# Patient Record
Sex: Male | Born: 1955 | Race: Black or African American | Hispanic: No | Smoking: Former smoker
Health system: Southern US, Community
[De-identification: ages and names within clinical notes are randomized; demographics above are authoritative.]

## PROBLEM LIST (undated history)

## (undated) DIAGNOSIS — Z992 Dependence on renal dialysis: Secondary | ICD-10-CM

## (undated) DIAGNOSIS — E669 Obesity, unspecified: Secondary | ICD-10-CM

## (undated) DIAGNOSIS — E119 Type 2 diabetes mellitus without complications: Secondary | ICD-10-CM

## (undated) DIAGNOSIS — I10 Essential (primary) hypertension: Secondary | ICD-10-CM

## (undated) DIAGNOSIS — F32A Depression, unspecified: Secondary | ICD-10-CM

## (undated) DIAGNOSIS — H3581 Retinal edema: Secondary | ICD-10-CM

## (undated) DIAGNOSIS — M1990 Unspecified osteoarthritis, unspecified site: Secondary | ICD-10-CM

## (undated) DIAGNOSIS — I2510 Atherosclerotic heart disease of native coronary artery without angina pectoris: Secondary | ICD-10-CM

## (undated) DIAGNOSIS — J45909 Unspecified asthma, uncomplicated: Secondary | ICD-10-CM

## (undated) DIAGNOSIS — R51 Headache: Secondary | ICD-10-CM

## (undated) DIAGNOSIS — N186 End stage renal disease: Secondary | ICD-10-CM

## (undated) DIAGNOSIS — F419 Anxiety disorder, unspecified: Secondary | ICD-10-CM

## (undated) DIAGNOSIS — E785 Hyperlipidemia, unspecified: Secondary | ICD-10-CM

## (undated) DIAGNOSIS — I639 Cerebral infarction, unspecified: Secondary | ICD-10-CM

## (undated) DIAGNOSIS — G4730 Sleep apnea, unspecified: Secondary | ICD-10-CM

## (undated) DIAGNOSIS — I219 Acute myocardial infarction, unspecified: Secondary | ICD-10-CM

## (undated) DIAGNOSIS — F329 Major depressive disorder, single episode, unspecified: Secondary | ICD-10-CM

## (undated) DIAGNOSIS — E11319 Type 2 diabetes mellitus with unspecified diabetic retinopathy without macular edema: Secondary | ICD-10-CM

## (undated) DIAGNOSIS — I5022 Chronic systolic (congestive) heart failure: Secondary | ICD-10-CM

## (undated) DIAGNOSIS — N184 Chronic kidney disease, stage 4 (severe): Secondary | ICD-10-CM

## (undated) HISTORY — PX: CORONARY ANGIOPLASTY WITH STENT PLACEMENT: SHX49

## (undated) HISTORY — DX: Acute myocardial infarction, unspecified: I21.9

## (undated) HISTORY — DX: Essential (primary) hypertension: I10

## (undated) HISTORY — DX: Atherosclerotic heart disease of native coronary artery without angina pectoris: I25.10

## (undated) HISTORY — DX: Hyperlipidemia, unspecified: E78.5

## (undated) HISTORY — PX: UMBILICAL HERNIA REPAIR: SHX196

## (undated) HISTORY — PX: REFRACTIVE SURGERY: SHX103

## (undated) HISTORY — DX: Cerebral infarction, unspecified: I63.9

## (undated) HISTORY — PX: HERNIA REPAIR: SHX51

---

## 1998-04-12 ENCOUNTER — Encounter: Admission: RE | Admit: 1998-04-12 | Discharge: 1998-07-11 | Payer: Self-pay | Admitting: Nephrology

## 1998-06-22 ENCOUNTER — Encounter: Payer: Self-pay | Admitting: Family Medicine

## 1998-06-22 ENCOUNTER — Inpatient Hospital Stay (HOSPITAL_COMMUNITY): Admission: EM | Admit: 1998-06-22 | Discharge: 1998-06-24 | Payer: Self-pay | Admitting: Emergency Medicine

## 1998-06-29 ENCOUNTER — Encounter: Admission: RE | Admit: 1998-06-29 | Discharge: 1998-06-29 | Payer: Self-pay | Admitting: Family Medicine

## 1998-07-14 ENCOUNTER — Encounter: Admission: RE | Admit: 1998-07-14 | Discharge: 1998-10-12 | Payer: Self-pay | Admitting: Nephrology

## 2002-05-21 ENCOUNTER — Ambulatory Visit (HOSPITAL_BASED_OUTPATIENT_CLINIC_OR_DEPARTMENT_OTHER): Admission: RE | Admit: 2002-05-21 | Discharge: 2002-05-21 | Payer: Self-pay | Admitting: Emergency Medicine

## 2004-01-23 ENCOUNTER — Inpatient Hospital Stay (HOSPITAL_COMMUNITY): Admission: EM | Admit: 2004-01-23 | Discharge: 2004-01-29 | Payer: Self-pay | Admitting: Emergency Medicine

## 2004-01-24 HISTORY — PX: CORONARY ANGIOPLASTY WITH STENT PLACEMENT: SHX49

## 2004-01-28 HISTORY — PX: CORONARY ANGIOPLASTY WITH STENT PLACEMENT: SHX49

## 2004-11-17 ENCOUNTER — Emergency Department (HOSPITAL_COMMUNITY): Admission: EM | Admit: 2004-11-17 | Discharge: 2004-11-17 | Payer: Self-pay | Admitting: Family Medicine

## 2004-12-14 ENCOUNTER — Emergency Department (HOSPITAL_COMMUNITY): Admission: EM | Admit: 2004-12-14 | Discharge: 2004-12-14 | Payer: Self-pay | Admitting: Family Medicine

## 2006-06-10 ENCOUNTER — Emergency Department (HOSPITAL_COMMUNITY): Admission: EM | Admit: 2006-06-10 | Discharge: 2006-06-10 | Payer: Self-pay | Admitting: Emergency Medicine

## 2006-06-20 ENCOUNTER — Emergency Department (HOSPITAL_COMMUNITY): Admission: EM | Admit: 2006-06-20 | Discharge: 2006-06-20 | Payer: Self-pay | Admitting: Family Medicine

## 2006-12-12 ENCOUNTER — Ambulatory Visit (HOSPITAL_COMMUNITY): Admission: RE | Admit: 2006-12-12 | Discharge: 2006-12-12 | Payer: Self-pay | Admitting: Nephrology

## 2007-05-19 ENCOUNTER — Ambulatory Visit: Payer: Self-pay | Admitting: Family Medicine

## 2007-05-19 ENCOUNTER — Inpatient Hospital Stay (HOSPITAL_COMMUNITY): Admission: EM | Admit: 2007-05-19 | Discharge: 2007-05-21 | Payer: Self-pay | Admitting: Emergency Medicine

## 2007-05-20 ENCOUNTER — Encounter: Payer: Self-pay | Admitting: Family Medicine

## 2007-10-06 ENCOUNTER — Ambulatory Visit: Payer: Self-pay | Admitting: Family Medicine

## 2007-10-06 ENCOUNTER — Observation Stay (HOSPITAL_COMMUNITY): Admission: EM | Admit: 2007-10-06 | Discharge: 2007-10-08 | Payer: Self-pay | Admitting: Family Medicine

## 2007-12-19 ENCOUNTER — Ambulatory Visit (HOSPITAL_COMMUNITY): Admission: RE | Admit: 2007-12-19 | Discharge: 2007-12-19 | Payer: Self-pay | Admitting: Gastroenterology

## 2008-01-12 ENCOUNTER — Ambulatory Visit (HOSPITAL_COMMUNITY): Admission: RE | Admit: 2008-01-12 | Discharge: 2008-01-12 | Payer: Self-pay | Admitting: General Surgery

## 2008-09-13 ENCOUNTER — Observation Stay (HOSPITAL_COMMUNITY): Admission: EM | Admit: 2008-09-13 | Discharge: 2008-09-14 | Payer: Self-pay | Admitting: Emergency Medicine

## 2008-09-13 ENCOUNTER — Ambulatory Visit: Payer: Self-pay | Admitting: Family Medicine

## 2008-09-21 ENCOUNTER — Encounter: Payer: Self-pay | Admitting: *Deleted

## 2008-09-21 ENCOUNTER — Ambulatory Visit: Payer: Self-pay | Admitting: Family Medicine

## 2008-09-21 DIAGNOSIS — I10 Essential (primary) hypertension: Secondary | ICD-10-CM | POA: Insufficient documentation

## 2008-09-21 DIAGNOSIS — F528 Other sexual dysfunction not due to a substance or known physiological condition: Secondary | ICD-10-CM

## 2008-09-21 DIAGNOSIS — I2510 Atherosclerotic heart disease of native coronary artery without angina pectoris: Secondary | ICD-10-CM

## 2008-09-21 DIAGNOSIS — E785 Hyperlipidemia, unspecified: Secondary | ICD-10-CM

## 2008-09-21 DIAGNOSIS — G4733 Obstructive sleep apnea (adult) (pediatric): Secondary | ICD-10-CM

## 2008-09-21 DIAGNOSIS — E118 Type 2 diabetes mellitus with unspecified complications: Secondary | ICD-10-CM | POA: Insufficient documentation

## 2008-09-21 DIAGNOSIS — J13 Pneumonia due to Streptococcus pneumoniae: Secondary | ICD-10-CM | POA: Insufficient documentation

## 2008-09-21 DIAGNOSIS — I5042 Chronic combined systolic (congestive) and diastolic (congestive) heart failure: Secondary | ICD-10-CM | POA: Insufficient documentation

## 2008-09-21 DIAGNOSIS — I5022 Chronic systolic (congestive) heart failure: Secondary | ICD-10-CM

## 2008-09-21 HISTORY — DX: Chronic systolic (congestive) heart failure: I50.22

## 2009-01-12 IMAGING — CR DG CHEST 2V
3 series · 3 of 3 positions shown · non-contrast
Comparison: 06/20/06.

CLINICAL DATA: Stroke, weakness. 
 CHEST ? 2 VIEW:

[view not recorded (1 of 3)]
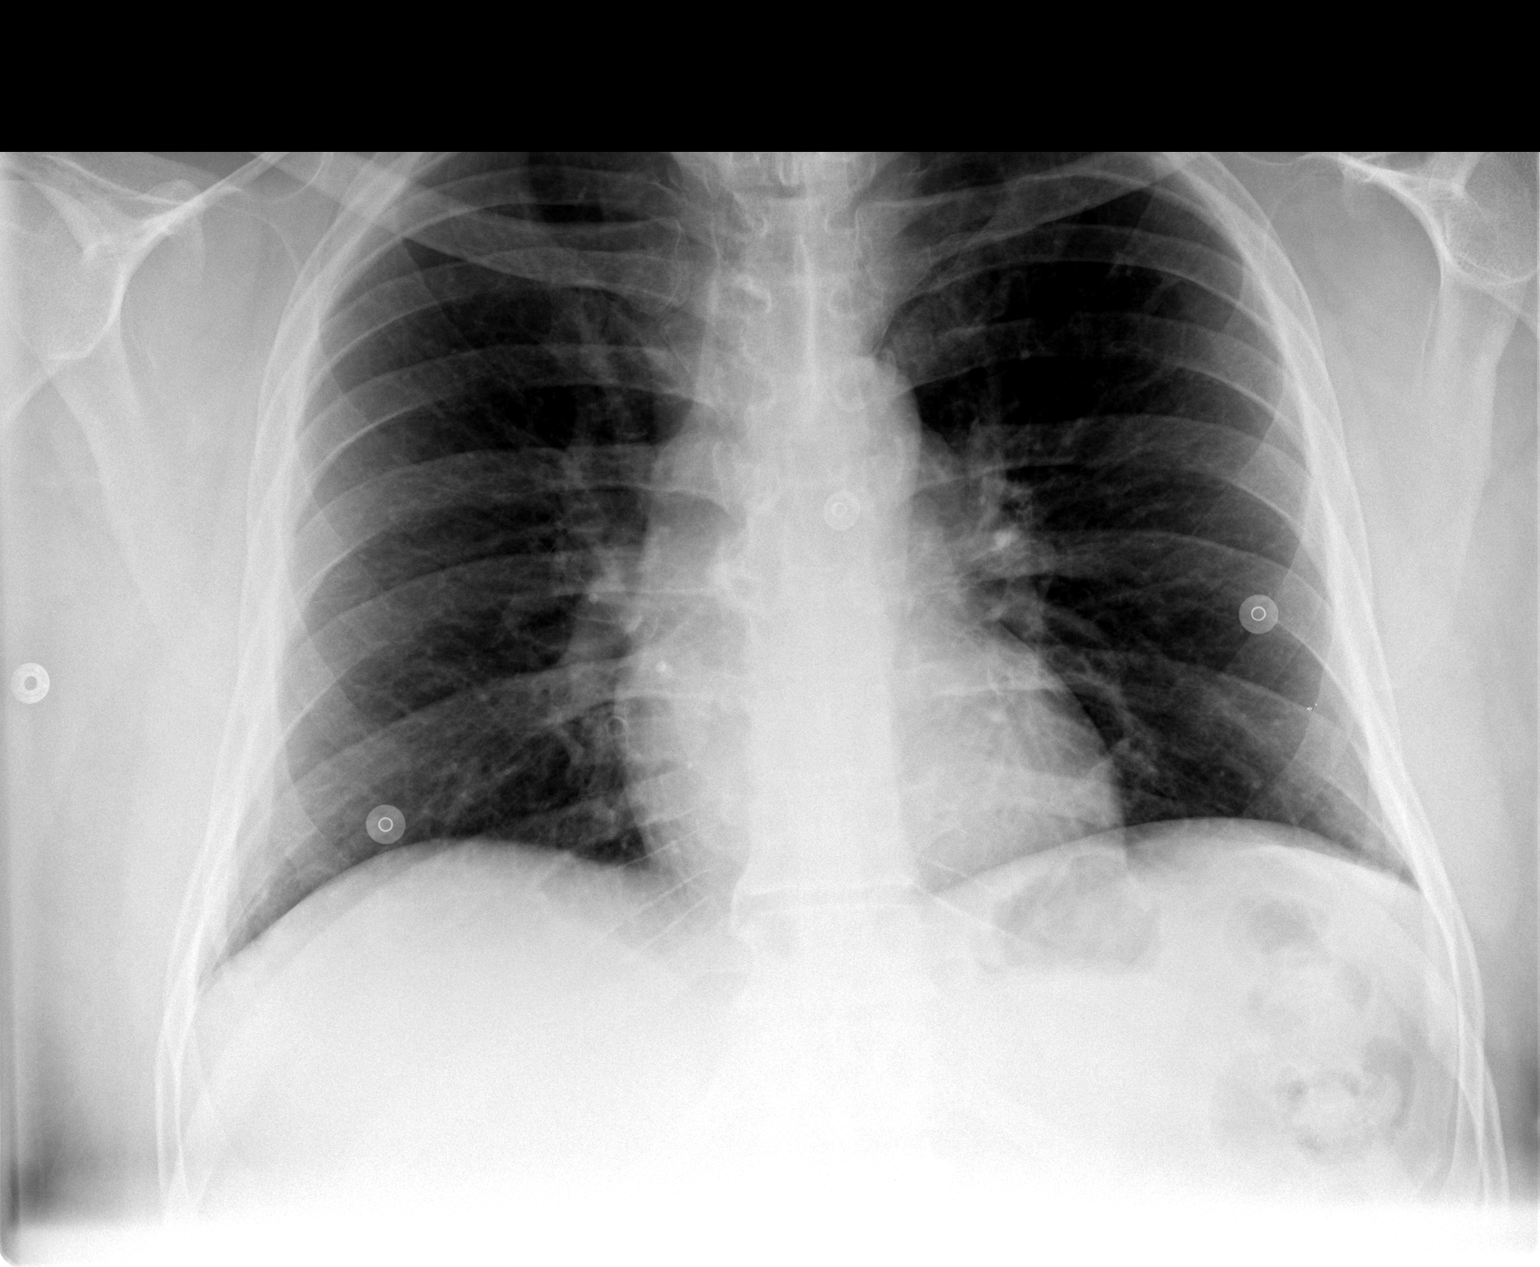

[view not recorded (2 of 3)]
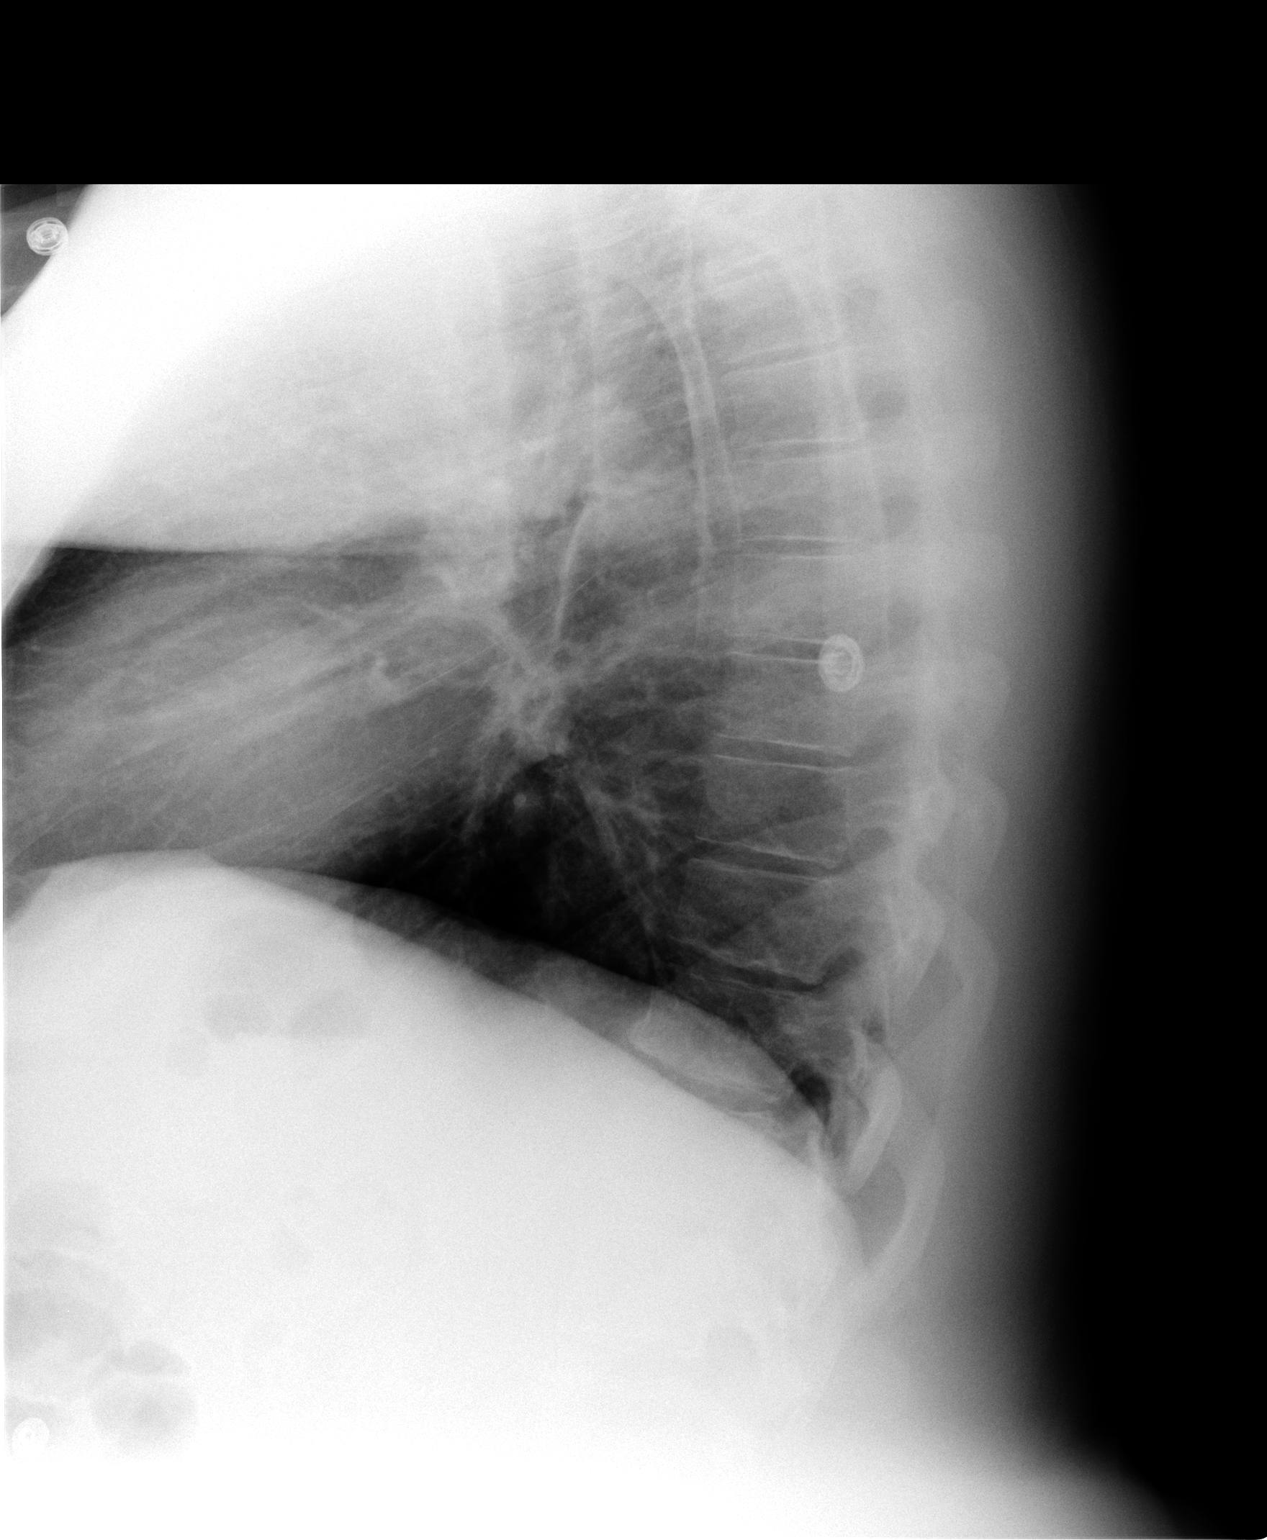

[view not recorded (3 of 3)]
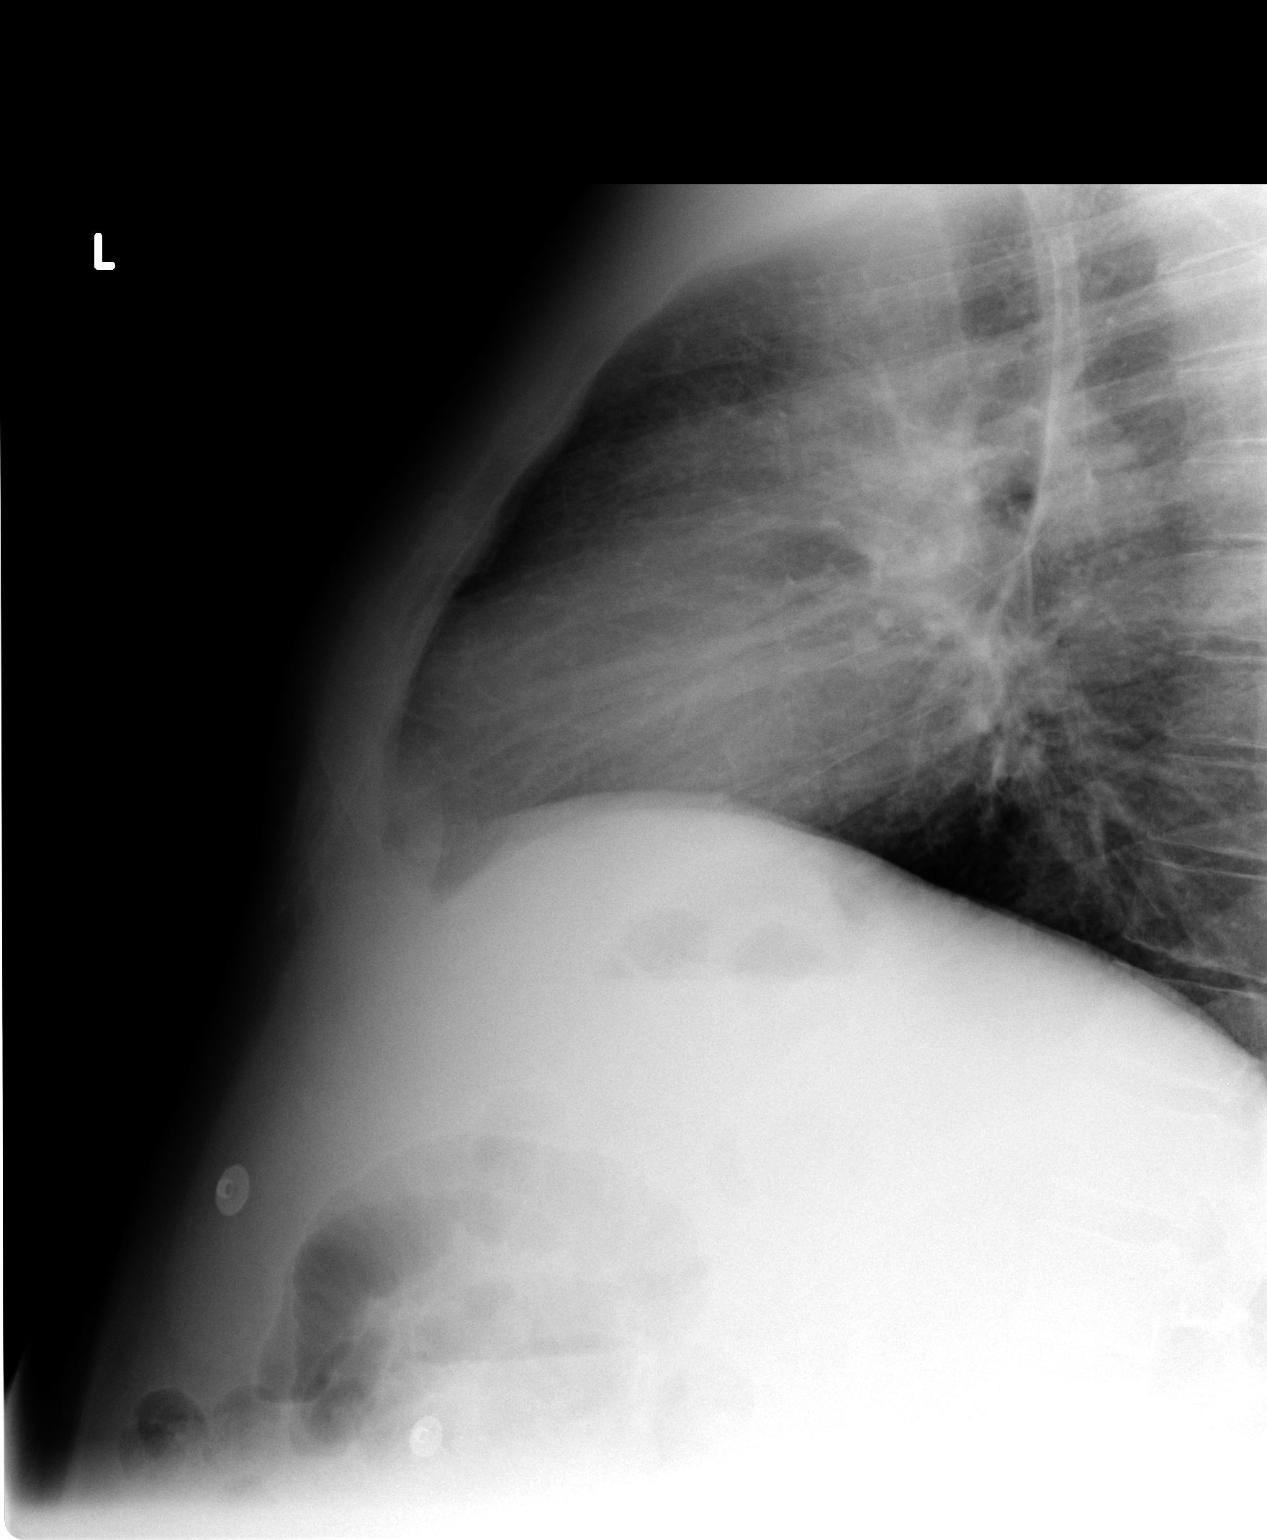

[3 of 3 positions shown; findings below may reference images not displayed]

FINDINGS: The heart size and mediastinal contours are within normal limits.  Both lungs are clear.  The visualized skeletal structures are unremarkable.
IMPRESSION: No active cardiopulmonary disease.

## 2009-04-11 ENCOUNTER — Inpatient Hospital Stay (HOSPITAL_COMMUNITY): Admission: EM | Admit: 2009-04-11 | Discharge: 2009-04-15 | Payer: Self-pay | Admitting: Emergency Medicine

## 2009-04-11 ENCOUNTER — Emergency Department (HOSPITAL_COMMUNITY): Admission: EM | Admit: 2009-04-11 | Discharge: 2009-04-11 | Payer: Self-pay | Admitting: Family Medicine

## 2009-04-11 ENCOUNTER — Encounter: Payer: Self-pay | Admitting: Family Medicine

## 2009-04-11 ENCOUNTER — Ambulatory Visit: Payer: Self-pay | Admitting: Family Medicine

## 2009-04-11 DIAGNOSIS — I6350 Cerebral infarction due to unspecified occlusion or stenosis of unspecified cerebral artery: Secondary | ICD-10-CM | POA: Insufficient documentation

## 2009-04-11 DIAGNOSIS — R519 Headache, unspecified: Secondary | ICD-10-CM | POA: Insufficient documentation

## 2009-04-11 DIAGNOSIS — H538 Other visual disturbances: Secondary | ICD-10-CM

## 2009-04-11 DIAGNOSIS — R51 Headache: Secondary | ICD-10-CM

## 2009-04-12 ENCOUNTER — Encounter: Payer: Self-pay | Admitting: Family Medicine

## 2009-04-14 ENCOUNTER — Encounter: Payer: Self-pay | Admitting: Cardiovascular Disease

## 2009-04-25 ENCOUNTER — Ambulatory Visit: Payer: Self-pay | Admitting: Family Medicine

## 2009-05-10 ENCOUNTER — Ambulatory Visit: Payer: Self-pay | Admitting: Family Medicine

## 2009-05-10 ENCOUNTER — Encounter: Payer: Self-pay | Admitting: Family Medicine

## 2009-05-12 LAB — CONVERTED CEMR LAB
AST: 14 units/L (ref 0–37)
Alkaline Phosphatase: 68 units/L (ref 39–117)
BUN: 26 mg/dL — ABNORMAL HIGH (ref 6–23)
Calcium: 9.9 mg/dL (ref 8.4–10.5)
Chloride: 100 meq/L (ref 96–112)
Creatinine, Ser: 1.97 mg/dL — ABNORMAL HIGH (ref 0.40–1.50)
HDL: 43 mg/dL (ref >39)
Total CHOL/HDL Ratio: 6.9

## 2009-06-07 ENCOUNTER — Encounter: Payer: Self-pay | Admitting: Family Medicine

## 2009-06-07 ENCOUNTER — Ambulatory Visit: Payer: Self-pay | Admitting: Family Medicine

## 2009-06-07 DIAGNOSIS — R471 Dysarthria and anarthria: Secondary | ICD-10-CM

## 2009-06-09 ENCOUNTER — Telehealth: Payer: Self-pay | Admitting: Family Medicine

## 2009-06-09 LAB — CONVERTED CEMR LAB
Calcium: 9.9 mg/dL (ref 8.4–10.5)
Glucose, Bld: 233 mg/dL — ABNORMAL HIGH (ref 70–99)
Sodium: 142 meq/L (ref 135–145)

## 2009-06-16 ENCOUNTER — Ambulatory Visit: Payer: Self-pay | Admitting: Family Medicine

## 2009-07-07 ENCOUNTER — Ambulatory Visit: Payer: Self-pay | Admitting: Family Medicine

## 2009-07-11 ENCOUNTER — Ambulatory Visit: Payer: Self-pay | Admitting: Family Medicine

## 2009-07-11 ENCOUNTER — Encounter: Payer: Self-pay | Admitting: Family Medicine

## 2009-07-11 LAB — CONVERTED CEMR LAB
ALT: 15 units/L (ref 0–53)
AST: 13 units/L (ref 0–37)
Alkaline Phosphatase: 64 units/L (ref 39–117)
CO2: 24 meq/L (ref 19–32)
Sodium: 140 meq/L (ref 135–145)
Total Bilirubin: 0.4 mg/dL (ref 0.3–1.2)
Total Protein: 6.9 g/dL (ref 6.0–8.3)

## 2009-07-14 ENCOUNTER — Encounter: Admission: RE | Admit: 2009-07-14 | Discharge: 2009-09-20 | Payer: Self-pay | Admitting: Family Medicine

## 2009-07-15 ENCOUNTER — Telehealth: Payer: Self-pay | Admitting: *Deleted

## 2009-08-02 ENCOUNTER — Ambulatory Visit: Payer: Self-pay | Admitting: Family Medicine

## 2009-08-02 ENCOUNTER — Encounter: Payer: Self-pay | Admitting: Family Medicine

## 2009-08-02 DIAGNOSIS — M25519 Pain in unspecified shoulder: Secondary | ICD-10-CM

## 2009-08-04 LAB — CONVERTED CEMR LAB
BUN: 22 mg/dL (ref 6–23)
Calcium: 10 mg/dL (ref 8.4–10.5)
Creatinine, Ser: 1.85 mg/dL — ABNORMAL HIGH (ref 0.40–1.50)
Glucose, Bld: 258 mg/dL — ABNORMAL HIGH (ref 70–99)
Sodium: 138 meq/L (ref 135–145)

## 2009-08-30 ENCOUNTER — Ambulatory Visit: Payer: Self-pay | Admitting: Family Medicine

## 2009-08-30 ENCOUNTER — Encounter: Payer: Self-pay | Admitting: Family Medicine

## 2009-08-30 DIAGNOSIS — Z8679 Personal history of other diseases of the circulatory system: Secondary | ICD-10-CM | POA: Insufficient documentation

## 2009-08-30 DIAGNOSIS — N184 Chronic kidney disease, stage 4 (severe): Secondary | ICD-10-CM

## 2009-08-30 DIAGNOSIS — N186 End stage renal disease: Secondary | ICD-10-CM | POA: Insufficient documentation

## 2009-08-30 DIAGNOSIS — Z992 Dependence on renal dialysis: Secondary | ICD-10-CM

## 2009-08-30 DIAGNOSIS — I639 Cerebral infarction, unspecified: Secondary | ICD-10-CM | POA: Insufficient documentation

## 2009-08-30 HISTORY — DX: Chronic kidney disease, stage 4 (severe): N18.4

## 2009-08-30 LAB — CONVERTED CEMR LAB: Hgb A1c MFr Bld: 11.4 %

## 2009-09-01 LAB — CONVERTED CEMR LAB
Calcium: 9.3 mg/dL (ref 8.4–10.5)
Creatinine, Ser: 1.97 mg/dL — ABNORMAL HIGH (ref 0.40–1.50)

## 2009-09-06 ENCOUNTER — Ambulatory Visit: Payer: Self-pay | Admitting: Family Medicine

## 2009-09-06 ENCOUNTER — Telehealth: Payer: Self-pay | Admitting: Family Medicine

## 2009-09-29 ENCOUNTER — Encounter: Payer: Self-pay | Admitting: Family Medicine

## 2009-10-11 ENCOUNTER — Ambulatory Visit: Payer: Self-pay | Admitting: Family Medicine

## 2009-10-12 ENCOUNTER — Telehealth (INDEPENDENT_AMBULATORY_CARE_PROVIDER_SITE_OTHER): Payer: Self-pay | Admitting: *Deleted

## 2009-10-28 ENCOUNTER — Ambulatory Visit: Payer: Self-pay | Admitting: Family Medicine

## 2009-10-28 ENCOUNTER — Telehealth: Payer: Self-pay | Admitting: Family Medicine

## 2009-10-28 ENCOUNTER — Encounter: Payer: Self-pay | Admitting: Family Medicine

## 2009-10-29 LAB — CONVERTED CEMR LAB
Chloride: 108 meq/L (ref 96–112)
Potassium: 3.9 meq/L (ref 3.5–5.3)
Sodium: 142 meq/L (ref 135–145)

## 2009-10-31 ENCOUNTER — Encounter: Payer: Self-pay | Admitting: Family Medicine

## 2009-10-31 ENCOUNTER — Ambulatory Visit: Payer: Self-pay | Admitting: Family Medicine

## 2009-11-01 LAB — CONVERTED CEMR LAB
AST: 16 units/L (ref 0–37)
Albumin: 3.6 g/dL (ref 3.5–5.2)
BUN: 33 mg/dL — ABNORMAL HIGH (ref 6–23)
Calcium: 9.2 mg/dL (ref 8.4–10.5)
Chloride: 102 meq/L (ref 96–112)
Glucose, Bld: 174 mg/dL — ABNORMAL HIGH (ref 70–99)
HDL: 46 mg/dL (ref >39)
Potassium: 3.6 meq/L (ref 3.5–5.3)
Sodium: 140 meq/L (ref 135–145)
Total Protein: 7.1 g/dL (ref 6.0–8.3)
Triglycerides: 289 mg/dL — ABNORMAL HIGH (ref <150)

## 2009-11-03 ENCOUNTER — Ambulatory Visit: Payer: Self-pay | Admitting: Family Medicine

## 2009-11-03 ENCOUNTER — Encounter: Payer: Self-pay | Admitting: Family Medicine

## 2009-11-04 LAB — CONVERTED CEMR LAB
CO2: 28 meq/L (ref 19–32)
Calcium: 9.2 mg/dL (ref 8.4–10.5)
Chloride: 106 meq/L (ref 96–112)
Glucose, Bld: 115 mg/dL — ABNORMAL HIGH (ref 70–99)
Sodium: 142 meq/L (ref 135–145)

## 2009-11-07 ENCOUNTER — Ambulatory Visit: Payer: Self-pay | Admitting: Family Medicine

## 2009-11-07 ENCOUNTER — Encounter: Payer: Self-pay | Admitting: Family Medicine

## 2009-11-08 LAB — CONVERTED CEMR LAB
BUN: 20 mg/dL (ref 6–23)
CO2: 28 meq/L (ref 19–32)
Calcium: 9.4 mg/dL (ref 8.4–10.5)
Chloride: 104 meq/L (ref 96–112)
Creatinine, Ser: 1.68 mg/dL — ABNORMAL HIGH (ref 0.40–1.50)

## 2009-11-11 ENCOUNTER — Encounter: Payer: Self-pay | Admitting: Family Medicine

## 2009-11-11 ENCOUNTER — Telehealth: Payer: Self-pay | Admitting: Family Medicine

## 2009-11-11 ENCOUNTER — Ambulatory Visit: Payer: Self-pay | Admitting: Family Medicine

## 2009-11-11 LAB — CONVERTED CEMR LAB
BUN: 30 mg/dL — ABNORMAL HIGH (ref 6–23)
Calcium: 10.2 mg/dL (ref 8.4–10.5)
Chloride: 100 meq/L (ref 96–112)
Creatinine, Ser: 1.63 mg/dL — ABNORMAL HIGH (ref 0.40–1.50)

## 2010-01-09 ENCOUNTER — Encounter: Payer: Self-pay | Admitting: Family Medicine

## 2010-01-09 ENCOUNTER — Ambulatory Visit: Payer: Self-pay | Admitting: Family Medicine

## 2010-01-09 DIAGNOSIS — L600 Ingrowing nail: Secondary | ICD-10-CM | POA: Insufficient documentation

## 2010-01-09 DIAGNOSIS — E1149 Type 2 diabetes mellitus with other diabetic neurological complication: Secondary | ICD-10-CM

## 2010-01-09 LAB — CONVERTED CEMR LAB
ALT: 19 units/L (ref 0–53)
AST: 15 units/L (ref 0–37)
Albumin: 4 g/dL (ref 3.5–5.2)
Alkaline Phosphatase: 75 units/L (ref 39–117)
Glucose, Bld: 112 mg/dL — ABNORMAL HIGH (ref 70–99)
Hgb A1c MFr Bld: 9 %
Potassium: 4.1 meq/L (ref 3.5–5.3)
Sodium: 141 meq/L (ref 135–145)
Total Bilirubin: 0.3 mg/dL (ref 0.3–1.2)
Total Protein: 7.7 g/dL (ref 6.0–8.3)

## 2010-01-16 ENCOUNTER — Emergency Department (HOSPITAL_COMMUNITY): Admission: EM | Admit: 2010-01-16 | Discharge: 2010-01-16 | Payer: Self-pay | Admitting: Emergency Medicine

## 2010-01-19 ENCOUNTER — Encounter: Payer: Self-pay | Admitting: Family Medicine

## 2010-01-23 ENCOUNTER — Ambulatory Visit: Payer: Self-pay | Admitting: Family Medicine

## 2010-02-03 ENCOUNTER — Encounter: Payer: Self-pay | Admitting: Family Medicine

## 2010-02-07 ENCOUNTER — Inpatient Hospital Stay (HOSPITAL_COMMUNITY): Admission: EM | Admit: 2010-02-07 | Discharge: 2010-02-16 | Payer: Self-pay | Admitting: Emergency Medicine

## 2010-02-07 ENCOUNTER — Ambulatory Visit: Payer: Self-pay | Admitting: Family Medicine

## 2010-02-07 ENCOUNTER — Emergency Department (HOSPITAL_COMMUNITY): Admission: EM | Admit: 2010-02-07 | Discharge: 2010-02-07 | Payer: Self-pay | Admitting: Family Medicine

## 2010-02-16 ENCOUNTER — Telehealth: Payer: Self-pay | Admitting: *Deleted

## 2010-02-21 ENCOUNTER — Encounter: Payer: Self-pay | Admitting: Family Medicine

## 2010-02-22 ENCOUNTER — Encounter: Payer: Self-pay | Admitting: Family Medicine

## 2010-02-22 ENCOUNTER — Ambulatory Visit: Payer: Self-pay | Admitting: Family Medicine

## 2010-02-23 ENCOUNTER — Encounter: Payer: Self-pay | Admitting: Family Medicine

## 2010-03-13 ENCOUNTER — Encounter: Payer: Self-pay | Admitting: Family Medicine

## 2010-03-17 ENCOUNTER — Telehealth: Payer: Self-pay | Admitting: Family Medicine

## 2010-03-31 ENCOUNTER — Encounter: Payer: Self-pay | Admitting: Family Medicine

## 2010-04-10 ENCOUNTER — Encounter: Payer: Self-pay | Admitting: Family Medicine

## 2010-04-10 ENCOUNTER — Ambulatory Visit (HOSPITAL_BASED_OUTPATIENT_CLINIC_OR_DEPARTMENT_OTHER): Admission: RE | Admit: 2010-04-10 | Discharge: 2010-04-10 | Payer: Self-pay | Admitting: Family Medicine

## 2010-04-15 ENCOUNTER — Ambulatory Visit: Payer: Self-pay | Admitting: Internal Medicine

## 2010-05-31 ENCOUNTER — Telehealth: Payer: Self-pay | Admitting: Family Medicine

## 2010-06-26 ENCOUNTER — Encounter: Payer: Self-pay | Admitting: Family Medicine

## 2010-06-26 ENCOUNTER — Ambulatory Visit: Payer: Self-pay | Admitting: Family Medicine

## 2010-06-26 DIAGNOSIS — R0602 Shortness of breath: Secondary | ICD-10-CM

## 2010-06-26 DIAGNOSIS — R42 Dizziness and giddiness: Secondary | ICD-10-CM

## 2010-06-26 LAB — CONVERTED CEMR LAB
BUN: 52 mg/dL — ABNORMAL HIGH (ref 6–23)
CO2: 25 meq/L (ref 19–32)
Calcium: 9.7 mg/dL (ref 8.4–10.5)
Creatinine, Ser: 3.37 mg/dL — ABNORMAL HIGH (ref 0.40–1.50)
Glucose, Bld: 458 mg/dL — ABNORMAL HIGH (ref 70–99)
Pro B Natriuretic peptide (BNP): 3.4 pg/mL (ref 0.0–100.0)
Sodium: 130 meq/L — ABNORMAL LOW (ref 135–145)

## 2010-06-30 ENCOUNTER — Encounter: Payer: Self-pay | Admitting: Family Medicine

## 2010-06-30 LAB — CONVERTED CEMR LAB
BUN: 37 mg/dL — ABNORMAL HIGH (ref 6–23)
Calcium: 10.2 mg/dL (ref 8.4–10.5)
Chloride: 100 meq/L (ref 96–112)
Creatinine, Ser: 2.16 mg/dL — ABNORMAL HIGH (ref 0.40–1.50)

## 2010-07-06 ENCOUNTER — Encounter: Payer: Self-pay | Admitting: Family Medicine

## 2010-07-10 ENCOUNTER — Ambulatory Visit: Payer: Self-pay | Admitting: Family Medicine

## 2010-07-14 ENCOUNTER — Encounter: Payer: Self-pay | Admitting: Family Medicine

## 2010-07-26 ENCOUNTER — Telehealth: Payer: Self-pay | Admitting: Family Medicine

## 2010-08-02 ENCOUNTER — Telehealth: Payer: Self-pay | Admitting: Family Medicine

## 2010-08-09 ENCOUNTER — Encounter: Payer: Self-pay | Admitting: Family Medicine

## 2010-08-09 ENCOUNTER — Ambulatory Visit: Payer: Self-pay | Admitting: Family Medicine

## 2010-08-09 LAB — CONVERTED CEMR LAB
BUN: 16 mg/dL (ref 6–23)
Chloride: 107 meq/L (ref 96–112)
Potassium: 3.8 meq/L (ref 3.5–5.3)
Pro B Natriuretic peptide (BNP): 29.6 pg/mL (ref 0.0–100.0)

## 2010-08-10 ENCOUNTER — Encounter: Payer: Self-pay | Admitting: Internal Medicine

## 2010-08-10 ENCOUNTER — Ambulatory Visit: Payer: Self-pay | Admitting: Family Medicine

## 2010-08-11 ENCOUNTER — Ambulatory Visit: Payer: Self-pay | Admitting: Family Medicine

## 2010-08-14 ENCOUNTER — Ambulatory Visit: Payer: Self-pay | Admitting: Family Medicine

## 2010-08-14 ENCOUNTER — Encounter: Payer: Self-pay | Admitting: Family Medicine

## 2010-08-14 LAB — CONVERTED CEMR LAB
CO2: 27 meq/L (ref 19–32)
Calcium: 9.2 mg/dL (ref 8.4–10.5)
Potassium: 3.8 meq/L (ref 3.5–5.3)
Sodium: 138 meq/L (ref 135–145)

## 2010-08-30 ENCOUNTER — Encounter: Payer: Self-pay | Admitting: Family Medicine

## 2010-08-30 ENCOUNTER — Ambulatory Visit: Payer: Self-pay | Admitting: Family Medicine

## 2010-09-01 LAB — CONVERTED CEMR LAB
CO2: 24 meq/L (ref 19–32)
Chloride: 99 meq/L (ref 96–112)
Glucose, Bld: 331 mg/dL — ABNORMAL HIGH (ref 70–99)
Sodium: 136 meq/L (ref 135–145)

## 2010-10-05 ENCOUNTER — Encounter: Payer: Self-pay | Admitting: Family Medicine

## 2010-10-24 NOTE — Assessment & Plan Note (Signed)
Summary: f/u visit/b mc   Vital Signs:  Patient profile:   55 year old male Height:      70 inches Weight:      310 pounds BMI:     44.64 Temp:     98.1 degrees F oral Pulse rate:   102 / minute BP sitting:   135 / 79  (left arm) Cuff size:   large  Vitals Entered By: Schuyler Amor CMA (Jan 23, 2010 4:06 PM) CC: F/U HTN, CHF  Is Patient Diabetic? Yes Pain Assessment Patient in pain? no        Primary Care Provider:  Mariana Arn  MD  CC:  F/U HTN and CHF .  History of Present Illness: 1) CHF: Systolic. Was on torsemide, Coreg, Lisinopril, spironolactone at last visit, also started on hydralazine and isosorbide dinitrate. His cardiologist Dr. Georgina Peer at [facility] cut torsemide dose in half and stopped spironolactone due to elevated Cr at [facility]. Here at [facility] Cr was 2.01 in April (up from 1.63 in February 2011).  Reports dyspnea and orthopnea are at baseline, but has gained 10 lbs in past two weeks, reports increasing abdominal girth and fluid. Continues to not monitor salt intake or fluid intake. (was given recommendations for 1500 mg sodium, 1.5 liters fluid). Reports continued LE edema as well.    2) HTN: 135 / 26 today. Creatinine increased as above. Denies chest pain. Reports dyspnea, LE edema as above. Has cut back on fried foods, increased vegetables (now up to three per day). Does not watch salt.   Habits & Providers  Alcohol-Tobacco-Diet     Tobacco Status: quit  Current Medications (verified): 1)  Plavix 75 Mg Tabs (Clopidogrel Bisulfate) .... One Tablet By Mouth Dialy 2)  Torsemide 100 Mg Tabs (Torsemide) .... One Tabs Each Morning By Mouth 3)  Crestor 20 Mg Tabs (Rosuvastatin Calcium) .... One Tablet By Mouth Daily 4)  Glipizide 5 Mg Tabs (Glipizide) .... One Tab By Mouth Two Times A Day 5)  Lisinopril 40 Mg Tabs (Lisinopril) .... Take One Tablet By Mouth Daily 6)  Coreg 25 Mg Tabs (Carvedilol) .... One Tab By Mouth Two Times A Day 7)  Lantus 100 Unit/ml Soln (Insulin  Glargine) .... 70 Units Twice Daily.  Then Increase By 1 Unit in The Am and Pm If Morning Cbgs Is Still Higher Than 100.  Dispense 1 Month Supply Please 8)  Bd Insulin Syringe Ultrafine 30g X 1/2" 1 Ml Misc (Insulin Syringe-Needle U-100) .... Dispense One Month Supply For Once Daily Dosing.  Dispense 1 Box. 9)  Isosorbide Dinitrate 20 Mg Tabs (Isosorbide Dinitrate) .... One Tab By Mouth Three Times A Day 10)  Hydralazine Hcl 25 Mg Tabs (Hydralazine Hcl) .... One Tab By Mouth Three Times A Day 11)  Gabapentin 300 Mg Caps (Gabapentin) .... One Tab By Mouth Three Times A Day  Allergies: No Known Drug Allergies  Social History: Smoking Status:  quit  Physical Exam  General:  Obese, no acute distress. Not in extremis. VS reviewed. Hypertensive. Weight is up 10 lbs since last visit.  Lungs:  work of breathing mildly labored with movement. Unlabored at rest, clear to auscultation bilaterally; no wheezes, rales, or ronchi; good air movement throughout. specifically no rales BLL Heart:  Normal rate and regular rhythm. S1 and S2 normal without gallop, murmur, click, rub or other extra sounds. Abdomen:  quite distended, tight; no tenderness with palpation Pulses:  2+ pedals Extremities:  1+ pitting edema to mid shins  Neurologic:  alert & oriented X3.  cranial nerves II-XII intact.  sensation intact bilateral upper and lower extremities    Impression & Recommendations:  Problem # 1:  CONGESTIVE HEART FAILURE, SYSTOLIC DYSFUNCTION ([id number]) Assessment Deteriorated  Continue hydralazine and isosorbide dinitrate as below. Blood pressures approaching. Continue ACE, BBlocker. Will continue with half dose Torsemide. Reiterated need for tight fluid control, monitoring of salt intake; patient has been making little to no effort in this regard. Will contact patient's cardiologist regarding managing his medications. Plan is for patient to have labs at cardiology this week.    The following medications  were removed from the medication list:    Spironolactone 25 Mg Tabs (Spironolactone) ..... One tab by mouth qday His updated medication list for this problem includes:    Plavix 75 Mg Tabs (Clopidogrel bisulfate) ..... One tablet by mouth dialy    Torsemide 100 Mg Tabs (Torsemide) ..... One tabs each morning by mouth    Lisinopril 40 Mg Tabs (Lisinopril) .Marland Kitchen... Take one tablet by mouth daily    Coreg 25 Mg Tabs (Carvedilol) ..... One tab by mouth two times a day  Orders: [place]- Est  Level 4 [id number])  Problem # 2:  ESSENTIAL HYPERTENSION (ICD-401.9) Assessment: Unchanged  Readdressed need for DASH diet, exercise. Pressures much improved with starting hydralazine, isosorbide dinitrate. Will continue these. Will hold spironolactone, 1/2 dose torsemid as above.   The following medications were removed from the medication list:    Spironolactone 25 Mg Tabs (Spironolactone) ..... One tab by mouth qday His updated medication list for this problem includes:    Torsemide 100 Mg Tabs (Torsemide) ..... One tabs each morning by mouth    Lisinopril 40 Mg Tabs (Lisinopril) .Marland Kitchen... Take one tablet by mouth daily    Coreg 25 Mg Tabs (Carvedilol) ..... One tab by mouth two times a day    Hydralazine Hcl 25 Mg Tabs (Hydralazine hcl) ..... One tab by mouth three times a day  Orders: [place]- Est  Level 4 [id number])  Complete Medication List: 1)  Plavix 75 Mg Tabs (Clopidogrel bisulfate) .... One tablet by mouth dialy 2)  Torsemide 100 Mg Tabs (Torsemide) .... One tabs each morning by mouth 3)  Crestor 20 Mg Tabs (Rosuvastatin calcium) .... One tablet by mouth daily 4)  Glipizide 5 Mg Tabs (Glipizide) .... One tab by mouth two times a day 5)  Lisinopril 40 Mg Tabs (Lisinopril) .... Take one tablet by mouth daily 6)  Coreg 25 Mg Tabs (Carvedilol) .... One tab by mouth two times a day 7)  Lantus 100 Unit/ml Soln (Insulin glargine) .... 70 units twice daily.  then increase by 1 unit in the am and pm if morning cbgs is  still higher than 100.  dispense 1 month supply please 8)  Bd Insulin Syringe Ultrafine 30g X 1/2" 1 Ml Misc (Insulin syringe-needle u-100) .... Dispense one month supply for once daily dosing.  dispense 1 box. 9)  Isosorbide Dinitrate 20 Mg Tabs (Isosorbide dinitrate) .... One tab by mouth three times a day 10)  Hydralazine Hcl 25 Mg Tabs (Hydralazine hcl) .... One tab by mouth three times a day 11)  Gabapentin 300 Mg Caps (Gabapentin) .... One tab by mouth three times a day  Patient Instructions: 1)  Try to get exercise to help lose weight. 2)  limit your fluids to 1.5 liters per day 3)  stop soft drinks (these have sodium). Keep your sodium below 1500mg  daily (this is salt)  4)

## 2010-10-24 NOTE — Consult Note (Signed)
Summary: Jonathon Bailey   Imported By: Audie Clear 07/20/2010 12:16:36  _____________________________________________________________________  External Attachment:    Type:   Image     Comment:   External Document

## 2010-10-24 NOTE — Letter (Signed)
Summary: *Consult Note  [facility]  [address]   [place], [place] [address]   Phone: [phone]  Fax: [phone]    Re:    Jonathon Bailey DOB:    11/26/1955   Dear: Gae Bon, D.M.D., P.A.    Thank you for requesting that we see the above patient for pre-operative risk assessment.   Per the AAFP 2007 guidelines, given patient's prior h/o CAD s/p stent x 3 (2003) intermediate clinical predictors of pror MI (2003), compensated CHF, diabetes mellitus type 2, chronic renal insufficiency, and given patient's ability for moderate functional capacity and low-risk procedure, patient is at low-intermediate risk and is optimized at this time for events with the proposed procedure with IV sedation.   Recommendations: 1) Have patient discontinue Plavix 5 days prior to procedure.      After today's visit, the patients current medications include: 1)  PLAVIX 75 MG TABS (CLOPIDOGREL BISULFATE) one tablet by mouth dialy 2)  LASIX 80 MG TABS (FUROSEMIDE) 2 tab in the AM and 1 tab in PM 3)  CRESTOR 20 MG TABS (ROSUVASTATIN CALCIUM) one tablet by mouth daily 4)  GLIPIZIDE 5 MG TABS (GLIPIZIDE) one tab by mouth two times a day 5)  LISINOPRIL 40 MG TABS (LISINOPRIL) Take one tablet by mouth daily 6)  COREG 25 MG TABS (CARVEDILOL) One tab by mouth two times a day 7)  AMLODIPINE BESYLATE 10 MG TABS (AMLODIPINE BESYLATE) Take one tablet by mouth daily 8)  LANTUS 100 UNIT/ML SOLN (INSULIN GLARGINE) 15 units today then increase to 20 units in the AM  then increase by 2 units each morning if blood sugar is > 120.  Dispens 1 month supply 9)  BD INSULIN SYRINGE ULTRAFINE 30G X 1/2" 1 ML MISC (INSULIN SYRINGE-NEEDLE U-100) Dispense one month supply for once daily dosing.  Dispense 1 box.   Thank you for this consultation.  If you have any further questions regarding the care of this patient, please do not hesitate to contact me.  Thank you for this opportunity to look  after your patient.  Sincerely,   Mariana Arn  MD

## 2010-10-24 NOTE — Assessment & Plan Note (Signed)
Summary: f/up,tcb   Vital Signs:  Patient profile:   55 year old male Height:      70 inches Weight:      297 pounds BMI:     42.77 BSA:     2.47 Temp:     97.6 degrees F Pulse rate:   103 / minute BP sitting:   109 / 62  Vitals Entered By: Asher Muir (October  3, 624THL 10:00 AM) CC: not feeling good, dizzy,eyes blurred, edema, SOB, headache Is Patient Diabetic? Yes Pain Assessment Patient in pain? no        Primary Care Provider:  Mariana Arn  MD  CC:  not feeling good, dizzy, eyes blurred, edema, SOB, and headache.  History of Present Illness: 1) Dizziness: Reports vague dizziness (though not vertigo) x 2 weeks worse when standing up, somewhat better when lying flat, though not completely relieved. Reports generalized weakness. Reports blurry vision associated with these episodes of dizziness. Reports baseline dyspnea and LE edema, though he feels that his abdominal swelling is worse. Reports continued constipation only relieved by magnesium citrate. History significant for diastolic CHF, CRF with baseline Cr around 2.0, DM2 (reports hyperglycemia with sugars in 300's), CVA,   ROS: Denies chest pain, fever, chills, nausea, vomiting or diarrhea , slurring speech, focal weakness, orthopnea, cough,      see med rec for changes to medications.   Habits & Providers  Alcohol-Tobacco-Diet     Tobacco Status: quit     Year Quit: 25 yr  Current Medications (verified): 1)  Plavix 75 Mg Tabs (Clopidogrel Bisulfate) .... One Tablet By Mouth Dialy 2)  Torsemide 100 Mg Tabs (Torsemide) .... One Tabs Each Morning By Mouth 3)  Crestor 20 Mg Tabs (Rosuvastatin Calcium) .... One Tablet By Mouth Daily 4)  Coreg 25 Mg Tabs (Carvedilol) .... One Tab By Mouth Two Times A Day 5)  Lantus 100 Unit/ml Soln (Insulin Glargine) .... 50 Units Twice Daily.  Then Increase By 1 Unit in The Am and Pm If Morning Cbgs Is Still Higher Than 100.  Dispense 1 Month Supply Please 6)  Bd Insulin  Syringe Ultrafine 30g X 1/2" 1 Ml Misc (Insulin Syringe-Needle U-100) .... Dispense One Month Supply For Once Daily Dosing.  Dispense 1 Box. 7)  Isosorbide Dinitrate 20 Mg Tabs (Isosorbide Dinitrate) .... One Tab By Mouth Three Times A Day 8)  Hydralazine Hcl 25 Mg Tabs (Hydralazine Hcl) .... One Tab By Mouth Three Times A Day 9)  Gabapentin 300 Mg Caps (Gabapentin) .... One Tab By Mouth Three Times A Day 10)  Amlodipine Besylate 10 Mg Tabs (Amlodipine Besylate) .... One Tab By Mouth Qday 11)  Lisinopril 40 Mg Tabs (Lisinopril) .... One Tab By Mouth Qday 12)  Cpap 15 Cwp Ahi 0 Per Hour. .... Medium Resmed Mirage Quattro Full Face Mask W/ Heated Humidifier. Wear At Night Every Night  Allergies (verified): No Known Drug Allergies  Physical Exam  General:  obese, NAD, appears tired  Mouth:  Oral mucosa moist, fair dentition. Neck:  no JVD  Lungs:  CTAB w/o crackles,  Heart:  Normal rate and regular rhythm. S1 and S2 normal without gallop, murmur, click, rub or other extra sounds. Abdomen:  obese, somewhat distended, no tenderness.  No fluid wave palpated.  No masses.  Good bowel sounds.   Pulses:  2+ radials  Neurologic:  alert & oriented X3 and cranial nerves II-XII intact.  strength normal in all extremities and sensation intact to light touch.  Impression & Recommendations:  Problem # 1:  DIZZINESS (ICD-780.4) Assessment New  Uncertain etiology. Possibly related to hyperglycemia. EKG unchanged from previous (no acute ischemia noted). Will check BMET today. Close follow up this week given multiple risk factors.   Orders: [place]- Est Level  3 [id number])  Complete Medication List: 1)  Plavix 75 Mg Tabs (Clopidogrel bisulfate) .... One tablet by mouth dialy 2)  Torsemide 100 Mg Tabs (Torsemide) .... One tabs each morning by mouth 3)  Crestor 20 Mg Tabs (Rosuvastatin calcium) .... One tablet by mouth daily 4)  Coreg 25 Mg Tabs (Carvedilol) .... One tab by mouth two times a day 5)   Lantus 100 Unit/ml Soln (Insulin glargine) .... 50 units twice daily.  then increase by 1 unit in the am and pm if morning cbgs is still higher than 100.  dispense 1 month supply please 6)  Bd Insulin Syringe Ultrafine 30g X 1/2" 1 Ml Misc (Insulin syringe-needle u-100) .... Dispense one month supply for once daily dosing.  dispense 1 box. 7)  Isosorbide Dinitrate 20 Mg Tabs (Isosorbide dinitrate) .... One tab by mouth three times a day 8)  Hydralazine Hcl 25 Mg Tabs (Hydralazine hcl) .... One tab by mouth three times a day 9)  Gabapentin 300 Mg Caps (Gabapentin) .... One tab by mouth three times a day 10)  Amlodipine Besylate 10 Mg Tabs (Amlodipine besylate) .... One tab by mouth qday 11)  Lisinopril 40 Mg Tabs (Lisinopril) .... One tab by mouth qday 12)  Cpap 15 Cwp Ahi 0 Per Hour.  .... Medium resmed mirage quattro full face mask w/ heated humidifier. wear at night every night  Other Orders: A1C-[facility] [id number]) Glucose Cap-[facility] [id number]) 12 Lead EKG (12 Lead EKG) B Nat Peptide-[facility] [phone]) Basic Met-[facility] [phone])  Patient Instructions: 1)  Increase your Lantus to 60 units two times a day  2)  Follow up with me in one week.  3)  Get your CPAP machine from a medical supply store when your Medicaid comes through.  Prescriptions: CPAP 15 CWP AHI 0 PER HOUR. Medium ResMed Mirage Quattro full face mask w/ heated humidifier. Wear at night every night  #1 x 0   Entered and Authorized by:   Mariana Arn  MD   Signed by:   Mariana Arn  MD on 06/26/2010   Method used:   Print then Give to Patient   [id number] LISINOPRIL 40 MG TABS (LISINOPRIL) one tab by mouth qday  #30 x 1   Entered and Authorized by:   Mariana Arn  MD   Signed by:   Mariana Arn  MD on 06/26/2010   Method used:   Print then Give to Patient   RxIDUC:[id number] AMLODIPINE BESYLATE 10 MG TABS (AMLODIPINE BESYLATE) one tab by mouth qday  #30 x 3   Entered and Authorized by:   Mariana Arn  MD   Signed by:    Mariana Arn  MD on 06/26/2010   Method used:   Print then Give to Patient   RxIDIP:[id number] CRESTOR 20 MG TABS (ROSUVASTATIN CALCIUM) one tablet by mouth daily  #34 x 6   Entered and Authorized by:   Mariana Arn  MD   Signed by:   Mariana Arn  MD on 06/26/2010   Method used:   Print then Give to Patient   RxID:   [id number]   Laboratory Results   Blood Tests   Date/Time Received: June 26, 2010 11:43 AM  Date/Time Reported: June 26, 2010 12:31 PM   HGBA1C: 10.5%   (Normal Range: Non-Diabetic - 3-6%   Control Diabetic - 6-8%)  Comments: ...............test performed by......Marland KitchenBonnie A. Martinique, MLS (ASCP)cm

## 2010-10-24 NOTE — Progress Notes (Signed)
Summary: phn msg  Phone Note Call from Patient Call back at [phone] or [phone]   Caller: Roque Cash Nurse Case Manager Summary of Call: Pt was to go home with a C-Pap machine can we please schedule sleep study for patient and let them know when this will be.  And please call Ms Lovena Le when the study has been set up. Initial call taken by: Raymond Gurney,  Feb 16, 2010 12:25 PM

## 2010-10-24 NOTE — Assessment & Plan Note (Signed)
Summary: wt check/kh  Nurse Visit patient states breathing is about the same as yesterday. feels he has improved from 08/09/2010. his concern is that today is last day of HCTZ and he feels that it has helped. paged Dr. Sherilyn Cooter and he advises to refill  HCTZ 25 mg for one tab daily for 3 more days . . has F/U appointment on 08/14/2010. Marcell Barlow RN  August 11, 2010 9:30 AM   Vital Signs:  Patient profile:   55 year old male Weight:      314.9 pounds  Vitals Entered By: Marcell Barlow RN (August 11, 2010 9:28 AM)  Allergies: No Known Drug Allergies  Orders Added: 1)  No Charge Patient Arrived (NCPA0) [NCPA0]

## 2010-10-24 NOTE — Progress Notes (Signed)
Summary: Rx Prob  Phone Note Call from Patient Call back at Home Phone [phone]   Caller: Patient Summary of Call: Pt states that his Medicaid will not pay for his insulin due to dosage increase and that we need to let Medicaid know that it was increased so they will pay.  Pt uses [facility].   Initial call taken by: Raymond Gurney,  October 12, 2009 1:49 PM  Follow-up for Phone Call        spoke with pharmacist and he will call patient and try to straighten the problem. he does have the current new RX sent in yesterday. Follow-up by: Marcell Barlow RN,  October 12, 2009 3:16 PM

## 2010-10-24 NOTE — Assessment & Plan Note (Signed)
Summary: F/U  KH   Vital Signs:  Patient profile:   55 year old male Height:      70 inches Weight:      307.2 pounds BMI:     44.24 Temp:     98.1 degrees F oral Pulse rate:   97 / minute BP sitting:   175 / 80  (left arm) Cuff size:   large  Vitals Entered By: Levert Feinstein LPN (October 17, 624THL 9:04 AM) CC: f/u ARI, DM2  Is Patient Diabetic? Yes Did you bring your meter with you today? No Pain Assessment Patient in pain? no        Primary Care Provider:  Mariana Arn  MD  CC:  f/u ARI and DM2 .  History of Present Illness: 1) Acute on chronic renal insufficiency: Seen on 06/26/10 with complaints sof dizziness / lightheadedness and generalized weakness. BMET at that that time revealed an acute on chronic renal insufficiency (Cr over 3.0). Blood pressure at that time was 100's / 60's. Patient was advised to stop taking his torsemide and his lisinopril when labs became available. Was taking his torsemide 100 mg THREE times a day (states that his nephrologist told him to do so - though I can find no record of this in notes forwarded) and that he was taking his Coreg THREE  times a day (reports that his cardiologist told him to do so - again I can find no record of this). He self-discontinued all medications; I am in the process of slowly re-titrating all his medications - at last visit restarted his Coreg, Torsemide and Plavix.    2) DM2: Ran out of his insulin needles on Friday. Not taking insulin since then. Not checking blood sugars. Not following dietary reccomendations or exercising. Denies polyuria, polydipsia.   ROS: Denies chest pain, fever, chills, nausea, vomiting or diarrhea , slurring speech, focal weakness, orthopnea, cough,   Reports chronic constipation (relieved by magnesium citrate), dyspnea at baseline  Med rec - Coreg, Plavix, Torsemide as below. (OTHER MEDS ON HOLD)   Habits & Providers  Alcohol-Tobacco-Diet     Tobacco Status: quit     Year Quit: 25  yr  Current Medications (verified): 1)  Plavix 75 Mg Tabs (Clopidogrel Bisulfate) .... One Tablet By Mouth Dialy 2)  Torsemide 100 Mg Tabs (Torsemide) .... One Tabs Each Morning By Mouth 3)  Crestor 20 Mg Tabs (Rosuvastatin Calcium) .... One Tablet By Mouth Daily 4)  Coreg 25 Mg Tabs (Carvedilol) .... One Tab By Mouth Two Times A Day 5)  Lantus 100 Unit/ml Soln (Insulin Glargine) .... 50 Units Twice Daily.  Then Increase By 1 Unit in The Am and Pm If Morning Cbgs Is Still Higher Than 100.  Dispense 1 Month Supply Please 6)  Bd Insulin Syringe Ultrafine 30g X 1/2" 1 Ml Misc (Insulin Syringe-Needle U-100) .... Dispense One Month Supply For Once Daily Dosing.  Dispense 1 Box. 7)  Isosorbide Dinitrate 20 Mg Tabs (Isosorbide Dinitrate) .... One Tab By Mouth Three Times A Day 8)  Hydralazine Hcl 25 Mg Tabs (Hydralazine Hcl) .... One Tab By Mouth Three Times A Day 9)  Gabapentin 300 Mg Caps (Gabapentin) .... One Tab By Mouth Three Times A Day 10)  Amlodipine Besylate 10 Mg Tabs (Amlodipine Besylate) .... One Tab By Mouth Qday 11)  Lisinopril 40 Mg Tabs (Lisinopril) .... One Tab By Mouth Qday 12)  Cpap 15 Cwp Ahi 0 Per Hour. .... Medium Resmed Mirage Quattro Full Face Mask  W/ Heated Humidifier. Wear At Night Every Night  Allergies (verified): No Known Drug Allergies  Physical Exam  General:  obese, NAD, appears well today, vitals reviewed  Eyes:  No corneal or conjunctival inflammation noted. EOMI. Perrla. Funduscopic exam benign, without hemorrhages, exudates or papilledema. Vision grossly normal. Mouth:  Oral mucosa moist, fair dentition. Neck:  no JVD  Lungs:  CTAB w/o crackles,  Heart:  Normal rate and regular rhythm. S1 and S2 normal without gallop, murmur, click, rub or other extra sounds. Abdomen:  obese, somewhat distended, no tenderness.  No fluid wave palpated.  No masses.  Good bowel sounds.   Neurologic:  alert & oriented X3 and cranial nerves II-XII intact.  strength normal in all  extremities and sensation intact to light touch.     Impression & Recommendations:  Problem # 1:  RENAL DISEASE, CHRONIC (ICD-593.9) Assessment Unchanged  Acute on chronic renal insufficiency Likely secondary to relative hypotension / hypoperfusion with taking incorrect doses of his Coreg and torsemide. Now back to baseline. Will follow. Follow up with Nephrology as scheduled. Will send notes from recent visits to them.   Orders: [facility]- Est  Level 4 [id number])  Problem # 2:  DIABETES MELLITUS, TYPE II, UNCONTROLLED (ICD-250.02) Assessment: Deteriorated  Poor adherence to medication regimen, diet, exercise (this has always been the case with [name]). Refilled script for needles. Advised to check blood sugars daily. Reviewed dietary recommendations (again). Follow up in one month.  The following medications were removed from the medication list:    Lisinopril 40 Mg Tabs (Lisinopril) ..... One tab by mouth qday His updated medication list for this problem includes:    Lantus 100 Unit/ml Soln (Insulin glargine) .Marland KitchenMarland KitchenMarland KitchenMarland Kitchen 50 units twice daily.  then increase by 1 unit in the am and pm if morning cbgs is still higher than 100.  dispense 1 month supply please  Orders: [facility]- Est  Level 4 [id number])  Labs Reviewed: Creat: 2.16 (06/30/2010)    Reviewed HgBA1c results: 10.5 (06/26/2010)  9.0 (01/09/2010)  Problem # 3:  ESSENTIAL HYPERTENSION (ICD-401.9)  Readdressed need for DASH diet, exercise. Will restart hydralazine, norvasc. Stopped lisinopril, HOLDING isosorbide dinitrate.   His updated medication list for this problem includes:    Torsemide 100 Mg Tabs (Torsemide) ..... One tabs each morning by mouth    Coreg 25 Mg Tabs (Carvedilol) ..... One tab by mouth two times a day    Hydralazine Hcl 25 Mg Tabs (Hydralazine hcl) ..... One tab by mouth three times a day    Amlodipine Besylate 10 Mg Tabs (Amlodipine besylate) ..... One tab by mouth qday  Orders: [facility]- Est  Level 4 (99214)  BP today:  175/80 Prior BP: 173/98 (06/30/2010)  Labs Reviewed: K+: 4.9 (06/30/2010) Creat: : 2.16 (06/30/2010)   Chol: 193 (10/31/2009)   HDL: 46 (10/31/2009)   LDL: 89 (10/31/2009)   TG: 289 (10/31/2009)  Complete Medication List: 1)  Plavix 75 Mg Tabs (Clopidogrel bisulfate) .... One tablet by mouth dialy 2)  Torsemide 100 Mg Tabs (Torsemide) .... One tabs each morning by mouth 3)  Crestor 20 Mg Tabs (Rosuvastatin calcium) .... One tablet by mouth daily 4)  Coreg 25 Mg Tabs (Carvedilol) .... One tab by mouth two times a day 5)  Lantus 100 Unit/ml Soln (Insulin glargine) .... 50 units twice daily.  then increase by 1 unit in the am and pm if morning cbgs is still higher than 100.  dispense 1 month supply please 6)  Bd Insulin Syringe  Ultrafine 30g X 1/2" 1 Ml Misc (Insulin syringe-needle u-100) .... Dispense one month supply for once daily dosing.  dispense 1 box. 7)  Isosorbide Dinitrate 20 Mg Tabs (Isosorbide dinitrate) .... One tab by mouth three times a day 8)  Hydralazine Hcl 25 Mg Tabs (Hydralazine hcl) .... One tab by mouth three times a day 9)  Gabapentin 300 Mg Caps (Gabapentin) .... One tab by mouth three times a day 10)  Amlodipine Besylate 10 Mg Tabs (Amlodipine besylate) .... One tab by mouth qday 11)  Cpap 15 Cwp Ahi 0 Per Hour.  .... Medium resmed mirage quattro full face mask w/ heated humidifier. wear at night every night  Patient Instructions: 1)  Restart hydralazine  2)  Restart amlodipine 3)  Take your insulin 4)  See me in one month  Prescriptions: BD INSULIN SYRINGE ULTRAFINE 30G X 1/2" 1 ML MISC (INSULIN SYRINGE-NEEDLE U-100) Dispense one month supply for once daily dosing.  Dispense 1 box.  #1 x 11   Entered and Authorized by:   Mariana Arn  MD   Signed by:   Mariana Arn  MD on 07/10/2010   Method used:   Print then Give to Patient   RxID:   [id number]    Orders Added: 1)  [facility]- Est  Level 4 [id number]

## 2010-10-24 NOTE — Assessment & Plan Note (Signed)
Summary: edema & labored breathing/[place]/Carew   Vital Signs:  Patient profile:   55 year old male Height:      70 inches Weight:      294 pounds BMI:     42.34 BSA:     2.46 O2 Sat:      97 % on Room air Temp:     99.2 degrees F Pulse rate:   104 / minute BP sitting:   172 / 101  Vitals Entered By: Christen Bame CMA (November 11, 2009 1:47 PM)  O2 Flow:  Room air CC: edema and labored breathing 2-3 days Is Patient Diabetic? No Pain Assessment Patient in pain? no        Primary Care Provider:  Mariana Arn  MD  CC:  edema and labored breathing 2-3 days.  History of Present Illness: 1. SOB reports that he feels "bloated" in his abdomen and chest. Has been seen recently (last on Monday) for increasing SOB and edema. Spirinolactone added at last visit. Pt reports that he hasn't seem to urinate more frequently except for today. States that he stepped on the scale this am and it read 320 lbs, he got concerned and called for a w/i appt. takes torsemide 200 mg daily for his edema and CHF. Has a history of systolic CHF with EF of [id number] on TEE in july 2010. States that he drinks a lot of fluid every day, including quite a bit of juice and soda.  Reports that he doesn't follow a specific diet except that he watches his salt. Sleeps on 3-4 pillows at night.   Habits & Providers  Alcohol-Tobacco-Diet     Tobacco Status: never  Current Medications (verified): 1)  Plavix 75 Mg Tabs (Clopidogrel Bisulfate) .... One Tablet By Mouth Dialy 2)  Torsemide 100 Mg Tabs (Torsemide) .... Two Tabs Each Morning By Mouth 3)  Crestor 20 Mg Tabs (Rosuvastatin Calcium) .... One Tablet By Mouth Daily 4)  Glipizide 5 Mg Tabs (Glipizide) .... One Tab By Mouth Two Times A Day 5)  Lisinopril 40 Mg Tabs (Lisinopril) .... Take One Tablet By Mouth Daily 6)  Coreg 25 Mg Tabs (Carvedilol) .... One Tab By Mouth Two Times A Day 7)  Amlodipine Besylate 10 Mg Tabs (Amlodipine Besylate) .... Take One Tablet By  Mouth Daily 8)  Lantus 100 Unit/ml Soln (Insulin Glargine) .... 45 Units Twice Daily.  Then Increase By 1 Unit in The Am and Pm If Morning Cbgs Is Still Higher Than 100.  Dispense 1 Month Supply. 9)  Bd Insulin Syringe Ultrafine 30g X 1/2" 1 Ml Misc (Insulin Syringe-Needle U-100) .... Dispense One Month Supply For Once Daily Dosing.  Dispense 1 Box. 10)  Spironolactone 25 Mg Tabs (Spironolactone) .... One Tab By Mouth Qday  Allergies (verified): No Known Drug Allergies  Social History: Smoking Status:  never  Review of Systems       no chest pain. No HA or vision changes. No nausea/vomiting/diarrhea.  Physical Exam  General:  Obese, no acute distress. Not in extremis. VS reviewed. Hypertensive.  Neck:  large circumference Lungs:  work of breathing mildly labored with movement. Unlabored at rest, clear to auscultation bilaterally; no wheezes, rales, or ronchi; good air movement throughout. specifically no rales BLL Heart:  Normal rate and regular rhythm. S1 and S2 normal without gallop, murmur, click, rub or other extra sounds. Abdomen:  distended, tight; no tenderness with palpation Extremities:  trace to 1+ pitting edema to mid shins    Impression &  Recommendations:  Problem # 1:  CONGESTIVE HEART FAILURE, SYSTOLIC DYSFUNCTION (A999333) Assessment Deteriorated Continue current regimen. Pt demonstrates poor understanding of diet. I feel much of his problem is related to excessive fluid and unknown sodium intake. Will increase spironolactone, check BMET today. And have pt follow-up on Monday with Dr. Sherilyn Cooter. Advised to proceed to ED for worsening SOB, onset of chest pain, other concerning symptoms. He expressees agreement.   His updated medication list for this problem includes:    Plavix 75 Mg Tabs (Clopidogrel bisulfate) ..... One tablet by mouth dialy    Torsemide 100 Mg Tabs (Torsemide) .Marland Kitchen..Marland Kitchen Two tabs each morning by mouth    Lisinopril 40 Mg Tabs (Lisinopril) .Marland Kitchen... Take one  tablet by mouth daily    Coreg 25 Mg Tabs (Carvedilol) ..... One tab by mouth two times a day    Spironolactone 25 Mg Tabs (Spironolactone) ..... One tab by mouth qday  Orders: Pulse Oximetry- [place] ([id number]) Basic Met-[facility] [id number]) [facility]- Est Level  3 [id number])  Problem # 2:  ESSENTIAL HYPERTENSION (ICD-401.9) Assessment: Comment Only increase sprinolactone. follow-up on Monday.   His updated medication list for this problem includes:    Torsemide 100 Mg Tabs (Torsemide) .Marland Kitchen..Marland Kitchen Two tabs each morning by mouth    Lisinopril 40 Mg Tabs (Lisinopril) .Marland Kitchen... Take one tablet by mouth daily    Coreg 25 Mg Tabs (Carvedilol) ..... One tab by mouth two times a day    Amlodipine Besylate 10 Mg Tabs (Amlodipine besylate) .Marland Kitchen... Take one tablet by mouth daily    Spironolactone 25 Mg Tabs (Spironolactone) ..... One tab by mouth qday  Complete Medication List: 1)  Plavix 75 Mg Tabs (Clopidogrel bisulfate) .... One tablet by mouth dialy 2)  Torsemide 100 Mg Tabs (Torsemide) .... Two tabs each morning by mouth 3)  Crestor 20 Mg Tabs (Rosuvastatin calcium) .... One tablet by mouth daily 4)  Glipizide 5 Mg Tabs (Glipizide) .... One tab by mouth two times a day 5)  Lisinopril 40 Mg Tabs (Lisinopril) .... Take one tablet by mouth daily 6)  Coreg 25 Mg Tabs (Carvedilol) .... One tab by mouth two times a day 7)  Amlodipine Besylate 10 Mg Tabs (Amlodipine besylate) .... Take one tablet by mouth daily 8)  Lantus 100 Unit/ml Soln (Insulin glargine) .... 45 units twice daily.  then increase by 1 unit in the am and pm if morning cbgs is still higher than 100.  dispense 1 month supply. 9)  Bd Insulin Syringe Ultrafine 30g X 1/2" 1 Ml Misc (Insulin syringe-needle u-100) .... Dispense one month supply for once daily dosing.  dispense 1 box. 10)  Spironolactone 25 Mg Tabs (Spironolactone) .... One tab by mouth qday  Patient Instructions: 1)  limit your fluids to 1.5 liters per day 2)  stop soft drinks (these have  sodium).  3)  increase your spirinolactone to two times a day  4)  follow-up with Dr. Sherilyn Cooter on Monday. Prescriptions: CRESTOR 20 MG TABS (ROSUVASTATIN CALCIUM) one tablet by mouth daily  #34 x 2   Entered and Authorized by:   Eugenie Norrie  MD   Signed by:   Eugenie Norrie  MD on 11/11/2009   Method used:   Electronically to        [facility] (retail)       [address]       [place], [place]  [address]       Ph:  [phone]       Fax: [phone]   [id number]    Prevention & Chronic Care Immunizations   Influenza vaccine: Not documented    Tetanus booster: Not documented    Pneumococcal vaccine: Not documented  Colorectal Screening   Hemoccult: Not documented    Colonoscopy: Not documented  Other Screening   PSA: Not documented   Smoking status: never  (11/11/2009)  Diabetes Mellitus   HgbA1C: 11.4  (08/30/2009)   Hemoglobin A1C due: 11/28/2009    Eye exam: Not documented    Foot exam: Not documented   High risk foot: Not documented   Foot care education: Not documented    Urine microalbumin/creatinine ratio: Not documented    Diabetes flowsheet reviewed?: Yes   Progress toward A1C goal: Unchanged  Lipids   Total Cholesterol: 193  (10/31/2009)   LDL: 89  (10/31/2009)   LDL Direct: Not documented   HDL: 46  (10/31/2009)   Triglycerides: 289  (10/31/2009)   Lipid panel due: 10/31/2009    SGOT (AST): 16  (10/31/2009)   SGPT (ALT): 14  (10/31/2009)   Alkaline phosphatase: 56  (10/31/2009)   Total bilirubin: 0.4  (10/31/2009)   Liver panel due: 10/11/2009    Lipid flowsheet reviewed?: Yes   Progress toward LDL goal: Unchanged  Hypertension   Last Blood Pressure: 172 / 101  (11/11/2009)   Serum creatinine: 1.68  (11/07/2009)   BMP action: Ordered   Serum potassium 4.1  (A999333)   Basic metabolic panel due: 0000000    Hypertension flowsheet reviewed?: Yes   Progress toward BP goal:  Deteriorated  Self-Management Support :   Personal Goals (by the next clinic visit) :     Personal A1C goal: 9  (08/30/2009)     Personal blood pressure goal: 140/90  (06/07/2009)     Personal LDL goal: 100  (06/16/2009)    Diabetes self-management support: CBG self-monitoring log, Written self-care plan  (09/06/2009)    Diabetes self-management support not done because: Good outcomes  (10/11/2009)    Hypertension self-management support: Education handout, Written self-care plan  (11/11/2009)   Hypertension self-care plan printed.   Hypertension education handout printed    Hypertension self-management support not done because: Good outcomes  (10/28/2009)    Lipid self-management support: Written self-care plan  (10/28/2009)

## 2010-10-24 NOTE — Consult Note (Signed)
Summary: [place] By: Raymond Gurney 02/23/2010 14:56:20  _____________________________________________________________________  External Attachment:    Type:   Image     Comment:   External Document  Appended Document: [facility]    Clinical Lists Changes  Medications: Removed medication of LISINOPRIL 40 MG TABS (LISINOPRIL) Take one tablet by mouth daily Added new medication of AMLODIPINE BESYLATE 10 MG TABS (AMLODIPINE BESYLATE) one tab by mouth qday

## 2010-10-24 NOTE — Progress Notes (Signed)
Summary: Test Res  Phone Note Call from Patient Call back at Home Phone [phone]   Caller: Patient Summary of Call: Pt checking on results from sleep study that he had. Initial call taken by: Raymond Gurney,  May 31, 2010 3:53 PM  Follow-up for Phone Call        will forward to MD Follow-up by: Marcell Barlow RN,  May 31, 2010 4:15 PM  Additional Follow-up for Phone Call Additional follow up Details #1::        study report placed in MD box for review. Additional Follow-up by: Marcell Barlow RN,  May 31, 2010 4:43 PM    Additional Follow-up for Phone Call Additional follow up Details #2::    Attempted to call patient. No answer.  Follow-up by: Mariana Arn  MD,  June 02, 2010 10:09 AM  Additional Follow-up for Phone Call Additional follow up Details #3:: Details for Additional Follow-up Action Taken: Attempted to call patient. No answer. Left message to return call.  Additional Follow-up by: Mariana Arn  MD,  June 05, 2010 11:04 AM  pt returned call - will be there for about an hour Audie Clear  June 06, 2010 4:53 PM  Returned call. No answer. Mariana Arn  MD  June 07, 2010 8:42 AM  [id number]

## 2010-10-24 NOTE — Assessment & Plan Note (Signed)
Summary: FU/KH   Vital Signs:  Patient profile:   55 year old male Height:      70 inches Weight:      289 pounds BMI:     41.62 BSA:     2.44 Temp:     97.7 degrees F Pulse rate:   96 / minute BP sitting:   147 / 88  Vitals Entered By: Christen Bame CMA (October 31, 2009 9:50 AM) CC: f/u   Primary Care Provider:  Mariana Arn  MD  CC:  f/u.  History of Present Illness: 1) Fluid retention: Patient seen on 2/4 w/ reports worsening extremity edema, dyspnea, orthopnea, PND since starting Lantus in January. Reports urinating less since starting insulin therapy. Had been taking his Lasix 160 mg in the AM and 80 mg at night, though he has not taken today. Edema is in his hands, LE up to his knees and he feels as though his abdomen is swollen. Had gained about 18 lbs since last visit with me in December. Denies chest pain, pleuritic pain, wheezing, cough, fever, chills.     Now his edema has improved somewhat since being started on Torsemide 200 mg. Weight is down 5 lbs. Breathing has improved, though still w/ orthopnea, and PND.    Current Medications (verified): 1)  Plavix 75 Mg Tabs (Clopidogrel Bisulfate) .... One Tablet By Mouth Dialy 2)  Torsemide 100 Mg Tabs (Torsemide) .... Two Tabs Each Morning By Mouth 3)  Crestor 20 Mg Tabs (Rosuvastatin Calcium) .... One Tablet By Mouth Daily 4)  Glipizide 5 Mg Tabs (Glipizide) .... One Tab By Mouth Two Times A Day 5)  Lisinopril 40 Mg Tabs (Lisinopril) .... Take One Tablet By Mouth Daily 6)  Coreg 25 Mg Tabs (Carvedilol) .... One Tab By Mouth Two Times A Day 7)  Amlodipine Besylate 10 Mg Tabs (Amlodipine Besylate) .... Take One Tablet By Mouth Daily 8)  Lantus 100 Unit/ml Soln (Insulin Glargine) .... 45 Units Twice Daily.  Then Increase By 1 Unit in The Am and Pm If Morning Cbgs Is Still Higher Than 100.  Dispense 1 Month Supply. 9)  Bd Insulin Syringe Ultrafine 30g X 1/2" 1 Ml Misc (Insulin Syringe-Needle U-100) .... Dispense One Month  Supply For Once Daily Dosing.  Dispense 1 Box. 10)  Spironolactone 25 Mg Tabs (Spironolactone) .... One Tab By Mouth Qday  Allergies (verified): No Known Drug Allergies  Physical Exam  General:  obese, dysarthria appears stable as compared to my prior evaluation. NAD   Neck:  no bruits, +JVD Lungs:  CTAB w/o crackles.  Heart:  RRR no murmurs  Abdomen:  ascites, obese, +BS Pulses:  2+ pedals Extremities:  1+ pitting edema to mid shins    Impression & Recommendations:  Problem # 1:  CONGESTIVE HEART FAILURE, SYSTOLIC DYSFUNCTION (A999333) Assessment Unchanged  Will add spironolactone. Class III. Follow labs at 3 days, one week and 1 month. Cr improving as compared to prior values. Will follow in one month.  His updated medication list for this problem includes:    Plavix 75 Mg Tabs (Clopidogrel bisulfate) ..... One tablet by mouth dialy    Torsemide 100 Mg Tabs (Torsemide) .Marland Kitchen..Marland Kitchen Two tabs each morning by mouth    Lisinopril 40 Mg Tabs (Lisinopril) .Marland Kitchen... Take one tablet by mouth daily    Coreg 25 Mg Tabs (Carvedilol) ..... One tab by mouth two times a day    Spironolactone 25 Mg Tabs (Spironolactone) ..... One tab by mouth qday  Orders: [place]- Est Level  3 [id number])  Complete Medication List: 1)  Plavix 75 Mg Tabs (Clopidogrel bisulfate) .... One tablet by mouth dialy 2)  Torsemide 100 Mg Tabs (Torsemide) .... Two tabs each morning by mouth 3)  Crestor 20 Mg Tabs (Rosuvastatin calcium) .... One tablet by mouth daily 4)  Glipizide 5 Mg Tabs (Glipizide) .... One tab by mouth two times a day 5)  Lisinopril 40 Mg Tabs (Lisinopril) .... Take one tablet by mouth daily 6)  Coreg 25 Mg Tabs (Carvedilol) .... One tab by mouth two times a day 7)  Amlodipine Besylate 10 Mg Tabs (Amlodipine besylate) .... Take one tablet by mouth daily 8)  Lantus 100 Unit/ml Soln (Insulin glargine) .... 45 units twice daily.  then increase by 1 unit in the am and pm if morning cbgs is still higher than 100.   dispense 1 month supply. 9)  Bd Insulin Syringe Ultrafine 30g X 1/2" 1 Ml Misc (Insulin syringe-needle u-100) .... Dispense one month supply for once daily dosing.  dispense 1 box. 10)  Spironolactone 25 Mg Tabs (Spironolactone) .... One tab by mouth qday  Other Orders: Comp Met[facility] [id number]) Lipid-[facility] [id number])  Patient Instructions: 1)  Start your new blood pressure medication as directed.  2)  Come back on Thursday, then Monday for lab work in the morning.  3)  Keep taking all your medicines as before.  4)  Follow up in one month with Dr. Sherilyn Cooter  Prescriptions: PLAVIX 75 MG TABS (CLOPIDOGREL BISULFATE) one tablet by mouth dialy  #34 x 3   Entered and Authorized by:   Mariana Arn  MD   Signed by:   Mariana Arn  MD on 10/31/2009   Method used:   Electronically to        [facility] (retail)       [address]       [place], [place]  [address]       Ph: [phone]       Fax: [phone]   [id number] SPIRONOLACTONE 25 MG TABS (SPIRONOLACTONE) one tab by mouth qday  #30 x 1   Entered and Authorized by:   Mariana Arn  MD   Signed by:   Mariana Arn  MD on 10/31/2009   Method used:   Electronically to        [facility] (retail)       [address]       [place], [place]  [address]       Ph: [phone]       Fax: [phone]   RxID:   [id number]   Appended Document: FU/KH    Clinical Lists Changes  Orders: Added new Test order of [facility] [id number]) - Signed Added new Test order of [facility] [id number]) - Signed

## 2010-10-24 NOTE — Assessment & Plan Note (Signed)
Summary: F/U Diabetes - Rx Clinic   Vital Signs:  Patient profile:   55 year old male Height:      70 inches Weight:      292 pounds BMI:     42.05 Pulse rate:   92 / minute BP sitting:   143 / 86  (right arm)  Primary Care Provider:  Mariana Arn  MD   History of Present Illness: Mr. Robello presents to clinic today for follow-up of diabetes.  At previous visit, lantus was increased to 20 units per day with instructions to increase by 2 units per day if morning glucose readings were above 120.  Patient reports compliance with these instructions and is seeing postive results with regard to his blood glucose readings.  Patient reports less frequent urination vs. what he was experiencing a few months ago when glucose readings were in the 400s.    Patient's diet is relatively uncontrolled and he reports eating fried foods as well as snacking throughout the day and night as well.    Current Medications (verified): 1)  Plavix 75 Mg Tabs (Clopidogrel Bisulfate) .... One Tablet By Mouth Dialy 2)  Lasix 80 Mg Tabs (Furosemide) .... 2 Tab in The Am and 1 Tab in Pm 3)  Crestor 20 Mg Tabs (Rosuvastatin Calcium) .... One Tablet By Mouth Daily 4)  Glipizide 5 Mg Tabs (Glipizide) .... One Tab By Mouth Two Times A Day 5)  Lisinopril 40 Mg Tabs (Lisinopril) .... Take One Tablet By Mouth Daily 6)  Coreg 25 Mg Tabs (Carvedilol) .... One Tab By Mouth Two Times A Day 7)  Amlodipine Besylate 10 Mg Tabs (Amlodipine Besylate) .... Take One Tablet By Mouth Daily 8)  Lantus 100 Unit/ml Soln (Insulin Glargine) .... 45 Units Twice Daily.  Then Increase By 1 Unit in The Am and Pm If Morning Cbgs Is Still Higher Than 100.  Dispense 1 Month Supply. 9)  Bd Insulin Syringe Ultrafine 30g X 1/2" 1 Ml Misc (Insulin Syringe-Needle U-100) .... Dispense One Month Supply For Once Daily Dosing.  Dispense 1 Box.  Allergies (verified): No Known Drug Allergies   Impression & Recommendations:  Problem # 1:  DIABETES  MELLITUS, TYPE II, UNCONTROLLED (ICD-250.02) Assessment Improved  Iimproved diabetes.  Patient is now up to Lantus 85 units per day.  Current morning blood glucose readings range from 150s-170s on average, there have been a couple outliers of 95 and 200s.  Patient explains the reading of 95 being due to eating only one meal.  Patient reports less frequent hyperglycemic symptoms since his readings have been lower.  Diet is poorly controlled.  I have encouraged patient to choose healthier snacks and opt for baked foods vs fried foods.  Plan:  Will increase Lantus and divide into two doses.  Lantus 45 units in AM and PM, increase by 1 unit in AM and PM for morning blood glucose readings >100.  Have provided patient with written instructions and verbal explanation of new lantus directions.  Patient has demonstrated an understanding of new regimen.  Have also provided education regarding diet and BP, as his blood pressure is above goal of <130/80.  TTFFC: 40 minutes.  Seen with Margaretha Sheffield, PharmD  His updated medication list for this problem includes:    Glipizide 5 Mg Tabs (Glipizide) ..... One tab by mouth two times a day    Lisinopril 40 Mg Tabs (Lisinopril) .Marland Kitchen... Take one tablet by mouth daily    Lantus 100 Unit/ml Soln (Insulin glargine) .Marland KitchenMarland KitchenMarland KitchenMarland Kitchen  45 units twice daily.  then increase by 1 unit in the am and pm if morning cbgs is still higher than 100.  dispense 1 month supply.  Orders: Reassessment Each 15 min unit- [place] [id number])  Problem # 2:  HYPERLIPIDEMIA (B2193296.4) Assessment: Comment Only Reevaluate lipid panel at next blood draw.   Triglycerides should be improved with improved glygemic control.  His updated medication list for this problem includes:    Crestor 20 Mg Tabs (Rosuvastatin calcium) ..... One tablet by mouth daily  Complete Medication List: 1)  Plavix 75 Mg Tabs (Clopidogrel bisulfate) .... One tablet by mouth dialy 2)  Lasix 80 Mg Tabs (Furosemide) .... 2 tab in the am and 1  tab in pm 3)  Crestor 20 Mg Tabs (Rosuvastatin calcium) .... One tablet by mouth daily 4)  Glipizide 5 Mg Tabs (Glipizide) .... One tab by mouth two times a day 5)  Lisinopril 40 Mg Tabs (Lisinopril) .... Take one tablet by mouth daily 6)  Coreg 25 Mg Tabs (Carvedilol) .... One tab by mouth two times a day 7)  Amlodipine Besylate 10 Mg Tabs (Amlodipine besylate) .... Take one tablet by mouth daily 8)  Lantus 100 Unit/ml Soln (Insulin glargine) .... 45 units twice daily.  then increase by 1 unit in the am and pm if morning cbgs is still higher than 100.  dispense 1 month supply. 9)  Bd Insulin Syringe Ultrafine 30g X 1/2" 1 Ml Misc (Insulin syringe-needle u-100) .... Dispense one month supply for once daily dosing.  dispense 1 box.  Patient Instructions: 1)  Change your Lantus to 45 units in the AM and the PM.  Increase by 1 unit in both the AM and PM if your morning blood sugar is higher than 100.    2)  Follow up with Dr. Sherilyn Cooter in 4-6 weeks.  Prescriptions: LANTUS 100 UNIT/ML SOLN (INSULIN GLARGINE) 45 units twice daily.  Then increase by 1 unit in the AM and PM if morning CBGs is still higher than 100.  Dispense 1 month supply.  #1 x 5   Entered by:   Rinaldo Ratel D   Authorized by:   Mariana Arn  MD   Signed by:   Janeann Forehand Pharm D on 10/11/2009   Method used:   Electronically to        [facility] (retail)       [address]       [place], [place]  [address]       Ph: [phone]       Fax: [phone]   RxID:   [id number]   Prevention & Chronic Care Immunizations   Influenza vaccine: Not documented    Tetanus booster: Not documented    Pneumococcal vaccine: Not documented  Colorectal Screening   Hemoccult: Not documented    Colonoscopy: Not documented  Other Screening   PSA: Not documented   Smoking status: never  (08/30/2009)  Diabetes Mellitus   HgbA1C: 11.4  (08/30/2009)   Hemoglobin A1C due: 11/28/2009    Eye exam:  Not documented    Foot exam: Not documented   High risk foot: Not documented   Foot care education: Not documented    Urine microalbumin/creatinine ratio: Not documented    Diabetes flowsheet reviewed?: Yes   Progress toward A1C goal: Improved  Lipids   Total Cholesterol: 295  (05/10/2009)   LDL: See Comment mg/dL  (05/10/2009)   LDL  Direct: Not documented   HDL: 43  (05/10/2009)   Triglycerides: 640  (05/10/2009)   Lipid panel due: 11/28/2009    SGOT (AST): 13  (07/11/2009)   SGPT (ALT): 15  (07/11/2009)   Alkaline phosphatase: 64  (07/11/2009)   Total bilirubin: 0.4  (07/11/2009)   Liver panel due: 10/11/2009    Lipid flowsheet reviewed?: Yes  Hypertension   Last Blood Pressure: 143 / 86  (10/11/2009)   Serum creatinine: 1.97  (08/30/2009)   BMP action: Ordered   Serum potassium 4.1  ([id number])   Basic metabolic panel due: 0000000    Hypertension flowsheet reviewed?: Yes   Progress toward BP goal: Improved  Self-Management Support :   Personal Goals (by the next clinic visit) :     Personal A1C goal: 9  (08/30/2009)     Personal blood pressure goal: 140/90  (06/07/2009)     Personal LDL goal: 100  (06/16/2009)    Diabetes self-management support: CBG self-monitoring log, Written self-care plan  (09/06/2009)    Diabetes self-management support not done because: Good outcomes  (10/11/2009)    Hypertension self-management support: BP self-monitoring log, Written self-care plan, Education handout, Referred for self-management class  (08/30/2009)    Lipid self-management support: Written self-care plan, Referred for self-management class  (08/30/2009)

## 2010-10-24 NOTE — Assessment & Plan Note (Signed)
Summary: f/u   Vital Signs:  Patient profile:   55 year old male Height:      70 inches Weight:      305.3 pounds BMI:     43.96 Temp:     98.2 degrees F oral Pulse rate:   91 / minute BP sitting:   173 / 98  (left arm) Cuff size:   large  Vitals Entered By: Levert Feinstein LPN (October  7, 624THL 9:00 AM) CC: f/u dizziness Is Patient Diabetic? Yes Did you bring your meter with you today? No   Primary Care Yanni Quiroa:  Mariana Arn  MD  CC:  f/u dizziness.  History of Present Illness: 1) Dizziness: Seen on 06/26/10 with complaints sof dizziness / lightheadedness and generalized weakness. BMET at that that time revealed an acute on chronic renal insufficiency (Cr over 3.0). Blood pressure at that time was 100's / 60's. Patient was advised to stop taking his torsemide and his lisinopril when labs became available. He reports that he has stopped all his medications except his insulin and that his dizziness and weakness have resolved. He also reports that the had been taking his torsemide 100 mg THREE times a day (states that his nephrologist told him to do so - though I can find no record of this in notes forwarded) and that he was taking his Coreg THREE  times a day (reports that his cardiologist told him to do so - again I can find no record of this). EKG when seen on 10/3 was unchanged from prior.   ROS: Denies chest pain, fever, chills, nausea, vomiting or diarrhea , slurring speech, focal weakness, orthopnea, cough,   Reports chronic constipation (relieved by magnesium citrate), dyspnea at baseline    Med rec: Lantus as below. Stopped taking ALL his medications on 06/27/10  Habits & Providers  Alcohol-Tobacco-Diet     Tobacco Status: quit     Year Quit: 25 yr  Allergies (verified): No Known Drug Allergies  Physical Exam  General:  obese, NAD, appears well today, vitals reviewed  Neck:  no JVD  Lungs:  CTAB w/o crackles,  Heart:  Normal rate and regular rhythm. S1 and S2 normal  without gallop, murmur, click, rub or other extra sounds. Abdomen:  obese, somewhat distended, no tenderness.  No fluid wave palpated.  No masses.  Good bowel sounds.   Pulses:  2+ radials  Extremities:  trace edema  Neurologic:  alert & oriented X3 and cranial nerves II-XII intact.  strength normal in all extremities and sensation intact to light touch.     Impression & Recommendations:  Problem # 1:  DIZZINESS (ICD-780.4) Assessment Improved  Likely secondary to relative hypotension with taking incorrect doses of his Coreg and torsemide. Patient highly resistant to idea of restarting all of his medications. Will start his Torsemide (at 1/2 dose - 100 mg PO qday) Coreg and Plavix (and continue Lantus) today. Will follow up in one week. Will check BMET today.   Orders: [place]- Est Level  3 [id number])  Complete Medication List: 1)  Plavix 75 Mg Tabs (Clopidogrel bisulfate) .... One tablet by mouth dialy 2)  Torsemide 100 Mg Tabs (Torsemide) .... One tabs each morning by mouth 3)  Crestor 20 Mg Tabs (Rosuvastatin calcium) .... One tablet by mouth daily 4)  Coreg 25 Mg Tabs (Carvedilol) .... One tab by mouth two times a day 5)  Lantus 100 Unit/ml Soln (Insulin glargine) .... 50 units twice daily.  then increase by 1  unit in the am and pm if morning cbgs is still higher than 100.  dispense 1 month supply please 6)  Bd Insulin Syringe Ultrafine 30g X 1/2" 1 Ml Misc (Insulin syringe-needle u-100) .... Dispense one month supply for once daily dosing.  dispense 1 box. 7)  Isosorbide Dinitrate 20 Mg Tabs (Isosorbide dinitrate) .... One tab by mouth three times a day 8)  Hydralazine Hcl 25 Mg Tabs (Hydralazine hcl) .... One tab by mouth three times a day 9)  Gabapentin 300 Mg Caps (Gabapentin) .... One tab by mouth three times a day 10)  Amlodipine Besylate 10 Mg Tabs (Amlodipine besylate) .... One tab by mouth qday 11)  Lisinopril 40 Mg Tabs (Lisinopril) .... One tab by mouth qday 12)  Cpap 15 Cwp Ahi  0 Per Hour.  .... Medium resmed mirage quattro full face mask w/ heated humidifier. wear at night every night  Other Orders: Basic [facility] [id number])  Patient Instructions: 1)  Come see me in one week. 2)  Start taking your torsemide (FLUID PILL) - once a day until you see me 3)  Start taking your Coreg twice a day until you see me

## 2010-10-24 NOTE — Assessment & Plan Note (Signed)
Summary: f/u,df   Vital Signs:  Patient profile:   55 year old male Height:      70 inches Weight:      298.9 pounds BMI:     43.04 O2 Sat:      96 % on Room air Temp:     98.0 degrees F oral Pulse rate:   103 / minute BP sitting:   162 / 83  (left arm) Cuff size:   regular  Vitals Entered By: Levert Feinstein LPN (April 18, 624THL QA348G PM)  O2 Flow:  Room air CC: f/u dm, sob Is Patient Diabetic? Yes Did you bring your meter with you today? Yes Pain Assessment Patient in pain? yes     Location: lower back   Primary Care Provider:  Mariana Arn  MD  CC:  f/u dm and sob.  History of Present Illness: 1) CHF: Systolic. On Torsemide, Coreg, Lisinopril, spironolactone as below. Reports continued dyspnea and orthopnea (though slightly improved as compared to prior). Continues to not monitor salt intake or fluid intake. (was given recommendations for 1500 mg sodium, 1.5 liters fluid). Reports continued LE edema as well.   2) DM2: Fasting CBGs = 90 - 200. Reports that he is up to Lantus 70 units BID, but runs out quickly because of this and does not pick up his refills for several days, hence his fluctuating control.  A1C = 9.0 today which is improved. Denies polyuria or polydipsia, or vision change. Continues to drink sodas, eat poorly in general.   3) HTN: Denies medication side effects. Denies chest pain. Reports dyspnea, LE edema as above. Has cut back on fried foods, increased vegetables (now up to three per day). Does not watch salt.   4) Pain in hands and feet: Reports burning, needle-like pain in hands and feet which is worse at night. Relieved by taking a friend's oxycodones. Pain keeps him from sleeping. History of uncontrolled diabetes.  Denies focal neurological changes, weakness.   Current Medications (verified): 1)  Plavix 75 Mg Tabs (Clopidogrel Bisulfate) .... One Tablet By Mouth Dialy 2)  Torsemide 100 Mg Tabs (Torsemide) .... Two Tabs Each Morning By Mouth 3)  Crestor 20  Mg Tabs (Rosuvastatin Calcium) .... One Tablet By Mouth Daily 4)  Glipizide 5 Mg Tabs (Glipizide) .... One Tab By Mouth Two Times A Day 5)  Lisinopril 40 Mg Tabs (Lisinopril) .... Take One Tablet By Mouth Daily 6)  Coreg 25 Mg Tabs (Carvedilol) .... One Tab By Mouth Two Times A Day 7)  Lantus 100 Unit/ml Soln (Insulin Glargine) .... 70 Units Twice Daily.  Then Increase By 1 Unit in The Am and Pm If Morning Cbgs Is Still Higher Than 100.  Dispense 1 Month Supply Please 8)  Bd Insulin Syringe Ultrafine 30g X 1/2" 1 Ml Misc (Insulin Syringe-Needle U-100) .... Dispense One Month Supply For Once Daily Dosing.  Dispense 1 Box. 9)  Spironolactone 25 Mg Tabs (Spironolactone) .... One Tab By Mouth Qday 10)  Isosorbide Dinitrate 20 Mg Tabs (Isosorbide Dinitrate) .... One Tab By Mouth Three Times A Day 11)  Hydralazine Hcl 25 Mg Tabs (Hydralazine Hcl) .... One Tab By Mouth Three Times A Day 12)  Gabapentin 300 Mg Caps (Gabapentin) .... One Tab By Mouth Three Times A Day  Allergies (verified): No Known Drug Allergies  Physical Exam  General:  Obese, no acute distress. Not in extremis. VS reviewed. Hypertensive. Weight is up 5 lbs since last visit.  Mouth:  moist membranes  Lungs:  work of breathing mildly labored with movement. Unlabored at rest, clear to auscultation bilaterally; no wheezes, rales, or ronchi; good air movement throughout. specifically no rales BLL Heart:  Normal rate and regular rhythm. S1 and S2 normal without gallop, murmur, click, rub or other extra sounds. Abdomen:  quite distended, tight; no tenderness with palpation Pulses:  2+ pedals Extremities:  trace to 1+ pitting edema to mid shins  Neurologic:  alert & oriented X3.  cranial nerves II-XII intact.  sensation intact bilateral upper and lower extremities   Diabetes Management Exam:    Foot Exam (with socks and/or shoes not present):       Sensory-Pinprick/Light touch:          Left medial foot (L-4): normal          Left  dorsal foot (L-5): normal          Left lateral foot (S-1): normal          Right medial foot (L-4): normal          Right dorsal foot (L-5): normal          Right lateral foot (S-1): normal       Sensory-Monofilament:          Left foot: normal          Right foot: normal       Inspection:          Left foot: normal          Right foot: normal       Nails:          Left foot: normal          Right foot: ingrown R great toe w/ serous discharge w/o erythema or edema or exudate    Impression & Recommendations:  Problem # 1:  CONGESTIVE HEART FAILURE, SYSTOLIC DYSFUNCTION ([id number]) Assessment Unchanged  Will d/c amlodipine and start hydralazine and isosorbide dinitrate as below. Titrate up as blood pressures allow. Continue ACE, BBlocker, spironolactone. Continue Torsemide. Reiterated need for tight fluid control, monitoring of salt intake. Will check BMET today. Follow up in two weeks.    His updated medication list for this problem includes:    Plavix 75 Mg Tabs (Clopidogrel bisulfate) ..... One tablet by mouth dialy    Torsemide 100 Mg Tabs (Torsemide) .Marland Kitchen..Marland Kitchen Two tabs each morning by mouth    Lisinopril 40 Mg Tabs (Lisinopril) .Marland Kitchen... Take one tablet by mouth daily    Coreg 25 Mg Tabs (Carvedilol) ..... One tab by mouth two times a day    Spironolactone 25 Mg Tabs (Spironolactone) ..... One tab by mouth qday  Orders: [place]- Est  Level 4 [id number]  Problem # 2:  ESSENTIAL HYPERTENSION (ICD-401.9) Assessment: Unchanged  Will d/c amlodipine and start hydralazine and isosorbide dinitrate as below. DASH diet, fluid restriction. Reiterated need for exercise as well. Poor adherence continues to be an issue. BMET today.    The following medications were removed from the medication list:    Amlodipine Besylate 10 Mg Tabs (Amlodipine besylate) .Marland Kitchen... Take one tablet by mouth daily His updated medication list for this problem includes:    Torsemide 100 Mg Tabs (Torsemide) .Marland Kitchen..Marland Kitchen Two tabs  each morning by mouth    Lisinopril 40 Mg Tabs (Lisinopril) .Marland Kitchen... Take one tablet by mouth daily    Coreg 25 Mg Tabs (Carvedilol) ..... One tab by mouth two times a day    Spironolactone 25 Mg Tabs (Spironolactone) ..... One tab  by mouth qday    Hydralazine Hcl 25 Mg Tabs (Hydralazine hcl) ..... One tab by mouth three times a day  Orders: [place]- Est  Level 4 [id number])  Problem # 3:  DIABETES MELLITUS, TYPE II, UNCONTROLLED (ICD-250.02) Improving. Still not at goal. Refilled Lantus. Advised on importance of better control. Advised exercise. His updated medication list for this problem includes:    Glipizide 5 Mg Tabs (Glipizide) ..... One tab by mouth two times a day    Lisinopril 40 Mg Tabs (Lisinopril) .Marland Kitchen... Take one tablet by mouth daily    Lantus 100 Unit/ml Soln (Insulin glargine) .Marland KitchenMarland KitchenMarland KitchenMarland Kitchen 70 units twice daily.  then increase by 1 unit in the am and pm if morning cbgs is still higher than 100.  dispense 1 month supply please  Orders: A1C-[facility] [id number]) Comp Met-[facility] [id number]) [place]- Est  Level 4 [id number])  Problem # 4:  DIABETIC PERIPHERAL NEUROPATHY (ICD-250.60) Assessment: New  Likely, given uncontrolled diabetes. Will start gabapentin three times a day for this. Advised to not take other people's prescription medication.  His updated medication list for this problem includes:    Glipizide 5 Mg Tabs (Glipizide) ..... One tab by mouth two times a day    Lisinopril 40 Mg Tabs (Lisinopril) .Marland Kitchen... Take one tablet by mouth daily    Lantus 100 Unit/ml Soln (Insulin glargine) .Marland KitchenMarland KitchenMarland KitchenMarland Kitchen 70 units twice daily.  then increase by 1 unit in the am and pm if morning cbgs is still higher than 100.  dispense 1 month supply please  Orders: [place]- Est  Level 4 ([id number])  Problem # 5:  INGROWN NAIL (ICD-703.0) Assessment: New  Not infected at this time. Consider removal at next appointment in two weeks. Advised to apply bacitracin ointment two times a day.   Orders: [place]- Est  Level 4 [id number])  Complete  Medication List: 1)  Plavix 75 Mg Tabs (Clopidogrel bisulfate) .... One tablet by mouth dialy 2)  Torsemide 100 Mg Tabs (Torsemide) .... Two tabs each morning by mouth 3)  Crestor 20 Mg Tabs (Rosuvastatin calcium) .... One tablet by mouth daily 4)  Glipizide 5 Mg Tabs (Glipizide) .... One tab by mouth two times a day 5)  Lisinopril 40 Mg Tabs (Lisinopril) .... Take one tablet by mouth daily 6)  Coreg 25 Mg Tabs (Carvedilol) .... One tab by mouth two times a day 7)  Lantus 100 Unit/ml Soln (Insulin glargine) .... 70 units twice daily.  then increase by 1 unit in the am and pm if morning cbgs is still higher than 100.  dispense 1 month supply please 8)  Bd Insulin Syringe Ultrafine 30g X 1/2" 1 Ml Misc (Insulin syringe-needle u-100) .... Dispense one month supply for once daily dosing.  dispense 1 box. 9)  Spironolactone 25 Mg Tabs (Spironolactone) .... One tab by mouth qday 10)  Isosorbide Dinitrate 20 Mg Tabs (Isosorbide dinitrate) .... One tab by mouth three times a day 11)  Hydralazine Hcl 25 Mg Tabs (Hydralazine hcl) .... One tab by mouth three times a day 12)  Gabapentin 300 Mg Caps (Gabapentin) .... One tab by mouth three times a day  Patient Instructions: 1)  Try to get exercise to help lose weight. 2)  limit your fluids to 1.5 liters per day 3)  stop soft drinks (these have sodium). Keep your sodium below 1500mg  daily (this is salt)  4)  START taking hydralazine as directed, this will help with your blood pressure and your heart failure 5)  START  taking isosorbide  dinitrate as directed. this will help with your heart failure 6)  STOP taking amlodipine for now.  7)  apply antiobiotic ointment to ingrown toe (Bacitracin)  8)  follow-up with Dr. Sherilyn Cooter in 2 weeks  Prescriptions: LANTUS 100 UNIT/ML SOLN (INSULIN GLARGINE) 70 units twice daily.  Then increase by 1 unit in the AM and PM if morning CBGs is still higher than 100.  Dispense 1 month supply please  #1 x 12   Entered and  Authorized by:   Mariana Arn  MD   Signed by:   Mariana Arn  MD on 01/09/2010   Method used:   Electronically to        [facility] (retail)       [address]       [place], [place]  [address]       Ph: [phone]       Fax: [phone]   RxID:   [id number] SPIRONOLACTONE 25 MG TABS (SPIRONOLACTONE) one tab by mouth qday  #30 x 6   Entered and Authorized by:   Mariana Arn  MD   Signed by:   Mariana Arn  MD on 01/09/2010   Method used:   Electronically to        [facility] (retail)       [address]       [place], [place]  [address]       Ph: [phone]       Fax: [phone]   RxID:   [id number] CRESTOR 20 MG TABS (ROSUVASTATIN CALCIUM) one tablet by mouth daily  #34 x 6   Entered and Authorized by:   Mariana Arn  MD   Signed by:   Mariana Arn  MD on 01/09/2010   Method used:   Electronically to        [facility] (retail)       [address]       [place], [place]  [address]       Ph: [phone]       Fax: [phone]   RxID:   [id number] ISOSORBIDE DINITRATE 20 MG TABS (ISOSORBIDE DINITRATE) one tab by mouth three times a day  #90 x 3   Entered and Authorized by:   Mariana Arn  MD   Signed by:   Mariana Arn  MD on 01/09/2010   Method used:   Electronically to        [facility] (retail)       [address]       [place], [place]  [address]       Ph: [phone]       Fax: [phone]   RxID:   [id number] HYDRALAZINE HCL 25 MG TABS (HYDRALAZINE HCL) one tab by mouth three times a day  #90 x 3   Entered and Authorized by:   Mariana Arn  MD   Signed by:   Mariana Arn  MD on 01/09/2010   Method used:   Electronically to        [facility] (retail)       [address]       [place], [place]  [address]  Ph: [phone]       Fax: [phone]   [id number] GABAPENTIN 300  MG CAPS (GABAPENTIN) one tab by mouth three times a day  #90 x 3   Entered and Authorized by:   Mariana Arn  MD   Signed by:   Mariana Arn  MD on 01/09/2010   Method used:   Electronically to        [facility] (retail)       [address]       [place], [place]  [address]       Ph: [phone]       Fax: [phone]   RxID:   [id number] HYDRALAZINE HCL 25 MG TABS (HYDRALAZINE HCL) one tab by mouth three times a day  #90 x 3   Entered and Authorized by:   Mariana Arn  MD   Signed by:   Mariana Arn  MD on 01/09/2010   Method used:   Print then Give to Patient   [id number] ISOSORBIDE DINITRATE 20 MG TABS (ISOSORBIDE DINITRATE) one tab by mouth three times a day  #90 x 3   Entered and Authorized by:   Mariana Arn  MD   Signed by:   Mariana Arn  MD on 01/09/2010   Method used:   Print then Give to Patient   RxID:   [id number]   Laboratory Results   Blood Tests   Date/Time Received: January 09, 2010 2:47 PM  Date/Time Reported: January 09, 2010 3:02 PM   HGBA1C: 9.0%   (Normal Range: Non-Diabetic - 3-6%   Control Diabetic - 6-8%)  Comments: ...........test performed by...........Marland KitchenLevert Feinstein, LPN entered by Hedy Camara, CMA         Prevention & Chronic Care Immunizations   Influenza vaccine: Not documented    Tetanus booster: Not documented    Pneumococcal vaccine: Not documented  Colorectal Screening   Hemoccult: Not documented    Colonoscopy: Not documented  Other Screening   PSA: Not documented   Smoking status: never  (11/11/2009)  Diabetes Mellitus   HgbA1C: 9.0  (01/09/2010)   HgbA1C action/deferral: Ordered  (01/09/2010)   Hemoglobin A1C due: 11/28/2009    Eye exam: Not documented    Foot exam: yes  (01/09/2010)   Foot exam action/deferral: Do today   High risk foot: Not documented   Foot care education: Not documented    Urine microalbumin/creatinine ratio: Not documented    Diabetes  flowsheet reviewed?: Yes   Progress toward A1C goal: Improved  Lipids   Total Cholesterol: 193  (10/31/2009)   LDL: 89  (10/31/2009)   LDL Direct: Not documented   HDL: 46  (10/31/2009)   Triglycerides: 289  (10/31/2009)   Lipid panel due: 10/31/2009    SGOT (AST): 16  (10/31/2009)   SGPT (ALT): 14  (10/31/2009) CMP ordered    Alkaline phosphatase: 56  (10/31/2009)   Total bilirubin: 0.4  (10/31/2009)   Liver panel due: 10/11/2009    Lipid flowsheet reviewed?: Yes   Progress toward LDL goal: Unchanged  Hypertension   Last Blood Pressure: 162 / 83  (01/09/2010)   Serum creatinine: 1.63  (11/11/2009)   BMP action: Ordered   Serum potassium 4.3  (11/11/2009) CMP ordered    Basic metabolic panel due: 0000000    Hypertension flowsheet reviewed?: Yes   Progress toward BP goal: Unchanged  Self-Management Support :   Personal Goals (by  the next clinic visit) :     Personal A1C goal: 9  (08/30/2009)     Personal blood pressure goal: 140/90  (06/07/2009)     Personal LDL goal: 100  (06/16/2009)    Patient will work on the following items until the next clinic visit to reach self-care goals:     Medications and monitoring: take my medicines every day, check my blood sugar, check my blood pressure, bring all of my medications to every visit, weigh myself weekly, examine my feet every day  (01/09/2010)     Eating: drink diet soda or water instead of juice or soda, eat more vegetables, use fresh or frozen vegetables, eat foods that are low in salt, eat baked foods instead of fried foods, eat fruit for snacks and desserts, limit or avoid alcohol  (01/09/2010)     Activity: take a 30 minute walk every day  (01/09/2010)    Diabetes self-management support: CBG self-monitoring log, Written self-care plan  (09/06/2009)    Diabetes self-management support not done because: Good outcomes  (10/11/2009)    Hypertension self-management support: Written self-care plan, Education handout   (01/09/2010)   Hypertension self-care plan printed.   Hypertension education handout printed    Hypertension self-management support not done because: Good outcomes  (10/28/2009)    Lipid self-management support: Written self-care plan  (01/09/2010)   Lipid self-care plan printed.

## 2010-10-24 NOTE — Consult Note (Signed)
Summary: [facility]   Imported By: Raymond Gurney 02/23/2010 14:55:43  _____________________________________________________________________  External Attachment:    Type:   Image     Comment:   External Document

## 2010-10-24 NOTE — Assessment & Plan Note (Signed)
Summary: f/u   Vital Signs:  Patient profile:   54 year old male Height:      70 inches Weight:      321.8 pounds BMI:     46.34 Temp:     98.3 degrees F oral Pulse rate:   93 / minute BP sitting:   154 / 86  (left arm) Cuff size:   large  Vitals Entered By: Levert Feinstein LPN (November 16, 624THL 11:10 AM) CC: dyspnea, LE edema  Is Patient Diabetic? Yes Did you bring your meter with you today? No Pain Assessment Patient in pain? yes        Primary Care Provider:  Mariana Arn  MD  CC:  dyspnea and LE edema .  History of Present Illness: 1) CHF exacerbation: Reports shortness of breath, weight gain, LE edema, abdominal swelling, worsening orthopnea, PND, over the past month. Reports that he ran out of his torsemide about a month ago and was unable to afford it due to the fact that his Medicaid had not yet been approved. Was seen by his nephrologist and started on Lasix 80 mg two tabs by mouth two times a day - this has not helped at all.   ROS: Denies chest pain, nausea, emesis, abdominal pain.   Med Rec:  1)  Plavix 75 Mg Tabs (Clopidogrel Bisulfate) .... One Tablet By Mouth Dialy 2)  Lasix 80 mg by mouth two tabs two times a day  3)  Crestor 20 Mg Tabs (Rosuvastatin Calcium) .... One Tablet By Mouth Daily 4)  Coreg 25 Mg Tabs (Carvedilol) .... One Tab By Mouth Two Times A Day 5)  Lantus 100 Unit/ml Soln (Insulin Glargine) .... 50 Units Twice Daily.  Then Increase By 1 Unit in The Am and Pm If Morning Cbgs Is Still Higher Than 100.  Dispense 1 Month Supply Please 6)  Bd Insulin Syringe Ultrafine 30g X 1/2" 1 Ml Misc (Insulin Syringe-Needle U-100) .... Dispense One Month Supply For Once Daily Dosing.  Dispense 1 Box. 7)  Isosorbide Dinitrate 20 Mg Tabs (Isosorbide Dinitrate) .... One Tab By Mouth Three Times A Day 8)  Hydralazine Hcl 25 Mg Tabs (Hydralazine Hcl) .... One Tab By Mouth Three Times A Day 9)  Gabapentin 300 Mg Caps (Gabapentin) .... One Tab By Mouth Three Times A  Day 10)  Amlodipine Besylate 10 Mg Tabs (Amlodipine Besylate) .... One Tab By Mouth Qday    Habits & Providers  Alcohol-Tobacco-Diet     Tobacco Status: quit     Year Quit: 25 yr  Allergies: No Known Drug Allergies  Physical Exam  General:  obese, NAD, vitals reviewed.   Mouth:  Oral mucosa moist, fair dentition. Lungs:  CTAB w/ bibasilar crackles  Heart:  Normal rate and regular rhythm. S1 and S2 normal without gallop, murmur, click, rub or other extra sounds. Abdomen:  obese, somewhat distended, no tenderness.  No fluid wave palpated.  No masses.  Good bowel sounds.   Pulses:  2+ radials  Extremities:  2+ pitting edema    Impression & Recommendations:  Problem # 1:  CHRONIC SYSTOLIC HEART FAILURE (123456) Assessment Deteriorated  Exacerbation secondary to medication non-adherence due to cost (has failed Lasix in the past). Refilled Torsemide. Added on HCTZ in short term. Will follow Cr, BNP. Follow up next week. Continue other medications as per medlist.   His updated medication list for this problem includes:    Plavix 75 Mg Tabs (Clopidogrel bisulfate) ..... One tablet by mouth  dialy    Torsemide 100 Mg Tabs (Torsemide) ..... One tabs each morning by mouth twice a day    Coreg 25 Mg Tabs (Carvedilol) ..... One tab by mouth two times a day    Hydrochlorothiazide 25 Mg Tabs (Hydrochlorothiazide) ..... One tab by mouth qday x 3 days  Orders: [facility]- Est Level  3 [id number])  Complete Medication List: 1)  Plavix 75 Mg Tabs (Clopidogrel bisulfate) .... One tablet by mouth dialy 2)  Torsemide 100 Mg Tabs (Torsemide) .... One tabs each morning by mouth twice a day 3)  Crestor 20 Mg Tabs (Rosuvastatin calcium) .... One tablet by mouth daily 4)  Coreg 25 Mg Tabs (Carvedilol) .... One tab by mouth two times a day 5)  Lantus 100 Unit/ml Soln (Insulin glargine) .... 50 units twice daily.  then increase by 1 unit in the am and pm if morning cbgs is still higher than 100.  dispense 1  month supply please 6)  Bd Insulin Syringe Ultrafine 30g X 1/2" 1 Ml Misc (Insulin syringe-needle u-100) .... Dispense one month supply for once daily dosing.  dispense 1 box. 7)  Isosorbide Dinitrate 20 Mg Tabs (Isosorbide dinitrate) .... One tab by mouth three times a day 8)  Hydralazine Hcl 25 Mg Tabs (Hydralazine hcl) .... One tab by mouth three times a day 9)  Gabapentin 300 Mg Caps (Gabapentin) .... One tab by mouth three times a day 10)  Amlodipine Besylate 10 Mg Tabs (Amlodipine besylate) .... One tab by mouth qday 11)  Cpap 15 Cwp Ahi 0 Per Hour.  .... Medium resmed mirage quattro full face mask w/ heated humidifier. wear at night every night 12)  Hydrochlorothiazide 25 Mg Tabs (Hydrochlorothiazide) .... One tab by mouth qday x 3 days  Other Orders: Basic Met[facility] [phone]) B Nat Peptide-[facility] [phone])  Patient Instructions: 1)  Come back in tomorrow and Friday for weight checks. 2)  Pick up your torsemide and start taking immediately 3)  We will check kidney labs today. 4)  I will add on hydrochlorothiazide today for you to take for next three days.  5)  If you are feeling more short of breath give [place] a call  6)  APPOINTMENTS 7)  WEIGHT CHECK ON 11/17 8)  WEIGHT CHECK ON 11/18 9)  FOLLOW UP WITH DR. Sherilyn Cooter NEXT WEEK  Prescriptions: TORSEMIDE 100 MG TABS (TORSEMIDE) one tabs each morning by mouth twice a day  #60 x 1   Entered and Authorized by:   Mariana Arn  MD   Signed by:   Mariana Arn  MD on 08/09/2010   Method used:   Electronically to        [facility] (retail)       [place], [place]  [address]       Ph: [phone]       Fax: [phone]   RxID:   [id number] TORSEMIDE 100 MG TABS (TORSEMIDE) one tabs each morning by mouth  #30 x 3   Entered and Authorized by:   Mariana Arn  MD   Signed by:   Mariana Arn  MD on 08/09/2010   Method used:   Electronically to        [facility] (retail)        [place], [place]  [id number]       Ph: [phone]       Fax: [phone]   [id number] HYDROCHLOROTHIAZIDE 25 MG TABS (HYDROCHLOROTHIAZIDE) one tab by mouth qday x 3 days  #3 x 0   Entered and Authorized by:   Mariana Arn  MD   Signed by:   Mariana Arn  MD on 08/09/2010   Method used:   Electronically to        [facility] (retail)       [address]       [place], [place]  [address]       Ph: [phone]       Fax: [phone]   RxID:   [id number]    Orders Added: 1)  Basic Met[facility] [phone] 2)  B Nat Peptide-[facility] [[id number]] 3)  [place]- Est Level  3 [id number]

## 2010-10-24 NOTE — Assessment & Plan Note (Signed)
Summary: Hospital follow up    Vital Signs:  Patient profile:   55 year old male Weight:      304 pounds Temp:     98.3 degrees F Pulse rate:   102 / minute BP sitting:   157 / 65  Vitals Entered By: Jonathon Feinstein LPN (June  1, 624THL [id number] PM)  Primary Care Provider:  Mariana Arn  MD  CC:  Hospital follow up. Marland Kitchen  History of Present Illness: Hospital follow up 5/17 - 5/26 for  1) Concern for TIA: Presented with left sided numbness and vision changes - felt to be secondary to hypoglycemia rather than TIA (given negative workup). [name] was continued on Plavix. Denies further neurological symptoms since discharge.   2) CHF: Admitted with volume overload (3+ edema, bibasilar crackles) only received one dose of Lasix 40 mg I Vx 1 dose, began to have excellent urine output and diuresed on his own. Seen by Dr. Claiborne Bailey during course of hospitalization - Myoview done at his office showed concern for new ischemic disease - catheterization could not be performed secondary to ARF as below. Outpatient follow up with cardiology was arranged and patient was seen on 02/21/10 (records not yet available). Denies chest pain, increased dyspnea. Reports improving LE edema since last visit.   3) ARF: Baseline 1.4 to 1.8. Creatinine on admit was 2.5.  Seen by Jonathon Bailey (Dr. Jimmy Bailey) several years ago, but did not follow up due to lack of funds. Renal service was consulted during hospitalization. Renal function improved during course of hospitalization to 1.9 on discharge w/o intervention except IV fluids. Likely pre-renal failure secondary to hypoperfusion with CHF exacerbation. Has not yet contacted Dr. Jimmy Bailey regarding follow up.   4) DM2: Hypoglycemia felt to be cause of neurological symptoms (left sided weakness, vision change). Changed from 70 units two times a day to 30 units two times a day Lantus. Glipizide discontinued.   5) Sleep apnea: Started on CPAP during hospitalization.  Referral placed for sleep study to allow his Medicaid to cover cost of machine.   see med rec for changes to medications.   Current Medications (verified): 1)  Plavix 75 Mg Tabs (Clopidogrel Bisulfate) .... One Tablet By Mouth Dialy 2)  Torsemide 100 Mg Tabs (Torsemide) .... One Tabs Each Morning By Mouth 3)  Crestor 20 Mg Tabs (Rosuvastatin Calcium) .... One Tablet By Mouth Daily 4)  Lisinopril 40 Mg Tabs (Lisinopril) .... Take One Tablet By Mouth Daily 5)  Coreg 25 Mg Tabs (Carvedilol) .... One Tab By Mouth Two Times A Day 6)  Lantus 100 Unit/ml Soln (Insulin Glargine) .... 30 Units Twice Daily.  Then Increase By 1 Unit in The Am and Pm If Morning Cbgs Is Still Higher Than 100.  Dispense 1 Month Supply Please 7)  Bd Insulin Syringe Ultrafine 30g X 1/2" 1 Ml Misc (Insulin Syringe-Needle U-100) .... Dispense One Month Supply For Once Daily Dosing.  Dispense 1 Box. 8)  Isosorbide Dinitrate 20 Mg Tabs (Isosorbide Dinitrate) .... One Tab By Mouth Three Times A Day 9)  Hydralazine Hcl 25 Mg Tabs (Hydralazine Hcl) .... One Tab By Mouth Three Times A Day 10)  Gabapentin 300 Mg Caps (Gabapentin) .... One Tab By Mouth Three Times A Day  Allergies (verified): No Known Drug Allergies  Review of Systems       as per HPI o/w negative   Physical Exam  General:  obese, NAD  Mouth:  Oral mucosa moist, fair dentition. Neck:  no JVD  Lungs:  CTAB w/o crackles,  Heart:  Normal rate and regular rhythm. S1 and S2 normal without gallop, murmur, click, rub or other extra sounds. Abdomen:  obese, somewhat distended, no tenderness.  No fluid wave palpated.  No masses.  Good bowel sounds.   Pulses:  DP pulses palpated bilaterally  Extremities:  trace edema  Neurologic:  alert & oriented X3 and cranial nerves II-XII intact.     Impression & Recommendations:  Problem # 1:  DIABETES MELLITUS, TYPE II, UNCONTROLLED (ICD-250.02) Assessment Unchanged  Follow up in three months. Reviewed diet and exercise  for 25 minutes. Medications as below.   The following medications were removed from the medication list:    Glipizide 5 Mg Tabs (Glipizide) ..... One tab by mouth two times a day His updated medication list for this problem includes:    Lisinopril 40 Mg Tabs (Lisinopril) .Marland Kitchen... Take one tablet by mouth daily    Lantus 100 Unit/ml Soln (Insulin glargine) .Marland KitchenMarland KitchenMarland KitchenMarland Kitchen 30 units twice daily.  then increase by 1 unit in the am and pm if morning cbgs is still higher than 100.  dispense 1 month supply please  Labs Reviewed: Creat: 2.01 (01/09/2010)    Reviewed HgBA1c results: 9.0 (01/09/2010)  11.4 (08/30/2009)  Orders: [place]- Est  Level 4 (99214)Future Orders: [facility] [id number] ... 03/01/2011 Direct [facility] [id number]) ... 03/01/2011  Problem # 2:  CEREBROVASCULAR ACCIDENT, HX OF (ICD-V12.50) Assessment: Unchanged Continue Plavix. Follow up three months.   Problem # 3:  OBSTRUCTIVE SLEEP APNEA (ICD-327.23) Assessment: Unchanged Order placed for sleep study. CPAP. Likely element of obesity hypoventilation syndrome as well.   Problem # 4:  CONGESTIVE HEART FAILURE, SYSTOLIC DYSFUNCTION (A999333) Assessment: Unchanged  Will review cardiology notes when available. Medications as below. DASH diet.    His updated medication list for this problem includes:    Plavix 75 Mg Tabs (Clopidogrel bisulfate) ..... One tablet by mouth dialy    Torsemide 100 Mg Tabs (Torsemide) ..... One tabs each morning by mouth    Lisinopril 40 Mg Tabs (Lisinopril) .Marland Kitchen... Take one tablet by mouth daily    Coreg 25 Mg Tabs (Carvedilol) ..... One tab by mouth two times a day  Orders: [place]- Est  Level 4 [id number])  Problem # 5:  RENAL DISEASE, CHRONIC (ICD-593.9) Assessment: Unchanged  Follow up with Dr. Jimmy Bailey of [facility]. Will obtain records when available. Continue current medications. Will follow in three months. Check labs in two weeks.   Orders: [place]- Est  Level 4 [id number])  Complete Medication  List: 1)  Plavix 75 Mg Tabs (Clopidogrel bisulfate) .... One tablet by mouth dialy 2)  Torsemide 100 Mg Tabs (Torsemide) .... One tabs each morning by mouth 3)  Crestor 20 Mg Tabs (Rosuvastatin calcium) .... One tablet by mouth daily 4)  Lisinopril 40 Mg Tabs (Lisinopril) .... Take one tablet by mouth daily 5)  Coreg 25 Mg Tabs (Carvedilol) .... One tab by mouth two times a day 6)  Lantus 100 Unit/ml Soln (Insulin glargine) .... 30 units twice daily.  then increase by 1 unit in the am and pm if morning cbgs is still higher than 100.  dispense 1 month supply please 7)  Bd Insulin Syringe Ultrafine 30g X 1/2" 1 Ml Misc (Insulin syringe-needle u-100) .... Dispense one month supply for once daily dosing.  dispense 1 box. 8)  Isosorbide Dinitrate 20 Mg Tabs (Isosorbide dinitrate) .... One tab by mouth three times a day 9)  Hydralazine Hcl 25  Mg Tabs (Hydralazine hcl) .... One tab by mouth three times a day 10)  Gabapentin 300 Mg Caps (Gabapentin) .... One tab by mouth three times a day

## 2010-10-24 NOTE — Assessment & Plan Note (Signed)
Summary: F/U/RH   Vital Signs:  Patient profile:   55 year old male Height:      70 inches Weight:      310.06 pounds BMI:     44.65 Temp:     98.1 degrees F oral Pulse rate:   98 / minute BP sitting:   132 / 84  (left arm)  Vitals Entered By: Hedy Camara (August 30, 2010 9:51 AM) CC: F/U Is Patient Diabetic? Yes Pain Assessment Patient in pain? no        Primary Care Provider:  Mariana Arn  MD  CC:  F/U.  History of Present Illness: 1) CHF: Weight down 12 lbs since seen for exacerbation at last visit on 11/16. Diuresis has decreased since last visit (only 2 lbs down since 11/21). Reports that LE edema has improved somewhat and that dyspnea has improved, though still has baseline orthopnea. Started on torsemide 100 mg two times a day and HCTZ 25 mg daily on 11/16. Also on beta-blocker, hydralazine, ACE-I - had been on isosorbide dinitrate as well but self-discontinued as he thought it might be causing constipation.   2) Sleep apnea: Started on CPAP since last visit. Reports improved sleep with fewer episodes of nighttime waking. History of HTN as well.   3) HTN: Near goal today. CCB, BBlocker, ACE-I, HCTZ, torsemide, hydralazine, isosorbide dinitrate (restarted at last visit). Reports that the isosorbide dinitrate makes him feel groggy if he takes it during the day. Continues to eat salty foods, not follow DASH diet (though we have discussed this many times). Denies chest pain, focal neurological signs.   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 Had started on HCTZ and furosemide 11/16 given CHF exacerbation. Cr increased from 2.06 to 2.87 with starting these. Diuresis not as brisk as before, though still urinating frequently.      Habits & Providers  Alcohol-Tobacco-Diet     Tobacco Status: quit > 6 months  Current Medications (verified): 1)  Plavix 75 Mg Tabs (Clopidogrel Bisulfate) .... One Tablet By Mouth Dialy 2)  Torsemide 100 Mg Tabs (Torsemide)  .... One Tabs Each Morning By Mouth Twice A Day 3)  Crestor 20 Mg Tabs (Rosuvastatin Calcium) .... One Tablet By Mouth Daily 4)  Coreg 25 Mg Tabs (Carvedilol) .... One Tab By Mouth Two Times A Day 5)  Lantus 100 Unit/ml Soln (Insulin Glargine) .... 50 Units Twice Daily.  Then Increase By 1 Unit in The Am and Pm If Morning Cbgs Is Still Higher Than 100.  Dispense 1 Month Supply Please 6)  Bd Insulin Syringe Ultrafine 30g X 1/2" 1 Ml Misc (Insulin Syringe-Needle U-100) .... Dispense One Month Supply For Once Daily Dosing.  Dispense 1 Box. 7)  Isosorbide Dinitrate 20 Mg Tabs (Isosorbide Dinitrate) .... One Tab By Mouth Three Times A Day 8)  Hydralazine Hcl 25 Mg Tabs (Hydralazine Hcl) .... One Tab By Mouth Three Times A Day 9)  Gabapentin 300 Mg Caps (Gabapentin) .... One Tab By Mouth Three Times A Day 10)  Amlodipine Besylate 10 Mg Tabs (Amlodipine Besylate) .... One Tab By Mouth Qday 11)  Cpap 15 Cwp Ahi 0 Per Hour. .... Medium Resmed Mirage Quattro Full Face Mask W/ Heated Humidifier. Wear At Night Every Night 12)  Lisinopril 40 Mg Tabs (Lisinopril) .... One Tab By Mouth Qday 13)  Glipizide 5 Mg Tabs (Glipizide) .... One Tab By Mouth Two Times A Day 14)  Hydrochlorothiazide 25 Mg Tabs (Hydrochlorothiazide) .... One  Tab By Mouth Qday  Allergies (verified): No Known Drug Allergies  Past History:  Past Medical History: Last updated: 09/21/2008  1. Diabetes.   2. Hypertension.   3. Hyperlipidemia.   4. Coronary artery disease, status post MI x2 and 3 stents placed in       approximately 2003.   5. Diastolic dysfunction, mild (echo in 2008)   6. History of hospitalization in 1/09, 12/09 for community-acquired       pneumonia.   7. Questionable history of transient ischemic attack.   8. Obstructive sleep apnea with CPAP mission at home; however, the       patient is not using.   9. Proteinuria.   10. Status post hernia repair x2.   11.Status post normal colonoscopy in March 2009.    Family History: Last updated: 09/21/2008 - Father who has stroke, rheumatic fever, MI, CABG x4, prostate cancer, and DVTs.   - Mother with asthma  - Aunt with DVT's.      Social History: Smoking Status:  quit > 6 months  Physical Exam  General:  obese, NAD, vitals reviewed.   Mouth:  Oral mucosa moist, fair dentition. Neck:  no JVD  Lungs:  CTAB throughout all lung fields  Heart:  Distant heart sounds but rormal rate and regular rhythm. S1 and S2 normal without gallop, murmur, click, rub or other extra sounds. Abdomen:  obese, somewhat distended, no tenderness.  No fluid wave palpated.  No masses.  Good bowel sounds.   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 Follow up with [facility]. BP appears to be better controlled today. Will follow in one month.   Problem # 2:  OBSTRUCTIVE SLEEP APNEA (ICD-327.23) Assessment: Improved  Stable. Continue CPAP. Likely element of obesity hypoventilation syndrome as well.   Orders: [place]- Est  Level 4 [id number])  Problem # 3:  ESSENTIAL HYPERTENSION (ICD-401.9) Assessment: Improved  His updated medication list for this problem includes:    Torsemide 100 Mg Tabs (Torsemide) ..... One tabs each morning by mouth twice a day    Coreg 25 Mg Tabs (Carvedilol) ..... One tab by mouth two times a day    Hydralazine Hcl 25 Mg Tabs (Hydralazine hcl) ..... One tab by mouth three times a day    Amlodipine Besylate 10 Mg Tabs (Amlodipine besylate) ..... One tab by mouth qday    Lisinopril 40 Mg Tabs (Lisinopril) ..... One tab by mouth qday    Hydrochlorothiazide 25 Mg Tabs (Hydrochlorothiazide) ..... One tab by mouth qday  Near goal today (much better than before). Difficult to control due to medication and dietary and activity recommendation  non-adherence. Readdressed need for DASH diet, exercise. Medications as below. Will follow renal function with attempts to diurese. Follow up  one month.  His updated medication list for this problem includes:    Torsemide 100 Mg Tabs (Torsemide) ..... One tabs each morning by mouth twice a day    Coreg 25 Mg Tabs (Carvedilol) ..... One tab by mouth two times a day    Hydralazine Hcl 25 Mg Tabs (Hydralazine hcl) ..... One tab by mouth three times a day    Amlodipine Besylate 10 Mg Tabs (Amlodipine besylate) ..... One tab by mouth qday    Lisinopril 40 Mg Tabs (Lisinopril) ..... One tab by mouth  qday    Hydrochlorothiazide 25 Mg Tabs (Hydrochlorothiazide) ..... One tab by mouth qday  BP today: 132/84 Prior BP: 156/78 (08/14/2010)  Labs Reviewed: K+: 3.8 (08/14/2010) Creat: : 2.86 (08/14/2010)   Chol: 193 (10/31/2009)   HDL: 46 (10/31/2009)   LDL: 89 (10/31/2009)   TG: 289 (10/31/2009)  Orders: [place]- Est  Level 4 (99214)  Problem # 4:  CHRONIC SYSTOLIC HEART FAILURE (123456) Assessment: Unchanged  Continue with torsemide / HCTZ. Will check renal function today given history of CRI. Follow up in one month. Continue BBlocker, ACE-I, hydralazine, isosorbide dinitrate.   His updated medication list for this problem includes:    Plavix 75 Mg Tabs (Clopidogrel bisulfate) ..... One tablet by mouth dialy    Torsemide 100 Mg Tabs (Torsemide) ..... One tabs each morning by mouth twice a day    Coreg 25 Mg Tabs (Carvedilol) ..... One tab by mouth two times a day    Lisinopril 40 Mg Tabs (Lisinopril) ..... One tab by mouth qday    Hydrochlorothiazide 25 Mg Tabs (Hydrochlorothiazide) ..... One tab by mouth qday  Orders: [place]- Est  Level 4 [id number])  Complete Medication List: 1)  Plavix 75 Mg Tabs (Clopidogrel bisulfate) .... One tablet by mouth dialy 2)  Torsemide 100 Mg Tabs (Torsemide) .... One tabs each morning by mouth twice a day 3)  Crestor 20 Mg Tabs (Rosuvastatin calcium) .... One  tablet by mouth daily 4)  Coreg 25 Mg Tabs (Carvedilol) .... One tab by mouth two times a day 5)  Lantus 100 Unit/ml Soln (Insulin glargine) .... 50 units twice daily.  then increase by 1 unit in the am and pm if morning cbgs is still higher than 100.  dispense 1 month supply please 6)  Bd Insulin Syringe Ultrafine 30g X 1/2" 1 Ml Misc (Insulin syringe-needle u-100) .... Dispense one month supply for once daily dosing.  dispense 1 box. 7)  Isosorbide Dinitrate 20 Mg Tabs (Isosorbide dinitrate) .... One tab by mouth three times a day 8)  Hydralazine Hcl 25 Mg Tabs (Hydralazine hcl) .... One tab by mouth three times a day 9)  Gabapentin 300 Mg Caps (Gabapentin) .... One tab by mouth three times a day 10)  Amlodipine Besylate 10 Mg Tabs (Amlodipine besylate) .... One tab by mouth qday 11)  Cpap 15 Cwp Ahi 0 Per Hour.  .... Medium resmed mirage quattro full face mask w/ heated humidifier. wear at night every night 12)  Lisinopril 40 Mg Tabs (Lisinopril) .... One tab by mouth qday 13)  Glipizide 5 Mg Tabs (Glipizide) .... One tab by mouth two times a day 14)  Hydrochlorothiazide 25 Mg Tabs (Hydrochlorothiazide) .... One tab by mouth qday  Other Orders: [facility] [id number])  Patient Instructions: 1)  Look at all of your meals - add up the sodium - if it is more than 2000 mg then [place]  2)  Follow up with me in one month. 3)  Take over the counter cough medication (make sure that the cough medication does not have Sudafed or pseudoephedrine, so that it is safe to use with high blood pressure). It will have a red heart on the box to show that it is safe to use. 4)      Orders Added: 1)  [facility] [id number] 2)  [facility]- Est  Level 4 [id number]

## 2010-10-24 NOTE — Miscellaneous (Signed)
Summary: CHF QI  Clinical Lists Changes  Problems: Changed problem from CONGESTIVE HEART FAILURE, SYSTOLIC DYSFUNCTION (A999333) to CHRONIC SYSTOLIC HEART FAILURE (123456)

## 2010-10-24 NOTE — Assessment & Plan Note (Signed)
Summary: retaining fluid,df   Vital Signs:  Patient profile:   55 year old male Weight:      293.8 pounds Temp:     97.9 degrees F Pulse rate:   102 / minute BP sitting:   137 / 81  (left arm)  Vitals Entered By: Geanie Cooley RN (October 28, 2009 10:22 AM) CC: retaining fluids Is Patient Diabetic? Yes Pain Assessment Patient in pain? no        Primary Care Provider:  Mariana Arn  MD  CC:  retaining fluids.  History of Present Illness: 1) Fluid retention: Patient reports worsening extremity edema, dyspnea, orthopnea, PND since starting Lantus in January. Reports urinating less since starting insulin therapy. Taking his Lasix 160 mg in the AM and 80 mg at night, though he has not taken today. Edema is in his hands, LE up to his knees and he feels as though his abdomen is swollen. Has gained about 18 lbs since last visit with me in December. Denies chest pain, pleuritic pain, wheezing, cough, fever, chills.    2) DM2: Started on Lantus in January 2011. Titrating dose up with goal of fasting CBG < 100. CUrrently fasting CBGs 80's - 100 on Lantus 110 units qday over past few days.. Reports that he is urinating less, has had decreased thirst.   3) HTN: Denies medication side effects. Denies chest pain. Reports dyspnea, LE edema as above. Using sea salt instead of regular salt. Has cut back on fried foods, increased vegetables.   Habits & Providers  Alcohol-Tobacco-Diet     Tobacco Status: quit     Year Quit: 25 yr  Current Medications (verified): 1)  Plavix 75 Mg Tabs (Clopidogrel Bisulfate) .... One Tablet By Mouth Dialy 2)  Torsemide 100 Mg Tabs (Torsemide) .... Two Tabs Each Morning By Mouth 3)  Crestor 20 Mg Tabs (Rosuvastatin Calcium) .... One Tablet By Mouth Daily 4)  Glipizide 5 Mg Tabs (Glipizide) .... One Tab By Mouth Two Times A Day 5)  Lisinopril 40 Mg Tabs (Lisinopril) .... Take One Tablet By Mouth Daily 6)  Coreg 25 Mg Tabs (Carvedilol) .... One Tab By Mouth Two  Times A Day 7)  Amlodipine Besylate 10 Mg Tabs (Amlodipine Besylate) .... Take One Tablet By Mouth Daily 8)  Lantus 100 Unit/ml Soln (Insulin Glargine) .... 45 Units Twice Daily.  Then Increase By 1 Unit in The Am and Pm If Morning Cbgs Is Still Higher Than 100.  Dispense 1 Month Supply. 9)  Bd Insulin Syringe Ultrafine 30g X 1/2" 1 Ml Misc (Insulin Syringe-Needle U-100) .... Dispense One Month Supply For Once Daily Dosing.  Dispense 1 Box.  Allergies (verified): No Known Drug Allergies  Social History: Smoking Status:  quit  Physical Exam  General:  obese, dysarthria appears stable as compared to my prior evaluation. NAD   Neck:  no bruits, +JVD Lungs:  crackles at bases bilaterally Heart:  RRR no murmurs  Abdomen:  ascites, obese, +BS Pulses:  2+ pedals Extremities:  2+ pitting edema to knees   Impression & Recommendations:  Problem # 1:  CONGESTIVE HEART FAILURE, SYSTOLIC DYSFUNCTION (A999333) Assessment Deteriorated  Decompensation in CHF likely secondary to improved glucose control with subsequent loss of diuretic effect of urinary glucose. Will switch to torsemide max dose given this, also given gut edema, follow up early next week w/ goal of preventing hospitalization.  His updated medication list for this problem includes:    Plavix 75 Mg Tabs (Clopidogrel bisulfate) .Marland KitchenMarland KitchenMarland KitchenMarland Kitchen  One tablet by mouth dialy    Torsemide 100 Mg Tabs (Torsemide) .Marland Kitchen..Marland Kitchen Two tabs each morning by mouth    Lisinopril 40 Mg Tabs (Lisinopril) .Marland Kitchen... Take one tablet by mouth daily    Coreg 25 Mg Tabs (Carvedilol) ..... One tab by mouth two times a day  Orders: [place]- Est  Level 4 [id number])  Problem # 2:  DIABETES MELLITUS, TYPE II, UNCONTROLLED (ICD-250.02) Assessment: Unchanged  Sugars at goal on 110 units of Lantus. Reviewed goals. Will check A1C three months after previous.  His updated medication list for this problem includes:    Glipizide 5 Mg Tabs (Glipizide) ..... One tab by mouth two times a  day    Lisinopril 40 Mg Tabs (Lisinopril) .Marland Kitchen... Take one tablet by mouth daily    Lantus 100 Unit/ml Soln (Insulin glargine) .Marland KitchenMarland KitchenMarland KitchenMarland Kitchen 45 units twice daily.  then increase by 1 unit in the am and pm if morning cbgs is still higher than 100.  dispense 1 month supply.  Labs Reviewed: Creat: 1.97 (08/30/2009)    Reviewed HgBA1c results: 11.4 (08/30/2009)  10.4 (05/10/2009)  Orders: [place]- Est  Level 4 [id number])  Problem # 3:  ESSENTIAL HYPERTENSION (ICD-401.9) Assessment: Unchanged  Will switch to torsemide as above. Likely at goal (did not take his lasix this AM) recheck BMET for Cr. Follow up next week.  His updated medication list for this problem includes:    Torsemide 100 Mg Tabs (Torsemide) .Marland Kitchen..Marland Kitchen Two tabs each morning by mouth    Lisinopril 40 Mg Tabs (Lisinopril) .Marland Kitchen... Take one tablet by mouth daily    Coreg 25 Mg Tabs (Carvedilol) ..... One tab by mouth two times a day    Amlodipine Besylate 10 Mg Tabs (Amlodipine besylate) .Marland Kitchen... Take one tablet by mouth daily  Orders: [facility]- Est  Level 4 [id number])  Complete Medication List: 1)  Plavix 75 Mg Tabs (Clopidogrel bisulfate) .... One tablet by mouth dialy 2)  Torsemide 100 Mg Tabs (Torsemide) .... Two tabs each morning by mouth 3)  Crestor 20 Mg Tabs (Rosuvastatin calcium) .... One tablet by mouth daily 4)  Glipizide 5 Mg Tabs (Glipizide) .... One tab by mouth two times a day 5)  Lisinopril 40 Mg Tabs (Lisinopril) .... Take one tablet by mouth daily 6)  Coreg 25 Mg Tabs (Carvedilol) .... One tab by mouth two times a day 7)  Amlodipine Besylate 10 Mg Tabs (Amlodipine besylate) .... Take one tablet by mouth daily 8)  Lantus 100 Unit/ml Soln (Insulin glargine) .... 45 units twice daily.  then increase by 1 unit in the am and pm if morning cbgs is still higher than 100.  dispense 1 month supply. 9)  Bd Insulin Syringe Ultrafine 30g X 1/2" 1 Ml Misc (Insulin syringe-needle u-100) .... Dispense one month supply for once daily dosing.  dispense 1  box.  Other Orders: [facility] [phone])  Patient Instructions: 1)  Stop taking Lasix (furosemide) 2)  Start taking torsemide 200 mg each morning 3)  Come back to see me early next week.  4)  Keep taking your Lantus as directed.  Prescriptions: TORSEMIDE 100 MG TABS (TORSEMIDE) two tabs each morning by mouth  #60 x 3   Entered and Authorized by:   Mariana Arn  MD   Signed by:   Mariana Arn  MD on 10/28/2009   Method used:   Electronically to        [facility] [address] (retail)       [place]  [place],   [address]       Ph: [phone]       Fax: [phone]   RxID:   [id number]    Prevention & Chronic Care Immunizations   Influenza vaccine: Not documented    Tetanus booster: Not documented    Pneumococcal vaccine: Not documented  Colorectal Screening   Hemoccult: Not documented    Colonoscopy: Not documented  Other Screening   PSA: Not documented   Smoking status: quit  (10/28/2009)  Diabetes Mellitus   HgbA1C: 11.4  (08/30/2009)   Hemoglobin A1C due: 11/28/2009    Eye exam: Not documented    Foot exam: Not documented   High risk foot: Not documented   Foot care education: Not documented    Urine microalbumin/creatinine ratio: Not documented    Diabetes flowsheet reviewed?: Yes   Progress toward A1C goal: Improved  Lipids   Total Cholesterol: 295  (05/10/2009)   LDL: See Comment mg/dL  (05/10/2009)   LDL Direct: Not documented   HDL: 43  (05/10/2009)   Triglycerides: 640  (05/10/2009)   Lipid panel due: 10/31/2009    SGOT (AST): 13  (07/11/2009)   SGPT (ALT): 15  (07/11/2009)   Alkaline phosphatase: 64  (07/11/2009)   Total bilirubin: 0.4  (07/11/2009)   Liver panel due: 10/11/2009    Lipid flowsheet reviewed?: Yes   Progress toward LDL goal: Unchanged  Hypertension   Last Blood Pressure: 137 / 81  (10/28/2009)   Serum creatinine: 1.97  (08/30/2009)   BMP action: Ordered   Serum  potassium 4.1  ([id number])   Basic metabolic panel due: 0000000    Hypertension flowsheet reviewed?: Yes   Progress toward BP goal: Improved  Self-Management Support :   Personal Goals (by the next clinic visit) :     Personal A1C goal: 9  (08/30/2009)     Personal blood pressure goal: 140/90  (06/07/2009)     Personal LDL goal: 100  (06/16/2009)    Patient will work on the following items until the next clinic visit to reach self-care goals:     Medications and monitoring: take my medicines every day, check my blood sugar, check my blood pressure, bring all of my medications to every visit, weigh myself weekly, examine my feet every day  (10/28/2009)     Eating: drink diet soda or water instead of juice or soda, eat more vegetables, use fresh or frozen vegetables, eat foods that are low in salt, eat baked foods instead of fried foods, eat fruit for snacks and desserts, limit or avoid alcohol  (10/28/2009)     Activity: take a 30 minute walk every day  (10/28/2009)    Diabetes self-management support: CBG self-monitoring log, Written self-care plan  (09/06/2009)    Diabetes self-management support not done because: Good outcomes  (10/11/2009)    Hypertension self-management support: BP self-monitoring log, Written self-care plan, Education handout, Referred for self-management class  (08/30/2009)    Hypertension self-management support not done because: Good outcomes  (10/28/2009)    Lipid self-management support: Written self-care plan  (10/28/2009)   Lipid self-care plan printed.

## 2010-10-24 NOTE — Assessment & Plan Note (Signed)
Summary: wt check/kh  Nurse Visit weight check today 317.0. patient states breathing is maybe a little better. no worse. taking meds as directed. will return tomorrow for  repeat weight check. Marcell Barlow RN  August 10, 2010 9:09 AM   Vital Signs:  Patient profile:   55 year old male Weight:      317 pounds  Vitals Entered By: Marcell Barlow RN (August 10, 2010 12:10 PM)  Allergies: No Known Drug Allergies  Orders Added: 1)  No Charge Patient Arrived (NCPA0) [NCPA0]

## 2010-10-24 NOTE — Progress Notes (Signed)
Summary: refill  Phone Note Refill Request Call back at Home Phone [phone] Message from:  Patient  Refills Requested: Medication #1:  COREG 25 MG TABS One tab by mouth two times a day [place]  Initial call taken by: Audie Clear,  August 02, 2010 1:46 PM  Follow-up for Phone Call        Please let know that script is at [facility] pickup. Thanks! Khary   Follow-up by: Mariana Arn  MD,  August 03, 2010 12:22 PM

## 2010-10-24 NOTE — Progress Notes (Signed)
----   Converted from flag ---- ---- 03/15/2010 2:24 PM, Mariana Arn  MD wrote: Cancelled sleep study appointment for 03/10/10 - note in chart ------------------------------

## 2010-10-24 NOTE — Progress Notes (Signed)
  Phone Note Call from Patient   Caller: Patient Call For: [phone] Summary of Call: Please re write rx for CPAP machine.  Pt now have medicaid  Initial call taken by: Eusebio Friendly,  July 26, 2010 3:11 PM  Follow-up for Phone Call        Patient should have script for this already - will call to find out why does not still have script.  Follow-up by: Mariana Arn  MD,  July 28, 2010 2:43 PM    Prescriptions: CPAP 15 CWP AHI 0 PER HOUR. Medium ResMed Mirage Quattro full face mask w/ heated humidifier. Wear at night every night  #1 x 0   Entered and Authorized by:   Mariana Arn  MD   Signed by:   Mariana Arn  MD on 08/01/2010   Method used:   Print then Give to Patient   [id number]   Appended Document:  pt has misplaced rx for cpap b/c he was having a hard time getting medicaid straight, now he has it and needs rx.

## 2010-10-24 NOTE — Progress Notes (Signed)
Summary: triage  Phone Note Call from Patient Call back at Home Phone [phone]   Caller: Patient Summary of Call: Does not think the last fluid pills are working.  Not going to bathroom very much.  And since your visit he has picked up about 20 pounds.  Feels very bad. Initial call taken by: Raymond Gurney,  November 11, 2009 11:06 AM  Follow-up for Phone Call         states he is much more swollen.states he feels "so-so" "Like a blimp" taking  all meds. labored breathing noted. states he is having more trouble breathing. to be here at 1:30 for work in appt Follow-up by: Elige Radon RN,  November 11, 2009 11:16 AM

## 2010-10-24 NOTE — Consult Note (Signed)
Summary: [facility]   Imported By: Samara Snide 04/15/2010 12:30:47  _____________________________________________________________________  External Attachment:    Type:   Image     Comment:   External Document

## 2010-10-24 NOTE — Progress Notes (Signed)
Summary: meds prob  Phone Note Call from Patient Call back at Home Phone [phone]   Caller: Patient Summary of Call: Dr supposed to call in rx for his fluid pills and they are not at the pharm yet - [place] Initial call taken by: Audie Clear,  October 28, 2009 3:44 PM  Follow-up for Phone Call        to pcp Follow-up by: Elige Radon RN,  October 28, 2009 3:50 PM  Additional Follow-up for Phone Call Additional follow up Details #1::        Re-sent to pharmacy.  Additional Follow-up by: Mariana Arn  MD,  October 28, 2009 6:00 pM

## 2010-10-24 NOTE — Assessment & Plan Note (Signed)
Summary: f/u  kh   Vital Signs:  Patient profile:   55 year old male Height:      70 inches Weight:      312 pounds BMI:     44.93 Temp:     98.3 degrees F oral Pulse rate:   93 / minute BP sitting:   156 / 78  (left arm) Cuff size:   large  Vitals Entered By: Levert Feinstein LPN (November 21, 624THL 1:59 PM) CC: f/u chf Is Patient Diabetic? Yes Did you bring your meter with you today? Yes Pain Assessment Patient in pain? no        Primary Care Provider:  Mariana Arn  MD  CC:  f/u chf.  History of Present Illness: 1) CHF: Weight down 10 lbs since seen for exacerbation at last visit on 11/16. Reports that LE edema has improved somewhat and that dyspnea has improved, though still has baseline orthopnea. Started on torsemide 100 mg two times a day and HCTZ 25 mg daily at last visit. Also on beta-blocker, hydralazine, ACE-I - had been on isosorbide dinitrate as well but self-discontinued as he thought it might be causing constipation.   2) Sleep apnea: Started on CPAP since last visit. Reports improved sleep with fewer episodes of nighttime waking. History of HTN as well.   3) HTN: Elevated BP today. CCB, BBlocker, ACE-I, HCTZ, torsemide, hydralazine. Does not monitor salt intake or follow DASH diet (though we have discussed this many times). LE edema as above. Denies chest pain, focal neurological signs.       Habits & Providers  Alcohol-Tobacco-Diet     Tobacco Status: quit     Year Quit: 25 yr  Current Medications (verified): 1)  Plavix 75 Mg Tabs (Clopidogrel Bisulfate) .... One Tablet By Mouth Dialy 2)  Torsemide 100 Mg Tabs (Torsemide) .... One Tabs Each Morning By Mouth Twice A Day 3)  Crestor 20 Mg Tabs (Rosuvastatin Calcium) .... One Tablet By Mouth Daily 4)  Coreg 25 Mg Tabs (Carvedilol) .... One Tab By Mouth Two Times A Day 5)  Lantus 100 Unit/ml Soln (Insulin Glargine) .... 50 Units Twice Daily.  Then Increase By 1 Unit in The Am and Pm If Morning Cbgs Is Still  Higher Than 100.  Dispense 1 Month Supply Please 6)  Bd Insulin Syringe Ultrafine 30g X 1/2" 1 Ml Misc (Insulin Syringe-Needle U-100) .... Dispense One Month Supply For Once Daily Dosing.  Dispense 1 Box. 7)  Isosorbide Dinitrate 20 Mg Tabs (Isosorbide Dinitrate) .... One Tab By Mouth Three Times A Day 8)  Hydralazine Hcl 25 Mg Tabs (Hydralazine Hcl) .... One Tab By Mouth Three Times A Day 9)  Gabapentin 300 Mg Caps (Gabapentin) .... One Tab By Mouth Three Times A Day 10)  Amlodipine Besylate 10 Mg Tabs (Amlodipine Besylate) .... One Tab By Mouth Qday 11)  Cpap 15 Cwp Ahi 0 Per Hour. .... Medium Resmed Mirage Quattro Full Face Mask W/ Heated Humidifier. Wear At Night Every Night 12)  Lisinopril 40 Mg Tabs (Lisinopril) .... One Tab By Mouth Qday 13)  Glipizide 5 Mg Tabs (Glipizide) .... One Tab By Mouth Two Times A Day 14)  Hydrochlorothiazide 25 Mg Tabs (Hydrochlorothiazide) .... One Tab By Mouth Qday  Allergies (verified): No Known Drug Allergies  Physical Exam  General:  obese, NAD, vitals reviewed.   Mouth:  Oral mucosa moist, fair dentition. Neck:  + JVD  Lungs:  CTAB throughout all lung fields  Heart:  Distant heart  sounds but rormal rate and regular rhythm. S1 and S2 normal without gallop, murmur, click, rub or other extra sounds. Abdomen:  obese, somewhat distended, no tenderness.  No fluid wave palpated.  No masses.  Good bowel sounds.   Pulses:  2+ radials  Extremities:  2+ pitting edema  Neurologic:  alert & oriented X3.     Impression & Recommendations:  Problem # 1:  CHRONIC SYSTOLIC HEART FAILURE (123456) Assessment Deteriorated  Exacerbation improving slowly with 10 lbs weight off since last visit. Continue with torsemide / HCTZ. Will check renal function today given history of CRI. Follow up in two weeks. Continue BBlocker, ACE-I, hydralazine. Restart isosorbide dinitrate.   His updated medication list for this problem includes:    Plavix 75 Mg Tabs (Clopidogrel  bisulfate) ..... One tablet by mouth dialy    Torsemide 100 Mg Tabs (Torsemide) ..... One tabs each morning by mouth twice a day    Coreg 25 Mg Tabs (Carvedilol) ..... One tab by mouth two times a day    Lisinopril 40 Mg Tabs (Lisinopril) ..... One tab by mouth qday    Hydrochlorothiazide 25 Mg Tabs (Hydrochlorothiazide) ..... One tab by mouth qday  Orders: [place]- Est  Level 4 [id number])  Problem # 2:  OBSTRUCTIVE SLEEP APNEA (ICD-327.23)  Stable. Continue CPAP. Likely element of obesity hypoventilation syndrome as well.   Orders: [place]- Est  Level 4 [id number])  Problem # 3:  ESSENTIAL HYPERTENSION (ICD-401.9)  Not at goal. Difficult to control due to medication and dietary and activity recommendation non-adherence. Readdressed need for DASH diet, exercise. Will restart isosorbide dinitrate today. Remainder of medications as below. Will follow renal function with attempts to diurese. Follow up in two weeks.   His updated medication list for this problem includes:    Torsemide 100 Mg Tabs (Torsemide) ..... One tabs each morning by mouth twice a day    Coreg 25 Mg Tabs (Carvedilol) ..... One tab by mouth two times a day    Hydralazine Hcl 25 Mg Tabs (Hydralazine hcl) ..... One tab by mouth three times a day    Amlodipine Besylate 10 Mg Tabs (Amlodipine besylate) ..... One tab by mouth qday    Lisinopril 40 Mg Tabs (Lisinopril) ..... One tab by mouth qday    Hydrochlorothiazide 25 Mg Tabs (Hydrochlorothiazide) ..... One tab by mouth qday  BP today: 156/78 Prior BP: 154/86 (08/09/2010)  Labs Reviewed: K+: 3.8 (08/09/2010) Creat: : 2.07 (08/09/2010)   Chol: 193 (10/31/2009)   HDL: 46 (10/31/2009)   LDL: 89 (10/31/2009)   TG: 289 (10/31/2009)  Orders: [place]- Est  Level 4 ([address])  Complete Medication List: 1)  Plavix 75 Mg Tabs (Clopidogrel bisulfate) .... One tablet by mouth dialy 2)  Torsemide 100 Mg Tabs (Torsemide) .... One tabs each morning by mouth twice a day 3)  Crestor 20 Mg Tabs  (Rosuvastatin calcium) .... One tablet by mouth daily 4)  Coreg 25 Mg Tabs (Carvedilol) .... One tab by mouth two times a day 5)  Lantus 100 Unit/ml Soln (Insulin glargine) .... 50 units twice daily.  then increase by 1 unit in the am and pm if morning cbgs is still higher than 100.  dispense 1 month supply please 6)  Bd Insulin Syringe Ultrafine 30g X 1/2" 1 Ml Misc (Insulin syringe-needle u-100) .... Dispense one month supply for once daily dosing.  dispense 1 box. 7)  Isosorbide Dinitrate 20 Mg Tabs (Isosorbide dinitrate) .... One tab by mouth three times a day 8)  Hydralazine Hcl 25 Mg Tabs (Hydralazine hcl) .... One tab by mouth three times a day 9)  Gabapentin 300 Mg Caps (Gabapentin) .... One tab by mouth three times a day 10)  Amlodipine Besylate 10 Mg Tabs (Amlodipine besylate) .... One tab by mouth qday 11)  Cpap 15 Cwp Ahi 0 Per Hour.  .... Medium resmed mirage quattro full face mask w/ heated humidifier. wear at night every night 12)  Lisinopril 40 Mg Tabs (Lisinopril) .... One tab by mouth qday 13)  Glipizide 5 Mg Tabs (Glipizide) .... One tab by mouth two times a day 14)  Hydrochlorothiazide 25 Mg Tabs (Hydrochlorothiazide) .... One tab by mouth qday  Other Orders: [facility] [id number])  Patient Instructions: 1)  It was great to see you today!  2)  Restart your isosorbide dinitrate as directed. 3)  Continue the hydrochlorothiazide 25 mg daily (HCTZ) 4)  Follow up with me in two weeks.  5)  STOP ALL SODAS - YOUR ONLY DRINK SHOULD BE WATER  6)  DO NOT COOK WITH SALT.  Prescriptions: ISOSORBIDE DINITRATE 20 MG TABS (ISOSORBIDE DINITRATE) one tab by mouth three times a day  #90 x 3   Entered and Authorized by:   Mariana Arn  MD   Signed by:   Mariana Arn  MD on 08/14/2010   Method used:   Electronically to        [facility] (retail)       [address]       [place], [place]  [address]       Ph: [phone]       Fax: [phone]    RxID:   [id number] HYDROCHLOROTHIAZIDE 25 MG TABS (HYDROCHLOROTHIAZIDE) one tab by mouth qday  #30 x 3   Entered and Authorized by:   Mariana Arn  MD   Signed by:   Mariana Arn  MD on 08/14/2010   Method used:   Electronically to        [facility] (retail)       [address]       [place]   [address]       Ph: [phone]       Fax: [phone]   [id number] GLIPIZIDE 5 MG TABS (GLIPIZIDE) one tab by mouth two times a day  #60 x 3   Entered and Authorized by:   Mariana Arn  MD   Signed by:   Mariana Arn  MD on 08/14/2010   Method used:   Electronically to        [facility] (retail)       [place], [place]  [address]       Ph: [phone]       Fax: [phone]   RxIDID:[id number] LISINOPRIL 40 MG TABS (LISINOPRIL) one tab by mouth qday  #30 x 3   Entered and Authorized by:   Mariana Arn  MD   Signed by:   Mariana Arn  MD on 08/14/2010   Method used:   Electronically to        [facility] (retail)       [place], [place]  [id number]       Ph: [phone]       Fax: [phone]   RxID:   [id number]    Orders Added: 1)  [facility] [id number] 2)  [facility]- Est  Level 4 [id number]

## 2010-10-24 NOTE — Consult Note (Signed)
Summary: [facility]   Imported By: Raymond Gurney 03/22/2010 15:06:06  _____________________________________________________________________  External Attachment:    Type:   Image     Comment:   External Document

## 2010-10-24 NOTE — Progress Notes (Signed)
  Phone Note Call from Patient   Caller: Patient Summary of Call: Pt requesting torsemide.  States he has been to [facility] and no prescription available.  I have  called Dr. Sherilyn Cooter and he is planning on sending the prescription now. Initial call taken by: Dion Body  MD,  October 28, 2009 5:51 PM

## 2010-10-26 NOTE — Consult Note (Signed)
Summary: [facility]   Imported By: Samara Snide 10/19/2010 08:53:08  _____________________________________________________________________  External Attachment:    Type:   Image     Comment:   External Document

## 2010-10-27 NOTE — Miscellaneous (Signed)
Summary: CPAP set-up/[facility]  CPAP set-up/[facility]   Imported By: Bubba Hales 08/23/2010 10:56:22  _____________________________________________________________________  External Attachment:    Type:   Image     Comment:   External Document

## 2010-11-08 ENCOUNTER — Encounter: Payer: Self-pay | Admitting: Family Medicine

## 2010-11-08 ENCOUNTER — Ambulatory Visit (HOSPITAL_COMMUNITY)
Admission: RE | Admit: 2010-11-08 | Discharge: 2010-11-08 | Disposition: A | Discharge status: Home / Self Care | Payer: Medicaid Other | Source: Ambulatory Visit | Attending: Cardiology | Admitting: Cardiology

## 2010-11-08 ENCOUNTER — Ambulatory Visit
Admission: RE | Admit: 2010-11-08 | Discharge: 2010-11-08 | Disposition: A | Discharge status: Home / Self Care | Payer: Medicaid Other | Source: Ambulatory Visit | Attending: Family Medicine | Admitting: Family Medicine

## 2010-11-08 ENCOUNTER — Ambulatory Visit (INDEPENDENT_AMBULATORY_CARE_PROVIDER_SITE_OTHER): Payer: Medicaid Other | Admitting: Family Medicine

## 2010-11-08 DIAGNOSIS — R079 Chest pain, unspecified: Secondary | ICD-10-CM | POA: Insufficient documentation

## 2010-11-08 DIAGNOSIS — R05 Cough: Secondary | ICD-10-CM

## 2010-11-08 DIAGNOSIS — R053 Chronic cough: Secondary | ICD-10-CM | POA: Insufficient documentation

## 2010-11-08 DIAGNOSIS — Z8679 Personal history of other diseases of the circulatory system: Secondary | ICD-10-CM

## 2010-11-08 DIAGNOSIS — N289 Disorder of kidney and ureter, unspecified: Secondary | ICD-10-CM

## 2010-11-08 LAB — BASIC METABOLIC PANEL
BUN: 78 mg/dL — ABNORMAL HIGH (ref 6–23)
Chloride: 104 mEq/L (ref 96–112)
Creat: 5.23 mg/dL — ABNORMAL HIGH (ref 0.40–1.50)
Glucose, Bld: 243 mg/dL — ABNORMAL HIGH (ref 70–99)
Potassium: 4.7 mEq/L (ref 3.5–5.3)

## 2010-11-08 LAB — CBC WITH DIFFERENTIAL/PLATELET
Basophils Absolute: 0.1 10*3/uL (ref 0.0–0.1)
Basophils Relative: 1 % (ref 0–1)
Eosinophils Absolute: 0.4 10*3/uL (ref 0.0–0.7)
Eosinophils Relative: 7 % — ABNORMAL HIGH (ref 0–5)
MCH: 27.7 pg (ref 26.0–34.0)
MCHC: 31.7 g/dL (ref 30.0–36.0)
MCV: 87.4 fL (ref 78.0–100.0)
Neutrophils Relative %: 46 % (ref 43–77)
Platelets: 279 10*3/uL (ref 150–400)
RDW: 13.6 % (ref 11.5–15.5)

## 2010-11-08 MED ORDER — NITROGLYCERIN 0.4 MG SL SUBL: 0.4000 mg | SUBLINGUAL_TABLET | SUBLINGUAL | Status: AC | PRN

## 2010-11-08 NOTE — Assessment & Plan Note (Signed)
Will recheck BMP today along with BNP. Patient has upcoming appointment with Dr. Jimmy Footman.

## 2010-11-08 NOTE — Assessment & Plan Note (Signed)
Hx of stent x 2 with suspected occlusion previously, but catheterization delayed 2/2 renal function. EKG unchanged in office today. Rx Nitro. Advised patient to f/u with cardiology for further intervention vs medical management.

## 2010-11-08 NOTE — Progress Notes (Signed)
  Subjective:    Patient ID: Jonathon Bailey, male    DOB: [birth year], 55 y.o.   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 The cough is dry and sometimes associated with anterior chest pain. He denies fever/chills, HA, dizziness, N/V/D/C, increased LE edema, rash, sick contacts. The chest pain happens at rest, but he does cite one episode of CP with exertion that was associated with nausea and diaphoresis. He has a cardiologist, Jonathon Bailey - reviewed records. Cardiac catheterization has been deferred 2/2 elevated creatinine. "Dr. Jimmy Footman said that I can have the surgery and he will take care of my kidneys." He did see Dr. Jimmy Footman last month and was prescribed Azithromycin for he cough with no relief per patient.   Review of Systems SEE HPI.     Objective:   Physical Exam  Constitutional: He is oriented to person, place, and time. Vital signs are normal. He appears well-developed and well-nourished. He is cooperative.  HENT:  Head: Normocephalic and atraumatic.  Right Ear: Tympanic membrane normal.  Left Ear: Tympanic membrane normal.  Nose: Rhinorrhea present.  Mouth/Throat: Oropharynx is clear and moist and mucous membranes are normal.  Neck: No JVD present.  Cardiovascular: S1 normal, S2 normal and normal pulses.   Murmur heard.  Systolic murmur is present with a grade of 2/6       Trace LE edema bilaterally.  Pulmonary/Chest: Effort normal. He has decreased breath sounds. He has no wheezes.       Some TTP along left costochondral junction.  Abdominal: Soft. Bowel sounds are normal.  Neurological: He is alert and oriented to person, place, and time.  Psychiatric: He has a normal mood and affect. His speech is delayed.          Assessment & Plan:

## 2010-11-08 NOTE — Assessment & Plan Note (Signed)
Hx of CVA, now with emotional incontinence. Will speak to patient's PCP re: future treatment (therapy vs medication vs both) as patient is requesting help with this.

## 2010-11-08 NOTE — Patient Instructions (Signed)
It was nice to meet you today!  We are getting a chest xray to look for infection and fluid in your lungs.  Please make an appointment with your cardiologist to look into having surgery.  Please continue to take your medications.  Go to the Emergency Department if you have chest pain, shortness of breath, or any other concerning symptoms.

## 2010-11-08 NOTE — Assessment & Plan Note (Signed)
Patient c/o emotional incontinence. Will leave future treatment to PCP (medications vs therapy vs both). Patient does request help with this issue.

## 2010-11-08 NOTE — Assessment & Plan Note (Signed)
No Red Flags today. Cough DDx includes bronchospasm from previous bronchitis, CHF (though weight down today 310 to 303), allergic. Will obtain CXR and BNP.

## 2010-11-10 ENCOUNTER — Telehealth: Payer: Self-pay | Admitting: *Deleted

## 2010-11-10 NOTE — Telephone Encounter (Signed)
attempted to call patient, no answer or voicemail. Faxed lab results to renal MD. He needs an appointment with Dr Juleen China or renal doctor within the next couple of weeks. Patient also needs to cut torsemide to 1/2 tab twice a day until he see's wallace or renal doctor.

## 2010-11-10 NOTE — Telephone Encounter (Signed)
Message copied by Schuyler Amor on Fri Nov 10, 2010  4:44 PM ------      Message from: Briscoe Deutscher      Created: Thu Nov 09, 2010 10:09 AM       Patient with acute on chronic renal failure. Monitored by Renal. On a few nephrotoxic meds. Please fax results to RENAL ASAP. Ask patient to decrease Torsemide to 1/2 tab po BID for next few days, and to monitor weight.

## 2010-11-13 NOTE — Telephone Encounter (Signed)
Called and spoke with patient, informed him to start 1/2 tab of torsemide bid and to monitor weight. Also he needs to schedule appointment with Juleen China or renal MD within the next couple of weeks.

## 2010-11-20 ENCOUNTER — Ambulatory Visit: Payer: Medicaid Other | Admitting: Family Medicine

## 2010-12-11 LAB — RENAL FUNCTION PANEL
Albumin: 3.1 g/dL — ABNORMAL LOW (ref 3.5–5.2)
BUN: 31 mg/dL — ABNORMAL HIGH (ref 6–23)
BUN: 39 mg/dL — ABNORMAL HIGH (ref 6–23)
BUN: 40 mg/dL — ABNORMAL HIGH (ref 6–23)
CO2: 26 mEq/L (ref 19–32)
Calcium: 8.9 mg/dL (ref 8.4–10.5)
Calcium: 9.1 mg/dL (ref 8.4–10.5)
Calcium: 9.3 mg/dL (ref 8.4–10.5)
Creatinine, Ser: 2.14 mg/dL — ABNORMAL HIGH (ref 0.4–1.5)
Creatinine, Ser: 2.41 mg/dL — ABNORMAL HIGH (ref 0.4–1.5)
GFR calc Af Amer: 34 mL/min — ABNORMAL LOW (ref >60)
GFR calc Af Amer: 39 mL/min — ABNORMAL LOW (ref >60)
GFR calc non Af Amer: 28 mL/min — ABNORMAL LOW (ref >60)
GFR calc non Af Amer: 33 mL/min — ABNORMAL LOW (ref >60)
Glucose, Bld: 130 mg/dL — ABNORMAL HIGH (ref 70–99)
Phosphorus: 4.6 mg/dL (ref 2.3–4.6)
Phosphorus: 5.4 mg/dL — ABNORMAL HIGH (ref 2.3–4.6)
Potassium: 4.7 mEq/L (ref 3.5–5.1)

## 2010-12-11 LAB — CBC
HCT: 27.1 % — ABNORMAL LOW (ref 39.0–52.0)
HCT: 27.7 % — ABNORMAL LOW (ref 39.0–52.0)
HCT: 28.4 % — ABNORMAL LOW (ref 39.0–52.0)
Hemoglobin: 9.2 g/dL — ABNORMAL LOW (ref 13.0–17.0)
Hemoglobin: 9.4 g/dL — ABNORMAL LOW (ref 13.0–17.0)
Hemoglobin: 9.6 g/dL — ABNORMAL LOW (ref 13.0–17.0)
Hemoglobin: 9.7 g/dL — ABNORMAL LOW (ref 13.0–17.0)
MCHC: 33.3 g/dL (ref 30.0–36.0)
MCHC: 33.4 g/dL (ref 30.0–36.0)
MCHC: 34 g/dL (ref 30.0–36.0)
MCHC: 34 g/dL (ref 30.0–36.0)
MCV: 87.9 fL (ref 78.0–100.0)
MCV: 88.8 fL (ref 78.0–100.0)
MCV: 89.6 fL (ref 78.0–100.0)
Platelets: 198 10*3/uL (ref 150–400)
Platelets: 201 10*3/uL (ref 150–400)
Platelets: 202 10*3/uL (ref 150–400)
Platelets: 207 10*3/uL (ref 150–400)
Platelets: 251 10*3/uL (ref 150–400)
RBC: 3.02 MIL/uL — ABNORMAL LOW (ref 4.22–5.81)
RBC: 3.18 MIL/uL — ABNORMAL LOW (ref 4.22–5.81)
RBC: 3.57 MIL/uL — ABNORMAL LOW (ref 4.22–5.81)
RDW: 15.1 % (ref 11.5–15.5)
RDW: 15.4 % (ref 11.5–15.5)
RDW: 15.7 % — ABNORMAL HIGH (ref 11.5–15.5)
RDW: 15.9 % — ABNORMAL HIGH (ref 11.5–15.5)
RDW: 15.9 % — ABNORMAL HIGH (ref 11.5–15.5)
WBC: 6 10*3/uL (ref 4.0–10.5)
WBC: 6.7 10*3/uL (ref 4.0–10.5)
WBC: 9.1 10*3/uL (ref 4.0–10.5)

## 2010-12-11 LAB — GLUCOSE, CAPILLARY
Glucose-Capillary: 101 mg/dL — ABNORMAL HIGH (ref 70–99)
Glucose-Capillary: 103 mg/dL — ABNORMAL HIGH (ref 70–99)
Glucose-Capillary: 121 mg/dL — ABNORMAL HIGH (ref 70–99)
Glucose-Capillary: 137 mg/dL — ABNORMAL HIGH (ref 70–99)
Glucose-Capillary: 138 mg/dL — ABNORMAL HIGH (ref 70–99)
Glucose-Capillary: 143 mg/dL — ABNORMAL HIGH (ref 70–99)
Glucose-Capillary: 147 mg/dL — ABNORMAL HIGH (ref 70–99)
Glucose-Capillary: 149 mg/dL — ABNORMAL HIGH (ref 70–99)
Glucose-Capillary: 150 mg/dL — ABNORMAL HIGH (ref 70–99)
Glucose-Capillary: 155 mg/dL — ABNORMAL HIGH (ref 70–99)
Glucose-Capillary: 156 mg/dL — ABNORMAL HIGH (ref 70–99)
Glucose-Capillary: 157 mg/dL — ABNORMAL HIGH (ref 70–99)
Glucose-Capillary: 158 mg/dL — ABNORMAL HIGH (ref 70–99)
Glucose-Capillary: 173 mg/dL — ABNORMAL HIGH (ref 70–99)
Glucose-Capillary: 188 mg/dL — ABNORMAL HIGH (ref 70–99)
Glucose-Capillary: 226 mg/dL — ABNORMAL HIGH (ref 70–99)
Glucose-Capillary: 89 mg/dL (ref 70–99)
Glucose-Capillary: 94 mg/dL (ref 70–99)

## 2010-12-11 LAB — BASIC METABOLIC PANEL
BUN: 26 mg/dL — ABNORMAL HIGH (ref 6–23)
BUN: 28 mg/dL — ABNORMAL HIGH (ref 6–23)
CO2: 24 mEq/L (ref 19–32)
CO2: 25 mEq/L (ref 19–32)
CO2: 28 mEq/L (ref 19–32)
Calcium: 8.8 mg/dL (ref 8.4–10.5)
Calcium: 9.4 mg/dL (ref 8.4–10.5)
Calcium: 9.6 mg/dL (ref 8.4–10.5)
Calcium: 9.7 mg/dL (ref 8.4–10.5)
Chloride: 110 mEq/L (ref 96–112)
Creatinine, Ser: 1.98 mg/dL — ABNORMAL HIGH (ref 0.4–1.5)
Creatinine, Ser: 2.01 mg/dL — ABNORMAL HIGH (ref 0.4–1.5)
GFR calc Af Amer: 40 mL/min — ABNORMAL LOW (ref >60)
GFR calc Af Amer: 42 mL/min — ABNORMAL LOW (ref >60)
GFR calc non Af Amer: 25 mL/min — ABNORMAL LOW (ref >60)
GFR calc non Af Amer: 25 mL/min — ABNORMAL LOW (ref >60)
GFR calc non Af Amer: 33 mL/min — ABNORMAL LOW (ref >60)
GFR calc non Af Amer: 36 mL/min — ABNORMAL LOW (ref >60)
Glucose, Bld: 105 mg/dL — ABNORMAL HIGH (ref 70–99)
Glucose, Bld: 121 mg/dL — ABNORMAL HIGH (ref 70–99)
Glucose, Bld: 123 mg/dL — ABNORMAL HIGH (ref 70–99)
Glucose, Bld: 57 mg/dL — ABNORMAL LOW (ref 70–99)
Potassium: 4.2 mEq/L (ref 3.5–5.1)
Potassium: 5.2 mEq/L — ABNORMAL HIGH (ref 3.5–5.1)
Sodium: 135 mEq/L (ref 135–145)
Sodium: 137 mEq/L (ref 135–145)
Sodium: 138 mEq/L (ref 135–145)
Sodium: 139 mEq/L (ref 135–145)

## 2010-12-11 LAB — PHOSPHORUS: Phosphorus: 4.4 mg/dL (ref 2.3–4.6)

## 2010-12-11 LAB — CK TOTAL AND CKMB (NOT AT ARMC): Relative Index: 1.5 (ref 0.0–2.5)

## 2010-12-11 LAB — VITAMIN B12: Vitamin B-12: 269 pg/mL (ref 211–911)

## 2010-12-11 LAB — URINALYSIS, MICROSCOPIC ONLY
Bilirubin Urine: NEGATIVE
Glucose, UA: NEGATIVE mg/dL
Hgb urine dipstick: NEGATIVE
Ketones, ur: NEGATIVE mg/dL
Ketones, ur: NEGATIVE mg/dL
Leukocytes, UA: NEGATIVE
Nitrite: NEGATIVE
Specific Gravity, Urine: 1.012 (ref 1.005–1.030)
Specific Gravity, Urine: 1.018 (ref 1.005–1.030)
Urobilinogen, UA: 1 mg/dL (ref 0.0–1.0)
pH: 5.5 (ref 5.0–8.0)
pH: 6 (ref 5.0–8.0)

## 2010-12-11 LAB — PROTEIN / CREATININE RATIO, URINE
Creatinine, Urine: 113.4 mg/dL
Protein Creatinine Ratio: 1.3 — ABNORMAL HIGH (ref 0.00–0.15)
Total Protein, Urine: 147 mg/dL

## 2010-12-11 LAB — PROTEIN ELECTROPH W RFLX QUANT IMMUNOGLOBULINS
Albumin ELP: 52.4 % — ABNORMAL LOW (ref 55.8–66.1)
Alpha-1-Globulin: 4.2 % (ref 2.9–4.9)
Alpha-2-Globulin: 10.4 % (ref 7.1–11.8)
Beta 2: 8.1 % — ABNORMAL HIGH (ref 3.2–6.5)
Beta Globulin: 5.9 % (ref 4.7–7.2)

## 2010-12-11 LAB — URINE CULTURE: Colony Count: 35000

## 2010-12-11 LAB — FOLATE: Folate: 8 ng/mL

## 2010-12-11 LAB — COMPREHENSIVE METABOLIC PANEL
ALT: 19 U/L (ref 0–53)
Albumin: 3.2 g/dL — ABNORMAL LOW (ref 3.5–5.2)
Alkaline Phosphatase: 52 U/L (ref 39–117)
Potassium: 4.1 mEq/L (ref 3.5–5.1)
Sodium: 137 mEq/L (ref 135–145)
Total Protein: 6.7 g/dL (ref 6.0–8.3)

## 2010-12-11 LAB — UIFE/LIGHT CHAINS/TP QN, 24-HR UR
Albumin, U: DETECTED
Alpha 2, Urine: DETECTED — AB
Beta, Urine: DETECTED — AB
Total Protein, Urine: 114.7 mg/dL

## 2010-12-11 LAB — DIFFERENTIAL
Basophils Absolute: 0.1 10*3/uL (ref 0.0–0.1)
Basophils Relative: 1 % (ref 0–1)
Eosinophils Absolute: 0.4 10*3/uL (ref 0.0–0.7)
Monocytes Relative: 10 % (ref 3–12)
Neutrophils Relative %: 49 % (ref 43–77)

## 2010-12-11 LAB — HEMOCCULT GUIAC POC 1CARD (OFFICE)
Fecal Occult Bld: NEGATIVE
Fecal Occult Bld: NEGATIVE

## 2010-12-11 LAB — APTT: aPTT: 29 seconds (ref 24–37)

## 2010-12-11 LAB — POCT I-STAT, CHEM 8
Glucose, Bld: 71 mg/dL (ref 70–99)
HCT: 33 % — ABNORMAL LOW (ref 39.0–52.0)
Hemoglobin: 11.2 g/dL — ABNORMAL LOW (ref 13.0–17.0)
Potassium: 4.5 mEq/L (ref 3.5–5.1)

## 2010-12-11 LAB — IRON AND TIBC
TIBC: 264 ug/dL (ref 215–435)
UIBC: 220 ug/dL

## 2010-12-11 LAB — POCT CARDIAC MARKERS: CKMB, poc: 6.8 ng/mL (ref 1.0–8.0)

## 2010-12-11 LAB — PROTIME-INR: Prothrombin Time: 12.7 seconds (ref 11.6–15.2)

## 2010-12-11 LAB — FERRITIN: Ferritin: 471 ng/mL — ABNORMAL HIGH (ref 22–322)

## 2010-12-11 LAB — TROPONIN I: Troponin I: 0.05 ng/mL (ref 0.00–0.06)

## 2010-12-11 LAB — CARDIAC PANEL(CRET KIN+CKTOT+MB+TROPI): Relative Index: 1.3 (ref 0.0–2.5)

## 2010-12-12 LAB — URINALYSIS, ROUTINE W REFLEX MICROSCOPIC
Glucose, UA: NEGATIVE mg/dL
Ketones, ur: NEGATIVE mg/dL
Leukocytes, UA: NEGATIVE
pH: 6.5 (ref 5.0–8.0)

## 2010-12-12 LAB — URINE CULTURE: Colony Count: 7000

## 2010-12-12 LAB — DIFFERENTIAL
Basophils Absolute: 0 10*3/uL (ref 0.0–0.1)
Basophils Relative: 1 % (ref 0–1)
Eosinophils Absolute: 0.4 10*3/uL (ref 0.0–0.7)
Eosinophils Relative: 5 % (ref 0–5)

## 2010-12-12 LAB — CBC
HCT: 34.4 % — ABNORMAL LOW (ref 39.0–52.0)
MCHC: 34.5 g/dL (ref 30.0–36.0)
MCV: 84.9 fL (ref 78.0–100.0)
RBC: 4.05 MIL/uL — ABNORMAL LOW (ref 4.22–5.81)
WBC: 8.3 10*3/uL (ref 4.0–10.5)

## 2010-12-12 LAB — URINE MICROSCOPIC-ADD ON

## 2010-12-12 LAB — COMPREHENSIVE METABOLIC PANEL
ALT: 20 U/L (ref 0–53)
AST: 19 U/L (ref 0–37)
Alkaline Phosphatase: 49 U/L (ref 39–117)
CO2: 25 mEq/L (ref 19–32)
Chloride: 110 mEq/L (ref 96–112)
GFR calc Af Amer: 59 mL/min — ABNORMAL LOW (ref >60)
GFR calc non Af Amer: 49 mL/min — ABNORMAL LOW (ref >60)
Sodium: 138 mEq/L (ref 135–145)
Total Bilirubin: 0.6 mg/dL (ref 0.3–1.2)

## 2010-12-31 LAB — BASIC METABOLIC PANEL
BUN: 14 mg/dL (ref 6–23)
CO2: 30 mEq/L (ref 19–32)
CO2: 30 mEq/L (ref 19–32)
CO2: 31 mEq/L (ref 19–32)
Chloride: 99 mEq/L (ref 96–112)
Chloride: 99 mEq/L (ref 96–112)
Creatinine, Ser: 1.5 mg/dL (ref 0.4–1.5)
GFR calc Af Amer: >60 mL/min (ref >60)
GFR calc non Af Amer: 52 mL/min — ABNORMAL LOW (ref >60)
Glucose, Bld: 231 mg/dL — ABNORMAL HIGH (ref 70–99)
Potassium: 3.3 mEq/L — ABNORMAL LOW (ref 3.5–5.1)
Potassium: 3.9 mEq/L (ref 3.5–5.1)
Sodium: 136 mEq/L (ref 135–145)
Sodium: 137 mEq/L (ref 135–145)

## 2010-12-31 LAB — GLUCOSE, CAPILLARY
Glucose-Capillary: 207 mg/dL — ABNORMAL HIGH (ref 70–99)
Glucose-Capillary: 218 mg/dL — ABNORMAL HIGH (ref 70–99)
Glucose-Capillary: 240 mg/dL — ABNORMAL HIGH (ref 70–99)
Glucose-Capillary: 242 mg/dL — ABNORMAL HIGH (ref 70–99)
Glucose-Capillary: 276 mg/dL — ABNORMAL HIGH (ref 70–99)
Glucose-Capillary: 286 mg/dL — ABNORMAL HIGH (ref 70–99)

## 2010-12-31 LAB — CBC
HCT: 42.7 % (ref 39.0–52.0)
Hemoglobin: 14.3 g/dL (ref 13.0–17.0)
MCHC: 33.6 g/dL (ref 30.0–36.0)
RBC: 4.8 MIL/uL (ref 4.22–5.81)
RDW: 12.9 % (ref 11.5–15.5)

## 2010-12-31 LAB — RAPID URINE DRUG SCREEN, HOSP PERFORMED
Amphetamines: NOT DETECTED
Cocaine: NOT DETECTED
Opiates: POSITIVE — AB
Tetrahydrocannabinol: NOT DETECTED

## 2010-12-31 LAB — POCT I-STAT, CHEM 8
Calcium, Ion: 1.3 mmol/L (ref 1.12–1.32)
Chloride: 104 mEq/L (ref 96–112)
HCT: 45 % (ref 39.0–52.0)

## 2010-12-31 LAB — LIPID PANEL
HDL: 46 mg/dL (ref >39)
Total CHOL/HDL Ratio: 8.4 RATIO

## 2010-12-31 LAB — TSH: TSH: 1.308 u[IU]/mL (ref 0.350–4.500)

## 2010-12-31 LAB — SEDIMENTATION RATE: Sed Rate: 47 mm/hr — ABNORMAL HIGH (ref 0–16)

## 2010-12-31 LAB — HEMOGLOBIN A1C: Mean Plasma Glucose: 235 mg/dL

## 2011-01-12 ENCOUNTER — Telehealth: Payer: Self-pay | Admitting: Family Medicine

## 2011-01-12 MED ORDER — INSULIN GLARGINE 100 UNIT/ML ~~LOC~~ SOLN: 70.0000 [IU] | Freq: Two times a day (BID) | SUBCUTANEOUS | Status: DC

## 2011-01-12 MED ORDER — INSULIN GLARGINE 100 UNIT/ML ~~LOC~~ SOLN: SUBCUTANEOUS | Status: DC

## 2011-01-12 MED ORDER — TORSEMIDE 100 MG PO TABS: 100.0000 mg | ORAL_TABLET | Freq: Two times a day (BID) | ORAL | Status: DC

## 2011-01-12 MED ORDER — LISINOPRIL 40 MG PO TABS: 40.0000 mg | ORAL_TABLET | Freq: Every day | ORAL | Status: DC

## 2011-01-12 NOTE — Telephone Encounter (Signed)
Called patient to let him know he can pick up refills today here in clinic.

## 2011-01-12 NOTE — Telephone Encounter (Signed)
pts asking for new rxs for torsemide, insulin & lisinopril so he can take it to [facility] on Tuesday.

## 2011-01-12 NOTE — Telephone Encounter (Signed)
Addended byMariana Arn on: 01/12/2011 02:37 PM   Modules accepted: Orders

## 2011-01-16 ENCOUNTER — Telehealth: Payer: Self-pay | Admitting: Family Medicine

## 2011-01-16 NOTE — Telephone Encounter (Signed)
Needs to verify lantus insulin, rx says to fill for 1 month, the  Qty written was 10 mls, needs 50 mls. Please call to verify change.

## 2011-01-18 NOTE — Telephone Encounter (Signed)
Taken care of.  Automated dose not taken out of Rx.  Told her to use the other amount which equals up with his sig. Ragina Fenter, Salome Spotted

## 2011-02-06 NOTE — Op Note (Signed)
NAME:  Jonathon Bailey, Jonathon Bailey NO.:  [id number]   MEDICAL RECORD NO.:  [id number]          PATIENT TYPE:  AMB   LOCATION:  ENDO                         FACILITY:  [facility]   PHYSICIAN:  Nelwyn Salisbury, M.D.  DATE OF BIRTH:  1956-03-27   DATE OF PROCEDURE:  12/19/2007  DATE OF DISCHARGE:  12/19/2007                               OPERATIVE REPORT   PROCEDURE PERFORMED:  Screening colonoscopy.   ENDOSCOPIST:  Nelwyn Salisbury, MD   INSTRUMENT USED:  Pentax video colonoscope.   INDICATIONS FOR PROCEDURE:  57 African American male  undergoing a screening colonoscopy--the patient was found to have guaiac-  positive stools on a routine exam--to rule out colonic polyps, masses,  etc.   PREPROCEDURE PREPARATION:  Informed consent was procured from the  patient.  The patient fasted for 4 hours prior to the procedure and  prepped with MoviPrep the night of and the morning of the procedure.  Risks and benefits of the procedure including a 10% miss rate of cancer  and polyp were discussed with the patient as well.   PREPROCEDURE PHYSICAL:  VITAL SIGNS:  The patient had stable vital  signs.  NECK:  Supple.  CHEST:  Clear to auscultation.  CARDIAC:  S1 and S2 regular.  ABDOMEN:  Soft with normal bowel sounds.   DESCRIPTION OF PROCEDURE:  The patient was placed in the left lateral  decubitus position and sedated with 50 mcg of Fentanyl and 2 mg of  Versed given intravenously in slow incremental doses.  Once the patient  was adequately sedated and maintained on low-flow oxygen and continuous  cardiac monitoring, the Pentax video colonoscope was advanced from the  rectum to the cecum.  The appendiceal orifice and cecal valve were  clearly visualized and photographed.  No masses, polyps, erosions,  ulcerations or diverticula were noted.  Retroflexion in the rectum  revealed no abnormalities.  The patient tolerated the procedure well  without immediate complications.   IMPRESSION:  Normal colonoscopy up the cecum.  No masses, polyps,  erosions, ulcerations or diverticula noted.   RECOMMENDATIONS:  1. Continue a high-fiber diet with liberal fluid intake.  2. Repeat colonoscopy in the next 10 years.  If the patient has any      abnormal symptoms in interim, he should contact the office      immediately for further evaluation.  3. Outpatient followup within the next 2 weeks for repeat guaiac      testing.      Nelwyn Salisbury, M.D.  Electronically Signed     JNM/MEDQ  D:  12/25/2007  T:  12/25/2007  Job:  [id number]   cc:   Robyn Haber, M.D.  Fax: [phone]

## 2011-02-06 NOTE — Discharge Summary (Signed)
NAME:  Jonathon Bailey, Jonathon Bailey NO.:  [id number]   MEDICAL RECORD NO.:  [id number]          PATIENT TYPE:  OBV   LOCATION:  [id number]                         FACILITY:  [facility]   PHYSICIAN:  Blane Ohara McDiarmid, M.D.DATE OF BIRTH:  18-Feb-1956   DATE OF ADMISSION:  09/13/2008  DATE OF DISCHARGE:  09/14/2008                               DISCHARGE SUMMARY   PRIMARY CARE Evamaria Detore:  The patient was initially seen by Dr. Everlene Farrier, at  [facility] as his primary care physician; however, the patient  will follow up with Dr. Sherilyn Cooter at [facility].   CARDIOLOGIST:  Shelva Majestic, MD, at [facility].   RENAL DOCTOR:  Dr. Jimmy Footman at [facility].   DISCHARGE DIAGNOSES:  1. Community-acquired pneumonia.  2. Diabetes mellitus type 2.  3. Hypertension.  4. Hyperlipidemia.  5. History of obstructive sleep apnea.  6. Bilateral hilar adenopathy on chest films.  7. Diastolic dysfunction.  8. Coronary artery disease.   DISCHARGE MEDICATIONS:  1. Avelox 400 mg p.o. daily x10 days.  2. Aspirin 81 mg p.o. daily.  3. Lasix 80 mg p.o. b.i.d.  4. Metoprolol 25 mg p.o. b.i.d.  5. Zocor 80 mg p.o. nightly.  6. Metformin 500 mg p.o. b.i.d.  7. Glipizide 5 mg p.o. daily.  8. Lisinopril 10 mg p.o. daily.   CONSULTS:  None.   PROCEDURES:  1. Chest x-ray, 2-view on September 13, 2008, shows new bilateral      patchy pulmonary infiltrates with normal heart sounds and without      significant pleural fluid, favored pneumonia over atypical      pulmonary edema.  2. EKG on September 13, 2008, on admission showed normal sinus rhythm,      no acute ischemic changes, QTc of 497.  Repeat EKG on July 15, 2008, showed again normal sinus rhythm, no ischemic changes, and      QTc of 495.   LABORATORY DATA:  On discharge, hemoglobin 12.3, hematocrit 37.5, white  blood cells 8.3, platelets 258.  Electrolytes within normal limits  except for  glucose of 255, creatinine 1.32.  Cardiac troponin I negative  x3 sets, CK trended from 217 to 201 to 220, CK-MB trended from 6.3 to  5.7 to 5.8.  BNP on admission was 279.  Hemoglobin A1c 10.8.   BRIEF HOSPITAL COURSE:  This is a 55 year old male with past medical  history significant for diabetes, hypertension, hyperlipidemia,  diastolic dysfunction, who presented with cough and worsening dyspnea,  that was approximately 1 week.  1. Community-acquired pneumonia.  The patient with bilateral pulmonary      infiltrates on x-ray as shown above.  The patient with past      treatment for community-acquired pneumonia approximately 1 year ago      in January 2009.  The patient was started on Rocephin and Cipro in      the emergency room.  The patient was continued on azithromycin and      ceftriaxone over the course of his hospitalization.  The patient  will be discharged on Avelox for a total of 10-day course.  The      patient was able to tolerate on room air, well maintaining sats of      90% and the patient's complaints of cough and dyspnea had decreased      on day of discharge.  The patient was afebrile for the course of      his hospitalization.  The patient was noted to have bilateral hilar      adenopathy on chest x-ray.  2. Type 2 diabetes. The patient was admitted off of his medications      for more than 2 months.  The patient was started on sliding scale      insulin during the course of his hospitalization and required      approximately 10 units prior to discharge.  The patient's A1c was      10.8.  The patient will be discharged on metformin 500 b.i.d. as      well as glipizide as above.  The patient will follow up with his      primary care physician in the outpatient setting regarding possible      of restarting insulin.  Please note that the patient was unable to      afford his Lantus hence another insulin should be chosen if      deciding to restart insulin.  The  patient was also started on      lisinopril on discharge.  3. Hypertension.  The patient was restarted on his home dose of Lasix      during the course of his hospitalization as well as metoprolol      initially 12.5 mg b.i.d., then up to 25 mg b.i.d. with pressures to      [id number] systolic over 99991111 diastolic.  The patient's pressures had      improved to 136/96 on discharge.  The patient will be discharged on      Lasix, metoprolol, and lisinopril as above.  4. Hyperlipidemia.  Again, the patient was off of his medications for      at least 2 months prior.  The patient's LFTs were within normal      limits.  The patient was started on simvastatin 40, this was      increased to simvastatin 80 mg p.o. daily for discharge.  Please      obtain fasting lipid panel in the outpatient setting.  5. History of obstructive sleep apnea.  The patient has a CPAP machine      at home, however, was not started on CPAP during the course of his      hospitalization as he refused.  The patient would benefit from a      new mask and re-titration; however, he cannot afford this at      present given that he is unemployed.  6. Chest discomfort.  The patient presented with chest discomfort,      which was believed to be musculoskeletal secondary to the patient's      cough.  Cardiac enzymes as above with negative troponins x3.  EKG      without ischemic changes.  The patient was started on aspirin, beta-      blocker, and statin during the course of hospitalization and will      be discharged on these as described above.   DISCHARGE INSTRUCTIONS:  The patient was given instructions for a low-  sodium, heart-healthy diet, given instructions  to increase activity  slowly, as well as to return to care if worsening dyspnea or any other  concerns.  The patient was also given instructions to follow up at the  [facility] and then follow up with Bonna Gains  regarding medication assistance.    FOLLOWUP APPOINTMENTS:  The patient will follow up with Dr. Sherilyn Cooter at  [facility] on September 21, 2008, at 3:30 p.m.   DISCHARGE CONDITION:  The patient was discharged to home in stable and  improved condition.      Mariana Arn, MD  Electronically Signed      Blane Ohara McDiarmid, M.D.  Electronically Signed    KC/MEDQ  D:  09/14/2008  T:  09/15/2008  Job:  [id number]

## 2011-02-06 NOTE — Consult Note (Signed)
NAME:  MIGUEL, LIANG NO.:  [id number]   MEDICAL RECORD NO.:  [id number]          PATIENT TYPE:  INP   LOCATION:  [id number]                         FACILITY:  [place]   PHYSICIAN:  Legrand Como L. Reynolds, M.D.DATE OF BIRTH:  1956-08-13   DATE OF CONSULTATION:  05/20/2007  DATE OF DISCHARGE:                                 CONSULTATION   REQUESTING PHYSICIAN:  Blane Ohara McDiarmid, M.D.   REASON FOR EVALUATION:  Left leg weakness.   HISTORY OF PRESENT ILLNESS:  This is the initial inpatient consultation  evaluation of this 55 year old man with a past medical history which  includes diabetes in poor control as well as several other medical  problems.  History is reviewed with the patient.  He tells me that on  Sunday night he went to bed without any problems, and he recalls getting  up at 1:30 and ambulating to the bathroom without difficulty.  He then  got up again around 3:30 or 4 a.m. and notes that his left leg fell  funny.  He had a little difficulty walking on it as if it was weak, but  he was able to get back and forth to the bathroom.  The sensations were  still present when he awakened at about 6 a.m.  He was able to hobble  around enough to get ready to go to work and he went to work but then  later in the day went to an urgent care physician, who advised him to go  to the emergency department for admission and further evaluation.  He  thinks that his symptoms have been stable in the hospital, perhaps a  little bit better.  He does still feel a funny sensation in his left  foot when he stands and walks on it.  He says it is as if something is  covering his foot, and there is a sensation of electricity when he tries  to stand on it.  He states that when these symptoms initially started he  was not able to wiggle his toes, but he has been able to do that since  he awakened yesterday.  He says nothing like this has ever happened  before.  He does have some baseline  numbness in his right foot but that  this is not any worse than usual.  He denies any low back pain.  He  denies any associated symptoms in the face or arm.  He was admitted to  the family practice teaching service, who serve as his primary  physicians, and he has had a stroke workup including MRI of the brain,  which was unremarkable.  Neurologic consultation is requested for  further considerations.   PAST HISTORY:  1. He has history of diabetes, with which he admits being      noncompliant.  2. History of coronary artery disease with stent placement 3 years      ago.  3. Hypertension.  4. Hyperlipidemia.  5. Morbid obesity.   Family/social/review of systems as outlined in the admission H&P of  May 19, 2007, which is reviewed.  It is noted that  he admits to  largely being noncompliant with his medications.   MEDICATIONS:  Sliding scale insulin, Lovenox, lisinopril, aspirin,  Coreg.   EXAMINATION:  VITAL SIGNS:  Temperature 98.8, blood pressure 152/95,  pulse 96, respirations 20, O2 saturation 95% on room air.  CBG is 170 to  238.  GENERAL EXAMINATION:  This is an obese, healthy-appearing man supine in  the hospital bed, in no evident distress.  HEAD:  Cranium is normocephalic and atraumatic.  Oropharynx benign.  NECK:  Supple without carotid or supraclavicular bruits.  HEART:  Regular rhythm without murmurs.  NEUROLOGIC:  Mental status:  He is awake and alert.  He is oriented to  time, place and person.  Recent and remote memory are intact.  Attention  span, concentration and fund of knowledge are all appropriate.  His  speech is fluent and not dysarthric.  He is able to name objects and  repeat phrases without difficulty.  Cranial nerves:  Pupils are equal  and reactive.  Extraocular movements full without nystagmus.  Visual  fields full to confrontation.  Hearing is intact to conversational  speech.  Facial sensation intact to pinprick.  Face, tongue and palate  move  normally and symmetrically.  Motor:  Normal bulk and tone.  He has  a very slight weakness of the toe extensors, dorsiflexors and evertors  of the left foot.  There is a bit of give-a way when testing the  inverters of the left ankle.  Otherwise normal strength throughout.  Sensory:  He reports allodynia to light touch over the lateral aspect of  the left foot, a little bit of hyperpathia to pinprick in that same  region.  He also reports diminished pinprick sensation in both toes,  worse on the left.  Otherwise intact to light touch, pinprick and double  simultaneous stimulation in all extremities.  Coordination:  Rapid  movements are performed accurately.  Heel-to-shin and finger-to-nose are  performed accurately.  Gait:  He arises slowly from a chair.  He can  ambulate with some assistance but clearly is favoring his right leg.  He  is able to stand on his toes and heels least briefly.  Reflexes are  diminished throughout, absent at both ankles.  Toes are downgoing  bilaterally.   LABORATORY REVIEW:  CBC from this morning:  White count 7.7, hemoglobin  12.7, platelets 243,000.  BMET remarkable for an elevated glucose of  218.  Hemoglobin A1c is elevated at 8.4.  Results of his lipid profile,  including very high total cholesterol and triglycerides and low HDL, are  noted.  MRI of the brain performed yesterday  with and without contrast  is personally reviewed.  The study is basically remarkable,  demonstrating only minimal white matter disease.  There is only mild  atherosclerotic disease of the large intracranial vessels.   IMPRESSION:  1. 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  2. Multiple vascular risk factors as noted above.   RECOMMENDATIONS:  Would treat his paresthesias symptomatically with  Neurontin.  I think when his pain is treated he will be able to walk   around much better is his weakness as really pretty minimal.  If he is  not any better in 2-3 weeks, consider EMG/NCV for further evaluation.  He also needs to continue taking his aspirin and modifying his risk  factors as  noted above.   Thank you for this consultation.      Michael L. Doy Mince, M.D.  Electronically Signed     MLR/MEDQ  D:  05/20/2007  T:  05/21/2007  Job:  [id number]
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
## 2011-02-06 NOTE — Discharge Summary (Signed)
NAME:  Jonathon Bailey, Jonathon Bailey NO.:  [id number]   MEDICAL RECORD NO.:  [id number]          PATIENT TYPE:  OBV   LOCATION:  [id number]                         FACILITY:  [facility]   PHYSICIAN:  Blane Ohara McDiarmid, M.D.DATE OF BIRTH:  12-23-1955   DATE OF ADMISSION:  10/06/2007  DATE OF DISCHARGE:  10/08/2007                               DISCHARGE SUMMARY   PRIMARY CARE PHYSICIAN:  Dr. [name] A. Daub at [facility].   REASON FOR ADMISSION:  Cough and shortness of breath.   HISTORY OF PRESENT ILLNESS:  The patient is a 55 year old male with a  history of hypertension, diabetes, hyperlipidemia and known coronary  artery disease who presented with a 1-1/2-week history of feeling tired  and having a cough.  The patient stated it was productive.  He has  subjective fevers.  He has not been feeling well for this same time  period.  He had decreased energy.  He denied any nausea, vomiting,  diarrhea or chills.   DISCHARGE DIAGNOSES:  1. Right upper lobe community-acquired pneumonia.  2. Hypertension.  3. Type 2 diabetes mellitus.   HOSPITAL COURSE:  #1 - RIGHT UPPER LOBE PNEUMONIA:  He was started on  Rocephin and azithromycin.  The patient did not have any fevers and did  have a normal white count; however, a chest x-ray did show a right upper  lobe consolidation and therefore he was started on antibiotics.  Also  the patient was slightly tachycardic and short of breath upon admission.  He was not hypoxic, however.  The D-dimer was drawn to evaluate for a  pulmonary embolism.  It was 0.6, which is elevated.  Therefore he did  receive a CT angiogram.  The CT angiogram was negative for pulmonary  embolism; however, it did show bilateral hilar and mediastinal nodal  engorgement suggesting opportunistic infection like Mycobacterium or  fundus, versus a neoplasm like lymphoma or sarcoid.  Recommended  followup in three to six months.  There was no dominant pulmonary mass  and  regarding his consolidation, it did show air space and opacities in  the right upper lobe, lingula and right lower lobe.  This was discussed  with the patient.  He has only a family history of prostate cancer in  his father.  He denies any possibility of exposure to tuberculosis or  HIV.  By the day of admission, the patient was feeling much improved.  He still had a cough but his energy level and overall malaise was much  improved.  All we had given him while in the hospital were antibiotics.  It was discussed with the team, and the general feeling was that this  had an acute onset of only 1-1/2 weeks ago.  The patient has been  feeling clinically better on antibiotics.  No further inpatient workup  is needed at this time.  For the adenopathy he will be continued on  Avelox for five more days at discharge.  He is to follow up in one to  two weeks with his primary care physician, who can arrange for a repeat  CT scan in three  to six months, to ensure a resolution of these nodes,  or to initiate further workup if indicated.   #2 - HYPERTENSION:  The patient's blood pressure did remain in the 140's  to 180's over 90's to 100's during his stay here.  He is on Lasix 80 mg  twice daily, Lisinopril 40 mg twice daily, Micardis 80 mg daily and  Coreg 6.25 mg twice daily.  He is maxed out on everything but Coreg.  At  the time of discharge Coreg was increased to 12.5 mg twice daily.  The  patient should follow up his hypertension with his primary care  physician.   #3 - TYPE 2 DIABETES MELLITUS:  He also demonstrated poor control while  in the hospital.  He was kept on his home insulin regimen, sliding scale  insulin.  He was not here long enough for further tweaking of his  insulin regimen.  This should be followed up as an outpatient by his  primary care physician.   DISPOSITION:  The patient will be discharged to home.   CONDITION ON DISCHARGE:  Stable.   FOLLOWUP:  The patient was  instructed to call Dr. Everlene Farrier and schedule an  appointment in one to two weeks, for further followup.  His issues for  followup primarily by his PCP are the results of the CT angiogram and  ensuring that he does have a repeat CT scan in six months, to ensure  resolution of this, or further as indicated.   DISCHARGE MEDICATIONS:  1. Aspirin 81 mg daily.  2. Lasix 80 mg twice daily.  3. Lisinopril 40 mg twice daily.  4. Micardis 80 mg daily.  5. Lantus 50 units subcu q.h.s.  6. Coreg 12.5 mg twice daily.  This is a new dose.  The patient was      given a prescription.  7. Avelox 400 mg for five days.      Orland Mustard, MD  Electronically Signed      Blane Ohara McDiarmid, M.D.  Electronically Signed    LM/MEDQ  D:  10/08/2007  T:  10/08/2007  Job:  [id number]
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
## 2011-02-06 NOTE — H&P (Signed)
NAME:  Jonathon Bailey, Jonathon Bailey NO.:  [id number]   MEDICAL RECORD NO.:  [id number]          PATIENT TYPE:  INP   LOCATION:  [id number]                         FACILITY:  [place]   PHYSICIAN:  Blane Ohara McDiarmid, M.D.DATE OF BIRTH:  1956-01-08   DATE OF ADMISSION:  05/19/2007  DATE OF DISCHARGE:                              HISTORY & PHYSICAL   CHIEF COMPLAINT:  Left lower extremity weakness and numbness at 4:00  a.m.   HISTORY OF PRESENT ILLNESS:  Patient is a 55 year old gentleman who  presented to [facility] for acute numbness and weakness of left  lower extremity beginning at 4:00 a.m. while patient was up using the  rest room.  Patient stated by 6:00 a.m. he was almost unable to walk,  but by the time he was seen at urgent care this was improving.  Patient  describes a numbness like when you sleep on it funny.  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  At  the time of exam, patient with feeling on entire left lower extremity,  but feet sensation below the knee is different than on the right.   PAST MEDICAL HISTORY:  Significant for:  1. Hypertension.  2. Diabetes.  3. CAD status post stenting in 2005.  4. Hyperlipidemia.  5. Obesity.   PAST SURGICAL HISTORY:  Significant for:  1. Cath in 2005 status post stents.  2. LASIK in 2008.   MEDICATIONS:  Include:  1. Lasix 80 mg b.i.d.  2. Carvedilol 6.25 mg b.i.d.  3. Micardis 80 mg daily.  4. Lisinopril 40 mg b.i.d.  5. Insulin with unknown type or dose.   ALLERGIES:  No known drug allergies.   FAMILY HISTORY:  Mother had hypertension and obesity.  Father died at  age 81 secondary to CHF.   Patient has two brothers and one sister who  are healthy.   SOCIAL HISTORY:  Patient is a previous smoker, has three children, and  drinks one to two drinks daily.   PHYSICAL EXAMINATION:  VITAL SIGNS:  Temp 98.2, blood pressure 155/90,  pulse 88, respirations 18, sating 98% on room air.  GENERAL:  Patient awake, alert and oriented x3 in no acute distress.  HEENT:  Pupils are equal, round and reactive to light.  Extraocular  muscles intact.  Patient has moist mucous membranes without pharyngeal  erythema or exudate.  CARDIOVASCULAR:  Patient has regular S1-S2.  LUNGS:  Clear to auscultation bilaterally.  ABDOMEN:  Obese, soft, nontender, nondistended.  Positive bowel sounds.  EXTREMITIES:  No clubbing, cyanosis or edema with +1 DP and PT pulses  bilaterally.  NEURO EXAM:  Cranial nerves II-XII grossly intact.  Upper extremity  strength 5/5 and symmetric.  Right lower extremity strength 5/5  throughout.  Left lower extremity strength 3/5 on dorsi and plantar  flexion, 4/5 on quad strength.  Sensation is intact bilaterally with  left lower extremity paresthesias below the knee.  Gait was untested.   LABORATORY DATA:  WBC 8.5, hemoglobin 13.7, hematocrit 40.4, platelets  275, 53% neutrophils, 35% lymphs, 7% monos, 5% eosinophils, 1%  basophils.  Sodium 136, potassium 3.6, chloride 103, bicarb 23.  BUN 16,  creatinine 1.16, glucose 363.  T bili 0.9, alk phos 66, AST 17, ALT 17,  total protein 7.1, albumin 3.3, calcium 9.2.  CK 304, MB 5.2, index 1.7,  troponin 0.2.  PT 12.1, INR 0.9.   CT of head shows no acute intracranial process with no significant  change compared with prior CT and chronic sinusitis.   ASSESSMENT/PLAN:  This is a 55 year old gentleman with history of  noncompliance who developed acute left-sided weakness and numbness of  left lower extremity.   1. Weakness.  Head CT negative for acute bleed.  Will check MRI/MRA, 2-      D echo and EKG.  To consider a neuro  consult if no improvement or      obvious cerebrovascular accident on subsequent workup.  Likely, a      transient ischemic attack, as patient has had some improvement      since the onset of these symptoms.  Will start aspirin 325 mg, as      there is no obvious bleed on CT.  Patient needs aggressive risk      reduction.  2. Coronary artery disease.  Patient is noncompliant with meds      according to multiple providers.  He is status post cath with      stents in 2005.  Will check EKGs, cardiac enzymes x3 and check a      fasting lipid panel.  Patient will likely need aggressive risk      reduction with his  cholesterol, as he has not been on a statin.  3. Diabetes.  Patient has no idea what his home regimen is supposed to      be; states he takes 50 units of insulin, type unknown, when he      feels like it.  Patient started on resistant sliding-scale insulin      to determine insulin need; will need a new home regimen that he      will be able to follow.  Patient's hemoglobin A1c is pending.  4. 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  Creatinine 1.16      and this diagnosis was never biopsy proven.  Sees Dr. Jimmy Footman      with [facility].  Will follow patient's creatinine      during this admission.  5. Hyperlipidemia.  Not currently on a statin.  Fasting lipid panel      pending.  6. Prophylaxis.  Will start patient on Lovenox 40 mg for deep vein      thrombosis prophylaxis.  7. FEN/GI:  Patient is on an ADA heart-healthy diet.   DISPOSITION:  Pending both TIA and CVA evaluations.      Annye Asa, M.D.  Electronically Signed      Blane Ohara McDiarmid, M.D.  Electronically Signed    KT/MEDQ  D:  05/20/2007  T:  05/20/2007  Job:  [id number]
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
## 2011-02-06 NOTE — H&P (Signed)
NAME:  Jonathon Bailey, Jonathon Bailey NO.:  [id number]   MEDICAL RECORD NO.:  [id number]          PATIENT TYPE:  OBV   LOCATION:  [id number]                         FACILITY:  [facility]   PHYSICIAN:  [name], M.D.DATE OF BIRTH:  21-Jul-1956   DATE OF ADMISSION:  09/13/2008  DATE OF DISCHARGE:                              HISTORY & PHYSICAL   PRIMARY CARE PHYSICIAN:  [name] A. Everlene Farrier, MD, at [facility].   CARDIOLOGIST:  Shelva Majestic, MD, at [facility].   KIDNEY DOCTOR:  [name], MD, at [facility].   CHIEF COMPLAINT:  Shortness of breath.   HISTORY OF PRESENT ILLNESS:  This is a 55 year old obese African  American male with a history of diabetes; hypertension; hyperlipidemia;  and coronary artery disease, status post MI x2 and 3 stents who presents  to the emergency department with increased shortness of breath for  approximately 1 month.  The patient reports, he started having an  associated cough approximately 1 month ago as well, which is mildly  productive.  He denies any frank fever or chills.  He states, his  symptoms, however, feels similar when he had pneumonia approximately 1  year ago in January 2009.  The patient does report associated chest  burning, stinging, and discomfort with his coughing, however, denied  frank chest tightness or heaviness.  He does, however, report 4-pillow  orthopnea for at least the last week, which has worsened to the point  that had been sleeping sitting up for the past 1-2 nights.  He has had  associated nasal congestion and runny nose.  He has tried taking over-  the-counter Mucinex for approximately 1 week, which did help initially,  but his dyspnea on exertion became worse during the last week.  The  patient was scared by his friends for recent loss of his son, secondary  to asthma exacerbation, which has what prompted him to come in.  He had  no personal history of asthma or  COPD, however.   REVIEW OF SYSTEMS:  The patient denies fever or chills.  Positive cough,  in addition, shortness of breath per HPI.  Positive chest discomfort per  HPI.  Denies nausea, vomiting, or diarrhea.  Does report mild abdominal  bloating.  Also, reports a recurrent ventral hernia.  Denies dysuria,  hematuria, bloody, or melanotic stools.  Does report mild lower  extremity edema for approximately 1 week.   PAST MEDICAL HISTORY:  1. Diabetes.  2. Hypertension.  3. Hyperlipidemia.  4. Coronary artery disease, status post MI x2 and 3 stents placed in      approximately 2003.  5. History of hospitalization in January 2009, for community-acquired      pneumonia.  6. Questionable history of transient ischemic attack.  7. Obstructive sleep apnea with CPAP [place] at home; however, the      patient is not using.  8. Proteinuria.  9. Status post hernia repair x2.  10.Status post normal colonoscopy in March 2009.   ALLERGIES:  No known drug allergies.   HOME MEDICATIONS:  1. Aspirin 81 mg p.o. daily.  2. Lasix 80 mg p.o. b.i.d.  3. Lisinopril 40 mg p.o. b.i.d.  4. Micardis 80 mg p.o. daily.  5. Fenofibrate 54 mg 2 tablets p.o. nightly.  6. Simvastatin 40 mg p.o. nightly.  7. Lantus 70 units subcu daily.  8. NovoLog as needed.   Of note:  The patient reports running out of all his medications except  for a baby aspirin at least 2 months ago.   FAMILY HISTORY:  The patient's father has history of CVA, rheumatic  fever, MI, CABG x4, prostate cancer, and DVTs.  The patient's mother has  a history of asthma.  The patient's aunt has a history of DVTs.   SOCIAL HISTORY:  The patient is currently unemployed.  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  He currently lives with his wife  and son.  He has 2 sons in college.  He quit smoking cigarettes  approximately 20-25 years ago, reports occasional alcohol use.  Denies  recreational drug use including cocaine.    PHYSICAL EXAMINATION:  VITAL SIGNS:  Temperature is 97.0, heart rate 109-  114, respiratory rate 20-25, blood pressure 161-187/114-128, oxygen  saturation 99% on 2 L.  GENERAL:  The patient is obese, in no acute distress, alert and oriented  x3.  HEENT:  Head is normocephalic and atraumatic.  Pupils are equal, round,  reactive to light, and accommodation.  Extraocular movements are intact.  The patient's mucous membranes and oropharynx is without erythema or  exudate.  NECK:  Supple with no lymphadenopathy.  CARDIOVASCULAR:  Heart is regular rate and rhythm.  No murmurs, rubs, or  gallops and 1+ dorsalis pedis and posterior tibialis pulses.  LUNGS:  Bilateral occasional wheezes in lower lungs with slight  decreased air movement.  ABDOMEN:  Normoactive bowel sounds with firm, nontender with midline  incision and questionable recurrent ventral hernia, which is reducible.  EXTREMITIES:  Trace lower extremity edema bilaterally.  SKIN:  Dry with no rash or lesions.  Foot inspection is normal.  NEUROLOGIC:  Cranial nerves II-XII are grossly intact.  Mild peripheral  neuropathy, symmetric strength and sensation.   LABORATORY DATA:  BNP is elevated at 279, it was normal at 68 on  admission in January 2009.  Point-of-cares in the ED were elevated CK-MB  of 8.6 and myoglobin of 210 with normal troponin of less than 0.05, i-  STAT 8 reveal sodium of 140, potassium of 4.2, chloride of 106, bicarb  of 27, BUN of 10, creatinine 1.3 and glucose 273.  CBC with differential  is pending.  Chest x-ray shows new bilateral patchy pulmonary  infiltrates favoring pneumonia of pulmonary edema.   EKG shows normal sinus rhythm.  No acute ischemic changes and QTC of  497.   ASSESSMENT AND PLAN:  This is a 55 year old obese African American male  with a history of diabetes, coronary artery disease, hypertension, and  hyperlipidemia who has been without medications for at least 2 months  and presents with  new-onset bilateral pneumonia and shortness of breath.   1. Shortness of breath:  It is likely secondary to pneumonia, however,      may also be mild congestive heart failure component.  The patient      has been out of meds including his Lasix for at least 2 months.      BNP is mildly elevated at 279, thus we will resume home dose of      Lasix 80 mg p.o. b.i.d. in addition  to treating pneumonia as below.   1. Pneumonia:  The patient has bilateral infiltrates on chest x-ray.      This is community-acquired.  The patient was treated for community-      acquired pneumonia successfully 1 year ago as well in January 2009.      He has already received Rocephin and Azithromycin in the emergency      department.  We will continue as the patient has no risk factors      for health care associated pneumonia or hospital-acquired      pneumonia.  We will wean oxygen as tolerated.   1. Chest discomfort:  Likely musculoskeletal, secondary to cough,      however, given risk factors for coronary artery disease, we will      rule out with cardiac enzymes x3 and repeat EKG in the morning.  We      will place on telemetry, aspirin was already been given in the      emergency department.  We will continue it daily.  We will also      start the patient on low-dose beta-blocker with metoprolol 12.5 mg      p.o. b.i.d.Marland Kitchen   1. Diabetes:  The patient is unable to afford Lantus.  He has been      without insulin for greater than or equal to 2 months.  Given his      CBG is only of 200s in the emergency department, we will start with      sliding scale insulin dose as to 24-hour requirement and check A1c      before deciding on a long-term regimen.  We would consider NPH in      stead of Lantus, given the patient's current financial situation.      The patient's creatinine is mildly elevated at 1.3, thus we will      hold off on resuming his ACE inhibitor.  The patient will likely      need something other  than an angiotensin receptor blocker at the      time of discharge as he is unable to afford this any longer.   1. Hypertension:  We will restart the patient's home dose of Lasix for      now.  We will add low-dose beta blocker secondary to chest      discomfort.  We will restart ACE inhibitor when creatinine      normalizes.  The patient was previously on lisinopril plus      Micardis; however, he will not be able to afford an ARB at      discharge.   1. Hyperlipidemia:  We will resume the patient's home dose of      simvastatin and check LFTs.  We will await to check fasting lipid      panel after the patient has been back on his statin as he does not      have any insurance at present and this will be more useful for      titrating medications.   1. History of obstructive sleep apnea:  The patient is not using his      CPAP machine at home currently and refuses.  He would benefit from      new mask and retitration as this mask is old and bulky per his      report.  However, he cannot afford this at present as he is      unemployed.  1. Fluids, electrolytes, and nutritions/gastrointestinal:  We will      place the patient on carbohydrate-modified diet and saline lock IV      fluids.   1. Prophylaxis:  We will give Lovenox for deep vein thrombosis      prophylaxis.   1. Disposition:  Pending, rule out for myocardial infarction and      improving pneumonia, on p.o. meds.  The patient will need financial      assistance and a primary MD at the time of discharge.      Shella Maxim, M.D.  Electronically Signed      [name], M.D.  Electronically Signed    EE/MEDQ  D:  09/13/2008  T:  09/14/2008  Job:  [id number]

## 2011-02-06 NOTE — Discharge Summary (Signed)
NAME:  Jonathon Bailey, Jonathon Bailey NO.:  [id number]   MEDICAL RECORD NO.:  [id number]          PATIENT TYPE:  INP   LOCATION:  3038                         FACILITY:  [facility]   PHYSICIAN:  Jamal Collin. Hensel, M.D.DATE OF BIRTH:  [birth year]   DATE OF ADMISSION:  04/11/2009  DATE OF DISCHARGE:  04/15/2009                               DISCHARGE SUMMARY   REASON FOR ADMISSION:  Headache and blurry vision.   CONSULTANT:  Neuro and Cardiology.   PRIMARY CARE PHYSICIAN:  Dr. Sherilyn Cooter at [facility].   PRIMARY DISCHARGE DIAGNOSES:  1. Cerebrovascular accident.  2. Hypertension.  3. Hyperlipidemia.  4. Congestive heart failure.  5. Diabetes.  6. Coronary artery disease status post myocardial infarction x2 and 3      stents placed in approximately 2003.  7. Obstructive sleep apnea.   MEDICATIONS:  Stopped and not restarted metoprolol.  Meds continued on  discharge:  1. Coreg 25 mg by mouth 2 times a day (this is a new medication      started this admission by Cardiology).  2. Aspirin 325 mg by mouth daily (started by Neuro's recommendation).  3. Lasix 80 mg by mouth 2 times a day.  4. Zocor 80 mg by mouth daily.  5. Metformin 500 mg by mouth 2 times a day.  6. Glipizide 5 mg by mouth daily.  7. Lisinopril 20 mg by mouth daily (prior to admission was taking 10      mg, during admission increased to 20 mg daily).   PROBLEMS:  1. Cerebrovascular accident, imaging showed right occipital, right PCA      infarct.  Neuro team was consulted and followed the patient during      admission.  The patient was started on aspirin therapy.  Symptoms      of CVA were headache and blurry vision, these persisted during      hospitalization stay and will be followed by Neuro as an      outpatient.  We were concerned there was an embolic infarct      secondary to his low EF that was found on echocardiogram.  TEE was      obtained and did not show a source of clot or embolism.   Therefore,      the patient continued on aspirin therapy.  The patient is to see      Dr. Leonie Man as an outpatient for a TCD emboli monitoring/bowel study.      He is to call and set up his appointment.  Also, the patient needs      to see Cardiology, at [facility].  He is to call and      set up [place] monitor for evaluation for paroxysmal AFib.  2. Hypertension.  The patient is to take lisinopril 20 mg daily and      Coreg 25 mg by mouth 2 times a day.  Metoprolol was stopped during      this admission by Cardiology.  During admission, blood pressure was      allowed to increase to 99991111 systolic without any p.r.n. medication,  this was the guideline set up by the stroke team in the setting of      acute stroke.  At the time of discharge, we will begin to reassess      and as an outpatient, we will need to be reevaluated to see if new      agents are needed to get his blood pressure now within normal      limits now that we are outside the acute phase of the CVA.  3. Hyperlipidemia.  Direct LDL was 239, total cholesterol was 388,      triglycerides 525, and HDL 46.  The patient was not compliant with      Zocor therapy.  The patient informed of the importance of taking      Zocor.  The patient is to take Zocor 80 mg on discharge.  She was      given this during hospitalization.  At next followup appointment      with PCP needs to have cholesterol reevaluated to see if he is in      need of a stronger agent to lower his lipids.  4. Congestive heart failure.  The patient has an EF of 25-35% with      diffuse hyperkinesis.  He is taking Lasix 80 mg b.i.d.  BNP was      elevated during hospitalization, this was expected due to the      patient's low EF.  The patient was changed by Cardiology to Coreg      25 mg during this admission.  Metoprolol was stopped.  The patient      also on lisinopril 20 mg by mouth daily.  The patient was not aware      of his low EF.  The patient  was informed of the echocardiogram and      the result and the importance of taking his medication as well as      dietary changes.  5. Diabetes.  Blood glucose levels elevated during hospitalization.      A1c was 9.8.  Blood sugars ran in the high 200 during most of the      hospitalization.  Consider starting insulin, but the patient was      resistant to this idea.  The patient had not been taking metformin      and glipizide per home regimen.  Consistently, he was not compliant      with diet or exercise.   PLAN:  The patient is to take metformin 500 mg by mouth b.i.d. as well  as glipizide 5 mg by mouth daily.  He is to follow up with his PCP to  reevaluate his blood glucose levels and to see if he is a candidate for  increasing his p.o. medications or needing to be switched to insulin  therapy.   FOLLOWUP APPOINTMENTS:  The patient is to see Dr. Sherilyn Cooter on Monday,  April 25, 2009, at 1:30 p.m.  He is also to call Dr. Leonie Man to schedule a  TCD emboli monitoring/bubble study.  He is also to call [facility]  Cardiology to set up [place] monitor to evaluate for paroxysmal AFib.  The patient was given these numbers for him to obtain these  appointments.   FOLLOWUP ISSUES:  Those outlined in the above problem list.  Also, PCP  may consider referring patient again to the diabetic education program.  He went to classes in the past, but appears to be more motivated to  learn  the skills that are needed for self management at this point.      Dorthey Sawyer, MD  Electronically Signed      Jamal Collin. Andria Frames, M.D.  Electronically Signed    DC/MEDQ  D:  04/15/2009  T:  04/16/2009  Job:  [id number]

## 2011-02-09 NOTE — Cardiovascular Report (Signed)
NAME:  Jonathon Bailey, Jonathon Bailey                       ACCOUNT NO.:  [id number]   MEDICAL RECORD NO.:  [id number]                   PATIENT TYPE:  INP   LOCATION:  [id number]                                 FACILITY:  [facility]   PHYSICIAN:  Shelva Majestic, M.D.                  DATE OF BIRTH:  [birth year]   DATE OF PROCEDURE:  01/28/2004  DATE OF DISCHARGE:                              CARDIAC CATHETERIZATION   INDICATIONS:  [name] is a 55 year old African-American male  with history of hypertension, poorly controlled type 2 diabetes mellitus,  and obesity.  He presented to [facility] on Jan 23, 2004 after  developing new onset chest pain radiating to his neck.  His ECG at that time  was nondiagnostic, but showed sinus tachycardia.  Initial troponins were  negative.  He subsequently ruled in for non-ST-segment elevation myocardial  infarction.  On Jan 24, 2004, cardiac catheterization was performed and at  that time he underwent percutaneous transluminal coronary angioplasty  stenting of a totally occluded right coronary artery.  He has been hydrated.  He has been started on additional medications for blood pressure control.  In addition, his diabetes has been out of control and ultimately that has  been managed by Dr. Electa Sniff.  He is now brought back to the catheterization  laboratory today for staged intervention to a very large intermediate vessel  with significant stenoses at two sites in a superior branch of this very  large intermediate system.   DESCRIPTION OF PROCEDURE:  After premedication with Valium 5 mg  intravenously, the patient was prepped and draped in the usual fashion.  His  right femoral artery was punctured anteriorly and a 6 French sheath was  inserted.  Initially, right diagnostic catheter was used, 6 [place].  This  verified widely patent stent in the proximal right coronary artery with  continued TIMI-3 flow.  Attention was therefore directed at the left  coronary system and particularly in the very large dominant ramus  intermediate vessel.  Angiography again showed 80% proximal stenosis in the  first branch of this vessel followed by another diffuse area of 80% stenosis  in the mid portion of this branch.  Double bolus integrilin and 6000 units  of weight-adjusted heparinization were administered.  The patient has been  maintained on Plavix.  A Luge wire was advanced down the branch of this  intermediate vessel.  A 2.5 x 23-mm stent was then successfully deployed at  the mid segment.  This was dilated to 17 atmospheres.  A 3.0 x 13-mm stent  was then deployed in the proximal portion of this branch just at the ostium  arising from the main intermediate vessel.  Post stent dilatation was done  at both sites utilizing a 3.25 x 12-mm Quantum balloon with dilatation at  the mid site up to 2.9 atmospheres and dilatation at the proximal site up to  3.25 mm.  Scout angiography confirmed an excellent angiographic result with  both sites being reduced to 0%.  There was no evidence for dissection.  There was TIMI-3 flow.   HEMODYNAMIC DATA:  Central aortic pressure was elevated 165/107, mean 134.  For this reason, IC nitroglycerin as well as intracoronary nitroglycerin  were administered were administered during the procedure.   ANGIOGRAPHIC DATA:  Relook at the right coronary artery showed a widely  patent stent at the site of prior acute occlusion from the procedure done on  Jan 24, 2004.  This had a 2.5 x 18-mm Cypher stent in place which was post  dilated to 2.75 mm at the time of the intervention.  There was excellent  TIMI-3 flow.   The intermediate vessel was unchanged from previously and again was a very  large dominant vessel that extended all the way to the apex and was larger  than the LAD and the AV groove circumflex.  In its proximal portion, it gave  rise to a marginal like vessel which was bifurcating in its mid segment.  There was  an 80% ostial stenosis of this marginal branch followed by another  diffuse 80% stenosis in its mid segment after branch point.  Following post  dilatation of this 2.5 x 18-mm Cypher stent to 2.9 mm, the 80% stenosis was  reduced to 0%.  The more proximal 3.0 x 13-mm Cypher stent was post dilated  to 3.25 mm.  The 80% stenosis was reduced to 0%.  There was TIMI-3 flow.   IMPRESSION:  1. Successful primary stenting of a large bifurcating marginal branch of the     ramus intermediate vessel with the mid site being dilated utilizing a 3.0     x 13-mm Cypher drug-eluting stent post dilated to 3.25 mm with both sites     being reduced from 80% to 0%.  2. No restenosis at the site of prior intervention Jan 24, 2004 of a totally     occluded proximal right coronary artery with residual narrowing 0% and     with maintenance of TIMI-3 flow.                                               Shelva Majestic, M.D.    TK/MEDQ  D:  01/28/2004  T:  01/29/2004  Job:  [id number]   cc:   Parke Poisson. Electa Sniff, M.D.  [id number] [address] [place] [address]  Fax: [phone]   Lina Sayre. Everlene Farrier, M.D.  [address]  [place]  [place] [address]  Fax: [phone]

## 2011-02-09 NOTE — Consult Note (Signed)
Jonathon Bailey, KOEPPE                       ACCOUNT NO.:  [id number]   MEDICAL RECORD NO.:  [id number]                   PATIENT TYPE:  INP   LOCATION:  2908                                 FACILITY:  [facility]   PHYSICIAN:  Parke Poisson. Electa Sniff, M.D.             DATE OF BIRTH:  April 18, 1956   DATE OF CONSULTATION:  DATE OF DISCHARGE:                                   CONSULTATION   REFERRING PHYSICIAN:  Dr. Shelva Majestic.   HISTORY:  This is a 55 year old man who presented to the hospital with a  myocardial infarction.   He has a history of diabetes mellitus which has been present since the year  2000.  His diabetes management apparently has been less than adequate.  He  has been taking 50 units of 70/30 three times a day and probably has not had  a significant dietary approach to his diabetes.  He checks his glucose  occasionally but has not checked it in the last several weeks due to a  faulty meter.  He is in the hospital now and his glucoses have been in the  200s.  He has been on a sliding scale.   He has a history of gastroesophageal reflux disease and he takes Protonix  for this periodically.   He has a history of dyslipidemia.  His triglycerides in the hospital are  extremely abnormal.  His total triglycerides were 2088, total cholesterol  was 238, HDL was 24.  The patient states that he was not taking any lipid-  lowering agents prior to his admission to the hospital.   He has a history of hypertension and has been taking Altace at 5 mg prior to  his admission.   PERSONAL HISTORY:  He used to smoke but stopped approximately 10 years ago  (in 1995).  He denies excessive alcoholic intake.   ALLERGIES:  No history of allergies.   FAMILY HISTORY:  The mother is 75 years old and has hypertension and is  obese.  The father died at the age of 53 from heart failure.  He has 2  brothers and 1 sister and neither of them have diabetes.  He is divorced and  has 3 children.  One child  is 79 years old.   REVIEW OF SYSTEMS:  His weight has been increasing.  CARDIOVASCULAR/RESPIRATORY:  See above.  GI:  See above.  GU:  No  complaints.   PHYSICAL EXAMINATION:  GENERAL:  This is a well-developed but very obese man  in no distress.  SKIN:  His skin is warm.  No xanthomata are present.  LUNGS:  His lungs are clear.  CARDIOVASCULAR:  Rhythm is regular.  ABDOMEN:  His abdomen is soft.  No masses are present.  EXTREMITIES:  Extremities reveal dorsalis pedis pulses to be equal  bilaterally.  Touch sensation in the feet is normal.   IMPRESSION:  1. 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  2. Diabetes with poor control (type 2, requiring insulin).  3. History of dyslipidemia.  4. History of hypertension.                                               Parke Poisson. Electa Sniff, M.D.    CGG/MEDQ  D:  01/25/2004  T:  01/26/2004  Job:  [id number]

## 2011-02-09 NOTE — H&P (Signed)
NAMESENCERE, VANNAME                       ACCOUNT NO.:  [id number]   MEDICAL RECORD NO.:  [id number]                   PATIENT TYPE:  EMS   LOCATION:  MAJO                                 FACILITY:  [facility]   PHYSICIAN:  Claiborne Billings, Dr.                          DATE OF BIRTH:  1956-02-25   DATE OF ADMISSION:  01/23/2004  DATE OF DISCHARGE:                                HISTORY & PHYSICAL   CHIEF COMPLAINT:  Chest pain.   HISTORY OF PRESENT ILLNESS:  Mr. Mcnabb is a 55 year old male with no prior  history of coronary disease.  He has a history of diabetes and hypertension.  He is followed by Dr. Marcellus Scott.  This morning, he awakened at 4 a.m.  with substernal chest pain and tightness which radiated down inside of both  arms to his elbow and also up T his jaw.  This was associated with some  shortness of breath and diaphoresis.  He came to the emergency room at [facility] for further evaluation.  Currently he is pain free.  He denies  any past history of exertional chest pain.  He had had a remote treadmill  test.   PAST MEDICAL HISTORY:  Remarkable for hypertension and insulin-dependent  diabetes.  He has had previous hernia surgery.   CURRENT MEDICATIONS:  1. Altace 5 mg a day.  2. Insulin 70/30, 50 units t.i.d.  3. Oral hypoglycemic.   ALLERGIES:  No known drug allergies.   SOCIAL HISTORY:  He is divorced.  He works for the [employer] in Maintenance.  He has two children, a 1 year old daughter and a  54-year-old son who lives with them.  He is a nonsmoker.  He quit 11 years  ago.  He has a 20-pack a year history.  He denies alcohol use.   FAMILY HISTORY:  Remarkable for father died of complications of congestive  heart failure.  He has two brothers and one sister without coronary disease.   REVIEW OF SYSTEMS:  Essentially unremarkable except as noted above.  He  denies any GI bleeding or peptic ulcer disease.  He denies any kidney  problems.   He had no thyroid disease.   PHYSICAL EXAMINATION:  VITAL SIGNS:  Blood pressure 165/79, heart rate 106,  temperature 97.6, O2 saturations 96%.  GENERAL:  He is a well-developed, obese, African American male, in no acute  distress.  HEENT:  Normocephalic, atraumatic.  Extraocular movements are intact.  Sclerae is nonicteric.  NECK:  Without jugular venous distension, without bruits.  CHEST:  Clear to auscultation on percussion.  CARDIAC:  Reveals a regular rate and rhythm, with a soft, systolic ejection  murmur at the left sternal border.  ABDOMEN:  Obese, nontender. Bowel sounds are present.  EXTREMITIES:  Without edema.  Distal pulses are intact.  NEUROLOGIC:  Exam is grossly negative.  He is awake, alert and oriented, and  cooperative.  He moves all extremities.  SKIN:  Warm is dry.   EKG:  Shows sinus tachycardia, right axis deviation.  Initial labs, white  count 8.7, hemoglobin 15.7, hematocrit 44.5.  Platelets 282,000.  D-Dimer is  0.58.  Sodium 132, potassium 3.0, albumin 10, creatinine 0.9.  Initial  troponin is negative, but his initial MB is 10.   IMPRESSION:  1. Unstable angina.  2. Rule out pulmonary embolism.  3. History of hypertension.  4. Obesity.  5. Noninsulin dependent diabetes.  6. Ex-smoker.   PLAN:  1. The patient is admitted to telemetry.  2. Dr. Claiborne Billings would like to get a spiral CT to rule out pulmonary embolism as     his D-Dimer is slightly elevated, and he did have shortness of breath and     tachycardia.  3. We will start him on IV heparin and nitroglycerin and add beta-blocker     and aspirin.  4. Will need definitive catheterization.  It will be set up for tomorrow.      Erlene Quan, P.A.                      Claiborne Billings, Dr.    Meryl Dare  D:  01/23/2004  T:  01/23/2004  Job:  [id number]

## 2011-02-09 NOTE — Discharge Summary (Signed)
NAME:  Jonathon Bailey, Jonathon Bailey NO.:  [id number]   MEDICAL RECORD NO.:  [id number]          PATIENT TYPE:  INP   LOCATION:  [id number]                         FACILITY:  [facility]   PHYSICIAN:  Blane Ohara McDiarmid, M.D.DATE OF BIRTH:  December 06, 1955   DATE OF ADMISSION:  05/19/2007  DATE OF DISCHARGE:  05/21/2007                               DISCHARGE SUMMARY   PRIMARY CARE Daryle Amis:  Lina Sayre. Everlene Farrier, M.D. at [facility].   PROCEDURES:  None.   REASON FOR ADMISSION:  The patient is a 55 year old male with multiple  medical problems including hypertension, diabetes, known coronary artery  disease status post stent, hyperlipidemia and obesity who presented with  left-sided weakness and paresthesias that he discovered upon awakening  to go to the bathroom in the middle of the night.   DISCHARGE DIAGNOSIS:  Primary left sciatic neuropathy.   SECONDARY DIAGNOSES:  1. Hypertension.  2. Diabetes.  3. Coronary artery disease status post stent.  4. Hyperlipidemia.  5. Obesity.   LABORATORY DATA ON ADMISSION:  The patient had a CBC which showed white  blood cells of 8.5.  Hemoglobin 13.7, hematocrit 40.4, platelets 275,  electrolytes showed sodium of 136, potassium 3.6, chloride 103, bicarb  23, BUN 16, creatinine 1.16, glucose 363.  Total bili was 0.9, alk phos  66, AST 17, ALT 17, total protein 7.1, albumin 3.3, calcium 9.2.  Cardiac enzymes in the emergency department showed a CK of 304,  myoglobin of 5.2, troponin of 0.02.  Subsequent cardiac enzymes also  were negative with troponins of 0.04 and 0.03.  PT was found to be 12.1,  INR was found to be 0.9.  A head CT was obtained which showed no acute  findings and no changes compared to prior head CT.  It did show some  chronic sinusitis.  Fasting lipid panel was obtained which showed a  total cholesterol of 290.  Triglycerides of 525, HDL of 32, an LDL that  was unable to calculate.  A direct LDL had been obtained which was  found  to be 146.  Hemoglobin A1c was 8.4, TSH was within normal limits at  0.982.  A chest x-ray showed no cardiopulmonary disease.  MRI and MRA  was obtained which showed no evidence of acute ischemia, minimal  nonspecific subcortical white matter changes possibly representing  ischemic gliosis due to hypertension or diabetes with a less likely  possibility being a demyelinating process or a vasculitis, moderate  thickening of the mucosa left greater than right, more prominently in  the maxillary sinuses and the frontal sinuses, possible inflammatory  thickening of the mucosa in the left mastoid air cells.  MRA showed #1)  Probable arteriosclerotic changes involving the MCA left greater than  right with about 50-60% stenosis in the left MCA proximally.  Similar  changes involving the ACA, left greater than right and possibly  involving the basilar artery were also seen.  #2)  No evidence of  intracranial aneurysms were seen.  Additionally, an echocardiogram was  obtained but the results are not seen in the computer at this time.   DISCHARGE MEDICATIONS:  1. Lisinopril 40 mg p.o. b.i.d.  2. Carvedilol 6.25 mg p.o. b.i.d.  3. Simvastatin 40 mg p.o. q.h.s.  4. Lantus 35 units injected subcutaneously in the morning.  5. Fenofibrate 54 mg p.o. daily.  6. Fish oil over the counter.  Take a total of 1 gram per day.  7. Neurontin 300 mg p.o. t.i.d.   HOSPITAL COURSE:  1. Weakness.  As mentioned previously a head CT was negative for an      acute bleed or an acute stroke.  MRI or MRA showed no focal      findings that would explain the patient's symptoms.  A neurology      consult was obtained and they made a diagnosis of paresthesias      secondary to left sciatic neuropathy.  The recommended starting the      patient on Neurontin 300 mg t.i.d. and instructed him to come back      for followup for an EMG and nerve conduction study if his symptoms      did not improve or if they worsened.   Although it seems at this      time that the patient probably did not have a stroke, we did feel      that he was at high risk for a future event especially given the      MRI and MRA findings of stenosis and the patient's high lipids.  He      was discharged on 40 mg of Zocor daily.  Our thought was that this      would probably need to be titrated up but 40 mg is the recommended      starting dose.  We also discharged him on fenofibrate 54 mg.      Again, this may need to be titrated and then we discharged him also      on fish oil over the counter which he was supposed to be taking      previously.  We also continued the patient on his lisinopril and      Coreg and placed him on aspirin.  2. Coronary artery disease and hypertension.  The patient has a      history of noncompliance with his medicines.  We restarted him on      his home medicines and tried to educate the patient about why these      were important.  It does seem that some of his medicines he was not      secondary to cost so we changed his Lipitor to Zocor in hopes that      the patient would fill these prescriptions.  The patient probably      does eventually need to be started on hydrochlorothiazide but we      will leave that up to the primary care physician.  We told the      patient to stop taking his Lasix and his Micardis and to discuss      this with Dr. Everlene Farrier.  3. Diabetes.  The patient reported that he was taking 50 units of      NovoLog t.i.d.  After speaking with the pharmacist we decided that      Lantus would be perhaps a better option for him to try that would      be easier and the cost would not be any more than his NovoLog.  We      started the patient on 35 units in  the morning.  Again, this will      probably need to be titrated up per his primary care physician,      depending on his CBGs.  4. Hyperlipidemia.  This was covered above.  Started patient on Zocor      but that will probably need to be  titrated up.  Fish oil and      fenofibrate were also included in his regimen upon discharge.   PENDING TEST RESULTS AT THE TIME OF DISCHARGE:  An echocardiogram was  obtained but the results are not available in the computer at this time.  We will try to call for results.   DISPOSITION:  The patient was discharged home in stable condition.   DISCHARGE FOLLOWUP:  The patient was instructed to followup with Dr.  Everlene Farrier at [facility] on September 2 at 11:45.   FOLLOWUP ISSUES:  1. The results of the echocardiogram are pending.  We will try to find      those results and send a copy to Dr. Everlene Farrier.  2. Blood pressure medicines were changed but the patient still may      need higher dosages and better control.  This can be followed up      with Dr. Everlene Farrier.  3. Diabetes medications were also changed and some of these may need      to be titrated up as well.  4. When patient was seen by Dr. Doy Mince in neurology and diagnosed      with left sciatic neuropathy, if the patient is not better he was      instructed to call Dr. Doy Mince for a followup appointment and      possible EMG and nerve      conduction study.  Again, the patient was told to stop taking his      70/30 insulin, his Lasix and his Micardis and Dr. Everlene Farrier can followup      the need to reinstitute these medicines.  The patient was set up      for outpatient physical therapy in order to help rehabilitate his      weakness.      Carin Hock, MD  Electronically Signed      Blane Ohara McDiarmid, M.D.  Electronically Signed    SO/MEDQ  D:  06/10/2007  T:  06/11/2007  Job:  [id number]   cc:   [name] A. Everlene Farrier, M.D.

## 2011-02-09 NOTE — Discharge Summary (Signed)
Jonathon Bailey, Jonathon Bailey                       ACCOUNT NO.:  [id number]   MEDICAL RECORD NO.:  [id number]                   PATIENT TYPE:  INP   LOCATION:  6522                                 FACILITY:  [facility]   PHYSICIAN:  Shelva Majestic, M.D.                  DATE OF BIRTH:  1955/09/25   DATE OF ADMISSION:  01/23/2004  DATE OF DISCHARGE:  01/29/2004                                 DISCHARGE SUMMARY   DISCHARGE DIAGNOSES:  1. Diaphragmatic myocardial infarction, this admission, treated with right     coronary artery CYPHER stenting and staged ramus intermedius stenting.  2. Insulin-dependent diabetes.  3. Dyslipidemia.  4. Treated hypertension.  5. Obesity.   HOSPITAL COURSE:  The patient is a 55 year old male with no prior history of  coronary disease, who presented to the emergency room with chest pain.  He  was awakened at 4 a.m. with chest pain that radiated to his jaw and down  both arms.  He was seen by Dr. Shelva Majestic and myself.  He was started on  IV heparin and nitroglycerin, beta blocker and aspirin.  A spiral CT was  obtained to rule out pulmonary embolism.  This was negative.  He did rule in  for an MI with enzymes peaking at 554 and 40 MBs.  He was taken to the cath  lab, Jan 24, 2004.  He had a totalled RCA that was dilated and stented with a  CYPHER stent.  He has residual disease in a ramus intermedius that is fairly  large.  His LV function was normal with an EF of 55%.  His medications were  adjusted for hyperlipidemia as well as hypertension.  Dr. Parke Poisson. [place]  assisted with control of his diabetes.  We feel he is stable for discharge  on Jan 29, 2004.  He had his ramus intermedius branches dilated and CYPHER-  stented on Jan 28, 2004 with good result.  We will see him back in the office  in about 2 weeks.   DISCHARGE MEDICATIONS:  1. Plavix 75 mg a day.  2. Aspirin 81 mg a day.  3. Lipitor 40 mg a day.  4. Tricor 145 mg a day.  5. Altace 10 mg a day.  6. Imdur 60 mg a day.  7. Actos 30 mg a day.  8. Toprol-XL 150 mg a day.  9. Lantus insulin 40 units nightly.  10.      Regular insulin 12 units at mealtime.  11.      Nitroglycerin sublingual p.r.n.   LABORATORY:  Sodium 138, potassium 3.7.  White count 8.2, hemoglobin 12.4,  hematocrit 37.2, platelets 264,000.  Lipid profile shows a cholesterol of  338, triglycerides 2088, HDL 24, LDL not calculated.  TSH 1.20.   Chest x-ray shows no active disease.   EKG shows sinus rhythm with Q wave in V2.   DISPOSITION:  The patient is  discharged in stable condition and will follow  up with Dr. [name] A. Daub and Dr. Claiborne Billings and Dr. Electa Sniff as needed.      Erlene Quan, P.A.                      Shelva Majestic, M.D.    Meryl Dare  D:  01/29/2004  T:  01/31/2004  Job:  [id number]   cc:   Shelva Majestic, M.D.  [phone] [address], [address] [place], [place] [address]  Fax: [place] Everlene Farrier, M.D.  [place]  [place] [address]  Fax: [place]. Electa Sniff, M.D.  [id number] [address] [place]  [place] [address]  Fax: [phone]

## 2011-03-29 ENCOUNTER — Encounter: Payer: Self-pay | Admitting: Family Medicine

## 2011-03-29 ENCOUNTER — Ambulatory Visit (INDEPENDENT_AMBULATORY_CARE_PROVIDER_SITE_OTHER): Payer: Medicaid Other | Admitting: Family Medicine

## 2011-03-29 DIAGNOSIS — J3489 Other specified disorders of nose and nasal sinuses: Secondary | ICD-10-CM

## 2011-03-29 DIAGNOSIS — J40 Bronchitis, not specified as acute or chronic: Secondary | ICD-10-CM

## 2011-03-29 DIAGNOSIS — J209 Acute bronchitis, unspecified: Secondary | ICD-10-CM

## 2011-03-29 MED ORDER — PREDNISONE (PAK) 10 MG PO TABS: 10.0000 mg | ORAL_TABLET | Freq: Every day | ORAL | Status: AC

## 2011-03-29 MED ORDER — ACETAMINOPHEN-CODEINE 300-30 MG PO TABS: 1.0000 | ORAL_TABLET | Freq: Four times a day (QID) | ORAL | Status: DC | PRN

## 2011-03-29 MED ORDER — ALBUTEROL SULFATE HFA 108 (90 BASE) MCG/ACT IN AERS: 2.0000 | INHALATION_SPRAY | RESPIRATORY_TRACT | Status: DC | PRN

## 2011-03-29 MED ORDER — AZITHROMYCIN 500 MG PO TABS: 500.0000 mg | ORAL_TABLET | Freq: Every day | ORAL | Status: AC

## 2011-03-29 MED ORDER — ALBUTEROL SULFATE (2.5 MG/3ML) 0.083% IN NEBU
2.5000 mg | INHALATION_SOLUTION | Freq: Once | RESPIRATORY_TRACT | Status: AC
  Administered 2011-03-29: 2.5 mg via RESPIRATORY_TRACT

## 2011-03-29 MED ORDER — IPRATROPIUM BROMIDE 0.02 % IN SOLN
0.5000 mg | Freq: Once | RESPIRATORY_TRACT | Status: AC
  Administered 2011-03-29: 0.5 mg via RESPIRATORY_TRACT

## 2011-03-29 MED ORDER — BENZONATATE 200 MG PO CAPS: 200.0000 mg | ORAL_CAPSULE | Freq: Three times a day (TID) | ORAL | Status: AC | PRN

## 2011-03-29 NOTE — Patient Instructions (Addendum)
It was nice to meet you. It appears that you have bronchitis.  Please pick up antibiotic and cough medicine at your pharmacy. You may use Albuterol inhaler as needed for shortness of breath/wheezing. The prednisone should also help improve your breathing. Please return to clinic to follow up with PCP, Dr. Tamala Julian. If your symptoms become worse, you have difficulty breathing, please call MD or return to clinic. Thank you.

## 2011-03-30 ENCOUNTER — Encounter: Payer: Self-pay | Admitting: Family Medicine

## 2011-03-30 DIAGNOSIS — J209 Acute bronchitis, unspecified: Secondary | ICD-10-CM | POA: Insufficient documentation

## 2011-03-30 NOTE — Progress Notes (Signed)
  Subjective:    Patient ID: Jonathon Bailey, male    DOB: 1955/10/11, 55 y.o.   MRN: [id number]  HPI  Cough: chronic issue, has been going on for a few years now.  Patient has a hx of CHF, OSA, bronchitis.  Cough is productive and associated with SOB and audible wheezing.  Has been taking Mucinex, but no relief.  Patient says this is his typical bronchitis presentation.  Wants an antibiotic and medication to relieve coughing.  Denies chest pain, fever, chills, diaphoresis.  Denies abdominal pain.  Weight today is 311 which appears to be at baseline.  Endorses SOB and wheezing.  Congestion: started 1 week ago.  Denies rhinorrhea, sinus pressure, headache.  Patient says congestion is coming from lungs and is causing dyspnea/SOB.    Review of Systems  Per HPI    Objective:   Physical Exam  Constitutional: He appears well-developed and well-nourished.       Morbidly obese  HENT:  Head: Normocephalic and atraumatic.  Mouth/Throat: No oropharyngeal exudate.  Neck: Normal range of motion. Neck supple. No JVD present.  Cardiovascular: Normal rate, regular rhythm and intact distal pulses.  Exam reveals no gallop and no friction rub.   No murmur heard.      Distant heart sounds   Pulmonary/Chest: No accessory muscle usage. Tachypnea noted. No respiratory distress. He has no decreased breath sounds. He has wheezes.       Poor air movement, coarse breath sounds, audible wheezing  Abdominal: Soft. Bowel sounds are normal. He exhibits no distension. There is no tenderness.  Musculoskeletal: Normal range of motion.       2 + pitting edema bil  Lymphadenopathy:    He has no cervical adenopathy.  Skin: Skin is warm and dry.          Assessment & Plan:

## 2011-03-30 NOTE — Assessment & Plan Note (Signed)
Cough/congestion and SOB likely secondary to bronchospasm vs. Bronchitis.  Will give Azithromycin 500 mg PO daily x 5 days and Tessalon perles for cough. Will give Albuterol inhaler as needed for shortness of breath/wheezing. Prednisone (optional) if patient's symptoms do not improve in 1-2 days.  May need to increase insulin while on Prednisone. Patient to return to clinic to follow up with PCP, Dr. Tamala Julian. Red flags reviewed.  Patient agreed with plan.

## 2011-04-05 ENCOUNTER — Encounter: Payer: Self-pay | Admitting: Family Medicine

## 2011-04-05 ENCOUNTER — Ambulatory Visit (INDEPENDENT_AMBULATORY_CARE_PROVIDER_SITE_OTHER): Payer: Medicaid Other | Admitting: Family Medicine

## 2011-04-05 DIAGNOSIS — I5022 Chronic systolic (congestive) heart failure: Secondary | ICD-10-CM

## 2011-04-05 DIAGNOSIS — R05 Cough: Secondary | ICD-10-CM

## 2011-04-05 DIAGNOSIS — N289 Disorder of kidney and ureter, unspecified: Secondary | ICD-10-CM

## 2011-04-05 DIAGNOSIS — F528 Other sexual dysfunction not due to a substance or known physiological condition: Secondary | ICD-10-CM

## 2011-04-05 DIAGNOSIS — I10 Essential (primary) hypertension: Secondary | ICD-10-CM

## 2011-04-05 DIAGNOSIS — E119 Type 2 diabetes mellitus without complications: Secondary | ICD-10-CM

## 2011-04-05 MED ORDER — LOSARTAN POTASSIUM 100 MG PO TABS: 100.0000 mg | ORAL_TABLET | Freq: Every day | ORAL | Status: DC

## 2011-04-05 MED ORDER — CHLORPHENIRAMINE-CODEINE 0.267-1 MG/ML PO SUSP: 3.0000 mL | Freq: Three times a day (TID) | ORAL | Status: DC | PRN

## 2011-04-05 NOTE — Patient Instructions (Signed)
Nice to meet you I am giving you a cough syrup to try the next 5 days but then you should not need it anymore. I will get labs for your kidneys I want to see you again in 3 months for sure Keep your other appointments with your heart doctor and asked them about the viagra.

## 2011-04-05 NOTE — Assessment & Plan Note (Signed)
Will check BMEt last creat very high, will see deterding on 22nd.

## 2011-04-05 NOTE — Assessment & Plan Note (Signed)
Will change his lisinopril and will give cough syrup.

## 2011-04-06 LAB — BASIC METABOLIC PANEL
BUN: 84 mg/dL — ABNORMAL HIGH (ref 6–23)
Chloride: 95 mEq/L — ABNORMAL LOW (ref 96–112)
Glucose, Bld: 292 mg/dL — ABNORMAL HIGH (ref 70–99)
Potassium: 4 mEq/L (ref 3.5–5.3)

## 2011-04-06 NOTE — Assessment & Plan Note (Signed)
Not at goal, encouraged pt to take medications as prescribe, pt even states if he takes his lantus as prescribed his blood sugars are around 120.  Told him need to get control otherwise will have end organ failure like his kidneys.

## 2011-04-06 NOTE — Assessment & Plan Note (Signed)
Told pt he needs to discuss with cardiologist first if they ok it then would consider giving viagra but would like cardiologist recs.

## 2011-04-06 NOTE — Progress Notes (Signed)
  Subjective:    Patient ID: Jonathon Bailey, male    DOB: [birth year], 55 y.o.   MRN: [id number]  HPI Pt is here for follow up for his bronchitis.  Pt has been coughing still for sometime and does not know if it was related to the illness, he states he still has a cough nonproductive, no fever, dry cough.  Pt is on lisinopril.  No nausea vomiting or dizziness with cough.   Pt has not been back for other chronic problems so we will discuss.    Diabetes:  High at home: does not check.  Low at home:does not check Taking medications:Sometimes but not all the times and really does not want to do sliding scale.  Side effects:no ROS: denies fever, chills, dizziness, loss of conscieness, polyuria poly dipsia numbness or tingling in extremities or chest pain.  Hypertension Blood pressure at home:does not check Blood pressure today: 105/68 Taking Meds:yes including his lisinopril Side effects:maybe cough? ROS: Denies headache visual changes nausea, vomiting, chest pain or abdominal pain or shortness of breath.  CKD-  Pt states he is still making urine as always, is going to see his nephrologist Deterding later this month.  Has not had blood values taken for many months.   Lab Results  Component Value Date   CREATININE 3.69* 04/05/2011    CHF s/pstenting for inferior wall MI.  See cardiologist later this month.  On cholesterol med, ace and beta blocker. No chest pain or SOB with activity or at rest. Pt though is complaining of some erectile dysfunction but has not spoke to his cardiologist about this. Wants viagra.     Review of Systems See above.     Objective:   Physical Exam Physical Exam  Constitutional: He is oriented to person, place, and time. Vital signs are normal. He appears well-developed and well-nourished. He is cooperative.  HENT:  Head: Normocephalic and atraumatic.  Right Ear: Tympanic membrane normal.  Left Ear: Tympanic membrane normal.  Nose: Rhinorrhea present.    Mouth/Throat: Oropharynx is clear and moist and mucous membranes are normal.  Neck: No JVD present.  Cardiovascular: S1 normal, S2 normal and normal pulses.   Murmur heard.  Systolic murmur is present with a grade of 2/6      Actual +1 edema to calf bilaterally Pulmonary/Chest: Effort normal. He has decreased breath sounds. He has no wheezes. symmetric  Abdominal: Soft. Bowel sounds are normal.  Neurological: He is alert and oriented to person, place, and time.          Assessment & Plan:

## 2011-04-06 NOTE — Assessment & Plan Note (Signed)
Pt is seeing his cardiologist later this month, may need another stress test at this time, pt is not relizing the importance of control of his chronic diseases.

## 2011-04-06 NOTE — Assessment & Plan Note (Signed)
At goal, will change lisinopril though to ARB to see if help with cough. Will see again in 1-3 months to see how he is doing.

## 2011-04-17 ENCOUNTER — Ambulatory Visit: Payer: Medicaid Other | Admitting: Family Medicine

## 2011-05-11 ENCOUNTER — Encounter (HOSPITAL_BASED_OUTPATIENT_CLINIC_OR_DEPARTMENT_OTHER): Payer: Medicaid Other

## 2011-06-13 LAB — BASIC METABOLIC PANEL
Calcium: 9.5
Chloride: 102
Creatinine, Ser: 1.13
GFR calc Af Amer: >60
Sodium: 137

## 2011-06-13 LAB — I-STAT 8, (EC8 V) (CONVERTED LAB)
Acid-Base Excess: 1
Bicarbonate: 28.4 — ABNORMAL HIGH
Potassium: 4.2
TCO2: 30
pCO2, Ven: 53.8 — ABNORMAL HIGH
pH, Ven: 7.331 — ABNORMAL HIGH

## 2011-06-13 LAB — CBC
HCT: 35.6 — ABNORMAL LOW
HCT: 37.6 — ABNORMAL LOW
Hemoglobin: 11.8 — ABNORMAL LOW
Hemoglobin: 12.4 — ABNORMAL LOW
Hemoglobin: 12.6 — ABNORMAL LOW
MCHC: 33.1
MCV: 85
Platelets: 274
Platelets: 279
RBC: 4.42
RDW: 12.8
WBC: 7.8
WBC: 8.3

## 2011-06-13 LAB — COMPREHENSIVE METABOLIC PANEL
Albumin: 2.8 — ABNORMAL LOW
Alkaline Phosphatase: 56
BUN: 8
Creatinine, Ser: 1.02
Glucose, Bld: 183 — ABNORMAL HIGH
Potassium: 3.8
Total Bilirubin: 0.3
Total Protein: 7

## 2011-06-13 LAB — CARDIAC PANEL(CRET KIN+CKTOT+MB+TROPI)
CK, MB: 3.3
Relative Index: 1.7
Total CK: 198

## 2011-06-13 LAB — URINALYSIS, ROUTINE W REFLEX MICROSCOPIC
Glucose, UA: >1000 — AB
Ketones, ur: NEGATIVE
Protein, ur: >300 — AB

## 2011-06-13 LAB — DIFFERENTIAL
Eosinophils Relative: 5
Lymphocytes Relative: 33
Lymphs Abs: 2.6
Monocytes Absolute: 0.7

## 2011-06-13 LAB — D-DIMER, QUANTITATIVE: D-Dimer, Quant: 0.6 — ABNORMAL HIGH

## 2011-06-13 LAB — HEMOGLOBIN A1C
Hgb A1c MFr Bld: 10.1 — ABNORMAL HIGH
Mean Plasma Glucose: 282

## 2011-06-13 LAB — B-NATRIURETIC PEPTIDE (CONVERTED LAB): Pro B Natriuretic peptide (BNP): 68

## 2011-06-13 LAB — POCT CARDIAC MARKERS
CKMB, poc: 5.7
Myoglobin, poc: 178
Troponin i, poc: <0.05

## 2011-06-13 LAB — URINE MICROSCOPIC-ADD ON

## 2011-06-15 ENCOUNTER — Encounter (HOSPITAL_BASED_OUTPATIENT_CLINIC_OR_DEPARTMENT_OTHER): Payer: Medicaid Other

## 2011-06-19 LAB — DIFFERENTIAL
Basophils Absolute: 0
Eosinophils Relative: 5
Lymphocytes Relative: 35
Neutro Abs: 3.5
Neutrophils Relative %: 49

## 2011-06-19 LAB — COMPREHENSIVE METABOLIC PANEL
BUN: 8
CO2: 27
Calcium: 8.8
Chloride: 102
Creatinine, Ser: 1.02
GFR calc non Af Amer: >60
Glucose, Bld: 247 — ABNORMAL HIGH
Total Bilirubin: 0.4

## 2011-06-19 LAB — CBC
HCT: 39.6
MCHC: 33.4
MCV: 85.2
RBC: 4.65
WBC: 7.2

## 2011-06-29 LAB — POCT I-STAT, CHEM 8
BUN: 10 mg/dL (ref 6–23)
Calcium, Ion: 1.2 mmol/L (ref 1.12–1.32)
Chloride: 106 mEq/L (ref 96–112)
Creatinine, Ser: 1.3 mg/dL (ref 0.4–1.5)
Glucose, Bld: 273 mg/dL — ABNORMAL HIGH (ref 70–99)
HCT: 42 % (ref 39.0–52.0)
Hemoglobin: 14.3 g/dL (ref 13.0–17.0)
Potassium: 4.2 mEq/L (ref 3.5–5.1)
Sodium: 140 mEq/L (ref 135–145)
TCO2: 27 mmol/L (ref 0–100)

## 2011-06-29 LAB — CARDIAC PANEL(CRET KIN+CKTOT+MB+TROPI)
CK, MB: 5.7 ng/mL — ABNORMAL HIGH (ref 0.3–4.0)
CK, MB: 5.8 ng/mL — ABNORMAL HIGH (ref 0.3–4.0)
Relative Index: 2.6 — ABNORMAL HIGH (ref 0.0–2.5)
Total CK: 220 U/L (ref 7–232)
Troponin I: 0.03 ng/mL (ref 0.00–0.06)

## 2011-06-29 LAB — BASIC METABOLIC PANEL
CO2: 28 mEq/L (ref 19–32)
Calcium: 8.5 mg/dL (ref 8.4–10.5)
Creatinine, Ser: 1.32 mg/dL (ref 0.4–1.5)
Glucose, Bld: 255 mg/dL — ABNORMAL HIGH (ref 70–99)

## 2011-06-29 LAB — GLUCOSE, CAPILLARY
Glucose-Capillary: 214 mg/dL — ABNORMAL HIGH (ref 70–99)
Glucose-Capillary: 252 mg/dL — ABNORMAL HIGH (ref 70–99)
Glucose-Capillary: 258 mg/dL — ABNORMAL HIGH (ref 70–99)
Glucose-Capillary: 345 mg/dL — ABNORMAL HIGH (ref 70–99)

## 2011-06-29 LAB — DIFFERENTIAL
Basophils Absolute: 0.1 10*3/uL (ref 0.0–0.1)
Basophils Relative: 2 % — ABNORMAL HIGH (ref 0–1)
Eosinophils Absolute: 0.3 10*3/uL (ref 0.0–0.7)
Eosinophils Relative: 4 % (ref 0–5)
Lymphocytes Relative: 35 % (ref 12–46)
Lymphs Abs: 3.1 10*3/uL (ref 0.7–4.0)
Monocytes Absolute: 0.6 10*3/uL (ref 0.1–1.0)
Monocytes Relative: 7 % (ref 3–12)
Neutro Abs: 4.6 10*3/uL (ref 1.7–7.7)
Neutrophils Relative %: 53 % (ref 43–77)

## 2011-06-29 LAB — COMPREHENSIVE METABOLIC PANEL
ALT: 14 U/L (ref 0–53)
AST: 19 U/L (ref 0–37)
Albumin: 2.4 g/dL — ABNORMAL LOW (ref 3.5–5.2)
Alkaline Phosphatase: 74 U/L (ref 39–117)
BUN: 11 mg/dL (ref 6–23)
CO2: 26 mEq/L (ref 19–32)
Calcium: 8 mg/dL — ABNORMAL LOW (ref 8.4–10.5)
Chloride: 102 mEq/L (ref 96–112)
Creatinine, Ser: 1.24 mg/dL (ref 0.4–1.5)
GFR calc Af Amer: >60 mL/min (ref >60)
GFR calc non Af Amer: >60 mL/min (ref >60)
Glucose, Bld: 336 mg/dL — ABNORMAL HIGH (ref 70–99)
Potassium: 4 mEq/L (ref 3.5–5.1)
Sodium: 132 mEq/L — ABNORMAL LOW (ref 135–145)
Total Bilirubin: 0.7 mg/dL (ref 0.3–1.2)
Total Protein: 6.4 g/dL (ref 6.0–8.3)

## 2011-06-29 LAB — CBC
HCT: 40 % (ref 39.0–52.0)
Hemoglobin: 12.3 g/dL — ABNORMAL LOW (ref 13.0–17.0)
MCHC: 32.8 g/dL (ref 30.0–36.0)
MCV: 86.3 fL (ref 78.0–100.0)
MCV: 87 fL (ref 78.0–100.0)
Platelets: 268 10*3/uL (ref 150–400)
RBC: 4.35 MIL/uL (ref 4.22–5.81)
RDW: 14 % (ref 11.5–15.5)

## 2011-06-29 LAB — POCT CARDIAC MARKERS
Myoglobin, poc: 210 ng/mL (ref 12–200)
Troponin i, poc: <0.05 ng/mL (ref 0.00–0.09)

## 2011-06-29 LAB — HEMOGLOBIN A1C: Hgb A1c MFr Bld: 10.8 % — ABNORMAL HIGH (ref 4.6–6.1)

## 2011-06-29 LAB — TROPONIN I: Troponin I: 0.05 ng/mL (ref 0.00–0.06)

## 2011-06-29 LAB — CK TOTAL AND CKMB (NOT AT ARMC): Total CK: 217 U/L (ref 7–232)

## 2011-06-29 LAB — B-NATRIURETIC PEPTIDE (CONVERTED LAB): Pro B Natriuretic peptide (BNP): 279 pg/mL — ABNORMAL HIGH (ref 0.0–100.0)

## 2011-07-06 LAB — HEMOGLOBIN A1C
Hgb A1c MFr Bld: 8.4 — ABNORMAL HIGH
Mean Plasma Glucose: 222
Mean Plasma Glucose: 222

## 2011-07-06 LAB — PROTIME-INR: Prothrombin Time: 12.1

## 2011-07-06 LAB — CBC
Hemoglobin: 12.6 — ABNORMAL LOW
Hemoglobin: 13.7
MCHC: 33.8
MCHC: 34
MCV: 85.7
Platelets: 243
Platelets: 252
Platelets: 275
RBC: 4.35
RDW: 12.6
RDW: 12.8

## 2011-07-06 LAB — COMPREHENSIVE METABOLIC PANEL
Alkaline Phosphatase: 66
BUN: 16
Creatinine, Ser: 1.16
Glucose, Bld: 363 — ABNORMAL HIGH
Potassium: 3.6
Total Bilirubin: 0.9
Total Protein: 7.1

## 2011-07-06 LAB — BASIC METABOLIC PANEL
BUN: 11
CO2: 28
Calcium: 9.3
Chloride: 104
Creatinine, Ser: 0.89
Creatinine, Ser: 0.94
GFR calc non Af Amer: >60
Glucose, Bld: 205 — ABNORMAL HIGH
Sodium: 138

## 2011-07-06 LAB — CARDIAC PANEL(CRET KIN+CKTOT+MB+TROPI)
CK, MB: 3.3
Total CK: 181
Total CK: 239 — ABNORMAL HIGH
Troponin I: 0.03

## 2011-07-06 LAB — DIFFERENTIAL
Basophils Absolute: 0
Basophils Relative: 1
Monocytes Relative: 7
Neutro Abs: 4.5
Neutrophils Relative %: 53

## 2011-07-06 LAB — LIPID PANEL
Cholesterol: 290 — ABNORMAL HIGH
Total CHOL/HDL Ratio: 9.1

## 2011-07-06 LAB — LDL CHOLESTEROL, DIRECT: Direct LDL: 146 — ABNORMAL HIGH

## 2011-07-06 LAB — CK TOTAL AND CKMB (NOT AT ARMC): CK, MB: 5.2 — ABNORMAL HIGH

## 2011-11-07 ENCOUNTER — Ambulatory Visit (HOSPITAL_COMMUNITY)
Admission: RE | Admit: 2011-11-07 | Discharge: 2011-11-07 | Disposition: A | Discharge status: Home / Self Care | Payer: Medicare Other | Source: Ambulatory Visit | Attending: Family Medicine | Admitting: Family Medicine

## 2011-11-07 ENCOUNTER — Encounter: Payer: Self-pay | Admitting: Family Medicine

## 2011-11-07 ENCOUNTER — Ambulatory Visit (INDEPENDENT_AMBULATORY_CARE_PROVIDER_SITE_OTHER): Payer: Medicare Other | Admitting: Family Medicine

## 2011-11-07 DIAGNOSIS — R05 Cough: Secondary | ICD-10-CM | POA: Insufficient documentation

## 2011-11-07 DIAGNOSIS — R0602 Shortness of breath: Secondary | ICD-10-CM

## 2011-11-07 DIAGNOSIS — IMO0001 Reserved for inherently not codable concepts without codable children: Secondary | ICD-10-CM

## 2011-11-07 DIAGNOSIS — J13 Pneumonia due to Streptococcus pneumoniae: Secondary | ICD-10-CM

## 2011-11-07 DIAGNOSIS — E785 Hyperlipidemia, unspecified: Secondary | ICD-10-CM

## 2011-11-07 DIAGNOSIS — R059 Cough, unspecified: Secondary | ICD-10-CM | POA: Insufficient documentation

## 2011-11-07 DIAGNOSIS — I5022 Chronic systolic (congestive) heart failure: Secondary | ICD-10-CM

## 2011-11-07 DIAGNOSIS — I10 Essential (primary) hypertension: Secondary | ICD-10-CM

## 2011-11-07 LAB — CBC WITH DIFFERENTIAL/PLATELET
Basophils Relative: 0 % (ref 0–1)
Eosinophils Absolute: 0.5 10*3/uL (ref 0.0–0.7)
HCT: 29.1 % — ABNORMAL LOW (ref 39.0–52.0)
Hemoglobin: 8.9 g/dL — ABNORMAL LOW (ref 13.0–17.0)
Lymphs Abs: 3.5 10*3/uL (ref 0.7–4.0)
MCH: 27.4 pg (ref 26.0–34.0)
MCHC: 30.6 g/dL (ref 30.0–36.0)
MCV: 89.5 fL (ref 78.0–100.0)
Monocytes Absolute: 1.1 10*3/uL — ABNORMAL HIGH (ref 0.1–1.0)
Monocytes Relative: 9 % (ref 3–12)
RBC: 3.25 MIL/uL — ABNORMAL LOW (ref 4.22–5.81)

## 2011-11-07 LAB — COMPREHENSIVE METABOLIC PANEL
ALT: 14 U/L (ref 0–53)
AST: 16 U/L (ref 0–37)
Alkaline Phosphatase: 70 U/L (ref 39–117)
CO2: 23 mEq/L (ref 19–32)
Sodium: 140 mEq/L (ref 135–145)
Total Bilirubin: 0.4 mg/dL (ref 0.3–1.2)
Total Protein: 6.4 g/dL (ref 6.0–8.3)

## 2011-11-07 LAB — POCT GLYCOSYLATED HEMOGLOBIN (HGB A1C): Hemoglobin A1C: 9.7

## 2011-11-07 MED ORDER — FUROSEMIDE 10 MG/ML IJ SOLN
40.0000 mg | Freq: Once | INTRAMUSCULAR | Status: AC
  Administered 2011-11-07: 40 mg via INTRAMUSCULAR

## 2011-11-07 MED ORDER — CEFTRIAXONE SODIUM 1 G IJ SOLR
1.0000 g | Freq: Once | INTRAMUSCULAR | Status: AC
  Administered 2011-11-07: 1 g via INTRAMUSCULAR

## 2011-11-07 MED ORDER — FUROSEMIDE 20 MG PO TABS
60.0000 mg | ORAL_TABLET | Freq: Once | ORAL | Status: AC
  Administered 2011-11-07: 60 mg via ORAL

## 2011-11-07 MED ORDER — IPRATROPIUM BROMIDE 0.02 % IN SOLN
0.5000 mg | Freq: Once | RESPIRATORY_TRACT | Status: AC
  Administered 2011-11-07: 0.5 mg via RESPIRATORY_TRACT

## 2011-11-07 MED ORDER — ALBUTEROL SULFATE (5 MG/ML) 0.5% IN NEBU
2.5000 mg | INHALATION_SOLUTION | Freq: Once | RESPIRATORY_TRACT | Status: AC
  Administered 2011-11-07: 2.5 mg via RESPIRATORY_TRACT

## 2011-11-07 NOTE — Assessment & Plan Note (Signed)
Patient is hypertensive today but not any of his medications. Patient is on a lot of medications and hopefully once he diuresis this will improve. We'll have patient come back in one day and see if he improves.

## 2011-11-07 NOTE — Patient Instructions (Addendum)
We will get labs on you. I think he may have a pneumonia versus having too much fluid on your heart. I want to make sure you're getting better so please come back at followup appointment in 2 days. I can refill all your medications very important for you to take it torsemide in for the next 2 days I would take 150 mg twice a day. If for some reason you feel much worse get more shortness of breath or have chest pain please come back or go to emergency department. I will see you on Friday.

## 2011-11-07 NOTE — Progress Notes (Signed)
  Subjective:    Patient ID: Jonathon Bailey, male    DOB: [birth year], 56 y.o.   MRN: [id number]  HPI  56 year old male who has not been seen in clinic for quite some time coming in with acute onset of cough congestion fever x1 week. Patient states he has been very short of breath having trouble with the been regular activities of daily living due to being tired. Patient states he has had recent travel history where he was up in [place] for some time but has been back for the last 2 weeks. Patient states he has had significant amount of swelling in his lower extremities as well but this is likely due to him not taking any of his medications. When asked why he stopped his medications he stated because he felt like it. Patient states she's had a fever been very cold and chills. Patient does have a cough nonproductive Patient denies any type of chest pain any abdominal pain any diarrhea constipation nausea or vomiting.  Review of Systems As stated above in history of present illness    Objective:   Physical Exam Vitals reviewed Constitutional: He is oriented to person, place, and time. Vital signs are normal. He appears well-developed and well-nourished. He is cooperative.  HENT:  Right Ear: Tympanic membrane normal.  Left Ear: Tympanic membrane normal.  Nose: Rhinorrhea present.  Mouth/Throat: Oropharynx is clear and moist and mucous membranes are normal.  Neck: No JVD present.  Cardiovascular: S1 normal, S2 normal and normal pulses.  Murmur heard. Systolic murmur is present with a grade of 2/6      Actual +2 edema to calf bilaterally Pulmonary/Chest: Patient has expiratory wheezes throughout no focal findings patient does have crackles bibasilar. Abdominal: Soft. Bowel sounds are normal.  Neurological: He is alert and oriented to person, place, and time.     Assessment & Plan:

## 2011-11-07 NOTE — Assessment & Plan Note (Signed)
We'll give patient's A1c is likely elevated patient does not take his Lantus on a regular basis. We'll discuss further with patient.

## 2011-11-07 NOTE — Assessment & Plan Note (Signed)
Patient shortness of breath has a very significant differential. Patient does have a history congestive heart failure as well as pneumonia in the past. I do feel that patient is fluid overloaded given Lasix 100 mg total today. In addition this patient was given a breathing treatment which did improve the patient's wheezing. Patient pulse ox went from 89% up to 93% patient declined admission today. Patient edition this will be treated for any acquired pneumonia with ceftriaxone and azithromycin. Patient is to restart all his medications. Patient will return in 2 days for followup to make sure that he continues to diuresis and continues to improve. Patient knows to go to emergency department if he gets any chest pain worsening shortness of breath or worsening fevers.

## 2011-11-09 ENCOUNTER — Encounter: Payer: Self-pay | Admitting: Family Medicine

## 2011-11-09 ENCOUNTER — Ambulatory Visit (INDEPENDENT_AMBULATORY_CARE_PROVIDER_SITE_OTHER): Payer: Medicare Other | Admitting: Family Medicine

## 2011-11-09 DIAGNOSIS — I5022 Chronic systolic (congestive) heart failure: Secondary | ICD-10-CM

## 2011-11-09 DIAGNOSIS — R0602 Shortness of breath: Secondary | ICD-10-CM

## 2011-11-09 DIAGNOSIS — E785 Hyperlipidemia, unspecified: Secondary | ICD-10-CM

## 2011-11-09 DIAGNOSIS — I10 Essential (primary) hypertension: Secondary | ICD-10-CM

## 2011-11-09 DIAGNOSIS — N289 Disorder of kidney and ureter, unspecified: Secondary | ICD-10-CM

## 2011-11-09 LAB — BASIC METABOLIC PANEL WITH GFR
BUN: 35 mg/dL — ABNORMAL HIGH (ref 6–23)
CO2: 25 meq/L (ref 19–32)
Calcium: 8.3 mg/dL — ABNORMAL LOW (ref 8.4–10.5)
Chloride: 102 meq/L (ref 96–112)
Creat: 3.53 mg/dL — ABNORMAL HIGH (ref 0.50–1.35)
Glucose, Bld: 387 mg/dL — ABNORMAL HIGH (ref 70–99)
Potassium: 4.6 meq/L (ref 3.5–5.3)
Sodium: 138 meq/L (ref 135–145)

## 2011-11-09 MED ORDER — IPRATROPIUM BROMIDE 0.02 % IN SOLN
0.5000 mg | Freq: Once | RESPIRATORY_TRACT | Status: AC
  Administered 2011-11-09: 0.5 mg via RESPIRATORY_TRACT

## 2011-11-09 MED ORDER — INSULIN GLARGINE 100 UNIT/ML ~~LOC~~ SOLN: SUBCUTANEOUS | Status: DC

## 2011-11-09 MED ORDER — BENZONATATE 100 MG PO CAPS: 100.0000 mg | ORAL_CAPSULE | Freq: Two times a day (BID) | ORAL | Status: DC | PRN

## 2011-11-09 MED ORDER — ROSUVASTATIN CALCIUM 40 MG PO TABS: 40.0000 mg | ORAL_TABLET | Freq: Every day | ORAL | Status: DC

## 2011-11-09 MED ORDER — ALBUTEROL SULFATE (5 MG/ML) 0.5% IN NEBU
2.5000 mg | INHALATION_SOLUTION | Freq: Once | RESPIRATORY_TRACT | Status: AC
  Administered 2011-11-09: 2.5 mg via RESPIRATORY_TRACT

## 2011-11-09 NOTE — Assessment & Plan Note (Signed)
Lab Results  Component Value Date   HGBA1C 9.7 11/07/2011   stable but still elevated. We will increase his Lantus to 80 units twice daily from 70 units.

## 2011-11-09 NOTE — Assessment & Plan Note (Signed)
Had an exacerbation right now but is improving. We'll increase his torsemide to 200 mg 2 times a day for 2 more days then go back to his baseline. We'll have patient followup next week to make sure weight his not getting out of control and monitor from there. Patient is on a beta blocker he is on a ARB and statin. Do not feel patient's would be able to tolerate eplerenone.

## 2011-11-09 NOTE — Progress Notes (Signed)
  Subjective:    Patient ID: Jonathon Bailey, male    DOB: 1956/07/10, 56 y.o.   MRN: [id number]  HPI   56 year old male who follows up for complaint of shortness of breath. Patient was seen previously and was found to have a CHF exacerbation. BNP was significantly elevated at 218. Patient since that time has increased his torsemide to 1-1/2 pills twice a day and he does say he had increased urine output. Wt Readings from Last 3 Encounters:  11/09/11 311 lb (141.069 kg)  11/07/11 315 lb (142.883 kg)  04/05/11 295 lb (133.811 kg)   patient has lost 4 pounds since 2 days ago. He should states he's been feeling much better and was able to sleep laying down for the first time in 9 days. Patient denies any fevers or chills states he does still have a cough but it does seem to be improving. Patient states she's having good urine output but he thinks he could be better.   Diabetes:  High at home:350 Low at home:125 Taking medications: Yes Side effects: No ROS: denies fever, chills, dizziness, loss of conscieness, polyuria poly dipsia numbness or tingling in extremities or chest pain.  On labs patient's lipid panel was still elevated. Patient has been taking Crestor 20 mg nightly has made significant strides bringing his LDL down from the high 200s to the low 100s but still more than his goal of less than 100. Patient denies any side effects to the medication.  Chronic kidney disease-patient has had this history for some time follows up with Dr. Dahlia Client next week. Patient is still having pretty good urine output did increase his last torsemide. Patient is in congestive heart failure and did have increased creatinine from his baseline. Lab Results  Component Value Date   CREATININE 3.37* 11/07/2011   patient's baseline seems to be 2.5 or so. We'll recheck today    Review of Systems  As stated above in history of present illness    Objective:   Physical Exam  Vitals  reviewed Constitutional: He is oriented to person, place, and time. Vital signs are normal. He appears well-developed and well-nourished. He is cooperative.  HENT:  Right Ear: Tympanic membrane normal.  Left Ear: Tympanic membrane normal.  Nose: Rhinorrhea present.  Mouth/Throat: Oropharynx is clear and moist and mucous membranes are normal.  Neck: No JVD present.  Cardiovascular: S1 normal, S2 normal and normal pulses.  Murmur heard. Systolic murmur is present with a grade of 2/6 still stable      Actual +2 edema to calf bilaterally but less than previously Pulmonary/Chest: Patient has minimal crackles I basilar much improved from previous visit.  Abdominal: Soft. Bowel sounds are normal.  Neurological: He is alert and oriented to person, place, and time.     Assessment & Plan:

## 2011-11-09 NOTE — Assessment & Plan Note (Signed)
Will check creatinine again.

## 2011-11-09 NOTE — Patient Instructions (Signed)
I when she to increase your torsemide to 200 mg twice daily for the weekend and go back to 100 mg daily thereafter. Increase her Lantus to 80 units twice daily Increase her Crestor to 40 mg at night. I will get some labs on you today too. I think we're doing much better I will have a followup again next week just to make sure you back to baseline.

## 2011-11-09 NOTE — Assessment & Plan Note (Signed)
Increase Crestor to 40 mg at night. Hopefully we can get his LDL under the goal of 100.

## 2011-11-12 ENCOUNTER — Emergency Department (HOSPITAL_COMMUNITY): Payer: Medicare Other

## 2011-11-12 ENCOUNTER — Inpatient Hospital Stay (HOSPITAL_COMMUNITY)
Admission: EM | Admit: 2011-11-12 | Discharge: 2011-11-14 | DRG: 292 | Disposition: A | Discharge status: Home / Self Care | Payer: Medicare Other | Attending: Family Medicine | Admitting: Family Medicine

## 2011-11-12 ENCOUNTER — Other Ambulatory Visit: Payer: Self-pay

## 2011-11-12 ENCOUNTER — Encounter (HOSPITAL_COMMUNITY): Payer: Self-pay | Admitting: Emergency Medicine

## 2011-11-12 DIAGNOSIS — E1149 Type 2 diabetes mellitus with other diabetic neurological complication: Secondary | ICD-10-CM | POA: Diagnosis present

## 2011-11-12 DIAGNOSIS — G4733 Obstructive sleep apnea (adult) (pediatric): Secondary | ICD-10-CM | POA: Diagnosis present

## 2011-11-12 DIAGNOSIS — I252 Old myocardial infarction: Secondary | ICD-10-CM

## 2011-11-12 DIAGNOSIS — E1142 Type 2 diabetes mellitus with diabetic polyneuropathy: Secondary | ICD-10-CM | POA: Diagnosis present

## 2011-11-12 DIAGNOSIS — I428 Other cardiomyopathies: Secondary | ICD-10-CM | POA: Diagnosis present

## 2011-11-12 DIAGNOSIS — Z794 Long term (current) use of insulin: Secondary | ICD-10-CM

## 2011-11-12 DIAGNOSIS — I509 Heart failure, unspecified: Secondary | ICD-10-CM | POA: Diagnosis present

## 2011-11-12 DIAGNOSIS — Z9861 Coronary angioplasty status: Secondary | ICD-10-CM

## 2011-11-12 DIAGNOSIS — Z79899 Other long term (current) drug therapy: Secondary | ICD-10-CM

## 2011-11-12 DIAGNOSIS — E785 Hyperlipidemia, unspecified: Secondary | ICD-10-CM | POA: Diagnosis present

## 2011-11-12 DIAGNOSIS — N184 Chronic kidney disease, stage 4 (severe): Secondary | ICD-10-CM | POA: Diagnosis present

## 2011-11-12 DIAGNOSIS — I2510 Atherosclerotic heart disease of native coronary artery without angina pectoris: Secondary | ICD-10-CM | POA: Diagnosis present

## 2011-11-12 DIAGNOSIS — N529 Male erectile dysfunction, unspecified: Secondary | ICD-10-CM | POA: Diagnosis present

## 2011-11-12 DIAGNOSIS — Z8673 Personal history of transient ischemic attack (TIA), and cerebral infarction without residual deficits: Secondary | ICD-10-CM

## 2011-11-12 DIAGNOSIS — I129 Hypertensive chronic kidney disease with stage 1 through stage 4 chronic kidney disease, or unspecified chronic kidney disease: Secondary | ICD-10-CM | POA: Diagnosis present

## 2011-11-12 DIAGNOSIS — I5033 Acute on chronic diastolic (congestive) heart failure: Principal | ICD-10-CM | POA: Diagnosis present

## 2011-11-12 DIAGNOSIS — Z7902 Long term (current) use of antithrombotics/antiplatelets: Secondary | ICD-10-CM

## 2011-11-12 HISTORY — DX: Unspecified osteoarthritis, unspecified site: M19.90

## 2011-11-12 HISTORY — DX: Depression, unspecified: F32.A

## 2011-11-12 HISTORY — DX: Major depressive disorder, single episode, unspecified: F32.9

## 2011-11-12 HISTORY — DX: Anxiety disorder, unspecified: F41.9

## 2011-11-12 HISTORY — DX: Sleep apnea, unspecified: G47.30

## 2011-11-12 HISTORY — DX: Headache: R51

## 2011-11-12 LAB — PRO B NATRIURETIC PEPTIDE: Pro B Natriuretic peptide (BNP): 932.4 pg/mL — ABNORMAL HIGH (ref 0–125)

## 2011-11-12 MED ORDER — ROSUVASTATIN CALCIUM 40 MG PO TABS
40.0000 mg | ORAL_TABLET | Freq: Every day | ORAL | Status: DC
  Administered 2011-11-13: 40 mg via ORAL
  Filled 2011-11-12 (×2): qty 1

## 2011-11-12 MED ORDER — SODIUM CHLORIDE 0.9 % IV SOLN: 250.0000 mL | INTRAVENOUS | Status: DC | PRN

## 2011-11-12 MED ORDER — CARVEDILOL 25 MG PO TABS
25.0000 mg | ORAL_TABLET | Freq: Two times a day (BID) | ORAL | Status: DC
  Administered 2011-11-13 – 2011-11-14 (×3): 25 mg via ORAL
  Filled 2011-11-12 (×5): qty 1

## 2011-11-12 MED ORDER — HEPARIN SODIUM (PORCINE) 5000 UNIT/ML IJ SOLN
5000.0000 [IU] | Freq: Three times a day (TID) | INTRAMUSCULAR | Status: DC
  Administered 2011-11-13 – 2011-11-14 (×4): 5000 [IU] via SUBCUTANEOUS
  Filled 2011-11-12 (×7): qty 1

## 2011-11-12 MED ORDER — ACETAMINOPHEN 325 MG PO TABS: 650.0000 mg | ORAL_TABLET | ORAL | Status: DC | PRN

## 2011-11-12 MED ORDER — SODIUM CHLORIDE 0.9 % IJ SOLN
3.0000 mL | Freq: Two times a day (BID) | INTRAMUSCULAR | Status: DC
  Administered 2011-11-13 – 2011-11-14 (×3): 3 mL via INTRAVENOUS

## 2011-11-12 MED ORDER — CLOPIDOGREL BISULFATE 75 MG PO TABS
75.0000 mg | ORAL_TABLET | Freq: Every day | ORAL | Status: DC
  Administered 2011-11-13 – 2011-11-14 (×2): 75 mg via ORAL
  Filled 2011-11-12 (×3): qty 1

## 2011-11-12 MED ORDER — FUROSEMIDE 10 MG/ML IJ SOLN
40.0000 mg | Freq: Once | INTRAMUSCULAR | Status: AC
  Administered 2011-11-12: 40 mg via INTRAVENOUS
  Filled 2011-11-12: qty 4

## 2011-11-12 MED ORDER — ONDANSETRON HCL 4 MG/2ML IJ SOLN: 4.0000 mg | Freq: Four times a day (QID) | INTRAMUSCULAR | Status: DC | PRN

## 2011-11-12 MED ORDER — CALCITRIOL 0.25 MCG PO CAPS
0.2500 ug | ORAL_CAPSULE | Freq: Every day | ORAL | Status: DC
  Administered 2011-11-13 – 2011-11-14 (×2): 0.25 ug via ORAL
  Filled 2011-11-12 (×2): qty 1

## 2011-11-12 MED ORDER — FUROSEMIDE 10 MG/ML IJ SOLN
80.0000 mg | Freq: Four times a day (QID) | INTRAMUSCULAR | Status: DC
  Administered 2011-11-12 – 2011-11-13 (×3): 80 mg via INTRAVENOUS
  Filled 2011-11-12 (×5): qty 8

## 2011-11-12 MED ORDER — FUROSEMIDE 10 MG/ML IJ SOLN
40.0000 mg | Freq: Once | INTRAMUSCULAR | Status: DC
  Filled 2011-11-12 (×2): qty 4

## 2011-11-12 MED ORDER — LOSARTAN POTASSIUM 50 MG PO TABS
100.0000 mg | ORAL_TABLET | Freq: Every day | ORAL | Status: DC
  Administered 2011-11-13 – 2011-11-14 (×2): 100 mg via ORAL
  Filled 2011-11-12 (×2): qty 2

## 2011-11-12 MED ORDER — SODIUM CHLORIDE 0.9 % IV SOLN
20.0000 mL | INTRAVENOUS | Status: DC
  Administered 2011-11-12: 20 mL via INTRAVENOUS

## 2011-11-12 MED ORDER — ISOSORBIDE MONONITRATE ER 60 MG PO TB24
60.0000 mg | ORAL_TABLET | Freq: Every day | ORAL | Status: DC
  Administered 2011-11-13 – 2011-11-14 (×2): 60 mg via ORAL
  Filled 2011-11-12 (×2): qty 1

## 2011-11-12 MED ORDER — AMLODIPINE BESYLATE 10 MG PO TABS
10.0000 mg | ORAL_TABLET | Freq: Every day | ORAL | Status: DC
  Administered 2011-11-13 – 2011-11-14 (×2): 10 mg via ORAL
  Filled 2011-11-12 (×2): qty 1

## 2011-11-12 MED ORDER — INSULIN GLARGINE 100 UNIT/ML ~~LOC~~ SOLN
80.0000 [IU] | Freq: Two times a day (BID) | SUBCUTANEOUS | Status: DC
  Administered 2011-11-13 – 2011-11-14 (×3): 80 [IU] via SUBCUTANEOUS
  Filled 2011-11-12: qty 3

## 2011-11-12 MED ORDER — SODIUM CHLORIDE 0.9 % IJ SOLN
3.0000 mL | INTRAMUSCULAR | Status: DC | PRN
  Administered 2011-11-13: 3 mL via INTRAVENOUS

## 2011-11-12 NOTE — ED Notes (Signed)
Didn't get temp because patient is drinking and eating.

## 2011-11-12 NOTE — H&P (Signed)
[facility] Admission History and Physical  Patient name: Jonathon Bailey Medical record number: [id number] Date of birth: August 07, 1956 Age: 56 y.o. Gender: male  Primary Care Provider: Hulan Saas, DO, DO  Chief Complaint: LE Edema  History of Present Illness: Jonathon Bailey is a 56 y.o. year old male presenting with worsening LE edema for the past 3-4 days and SOB requirirng sleeping in recliner. Pt seen in clinic on 2/15 by Dr. Tamala Julian who noted pts elevated BNP and increased home torsemide to 150mg  BID. Pt said it wasn't working so he went back to his home dose or 100mg  BID this morning. Denies any syncope, lightheadedness, diaphoresis, vision change, HA. Pt also complaining of some L anterior chest/shoulder pain which has been present for a few days that was relieved w/ tylenol and is no longer present. No precipitating/agrevating factors associated w/ the chest pain pain.   Review Of Systems: Per HPI with the following additions:  Negative: recent fever, n/v/d, rash, dysuria, hematuria, hematochezia, hematemesis,  Positive Constipation  Otherwise 12 point review of systems was performed and was unremarkable.  Patient Active Problem List  Diagnoses  . DIABETES MELLITUS, TYPE II, UNCONTROLLED  . DIABETIC PERIPHERAL NEUROPATHY  . HYPERLIPIDEMIA  . ERECTILE DYSFUNCTION  . OBSTRUCTIVE SLEEP APNEA  . BLURRED VISION  . ESSENTIAL HYPERTENSION  . CORONARY ARTERY DISEASE  . CHRONIC SYSTOLIC HEART FAILURE  . STROKE  . PNEUMOCOCCAL PNEUMONIA  . CKD (chronic kidney disease) stage 4, GFR 15-29 ml/min  . INGROWN NAIL  . SHOULDER PAIN, RIGHT  . DIZZINESS  . HEADACHE  . DYSARTHRIA  . DYSPNEA  . CEREBROVASCULAR ACCIDENT, HX OF  . Persistent cough  . Chest pain  . Bronchospasm with bronchitis, acute  . Shortness of breath dyspnea   Past Medical History: Past Medical History  Diagnosis Date  . CHF (congestive heart failure)   . Diabetes mellitus   .  Hyperlipidemia   . Hypertension   . Chronic kidney disease   . Stroke   . Myocardial infarction     status post MI x2 and 3 stents placed in 2003  . OSA on CPAP     Past Surgical History: Past Surgical History  Procedure Date  . Hernia repair   . Coronary stent placement     Social History: History   Social History  . Marital Status: Divorced    Spouse Name: N/A    Number of Children: N/A  . Years of Education: N/A   Social History Main Topics  . Smoking status: Former Smoker -- 1.0 packs/day for 15 years    Quit date: 09/24/1978  . Smokeless tobacco: None  . Alcohol Use: No  . Drug Use: No  . Sexually Active: None   Other Topics Concern  . None   Social History Narrative  . None    Family History: Family History  Problem Relation Age of Onset  . Asthma Mother   . Stroke Father   . Heart attack Father   . Prostate cancer Father   . Deep vein thrombosis Father     Allergies: No Known Allergies  Current Facility-Administered Medications  Medication Dose Route Frequency Provider Last Rate Last Dose  . 0.9 %  sodium chloride infusion  20 mL Intravenous Continuous Richarda Blade, MD 20 mL/hr at 11/12/11 2057 20 mL at 11/12/11 2057  . furosemide (LASIX) injection 40 mg  40 mg Intravenous Once Richarda Blade, MD   40 mg at 11/12/11  2101  . furosemide (LASIX) injection 40 mg  40 mg Intravenous Once Richarda Blade, MD      . furosemide (LASIX) injection 80 mg  80 mg Intravenous Q6H Lynne Leader, MD       Current Outpatient Prescriptions  Medication Sig Dispense Refill  . albuterol (PROVENTIL HFA;VENTOLIN HFA) 108 (90 BASE) MCG/ACT inhaler Inhale 2 puffs into the lungs every 4 (four) hours as needed. For shortness of breath and wheezing      . amLODipine (NORVASC) 10 MG tablet Take 10 mg by mouth daily.        . benzonatate (TESSALON) 100 MG capsule Take 100 mg by mouth 2 (two) times daily as needed. For cough      . calcitRIOL (ROCALTROL) 0.25 MCG capsule Take  0.25 mcg by mouth daily.      . carvedilol (COREG) 25 MG tablet Take 25 mg by mouth 2 (two) times daily with meals.        . cetirizine (ZYRTEC) 10 MG tablet Take 10 mg by mouth daily as needed. For nasal congestion      . clopidogrel (PLAVIX) 75 MG tablet Take 75 mg by mouth daily.        . insulin glargine (LANTUS) 100 UNIT/ML injection Inject 80 Units into the skin 2 (two) times daily.      . isosorbide mononitrate (IMDUR) 60 MG 24 hr tablet Take 60 mg by mouth daily.      Marland Kitchen lisinopril (PRINIVIL,ZESTRIL) 20 MG tablet Take 40 mg by mouth daily.      Marland Kitchen losartan (COZAAR) 100 MG tablet Take 100 mg by mouth daily. Take one tablet in the morning and one-half in the evening      . rosuvastatin (CRESTOR) 40 MG tablet Take 40 mg by mouth at bedtime.      . torsemide (DEMADEX) 100 MG tablet Take 100 mg by mouth 2 (two) times daily.         Physical Exam: BP 150/73  Pulse 85  Temp(Src) 98.3 F (36.8 C) (Oral)  Resp 19  Ht 6\' 1"  (1.854 m)  Wt 312 lb (141.522 kg)  BMI 41.16 kg/m2  SpO2 95% General: alert, cooperative and appears stated age HEENT: PERRLA, neck supple with midline trachea, thyroid without masses and trachea midline Heart: S1, S2 normal, no murmur, rub or gallop, regular rate and rhythm Lungs: Mild intermittent end expiratory wheeze, decreased breath sounds over the RLL base in the posterior field  Abdomen: Obese, NABS, abdomen is soft without significant tenderness, masses, organomegaly or guarding Extremities: 3+ LE pitting edema bilaterally. Non-painful to palpation.  Musculoskeletal: no joint tenderness, deformity or swelling, no muscular tenderness noted, full range of motion without pain Skin:no ecchymoses, no petechiae, no nodules, no purpura, LE excoriations on R and L LE Neurology: normal without focal findings and mental status, speech normal, alert and oriented x3  Labs and Imaging: Results for orders placed during the hospital encounter of 11/12/11 (from the past 24  hour(s))  PRO B NATRIURETIC PEPTIDE     Status: Abnormal   Collection Time   11/12/11  8:28 PM      Component Value Range   Pro B Natriuretic peptide (BNP) 932.4 (*) 0 - 125 (pg/mL)   CXR: 1. Interval borderline cardiomegaly and increased pulmonary vascular congestion.  2. Stable interstitial lung disease.  EKG:  Prolonged QT Non-specific T-2 wave abnormality  Assessment and Plan: TARREL SWARM is a 56 y.o. year old male presenting with multiple  day h/o worsening SOB and LE edema  1. Edema/SOB: Likely CHF exacerbation. Old Echo in 2010showed EF of 30-40% and dilated cardiomyopathy. Concern for worsening condition since previous Echo. Pt reports low salt intake diet. Poor Cr noted likely adding to fluid overload. Pt unsure of effectiveness of diuresis on significant torsemide regimen. - Echo w/ contrast tomorrow - Troponins - Lasix 80 Q6 - Strict I/O and daily weights - fluid restrictions  - low sodium diet - BMET/CBC  2. A on CKD: Cr on 2/15 of 3.53. Pt baseline Cr of ~3 for the past year. Likely bump from increased diuresis w/ torsemide.  - BMET - CLosely monitor I/O   DM: Last Hgb A1C 9.7 - Continue home Lantus 80 BID  2. FEN/GI: Tolerating diet at home  - Hrt healthy diet - SLIV   3. Prophylaxis:  - Plavix for h/o CAD - Hep TID  4. Disposition: Pending further workup    Signed: MERRELL, DAVID, M.D. Family Medicine Resident PGY-1 11/12/2011 10:50 PM  PGY3 H&P addendum. I was present for and participated in the H &P and the writing of this note. I agree with the above.   Burnard Enis

## 2011-11-12 NOTE — ED Provider Notes (Signed)
History     CSN: [id number]  Arrival date & time 11/12/11  37   First MD Initiated Contact with Patient 11/12/11 1924      Chief Complaint  Patient presents with  . Leg Swelling    (Consider location/radiation/quality/duration/timing/severity/associated sxs/prior treatment) Patient is a 56 y.o. male presenting with shortness of breath. The history is provided by the patient.  Shortness of Breath  Associated symptoms include shortness of breath. Pertinent negatives include no chest pain, no chest pressure, no fever and no cough. His past medical history is significant for asthma. Urine output has been normal. The last void occurred less than 6 hours ago. Recently, medical care has been given by the PCP (He was treated by his doctor 6 days ago for bronchitis. He returned to the office 4 days ago and was told to take extra torsemide. He not improve.).   his baseline weight is 298 lbs. 5 days ago he weighed 315 pounds. He denies appetite problems, nausea, vomiting, back pain, weakness, or dizziness.        Past Medical History  Diagnosis Date  . CHF (congestive heart failure)   . Diabetes mellitus   . Hyperlipidemia   . Hypertension   . Chronic kidney disease   . Stroke   . Myocardial infarction     status post MI x2 and 3 stents placed in 2003  . OSA on CPAP     Past Surgical History  Procedure Date  . Hernia repair   . Coronary stent placement     Family History  Problem Relation Age of Onset  . Asthma Mother   . Stroke Father   . Heart attack Father   . Prostate cancer Father   . Deep vein thrombosis Father     History  Substance Use Topics  . Smoking status: Former Smoker -- 1.0 packs/day for 15 years    Quit date: 09/24/1978  . Smokeless tobacco: Not on file  . Alcohol Use: No      Review of Systems  Constitutional: Negative for fever.  Respiratory: Positive for shortness of breath. Negative for cough.   Cardiovascular: Negative for chest pain.    All other systems reviewed and are negative.    Allergies  Review of patient's allergies indicates no known allergies.  Home Medications   Current Outpatient Rx  Name Route Sig Dispense Refill  . ALBUTEROL SULFATE HFA 108 (90 BASE) MCG/ACT IN AERS Inhalation Inhale 2 puffs into the lungs every 4 (four) hours as needed. For shortness of breath and wheezing    . AMLODIPINE BESYLATE 10 MG PO TABS Oral Take 10 mg by mouth daily.      Marland Kitchen BENZONATATE 100 MG PO CAPS Oral Take 100 mg by mouth 2 (two) times daily as needed. For cough    . CALCITRIOL 0.25 MCG PO CAPS Oral Take 0.25 mcg by mouth daily.    Marland Kitchen CARVEDILOL 25 MG PO TABS Oral Take 25 mg by mouth 2 (two) times daily with meals.      Marland Kitchen CETIRIZINE HCL 10 MG PO TABS Oral Take 10 mg by mouth daily as needed. For nasal congestion    . CLOPIDOGREL BISULFATE 75 MG PO TABS Oral Take 75 mg by mouth daily.      . INSULIN GLARGINE 100 UNIT/ML [place] SOLN Subcutaneous Inject 80 Units into the skin 2 (two) times daily.    . ISOSORBIDE MONONITRATE ER 60 MG PO TB24 Oral Take 60 mg by mouth daily.    Marland Kitchen  LISINOPRIL 20 MG PO TABS Oral Take 40 mg by mouth daily.    Marland Kitchen LOSARTAN POTASSIUM 100 MG PO TABS Oral Take 100 mg by mouth daily. Take one tablet in the morning and one-half in the evening    . ROSUVASTATIN CALCIUM 40 MG PO TABS Oral Take 40 mg by mouth at bedtime.    . TORSEMIDE 100 MG PO TABS Oral Take 100 mg by mouth 2 (two) times daily.      BP 148/69  Pulse 83  Temp(Src) 98.3 F (36.8 C) (Oral)  Resp 20  Ht 6\' 1"  (1.854 m)  Wt 312 lb (141.522 kg)  BMI 41.16 kg/m2  SpO2 93%  Physical Exam  Nursing note and vitals reviewed. Constitutional: He is oriented to person, place, and time. He appears well-developed and well-nourished.  HENT:  Head: Normocephalic and atraumatic.  Right Ear: External ear normal.  Left Ear: External ear normal.  Eyes: Conjunctivae and EOM are normal. Pupils are equal, round, and reactive to light.  Neck: Normal range of  motion and phonation normal. Neck supple.  Cardiovascular: Normal rate, regular rhythm, normal heart sounds and intact distal pulses.        No JVD. Normal S1, S2. No murmur  Pulmonary/Chest: Effort normal and breath sounds normal. He exhibits no bony tenderness.  Abdominal: Soft. Normal appearance and bowel sounds are normal. He exhibits no mass. There is no tenderness. There is no guarding.  Musculoskeletal: Normal range of motion. He exhibits edema (3+ right lower leg 2+ left lower).  Neurological: He is alert and oriented to person, place, and time. He has normal strength. No cranial nerve deficit or sensory deficit. He exhibits normal muscle tone. Coordination normal.  Skin: Skin is warm, dry and intact.       Excoriated rash, right medial ankle, no swelling or discharge  Psychiatric: He has a normal mood and affect. His behavior is normal. Judgment and thought content normal.    ED Course  Procedures (including critical care time)  Initial assessment is likely fluid overload versus congestive heart failure. Weight is 13 pounds above baseline. IV Lasix ordered when seen initially.  22:05: Patient did not void with the initial dose of Lasix. A second dose ordered and will arrange admission    Date: 11/12/2011  Rate: 87  Rhythm: normal sinus rhythm  QRS Axis: normal  Intervals: QT prolonged  ST/T Wave abnormalities: nonspecific T wave changes  Conduction Disutrbances:none  Narrative Interpretation: QT longer and nonspecific TWA new today  Old EKG Reviewed: changes noted     Labs Reviewed  PRO B NATRIURETIC PEPTIDE - Abnormal; Notable for the following:    Pro B Natriuretic peptide (BNP) 932.4 (*)    All other components within normal limits   Dg Chest 2 View  11/12/2011  *RADIOLOGY REPORT*  Clinical Data: Right chest pain.  Shortness of breath.  Bilateral foot swelling.  CHEST - 2 VIEW  Comparison: 11/07/2011.  Findings: The cardiac silhouette is borderline enlarged with a  mild interval increase in size.  Mild increase in prominence of the pulmonary vasculature.  Stable prominent interstitial markings and diffuse peribronchial thickening.  No pleural fluid.  Unremarkable bones.  IMPRESSION:  1.  Interval borderline cardiomegaly and increased pulmonary vascular congestion. 2.  Stable interstitial lung disease.  Original Report Authenticated By: Gerald Stabs, M.D.     1. CHF (congestive heart failure)       MDM  Congestive heart failure with weight gain. Patient's BNP is  significantly greater than his baseline. He'll need to be admitted for monitoring and likely medication changes.        Richarda Blade, MD 11/12/11 2239

## 2011-11-12 NOTE — ED Notes (Signed)
Pt to ED c/o swelling in bil lower legs.  St's he saw MD on 2/15 and was told to take extra Lasix.  Pt was also dx with bronchitis, st's unable to lay down to sleep has to sit in chair.

## 2011-11-13 ENCOUNTER — Encounter (HOSPITAL_COMMUNITY): Payer: Self-pay | Admitting: General Practice

## 2011-11-13 DIAGNOSIS — E118 Type 2 diabetes mellitus with unspecified complications: Secondary | ICD-10-CM

## 2011-11-13 DIAGNOSIS — N189 Chronic kidney disease, unspecified: Secondary | ICD-10-CM

## 2011-11-13 DIAGNOSIS — I5023 Acute on chronic systolic (congestive) heart failure: Secondary | ICD-10-CM

## 2011-11-13 LAB — BASIC METABOLIC PANEL
BUN: 38 mg/dL — ABNORMAL HIGH (ref 6–23)
BUN: 38 mg/dL — ABNORMAL HIGH (ref 6–23)
CO2: 23 mEq/L (ref 19–32)
Calcium: 9 mg/dL (ref 8.4–10.5)
Creatinine, Ser: 3.37 mg/dL — ABNORMAL HIGH (ref 0.50–1.35)
GFR calc Af Amer: 22 mL/min — ABNORMAL LOW (ref >90)
GFR calc non Af Amer: 19 mL/min — ABNORMAL LOW (ref >90)
GFR calc non Af Amer: 21 mL/min — ABNORMAL LOW (ref >90)
Glucose, Bld: 182 mg/dL — ABNORMAL HIGH (ref 70–99)
Sodium: 139 mEq/L (ref 135–145)

## 2011-11-13 LAB — CBC
HCT: 29.6 % — ABNORMAL LOW (ref 39.0–52.0)
MCHC: 32.4 g/dL (ref 30.0–36.0)
MCV: 86.8 fL (ref 78.0–100.0)
Platelets: 371 10*3/uL (ref 150–400)
RDW: 14 % (ref 11.5–15.5)
WBC: 11.7 10*3/uL — ABNORMAL HIGH (ref 4.0–10.5)

## 2011-11-13 LAB — CARDIAC PANEL(CRET KIN+CKTOT+MB+TROPI)
CK, MB: 8.9 ng/mL (ref 0.3–4.0)
CK, MB: 8.1 ng/mL (ref 0.3–4.0)
Relative Index: 1.4 (ref 0.0–2.5)
Relative Index: 1.3 (ref 0.0–2.5)
Total CK: 653 U/L — ABNORMAL HIGH (ref 7–232)
Total CK: 611 U/L — ABNORMAL HIGH (ref 7–232)

## 2011-11-13 LAB — MRSA PCR SCREENING: MRSA by PCR: POSITIVE — AB

## 2011-11-13 MED ORDER — FUROSEMIDE 10 MG/ML IJ SOLN
80.0000 mg | Freq: Two times a day (BID) | INTRAMUSCULAR | Status: DC
  Administered 2011-11-14: 80 mg via INTRAVENOUS
  Filled 2011-11-13 (×4): qty 8

## 2011-11-13 MED ORDER — POTASSIUM CHLORIDE CRYS ER 20 MEQ PO TBCR
40.0000 meq | EXTENDED_RELEASE_TABLET | Freq: Once | ORAL | Status: AC
  Administered 2011-11-13: 40 meq via ORAL
  Filled 2011-11-13: qty 2

## 2011-11-13 MED ORDER — POLYETHYLENE GLYCOL 3350 17 G PO PACK
17.0000 g | PACK | Freq: Every day | ORAL | Status: DC
  Administered 2011-11-13 – 2011-11-14 (×2): 17 g via ORAL
  Filled 2011-11-13 (×2): qty 1

## 2011-11-13 NOTE — Progress Notes (Signed)
[facility] Progress Note  Patient name: Jonathon Bailey Medical record number: [id number] Date of birth: 13-Aug-1956 Age: 56 y.o. Gender: male    LOS: 1 day   Primary Care Provider: SMITH,ZACHARY, DO, DO  Overnight Events:  NAEO. Edema and SOB no better per pt. Feels well overall. Eating and sleeping w/o problems  Denis HA, n/v/d, constipations, CP, syncope, lightheadedness, dysuria  Objective: Vital signs in last 24 hours: Temp:  [97.6 F (36.4 C)-98.9 F (37.2 C)] 98.9 F (37.2 C) (02/19 0648) Pulse Rate:  [82-92] 87  (02/19 0648) Resp:  [19-22] 20  (02/19 0648) BP: (145-173)/(65-87) 162/87 mmHg (02/19 0648) SpO2:  [93 %-98 %] 98 % (02/19 0648) Weight:  [311 lb 6.4 oz (141.25 kg)-312 lb (141.522 kg)] 311 lb 6.4 oz (141.25 kg) (02/18 2358)  Wt Readings from Last 3 Encounters:  11/12/11 311 lb 6.4 oz (141.25 kg)  11/09/11 311 lb (141.069 kg)  11/07/11 315 lb (142.883 kg)    Intake/Output Summary (Last 24 hours) at 11/13/11 0726 Last data filed at 11/13/11 [id number]  Gross per 24 hour  Intake   1219 ml  Output   2500 ml  Net  -1281 ml      Current Facility-Administered Medications  Medication Dose Route Frequency Provider Last Rate Last Dose  . 0.9 %  sodium chloride infusion  20 mL Intravenous Continuous Richarda Blade, MD 20 mL/hr at 11/12/11 2057 20 mL at 11/12/11 2057  . 0.9 %  sodium chloride infusion  250 mL Intravenous PRN Lynne Leader, MD      . acetaminophen (TYLENOL) tablet 650 mg  650 mg Oral Q4H PRN Lynne Leader, MD      . amLODipine (NORVASC) tablet 10 mg  10 mg Oral Daily Lynne Leader, MD      . calcitRIOL (ROCALTROL) capsule 0.25 mcg  0.25 mcg Oral Daily Lynne Leader, MD      . carvedilol (COREG) tablet 25 mg  25 mg Oral BID WC Lynne Leader, MD   25 mg at 11/13/11 0641  . clopidogrel (PLAVIX) tablet 75 mg  75 mg Oral Daily Lynne Leader, MD      . furosemide (LASIX) injection 40 mg  40 mg Intravenous Once Richarda Blade, MD   40 mg at  11/12/11 2101  . furosemide (LASIX) injection 80 mg  80 mg Intravenous Q6H Lynne Leader, MD   80 mg at 11/13/11 0458  . heparin injection 5,000 Units  5,000 Units Subcutaneous Q8H Lynne Leader, MD   5,000 Units at 11/13/11 0641  . insulin glargine (LANTUS) injection 80 Units  80 Units Subcutaneous BID Lynne Leader, MD      . isosorbide mononitrate (IMDUR) 24 hr tablet 60 mg  60 mg Oral Daily Lynne Leader, MD      . losartan (COZAAR) tablet 100 mg  100 mg Oral Daily Lynne Leader, MD      . ondansetron [facility]) injection 4 mg  4 mg Intravenous Q6H PRN Lynne Leader, MD      . polyethylene glycol (MIRALAX / GLYCOLAX) packet 17 g  17 g Oral Daily Linna Darner, MD      . rosuvastatin (CRESTOR) tablet 40 mg  40 mg Oral QHS Lynne Leader, MD      . sodium chloride 0.9 % injection 3 mL  3 mL Intravenous Q12H Lynne Leader, MD      . sodium chloride 0.9 % injection 3 mL  3 mL Intravenous PRN Lynne Leader, MD  3 mL at 11/13/11 0458  . DISCONTD: furosemide (LASIX) injection 40 mg  40 mg Intravenous Once Richarda Blade, MD         PE: Gen: Obese, NAD HEENT:MMM CV:RRR, no m/r/g [id number] airway congestion which improves w/ coughing. Normal effort (head of Pt bed elevated to 30 degrees) [id number], NABS, nontender to palpation Ext/Musc: 2+ pitting edema Neuro:CN grossly intact  Labs/Studies:  Results for orders placed during the hospital encounter of 11/12/11 (from the past 24 hour(s))  PRO B NATRIURETIC PEPTIDE     Status: Abnormal   Collection Time   11/12/11  8:28 PM      Component Value Range   Pro B Natriuretic peptide (BNP) 932.4 (*) 0 - 125 (pg/mL)  CARDIAC PANEL(CRET KIN+CKTOT+MB+TROPI)     Status: Abnormal   Collection Time   11/13/11 12:29 AM      Component Value Range   Total CK 653 (*) 7 - 232 (U/L)   CK, MB 8.9 (*) 0.3 - 4.0 (ng/mL)   Troponin I <0.30  <0.30 (ng/mL)   Relative Index 1.4  0.0 - 2.5   CBC     Status: Abnormal   Collection Time   11/13/11 12:30 AM      Component Value Range   WBC  11.7 (*) 4.0 - 10.5 (K/uL)   RBC 3.41 (*) 4.22 - 5.81 (MIL/uL)   Hemoglobin 9.6 (*) 13.0 - 17.0 (g/dL)   HCT 29.6 (*) 39.0 - 52.0 (%)   MCV 86.8  78.0 - 100.0 (fL)   MCH 28.2  26.0 - 34.0 (pg)   MCHC 32.4  30.0 - 36.0 (g/dL)   RDW 14.0  11.5 - 15.5 (%)   Platelets 371  150 - 400 (K/uL)  BASIC METABOLIC PANEL     Status: Abnormal   Collection Time   11/13/11 12:30 AM      Component Value Range   Sodium 143  135 - 145 (mEq/L)   Potassium 3.8  3.5 - 5.1 (mEq/L)   Chloride 106  96 - 112 (mEq/L)   CO2 29  19 - 32 (mEq/L)   Glucose, Bld 139 (*) 70 - 99 (mg/dL)   BUN 38 (*) 6 - 23 (mg/dL)   Creatinine, Ser 3.37 (*) 0.50 - 1.35 (mg/dL)   Calcium 9.4  8.4 - 10.5 (mg/dL)   GFR calc non Af Amer 19 (*) >90 (mL/min)   GFR calc Af Amer 22 (*) >90 (mL/min)  MRSA PCR SCREENING     Status: Abnormal   Collection Time   11/13/11  3:20 AM      Component Value Range   MRSA by PCR POSITIVE (*) NEGATIVE       Assessment/Plan: Jonathon Bailey is a 56 y.o. year old male presenting with multiple day h/o worsening SOB and LE edema   1. Edema/SOB: Likely CHF exacerbation. Old Echo in 2010showed EF of 30-40% and dilated cardiomyopathy. Poor Cr noted likely adding to fluid overload, though good response overnight to diuresis (UOP of 2500). Troponin x1 neg - Echo w/ contrast today - Troponins X1 - Lasix 80 Q6  (Will discuss decreasing dose w/ care team) - Strict I/O and daily weights   - low sodium diet   2. A on CKD: Cr on 2/15 of 3.53 and 3.37 after midnight. Pt baseline Cr of ~3 for the past year.  - CLosely monitor I/O  - Repeat BMET in AM  DM: Last Hgb A1C 9.7  - Continue home Lantus 80  BID   2. FEN/GI: Tolerating diet at home  - Hrt healthy diet  - Normal K but concern pt could become hypokalemic due to heavy diuresis. Will provide Kdur 75mEq x1 - BMET in am - SLIV   3. Prophylaxis:  - Plavix for h/o CAD  - Hep TID   4. Disposition: Pending further workup   Signed: Linna Darner, MD Family Medicine Resident PGY-1 11/13/2011 7:25 AM

## 2011-11-13 NOTE — Progress Notes (Signed)
Pt had a run of SVT with heart rate in the 160's-170's.  Pt asymptomatic at the time reports recently transferring from the bed to a chair after moving the chair closer to the bedside.  MD has been notified and would like to be called if pt has another run in the future.  Will continue to monitor.  Gae Gallop RN

## 2011-11-13 NOTE — Progress Notes (Signed)
  Subjective:    Patient ID: Jonathon Bailey, male    DOB: 05-Nov-1955, 56 y.o.   MRN: [id number]  HPI Comments: Primary Care Note  Appreciate the fantastic care the inpt service is providing my patient.   Patient has hx of being noncompliant with his medications and fell behind on treatment.  Patient seems to be doing better and diuresing well.  Would appreciate inpt team to change medications accordingly to help with his fluid balance.  Give pt a dry weight at discharge to help pt monitor.   Also, if you could refill medications to pharmacy at time of discharge.  Thanks you.      Review of Systems     Objective:   Physical Exam        Assessment & Plan:

## 2011-11-13 NOTE — Progress Notes (Signed)
  Echocardiogram 2D Echocardiogram has been performed.  Diamond Nickel Deneen 11/13/2011, 11:02 AM

## 2011-11-13 NOTE — Progress Notes (Signed)
Was reported to me by ED nurse that patient had a history of MRSA. Patient was placed on contact precautions at 0000 when he arrived on the unit until MRSA could be ruled out. PCR swab of nostrils done. Called by lab this am at 0527 and notified that patients PCR screen was positive for MRSA.

## 2011-11-13 NOTE — H&P (Signed)
Family Medicine Teaching Service Attending Note  I interviewed and examined patient Jonathon Bailey and reviewed their tests and x-rays.  I discussed with Dr. Georgina Snell and reviewed their note for today.  I agree with their assessment and plan.     Additionally  Feeling some better today urinating small amounts. Only mild shortness of breath when lies flat Diffuse edema of extremities and sacral area Lungs - distant breath sounds with few basilar rales  Fluid overload - agree with iv diuresis and check echo.   If unable to diurese or  creatinine worsens consider renal consult ? Continue losartan

## 2011-11-13 NOTE — Progress Notes (Signed)
Family Medicine Teaching Service Attending Note  I interviewed and examined patient Jonathon Bailey and reviewed their tests and x-rays.  I discussed with Dr. Saralyn Pilar and reviewed their note for today.  I agree with their assessment and plan.     Additionally  See my note on HP

## 2011-11-13 NOTE — Progress Notes (Signed)
Utilization review complete 

## 2011-11-13 NOTE — Progress Notes (Signed)
CRITICAL VALUE ALERT  Critical value received:  CK MB 8.9  Date of notification:  11/13/2011  Time of notification:  0140  Critical value read back:yes  Nurse who received alert:  Fredia Beets, RN  MD notified (1st page):  Dr.Murrell  Time of first page:  0144  MD notified (2nd page):  Time of second page:  Responding MD:  Dr.Murrell  Time MD responded:  [phone]

## 2011-11-14 LAB — BASIC METABOLIC PANEL
BUN: 38 mg/dL — ABNORMAL HIGH (ref 6–23)
CO2: 27 mEq/L (ref 19–32)
Chloride: 106 mEq/L (ref 96–112)
Glucose, Bld: 95 mg/dL (ref 70–99)
Potassium: 4.4 mEq/L (ref 3.5–5.1)
Sodium: 140 mEq/L (ref 135–145)

## 2011-11-14 LAB — GLUCOSE, CAPILLARY
Glucose-Capillary: 72 mg/dL (ref 70–99)
Glucose-Capillary: 92 mg/dL (ref 70–99)
Glucose-Capillary: 112 mg/dL — ABNORMAL HIGH (ref 70–99)

## 2011-11-14 MED ORDER — LOSARTAN POTASSIUM 100 MG PO TABS: 100.0000 mg | ORAL_TABLET | Freq: Every day | ORAL | Status: DC

## 2011-11-14 MED ORDER — TORSEMIDE 100 MG PO TABS: 150.0000 mg | ORAL_TABLET | Freq: Two times a day (BID) | ORAL | Status: DC

## 2011-11-14 NOTE — Discharge Instructions (Signed)
You were admitted to the hospital due to difficulty breathing and lower extremity swelling related to your heart. It is very important that you continue to work towards keeping your blood pressure under control and that you wear your CPAP at night. Please stop taking the Lisinopril for the next week to 10 days. Please start taking all of your other medications as prescribed by Dr. Tamala Julian, which includes the Torsemide 150mg  twice a day. Please follow up with Dr. Tamala Julian in clinic on Friday at 9:30am. Please also make sure to check your weight every morning after waking up and voiding. Let Dr. Tamala Julian know what your results are so we can better help tailor your medications to your needs. Have a great weekend.

## 2011-11-14 NOTE — Discharge Summary (Signed)
Family Medicine Resident Discharge Summary  Patient ID: Jonathon Bailey [id number] 56 y.o. 08/12/1956  Admit date: 11/12/2011  Discharge date and time: 09/12/12  Admitting Physician: Lind Covert, MD   Discharge Physician: Lind Covert, MD  Admission Diagnoses: CHF (congestive heart failure) [428.0] rt foot swelling  Discharge Diagnoses: diastolic CHF exacerbation   Admission Condition: fair  Discharged Condition: good  Indication for Admission: Fluid overload and concern for CHF exacerbation w/ elevated CK/CKMB on admission  Hospital Course: Jonathon Bailey is a 56 y.o. year old male presenting with worsening LE edema and SOB requirirng sleeping in recliner for 3-4 days prior to admission   Edema/SOB: Pt Pro BNP noted to be 932.4 on admission w/ a Cr >3 after recent increased diuresis treatment by PCP over the weekend. Pt troponins were negative but CK and CKMB were elevated. CXR Concerning for pulmonary congestion. Pt admitted and started on IV Lasix 80mg  Q6 w/ good diuresis response. Net fluid output of 3.71L during admission. Night prior to DC pt reports being able sleep fully lying down, and w/o SOB on DC. LE edema much improved at time of DC  CKD: Pt noted to have a baseline Cr around 3.3. Cr on admission of 3.37. Cr monitored during admission w/ improvement to 3.25 at time of DC. Pt responded very well to diuresis during admission. Inpt team called to discuss w/ Pts Nephrologist who had nothing to add for this admission.  DM: Last Hgb A1c of 9.7 prior to admission. Managed on home Lantus 80 BID.   HTN: closely monitored during admission. Pts lisinopril held due to pt also being on Losartaan 100mg  and concern for kidney damage due to elevated Cr. Nephrologist recommended restarting lisinopril in 7-10 days after DC.    Consults: none  Significant Diagnostic Studies:  Echo: - Left ventricle: The cavity size was normal. There was moderate concentric  hypertrophy. Systolic function was at the lower limits of normal. The estimated ejection fraction was in the range of 50% to 55%. Hypokinesis of the anterolateral and inferolateral myocardium. Possible hypokinesis of the inferior myocardium. Doppler parameters are consistent with restrictive physiology, indicative of decreased left ventricular diastolic compliance and/or increased left atrial pressure. Doppler parameters are consistent with elevated mean left atrial filling pressure. - Aortic valve: Trivial regurgitation. - Left atrium: The atrium was mildly dilated.   CXR: 1. Interval borderline cardiomegaly and increased pulmonary vascular congestion.  2. Stable interstitial lung disease.  TSH: 0000000  Basic Metabolic Panel:    Component Value Date/Time   NA 140 11/14/2011 0550   K 4.4 11/14/2011 0550   CL 106 11/14/2011 0550   CO2 27 11/14/2011 0550   BUN 38* 11/14/2011 0550   CREATININE 3.25* 11/14/2011 0550   CREATININE 3.53* 11/09/2011 1621   GLUCOSE 95 11/14/2011 0550   CALCIUM 9.9 11/14/2011 0550   Lab Results  Component Value Date   CKTOTAL 611* 11/13/2011   CKMB 8.1* 11/13/2011   TROPONINI <0.30 11/13/2011   Discharge Exam: Gen: Obese, NAD  CV:RRR, no m/r/g  Res: CTAB, normal effort [id number], NABS, nontender to palpation  Ext/Musc: 1+ pitting edema  Neuro:CN grossly intact   Disposition: home  Patient Instructions:  Current Discharge Medication List    CONTINUE these medications which have NOT CHANGED   Details  albuterol (PROVENTIL HFA;VENTOLIN HFA) 108 (90 BASE) MCG/ACT inhaler Inhale 2 puffs into the lungs every 4 (four) hours as needed. For shortness of breath and wheezing    amLODipine (NORVASC)  10 MG tablet Take 10 mg by mouth daily.      benzonatate (TESSALON) 100 MG capsule Take 100 mg by mouth 2 (two) times daily as needed. For cough    calcitRIOL (ROCALTROL) 0.25 MCG capsule Take 0.25 mcg by mouth daily.    carvedilol (COREG) 25 MG tablet Take 25 mg by  mouth 2 (two) times daily with meals.      cetirizine (ZYRTEC) 10 MG tablet Take 10 mg by mouth daily as needed. For nasal congestion    clopidogrel (PLAVIX) 75 MG tablet Take 75 mg by mouth daily.      insulin glargine (LANTUS) 100 UNIT/ML injection Inject 80 Units into the skin 2 (two) times daily.    isosorbide mononitrate (IMDUR) 60 MG 24 hr tablet Take 60 mg by mouth daily.    lisinopril (PRINIVIL,ZESTRIL) 20 MG tablet Take 40 mg by mouth daily.    losartan (COZAAR) 100 MG tablet Take 100 mg by mouth daily. Take one tablet in the morning and one-half in the evening    rosuvastatin (CRESTOR) 40 MG tablet Take 40 mg by mouth at bedtime.    torsemide (DEMADEX) 100 MG tablet Take 100 mg by mouth 2 (two) times daily.       Activity: activity as tolerated Diet: Low salt diet Wound Care: none needed  Follow-up with Dr. Tamala Julian on Friday at 9:30  Follow-up Items: 1. Restart Lisinopril in 7-10 days from hospitalization 2. F/U weight and continued diuresis.   Signed: Linna Darner, MD Family Medicine Resident PGY-1 11/14/2011 8:55 AM

## 2011-11-14 NOTE — Progress Notes (Signed)
D/c instructions given to pt including f/u care and appointments, home medications, s/s of when to notify MD and diet/activity restrictions.  Pt acknowledged receipt with all questions answered and pt verbalized understanding.  IV and telemetry removed from pt.

## 2011-11-14 NOTE — Discharge Summary (Signed)
Family Medicine Teaching Service Attending Note  I interviewed and examined patient Jonathon Bailey and reviewed their tests and x-rays.  I discussed with Dr. Saralyn Pilar and reviewed their note for today.  I agree with their assessment and plan.     Additionally  He feels well this AM We discussed importance of diet and diabetes control Send home on diuretics and blood pressure medications with close follow up with PCP. He may need intermittent parental diuretics if unable to stick with regimen or unable to absorb

## 2011-11-16 ENCOUNTER — Encounter: Payer: Self-pay | Admitting: Family Medicine

## 2011-11-16 ENCOUNTER — Ambulatory Visit: Payer: Medicare Other | Admitting: Family Medicine

## 2012-01-11 ENCOUNTER — Other Ambulatory Visit: Payer: Self-pay | Admitting: Family Medicine

## 2012-01-11 MED ORDER — TORSEMIDE 100 MG PO TABS: 150.0000 mg | ORAL_TABLET | Freq: Two times a day (BID) | ORAL | Status: DC

## 2012-01-11 NOTE — Progress Notes (Signed)
Addended by: Waldemar Dickens on: 01/11/2012 05:48 PM   Modules accepted: Orders

## 2012-02-06 HISTORY — PX: NM MYOCAR PERF WALL MOTION: HXRAD629

## 2012-02-19 ENCOUNTER — Other Ambulatory Visit: Payer: Self-pay | Admitting: Family Medicine

## 2012-02-19 MED ORDER — INSULIN GLARGINE 100 UNIT/ML ~~LOC~~ SOLN: 80.0000 [IU] | Freq: Two times a day (BID) | SUBCUTANEOUS | Status: DC

## 2012-02-19 NOTE — Telephone Encounter (Signed)
Patient needs a refill to go to [facility] on [address].  He has a new prescription insurance through [employer] and they say he needs a new Rx to replace his Lantus because it is not on their formulary.  He is out of this medication.

## 2012-02-19 NOTE — Telephone Encounter (Signed)
Will called in lantus, looked and medicare formulary and it is on there.  Called patient back and told him.

## 2012-02-20 ENCOUNTER — Other Ambulatory Visit: Payer: Self-pay | Admitting: Family Medicine

## 2012-02-20 ENCOUNTER — Telehealth: Payer: Self-pay | Admitting: Family Medicine

## 2012-02-20 MED ORDER — INSULIN DETEMIR 100 UNIT/ML ~~LOC~~ SOLN: 75.0000 [IU] | Freq: Two times a day (BID) | SUBCUTANEOUS | Status: DC

## 2012-02-20 MED ORDER — INSULIN GLARGINE 100 UNIT/ML ~~LOC~~ SOLN: 80.0000 [IU] | Freq: Two times a day (BID) | SUBCUTANEOUS | Status: DC

## 2012-02-20 MED ORDER — INSULIN NPH (HUMAN) (ISOPHANE) 100 UNIT/ML ~~LOC~~ SUSP: 80.0000 [IU] | Freq: Two times a day (BID) | SUBCUTANEOUS | Status: DC

## 2012-02-20 NOTE — Telephone Encounter (Signed)
  States that the pharmacy told him that Lantus doesn't have generic and it has to be novalog, novalin or s....... [facility] [place]  He is now out.

## 2012-02-20 NOTE — Telephone Encounter (Signed)
Call pharmacy was told that bile is on their formulary for the patient's new insurance. We'll try psoas start pen. Called patient back he states he has the [place] plan attempting to look up formulary to see what other potential medications there are. Told him I will attempt as well as start pen and see if that helps. Patient states that her to charge him $160 for the Lantus.  Found formulary patient does have known and 70/30 on the formulary. If needed would send in 75 units twice a day to start and have him come in and see me soon after starting this medication.

## 2012-02-20 NOTE — Telephone Encounter (Signed)
Called patient back to change to NPH twice a day.

## 2012-02-21 MED ORDER — "INSULIN SYRINGE-NEEDLE U-100 25G X 1"" 1 ML MISC": 1.0000 | Freq: Two times a day (BID) | Status: DC

## 2012-02-21 MED ORDER — INSULIN DETEMIR 100 UNIT/ML ~~LOC~~ SOLN: 75.0000 [IU] | Freq: Two times a day (BID) | SUBCUTANEOUS | Status: DC

## 2012-02-21 NOTE — Telephone Encounter (Signed)
Pt doesn't have the needles for the levemir insulin and needs #10  Also needs a PA for him to get the Lantus for [employer] to cover it.  Needs this asap.  He is in [place] and [place] there is [phone]

## 2012-02-21 NOTE — Telephone Encounter (Signed)
Addended by: Lyndal Pulley on: 02/21/2012 01:24 PM   Modules accepted: Orders

## 2012-02-21 NOTE — Telephone Encounter (Signed)
Addressed problems again [employer] myself as well it should be done.

## 2012-03-03 ENCOUNTER — Encounter: Payer: Self-pay | Admitting: Vascular Surgery

## 2012-03-04 ENCOUNTER — Ambulatory Visit: Payer: Medicare Other | Admitting: Vascular Surgery

## 2012-03-11 ENCOUNTER — Other Ambulatory Visit: Payer: Self-pay | Admitting: *Deleted

## 2012-03-12 MED ORDER — TORSEMIDE 100 MG PO TABS: 150.0000 mg | ORAL_TABLET | Freq: Two times a day (BID) | ORAL | Status: DC

## 2012-04-07 ENCOUNTER — Encounter: Payer: Self-pay | Admitting: Vascular Surgery

## 2012-04-08 ENCOUNTER — Ambulatory Visit: Payer: Medicare Other | Admitting: Vascular Surgery

## 2012-04-21 ENCOUNTER — Other Ambulatory Visit: Payer: Self-pay | Admitting: *Deleted

## 2012-04-23 NOTE — Telephone Encounter (Signed)
This is overdose. Max dose is 200mg  daily. Pt on 300 mg. Pt should discuss this with his PCP.

## 2012-04-25 MED ORDER — TORSEMIDE 100 MG PO TABS: 150.0000 mg | ORAL_TABLET | Freq: Two times a day (BID) | ORAL | Status: DC

## 2012-04-25 NOTE — Telephone Encounter (Signed)
Will refill this at current dose but patient should be seen in the clinic soon. Last office visit was in February. Please have patient make an appointment. Thank you.

## 2012-04-29 ENCOUNTER — Other Ambulatory Visit: Payer: Self-pay

## 2012-04-29 DIAGNOSIS — N184 Chronic kidney disease, stage 4 (severe): Secondary | ICD-10-CM

## 2012-04-29 DIAGNOSIS — Z01810 Encounter for preprocedural cardiovascular examination: Secondary | ICD-10-CM

## 2012-05-05 ENCOUNTER — Encounter: Payer: Self-pay | Admitting: Vascular Surgery

## 2012-05-06 ENCOUNTER — Ambulatory Visit (INDEPENDENT_AMBULATORY_CARE_PROVIDER_SITE_OTHER): Payer: Medicare Other | Admitting: Vascular Surgery

## 2012-05-06 ENCOUNTER — Encounter: Payer: Self-pay | Admitting: Vascular Surgery

## 2012-05-06 ENCOUNTER — Encounter (INDEPENDENT_AMBULATORY_CARE_PROVIDER_SITE_OTHER): Payer: Medicare Other | Admitting: *Deleted

## 2012-05-06 VITALS — BP 127/73 | HR 92 | Resp 16 | Ht 71.0 in | Wt 301.0 lb

## 2012-05-06 DIAGNOSIS — N184 Chronic kidney disease, stage 4 (severe): Secondary | ICD-10-CM

## 2012-05-06 DIAGNOSIS — N186 End stage renal disease: Secondary | ICD-10-CM | POA: Insufficient documentation

## 2012-05-06 DIAGNOSIS — N19 Unspecified kidney failure: Secondary | ICD-10-CM

## 2012-05-06 DIAGNOSIS — Z01810 Encounter for preprocedural cardiovascular examination: Secondary | ICD-10-CM

## 2012-05-06 NOTE — Progress Notes (Signed)
Patient seen today for discussion of AV access. He makes it quite clear that he does not wish to be on hemodialysis and is not convinced that he was to proceed with access placement. He does not feel that having dialysis 3 days a week is something he is currently willing to accept. I explained our role in providing hemodialysis access and that nothing we would recommend would hasten or slow his need for dialysis. He reports that he voids without any pain and feels that his renal function is stable. I do not have documentation of his current level of renal function.  Past Medical History  Diagnosis Date  . Diabetes mellitus   . Hyperlipidemia   . Hypertension   . Myocardial infarction     status post MI x2 and 3 stents placed in 2003  . CHF (congestive heart failure)   . Stroke ~ 2007; ~1987    "weak on right side; messed w/right side of brain; cry all the time"  . Bronchitis 11/13/11    "last time was last week"  . Shortness of breath on exertion   . Orthopnea 11/13/11    "been sleeping in chair last 12 days"  . Sleep apnea     "sleep w/CPAP sometimes"  . Headache 11/13/11    "headaches sometimes; haven't had migraine in long time"  . Chronic kidney disease   . Arthritis   . Anxiety   . Depression   . Type II or unspecified type diabetes mellitus with renal manifestations, uncontrolled 09/21/2008    History  Substance Use Topics  . Smoking status: Former Smoker -- 1.0 packs/day for 15 years    Types: Cigarettes    Quit date: 09/24/1986  . Smokeless tobacco: Never Used  . Alcohol Use: 1.2 oz/week    2 Shots of liquor per week    Family History  Problem Relation Age of Onset  . Asthma Mother   . Hyperlipidemia Mother   . Hypertension Mother   . Stroke Father   . Heart attack Father   . Prostate cancer Father   . Deep vein thrombosis Father   . Cancer Father   . Diabetes Father   . Hyperlipidemia Father   . Hypertension Father   . Other Father     varicose veins  .  Heart disease Father     before age 25  . Other Sister     varicose veins    No Known Allergies  Current outpatient prescriptions:benzonatate (TESSALON) 100 MG capsule, Take 100 mg by mouth 2 (two) times daily as needed. For cough, Disp: , Rfl: ;  calcitRIOL (ROCALTROL) 0.25 MCG capsule, Take 0.25 mcg by mouth daily., Disp: , Rfl: ;  carvedilol (COREG) 25 MG tablet, Take 25 mg by mouth 2 (two) times daily with meals.  , Disp: , Rfl: ;  clopidogrel (PLAVIX) 75 MG tablet, Take 75 mg by mouth daily.  , Disp: , Rfl:  insulin detemir (LEVEMIR) 100 UNIT/ML injection, Inject 75 Units into the skin 2 (two) times daily. Please give pen if covered. 3 month supply., Disp: 10 mL, Rfl: 12;  Insulin Syringe-Needle U-100 25G X 1" 1 ML MISC, 1 applicator by Does not apply route 2 (two) times daily. Please give needles 4 Levemir., Disp: 200 each, Rfl: 0;  isosorbide mononitrate (IMDUR) 60 MG 24 hr tablet, Take 60 mg by mouth daily., Disp: , Rfl:  losartan (COZAAR) 100 MG tablet, Take 1 tablet (100 mg total) by mouth daily. Take one  tablet in the morning and one-half in the evening, Disp: 45 tablet, Rfl: 3;  rosuvastatin (CRESTOR) 40 MG tablet, Take 40 mg by mouth at bedtime., Disp: , Rfl: ;  torsemide (DEMADEX) 100 MG tablet, Take 1.5 tablets (150 mg total) by mouth 2 (two) times daily., Disp: 90 tablet, Rfl: 0 albuterol (PROVENTIL HFA;VENTOLIN HFA) 108 (90 BASE) MCG/ACT inhaler, Inhale 2 puffs into the lungs every 4 (four) hours as needed. For shortness of breath and wheezing, Disp: , Rfl: ;  amLODipine (NORVASC) 10 MG tablet, Take 10 mg by mouth daily.  , Disp: , Rfl: ;  cetirizine (ZYRTEC) 10 MG tablet, Take 10 mg by mouth daily as needed. For nasal congestion, Disp: , Rfl:   BP 127/73  Pulse 92  Resp 16  Ht 5\' 11"  (1.803 m)  Wt 301 lb (136.533 kg)  BMI 41.98 kg/m2  SpO2 97%  Body mass index is 41.98 kg/(m^2).       Review of systems also for shortness of breath with lying flat and with exertion, he  does have swelling of his legs Pulmonary positive for productive cough and we wheezing Neurologic for weakness in his arms and legs and occasional difficulty with speech was slurring and dizziness Otherwise negative  Physical exam: Alert black male in no acute distress, morbidly obese Chest clear bilaterally without wheezing Heart regular rate and rhythm Status 2+ radial pulses bilaterally Abdomen obese with no tenderness Extremities without any cyanosis or edema Neurologically he is grossly intact Skin without ulcers or rashes  Upper extremity vein mapping her office was ordered and interpreted by myself. This does show 3 mm in greater size diameter of cephalic vein in the left arm. Slightly smaller in the right with the 2 mm larger veins. I imaged his veins with the SonoSite and this does appear to have adequate size for cephalic vein fistula on the left:  Impression and plan: Chronic renal insufficiency for discussion of AV access. I discussed hemodialysis catheters, AV fistulas, and AV grafts with the patient. I explained the indications for each of these. I do feel that he has an acceptable size cephalic vein on the left attempted fistula. He is right-handed. I recommend that we proceed with Cimino radiocephalic AV fistula on the left. He understands that the longer this works a better likelihood of long-term use of it. He is unwilling to commit to surgery at this time and will notify should he wish to proceed with left arm fistula creation he understands this is an outpatient procedure

## 2012-05-23 ENCOUNTER — Ambulatory Visit (INDEPENDENT_AMBULATORY_CARE_PROVIDER_SITE_OTHER): Payer: Medicare Other | Admitting: Family Medicine

## 2012-05-23 ENCOUNTER — Encounter: Payer: Self-pay | Admitting: Family Medicine

## 2012-05-23 VITALS — BP 167/97 | HR 89 | Temp 98.6°F | Ht 71.0 in | Wt 311.0 lb

## 2012-05-23 DIAGNOSIS — N184 Chronic kidney disease, stage 4 (severe): Secondary | ICD-10-CM

## 2012-05-23 DIAGNOSIS — E785 Hyperlipidemia, unspecified: Secondary | ICD-10-CM

## 2012-05-23 DIAGNOSIS — I10 Essential (primary) hypertension: Secondary | ICD-10-CM

## 2012-05-23 DIAGNOSIS — N058 Unspecified nephritic syndrome with other morphologic changes: Secondary | ICD-10-CM

## 2012-05-23 DIAGNOSIS — E1129 Type 2 diabetes mellitus with other diabetic kidney complication: Secondary | ICD-10-CM

## 2012-05-23 DIAGNOSIS — R0602 Shortness of breath: Secondary | ICD-10-CM

## 2012-05-23 DIAGNOSIS — I5022 Chronic systolic (congestive) heart failure: Secondary | ICD-10-CM

## 2012-05-23 MED ORDER — ALBUTEROL SULFATE HFA 108 (90 BASE) MCG/ACT IN AERS: 2.0000 | INHALATION_SPRAY | RESPIRATORY_TRACT | Status: DC | PRN

## 2012-05-23 MED ORDER — ISOSORBIDE MONONITRATE ER 60 MG PO TB24: 60.0000 mg | ORAL_TABLET | Freq: Every day | ORAL | Status: DC

## 2012-05-23 NOTE — Assessment & Plan Note (Signed)
Check direct LDL today. Continue statin

## 2012-05-23 NOTE — Assessment & Plan Note (Signed)
FLuid overload could be from CKD. Will check creatinine today. Encourage patient to call Dr. Deterding's office to get an earlier appt. Patient is adamantly against HD, but I did encourage him to reconsider it. Will call with [facility]Met results

## 2012-05-23 NOTE — Assessment & Plan Note (Signed)
Elevated today. Will refill Imdur, and encourage him to take ALL medications as prescribed. Follow up in 2 weeks.

## 2012-05-23 NOTE — Progress Notes (Signed)
Subjective:     Patient ID: Jonathon Bailey, male   DOB: [birth year], 56 y.o.   MRN: [id number]  HPI Patient is 56 yo M follow up appt   Diabetes: Patient does not check his CBG at home. Unsure where his meter is, but thinks he knows High at home: Unknown Low at home: Unknown, but does not feel any lows Taking medications: Yes, but only taking Levemir 160units daily, not BID Side effects: None ROS: denies fever, chills, dizziness, loss of conscieness, polyuria poly dipsia numbness or tingling in extremities or chest pain.   Hypertension: Blood pressure at home: Does not check Blood pressure today: 16797, but states he has not taken all of his medications today Taking Meds: Typically takes daily. On review on his pill bottles, he has Lisinopril 10mg  and not Norvasc, which is different than our med rec Side effects: None ROS: Denies headache visual changes nausea, vomiting, chest pain or abdominal pain or shortness of breath.  Bronchitis: Patient states he has frequent bronchitis. Does not have any fevers, but reports wheezing and cough. Some SOB with exertion. Coughs up some phlegm but typically white. Ran out of inhalers at home and has not used recently. Does not smoke (former smoker)  Fluid: Patient reports a 3 week history of increased fluid. Reports 11lb weight gain recently. Takes Torsemide 150mg  BID. Has recently been seen by renal, and does not seem interested in HD at this time. Reports swelling of lower extremities, and perhaps some swelling of abdomen. No changes in diet. Last labs in our computer are from Feb 2013, and his Cr was >3 at that time. Patient states he is urinating and has not noticed any changes. He has been on Lasix before and noticed a better diuresis (this was in the hospital, most likely IV).  History reviewed: Former smoker Health Maintenance: Needs appt with Lamont Dowdy, life coach  Review of Systems See HPI above    Objective:   Physical Exam    Constitutional: He is oriented to person, place, and time. He appears well-developed and well-nourished. No distress.  HENT:  Head: Normocephalic and atraumatic.  Cardiovascular: Normal rate and regular rhythm.   No murmur heard. Pulmonary/Chest: Effort normal.       No crackles at bases. Good air movement throughout, but notable for diffuse coarseness. No focal findings or wheezes.  Abdominal: Soft. There is no tenderness.       Obese  Musculoskeletal:       2+ pitting edema of lower extremities to mid-calf. Some chronic skin changes also noted  Lymphadenopathy:    He has no cervical adenopathy.  Neurological: He is alert and oriented to person, place, and time. No cranial nerve deficit.  Skin: He is not diaphoretic.   Physical Exam  Constitutional: He is oriented to person, place, and time. He appears well-developed and well-nourished. No distress.  HENT:  Head: Normocephalic and atraumatic.  Cardiovascular: Normal rate and regular rhythm.   No murmur heard. Pulmonary/Chest: Effort normal.       No crackles at bases. Good air movement throughout, but notable for diffuse coarseness. No focal findings or wheezes.  Abdominal: Soft. There is no tenderness.       Obese  Musculoskeletal:       2+ pitting edema of lower extremities to mid-calf. Some chronic skin changes also noted  Lymphadenopathy:    He has no cervical adenopathy.  Neurological: He is alert and oriented to person, place, and time. No cranial nerve  deficit.  Skin: He is not diaphoretic.      Assessment:   56 yo M with HTN, DM, CHF, CKD and bronchitis follow-up appointment    Plan:     See problem list

## 2012-05-23 NOTE — Assessment & Plan Note (Signed)
No signs of crackles or red flags for infection. Will refill Albuterol to take q4hrs prn for wheezing or SOB. If febrile or has sputum production, will reexamine and consider antibiotics.

## 2012-05-23 NOTE — Patient Instructions (Addendum)
It was good to meet you today.  Please stop by the lab to get some lab work to check your kidneys and your diabetes.  Increase your Torsemide to 2 tabs twice per day for now. PLEASE call Dr. Jimmy Footman and be seen sooner! I think this fluid is from your kidneys.  For your lungs, I have refilled your Albuterol. Please use every 4 hours while wheezing.  Leoma Folds M. Adelma Bowdoin, M.D.

## 2012-05-23 NOTE — Assessment & Plan Note (Signed)
A1c today. On Levemir but only taking once daily. Will divide doses to BID. Patient will check CBG daily

## 2012-05-23 NOTE — Assessment & Plan Note (Signed)
Appears fluid overloaded, but unsure if this is from CHF exacerbation or kidney disease. BP slightly elevated as well. Will increase Torsemide to 200mg  BID. Patient to follow up in 2 weeks for weight check.

## 2012-05-24 ENCOUNTER — Encounter: Payer: Self-pay | Admitting: Family Medicine

## 2012-05-24 LAB — BASIC METABOLIC PANEL
CO2: 28 mEq/L (ref 19–32)
Calcium: 9.3 mg/dL (ref 8.4–10.5)
Creat: 3.85 mg/dL — ABNORMAL HIGH (ref 0.50–1.35)
Glucose, Bld: 115 mg/dL — ABNORMAL HIGH (ref 70–99)
Sodium: 142 mEq/L (ref 135–145)

## 2012-06-26 ENCOUNTER — Other Ambulatory Visit: Payer: Self-pay | Admitting: Family Medicine

## 2012-06-26 MED ORDER — TORSEMIDE 100 MG PO TABS: 150.0000 mg | ORAL_TABLET | Freq: Two times a day (BID) | ORAL | Status: DC

## 2012-06-26 NOTE — Telephone Encounter (Signed)
Patient informed expressed understanding. 

## 2012-06-26 NOTE — Telephone Encounter (Signed)
Rx refilled. Thank you! Alnita Aybar M. Rona Tomson, M.D.

## 2012-06-26 NOTE — Telephone Encounter (Signed)
Patient is calling for a refill on his Torsemide to go to [place] on [address].  He is leaving on a train tomorrow and needs this tonight.

## 2012-08-04 ENCOUNTER — Encounter: Payer: Self-pay | Admitting: Home Health Services

## 2012-08-05 ENCOUNTER — Encounter: Payer: Self-pay | Admitting: Home Health Services

## 2012-08-11 ENCOUNTER — Emergency Department (HOSPITAL_COMMUNITY): Payer: Medicare Other

## 2012-08-11 ENCOUNTER — Inpatient Hospital Stay (HOSPITAL_COMMUNITY)
Admission: EM | Admit: 2012-08-11 | Discharge: 2012-08-13 | DRG: 292 | Disposition: A | Discharge status: Home / Self Care | Payer: Medicare Other | Attending: Family Medicine | Admitting: Family Medicine

## 2012-08-11 ENCOUNTER — Encounter (HOSPITAL_COMMUNITY): Payer: Self-pay | Admitting: Adult Health

## 2012-08-11 DIAGNOSIS — I10 Essential (primary) hypertension: Secondary | ICD-10-CM | POA: Diagnosis present

## 2012-08-11 DIAGNOSIS — Z87891 Personal history of nicotine dependence: Secondary | ICD-10-CM

## 2012-08-11 DIAGNOSIS — E669 Obesity, unspecified: Secondary | ICD-10-CM | POA: Diagnosis present

## 2012-08-11 DIAGNOSIS — Z91199 Patient's noncompliance with other medical treatment and regimen due to unspecified reason: Secondary | ICD-10-CM

## 2012-08-11 DIAGNOSIS — E1165 Type 2 diabetes mellitus with hyperglycemia: Secondary | ICD-10-CM | POA: Diagnosis present

## 2012-08-11 DIAGNOSIS — Z9861 Coronary angioplasty status: Secondary | ICD-10-CM

## 2012-08-11 DIAGNOSIS — E1142 Type 2 diabetes mellitus with diabetic polyneuropathy: Secondary | ICD-10-CM | POA: Diagnosis present

## 2012-08-11 DIAGNOSIS — Z79899 Other long term (current) drug therapy: Secondary | ICD-10-CM

## 2012-08-11 DIAGNOSIS — I2510 Atherosclerotic heart disease of native coronary artery without angina pectoris: Secondary | ICD-10-CM | POA: Diagnosis present

## 2012-08-11 DIAGNOSIS — D631 Anemia in chronic kidney disease: Secondary | ICD-10-CM | POA: Diagnosis present

## 2012-08-11 DIAGNOSIS — K5900 Constipation, unspecified: Secondary | ICD-10-CM | POA: Diagnosis present

## 2012-08-11 DIAGNOSIS — N058 Unspecified nephritic syndrome with other morphologic changes: Secondary | ICD-10-CM | POA: Diagnosis present

## 2012-08-11 DIAGNOSIS — I252 Old myocardial infarction: Secondary | ICD-10-CM

## 2012-08-11 DIAGNOSIS — Z8701 Personal history of pneumonia (recurrent): Secondary | ICD-10-CM

## 2012-08-11 DIAGNOSIS — E1149 Type 2 diabetes mellitus with other diabetic neurological complication: Secondary | ICD-10-CM | POA: Diagnosis present

## 2012-08-11 DIAGNOSIS — E785 Hyperlipidemia, unspecified: Secondary | ICD-10-CM | POA: Diagnosis present

## 2012-08-11 DIAGNOSIS — Z992 Dependence on renal dialysis: Secondary | ICD-10-CM | POA: Diagnosis present

## 2012-08-11 DIAGNOSIS — I129 Hypertensive chronic kidney disease with stage 1 through stage 4 chronic kidney disease, or unspecified chronic kidney disease: Secondary | ICD-10-CM | POA: Diagnosis present

## 2012-08-11 DIAGNOSIS — F3289 Other specified depressive episodes: Secondary | ICD-10-CM | POA: Diagnosis present

## 2012-08-11 DIAGNOSIS — N184 Chronic kidney disease, stage 4 (severe): Secondary | ICD-10-CM | POA: Diagnosis present

## 2012-08-11 DIAGNOSIS — N039 Chronic nephritic syndrome with unspecified morphologic changes: Secondary | ICD-10-CM | POA: Diagnosis present

## 2012-08-11 DIAGNOSIS — M129 Arthropathy, unspecified: Secondary | ICD-10-CM | POA: Diagnosis present

## 2012-08-11 DIAGNOSIS — F329 Major depressive disorder, single episode, unspecified: Secondary | ICD-10-CM | POA: Diagnosis present

## 2012-08-11 DIAGNOSIS — R471 Dysarthria and anarthria: Secondary | ICD-10-CM | POA: Diagnosis present

## 2012-08-11 DIAGNOSIS — Z794 Long term (current) use of insulin: Secondary | ICD-10-CM

## 2012-08-11 DIAGNOSIS — I5023 Acute on chronic systolic (congestive) heart failure: Principal | ICD-10-CM | POA: Diagnosis present

## 2012-08-11 DIAGNOSIS — Z9119 Patient's noncompliance with other medical treatment and regimen: Secondary | ICD-10-CM

## 2012-08-11 DIAGNOSIS — F411 Generalized anxiety disorder: Secondary | ICD-10-CM | POA: Diagnosis present

## 2012-08-11 DIAGNOSIS — I509 Heart failure, unspecified: Secondary | ICD-10-CM

## 2012-08-11 DIAGNOSIS — R51 Headache: Secondary | ICD-10-CM | POA: Diagnosis present

## 2012-08-11 DIAGNOSIS — Z6841 Body Mass Index (BMI) 40.0 and over, adult: Secondary | ICD-10-CM

## 2012-08-11 DIAGNOSIS — G4733 Obstructive sleep apnea (adult) (pediatric): Secondary | ICD-10-CM | POA: Diagnosis present

## 2012-08-11 DIAGNOSIS — Z8673 Personal history of transient ischemic attack (TIA), and cerebral infarction without residual deficits: Secondary | ICD-10-CM

## 2012-08-11 DIAGNOSIS — E118 Type 2 diabetes mellitus with unspecified complications: Secondary | ICD-10-CM | POA: Diagnosis present

## 2012-08-11 LAB — CBC
Hemoglobin: 8.4 g/dL — ABNORMAL LOW (ref 13.0–17.0)
MCH: 26.8 pg (ref 26.0–34.0)
MCHC: 31.7 g/dL (ref 30.0–36.0)

## 2012-08-11 LAB — BASIC METABOLIC PANEL
Calcium: 9.1 mg/dL (ref 8.4–10.5)
GFR calc non Af Amer: 15 mL/min — ABNORMAL LOW (ref >90)
Glucose, Bld: 349 mg/dL — ABNORMAL HIGH (ref 70–99)
Sodium: 135 mEq/L (ref 135–145)

## 2012-08-11 LAB — POCT I-STAT TROPONIN I: Troponin i, poc: 0.03 ng/mL (ref 0.00–0.08)

## 2012-08-11 LAB — TROPONIN I: Troponin I: <0.3 ng/mL (ref <0.30)

## 2012-08-11 MED ORDER — SODIUM CHLORIDE 0.9 % IJ SOLN
3.0000 mL | Freq: Two times a day (BID) | INTRAMUSCULAR | Status: DC
  Administered 2012-08-11 – 2012-08-13 (×3): 3 mL via INTRAVENOUS

## 2012-08-11 MED ORDER — DOCUSATE SODIUM 100 MG PO CAPS
100.0000 mg | ORAL_CAPSULE | Freq: Two times a day (BID) | ORAL | Status: DC
  Administered 2012-08-11 – 2012-08-13 (×4): 100 mg via ORAL
  Filled 2012-08-11 (×5): qty 1

## 2012-08-11 MED ORDER — CALCITRIOL 0.25 MCG PO CAPS
0.2500 ug | ORAL_CAPSULE | Freq: Every day | ORAL | Status: DC
  Administered 2012-08-12 – 2012-08-13 (×2): 0.25 ug via ORAL
  Filled 2012-08-11 (×2): qty 1

## 2012-08-11 MED ORDER — ENOXAPARIN SODIUM 30 MG/0.3ML ~~LOC~~ SOLN
30.0000 mg | SUBCUTANEOUS | Status: DC
  Administered 2012-08-11 – 2012-08-12 (×2): 30 mg via SUBCUTANEOUS
  Filled 2012-08-11 (×3): qty 0.3

## 2012-08-11 MED ORDER — ISOSORBIDE MONONITRATE ER 60 MG PO TB24
60.0000 mg | ORAL_TABLET | Freq: Every day | ORAL | Status: DC
  Administered 2012-08-12 – 2012-08-13 (×2): 60 mg via ORAL
  Filled 2012-08-11 (×2): qty 1

## 2012-08-11 MED ORDER — CARVEDILOL 25 MG PO TABS
25.0000 mg | ORAL_TABLET | Freq: Two times a day (BID) | ORAL | Status: DC
  Administered 2012-08-12 – 2012-08-13 (×4): 25 mg via ORAL
  Filled 2012-08-11 (×5): qty 1

## 2012-08-11 MED ORDER — SODIUM CHLORIDE 0.9 % IV SOLN: 250.0000 mL | INTRAVENOUS | Status: DC | PRN

## 2012-08-11 MED ORDER — CLOPIDOGREL BISULFATE 75 MG PO TABS
75.0000 mg | ORAL_TABLET | Freq: Every day | ORAL | Status: DC
  Administered 2012-08-12 – 2012-08-13 (×2): 75 mg via ORAL
  Filled 2012-08-11 (×2): qty 1

## 2012-08-11 MED ORDER — INSULIN ASPART 100 UNIT/ML ~~LOC~~ SOLN
0.0000 [IU] | Freq: Every day | SUBCUTANEOUS | Status: DC
  Administered 2012-08-12: 4 [IU] via SUBCUTANEOUS

## 2012-08-11 MED ORDER — LORATADINE 10 MG PO TABS
10.0000 mg | ORAL_TABLET | Freq: Every day | ORAL | Status: DC
  Administered 2012-08-12 – 2012-08-13 (×2): 10 mg via ORAL
  Filled 2012-08-11 (×2): qty 1

## 2012-08-11 MED ORDER — ASPIRIN EC 81 MG PO TBEC
81.0000 mg | DELAYED_RELEASE_TABLET | Freq: Every day | ORAL | Status: DC
  Administered 2012-08-11 – 2012-08-13 (×3): 81 mg via ORAL
  Filled 2012-08-11 (×3): qty 1

## 2012-08-11 MED ORDER — NITROGLYCERIN IN D5W 200-5 MCG/ML-% IV SOLN
5.0000 ug/min | Freq: Once | INTRAVENOUS | Status: AC
  Administered 2012-08-11: 5 ug/min via INTRAVENOUS
  Filled 2012-08-11: qty 250

## 2012-08-11 MED ORDER — INSULIN ASPART 100 UNIT/ML ~~LOC~~ SOLN
0.0000 [IU] | Freq: Three times a day (TID) | SUBCUTANEOUS | Status: DC
  Administered 2012-08-12: 3 [IU] via SUBCUTANEOUS
  Administered 2012-08-12: 7 [IU] via SUBCUTANEOUS
  Administered 2012-08-12: 1 [IU] via SUBCUTANEOUS
  Administered 2012-08-13: 5 [IU] via SUBCUTANEOUS
  Administered 2012-08-13: 3 [IU] via SUBCUTANEOUS
  Administered 2012-08-13: 7 [IU] via SUBCUTANEOUS

## 2012-08-11 MED ORDER — ALBUTEROL SULFATE HFA 108 (90 BASE) MCG/ACT IN AERS: 2.0000 | INHALATION_SPRAY | RESPIRATORY_TRACT | Status: DC | PRN

## 2012-08-11 MED ORDER — INSULIN GLARGINE 100 UNIT/ML ~~LOC~~ SOLN
10.0000 [IU] | Freq: Every day | SUBCUTANEOUS | Status: DC
  Administered 2012-08-11 – 2012-08-12 (×2): 10 [IU] via SUBCUTANEOUS

## 2012-08-11 MED ORDER — ACETAMINOPHEN 650 MG RE SUPP: 650.0000 mg | Freq: Four times a day (QID) | RECTAL | Status: DC | PRN

## 2012-08-11 MED ORDER — POLYETHYLENE GLYCOL 3350 17 G PO PACK
17.0000 g | PACK | Freq: Two times a day (BID) | ORAL | Status: DC | PRN
  Filled 2012-08-11: qty 1

## 2012-08-11 MED ORDER — ACETAMINOPHEN 325 MG PO TABS: 650.0000 mg | ORAL_TABLET | Freq: Four times a day (QID) | ORAL | Status: DC | PRN

## 2012-08-11 MED ORDER — FUROSEMIDE 10 MG/ML IJ SOLN: 80.0000 mg | Freq: Two times a day (BID) | INTRAMUSCULAR | Status: DC

## 2012-08-11 NOTE — ED Notes (Signed)
[facility] to [phone]

## 2012-08-11 NOTE — ED Provider Notes (Signed)
History     CSN: [id number]  Arrival date & time 08/11/12  65   First MD Initiated Contact with Patient 08/11/12 1834      Chief Complaint  Patient presents with  . Shortness of Breath    (Consider location/radiation/quality/duration/timing/severity/associated sxs/prior treatment) Patient is a 56 y.o. male presenting with shortness of breath. The history is provided by the patient.  Shortness of Breath  Associated symptoms include shortness of breath.   patient here with shortness of breath for 2 weeks that has gradually been getting worse. History of CHF and current symptoms are similar. Sharp chest pain is also worse with exertion and lying flat. Notes orthopnea and dyspnea exertion. Increased lower extremity edema. Has been using his Demadex without relief.  Past Medical History  Diagnosis Date  . Diabetes mellitus   . Hyperlipidemia   . Hypertension   . Myocardial infarction     status post MI x2 and 3 stents placed in 2003  . CHF (congestive heart failure)   . Stroke ~ 2007; ~1987    "weak on right side; messed w/right side of brain; cry all the time"  . Bronchitis 11/13/11    "last time was last week"  . Shortness of breath on exertion   . Orthopnea 11/13/11    "been sleeping in chair last 12 days"  . Sleep apnea     "sleep w/CPAP sometimes"  . Headache 11/13/11    "headaches sometimes; haven't had migraine in long time"  . Chronic kidney disease   . Arthritis   . Anxiety   . Depression   . Type II or unspecified type diabetes mellitus with renal manifestations, uncontrolled(250.42) 09/21/2008    Past Surgical History  Procedure Date  . Coronary stent placement   . Hernia repair     umbilical  . Coronary angioplasty with stent placement ~ 2002    "3"    Family History  Problem Relation Age of Onset  . Asthma Mother   . Hyperlipidemia Mother   . Hypertension Mother   . Stroke Father   . Heart attack Father   . Prostate cancer Father   . Deep vein  thrombosis Father   . Cancer Father   . Diabetes Father   . Hyperlipidemia Father   . Hypertension Father   . Other Father     varicose veins  . Heart disease Father     before age 87  . Other Sister     varicose veins    History  Substance Use Topics  . Smoking status: Former Smoker -- 1.0 packs/day for 15 years    Types: Cigarettes    Quit date: 09/24/1986  . Smokeless tobacco: Never Used  . Alcohol Use: 1.2 oz/week    2 Shots of liquor per week      Review of Systems  Respiratory: Positive for shortness of breath.   All other systems reviewed and are negative.    Allergies  Review of patient's allergies indicates no known allergies.  Home Medications   Current Outpatient Rx  Name  Route  Sig  Dispense  Refill  . ALBUTEROL SULFATE HFA 108 (90 BASE) MCG/ACT IN AERS   Inhalation   Inhale 2 puffs into the lungs every 4 (four) hours as needed. For shortness of breath and wheezing   1 Inhaler   2   . BENZONATATE 100 MG PO CAPS   Oral   Take 100 mg by mouth 2 (two) times daily as needed.  For cough         . CALCITRIOL 0.25 MCG PO CAPS   Oral   Take 0.25 mcg by mouth daily.         Marland Kitchen CARVEDILOL 25 MG PO TABS   Oral   Take 25 mg by mouth 2 (two) times daily with meals.           Marland [employer] CETIRIZINE HCL 10 MG PO TABS   Oral   Take 10 mg by mouth daily as needed. For nasal congestion         . CLOPIDOGREL BISULFATE 75 MG PO TABS   Oral   Take 75 mg by mouth daily.          . INSULIN DETEMIR 100 UNIT/ML [place] SOLN   Subcutaneous   Inject 75 Units into the skin 2 (two) times daily. Please give pen if covered. 3 month supply.   10 mL   12   . INSULIN SYRINGE-NEEDLE U-100 25G X 1" 1 ML MISC   Does not apply   1 applicator by Does not apply route 2 (two) times daily. Please give needles 4 Levemir.   200 each   0   . ISOSORBIDE MONONITRATE ER 60 MG PO TB24   Oral   Take 1 tablet (60 mg total) by mouth daily.   30 tablet   5   . LISINOPRIL 20 MG PO  TABS   Oral   Take 20 mg by mouth daily.         [employer] LOSARTAN POTASSIUM 100 MG PO TABS   Oral   Take 100 mg by mouth 2 (two) times daily.         Marland Kitchen ROSUVASTATIN CALCIUM 40 MG PO TABS   Oral   Take 40 mg by mouth at bedtime.         . TORSEMIDE 100 MG PO TABS   Oral   Take 1.5 tablets (150 mg total) by mouth 2 (two) times daily.   90 tablet   0     BP 122/68  Pulse 90  Temp 98.8 F (37.1 C) (Oral)  Resp 18  Ht 6' (1.829 m)  Wt 320 lb (145.151 kg)  BMI 43.40 kg/m2  SpO2 98%  Physical Exam  Nursing note and vitals reviewed. Constitutional: He is oriented to person, place, and time. He appears well-developed and well-nourished.  Non-toxic appearance. No distress.  HENT:  Head: Normocephalic and atraumatic.  Eyes: Conjunctivae normal, EOM and lids are normal. Pupils are equal, round, and reactive to light.  Neck: Normal range of motion. Neck supple. No tracheal deviation present. No mass present.  Cardiovascular: Normal rate, regular rhythm and normal heart sounds.  Exam reveals no gallop.   No murmur heard. Pulmonary/Chest: No stridor. Tachypnea noted. No respiratory distress. He has decreased breath sounds. He has no wheezes. He has rhonchi. He has no rales.  Abdominal: Soft. Normal appearance and bowel sounds are normal. He exhibits no distension. There is no tenderness. There is no rebound and no CVA tenderness.  Musculoskeletal: Normal range of motion. He exhibits no edema and no tenderness.       Bilateral lower extremity edema  Neurological: He is alert and oriented to person, place, and time. He has normal strength. No cranial nerve deficit or sensory deficit. GCS eye subscore is 4. GCS verbal subscore is 5. GCS motor subscore is 6.  Skin: Skin is warm and dry. No abrasion and no rash noted.  Psychiatric: He  has a normal mood and affect. His speech is normal and behavior is normal.    ED Course  Procedures (including critical care time)  Labs Reviewed  CBC -  Abnormal; Notable for the following:    RBC 3.14 (*)     Hemoglobin 8.4 (*)     HCT 26.5 (*)     All other components within normal limits  BASIC METABOLIC PANEL - Abnormal; Notable for the following:    Glucose, Bld 349 (*)     BUN 32 (*)     Creatinine, Ser 4.19 (*)     GFR calc non Af Amer 15 (*)     GFR calc Af Amer 17 (*)     All other components within normal limits  PRO B NATRIURETIC PEPTIDE - Abnormal; Notable for the following:    Pro B Natriuretic peptide (BNP) 4441.0 (*)     All other components within normal limits  POCT I-STAT TROPONIN I   Dg Chest 2 View  08/11/2012  *RADIOLOGY REPORT*  Clinical Data: Shortness of breath, cough  CHEST - 2 VIEW  Comparison: 11/12/2011  Findings: Mild cardiac enlargement noted with vascular and interstitial prominence.  Fissural thickening on the lateral view. Suspect early interstitial edema pattern.  Small effusions noted. No pneumothorax.  Trachea is midline.  IMPRESSION: Cardiomegaly with slight worsening interstitial pattern and small effusions, suspect early CHF.   Original Report Authenticated By: Jerilynn Mages. Annamaria Boots, M.D.      No diagnosis found.    MDM  Patient with CHF. He is to be admitted. We'll start nitroglycerin drip   Date: 08/11/2012  Rate: 122  Rhythm: sinus tachycardia  QRS Axis: normal  Intervals: normal  ST/T Wave abnormalities: nonspecific ST changes  Conduction Disutrbances:none  Narrative Interpretation:   Old EKG Reviewed: none available          Leota Jacobsen, MD 08/11/12 1851

## 2012-08-11 NOTE — ED Notes (Signed)
Presents with 2 weeks of SOB intermitent chest pain described as sharp that is worse with exertion, lying flat. Bilateral pedal edema. History of CHF. Has been sleeping sitting up in a  Chair for the last week and half. Able to speak in short phrases. Sats 96% RA

## 2012-08-11 NOTE — H&P (Signed)
[facility] Admission History and Physical Service Pager: [phone]  Patient name: Jonathon Bailey Medical record number: [id number] Date of birth: Mar 16, 1956 Age: 56 y.o. Gender: male  Primary Care Provider: Melrose Nakayama, MD Cardiologist - Dr Claiborne Billings at [facility].  Last time saw him was 3 months ago. Nephrologist - Dr Pete Glatter - has appointment to see him 25th.  Chief Complaint: CHF exacerbation  Assessment and Plan: Jonathon Bailey is a 56 y.o. year old male with history of uncontrolled DM Type II, systolic CHF, HLD, OSA, obesity, HTN, and stage 4 CKD presenting with a CHF exacerbation.  1. CHF exacerbation - Patient with reported weight gain, edema, difficulty breathing and orthopnea, elevated BNP, and CXR findings supportive of CHF exacerbation, likely due to medication noncompliance over the past few weeks.  EKG with sinus tachycardia and no ST elevations. - Admit to [facility] Service, attending Dr. Erin Hearing, to telemetry bed - Diurese with lasix 80mg  IV q12 hours (home regimen was Torsemide 150 mg BID) - BMET in AM to follow creatinine - Cycle troponins (first set negative) - Consider echocardiogram tomorrow - Strict I:O and daily weights - Let patient's outpatient cardiologist know he is here (Dr. Claiborne Billings at [facility]) - Continuous pulse oximetry with O2 by [place] prn oxygen desaturations <92%  - PT/OT consults ordered  2. CKD, stage IV - Cr worsened from baseline, likely due to diuretic and dose increase in recent past.  Patient refuses dialysis.  Creatinine today 4.1 (baseline appears ~3.0) - Hold losartan and lisinopril, which patient is on at home - BMET in AM  3. HTN - Stable, BP - Continue home coreg 25mg  po BID, imdur 24hr tablet 60mg  daily.   - Hold losartan and lisinopril in setting of acute on CKD  4. H/o CAD - Continue plavix 75mg  daily and aspirin 81mg  daily  5. Dyspnea - Likely related to obesity, OSA and CHF -  Continue PRN albuterol, which patient states he has not needed lately at home  6. DM Type II - Last A1c 10.7 in August.  On levemir 75 unit BID at home - hold for now - sensitive SSI and lantus 10 units daily - Hemoglobin A1c now as patient has poor outpatient follow-up - Toes appear in poor health - re-examine tomorrow and consider topical medication/cleaning  7. Anemia of chronic disease - Hemoglobin 8.4 today, baseline appears to be around 9-10.  Likely lower due to worsening CKD - CBC in AM  FEN/GI -  F: Saline lock IV in patient with heart failure E: All wnl other than elevated BUN and Cr; BMET in AM N: Renal and carb modified diet GI: Constipation with last BM yesterday that was small and hard - Miralax and colace prn  Prophylaxis - Lovenox subcutaneously daily  Code Status - Discussed with patient - Full code  Disposition - Admit to telemetry; monitor for improvement in fluid status and pending cardiac workup   History of Present Illness:Jonathon Bailey is a 56 y.o. year old male with history of uncontrolled DM Type II, systolic CHF, HLD, OSA, obesity, HTN, and stage 4 CKD presenting with a CHF exacerbation.  Per Jonathon Bailey, he started having increased leg swelling 2-3 months ago and 2.5 weeks ago he noticed worsening of breath, especially if going up stairs or bending over in shower.  He cannot lay down and must be in seated position to sleep.  Prior to 2.5 weeks ago, he did not have these symptoms.  Denies chest pain currently, but when he lays down and gets short of breath he does have pain just below his neck.  He reports productive cough, leg pain due to swelling, increased weight from baseline 295 lbs, and chills but no fevers.  He usually has significant edema all over with CHF exacerbations.    Jonathon Bailey states that at home, he was taking torsemide as directed, which he did not think was working, until 4 days ago when he ran out of medications.  He did not call Dr. Sheral Apley  when he ran out of medications or was having these symptoms, because he knew he would be sent to the hospital.  He thinks lasix has worked much better for him in the past than torsemide.    Of note, patient sees his cardiologist, Dr. Claiborne Billings, every 3-4 months.  Patient admits to not adhering to a low sodium diet.     Patient Active Problem List  Diagnosis  . Diabetes mellitus out of control  . DIABETIC PERIPHERAL NEUROPATHY  . HYPERLIPIDEMIA  . ERECTILE DYSFUNCTION  . OBSTRUCTIVE SLEEP APNEA  . BLURRED VISION  . ESSENTIAL HYPERTENSION  . CORONARY ARTERY DISEASE  . CHRONIC SYSTOLIC HEART FAILURE  . STROKE  . PNEUMOCOCCAL PNEUMONIA  . CKD (chronic kidney disease) stage 4, GFR 15-29 ml/min  . INGROWN NAIL  . SHOULDER PAIN, RIGHT  . DIZZINESS  . HEADACHE  . DYSARTHRIA  . DYSPNEA  . CEREBROVASCULAR ACCIDENT, HX OF  . Persistent cough  . Chest pain  . Bronchospasm with bronchitis, acute  . Shortness of breath dyspnea  . End stage renal disease   Past Medical History: Past Medical History  Diagnosis Date  . Diabetes mellitus   . Hyperlipidemia   . Hypertension   . Myocardial infarction     status post MI x2 and 3 stents placed in 2003  . CHF (congestive heart failure)   . Stroke ~ 2007; ~1987    "weak on right side; messed w/right side of brain; cry all the time"  . Bronchitis 11/13/11    "last time was last week"  . Shortness of breath on exertion   . Orthopnea 11/13/11    "been sleeping in chair last 12 days"  . Sleep apnea     "sleep w/CPAP sometimes"  . Headache 11/13/11    "headaches sometimes; haven't had migraine in long time"  . Chronic kidney disease   . Arthritis   . Anxiety   . Depression   . Type II or unspecified type diabetes mellitus with renal manifestations, uncontrolled(250.42) 09/21/2008   Past Surgical History: Past Surgical History  Procedure Date  . Coronary stent placement   . Hernia repair     umbilical  . Coronary angioplasty with stent  placement ~ 2002    "3"   Social History: History  Substance Use Topics  . Smoking status: Former Smoker -- 1.0 packs/day for 15 years    Types: Cigarettes    Quit date: 09/24/1986  . Smokeless tobacco: Never Used  . Alcohol Use: 1.2 oz/week    2 Shots of liquor per week  Lives with significant other and 56 yo son in [place]. Disabled.   Stopped smoking 25-30 years ago. 1-2 drinks/week Does not cook with salt or add salt to foods, but eats salty foods.     For any additional social history documentation, please refer to relevant sections of EMR.  Family History: Family History  Problem Relation Age of Onset  .  Asthma Mother   . Hyperlipidemia Mother   . Hypertension Mother   . Stroke Father   . Heart attack Father   . Prostate cancer Father   . Deep vein thrombosis Father   . Cancer Father   . Diabetes Father   . Hyperlipidemia Father   . Hypertension Father   . Other Father     varicose veins  . Heart disease Father     before age 63  . Other Sister     varicose veins   Allergies: No Known Allergies No current facility-administered medications on file prior to encounter.   Current Outpatient Prescriptions on File Prior to Encounter  Medication Sig Dispense Refill  . albuterol (PROVENTIL HFA;VENTOLIN HFA) 108 (90 BASE) MCG/ACT inhaler Inhale 2 puffs into the lungs every 4 (four) hours as needed. For shortness of breath and wheezing  1 Inhaler  2  . benzonatate (TESSALON) 100 MG capsule Take 100 mg by mouth 2 (two) times daily as needed. For cough      . calcitRIOL (ROCALTROL) 0.25 MCG capsule Take 0.25 mcg by mouth daily.      . carvedilol (COREG) 25 MG tablet Take 25 mg by mouth 2 (two) times daily with meals.        . cetirizine (ZYRTEC) 10 MG tablet Take 10 mg by mouth daily as needed. For nasal congestion      . clopidogrel (PLAVIX) 75 MG tablet Take 75 mg by mouth daily.       . insulin detemir (LEVEMIR) 100 UNIT/ML injection Inject 75 Units into the skin 2  (two) times daily. Please give pen if covered. 3 month supply.  10 mL  12  . Insulin Syringe-Needle U-100 25G X 1" 1 ML MISC 1 applicator by Does not apply route 2 (two) times daily. Please give needles 4 Levemir.  200 each  0  . isosorbide mononitrate (IMDUR) 60 MG 24 hr tablet Take 1 tablet (60 mg total) by mouth daily.  30 tablet  5  . lisinopril (PRINIVIL,ZESTRIL) 20 MG tablet Take 20 mg by mouth daily.      . rosuvastatin (CRESTOR) 40 MG tablet Take 40 mg by mouth at bedtime.      . torsemide (DEMADEX) 100 MG tablet Take 1.5 tablets (150 mg total) by mouth 2 (two) times daily.  90 tablet  0   Review Of Systems: Per HPI with the following additions: Constipation that patient feels is related to abdominal hernia.  Last BM yesterday was hard and small.  Today only passing gas.  Otherwise 12 point review of systems was performed and was unremarkable.  Physical Exam: BP 122/68  Pulse 90  Temp 98.8 F (37.1 C) (Oral)  Resp 18  Ht 6' (1.829 m)  Wt 320 lb (145.151 kg)  BMI 43.40 kg/m2  SpO2 98% Exam: General: Obese male lying in bed in NAD HEENT: AT/[place], sclera clear, EOMI, MMM, o/p clear Cardiovascular: Distant heart sounds but RRR Respiratory: coarse expiratory breath sounds throughout with decreased breath movement bilateral bases and poor effort, mild dyspnea at rest Abdomen: obese, mildly tender diffusely due to tightness, moderately distended, no organomegaly appreciated, but limited by habitus; Hernia repair midline under umbilicus; no back or sacral pitting edema; hypoactive bowel sounds Extremities: Atraumatic; bilateral LE 2+ pitting edema to knees; toenails starting to come off with some areas that appear scabbed/with dried blood; diminished bilateral distal pulses due to edema Skin: No rashes or cyanosis  Neuro: Alert, oriented, no focal deficits,  speech normal  Labs and Imaging:  CBC    Component Value Date/Time   WBC 10.2 08/11/2012 1541   RBC 3.14* 08/11/2012 1541   HGB  8.4* 08/11/2012 1541   HCT 26.5* 08/11/2012 1541   PLT 203 08/11/2012 1541   MCV 84.4 08/11/2012 1541   MCH 26.8 08/11/2012 1541   MCHC 31.7 08/11/2012 1541   RDW 13.9 08/11/2012 1541   LYMPHSABS 3.5 11/07/2011 1214   MONOABS 1.1* 11/07/2011 1214   EOSABS 0.5 11/07/2011 1214   BASOSABS 0.0 11/07/2011 1214   CMP     Component Value Date/Time   NA 135 08/11/2012 1541   K 4.8 08/11/2012 1541   CL 104 08/11/2012 1541   CO2 20 08/11/2012 1541   GLUCOSE 349* 08/11/2012 1541   BUN 32* 08/11/2012 1541   CREATININE 4.19* 08/11/2012 1541        CALCIUM 9.1 08/11/2012 1541                                 GFRNONAA 15* 08/11/2012 1541   GFRAA 17* 08/11/2012 1541     Urinalysis    Component Value Date/Time   COLORURINE YELLOW 02/09/2010 1430   APPEARANCEUR CLEAR 02/09/2010 1430   LABSPEC 1.012 02/09/2010 1430   PHURINE 6.0 02/09/2010 1430   GLUCOSEU NEGATIVE 02/09/2010 1430   HGBUR NEGATIVE 02/09/2010 1430   BILIRUBINUR NEGATIVE 02/09/2010 1430   KETONESUR NEGATIVE 02/09/2010 1430   PROTEINUR >300* 02/09/2010 1430   UROBILINOGEN 1.0 02/09/2010 1430   NITRITE NEGATIVE 02/09/2010 1430   LEUKOCYTESUR NEGATIVE 02/09/2010 1430   Chest xray: IMPRESSION:  Cardiomegaly with slight worsening interstitial pattern and small  effusions, suspect early CHF.  ProBNP 4441  EKG - Sinus tachycardia  Troponin i POC 0.03   Conni Slipper, MD 08/11/2012, 7:24 PM  PGY-3 Addendum:  Patient seen and examined independently.  Available data reviewed. Agree with findings, assessment, and plan as outlined by Dr. Dianah Field.  My additional findings are documented and highlighted above.  [place], DO 08/11/2012 9:27 PM

## 2012-08-12 ENCOUNTER — Inpatient Hospital Stay (HOSPITAL_COMMUNITY): Payer: Medicare Other

## 2012-08-12 DIAGNOSIS — I5023 Acute on chronic systolic (congestive) heart failure: Secondary | ICD-10-CM

## 2012-08-12 LAB — CBC
HCT: 25.6 % — ABNORMAL LOW (ref 39.0–52.0)
Platelets: 240 10*3/uL (ref 150–400)
RDW: 13.9 % (ref 11.5–15.5)
WBC: 8.8 10*3/uL (ref 4.0–10.5)

## 2012-08-12 LAB — BASIC METABOLIC PANEL
Chloride: 110 mEq/L (ref 96–112)
Creatinine, Ser: 4.4 mg/dL — ABNORMAL HIGH (ref 0.50–1.35)
GFR calc Af Amer: 16 mL/min — ABNORMAL LOW (ref >90)

## 2012-08-12 LAB — GLUCOSE, CAPILLARY
Glucose-Capillary: 94 mg/dL (ref 70–99)
Glucose-Capillary: 140 mg/dL — ABNORMAL HIGH (ref 70–99)
Glucose-Capillary: 233 mg/dL — ABNORMAL HIGH (ref 70–99)

## 2012-08-12 LAB — MRSA PCR SCREENING: MRSA by PCR: POSITIVE — AB

## 2012-08-12 LAB — HEMOGLOBIN A1C: Hgb A1c MFr Bld: 9.9 % — ABNORMAL HIGH (ref <5.7)

## 2012-08-12 LAB — TROPONIN I: Troponin I: <0.3 ng/mL (ref <0.30)

## 2012-08-12 MED ORDER — BACITRACIN ZINC 500 UNIT/GM EX OINT
TOPICAL_OINTMENT | Freq: Two times a day (BID) | CUTANEOUS | Status: DC
  Administered 2012-08-12 – 2012-08-13 (×2): via TOPICAL
  Filled 2012-08-12 (×2): qty 15

## 2012-08-12 MED ORDER — HYDRALAZINE HCL 20 MG/ML IJ SOLN
5.0000 mg | INTRAMUSCULAR | Status: DC | PRN
  Filled 2012-08-12: qty 0.25

## 2012-08-12 MED ORDER — CHLORHEXIDINE GLUCONATE CLOTH 2 % EX PADS
6.0000 | MEDICATED_PAD | Freq: Every day | CUTANEOUS | Status: DC
  Administered 2012-08-12 – 2012-08-13 (×2): 6 via TOPICAL

## 2012-08-12 MED ORDER — POLYETHYLENE GLYCOL 3350 17 G PO PACK
17.0000 g | PACK | Freq: Two times a day (BID) | ORAL | Status: DC
  Administered 2012-08-12: 17 g via ORAL
  Filled 2012-08-12 (×4): qty 1

## 2012-08-12 MED ORDER — FUROSEMIDE 10 MG/ML IJ SOLN
80.0000 mg | Freq: Two times a day (BID) | INTRAMUSCULAR | Status: DC
  Administered 2012-08-12 – 2012-08-13 (×4): 80 mg via INTRAVENOUS
  Filled 2012-08-12 (×5): qty 8

## 2012-08-12 MED ORDER — MUPIROCIN 2 % EX OINT
1.0000 "application " | TOPICAL_OINTMENT | Freq: Two times a day (BID) | CUTANEOUS | Status: DC
  Administered 2012-08-12 – 2012-08-13 (×3): 1 via NASAL
  Filled 2012-08-12: qty 22

## 2012-08-12 NOTE — Progress Notes (Signed)
FMTS Daily Intern Progress Note  Subjective: Pt this morning complains of stomach pain due to constipation causing stomach to push up on lungs and make it feel like he can't breathe.  He also states he did not get lasix last night or today.  MAR shows lasix 80mg  IV this morning 630AM administered.  I have reviewed the patient's medications.  Objective Temp:  [98 F (36.7 C)-98.8 F (37.1 C)] 98.4 F (36.9 C) (11/19 0654) Pulse Rate:  [83-110] 83  (11/19 0654) Resp:  [16-28] 16  (11/19 0654) BP: (120-167)/(68-118) 142/81 mmHg (11/19 0654) SpO2:  [92 %-99 %] 92 % (11/19 0654) Weight:  [307 lb 8 oz (139.481 kg)-320 lb (145.151 kg)] 307 lb 8 oz (139.481 kg) (11/18 2031)   Intake/Output Summary (Last 24 hours) at 08/12/12 0906 Last data filed at 08/12/12 0647  Gross per 24 hour  Intake    480 ml  Output    425 ml  Net     55 ml    General:  General: Obese male lying in bed in NAD asleep, but wakes appropriately  HEENT: AT/[place], sclera clear, EOMI  Cardiovascular: Distant heart sounds but RRR  Respiratory: Coarse expiratory breath sounds throughout with decreased breath movement bilateral bases and poor effort, mild dyspnea at rest  Abdomen: obese, mildly tender diffusely due to tightness, moderately distended, no organomegaly appreciated, but limited by habitus; Hernia repair midline under umbilicus Extremities: Atraumatic; bilateral LE 2+ pitting edema to knees; toenails starting to come off with some areas that appear scabbed/with dried blood Skin: No rashes or cyanosis  Neuro: Alert, oriented, no focal deficits   Labs and Imaging  Lab 08/12/12 0505 08/11/12 1541  WBC 8.8 10.2  HGB 8.2* 8.4*  HCT 25.6* 26.5*  PLT 240 203     Lab 08/12/12 0505 08/11/12 1541  NA 140 135  K 4.3 4.8  CL 110 104  CO2 22 20  BUN 32* 32*  CREATININE 4.40* 4.19*  LABGLOM -- --  GLUCOSE 168* --  CALCIUM 9.0 9.1    Assessment and Plan - BAYAN PILAT is a 57 y.o. year old male with  history of uncontrolled DM Type II, systolic CHF, HLD, OSA, obesity, HTN, and stage 4 CKD presenting with a CHF exacerbation.   1. CHF exacerbation - Patient with reported weight gain, edema, difficulty breathing and orthopnea, elevated BNP, and CXR findings supportive of CHF exacerbation, likely due to medication noncompliance over the past few weeks. EKG with sinus tachycardia and no ST elevations. Troponin poc neg and troponin i neg x 2. In discussion of why noncompliant, patient alludes to life not feeling fulfilling with medical problems. - F/u final cardiac enzyme  - Continue lasix 80mg  IV q12 hours (home regimen was Torsemide 150 mg BID) to endpoint of improved pulmonary function but not wanting to overdry in setting of CKD; rcd first dose 0630AM today; watch at this dose today - If no improvement, consider increasing to max of 160mg  IV, or adding mitolazone or milrinone. Limit would be rising Cr - Call patient's cardiologist Dr Claiborne Billings [facility]) to find out when last visit, let know pt here  - Strict I:O and daily weights: +70mL fluid balance this morning - Continuous pulse oximetry with O2 by [place] prn oxygen desaturations <92% -  On RA this morning - F/u PT/OT recs - TED hose to help with venous return - Depression screen and contacted PCP to possibly visit patient to encourage him, encourage med compliance and follow-up  at [place] heart failure support on discharge  2. CKD, stage IV - Cr worsened from baseline, likely due to diuretic and dose increase in recent past. Patient refuses dialysis. Creatinine today 4.4, up from 4.1 on admission (baseline appears ~3.0)  - Hold losartan and lisinopril, which patient is on at home; on discharge, pick one and limit dose by renal fcn  - CMET in AM to evaluate for hepatorenal syndrome as possible contributor to increased edema (Cr, albumin, LFTs)  3. HTN - Stable, BP elevated to 140s/80s this AM - Continue home coreg 25mg  po BID, imdur  24hr tablet 60mg  daily.  - Continue to hold losartan and lisinopril in setting of acute on CKD - On discharge, pick ACEi or ARB, and limit per renal fcn  4. H/o CAD  - Continue plavix 75mg  daily and aspirin 81mg  daily   5. Dyspnea - Likely related to obesity, OSA and CHF  - Continue PRN albuterol, which patient states he has not needed lately at home   6. DM Type II - Last A1c 10.7 in August. On levemir 75 unit BID at home - hold for now  - sensitive SSI and lantus 10 units daily  - F/u Hemoglobin A1c (checked as patient has poor outpatient follow-up)  - Toes appear in poor health -  consider topical medication/cleaning   7. Anemia of chronic disease - Hemoglobin 8.2 today from 8.4 yesterday, baseline appears to be around 9-10. Likely lower due to worsening CKD   FEN/GI -  F: Saline lock IV in patient with heart failure  E: All wnl other than worsening BUN and Cr; f/u BMET N: Renal and carb modified diet  GI: Constipation with last BM 2 days ago that was small and hard  - Miralax and colace prn  - Abdominal US to evaluate for ascites - Patient does not want enema  Prophylaxis - Lovenox subcutaneously daily   Code Status - Discussed with patient - Full code   Disposition - Pending improvement in fluid status and completing cardiac workup   Conni Slipper [phone] 08/12/2012, 9:06 AM

## 2012-08-12 NOTE — H&P (Signed)
Family Medicine Teaching Service Attending Note  I interviewed and examined patient Jonathon Bailey and reviewed their tests and x-rays.  I discussed with Dr. Francesco Sor and reviewed their note for today.  I agree with their assessment and plan.     Additionally  Alert no acute distress but still feels swollen Difficult situation with CHF and advance renal failure Aim to diurese enough in hospital to oxygenate well and then slowly as outpatient I believe depression/apathy is a large part of his noncompliance.  Will discuss with PCP and consider antidepressant

## 2012-08-12 NOTE — Progress Notes (Signed)
Utilization review completed.  

## 2012-08-12 NOTE — Progress Notes (Signed)
See H& P.

## 2012-08-12 NOTE — Progress Notes (Addendum)
Occupational Therapy Evaluation Patient Details Name: GEHRIG SCHIESS MRN: [id number] DOB: [birth year] Today's Date: 08/12/2012 Time: [id number] OT Time Calculation (min): 37 min  OT Assessment / Plan / Recommendation Clinical Impression  56 yo with CHF exacerbation. PTA, pt independent with all ADL and mobility. Pt at baseline level with ADL and mobility for ADL. Completed education regarding E conservation techniques and AE. Pt given information for "low vision" resources. No further OT indicated.    OT Assessment  Patient does not need any further OT services    Follow Up Recommendations  No OT follow up    Barriers to Discharge  none    Equipment Recommendations  None recommended by OT    Recommendations for Other Services  Nutritional consult -  Pt interested in "weight loss"  Frequency    eval only   Precautions / Restrictions Precautions Precautions: None   Pertinent Vitals/Pain No c/o pain. O2 sats RA 94. Dyspnea 2/4.    ADL  Eating/Feeding: Independent Grooming: Independent Upper Body Bathing: Modified independent Lower Body Bathing: Modified independent Upper Body Dressing: Independent Lower Body Dressing: Modified independent Where Assessed - Lower Body Dressing: Unsupported sit to stand Toilet Transfer: Modified independent Toilet Transfer Method: Stand pivot;Sit to Loss adjuster, chartered: Comfort height toilet Toileting - Clothing Manipulation and Hygiene: Independent Where Orange and Hygiene: Standing Tub/Shower Transfer: Modified independent Equipment Used: Gait belt Transfers/Ambulation Related to ADLs: independent ADL Comments: Educated pt on E conservation techniques and available AE for ADL. Pt verbalized understanding.    OT Diagnosis:    OT Problem List:   OT Treatment Interventions:     OT Goals Acute Rehab OT Goals OT Goal Formulation:  (eval only)  Visit Information  Last OT Received On:  08/12/12 Assistance Needed: +1    Subjective Data      Prior Functioning     Home Living Lives With: Significant other Available Help at Discharge: Family;Available 24 hours/day Type of Home: House Home Access: Ramped entrance Home Layout: Multi-level;Other (Comment) (has chair lift for significant other) Alternate Level Stairs-Number of Steps: 14 Bathroom Shower/Tub: Chiropodist: Standard Bathroom Accessibility: Yes How Accessible: Accessible via walker Home Adaptive Equipment: Walker - rolling;Shower chair with back;Wheelchair - Corporate investment banker Prior Function Level of Independence: Independent Able to Take Stairs?: Yes Driving: No Vocation: On disability Communication Communication: No difficulties Dominant Hand: Right         Vision/Perception Vision - Assessment Eye Alignment: Within Functional Limits   Cognition  Overall Cognitive Status: Appears within functional limits for tasks assessed/performed Arousal/Alertness: Awake/alert Orientation Level: Appears intact for tasks assessed Behavior During Session: [facility] for tasks performed    Extremity/Trunk Assessment Right Upper Extremity Assessment RUE ROM/Strength/Tone: Within functional levels RUE Sensation: WFL - Proprioception;Deficits (numbness/tingling) RUE Coordination: WFL - gross/fine motor Left Upper Extremity Assessment LUE ROM/Strength/Tone: WFL for tasks assessed LUE Sensation: WFL - Proprioception;Deficits LUE Sensation Deficits: numbness/tingling LUE Coordination: WFL - gross/fine motor Right Lower Extremity Assessment RLE ROM/Strength/Tone: WFL for tasks assessed RLE Sensation: Deficits (numbness/tingling) RLE Coordination: WFL - gross/fine motor Left Lower Extremity Assessment LLE ROM/Strength/Tone: WFL for tasks assessed LLE Sensation: WFL - Proprioception;Deficits LLE Sensation Deficits: numbness/tingling LLE Coordination: WFL - gross/fine motor Trunk  Assessment Trunk Assessment: Normal     Mobility Bed Mobility Bed Mobility: Supine to Sit Supine to Sit: 7: Independent Transfers Transfers: Sit to Stand;Stand to Sit Sit to Stand: 7: Independent Stand to Sit: 7: Independent     Shoulder  Instructions     Exercise     Balance Balance Balance Assessed: Yes Dynamic Standing Balance Dynamic Standing - Balance Support: No upper extremity supported Dynamic Standing - Level of Assistance: 6: Modified independent (Device/Increase time) Dynamic Standing - Balance Activities: Reaching for objects;Reaching across midline Dynamic Standing - Comments: Able to retrieve items from floor   End of Session OT - End of Session Equipment Utilized During Treatment: Gait belt Activity Tolerance: Patient tolerated treatment well Patient left: in bed;with call bell/phone within reach Nurse Communication: Mobility status  GO     Clayten Allcock,HILLARY 08/12/2012, 11:38 AM Maurie Boettcher, OTR/L  [phone] 08/12/2012

## 2012-08-12 NOTE — Evaluation (Signed)
Physical Therapy Evaluation Patient Details Name: Jonathon Bailey MRN: [id number] DOB: 1956/07/08 Today's Date: 08/12/2012 Time: [id number] PT Time Calculation (min): 18 min  PT Assessment / Plan / Recommendation Clinical Impression  Pt admitted with exacerbation of CHF and SOB presently at baseline functional level able to perform transfers and gait without assistance. Pt limited by pulmonary status with ambulation and educated for energy conservation with activity, gait and stairs with pt verbalizing understanding. Sats 95-97% on RA throughout all activity with HR up to 101 with gait. Pt agrees to no further needs. Signing off.     PT Assessment  Patent does not need any further PT services    Follow Up Recommendations  No PT follow up    Does the patient have the potential to tolerate intense rehabilitation      Barriers to Discharge        Equipment Recommendations  None recommended by PT    Recommendations for Other Services     Frequency      Precautions / Restrictions Precautions Precautions: None   Pertinent Vitals/Pain No pain      Mobility  Bed Mobility Bed Mobility: Supine to Sit Supine to Sit: HOB elevated;6: Modified independent (Device/Increase time) (HOB 35degrees) Transfers Sit to Stand: 6: Modified independent (Device/Increase time);From bed Stand to Sit: 6: Modified independent (Device/Increase time);To chair/3-in-1 Ambulation/Gait Ambulation/Gait Assistance: 7: Independent Ambulation Distance (Feet): 300 Feet Assistive device: None Gait Pattern: Within Functional Limits Gait velocity: decreased Stairs: Yes Stairs Assistance: 6: Modified independent (Device/Increase time) Stair Management Technique: One rail Left Number of Stairs: 11     Shoulder Instructions     Exercises     PT Diagnosis:    PT Problem List:   PT Treatment Interventions:     PT Goals    Visit Information  Last PT Received On: 08/12/12 Assistance Needed: +1      Subjective Data  Subjective: [name] are gonna make me cuss Patient Stated Goal: go home   Prior Functioning  Home Living Lives With: Significant other Available Help at Discharge: Family;Available 24 hours/day Type of Home: House Home Access: Ramped entrance Home Layout: Multi-level;Other (Comment) (has chair lift for significant other) Alternate Level Stairs-Number of Steps: 14 Bathroom Shower/Tub: Chiropodist: Standard Bathroom Accessibility: Yes How Accessible: Accessible via walker Home Adaptive Equipment: Walker - rolling;Shower chair with back;Wheelchair - Corporate investment banker Prior Function Level of Independence: Independent Able to Take Stairs?: Yes Driving: No Vocation: On disability Communication Communication: No difficulties Dominant Hand: Right    Cognition  Overall Cognitive Status: Appears within functional limits for tasks assessed/performed Arousal/Alertness: Awake/alert Orientation Level: Appears intact for tasks assessed Behavior During Session: [facility] for tasks performed    Extremity/Trunk Assessment Right Upper Extremity Assessment RUE ROM/Strength/Tone: Within functional levels RUE Sensation: WFL - Proprioception;Deficits (numbness/tingling) RUE Coordination: WFL - gross/fine motor Left Upper Extremity Assessment LUE ROM/Strength/Tone: WFL for tasks assessed LUE Sensation: WFL - Proprioception;Deficits LUE Sensation Deficits: numbness/tingling LUE Coordination: WFL - gross/fine motor Right Lower Extremity Assessment RLE ROM/Strength/Tone: WFL for tasks assessed RLE Sensation: Deficits (numbness/tingling) RLE Coordination: WFL - gross/fine motor Left Lower Extremity Assessment LLE ROM/Strength/Tone: WFL for tasks assessed LLE Sensation: WFL - Proprioception;Deficits LLE Sensation Deficits: numbness/tingling LLE Coordination: WFL - gross/fine motor Trunk Assessment Trunk Assessment: Normal   Balance Balance Balance Assessed:  Yes Dynamic Standing Balance Dynamic Standing - Balance Support: No upper extremity supported Dynamic Standing - Level of Assistance: 6: Modified independent (Device/Increase time) Dynamic Standing - Balance Activities:  Reaching for objects;Reaching across midline Dynamic Standing - Comments: Able to retrieve items from floor  End of Session PT - End of Session Activity Tolerance: Patient tolerated treatment well Patient left: in chair;with call bell/phone within reach  GP    Earma Reading, SPT Lanetta Inch Beth 08/12/2012, 2:12 PM  Elwyn Reach, [place]

## 2012-08-13 DIAGNOSIS — I509 Heart failure, unspecified: Secondary | ICD-10-CM | POA: Insufficient documentation

## 2012-08-13 LAB — GLUCOSE, CAPILLARY: Glucose-Capillary: 315 mg/dL — ABNORMAL HIGH (ref 70–99)

## 2012-08-13 LAB — COMPREHENSIVE METABOLIC PANEL
ALT: 10 U/L (ref 0–53)
AST: 9 U/L (ref 0–37)
Alkaline Phosphatase: 80 U/L (ref 39–117)
CO2: 24 mEq/L (ref 19–32)
Calcium: 9.3 mg/dL (ref 8.4–10.5)
Chloride: 105 mEq/L (ref 96–112)
GFR calc Af Amer: 14 mL/min — ABNORMAL LOW (ref >90)
GFR calc non Af Amer: 12 mL/min — ABNORMAL LOW (ref >90)
Glucose, Bld: 308 mg/dL — ABNORMAL HIGH (ref 70–99)
Sodium: 137 mEq/L (ref 135–145)
Total Bilirubin: 0.2 mg/dL — ABNORMAL LOW (ref 0.3–1.2)

## 2012-08-13 MED ORDER — FUROSEMIDE 80 MG PO TABS: 160.0000 mg | ORAL_TABLET | Freq: Every morning | ORAL | Status: DC

## 2012-08-13 MED ORDER — ASPIRIN 81 MG PO TBEC: 81.0000 mg | DELAYED_RELEASE_TABLET | Freq: Every day | ORAL | Status: DC

## 2012-08-13 MED ORDER — DSS 100 MG PO CAPS: 100.0000 mg | ORAL_CAPSULE | Freq: Two times a day (BID) | ORAL | Status: DC | PRN

## 2012-08-13 MED ORDER — POLYETHYLENE GLYCOL 3350 17 G PO PACK: 17.0000 g | PACK | Freq: Two times a day (BID) | ORAL | Status: DC

## 2012-08-13 NOTE — Progress Notes (Signed)
FMTS Daily Intern Progress Note  Subjective: Pt had bowel movement and feels slightly better this AM, though breathing and leg edema about the same.  Is concerned because feels he only got lasix once yesterday and has not gotten yet today.  I have reviewed the patient's medications.  Objective Temp:  [97.7 F (36.5 C)-98.7 F (37.1 C)] 97.7 F (36.5 C) (11/20 0655) Pulse Rate:  [82-101] 90  (11/20 0655) Resp:  [16-18] 18  (11/20 0655) BP: (150-156)/(81-94) 156/94 mmHg (11/20 0655) SpO2:  [92 %-97 %] 94 % (11/20 0655) Weight:  [309 lb 14.4 oz (140.57 kg)] 309 lb 14.4 oz (140.57 kg) (11/20 0655)   Intake/Output Summary (Last 24 hours) at 08/13/12 [id number] Last data filed at 08/13/12 0900  Gross per 24 hour  Intake    946 ml  Output   1650 ml  Net   -704 ml   General: Obese male, NAD, able to ambulate well, pleasant  HEENT: AT/[place], sclera clear, EOMI  Cardiovascular: Distant heart sounds but RRR  Respiratory: Coarse expiratory breath sounds throughout with decreased breath movement and mild crackles bilateral bases, and poor effort, mild dyspnea at rest, soft expiratory wheeze throughout Abdomen: obese, moderately distended, no organomegaly appreciated, but limited by habitus; Hernia repair midline under umbilicus, no tenderness, NABS Extremities: Atraumatic and no cyanosis; bilateral LE 2+ pitting edema to knees; wearing TED hose Skin: No rashes or cyanosis  Neuro: Alert, oriented, no focal deficits, normal gait  Labs and Imaging  Lab 08/12/12 0505 08/11/12 1541  WBC 8.8 10.2  HGB 8.2* 8.4*  HCT 25.6* 26.5*  PLT 240 203    Lab 08/12/12 0505 08/11/12 1541  NA 140 135  K 4.3 4.8  CL 110 104  CO2 22 20  BUN 32* 32*  CREATININE 4.40* 4.19*  LABGLOM -- --  GLUCOSE 168* --  CALCIUM 9.0 9.1   Abdominal ultrasound: IMPRESSION: Mildly enlarged liver.  Question medical renal disease.  Contracted gallbladder despite patient being n.p.o. for greater  than 6 hours.  This could  be related to surreptitious p.o. intake or potentially  chronic cholecystitis.   A1c 9.9  Assessment and Plan - Jonathon Bailey is a 56 y.o. year old male with history of uncontrolled DM Type II, systolic CHF, HLD, OSA, obesity, HTN, and stage 4 CKD presenting with a CHF exacerbation.   1. CHF exacerbation - Patient with reported weight gain, edema, difficulty breathing and orthopnea, elevated BNP, and CXR findings supportive of CHF exacerbation, likely due to medication noncompliance over the past few weeks. EKG with sinus tachycardia and no ST elevations. Troponins cycled and negative. In discussion of why noncompliant, patient alludes to life not feeling fulfilling with medical problems.   - Continue lasix 80mg  IV q12 hours (home regimen was Torsemide 150 mg BID) to endpoint of improved pulmonary function but not wanting to overdry in setting of CKD; rcd first dose 0630AM yesterday per [facility]; watch at this dose and f/u on administration - If no improvement, consider increasing to max of 160mg  IV, or adding mitolazone or milrinone. Limit would be rising Cr - Call patient's cardiologist Dr Claiborne Billings [facility]) to find out when last visit, let know pt here  - Depression screen and contacted PCP to possibly visit patient to encourage him, encourage med compliance and follow-up at [facility] - will see this AM - Consider Home Health heart failure support on discharge - Strict I:O and daily weights: neg 764 fluid balance this morning - moderate diuresis -  Continuous pulse oximetry with O2 by [place] prn oxygen desaturations <92% -  On RA this morning - Wearing TED hose - PT/OT saw and recommend no follow up  2. CKD, stage IV - Cr worsened from baseline, likely due to diuretic and dose increase in recent past. Patient refuses dialysis. Creatinine yest 4.4, up from 4.1 on admission (baseline appears ~3.0)  - Hold losartan and lisinopril, which patient is on at home; on discharge, pick one and limit dose by renal fcn  -  f/u today's CMET to evaluate for hepatorenal syndrome as possible contributor to increased edema (Cr, albumin, LFTs)  3. HTN - Stable, BP elevated to 150s/90s this AM - Continue home coreg 25mg  po BID, imdur 24hr tablet 60mg  daily.  - Continue to hold losartan and lisinopril in setting of acute on CKD - On discharge, pick ACEi or ARB, and limit per renal fcn  4. H/o CAD  - Continue plavix 75mg  daily and aspirin 81mg  daily   5. Dyspnea - Likely related to obesity, OSA and CHF  - Continue PRN albuterol, which patient states he has not needed lately at home   6. DM Type II - A1c 9.9 11/19; Last A1c 10.7 in August. On levemir 75 unit BID at home - hold for now  - sensitive SSI and lantus 10 units daily  - Bacitracin to toes which appeared to have superficial infection on admission  7. Anemia of chronic disease - Hemoglobin 8.2 11/19 from 8.4 11/18, baseline appears to be around 9-10. Likely lower due to worsening CKD   FEN/GI -  F: Saline locked IV in patient with heart failure  E: All wnl other than BUN and Cr; f/u today's labs N: Renal and carb modified diet  GI: Was complaining of constipation, improving with BM this morning - Miralax and colace prn  - Abdominal US showed no ascites though liver enlarged   Prophylaxis - Lovenox subcutaneously daily   Code Status - Discussed with patient - Full code - Document   Disposition - Pending improvement in fluid status and completing cardiac workup   Conni Slipper [phone] 08/13/2012, 9:22 AM

## 2012-08-13 NOTE — Progress Notes (Signed)
Inpatient Diabetes Program Recommendations  AACE/ADA: New Consensus Statement on Inpatient Glycemic Control (2013)  Target Ranges:  Prepandial:   less than 140 mg/dL      Peak postprandial:   less than 180 mg/dL (1-2 hours)      Critically ill patients:  140 - 180 mg/dL   Reason for Visit: Results for Jonathon Bailey, Jonathon Bailey (MRN [id number]) as of 08/13/2012 09:40  Ref. Range 08/12/2012 05:59 08/12/2012 11:19 08/12/2012 16:22 08/12/2012 21:35 08/13/2012 06:06  Glucose-Capillary Latest Range: 70-99 mg/dL 140 (H) 233 (H) 319 (H) 303 (H) 223 (H)   Note that A1C is improved from August, 2013.   Results for [name], [name] (MRN [id number]) as of 08/13/2012 09:40  Ref. Range 05/23/2012 14:21 08/12/2012 05:05  Hemoglobin A1C Latest Range: <5.7 % 10.7 9.9 (H)    According to Medication reconciliation, patient was on Levemir 75 units bid.  Recommend discontinuation of Lantus and resumption of 1/2 of home dose of Levemir.  Consider Levemir 37 units bid.  Will follow.

## 2012-08-13 NOTE — Progress Notes (Signed)
Family Medicine Teaching Service Attending Note  I interviewed and examined patient Spessard and reviewed their tests and x-rays.  I discussed with Dr. Curt Jews and reviewed their note for today.  I agree with their assessment and plan.     Additionally  Feeling slightly better No O2 requirment Continue moderate diuresis Aim to discharge in AM to continue home slow diuresis and better control of chronic medical problems His cooperation with our medical care and cessation of alcohol are key

## 2012-08-13 NOTE — Progress Notes (Signed)
PCP Note:  I saw and examined Mr. Bilski earlier today.  He states he was in the hospital because of his CHF. He does not have much else to offer as to explain what exactly caused his exacerbation. I have encouraged him to see me more routinely in the clinic so we can stay on top of his chronic medical problems including HTN, CHF and DM. He states he wants to "live his life" which I understand but I told him he could better live his life if he was healthy.  I truly appreciate the excellent care of the inpatient service for Mr. Buracker. I am available if I can assist in any way and I will be happy to see Mr. Konopa in the clinic once he is discharged from the hospital.  Tyler Pita. Caili Escalera, M.D. 08/13/2012 2:14 PM

## 2012-08-13 NOTE — Discharge Summary (Signed)
Discharge Summary 08/14/2012 4:33 PM  Jonathon Bailey DOB: [birth year] MRN: [id number]  Date of Admission: 08/11/2012 Date of Discharge: 08/13/2012  PCP: Melrose Nakayama, MD Consultants: None  Reason for Admission: CHF exacerbation  Discharge Diagnosis Primary 1. Acute systolic CHF exacerbation Secondary 1. CKD stage IV 2. HTN 3. H/o CAD 4. Dyspnea 5. DM Type II 6. Anemia of Chronic disease  Hospital Course:  Jonathon Bailey is a 56 y.o. year old male with history of uncontrolled DM Type II, systolic CHF, HLD, OSA, obesity, HTN, and stage 4 CKD presenting with a CHF exacerbation.   1. CHF exacerbation - Patient with reported weight gain, edema, difficulty breathing and orthopnea, elevated BNP, and CXR findings supportive of CHF exacerbation, likely due to medication noncompliance over the past few weeks. EKG with sinus tachycardia and no ST elevations. Troponins cycled and negative. In discussion of why noncompliant, patient alludes to life not feeling fulfilling with medical problems.  Gave patient O2 by [place] which he was initially needing.  Dosed lasix 80mg  IV q12 hours (home regimen was Torsemide 150 mg BID) to endpoint of improved pulmonary function but not wanting to overdry in setting of CKD.  Limit to further diuresis was patient's rising creatinine.  Patient had moderate diuresis and only reported receiving the IV lasix once daily.  Discharged on lasix 160mg  qAM after discussing preferred medication and dose with patient.  Recommend follow-up with cardiologist Dr Claiborne Billings [facility]) and set patient up with close PCP follow up.  By discharge, patient was satting well on room air.  PT/OT saw and recommended no follow up.  Suspect some depression component to noncompliance as patient alludes to life not being as fulfilling with multiple illnesses.  May benefit from psychology follow-up or [place] Failure team, if available.  2. CKD, stage IV - Baseline creatinine around 3,  likely medicorenal with poorly controlled DM Type II.  Creatinine 4.1 on admission, likely due to diuretic with recent dose increase.  Patient is refusing dialysis.  Held losartan and lisinopril and discontinued on discharge.  CMET showed normal LFTs but with low albumin, and abdominal ultrasound showed no ascites, making hepatorenal syndrome unlikely.  Creatinine on discharge was 4.89.  Renal failure and heart failure require delicate balance, and had to allow some renal injury in order to diurese enough for patient to have sound respiratory status. Follow closely with PCP.  Patient informed to avoid NSAIDs, alcohol, ACEi/ARB, and high salt diet.  3. HTN - Stable, BP elevated to 150s/90s morning of discharge.  Continued home coreg 25mg  PO BID, imdur 24 hr tablet 60mg  daily, and held losartan and lisinopril in setting of acute on CKD.  Likely will need to decide on better control without ACEi/ARB with PCP.  4. H/o CAD - Continued plavix 75mg  daily and aspirin 81mg  daily   5. Dyspnea - Likely related to obesity, OSA and CHF. Continued PRN albuterol, which patient states he has not needed lately at home.   6. DM Type II - Last A1c 10.7 in August, so rechecked and 9.9.  Patient on levemir 75 unit BID at home, which we held and gave sensitive SSI and lantus 10units daily.  Toes appeared to have superficial infection so dosed bacitracin BID during hospitalization to help prevent diabetic foot infection.  Follow as outpatient.  Discharged on home regimen.  7. Anemia of chronic disease - Hemoglobin 8.2 11/19 from 8.4 11/18, baseline appears to be around 9-10. Likely lower due to worsening CKD  8. Constipation - Dosed miralax and colace, which helped with constipation, so discharged on this regimen prn.     Discharge day physical exam: Filed Vitals:   08/13/12 1726  BP: 169/94  Pulse: 88  Temp:   Resp:   General: Obese male, NAD, able to ambulate well, pleasant  HEENT: AT/[place], sclera clear, EOMI    Cardiovascular: Distant heart sounds but RRR  Respiratory: Coarse expiratory breath sounds throughout with decreased breath movement and mild crackles bilateral bases, and poor effort, mild dyspnea at rest, soft expiratory wheeze throughout  Abdomen: obese, moderately distended, no organomegaly appreciated, but limited by habitus; Hernia repair midline under umbilicus, no tenderness, NABS  Extremities: Atraumatic and no cyanosis; bilateral LE 2+ pitting edema to knees; wearing TED hose  Skin: No rashes or cyanosis  Neuro: Alert, oriented, no focal deficits, normal gait   Procedures: None  Discharge Medications   Medication List     As of 08/14/2012  4:33 PM    STOP taking these medications         lisinopril 20 MG tablet   Commonly known as: PRINIVIL,ZESTRIL      losartan 100 MG tablet   Commonly known as: COZAAR      torsemide 100 MG tablet   Commonly known as: DEMADEX      TAKE these medications         albuterol 108 (90 BASE) MCG/ACT inhaler   Commonly known as: PROVENTIL HFA;VENTOLIN HFA   Inhale 2 puffs into the lungs every 4 (four) hours as needed. For shortness of breath and wheezing      aspirin 81 MG EC tablet   Take 1 tablet (81 mg total) by mouth daily.      benzonatate 100 MG capsule   Commonly known as: TESSALON   Take 100 mg by mouth 2 (two) times daily as needed. For cough      calcitRIOL 0.25 MCG capsule   Commonly known as: ROCALTROL   Take 0.25 mcg by mouth daily.      carvedilol 25 MG tablet   Commonly known as: COREG   Take 25 mg by mouth 2 (two) times daily with meals.      cetirizine 10 MG tablet   Commonly known as: ZYRTEC   Take 10 mg by mouth daily as needed. For nasal congestion      clopidogrel 75 MG tablet   Commonly known as: PLAVIX   Take 75 mg by mouth daily.      DSS 100 MG Caps   Take 100 mg by mouth 2 (two) times daily as needed for constipation.      furosemide 80 MG tablet   Commonly known as: LASIX   Take 2 tablets  (160 mg total) by mouth every morning.      insulin detemir 100 UNIT/ML injection   Commonly known as: LEVEMIR   Inject 75 Units into the skin 2 (two) times daily. Please give pen if covered. 3 month supply.      Insulin Syringe-Needle U-100 25G X 1" 1 ML Misc   1 applicator by Does not apply route 2 (two) times daily. Please give needles 4 Levemir.      isosorbide mononitrate 60 MG 24 hr tablet   Commonly known as: IMDUR   Take 1 tablet (60 mg total) by mouth daily.      polyethylene glycol packet   Commonly known as: MIRALAX / GLYCOLAX   Take 17 g by mouth 2 (two) times  daily.      rosuvastatin 40 MG tablet   Commonly known as: CRESTOR   Take 40 mg by mouth at bedtime.        ProBNP 4441 Pertinent Hospital Labs Lab  08/12/12 0505  08/11/12 1541   WBC  8.8  10.2   HGB  8.2*  8.4*   HCT  25.6*  26.5*   PLT  240  203     Lab  08/12/12 0505  08/11/12 1541   NA  140  135   K  4.3  4.8   CL  110  104   CO2  22  20   BUN  32*  32*   CREATININE  4.40*  4.19*   LABGLOM  --  --   GLUCOSE  168*  --   CALCIUM  9.0  9.1    2view CXR on admission: IMPRESSION:  Cardiomegaly with slight worsening interstitial pattern and small  effusions, suspect early CHF.  Abdominal ultrasound: IMPRESSION: Mildly enlarged liver.  Question medical renal disease.  Contracted gallbladder despite patient being n.p.o. for greater  than 6 hours.  This could be related to surreptitious p.o. intake or potentially  chronic cholecystitis.    Discharge instructions: See after visit summary  Condition at discharge: stable  Disposition: To home  Pending Tests: None  Follow up: Follow-up Information    Follow up with Melrose Nakayama, MD. On 08/15/2012. (9:15 AM for hospital follow-up. Please arrive 10 minutes early.)    Contact information:   [address] [place] [address] [phone]          Follow up Issues:  - Monitor for adequate diuresis, balancing with AoCKD - Check  Cr with diuresis - Check BP and consider adding antihypertensive in pt (had to stop ACEi/ARB with AoCKD) - Consider further depression workup - Consider home health heart failure team if this is available to patient to help with compliance - Evaluate foot health in diabetic  Conni Slipper 08/14/2012 4:33 PM PGY-1 Family Medicine Teaching Service Service pager [phone]

## 2012-08-13 NOTE — Progress Notes (Signed)
DC IV, DC Tele, DC Home. Discharge instructions and home medications discussed with patient and patient's family members. Patient and family denied any questions or concerns at this time. Patient leaving unit ambulatory and appears in no acute distress.

## 2012-08-15 ENCOUNTER — Ambulatory Visit (INDEPENDENT_AMBULATORY_CARE_PROVIDER_SITE_OTHER): Payer: Medicare Other | Admitting: Family Medicine

## 2012-08-15 ENCOUNTER — Encounter: Payer: Self-pay | Admitting: Family Medicine

## 2012-08-15 VITALS — BP 101/55 | HR 94 | Temp 98.1°F | Ht 72.0 in | Wt 308.0 lb

## 2012-08-15 DIAGNOSIS — E1165 Type 2 diabetes mellitus with hyperglycemia: Secondary | ICD-10-CM

## 2012-08-15 DIAGNOSIS — I509 Heart failure, unspecified: Secondary | ICD-10-CM

## 2012-08-15 MED ORDER — CARVEDILOL 25 MG PO TABS: 25.0000 mg | ORAL_TABLET | Freq: Two times a day (BID) | ORAL | Status: DC

## 2012-08-15 MED ORDER — MEDICAL COMPRESSION STOCKINGS MISC: 2.0000 | Freq: Every day | Status: DC

## 2012-08-15 MED ORDER — ISOSORBIDE MONONITRATE ER 60 MG PO TB24: 60.0000 mg | ORAL_TABLET | Freq: Every day | ORAL | Status: DC

## 2012-08-15 NOTE — Assessment & Plan Note (Signed)
Hospital f/u. Patient states he is taking all medication as prescribed and weighing himself. Edema improved. Will continue current regimen for now. Encouraged to keep track of his BP and weights at home and RTC on Dec 2 for follow up. At that time,. We will do labs to check his renal function with the diuresis. Will also closely montior BP as he was borderline low today.

## 2012-08-15 NOTE — Progress Notes (Signed)
Patient ID: Jonathon Bailey, male   DOB: 1956-08-17, 56 y.o.   MRN: [id number] [facility] Felix Pratt M. Ethlyn Alto, MD Phone: [phone]   Subjective: HPI: Patient is a 56 y.o. male presenting to clinic today for hospital follow up.  Patient states he is taking all medications as prescribed. Inpatient team was concerned about diuresis with ARF, but he reports good urine output. Weighing himself everyday, was 306lb this morning. Overall patient states he feels about the same as when he went into the hospital but maybe slightly better. He still has to sleep at an angle. Continues to cough. States he knows he still has the excess fluid, but does feel a little better at some times in the day. BP was 101/55 today in clinic. Also states his blood sugar at home has been high, but taking his Levemir as prescribed. He states his TED hose are wonderful and requesting another pair. No HA, congestion, CP, abd pain. Some SOB, cough and mild leg swelling but improved with ted hose.  Health Maintenance: Declines flu shot  ROS: Please see HPI above.  Objective: Office vital signs reviewed.  Physical Examination:  General: Awake, alert. Does not appear as perky as usual HEENT: Atraumatic, normocephalic Neck: No masses palpated. No LAD Pulm: CTAB, no wheezes. Good air movement. No crackles or rales Cardio: RRR, no murmurs appreciated Abdomen: Obese. +BS, soft, nontender, nondistended Extremities: Ted hose in place to knees, 1+ edema Neuro: Grossly intact, flat affect.  Assessment: 56 yo M hospital follow up  Plan: See Problem List and After Visit Summary

## 2012-08-15 NOTE — Patient Instructions (Signed)
It was good to see you today!  I am glad you are feeling somewhat better today. I have sent in your prescriptions to the pharmacy. Please make an appointment to see me on Dec 2.  Let me know if you need anything before then.  Take care! Maylen Waltermire M. Christyann Manolis, M.D.

## 2012-08-15 NOTE — Assessment & Plan Note (Signed)
Patient's A1C still >9. States taking all medications as prescribed. Continue Levemir. Once he feels better from his CHF exacerbation, will discuss further options for better DM control. For now, continue regimen.

## 2012-08-15 NOTE — Discharge Summary (Signed)
I have reviewed this discharge summary and agree.    

## 2012-08-18 ENCOUNTER — Inpatient Hospital Stay: Payer: Medicare Other | Admitting: Family Medicine

## 2012-08-25 ENCOUNTER — Ambulatory Visit (INDEPENDENT_AMBULATORY_CARE_PROVIDER_SITE_OTHER): Payer: Medicare Other | Admitting: Family Medicine

## 2012-08-25 ENCOUNTER — Encounter: Payer: Self-pay | Admitting: Family Medicine

## 2012-08-25 VITALS — BP 182/103 | HR 99 | Temp 98.7°F | Ht 72.0 in | Wt 304.0 lb

## 2012-08-25 DIAGNOSIS — I10 Essential (primary) hypertension: Secondary | ICD-10-CM

## 2012-08-25 DIAGNOSIS — I509 Heart failure, unspecified: Secondary | ICD-10-CM

## 2012-08-25 LAB — BASIC METABOLIC PANEL
BUN: 38 mg/dL — ABNORMAL HIGH (ref 6–23)
Calcium: 9.2 mg/dL (ref 8.4–10.5)
Creat: 5 mg/dL — ABNORMAL HIGH (ref 0.50–1.35)

## 2012-08-25 MED ORDER — CLOPIDOGREL BISULFATE 75 MG PO TABS: 75.0000 mg | ORAL_TABLET | Freq: Every day | ORAL | Status: DC

## 2012-08-25 NOTE — Assessment & Plan Note (Signed)
BP elevated to 182/103 today in triage. Eating excessive salt at this time. ACE/ARB were d/c at discharge due to worsening renal function. Will recheck Bmet today and consider restarting ARB. Patient agrees with this plan.

## 2012-08-25 NOTE — Assessment & Plan Note (Signed)
Patient slowly improving. Will check BMet today to monitor renal function. If creatinine is improving, will consider increasing diuresis for a short period. Will also check iron and ferritin levels today. Continue current regimen and RTC in 2 weeks. Encouraged patient to eat low sodium diet and wear his compression stockings.

## 2012-08-25 NOTE — Progress Notes (Signed)
Patient ID: Jonathon Bailey, male   DOB: 06/26/1956, 56 y.o.   MRN: [id number] [facility] Amber M. Hairford, MD Phone: [phone]   Subjective: HPI: Patient is a 56 y.o. male presenting to clinic today for follow up appointment.  Admitted to the hospital 2 weeks ago for acute exacerbation of CHF. He is continuing to take his diuretic (Lasix 160mg  daily) and states he is not urinating as much. States he feels like it is "not working."  (Previously on [employer] and reported it was not working either.) Despite this, he states he is feeling much better than he when he went to the hospital. Sleeping flat in bed with 3 pillows. Denies any shortness of breath or cough. No chest pain. States he still has swelling in his legs, continues to wear his compression hose most days. His BP is elevated today. States it is because he ate sausage this morning but continued to take all prescribed medications.  Of note, patient was out of town for Thanksgiving. During this time he was eating anything he wanted and was not weighing himself. Today in our clinic his weight was 304 (was 308 at last visit.)    History Reviewed: Non-smoker. Health Maintenance: Declines flu shot  ROS: Please see HPI above.  Objective: Office vital signs reviewed.  Physical Examination:  General: Awake, alert. NAD. Appears well. HEENT: Atraumatic, normocephalic Pulm: CTAB, no crackles. Mild expiratory wheeze. Cardio: Slightly tachycardic, no murmurs appreciated Abdomen: Obese, nontender, nondistended. No appreciable edema Extremities: 2+ edema to mid-tibia. Full ROM Neuro: Grossly intact  Assessment: 56 yo M presenting for follow up appointment   Plan: See Problem List and After Visit Summary

## 2012-08-25 NOTE — Patient Instructions (Signed)
It was good to see you today.  I am glad you are feeling better. Please watch what you eat.   I will call you with your lab results and we will decide what to do about your blood pressure medicine and your fluid pill.  Please come back to see me in 2 weeks.   Take care! Antanasia Kaczynski M. Jurline Folger, M.D.

## 2012-08-26 LAB — IRON: Iron: 65 ug/dL (ref 42–165)

## 2012-08-26 LAB — FERRITIN: Ferritin: 295 ng/mL (ref 22–322)

## 2012-08-27 ENCOUNTER — Telehealth: Payer: Self-pay | Admitting: Family Medicine

## 2012-08-27 NOTE — Telephone Encounter (Signed)
Reviewed labs with patient to inform him that his renal function is worsening. Given this I would like for him to only take ONE Lasix per day rather than two. Also, since we are decreasing his diuretics

## 2012-08-27 NOTE — Telephone Encounter (Signed)
Disregard previous note. After discussing with patient, we have decided to keep Lasix at TWO pills per day. We will recheck his labs at next visit. Patient agrees with this plan.  Also, he states he was unable to get new set of compression hose because it is $75/pair. I have asked him to keep using his old ted hose for now. He agrees.  Will return to see me in 1-2 weeks. At that time, we will discuss if he should see cardiology or nephrology.  Amber M. Hairford, M.D.

## 2012-09-15 ENCOUNTER — Other Ambulatory Visit: Payer: Self-pay | Admitting: Family Medicine

## 2012-09-15 ENCOUNTER — Ambulatory Visit: Payer: Medicare Other | Admitting: Family Medicine

## 2012-10-03 ENCOUNTER — Ambulatory Visit: Payer: Medicare Other | Admitting: Family Medicine

## 2012-10-08 ENCOUNTER — Other Ambulatory Visit (HOSPITAL_COMMUNITY): Payer: Self-pay | Admitting: Cardiovascular Disease

## 2012-10-08 DIAGNOSIS — I2510 Atherosclerotic heart disease of native coronary artery without angina pectoris: Secondary | ICD-10-CM

## 2012-10-09 ENCOUNTER — Ambulatory Visit: Payer: Medicare Other | Admitting: Family Medicine

## 2012-10-11 ENCOUNTER — Inpatient Hospital Stay (HOSPITAL_COMMUNITY)
Admission: EM | Admit: 2012-10-11 | Discharge: 2012-10-18 | DRG: 264 | Disposition: A | Discharge status: Home / Self Care | Payer: Medicare Other | Attending: Family Medicine | Admitting: Family Medicine

## 2012-10-11 ENCOUNTER — Emergency Department (HOSPITAL_COMMUNITY): Payer: Medicare Other

## 2012-10-11 ENCOUNTER — Encounter (HOSPITAL_COMMUNITY): Payer: Self-pay | Admitting: Emergency Medicine

## 2012-10-11 DIAGNOSIS — F3289 Other specified depressive episodes: Secondary | ICD-10-CM | POA: Diagnosis present

## 2012-10-11 DIAGNOSIS — Z9861 Coronary angioplasty status: Secondary | ICD-10-CM

## 2012-10-11 DIAGNOSIS — Z794 Long term (current) use of insulin: Secondary | ICD-10-CM

## 2012-10-11 DIAGNOSIS — M129 Arthropathy, unspecified: Secondary | ICD-10-CM | POA: Diagnosis present

## 2012-10-11 DIAGNOSIS — Z7902 Long term (current) use of antithrombotics/antiplatelets: Secondary | ICD-10-CM

## 2012-10-11 DIAGNOSIS — K5900 Constipation, unspecified: Secondary | ICD-10-CM | POA: Diagnosis not present

## 2012-10-11 DIAGNOSIS — Z9119 Patient's noncompliance with other medical treatment and regimen: Secondary | ICD-10-CM

## 2012-10-11 DIAGNOSIS — F329 Major depressive disorder, single episode, unspecified: Secondary | ICD-10-CM | POA: Diagnosis present

## 2012-10-11 DIAGNOSIS — Z992 Dependence on renal dialysis: Secondary | ICD-10-CM | POA: Diagnosis present

## 2012-10-11 DIAGNOSIS — F411 Generalized anxiety disorder: Secondary | ICD-10-CM | POA: Diagnosis present

## 2012-10-11 DIAGNOSIS — I5043 Acute on chronic combined systolic (congestive) and diastolic (congestive) heart failure: Principal | ICD-10-CM | POA: Diagnosis present

## 2012-10-11 DIAGNOSIS — E118 Type 2 diabetes mellitus with unspecified complications: Secondary | ICD-10-CM | POA: Diagnosis present

## 2012-10-11 DIAGNOSIS — I5042 Chronic combined systolic (congestive) and diastolic (congestive) heart failure: Secondary | ICD-10-CM | POA: Diagnosis present

## 2012-10-11 DIAGNOSIS — I2510 Atherosclerotic heart disease of native coronary artery without angina pectoris: Secondary | ICD-10-CM | POA: Diagnosis present

## 2012-10-11 DIAGNOSIS — I10 Essential (primary) hypertension: Secondary | ICD-10-CM | POA: Diagnosis present

## 2012-10-11 DIAGNOSIS — Z79899 Other long term (current) drug therapy: Secondary | ICD-10-CM

## 2012-10-11 DIAGNOSIS — I509 Heart failure, unspecified: Secondary | ICD-10-CM | POA: Diagnosis present

## 2012-10-11 DIAGNOSIS — N185 Chronic kidney disease, stage 5: Secondary | ICD-10-CM | POA: Diagnosis present

## 2012-10-11 DIAGNOSIS — J441 Chronic obstructive pulmonary disease with (acute) exacerbation: Secondary | ICD-10-CM | POA: Diagnosis present

## 2012-10-11 DIAGNOSIS — I252 Old myocardial infarction: Secondary | ICD-10-CM

## 2012-10-11 DIAGNOSIS — Z91199 Patient's noncompliance with other medical treatment and regimen due to unspecified reason: Secondary | ICD-10-CM

## 2012-10-11 DIAGNOSIS — G4733 Obstructive sleep apnea (adult) (pediatric): Secondary | ICD-10-CM | POA: Diagnosis present

## 2012-10-11 DIAGNOSIS — E785 Hyperlipidemia, unspecified: Secondary | ICD-10-CM | POA: Diagnosis present

## 2012-10-11 DIAGNOSIS — I120 Hypertensive chronic kidney disease with stage 5 chronic kidney disease or end stage renal disease: Secondary | ICD-10-CM | POA: Diagnosis present

## 2012-10-11 DIAGNOSIS — E1129 Type 2 diabetes mellitus with other diabetic kidney complication: Secondary | ICD-10-CM | POA: Diagnosis present

## 2012-10-11 DIAGNOSIS — I82409 Acute embolism and thrombosis of unspecified deep veins of unspecified lower extremity: Secondary | ICD-10-CM | POA: Diagnosis present

## 2012-10-11 DIAGNOSIS — Z8673 Personal history of transient ischemic attack (TIA), and cerebral infarction without residual deficits: Secondary | ICD-10-CM

## 2012-10-11 HISTORY — DX: Chronic systolic (congestive) heart failure: I50.22

## 2012-10-11 HISTORY — DX: Chronic kidney disease, stage 4 (severe): N18.4

## 2012-10-11 LAB — CBC WITH DIFFERENTIAL/PLATELET
Basophils Absolute: 0 10*3/uL (ref 0.0–0.1)
Basophils Relative: 0 % (ref 0–1)
Lymphocytes Relative: 13 % (ref 12–46)
MCHC: 31.7 g/dL (ref 30.0–36.0)
Neutro Abs: 6.5 10*3/uL (ref 1.7–7.7)
Platelets: 220 10*3/uL (ref 150–400)
RDW: 14.1 % (ref 11.5–15.5)
WBC: 8.6 10*3/uL (ref 4.0–10.5)

## 2012-10-11 LAB — BASIC METABOLIC PANEL
CO2: 20 mEq/L (ref 19–32)
Calcium: 8.7 mg/dL (ref 8.4–10.5)
Chloride: 102 mEq/L (ref 96–112)
Creatinine, Ser: 5.71 mg/dL — ABNORMAL HIGH (ref 0.50–1.35)
GFR calc Af Amer: 12 mL/min — ABNORMAL LOW (ref >90)
Sodium: 136 mEq/L (ref 135–145)

## 2012-10-11 LAB — PRO B NATRIURETIC PEPTIDE: Pro B Natriuretic peptide (BNP): 7437 pg/mL — ABNORMAL HIGH (ref 0–125)

## 2012-10-11 LAB — TROPONIN I: Troponin I: <0.3 ng/mL (ref <0.30)

## 2012-10-11 MED ORDER — FUROSEMIDE 10 MG/ML IJ SOLN
120.0000 mg | Freq: Once | INTRAVENOUS | Status: AC
  Administered 2012-10-11: 120 mg via INTRAVENOUS
  Filled 2012-10-11: qty 12

## 2012-10-11 MED ORDER — MEDICAL COMPRESSION STOCKINGS MISC: 2.0000 | Freq: Every day | Status: DC

## 2012-10-11 MED ORDER — ATORVASTATIN CALCIUM 80 MG PO TABS
80.0000 mg | ORAL_TABLET | Freq: Every day | ORAL | Status: DC
  Administered 2012-10-12 – 2012-10-17 (×6): 80 mg via ORAL
  Filled 2012-10-11 (×8): qty 1

## 2012-10-11 MED ORDER — INSULIN DETEMIR 100 UNIT/ML ~~LOC~~ SOLN
75.0000 [IU] | Freq: Two times a day (BID) | SUBCUTANEOUS | Status: DC
  Administered 2012-10-11 – 2012-10-12 (×3): 75 [IU] via SUBCUTANEOUS
  Filled 2012-10-11: qty 10

## 2012-10-11 MED ORDER — ALBUTEROL SULFATE HFA 108 (90 BASE) MCG/ACT IN AERS
2.0000 | INHALATION_SPRAY | RESPIRATORY_TRACT | Status: DC | PRN
  Filled 2012-10-11: qty 6.7

## 2012-10-11 MED ORDER — SODIUM CHLORIDE 0.9 % IV SOLN
250.0000 mL | INTRAVENOUS | Status: DC | PRN
  Administered 2012-10-17: 10:00:00 via INTRAVENOUS

## 2012-10-11 MED ORDER — FUROSEMIDE 10 MG/ML IJ SOLN
160.0000 mg | Freq: Four times a day (QID) | INTRAVENOUS | Status: DC
  Administered 2012-10-11 – 2012-10-13 (×7): 160 mg via INTRAVENOUS
  Filled 2012-10-11 (×9): qty 16

## 2012-10-11 MED ORDER — ALBUTEROL SULFATE (5 MG/ML) 0.5% IN NEBU
2.5000 mg | INHALATION_SOLUTION | RESPIRATORY_TRACT | Status: DC | PRN
Start: 2012-10-11 — End: 2012-10-16
  Administered 2012-10-11 – 2012-10-13 (×2): 2.5 mg via RESPIRATORY_TRACT
  Filled 2012-10-11 (×2): qty 0.5

## 2012-10-11 MED ORDER — ONDANSETRON HCL 4 MG/2ML IJ SOLN: 4.0000 mg | Freq: Four times a day (QID) | INTRAMUSCULAR | Status: DC | PRN

## 2012-10-11 MED ORDER — SODIUM CHLORIDE 0.9 % IJ SOLN
3.0000 mL | Freq: Two times a day (BID) | INTRAMUSCULAR | Status: DC
Start: 2012-10-11 — End: 2012-10-18
  Administered 2012-10-11 – 2012-10-18 (×13): 3 mL via INTRAVENOUS

## 2012-10-11 MED ORDER — HEPARIN SODIUM (PORCINE) 5000 UNIT/ML IJ SOLN
5000.0000 [IU] | Freq: Three times a day (TID) | INTRAMUSCULAR | Status: DC
  Administered 2012-10-11 – 2012-10-12 (×2): 5000 [IU] via SUBCUTANEOUS
  Filled 2012-10-11 (×5): qty 1

## 2012-10-11 MED ORDER — FUROSEMIDE 10 MG/ML IJ SOLN: 80.0000 mg | Freq: Two times a day (BID) | INTRAMUSCULAR | Status: DC

## 2012-10-11 MED ORDER — CALCITRIOL 0.25 MCG PO CAPS
0.2500 ug | ORAL_CAPSULE | Freq: Every day | ORAL | Status: DC
  Administered 2012-10-12 – 2012-10-18 (×7): 0.25 ug via ORAL
  Filled 2012-10-11 (×7): qty 1

## 2012-10-11 MED ORDER — POTASSIUM CHLORIDE CRYS ER 20 MEQ PO TBCR
20.0000 meq | EXTENDED_RELEASE_TABLET | Freq: Two times a day (BID) | ORAL | Status: DC
  Administered 2012-10-11 – 2012-10-15 (×9): 20 meq via ORAL
  Filled 2012-10-11 (×13): qty 1

## 2012-10-11 MED ORDER — FUROSEMIDE 10 MG/ML IJ SOLN: 80.0000 mg | Freq: Once | INTRAMUSCULAR | Status: DC

## 2012-10-11 MED ORDER — ACETAMINOPHEN 325 MG PO TABS
650.0000 mg | ORAL_TABLET | ORAL | Status: DC | PRN
  Administered 2012-10-12 – 2012-10-18 (×9): 650 mg via ORAL
  Filled 2012-10-11 (×9): qty 2

## 2012-10-11 MED ORDER — DOCUSATE SODIUM 100 MG PO CAPS
100.0000 mg | ORAL_CAPSULE | Freq: Two times a day (BID) | ORAL | Status: DC | PRN
  Filled 2012-10-11: qty 1

## 2012-10-11 MED ORDER — CARVEDILOL 25 MG PO TABS
37.5000 mg | ORAL_TABLET | Freq: Two times a day (BID) | ORAL | Status: DC
  Administered 2012-10-12: 37.5 mg via ORAL
  Filled 2012-10-11 (×3): qty 1

## 2012-10-11 MED ORDER — SODIUM CHLORIDE 0.9 % IJ SOLN: 3.0000 mL | INTRAMUSCULAR | Status: DC | PRN

## 2012-10-11 MED ORDER — CLOPIDOGREL BISULFATE 75 MG PO TABS
75.0000 mg | ORAL_TABLET | Freq: Every day | ORAL | Status: DC
  Administered 2012-10-11 – 2012-10-15 (×5): 75 mg via ORAL
  Filled 2012-10-11 (×7): qty 1

## 2012-10-11 MED ORDER — FUROSEMIDE 10 MG/ML IJ SOLN
160.0000 mg | Freq: Two times a day (BID) | INTRAVENOUS | Status: DC
  Filled 2012-10-11 (×2): qty 16

## 2012-10-11 MED ORDER — INSULIN ASPART 100 UNIT/ML ~~LOC~~ SOLN
0.0000 [IU] | Freq: Three times a day (TID) | SUBCUTANEOUS | Status: DC
  Administered 2012-10-12: 2 [IU] via SUBCUTANEOUS
  Administered 2012-10-12 (×2): 3 [IU] via SUBCUTANEOUS
  Administered 2012-10-13 – 2012-10-16 (×5): 2 [IU] via SUBCUTANEOUS
  Administered 2012-10-16: 3 [IU] via SUBCUTANEOUS
  Administered 2012-10-16: 19:00:00 via SUBCUTANEOUS
  Administered 2012-10-17: 5 [IU] via SUBCUTANEOUS
  Administered 2012-10-17: 3 [IU] via SUBCUTANEOUS

## 2012-10-11 MED ORDER — ISOSORBIDE MONONITRATE ER 60 MG PO TB24
60.0000 mg | ORAL_TABLET | Freq: Every day | ORAL | Status: DC
  Administered 2012-10-11: 60 mg via ORAL
  Filled 2012-10-11 (×2): qty 1

## 2012-10-11 NOTE — ED Notes (Signed)
Pt c/o labored breathing x 3 weeks getting worse with increased swelling in legs; pt with hx of CHF; pt labored at present

## 2012-10-11 NOTE — ED Notes (Signed)
Patient C/O increasing shortness of breath beginning jist after new years.  C/o swelling in his feet that is progressively worsening.  States that he has been unable to get to [facility] to be seen.  Inspiratory and expiratory wheezes noted in both lung fields. Placed on oxygen at 2 LPM per cannula, SaO2 98%. 2+ edema of feet noted.

## 2012-10-11 NOTE — ED Provider Notes (Signed)
History     CSN: [id number]  Arrival date & time 10/11/12  1221   First MD Initiated Contact with Patient 10/11/12 1229      Chief Complaint  Patient presents with  . Shortness of Breath     The history is provided by the patient and medical records.   patient has a history of congestive heart failure.  Patient with worsening shortness of breath over the past several days with associated dyspnea on exertion and orthopnea.  Increasing lower extremity edema.  He has a history of heart failure diabetes hypertension and hyperlipidemia.  He has had some increased cough and discomfort on the left side of his chest when she coughs only.  No fevers or chills.  His symptoms are mild to moderate in severity.  Past Medical History  Diagnosis Date  . Diabetes mellitus   . Hyperlipidemia   . Hypertension   . Myocardial infarction     status post MI x2 and 3 stents placed in 2003  . CHF (congestive heart failure)   . Stroke ~ 2007; ~1987    "weak on right side; messed w/right side of brain; cry all the time"  . Bronchitis 11/13/11    "last time was last week"  . Shortness of breath on exertion   . Orthopnea 11/13/11    "been sleeping in chair last 12 days"  . Sleep apnea     "sleep w/CPAP sometimes"  . Headache 11/13/11    "headaches sometimes; haven't had migraine in long time"  . Chronic kidney disease   . Arthritis   . Anxiety   . Depression   . Type II or unspecified type diabetes mellitus with renal manifestations, uncontrolled(250.42) 09/21/2008    Past Surgical History  Procedure Date  . Coronary stent placement   . Hernia repair     umbilical  . Coronary angioplasty with stent placement ~ 2002    "3"    Family History  Problem Relation Age of Onset  . Asthma Mother   . Hyperlipidemia Mother   . Hypertension Mother   . Stroke Father   . Heart attack Father   . Prostate cancer Father   . Deep vein thrombosis Father   . Cancer Father   . Diabetes Father   .  Hyperlipidemia Father   . Hypertension Father   . Other Father     varicose veins  . Heart disease Father     before age 61  . Other Sister     varicose veins    History  Substance Use Topics  . Smoking status: Former Smoker -- 1.0 packs/day for 15 years    Types: Cigarettes    Quit date: 09/24/1986  . Smokeless tobacco: Never Used  . Alcohol Use: 1.2 oz/week    2 Shots of liquor per week      Review of Systems  All other systems reviewed and are negative.    Allergies  Review of patient's allergies indicates no known allergies.  Home Medications   Current Outpatient Rx  Name  Route  Sig  Dispense  Refill  . ALBUTEROL SULFATE HFA 108 (90 BASE) MCG/ACT IN AERS   Inhalation   Inhale 2 puffs into the lungs every 4 (four) hours as needed. For shortness of breath and wheezing   1 Inhaler   2   . CALCITRIOL 0.25 MCG PO CAPS   Oral   Take 0.25-0.5 mcg by mouth daily. Alternates takes 1 tablet and 2 capsules  the next day         . CARVEDILOL 25 MG PO TABS   Oral   Take 37.5 mg by mouth 2 (two) times daily with a meal.         . CLOPIDOGREL BISULFATE 75 MG PO TABS   Oral   Take 1 tablet (75 mg total) by mouth daily.   30 tablet   5   . DSS 100 MG PO CAPS   Oral   Take 100 mg by mouth 2 (two) times daily as needed for constipation.   10 capsule   0   . FUROSEMIDE 80 MG PO TABS   Oral   Take 160 mg by mouth 2 (two) times daily.         . INSULIN DETEMIR 100 UNIT/ML [place] SOLN   Subcutaneous   Inject 75 Units into the skin 2 (two) times daily. Please give pen if covered. 3 month supply.   10 mL   12   . ISOSORBIDE MONONITRATE ER 60 MG PO TB24   Oral   Take 60 mg by mouth daily.         Marland Kitchen MILK OF MAGNESIA PO   Oral   Take 15 mLs by mouth daily as needed. For constipation         . ROSUVASTATIN CALCIUM 40 MG PO TABS   Oral   Take 40 mg by mouth at bedtime.         Marland Kitchen MEDICAL COMPRESSION STOCKINGS MISC   Does not apply   2 each by Does not  apply route daily.   2 each   1   . INSULIN SYRINGE-NEEDLE U-100 25G X 1" 1 ML MISC   Does not apply   1 applicator by Does not apply route 2 (two) times daily. Please give needles 4 Levemir.   200 each   0     BP 178/84  Pulse 105  Temp 100 F (37.8 C) (Oral)  Resp 24  Ht 6' (1.829 m)  Wt 308 lb (139.708 kg)  BMI 41.77 kg/m2  SpO2 92%  Physical Exam  Nursing note and vitals reviewed. Constitutional: He is oriented to person, place, and time. He appears well-developed and well-nourished.  HENT:  Head: Normocephalic and atraumatic.  Eyes: EOM are normal.  Neck: Normal range of motion.  Cardiovascular: Normal rate, regular rhythm, normal heart sounds and intact distal pulses.   Pulmonary/Chest: Effort normal. He has no wheezes. He has rales.       tachypnea  Abdominal: Soft. He exhibits no distension. There is no tenderness.  Musculoskeletal: Normal range of motion.       2+ leg edema bilaterally  Neurological: He is alert and oriented to person, place, and time.  Skin: Skin is warm and dry.  Psychiatric: He has a normal mood and affect. Judgment normal.    ED Course  Procedures (including critical care time)  Labs Reviewed  CBC WITH DIFFERENTIAL - Abnormal; Notable for the following:    RBC 3.48 (*)     Hemoglobin 9.5 (*)     HCT 30.0 (*)     All other components within normal limits  BASIC METABOLIC PANEL - Abnormal; Notable for the following:    Glucose, Bld 251 (*)     BUN 36 (*)     Creatinine, Ser 5.71 (*)     GFR calc non Af Amer 10 (*)     GFR calc Af Amer 12 (*)  All other components within normal limits  PRO B NATRIURETIC PEPTIDE - Abnormal; Notable for the following:    Pro B Natriuretic peptide (BNP) 7437.0 (*)     All other components within normal limits  TROPONIN I   Dg Chest Portable 1 View  10/11/2012  *RADIOLOGY REPORT*  Clinical Data: Shortness of breath  PORTABLE CHEST - 1 VIEW  Comparison: 08/12/2012 and 11/08/2010  Findings: Very  low lung volumes with mildly prominent cardiopericardial silhouette.  There is central pulmonary vascular congestion.  Diffuse interstitial prominence is seen throughout both lungs.  Interstitial prominence appears worse compared to the chest radiograph of 08/01/2012.  No focal airspace opacity.  No visible pleural effusion in the frontal projection.  Negative for pneumothorax.  Lung apices partially obscured by soft tissues of the patient's neck / face.  IMPRESSION: Cardiomegaly with pulmonary vascular congestion and interstitial opacities, more prominent compared to prior chest radiographs. Findings are most consistent with mild congestive heart failure pattern.  Viral infection is felt to be less likely.   Original Report Authenticated By: Curlene Dolphin, M.D.    I personally reviewed the imaging tests through PACS system I reviewed available ER/hospitalization records through the EMR   1. CHF (congestive heart failure)     Date: 10/11/2012  Rate: 105  Rhythm: sinus tachycardia  QRS Axis: normal  Intervals: normal  ST/T Wave abnormalities: Nonspecific T wave changes  Conduction Disutrbances: none  Narrative Interpretation:   Old EKG Reviewed: No significant changes noted      MDM  This appears to be a CHF exacerbation.  IV Lasix now.  The pt will be admitted for inpatient diuresis.        Hoy Morn, MD 10/11/12 1440

## 2012-10-11 NOTE — ED Notes (Signed)
Jonathon Bailey.MD notified re: lasix 160mg  not given in ED, d/t last dose administered 15:04, informed the order would be reordered for another time

## 2012-10-11 NOTE — H&P (Signed)
TREYSHAUN HASBUN is an 57 y.o. male.   Chief Complaint: difficulty breathing, weight gain and leg swelling.   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   # Dyspnea, orthopnea. Acute CHF exacerbation. May be due to worsening renal disease.  Dry weight ~ 295 lbs. CXR on admission: vascular congestion Pro-BNP on admission: 7437 (4441 on previous admission). -Home:  Lasix 80 mg PO qd written on bottle. His medication reconciliation indicates 160 mg PO bid>>>He received 160 mg IV in the ED and has urinated about 200 mL. We will give Lasix 80 IV bid. -I/O, daily weights  -Renal consultation. Outpatient nephrologist is Dr. Esau Grew; patient has consulted with [facility] for potential AVF.  -He does not seem to have COPD exacerbation at this time. We will hold off on antibiotics and steroids. -Monitor on telemetry.    RENAL # Chronic renal insufficiency, stage 4. He has had worsening renal function over the past few months. His baseline now seems to be 4-5.  -See above  CV # History of CHF # History of hypertension. Fair control.  # History of CAD s/p stents, history of MI x 2 # History of HLD -Consider consulting Heart Failure team due to history of frequent admission. However, current situation may be attributable to worsening renal disease.  -Continue home Coreg 37.5 bid, Plavix 75, Crestor 40, Imdur 60  NEURO # History of stroke with residual right-sided weakness. Stable.   PULM # History of COPD # History of 20+ years tobacco use; quit.  -See above regarding treatment of dyspnea  ENDO # History of Type 2 DM, on insulin, with nephropathy. HgbA1c 07/2012 9.9.  -Continue home insulin -SSI   HPI: He has been  having worsening SOB that started around New Years. He denies respiratory illness, medication non-compliance, dietary changes. He also denies chest pain. He developed a cough yesterday and his left side now hurts when he coughs.   He endorses his normal PO intake of food and liquids.   He had a cardiologist Dr. Claiborne Billings at [facility].  He also had a nephrologist Dr. Detterding whom he saw in [place] and whom has referred the patient for an AVF due to worsening renal disease. Patient has seen [facility] but is undecided about whether he wants AVF.    Past Medical History  Diagnosis Date  . Diabetes mellitus   . Hyperlipidemia   . Hypertension   . Myocardial infarction     status post MI x2 and 3 stents placed in 2003  . CHF (congestive heart failure)   . Stroke ~ 2007; ~1987    "weak on right side; messed w/right side of brain; cry all the time"  . Bronchitis 11/13/11    "last time was last week"  . Shortness of breath on exertion   . Orthopnea 11/13/11    "been sleeping in chair last 12 days"  . Sleep apnea     "sleep w/CPAP sometimes"  . Headache 11/13/11    "headaches sometimes; haven't had migraine in long time"  . Chronic kidney disease   . Arthritis   . Anxiety   . Depression   . Type II or unspecified type diabetes mellitus with renal manifestations, uncontrolled(250.42) 09/21/2008    Past Surgical  History  Procedure Date  . Coronary stent placement   . Hernia repair     umbilical  . Coronary angioplasty with stent placement ~ 2002    "3"    Family History  Problem Relation Age of Onset  . Asthma Mother   . Hyperlipidemia Mother   . Hypertension Mother   . Stroke Father   . Heart attack Father   . Prostate cancer Father   . Deep vein thrombosis Father   . Cancer Father   . Diabetes Father   . Hyperlipidemia Father   . Hypertension Father   . Other Father     varicose veins  . Heart disease Father     before age 53  . Other Sister     varicose veins   Social  History:  reports that he quit smoking about 26 years ago. His smoking use included Cigarettes. He has a 15 pack-year smoking history. He has never used smokeless tobacco. He reports that he drinks about 1.2 ounces of alcohol per week. He reports that he does not use illicit drugs. He lives with his 53 YO son and significant other at home.   Allergies: No Known Allergies  Results for orders placed during the hospital encounter of 10/11/12 (from the past 48 hour(s))  CBC WITH DIFFERENTIAL     Status: Abnormal   Collection Time   10/11/12 12:56 PM      Component Value Range Comment   WBC 8.6  4.0 - 10.5 K/uL    RBC 3.48 (*) 4.22 - 5.81 MIL/uL    Hemoglobin 9.5 (*) 13.0 - 17.0 g/dL    HCT 30.0 (*) 39.0 - 52.0 %    MCV 86.2  78.0 - 100.0 fL    MCH 27.3  26.0 - 34.0 pg    MCHC 31.7  30.0 - 36.0 g/dL    RDW 14.1  11.5 - 15.5 %    Platelets 220  150 - 400 K/uL    Neutrophils Relative 75  43 - 77 %    Neutro Abs 6.5  1.7 - 7.7 K/uL    Lymphocytes Relative 13  12 - 46 %    Lymphs Abs 1.2  0.7 - 4.0 K/uL    Monocytes Relative 11  3 - 12 %    Monocytes Absolute 0.9  0.1 - 1.0 K/uL    Eosinophils Relative 1  0 - 5 %    Eosinophils Absolute 0.1  0.0 - 0.7 K/uL    Basophils Relative 0  0 - 1 %    Basophils Absolute 0.0  0.0 - 0.1 K/uL   BASIC METABOLIC PANEL     Status: Abnormal   Collection Time   10/11/12 12:56 PM      Component Value Range Comment   Sodium 136  135 - 145 mEq/L    Potassium 4.0  3.5 - 5.1 mEq/L    Chloride 102  96 - 112 mEq/L    CO2 20  19 - 32 mEq/L    Glucose, Bld 251 (*) 70 - 99 mg/dL    BUN 36 (*) 6 - 23 mg/dL    Creatinine, Ser 5.71 (*) 0.50 - 1.35 mg/dL    Calcium 8.7  8.4 - 10.5 mg/dL    GFR calc non Af Amer 10 (*) >90 mL/min    GFR calc Af Amer 12 (*) >90 mL/min   TROPONIN I     Status: Normal   Collection Time   10/11/12 12:57  PM      Component Value Range Comment   Troponin I <0.30  <0.30 ng/mL   PRO B NATRIURETIC PEPTIDE     Status: Abnormal   Collection  Time   10/11/12 12:57 PM      Component Value Range Comment   Pro B Natriuretic peptide (BNP) 7437.0 (*) 0 - 125 pg/mL    Dg Chest Portable 1 View  10/11/2012  *RADIOLOGY REPORT*  Clinical Data: Shortness of breath  PORTABLE CHEST - 1 VIEW  Comparison: 08/12/2012 and 11/08/2010  Findings: Very low lung volumes with mildly prominent cardiopericardial silhouette.  There is central pulmonary vascular congestion.  Diffuse interstitial prominence is seen throughout both lungs.  Interstitial prominence appears worse compared to the chest radiograph of 08/01/2012.  No focal airspace opacity.  No visible pleural effusion in the frontal projection.  Negative for pneumothorax.  Lung apices partially obscured by soft tissues of the patient's neck / face.  IMPRESSION: Cardiomegaly with pulmonary vascular congestion and interstitial opacities, more prominent compared to prior chest radiographs. Findings are most consistent with mild congestive heart failure pattern.  Viral infection is felt to be less likely.   Original Report Authenticated By: Curlene Dolphin, M.D.     ROS Per HPI with inclusion of following: Denies abdominal pain, constipation, dysuria, headache, vision or hearing changes, fevers, chills, nausea/vomiting/diarrhea  Blood pressure 178/84, pulse 105, temperature 100 F (37.8 C), temperature source Oral, resp. rate 24, height 6' (1.829 m), weight 308 lb (139.708 kg), SpO2 92.00%. Physical Exam  Gen: working hard to breathe, morbidly obese PSYCH: engaged and appropriate to questions; fully alert and oriented CV: RRR, normal S1/S2, no m/r/g auscultated but heart sounds difficult to hear due to breath sounds PULM: tachypneic, ronchorous/crackles throughout, no obvious wheezing, fair air movement,  ABD: soft, NT, obese, NABS EXT: 1-2+ pitting pretibial edema SKIN: warm, dry   OH PARK, ANGELA 10/11/2012, 3:50 PM

## 2012-10-11 NOTE — Consult Note (Signed)
Jonathon Bailey 10/11/2012 Jonathon Bailey Requesting Physician:  Dr. McDiarmid  Reason for Consult:  CKD patient with CHF exacerbation HPI: The patient is a 57 y.o. year-old with hx of CKD stage IV-V, CVA with R sided weakness, CAD with hx MI and 3 stents in place, and DM and HTN, who presented to ED today with c/o dyspnea and orthopnea. CXR showed vascular congestion. Patient has no other complaints.  ROS  no abd pain, nausea or diarrhea  no vomiting  no fatigue prior to acute illness  no dysuria or voiding difficulty  no HA or visual change   Past Medical History:  Past Medical History  Diagnosis Date  . Diabetes mellitus   . Hyperlipidemia   . Hypertension   . Myocardial infarction     status post MI x2 and 3 stents placed in 2003  . Stroke ~ 2007; ~1987    "weak on right side; messed w/right side of brain; cry all the time"  . Sleep apnea     "sleep w/CPAP sometimes"  . Arthritis   . Anxiety   . Depression   . Chronic systolic heart failure [id number]    ECHO Feb 2013 showed LVEF low normal at 50-55%, +hypokinetic anterolateral wall and inferolateral wall.    . CKD (chronic kidney disease) stage 4, GFR 15-29 ml/min 08/30/2009    Progressive renal failure since 2008, creatinine 1.2 in 2008 up to 3.5 in 2012 and 3.2-5.0 in 2013. All UA's 2011-13 showed >300 protein on dipstick. Work-up in May 2011 showed negative Urine IFE and SPEP, ultrasound showed 12-13 cm kidneys with increased echogenicity and UPC ratio was 1.5 gm proteinuria.  Hgb A1C's from 2011 to 2013 were all between 9-11.  Patient saw Dr. Donnetta Bailey (vasc surgery) for HD access in Aug 2013 > vein mapping was done and Dr. Donnetta Bailey felt the left arm (pt is R handed) was suitable for L arm Cimino radiocephalic fistula. Patient said he wasn't ready to consider doing dialysis and declined the surgery.       Past Surgical History:  Past Surgical History  Procedure Date  . Coronary stent placement   . Hernia repair    umbilical  . Coronary angioplasty with stent placement ~ 2002    "3"    Family History:  Family History  Problem Relation Age of Onset  . Asthma Mother   . Hyperlipidemia Mother   . Hypertension Mother   . Stroke Father   . Heart attack Father   . Prostate cancer Father   . Deep vein thrombosis Father   . Cancer Father   . Diabetes Father   . Hyperlipidemia Father   . Hypertension Father   . Other Father     varicose veins  . Heart disease Father     before age 46  . Other Sister     varicose veins   Social History:  reports that he quit smoking about 26 years ago. His smoking use included Cigarettes. He has a 15 pack-year smoking history. He has never used smokeless tobacco. He reports that he drinks about 1.2 ounces of alcohol per week. He reports that he does not use illicit drugs.  Allergies: No Known Allergies  Home medications: Prior to Admission medications   Medication Sig Start Date End Date Taking? Authorizing Provider  albuterol (PROVENTIL HFA;VENTOLIN HFA) 108 (90 BASE) MCG/ACT inhaler Inhale 2 puffs into the lungs every 4 (four) hours as needed. For shortness of breath and wheezing 05/23/12  Yes  Amber Fidel Levy, MD  calcitRIOL (ROCALTROL) 0.25 MCG capsule Take 0.25-0.5 mcg by mouth daily. Alternates takes 1 tablet and 2 capsules the next day   Yes Historical Provider, MD  carvedilol (COREG) 25 MG tablet Take 37.5 mg by mouth 2 (two) times daily with a meal.   Yes Historical Provider, MD  clopidogrel (PLAVIX) 75 MG tablet Take 1 tablet (75 mg total) by mouth daily. 08/25/12  Yes Amber Fidel Levy, MD  docusate sodium 100 MG CAPS Take 100 mg by mouth 2 (two) times daily as needed for constipation. 08/13/12  Yes Hilton Sinclair, MD  furosemide (LASIX) 80 MG tablet Take 160 mg by mouth 2 (two) times daily.   Yes Historical Provider, MD  insulin detemir (LEVEMIR) 100 UNIT/ML injection Inject 75 Units into the skin 2 (two) times daily. Please give pen if covered. 3  month supply. 02/21/12 02/20/13 Yes Lyndal Pulley, DO  isosorbide mononitrate (IMDUR) 60 MG 24 hr tablet Take 60 mg by mouth daily. 08/15/12  Yes Amber Fidel Levy, MD  Magnesium Hydroxide (MILK OF MAGNESIA PO) Take 15 mLs by mouth daily as needed. For constipation   Yes Historical Provider, MD  rosuvastatin (CRESTOR) 40 MG tablet Take 40 mg by mouth at bedtime.   Yes Historical Provider, MD  Elastic Bandages & Supports (MEDICAL COMPRESSION STOCKINGS) [place] 2 each by Does not apply route daily. 08/15/12   Amber Fidel Levy, MD  Insulin Syringe-Needle U-100 25G X 1" 1 ML MISC 1 applicator by Does not apply route 2 (two) times daily. Please give needles 4 Levemir. 02/21/12   Lyndal Pulley, DO    Labs: Basic Metabolic Panel:  Lab [id number] 1256  NA 136  K 4.0  CL 102  CO2 20  GLUCOSE 251*  BUN 36*  CREATININE 5.71*  ALB --  CALCIUM 8.7  PHOS --   Liver Function Tests: No results found for this basename: AST:3,ALT:3,ALKPHOS:3,BILITOT:3,PROT:3,ALBUMIN:3 in the last 168 hours No results found for this basename: LIPASE:3,AMYLASE:3 in the last 168 hours No results found for this basename: AMMONIA:3 in the last 168 hours CBC:  Lab 10/11/12 1256  WBC 8.6  NEUTROABS 6.5  HGB 9.5*  HCT 30.0*  MCV 86.2  PLT 220   PT/INR: @labrcntip (inr:5) Cardiac Enzymes:  Lab 10/11/12 1257  CKTOTAL --  CKMB --  CKMBINDEX --  TROPONINI <0.30   CBG: No results found for this basename: GLUCAP:5 in the last 168 hours   Physical Exam:  Blood pressure 165/98, pulse 108, temperature 98.9 F (37.2 C), temperature source Oral, resp. rate 20, height 6' (1.829 m), weight 134.3 kg (296 lb 1.2 oz), SpO2 94.00%.  Gen: obese pleasant AAM in no distress, breathing is loud and coarse Skin: no rash, cyanosis HEENT:  EOMI, sclera anicteric, throat clear Neck: ++ JVD, no LAN Chest: bilat basilar crackles coarse, and coarse bilat prolonged exp wheezing CV: regular, no rub or gallop, no carotid bruits, pedal  pulses intact Abdomen: soft, obese, liver down 5 cm, ? ascites Ext: 2-3+ LE edema bilat, +sacral edema 2+, no joint effusion or deformity, no gangrene or ulceration Neuro: alert, Ox3, no focal deficit, no asterixis   Impression/Plan 1. CKD stage V, est GFR 34ml/min. F/B Dr. Jimmy Footman at [facility].  Has seen [facility] for access but would not have one placed yet (in Aug 2013). Not grossly uremic, having problems with fluid overload primarily. He had two admissions in 2013 for CHF exacerbation/vol overload. Will maximize diuretics with lasix 160 q 6hr  IV.  No indication for acute HD. Patient said he will consider having the AVF placed this admission which I recommended after resp status has improved.  2. HTN/volume- on Coreg only, marked edema. See above 3. DM 2 4. CAD w hx MI/stents 5. Hx CVA  Will follow.   [name]  MD [employer] [phone] pgr    [phone] cell 10/11/2012, 8:08 PM

## 2012-10-12 ENCOUNTER — Inpatient Hospital Stay (HOSPITAL_COMMUNITY): Payer: Medicare Other

## 2012-10-12 ENCOUNTER — Encounter (HOSPITAL_COMMUNITY): Payer: Self-pay | Admitting: *Deleted

## 2012-10-12 DIAGNOSIS — R609 Edema, unspecified: Secondary | ICD-10-CM

## 2012-10-12 DIAGNOSIS — R0602 Shortness of breath: Secondary | ICD-10-CM

## 2012-10-12 DIAGNOSIS — I824Z9 Acute embolism and thrombosis of unspecified deep veins of unspecified distal lower extremity: Secondary | ICD-10-CM

## 2012-10-12 DIAGNOSIS — I5033 Acute on chronic diastolic (congestive) heart failure: Secondary | ICD-10-CM

## 2012-10-12 DIAGNOSIS — N184 Chronic kidney disease, stage 4 (severe): Secondary | ICD-10-CM

## 2012-10-12 LAB — RENAL FUNCTION PANEL
BUN: 39 mg/dL — ABNORMAL HIGH (ref 6–23)
CO2: 21 mEq/L (ref 19–32)
Calcium: 8.7 mg/dL (ref 8.4–10.5)
GFR calc Af Amer: 11 mL/min — ABNORMAL LOW (ref >90)
Glucose, Bld: 165 mg/dL — ABNORMAL HIGH (ref 70–99)
Phosphorus: 3.9 mg/dL (ref 2.3–4.6)
Potassium: 4 mEq/L (ref 3.5–5.1)

## 2012-10-12 LAB — URINALYSIS, ROUTINE W REFLEX MICROSCOPIC
Bilirubin Urine: NEGATIVE
Specific Gravity, Urine: 1.018 (ref 1.005–1.030)
Urobilinogen, UA: 1 mg/dL (ref 0.0–1.0)
pH: 6 (ref 5.0–8.0)

## 2012-10-12 LAB — GLUCOSE, CAPILLARY

## 2012-10-12 LAB — URINE MICROSCOPIC-ADD ON

## 2012-10-12 MED ORDER — TECHNETIUM TO 99M ALBUMIN AGGREGATED
6.0000 | Freq: Once | INTRAVENOUS | Status: AC | PRN
  Administered 2012-10-12: 6 via INTRAVENOUS

## 2012-10-12 MED ORDER — VANCOMYCIN HCL 10 G IV SOLR
2500.0000 mg | Freq: Once | INTRAVENOUS | Status: AC
  Administered 2012-10-12: 2500 mg via INTRAVENOUS
  Filled 2012-10-12: qty 2500

## 2012-10-12 MED ORDER — PIPERACILLIN-TAZOBACTAM IN DEX 2-0.25 GM/50ML IV SOLN
2.2500 g | Freq: Four times a day (QID) | INTRAVENOUS | Status: DC
  Administered 2012-10-12 – 2012-10-13 (×4): 2.25 g via INTRAVENOUS
  Filled 2012-10-12 (×6): qty 50

## 2012-10-12 MED ORDER — TECHNETIUM TC 99M DIETHYLENETRIAME-PENTAACETIC ACID
40.0000 | Freq: Once | INTRAVENOUS | Status: AC | PRN
  Administered 2012-10-12: 40 via RESPIRATORY_TRACT

## 2012-10-12 MED ORDER — VANCOMYCIN HCL 10 G IV SOLR: 1750.0000 mg | INTRAVENOUS | Status: DC

## 2012-10-12 MED ORDER — CARVEDILOL 6.25 MG PO TABS
6.2500 mg | ORAL_TABLET | Freq: Two times a day (BID) | ORAL | Status: DC
  Administered 2012-10-12 – 2012-10-15 (×6): 6.25 mg via ORAL
  Filled 2012-10-12 (×8): qty 1

## 2012-10-12 MED ORDER — ENOXAPARIN SODIUM 150 MG/ML ~~LOC~~ SOLN
130.0000 mg | SUBCUTANEOUS | Status: DC
  Administered 2012-10-12 – 2012-10-15 (×4): 130 mg via SUBCUTANEOUS
  Filled 2012-10-12 (×5): qty 1

## 2012-10-12 NOTE — Progress Notes (Signed)
Subjective: Says breathing about the same. 900 cc UOP recorded, weight down 4 kg though  Objective Vital signs in last 24 hours: Filed Vitals:   10/12/12 0126 10/12/12 0222 10/12/12 0235 10/12/12 0500  BP:  130/80  128/78  Pulse:  106  102  Temp: 99.2 F (37.3 C) 100.7 F (38.2 C)  98.1 F (36.7 C)  TempSrc:  Oral  Oral  Resp:  24  20  Height:      Weight:    134.446 kg (296 lb 6.4 oz)  SpO2:  91% 96% 93%   Weight change:   Intake/Output Summary (Last 24 hours) at 10/12/12 0803 Last data filed at 10/12/12 0300  Gross per 24 hour  Intake    840 ml  Output    900 ml  Net    -60 ml   Labs: Basic Metabolic Panel:  Lab [id number] 0444 10/11/12 1256  NA 136 136  K 4.0 4.0  CL 103 102  CO2 21 20  GLUCOSE 165* 251*  BUN 39* 36*  CREATININE 6.16* 5.71*  ALB -- --  CALCIUM 8.7 8.7  PHOS 3.9 --   Liver Function Tests:  Lab 10/12/12 0444  AST --  ALT --  ALKPHOS --  BILITOT --  PROT --  ALBUMIN 2.2*   No results found for this basename: LIPASE:3,AMYLASE:3 in the last 168 hours No results found for this basename: AMMONIA:3 in the last 168 hours CBC:  Lab 10/11/12 1256  WBC 8.6  NEUTROABS 6.5  HGB 9.5*  HCT 30.0*  MCV 86.2  PLT 220   PT/INR: @labrcntip (inr:5) Cardiac Enzymes:  Lab 10/11/12 1257  CKTOTAL --  CKMB --  CKMBINDEX --  TROPONINI <0.30   CBG:  Lab 10/11/12 2231  GLUCAP 218*    Iron Studies: No results found for this basename: IRON:30,TIBC:30,TRANSFERRIN:30,FERRITIN:30 in the last 168 hours  Physical Exam:  Blood pressure 128/78, pulse 102, temperature 98.1 F (36.7 C), temperature source Oral, resp. rate 20, height 6' (1.829 m), weight 134.446 kg (296 lb 6.4 oz), SpO2 93.00%.  Physical Exam: Blood pressure 165/98, pulse 108, temperature 98.9 F (37.2 C), temperature source Oral, resp. rate 20, height 6' (1.829 m), weight 134.3 kg (296 lb 1.2 oz), SpO2 94.00%.  Gen: obese pleasant AAM in no distress, breathing is loud and coarse  Skin:  no rash, cyanosis  HEENT: EOMI, sclera anicteric, throat clear  Neck: ++ JVD, no LAN  Chest: bilat basilar crackles coarse, and coarse bilat prolonged exp wheezing  CV: regular, no rub or gallop, no carotid bruits, pedal pulses intact  Abdomen: soft, obese, liver down 5 cm, ? ascites  Ext: 2 + LE edema bilat, +sacral edema 2+, no joint effusion or deformity, no gangrene or ulceration  Neuro: alert, Ox3, no focal deficit, no asterixis   Impression/Plan  1. CKD stage V - not grossly uremic, having problems with fluid overload primarily.  Is agreeable to AVF placement now (has already been eval by Dr Donnetta Hutching in Aug 2013), perhaps this can be done after diuresis completed and before d/c. Will plan to call [facility] on Monday. On max diuretics, hopefully will respond.  2. Pulm edema / CHF / volume excess- on Coreg only, +vol excess w mild pulm edema, diuresing 3. DM 2 4. CAD w hx MI/stents 5. Hx CVA    [name]  MD [facility] [phone] pgr    [phone] cell 10/12/2012, 8:03 AM

## 2012-10-12 NOTE — Progress Notes (Signed)
0820 BPS ..< 90 referred to Dr. Billee Cashing d iarmid  With orders . Dr [address] [employer] . Pt monitored

## 2012-10-12 NOTE — Progress Notes (Signed)
Interim Note  Paged by nursing, pt with DVT in RLE by Dopplers. Reviewed VQ scan, no evidence for PE. Now on therapeutic Lovenox per pharmacy consult. VSS, sats >98% on 2L [place]. Continue Lovenox for now, will make plans to transition to appropriate long-term anticoagulation with further work-up/management, as appropriate. Dr. Barbra Sarks (PGY-3) aware, also.  Emmaline Kluver, MD PGY-1, [place] [facility] pager: [phone]

## 2012-10-12 NOTE — H&P (Signed)
I have seen and examined this patient. I have discussed with Dr Verdie Drown.  I agree with their findings and plans as documented in their admission note.  Acute Issues 1. Dyspnea on exertion - (+) cough, left-sided chest pain with cough.  - (+) orthopnea, lower extremity edema bilaterally - peripheral edema and pulmonary edema - Pt with ischemic Heart Failure with preserved Ejection Fraction by Echocardiogram (02/13). Cardiologist at [facility]. - Elevated ProBNP from prior levels.  - Temps of 102 and 100.7 overnight - Low BP this morning around 0830.  Pt had received lasix and Coreg prior to event.  - Possible left lower lung field infiltrate by my interpreation of CHEST XRAY (though not reported as such by radiologist). Radiology interpreted as vascular congestion with interstitial opacities.  - Patient started on Lasix 160 mg IV every six hours. - Diff diagnosis include CHF, pneumonia, Pulm embolism  2.  CKD- Stage Iv to V. - Volume overload clinically - handling acids and electrolyes currently.   Plan - empiric coverage possible sepsis with Vanc/Zosyn with possible pulmonary source. - Check urinalysis - Follow up Urine and blood cultures - Rule out VTE with V/Q scan and Lower extremity venous doppler - Empiric lovenox for VTE until VTE event ruled out. - Continue high-dose furosemide - Check Echocardiogram to look for diminishment of ejection fraction.   -

## 2012-10-12 NOTE — Progress Notes (Signed)
I have seen and examined this patient. I have discussed with Dr Verdie Drown.  I agree with their findings and plans as documented in their progress note.

## 2012-10-12 NOTE — Progress Notes (Signed)
Subjective: He reports his shortness of breath is unchanged He denies chest pain or other complaints  Objective: Vital signs in last 24 hours: Temp:  [98.1 F (36.7 C)-102.6 F (39.2 C)] 98.6 F (37 C) (01/19 0805) Pulse Rate:  [79-108] 88  (01/19 0846) Resp:  [20-24] 22  (01/19 0846) BP: (79-178)/(48-98) 112/67 mmHg (01/19 0846) SpO2:  [91 %-99 %] 94 % (01/19 0820) FiO2 (%):  [2 %] 2 % (01/18 1254) Weight:  [296 lb 1.2 oz (134.3 kg)-308 lb (139.708 kg)] 296 lb 6.4 oz (134.446 kg) (01/19 0500)  Intake/Output from previous day: 01/18 0701 - 01/19 0700 In: 840 [P.O.:840] Out: 900 [Urine:900] Intake/Output this shift: Total I/O In: 600 [P.O.:600] Out: 200 [Urine:200]  Physical exam: GEN: mildly increased work of breathing sitting in chair with legs elevated PSYCH: appears depressed CV: RRR PULM: no obvious wheezing; fair aeration; coarse breath sounds with crackles throughout EXT: soft, NT, obese EXT: 2+ pitting pretibial edema  Results for orders placed during the hospital encounter of 10/11/12 (from the past 24 hour(s))  CBC WITH DIFFERENTIAL     Status: Abnormal   Collection Time   10/11/12 12:56 PM      Component Value Range   WBC 8.6  4.0 - 10.5 K/uL   RBC 3.48 (*) 4.22 - 5.81 MIL/uL   Hemoglobin 9.5 (*) 13.0 - 17.0 g/dL   HCT 30.0 (*) 39.0 - 52.0 %   MCV 86.2  78.0 - 100.0 fL   MCH 27.3  26.0 - 34.0 pg   MCHC 31.7  30.0 - 36.0 g/dL   RDW 14.1  11.5 - 15.5 %   Platelets 220  150 - 400 K/uL   Neutrophils Relative 75  43 - 77 %   Neutro Abs 6.5  1.7 - 7.7 K/uL   Lymphocytes Relative 13  12 - 46 %   Lymphs Abs 1.2  0.7 - 4.0 K/uL   Monocytes Relative 11  3 - 12 %   Monocytes Absolute 0.9  0.1 - 1.0 K/uL   Eosinophils Relative 1  0 - 5 %   Eosinophils Absolute 0.1  0.0 - 0.7 K/uL   Basophils Relative 0  0 - 1 %   Basophils Absolute 0.0  0.0 - 0.1 K/uL  BASIC METABOLIC PANEL     Status: Abnormal   Collection Time   10/11/12 12:56 PM      Component Value Range     Sodium 136  135 - 145 mEq/L   Potassium 4.0  3.5 - 5.1 mEq/L   Chloride 102  96 - 112 mEq/L   CO2 20  19 - 32 mEq/L   Glucose, Bld 251 (*) 70 - 99 mg/dL   BUN 36 (*) 6 - 23 mg/dL   Creatinine, Ser 5.71 (*) 0.50 - 1.35 mg/dL   Calcium 8.7  8.4 - 10.5 mg/dL   GFR calc non Af Amer 10 (*) >90 mL/min   GFR calc Af Amer 12 (*) >90 mL/min  TROPONIN I     Status: Normal   Collection Time   10/11/12 12:57 PM      Component Value Range   Troponin I <0.30  <0.30 ng/mL  PRO B NATRIURETIC PEPTIDE     Status: Abnormal   Collection Time   10/11/12 12:57 PM      Component Value Range   Pro B Natriuretic peptide (BNP) 7437.0 (*) 0 - 125 pg/mL  MRSA PCR SCREENING     Status: Normal  Collection Time   10/11/12  7:07 PM      Component Value Range   MRSA by PCR NEGATIVE  NEGATIVE  GLUCOSE, CAPILLARY     Status: Abnormal   Collection Time   10/11/12 10:31 PM      Component Value Range   Glucose-Capillary 218 (*) 70 - 99 mg/dL  RENAL FUNCTION PANEL     Status: Abnormal   Collection Time   10/12/12  4:44 AM      Component Value Range   Sodium 136  135 - 145 mEq/L   Potassium 4.0  3.5 - 5.1 mEq/L   Chloride 103  96 - 112 mEq/L   CO2 21  19 - 32 mEq/L   Glucose, Bld 165 (*) 70 - 99 mg/dL   BUN 39 (*) 6 - 23 mg/dL   Creatinine, Ser 6.16 (*) 0.50 - 1.35 mg/dL   Calcium 8.7  8.4 - 10.5 mg/dL   Phosphorus 3.9  2.3 - 4.6 mg/dL   Albumin 2.2 (*) 3.5 - 5.2 g/dL   GFR calc non Af Amer 9 (*) >90 mL/min   GFR calc Af Amer 11 (*) >90 mL/min    Studies/Results: Dg Chest 2 View  10/12/2012  *RADIOLOGY REPORT*  Clinical Data: Follow up of CHF.  Shortness of breath.  CHEST - 2 VIEW  Comparison: 1 day prior  Findings: Midline trachea.  Normal heart size and mediastinal contours. No pleural effusion or pneumothorax.  Improved interstitial prominence. Clear lungs.  IMPRESSION: Improved to resolved pulmonary venous congestion.   Original Report Authenticated By: [name], M.D.    Dg Chest Portable 1  View  10/11/2012  *RADIOLOGY REPORT*  Clinical Data: Shortness of breath  PORTABLE CHEST - 1 VIEW  Comparison: 08/12/2012 and 11/08/2010  Findings: Very low lung volumes with mildly prominent cardiopericardial silhouette.  There is central pulmonary vascular congestion.  Diffuse interstitial prominence is seen throughout both lungs.  Interstitial prominence appears worse compared to the chest radiograph of 08/01/2012.  No focal airspace opacity.  No visible pleural effusion in the frontal projection.  Negative for pneumothorax.  Lung apices partially obscured by soft tissues of the patient's neck / face.  IMPRESSION: Cardiomegaly with pulmonary vascular congestion and interstitial opacities, more prominent compared to prior chest radiographs. Findings are most consistent with mild congestive heart failure pattern.  Viral infection is felt to be less likely.   Original Report Authenticated By: Curlene Dolphin, M.D.     Scheduled Meds:   . atorvastatin  80 mg Oral q1800  . calcitRIOL  0.25 mcg Oral Daily  . carvedilol  6.25 mg Oral BID WC  . clopidogrel  75 mg Oral Daily  . furosemide  160 mg Intravenous Q6H  . heparin  5,000 Units Subcutaneous Q8H  . insulin aspart  0-15 Units Subcutaneous TID WC  . insulin detemir  75 Units Subcutaneous BID  . potassium chloride  20 mEq Oral BID  . 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   # Dyspnea, orthopnea. Acute CHF exacerbation likely due to worsening chronic renal insufficiency. Dry weight ~ 295 lbs.  CXR on  admission: vascular congestion  Pro-BNP on admission: 7437 (4441 on previous admission).  Home: Lasix 80 mg PO qd written on bottle. His medication reconciliation indicates 160 mg PO bid. # Chronic renal insufficiency, stage 4-5. He has had worsening renal function over the past few months. His baseline now seems to be 4-5.  Outpatient nephrologist is Dr. Jimmy Footman. Evaluated by Dr. Donnetta Hutching ([facility]) for AVF 04/2012.   -We appreciate renal consultation.    -Lasix 160 mg IV q 6 hrs. He has not diuresed well on this dose and he does not appear subjectively better, although his weight is improved and CXR shows improved pulmonary vascular congestion.   -Patient amenable to AVF. Renal will consult [facility] 01/20.  # Fever. 102.6 @ 2200 that resolved spontaneously 30 minutes later. No more fevers since then.  -Cause unclear. Blood and urine cultures pending.  -Check urinalysis.   CV  # History of CHF  # History of hypertension. Fair control.  # History of CAD s/p stents, history of MI x 2  # History of HLD  -Consider consulting Heart Failure team due to history of frequent admission. However, current situation may be attributable to worsening renal disease.  -Continue home Coreg 37.5 bid, Plavix 75, Crestor 40, Imdur 60   NEURO  # History of stroke with residual right-sided weakness. Stable.   PULM # History of COPD  # History of 20+ years tobacco use; quit.  -See above regarding treatment of dyspnea   ENDO  # History of Type 2 DM, on insulin, with nephropathy. HgbA1c 07/2012 9.9.  -Continue home insulin  -SSI  DISPO: pending renal recommendations and clinical improvement.     LOS: 1 day   [place] PARK, Shanaya Schneck

## 2012-10-12 NOTE — Progress Notes (Signed)
1520 called and spoken with Dr . Venetia Maxon re: dvt R gastrocnemius  As  Reported by vascular tech and documented in progress note He'll contact Dr Alla German

## 2012-10-12 NOTE — Consult Note (Addendum)
ANTICOAGULATION/ANTIBIOTIC CONSULT NOTE - Initial Consult  Pharmacy Consult for Lovenox and Vancomycin Indication: R/O PE, suspected pneumonia  No Known Allergies  Patient Measurements: Height: 6' (182.9 cm) Weight: 296 lb 6.4 oz (134.446 kg) IBW/kg (Calculated) : 77.6   Vital Signs: Temp: 98.6 F (37 C) (01/19 0805) Temp src: Oral (01/19 0805) BP: 112/67 mmHg (01/19 0846) Pulse Rate: 88  (01/19 0846)  Labs:  Basename 10/12/12 0444 10/11/12 1257 10/11/12 1256  HGB -- -- 9.5*  HCT -- -- 30.0*  PLT -- -- 220  APTT -- -- --  LABPROT -- -- --  INR -- -- --  HEPARINUNFRC -- -- --  CREATININE 6.16* -- 5.71*  CKTOTAL -- -- --  CKMB -- -- --  TROPONINI -- <0.30 --    Estimated Creatinine Clearance: 19.2 ml/min (by C-G formula based on Cr of 6.16).   Medical History: Past Medical History  Diagnosis Date  . Diabetes mellitus   . Hyperlipidemia   . Hypertension   . Myocardial infarction     status post MI x2 and 3 stents placed in 2003  . Stroke ~ 2007; ~1987    "weak on right side; messed w/right side of brain; cry all the time"  . Sleep apnea     "sleep w/CPAP sometimes"  . Arthritis   . Anxiety   . Depression   . Chronic systolic heart failure [id number]    ECHO Feb 2013 showed LVEF low normal at 50-55%, +hypokinetic anterolateral wall and inferolateral wall.    . CKD (chronic kidney disease) stage 4, GFR 15-29 ml/min 08/30/2009    Progressive renal failure since 2008, creatinine 1.2 in 2008 up to 3.5 in 2012 and 3.2-5.0 in 2013. All UA's 2011-13 showed >300 protein on dipstick. Work-up in May 2011 showed negative Urine IFE and SPEP, ultrasound showed 12-13 cm kidneys with increased echogenicity and UPC ratio was 1.5 gm proteinuria.  Hgb A1C's from 2011 to 2013 were all between 9-11.  Patient saw Dr. Donnetta Hutching (vasc surgery) for HD access in Aug 2013 > vein mapping was done and Dr. Donnetta Hutching felt the left arm (pt is R handed) was suitable for L arm Cimino radiocephalic fistula.  Patient said he wasn't ready to consider doing dialysis and declined the surgery.       Medications:  No anticoagulants pta Was on sq heparin for px - last dose 1/19 @ 0624  Assessment: 56yom presents to the ED with dyspnea, most likely related to CHF exacerbation. He will begin full dose lovenox for possible PE - VQ scan ordered. Baseline Hgb low (9.5) and platelets ok (220). He will also begin vancomycin and zosyn for suspected pneumonia, although CXR is most consistent with mild CHF.  Has history of CKD. SCr elevated at 6, CrCl 19. Trending up with aggressive diuresis but no acute need for HD at this time. Plan for [facility] consult 1/20 for AVF placement.  Goal of Therapy:  Anti-Xa level 0.6-1.2 units/ml 4hrs after LMWH dose given Monitor platelets by anticoagulation protocol: Yes Vancomycin trough 15-20   Plan:  1) Change sq heparin to lovenox 130mg  sq q24 (1mg /kg q24 for CrCl < 34ml/min) 2) CBC q72h while on lovenox 3) Follow up VQ scan 4) Follow up [facility] consult and need to hold anticoagulation 5) Vancomycin 2500mg  x 1 then 1750mg  q48 6) Agree with Zosyn 2.25g IV q6 7) Follow renal function, cultures, troughs as indicated  Deboraha Sprang 10/12/2012,9:07 AM

## 2012-10-12 NOTE — Progress Notes (Signed)
Right:  DVT noted in the gastrocnemius.  No evidence of superficial thrombosis.  No Baker's cyst.  Left:  No evidence of DVT, superficial thrombosis, or Baker's cyst.

## 2012-10-13 ENCOUNTER — Inpatient Hospital Stay (HOSPITAL_COMMUNITY): Payer: Medicare Other

## 2012-10-13 DIAGNOSIS — N186 End stage renal disease: Secondary | ICD-10-CM

## 2012-10-13 LAB — GLUCOSE, CAPILLARY
Glucose-Capillary: 79 mg/dL (ref 70–99)
Glucose-Capillary: 111 mg/dL — ABNORMAL HIGH (ref 70–99)
Glucose-Capillary: 139 mg/dL — ABNORMAL HIGH (ref 70–99)
Glucose-Capillary: 68 mg/dL — ABNORMAL LOW (ref 70–99)

## 2012-10-13 LAB — RENAL FUNCTION PANEL
CO2: 24 mEq/L (ref 19–32)
Calcium: 8.5 mg/dL (ref 8.4–10.5)
Chloride: 104 mEq/L (ref 96–112)
GFR calc Af Amer: 9 mL/min — ABNORMAL LOW (ref >90)
GFR calc non Af Amer: 8 mL/min — ABNORMAL LOW (ref >90)
Sodium: 140 mEq/L (ref 135–145)

## 2012-10-13 MED ORDER — HYDRALAZINE HCL 10 MG PO TABS
10.0000 mg | ORAL_TABLET | Freq: Three times a day (TID) | ORAL | Status: DC
  Administered 2012-10-13 – 2012-10-14 (×3): 10 mg via ORAL
  Filled 2012-10-13 (×6): qty 1

## 2012-10-13 MED ORDER — INSULIN DETEMIR 100 UNIT/ML ~~LOC~~ SOLN
65.0000 [IU] | Freq: Two times a day (BID) | SUBCUTANEOUS | Status: DC
  Administered 2012-10-13 – 2012-10-16 (×8): 65 [IU] via SUBCUTANEOUS

## 2012-10-13 MED ORDER — FUROSEMIDE 10 MG/ML IJ SOLN
160.0000 mg | Freq: Two times a day (BID) | INTRAVENOUS | Status: DC
  Administered 2012-10-13 – 2012-10-14 (×3): 160 mg via INTRAVENOUS
  Filled 2012-10-13 (×5): qty 16

## 2012-10-13 NOTE — Progress Notes (Signed)
Family Medicine Teaching Service Daily Progress Note Service Page: [phone]  Subjective:  No acute overnight events. Reports L side/flank pain this am.  States it is associated with coughing. No reports of chest pain or SOB.  Patient is not requiring supplemental oxygen.  Objective: Temp:  [98.1 F (36.7 C)-98.9 F (37.2 C)] 98.9 F (37.2 C) (01/20 0618) Pulse Rate:  [76-86] 82  (01/20 0904) Resp:  [18-20] 20  (01/20 0618) BP: (117-133)/(66-80) 121/80 mmHg (01/20 0904) SpO2:  [92 %-99 %] 95 % (01/20 0618) Weight:  [295 lb (133.811 kg)] 295 lb (133.811 kg) (01/20 0618)  Intake/Output Summary (Last 24 hours) at 10/13/12 1135 Last data filed at 10/13/12 1100  Gross per 24 hour  Intake   1648 ml  Output   2775 ml  Net  -1127 ml   Physical Exam: General: sitting up in chair this am, talking on the phone. Cardiovascular: RRR. No murmurs, rubs, or gallops. Respiratory: coarse crackles bilaterally, particularly bibasilar.  Poor air movement. Abdomen: obese, soft, nontender. Extremities: 2+ pitting edema.  CBC BMET   Lab 10/11/12 1256  WBC 8.6  HGB 9.5*  HCT 30.0*  PLT 220    Lab 10/13/12 0610 10/12/12 0444 10/11/12 1256  NA 140 136 136  K 3.8 4.0 4.0  CL 104 103 102  CO2 24 21 20   BUN 49* 39* 36*  CREATININE 7.13* 6.16* 5.71*  GLUCOSE 55* 165* 251*  CALCIUM 8.5 8.7 8.7     Imaging/Diagnostic Tests:  LE Doppler: DVT noted in the gastrocnemius  Dg Chest 2 View 10/12/2012 IMPRESSION: Improved to resolved pulmonary venous congestion.    Nm Pulmonary Perf And Vent 10/12/2012  IMPRESSION: No evidence of pulmonary embolism.   Dg Chest Portable 1 View 10/11/2012 IMPRESSION: Cardiomegaly with pulmonary vascular congestion and interstitial opacities, more prominent compared to prior chest radiographs. Findings are most consistent with mild congestive heart failure pattern.  Viral infection is felt to be less likely.  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   # Dyspnea - Likely secondary to Acute diastolic CHF exacerbation - Chest xray and elevated proBNP UP:[id number]) consistent with CHF exacerbation.  VQ scan obtained to assess for PE.  Scan was negative. - Patient has diuresing well now (1127 over the past 24 hours) - Discussed case with Dr. Paulla Fore (Family medicine resident who is on Nephrology service).  Will decrease IV Lasix to 160 mg Q12. - Renal consulting [facility] today.  # Lower extremity DVT - LE dopplers showed RLE DVT. - Patient on therapeutic Lovenox 130 mg daily (pharmacy monitoring)  # Fever- 102.6 @ 2200 on 1/18.  May be secondary to LE DVT. - Blood cultures and urine cultures obtained.   - Patient did have episode of fever overnight (0200 - 100.7) - Blood cultures returned negative to date.  Vanc and Zosyn discontinued.   # CKD, stage 5. - Worsening renal function/creatinine.  Creatinine 5 prior to admission.  Creatinine 7.13 this am. - Nephrology following and we greatly appreciate their help. - Patient in need of AVF placement. - Will consult [facility] today.  # CAD - Will continue home Coreg, Plavix, Imdur, and statin  # DM-2 - Currently on Levemir 75 units BID and moderate SSI. - Hypoglycemic this am.  Levemir decreased to 65 units BID.  FEN/GI: Heart healthy  diet PPx: Lovenox (see above) Dispo: Pending clinical improvement Code Status: Full code  Thersa Salt, DO 10/13/2012, 11:35 AM

## 2012-10-13 NOTE — Progress Notes (Signed)
PCP Note:  I stopped to see Mr. Goedert this afternoon. The nephrology team was rounding on patient and discussing the status of his renal function. My visit therefore was very brief. I know Mr. Iadarola has made it clear to me in the past that he does not want HD, but hopefully he will reconsider.  I appreciate the [facility] excellent care of Mr. Schram since his admission. It seems that he failed to follow up with me as an outpatient a few weeks ago and I wonder if that has contributed to his acute decompensation. I will be very happy to see him after he is discharged from the hospital.  If you have any questions or concerns, please feel free to contact me at [phone].  Levii Hairfield M. Felise Georgia, M.D. 10/13/2012 12:19 PM

## 2012-10-13 NOTE — Progress Notes (Cosign Needed)
[facility] Progress Note    Subjective:   Feeling slightly better than at admission.  Still coughing, mild dyspnea.  No chest pain.   Objective:   BP 121/80  Pulse 82  Temp 98.9 F (37.2 C) (Oral)  Resp 20  Ht 6' (1.829 m)  Wt 295 lb (133.811 kg)  BMI 40.01 kg/m2  SpO2 95%  Intake/Output Summary (Last 24 hours) at 10/13/12 1100 Last data filed at 10/13/12 0908  Gross per 24 hour  Intake   1648 ml  Output   2500 ml  Net   -852 ml   Weight change: -13 lb (-5.897 kg)  Physical Exam:  Gen: obese pleasant AAM in no distress, breathing slightly labored Skin: no rash, cyanosis  HEENT: EOMI, sclera anicteric, throat clear  Chest: bilat basilar crackles coarse, and coarse bilat prolonged exp wheezing  CV: regular, no rub or gallop, no carotid bruit Abdomen: soft, obese, liver down 5 cm, ? ascites  Ext: 2 + LE edema bilat, +sacral edema 2+, no joint effusion or deformity, no gangrene or ulceration,  pedal pulses intact  Neuro: alert, Ox3, no focal deficit, no asterixis  Imaging: Dg Chest 2 View  10/12/2012  *RADIOLOGY REPORT*  Clinical Data: Follow up of CHF.  Shortness of breath.  CHEST - 2 VIEW  Comparison: 1 day prior  Findings: Midline trachea.  Normal heart size and mediastinal contours. No pleural effusion or pneumothorax.  Improved interstitial prominence. Clear lungs.  IMPRESSION: Improved to resolved pulmonary venous congestion.   Original Report Authenticated By: [name], M.D.    Nm Pulmonary Perf And Vent  10/12/2012  *RADIOLOGY REPORT*  Clinical Data:  Shortness of breath and pleuritic chest pain.  End- stage renal disease.  NUCLEAR MEDICINE VENTILATION - PERFUSION LUNG SCAN  Technique:  Ventilation images were obtained in multiple projections using inhaled aerosol technetium 99 M DTPA.  Perfusion images were obtained in multiple projections after intravenous injection of Tc-33m MAA.  Radiopharmaceuticals:  93mCi Tc-68m DTPA aerosol and 6.0 mCi Tc-65m  MAA.  Comparison: Plain film of 10/12/2012  Findings:  Ventilation:  No focal defect.  Heterogeneous uptake throughout.  Perfusion:   No wedge shaped peripheral perfusion defects to suggest acute pulmonary embolism  IMPRESSION: No evidence of pulmonary embolism.   Original Report Authenticated By: [name], M.D.    Dg Chest Portable 1 View  10/11/2012  *RADIOLOGY REPORT*  Clinical Data: Shortness of breath  PORTABLE CHEST - 1 VIEW  Comparison: 08/12/2012 and 11/08/2010  Findings: Very low lung volumes with mildly prominent cardiopericardial silhouette.  There is central pulmonary vascular congestion.  Diffuse interstitial prominence is seen throughout both lungs.  Interstitial prominence appears worse compared to the chest radiograph of 08/01/2012.  No focal airspace opacity.  No visible pleural effusion in the frontal projection.  Negative for pneumothorax.  Lung apices partially obscured by soft tissues of the patient's neck / face.  IMPRESSION: Cardiomegaly with pulmonary vascular congestion and interstitial opacities, more prominent compared to prior chest radiographs. Findings are most consistent with mild congestive heart failure pattern.  Viral infection is felt to be less likely.   Original Report Authenticated By: Curlene Dolphin, M.D.     Labs: BMET  Lab 10/13/12 0610 10/12/12 0444 10/11/12 1256  NA 140 136 136  K 3.8 4.0 4.0  CL 104 103 102  CO2 24 21 20   GLUCOSE 55* 165* 251*  BUN 49* 39* 36*  CREATININE 7.13* 6.16* 5.71*  ALB -- -- --  CALCIUM 8.5 8.7 8.7  PHOS 5.3* 3.9 --   CBC  Lab 10/11/12 1256  WBC 8.6  NEUTROABS 6.5  HGB 9.5*  HCT 30.0*  MCV 86.2  PLT 220  Iron 65 Ferritin 295  Medications:      . atorvastatin  80 mg Oral q1800  . calcitRIOL  0.25 mcg Oral Daily  . carvedilol  6.25 mg Oral BID WC  . clopidogrel  75 mg Oral Daily  . enoxaparin (LOVENOX) injection  130 mg Subcutaneous Q24H  . furosemide  160 mg Intravenous Q6H  . hydrALAZINE  10 mg Oral Q8H    . insulin aspart  0-15 Units Subcutaneous TID WC  . insulin detemir  65 Units Subcutaneous BID  . potassium chloride  20 mEq Oral BID  . sodium chloride  3 mL Intravenous Q12H     Assessment/ Plan:    1. CKD stage V - Cr and BUN worsened overnight.  Having improving UOP.  not grossly uremic, having problems with fluid overload primarily but symptomatically improved.  Is agreeable to AVF placement now (has already been eval by Dr Donnetta Hutching in Aug 2013) We will consult [facility]VS today. On max diuretics with significantly improving UOP,  Will decrease Lasix to q12 hours given improving respiratory status.  Watch for continued diuresis and expected improvement in Cr.  Will also consider HD if any signs of uremia.    2. Anemia - Hb 9.5, improved with diuresis. Iron & Ferritin WNL; no %sat calculated 3. Shortness of breath - now resolved. 4. LE DVT - now on Lovenox, no evidence of PE on VQ Scan.   5. Pulm edema / CHF / volume excess- on Coreg only, +vol excess w mild pulm edema, diuresing 6. DM 2 7. CAD w hx MI/stents 8. Hx CVA  Gerda Diss, DO [facility] Resident - PGY-2 10/13/2012 11:07 AM

## 2012-10-13 NOTE — Progress Notes (Signed)
VASCULAR LAB  Dr [name] note from 05-06-12:  Upper extremity vein mapping her office was ordered and interpreted by myself. This does show 3 mm in greater size diameter of cephalic vein in the left arm. Slightly smaller in the right with the 2 mm larger veins. I imaged his veins with the SonoSite and this does appear to have adequate size for cephalic vein fistula on the left:  Impression and plan: Chronic renal insufficiency for discussion of AV access. I discussed hemodialysis catheters, AV fistulas, and AV grafts with the patient. I explained the indications for each of these. I do feel that he has an acceptable size cephalic vein on the left attempted fistula. He is right-handed. I recommend that we proceed with Cimino radiocephalic AV fistula on the left. He understands that the longer this works a better likelihood of long-term use of it. He is unwilling to commit to surgery at this time and will notify should he wish to proceed with left arm fistula creation he  understands this is an outpatient procedure   Please notify vascular lab [phone]) if you need vein mapping redone.  Nolie Bignell , RVT 10/13/2012 2:55 PM

## 2012-10-13 NOTE — Progress Notes (Signed)
I examined this patient and discussed the care plan with Dr Lacinda Axon and the [facility] team and agree with assessment and plan as documented in the progress note above. I suggested Hydralazine added to nitrate in hopes of increasing renal perfusion.

## 2012-10-13 NOTE — Consult Note (Signed)
[facility] Consult  Reason for Consult:  ESRD Referring Physician:  Nephrology [id number]  History of Present Illness: This is a 57 y.o. male with PMH of CKD, CAD with hx of stent placement, Hx MI, OSA, diabetes, CHF, anxiety and depression.  He also has hx of stroke with right sided weakness, COPD.  He presents this admission with worsening dyspnea and orthopnea acute CHF exacerbation possibly due to his worsening renal disease.  He has been by Dr. Donnetta Hutching in the past for permanent access placement.  At that time, Dr. Donnetta Hutching recommended proceeding with left radio cephalic AVF.  We are consulted today for permanent access placement.  Past Medical History  Diagnosis Date  . Diabetes mellitus   . Hyperlipidemia   . Hypertension   . Myocardial infarction     status post MI x2 and 3 stents placed in 2003  . Stroke ~ 2007; ~1987    "weak on right side; messed w/right side of brain; cry all the time"  . Sleep apnea     "sleep w/CPAP sometimes"  . Arthritis   . Anxiety   . Depression   . Chronic systolic heart failure [id number]    ECHO Feb 2013 showed LVEF low normal at 50-55%, +hypokinetic anterolateral wall and inferolateral wall.    . CKD (chronic kidney disease) stage 4, GFR 15-29 ml/min 08/30/2009    Progressive renal failure since 2008, creatinine 1.2 in 2008 up to 3.5 in 2012 and 3.2-5.0 in 2013. All UA's 2011-13 showed >300 protein on dipstick. Work-up in May 2011 showed negative Urine IFE and SPEP, ultrasound showed 12-13 cm kidneys with increased echogenicity and UPC ratio was 1.5 gm proteinuria.  Hgb A1C's from 2011 to 2013 were all between 9-11.  Patient saw Dr. Donnetta Hutching (vasc surgery) for HD access in Aug 2013 > vein mapping was done and Dr. Donnetta Hutching felt the left arm (pt is R handed) was suitable for L arm Cimino radiocephalic fistula. Patient said he wasn't ready to consider doing dialysis and declined the surgery.      Past Surgical History  Procedure Date  . Coronary  stent placement   . Hernia repair     umbilical  . Coronary angioplasty with stent placement ~ 2002    "3"    No Known Allergies  Prior to Admission medications   Medication Sig Start Date End Date Taking? Authorizing Provider  albuterol (PROVENTIL HFA;VENTOLIN HFA) 108 (90 BASE) MCG/ACT inhaler Inhale 2 puffs into the lungs every 4 (four) hours as needed. For shortness of breath and wheezing 05/23/12  Yes Amber Fidel Levy, MD  calcitRIOL (ROCALTROL) 0.25 MCG capsule Take 0.25-0.5 mcg by mouth daily. Alternates takes 1 tablet and 2 capsules the next day   Yes Historical Provider, MD  carvedilol (COREG) 25 MG tablet Take 37.5 mg by mouth 2 (two) times daily with a meal.   Yes Historical Provider, MD  clopidogrel (PLAVIX) 75 MG tablet Take 1 tablet (75 mg total) by mouth daily. 08/25/12  Yes Amber Fidel Levy, MD  docusate sodium 100 MG CAPS Take 100 mg by mouth 2 (two) times daily as needed for constipation. 08/13/12  Yes Hilton Sinclair, MD  furosemide (LASIX) 80 MG tablet Take 160 mg by mouth 2 (two) times daily.   Yes Historical Provider, MD  insulin detemir (LEVEMIR) 100 UNIT/ML injection Inject 75 Units into the skin 2 (two) times daily. Please give pen if covered. 3 month supply. 02/21/12 02/20/13 Yes Lyndal Pulley, DO  isosorbide mononitrate (IMDUR) 60 MG 24 hr tablet Take 60 mg by mouth daily. 08/15/12  Yes Amber Fidel Levy, MD  Magnesium Hydroxide (MILK OF MAGNESIA PO) Take 15 mLs by mouth daily as needed. For constipation   Yes Historical Provider, MD  rosuvastatin (CRESTOR) 40 MG tablet Take 40 mg by mouth at bedtime.   Yes Historical Provider, MD  Elastic Bandages & Supports (MEDICAL COMPRESSION STOCKINGS) [place] 2 each by Does not apply route daily. 08/15/12   Amber Fidel Levy, MD  Insulin Syringe-Needle U-100 25G X 1" 1 ML MISC 1 applicator by Does not apply route 2 (two) times daily. Please give needles 4 Levemir. 02/21/12   Lyndal Pulley, DO    History   Social History  .  Marital Status: Divorced    Spouse Name: N/A    Number of Children: N/A  . Years of Education: N/A   Occupational History  . Not on file.   Social History Main Topics  . Smoking status: Former Smoker -- 1.0 packs/day for 15 years    Types: Cigarettes    Quit date: 09/24/1986  . Smokeless tobacco: Never Used  . Alcohol Use: 1.2 oz/week    2 Shots of liquor per week  . Drug Use: No  . Sexually Active: Not Currently   Other Topics Concern  . Not on file   Social History Narrative  . No narrative on file    Family History  Problem Relation Age of Onset  . Asthma Mother   . Hyperlipidemia Mother   . Hypertension Mother   . Stroke Father   . Heart attack Father   . Prostate cancer Father   . Deep vein thrombosis Father   . Cancer Father   . Diabetes Father   . Hyperlipidemia Father   . Hypertension Father   . Other Father     varicose veins  . Heart disease Father     before age 73  . Other Sister     varicose veins    ROS: [x]  Positive   [ ]  Negative   [ ]  All sytems reviewed and are negative  General: [ ]  Weight loss, [ ]  Weight gain, [ ]   Loss of appetite, [ ]  Fever Neurologic: [ ]  Dizziness, [ ]  Blackouts, [ ]  Headaches, [ ]  Seizure, [x]  Stroke, [ ]  "Mini stroke", [ ]  Slurred speech, [ ]  Temporary blindness Ear/Nose/Throat: [ ]  Change in eyesight, [ ]  Change in hearing, [ ]  Nose bleeds, [ ]  Sore throat Vascular: [ ]  Pain in legs with walking, [ ]  Pain in feet while lying flat, [ ]  Non-healing ulcer,  [ ]  Blood clot in vein, [ ]  Phlebitis Pulmonary: [ ]  Home oxygen, [ ]  Productive cough, [ ]  Bronchitis, [ ]  Coughing up blood, [x]  OSA [ ]  Asthma, [ ]  Wheezing Musculoskeletal: [ ]  Arthritis, [ ]  Joint pain, [ ]  Muscle pain Cardiac: [ ]  Chest pain, [ ]  Chest tightness/pressure, [ x] Shortness of breath when lying flat, [x ] Shortness of breath with exertion, [ ]  Palpitations, [ ]  Heart murmur, [ ]  Arrythmia, [x]  hx MI and sent placement; [x]  CAD; [x]  LE edema; [x]  Hx  CHF [ ]  Atrial fibrillation Hematologic: [ ]  Bleeding problems, [ ]  Clotting disorder, [ ]  Anemia Psychiatric:  [x ] Depression, [x ] Anxiety Gastrointestinal:  [ ]  Black stool,[ ]   Blood in stool, [ ]  Peptic ulcer disease, [ ]  Reflux, [ ]  Hiatal hernia, [ ]  Trouble swallowing, [ ]   Diarrhea, [ ]  Constipation Urinary:  [x ] Kidney disease, [ ]  Burning with urination, [ ]  nocturia, [ ]  Difficulty urinating Endocrine: [x ] hx diabetes, [ ]  hx thyroid disease Skin: [ ]  Ulcers, [ ]  Rashes   Physical Examination  Filed Vitals:   10/13/12 1415  BP: 146/82  Pulse: 80  Temp: 98.6 F (37 C)  Resp: 20   Body mass index is 40.01 kg/(m^2).  General:  WDWN in NAD Gait: Normal HENT: WNL Eyes: Pupils equal Pulmonary: normal non-labored breathing , without Rales, rhonchi,  wheezing Cardiac: RRR, without  Murmurs, rubs or gallops; No carotid bruits Abdomen: soft, NT, no masses Skin: no rashes, ulcers noted Vascular Exam/Pulse+2+ radial pulses bilat .mod cephalic vein size Extremities: without ischemic changes, no Gangrene , no cellulitis; no open wounds;  Musculoskeletal: no muscle wasting or atrophy  Neurologic: A&O X 3; Appropriate Affect ; SENSATION: normal; MOTOR FUNCTION:  moving all extremities equally. Speech is fluent/normal   CBC    Component Value Date/Time   WBC 8.6 10/11/2012 1256   RBC 3.48* 10/11/2012 1256   HGB 9.5* 10/11/2012 1256   HCT 30.0* 10/11/2012 1256   PLT 220 10/11/2012 1256   MCV 86.2 10/11/2012 1256   MCH 27.3 10/11/2012 1256   MCHC 31.7 10/11/2012 1256   RDW 14.1 10/11/2012 1256   LYMPHSABS 1.2 10/11/2012 1256   MONOABS 0.9 10/11/2012 1256   EOSABS 0.1 10/11/2012 1256   BASOSABS 0.0 10/11/2012 1256    BMET    Component Value Date/Time   NA 140 10/13/2012 0610   K 3.8 10/13/2012 0610   CL 104 10/13/2012 0610   CO2 24 10/13/2012 0610   GLUCOSE 55* 10/13/2012 0610   BUN 49* 10/13/2012 0610   CREATININE 7.13* 10/13/2012 0610   CREATININE 5.00* 08/25/2012 1414    CALCIUM 8.5 10/13/2012 0610   GFRNONAA 8* 10/13/2012 0610   GFRAA 9* 10/13/2012 0610     Non-Invasive Vascular Imaging:  05/06/12 (interpreted by Dr. Donnetta Hutching)  Upper extremity vein mapping her office was ordered and interpreted by myself. This does show 3 mm in greater size diameter of cephalic vein in the left arm. Slightly smaller in the right with the 2 mm larger veins. I imaged his veins with the SonoSite and this does appear to have adequate size for cephalic vein fistula on the left:  Impression and plan: Chronic renal insufficiency for discussion of AV access. I discussed hemodialysis catheters, AV fistulas, and AV grafts with the patient. I explained the indications for each of these. I do feel that he has an acceptable size cephalic vein on the left attempted fistula. He is right-handed. I recommend that we proceed with Cimino radiocephalic AV fistula on the left. He understands that the longer this works a better likelihood of long-term use of it. He is unwilling to commit to surgery at this time and will notify should he wish to proceed with left arm fistula creation he  understands this is an outpatient procedure    ASSESSMENT/PLAN: This is a 57 y.o. male with ESRD in need of permanent HD access  -plan for left radio cephalic AVF per Dr. Donnetta Hutching -pt found to have right DVT noted in the gastrocnemius and on Lovenox for this.  This will need to be discontinued the evening before surgery.   Leontine Locket, PA-C [facility] [phone]  I have examined the patient, reviewed and agree with above. Recommend exploration of left cephalic vein at the wrist for probable  left arm AV fistula. We proceed with left arm AV graft if cephalic vein in adequate. Can get this on the operative schedule later in the week. Edmon Magid, MD 10/13/2012 3:44 PM

## 2012-10-13 NOTE — Progress Notes (Signed)
  Echocardiogram 2D Echocardiogram has been performed.  Jonathon Bailey Jonathon Bailey 10/13/2012, 6:11 PM

## 2012-10-14 LAB — URINE CULTURE

## 2012-10-14 LAB — RENAL FUNCTION PANEL
Albumin: 2 g/dL — ABNORMAL LOW (ref 3.5–5.2)
Calcium: 8.4 mg/dL (ref 8.4–10.5)
Chloride: 105 mEq/L (ref 96–112)
Creatinine, Ser: 6.95 mg/dL — ABNORMAL HIGH (ref 0.50–1.35)
GFR calc non Af Amer: 8 mL/min — ABNORMAL LOW (ref >90)
Phosphorus: 5.6 mg/dL — ABNORMAL HIGH (ref 2.3–4.6)

## 2012-10-14 LAB — GLUCOSE, CAPILLARY
Glucose-Capillary: 151 mg/dL — ABNORMAL HIGH (ref 70–99)
Glucose-Capillary: 127 mg/dL — ABNORMAL HIGH (ref 70–99)

## 2012-10-14 LAB — FERRITIN: Ferritin: 359 ng/mL — ABNORMAL HIGH (ref 22–322)

## 2012-10-14 LAB — IRON AND TIBC: TIBC: 195 ug/dL — ABNORMAL LOW (ref 215–435)

## 2012-10-14 LAB — CBC
MCH: 27.9 pg (ref 26.0–34.0)
MCHC: 32.4 g/dL (ref 30.0–36.0)
Platelets: 229 10*3/uL (ref 150–400)
RBC: 3.15 MIL/uL — ABNORMAL LOW (ref 4.22–5.81)
RDW: 14.1 % (ref 11.5–15.5)

## 2012-10-14 MED ORDER — ALBUTEROL SULFATE (5 MG/ML) 0.5% IN NEBU
5.0000 mg | INHALATION_SOLUTION | Freq: Four times a day (QID) | RESPIRATORY_TRACT | Status: DC
  Administered 2012-10-14 – 2012-10-16 (×9): 5 mg via RESPIRATORY_TRACT
  Filled 2012-10-14 (×6): qty 0.5
  Filled 2012-10-14 (×2): qty 1
  Filled 2012-10-14 (×6): qty 0.5

## 2012-10-14 MED ORDER — IPRATROPIUM BROMIDE 0.02 % IN SOLN
0.5000 mg | Freq: Four times a day (QID) | RESPIRATORY_TRACT | Status: DC
  Administered 2012-10-14 – 2012-10-16 (×9): 0.5 mg via RESPIRATORY_TRACT
  Filled 2012-10-14 (×9): qty 2.5

## 2012-10-14 MED ORDER — SORBITOL 70 % SOLN
30.0000 mL | Freq: Three times a day (TID) | Status: AC
  Administered 2012-10-14: 30 mL via ORAL
  Filled 2012-10-14 (×6): qty 30

## 2012-10-14 MED ORDER — HYDRALAZINE HCL 25 MG PO TABS
25.0000 mg | ORAL_TABLET | Freq: Three times a day (TID) | ORAL | Status: DC
  Administered 2012-10-14 – 2012-10-15 (×3): 25 mg via ORAL
  Filled 2012-10-14 (×6): qty 1

## 2012-10-14 MED ORDER — SORBITOL 70 % SOLN
30.0000 mL | Freq: Every day | Status: DC | PRN
  Administered 2012-10-15 – 2012-10-18 (×2): 30 mL via ORAL
  Filled 2012-10-14 (×2): qty 30

## 2012-10-14 NOTE — Progress Notes (Signed)
Inpatient Diabetes Program Recommendations  AACE/ADA: New Consensus Statement on Inpatient Glycemic Control (2013)  Target Ranges:  Prepandial:   less than 140 mg/dL      Peak postprandial:   less than 180 mg/dL (1-2 hours)      Critically ill patients:  140 - 180 mg/dL   Results for RAHIM, CACCESE (MRN [id number]) as of 10/14/2012 10:19  Ref. Range 10/13/2012 06:14 10/13/2012 07:00 10/13/2012 08:58 10/13/2012 11:06 10/13/2012 16:28 10/13/2012 17:18 10/13/2012 21:05 10/14/2012 06:24  Glucose-Capillary Latest Range: 70-99 mg/dL 69 (L) 67 (L) 111 (H) 139 (H) 68 (L) 72 129 (H) 95    Inpatient Diabetes Program Recommendations Correction (SSI): Please consider decreasing Novolog correction to sensitive correction scale.  Note: Patient has a history of diabetes and takes Levemir 75 units BID at home for diabetes management.  Currently ordered Levemir 65 units BID and Novolog moderate correction AC for inpatient glycemic control.  Patient experienced a hypoglycemic event yesterday morning around 6:14 am and yesterday before supper at 16:28.  Noted that Levemir was decreased from 75 BID to 65 units BID on 10/13/12 at 7:19 am.  Patient received Levemir 65 units on 10/13/12 at 9:05am and again at 21:30.  Patient has received Levemir 65 units this morning at 9:27am.  Please consider decreasing Novolog correction to sensitive correction scale.  If Novolog correction scale is decreased and patient still experiences hypoglycemia, may need to decrease Levemir even further.  Will continue to follow.  Thanks, Barnie Alderman, RN, BSN, [place] [facility] Coordinator Inpatient Diabetes Program [phone]

## 2012-10-14 NOTE — Progress Notes (Signed)
Family Medicine Teaching Service Daily Progress Note Service Page: [phone]  Subjective:  Diuresed well overnight. Was seen by Vascular surgery yesterday.  Plan to have AVF placement later this week.  Seen and examined this am.  Patient reports that he is feeling slightly better.  No reported SOB.  Objective: Temp:  [98.2 F (36.8 C)-99.7 F (37.6 C)] 98.2 F (36.8 C) (01/21 0551) Pulse Rate:  [80-89] 88  (01/21 0551) Resp:  [20] 20  (01/21 0551) BP: (121-146)/(61-84) 138/76 mmHg (01/21 0551) SpO2:  [95 %-99 %] 98 % (01/21 0551) Weight:  [294 lb 8.6 oz (133.6 kg)] 294 lb 8.6 oz (133.6 kg) (01/21 0551)  Intake/Output Summary (Last 24 hours) at 10/14/12 0653 Last data filed at 10/14/12 0549  Gross per 24 hour  Intake   1209 ml  Output   3725 ml  Net  -2516 ml   Physical Exam: General: sitting up in chair this am, talking on the phone. Cardiovascular: RRR. No murmurs, rubs, or gallops. Respiratory: Normal work of breathing.  Coarse bibasilar crackles, prolonged expiratory phase, and wheezing appreciated.  Abdomen: obese, soft, nontender. Extremities: 2+ pitting edema.  CBC BMET   Lab 10/14/12 0510 10/11/12 1256  WBC 6.5 8.6  HGB 8.8* 9.5*  HCT 27.2* 30.0*  PLT 229 220    Lab 10/14/12 0500 10/13/12 0610 10/12/12 0444  NA 141 140 136  K 4.0 3.8 4.0  CL 105 104 103  CO2 22 24 21   BUN 50* 49* 39*  CREATININE 6.95* 7.13* 6.16*  GLUCOSE 103* 55* 165*  CALCIUM 8.4 8.5 8.7     Imaging/Diagnostic Tests:  LE Doppler: DVT noted in the gastrocnemius  Dg Chest 2 View 10/12/2012 IMPRESSION: Improved to resolved pulmonary venous congestion.    Nm Pulmonary Perf And Vent 10/12/2012  IMPRESSION: No evidence of pulmonary embolism.   Dg Chest Portable 1 View 10/11/2012 IMPRESSION: Cardiomegaly with pulmonary vascular congestion and interstitial opacities, more prominent compared to prior chest radiographs. Findings are most consistent with mild congestive heart failure  pattern.  Viral infection is felt to be less likely.  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   # Dyspnea - Likely secondary to Acute diastolic CHF exacerbation - Chest xray and elevated proBNP UP:[id number]) consistent with CHF exacerbation.  VQ scan obtained to assess for PE.  Scan was negative. - Patient has diuresed well (-2500 mL over the past 24 hours); now net negative 2782 for admission. - Will continue diureses with IV Lasix 160 mg Q12 hours as patient is still fluid overloaded. - Will schedule Duonebs Q6 today given wheezing and prolonged expiratory phase.  # Lower extremity DVT - LE dopplers showed RLE DVT. - Patient on therapeutic Lovenox 130 mg daily (pharmacy monitoring)  # Fever- 102.6 @ 2200 on 1/18.  May be secondary to LE DVT. - Blood cultures and urine cultures obtained.  Blood cultures returned negative yesterday and Vanc and Zosyn were discontinued.  - Will continue to monitor closely.  Patient continues to be afebrile.  # CKD, stage 5. - Worsening renal function/creatinine.  Creatinine 5 prior to admission.  - BMP revealed improving creatinine this am (7.13 --> 6.95) - Nephrology following and we greatly appreciate their help. - Patient will have AVF placement later this week.  # CAD - Will continue home Coreg, Plavix,  Imdur, and statin - Hydralazine added yesterday (1/20) to aid in afterload reduction. - Increasing Hydralazine to 25 mg TID today.  # DM-2 - Will continue Levemir 65 units BID and Moderate SSI - CBG's well controlled currently   FEN/GI: Heart healthy diet PPx: Lovenox (see above) Dispo: Pending clinical improvement Code Status: Full code  Thersa Salt, DO 10/14/2012, 6:53 AM

## 2012-10-14 NOTE — Progress Notes (Signed)
[name]    Subjective:   Improved overnight.  Reports having home CPAP but not using here.  Reports some resp congestion.  Conversational Dyspnea remains but reports ~50% improvement in resp symptoms.  Agreable to AV fistula placement   Objective:   BP 138/76  Pulse 88  Temp 98.2 F (36.8 C) (Oral)  Resp 20  Ht 6' (1.829 m)  Wt 294 lb 8.6 oz (133.6 kg)  BMI 39.95 kg/m2  SpO2 98%  Intake/Output Summary (Last 24 hours) at 10/14/12 0641 Last data filed at 10/14/12 0549  Gross per 24 hour  Intake   1209 ml  Output   3725 ml  Net  -2516 ml   Weight change: -7.5 oz (-0.211 kg)  Physical Exam:  Gen: obese pleasant AAM in no distress, breathing slightly labored Skin: no rash, cyanosis  HEENT: EOMI, sclera anicteric, throat clear  Chest: bilat basilar crackles coarse, and coarse bilat prolonged exp wheezing  CV: regular, no rub or gallop, no carotid bruit Abdomen: soft, obese, liver down 5 cm, ? ascites  Ext: 2+ LE edema bilat, no joint effusion or deformity, no gangrene or ulceration,  pedal pulses intact Neuro: alert, Ox3, no focal deficit, no asterixis  Imaging: Dg Chest 2 View  10/13/2012  *RADIOLOGY REPORT*  Clinical Data: Dyspnea.  CHEST - 2 VIEW  Comparison: 10/12/2012 and multiple radiographs dated back to 01/23/2004 and a CT scan of the chest dated 01/23/2004  Findings: There has been further improvement in the interstitial accentuation.  The patient does have prominent peribronchial thickening bilaterally consistent with bronchitis.  No effusions. No consolidation.  There is abnormal soft tissue density lateral to the arch of the descending thoracic aorta which is new since the radiographs of 11/07/2011.  This may represent a large pulmonary artery and appears to extend more superior than I would expect.  I recommend CT scan of the chest with contrast for further evaluation.  IMPRESSION:  1.  Further improvement in the interstitial  markings.  Residual bronchitic changes. 2.  Fullness of the superior aspect the left hilum and aortopulmonary window.  I recommend CT scan of the chest for further evaluation.   Original Report Authenticated By: [name], M.D.    Nm Pulmonary Perf And Vent  10/12/2012  *RADIOLOGY REPORT*  Clinical Data:  Shortness of breath and pleuritic chest pain.  End- stage renal disease.  NUCLEAR MEDICINE VENTILATION - PERFUSION LUNG SCAN  Technique:  Ventilation images were obtained in multiple projections using inhaled aerosol technetium 99 M DTPA.  Perfusion images were obtained in multiple projections after intravenous injection of Tc-57m MAA.  Radiopharmaceuticals:  19mCi Tc-2m DTPA aerosol and 6.0 mCi Tc-26m MAA.  Comparison: Plain film of 10/12/2012  Findings:  Ventilation:  No focal defect.  Heterogeneous uptake throughout.  Perfusion:   No wedge shaped peripheral perfusion defects to suggest acute pulmonary embolism  IMPRESSION: No evidence of pulmonary embolism.   Original Report Authenticated By: [name], M.D.     Labs: RENAL LABS  Lab 10/14/12 0500 10/13/12 0610 10/12/12 0444 10/11/12 1256  NA 141 140 136 136  K 4.0 3.8 4.0 4.0  CL 105 104 103 102  CO2 22 24 21 20   BUN 50* 49* 39* 36*  CREATININE 6.95* 7.13* 6.16* 5.71*  CALCIUM 8.4 8.5 8.7 8.7  GLUCOSE 103* 55* 165* 251*  MG -- -- -- --  PHOS 5.6* 5.3* 3.9 --  PROT -- -- -- --  ALBUMIN 2.0* 1.9* 2.2* --  Lab 10/14/12 0510 10/11/12 1256  WBC 6.5 8.6  HGB 8.8* 9.5*  HCT 27.2* 30.0*  PLT 229 220  RDW 14.1 14.1  MCV 86.3 86.2  MCHC 32.4 31.7   No results found for this basename: ALT:5,AST:5,ALKPHOS:5,BILITOT:5,BILIDIR:5 in the last 168 hours No results found for this basename: AMMONIA:3,URICACID:3 in the last 168 hours No results found for this basename: PTH:3 in the last 168 hours  Urine Studies  Basename 10/12/12 1153  COLORURINE YELLOW  APPEARANCEUR CLOUDY*  LABSPEC 1.018  PHURINE 6.0  GLUCOSEU 250*  HGBUR  LARGE*  BILIRUBINUR NEGATIVE  KETONESUR NEGATIVE  PROTEINUR >300*  UROBILINOGEN 1.0  NITRITE NEGATIVE  LEUKOCYTESUR NEGATIVE   Iron 65 Ferritin 295  Medications:       . atorvastatin  80 mg Oral q1800  . calcitRIOL  0.25 mcg Oral Daily  . carvedilol  6.25 mg Oral BID WC  . clopidogrel  75 mg Oral Daily  . enoxaparin (LOVENOX) injection  130 mg Subcutaneous Q24H  . furosemide  160 mg Intravenous Q12H  . hydrALAZINE  10 mg Oral Q8H  . insulin aspart  0-15 Units Subcutaneous TID WC  . insulin detemir  65 Units Subcutaneous BID  . potassium chloride  20 mEq Oral BID  . sodium chloride  3 mL Intravenous Q12H     Assessment/ Plan:    # CKD stage V - (secondary to HTN and DM nephropathy) Cr trending down, continues to have improving UOP with increasing diuresis.  not grossly uremic, fluid overload improving.  Watch for continued diuresis and expected improvement in Cr.  Would consider HD if any signs of uremia but not acutely antcipated    # Vascular Access: Appreciate Dr. [name] reassement of this patient.  Plan to proceed with placement of permanent AV fistula in left forearm later this week  # Anemia - Hb 9.5, improved with diuresis. Iron & Ferritin WNL; no %sat calculated  # Shortness of breath - now resolved.  # LE DVT - now on Lovenox, no evidence of PE on VQ Scan.  Will need to be discontinued the night prior to AV fistula placement  # Pulm edema / CHF / volume excess- on Coreg only, +vol excess w mild pulm edema, diuresing; ECHO pending  # DM 2 # CAD w hx MI/stents # Hx CVA  Jonathon Diss, DO [facility] Bailey Family [facility] Resident - PGY-2 10/14/2012 6:41 AM  I have seen and examined this patient and agree with plan per Dr Paulla Fore.  No Uremic Sxs.  Diuresing well.  Cont IV lasix.  To get access later this week.  He is requesting a laxative. Jonathon Bailey,Jonathon Bailey 10/14/2012 10:50 AM

## 2012-10-14 NOTE — Care Management Note (Signed)
    Page 1 of 1   10/14/2012     11:13:34 AM   CARE MANAGEMENT NOTE 10/14/2012  Patient:  Jonathon Bailey, Jonathon Bailey   Account Number:  [id number]  Date Initiated:  10/14/2012  Documentation initiated by:  Llana Aliment  Subjective/Objective Assessment:   57yo male admitted with HF.  Pt. lives with significant other and son in [place].     Action/Plan:   In to complete HF Screen.  Pt. states he does not want any HH services at this time.  He states his significant other assists him   Anticipated DC Date:  10/16/2012   Anticipated DC Plan:  [place]  CM consult      Choice offered to / List presented to:             Status of service:  In process, will continue to follow Medicare Important Message given?   (If response is "NO", the following Medicare IM given date fields will be blank) Date Medicare IM given:   Date Additional Medicare IM given:    Discharge Disposition:    Per UR Regulation:  Reviewed for med. necessity/level of care/duration of stay  If discussed at [place] of Stay Meetings, dates discussed:    Comments:  10/14/12 1100 Pt. did not want any HH services at this time. NCM to follow for any further discharge needs. Llana Aliment, RN, BSN NCM [phone]

## 2012-10-14 NOTE — Progress Notes (Signed)
I examined this patient and discussed the care plan with Dr Lacinda Axon and the [facility] team and agree with assessment and plan as documented in the progress note above. I added a TSH to his next labs due to his complaint of several months of feeling cold with dry skin.

## 2012-10-14 NOTE — Progress Notes (Signed)
Patient is refusing CPAP at this time.

## 2012-10-15 LAB — RENAL FUNCTION PANEL
Albumin: 2.2 g/dL — ABNORMAL LOW (ref 3.5–5.2)
Chloride: 107 mEq/L (ref 96–112)
Creatinine, Ser: 6.85 mg/dL — ABNORMAL HIGH (ref 0.50–1.35)
GFR calc non Af Amer: 8 mL/min — ABNORMAL LOW (ref >90)
Potassium: 4.3 mEq/L (ref 3.5–5.1)

## 2012-10-15 LAB — GLUCOSE, CAPILLARY
Glucose-Capillary: 139 mg/dL — ABNORMAL HIGH (ref 70–99)
Glucose-Capillary: 137 mg/dL — ABNORMAL HIGH (ref 70–99)

## 2012-10-15 MED ORDER — DIPHENHYDRAMINE HCL 50 MG/ML IJ SOLN
25.0000 mg | Freq: Once | INTRAMUSCULAR | Status: AC
  Administered 2012-10-15: 25 mg via INTRAVENOUS

## 2012-10-15 MED ORDER — FUROSEMIDE 10 MG/ML IJ SOLN
160.0000 mg | Freq: Three times a day (TID) | INTRAVENOUS | Status: DC
  Administered 2012-10-15: 160 mg via INTRAVENOUS
  Filled 2012-10-15 (×3): qty 16

## 2012-10-15 MED ORDER — FUROSEMIDE 10 MG/ML IJ SOLN
160.0000 mg | Freq: Three times a day (TID) | INTRAVENOUS | Status: DC
  Administered 2012-10-15 – 2012-10-16 (×4): 160 mg via INTRAVENOUS
  Filled 2012-10-15 (×8): qty 16

## 2012-10-15 MED ORDER — HYDRALAZINE HCL 50 MG PO TABS
50.0000 mg | ORAL_TABLET | Freq: Three times a day (TID) | ORAL | Status: DC
  Administered 2012-10-15 – 2012-10-18 (×10): 50 mg via ORAL
  Filled 2012-10-15 (×12): qty 1

## 2012-10-15 MED ORDER — SODIUM CHLORIDE 0.9 % IV SOLN
1020.0000 mg | Freq: Once | INTRAVENOUS | Status: AC
  Administered 2012-10-15: 1020 mg via INTRAVENOUS
  Filled 2012-10-15 (×2): qty 34

## 2012-10-15 MED ORDER — TORSEMIDE 20 MG PO TABS
80.0000 mg | ORAL_TABLET | Freq: Once | ORAL | Status: DC
  Filled 2012-10-15: qty 4

## 2012-10-15 MED ORDER — TORSEMIDE 20 MG PO TABS: 80.0000 mg | ORAL_TABLET | Freq: Two times a day (BID) | ORAL | Status: DC

## 2012-10-15 MED ORDER — DIPHENHYDRAMINE HCL 50 MG/ML IJ SOLN
INTRAMUSCULAR | Status: AC
  Administered 2012-10-15: 25 mg via INTRAVENOUS
  Filled 2012-10-15: qty 1

## 2012-10-15 MED ORDER — CARVEDILOL 12.5 MG PO TABS
12.5000 mg | ORAL_TABLET | Freq: Two times a day (BID) | ORAL | Status: DC
  Administered 2012-10-15 – 2012-10-18 (×7): 12.5 mg via ORAL
  Filled 2012-10-15 (×8): qty 1

## 2012-10-15 NOTE — Progress Notes (Signed)
Jonathon Bailey Progress Note    Subjective:   Improved overnight.  Reports having home CPAP but declined using here.  Uncomfortable with the mask.  Reports some resp congestion.  Conversational Dyspnea remains but overall unchanged.   Agreable to AV fistula placement later this week   Objective:   BP 156/83  Pulse 87  Temp 98.3 F (36.8 C) (Oral)  Resp 21  Ht 6' (1.829 m)  Wt 294 lb 8.6 oz (133.6 kg)  BMI 39.95 kg/m2  SpO2 95%  Intake/Output Summary (Last 24 hours) at 10/15/12 0551 Last data filed at 10/15/12 0100  Gross per 24 hour  Intake    580 ml  Output   1550 ml  Net   -970 ml   Weight change:   Physical Exam:  Gen: obese pleasant AAM in no distress, breathing slightly labored Skin: no rash, cyanosis  HEENT: EOMI, sclera anicteric, throat clear  Chest: bilat basilar crackles coarse, and coarse bilat prolonged exp wheezing  CV: regular, no rub or gallop, no carotid bruit Abdomen: soft, obese, liver down 5 cm, ? ascites  Ext: 2+ LE edema bilat, no joint effusion or deformity, no gangrene or ulceration,  pedal pulses intact Neuro: alert, Ox3, no focal deficit, no asterixis  Imaging: Dg Chest 2 View  10/13/2012  *RADIOLOGY REPORT*  Clinical Data: Dyspnea.  CHEST - 2 VIEW  Comparison: 10/12/2012 and multiple radiographs dated back to 01/23/2004 and a CT scan of the chest dated 01/23/2004  Findings: There has been further improvement in the interstitial accentuation.  The patient does have prominent peribronchial thickening bilaterally consistent with bronchitis.  No effusions. No consolidation.  There is abnormal soft tissue density lateral to the arch of the descending thoracic aorta which is new since the radiographs of 11/07/2011.  This may represent a large pulmonary artery and appears to extend more superior than I would expect.  I recommend CT scan of the chest with contrast for further evaluation.  IMPRESSION:  1.  Further improvement in the interstitial  markings.  Residual bronchitic changes. 2.  Fullness of the superior aspect the left hilum and aortopulmonary window.  I recommend CT scan of the chest for further evaluation.   Original Report Authenticated By: [name], M.D.    10/13/2012 - 2D ECHO: EF [id number], Grade 2 Diastolic dysfunction, PA pressure not evaluated; Exaggerated mitral flow consistent with COPD Exacerbation   Labs: RENAL LABS  Lab 10/14/12 0500 10/13/12 0610 10/12/12 0444 10/11/12 1256  NA 141 140 136 136  K 4.0 3.8 4.0 4.0  CL 105 104 103 102  CO2 22 24 21 20   BUN 50* 49* 39* 36*  CREATININE 6.95* 7.13* 6.16* 5.71*  CALCIUM 8.4 8.5 8.7 8.7  GLUCOSE 103* 55* 165* 251*  MG -- -- -- --  PHOS 5.6* 5.3* 3.9 --  PROT -- -- -- --  ALBUMIN 2.0* 1.9* 2.2* --    Lab 10/14/12 0510 10/11/12 1256  WBC 6.5 8.6  HGB 8.8* 9.5*  HCT 27.2* 30.0*  PLT 229 220  RDW 14.1 14.1  MCV 86.3 86.2  MCHC 32.4 31.7   No results found for this basename: ALT:5,AST:5,ALKPHOS:5,BILITOT:5,BILIDIR:5 in the last 168 hours No results found for this basename: AMMONIA:3,URICACID:3 in the last 168 hours No results found for this basename: PTH:3 in the last 168 hours  Urine Studies  Basename 10/12/12 1153  COLORURINE YELLOW  APPEARANCEUR CLOUDY*  LABSPEC 1.018  PHURINE 6.0  GLUCOSEU 250*  HGBUR LARGE*  BILIRUBINUR NEGATIVE  KETONESUR NEGATIVE  PROTEINUR >300*  UROBILINOGEN 1.0  NITRITE NEGATIVE  LEUKOCYTESUR NEGATIVE   Results for HENSON, SWINEFORD (MRN [id number]) as of 10/15/2012 08:49  Ref. Range 10/14/2012 12:23  Iron Latest Range: 42-135 ug/dL 34 (L)  UIBC Latest Range: 125-400 ug/dL 161  TIBC Latest Range: 215-435 ug/dL 195 (L)  Saturation Ratios Latest Range: 20-55 % 17 (L)  Ferritin Latest Range: 22-322 ng/mL 359 (H)    Medications:       . ipratropium  0.5 mg Nebulization Q6H   And  . albuterol  5 mg Nebulization Q6H  . atorvastatin  80 mg Oral q1800  . calcitRIOL  0.25 mcg Oral Daily  . carvedilol  6.25 mg  Oral BID WC  . clopidogrel  75 mg Oral Daily  . enoxaparin (LOVENOX) injection  130 mg Subcutaneous Q24H  . furosemide  160 mg Intravenous Q12H  . hydrALAZINE  25 mg Oral Q8H  . insulin aspart  0-15 Units Subcutaneous TID WC  . insulin detemir  65 Units Subcutaneous BID  . potassium chloride  20 mEq Oral BID  . sodium chloride  3 mL Intravenous Q12H  . sorbitol  30 mL Oral TID     Assessment/ Plan:    # CKD stage V - (secondary to HTN and DM nephropathy) Cr trending down, continues to have improving UOP with increasing diuresis.  not grossly uremic, fluid overload improving.  Watch for continued diuresis and expected improvement in Cr.  Would consider HD if any signs of uremia but not acutely anticipated; will increase lasix doses to tid given stable Cr function and elevated BP  # Volume Overload:  Still overtly overloaded, continues with good UOP and kidney function stable, continue aggressive diuresis  # HTN: Continues to go up, will increase diuresis, otherwise per primary    # Vascular Access: Appreciate Dr. [name] reassement of this patient.  Plan to proceed with placement of permanent AV fistula in left forearm later this week  # Anemia - Hb 9.5, improved with diuresis. Iron and %sat low; IV feraheme today per FAIR-HF and Iron-HF trials  # Shortness of breath - improving but wheezing, consider COPD exacerbation per wheezing and findings on ECHO  # LE DVT - now on Lovenox, no evidence of PE on VQ Scan.  Will need to be discontinued the night prior to AV fistula placement  # Pulm edema / CHF / volume excess- on Coreg only, +vol excess w mild pulm edema, diuresing; ECHO pending  # Medical Non-compliance:  We have discussed the imminent reality of HD in near future.  He is currently is (and apparently has been long term) in denial about his long term prognosis of his kidney disease as well as cardiovascular risk factors including DM, HTN, CAD and untreated/undertreated OSA.   Discussed aggressive treatment with him.  Will have nutrition discuss CHO Modified Renal Diet   # CHF:  Followed by [facility] as OP.   #OSA -  Refused CPAP overnight; likely non compliant at home; consider repeat Sleep Study vs refer to Pulm for eval for nasal CPAP due to mask discomfort # DM 2 # CAD w hx MI/stents # Hx CVA # Constipation: given Sorbitol; d/c Milk of Magnesia as OP given potential mag toxicity  Gerda Diss, DO [place] Resident - PGY-2 10/15/2012 5:51 AM I have seen and examined this patient and agree with plan per Dr Paulla Fore.  To get IV IRON.  Cont with diuresis.  For  access tomorrow.  No uremic sxs. Nafeesah Lapaglia T,MD 10/15/2012 10:50 AM

## 2012-10-15 NOTE — Plan of Care (Addendum)
Problem: Food- and Nutrition-Related Knowledge Deficit (NB-1.1) Goal: Nutrition education Formal process to instruct or train a patient/client in a skill or to impart knowledge to help patients/clients voluntarily manage or modify food choices and eating behavior to maintain or improve health. Outcome: Completed/Met Date Met:  10/15/12 Nutrition Education Note  RD consulted for diet education: Renal and CHO mod.  Talked with pt about renal diet. Not on HD, no interest in starting. Instructed on limiting sodium, phosphorus, and potassium. Pt interested in how to leach potatoes, otherwise fairly non-communicative. Provided pt with handout about renal diet and foods to avoid. Based on pt's past hx, expect very poor compliance.  Current Diet: Renal. Meal completion: >/=75% most meals.  Body mass index is 39.90 kg/(m^2).  Obesity class 2.   No further intervention warranted. Please consult RD if further nutrition intervention is needed.   Jarome Matin Dietetic Intern # [id number]     Orson Slick RD, LDN Pager [phone] After Hours pager [phone]

## 2012-10-15 NOTE — Progress Notes (Signed)
Family Medicine Teaching Service Daily Progress Note Service Page: [phone]  Subjective:  Continued to diurese well overnight. Feeling well this am.  Does report some SOB this am. Approached subject of dialysis this am.  Patient still not amenable to dialysis currently.  States that he feels like God is going to fix his kidneys.  Objective: Temp:  [97.9 F (36.6 C)-98.3 F (36.8 C)] 98.2 F (36.8 C) (01/22 0652) Pulse Rate:  [83-89] 83  (01/22 0652) Resp:  [20-21] 20  (01/22 0652) BP: (125-161)/(83-96) 161/89 mmHg (01/22 0652) SpO2:  [95 %-98 %] 95 % (01/22 0702) Weight:  [294 lb 3.2 oz (133.448 kg)] 294 lb 3.2 oz (133.448 kg) (01/22 [id number])   Intake/Output Summary (Last 24 hours) at 10/15/12 0833 Last data filed at 10/15/12 [id number]  Gross per 24 hour  Intake    580 ml  Output   2250 ml  Net  -1670 ml   Physical Exam: General: sitting up in chair this am, talking on the phone. Cardiovascular: RRR. No murmurs, rubs, or gallops. Respiratory: Normal work of breathing.  Coarse bibasilar crackles, prolonged expiratory phase, and diffuse wheezing appreciated.  Abdomen: obese, soft, nontender. Extremities: 2+ pitting edema.  CBC BMET   Lab 10/14/12 0510 10/11/12 1256  WBC 6.5 8.6  HGB 8.8* 9.5*  HCT 27.2* 30.0*  PLT 229 220    Lab 10/15/12 0604 10/14/12 0500 10/13/12 0610  NA 141 141 140  K 4.3 4.0 3.8  CL 107 105 104  CO2 23 22 24   BUN 51* 50* 49*  CREATININE 6.85* 6.95* 7.13*  GLUCOSE 136* 103* 55*  CALCIUM 8.6 8.4 8.5     Imaging/Diagnostic Tests:  2D Echo: Study Conclusions  - Left ventricle: The cavity size was normal. There was moderate concentric hypertrophy. Systolic function was mildly to moderately reduced. The estimated ejection fraction was in the range of 40% to 45%. Probable moderate hypokinesis of the lateral myocardium. Features are consistent with a pseudonormal left ventricular filling pattern, with concomitant abnormal relaxation and increased  filling pressure (grade 2 diastolic dysfunction). Doppler parameters are consistent with elevated mean left atrial filling pressure. - Mitral valve: Calcified annulus. Mildly thickened leaflets - Left atrium: The atrium was moderately dilated. - Right ventricle: The cavity size was mildly dilated. Systolic function was mildly reduced. - Right atrium: The atrium was mildly dilated. - Pericardium, extracardiac: A trivial pericardial effusion was identified. Impressions:  - Exaggerated mitral flow variation is noted. Consider constrictive physiology versus increased work of breathing (e.g. COPD exacerbation).  LE Doppler: DVT noted in the gastrocnemius  Dg Chest 2 View 10/12/2012 IMPRESSION: Improved to resolved pulmonary venous congestion.    Nm Pulmonary Perf And Vent 10/12/2012  IMPRESSION: No evidence of pulmonary embolism.   Dg Chest Portable 1 View 10/11/2012 IMPRESSION: Cardiomegaly with pulmonary vascular congestion and interstitial opacities, more prominent compared to prior chest radiographs. Findings are most consistent with mild congestive heart failure pattern.  Viral infection is felt to be less likely.  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   # Dyspnea - Likely secondary to Acute combined systolic & diastolic CHF exacerbation and COPD exacerbation .  Chest xray and elevated proBNP (7437) consistent with CHF exacerbation.  Echo obtained and revealed decreased EF of 40-45% and Grade 2 diastolic dysfunction.  VQ scan obtained to  assess for PE.  Scan was negative.  - Patient has diuresed well (-1670 over the past 24 hours); now net negative 4452 for admission. - IV Lasix increased to 160 mg TID per renal recommendations. - Will continue Duonebs Q6 given wheezing and  prolonged expiratory phase.  # Lower extremity DVT - LE dopplers showed RLE DVT. - Patient on therapeutic Lovenox 130 mg daily (pharmacy monitoring)  # Fever- 102.6 @ 2200 on 1/18. Likely secondary to LE DVT. - Blood cultures and urine cultures obtained and were negative. - Patient continues to be afebrile   # CKD, stage 5. - Worsening renal function/creatinine.  Creatinine 5 prior to admission.  - BMP revealed improving creatinine this am (7.13 --> 6.95 --> 6.85) - Nephrology following and we greatly appreciate their help. - Patient will have AVF placement later this week.  # CAD - Will continue home Coreg, Plavix, Imdur/Hydralazine (Hydralazine increased to 50 mg TID), and statin  # DM-2 - Will continue Levemir 65 units BID and Moderate SSI - CBG's well controlled currently   FEN/GI: Heart healthy diet PPx: Lovenox (see above) Dispo: Pending clinical improvement Code Status: Full code  Thersa Salt, DO 10/15/2012, 8:33 AM

## 2012-10-15 NOTE — Progress Notes (Signed)
ANTICOAGULATION CONSULT NOTE - Follow Up Consult  Pharmacy Consult for Lovenox Indication: DVT  No Known Allergies  Patient Measurements: Height: 6' (182.9 cm) Weight: 294 lb 3.2 oz (133.448 kg) (a scale) IBW/kg (Calculated) : 77.6   Vital Signs: Temp: 98.2 F (36.8 C) (01/22 0652) Temp src: Oral (01/22 0652) BP: 161/89 mmHg (01/22 0652) Pulse Rate: 83  (01/22 0652)  Labs:  Basename 10/15/12 0604 10/14/12 0510 10/14/12 0500 10/13/12 0610  HGB -- 8.8* -- --  HCT -- 27.2* -- --  PLT -- 229 -- --  APTT -- -- -- --  LABPROT -- -- -- --  INR -- -- -- --  HEPARINUNFRC -- -- -- --  CREATININE 6.85* -- 6.95* 7.13*  CKTOTAL -- -- -- --  CKMB -- -- -- --  TROPONINI -- -- -- --    Estimated Creatinine Clearance: 17.2 ml/min (by C-G formula based on Cr of 6.85).  Assessment: Jonathon Bailey is a 57 year old male on lovenox for lower extremity DVT. Scr now trending down slightly s/p diuresis (lasix dc'd). Noted plans for AVF later this week. Hgb at 8.8 (9.5 on 1/8). Faraheme ordered. Platelets wnl. No bleeding noted.   Goal of Therapy:  Anti-Xa level 0.6-1.2 units/ml 4hrs after LMWH dose given Monitor platelets by anticoagulation protocol: Yes   Plan:  1). Continue lovenox 130mg  sq daily. Lovenox needs to be held at least 24 hours prior to AVF placement.  2). F/u plans for oral anticoagulation 3) f/u q3d cbc  Thank you,  Francesca Jewett, PharmD, BCPS 10/15/2012 9:16 AM

## 2012-10-15 NOTE — Plan of Care (Signed)
Problem: Phase I Progression Outcomes Goal: EF % per last Echo/documented,Core Reminder form on chart EF 40-45% per echo on 10/13/2012

## 2012-10-15 NOTE — Progress Notes (Signed)
I examined this patient and discussed the care plan with Dr Lacinda Axon and the [facility] team and agree with assessment and plan as documented in the progress note above.

## 2012-10-16 LAB — GLUCOSE, CAPILLARY
Glucose-Capillary: 131 mg/dL — ABNORMAL HIGH (ref 70–99)
Glucose-Capillary: 161 mg/dL — ABNORMAL HIGH (ref 70–99)
Glucose-Capillary: 182 mg/dL — ABNORMAL HIGH (ref 70–99)

## 2012-10-16 LAB — RENAL FUNCTION PANEL
BUN: 51 mg/dL — ABNORMAL HIGH (ref 6–23)
Calcium: 8.8 mg/dL (ref 8.4–10.5)
Creatinine, Ser: 6.74 mg/dL — ABNORMAL HIGH (ref 0.50–1.35)
GFR calc Af Amer: 10 mL/min — ABNORMAL LOW (ref >90)
Glucose, Bld: 134 mg/dL — ABNORMAL HIGH (ref 70–99)
Phosphorus: 4.6 mg/dL (ref 2.3–4.6)
Sodium: 139 mEq/L (ref 135–145)

## 2012-10-16 MED ORDER — DEXTROSE 5 % IV SOLN
1.5000 g | INTRAVENOUS | Status: AC
  Filled 2012-10-16: qty 1.5

## 2012-10-16 MED ORDER — ALBUTEROL SULFATE (5 MG/ML) 0.5% IN NEBU
5.0000 mg | INHALATION_SOLUTION | RESPIRATORY_TRACT | Status: DC | PRN
  Filled 2012-10-16: qty 0.5

## 2012-10-16 MED ORDER — LEVOFLOXACIN 500 MG PO TABS
500.0000 mg | ORAL_TABLET | ORAL | Status: DC
  Administered 2012-10-18: 500 mg via ORAL
  Filled 2012-10-16: qty 1

## 2012-10-16 MED ORDER — POTASSIUM CHLORIDE CRYS ER 20 MEQ PO TBCR
20.0000 meq | EXTENDED_RELEASE_TABLET | Freq: Every day | ORAL | Status: DC
  Administered 2012-10-17 – 2012-10-18 (×2): 20 meq via ORAL
  Filled 2012-10-16 (×2): qty 1

## 2012-10-16 MED ORDER — LEVOFLOXACIN 750 MG PO TABS
750.0000 mg | ORAL_TABLET | Freq: Once | ORAL | Status: AC
  Administered 2012-10-16: 750 mg via ORAL
  Filled 2012-10-16: qty 1

## 2012-10-16 MED ORDER — IPRATROPIUM BROMIDE 0.02 % IN SOLN: 0.5000 mg | Freq: Four times a day (QID) | RESPIRATORY_TRACT | Status: DC

## 2012-10-16 MED ORDER — IPRATROPIUM BROMIDE 0.02 % IN SOLN
0.5000 mg | RESPIRATORY_TRACT | Status: DC
  Administered 2012-10-16 – 2012-10-18 (×10): 0.5 mg via RESPIRATORY_TRACT
  Filled 2012-10-16 (×10): qty 2.5

## 2012-10-16 MED ORDER — ALBUTEROL SULFATE (5 MG/ML) 0.5% IN NEBU: 5.0000 mg | INHALATION_SOLUTION | RESPIRATORY_TRACT | Status: DC

## 2012-10-16 MED ORDER — ALBUTEROL SULFATE (5 MG/ML) 0.5% IN NEBU
5.0000 mg | INHALATION_SOLUTION | RESPIRATORY_TRACT | Status: DC
  Administered 2012-10-16 (×4): 5 mg via RESPIRATORY_TRACT
  Administered 2012-10-17: 2.5 mg via RESPIRATORY_TRACT
  Administered 2012-10-17: 5 mg via RESPIRATORY_TRACT
  Administered 2012-10-18: 2.5 mg via RESPIRATORY_TRACT
  Administered 2012-10-18 (×3): 5 mg via RESPIRATORY_TRACT
  Filled 2012-10-16 (×3): qty 0.5
  Filled 2012-10-16: qty 1
  Filled 2012-10-16 (×4): qty 0.5
  Filled 2012-10-16: qty 1
  Filled 2012-10-16: qty 0.5
  Filled 2012-10-16: qty 1
  Filled 2012-10-16 (×3): qty 0.5

## 2012-10-16 MED ORDER — PREDNISONE 50 MG PO TABS
50.0000 mg | ORAL_TABLET | Freq: Every day | ORAL | Status: DC
  Administered 2012-10-16 – 2012-10-18 (×3): 50 mg via ORAL
  Filled 2012-10-16 (×4): qty 1

## 2012-10-16 MED ORDER — FLUTICASONE PROPIONATE 50 MCG/ACT NA SUSP
2.0000 | Freq: Every day | NASAL | Status: DC
  Administered 2012-10-16 – 2012-10-18 (×3): 2 via NASAL
  Filled 2012-10-16: qty 16

## 2012-10-16 NOTE — Progress Notes (Signed)
I examined this patient and discussed the care plan with Dr Lacinda Axon and the [facility] team and agree with assessment and plan as documented in the progress note above.

## 2012-10-16 NOTE — Progress Notes (Signed)
Notified MD of patient complains of nasal congestion. MD will put in orders to help resolve congestion. Will continue to monitor as needed to end of shift.

## 2012-10-16 NOTE — Progress Notes (Signed)
[name] Medical record number: [id number] Date of birth: 24-Dec-1955 Age: 57 y.o. Gender: male  Primary Care Provider: Melrose Nakayama, MD Primary OP Nephrologist: [facility] Service: FMTS Service Requesting Consult: Same  Subjective:   Feeling some better, still conversational dyspnea; Access planned for tomorrow; plavix held yesterday and Lovenox held tonight; seen by nutrition yesterday   Objective:   Vitals: Temp:  [97.1 F (36.2 C)-98.3 F (36.8 C)] 97.1 F (36.2 C) (01/23 0616) Pulse Rate:  [85-93] 93  (01/23 0616) Resp:  [20] 20  (01/23 0616) BP: (151-157)/(85-105) 151/87 mmHg (01/23 0616) SpO2:  [96 %-99 %] 98 % (01/23 0743) FiO2 (%):  [21 %] 21 % (01/23 0743) Weight:  [294 lb 11.2 oz (133.675 kg)] 294 lb 11.2 oz (133.675 kg) (01/23 0616)  Weight: Wt Readings from Last 3 Encounters:  10/16/12 294 lb 11.2 oz (133.675 kg)  08/25/12 304 lb (137.893 kg)  08/15/12 308 lb (139.708 kg)   Weight change: 8 oz (0.227 kg)  I&Os: Yesterday: 01/22 0701 - 01/23 0700 In: 1112 [P.O.:980; IV Piggyback:132] Out: 2200 [Urine:2200] This shift:    Physical Exam:  Gen: obese pleasant AAM in no distress, breathing slightly labored, mild conversational dyspnea Skin: no rash, cyanosis  HEENT: EOMI, sclera anicteric, throat clear  Chest: bilat basilar crackles coarse, and coarse bilat prolonged exp wheezing  CV: regular, no rub or gallop, no carotid bruit Abdomen: soft, obese, liver down 5 cm, ? ascites  Ext: 2/4 LE edema bilat, no joint effusion or deformity, no gangrene or ulceration,  pedal pulses intact Neuro: alert, Ox3, no focal deficit, no asterixis  Imaging: No results found. 10/13/2012 - 2D ECHO: EF [id number], Grade 2 Diastolic dysfunction, PA pressure not evaluated; Exaggerated mitral flow consistent with COPD Exacerbation   Labs: RENAL LABS  Lab 10/15/12 0604 10/14/12 0500 10/13/12 0610  10/12/12 0444 10/11/12 1256  NA 141 141 140 136 136  K 4.3 4.0 3.8 4.0 4.0  CL 107 105 104 103 102  CO2 23 22 24 21 20   BUN 51* 50* 49* 39* 36*  CREATININE 6.85* 6.95* 7.13* 6.16* 5.71*  CALCIUM 8.6 8.4 8.5 8.7 8.7  GLUCOSE 136* 103* 55* 165* 251*  MG -- -- -- -- --  PHOS 5.0* 5.6* 5.3* -- --  PROT -- -- -- -- --  ALBUMIN 2.2* 2.0* 1.9* -- --    Lab 10/14/12 0510 10/11/12 1256  WBC 6.5 8.6  HGB 8.8* 9.5*  HCT 27.2* 30.0*  PLT 229 220  RDW 14.1 14.1  MCV 86.3 86.2  MCHC 32.4 31.7   No results found for this basename: ALT:5,AST:5,ALKPHOS:5,BILITOT:5,BILIDIR:5 in the last 168 hours No results found for this basename: AMMONIA:3,URICACID:3 in the last 168 hours No results found for this basename: PTH:3 in the last 168 hours  Urine Studies  Basename 10/12/12 1153  COLORURINE YELLOW  APPEARANCEUR CLOUDY*  LABSPEC 1.018  PHURINE 6.0  GLUCOSEU 250*  [place] >300*  UROBILINOGEN 1.0  NITRITE NEGATIVE  LEUKOCYTESUR NEGATIVE   Results for Jonathon Bailey, Jonathon Bailey (MRN [id number]) as of 10/15/2012 08:49  Ref. Range 10/14/2012 12:23  Iron Latest Range: 42-135 ug/dL 34 (L)  UIBC Latest Range: 125-400 ug/dL 161  TIBC Latest Range: 215-435 ug/dL 195 (L)  Saturation Ratios Latest Range: 20-55 % 17 (L)  Ferritin Latest Range: 22-322 ng/mL 359 (H)    Medications:       .  ipratropium  0.5 mg Nebulization Q6H   And  . albuterol  5 mg Nebulization Q6H  . atorvastatin  80 mg Oral q1800  . calcitRIOL  0.25 mcg Oral Daily  . carvedilol  12.5 mg Oral BID WC  . clopidogrel  75 mg Oral Daily  . enoxaparin (LOVENOX) injection  130 mg Subcutaneous Q24H  . furosemide (LASIX) = OR > 100 MG IVPB  160 mg Intravenous TID  . hydrALAZINE  50 mg Oral Q8H  . insulin aspart  0-15 Units Subcutaneous TID WC  . insulin detemir  65 Units Subcutaneous BID  . potassium chloride  20 mEq Oral BID  . sodium chloride  3 mL Intravenous Q12H  .  sorbitol  30 mL Oral TID     Assessment/ Plan:    # CKD stage V - (secondary to HTN and DM nephropathy) Cr trending down, continues to have improving UOP with increasing diuresis.  not grossly uremic, fluid overload improving.  Watch for continued diuresis and expected improvement in Cr.  Would consider HD if any signs of uremia but not acutely anticipated; will increase lasix doses to tid given stable Cr function and elevated BP;  # Volume Overload:  Still overtly overloaded, continues with good UOP and kidney function stable, continue aggressive diuresis  # HTN: Continues to go up, will increase diuresis, otherwise per primary    # Vascular Access: Appreciate Dr. [name] reassement of this patient.  Plan to proceed with placement of permanent AV fistula in left forearm later this week  # Anemia - Hb 9.5, improved with diuresis. Iron and %sat low; IV feraheme today per FAIR-HF and Iron-HF trials  # Shortness of breath - improving but wheezing, consider COPD exacerbation per wheezing and findings on ECHO  # LE DVT - now on Lovenox, no evidence of PE on VQ Scan.  Will need to be discontinued the night prior to AV fistula placement  # Pulm edema / CHF / volume excess- on Coreg only, +vol excess w mild pulm edema, diuresing; ECHO pending  # Medical Non-compliance:  We have discussed the imminent reality of HD in near future.  He is currently is (and apparently has been long term) in denial about his long term prognosis of his kidney disease as well as cardiovascular risk factors including DM, HTN, CAD and untreated/undertreated OSA.  Discussed aggressive treatment with him.  Will have nutrition discuss CHO Modified Renal Diet   # CHF:  Followed by CARDS as OP.   #OSA -  Refused CPAP overnight; likely non compliant at home; consider repeat Sleep Study vs refer to Pulm for eval for nasal CPAP due to mask discomfort # DM 2 # CAD w hx MI/stents # Hx CVA # Constipation: given Sorbitol; d/c Milk  of Magnesia as OP given potential mag toxicity  Gerda Diss, DO [place] Resident - PGY-2 10/16/2012 6:13 AM I have seen and examined this patient and agree with plan per Dr Paulla Fore.  No uremic Sxs.  Renal fx stable.  For HD access in AM. Cont with diuresis.  Had pruritis to feraheme so will add it to his allergy list. Zierra Laroque T,MD 10/16/2012 10:00 AM

## 2012-10-16 NOTE — Progress Notes (Signed)
Patient ID: Jonathon Bailey, male   DOB: 03-15-1956, 57 y.o.   MRN: [id number] For OR in am. Plan LUE AVF if feasable---if not will insert AVG>

## 2012-10-16 NOTE — Progress Notes (Signed)
Peripheral IV not intact, had drainage when flushing IV at time of administration of IVPB lasix. Attempted insertion of IV into right arm but unsuccessful. Called IV team. IV team inserted IV right after change of shift but at the time of insertion of new peripheral IV administration of lasix IVPB had become too close to next dose per pharmacy and therefore has become missed dose due to drainage from peripheral IV. Notified on-call MD, Dr. Verdie Drown of missed dose of Lasix and was advised to just continue with next dose with no new orders given. Currently patient is up in chair and has a new peripheral IV that is patent. Notified oncoming nurse of this matter. Nurse signing off.

## 2012-10-16 NOTE — Progress Notes (Signed)
Family Medicine Teaching Service Daily Progress Note Service Page: [phone]  Subjective:  Reports SOB is the same.  Also reports wheezing and chest congestion. Conversational and in good spirits this am.  Objective: Temp:  [97.1 F (36.2 C)-98.3 F (36.8 C)] 97.1 F (36.2 C) (01/23 0616) Pulse Rate:  [85-93] 93  (01/23 0616) Resp:  [20] 20  (01/23 0616) BP: (151-157)/(85-105) 151/87 mmHg (01/23 0616) SpO2:  [96 %-99 %] 98 % (01/23 0743) FiO2 (%):  [21 %] 21 % (01/23 0743) Weight:  [294 lb 11.2 oz (133.675 kg)] 294 lb 11.2 oz (133.675 kg) (01/23 0616)   Intake/Output Summary (Last 24 hours) at 10/16/12 0903 Last data filed at 10/16/12 0616  Gross per 24 hour  Intake    872 ml  Output   1800 ml  Net   -928 ml   Physical Exam: General: sitting up in chair this am, talking on the phone. Cardiovascular: RRR. No murmurs, rubs, or gallops. Respiratory: Normal work of breathing. Diffuse wheezing throughout. Abdomen: obese, soft, nontender. Extremities: 2+ pretibial pitting edema bilaterally.  CBC BMET   Lab 10/14/12 0510 10/11/12 1256  WBC 6.5 8.6  HGB 8.8* 9.5*  HCT 27.2* 30.0*  PLT 229 220    Lab 10/16/12 0553 10/15/12 0604 10/14/12 0500  NA 139 141 141  K 4.0 4.3 4.0  CL 105 107 105  CO2 20 23 22   BUN 51* 51* 50*  CREATININE 6.74* 6.85* 6.95*  GLUCOSE 134* 136* 103*  CALCIUM 8.8 8.6 8.4     Imaging/Diagnostic Tests:  2D Echo: Study Conclusions  - Left ventricle: The cavity size was normal. There was moderate concentric hypertrophy. Systolic function was mildly to moderately reduced. The estimated ejection fraction was in the range of 40% to 45%. Probable moderate hypokinesis of the lateral myocardium. Features are consistent with a pseudonormal left ventricular filling pattern, with concomitant abnormal relaxation and increased filling pressure (grade 2 diastolic dysfunction). Doppler parameters are consistent with elevated mean left atrial filling  pressure. - Mitral valve: Calcified annulus. Mildly thickened leaflets - Left atrium: The atrium was moderately dilated. - Right ventricle: The cavity size was mildly dilated. Systolic function was mildly reduced. - Right atrium: The atrium was mildly dilated. - Pericardium, extracardiac: A trivial pericardial effusion was identified. Impressions:  - Exaggerated mitral flow variation is noted. Consider constrictive physiology versus increased work of breathing (e.g. COPD exacerbation).  LE Doppler: DVT noted in the gastrocnemius  Dg Chest 2 View 10/12/2012 IMPRESSION: Improved to resolved pulmonary venous congestion.    Nm Pulmonary Perf And Vent 10/12/2012  IMPRESSION: No evidence of pulmonary embolism.   Dg Chest Portable 1 View 10/11/2012 IMPRESSION: Cardiomegaly with pulmonary vascular congestion and interstitial opacities, more prominent compared to prior chest radiographs. Findings are most consistent with mild congestive heart failure pattern.  Viral infection is felt to be less likely.  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   # Dyspnea - Likely secondary to Acute combined systolic & diastolic CHF exacerbation and COPD exacerbation .  Chest xray and elevated proBNP (7437) consistent with CHF exacerbation.  Echo obtained and revealed decreased EF of 40-45% and Grade 2 diastolic dysfunction.  VQ scan obtained to assess for PE.  Scan was negative.   CHF exacerbation  - Continuing Diuresis with IV Lasix 160 mg TID.  Renal following and we greatly appreciate their input.  COPD Exacerbation - Duonebs increased to Q4 hours, Q2PRN Albuterol - Starting Levaquin today for COPD Exacerbation  # Lower extremity DVT - LE dopplers showed RLE DVT. - Patient on therapeutic Lovenox  130 mg daily (pharmacy monitoring) - Will need to hold Lovenox this afternoon per Vascular recommendations as patient is going for surgery tomorrow.  # Fever- 102.6 @ 2200 on 1/18. Likely secondary to LE DVT. - Blood cultures and urine cultures obtained and were negative. - Patient continues to be afebrile   # CKD, stage 5 - Worsening renal function/creatinine.  Creatinine 5 prior to admission.  - Nephrology following and we greatly appreciate their help. - Creatinine continues to improve with diuresis - Patient to go for AV fistula tomorrow (10/17/12)  # CAD - Will continue home Coreg, Imdur/Hydralazine (Hydralazine increased to 50 mg TID), and statin - Holding Plavix today given surgery tomorrow  # DM-2 - Will continue Levemir 65 units BID and Moderate SSI  FEN/GI: Renal diet PPx: Lovenox (see above) Dispo: Pending clinical improvement Code Status: Full code  Thersa Salt, DO 10/16/2012, 9:03 AM

## 2012-10-17 ENCOUNTER — Telehealth: Payer: Self-pay | Admitting: Vascular Surgery

## 2012-10-17 ENCOUNTER — Encounter (HOSPITAL_COMMUNITY)
Admission: EM | Disposition: A | Discharge status: Home / Self Care | Payer: Self-pay | Source: Home / Self Care | Attending: Family Medicine

## 2012-10-17 ENCOUNTER — Encounter (HOSPITAL_COMMUNITY): Payer: Self-pay | Admitting: Anesthesiology

## 2012-10-17 ENCOUNTER — Inpatient Hospital Stay (HOSPITAL_COMMUNITY): Payer: Medicare Other | Admitting: Anesthesiology

## 2012-10-17 ENCOUNTER — Other Ambulatory Visit: Payer: Self-pay | Admitting: *Deleted

## 2012-10-17 DIAGNOSIS — N186 End stage renal disease: Secondary | ICD-10-CM

## 2012-10-17 DIAGNOSIS — Z4931 Encounter for adequacy testing for hemodialysis: Secondary | ICD-10-CM

## 2012-10-17 DIAGNOSIS — I82409 Acute embolism and thrombosis of unspecified deep veins of unspecified lower extremity: Secondary | ICD-10-CM

## 2012-10-17 HISTORY — PX: AV FISTULA PLACEMENT: SHX1204

## 2012-10-17 LAB — GLUCOSE, CAPILLARY
Glucose-Capillary: 207 mg/dL — ABNORMAL HIGH (ref 70–99)
Glucose-Capillary: 183 mg/dL — ABNORMAL HIGH (ref 70–99)
Glucose-Capillary: 308 mg/dL — ABNORMAL HIGH (ref 70–99)

## 2012-10-17 LAB — CBC
MCH: 27 pg (ref 26.0–34.0)
MCV: 85.2 fL (ref 78.0–100.0)
Platelets: 315 10*3/uL (ref 150–400)
RBC: 3.45 MIL/uL — ABNORMAL LOW (ref 4.22–5.81)

## 2012-10-17 LAB — RENAL FUNCTION PANEL
CO2: 20 mEq/L (ref 19–32)
Calcium: 9.2 mg/dL (ref 8.4–10.5)
Creatinine, Ser: 6.76 mg/dL — ABNORMAL HIGH (ref 0.50–1.35)
GFR calc non Af Amer: 8 mL/min — ABNORMAL LOW (ref >90)

## 2012-10-17 SURGERY — ARTERIOVENOUS (AV) FISTULA CREATION
Anesthesia: Monitor Anesthesia Care | Site: Arm Lower | Laterality: Left | Wound class: Clean

## 2012-10-17 MED ORDER — SODIUM CHLORIDE 0.9 % IR SOLN
Status: DC | PRN
  Administered 2012-10-17: 11:00:00

## 2012-10-17 MED ORDER — ENOXAPARIN SODIUM 150 MG/ML ~~LOC~~ SOLN
130.0000 mg | SUBCUTANEOUS | Status: DC
  Administered 2012-10-17: 130 mg via SUBCUTANEOUS
  Filled 2012-10-17 (×2): qty 1

## 2012-10-17 MED ORDER — TORSEMIDE 20 MG PO TABS
80.0000 mg | ORAL_TABLET | Freq: Three times a day (TID) | ORAL | Status: DC
  Administered 2012-10-17 (×2): 80 mg via ORAL
  Filled 2012-10-17 (×5): qty 4

## 2012-10-17 MED ORDER — CEFUROXIME SODIUM 1.5 G IJ SOLR
INTRAMUSCULAR | Status: AC
  Administered 2012-10-17: 1.5 g via INTRAMUSCULAR
  Filled 2012-10-17: qty 1.5

## 2012-10-17 MED ORDER — INSULIN ASPART 100 UNIT/ML ~~LOC~~ SOLN
0.0000 [IU] | Freq: Three times a day (TID) | SUBCUTANEOUS | Status: DC
  Administered 2012-10-17: 11 [IU] via SUBCUTANEOUS
  Administered 2012-10-18: 5 [IU] via SUBCUTANEOUS
  Administered 2012-10-18: 3 [IU] via SUBCUTANEOUS

## 2012-10-17 MED ORDER — INSULIN ASPART 100 UNIT/ML ~~LOC~~ SOLN: 0.0000 [IU] | Freq: Three times a day (TID) | SUBCUTANEOUS | Status: DC

## 2012-10-17 MED ORDER — LIDOCAINE-EPINEPHRINE (PF) 1 %-1:200000 IJ SOLN
INTRAMUSCULAR | Status: DC | PRN
  Administered 2012-10-17: 30 mL

## 2012-10-17 MED ORDER — FENTANYL CITRATE 0.05 MG/ML IJ SOLN
INTRAMUSCULAR | Status: DC | PRN
  Administered 2012-10-17: 25 ug via INTRAVENOUS
  Administered 2012-10-17 (×5): 50 ug via INTRAVENOUS
  Administered 2012-10-17: 25 ug via INTRAVENOUS
  Administered 2012-10-17 (×2): 50 ug via INTRAVENOUS
  Administered 2012-10-17: 25 ug via INTRAVENOUS
  Administered 2012-10-17: 50 ug via INTRAVENOUS
  Administered 2012-10-17: 25 ug via INTRAVENOUS

## 2012-10-17 MED ORDER — 0.9 % SODIUM CHLORIDE (POUR BTL) OPTIME
TOPICAL | Status: DC | PRN
  Administered 2012-10-17: 1000 mL

## 2012-10-17 MED ORDER — DEXTROSE 5 % IV SOLN
INTRAVENOUS | Status: AC
  Filled 2012-10-17: qty 50

## 2012-10-17 MED ORDER — LIDOCAINE HCL (PF) 1 % IJ SOLN
INTRAMUSCULAR | Status: AC
  Filled 2012-10-17: qty 30

## 2012-10-17 MED ORDER — ISOSORBIDE MONONITRATE ER 60 MG PO TB24
60.0000 mg | ORAL_TABLET | Freq: Every day | ORAL | Status: DC
  Administered 2012-10-17 – 2012-10-18 (×2): 60 mg via ORAL
  Filled 2012-10-17 (×2): qty 1

## 2012-10-17 MED ORDER — MIDAZOLAM HCL 5 MG/5ML IJ SOLN
INTRAMUSCULAR | Status: DC | PRN
  Administered 2012-10-17 (×2): 1 mg via INTRAVENOUS

## 2012-10-17 MED ORDER — OXYCODONE HCL 5 MG PO TABS
5.0000 mg | ORAL_TABLET | Freq: Four times a day (QID) | ORAL | Status: DC | PRN
  Administered 2012-10-17 – 2012-10-18 (×3): 5 mg via ORAL
  Filled 2012-10-17 (×3): qty 1

## 2012-10-17 MED ORDER — INSULIN DETEMIR 100 UNIT/ML ~~LOC~~ SOLN
70.0000 [IU] | Freq: Two times a day (BID) | SUBCUTANEOUS | Status: DC
  Administered 2012-10-17 (×2): 70 [IU] via SUBCUTANEOUS

## 2012-10-17 MED ORDER — HYDROMORPHONE HCL PF 1 MG/ML IJ SOLN: 0.2500 mg | INTRAMUSCULAR | Status: DC | PRN

## 2012-10-17 MED ORDER — LIDOCAINE-EPINEPHRINE (PF) 1 %-1:200000 IJ SOLN
INTRAMUSCULAR | Status: AC
  Filled 2012-10-17: qty 10

## 2012-10-17 SURGICAL SUPPLY — 39 items
ADH SKN CLS APL DERMABOND .7 (GAUZE/BANDAGES/DRESSINGS) ×1
CANISTER SUCTION 2500CC (MISCELLANEOUS) ×2 IMPLANT
CLIP TI MEDIUM 6 (CLIP) ×2 IMPLANT
CLIP TI WIDE RED SMALL 6 (CLIP) ×2 IMPLANT
CLOTH BEACON ORANGE TIMEOUT ST (SAFETY) ×2 IMPLANT
COVER PROBE W GEL 5X96 (DRAPES) IMPLANT
COVER SURGICAL LIGHT HANDLE (MISCELLANEOUS) ×2 IMPLANT
DERMABOND ADVANCED (GAUZE/BANDAGES/DRESSINGS) ×1
DERMABOND ADVANCED .7 DNX12 (GAUZE/BANDAGES/DRESSINGS) ×1 IMPLANT
DRAIN PENROSE 3/4X12 (DRAIN) ×1 IMPLANT
ELECT REM PT RETURN 9FT ADLT (ELECTROSURGICAL) ×2
ELECTRODE REM PT RTRN 9FT ADLT (ELECTROSURGICAL) ×1 IMPLANT
GEL ULTRASOUND 20GR AQUASONIC (MISCELLANEOUS) IMPLANT
GLOVE BIO SURGEON STRL SZ 6.5 (GLOVE) ×2 IMPLANT
GLOVE BIO SURGEON STRL SZ7 (GLOVE) ×1 IMPLANT
GLOVE BIO SURGEON STRL SZ8 (GLOVE) ×1 IMPLANT
GLOVE BIOGEL PI IND STRL 6.5 (GLOVE) IMPLANT
GLOVE BIOGEL PI IND STRL 7.0 (GLOVE) IMPLANT
GLOVE BIOGEL PI INDICATOR 6.5 (GLOVE) ×1
GLOVE BIOGEL PI INDICATOR 7.0 (GLOVE) ×1
GLOVE SS BIOGEL STRL SZ 6.5 (GLOVE) IMPLANT
GLOVE SS BIOGEL STRL SZ 7 (GLOVE) ×1 IMPLANT
GLOVE SUPERSENSE BIOGEL SZ 6.5 (GLOVE) ×2
GLOVE SUPERSENSE BIOGEL SZ 7 (GLOVE) ×1
GOWN STRL NON-REIN LRG LVL3 (GOWN DISPOSABLE) ×4 IMPLANT
GOWN STRL REIN XL XLG (GOWN DISPOSABLE) ×2 IMPLANT
KIT BASIN OR (CUSTOM PROCEDURE TRAY) ×2 IMPLANT
KIT ROOM TURNOVER OR (KITS) ×2 IMPLANT
NS IRRIG 1000ML POUR BTL (IV SOLUTION) ×2 IMPLANT
PACK CV ACCESS (CUSTOM PROCEDURE TRAY) ×2 IMPLANT
PAD ARMBOARD 7.5X6 YLW CONV (MISCELLANEOUS) ×4 IMPLANT
SPONGE GAUZE 4X4 12PLY (GAUZE/BANDAGES/DRESSINGS) ×2 IMPLANT
SUT PROLENE 6 0 BV (SUTURE) ×2 IMPLANT
SUT VIC AB 3-0 SH 27 (SUTURE) ×2
SUT VIC AB 3-0 SH 27X BRD (SUTURE) ×1 IMPLANT
TOWEL OR 17X24 6PK STRL BLUE (TOWEL DISPOSABLE) ×2 IMPLANT
TOWEL OR 17X26 10 PK STRL BLUE (TOWEL DISPOSABLE) ×2 IMPLANT
UNDERPAD 30X30 INCONTINENT (UNDERPADS AND DIAPERS) ×2 IMPLANT
WATER STERILE IRR 1000ML POUR (IV SOLUTION) ×2 IMPLANT

## 2012-10-17 NOTE — Progress Notes (Signed)
Resp Care Note;Pt refusing CPAP. 

## 2012-10-17 NOTE — Progress Notes (Signed)
*  PRELIMINARY RESULTS* Vascular Ultrasound Lower extremity venous duplex has been completed.  Preliminary findings: Right= no evidence of DVT.  Landry Mellow, RDMS, RVT  10/17/2012, 4:27 PM

## 2012-10-17 NOTE — Progress Notes (Signed)
[facility] Progress Note  Patient name: Jonathon Bailey Medical record number: [id number] Date of birth: 01/20/1956 Age: 57 y.o. Gender: male  Primary Care Provider: Melrose Nakayama, MD Primary OP Nephrologist: [facility] Service: FMTS Service Requesting Consult: Same  Subjective:   Feeling mildly improved; surgery today Wants to go home Noncompliant with CPAP overnight   Objective:   Vitals: Temp:  [97 F (36.1 C)-98.2 F (36.8 C)] 98 F (36.7 C) (01/24 0228) Pulse Rate:  [90-98] 92  (01/24 0228) Resp:  [21-22] 22  (01/24 0228) BP: (152-167)/(74-97) 158/74 mmHg (01/24 0228) SpO2:  [95 %-98 %] 95 % (01/24 0228) FiO2 (%):  [21 %] 21 % (01/23 1635) Weight:  [294 lb 8.6 oz (133.6 kg)] 294 lb 8.6 oz (133.6 kg) (01/24 0228)  Weight: Wt Readings from Last 3 Encounters:  10/17/12 294 lb 8.6 oz (133.6 kg)  10/17/12 294 lb 8.6 oz (133.6 kg)  08/25/12 304 lb (137.893 kg)   Weight change: -2.7 oz (-0.075 kg)  I&Os: Yesterday: 01/23 0701 - 01/24 0700 In: 580 [P.O.:580] Out: 2100 [Urine:2100] This shift:    Physical Exam:  Gen: obese AAM in no distress, breathing slightly labored, laying flat, mild conversational dyspnea but improved from 1/23 Skin: no rash, cyanosis  HEENT: EOMI, sclera anicteric, throat clear  Chest:  Few bilat basilar crackles coarse, and coarse bilat prolonged exp wheezing  CV: regular, no rub or gallop, Abdomen: soft, obese, Ext: 1+ LE edema bilat, no joint effusion or deformity, no gangrene or ulceration,  pedal pulses intact  + bruit Lt AVF Neuro: alert, Ox3, no focal deficit, no asterixis  Imaging: No results found. 10/13/2012 - 2D ECHO: EF A999333, Grade 2 Diastolic dysfunction, PA pressure not evaluated; Exaggerated mitral flow consistent with COPD Exacerbation   Labs: RENAL LABS  Lab 10/17/12 0500 10/16/12 0553 10/15/12 0604 10/14/12 0500 10/13/12 0610 10/12/12 0444 10/11/12 1256  NA 136 139 141 141 140 136  136  K 4.8 4.0 4.3 4.0 3.8 4.0 4.0  CL 105 105 107 105 104 103 102  CO2 20 20 23 22 24 21 20   BUN 57* 51* 51* 50* 49* 39* 36*  CREATININE 6.76* 6.74* 6.85* 6.95* 7.13* 6.16* 5.71*  CALCIUM 9.2 8.8 8.6 8.4 8.5 8.7 8.7  GLUCOSE 218* 134* 136* 103* 55* 165* 251*  MG -- -- -- -- -- -- --  PHOS 3.7 4.6 5.0* -- -- -- --  PROT -- -- -- -- -- -- --  ALBUMIN 2.1* 2.2* 2.2* -- -- -- --    Lab 10/17/12 0500 10/14/12 0510 10/11/12 1256  WBC 8.4 6.5 8.6  HGB 9.3* 8.8* 9.5*  HCT 29.4* 27.2* 30.0*  PLT 315 229 220  RDW 14.1 14.1 14.1  MCV 85.2 86.3 86.2  MCHC 31.6 32.4 31.7   No results found for this basename: ALT:5,AST:5,ALKPHOS:5,BILITOT:5,BILIDIR:5 in the last 168 hours No results found for this basename: AMMONIA:3,URICACID:3 in the last 168 hours No results found for this basename: PTH:3 in the last 168 hours  Urine Studies  Basename 10/12/12 1153  COLORURINE YELLOW  APPEARANCEUR CLOUDY*  LABSPEC 1.018  PHURINE 6.0  GLUCOSEU 250*  HGBUR LARGE*  BILIRUBINUR NEGATIVE  KETONESUR NEGATIVE  PROTEINUR >300*  UROBILINOGEN 1.0  NITRITE NEGATIVE  LEUKOCYTESUR NEGATIVE   Results for SUMANTH, WOODRING (MRN [id number]) as of 10/15/2012 08:49  Ref. Range 10/14/2012 12:23  Iron Latest Range: 42-135 ug/dL 34 (L)  UIBC Latest Range: 125-400 ug/dL 161  TIBC Latest Range: 215-435  ug/dL 195 (L)  Saturation Ratios Latest Range: 20-55 % 17 (L)  Ferritin Latest Range: 22-322 ng/mL 359 (H)    Medications:       . ipratropium  0.5 mg Nebulization Q4H   And  . albuterol  5 mg Nebulization Q4H  . atorvastatin  80 mg Oral q1800  . calcitRIOL  0.25 mcg Oral Daily  . carvedilol  12.5 mg Oral BID WC  . cefUROXime (ZINACEF)  IV  1.5 g Intravenous On Call to OR  . fluticasone  2 spray Each Nare Daily  . furosemide (LASIX) = OR > 100 MG IVPB  160 mg Intravenous TID  . hydrALAZINE  50 mg Oral Q8H  . insulin aspart  0-15 Units Subcutaneous TID WC  . insulin detemir  65 Units Subcutaneous BID  .  isosorbide mononitrate  60 mg Oral Daily  . levofloxacin  500 mg Oral Q48H  . potassium chloride  20 mEq Oral Daily  . predniSONE  50 mg Oral Q breakfast  . sodium chloride  3 mL Intravenous Q12H     Assessment/ Plan:    # CKD stage V - (secondary to HTN and DM nephropathy) Cr stable.  Continues to have good UOP not grossly uremic, fluid overload improving.  Watch for continued diuresis and expected improvement in Cr.  Would consider HD if any signs of uremia but not acutely anticipated;   Transition from IV lasix to po Torsemide following surgery  - likely d/c 1/25 to assure no acute worsening sx following surgery  - minimize fluids given during surgery  # Volume Overload:  Still overtly overloaded, continues with good UOP and kidney function stable, continue aggressive diuresis  # COPD Exacerbation: started on Levaquin and Steroids 1/23.  Remains without leukocytosis and afebrile.  Monitor for clinical improvement and titrate nebs as appropriate.    # HTN: Continues to be elevated. per primary    # Vascular Access: Appreciate Dr. [name] reassement of this patient.  Plan to proceed with placement of permanent AV fistula in left forearm later this week  # Anemia - Hb 9.5, improved with diuresis. Iron and %sat low; s/p IV feraheme with reported itching - added to allergy list.   # LE DVT - clot located in gastroc vein; now on Lovenox but held for surgery.  # Medical Non-compliance:  We have discussed the imminent reality of HD in near future.  He is currently is (and apparently has been long term) in denial about his long term prognosis of his kidney disease as well as cardiovascular risk factors including DM, HTN, CAD and untreated/undertreated OSA.  Discussed aggressive treatment with him.  Will have nutrition discuss CHO Modified Renal Diet  # CHF:  Followed by [facility]DS as OP.   #OSA -  Refused CPAP overnight; likely non compliant at home; consider repeat Sleep Study vs refer to Pulm  for eval for nasal CPAP due to mask discomfort # DM 2 # CAD w hx MI/stents # Hx CVA # Constipation: given Sorbitol;   >d/c Milk of Magnesia as OP given potential mag toxicity  Gerda Diss, DO [facility] Resident - PGY-2 10/17/2012 7:52 AM I have seen and examined this patient and agree with plan per Dr Paulla Fore.  SP Lt AVF placement.  No uremic sxs.  He can go from my standpoint and will arrange FU appt with Dr Deterding as outpt. Thinh Cuccaro T,MD 10/17/2012 1:38 PM

## 2012-10-17 NOTE — Progress Notes (Signed)
I examined this patient and discussed the care plan with Dr Lacinda Axon and the [facility] team and agree with assessment and plan as documented in the progress note above. I saw him after the AV graft had been done and he was alert and in no respiratory distress. His wheezing was minimal at the time of my examination.

## 2012-10-17 NOTE — Preoperative (Signed)
Beta Blockers   Reason not to administer Beta Blockers:Not Applicable 

## 2012-10-17 NOTE — Progress Notes (Signed)
Family Medicine Teaching Service Daily Progress Note Service Page: [phone]  Subjective:  Feeling well this am. Seems anxious about surgery this am.  Objective: Temp:  [97 F (36.1 C)-98.2 F (36.8 C)] 98 F (36.7 C) (01/24 0228) Pulse Rate:  [90-98] 92  (01/24 0228) Resp:  [21-22] 22  (01/24 0228) BP: (152-167)/(74-97) 158/74 mmHg (01/24 0228) SpO2:  [95 %-98 %] 95 % (01/24 0228) FiO2 (%):  [21 %] 21 % (01/23 1635) Weight:  [294 lb 8.6 oz (133.6 kg)] 294 lb 8.6 oz (133.6 kg) (01/24 0228)   Intake/Output Summary (Last 24 hours) at 10/17/12 0658 Last data filed at 10/17/12 0300  Gross per 24 hour  Intake    580 ml  Output   1600 ml  Net  -1020 ml   Physical Exam: General: sitting up in chair this am, talking on the phone. Cardiovascular: RRR. No murmurs, rubs, or gallops. Respiratory: Normal work of breathing. Diffuse wheezing throughout. Abdomen: obese, soft, nontender. Extremities: 2+ pretibial pitting edema bilaterally.  CBC BMET   Lab 10/17/12 0500 10/14/12 0510 10/11/12 1256  WBC 8.4 6.5 8.6  HGB 9.3* 8.8* 9.5*  HCT 29.4* 27.2* 30.0*  PLT 315 229 220    Lab 10/17/12 0500 10/16/12 0553 10/15/12 0604  NA 136 139 141  K 4.8 4.0 4.3  CL 105 105 107  CO2 20 20 23   BUN 57* 51* 51*  CREATININE 6.76* 6.74* 6.85*  GLUCOSE 218* 134* 136*  CALCIUM 9.2 8.8 8.6     Imaging/Diagnostic Tests:  2D Echo: Study Conclusions  - Left ventricle: The cavity size was normal. There was moderate concentric hypertrophy. Systolic function was mildly to moderately reduced. The estimated ejection fraction was in the range of 40% to 45%. Probable moderate hypokinesis of the lateral myocardium. Features are consistent with a pseudonormal left ventricular filling pattern, with concomitant abnormal relaxation and increased filling pressure (grade 2 diastolic dysfunction). Doppler parameters are consistent with elevated mean left atrial filling pressure. - Mitral valve:  Calcified annulus. Mildly thickened leaflets - Left atrium: The atrium was moderately dilated. - Right ventricle: The cavity size was mildly dilated. Systolic function was mildly reduced. - Right atrium: The atrium was mildly dilated. - Pericardium, extracardiac: A trivial pericardial effusion was identified. Impressions:  - Exaggerated mitral flow variation is noted. Consider constrictive physiology versus increased work of breathing (e.g. COPD exacerbation).  LE Doppler: DVT noted in the gastrocnemius  Dg Chest 2 View 10/12/2012 IMPRESSION: Improved to resolved pulmonary venous congestion.    Nm Pulmonary Perf And Vent 10/12/2012  IMPRESSION: No evidence of pulmonary embolism.   Dg Chest Portable 1 View 10/11/2012 IMPRESSION: Cardiomegaly with pulmonary vascular congestion and interstitial opacities, more prominent compared to prior chest radiographs. Findings are most consistent with mild congestive heart failure pattern.  Viral infection is felt to be less likely.  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   # Dyspnea - Likely secondary to Acute combined systolic & diastolic CHF exacerbation and COPD exacerbation .  Chest xray and elevated proBNP (7437) consistent with CHF exacerbation.  Echo obtained and revealed decreased EF of 40-45% and Grade 2 diastolic dysfunction.  VQ scan obtained to assess for PE.  Scan was negative.   CHF exacerbation  - Continuing Diuresis. Will switch to PO torsemide today. Renal following and we  greatly appreciate   their input.  COPD Exacerbation  - Continue Duonebs Q4 hours, Q2PRN Albuterol  - Levaquin (Day 2)  - Prednisone 50 mg (Day 2)  # Lower extremity DVT - LE dopplers showed RLE DVT.  Patient on therapeutic Lovenox 130 mg daily (per  pharmacy) - Lovenox currently being held for surgery today.  # Fever- 102.6 @ 2200 on 1/18. Likely secondary to LE DVT. - Blood cultures and urine cultures obtained and were negative. - Patient continues to be afebrile   # CKD, stage 5 - Worsening renal function/creatinine.  Creatinine 5 prior to admission.  - Nephrology following and we greatly appreciate their help.  - Creatinine virtually unchanged from yesterday (6.74 --> 6.76) - Patient to go for AV fistula today   # CAD - Will continue Coreg 12.5 BID, Hydralazine 50 mg TID/Imdur 60 mg and Atorvastatin 80 mg. - Holding Plavix for surgery.  # DM-2 - CBG's rising secondary to Prednisone. CBG this am 207. - Levemir increased to 70 units BID and Moderate SSI  FEN/GI: Renal diet PPx: Lovenox (currently holding for surgery) Dispo: Pending clinical improvement Code Status: Full code  Thersa Salt, DO 10/17/2012, 6:58 AM

## 2012-10-17 NOTE — H&P (View-Only) (Signed)
Patient ID: Jonathon Bailey, male   DOB: 1956/04/19, 57 y.o.   MRN: [id number] For OR in am. Plan LUE AVF if feasable---if not will insert AVG>

## 2012-10-17 NOTE — Anesthesia Postprocedure Evaluation (Signed)
  Anesthesia Post-op Note  Patient: Jonathon Bailey  Procedure(s) Performed: Procedure(s) (LRB) with comments: ARTERIOVENOUS (AV) FISTULA CREATION (Left)  Patient Location: PACU  Anesthesia Type:MAC  Level of Consciousness: awake and alert   Airway and Oxygen Therapy: Patient Spontanous Breathing  Post-op Pain: none  Post-op Assessment: Post-op Vital signs reviewed, Patient's Cardiovascular Status Stable, Respiratory Function Stable, Patent Airway and No signs of Nausea or vomiting  Post-op Vital Signs: Reviewed and stable  Complications: No apparent anesthesia complications

## 2012-10-17 NOTE — Progress Notes (Signed)
Patient is going for procedure this morning to the operating room. Blood pressure elevated 176/100, notified MD in  OR. Patient has scheduled B/P med and that was given with sips of water. Will continue to monitor as needed.

## 2012-10-17 NOTE — Telephone Encounter (Addendum)
Message copied by Gena Fray on Fri Oct 17, 2012  1:51 PM ------      Message from: Mylo, [place] K      Created: Fri Oct 17, 2012  1:09 PM      Regarding: schedule                   ----- Message -----         From: Gabriel Earing, PA         Sent: 10/17/2012  11:40 AM           To: Mena Goes, CMA            S/p left radio cephalic AVF [id number] by JDL.  F/u with him in 6 weeks.            Thanks,      Orlie Pollen letter to pt home address, pt was still admitted at time of notification, dpm

## 2012-10-17 NOTE — Anesthesia Preprocedure Evaluation (Addendum)
Anesthesia Evaluation  Patient identified by MRN, date of birth, ID band Patient awake    Reviewed: Allergy & Precautions, H&P , NPO status , Patient's Chart, lab work & pertinent test results, reviewed documented beta blocker date and time   Airway Mallampati: III TM Distance: >3 FB Neck ROM: Full    Dental No notable dental hx. (+) Teeth Intact and Dental Advisory Given   Pulmonary sleep apnea and Continuous Positive Airway Pressure Ventilation ,  breath sounds clear to auscultation  Pulmonary exam normal       Cardiovascular hypertension, On Medications and On Home Beta Blockers + CAD and + Past MI Rhythm:Regular Rate:Normal     Neuro/Psych PSYCHIATRIC DISORDERS Anxiety CVA, No Residual Symptoms    GI/Hepatic negative GI ROS, Neg liver ROS,   Endo/Other  diabetes, Type 1, Insulin Dependent  Renal/GU ESRF and DialysisRenal disease  negative genitourinary   Musculoskeletal   Abdominal   Peds  Hematology negative hematology ROS (+)   Anesthesia Other Findings   Reproductive/Obstetrics negative OB ROS                          Anesthesia Physical Anesthesia Plan  ASA: III  Anesthesia Plan: MAC   Post-op Pain Management:    Induction: Intravenous  Airway Management Planned: Mask  Additional Equipment:   Intra-op Plan:   Post-operative Plan: Extubation in OR  Informed Consent: I have reviewed the patients History and Physical, chart, labs and discussed the procedure including the risks, benefits and alternatives for the proposed anesthesia with the patient or authorized representative who has indicated his/her understanding and acceptance.   Dental advisory given  Plan Discussed with: CRNA and Anesthesiologist  Anesthesia Plan Comments:        Anesthesia Quick Evaluation

## 2012-10-17 NOTE — Progress Notes (Signed)
ANTICOAGULATION CONSULT NOTE - Follow Up Consult  Pharmacy Consult for Lovenox Indication: DVT  Allergies  Allergen Reactions  . Feraheme (Ferumoxytol) Itching    Severe itching.    Patient Measurements: Height: 6' (182.9 cm) Weight: 294 lb 8.6 oz (133.6 kg) (scale c) IBW/kg (Calculated) : 77.6  Heparin Dosing Weight: 133 kg  Vital Signs: Temp: 97.6 F (36.4 C) (01/24 1325) Temp src: Oral (01/24 1325) BP: 158/74 mmHg (01/24 1325) Pulse Rate: 94  (01/24 1325)  Labs:  Basename 10/17/12 0500 10/16/12 0553 10/15/12 0604  HGB 9.3* -- --  HCT 29.4* -- --  PLT 315 -- --  APTT -- -- --  LABPROT 13.5 -- --  INR 1.04 -- --  HEPARINUNFRC -- -- --  CREATININE 6.76* 6.74* 6.85*  CKTOTAL -- -- --  CKMB -- -- --  TROPONINI -- -- --    Estimated Creatinine Clearance: 17.5 ml/min (by C-G formula based on Cr of 6.76).   Assessment: Admit Complaint: increasing SOB  Anticoagulati: Full dose lovenox for rule out PE. VQ scan negative but dopplers positive for RLE DVT. CBC stable. CrCl<30.  Infectious Disease: Levaquin ordered for ?? WBC 8.4. Afebrile. 1/23 Levaquin >> 1/19 Vanc>> 1/19 1/19 Zosyn>> 1/20 1/19 blood >> NGTD 1/19 urine>> mult bacteria, recollect 1/18 MRSA PCR negative  Cardiovascular: CAD s/p stents, HTN, HLD, CHF (40-45%), BP 158/74, HR 94 Admit weight 308, Today's weight 293. Meds: Lipitor, Coreg, Hydralazine, Imdur, K+, Demadex  Endocrinology: DM, A1c 9.9, SSI/detemir, CBGs 131-207  Gastrointestinal / Nutrition: obese, Renal diet  Neurology: hx stroke - lipitor  Nephrology: CKD, sCr 6.76 (dec), CrCl 17, K 4.8 Kdur (reduced to once daily), Patient still with UOP and scr slight improvement; nephrology said to consider HD if uremic but not anticipated. AV fistula placed 1/24  Pulmonary: hx COPD, RA currently on Prednison  Hematology / Oncology: no bleeding noted. plts wnl. Faraheme 1/22  Goal of Therapy:  Anti-Xa level 0.6-1.2 units/ml 4hrs after LMWH  dose given Monitor platelets by anticoagulation protocol: Yes   Plan:  Continue lovenox 130mg  daily (last dose documented 1/22 1417) Oral anticoagulation plans??  Jonathon Bailey 10/17/2012,1:47 PM

## 2012-10-17 NOTE — Interval H&P Note (Signed)
History and Physical Interval Note:  10/17/2012 10:05 AM  Jonathon Bailey  has presented today for surgery, with the diagnosis of ESRD  The various methods of treatment have been discussed with the patient and family. After consideration of risks, benefits and other options for treatment, the patient has consented to  Procedure(s) (LRB) with comments: ARTERIOVENOUS (AV) FISTULA CREATION (Left) - VS GRAFT INSERTION as a surgical intervention .  The patient's history has been reviewed, patient examined, no change in status, stable for surgery.  I have reviewed the patient's chart and labs.  Questions were answered to the patient's satisfaction.     [name]

## 2012-10-17 NOTE — Transfer of Care (Signed)
Immediate Anesthesia Transfer of Care Note  Patient: Jonathon Bailey  Procedure(s) Performed: Procedure(s) (LRB) with comments: ARTERIOVENOUS (AV) FISTULA CREATION (Left)  Patient Location: PACU  Anesthesia Type:MAC  Level of Consciousness: awake, alert  and oriented  Airway & Oxygen Therapy: Patient Spontanous Breathing  Post-op Assessment: Report given to PACU RN and Post -op Vital signs reviewed and stable  Post vital signs: Reviewed and stable  Complications: No apparent anesthesia complications

## 2012-10-17 NOTE — Op Note (Signed)
OPERATIVE REPORT  Date of Surgery: 10/11/2012 - 10/17/2012  Surgeon: [name], MD  Assistant: Dionicio Stall  Pre-op Diagnosis: End Stage Renal Disease  Post-op Diagnosis: End Stage Renal Disease  Procedure: Procedure(s): ARTERIOVENOUS (AV) FISTULA CREATION-left radial-cephalic Anesthesia: MAC  EBL: Minimal  Complications: None  Procedure Details: Patient was taken to the operating room placed in the supine position at which time the left upper extremity was prepped with Betadine scrub and solution draped in routine sterile manner. Radial artery and cephalic veins were marked at the wrist level. After infiltration with 1% Xylocaine epinephrine a short longitudinal incision was made midway between the radial artery and cephalic vein proximal to the wrist. Incision was carried down through subcutaneous tissue cephalic vein was identified and dissected free branches ligated with 4-0 silk ties and divided. It was transected after ligating it distally gently dilated with heparinized saline it was about a 2-1/2 mm vein throughout to be palpated and dilated up to the antecubital area. Radial artery was exposed beneath the fascia. It was a 3 mm vessel with an excellent pulse and normal in appearance. Artery was occluded proximally and distally with Vesseloops the 15 blade extended with Potts scissors. It would accept a 3 mm dilator had excellent inflow. Vein was carefully measured spatulated and anastomosed end to side with 6-0 Prolene. Vessel loops were then released and there was an excellent pulse and thrill in the vein up to the antecubital area. Adequate hemostasis was achieved wounds closed in layers with Vicryl in a subcuticular fashion with Dermabond patient taken to the recovery room in stable condition   [name], MD 10/17/2012 11:51 AM

## 2012-10-18 LAB — RENAL FUNCTION PANEL
BUN: 70 mg/dL — ABNORMAL HIGH (ref 6–23)
CO2: 22 mEq/L (ref 19–32)
Calcium: 9 mg/dL (ref 8.4–10.5)
Creatinine, Ser: 7.27 mg/dL — ABNORMAL HIGH (ref 0.50–1.35)
GFR calc non Af Amer: 8 mL/min — ABNORMAL LOW (ref >90)

## 2012-10-18 LAB — CULTURE, BLOOD (ROUTINE X 2)
Culture: NO GROWTH
Culture: NO GROWTH

## 2012-10-18 LAB — GLUCOSE, CAPILLARY: Glucose-Capillary: 249 mg/dL — ABNORMAL HIGH (ref 70–99)

## 2012-10-18 MED ORDER — INSULIN DETEMIR 100 UNIT/ML ~~LOC~~ SOLN
75.0000 [IU] | Freq: Two times a day (BID) | SUBCUTANEOUS | Status: DC
  Administered 2012-10-18: 75 [IU] via SUBCUTANEOUS

## 2012-10-18 MED ORDER — LEVOFLOXACIN 500 MG PO TABS: 500.0000 mg | ORAL_TABLET | ORAL | Status: AC

## 2012-10-18 MED ORDER — TIOTROPIUM BROMIDE MONOHYDRATE 18 MCG IN CAPS: 18.0000 ug | ORAL_CAPSULE | Freq: Every day | RESPIRATORY_TRACT | Status: DC

## 2012-10-18 MED ORDER — HEPARIN SODIUM (PORCINE) 5000 UNIT/ML IJ SOLN
5000.0000 [IU] | Freq: Three times a day (TID) | INTRAMUSCULAR | Status: DC
  Administered 2012-10-18: 5000 [IU] via SUBCUTANEOUS
  Filled 2012-10-18 (×3): qty 1

## 2012-10-18 MED ORDER — HYDRALAZINE HCL 50 MG PO TABS: 50.0000 mg | ORAL_TABLET | Freq: Three times a day (TID) | ORAL | Status: DC

## 2012-10-18 MED ORDER — FUROSEMIDE 80 MG PO TABS
160.0000 mg | ORAL_TABLET | Freq: Three times a day (TID) | ORAL | Status: DC
  Administered 2012-10-18 (×2): 160 mg via ORAL
  Filled 2012-10-18 (×3): qty 2

## 2012-10-18 MED ORDER — POTASSIUM CHLORIDE CRYS ER 20 MEQ PO TBCR: 20.0000 meq | EXTENDED_RELEASE_TABLET | Freq: Every day | ORAL | Status: DC

## 2012-10-18 MED ORDER — FUROSEMIDE 80 MG PO TABS: 160.0000 mg | ORAL_TABLET | Freq: Three times a day (TID) | ORAL | Status: DC

## 2012-10-18 MED ORDER — CLOPIDOGREL BISULFATE 75 MG PO TABS
75.0000 mg | ORAL_TABLET | Freq: Every day | ORAL | Status: DC
Start: 2012-10-19 — End: 2012-10-18
  Filled 2012-10-18: qty 1

## 2012-10-18 MED ORDER — FLUTICASONE PROPIONATE 50 MCG/ACT NA SUSP: 2.0000 | Freq: Every day | NASAL | Status: DC

## 2012-10-18 NOTE — Progress Notes (Signed)
Family Medicine Teaching Service Daily Progress Note Service Page: [phone]  Subjective:  Reports that he does not want to take prednisone.  States it is making him "swell."  He also feels that this is worsening his kidney function.  Objective: Temp:  [97.5 F (36.4 C)-97.8 F (36.6 C)] 97.5 F (36.4 C) (01/25 0557) Pulse Rate:  [91-97] 97  (01/25 0557) Resp:  [14-20] 20  (01/25 0557) BP: (131-159)/(74-85) 151/85 mmHg (01/25 0557) SpO2:  [92 %-98 %] 97 % (01/25 0812) Weight:  [297 lb 6.4 oz (134.9 kg)] 297 lb 6.4 oz (134.9 kg) (01/25 0557)   Intake/Output Summary (Last 24 hours) at 10/18/12 1025 Last data filed at 10/18/12 0818  Gross per 24 hour  Intake   1550 ml  Output    850 ml  Net    700 ml   Physical Exam: General: sitting up in chair this am, talking on the phone. Cardiovascular: RRR. No murmurs, rubs, or gallops. Respiratory: Normal work of breathing.  Scattered rhonchi.  No wheezing appreciated. Abdomen: obese, soft, nontender. Extremities: 1-2+ pretibial pitting edema bilaterally. Neuro: No focal deficits. AO x 3.  CBC BMET   Lab 10/17/12 0500 10/14/12 0510 10/11/12 1256  WBC 8.4 6.5 8.6  HGB 9.3* 8.8* 9.5*  HCT 29.4* 27.2* 30.0*  PLT 315 229 220    Lab 10/18/12 0500 10/17/12 0500 10/16/12 0553  NA 136 136 139  K 4.8 4.8 4.0  CL 102 105 105  CO2 22 20 20   BUN 70* 57* 51*  CREATININE 7.27* 6.76* 6.74*  GLUCOSE 226* 218* 134*  CALCIUM 9.0 9.2 8.8     Imaging/Diagnostic Tests:  2D Echo: Study Conclusions  - Left ventricle: The cavity size was normal. There was moderate concentric hypertrophy. Systolic function was mildly to moderately reduced. The estimated ejection fraction was in the range of 40% to 45%. Probable moderate hypokinesis of the lateral myocardium. Features are consistent with a pseudonormal left ventricular filling pattern, with concomitant abnormal relaxation and increased filling pressure (grade 2 diastolic dysfunction).  Doppler parameters are consistent with elevated mean left atrial filling pressure. - Mitral valve: Calcified annulus. Mildly thickened leaflets - Left atrium: The atrium was moderately dilated. - Right ventricle: The cavity size was mildly dilated. Systolic function was mildly reduced. - Right atrium: The atrium was mildly dilated. - Pericardium, extracardiac: A trivial pericardial effusion was identified. Impressions:  - Exaggerated mitral flow variation is noted. Consider constrictive physiology versus increased work of breathing (e.g. COPD exacerbation).  LE Venous Duplex:  DVT noted in the gastrocnemius.  Repeat on 1/24 revealed no evidence of DVT.  Dg Chest 2 View 10/12/2012 IMPRESSION: Improved to resolved pulmonary venous congestion.    Nm Pulmonary Perf And Vent 10/12/2012  IMPRESSION: No evidence of pulmonary embolism.   Dg Chest Portable 1 View 10/11/2012 IMPRESSION: Cardiomegaly with pulmonary vascular congestion and interstitial opacities, more prominent compared to prior chest radiographs. Findings are most consistent with mild congestive heart failure pattern.  Viral infection is felt to be less likely.  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   # Dyspnea - Secondary to Acute combined systolic & diastolic CHF exacerbation and COPD exacerbation .  Chest xray and elevated proBNP (7437) consistent with CHF exacerbation.  Echo obtained and revealed decreased EF of 40-45% and Grade 2 diastolic  dysfunction.  VQ scan obtained to assess for PE.  Scan was negative.   CHF exacerbation  - Patient switched to PO Torsemide 80 mg TID yesterday.  Net positive over the past 24 hours.   - Renal following and we greatly appreciate their help.  Switching back to Lasix 160 mg TID  today given      continued signs of volume overload and lack of diuresis. Okay to D/C home today per their recommendations.  COPD Exacerbation  - Continue Duonebs Q4 hours, Q2PRN Albuterol  - Levaquin (Day 3) - Given every 2 days secondary to renal insufficiency  - Discontinuing Prednisone today given patient request and further refusal  # Lower extremity DVT - LE dopplers showed RLE DVT in the gastrocnemius.  Patient was treated with Lovenox.  Repeat doppler yesterday showed no evidence of DVT.  Given distal location and resolution will not continue treatment at this time.  Patient will need repeat LE doppler as an outpatient in 3-4 weeks.  # Fever- 102.6 @ 2200 on 1/18. Likely secondary to LE DVT. - Blood cultures and urine cultures obtained and were negative. - Patient continues to be afebrile   # CKD, stage 5 - Worsening renal function/creatinine.  Creatinine 5 prior to admission.  - Nephrology following and we greatly appreciate their help.  - BUN/Creatinine rose today - 70/7.27 - AVF successfully placed. - Will continue to monitor.  Diuretics as above.  # CAD - Will continue Plavix, Coreg 12.5 BID, Hydralazine 50 mg TID/Imdur 60 mg and Atorvastatin 80 mg.  # DM-2 - Levemir increased to 75 units BID (home dose) and Moderate SSI  FEN/GI: Renal diet PPx: Heparin SQ Dispo: D/C home today. Code Status: Full code  Thersa Salt, DO 10/18/2012, 10:25 AM

## 2012-10-18 NOTE — Progress Notes (Addendum)
[facility] Postoperative hemodialysis access   Date of Surgery:  10/17/12 Surgeon: Kellie Simmering   Subjective:  Some soreness of left wrist.    PHYSICAL EXAMINATION:  Filed Vitals:   10/18/12 0557  BP: 151/85  Pulse: 97  Temp: 97.5 F (36.4 C)  Resp: 20    Incision is c/d/i Hand grip is equal bialterally and sensation in digits is intact; There is  Thrill; there is bruit. The graft/fistula is palpable  Left radial pulse is palpabale.  ASSESSMENT/PLAN:  Jonathon Bailey is a 57 y.o. year old male who is s/p left radiocephalic AVF.  -graft is patent -pt does not have evidence of steal sx -f/u with Dr. Kellie Simmering in 6 weeks to check maturation of AVF -will sign off-call as needed.   Leontine Locket, PA-C [facility] [phone]  Agree with above  Annamarie Major

## 2012-10-18 NOTE — Progress Notes (Signed)
1530 discharge instructions given to pt .Verbalized understanding . 1630 discharged home Wheeled to lobby by SN

## 2012-10-18 NOTE — Progress Notes (Addendum)
[facility] Progress Note  Patient name: Jonathon Bailey Medical record number: [id number] Date of birth: 1956/04/06 Age: 57 y.o. Gender: male  Primary Care Provider: Thersa Salt, DO Primary OP Nephrologist: [facility] Service: FMTS Service Requesting Consult: Same  Subjective:   S/p surgery, Feels prednisone is causing his worsening renal failure No n/v.     Objective:   Vitals: Temp:  [97.5 F (36.4 C)-98 F (36.7 C)] 97.5 F (36.4 C) (01/25 0557) Pulse Rate:  [91-97] 97  (01/25 0557) Resp:  [14-22] 20  (01/25 0557) BP: (131-176)/(74-100) 151/85 mmHg (01/25 0557) SpO2:  [92 %-98 %] 97 % (01/25 0812) Weight:  [297 lb 6.4 oz (134.9 kg)] 297 lb 6.4 oz (134.9 kg) (01/25 0557)  Weight: Wt Readings from Last 3 Encounters:  10/18/12 297 lb 6.4 oz (134.9 kg)  10/18/12 297 lb 6.4 oz (134.9 kg)  08/25/12 304 lb (137.893 kg)   Weight change: 2 lb 13.9 oz (1.3 kg)  I&Os: Yesterday: 01/24 0701 - 01/25 0700 In: 1310 [P.O.:960; I.V.:350] Out: 850 [Urine:850] This shift: Total I/O In: 240 [P.O.:240] Out: -   Physical Exam:  Gen: obese AAM in no distress, breathing slightly labored, sitting in bed side chair.  mild conversational dyspnea but continues to improve Skin: no rash, cyanosis  HEENT: EOMI, sclera anicteric, throat clear  Chest:  Few bilat basilar crackles coarse, and coarse bilat wheezing (improved) and rhonchi CV: regular, no rub or gallop, Abdomen: soft, obese, Ext: 1+ LE edema bilat, no joint effusion or deformity, no gangrene or ulceration,  pedal pulses intact  + bruit Lt AVF Neuro: alert, Ox3, no focal deficit, no asterixis  Imaging: No results found. 10/13/2012 - 2D ECHO: EF [id number], Grade 2 Diastolic dysfunction, PA pressure not evaluated; Exaggerated mitral flow consistent with COPD Exacerbation   Labs: RENAL LABS  Lab 10/18/12 0500 10/17/12 0500 10/16/12 0553 10/15/12 0604 10/14/12 0500 10/13/12 0610 10/12/12 0444  10/11/12 1256  NA 136 136 139 141 141 140 136 136  K 4.8 4.8 4.0 4.3 4.0 3.8 4.0 4.0  CL 102 105 105 107 105 104 103 102  CO2 22 20 20 23 22 24 21 20   BUN 70* 57* 51* 51* 50* 49* 39* 36*  CREATININE 7.27* 6.76* 6.74* 6.85* 6.95* 7.13* 6.16* 5.71*  CALCIUM 9.0 9.2 8.8 8.6 8.4 8.5 8.7 8.7  GLUCOSE 226* 218* 134* 136* 103* 55* 165* 251*  MG -- -- -- -- -- -- -- --  PHOS 4.8* 3.7 4.6 -- -- -- -- --  PROT -- -- -- -- -- -- -- --  ALBUMIN 2.3* 2.1* 2.2* -- -- -- -- --    Lab 10/17/12 0500 10/14/12 0510 10/11/12 1256  WBC 8.4 6.5 8.6  HGB 9.3* 8.8* 9.5*  HCT 29.4* 27.2* 30.0*  PLT 315 229 220  RDW 14.1 14.1 14.1  MCV 85.2 86.3 86.2  MCHC 31.6 32.4 31.7   No results found for this basename: ALT:5,AST:5,ALKPHOS:5,BILITOT:5,BILIDIR:5 in the last 168 hours No results found for this basename: AMMONIA:3,URICACID:3 in the last 168 hours No results found for this basename: PTH:3 in the last 168 hours  Urine Studies  Basename 10/12/12 1153  COLORURINE YELLOW  APPEARANCEUR CLOUDY*  LABSPEC 1.018  PHURINE 6.0  GLUCOSEU 250*  HGBUR LARGE*  BILIRUBINUR NEGATIVE  KETONESUR NEGATIVE  PROTEINUR >300*  UROBILINOGEN 1.0  NITRITE NEGATIVE  LEUKOCYTESUR NEGATIVE   Results for JASDEEP, MCKOWN (MRN [id number]) as of 10/15/2012 08:49  Ref. Range 10/14/2012 12:23  Iron Latest Range: 42-135 ug/dL 34 (L)  UIBC Latest Range: 125-400 ug/dL 161  TIBC Latest Range: 215-435 ug/dL 195 (L)  Saturation Ratios Latest Range: 20-55 % 17 (L)  Ferritin Latest Range: 22-322 ng/mL 359 (H)    Medications:       . ipratropium  0.5 mg Nebulization Q4H   And  . albuterol  5 mg Nebulization Q4H  . atorvastatin  80 mg Oral q1800  . calcitRIOL  0.25 mcg Oral Daily  . carvedilol  12.5 mg Oral BID WC  . clopidogrel  75 mg Oral Q breakfast  . fluticasone  2 spray Each Nare Daily  . heparin subcutaneous  5,000 Units Subcutaneous Q8H  . hydrALAZINE  50 mg Oral Q8H  . insulin aspart  0-15 Units Subcutaneous  TID WC  . insulin detemir  75 Units Subcutaneous BID  . isosorbide mononitrate  60 mg Oral Daily  . levofloxacin  500 mg Oral Q48H  . potassium chloride  20 mEq Oral Daily  . predniSONE  50 mg Oral Q breakfast  . sodium chloride  3 mL Intravenous Q12H  . 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  Patient not interested in starting diaysis any time soon. He is stable with creat in 7-8 range and not grossly uremic, eating well and MS is baseline.  No further Tx or work-up needed here, OK for d/c from renal standpoint. Will sign off, plan for him to f/u with Dr. Jimmy Footman at [facility].    # Volume Overload:  Mostly resolved.  Mild edema of the extremities persist, cont lasix on OP setting.   # COPD Exacerbation: started on Levaquin and Steroids 1/23.  Wants to stop Steroids, pt attributing steroids to worsening renal function.  Remains without leukocytosis and afebrile.  Monitor for clinical improvement and titrate nebs as appropriate.  Discussed with Primary; also CBGs elevated from steroids  # HTN: Continues to be elevated. per primary    # Vascular Access: permanent AV fistula in left forearm. Has AVF in L arm now  # Anemia - Hb 9.5, improved with diuresis. Iron and %sat low; s/p IV feraheme with reported itching - added to allergy list.   # LE DVT - clot located in gastroc vein; repeated yesterday; no evidence currently. No anticoag indicated.  Will need OP follow up  # Medical Non-compliance:  We have discussed the imminent reality of HD in near future.  He is currently is (and apparently has been long term) in denial about his long term prognosis of his kidney disease as well as cardiovascular risk factors including DM, HTN, CAD and untreated/undertreated OSA.  Discussed aggressive treatment with him.  Not agreeable to renal diet  # CHF:   Followed by [facility] as OP.   #OSA -  Refused CPAP overnight; likely non compliant at home; consider repeat Sleep Study vs refer to Pulm for eval for nasal CPAP due to mask discomfort # DM 2 - worse with steroids # CAD w hx MI/stents # Hx CVA # Constipation: given Sorbitol;   >d/c Milk of Magnesia as OP given potential mag toxicity  Gerda Diss, DO [facility] Resident - PGY-2 10/18/2012 8:50 AM  Patient seen and examined and above reviewed.  I agree with plan as above, with  additions as indicated. [name]  MD [employer] [phone] pgr    [phone] cell 10/18/2012, 3:45 PM

## 2012-10-18 NOTE — Progress Notes (Signed)
I examined this patient and discussed the care plan with Dr Lacinda Axon and the [facility] team and agree with assessment and plan as documented in the progress note above.

## 2012-10-18 NOTE — Discharge Summary (Signed)
[facility] Discharge Summary  Patient name: Jonathon Bailey Medical record number: [id number] Date of birth: 25-Jul-1956 Age: 57 y.o. 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  1) Dyspnea - Secondary to Acute combined systolic & diastolic CHF exacerbation and COPD exacerbation  - Clinical picture, elevated proBNP, and chest xray findings were consistent with CHF exacerbation.  Echo was also obtained during admission and revealed reduced systolic function (EF A999333) and grade 2 diastolic dysfunction. - Patient was diuresed with IV Lasix (initially 160 mg QID, then 160 mg TID).  Patient was also continued on Coreg and Imdur.  Hydralazine added during admission (titrated up to 50 mg TID) - COPD exacerbation treated with Prednisone, Levaquin, and Duonebs.  - Patient discharged home on Lasix 160 mg TID, home Coreg, Imdur/Hydralazine, Levaquin (one additional dose to complete 6 day course), Albuterol and Spiriva.    2) CKD, stage 5 - Creatinine 5.71 on admission - Patient followed by Nephrology during admission - Patient was diuresed effectively with IV lasix (see above) - Creatinine worsened during admission and was 7.27 prior to discharge. - Patient did not need urgent/emergent dialysis but will need dialysis in the near future.   AV fistula was placed during hospitalization.  3) Coronary artery disease and HTN - Patient was continued on lower dose of Coreg (12.5 BID) and Imdur 60 mg daily.  Hydralazine was added during admission and was titrated up to 50 mg TID.  Patient was also continued on Plavix.  Plavix was discontinued prior to AV fistula placement and then subsequently restarted.  4) Diabetes mellitus, type 2 - Patient was continued on Levemir and SSI.  5) DVT - Given LE edema, LE dopplers were obtained. - Dopplers revealed acute deep vein thrombosis involving the right gastrocnemius vein. - Patient was treated with therapeutic Lovenox for 4 days. - This was later discontinued and repeat LE doppler revealed no evidence of DVT - Given distal location of DVT and resolution, further treatment was not indicated.   Significant Labs and Imaging:   CBC BMET   Lab 10/17/12 0500 10/14/12 0510  WBC 8.4 6.5  HGB 9.3* 8.8*  HCT 29.4* 27.2*  PLT 315 229    Lab 10/18/12 0500 10/17/12 0500 10/16/12 0553  NA 136 136 139  K 4.8 4.8 4.0  CL 102 105 105  CO2 22 20 20   BUN 70* 57* 51*  CREATININE 7.27* 6.76* 6.74*  GLUCOSE 226* 218* 134*  CALCIUM 9.0 9.2 8.8     Dg Chest 2 View 10/13/2012  IMPRESSION:  1.  Further improvement in the interstitial markings.  Residual bronchitic changes. 2.  Fullness of the superior aspect the left hilum and aortopulmonary window.   Dg Chest 2 View 10/12/2012  IMPRESSION: Improved to resolved pulmonary venous congestion.  Nm Pulmonary Perf And Vent 10/12/2012 IMPRESSION: No evidence of pulmonary embolism.  2D Echo: Study Conclusions - Left ventricle: The cavity size was  normal. There was moderate concentric hypertrophy. Systolic function was mildly to moderately reduced. The estimated ejection fraction was in the range of 40% to 45%. Probable moderate hypokinesis of the lateral myocardium. Features are consistent with a pseudonormal left ventricular filling pattern, with  concomitant abnormal relaxation and increased filling pressure (grade 2 diastolic dysfunction). Doppler parameters are consistent with elevated mean left atrial filling pressure. - Mitral valve: Calcified annulus. Mildly thickened leaflets - Left atrium: The atrium was moderately dilated. - Right ventricle: The cavity size was mildly dilated. Systolic function was mildly reduced. - Right atrium: The atrium was mildly dilated. - Pericardium, extracardiac: A trivial pericardial effusion was identified. Impressions: - Exaggerated mitral flow variation is noted. Consider constrictive physiology versus increased work of breathing (e.g. COPD exacerbation).  Lower extremity venous duplex - bilateral Summary: - Findings consistent with acute deep vein thrombosis involving the right gastrocnemius vein. An enlarged inguinal lymph node is noted bilaterally. - No evidence of deep vein thrombosis involving the left lower extremity. - No evidence of Baker's cyst on the right or left. Repeat study on 1/24 (Right leg) revealed no evidence of DVT.   Procedures: AV fistula creation - left radial-cephalic   Consultations: Vascular Surgery, Dr. Heath Gold. Kellie Simmering, Nephrology Dr. Jonnie Finner  Discharge Medications:    Medication List     As of 10/18/2012  2:24 PM    STOP taking these medications         MILK OF MAGNESIA PO      TAKE these medications         albuterol 108 (90 BASE) MCG/ACT inhaler   Commonly known as: PROVENTIL HFA;VENTOLIN HFA   Inhale 2 puffs into the lungs every 4 (four) hours as needed. For shortness of breath and wheezing      calcitRIOL 0.25 MCG capsule   Commonly known as: ROCALTROL   Take 0.25-0.5 mcg by mouth daily. Alternates takes 1 tablet and 2 capsules the next day      carvedilol 25 MG tablet   Commonly known as: COREG   Take 37.5 mg by mouth 2 (two) times daily with a meal.      clopidogrel 75 MG tablet   Commonly known as: PLAVIX   Take 1 tablet (75 mg total) by  mouth daily.      DSS 100 MG Caps   Take 100 mg by mouth 2 (two) times daily as needed for constipation.      fluticasone 50 MCG/ACT nasal spray   Commonly known as: FLONASE   Place 2 sprays into the nose daily.      furosemide 80 MG tablet   Commonly known as: LASIX   Take 2 tablets (160 mg total) by mouth 3 (three) times daily.      hydrALAZINE 50 MG tablet   Commonly known as: APRESOLINE   Take 1 tablet (50 mg total) by mouth 3 (three) times daily.      insulin detemir 100 UNIT/ML injection   Commonly known as: LEVEMIR   Inject 75 Units into the skin 2 (two) times daily. Please give pen if covered. 3 month supply.      Insulin Syringe-Needle U-100 25G X 1" 1 ML Misc   1 applicator by Does not apply route 2 (two) times daily. Please give needles 4 Levemir.      isosorbide mononitrate 60 MG 24 hr tablet   Commonly known as: IMDUR   Take 60 mg by mouth daily.      levofloxacin 500 MG  tablet   Commonly known as: LEVAQUIN   Take 1 tablet (500 mg total) by mouth every other day.   Start taking on: 10/20/2012      Medical Compression Stockings Misc   2 each by Does not apply route daily.      potassium chloride SA 20 MEQ tablet   Commonly known as: K-DUR,KLOR-CON   Take 1 tablet (20 mEq total) by mouth daily.      rosuvastatin 40 MG tablet   Commonly known as: CRESTOR   Take 40 mg by mouth at bedtime.      tiotropium 18 MCG inhalation capsule   Commonly known as: SPIRIVA   Place 1 capsule (18 mcg total) into inhaler and inhale daily.       Issues for Follow Up:  1) Recommend follow up BMP 2) Repeat LE doppler recommended in 3-4 weeks  Outstanding Results: None  Discharge Instructions: Patient was counseled important signs and symptoms that should prompt return to medical care, changes in medications, dietary instructions, activity restrictions, and follow up appointments.        Follow-up Information    Follow up with [name], MD. In 6 weeks. (Office will  call you to arrange your appt (sent))    Contact information:   [address] [place] [address] [phone]       Follow up with Thersa Salt, DO. On 10/23/2012. (3:00)    Contact information:   [place] [address] [phone]       Schedule an appointment as soon as possible for a visit with DETERDING,JAMES L, MD.   Contact information:   [place] [address] [phone]          Discharge Condition: Stable. Discharged home.   Thersa Salt, DO 10/18/2012, 2:24 PM

## 2012-10-20 ENCOUNTER — Encounter (HOSPITAL_COMMUNITY): Payer: Self-pay | Admitting: Vascular Surgery

## 2012-10-20 NOTE — Discharge Summary (Signed)
I examined this patient and discussed the care plan with Dr Lacinda Axon and the [facility] team and agree with assessment and plan as documented in the discharge note above.

## 2012-10-23 ENCOUNTER — Encounter: Payer: Self-pay | Admitting: Family Medicine

## 2012-10-23 ENCOUNTER — Ambulatory Visit (INDEPENDENT_AMBULATORY_CARE_PROVIDER_SITE_OTHER): Payer: Medicare Other | Admitting: Family Medicine

## 2012-10-23 VITALS — BP 137/66 | HR 92 | Temp 97.6°F | Ht 72.0 in | Wt 296.3 lb

## 2012-10-23 DIAGNOSIS — N185 Chronic kidney disease, stage 5: Secondary | ICD-10-CM

## 2012-10-23 DIAGNOSIS — R600 Localized edema: Secondary | ICD-10-CM

## 2012-10-23 DIAGNOSIS — J449 Chronic obstructive pulmonary disease, unspecified: Secondary | ICD-10-CM

## 2012-10-23 DIAGNOSIS — I10 Essential (primary) hypertension: Secondary | ICD-10-CM

## 2012-10-23 DIAGNOSIS — R609 Edema, unspecified: Secondary | ICD-10-CM

## 2012-10-23 DIAGNOSIS — IMO0002 Reserved for concepts with insufficient information to code with codable children: Secondary | ICD-10-CM

## 2012-10-23 DIAGNOSIS — I5042 Chronic combined systolic (congestive) and diastolic (congestive) heart failure: Secondary | ICD-10-CM

## 2012-10-23 DIAGNOSIS — E1165 Type 2 diabetes mellitus with hyperglycemia: Secondary | ICD-10-CM

## 2012-10-23 DIAGNOSIS — I2510 Atherosclerotic heart disease of native coronary artery without angina pectoris: Secondary | ICD-10-CM

## 2012-10-23 MED ORDER — ACCU-CHEK AVIVA PLUS W/DEVICE KIT: 1.0000 | PACK | Freq: Every day | Status: DC

## 2012-10-23 NOTE — Assessment & Plan Note (Addendum)
BMP obtained today to reassess creatinine. Patient will need close follow up with Renal Dr. Jimmy Footman.

## 2012-10-23 NOTE — Assessment & Plan Note (Signed)
Recent AV fistula on 1/24.  Arm significantly edematous today.  Unsure if this is normal or excessive in the setting of recent AV fistula placement.  Spoke with [facility] at [facility]. Patient's appointment rescheduled for tomorrow at 10:40 am so that NP can evaluate.

## 2012-10-23 NOTE — Assessment & Plan Note (Signed)
Stable.  Will continue Albuterol PRN and Spiriva.

## 2012-10-23 NOTE — Patient Instructions (Addendum)
It was good seeing you again.  Please be sure to continue to take all of your medications a prescribed.  I have schedule you an appointment with [facility] tomorrow 1/31 @ 1040 am (office is located at [facility])  Please return for follow up in 6 months.

## 2012-10-23 NOTE — Assessment & Plan Note (Signed)
Well controlled and at goal of <140/90. Will continue Hydralazine and Coreg.

## 2012-10-23 NOTE — Assessment & Plan Note (Signed)
Currently stable at this time.  Lungs CTAB and improving LE edema.  Will continue Coreg, Hydralazine/Imdur and Lasix 160 mg TID

## 2012-10-23 NOTE — Progress Notes (Signed)
Subjective:     Patient ID: Jonathon Bailey, male   DOB: 19-Mar-1956, 57 y.o.   MRN: [id number]  HPI 57 year old male with DM-2, CKD stage 5, COPD, and CAD who presents for hospital follow up.  Patient admitted from 1/18 - 1/25 for acute dyspnea secondary to CHF and COPD exacerbation.  Patient's renal function also found to be worsening and AV fistula was placed in preparation for future dialysis.  1) Combined systolic and diastolic congestive heart failure - Patient on Coreg, Imdur/hydralazine, Lasix 160 mg TID.  - Patient compliant with Lasix regimen (160 mg TID) and is take above medications as prescribed - Reports little SOB - Patient is able to ADL's and feels like his SOB and edema are much improved - ROS: denies worsening SOB, worsening lower extremity edema. No reports of orthopnea or PND  2) DM-2  Lab Results  Component Value Date   HGBA1C 9.9* 08/12/2012   - Patient reports compliance with Levemir 75 units BID - Patient unable to report blood sugar readings as his meter has been malfunctioning - ROS: Denies polydipsia, hypoglycemia  3) HTN - Patient now on Hydralazine 50 mg TID and Coreg 37.5 mg BID - Does not regularly check BP - ROS: denies chest pain, headache, dizziness/lightheadedness  4) COPD - Wheezing much improved with addition of Spiriva - Reports occasional wheezing and albuterol use  5) CKD stage 5 - Patient will likely need dialysis in the near future.  Now has AV fistula - L wrist - Followed by Dr. Jimmy Footman  6) CAD - Denies chest pain - Continues to be compliant with daily Plavix  Review of Systems See HPI    Objective:   Physical Exam  Filed Vitals:   10/23/12 1513  BP: 137/66  Pulse: 92  Temp: 97.6 F (36.4 C)   General: well appearing, NAD. Neck: supple, no thyromegaly. Heart: RRR, no murmurs, rubs, or gallops. Lungs: CTAB. No rales, rhonchi, or wheeze appreciated.  Abdomen: obese, soft, nontender, nondistended. No palpable  organomegaly. Extremities: no cyanosis, or clubbing.  Significant edema of left upper extremity which extends to elbow. Minimal erythema surrounding recent AV fistula site.  1-2+ pitting lower extremity edema.  Skin: no rashe. Psych: normal mood and affect. Neuro: no focal deficits.    Assessment:         Plan:

## 2012-10-23 NOTE — Assessment & Plan Note (Signed)
Will continue current regimen of Levemir 75 Units BID.  Patient given new meter today and instructed to write down CBG's and bring to next office visit.  Will obtain A1C at next visit and consider addition of bolus Novolog if still uncontrolled.

## 2012-10-23 NOTE — Assessment & Plan Note (Signed)
Continue plavix daily.

## 2012-10-24 ENCOUNTER — Ambulatory Visit (INDEPENDENT_AMBULATORY_CARE_PROVIDER_SITE_OTHER): Payer: Medicare Other | Admitting: Neurosurgery

## 2012-10-24 ENCOUNTER — Encounter: Payer: Self-pay | Admitting: Neurosurgery

## 2012-10-24 VITALS — BP 137/74 | HR 84 | Resp 16 | Ht 71.0 in | Wt 296.0 lb

## 2012-10-24 DIAGNOSIS — T82898A Other specified complication of vascular prosthetic devices, implants and grafts, initial encounter: Secondary | ICD-10-CM

## 2012-10-24 DIAGNOSIS — N186 End stage renal disease: Secondary | ICD-10-CM

## 2012-10-24 LAB — BASIC METABOLIC PANEL
BUN: 89 mg/dL — ABNORMAL HIGH (ref 6–23)
Calcium: 9 mg/dL (ref 8.4–10.5)
Creat: 7.17 mg/dL — ABNORMAL HIGH (ref 0.50–1.35)
Glucose, Bld: 152 mg/dL — ABNORMAL HIGH (ref 70–99)
Potassium: 5.1 mEq/L (ref 3.5–5.3)

## 2012-10-24 NOTE — Progress Notes (Signed)
Subjective:     Patient ID: Jonathon Bailey, male   DOB: [birth year], 57 y.o.   MRN: [id number]  HPI: 57 year old male patient that underwent a left radiocephalic fistula creation with Dr. Kellie Simmering on 10/17/2012. The patient called the office due to some swelling in his left upper extremity and asked to be seen today. The patient reports little in the way of pain and has no other complaints.   Review of Systems: 12 point review of systems is notable for the difficulties described above otherwise unremarkable     Objective:   Physical Exam: Afebrile, vital signs are stable, the surgical wound on the left forearm is healing, the Dermabond is still in place. There is mild edema from the biceps to the wrist extending through his hand. Dr. Bridgett Larsson examined the patient as well.     Assessment:     This is a patient one-week status post creation of left radiocephalic fistula with some edema, Dr. Bridgett Larsson explained to the patient he needs to elevate his arm or as the patient states he has not done so at all. Dr. Bridgett Larsson also explain to the patient this was normal edema and he does not think that there is a proximal occlusion. The patient is to followup with Dr. Kellie Simmering as scheduled.    Plan:     The patient states he has an appointment scheduled with Dr. Kellie Simmering and will followup with that. Dr. Bridgett Larsson to patient if his edema worsen to contact our office  and the patient stated understanding. Patient's questions were encouraged and answered.  Beatris Ship ANP  Clinic M.D.: Bridgett Larsson

## 2012-10-31 ENCOUNTER — Encounter: Payer: Self-pay | Admitting: Family Medicine

## 2012-11-03 ENCOUNTER — Telehealth: Payer: Self-pay | Admitting: Family Medicine

## 2012-11-03 NOTE — Telephone Encounter (Signed)
Patient may stop plavix prior to eye procedure as indicated by ophthalmologist (typically 1 week prior).  Patient should resume follow procedure.

## 2012-11-03 NOTE — Telephone Encounter (Signed)
Pt is having an eye procedure next week and the doctor wanted him to stop taking plavix and they wanted his PCP to OK this.  Also wanted to know if there is any other blood thinner that he is one.

## 2012-11-04 ENCOUNTER — Ambulatory Visit (HOSPITAL_COMMUNITY): Payer: Medicare Other

## 2012-11-11 ENCOUNTER — Other Ambulatory Visit (HOSPITAL_COMMUNITY): Payer: Self-pay | Admitting: *Deleted

## 2012-11-12 ENCOUNTER — Encounter (HOSPITAL_COMMUNITY): Payer: Medicare Other

## 2012-11-21 ENCOUNTER — Encounter (HOSPITAL_COMMUNITY)
Admission: RE | Admit: 2012-11-21 | Discharge: 2012-11-21 | Disposition: A | Discharge status: Home / Self Care | Payer: Medicare Other | Source: Ambulatory Visit | Attending: Nephrology | Admitting: Nephrology

## 2012-11-21 VITALS — BP 133/80 | HR 98 | Temp 97.8°F | Resp 18

## 2012-11-21 DIAGNOSIS — N186 End stage renal disease: Secondary | ICD-10-CM | POA: Insufficient documentation

## 2012-11-21 LAB — POCT HEMOGLOBIN-HEMACUE: Hemoglobin: 9.8 g/dL — ABNORMAL LOW (ref 13.0–17.0)

## 2012-11-21 MED ORDER — EPOETIN ALFA 20000 UNIT/ML IJ SOLN
INTRAMUSCULAR | Status: AC
  Administered 2012-11-21: 20000 [IU] via SUBCUTANEOUS
  Filled 2012-11-21: qty 1

## 2012-11-21 MED ORDER — EPOETIN ALFA 20000 UNIT/ML IJ SOLN: 20000.0000 [IU] | INTRAMUSCULAR | Status: DC

## 2012-12-01 ENCOUNTER — Other Ambulatory Visit: Payer: Self-pay | Admitting: Family Medicine

## 2012-12-02 ENCOUNTER — Telehealth: Payer: Self-pay | Admitting: *Deleted

## 2012-12-02 MED ORDER — GLUCOSE BLOOD VI STRP: ORAL_STRIP | Status: DC

## 2012-12-05 ENCOUNTER — Encounter (HOSPITAL_COMMUNITY)
Admission: RE | Admit: 2012-12-05 | Discharge: 2012-12-05 | Disposition: A | Discharge status: Home / Self Care | Payer: Medicare Other | Source: Ambulatory Visit | Attending: Nephrology | Admitting: Nephrology

## 2012-12-05 VITALS — BP 144/83 | HR 87 | Temp 98.1°F | Resp 18

## 2012-12-05 DIAGNOSIS — N186 End stage renal disease: Secondary | ICD-10-CM | POA: Insufficient documentation

## 2012-12-05 LAB — POCT HEMOGLOBIN-HEMACUE: Hemoglobin: 10 g/dL — ABNORMAL LOW (ref 13.0–17.0)

## 2012-12-05 MED ORDER — EPOETIN ALFA 20000 UNIT/ML IJ SOLN: 20000.0000 [IU] | INTRAMUSCULAR | Status: DC

## 2012-12-05 MED ORDER — EPOETIN ALFA 20000 UNIT/ML IJ SOLN
INTRAMUSCULAR | Status: AC
  Administered 2012-12-05: 20000 [IU] via SUBCUTANEOUS
  Filled 2012-12-05: qty 1

## 2012-12-08 ENCOUNTER — Encounter: Payer: Self-pay | Admitting: Vascular Surgery

## 2012-12-09 ENCOUNTER — Ambulatory Visit: Payer: Medicare Other | Admitting: Vascular Surgery

## 2012-12-19 ENCOUNTER — Encounter (HOSPITAL_COMMUNITY): Payer: Medicare Other

## 2012-12-23 ENCOUNTER — Encounter (HOSPITAL_COMMUNITY)
Admission: RE | Admit: 2012-12-23 | Discharge: 2012-12-23 | Disposition: A | Discharge status: Home / Self Care | Payer: Medicare Other | Source: Ambulatory Visit | Attending: Nephrology | Admitting: Nephrology

## 2012-12-23 VITALS — BP 161/82 | HR 93 | Resp 18

## 2012-12-23 DIAGNOSIS — N186 End stage renal disease: Secondary | ICD-10-CM

## 2012-12-23 LAB — IRON AND TIBC
Iron: 63 ug/dL (ref 42–135)
UIBC: 161 ug/dL (ref 125–400)

## 2012-12-23 MED ORDER — EPOETIN ALFA 20000 UNIT/ML IJ SOLN
INTRAMUSCULAR | Status: AC
  Administered 2012-12-23: 20000 [IU] via SUBCUTANEOUS
  Filled 2012-12-23: qty 1

## 2012-12-23 MED ORDER — EPOETIN ALFA 20000 UNIT/ML IJ SOLN: 20000.0000 [IU] | INTRAMUSCULAR | Status: DC

## 2012-12-31 ENCOUNTER — Ambulatory Visit: Payer: Medicare Other | Admitting: Dietician

## 2013-01-06 ENCOUNTER — Encounter (HOSPITAL_COMMUNITY): Payer: Medicare Other

## 2013-01-12 ENCOUNTER — Encounter: Payer: Self-pay | Admitting: Vascular Surgery

## 2013-01-13 ENCOUNTER — Encounter (INDEPENDENT_AMBULATORY_CARE_PROVIDER_SITE_OTHER): Payer: Medicare Other | Admitting: *Deleted

## 2013-01-13 ENCOUNTER — Encounter: Payer: Self-pay | Admitting: Vascular Surgery

## 2013-01-13 ENCOUNTER — Ambulatory Visit (INDEPENDENT_AMBULATORY_CARE_PROVIDER_SITE_OTHER): Payer: Medicare Other | Admitting: Vascular Surgery

## 2013-01-13 VITALS — BP 122/73 | HR 83 | Resp 18 | Ht 72.0 in | Wt 307.0 lb

## 2013-01-13 DIAGNOSIS — T82898A Other specified complication of vascular prosthetic devices, implants and grafts, initial encounter: Secondary | ICD-10-CM

## 2013-01-13 DIAGNOSIS — Z4931 Encounter for adequacy testing for hemodialysis: Secondary | ICD-10-CM

## 2013-01-13 DIAGNOSIS — N186 End stage renal disease: Secondary | ICD-10-CM

## 2013-01-13 NOTE — Progress Notes (Signed)
Subjective:     Patient ID: [name], male   DOB: Jan 11, 1956, 57 y.o.   MRN: [id number]  HPI this 57 year old male returns 3 weeks post creation left radial artery to cephalic vein AV fistula for hemodialysis. Patient has chronic renal insufficiency but has not yet started on hemodialysis. He denies any pain or numbness in the left and.   Review of Systems     Objective:   Physical Exam BP 122/73  Pulse 83  Resp 18  Ht 6' (1.829 m)  Wt 307 lb (139.254 kg)  BMI 41.63 kg/m2  General well-developed well-nourished male in no apparent distress alert and oriented x3 Left upper extremity with excellent pulse and palpable thrill and cephalic vein at the antecubital area well-perfused left hand  Today I ordered a duplex scan of the left radial cephalic AV fistula. There is excellent flow in the fistula. There is one area of mild narrowing in the mid forearm but it is not appear to be affecting flow.      Assessment:     Nicely functioning left radiocephalic AV fistula and patient not yet on hemodialysis    Plan:     Okay to use left radiocephalic AV fistula at any time. If laser not good please refer back and we will perform fistulogram

## 2013-01-21 ENCOUNTER — Ambulatory Visit: Payer: Medicare Other | Admitting: Dietician

## 2013-02-09 ENCOUNTER — Other Ambulatory Visit: Payer: Self-pay | Admitting: *Deleted

## 2013-02-09 MED ORDER — ROSUVASTATIN CALCIUM 40 MG PO TABS: 40.0000 mg | ORAL_TABLET | Freq: Every day | ORAL | Status: DC

## 2013-02-09 NOTE — Telephone Encounter (Signed)
Requested Prescriptions   Pending Prescriptions Disp Refills  . rosuvastatin (CRESTOR) 40 MG tablet      Sig: Take 1 tablet (40 mg total) by mouth at bedtime.

## 2013-02-17 ENCOUNTER — Emergency Department (HOSPITAL_COMMUNITY): Payer: Medicare Other

## 2013-02-17 ENCOUNTER — Inpatient Hospital Stay (HOSPITAL_COMMUNITY)
Admission: EM | Admit: 2013-02-17 | Discharge: 2013-02-27 | DRG: 291 | Disposition: A | Discharge status: Home Health | Payer: Medicare Other | Attending: Family Medicine | Admitting: Family Medicine

## 2013-02-17 ENCOUNTER — Encounter (HOSPITAL_COMMUNITY): Payer: Self-pay | Admitting: Emergency Medicine

## 2013-02-17 DIAGNOSIS — N185 Chronic kidney disease, stage 5: Secondary | ICD-10-CM

## 2013-02-17 DIAGNOSIS — F3289 Other specified depressive episodes: Secondary | ICD-10-CM | POA: Diagnosis present

## 2013-02-17 DIAGNOSIS — I120 Hypertensive chronic kidney disease with stage 5 chronic kidney disease or end stage renal disease: Secondary | ICD-10-CM | POA: Diagnosis present

## 2013-02-17 DIAGNOSIS — J441 Chronic obstructive pulmonary disease with (acute) exacerbation: Secondary | ICD-10-CM | POA: Diagnosis present

## 2013-02-17 DIAGNOSIS — Z6839 Body mass index (BMI) 39.0-39.9, adult: Secondary | ICD-10-CM

## 2013-02-17 DIAGNOSIS — Z823 Family history of stroke: Secondary | ICD-10-CM

## 2013-02-17 DIAGNOSIS — G4733 Obstructive sleep apnea (adult) (pediatric): Secondary | ICD-10-CM | POA: Diagnosis present

## 2013-02-17 DIAGNOSIS — F329 Major depressive disorder, single episode, unspecified: Secondary | ICD-10-CM | POA: Diagnosis present

## 2013-02-17 DIAGNOSIS — Z833 Family history of diabetes mellitus: Secondary | ICD-10-CM

## 2013-02-17 DIAGNOSIS — Z9861 Coronary angioplasty status: Secondary | ICD-10-CM

## 2013-02-17 DIAGNOSIS — D631 Anemia in chronic kidney disease: Secondary | ICD-10-CM | POA: Diagnosis present

## 2013-02-17 DIAGNOSIS — Z8042 Family history of malignant neoplasm of prostate: Secondary | ICD-10-CM

## 2013-02-17 DIAGNOSIS — Z9119 Patient's noncompliance with other medical treatment and regimen: Secondary | ICD-10-CM

## 2013-02-17 DIAGNOSIS — N179 Acute kidney failure, unspecified: Secondary | ICD-10-CM | POA: Diagnosis not present

## 2013-02-17 DIAGNOSIS — I5043 Acute on chronic combined systolic (congestive) and diastolic (congestive) heart failure: Principal | ICD-10-CM | POA: Diagnosis present

## 2013-02-17 DIAGNOSIS — J449 Chronic obstructive pulmonary disease, unspecified: Secondary | ICD-10-CM

## 2013-02-17 DIAGNOSIS — E118 Type 2 diabetes mellitus with unspecified complications: Secondary | ICD-10-CM | POA: Diagnosis present

## 2013-02-17 DIAGNOSIS — Z8249 Family history of ischemic heart disease and other diseases of the circulatory system: Secondary | ICD-10-CM

## 2013-02-17 DIAGNOSIS — I252 Old myocardial infarction: Secondary | ICD-10-CM

## 2013-02-17 DIAGNOSIS — Z91199 Patient's noncompliance with other medical treatment and regimen due to unspecified reason: Secondary | ICD-10-CM

## 2013-02-17 DIAGNOSIS — I10 Essential (primary) hypertension: Secondary | ICD-10-CM | POA: Diagnosis present

## 2013-02-17 DIAGNOSIS — M129 Arthropathy, unspecified: Secondary | ICD-10-CM | POA: Diagnosis present

## 2013-02-17 DIAGNOSIS — N039 Chronic nephritic syndrome with unspecified morphologic changes: Secondary | ICD-10-CM | POA: Diagnosis present

## 2013-02-17 DIAGNOSIS — I2510 Atherosclerotic heart disease of native coronary artery without angina pectoris: Secondary | ICD-10-CM | POA: Diagnosis present

## 2013-02-17 DIAGNOSIS — Z825 Family history of asthma and other chronic lower respiratory diseases: Secondary | ICD-10-CM

## 2013-02-17 DIAGNOSIS — N2581 Secondary hyperparathyroidism of renal origin: Secondary | ICD-10-CM | POA: Diagnosis present

## 2013-02-17 DIAGNOSIS — Z87891 Personal history of nicotine dependence: Secondary | ICD-10-CM

## 2013-02-17 DIAGNOSIS — Z8673 Personal history of transient ischemic attack (TIA), and cerebral infarction without residual deficits: Secondary | ICD-10-CM

## 2013-02-17 DIAGNOSIS — F411 Generalized anxiety disorder: Secondary | ICD-10-CM | POA: Diagnosis present

## 2013-02-17 DIAGNOSIS — N186 End stage renal disease: Secondary | ICD-10-CM | POA: Diagnosis not present

## 2013-02-17 DIAGNOSIS — E1149 Type 2 diabetes mellitus with other diabetic neurological complication: Secondary | ICD-10-CM | POA: Diagnosis present

## 2013-02-17 DIAGNOSIS — I509 Heart failure, unspecified: Secondary | ICD-10-CM

## 2013-02-17 DIAGNOSIS — I5042 Chronic combined systolic (congestive) and diastolic (congestive) heart failure: Secondary | ICD-10-CM

## 2013-02-17 DIAGNOSIS — E875 Hyperkalemia: Secondary | ICD-10-CM | POA: Diagnosis present

## 2013-02-17 DIAGNOSIS — E1142 Type 2 diabetes mellitus with diabetic polyneuropathy: Secondary | ICD-10-CM | POA: Diagnosis present

## 2013-02-17 DIAGNOSIS — E785 Hyperlipidemia, unspecified: Secondary | ICD-10-CM | POA: Diagnosis present

## 2013-02-17 LAB — BASIC METABOLIC PANEL WITH GFR
CO2: 16 meq/L — ABNORMAL LOW (ref 19–32)
Chloride: 107 meq/L (ref 96–112)
Creatinine, Ser: 5.75 mg/dL — ABNORMAL HIGH (ref 0.50–1.35)
GFR calc Af Amer: 12 mL/min — ABNORMAL LOW (ref >90)
Potassium: 5.1 meq/L (ref 3.5–5.1)

## 2013-02-17 LAB — CREATININE, SERUM: GFR calc non Af Amer: 9 mL/min — ABNORMAL LOW (ref >90)

## 2013-02-17 LAB — CBC
HCT: 29.5 % — ABNORMAL LOW (ref 39.0–52.0)
HCT: 31.1 % — ABNORMAL LOW (ref 39.0–52.0)
Hemoglobin: 9.3 g/dL — ABNORMAL LOW (ref 13.0–17.0)
MCH: 27 pg (ref 26.0–34.0)
MCH: 26.9 pg (ref 26.0–34.0)
MCHC: 31.5 g/dL (ref 30.0–36.0)
MCHC: 31.5 g/dL (ref 30.0–36.0)
MCV: 85.5 fL (ref 78.0–100.0)
MCV: 85.4 fL (ref 78.0–100.0)
Platelets: 197 K/uL (ref 150–400)
RBC: 3.45 MIL/uL — ABNORMAL LOW (ref 4.22–5.81)
RDW: 14.6 % (ref 11.5–15.5)
RDW: 14.4 % (ref 11.5–15.5)
WBC: 7.4 K/uL (ref 4.0–10.5)

## 2013-02-17 LAB — GLUCOSE, CAPILLARY
Glucose-Capillary: 166 mg/dL — ABNORMAL HIGH (ref 70–99)
Glucose-Capillary: 248 mg/dL — ABNORMAL HIGH (ref 70–99)

## 2013-02-17 LAB — BASIC METABOLIC PANEL
BUN: 69 mg/dL — ABNORMAL HIGH (ref 6–23)
Calcium: 9.1 mg/dL (ref 8.4–10.5)
GFR calc non Af Amer: 10 mL/min — ABNORMAL LOW (ref >90)
Glucose, Bld: 254 mg/dL — ABNORMAL HIGH (ref 70–99)
Sodium: 136 mEq/L (ref 135–145)

## 2013-02-17 LAB — PRO B NATRIURETIC PEPTIDE: Pro B Natriuretic peptide (BNP): 3029 pg/mL — ABNORMAL HIGH (ref 0–125)

## 2013-02-17 MED ORDER — ATORVASTATIN CALCIUM 80 MG PO TABS
80.0000 mg | ORAL_TABLET | Freq: Every day | ORAL | Status: DC
  Administered 2013-02-18 – 2013-02-27 (×10): 80 mg via ORAL
  Filled 2013-02-17 (×10): qty 1

## 2013-02-17 MED ORDER — POLYETHYLENE GLYCOL 3350 17 G PO PACK
17.0000 g | PACK | Freq: Every day | ORAL | Status: DC | PRN
  Filled 2013-02-17: qty 1

## 2013-02-17 MED ORDER — ACETAMINOPHEN 325 MG PO TABS
650.0000 mg | ORAL_TABLET | ORAL | Status: DC | PRN
  Administered 2013-02-19 – 2013-02-25 (×2): 650 mg via ORAL
  Filled 2013-02-17 (×2): qty 2

## 2013-02-17 MED ORDER — INSULIN ASPART 100 UNIT/ML ~~LOC~~ SOLN
0.0000 [IU] | Freq: Three times a day (TID) | SUBCUTANEOUS | Status: DC
  Administered 2013-02-18: 11 [IU] via SUBCUTANEOUS
  Administered 2013-02-18: 15 [IU] via SUBCUTANEOUS
  Administered 2013-02-18: 8 [IU] via SUBCUTANEOUS
  Administered 2013-02-19: 15 [IU] via SUBCUTANEOUS
  Administered 2013-02-19: 8 [IU] via SUBCUTANEOUS
  Administered 2013-02-19: 5 [IU] via SUBCUTANEOUS
  Administered 2013-02-20: 8 [IU] via SUBCUTANEOUS
  Administered 2013-02-20: 15 [IU] via SUBCUTANEOUS
  Administered 2013-02-21: 3 [IU] via SUBCUTANEOUS
  Administered 2013-02-21: 5 [IU] via SUBCUTANEOUS
  Administered 2013-02-21: 11 [IU] via SUBCUTANEOUS
  Administered 2013-02-22 (×2): 8 [IU] via SUBCUTANEOUS
  Administered 2013-02-22: 5 [IU] via SUBCUTANEOUS

## 2013-02-17 MED ORDER — INSULIN DETEMIR 100 UNIT/ML ~~LOC~~ SOLN
30.0000 [IU] | Freq: Two times a day (BID) | SUBCUTANEOUS | Status: DC
  Administered 2013-02-17 – 2013-02-18 (×2): 30 [IU] via SUBCUTANEOUS
  Filled 2013-02-17 (×3): qty 0.3

## 2013-02-17 MED ORDER — ISOSORBIDE MONONITRATE ER 60 MG PO TB24
60.0000 mg | ORAL_TABLET | Freq: Every day | ORAL | Status: DC
  Administered 2013-02-17 – 2013-02-27 (×11): 60 mg via ORAL
  Filled 2013-02-17 (×11): qty 1

## 2013-02-17 MED ORDER — CALCITRIOL 0.25 MCG PO CAPS
0.2500 ug | ORAL_CAPSULE | ORAL | Status: DC
  Administered 2013-02-17 – 2013-02-19 (×2): 0.25 ug via ORAL
  Filled 2013-02-17 (×2): qty 1

## 2013-02-17 MED ORDER — FUROSEMIDE 10 MG/ML IJ SOLN
160.0000 mg | Freq: Four times a day (QID) | INTRAVENOUS | Status: DC
  Administered 2013-02-17 – 2013-02-19 (×8): 160 mg via INTRAVENOUS
  Filled 2013-02-17 (×12): qty 16

## 2013-02-17 MED ORDER — CLOPIDOGREL BISULFATE 75 MG PO TABS
75.0000 mg | ORAL_TABLET | Freq: Every day | ORAL | Status: DC
  Administered 2013-02-17 – 2013-02-27 (×11): 75 mg via ORAL
  Filled 2013-02-17 (×11): qty 1

## 2013-02-17 MED ORDER — CARVEDILOL 12.5 MG PO TABS
12.5000 mg | ORAL_TABLET | Freq: Two times a day (BID) | ORAL | Status: DC
  Administered 2013-02-18 – 2013-02-27 (×19): 12.5 mg via ORAL
  Filled 2013-02-17 (×22): qty 1

## 2013-02-17 MED ORDER — DARBEPOETIN ALFA-POLYSORBATE 100 MCG/0.5ML IJ SOLN
100.0000 ug | INTRAMUSCULAR | Status: DC
  Administered 2013-02-18: 100 ug via SUBCUTANEOUS
  Filled 2013-02-17 (×3): qty 0.5

## 2013-02-17 MED ORDER — NITROGLYCERIN 0.4 MG SL SUBL
0.4000 mg | SUBLINGUAL_TABLET | SUBLINGUAL | Status: AC
  Administered 2013-02-17 (×2): 0.4 mg via SUBLINGUAL
  Filled 2013-02-17: qty 25

## 2013-02-17 MED ORDER — CALCITRIOL 0.25 MCG PO CAPS: 0.2500 ug | ORAL_CAPSULE | Freq: Every day | ORAL | Status: DC

## 2013-02-17 MED ORDER — CALCITRIOL 0.5 MCG PO CAPS
0.5000 ug | ORAL_CAPSULE | ORAL | Status: DC
  Administered 2013-02-18: 0.5 ug via ORAL
  Filled 2013-02-17 (×2): qty 1

## 2013-02-17 MED ORDER — METOLAZONE 5 MG PO TABS
5.0000 mg | ORAL_TABLET | Freq: Every day | ORAL | Status: DC
  Administered 2013-02-17 – 2013-02-23 (×7): 5 mg via ORAL
  Filled 2013-02-17 (×8): qty 1

## 2013-02-17 MED ORDER — DEXTROSE 5 % IV SOLN
160.0000 mg | Freq: Once | INTRAVENOUS | Status: AC
  Administered 2013-02-17: 160 mg via INTRAVENOUS
  Filled 2013-02-17: qty 16

## 2013-02-17 MED ORDER — SODIUM CHLORIDE 0.9 % IJ SOLN: 3.0000 mL | INTRAMUSCULAR | Status: DC | PRN

## 2013-02-17 MED ORDER — ALBUTEROL SULFATE HFA 108 (90 BASE) MCG/ACT IN AERS
2.0000 | INHALATION_SPRAY | Freq: Two times a day (BID) | RESPIRATORY_TRACT | Status: DC
  Administered 2013-02-18 – 2013-02-26 (×16): 2 via RESPIRATORY_TRACT

## 2013-02-17 MED ORDER — ALBUTEROL SULFATE HFA 108 (90 BASE) MCG/ACT IN AERS
2.0000 | INHALATION_SPRAY | RESPIRATORY_TRACT | Status: DC
  Administered 2013-02-17: 2 via RESPIRATORY_TRACT
  Filled 2013-02-17: qty 6.7

## 2013-02-17 MED ORDER — TIOTROPIUM BROMIDE MONOHYDRATE 18 MCG IN CAPS
18.0000 ug | ORAL_CAPSULE | Freq: Every day | RESPIRATORY_TRACT | Status: DC
  Administered 2013-02-17 – 2013-02-26 (×9): 18 ug via RESPIRATORY_TRACT
  Filled 2013-02-17 (×2): qty 5

## 2013-02-17 MED ORDER — FUROSEMIDE 10 MG/ML IJ SOLN
100.0000 mg | Freq: Three times a day (TID) | INTRAVENOUS | Status: DC
  Filled 2013-02-17: qty 10

## 2013-02-17 MED ORDER — SODIUM CHLORIDE 0.9 % IV SOLN: 250.0000 mL | INTRAVENOUS | Status: DC | PRN

## 2013-02-17 MED ORDER — PREDNISONE 20 MG PO TABS
40.0000 mg | ORAL_TABLET | Freq: Every day | ORAL | Status: AC
  Administered 2013-02-17 – 2013-02-21 (×5): 40 mg via ORAL
  Filled 2013-02-17 (×6): qty 2

## 2013-02-17 MED ORDER — SODIUM CHLORIDE 0.9 % IJ SOLN
3.0000 mL | Freq: Two times a day (BID) | INTRAMUSCULAR | Status: DC
  Administered 2013-02-17 – 2013-02-27 (×19): 3 mL via INTRAVENOUS

## 2013-02-17 MED ORDER — ONDANSETRON HCL 4 MG/2ML IJ SOLN: 4.0000 mg | Freq: Four times a day (QID) | INTRAMUSCULAR | Status: DC | PRN

## 2013-02-17 MED ORDER — HYDRALAZINE HCL 50 MG PO TABS
50.0000 mg | ORAL_TABLET | Freq: Three times a day (TID) | ORAL | Status: DC
  Administered 2013-02-17 – 2013-02-27 (×29): 50 mg via ORAL
  Filled 2013-02-17 (×32): qty 1

## 2013-02-17 MED ORDER — HEPARIN SODIUM (PORCINE) 5000 UNIT/ML IJ SOLN
5000.0000 [IU] | Freq: Three times a day (TID) | INTRAMUSCULAR | Status: DC
  Administered 2013-02-17 – 2013-02-27 (×28): 5000 [IU] via SUBCUTANEOUS
  Filled 2013-02-17 (×32): qty 1

## 2013-02-17 NOTE — ED Notes (Signed)
Admitting MD at bedside.

## 2013-02-17 NOTE — H&P (Signed)
[facility] Admission History and Physical Service Pager: [phone]  Patient name: Jonathon Bailey Medical record number: [id number] Date of birth: 1955/10/07 Age: 57 y.o. Gender: male  Primary Care Provider: Thersa Salt, DO  Chief Complaint: fluid overload  Assessment and Plan: Jonathon Bailey is a 57 y.o. year old male with known CHF presenting with an acute CHF exacerbation, likely secondary to non-compliance with home lasix. No evidence of fluid overload on CXR, but does clinically appear fluid overloaded and has elevated pro BNP. Respiratory symptoms may also be due to COPD exacerbation given prominent wheezing on exam.   # CHF exacerbation: BNP elevated. Last echo was January 2014 and noted EF 40-45%, moderate hypokinesis of lateral myocardium, grade 2 diastolic dysfunction. Dry weight per pt is 295lb. - admit to telemetry - cycle cardiac enzymes, and EKG am - diurese with IV lasix 100mg  TID - strict I&O - daily weights - consider repeating echo (last echo Jan 2014) - consider ultrasound of abdomen to evaluate for ascites (last u/s in Nov 2013 and did not show ascites)  # CKD: has fistula placed but not on dialysis. Baseline creatinine unclear, was 6-7 in January but that was in setting of acute CHF exacerbation. Prior to that admission was 3-4. - renal consulted on admission  # Hypertension: - continue home hydralazine, imdur, and carvedilol as above. - decrease carvedilol dose to 12.5mg  BID in setting of acute CHF exacerbation with hx of CAD  # COPD:  - continue home spiriva - schedule albuterol 2 puff q4h for wheezing - prednisone 40mg  daily for possible exacerbation - monitor for improvement, consider antibiotics  # Diabetes: takes levemir 75 BID at home.  - continue levemir at decreased dose with moderate SSI for now  # CAD s/p MI with stents: - continue plavix, imdur  # Sleep apnea: - CPAP while hospitalized  # HLD: continue  statin  # FEN/GI: - carb modified diet  # Prophylaxis: - heparin SQ  # Dispo:  - pending clinical improvement and adequate diuresis  # Code Status: discussed with patient, he desires to be full code.  History of Present Illness: Jonathon Bailey is a 57 y.o. year old male presenting with a CHF exacerbation.  Patient is supposed to be taking lasix 160mg  PO BID at home, but says he has not been taking it as prescribed because he thinks he urinates better when he doesn't take it. About one month ago, he began taking it hit or miss. Prior to that he took it regularly. The last time he took it was Sunday. He has had marked dyspnea on exertion. He sleeps better sitting up. Denies chest pain. His dry weight is 295 lb. The last time he was at that weight was when he was discharged from the hospital (around the first of the year). He does feel like his abdomen is full of fluid. He has to use albuterol every day. Has noted swelling in his legs.  He does get lightheaded when he stands up. Last bowel movement was today, patient had to strain to have the bowel movement. No dysuria. Has coughed up phlegm for about 1.5 months, comes and goes. No fevers. Uses a CPAP at home.  Has a hx of stage 5 CKD. Has a fistula placed in his left wrist but is not on dialysis. The fistula was placed in January of 2014. Sees Dr. Jimmy Footman every 1-2 months. His cardiologist is Dr. Claiborne Billings.  Also has a hx of  CVA x2. Per patient, his only lasting deficits are that he becomes emotional easily, and will cry during routine conversations.   Review Of Systems: Per HPI. Otherwise 12 point review of systems was performed and was unremarkable.  Patient Active Problem List   Diagnosis Date Noted  . Other complications due to renal dialysis device, implant, and graft 10/24/2012  . End stage renal disease 10/24/2012  . COPD (chronic obstructive pulmonary disease) 10/23/2012  . Arm edema 10/23/2012  . DIABETIC PERIPHERAL  NEUROPATHY 01/09/2010  . CKD (chronic kidney disease) stage 5, GFR less than 15 ml/min 08/30/2009  . CEREBROVASCULAR ACCIDENT, HX OF 08/30/2009  . Diabetes mellitus type 2 with complications [id number]  . HYPERLIPIDEMIA 09/21/2008  . OBSTRUCTIVE SLEEP APNEA 09/21/2008  . ESSENTIAL HYPERTENSION 09/21/2008  . CORONARY ARTERY DISEASE 09/21/2008  . Chronic combined systolic and diastolic heart failure Q000111Q    Past Medical History: Past Medical History  Diagnosis Date  . Diabetes mellitus   . Hyperlipidemia   . Hypertension   . Myocardial infarction     status post MI x2 and 3 stents placed in 2003  . Stroke ~ 2007; ~1987    "weak on right side; messed w/right side of brain; cry all the time"  . Sleep apnea     "sleep w/CPAP sometimes"  . Arthritis   . Anxiety   . Depression   . Chronic systolic heart failure Q000111Q    ECHO Feb 2013 showed LVEF low normal at 50-55%, +hypokinetic anterolateral wall and inferolateral wall.    . CKD (chronic kidney disease) stage 4, GFR 15-29 ml/min 08/30/2009    Progressive renal failure since 2008, creatinine 1.2 in 2008 up to 3.5 in 2012 and 3.2-5.0 in 2013. All UA's 2011-13 showed >300 protein on dipstick. Work-up in May 2011 showed negative Urine IFE and SPEP, ultrasound showed 12-13 cm kidneys with increased echogenicity and UPC ratio was 1.5 gm proteinuria.  Hgb A1C's from 2011 to 2013 were all between 9-11.  Patient saw Dr. Donnetta Hutching (vasc surgery) for HD access in Aug 2013 > vein mapping was done and Dr. Donnetta Hutching felt the left arm (pt is R handed) was suitable for L arm Cimino radiocephalic fistula. Patient said he wasn't ready to consider doing dialysis and declined the surgery.      Past Surgical History: Past Surgical History  Procedure Laterality Date  . Coronary stent placement    . Hernia repair      umbilical  . Coronary angioplasty with stent placement  ~ 2002    "3"  . Av fistula placement  10/17/2012    Procedure: ARTERIOVENOUS  (AV) FISTULA CREATION;  Surgeon: Mal Misty, MD;  Location: [facility] OR;  Service: Vascular;  Laterality: Left;    Home Medications:  No current facility-administered medications on file prior to encounter.   Current Outpatient Prescriptions on File Prior to Encounter  Medication Sig Dispense Refill  . albuterol (PROVENTIL HFA;VENTOLIN HFA) 108 (90 BASE) MCG/ACT inhaler Inhale 2 puffs into the lungs every 4 (four) hours as needed. For shortness of breath and wheezing  1 Inhaler  2  . Blood Glucose Monitoring Suppl (ACCU-CHEK AVIVA PLUS) W/DEVICE KIT USE TO CHECK BLOOD SUGAR 6 TIMES DAILY  1 kit  0  . calcitRIOL (ROCALTROL) 0.25 MCG capsule Take 0.25-0.5 mcg by mouth daily. Alternates takes 1 tablet and 2 capsules the next day      . carvedilol (COREG) 25 MG tablet Take 37.5 mg by mouth 2 (two) times daily  with a meal.      . clopidogrel (PLAVIX) 75 MG tablet Take 1 tablet (75 mg total) by mouth daily.  30 tablet  5  . fluticasone (FLONASE) 50 MCG/ACT nasal spray Place 2 sprays into the nose daily.  16 g  0  . furosemide (LASIX) 80 MG tablet Take 2 tablets (160 mg total) by mouth 3 (three) times daily.  180 tablet  3  . glucose blood (ACCU-CHEK AVIVA PLUS) test strip Use to check blood sugars six times a day.  DX: 250.02  100 each  12  . hydrALAZINE (APRESOLINE) 50 MG tablet Take 1 tablet (50 mg total) by mouth 3 (three) times daily.  90 tablet  3  . insulin detemir (LEVEMIR) 100 UNIT/ML injection Inject 75 Units into the skin 2 (two) times daily. Please give pen if covered. 3 month supply.  10 mL  12  . Insulin Syringe-Needle U-100 25G X 1" 1 ML MISC 1 applicator by Does not apply route 2 (two) times daily. Please give needles 4 Levemir.  200 each  0  . isosorbide mononitrate (IMDUR) 60 MG 24 hr tablet Take 60 mg by mouth daily.      . potassium chloride SA (K-DUR,KLOR-CON) 20 MEQ tablet Take 1 tablet (20 mEq total) by mouth daily.  30 tablet  3  . rosuvastatin (CRESTOR) 40 MG tablet Take 1  tablet (40 mg total) by mouth at bedtime.  30 tablet  6  . tiotropium (SPIRIVA HANDIHALER) 18 MCG inhalation capsule Place 1 capsule (18 mcg total) into inhaler and inhale daily.  30 capsule  3     Social History: History  Substance Use Topics  . Smoking status: Former Smoker -- 1.00 packs/day for 15 years    Types: Cigarettes    Quit date: 09/24/1986  . Smokeless tobacco: Never Used  . Alcohol Use: 1.2 oz/week    2 Shots of liquor per week   For any additional social history documentation, please refer to relevant sections of EMR.  Family History: Family History  Problem Relation Age of Onset  . Asthma Mother   . Hyperlipidemia Mother   . Hypertension Mother   . Stroke Father   . Heart attack Father   . Prostate cancer Father   . Deep vein thrombosis Father   . Cancer Father   . Diabetes Father   . Hyperlipidemia Father   . Hypertension Father   . Other Father     varicose veins  . Heart disease Father     before age 1  . Other Sister     varicose veins   Allergies: Allergies  Allergen Reactions  . Feraheme (Ferumoxytol) Itching    Severe itching.    Physical Exam: BP 172/90  Pulse 86  Temp(Src) 97.9 F (36.6 C) (Oral)  Resp 17  Ht 6' (1.829 m)  Wt 320 lb 12.8 oz (145.514 kg)  BMI 43.5 kg/m2  SpO2 98% Exam: General: NAD. Mildly increased work of breathing. Very pleasant and talkative. HEENT: moist mucous membranes. Normocephalic, atraumatic.  Cardiovascular: RRR Respiratory: diffuse wheezes throughout all lung fields. Some diminished air movement. Coarse breath sounds throughout. Abdomen: nontender to palpation but moderately distended. Positive fluid wave. Extremities: 3+ pitting edema in bilateral lower extremities. Calves nonerythematous, nontender to palpation. Skin: no rashes noted Neuro: grossly nonfocal. Speech intact. Back: no CVA tenderness. Psych: intermittently tearful at times, but generally pleasant with great sense of humor  Labs and  Imaging:  CBC  Recent Labs  Lab 02/17/13 1226  WBC 7.4  HGB 9.3*  HCT 29.5*  PLT 197     BMET  Recent Labs Lab 02/17/13 1226  NA 136  K 5.1  CL 107  CO2 16*  BUN 69*  CREATININE 5.75*  GLUCOSE 254*  CALCIUM 9.1      BNP 3029  CXR 5/27: Findings: Stable cardiomegaly with chronic vascular and interstitial changes diffusely. No definite superimposed pneumonia, CHF, effusion or pneumothorax. Trachea midline. No significant interval change. IMPRESSION: Cardiomegaly with chronic vascular and interstitial prominence.  Chrisandra Netters, MD Family Medicine PGY-1  Teaching Service Addendum. I have seen and evaluated this pt and agree with Dr. Ardelia Mems assessment and plan documented on this note.   D. [place]oto Philippa Sicks, MD Family Medicine  PGY-2

## 2013-02-17 NOTE — ED Provider Notes (Signed)
Medical screening examination/treatment/procedure(s) were performed by non-physician practitioner and as supervising physician I was immediately available for consultation/collaboration.   Saddie Benders. Jahmari Esbenshade, MD 02/17/13 1523

## 2013-02-17 NOTE — ED Notes (Signed)
C/o worsening BLE edema, SOB with minimal exertion x 1 month. Admits to not taking his lasix, "I pee less when I take them". Denies CP, fever. +DOE, +orthopnea, sleeps with 4 pillows.

## 2013-02-17 NOTE — ED Notes (Addendum)
CHF, recurrent SOB. Patient states" i've got fluid building up". Patient has not taken daily medications. NO CP Patient states that he cannot lay down to sleep "feels like I'm drowning"

## 2013-02-17 NOTE — Consult Note (Signed)
[facility] Renal Consultation Note  Requesting MD: Family Medicine Teaching service Indication for Consultation: Advanced CKD and volume overload  HPI:  Jonathon Bailey is a 57 y.o. male with past medical history significant for obesity, hypertension, coronary artery disease as well as cerebrovascular disease and type 2 diabetes mellitus. He also has advanced chronic kidney disease followed by Dr. Jimmy Footman. He last saw Dr. Jimmy Footman in the office on April 14 of this year. At that time his weight was 308 pounds and Dr. Jimmy Footman noted that patient was volume overloaded. He treated him with a short course of metolazone along with the Lasix he was supposed to be taking at 160 mg 3 times a day. He reports that he did take that course only for about 5-7 days. And then he began to gradually put the weight back on. He tells me he's only taking Lasix 160 mg twice a day. He weighed in the emergency room at 320 pounds which is 12 pounds over where he was a month ago. He presented with shortness of breath. He generally has pulmonary edema and also some wheezing noted on exam. He is being admitted by the family medicine teaching service and the plan is to give him IV diuretics as well as put him on appropriate pulmonary medications for a known history of COPD. Patient states he has not had any appetite disturbance nausea or vomiting. She does admit to fatigue but he relates that mainly to his shortness of breath and orthopnea. Patient does have a left lower arm AV fistula in place. Dr. Jimmy Footman hand doubt about this access. Patient recently saw Dr. Chrystal Zeimet Simmering who felt that it would be adequate to support dialysis therapy.  Currently patient is in no acute distress and not requiring any oxygen. He has been given one dose of 100 mg of Lasix IV. He seems to be producing adequate urine with that.  Creat  Date/Time Value Range Status  10/23/2012  3:54 PM 7.17* 0.50 - 1.35 mg/dL Final     Result repeated  and verified.  08/25/2012  2:14 PM 5.00* 0.50 - 1.35 mg/dL Final     Result repeated and verified.  05/23/2012  2:19 PM 3.85* 0.50 - 1.35 mg/dL Final  11/09/2011  4:21 PM 3.53* 0.50 - 1.35 mg/dL Final  11/07/2011 12:14 PM 3.37* 0.50 - 1.35 mg/dL Final  04/05/2011  3:19 PM 3.69* 0.50 - 1.35 mg/dL Final     Result repeated and verified.  11/08/2010  9:44 AM 5.23* 0.40 - 1.50 mg/dL Final     Result repeated and verified.     Creatinine, Ser  Date/Time Value Range Status  02/17/2013 12:26 PM 5.75* 0.50 - 1.35 mg/dL Final  10/18/2012  5:00 AM 7.27* 0.50 - 1.35 mg/dL Final  10/17/2012  5:00 AM 6.76* 0.50 - 1.35 mg/dL Final  10/16/2012  5:53 AM 6.74* 0.50 - 1.35 mg/dL Final  10/15/2012  6:04 AM 6.85* 0.50 - 1.35 mg/dL Final  10/14/2012  5:00 AM 6.95* 0.50 - 1.35 mg/dL Final  10/13/2012  6:10 AM 7.13* 0.50 - 1.35 mg/dL Final  10/12/2012  4:44 AM 6.16* 0.50 - 1.35 mg/dL Final  10/11/2012 12:56 PM 5.71* 0.50 - 1.35 mg/dL Final  08/13/2012  8:52 AM 4.89* 0.50 - 1.35 mg/dL Final  08/12/2012  5:05 AM 4.40* 0.50 - 1.35 mg/dL Final  08/11/2012  3:41 PM 4.19* 0.50 - 1.35 mg/dL Final  11/14/2011  5:50 AM 3.25* 0.50 - 1.35 mg/dL Final  11/13/2011  8:48 AM 3.17*  0.50 - 1.35 mg/dL Final  11/13/2011 12:30 AM 3.37* 0.50 - 1.35 mg/dL Final  08/30/2010  7:37 PM 2.63* 0.40-1.50 mg/dL Final     See lab report for associated comment(s)  08/14/2010  8:32 PM 2.86* 0.40-1.50 mg/dL Final     See lab report for associated comment(s)  08/09/2010  6:51 PM 2.07* 0.40-1.50 mg/dL Final     See lab report for associated comment(s)  06/30/2010  6:32 PM 2.16* 0.40-1.50 mg/dL Final     See lab report for associated comment(s)  06/26/2010  7:55 PM 3.37* 0.40-1.50 mg/dL Final     See lab report for associated comment(s)  02/16/2010  4:40 AM 1.98* 0.4 - 1.5 mg/dL Final  02/15/2010  4:20 AM 2.12* 0.4 - 1.5 mg/dL Final  02/14/2010  3:40 AM 2.01* 0.4 - 1.5 mg/dL Final  02/13/2010  3:45 AM 2.14* 0.4 - 1.5 mg/dL Final  02/12/2010  3:55 AM 2.41*  0.4 - 1.5 mg/dL Final  02/11/2010  4:00 AM 2.85* 0.4 - 1.5 mg/dL Final  02/10/2010  4:04 AM 2.70* 0.4 - 1.5 mg/dL Final  02/09/2010  4:15 AM 2.74* 0.4 - 1.5 mg/dL Final  02/08/2010  4:20 AM 2.28* 0.4 - 1.5 mg/dL Final  02/07/2010  6:28 PM 2.3* 0.4 - 1.5 mg/dL Final  01/16/2010 10:37 AM 1.51* 0.4 - 1.5 mg/dL Final  01/09/2010  9:13 PM 2.01* 0.40-1.50 mg/dL Final     See lab report for associated comment(s)  11/11/2009  9:57 PM 1.63* 0.40-1.50 mg/dL Final  11/07/2009  6:29 PM 1.68* 0.40-1.50 mg/dL Final  11/03/2009  6:19 PM 1.59* 0.40-1.50 mg/dL Final  10/31/2009  6:21 PM 2.00* 0.40-1.50 mg/dL Final  10/28/2009  6:42 PM 1.59* 0.40-1.50 mg/dL Final  08/30/2009  6:31 PM 1.97* 0.40-1.50 mg/dL Final  08/02/2009  7:54 PM 1.85* 0.40-1.50 mg/dL Final  07/11/2009  8:12 PM 1.74* 0.40-1.50 mg/dL Final  06/07/2009 10:50 PM 1.40  0.40-1.50 mg/dL Final  05/10/2009  8:28 PM 1.97* 0.40-1.50 mg/dL Final  04/15/2009 10:15 AM 1.50  0.4 - 1.5 mg/dL Final  04/14/2009  5:12 AM 1.44  0.4 - 1.5 mg/dL Final  04/13/2009  6:20 AM 1.26  0.4 - 1.5 mg/dL Final  04/11/2009  5:48 PM 1.5  0.4 - 1.5 mg/dL Final  09/14/2008  5:00 AM 1.32  0.4 - 1.5 mg/dL Final  09/13/2008  9:02 PM 1.24  0.4 - 1.5 mg/dL Final  09/13/2008  5:23 PM 1.3  0.4 - 1.5 mg/dL Final  01/07/2008  3:21 PM 1.02   Final  10/08/2007  8:50 AM 1.13   Final  10/07/2007  6:40 AM 1.02   Final     PMHx:   Past Medical History  Diagnosis Date  . Diabetes mellitus   . Hyperlipidemia   . Hypertension   . Myocardial infarction     status post MI x2 and 3 stents placed in 2003  . Stroke ~ 2007; ~1987    "weak on right side; messed w/right side of brain; cry all the time"  . Sleep apnea     "sleep w/CPAP sometimes"  . Arthritis   . Anxiety   . Depression   . Chronic systolic heart failure [id number]    ECHO Feb 2013 showed LVEF low normal at 50-55%, +hypokinetic anterolateral wall and inferolateral wall.    . CKD (chronic kidney disease) stage 4, GFR 15-29 ml/min  08/30/2009    Progressive renal failure since 2008, creatinine 1.2 in 2008 up to 3.5 in 2012 and 3.2-5.0 in 2013.  All UA's 2011-13 showed >300 protein on dipstick. Work-up in May 2011 showed negative Urine IFE and SPEP, ultrasound showed 12-13 cm kidneys with increased echogenicity and UPC ratio was 1.5 gm proteinuria.  Hgb A1C's from 2011 to 2013 were all between 9-11.  Patient saw Dr. Donnetta Hutching (vasc surgery) for HD access in Aug 2013 > vein mapping was done and Dr. Donnetta Hutching felt the left arm (pt is R handed) was suitable for L arm Cimino radiocephalic fistula. Patient said he wasn't ready to consider doing dialysis and declined the surgery.       Past Surgical History  Procedure Laterality Date  . Coronary stent placement    . Hernia repair      umbilical  . Coronary angioplasty with stent placement  ~ 2002    "3"  . Av fistula placement  10/17/2012    Procedure: ARTERIOVENOUS (AV) FISTULA CREATION;  Surgeon: Mal Misty, MD;  Location: [facility] OR;  Service: Vascular;  Laterality: Left;    Family Hx:  Family History  Problem Relation Age of Onset  . Asthma Mother   . Hyperlipidemia Mother   . Hypertension Mother   . Stroke Father   . Heart attack Father   . Prostate cancer Father   . Deep vein thrombosis Father   . Cancer Father   . Diabetes Father   . Hyperlipidemia Father   . Hypertension Father   . Other Father     varicose veins  . Heart disease Father     before age 64  . Other Sister     varicose veins    Social History:  reports that he quit smoking about 26 years ago. His smoking use included Cigarettes. He has a 15 pack-year smoking history. He has never used smokeless tobacco. He reports that he drinks about 1.2 ounces of alcohol per week. He reports that he does not use illicit drugs.  Allergies:  Allergies  Allergen Reactions  . Feraheme (Ferumoxytol) Itching    Severe itching.    Medications: Prior to Admission medications   Medication Sig Start Date End Date  Taking? Authorizing Provider  albuterol (PROVENTIL HFA;VENTOLIN HFA) 108 (90 BASE) MCG/ACT inhaler Inhale 2 puffs into the lungs every 4 (four) hours as needed. For shortness of breath and wheezing 05/23/12  Yes Amber Fidel Levy, MD  bisacodyl (DULCOLAX) 5 MG EC tablet Take 5 mg by mouth daily as needed for constipation.   Yes Historical Provider, MD  Blood Glucose Monitoring Suppl (ACCU-CHEK AVIVA PLUS) W/DEVICE KIT USE TO CHECK BLOOD SUGAR 6 TIMES DAILY 12/01/12  Yes [name], DO  calcitRIOL (ROCALTROL) 0.25 MCG capsule Take 0.25-0.5 mcg by mouth daily. Alternates takes 1 tablet and 2 capsules the next day   Yes Historical Provider, MD  carvedilol (COREG) 25 MG tablet Take 37.5 mg by mouth 2 (two) times daily with a meal.   Yes Historical Provider, MD  clopidogrel (PLAVIX) 75 MG tablet Take 1 tablet (75 mg total) by mouth daily. 08/25/12  Yes Amber Fidel Levy, MD  fluticasone (FLONASE) 50 MCG/ACT nasal spray Place 2 sprays into the nose daily. 10/18/12  Yes [name], DO  furosemide (LASIX) 80 MG tablet Take 2 tablets (160 mg total) by mouth 3 (three) times daily. 10/18/12  Yes Jayce G Cook, DO  glucose blood (ACCU-CHEK AVIVA PLUS) test strip Use to check blood sugars six times a day.  DX: 250.02 12/02/12  Yes [name], DO  hydrALAZINE (APRESOLINE) 50 MG  tablet Take 1 tablet (50 mg total) by mouth 3 (three) times daily. 10/18/12  Yes Jayce G Cook, DO  insulin detemir (LEVEMIR) 100 UNIT/ML injection Inject 75 Units into the skin 2 (two) times daily. Please give pen if covered. 3 month supply. 02/21/12 02/20/13 Yes Lyndal Pulley, DO  Insulin Syringe-Needle U-100 25G X 1" 1 ML MISC 1 applicator by Does not apply route 2 (two) times daily. Please give needles 4 Levemir. 02/21/12  Yes Lyndal Pulley, DO  isosorbide mononitrate (IMDUR) 60 MG 24 hr tablet Take 60 mg by mouth daily. 08/15/12  Yes Amber Fidel Levy, MD  potassium chloride SA (K-DUR,KLOR-CON) 20 MEQ tablet Take 1 tablet (20 mEq total) by  mouth daily. 10/18/12  Yes [name], DO  rosuvastatin (CRESTOR) 40 MG tablet Take 1 tablet (40 mg total) by mouth at bedtime. 02/09/13  Yes [name], DO  tiotropium (SPIRIVA HANDIHALER) 18 MCG inhalation capsule Place 1 capsule (18 mcg total) into inhaler and inhale daily. 10/18/12  Yes [name], DO    I have reviewed the patient's current medications.  Labs:  Results for orders placed during the hospital encounter of 02/17/13 (from the past 48 hour(s))  CBC     Status: Abnormal   Collection Time    02/17/13 12:26 PM      Result Value Range   WBC 7.4  4.0 - 10.5 K/uL   RBC 3.45 (*) 4.22 - 5.81 MIL/uL   Hemoglobin 9.3 (*) 13.0 - 17.0 g/dL   HCT 29.5 (*) 39.0 - 52.0 %   MCV 85.5  78.0 - 100.0 fL   MCH 27.0  26.0 - 34.0 pg   MCHC 31.5  30.0 - 36.0 g/dL   RDW 14.6  11.5 - 15.5 %   Platelets 197  150 - 400 K/uL  BASIC METABOLIC PANEL     Status: Abnormal   Collection Time    02/17/13 12:26 PM      Result Value Range   Sodium 136  135 - 145 mEq/L   Potassium 5.1  3.5 - 5.1 mEq/L   Chloride 107  96 - 112 mEq/L   CO2 16 (*) 19 - 32 mEq/L   Glucose, Bld 254 (*) 70 - 99 mg/dL   BUN 69 (*) 6 - 23 mg/dL   Creatinine, Ser 5.75 (*) 0.50 - 1.35 mg/dL   Calcium 9.1  8.4 - 10.5 mg/dL   GFR calc non Af Amer 10 (*) >90 mL/min   GFR calc Af Amer 12 (*) >90 mL/min   Comment:            The eGFR has been calculated     using the CKD EPI equation.     This calculation has not been     validated in all clinical     situations.     eGFR's persistently     <90 mL/min signify     possible Chronic Kidney Disease.  PRO B NATRIURETIC PEPTIDE     Status: Abnormal   Collection Time    02/17/13 12:26 PM      Result Value Range   Pro B Natriuretic peptide (BNP) 3029.0 (*) 0 - 125 pg/mL  TROPONIN I     Status: None   Collection Time    02/17/13  1:34 PM      Result Value Range   Troponin I <0.30  <0.30 ng/mL   Comment:  Due to the release kinetics of cTnI,     a negative  result within the first hours     of the onset of symptoms does not rule out     myocardial infarction with certainty.     If myocardial infarction is still suspected,     repeat the test at appropriate intervals.  POCT I-STAT TROPONIN I     Status: None   Collection Time    02/17/13  1:38 PM      Result Value Range   Troponin i, poc 0.02  0.00 - 0.08 ng/mL   Comment 3            Comment: Due to the release kinetics of cTnI,     a negative result within the first hours     of the onset of symptoms does not rule out     myocardial infarction with certainty.     If myocardial infarction is still suspected,     repeat the test at appropriate intervals.     ROS: His review of systems is mostly notable for shortness of breath, dyspnea on exertion, orthopnea and P. M.D. He also has pitting edema to lower extremities that goes up into the abdominal wall. He denies nausea, vomiting. He does admit to significant fatigue. The remainder of the review systems is negative.   Physical Exam: Filed Vitals:   02/17/13 1700  BP: 170/90  Pulse: 88  Temp:   Resp:      General:  Obese, black male who is pleasant. He is not short of breath with conversation. He is currently "cutting up" with his nurse HEENT: Pupils are equal round reactive to light, extraocular motions are intact, extremities are moist. Neck: Jugular venous distention is difficult to appreciate secondary to body habitus. Heart: Regular rate and rhythm without murmur, gallop, or rub. Lungs: Poor effort. Decreased breath sounds at the bases. Mostly expiratory wheezing. Abdomen: Obese, abdominal wall edema. Soft, nontender, nondistended. Extremities: Chronic appearing diffuse lower extremity pitting edema Skin: Warm and dry Neuro: Patient is alert and oriented x3. He does not appear to be in acute distress. The remainder of his neurologic exam is nonfocal. He has no asterixis. Patient has a left lower arm AV fistula which is palpable  and has a good thrill. It's possible it could be usable for dialysis therapy.  Assessment/Plan: 57 year old black male with many medical issues and also advanced chronic kidney disease with an AV fistula in place. He presents with symptomatic volume overload. 1.Renal- advanced CKD at baseline. His creatinine today is 5.8. This is actually better as it was 6.6 when he saw Dr. Jimmy Footman last month.  He doesn't appear to be having overwhelming uremic symptoms at this time. Therefore, there don't appear to be indications for initiating dialysis therapy. His AV fistula is marginal I think it would need to be tried before it was declared a failure. 2. Hypertension/volume  - patient is volume overloaded. He is 12 pounds over where he was last month and his visit with Dr. Jimmy Footman. According to the notes, patient is not very compliant with his diuretic medications. He tells me that himself and he's only been taking Lasix 160 mg twice a day instead of 3 times a day. I agree with placing him on IV diuretics and I in fact will increase the dose some. I will also add on daily metolazone. His blood pressure is due mainly to volume overload. Coreg to be continued I don't think anything  else will need to be at this time. I suspect his blood pressure will improve his volume status improves. 3. Anemia  - likely mostly related to CK-MB. There are iron stores from April which reveal an iron saturation of 28. It appears he has an iron allergy so will not replete. He was supposed to be receiving Procrit as an outpatient. It will be continued in the form of Aranesp here.  4. Hyperkalemia- as an outpatient he is on 20 mEq a day of potassium. I anticipate his potassium will trend down with diuresis. However, I would not automatically placed him on potassium just yet. 5. Bones- will check PTH and phosphorus here and treat accordingly. He is supposed to be receiving calcitriol 0.25 alternating with 0.5 daily (this has been  ordered).  Thank you for this consult, we will follow with you.   Caniyah Murley A 02/17/2013, 5:55 PM

## 2013-02-17 NOTE — ED Notes (Signed)
Patient transported to X-ray 

## 2013-02-17 NOTE — ED Provider Notes (Signed)
History     CSN: [id number]  Arrival date & time 02/17/13  1200   First MD Initiated Contact with Patient 02/17/13 1307      Chief Complaint  Patient presents with  . Shortness of Breath    (Consider location/radiation/quality/duration/timing/severity/associated sxs/prior treatment) HPI Comments: Pt with hx CHF p/w gradually increasing leg and abdomen swelling, increased SOB, DOE, and orthopnea.  States "I have fluid up to my throat."  Has decreased his furosemide usage because he states "I pee better without it."  Denies fevers, chills, chest pain, abdominal pain.   Cardiologist Dr Claiborne Billings, [facility].  PCP [place].   The history is provided by the patient.    Past Medical History  Diagnosis Date  . Diabetes mellitus   . Hyperlipidemia   . Hypertension   . Myocardial infarction     status post MI x2 and 3 stents placed in 2003  . Stroke ~ 2007; ~1987    "weak on right side; messed w/right side of brain; cry all the time"  . Sleep apnea     "sleep w/CPAP sometimes"  . Arthritis   . Anxiety   . Depression   . Chronic systolic heart failure [id number]    ECHO Feb 2013 showed LVEF low normal at 50-55%, +hypokinetic anterolateral wall and inferolateral wall.    . CKD (chronic kidney disease) stage 4, GFR 15-29 ml/min 08/30/2009    Progressive renal failure since 2008, creatinine 1.2 in 2008 up to 3.5 in 2012 and 3.2-5.0 in 2013. All UA's 2011-13 showed >300 protein on dipstick. Work-up in May 2011 showed negative Urine IFE and SPEP, ultrasound showed 12-13 cm kidneys with increased echogenicity and UPC ratio was 1.5 gm proteinuria.  Hgb A1C's from 2011 to 2013 were all between 9-11.  Patient saw Dr. Donnetta Hutching (vasc surgery) for HD access in Aug 2013 > vein mapping was done and Dr. Donnetta Hutching felt the left arm (pt is R handed) was suitable for L arm Cimino radiocephalic fistula. Patient said he wasn't ready to consider doing dialysis and declined the surgery.       Past Surgical History   Procedure Laterality Date  . Coronary stent placement    . Hernia repair      umbilical  . Coronary angioplasty with stent placement  ~ 2002    "3"  . Av fistula placement  10/17/2012    Procedure: ARTERIOVENOUS (AV) FISTULA CREATION;  Surgeon: Mal Misty, MD;  Location: [facility] OR;  Service: Vascular;  Laterality: Left;    Family History  Problem Relation Age of Onset  . Asthma Mother   . Hyperlipidemia Mother   . Hypertension Mother   . Stroke Father   . Heart attack Father   . Prostate cancer Father   . Deep vein thrombosis Father   . Cancer Father   . Diabetes Father   . Hyperlipidemia Father   . Hypertension Father   . Other Father     varicose veins  . Heart disease Father     before age 80  . Other Sister     varicose veins    History  Substance Use Topics  . Smoking status: Former Smoker -- 1.00 packs/day for 15 years    Types: Cigarettes    Quit date: 09/24/1986  . Smokeless tobacco: Never Used  . Alcohol Use: 1.2 oz/week    2 Shots of liquor per week      Review of Systems  Constitutional: Negative for fever.  HENT: Negative for facial swelling.   Respiratory: Positive for cough and shortness of breath.   Cardiovascular: Negative for chest pain.  Gastrointestinal: Negative for nausea, vomiting, abdominal pain and diarrhea.  Genitourinary: Negative for dysuria, urgency and frequency.  Skin: Negative for color change.  Neurological: Negative for weakness and light-headedness.  Hematological: Does not bruise/bleed easily.    Allergies  Feraheme  Home Medications   Current Outpatient Rx  Name  Route  Sig  Dispense  Refill  . albuterol (PROVENTIL HFA;VENTOLIN HFA) 108 (90 BASE) MCG/ACT inhaler   Inhalation   Inhale 2 puffs into the lungs every 4 (four) hours as needed. For shortness of breath and wheezing   1 Inhaler   2   . Blood Glucose Monitoring Suppl (ACCU-CHEK AVIVA PLUS) W/DEVICE KIT      USE TO CHECK BLOOD SUGAR 6 TIMES DAILY   1  kit   0   . calcitRIOL (ROCALTROL) 0.25 MCG capsule   Oral   Take 0.25-0.5 mcg by mouth daily. Alternates takes 1 tablet and 2 capsules the next day         . carvedilol (COREG) 25 MG tablet   Oral   Take 37.5 mg by mouth 2 (two) times daily with a meal.         . clopidogrel (PLAVIX) 75 MG tablet   Oral   Take 1 tablet (75 mg total) by mouth daily.   30 tablet   5   . docusate sodium 100 MG CAPS   Oral   Take 100 mg by mouth 2 (two) times daily as needed for constipation.   10 capsule   0   . fluticasone (FLONASE) 50 MCG/ACT nasal spray   Nasal   Place 2 sprays into the nose daily.   16 g   0   . furosemide (LASIX) 80 MG tablet   Oral   Take 2 tablets (160 mg total) by mouth 3 (three) times daily.   180 tablet   3   . glucose blood (ACCU-CHEK AVIVA PLUS) test strip      Use to check blood sugars six times a day.  DX: 250.02   100 each   12   . hydrALAZINE (APRESOLINE) 50 MG tablet   Oral   Take 1 tablet (50 mg total) by mouth 3 (three) times daily.   90 tablet   3   . insulin detemir (LEVEMIR) 100 UNIT/ML injection   Subcutaneous   Inject 75 Units into the skin 2 (two) times daily. Please give pen if covered. 3 month supply.   10 mL   12   . Insulin Syringe-Needle U-100 25G X 1" 1 ML MISC   Does not apply   1 applicator by Does not apply route 2 (two) times daily. Please give needles 4 Levemir.   200 each   0   . isosorbide mononitrate (IMDUR) 60 MG 24 hr tablet   Oral   Take 60 mg by mouth daily.         . potassium chloride SA (K-DUR,KLOR-CON) 20 MEQ tablet   Oral   Take 1 tablet (20 mEq total) by mouth daily.   30 tablet   3   . rosuvastatin (CRESTOR) 40 MG tablet   Oral   Take 1 tablet (40 mg total) by mouth at bedtime.   30 tablet   6   . tiotropium (SPIRIVA HANDIHALER) 18 MCG inhalation capsule   Inhalation   Place 1 capsule (18  mcg total) into inhaler and inhale daily.   30 capsule   3     BP 183/92  Pulse 92   Temp(Src) 97.9 F (36.6 C) (Oral)  Resp 18  Ht 6' (1.829 m)  Wt 320 lb 12.8 oz (145.514 kg)  BMI 43.5 kg/m2  SpO2 97%  Physical Exam  Nursing note and vitals reviewed. Constitutional: He appears well-developed and well-nourished. No distress.  HENT:  Head: Normocephalic and atraumatic.  Neck: Neck supple.  Cardiovascular: Normal rate and regular rhythm.   Pulmonary/Chest: Effort normal. No respiratory distress. He has wheezes. He has no rales.  Abdominal: Soft. He exhibits no distension and no mass. There is no tenderness. There is no rebound and no guarding.  obese  Musculoskeletal: He exhibits edema.  Bilateral lower extremity pitting edema to the knees.  Neurological: He is alert. He exhibits normal muscle tone.  Skin: He is not diaphoretic.    ED Course  Procedures (including critical care time)  Labs Reviewed  CBC - Abnormal; Notable for the following:    RBC 3.45 (*)    Hemoglobin 9.3 (*)    HCT 29.5 (*)    All other components within normal limits  BASIC METABOLIC PANEL - Abnormal; Notable for the following:    CO2 16 (*)    Glucose, Bld 254 (*)    BUN 69 (*)    Creatinine, Ser 5.75 (*)    GFR calc non Af Amer 10 (*)    GFR calc Af Amer 12 (*)    All other components within normal limits  PRO B NATRIURETIC PEPTIDE - Abnormal; Notable for the following:    Pro B Natriuretic peptide (BNP) 3029.0 (*)    All other components within normal limits  TROPONIN I  POCT I-STAT TROPONIN I   Dg Chest 2 View  02/17/2013   *RADIOLOGY REPORT*  Clinical Data: Shortness of breath  CHEST - 2 VIEW  Comparison: 10/13/2012  Findings: Stable cardiomegaly with chronic vascular and interstitial changes diffusely.  No definite superimposed pneumonia, CHF, effusion or pneumothorax.  Trachea midline.  No significant interval change.  IMPRESSION: Cardiomegaly with chronic vascular and interstitial prominence.   Original Report Authenticated By: Jerilynn Mages. Annamaria Boots, M.D.     Date: 02/17/2013  Rate:  89  Rhythm: normal sinus rhythm and sinus arrhythmia  QRS Axis: left  Intervals: normal  ST/T Wave abnormalities: nonspecific T wave changes  Conduction Disutrbances:none  Narrative Interpretation:   Old EKG Reviewed: unchanged  2:19 PM Anion gap is 13.  1. CHF exacerbation     MDM  Pt with hx CHF who is not taking his lasix, increased SOB, DOE, and orthopnea as well as bilateral lower extremity edema over months.  No chest pain.  No acute change in symptoms though pt states he is now not able to function at home.  Pt started on lasix in ED and given nitroglycerin.  Admitted to [facility] for observation for continued diuresis that will need to be mindful of his poor kidney function.          Clayton Bibles, PA-C 02/17/13 1520

## 2013-02-18 DIAGNOSIS — J449 Chronic obstructive pulmonary disease, unspecified: Secondary | ICD-10-CM

## 2013-02-18 DIAGNOSIS — I5042 Chronic combined systolic (congestive) and diastolic (congestive) heart failure: Secondary | ICD-10-CM

## 2013-02-18 LAB — CBC
Hemoglobin: 9.8 g/dL — ABNORMAL LOW (ref 13.0–17.0)
MCH: 26.9 pg (ref 26.0–34.0)
MCHC: 31.9 g/dL (ref 30.0–36.0)
MCV: 84.3 fL (ref 78.0–100.0)
RBC: 3.64 MIL/uL — ABNORMAL LOW (ref 4.22–5.81)

## 2013-02-18 LAB — RENAL FUNCTION PANEL
BUN: 75 mg/dL — ABNORMAL HIGH (ref 6–23)
CO2: 18 mEq/L — ABNORMAL LOW (ref 19–32)
Calcium: 9.1 mg/dL (ref 8.4–10.5)
Chloride: 106 mEq/L (ref 96–112)
Creatinine, Ser: 6.04 mg/dL — ABNORMAL HIGH (ref 0.50–1.35)
GFR calc non Af Amer: 9 mL/min — ABNORMAL LOW (ref >90)

## 2013-02-18 LAB — HEMOGLOBIN A1C: Mean Plasma Glucose: 209 mg/dL — ABNORMAL HIGH (ref <117)

## 2013-02-18 LAB — TROPONIN I: Troponin I: <0.3 ng/mL (ref <0.30)

## 2013-02-18 LAB — GLUCOSE, CAPILLARY
Glucose-Capillary: 424 mg/dL — ABNORMAL HIGH (ref 70–99)
Glucose-Capillary: 419 mg/dL — ABNORMAL HIGH (ref 70–99)
Glucose-Capillary: 379 mg/dL — ABNORMAL HIGH (ref 70–99)

## 2013-02-18 MED ORDER — INSULIN ASPART 100 UNIT/ML ~~LOC~~ SOLN
15.0000 [IU] | Freq: Once | SUBCUTANEOUS | Status: AC
  Administered 2013-02-18: 15 [IU] via SUBCUTANEOUS

## 2013-02-18 MED ORDER — INSULIN DETEMIR 100 UNIT/ML ~~LOC~~ SOLN
40.0000 [IU] | Freq: Two times a day (BID) | SUBCUTANEOUS | Status: DC
  Administered 2013-02-18 – 2013-02-19 (×3): 40 [IU] via SUBCUTANEOUS
  Filled 2013-02-18 (×3): qty 0.4

## 2013-02-18 NOTE — Progress Notes (Signed)
PCP note:  Patient seen at bedside.  Patient's SOB is improving and he has diuresed well overnight. Encouraged compliance with home Lasix regimen, to help keep his weight down and keep him out of hospital.  I agree with the excellent care that he is receiving by the inpatient team.   If you have any questions or I can be of any further service you can page me [phone])

## 2013-02-18 NOTE — Progress Notes (Signed)
[facility]  Subjective:  [name] says his breathing is "a little better" with IV furosemide   Objective: Vital signs in last 24 hours: Blood pressure 162/82, pulse 92, temperature 98 F (36.7 C), temperature source Oral, resp. rate 20, height 6' (1.829 m), weight 142.203 kg (313 lb 8 oz), SpO2 97.00%.    PHYSICAL EXAM General--awake, alert, lying flat in bed. Chest--no rales, + exp wheeze Heart--no rub Abd--protuberant, nontender Extr--1+ edema, small AVF patent L FA  I/O last 24 hr 360/3350 Weight 145.5 yesterday--142.2 today.    Lab Results:   Recent Labs Lab 02/17/13 1226 02/17/13 2008 02/18/13 0710  NA 136  --  137  K 5.1  --  5.3*  CL 107  --  106  CO2 16*  --  18*  BUN 69*  --  75*  CREATININE 5.75* 6.03* 6.04*  GLUCOSE 254*  --  333*  CALCIUM 9.1  --  9.1  PHOS  --   --  3.4     Recent Labs  02/17/13 2008 02/18/13 0710  WBC 9.1 8.7  HGB 9.8* 9.8*  HCT 31.1* 30.7*  PLT 208 219     I have reviewed the patient's current medications. Scheduled: . albuterol  2 puff Inhalation BID  . atorvastatin  80 mg Oral q1800  . calcitRIOL  0.25 mcg Oral Q48H  . calcitRIOL  0.5 mcg Oral Q48H  . carvedilol  12.5 mg Oral BID WC  . clopidogrel  75 mg Oral Daily  . darbepoetin (ARANESP) injection - NON-DIALYSIS  100 mcg Subcutaneous Q Wed-1800  . furosemide  160 mg Intravenous Q6H  . heparin  5,000 Units Subcutaneous Q8H  . hydrALAZINE  50 mg Oral TID  . insulin aspart  0-15 Units Subcutaneous TID WC  . insulin detemir  30 Units Subcutaneous BID  . isosorbide mononitrate  60 mg Oral Daily  . metolazone  5 mg Oral Daily  . predniSONE  40 mg Oral Q breakfast  . sodium chloride  3 mL Intravenous Q12H  . tiotropium  18 mcg Inhalation Daily   Continuous:   Assessment/Plan: 1.Renal- advanced CKD at baseline. His creatinine today is 6.04 This is actually better as it was 6.6 when he saw Dr. Jimmy Footman last month.  Continue with IVdiuretics.    2. Hypertension/volume - patient is still volume overloaded.   3. 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 There are iron stores from April which reveal an iron saturation of 28. It appears he has an iron allergy so will not replete. He was supposed to be receiving Procrit as an outpatient. It will be continued in the form of Aranesp here.  4. Hyperkalemia- as an outpatient he is on 20 mEq a day of potassium. I anticipate his potassium will trend down with diuresis. However, I would not automatically placed him on potassium just yet. K 5.3 today 5. Bones-  PTH pending,  Phosphorus 3.4  LOS: 1 day   Siraj Dermody F 02/18/2013,1:52 PM   .labalb

## 2013-02-18 NOTE — Progress Notes (Signed)
Inpatient Diabetes Program Recommendations  AACE/ADA: New Consensus Statement on Inpatient Glycemic Control (2013)  Target Ranges:  Prepandial:   less than 140 mg/dL      Peak postprandial:   less than 180 mg/dL (1-2 hours)      Critically ill patients:  140 - 180 mg/dL   Reason for Visit: Hyperglycemia Results for Jonathon Bailey, Jonathon Bailey (MRN [id number]) as of 02/18/2013 10:43  Ref. Range 02/17/2013 20:55 02/18/2013 06:39  Glucose-Capillary Latest Range: 70-99 mg/dL 248 (H) 322 (H)  Results for Jonathon Bailey, Jonathon Bailey (MRN [id number]) as of 02/18/2013 10:43  Ref. Range 02/17/2013 20:08  Hemoglobin A1C Latest Range: <5.7 % 8.9 (H)    Inpatient Diabetes Program Recommendations Insulin - Basal: Titrate up Levemir until FBS <180 mg/dL HgbA1C: 8.9% - may benefit from OP Diabetes Education

## 2013-02-18 NOTE — Progress Notes (Signed)
UR COMPLETED  

## 2013-02-18 NOTE — Care Management Note (Unsigned)
    Page 1 of 1   02/18/2013     12:12:44 PM   CARE MANAGEMENT NOTE 02/18/2013  Patient:  Jonathon Bailey, Jonathon Bailey   Account Number:  [id number]  Date Initiated:  02/18/2013  Documentation initiated by:  Olga Coaster  Subjective/Objective Assessment:   ADMITTED WITH CHF, COPD     Action/Plan:   Primary Care Kihanna Kamiya: Thersa Salt, DO   Anticipated DC Date:  02/22/2013   Anticipated DC Plan:  [place]  CM consult      Choice offered to / List presented to:  C-1 Patient        [place] arranged  HH-1 RN  [place].   Status of service:  In process, will continue to follow Medicare Important Message given?  NA - LOS <3 / Initial given by admissions (If response is "NO", the following Medicare IM given date fields will be blank) Date Medicare IM given:   Date Additional Medicare IM given:    Discharge Disposition:    Per UR Regulation:  Reviewed for med. necessity/level of care/duration of stay  If discussed at [place] of Stay Meetings, dates discussed:    Comments:  02/18/13 @ 1145.Marland KitchenMarland KitchenFuller Mandril, RN, BSN, [employer]  [phone] Spoke with pt regarding discharge planning.  Ofered pt [employer].  Pt chose [facility] to render services for disease management of CHF.  Janae Sauce of [facility] notified.  No DME needs identified at this time.  02/18/2013- B CHANDLER RN,BSN,MHA

## 2013-02-18 NOTE — Progress Notes (Signed)
Seen and examined.  Discussed with Dr. Ardelia Mems.  Agree with her management.  See my co-sign of the H&PE for my note of today.

## 2013-02-18 NOTE — Progress Notes (Addendum)
Pt placed on CPAP for night (Auto titrate on RA via nasal mask).  Tolerating well.  RT will monitor as needed.

## 2013-02-18 NOTE — Plan of Care (Signed)
Problem: Phase I Progression Outcomes Goal: EF % per last Echo/documented,Core Reminder form on chart Outcome: Completed/Met Date Met:  02/18/13 10-13-12  EF 40-45%

## 2013-02-18 NOTE — H&P (Signed)
Seen and examined.  Discussed with Dr. Thomes Dinning.  Agree with her management.  Briefly, 57 yo male with extensive cardiac history presents with weight gain and worsening shortness of breath.  He is 25-30 lbs above his dry weight on admit.  And he is feeling much better now with initial diuresis.  While his history is very complicated, it seems that this is a mostly a CHF exacerbation.  Because of his history of COPD and wheezing and exposure to respiratory illness, we are also treating as a COPD exacerbation.  He seems to be responding to therapy nicely.

## 2013-02-18 NOTE — Progress Notes (Signed)
02/18/13 Patient CBG 424, MD paged with orders to increase Levemir dose and to give 15 units of novolog per sliding scale. Rechceck CBG in 1 hour to see if more novolog needed. Catha Gosselin RN

## 2013-02-18 NOTE — Progress Notes (Signed)
02/18/13 Rechecked CBG, 419. MD paged and waiting for orders. Catha Gosselin RN

## 2013-02-18 NOTE — Progress Notes (Signed)
Family Medicine Teaching Service Daily Progress Note Service Page: [phone]  Patient Assessment: Jonathon Bailey is a 57 y.o. year old male with known CHF presenting with an acute CHF exacerbation, likely secondary to non-compliance with home lasix. No evidence of fluid overload on CXR, but does clinically appear fluid overloaded and has elevated pro BNP. Respiratory symptoms may also be due to COPD exacerbation given prominent wheezing on exam.   Subjective: Still with some shortness of breath overnight. Wore CPAP intermittently overnight. Has been urinating frequently.  Objective: Temp:  [97.9 F (36.6 C)-98.3 F (36.8 C)] 98 F (36.7 C) (05/28 0500) Pulse Rate:  [86-99] 99 (05/28 0500) Resp:  [13-25] 18 (05/28 0500) BP: (170-194)/(80-105) 194/96 mmHg (05/28 0500) SpO2:  [95 %-99 %] 99 % (05/28 0500) Weight:  [313 lb 8 oz (142.203 kg)-320 lb 12.8 oz (145.514 kg)] 313 lb 8 oz (142.203 kg) (05/28 0500) Exam: General: NAD, sleeping in bed, awakens to voice/touch Cardiovascular: RRR Respiratory: scattered wheezes throughout, overall improved from yesterday. Has a lot of transmitted upper airway sounds. Mildly increased work of breathing. Abdomen: remains distended but nontender to palpation Extremities: still 3+ lower extremity edema in bilateral lower extremities. Calves nontender to palpation.  I have reviewed the patient's medications, labs, imaging, and diagnostic testing.  Notable results are summarized below.  Medications:  Scheduled Meds: . albuterol  2 puff Inhalation BID  . atorvastatin  80 mg Oral q1800  . calcitRIOL  0.25 mcg Oral Q48H  . calcitRIOL  0.5 mcg Oral Q48H  . carvedilol  12.5 mg Oral BID WC  . clopidogrel  75 mg Oral Daily  . darbepoetin (ARANESP) injection - NON-DIALYSIS  100 mcg Subcutaneous Q Wed-1800  . furosemide  160 mg Intravenous Q6H  . heparin  5,000 Units Subcutaneous Q8H  . hydrALAZINE  50 mg Oral TID  . insulin aspart  0-15 Units  Subcutaneous TID WC  . insulin detemir  30 Units Subcutaneous BID  . isosorbide mononitrate  60 mg Oral Daily  . metolazone  5 mg Oral Daily  . predniSONE  40 mg Oral Q breakfast  . sodium chloride  3 mL Intravenous Q12H  . tiotropium  18 mcg Inhalation Daily   Continuous Infusions:  PRN Meds:.sodium chloride, acetaminophen, ondansetron (ZOFRAN) IV, polyethylene glycol, sodium chloride  Labs:  CBC  Recent Labs Lab 02/17/13 1226 02/17/13 2008 02/18/13 0710  WBC 7.4 9.1 8.7  HGB 9.3* 9.8* 9.8*  HCT 29.5* 31.1* 30.7*  PLT 197 208 219    BMET  Recent Labs Lab 02/17/13 1226 02/17/13 2008 02/18/13 0710  NA 136  --  137  K 5.1  --  5.3*  CL 107  --  106  CO2 16*  --  18*  BUN 69*  --  75*  CREATININE 5.75* 6.03* 6.04*  GLUCOSE 254*  --  333*  CALCIUM 9.1  --  9.1   A1c 8.9  Urine output 3.35L (net negative 3L since admission) Weight down 4lb today (313 lb, dry weight 295)  Assessment/Plan:  Jonathon Bailey is a 57 y.o. year old male with known CHF presenting with an acute CHF exacerbation, likely secondary to non-compliance with home lasix. No evidence of fluid overload on CXR, but does clinically appear fluid overloaded and has elevated pro BNP. Respiratory symptoms may also be due to COPD exacerbation given prominent wheezing on exam.   # CHF exacerbation: BNP elevated. Last echo was January 2014 and noted EF 40-45%, moderate hypokinesis of lateral  myocardium, grade 2 diastolic dysfunction. Dry weight per pt is 295lb.  - good diuresis thus far (3.35L out) - trop negative x 3 - continue to monitor on tele - EKG this morning non ischemic - diuresing with lasix 160 QID per renal  - strict I&O  - daily weights  - cannot do ACE inhibitor due to CKD stage 5  # Hyperkalemia: - 5.3 today. No peaked t waves on EKG - will be sure to stop K supplementation upon discharge  # CKD: has fistula placed but not on dialysis. Baseline creatinine unclear, was 6-7 in January  but that was in setting of acute CHF exacerbation. Prior to that admission was 3-4.  - renal consulted on admission and are following - Calcium supplementation  # Hypertension:  - continue home hydralazine, imdur, and carvedilol as above.  - decrease carvedilol dose to 12.5mg  BID in setting of acute CHF exacerbation with hx of CAD  - persistently hypertensive, although will likely improve with diruesis so will continue to monitor for now.  # COPD:  - continue home spiriva  - schedule albuterol 2 puff q4h for wheezing  - prednisone 40mg  daily for possible exacerbation  - monitor for improvement, consider antibiotics  - may need PFTs as outpatient  # Diabetes: takes levemir 75 BID at home.  - Hgb A1c 8.9 upon admission - continue levemir at decreased dose with moderate SSI for now, will titrate levemir tomorrow once has been on for 24 hours - will need close outpatient f/u  # CAD s/p MI with stents:  - continue plavix, imdur   # Sleep apnea:  - CPAP while hospitalized   # HLD: continue statin   # FEN/GI:  - renal diet - SLIV  # Prophylaxis:  - heparin SQ   # Dispo:  - pending clinical improvement and adequate diuresis   # Code Status: discussed with patient on admission, he desires to be full code.   Chrisandra Netters, MD Family Medicine PGY-1 Service Pager [phone]

## 2013-02-19 DIAGNOSIS — N185 Chronic kidney disease, stage 5: Secondary | ICD-10-CM

## 2013-02-19 LAB — GLUCOSE, CAPILLARY
Glucose-Capillary: 242 mg/dL — ABNORMAL HIGH (ref 70–99)
Glucose-Capillary: 369 mg/dL — ABNORMAL HIGH (ref 70–99)
Glucose-Capillary: 480 mg/dL — ABNORMAL HIGH (ref 70–99)

## 2013-02-19 LAB — BASIC METABOLIC PANEL
Chloride: 101 mEq/L (ref 96–112)
GFR calc Af Amer: 10 mL/min — ABNORMAL LOW (ref >90)
GFR calc non Af Amer: 9 mL/min — ABNORMAL LOW (ref >90)
Potassium: 4.7 mEq/L (ref 3.5–5.1)
Sodium: 135 mEq/L (ref 135–145)

## 2013-02-19 LAB — PARATHYROID HORMONE, INTACT (NO CA): PTH: 369.2 pg/mL — ABNORMAL HIGH (ref 14.0–72.0)

## 2013-02-19 MED ORDER — INSULIN DETEMIR 100 UNIT/ML ~~LOC~~ SOLN
55.0000 [IU] | Freq: Two times a day (BID) | SUBCUTANEOUS | Status: DC
  Filled 2013-02-19: qty 0.55

## 2013-02-19 MED ORDER — FUROSEMIDE 80 MG PO TABS
160.0000 mg | ORAL_TABLET | Freq: Three times a day (TID) | ORAL | Status: DC
  Administered 2013-02-19 – 2013-02-20 (×3): 160 mg via ORAL
  Filled 2013-02-19 (×5): qty 2

## 2013-02-19 MED ORDER — MUPIROCIN 2 % EX OINT
TOPICAL_OINTMENT | Freq: Two times a day (BID) | CUTANEOUS | Status: DC
  Administered 2013-02-19 – 2013-02-26 (×15): via NASAL
  Administered 2013-02-26: 1 via NASAL
  Administered 2013-02-27: 12:00:00 via NASAL
  Filled 2013-02-19: qty 22

## 2013-02-19 MED ORDER — CHLORHEXIDINE GLUCONATE CLOTH 2 % EX PADS
6.0000 | MEDICATED_PAD | Freq: Every day | CUTANEOUS | Status: DC
  Administered 2013-02-19 – 2013-02-24 (×6): 6 via TOPICAL

## 2013-02-19 NOTE — Clinical Documentation Improvement (Signed)
CHF DOCUMENTATION CLARIFICATION QUERY  CLINICAL DOCUMENTATION QUERIES ARE NOT PART OF THE PERMANENT MEDICAL RECORD  Please update your documentation within the medical record to reflect your response to this query.                                                                                      02/19/13  Dr. Ardelia Mems,   In a better effort to capture your patient's severity of illness, reflect appropriate length of stay and utilization of resources, a review of the patient medical record has revealed the following information:   "CHF exacerbation: BNP elevated. Last echo was January 2014 and noted EF 40-45%, moderate hypokinesis of lateral myocardium, grade 2 diastolic dysfunction. Dry weight per pt is 295lb.  - good diuresis thus far (3.35L out)" Progress Notes signed by Leeanne Rio, MD at 02/18/2013 12:10 PM    - Known history of Hypertension.    - Cardiomegaly by CXR 02/17/13.    - Moderate Left Ventricular Hypertrophy by Echo 10/14/12.    - Known history of Systolic and Diastolic Heart Failure per Hospital Problem List Reviewed 01/13/2013    Based on your clinical judgment, please document the ACUITY and TYPE of Heart Failure monitored and treated this admission:    ACUITY:  - Acute  - Acute on Chronic  - Chronic  AND  TYPE:  - Systolic  - Diastolic  - Combined    - Please also include if the patient has Hypertensive Heart Disease or Another Condition related to his history of HTN, Cardiomegaly and Diastolic Dysfunction.     Thank you!   In responding to this query please exercise your independent judgment.     The fact that a query is asked, does not imply that any particular answer is desired or expected.   Reviewed: additional documentation in the medical record  Thank You,  Erling Conte  RN BSN CCDS Certified Clinical Documentation Specialist: Cell   [phone]  Health Information Management [place]:  1. If needed, update documentation for the patient's encounter via the notes activity.  2. Access this query again and click edit on the In [place] [place].  3. After updating, or not, click F2 to complete all highlighted (required) fields concerning your review. Select "additional documentation in the medical record" OR "no additional documentation provided".  4. Click Sign note button.  5. The deficiency will fall out of your In Basket *Please let us know if you are not able to complete this workflow by phone or e-mail (listed below).

## 2013-02-19 NOTE — Progress Notes (Signed)
Seen and examined.  Agree with Dr. Ardelia Mems.  Diuresing nicely.  Continue current Rx.

## 2013-02-19 NOTE — Progress Notes (Signed)
Family Medicine Teaching Service Daily Progress Note Service Page: [phone]  Patient Assessment: Jonathon Bailey is a 57 y.o. year old male with known CHF presenting with an acute CHF exacerbation, likely secondary to non-compliance with home lasix. No evidence of fluid overload on CXR, but does clinically appear fluid overloaded and has elevated pro BNP. Respiratory symptoms may also be due to COPD exacerbation given prominent wheezing on exam.   Subjective: Reports breathing feels somewhat better today. Thinks he is less fluid overloaded.  Objective: Temp:  [98.2 F (36.8 C)-98.7 F (37.1 C)] 98.3 F (36.8 C) (05/29 0538) Pulse Rate:  [89-95] 91 (05/29 0538) Resp:  [16-20] 18 (05/29 0538) BP: (145-192)/(75-93) 159/91 mmHg (05/29 0538) SpO2:  [93 %-98 %] 98 % (05/29 0538) Weight:  [307 lb 11.2 oz (139.572 kg)] 307 lb 11.2 oz (139.572 kg) (05/29 0538) Exam: General: NAD, sitting up in bed Cardiovascular: RRR Respiratory: diminished inspiratory sounds, especially in RLL. scattered expiratory wheezes throughout. Normal respiratory effort. Abdomen: remains distended but nontender to palpation Extremities: 2+ lower extremity edema in bilateral lower extremities. Calves nontender to palpation. Swelling improved.   Filed Weights   02/17/13 1843 02/18/13 0500 02/19/13 0538  Weight: 317 lb 14.4 oz (144.198 kg) 313 lb 8 oz (142.203 kg) 307 lb 11.2 oz (139.572 kg)    I have reviewed the patient's medications, labs, imaging, and diagnostic testing.  Notable results are summarized below.  Medications:  Scheduled Meds: . albuterol  2 puff Inhalation BID  . atorvastatin  80 mg Oral q1800  . calcitRIOL  0.25 mcg Oral Q48H  . calcitRIOL  0.5 mcg Oral Q48H  . carvedilol  12.5 mg Oral BID WC  . Chlorhexidine Gluconate Cloth  6 each Topical Q0600  . clopidogrel  75 mg Oral Daily  . darbepoetin (ARANESP) injection - NON-DIALYSIS  100 mcg Subcutaneous Q Wed-1800  . furosemide  160 mg  Intravenous Q6H  . heparin  5,000 Units Subcutaneous Q8H  . hydrALAZINE  50 mg Oral TID  . insulin aspart  0-15 Units Subcutaneous TID WC  . insulin detemir  40 Units Subcutaneous BID  . isosorbide mononitrate  60 mg Oral Daily  . metolazone  5 mg Oral Daily  . mupirocin ointment   Nasal BID  . predniSONE  40 mg Oral Q breakfast  . sodium chloride  3 mL Intravenous Q12H  . tiotropium  18 mcg Inhalation Daily   Continuous Infusions:  PRN Meds:.sodium chloride, acetaminophen, ondansetron (ZOFRAN) IV, polyethylene glycol, sodium chloride  Labs:  CBC  Recent Labs Lab 02/17/13 1226 02/17/13 2008 02/18/13 0710  WBC 7.4 9.1 8.7  HGB 9.3* 9.8* 9.8*  HCT 29.5* 31.1* 30.7*  PLT 197 208 219    BMET  Recent Labs Lab 02/17/13 1226 02/17/13 2008 02/18/13 0710 02/19/13 0400  NA 136  --  137 135  K 5.1  --  5.3* 4.7  CL 107  --  106 101  CO2 16*  --  18* 17*  BUN 69*  --  75* 83*  CREATININE 5.75* 6.03* 6.04* 6.26*  GLUCOSE 254*  --  333* 304*  CALCIUM 9.1  --  9.1 9.5   A1c 8.9  CBG (last 3)   Recent Labs  02/18/13 1640 02/18/13 1904 02/18/13 2057  GLUCAP 424* 419* 379*  Sliding scale: 46 units in last 24 hours  Urine output 4L (net negative 5.6L since admission) Weight down 6lb today (down 10lb since admission, dry weight 295)  Assessment/Plan:  Jonathon Bailey  Jonathon Bailey is a 57 y.o. year old male with known CHF presenting with an acute CHF exacerbation, likely secondary to non-compliance with home lasix. No evidence of fluid overload on CXR, but does clinically appear fluid overloaded and has elevated pro BNP. Respiratory symptoms may also be due to COPD exacerbation given prominent wheezing on exam.   # CHF exacerbation: BNP elevated. Last echo was January 2014 and noted EF 40-45%, moderate hypokinesis of lateral myocardium, grade 2 diastolic dysfunction. Dry weight per pt is 295lb.  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 Baseline creatinine unclear, was 6-7 in January but that was in setting of acute CHF exacerbation. Prior to that admission was 3-4.  - creatinine up some from admission (5.75 to 6.26 today) - renal consulted on admission and are following - Calcium supplementation  # Hypertension:  - continue home hydralazine, imdur, and carvedilol as above.  - decrease carvedilol dose to 12.5mg  BID in setting of acute CHF exacerbation with hx of CAD  - persistently hypertensive although improved. Anticipate will continue to improve with diuresis. Continue to monitor for now.  # COPD:  - continue home spiriva  - continue albuterol - prednisone 40mg  daily for possible exacerbation  - monitor for improvement, consider antibiotics  - may need PFTs as outpatient  # Diabetes: takes levemir 75 BID at home.  - Hgb A1c 8.9 upon admission - continue levemir with SSI - levemir increased to 40 units BID yesterday, anticipate need for further increase but will monitor SSI requirements - will need close outpatient f/u  # CAD s/p MI with stents:  - continue plavix, imdur   # Sleep apnea:  - CPAP while hospitalized   # HLD: continue statin   # FEN/GI:  - renal diet - SLIV  # Prophylaxis:  - heparin SQ   # Dispo:  - pending clinical improvement and adequate diuresis   # Code Status: discussed with patient on admission, he desires to be full code.   Chrisandra Netters, MD Family Medicine PGY-1 Service Pager [phone]

## 2013-02-19 NOTE — Progress Notes (Signed)
[facility]  Subjective:  Awake, alert, wants to know if he should drink more liquids to "flush out my kidneys." I told him "no."   Objective: Vital signs in last 24 hours: Blood pressure 141/78, pulse 91, temperature 98.3 F (36.8 C), temperature source Oral, resp. rate 18, height 6' (1.829 m), weight 139.572 kg (307 lb 11.2 oz), SpO2 96.00%.    PHYSICAL EXAM General--awake, alert, lying flat in bed.  Chest--no rales, + exp wheeze  Heart--no rub  Abd--protuberant, nontender  Extr--1+ edema, small AVF patent L FA  Weight  Date 27 May   145.5 kg 28 May   142.2 29 May   139.6   I/O last 24 hr 1385/4025   Lab Results:   Recent Labs Lab 02/17/13 1226 02/17/13 2008 02/18/13 0710 02/19/13 0400  NA 136  --  137 135  K 5.1  --  5.3* 4.7  CL 107  --  106 101  CO2 16*  --  18* 17*  BUN 69*  --  75* 83*  CREATININE 5.75* 6.03* 6.04* 6.26*  GLUCOSE 254*  --  333* 304*  CALCIUM 9.1  --  9.1 9.5  PHOS  --   --  3.4  --      Recent Labs  02/17/13 2008 02/18/13 0710  WBC 9.1 8.7  HGB 9.8* 9.8*  HCT 31.1* 30.7*  PLT 208 219     I have reviewed the patient's current medications. Scheduled: . albuterol  2 puff Inhalation BID  . atorvastatin  80 mg Oral q1800  . calcitRIOL  0.25 mcg Oral Q48H  . calcitRIOL  0.5 mcg Oral Q48H  . carvedilol  12.5 mg Oral BID WC  . Chlorhexidine Gluconate Cloth  6 each Topical Q0600  . clopidogrel  75 mg Oral Daily  . darbepoetin (ARANESP) injection - NON-DIALYSIS  100 mcg Subcutaneous Q Wed-1800  . furosemide  160 mg Intravenous Q6H  . heparin  5,000 Units Subcutaneous Q8H  . hydrALAZINE  50 mg Oral TID  . insulin aspart  0-15 Units Subcutaneous TID WC  . insulin detemir  40 Units Subcutaneous BID  . isosorbide mononitrate  60 mg Oral Daily  . metolazone  5 mg Oral Daily  . mupirocin ointment   Nasal BID  . predniSONE  40 mg Oral Q breakfast  . sodium chloride  3 mL Intravenous Q12H  . tiotropium  18 mcg  Inhalation Daily    Assessment/Plan: 1.Renal- advanced CKD at baseline. His creatinine today is 6.26 This is better as it was 6.6 when he saw Dr. Jimmy Footman last month. Continue with IV diuretics. I don't think AVF in L FA will be usable for HD 2. Hypertension/volume - patient is still volume overloaded.  3. Anemia - likely mostly related to CKD. There are iron stores from April which reveal an iron saturation of 28. 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  4. Hyperkalemia- K OK today on no supplemental K 5. Bones- PTH pending, Phosphorus 3.4     LOS: 2 days   Kristine Chahal F 02/19/2013,12:03 PM   .labalb

## 2013-02-20 ENCOUNTER — Inpatient Hospital Stay (HOSPITAL_COMMUNITY): Payer: Medicare Other

## 2013-02-20 LAB — CBC
HCT: 34.6 % — ABNORMAL LOW (ref 39.0–52.0)
Hemoglobin: 11.1 g/dL — ABNORMAL LOW (ref 13.0–17.0)
MCV: 84.4 fL (ref 78.0–100.0)
WBC: 13.1 10*3/uL — ABNORMAL HIGH (ref 4.0–10.5)

## 2013-02-20 LAB — GLUCOSE, CAPILLARY
Glucose-Capillary: 444 mg/dL — ABNORMAL HIGH (ref 70–99)
Glucose-Capillary: 334 mg/dL — ABNORMAL HIGH (ref 70–99)
Glucose-Capillary: 266 mg/dL — ABNORMAL HIGH (ref 70–99)
Glucose-Capillary: 444 mg/dL — ABNORMAL HIGH (ref 70–99)

## 2013-02-20 LAB — RENAL FUNCTION PANEL
BUN: 92 mg/dL — ABNORMAL HIGH (ref 6–23)
Chloride: 98 mEq/L (ref 96–112)
Creatinine, Ser: 6.6 mg/dL — ABNORMAL HIGH (ref 0.50–1.35)
Glucose, Bld: 326 mg/dL — ABNORMAL HIGH (ref 70–99)
Phosphorus: 5.6 mg/dL — ABNORMAL HIGH (ref 2.3–4.6)
Potassium: 4.4 mEq/L (ref 3.5–5.1)

## 2013-02-20 MED ORDER — MUPIROCIN 2 % EX OINT: 1.0000 "application " | TOPICAL_OINTMENT | Freq: Two times a day (BID) | CUTANEOUS | Status: DC

## 2013-02-20 MED ORDER — INSULIN ASPART 100 UNIT/ML ~~LOC~~ SOLN
15.0000 [IU] | Freq: Once | SUBCUTANEOUS | Status: AC
  Administered 2013-02-20: 15 [IU] via SUBCUTANEOUS

## 2013-02-20 MED ORDER — TORSEMIDE 100 MG PO TABS
100.0000 mg | ORAL_TABLET | Freq: Two times a day (BID) | ORAL | Status: DC
  Administered 2013-02-20 – 2013-02-23 (×7): 100 mg via ORAL
  Filled 2013-02-20 (×9): qty 1

## 2013-02-20 MED ORDER — INSULIN ASPART 100 UNIT/ML ~~LOC~~ SOLN
4.0000 [IU] | Freq: Three times a day (TID) | SUBCUTANEOUS | Status: DC
  Administered 2013-02-21 – 2013-02-22 (×4): 4 [IU] via SUBCUTANEOUS

## 2013-02-20 MED ORDER — CHLORHEXIDINE GLUCONATE CLOTH 2 % EX PADS: 6.0000 | MEDICATED_PAD | Freq: Every day | CUTANEOUS | Status: DC

## 2013-02-20 MED ORDER — METOLAZONE 5 MG PO TABS: 5.0000 mg | ORAL_TABLET | Freq: Every day | ORAL | Status: DC

## 2013-02-20 MED ORDER — INSULIN DETEMIR 100 UNIT/ML ~~LOC~~ SOLN
60.0000 [IU] | Freq: Two times a day (BID) | SUBCUTANEOUS | Status: DC
  Administered 2013-02-20 – 2013-02-22 (×4): 60 [IU] via SUBCUTANEOUS
  Filled 2013-02-20 (×6): qty 0.6

## 2013-02-20 MED ORDER — CALCITRIOL 0.5 MCG PO CAPS
0.5000 ug | ORAL_CAPSULE | Freq: Every day | ORAL | Status: DC
  Administered 2013-02-21 – 2013-02-27 (×7): 0.5 ug via ORAL
  Filled 2013-02-20 (×7): qty 1

## 2013-02-20 MED ORDER — INSULIN DETEMIR 100 UNIT/ML ~~LOC~~ SOLN
55.0000 [IU] | Freq: Two times a day (BID) | SUBCUTANEOUS | Status: DC
  Administered 2013-02-20: 55 [IU] via SUBCUTANEOUS
  Filled 2013-02-20 (×3): qty 0.55

## 2013-02-20 NOTE — Progress Notes (Signed)
Inpatient Diabetes Program Recommendations  AACE/ADA: New Consensus Statement on Inpatient Glycemic Control (2013)  Target Ranges:  Prepandial:   less than 140 mg/dL      Peak postprandial:   less than 180 mg/dL (1-2 hours)      Critically ill patients:  140 - 180 mg/dL  Hyperglycemia on Prednisone   Inpatient Diabetes Program Recommendations Insulin - Basal: Titrate up Levemir until FBS <180 mg/dL Insulin - Meal Coverage: Please add some meal coverage at least while pt is still on Prednisone through tomrrow.  Add 4-6 unis meal coverage.  May need this as well without steroid therapy. HgbA1C: 8.9% - may benefit from OP Diabetes Education Pt may need meal coverage in addition to correction insulin even without steroid therapy, as A1C is 8.9% Thank you, Rosita Kea, RN, CNS, Diabetes Coordinator [phone])

## 2013-02-20 NOTE — Progress Notes (Signed)
Patient's CBG rechecked.  CBG 444.  MD notified.  New orders given to administer 15 units of the morning Novolog sliding scale now.  The RN will carry out the orders and will continue to monitor the patient.

## 2013-02-20 NOTE — Progress Notes (Signed)
Seen and examined.  Agree with Dr. Ardelia Mems.  Patient is fixated on the fact that the furosemide pills do not work.  I have taken the liberty to switch him to torsemide.

## 2013-02-20 NOTE — Progress Notes (Signed)
FMTS interval progress note  Received call from nursing that patients cbg was 444. Advised to give 15 u novolog and call with repeat in one hour. Repeat was 518. Went to discuss with the patient and he stated he ate half a hamburger in addition to his meal tray. Advised patient that he can't do this as it would cause his blood sugar to be elevated which is not good for him. Patient stated that the hospital food was not good and the renal diet was not cutting it.   Will give additional 15 u novolog now. Titrated up levemir to 60 u BID as patient is on 75 u BID at home. Additionally added 4 u novolog for meal time coverage in addition to SSI. Will continue to follow CBG.  Tommi Rumps, MD PGY1 FMTS

## 2013-02-20 NOTE — Progress Notes (Signed)
[facility]  Subjective:  Coughing this afternoon.  Still with edema.  Now on po furosemide 160 TID and metolazone 5/d   Objective: Vital signs in last 24 hours: Blood pressure 169/99, pulse 95, temperature 97.2 F (36.2 C), temperature source Oral, resp. rate 20, height 6' (1.829 m), weight 137.3 kg (302 lb 11.1 oz), SpO2 94.00%.    PHYSICAL EXAM General--awake, alert, lying flat in bed.  Chest--no rales, + exp wheeze  Heart--no rub  Abd--protuberant, nontender  Extr--1+ edema, small AVF patent L FA   Weight  Date  27 May  145.5 kg  28 May  142.2  29 May  139.6 30 May              137.3     Lab Results:   Recent Labs Lab 02/18/13 0710 02/19/13 0400 02/20/13 0635  NA 137 135 134*  K 5.3* 4.7 4.4  CL 106 101 98  CO2 18* 17* 21  BUN 75* 83* 92*  CREATININE 6.04* 6.26* 6.60*  GLUCOSE 333* 304* 326*  CALCIUM 9.1 9.5 9.8  PHOS 3.4  --  5.6*     Recent Labs  02/18/13 0710 02/20/13 0635  WBC 8.7 13.1*  HGB 9.8* 11.1*  HCT 30.7* 34.6*  PLT 219 246     I have reviewed the patient's current medications. Scheduled: . albuterol  2 puff Inhalation BID  . atorvastatin  80 mg Oral q1800  . calcitRIOL  0.25 mcg Oral Q48H  . calcitRIOL  0.5 mcg Oral Q48H  . carvedilol  12.5 mg Oral BID WC  . Chlorhexidine Gluconate Cloth  6 each Topical Q0600  . clopidogrel  75 mg Oral Daily  . darbepoetin (ARANESP) injection - NON-DIALYSIS  100 mcg Subcutaneous Q Wed-1800  . furosemide  160 mg Oral TID  . heparin  5,000 Units Subcutaneous Q8H  . hydrALAZINE  50 mg Oral TID  . insulin aspart  0-15 Units Subcutaneous TID WC  . insulin detemir  55 Units Subcutaneous BID  . isosorbide mononitrate  60 mg Oral Daily  . metolazone  5 mg Oral Daily  . mupirocin ointment   Nasal BID  . predniSONE  40 mg Oral Q breakfast  . sodium chloride  3 mL Intravenous Q12H  . tiotropium  18 mcg Inhalation Daily    Assessment/Plan: 1.Renal- advanced CKD at baseline. His  creatinine today is 6.6  it was 6.6 when he saw Dr. Jimmy Footman last month). Continue with po diuretics. I don't think AVF in L FA will be usable for HD.  If he can't lose weight with po diuretics, then he may need to start dialysis  2. Hypertension/volume - patient is still volume overloaded.  3. Anemia - likely mostly related to CKD. There are iron stores from April which reveal an iron saturation of 28. 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  4. Hyperkalemia- K 4.4 today on no supplemental K  5. Bones- PTH 369, Phosphorus 3.4.  Will increase calcitriol to 0.5/day     LOS: 3 days   Jonathon Bailey F 02/20/2013,2:27 PM   .labalb

## 2013-02-20 NOTE — Progress Notes (Signed)
02/20/13 2319  BiPAP/CPAP/SIPAP  BiPAP/CPAP/SIPAP Pt Type (RT when to room twice, stated he will notifiy RT when ready )

## 2013-02-20 NOTE — Progress Notes (Signed)
Family Medicine Teaching Service Daily Progress Note Service Page: [phone]  Patient Assessment: Jonathon Bailey is a 57 y.o. year old male with known CHF presenting with an acute CHF exacerbation, likely secondary to non-compliance with home lasix. No evidence of fluid overload on CXR, but does clinically appear fluid overloaded and has elevated pro BNP. Respiratory symptoms may also be due to COPD exacerbation given prominent wheezing on exam.   Subjective: Reports breathing is improving. Still has swelling in his legs. Thinks he needs another day of diuresis before going home.  Objective: Temp:  [97.2 F (36.2 C)-98.2 F (36.8 C)] 97.2 F (36.2 C) (05/30 0552) Pulse Rate:  [86-95] 95 (05/30 0552) Resp:  [18-20] 20 (05/30 0552) BP: (133-176)/(78-99) 169/99 mmHg (05/30 0552) SpO2:  [94 %-96 %] 94 % (05/30 0552) Weight:  [302 lb 11.1 oz (137.3 kg)] 302 lb 11.1 oz (137.3 kg) (05/30 0552) Exam: General: NAD, sitting up in bed about to receive medications Cardiovascular: RRR Respiratory: diminished inspiratory sounds throughout, still especially in RLL. scattered expiratory wheezes. Normal respiratory effort. Abdomen: remains distended but nontender to palpation Extremities: 2+ lower extremity edema in bilateral lower extremities, seems. Calves nontender to palpation. Swelling continues to improve.  Filed Weights   02/18/13 0500 02/19/13 0538 02/20/13 0552  Weight: 313 lb 8 oz (142.203 kg) 307 lb 11.2 oz (139.572 kg) 302 lb 11.1 oz (137.3 kg)    I have reviewed the patient's medications, labs, imaging, and diagnostic testing.  Notable results are summarized below.  Medications:  Scheduled Meds: . albuterol  2 puff Inhalation BID  . atorvastatin  80 mg Oral q1800  . calcitRIOL  0.25 mcg Oral Q48H  . calcitRIOL  0.5 mcg Oral Q48H  . carvedilol  12.5 mg Oral BID WC  . Chlorhexidine Gluconate Cloth  6 each Topical Q0600  . clopidogrel  75 mg Oral Daily  . darbepoetin (ARANESP)  injection - NON-DIALYSIS  100 mcg Subcutaneous Q Wed-1800  . furosemide  160 mg Oral TID  . heparin  5,000 Units Subcutaneous Q8H  . hydrALAZINE  50 mg Oral TID  . insulin aspart  0-15 Units Subcutaneous TID WC  . insulin detemir  55 Units Subcutaneous BID  . isosorbide mononitrate  60 mg Oral Daily  . metolazone  5 mg Oral Daily  . mupirocin ointment   Nasal BID  . predniSONE  40 mg Oral Q breakfast  . sodium chloride  3 mL Intravenous Q12H  . tiotropium  18 mcg Inhalation Daily   Continuous Infusions:  PRN Meds:.sodium chloride, acetaminophen, ondansetron (ZOFRAN) IV, polyethylene glycol, sodium chloride  Labs:  CBC  Recent Labs Lab 02/17/13 2008 02/18/13 0710 02/20/13 0635  WBC 9.1 8.7 13.1*  HGB 9.8* 9.8* 11.1*  HCT 31.1* 30.7* 34.6*  PLT 208 219 246    BMET  Recent Labs Lab 02/18/13 0710 02/19/13 0400 02/20/13 0635  NA 137 135 134*  K 5.3* 4.7 4.4  CL 106 101 98  CO2 18* 17* 21  BUN 75* 83* 92*  CREATININE 6.04* 6.26* 6.60*  GLUCOSE 333* 304* 326*  CALCIUM 9.1 9.5 9.8   A1c 8.9  CBG (last 3)   Recent Labs  02/19/13 2232 02/20/13 0228 02/20/13 0545  GLUCAP 495* 444* 334*  Sliding scale: 35 units in last 24 hours  Urine output 3.5L (net negative 7.6L since admission) Weight down 5lb today (down 15lb since admission, dry weight 295)   Assessment/Plan:  Jonathon Bailey is a 56 y.o. year old  male with known CHF presenting with an acute CHF exacerbation, likely secondary to non-compliance with home lasix. No evidence of fluid overload on CXR, but does clinically appear fluid overloaded and has elevated pro BNP. Respiratory symptoms may also be due to COPD exacerbation given prominent wheezing on exam.   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 Last echo was January 2014 and noted EF 40-45%, moderate hypokinesis of lateral myocardium, grade 2 diastolic dysfunction. Dry weight per pt is 295lb.  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 Baseline creatinine unclear, was 6-7 in January but that was in setting of acute CHF exacerbation. Prior to that admission was 3-4.  - creatinine up some from admission (5.75 to 6.6 today) - renal consulted on admission and are following - Calcium supplementation  # Hypertension:  - continue home hydralazine, imdur, and carvedilol as above.  - decrease carvedilol dose to 12.5mg  BID in setting of acute CHF exacerbation with hx of CAD  - persistently hypertensive although still slightly improved. Anticipate will continue to improve with diuresis. Continue to monitor for now.  # COPD:  - continue home spiriva  - continue albuterol - prednisone 40mg  daily for possible exacerbation  - monitor for improvement, consider antibiotics  - may need PFTs as outpatient - consider CXR given diminished sounds in RLL, discuss at rounds  # Diabetes: takes levemir 75 BID at home.  - Hgb A1c 8.9 upon admission - continue levemir with SSI - levemir increased to 55 units BID overnight due to persistent hyperglycemia, anticipate need for further increase but will monitor SSI requirements - will need close outpatient f/u  # CAD s/p MI with stents:  - continue plavix, imdur   # Sleep apnea:  - CPAP while hospitalized   # HLD: continue statin   # FEN/GI:  - renal diet - SLIV  # Prophylaxis:  - heparin SQ   # Dispo:  - pending clinical improvement and adequate diuresis  - possible d/c home tomorrow if renal approves  # Code Status: discussed with patient on admission, he desires to be full code.   Jonathon Netters,  MD Family Medicine PGY-1 Service Pager [phone]

## 2013-02-20 NOTE — Progress Notes (Signed)
Patient evaluated for community based chronic disease management services with [facility] as a benefit of patient's [employer]. Spoke with patient at bedside to explain [facility] services.  Patient has declined services at this time maintaining that he has a significant other that manages his care.  He would however like to request that he sees a single MD in the [facility].  Currently he sees the rotating resident MD and feels that this may be contributing to his decline. Left contact information and [facility] literature at bedside. Made inpatient Case Manager aware that [facility] has consulted and patients request. Of note, [facility] Care Management services does not replace or interfere with any services that are arranged by inpatient case management or social work.  For additional questions or referrals please contact Corliss Blacker BSN RN [facility] Liaison at [phone].

## 2013-02-20 NOTE — Progress Notes (Signed)
Pt's BS at 1645 was 444mg /dl.  Dr. Caryl Bis notified and order to give 15 units of novolog sq x1, and to recheck BS in one hour.  Novolog 15 units given as ordered.  Pt's BS at 1929 was 518mg /dl. And asymptomatic.  Dr. Caryl Bis informed.  Instructed he will be up the unit to see pt.  On coming nurse made aware.  Karie Kirks, Therapist, sports.

## 2013-02-20 NOTE — Progress Notes (Addendum)
The patient's CBG is 495.  The patient stated that he ate fruit and pound cake in the evening.  MD text paged x1.  New orders given.

## 2013-02-21 LAB — CBC
HCT: 33.2 % — ABNORMAL LOW (ref 39.0–52.0)
MCH: 27.3 pg (ref 26.0–34.0)
MCHC: 32.8 g/dL (ref 30.0–36.0)
MCV: 83.2 fL (ref 78.0–100.0)
RDW: 14.3 % (ref 11.5–15.5)
WBC: 12.2 10*3/uL — ABNORMAL HIGH (ref 4.0–10.5)

## 2013-02-21 LAB — RENAL FUNCTION PANEL
BUN: 103 mg/dL — ABNORMAL HIGH (ref 6–23)
Calcium: 9.5 mg/dL (ref 8.4–10.5)
Creatinine, Ser: 6.86 mg/dL — ABNORMAL HIGH (ref 0.50–1.35)
Glucose, Bld: 278 mg/dL — ABNORMAL HIGH (ref 70–99)
Phosphorus: 5.6 mg/dL — ABNORMAL HIGH (ref 2.3–4.6)

## 2013-02-21 LAB — GLUCOSE, CAPILLARY: Glucose-Capillary: 197 mg/dL — ABNORMAL HIGH (ref 70–99)

## 2013-02-21 LAB — HEPATITIS B SURFACE ANTIGEN: Hepatitis B Surface Ag: NEGATIVE

## 2013-02-21 MED ORDER — INSULIN ASPART 100 UNIT/ML ~~LOC~~ SOLN
11.0000 [IU] | Freq: Once | SUBCUTANEOUS | Status: AC
  Administered 2013-02-21: 11 [IU] via SUBCUTANEOUS

## 2013-02-21 NOTE — Progress Notes (Signed)
Family Medicine Teaching Service Daily Progress Note Service Page: [phone]  Patient Assessment: Jonathon Bailey is a 57 y.o. year old male with known CHF presenting with an acute CHF exacerbation, likely secondary to non-compliance with home lasix. No evidence of fluid overload on CXR, but does clinically appear fluid overloaded and has elevated pro BNP. Respiratory symptoms may also be due to COPD exacerbation given prominent wheezing on exam.   Subjective: Overall condition much improved this am per pt. Tolerating PO on RA. Swelling in legs improved. Pt states that this is all due to IV Medications    Objective: Temp:  [97.8 F (36.6 C)-98.7 F (37.1 C)] 97.8 F (36.6 C) (05/31 0620) Pulse Rate:  [75-97] 97 (05/31 0620) Resp:  [18-20] 18 (05/31 0620) BP: (138-160)/(75-85) 138/85 mmHg (05/31 0620) SpO2:  [94 %-97 %] 97 % (05/31 0815) Weight:  [300 lb 4.3 oz (136.2 kg)] 300 lb 4.3 oz (136.2 kg) (05/31 [id number])  Exam: General: NAD, sitting up in chair about to receive medications Cardiovascular: RRR, no m/r/g Respiratory: CTAB, nml effort on RA Abdomen: remains distended but nontender to palpation, NABS Extremities: 1+ lower extremity edema in bilateral lower extremities   Filed Weights   02/19/13 0538 02/20/13 0552 02/21/13 0620  Weight: 307 lb 11.2 oz (139.572 kg) 302 lb 11.1 oz (137.3 kg) 300 lb 4.3 oz (136.2 kg)    I have reviewed the patient's medications, labs, imaging, and diagnostic testing.  Notable results are summarized below.  Medications:  Scheduled Meds: . albuterol  2 puff Inhalation BID  . atorvastatin  80 mg Oral q1800  . calcitRIOL  0.5 mcg Oral Daily  . carvedilol  12.5 mg Oral BID WC  . Chlorhexidine Gluconate Cloth  6 each Topical Q0600  . clopidogrel  75 mg Oral Daily  . darbepoetin (ARANESP) injection - NON-DIALYSIS  100 mcg Subcutaneous Q Wed-1800  . heparin  5,000 Units Subcutaneous Q8H  . hydrALAZINE  50 mg Oral TID  . insulin aspart  0-15  Units Subcutaneous TID WC  . insulin aspart  4 Units Subcutaneous TID WC  . insulin detemir  60 Units Subcutaneous BID  . isosorbide mononitrate  60 mg Oral Daily  . metolazone  5 mg Oral Daily  . mupirocin ointment   Nasal BID  . sodium chloride  3 mL Intravenous Q12H  . tiotropium  18 mcg Inhalation Daily  . torsemide  100 mg Oral BID   Continuous Infusions:  PRN Meds:.sodium chloride, acetaminophen, ondansetron (ZOFRAN) IV, polyethylene glycol, sodium chloride  Labs:  CBC  Recent Labs Lab 02/18/13 0710 02/20/13 0635 02/21/13 0459  WBC 8.7 13.1* 12.2*  HGB 9.8* 11.1* 10.9*  HCT 30.7* 34.6* 33.2*  PLT 219 246 262    BMET  Recent Labs Lab 02/19/13 0400 02/20/13 0635 02/21/13 0440  NA 135 134* 135  K 4.7 4.4 4.2  CL 101 98 98  CO2 17* 21 23  BUN 83* 92* 103*  CREATININE 6.26* 6.60* 6.86*  GLUCOSE 304* 326* 278*  CALCIUM 9.5 9.8 9.5   A1c 8.9  CBG (last 3)   Recent Labs  02/20/13 1920 02/20/13 2324 02/21/13 0713  GLUCAP 518* 443* 197*  Sliding scale: 35 units in last 24 hours  Urine output 8.4L since admission Weight down 20lb since admission (dry wt 295)   Assessment/Plan:  Jonathon Bailey is a 57 y.o. year old male with known CHF presenting with an acute CHF exacerbation, likely secondary to non-compliance with home lasix. No  evidence of fluid overload on CXR, but does clinically appear fluid overloaded and has elevated pro BNP. Respiratory symptoms may also be due to COPD exacerbation given prominent wheezing on exam.   # Exacerbation of acute on chronic combined systolic and diastolic CHF: BNP elevated on admission. Last echo was January 2014 and noted EF 40-45%, moderate hypokinesis of lateral myocardium, grade 2 diastolic dysfunction. Dry weight per pt is 295lb. Continues diuresis on home PO regimen of lasix. Trop negative x 3.  - continue to monitor on tele - contTorsemide 100mg  BID. Some psychologic benefit from pt standpoint from having on  torsemide as pt feels certain that lasix doesn't work.  - strict I&O  - daily weights   # Hyperkalemia: - K remains normal today - will be sure to stop K supplementation upon discharge  # CKD: has fistula placed but not on dialysis. Baseline creatinine unclear, was 6-7 in January but that was in setting of acute CHF exacerbation. Prior to that admission was 3-4. 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 Cr elevated since admission likely secondary to diuresis. Appreciate Renal recommendations - Diuresis as above  - Calcium and phos per renal  # Hypertension: Fairly well controlled, but requires multilple agents. Since admission decreased carvedilol dose to 12.5mg  BID in setting of acute CHF exacerbation with hx of CAD  - continue home hydralazine, imdur, and carvedilol as above.   # COPD: On RA w/ NWB. Recommend PFT outpt.  - continue home spiriva  - continue albuterol - prednisone 40mg  daily for possible exacerbation   # Diabetes: takes levemir 75 BID at home, but compliance is a question. Hgb A1c 8.9 upon admission - continue levemir with SSI - levemir increased to 60 units BID overnight due to persistent hyperglycemia, anticipate need for further increase but will monitor SSI requirements - will need close outpatient f/u  # CAD s/p MI with stents:  - continue plavix, imdur   # Sleep apnea:  - CPAP while hospitalized   # HLD: continue statin   # FEN/GI:  - renal diet - SLIV  # Prophylaxis:  - heparin SQ   # Dispo:  - pending clinical improvement and adequate diuresis  - possible d/c home tomorrow if renal approves  # Code Status: discussed with patient on admission, he desires to be full code.   Linna Darner, MD Family Medicine PGY-2 02/21/2013, 10:33 AM

## 2013-02-21 NOTE — Progress Notes (Signed)
Seen and examined.  Agree with the documentation and management of Dr. Marily Memos

## 2013-02-21 NOTE — Progress Notes (Signed)
Patient's CBG is 443.  MD notified.  New orders given.  RN advised to give the patient 11 units of Novolog insulin.  The RN will carry out the orders and will continue to monitor the patient.

## 2013-02-21 NOTE — Progress Notes (Signed)
[facility]  Subjective:  Coughing less, he's not sure if torsemide has made a difference I/O yesterday 1203/2275     Objective: Vital signs in last 24 hours: Blood pressure 138/85, pulse 97, temperature 97.8 F (36.6 C), temperature source Oral, resp. rate 18, height 6' (1.829 m), weight 136.2 kg (300 lb 4.3 oz), SpO2 97.00%.    PHYSICAL EXAM General--awake, alert, lying flat in bed.  Chest--no rales, + exp wheeze  Heart--no rub  Abd--protuberant, nontender  Extr--1+ edema, small AVF patent L FA  Weight  Date  27 May  145.5 kg  28 May  142.2  29 May  139.6  30 May  137.3 31 May  136.2  Lab Results:   Recent Labs Lab 02/18/13 0710 02/19/13 0400 02/20/13 0635 02/21/13 0440  NA 137 135 134* 135  K 5.3* 4.7 4.4 4.2  CL 106 101 98 98  CO2 18* 17* 21 23  BUN 75* 83* 92* 103*  CREATININE 6.04* 6.26* 6.60* 6.86*  GLUCOSE 333* 304* 326* 278*  CALCIUM 9.1 9.5 9.8 9.5  PHOS 3.4  --  5.6* 5.6*     Recent Labs  02/20/13 0635 02/21/13 0459  WBC 13.1* 12.2*  HGB 11.1* 10.9*  HCT 34.6* 33.2*  PLT 246 262     I have reviewed the patient's current medications. Scheduled: . albuterol  2 puff Inhalation BID  . atorvastatin  80 mg Oral q1800  . calcitRIOL  0.5 mcg Oral Daily  . carvedilol  12.5 mg Oral BID WC  . Chlorhexidine Gluconate Cloth  6 each Topical Q0600  . clopidogrel  75 mg Oral Daily  . darbepoetin (ARANESP) injection - NON-DIALYSIS  100 mcg Subcutaneous Q Wed-1800  . heparin  5,000 Units Subcutaneous Q8H  . hydrALAZINE  50 mg Oral TID  . insulin aspart  0-15 Units Subcutaneous TID WC  . insulin aspart  4 Units Subcutaneous TID WC  . insulin detemir  60 Units Subcutaneous BID  . isosorbide mononitrate  60 mg Oral Daily  . metolazone  5 mg Oral Daily  . mupirocin ointment   Nasal BID  . sodium chloride  3 mL Intravenous Q12H  . tiotropium  18 mcg Inhalation Daily  . torsemide  100 mg Oral BID     Assessment/Plan: 1.Renal-  advanced CKD at baseline. His creatinine today is 6.8 it was 6.6 when he saw Dr. Jimmy Footman last month). Continue with po diuretics (metolazone and torsemide). I don't think AVF in L FA will be usable for HD.Will try current Rx another 48 hr.  If Cr continues to climb and he's still vol expanded, then we should begin dialysis.  I don't think L FA AVF will work--but we'll try it first.   2. Hypertension/volume - patient is still volume overloaded. BP 138/85 today 3. Anemia - likely mostly related to CKD. There are iron stores from April which reveal an iron saturation of 28. 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  4. Hyperkalemia- K 4.2 today on no supplemental K  5. Bones- PTH 369, Phosphorus 3.4. Will increase calcitriol to 0.5/day     LOS: 4 days   Karlee Staff F 02/21/2013,10:10 AM   .labalb

## 2013-02-21 NOTE — Progress Notes (Signed)
Pt. States he can place the cpap on himself when he is ready for bed. Pt. States he has been placing the cpap on himself the past couple of nights. Rt informed the pt. To notify if he needs any assistance.

## 2013-02-22 LAB — RENAL FUNCTION PANEL
BUN: 116 mg/dL — ABNORMAL HIGH (ref 6–23)
Chloride: 94 mEq/L — ABNORMAL LOW (ref 96–112)
Creatinine, Ser: 7.06 mg/dL — ABNORMAL HIGH (ref 0.50–1.35)
Glucose, Bld: 334 mg/dL — ABNORMAL HIGH (ref 70–99)
Potassium: 4.3 mEq/L (ref 3.5–5.1)

## 2013-02-22 LAB — GLUCOSE, CAPILLARY
Glucose-Capillary: 379 mg/dL — ABNORMAL HIGH (ref 70–99)
Glucose-Capillary: 293 mg/dL — ABNORMAL HIGH (ref 70–99)
Glucose-Capillary: 255 mg/dL — ABNORMAL HIGH (ref 70–99)
Glucose-Capillary: 233 mg/dL — ABNORMAL HIGH (ref 70–99)

## 2013-02-22 MED ORDER — INSULIN ASPART 100 UNIT/ML ~~LOC~~ SOLN
0.0000 [IU] | Freq: Every day | SUBCUTANEOUS | Status: DC
  Administered 2013-02-23: 5 [IU] via SUBCUTANEOUS
  Administered 2013-02-24: 3 [IU] via SUBCUTANEOUS
  Administered 2013-02-25: 2 [IU] via SUBCUTANEOUS

## 2013-02-22 MED ORDER — INSULIN ASPART 100 UNIT/ML ~~LOC~~ SOLN
0.0000 [IU] | Freq: Three times a day (TID) | SUBCUTANEOUS | Status: DC
  Administered 2013-02-23: 3 [IU] via SUBCUTANEOUS
  Administered 2013-02-23: 4 [IU] via SUBCUTANEOUS
  Administered 2013-02-23: 08:00:00 via SUBCUTANEOUS
  Administered 2013-02-24: 7 [IU] via SUBCUTANEOUS
  Administered 2013-02-24 (×2): 4 [IU] via SUBCUTANEOUS
  Administered 2013-02-25: 3 [IU] via SUBCUTANEOUS
  Administered 2013-02-25: 7 [IU] via SUBCUTANEOUS
  Administered 2013-02-25: 4 [IU] via SUBCUTANEOUS
  Administered 2013-02-26: 7 [IU] via SUBCUTANEOUS
  Administered 2013-02-26: 4 [IU] via SUBCUTANEOUS
  Administered 2013-02-26 – 2013-02-27 (×2): 7 [IU] via SUBCUTANEOUS
  Administered 2013-02-27: 3 [IU] via SUBCUTANEOUS

## 2013-02-22 MED ORDER — INSULIN ASPART 100 UNIT/ML ~~LOC~~ SOLN
5.0000 [IU] | Freq: Three times a day (TID) | SUBCUTANEOUS | Status: DC
  Administered 2013-02-22 – 2013-02-27 (×15): 5 [IU] via SUBCUTANEOUS

## 2013-02-22 MED ORDER — INSULIN DETEMIR 100 UNIT/ML ~~LOC~~ SOLN
70.0000 [IU] | Freq: Two times a day (BID) | SUBCUTANEOUS | Status: DC
  Administered 2013-02-22 – 2013-02-24 (×5): 70 [IU] via SUBCUTANEOUS
  Filled 2013-02-22 (×7): qty 0.7

## 2013-02-22 NOTE — Progress Notes (Signed)
Family Medicine Teaching Service Daily Progress Note Service Pager: [phone]  Patient name: Jonathon Bailey Medical record number: [id number] Date of birth: [birth year] Age: 57 y.o. Gender: male  Primary Care Provider: Thersa Salt, DO Attending Physician: Zigmund Gottron, MD Consultants: Nephrology - Primary = Clover Mealy; DOW = FOX  CODE STATUS: FULL - addressed @ admission  Patient Description:  Jonathon Bailey is a 57 y.o. year old male with known CHF presenting with an acute CHF exacerbation, likely secondary to non-compliance with home lasix. No evidence of fluid overload on CXR, but does clinically appear fluid overloaded and has elevated pro BNP. Respiratory symptoms may also be due to COPD exacerbation given prominent wheezing on exam.     Subjective:  Reports feeling better.  Tolerating PO and RA.  Reports swelling improved.  Still some DOE but potentially feels good enough to go home.  Concerned that if he starts dialysis it is the beginning of the end.  Says that he will have nothing to look forward to.    OBJECTIVE  Vitals: Temp:  [97.8 F (36.6 C)-98.5 F (36.9 C)] 98.4 F (36.9 C) (06/01 0500) Pulse Rate:  [70-93] 70 (06/01 0500) Resp:  [18-20] 18 (06/01 0500) BP: (119-151)/(65-85) 139/80 mmHg (06/01 0500) SpO2:  [92 %-95 %] 95 % (06/01 0500) Weight:  [299 lb 14.4 oz (136.034 kg)] 299 lb 14.4 oz (136.034 kg) (06/01 0500)  Weight: Wt Readings from Last 3 Encounters:  02/22/13 299 lb 14.4 oz (136.034 kg)  01/13/13 307 lb (139.254 kg)  10/24/12 296 lb (134.265 kg)   Filed Weights   02/20/13 0552 02/21/13 0620 02/22/13 0500  Weight: 302 lb 11.1 oz (137.3 kg) 300 lb 4.3 oz (136.2 kg) 299 lb 14.4 oz (136.034 kg)    I&Os: Yesterday: 05/31 0701 - 06/01 0700 In: 1040 [P.O.:1040] Out: 2625 [Urine:2625] This shift:     PE: GENERAL:  ADULT AA  male. In no discomfort; no respiratory distress. PSYCH: Alert and appropriately interactive; Insight:Fair   Oriented x3.   H&N: AT/[place], trachea midline EENT:  MMM, no scleral icterus, EOMi HEART: RRR, S1/S2 heard, no murmur LUNGS: diffuse crackles on exam, no resp distress EXTREMITIES: Moves all 4 extremities spontaneously, warm well perfused, 2+ LE EDEMA; trace sacral edema UE VAS ACCESS:  Left distal arm: palpable thrill appreciated NEURO: hint of asterisix on exam but no overt flapping   LABS: CBC BMET   Recent Labs Lab 02/18/13 0710 02/20/13 0635 02/21/13 0459  WBC 8.7 13.1* 12.2*  HGB 9.8* 11.1* 10.9*  HCT 30.7* 34.6* 33.2*  PLT 219 246 262    Recent Labs Lab 02/20/13 0635 02/21/13 0440 02/22/13 0450  NA 134* 135 132*  K 4.4 4.2 4.3  CL 98 98 94*  CO2 21 23 20   BUN 92* 103* 116*  CREATININE 6.60* 6.86* 7.06*  GLUCOSE 326* 278* 334*  CALCIUM 9.8 9.5 9.3      Recent Labs Lab 02/20/13 0228 02/20/13 0545 02/20/13 1130 02/20/13 1645 02/20/13 1920 02/20/13 2324 02/21/13 0713 02/21/13 1115 02/21/13 1622 02/22/13 0610  GLUCAP 444* 334* 266* 444* 518* 443* 197* 213* 322* 293*    URINE STUDIES: None  MICRO: MRSA positive  IMAGING: Dg Chest 2 View  02/20/2013   *RADIOLOGY REPORT*  Clinical Data: Diminished breath sounds at the right lung base on physical examination.  CHEST - 2 VIEW  Comparison: Two-view chest x-ray 02/17/2013, 10/13/2012, 08/11/2012.  Findings: Cardiomediastinal silhouette unremarkable, unchanged. Mildly prominent bronchovascular markings diffusely and moderate central peribronchial  thickening, improved since examination 3 days ago.  No new pulmonary parenchymal abnormalities.  No pleural effusions.  Mild degenerative changes involving the thoracic spine.  IMPRESSION: Improvement in the acute bronchitis/asthma since the examination 3 days ago.  Baseline moderate chronic bronchitis/asthma.  No new/acute cardiopulmonary disease.   Original Report Authenticated By: Evangeline Dakin, M.D.    Medications:    . albuterol  2 puff Inhalation BID  .  atorvastatin  80 mg Oral q1800  . calcitRIOL  0.5 mcg Oral Daily  . carvedilol  12.5 mg Oral BID WC  . Chlorhexidine Gluconate Cloth  6 each Topical Q0600  . clopidogrel  75 mg Oral Daily  . darbepoetin (ARANESP) injection - NON-DIALYSIS  100 mcg Subcutaneous Q Wed-1800  . heparin  5,000 Units Subcutaneous Q8H  . hydrALAZINE  50 mg Oral TID  . insulin aspart  0-15 Units Subcutaneous TID WC  . insulin aspart  4 Units Subcutaneous TID WC  . insulin detemir  60 Units Subcutaneous BID  . isosorbide mononitrate  60 mg Oral Daily  . metolazone  5 mg Oral Daily  . mupirocin ointment   Nasal BID  . sodium chloride  3 mL Intravenous Q12H  . tiotropium  18 mcg Inhalation Daily  . torsemide  100 mg Oral BID   sodium chloride, acetaminophen, ondansetron (ZOFRAN) IV, polyethylene glycol, sodium chloride  Assessment & Plan  LOS: 58 57 y.o. year old male with known CHF presenting with an acute CHF exacerbation, likely secondary to non-compliance with home lasix. No evidence of fluid overload on CXR, but does clinically appear fluid overloaded and has elevated pro BNP. Respiratory symptoms may also be due to COPD exacerbation given prominent wheezing on exam.    # Exacerbation of acute on chronic combined systolic and diastolic CHF: BNP elevated on admission. Last echo was January 2014 and noted EF 40-45%, moderate hypokinesis of lateral myocardium, grade 2 diastolic dysfunction. Dry weight per pt is 295lb.  Trop negative x 3.  - continue to monitor on tele - contTorsemide 100mg  BID. Some psychologic benefit from pt standpoint from having on torsemide as pt feels certain that lasix doesn't work. Continues to have good UOP but crackles present on exam [ ]  check ambulatory pulse ox - strict I&O  - daily weights    # Hyperkalemia: - K remains normal today - will be sure to stop K supplementation upon discharge  # CKD - : has fistula placed but not on dialysis. Baseline creatinine poor.  Has been  followed by Dr. Clover Mealy in past and has been recommended to start Dialysis in past but pt vehemently against it.  Discussed with him today the potential benefits of dialysis especially in regards to BP control and volume control.  Pt is more aggreable today than in past and reports would consider if Nephrology agrees it is time.  Pt is concerned because he is not currently a candidate for renal transplant and had questions regarding what would need to be done become an appropriate candidate.    # Hypertension: Fairly well controlled, but requires multilple agents. Since admission decreased carvedilol dose to 12.5mg  BID in setting of acute CHF exacerbation with hx of CAD  - continue home hydralazine, imdur, and carvedilol as above.  - control would benefit from dialysis  # COPD: On RA w/ NWB. Recommend PFT outpt.  - continue home spiriva  - continue albuterol - prednisone 40mg  daily for possible exacerbation   # Diabetes: takes levemir 75 BID  at home, but compliance is a question. Hgb A1c 8.9 upon admission.   - continue levemir with SSI - Will increase to 70 units BID - meal time insulin with Novolog 5units - will need close outpatient f/u  # CAD s/p MI with stents:  - continue plavix, imdur   # Sleep apnea:  - CPAP while hospitalized   # HLD: continue statin   --- FEN  *SL IV -RENAL DIET - some reported outside foods --- PPx: Heparin   Disposition  Pt with continued worsened BUN/Cr.  Hint of Asterisix on exam.  Discussed with pt the benefits of Dialysis and strongly encouraged him to consider if Nephrology feels it is appropriate.  Pt continues to make good urine and is progressing but given chronic underlying difficult to control HTN and volume status I believe that dialysis would be not only appropriate but significantly beneficial to this pt's quality of life in spite weekly dialysis requirements.  If pt is not going to be starting dialysis could consider discharge tomorrow if  stabilization of labs overnight.    Gerda Diss, DO [facility] Resident - PGY-2 02/22/2013 9:04 AM

## 2013-02-22 NOTE — Progress Notes (Signed)
Seen and examined.  He is near his dry weight.  He has no overt signs of uremia.  Volume is OK control.  Renal function worse.  I favor discharge on current meds.  When he comes back (not if he comes back), he will need dialysis.  Alternatively, we could start dialysis now.  Await renal input.

## 2013-02-22 NOTE — Progress Notes (Signed)
Pt. States he can place cpap on himself. Pt. Wears cpap at home and has been placing cpap on himself. RT informed pt. To notify if he needs any assistance.

## 2013-02-22 NOTE — Progress Notes (Signed)
[facility]  Subjective:  Feels tired, coughing less On max dose IV torsemide and 5 metolazone   Objective: Vital signs in last 24 hours: Blood pressure 131/72, pulse 90, temperature 98.5 F (36.9 C), temperature source Oral, resp. rate 18, height 6' (1.829 m), weight 136.034 kg (299 lb 14.4 oz), SpO2 94.00%.    PHYSICAL EXAM General--awake, alert, lying flat in bed.  Chest--no rales, + exp wheeze  Heart--no rub  Abd--protuberant, nontender  Extr--1+ edema, small AVF patent L FA  Weight  Date  27 May   145.5 kg  28 May  142.2  29 May  139.6  30 May  137.3  31 May  136.2   1 Jun  136.0   Lab Results:   Recent Labs Lab 02/20/13 [phone] 02/21/13 0440 02/22/13 0450  NA 134* 135 132*  K 4.4 4.2 4.3  CL 98 98 94*  CO2 21 23 20   BUN 92* 103* 116*  CREATININE 6.60* 6.86* 7.06*  GLUCOSE 326* 278* 334*  CALCIUM 9.8 9.5 9.3  PHOS 5.6* 5.6* 5.5*     Recent Labs  02/20/13 0635 02/21/13 0459  WBC 13.1* 12.2*  HGB 11.1* 10.9*  HCT 34.6* 33.2*  PLT 246 262     I have reviewed the patient's current medications. Scheduled: . albuterol  2 puff Inhalation BID  . atorvastatin  80 mg Oral q1800  . calcitRIOL  0.5 mcg Oral Daily  . carvedilol  12.5 mg Oral BID WC  . Chlorhexidine Gluconate Cloth  6 each Topical Q0600  . clopidogrel  75 mg Oral Daily  . darbepoetin (ARANESP) injection - NON-DIALYSIS  100 mcg Subcutaneous Q Wed-1800  . heparin  5,000 Units Subcutaneous Q8H  . hydrALAZINE  50 mg Oral TID  . insulin aspart  0-15 Units Subcutaneous TID WC  . insulin aspart  5 Units Subcutaneous TID WC  . insulin detemir  70 Units Subcutaneous BID  . isosorbide mononitrate  60 mg Oral Daily  . metolazone  5 mg Oral Daily  . mupirocin ointment   Nasal BID  . sodium chloride  3 mL Intravenous Q12H  . tiotropium  18 mcg Inhalation Daily  . torsemide  100 mg Oral BID    Assessment/Plan: 1.Renal- advanced CKD at baseline. His creatinine today is 7.06  (it  was 6.6 when he saw Dr. Jimmy Footman last month).  On max IV diuretics.  I don't think L FA AVF will work--but we'll try it first. If it doesn't work tomorrow,then [facility] can be placed  2. Hypertension/volume - patient is still volume overloaded. BP 131/72 today  3. Anemia - likely mostly related to CKD. There are iron stores from April which reveal an iron saturation of 28. 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 Hgb 10.9 today  4. Hyperkalemia- K 4.3 today on no supplemental K  5. Bones- PTH 369, Phosphorus 5.5. Will increase calcitriol to 0.5/day     LOS: 5 days   Jonathon Bailey F 02/22/2013,2:15 PM   .labalb

## 2013-02-23 ENCOUNTER — Inpatient Hospital Stay: Payer: Medicare Other | Admitting: Family Medicine

## 2013-02-23 LAB — GLUCOSE, CAPILLARY
Glucose-Capillary: 262 mg/dL — ABNORMAL HIGH (ref 70–99)
Glucose-Capillary: 159 mg/dL — ABNORMAL HIGH (ref 70–99)
Glucose-Capillary: 138 mg/dL — ABNORMAL HIGH (ref 70–99)
Glucose-Capillary: 175 mg/dL — ABNORMAL HIGH (ref 70–99)
Glucose-Capillary: 256 mg/dL — ABNORMAL HIGH (ref 70–99)

## 2013-02-23 LAB — RENAL FUNCTION PANEL
Albumin: 3.1 g/dL — ABNORMAL LOW (ref 3.5–5.2)
Calcium: 9.4 mg/dL (ref 8.4–10.5)
GFR calc Af Amer: 8 mL/min — ABNORMAL LOW (ref >90)
GFR calc non Af Amer: 7 mL/min — ABNORMAL LOW (ref >90)
Phosphorus: 7 mg/dL — ABNORMAL HIGH (ref 2.3–4.6)
Sodium: 134 mEq/L — ABNORMAL LOW (ref 135–145)

## 2013-02-23 MED ORDER — SEVELAMER CARBONATE 800 MG PO TABS
1600.0000 mg | ORAL_TABLET | Freq: Three times a day (TID) | ORAL | Status: DC
  Administered 2013-02-23 – 2013-02-27 (×11): 1600 mg via ORAL
  Filled 2013-02-23 (×17): qty 2

## 2013-02-23 NOTE — Progress Notes (Signed)
Family Medicine Teaching Service Attending Note  I interviewed and examined patient Jonathon Bailey and reviewed their tests and x-rays.  I discussed with Dr. Ardelia Mems and reviewed their note for today.  I agree with their assessment and plan.     Additionally  Anxious about dialysis Seems fairly well fluid controlled Will proceed as per renal

## 2013-02-23 NOTE — Progress Notes (Signed)
Family Medicine Teaching Service Daily Progress Note Service Page: [phone]  Patient Assessment: Jonathon Bailey is a 57 y.o. year old male with known CHF presenting with an acute CHF exacerbation, likely secondary to non-compliance with home lasix. No evidence of fluid overload on CXR, but does clinically appear fluid overloaded and has elevated pro BNP. Respiratory symptoms may also be due to COPD exacerbation given prominent wheezing on exam.   Subjective: Has not yet gotten dialysis today. Has no questions or complaints this morning.  Objective: Temp:  [98.2 F (36.8 C)-98.5 F (36.9 C)] 98.2 F (36.8 C) (06/02 0640) Pulse Rate:  [76-90] 76 (06/02 0640) Resp:  [18] 18 (06/02 0640) BP: (112-137)/(67-79) 112/79 mmHg (06/02 0640) SpO2:  [92 %-95 %] 94 % (06/02 0826) Weight:  [296 lb 6.4 oz (134.446 kg)] 296 lb 6.4 oz (134.446 kg) (06/02 0640)  Exam: General: NAD, sitting up in chair Cardiovascular: RRR, no m/r/g Respiratory: diminished breath sounds at b/l bases, nml effort on RA Abdomen: remains distended but nontender to palpation, slightly improved however Extremities: 1+ lower extremity edema in bilateral lower extremities   Filed Weights   02/21/13 0620 02/22/13 0500 02/23/13 0640  Weight: 300 lb 4.3 oz (136.2 kg) 299 lb 14.4 oz (136.034 kg) 296 lb 6.4 oz (134.446 kg)    I have reviewed the patient's medications, labs, imaging, and diagnostic testing.  Notable results are summarized below.  Medications:  Scheduled Meds: . albuterol  2 puff Inhalation BID  . atorvastatin  80 mg Oral q1800  . calcitRIOL  0.5 mcg Oral Daily  . carvedilol  12.5 mg Oral BID WC  . Chlorhexidine Gluconate Cloth  6 each Topical Q0600  . clopidogrel  75 mg Oral Daily  . darbepoetin (ARANESP) injection - NON-DIALYSIS  100 mcg Subcutaneous Q Wed-1800  . heparin  5,000 Units Subcutaneous Q8H  . hydrALAZINE  50 mg Oral TID  . insulin aspart  0-20 Units Subcutaneous TID WC  . insulin aspart   0-5 Units Subcutaneous QHS  . insulin aspart  5 Units Subcutaneous TID WC  . insulin detemir  70 Units Subcutaneous BID  . isosorbide mononitrate  60 mg Oral Daily  . metolazone  5 mg Oral Daily  . mupirocin ointment   Nasal BID  . sodium chloride  3 mL Intravenous Q12H  . tiotropium  18 mcg Inhalation Daily  . torsemide  100 mg Oral BID   Continuous Infusions:  PRN Meds:.sodium chloride, acetaminophen, ondansetron (ZOFRAN) IV, polyethylene glycol, sodium chloride  Labs:  CBC  Recent Labs Lab 02/18/13 0710 02/20/13 0635 02/21/13 0459  WBC 8.7 13.1* 12.2*  HGB 9.8* 11.1* 10.9*  HCT 30.7* 34.6* 33.2*  PLT 219 246 262    BMET  Recent Labs Lab 02/21/13 0440 02/22/13 0450 02/23/13 0420  NA 135 132* 134*  K 4.2 4.3 3.8  CL 98 94* 94*  CO2 23 20 24   BUN 103* 116* 133*  CREATININE 6.86* 7.06* 7.59*  GLUCOSE 278* 334* 207*  CALCIUM 9.5 9.3 9.4   A1c 8.9  CBG (last 3)   Recent Labs  02/22/13 1637 02/22/13 2148 02/23/13 0636  GLUCAP 233* 262* 159*  Sliding scale: 27 units in last 24 hours  Urine output net 11.5L down since admission Weight 296 (dry weight 295)   Assessment/Plan:  Jonathon Bailey is a 57 y.o. year old male with known CHF presenting with an acute CHF exacerbation, likely secondary to non-compliance with home lasix. No evidence of fluid overload  on CXR, but does clinically appear fluid overloaded and has elevated pro BNP. Respiratory symptoms may also be due to COPD exacerbation given prominent wheezing on exam.   # Exacerbation of acute on chronic combined systolic and diastolic CHF: BNP elevated on admission. Last echo was January 2014 and noted EF 40-45%, moderate hypokinesis of lateral myocardium, grade 2 diastolic dysfunction. Dry weight per pt is 295lb. Trop negative x 3.  - continue to monitor on tele  - cont Torsemide 100mg  BID and metolazone. Some psychologic benefit from pt standpoint from having on torsemide as pt feels certain that  lasix doesn't work. Continues to have good UOP, no crackles present on exam  [ ]  check ambulatory pulse ox  - strict I&O  - daily weights   # Hyperkalemia: - K remains normal today - will be sure to stop K supplementation upon discharge  # CKD: has fistula placed but not on dialysis. Baseline creatinine unclear, was 6-7 in January but that was in setting of acute CHF exacerbation. Prior to that admission was 3-4. 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 Cr elevated since admission likely secondary to diuresis. Appreciate Renal recommendations - renal planning to do dialysis today - Diuresis as above  - Calcium and phos per renal  # Hypertension: Fairly well controlled, but requires multilple agents. Since admission decreased carvedilol dose to 12.5mg  BID in setting of acute CHF exacerbation with hx of CAD  - continue home hydralazine, imdur, and carvedilol as above.  - likely to continue to improve with dialysis  # COPD: On RA w/ NWB. Recommend PFT outpt.  - continue home spiriva  - continue albuterol - prednisone 40mg  daily for possible exacerbation - has completed course  # Diabetes: takes levemir 75 BID at home, but compliance is a question. Hgb A1c 8.9 upon admission - continue levemir with SSI. currently on 70 units BID, anticipate need to resume home regimen (75 BID) - mealtime insulin with novolog 5 units - will need close outpatient f/u  # CAD s/p MI with stents:  - continue plavix, imdur   # Sleep apnea:  - CPAP while hospitalized   # HLD: continue statin   # FEN/GI:  - renal diet - apparently eating some outside foods - SLIV  # Prophylaxis:  - heparin SQ   # Dispo:  - pending clinical improvement and establishing diuretic regimen  # Code Status: discussed with patient on admission, he desires to be full code.  Chrisandra Netters, MD Family Medicine PGY-1 Service Pager [phone]

## 2013-02-23 NOTE — Progress Notes (Signed)
Patient ID: Jonathon Bailey, male   DOB: August 22, 1956, 57 y.o.   MRN: [id number] S: seen on HD. No new complaints O:BP 116/71  Pulse 99  Temp(Src) 98.1 F (36.7 C) (Oral)  Resp 17  Ht 6' (1.829 m)  Wt 134.446 kg (296 lb 6.4 oz)  BMI 40.19 kg/m2  SpO2 95%  Intake/Output Summary (Last 24 hours) at 02/23/13 1401 Last data filed at 02/23/13 1313  Gross per 24 hour  Intake    680 ml  Output   2450 ml  Net  -1770 ml   Intake/Output: I/O last 3 completed shifts: In: [id number] [P.O.:1040; I.V.:3] Out: 3525 [Urine:3525]  Intake/Output this shift:  Total I/O In: 240 [P.O.:240] Out: 725 [Urine:725] Weight change: -1.588 kg (-3 lb 8 oz) Gen:WD WN obese AAM in NAD CVS:no rub Resp:cta [id number] Ext:+pretib edema, LFA AVF +T/B   Recent Labs Lab 02/17/13 1226 02/17/13 2008 02/18/13 0710 02/19/13 0400 02/20/13 0635 02/21/13 0440 02/22/13 0450 02/23/13 0420  NA 136  --  137 135 134* 135 132* 134*  K 5.1  --  5.3* 4.7 4.4 4.2 4.3 3.8  CL 107  --  106 101 98 98 94* 94*  CO2 16*  --  18* 17* 21 23 20 24   GLUCOSE 254*  --  333* 304* 326* 278* 334* 207*  BUN 69*  --  75* 83* 92* 103* 116* 133*  CREATININE 5.75* 6.03* 6.04* 6.26* 6.60* 6.86* 7.06* 7.59*  ALBUMIN  --   --  2.9*  --  3.3* 3.2* 3.0* 3.1*  CALCIUM 9.1  --  9.1 9.5 9.8 9.5 9.3 9.4  PHOS  --   --  3.4  --  5.6* 5.6* 5.5* 7.0*   Liver Function Tests:  Recent Labs Lab 02/21/13 0440 02/22/13 0450 02/23/13 0420  ALBUMIN 3.2* 3.0* 3.1*   No results found for this basename: LIPASE, AMYLASE,  in the last 168 hours No results found for this basename: AMMONIA,  in the last 168 hours CBC:  Recent Labs Lab 02/17/13 1226 02/17/13 2008 02/18/13 0710 02/20/13 0635 02/21/13 0459  WBC 7.4 9.1 8.7 13.1* 12.2*  HGB 9.3* 9.8* 9.8* 11.1* 10.9*  HCT 29.5* 31.1* 30.7* 34.6* 33.2*  MCV 85.5 85.4 84.3 84.4 83.2  PLT 197 208 219 246 262   Cardiac Enzymes:  Recent Labs Lab 02/17/13 1334 02/17/13 2000 02/18/13 0150  02/18/13 0710  TROPONINI <0.30 <0.30 <0.30 <0.30   CBG:  Recent Labs Lab 02/22/13 1114 02/22/13 1637 02/22/13 2148 02/23/13 0636 02/23/13 1145  GLUCAP 255* 233* 262* 159* 138*    Iron Studies: No results found for this basename: IRON, TIBC, TRANSFERRIN, FERRITIN,  in the last 72 hours Studies/Results: No results found. Marland Kitchen albuterol  2 puff Inhalation BID  . atorvastatin  80 mg Oral q1800  . calcitRIOL  0.5 mcg Oral Daily  . carvedilol  12.5 mg Oral BID WC  . Chlorhexidine Gluconate Cloth  6 each Topical Q0600  . clopidogrel  75 mg Oral Daily  . darbepoetin (ARANESP) injection - NON-DIALYSIS  100 mcg Subcutaneous Q Wed-1800  . heparin  5,000 Units Subcutaneous Q8H  . hydrALAZINE  50 mg Oral TID  . insulin aspart  0-20 Units Subcutaneous TID WC  . insulin aspart  0-5 Units Subcutaneous QHS  . insulin aspart  5 Units Subcutaneous TID WC  . insulin detemir  70 Units Subcutaneous BID  . isosorbide mononitrate  60 mg Oral Daily  . metolazone  5 mg Oral Daily  .  mupirocin ointment   Nasal BID  . sodium chloride  3 mL Intravenous Q12H  . tiotropium  18 mcg Inhalation Daily  . torsemide  100 mg Oral BID    BMET    Component Value Date/Time   NA 134* 02/23/2013 0420   K 3.8 02/23/2013 0420   CL 94* 02/23/2013 0420   CO2 24 02/23/2013 0420   GLUCOSE 207* 02/23/2013 0420   BUN 133* 02/23/2013 0420   CREATININE 7.59* 02/23/2013 0420   CREATININE 7.17* 10/23/2012 1554   CALCIUM 9.4 02/23/2013 0420   GFRNONAA 7* 02/23/2013 0420   GFRAA 8* 02/23/2013 0420   CBC    Component Value Date/Time   WBC 12.2* 02/21/2013 0459   RBC 3.99* 02/21/2013 0459   HGB 10.9* 02/21/2013 0459   HCT 33.2* 02/21/2013 0459   PLT 262 02/21/2013 0459   MCV 83.2 02/21/2013 0459   MCH 27.3 02/21/2013 0459   MCHC 32.8 02/21/2013 0459   RDW 14.3 02/21/2013 0459   LYMPHSABS 1.2 10/11/2012 1256   MONOABS 0.9 10/11/2012 1256   EOSABS 0.1 10/11/2012 1256   BASOSABS 0.0 10/11/2012 1256     Assessment/Plan:  1. AKI/CKD- now  ESRD. For first HD today. Will plan qod due to new AVF. Have sent out information for outpt HD 2. Acute on chronic diastolic CHF- for HD with UF today 3. 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 COPD- per primary svc 5. OSA 6. SHPTH- on vit D. Will need to start binders.  renvela 800mg  2 qac and follow 7. Vascular access- LFA AVF +T/B.  [place] A

## 2013-02-23 NOTE — Clinical Documentation Improvement (Signed)
GENERIC DOCUMENTATION CLARIFICATION QUERY   CLINICAL DOCUMENTATION QUERIES ARE NOT PART OF THE PERMANENT MEDICAL RECORD   Please update your documentation within the medical record to reflect your response to this query.                                                                                         02/23/13   Dr. Ardelia Mems,   In a better effort to capture your patient's severity of illness, reflect appropriate length of stay and utilization of resources, a review of the patient medical record has revealed the following indicators:    - Known history of Hypertension.   - Cardiomegaly by CXR 02/17/13.   - Moderate Left Ventricular Hypertrophy by Echo 10/14/12.   - Known Systolic and Diastolic Heart Failure per progress notes.    Based on your clinical judgment, please document in the progress notes and discharge summary if the patient has:     - Hypertensive Heart Disease related to his history of HTN, Cardiomegaly, and Diastolic Dysfunction    - Other Condition    - Unable to Clinically Determine   In responding to this query please exercise your independent judgment.     The fact that a query is asked, does not imply that any particular answer is desired or expected.   Reviewed:  no additional documentation provided  Thank You,  Erling Conte  RN BSN CCDS Certified Clinical Documentation Specialist: Cell   [phone]  Health Information Management [place]  TO RESPOND TO THE THIS QUERY, FOLLOW THE INSTRUCTIONS BELOW:  1. If needed, update documentation for the patient's encounter via the notes activity.  2. Access this query again and click edit on the In [employer].  3. After updating, or not, click F2 to complete all highlighted (required) fields concerning your review. Select "additional documentation in the medical record" OR "no additional documentation provided".  4. Click Sign note button.  5. The deficiency will fall out of your  In Basket *Please let us know if you are not able to complete this workflow by phone or e-mail (listed below).

## 2013-02-23 NOTE — Progress Notes (Signed)
Pt. States he can place the cpap on himself when he is ready for bed. RT informed pt. To notify if he needs any assistance.

## 2013-02-23 NOTE — Procedures (Signed)
Patient was seen on dialysis and the procedure was supervised. BFR 150 Via LFA AVF BP is 110/62.  Patient appears to be tolerating treatment well

## 2013-02-24 LAB — RENAL FUNCTION PANEL
Albumin: 3 g/dL — ABNORMAL LOW (ref 3.5–5.2)
BUN: 103 mg/dL — ABNORMAL HIGH (ref 6–23)
CO2: 24 mEq/L (ref 19–32)
Chloride: 94 mEq/L — ABNORMAL LOW (ref 96–112)
Potassium: 4 mEq/L (ref 3.5–5.1)

## 2013-02-24 LAB — GLUCOSE, CAPILLARY
Glucose-Capillary: 155 mg/dL — ABNORMAL HIGH (ref 70–99)
Glucose-Capillary: 240 mg/dL — ABNORMAL HIGH (ref 70–99)
Glucose-Capillary: 281 mg/dL — ABNORMAL HIGH (ref 70–99)

## 2013-02-24 NOTE — Progress Notes (Signed)
Patient appeared a little melancholy this evening and wanted to nurse to explain reason of why he really needs dialysis. Explained and educated patient about lab values of his kidney and what it meant and the importance of the need for dialysis and patient began to have a better understanding. Patient had no further questions or complaints. Will continue to monitor to end of shift. Nurse signing off.

## 2013-02-24 NOTE — Progress Notes (Signed)
Patient not ready to go on CPAP at this time.  Patient stated that he would put himself on CPAP when he is ready

## 2013-02-24 NOTE — Progress Notes (Signed)
Family Medicine Teaching Service Attending Note  I interviewed and examined patient Jonathon Bailey and reviewed their tests and x-rays.  I discussed with Dr. Venetia Maxon and reviewed their note for today.  I agree with their assessment and plan.     Additionally  Feeling slightly better today Tolerated HD well Proceed as per renal Cut down diuretics

## 2013-02-24 NOTE — Progress Notes (Signed)
Family Medicine Teaching Service Daily Progress Note Service Page: [phone]  Patient Assessment: Jonathon Bailey is a 57 y.o. year old male with known CHF presenting with an acute CHF exacerbation, likely secondary to non-compliance with home lasix. No evidence of fluid overload on CXR, but does clinically appear fluid overloaded and has elevated pro BNP. Respiratory symptoms may also be due to COPD exacerbation given prominent wheezing on exam.   Subjective: Seen at bedside, no acute events overnight. No questions or complaints this morning, feels "okay" and believes swelling in legs is slightly better. Planning for dialysis tomorrow. Very interested in going home.  Objective: Temp:  [96.9 F (36.1 C)-98.1 F (36.7 C)] 96.9 F (36.1 C) (06/03 0649) Pulse Rate:  [94-110] 94 (06/03 1202) Resp:  [11-26] 20 (06/03 0649) BP: (87-159)/(59-89) 126/78 mmHg (06/03 1202) SpO2:  [93 %-97 %] 96 % (06/03 0858) Weight:  [293 lb 11.2 oz (133.221 kg)-295 lb 6.7 oz (134 kg)] 293 lb 11.2 oz (133.221 kg) (06/03 [id number])  Exam: General: NAD, sitting up in chair on RA, alert/oriented Cardiovascular: RRR, no murmur appreciated Respiratory: diminished breath sounds at b/l bases (?2/2 habitus), normal effort Abdomen: remains distended, nontender to palpation, BS+ Extremities: remaisn with 1+ lower extremity edema in bilateral lower extremities  Filed Weights   02/23/13 1625 02/24/13 0500 02/24/13 0649  Weight: 294 lb 5 oz (133.5 kg) 293 lb 11.2 oz (133.221 kg) 293 lb 11.2 oz (133.221 kg)    I have reviewed the patient's medications, labs, imaging, and diagnostic testing.  Notable results are summarized below.  Medications:  Scheduled Meds: . albuterol  2 puff Inhalation BID  . atorvastatin  80 mg Oral q1800  . calcitRIOL  0.5 mcg Oral Daily  . carvedilol  12.5 mg Oral BID WC  . Chlorhexidine Gluconate Cloth  6 each Topical Q0600  . clopidogrel  75 mg Oral Daily  . darbepoetin (ARANESP) injection -  NON-DIALYSIS  100 mcg Subcutaneous Q Wed-1800  . heparin  5,000 Units Subcutaneous Q8H  . hydrALAZINE  50 mg Oral TID  . insulin aspart  0-20 Units Subcutaneous TID WC  . insulin aspart  0-5 Units Subcutaneous QHS  . insulin aspart  5 Units Subcutaneous TID WC  . insulin detemir  70 Units Subcutaneous BID  . isosorbide mononitrate  60 mg Oral Daily  . mupirocin ointment   Nasal BID  . sevelamer carbonate  1,600 mg Oral TID WC  . sodium chloride  3 mL Intravenous Q12H  . tiotropium  18 mcg Inhalation Daily   Continuous Infusions:  PRN Meds:.sodium chloride, acetaminophen, ondansetron (ZOFRAN) IV, polyethylene glycol, sodium chloride  Labs:  CBC  Recent Labs Lab 02/18/13 0710 02/20/13 0635 02/21/13 0459  WBC 8.7 13.1* 12.2*  HGB 9.8* 11.1* 10.9*  HCT 30.7* 34.6* 33.2*  PLT 219 246 262    BMET  Recent Labs Lab 02/22/13 0450 02/23/13 0420 02/24/13 0500  NA 132* 134* 133*  K 4.3 3.8 4.0  CL 94* 94* 94*  CO2 20 24 24   BUN 116* 133* 103*  CREATININE 7.06* 7.59* 6.44*  GLUCOSE 334* 207* 180*  CALCIUM 9.3 9.4 9.1   A1c 8.9  CBG (last 3)   Recent Labs  02/23/13 2127 02/24/13 0653 02/24/13 1145  GLUCAP 256* 155* 161*  Sliding scale: 27 units in last 24 hours  Urine output net 11.5L down since admission Weight 296 (dry weight 295)   Assessment/Plan:  Jonathon Bailey is a 57 y.o. year old male with  known CHF initially presented with an acute CHF exacerbation, likely secondary to non-compliance with home lasix. No evidence of fluid overload on CXR on admission, but did clinically appear fluid overloaded and has elevated pro BNP. Respiratory symptoms may also be due to COPD exacerbation given prominent wheezing on exam at presentation.   # Exacerbation of acute on chronic combined systolic and diastolic CHF: BNP elevated on admission. Last echo was January 2014 and noted EF 40-45%, moderate hypokinesis of lateral myocardium, grade 2 diastolic dysfunction. Dry  weight per pt is 295lb. Trop negative x 3.  - continue to monitor on tele  - discontinued Torsemide 100mg  BID and metolazone; will need to confirm with nephrology any further medication recommendations.  -Some psychologic benefit from pt standpoint from having on torsemide (pt feels certain that lasix doesn't work)  -good UOP with torsemide and metolazone over last 2 days, no crackles present on exam  - will need to check ambulatory pulse ox prior to discharge - strict I&O, daily weights   # Hyperkalemia: - K remains normal - will be sure to stop K supplementation upon discharge  # CKD: has fistula placed but not on dialysis. Baseline creatinine unclear, was 6-7 in January but that was in setting of acute CHF exacerbation. Prior to that admission was 3-4. 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 Cr elevated since admission likely secondary to diuresis. Appreciate Renal recommendations - continue dialysis as above per nephrology; pt being set up for outpt dialysis - Calcium and phos per renal  # Hypertension: Fairly well controlled, but requires multilple agents. Since admission decreased carvedilol dose to 12.5mg  BID in setting of acute CHF exacerbation with hx of CAD  - continue home hydralazine, imdur, and carvedilol as above.  - likely to continue to improve with dialysis; will adjust medications as appropriate  # COPD: On RA w/ NWB. Recommend PFT outpt.  - continue home spiriva  - continue albuterol - prednisone 40mg  daily for possible exacerbation - has completed course  # Diabetes: takes levemir 75 BID at home, but compliance is a question. Hgb A1c 8.9 upon admission - continue levemir with SSI. currently on 70 units BID, anticipate need to resume home regimen (75 BID) - mealtime insulin with novolog 5 units - will need close outpatient f/u  # CAD s/p MI with stents:  - continue plavix, imdur   # Sleep apnea:  - CPAP while hospitalized   # HLD: continue statin   #  FEN/GI: renal diet, (apparently eating some outside foods); SLIV # Prophylaxis: heparin SQ  # Dispo: management as above; discharge planning pending outpt dialysis, medication changes, etc # Code Status: discussed with patient on admission, he desires to be full code.  Street, Harrell Gave, MD [facility] Medicine PGY-1 Service Pager [phone]

## 2013-02-24 NOTE — Progress Notes (Signed)
Patient ID: ARA REAGER, male   DOB: 1956-01-19, 57 y.o.   MRN: [id number] S:no new complaints O:BP 126/78  Pulse 94  Temp(Src) 96.9 F (36.1 C) (Oral)  Resp 20  Ht 6' (1.829 m)  Wt 133.221 kg (293 lb 11.2 oz)  BMI 39.82 kg/m2  SpO2 96%  Intake/Output Summary (Last 24 hours) at 02/24/13 1345 Last data filed at 02/24/13 1321  Gross per 24 hour  Intake    943 ml  Output   1900 ml  Net   -957 ml   Intake/Output: I/O last 3 completed shifts: In: 483 [P.O.:480; I.V.:3] Out: 3125 [Urine:2525; Other:600]  Intake/Output this shift:  Total I/O In: 700 [P.O.:700] Out: 600 [Urine:600] Weight change: -0.446 kg (-15.7 oz) Gen:WD WN obese AAM in NAD CVS:no rub Resp:decreased BS at bases [id number], NT [id number] pedal edema, LFA AVF +T/B   Recent Labs Lab 02/18/13 0710 02/19/13 0400 02/20/13 0635 02/21/13 0440 02/22/13 0450 02/23/13 0420 02/24/13 0500  NA 137 135 134* 135 132* 134* 133*  K 5.3* 4.7 4.4 4.2 4.3 3.8 4.0  CL 106 101 98 98 94* 94* 94*  CO2 18* 17* 21 23 20 24 24   GLUCOSE 333* 304* 326* 278* 334* 207* 180*  BUN 75* 83* 92* 103* 116* 133* 103*  CREATININE 6.04* 6.26* 6.60* 6.86* 7.06* 7.59* 6.44*  ALBUMIN 2.9*  --  3.3* 3.2* 3.0* 3.1* 3.0*  CALCIUM 9.1 9.5 9.8 9.5 9.3 9.4 9.1  PHOS 3.4  --  5.6* 5.6* 5.5* 7.0* 6.5*   Liver Function Tests:  Recent Labs Lab 02/22/13 0450 02/23/13 0420 02/24/13 0500  ALBUMIN 3.0* 3.1* 3.0*   No results found for this basename: LIPASE, AMYLASE,  in the last 168 hours No results found for this basename: AMMONIA,  in the last 168 hours CBC:  Recent Labs Lab 02/17/13 2008 02/18/13 0710 02/20/13 0635 02/21/13 0459  WBC 9.1 8.7 13.1* 12.2*  HGB 9.8* 9.8* 11.1* 10.9*  HCT 31.1* 30.7* 34.6* 33.2*  MCV 85.4 84.3 84.4 83.2  PLT 208 219 246 262   Cardiac Enzymes:  Recent Labs Lab 02/17/13 2000 02/18/13 0150 02/18/13 0710  TROPONINI <0.30 <0.30 <0.30   CBG:  Recent Labs Lab 02/23/13 1145 02/23/13 1716  02/23/13 2127 02/24/13 0653 02/24/13 1145  GLUCAP 138* 175* 256* 155* 161*    Iron Studies: No results found for this basename: IRON, TIBC, TRANSFERRIN, FERRITIN,  in the last 72 hours Studies/Results: No results found. Marland Kitchen albuterol  2 puff Inhalation BID  . atorvastatin  80 mg Oral q1800  . calcitRIOL  0.5 mcg Oral Daily  . carvedilol  12.5 mg Oral BID WC  . Chlorhexidine Gluconate Cloth  6 each Topical Q0600  . clopidogrel  75 mg Oral Daily  . darbepoetin (ARANESP) injection - NON-DIALYSIS  100 mcg Subcutaneous Q Wed-1800  . heparin  5,000 Units Subcutaneous Q8H  . hydrALAZINE  50 mg Oral TID  . insulin aspart  0-20 Units Subcutaneous TID WC  . insulin aspart  0-5 Units Subcutaneous QHS  . insulin aspart  5 Units Subcutaneous TID WC  . insulin detemir  70 Units Subcutaneous BID  . isosorbide mononitrate  60 mg Oral Daily  . mupirocin ointment   Nasal BID  . sevelamer carbonate  1,600 mg Oral TID WC  . sodium chloride  3 mL Intravenous Q12H  . tiotropium  18 mcg Inhalation Daily    BMET    Component Value Date/Time   NA 133* 02/24/2013 0500  K 4.0 02/24/2013 0500   CL 94* 02/24/2013 0500   CO2 24 02/24/2013 0500   GLUCOSE 180* 02/24/2013 0500   BUN 103* 02/24/2013 0500   CREATININE 6.44* 02/24/2013 0500   CREATININE 7.17* 10/23/2012 1554   CALCIUM 9.1 02/24/2013 0500   GFRNONAA 9* 02/24/2013 0500   GFRAA 10* 02/24/2013 0500   CBC    Component Value Date/Time   WBC 12.2* 02/21/2013 0459   RBC 3.99* 02/21/2013 0459   HGB 10.9* 02/21/2013 0459   HCT 33.2* 02/21/2013 0459   PLT 262 02/21/2013 0459   MCV 83.2 02/21/2013 0459   MCH 27.3 02/21/2013 0459   MCHC 32.8 02/21/2013 0459   RDW 14.3 02/21/2013 0459   LYMPHSABS 1.2 10/11/2012 1256   MONOABS 0.9 10/11/2012 1256   EOSABS 0.1 10/11/2012 1256   BASOSABS 0.0 10/11/2012 1256     Assessment/Plan:  1. AKI/CKD- now ESRD. Had first HD 02/23/13. Will plan qod due to new AVF. Have sent out information for outpt HD 2. Acute on chronic diastolic  CHF- for HD with UF again tomorrow 3. 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 COPD- per primary svc 5. OSA 6. SHPTH- on vit D. Will need to start binders. renvela 800mg  2 qac and follow 7. Vascular access- LFA AVF +T/B. 8. Dispo- awaiting outpt HD spot.  [place] A

## 2013-02-24 NOTE — Progress Notes (Signed)
Utilization Review Completed Nochum Fenter J. Jazmynn Pho, RN, BSN, NCM [phone]  

## 2013-02-25 ENCOUNTER — Encounter (HOSPITAL_COMMUNITY): Payer: Self-pay

## 2013-02-25 LAB — RENAL FUNCTION PANEL
CO2: 26 mEq/L (ref 19–32)
Chloride: 97 mEq/L (ref 96–112)
GFR calc Af Amer: 9 mL/min — ABNORMAL LOW (ref >90)
GFR calc non Af Amer: 7 mL/min — ABNORMAL LOW (ref >90)
Glucose, Bld: 211 mg/dL — ABNORMAL HIGH (ref 70–99)
Potassium: 4.2 mEq/L (ref 3.5–5.1)
Sodium: 136 mEq/L (ref 135–145)

## 2013-02-25 LAB — GLUCOSE, CAPILLARY
Glucose-Capillary: 185 mg/dL — ABNORMAL HIGH (ref 70–99)
Glucose-Capillary: 223 mg/dL — ABNORMAL HIGH (ref 70–99)
Glucose-Capillary: 250 mg/dL — ABNORMAL HIGH (ref 70–99)

## 2013-02-25 LAB — CBC
HCT: 33.6 % — ABNORMAL LOW (ref 39.0–52.0)
MCH: 27.1 pg (ref 26.0–34.0)
MCV: 83.6 fL (ref 78.0–100.0)
Platelets: 234 10*3/uL (ref 150–400)
RDW: 14.4 % (ref 11.5–15.5)

## 2013-02-25 MED ORDER — DARBEPOETIN ALFA-POLYSORBATE 100 MCG/0.5ML IJ SOLN
INTRAMUSCULAR | Status: AC
  Administered 2013-02-25: 100 ug via SUBCUTANEOUS
  Filled 2013-02-25: qty 0.5

## 2013-02-25 MED ORDER — INSULIN DETEMIR 100 UNIT/ML ~~LOC~~ SOLN
75.0000 [IU] | Freq: Two times a day (BID) | SUBCUTANEOUS | Status: DC
  Administered 2013-02-25 – 2013-02-27 (×4): 75 [IU] via SUBCUTANEOUS
  Filled 2013-02-25 (×6): qty 0.75

## 2013-02-25 NOTE — Progress Notes (Signed)
Pt states that he puts himself on CPAP at night. Pt has a nasal CPAP mask. RT made pt aware that if he had any problems or questions to call that we would be here all night.

## 2013-02-25 NOTE — Progress Notes (Signed)
Family Medicine Teaching Service Daily Progress Note Service Pager: [phone]  Subjective: Seen in hemodialysis unit. Patient thinks his breathing is much better than when he came into the hospital. Has no complaints today.  Objective: Temp:  [98.3 F (36.8 C)-98.6 F (37 C)] 98.4 F (36.9 C) (06/04 0548) Pulse Rate:  [89-106] 98 (06/04 0548) Resp:  [18] 18 (06/04 0548) BP: (117-140)/(67-89) 140/89 mmHg (06/04 0548) SpO2:  [91 %-96 %] 91 % (06/04 0548) Weight:  [298 lb 12.8 oz (135.535 kg)] 298 lb 12.8 oz (135.535 kg) (06/04 0548)  Exam: General: NAD, lying in bed in dialysis Cardiovascular: RRR, no murmur appreciated Respiratory: normal respiratory effort, CTAB via anterior auscultation but exam limited by pt getting dialysis Abdomen: remains mildly distended but nontender to palpation Extremities: trace edema in bilateral lower extremities, greatly improved from admission   Filed Weights   02/24/13 0500 02/24/13 0649 02/25/13 0548  Weight: 293 lb 11.2 oz (133.221 kg) 293 lb 11.2 oz (133.221 kg) 298 lb 12.8 oz (135.535 kg)    I have reviewed the patient's medications, labs, imaging, and diagnostic testing.  Notable results are summarized below.  Medications:  Scheduled Meds: . albuterol  2 puff Inhalation BID  . atorvastatin  80 mg Oral q1800  . calcitRIOL  0.5 mcg Oral Daily  . carvedilol  12.5 mg Oral BID WC  . Chlorhexidine Gluconate Cloth  6 each Topical Q0600  . clopidogrel  75 mg Oral Daily  . darbepoetin (ARANESP) injection - NON-DIALYSIS  100 mcg Subcutaneous Q Wed-1800  . heparin  5,000 Units Subcutaneous Q8H  . hydrALAZINE  50 mg Oral TID  . insulin aspart  0-20 Units Subcutaneous TID WC  . insulin aspart  0-5 Units Subcutaneous QHS  . insulin aspart  5 Units Subcutaneous TID WC  . insulin detemir  70 Units Subcutaneous BID  . isosorbide mononitrate  60 mg Oral Daily  . mupirocin ointment   Nasal BID  . sevelamer carbonate  1,600 mg Oral TID WC  . sodium  chloride  3 mL Intravenous Q12H  . tiotropium  18 mcg Inhalation Daily   Continuous Infusions:  PRN Meds:.sodium chloride, acetaminophen, ondansetron (ZOFRAN) IV, polyethylene glycol, sodium chloride  Labs:  CBC  Recent Labs Lab 02/18/13 0710 02/20/13 0635 02/21/13 0459  WBC 8.7 13.1* 12.2*  HGB 9.8* 11.1* 10.9*  HCT 30.7* 34.6* 33.2*  PLT 219 246 262    BMET  Recent Labs Lab 02/23/13 0420 02/24/13 0500 02/25/13 0455  NA 134* 133* 136  K 3.8 4.0 4.2  CL 94* 94* 97  CO2 24 24 26   BUN 133* 103* 124*  CREATININE 7.59* 6.44* 7.35*  GLUCOSE 207* 180* 211*  CALCIUM 9.4 9.1 9.0   A1c 8.9  CBG (last 3)   Recent Labs  02/24/13 1630 02/24/13 2056 02/25/13 0617  GLUCAP 240* 281* 185*  Sliding scale: 36 units in last 24 hours  Net negative 13.6 L since admission Weight 298 - up 5 lb overnight (dry weight 295)   Assessment/Plan:  Jonathon Bailey is a 57 y.o. year old male with known CHF initially presented with an acute CHF exacerbation, likely secondary to non-compliance with home lasix. No evidence of fluid overload on CXR on admission, but did clinically appear fluid overloaded and has elevated pro BNP. Respiratory symptoms may also be due to COPD exacerbation given prominent wheezing on exam at presentation.   # Exacerbation of acute on chronic combined systolic and diastolic CHF: BNP elevated on admission.  Last echo was January 2014 and noted EF 40-45%, moderate hypokinesis of lateral myocardium, grade 2 diastolic dysfunction. Dry weight per pt is 295lb. Trop negative x 3.  - overall seems improved, stable, and ready for discharge from a pulmonary standpoint - will need to check ambulatory pulse ox prior to discharge - continue to monitor on tele  - diuretics have been discontinued since pt has started on HD - strict I&O, daily weights   # Hyperkalemia: - K remains normal - will be sure to stop K supplementation upon discharge  # CKD: has fistula placed  but not on dialysis. Baseline creatinine unclear, was 6-7 in January but that was in setting of acute CHF exacerbation. Prior to that admission was 3-4. 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 Cr elevated since admission likely secondary to diuresis. Appreciate Renal recommendations - continue dialysis as above per nephrology; pt being set up for outpt dialysis - Calcium and phos per renal - renal is consulting vascular surgery regarding fistula as it is not entirely mature  # Hypertension: Fairly well controlled, but requires multilple agents. Since admission decreased carvedilol dose to 12.5mg  BID in setting of acute CHF exacerbation with hx of CAD  - continue home hydralazine, imdur, and carvedilol as above.  - likely to continue to improve with dialysis; will adjust medications as appropriate - did have some tachycardia (low 100's) overnight, will continue to monitor on tele  # COPD: On RA w/ NWB. Recommend PFT outpt.  - continue home spiriva  - continue albuterol - prednisone 40mg  daily for possible exacerbation - has completed course  # Diabetes: takes levemir 75 BID at home, but compliance is a question. Hgb A1c 8.9 upon admission - continue levemir with SSI. currently on levemir 70 units BID. Given large SSI requirements will increase to 75 units BID today - mealtime insulin with novolog 5 units - will need close outpatient f/u  # CAD s/p MI with stents:  - continue plavix, imdur   # Sleep apnea:  - CPAP while hospitalized   # HLD: continue statin   # FEN/GI: renal diet, (apparently eating some outside foods); SLIV # Prophylaxis: heparin SQ  # Dispo: management as above; discharge planning pending outpt dialysis, medication changes, etc # Code Status: discussed with patient on admission, he desires to be full code.  Chrisandra Netters, MD Family Medicine PGY-1 Service Pager [phone]

## 2013-02-25 NOTE — Progress Notes (Signed)
Patient ID: Jonathon Bailey, male   DOB: [birth year], 57 y.o.   MRN: [id number] S:No new complaints O:BP 147/89  Pulse 112  Temp(Src) 96.7 F (35.9 C) (Oral)  Resp 18  Ht 6' (1.829 m)  Wt 133.1 kg (293 lb 6.9 oz)  BMI 39.79 kg/m2  SpO2 95%  Intake/Output Summary (Last 24 hours) at 02/25/13 0819 Last data filed at 02/25/13 0549  Gross per 24 hour  Intake    680 ml  Output   1275 ml  Net   -595 ml   Intake/Output: I/O last 3 completed shifts: In: [id number] [P.O.:1160; I.V.:3] Out: 2200 [Urine:2200]  Intake/Output this shift:    Weight change: 1.535 kg (3 lb 6.1 oz) Gen:WD WN AAM in NAD CVS:no rub Resp:CTA [id number] Ext:tr edema, LAVF +T/B   Recent Labs Lab 02/19/13 0400 02/20/13 0635 02/21/13 0440 02/22/13 0450 02/23/13 0420 02/24/13 0500 02/25/13 0455  NA 135 134* 135 132* 134* 133* 136  K 4.7 4.4 4.2 4.3 3.8 4.0 4.2  CL 101 98 98 94* 94* 94* 97  CO2 17* 21 23 20 24 24 26   GLUCOSE 304* 326* 278* 334* 207* 180* 211*  BUN 83* 92* 103* 116* 133* 103* 124*  CREATININE 6.26* 6.60* 6.86* 7.06* 7.59* 6.44* 7.35*  ALBUMIN  --  3.3* 3.2* 3.0* 3.1* 3.0* 2.9*  CALCIUM 9.5 9.8 9.5 9.3 9.4 9.1 9.0  PHOS  --  5.6* 5.6* 5.5* 7.0* 6.5* 6.7*   Liver Function Tests:  Recent Labs Lab 02/23/13 0420 02/24/13 0500 02/25/13 0455  ALBUMIN 3.1* 3.0* 2.9*   No results found for this basename: LIPASE, AMYLASE,  in the last 168 hours No results found for this basename: AMMONIA,  in the last 168 hours CBC:  Recent Labs Lab 02/20/13 0635 02/21/13 0459  WBC 13.1* 12.2*  HGB 11.1* 10.9*  HCT 34.6* 33.2*  MCV 84.4 83.2  PLT 246 262   Cardiac Enzymes: No results found for this basename: CKTOTAL, CKMB, CKMBINDEX, TROPONINI,  in the last 168 hours CBG:  Recent Labs Lab 02/24/13 0653 02/24/13 1145 02/24/13 1630 02/24/13 2056 02/25/13 0617  GLUCAP 155* 161* 240* 281* 185*    Iron Studies: No results found for this basename: IRON, TIBC, TRANSFERRIN, FERRITIN,  in the  last 72 hours Studies/Results: No results found. Marland Kitchen albuterol  2 puff Inhalation BID  . atorvastatin  80 mg Oral q1800  . calcitRIOL  0.5 mcg Oral Daily  . carvedilol  12.5 mg Oral BID WC  . Chlorhexidine Gluconate Cloth  6 each Topical Q0600  . clopidogrel  75 mg Oral Daily  . darbepoetin (ARANESP) injection - NON-DIALYSIS  100 mcg Subcutaneous Q Wed-1800  . heparin  5,000 Units Subcutaneous Q8H  . hydrALAZINE  50 mg Oral TID  . insulin aspart  0-20 Units Subcutaneous TID WC  . insulin aspart  0-5 Units Subcutaneous QHS  . insulin aspart  5 Units Subcutaneous TID WC  . insulin detemir  70 Units Subcutaneous BID  . isosorbide mononitrate  60 mg Oral Daily  . mupirocin ointment   Nasal BID  . sevelamer carbonate  1,600 mg Oral TID WC  . sodium chloride  3 mL Intravenous Q12H  . tiotropium  18 mcg Inhalation Daily    BMET    Component Value Date/Time   NA 136 02/25/2013 0455   K 4.2 02/25/2013 0455   CL 97 02/25/2013 0455   CO2 26 02/25/2013 0455   GLUCOSE 211* 02/25/2013 0455   BUN 124*  02/25/2013 0455   CREATININE 7.35* 02/25/2013 0455   CREATININE 7.17* 10/23/2012 1554   CALCIUM 9.0 02/25/2013 0455   GFRNONAA 7* 02/25/2013 0455   GFRAA 9* 02/25/2013 0455   CBC    Component Value Date/Time   WBC 12.2* 02/21/2013 0459   RBC 3.99* 02/21/2013 0459   HGB 10.9* 02/21/2013 0459   HCT 33.2* 02/21/2013 0459   PLT 262 02/21/2013 0459   MCV 83.2 02/21/2013 0459   MCH 27.3 02/21/2013 0459   MCHC 32.8 02/21/2013 0459   RDW 14.3 02/21/2013 0459   LYMPHSABS 1.2 10/11/2012 1256   MONOABS 0.9 10/11/2012 1256   EOSABS 0.1 10/11/2012 1256   BASOSABS 0.0 10/11/2012 1256     Assessment/Plan:  1. AKI/CKD- now ESRD. Had first HD 02/23/13. Will plan qod due to new AVF. Have sent out information for outpt HD 2. Acute on chronic diastolic CHF- for HD with UF again today. 3. 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 COPD- per primary svc 5. DM- per primary svc 6. OSA 7. SHPTH- on vit D. Will need to start binders. renvela 800mg  2 qac and  follow 8. Vascular access- LFA AVF +T/B.  Poorly maturing. Will ask [facility] to evaluate while he is an inpt. 9. Dispo- awaiting outpt HD spot before discharge. [place]

## 2013-02-25 NOTE — Progress Notes (Signed)
Family Medicine Teaching Service Attending Note  I interviewed and examined patient Jonathon Bailey and reviewed their tests and x-rays.  I discussed with Dr. Ardelia Mems and reviewed their note for today.  I agree with their assessment and plan.

## 2013-02-26 DIAGNOSIS — N186 End stage renal disease: Secondary | ICD-10-CM

## 2013-02-26 LAB — RENAL FUNCTION PANEL
Albumin: 3 g/dL — ABNORMAL LOW (ref 3.5–5.2)
BUN: 91 mg/dL — ABNORMAL HIGH (ref 6–23)
CO2: 21 mEq/L (ref 19–32)
Chloride: 96 mEq/L (ref 96–112)
Creatinine, Ser: 6.38 mg/dL — ABNORMAL HIGH (ref 0.50–1.35)
GFR calc non Af Amer: 9 mL/min — ABNORMAL LOW (ref >90)
Potassium: 3.8 mEq/L (ref 3.5–5.1)

## 2013-02-26 LAB — GLUCOSE, CAPILLARY: Glucose-Capillary: 217 mg/dL — ABNORMAL HIGH (ref 70–99)

## 2013-02-26 MED ORDER — ALBUTEROL SULFATE HFA 108 (90 BASE) MCG/ACT IN AERS: 2.0000 | INHALATION_SPRAY | RESPIRATORY_TRACT | Status: DC | PRN

## 2013-02-26 NOTE — Progress Notes (Signed)
Family Medicine Teaching Service Daily Progress Note Service Pager: [phone]  Subjective: No complaints this morning. Says his breathing is fine. He is interested in going home today. Has not yet met with vascular surgeons regarding his fistula.  Objective: Temp:  [98.2 F (36.8 C)-98.5 F (36.9 C)] 98.2 F (36.8 C) (06/05 0432) Pulse Rate:  [95-105] 105 (06/05 0432) Resp:  [11-20] 20 (06/05 0432) BP: (78-148)/(50-85) 135/85 mmHg (06/05 0432) SpO2:  [94 %-95 %] 95 % (06/05 0432) Weight:  [294 lb (133.358 kg)] 294 lb (133.358 kg) (06/05 0432)  Exam: General: NAD, sitting up in chair Cardiovascular: RRR, no murmur appreciated Respiratory: normal respiratory effort, diminished breath sounds throughout Abdomen: less distended than previously, nontender to palpation Extremities: trace edema in bilateral lower extremities, greatly improved from admission. Calves nontender to palpation Neuro: grossly nonfocal, speech intact   Filed Weights   02/25/13 0548 02/25/13 0756 02/26/13 0432  Weight: 298 lb 12.8 oz (135.535 kg) 293 lb 6.9 oz (133.1 kg) 294 lb (133.358 kg)    I have reviewed the patient's medications, labs, imaging, and diagnostic testing.  Notable results are summarized below.  Medications:  Scheduled Meds: . albuterol  2 puff Inhalation BID  . atorvastatin  80 mg Oral q1800  . calcitRIOL  0.5 mcg Oral Daily  . carvedilol  12.5 mg Oral BID WC  . Chlorhexidine Gluconate Cloth  6 each Topical Q0600  . clopidogrel  75 mg Oral Daily  . darbepoetin (ARANESP) injection - NON-DIALYSIS  100 mcg Subcutaneous Q Wed-1800  . heparin  5,000 Units Subcutaneous Q8H  . hydrALAZINE  50 mg Oral TID  . insulin aspart  0-20 Units Subcutaneous TID WC  . insulin aspart  0-5 Units Subcutaneous QHS  . insulin aspart  5 Units Subcutaneous TID WC  . insulin detemir  75 Units Subcutaneous BID  . isosorbide mononitrate  60 mg Oral Daily  . mupirocin ointment   Nasal BID  . sevelamer carbonate   1,600 mg Oral TID WC  . sodium chloride  3 mL Intravenous Q12H  . tiotropium  18 mcg Inhalation Daily   Continuous Infusions:  PRN Meds:.sodium chloride, acetaminophen, ondansetron (ZOFRAN) IV, polyethylene glycol, sodium chloride  Labs:  CBC  Recent Labs Lab 02/20/13 0635 02/21/13 0459 02/25/13 0932  WBC 13.1* 12.2* 10.9*  HGB 11.1* 10.9* 10.9*  HCT 34.6* 33.2* 33.6*  PLT 246 262 234    BMET  Recent Labs Lab 02/24/13 0500 02/25/13 0455 02/26/13 0500  NA 133* 136 134*  K 4.0 4.2 3.8  CL 94* 97 96  CO2 24 26 21   BUN 103* 124* 91*  CREATININE 6.44* 7.35* 6.38*  GLUCOSE 180* 211* 208*  CALCIUM 9.1 9.0 9.2   A1c 8.9  CBG (last 3)   Recent Labs  02/25/13 1622 02/25/13 2009 02/26/13 0627  GLUCAP 223* 250* 217*  Sliding scale: 29 units in last 24 hours  Net negative 14.7 L since admission Weight 294 (dry weight 295)   Assessment/Plan:  Jonathon Bailey is a 57 y.o. year old male with known CHF initially presented with an acute CHF exacerbation, likely secondary to non-compliance with home lasix. No evidence of fluid overload on CXR on admission, but did clinically appear fluid overloaded and has elevated pro BNP. Respiratory symptoms may also be due to COPD exacerbation given prominent wheezing on exam at presentation.   # Exacerbation of acute on chronic combined systolic and diastolic CHF: BNP elevated on admission. Last echo was January 2014 and  noted EF 40-45%, moderate hypokinesis of lateral myocardium, grade 2 diastolic dysfunction. Dry weight per pt is 295lb. Trop negative x 3.  - overall seems improved, stable, and ready for discharge from a pulmonary standpoint - will need to check ambulatory pulse ox prior to discharge - continue to monitor on tele  - diuretics have been discontinued since pt has started on HD - strict I&O, daily weights   # Hyperkalemia: - K remains normal - will be sure to stop K supplementation upon discharge  # CKD: has  fistula placed but not on dialysis. Baseline creatinine unclear, was 6-7 in January but that was in setting of acute CHF exacerbation. Prior to that admission was 3-4. 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 Cr elevated since admission likely secondary to diuresis. Appreciate Renal recommendations - continue dialysis as above per nephrology; pt being set up for outpt dialysis - Calcium and phos per renal - renal is consulting vascular surgery regarding fistula as it is not entirely mature  # Hypertension: Fairly well controlled, but requires multilple agents. Since admission decreased carvedilol dose to 12.5mg  BID in setting of acute CHF exacerbation with hx of CAD  - continue home hydralazine, imdur, and carvedilol as above.  - likely to continue to improve with dialysis; will adjust medications as appropriate  # Tachycardia: has been tachycardic in low 100 range - monitor on telemetry  # COPD: On RA w/ NWB. Recommend PFT outpt.  - continue home spiriva  - continue albuterol - prednisone 40mg  daily for possible exacerbation - has completed course  # Diabetes: takes levemir 75 BID at home, but compliance is a question. Hgb A1c 8.9 upon admission - continue levemir with SSI. currently on home regimen of levemir 75 units BID. Anticipate insulin needs will rise now that on HD. - mealtime insulin with novolog 5 units - will need close outpatient f/u  # CAD s/p MI with stents:  - continue plavix, imdur   # Sleep apnea:  - CPAP while hospitalized   # HLD: continue statin   # FEN/GI: renal diet, (apparently eating some outside foods); SLIV # Prophylaxis: heparin SQ  # Dispo: management as above; discharge planning pending getting setup with outpt dialysis, medication changes, vascular surgery evaluation # Code Status: discussed with patient on admission, he desires to be full code.  Chrisandra Netters, MD Family Medicine PGY-1 Service Pager [phone]

## 2013-02-26 NOTE — Consult Note (Addendum)
[facility]      Consult Note  Patient name: Jonathon Bailey MRN: [id number] DOB: 09/02/1956 Sex: male  Consulting Physician:  Family medicine  Reason for Consult:  Chief Complaint  Patient presents with  . Shortness of Breath    HISTORY OF PRESENT ILLNESS: This is a 57 year old gentleman who is status post left radiocephalic fistula by Dr. Kellie Simmering in January of 2014. Dr. Kellie Simmering most recently saw the patient in April of this year and felt that the fistula was able to be cannulated. At that time the patient was not yet on dialysis. The patient was admitted to the hospital with an acute CHF exacerbation. This was felt to be secondary to medical noncompliance as he had not been taking his Lasix. The patient has gone on to need a renal replacement therapy. He has undergone successful dialysis via the left radiocephalic fistula on 2 separate occasions, however there was reported difficulty with his access. I have been asked to evaluate this.  The patient's renal failure secondary to diabetes and hypertension. The patient has a history of coronary artery disease. He has had an MI x2. He has undergone PCI in 2003. His most recent echo showed an ejection fraction of 50-55%. He is also had a stroke with residual right-sided weakness. The patient is morbidly obese. He suffers from sleep apnea and uses a CPAP at night.  Past Medical History  Diagnosis Date  . Diabetes mellitus   . Hyperlipidemia   . Hypertension   . Myocardial infarction     status post MI x2 and 3 stents placed in 2003  . Stroke ~ 2007; ~1987    "weak on right side; messed w/right side of brain; cry all the time"  . Sleep apnea     "sleep w/CPAP sometimes"  . Arthritis   . Anxiety   . Depression   . Chronic systolic heart failure [id number]    ECHO Feb 2013 showed LVEF low normal at 50-55%, +hypokinetic anterolateral wall and inferolateral wall.    . CKD (chronic kidney disease) stage 4, GFR  15-29 ml/min 08/30/2009    Progressive renal failure since 2008, creatinine 1.2 in 2008 up to 3.5 in 2012 and 3.2-5.0 in 2013. All UA's 2011-13 showed >300 protein on dipstick. Work-up in May 2011 showed negative Urine IFE and SPEP, ultrasound showed 12-13 cm kidneys with increased echogenicity and UPC ratio was 1.5 gm proteinuria.  Hgb A1C's from 2011 to 2013 were all between 9-11.  Patient saw Dr. Donnetta Hutching (vasc surgery) for HD access in Aug 2013 > vein mapping was done and Dr. Donnetta Hutching felt the left arm (pt is R handed) was suitable for L arm Cimino radiocephalic fistula. Patient said he wasn't ready to consider doing dialysis and declined the surgery.       Past Surgical History  Procedure Laterality Date  . Coronary stent placement    . Hernia repair      umbilical  . Coronary angioplasty with stent placement  ~ 2002    "3"  . Av fistula placement  10/17/2012    Procedure: ARTERIOVENOUS (AV) FISTULA CREATION;  Surgeon: Mal Misty, MD;  Location: [facility];  Service: Vascular;  Laterality: Left;    History   Social History  . Marital Status: Divorced    Spouse Name: N/A    Number of Children: N/A  . Years of Education: N/A   Occupational History  . Not on file.   Social History Main  Topics  . Smoking status: Former Smoker -- 1.00 packs/day for 15 years    Types: Cigarettes    Quit date: 09/24/1986  . Smokeless tobacco: Never Used  . Alcohol Use: 1.2 oz/week    2 Shots of liquor per week  . Drug Use: No  . Sexually Active: Not Currently   Other Topics Concern  . Not on file   Social History Narrative  . No narrative on file    Family History  Problem Relation Age of Onset  . Asthma Mother   . Hyperlipidemia Mother   . Hypertension Mother   . Stroke Father   . Heart attack Father   . Prostate cancer Father   . Deep vein thrombosis Father   . Cancer Father   . Diabetes Father   . Hyperlipidemia Father   . Hypertension Father   . Other Father     varicose veins  .  Heart disease Father     before age 51  . Other Sister     varicose veins    Allergies as of 02/17/2013 - Review Complete 02/17/2013  Allergen Reaction Noted  . Feraheme (ferumoxytol) Itching 10/16/2012    No current facility-administered medications on file prior to encounter.   Current Outpatient Prescriptions on File Prior to Encounter  Medication Sig Dispense Refill  . albuterol (PROVENTIL HFA;VENTOLIN HFA) 108 (90 BASE) MCG/ACT inhaler Inhale 2 puffs into the lungs every 4 (four) hours as needed. For shortness of breath and wheezing  1 Inhaler  2  . Blood Glucose Monitoring Suppl (ACCU-CHEK AVIVA PLUS) W/DEVICE KIT USE TO CHECK BLOOD SUGAR 6 TIMES DAILY  1 kit  0  . calcitRIOL (ROCALTROL) 0.25 MCG capsule Take 0.25-0.5 mcg by mouth daily. Alternates takes 1 tablet and 2 capsules the next day      . carvedilol (COREG) 25 MG tablet Take 37.5 mg by mouth 2 (two) times daily with a meal.      . clopidogrel (PLAVIX) 75 MG tablet Take 1 tablet (75 mg total) by mouth daily.  30 tablet  5  . fluticasone (FLONASE) 50 MCG/ACT nasal spray Place 2 sprays into the nose daily.  16 g  0  . furosemide (LASIX) 80 MG tablet Take 2 tablets (160 mg total) by mouth 3 (three) times daily.  180 tablet  3  . glucose blood (ACCU-CHEK AVIVA PLUS) test strip Use to check blood sugars six times a day.  DX: 250.02  100 each  12  . hydrALAZINE (APRESOLINE) 50 MG tablet Take 1 tablet (50 mg total) by mouth 3 (three) times daily.  90 tablet  3  . insulin detemir (LEVEMIR) 100 UNIT/ML injection Inject 75 Units into the skin 2 (two) times daily. Please give pen if covered. 3 month supply.  10 mL  12  . Insulin Syringe-Needle U-100 25G X 1" 1 ML MISC 1 applicator by Does not apply route 2 (two) times daily. Please give needles 4 Levemir.  200 each  0  . isosorbide mononitrate (IMDUR) 60 MG 24 hr tablet Take 60 mg by mouth daily.      . potassium chloride SA (K-DUR,KLOR-CON) 20 MEQ tablet Take 1 tablet (20 mEq total) by  mouth daily.  30 tablet  3  . rosuvastatin (CRESTOR) 40 MG tablet Take 1 tablet (40 mg total) by mouth at bedtime.  30 tablet  6  . tiotropium (SPIRIVA HANDIHALER) 18 MCG inhalation capsule Place 1 capsule (18 mcg total) into inhaler and inhale daily.  30 capsule  3     REVIEW OF SYSTEMS: Positive for shortness of breath, lower extremity edema. Negative for fevers chills, nausea vomiting. All other systems are negative  PHYSICAL EXAMINATION: General: The patient appears their stated age.  Vital signs are BP 168/93  Pulse 115  Temp(Src) 98.2 F (36.8 C) (Oral)  Resp 20  Ht 6' (1.829 m)  Wt 294 lb (133.358 kg)  BMI 39.86 kg/m2  SpO2 93% Pulmonary: Respirations are non-labored HEENT:  No gross abnormalities Abdomen: Soft and non-tender   Musculoskeletal: There are no major deformities.   Neurologic: No focal weakness or paresthesias are detected, Skin: There are no ulcer or rashes noted. Psychiatric: The patient has normal affect. Cardiovascular: There is a regular rate and rhythm without significant murmur appreciated. Palpable thrill within left radiocephalic fistula, however the fistula does appear to be small in caliber  Diagnostic Studies: None    Assessment:  End-stage renal disease with poorly functioning left radiocephalic fistula Plan: The patient will be scheduled for a fistulogram tomorrow morning. He'll be made n.p.o. after midnight. I discussed the details of this procedure with the patient and he is willing to proceed. I spoke with the dialysis unit. They are going to dialyze him first thing in the morning at 5 AM so that he would be ready for a procedure as early as 9 AM     V. Leia Alf, M.D. [facility] Office: [phone] Pager:  [phone]

## 2013-02-26 NOTE — Progress Notes (Signed)
Patient ID: Jonathon Bailey, male   DOB: 1955/10/17, 57 y.o.   MRN: [id number] S:no new complaints O:BP 135/85  Pulse 105  Temp(Src) 98.2 F (36.8 C) (Oral)  Resp 20  Ht 6' (1.829 m)  Wt 133.358 kg (294 lb)  BMI 39.86 kg/m2  SpO2 93%  Intake/Output Summary (Last 24 hours) at 02/26/13 1052 Last data filed at 02/26/13 0852  Gross per 24 hour  Intake    720 ml  Output   1500 ml  Net   -780 ml   Intake/Output: I/O last 3 completed shifts: In: 840 [P.O.:840] Out: 2475 [id number]; Other:1000]  Intake/Output this shift:  Total I/O In: 240 [P.O.:240] Out: -  Weight change: -2.435 kg (-5 lb 5.9 oz) Gen:WD obese AAM in NAD CVS:no rub Resp:CTA Abd:+BS, soft, NT/ND Ext:tr edema, LFA AVF+T/B   Recent Labs Lab 02/20/13 0635 02/21/13 0440 02/22/13 0450 02/23/13 0420 02/24/13 0500 02/25/13 0455 02/26/13 0500  NA 134* 135 132* 134* 133* 136 134*  K 4.4 4.2 4.3 3.8 4.0 4.2 3.8  CL 98 98 94* 94* 94* 97 96  CO2 21 23 20 24 24 26 21   GLUCOSE 326* 278* 334* 207* 180* 211* 208*  BUN 92* 103* 116* 133* 103* 124* 91*  CREATININE 6.60* 6.86* 7.06* 7.59* 6.44* 7.35* 6.38*  ALBUMIN 3.3* 3.2* 3.0* 3.1* 3.0* 2.9* 3.0*  CALCIUM 9.8 9.5 9.3 9.4 9.1 9.0 9.2  PHOS 5.6* 5.6* 5.5* 7.0* 6.5* 6.7* 5.1*   Liver Function Tests:  Recent Labs Lab 02/24/13 0500 02/25/13 0455 02/26/13 0500  ALBUMIN 3.0* 2.9* 3.0*   No results found for this basename: LIPASE, AMYLASE,  in the last 168 hours No results found for this basename: AMMONIA,  in the last 168 hours CBC:  Recent Labs Lab 02/20/13 0635 02/21/13 0459 02/25/13 0932  WBC 13.1* 12.2* 10.9*  HGB 11.1* 10.9* 10.9*  HCT 34.6* 33.2* 33.6*  MCV 84.4 83.2 83.6  PLT 246 262 234   Cardiac Enzymes: No results found for this basename: CKTOTAL, CKMB, CKMBINDEX, TROPONINI,  in the last 168 hours CBG:  Recent Labs Lab 02/25/13 0617 02/25/13 1219 02/25/13 1622 02/25/13 2009 02/26/13 0627  GLUCAP 185* 137* 223* 250* 217*    Iron  Studies: No results found for this basename: IRON, TIBC, TRANSFERRIN, FERRITIN,  in the last 72 hours Studies/Results: No results found. Marland Kitchen atorvastatin  80 mg Oral q1800  . calcitRIOL  0.5 mcg Oral Daily  . carvedilol  12.5 mg Oral BID WC  . Chlorhexidine Gluconate Cloth  6 each Topical Q0600  . clopidogrel  75 mg Oral Daily  . darbepoetin (ARANESP) injection - NON-DIALYSIS  100 mcg Subcutaneous Q Wed-1800  . heparin  5,000 Units Subcutaneous Q8H  . hydrALAZINE  50 mg Oral TID  . insulin aspart  0-20 Units Subcutaneous TID WC  . insulin aspart  0-5 Units Subcutaneous QHS  . insulin aspart  5 Units Subcutaneous TID WC  . insulin detemir  75 Units Subcutaneous BID  . isosorbide mononitrate  60 mg Oral Daily  . mupirocin ointment   Nasal BID  . sevelamer carbonate  1,600 mg Oral TID WC  . sodium chloride  3 mL Intravenous Q12H  . tiotropium  18 mcg Inhalation Daily    BMET    Component Value Date/Time   NA 134* 02/26/2013 0500   K 3.8 02/26/2013 0500   CL 96 02/26/2013 0500   CO2 21 02/26/2013 0500   GLUCOSE 208* 02/26/2013 0500   BUN  91* 02/26/2013 0500   CREATININE 6.38* 02/26/2013 0500   CREATININE 7.17* 10/23/2012 1554   CALCIUM 9.2 02/26/2013 0500   GFRNONAA 9* 02/26/2013 0500   GFRAA 10* 02/26/2013 0500   CBC    Component Value Date/Time   WBC 10.9* 02/25/2013 0932   RBC 4.02* 02/25/2013 0932   HGB 10.9* 02/25/2013 0932   HCT 33.6* 02/25/2013 0932   PLT 234 02/25/2013 0932   MCV 83.6 02/25/2013 0932   MCH 27.1 02/25/2013 0932   MCHC 32.4 02/25/2013 0932   RDW 14.4 02/25/2013 0932   LYMPHSABS 1.2 10/11/2012 1256   MONOABS 0.9 10/11/2012 1256   EOSABS 0.1 10/11/2012 1256   BASOSABS 0.0 10/11/2012 1256     Assessment/Plan:  1. New ESRD- Had first HD 02/23/13. Will plan qod due to new AVF. Have sent out information for outpt HD and is set up for [facility] next week.  HD tomorrow. 2. Acute on chronic diastolic CHF- for HD with UF again tomorrow. 3. 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 COPD- per primary svc 5. DM- per primary  svc 6. OSA 7. SHPTH- on vit D. Will need to start binders. renvela 800mg  2 qac and follow 8. Vascular access- LFA AVF +T/B. Poorly maturing. Will ask VVS to evaluate while he is an inpt. 9. Dispo- hopeful d/c tomorrow after HD if stable. [place]

## 2013-02-26 NOTE — Progress Notes (Signed)
Family Medicine Teaching Service Attending Note  I interviewed and examined patient Jonathon Bailey and reviewed their tests and x-rays.  I discussed with Dr. Ardelia Mems and reviewed their note for today.  I agree with their assessment and plan.     Additionally  Feels well Proceed as per nephrology Medically stable

## 2013-02-27 ENCOUNTER — Encounter (HOSPITAL_COMMUNITY): Payer: Self-pay | Admitting: Pharmacy Technician

## 2013-02-27 ENCOUNTER — Encounter (HOSPITAL_COMMUNITY)
Admission: EM | Disposition: A | Discharge status: Home Health | Payer: Self-pay | Source: Home / Self Care | Attending: Family Medicine

## 2013-02-27 LAB — GLUCOSE, CAPILLARY
Glucose-Capillary: 194 mg/dL — ABNORMAL HIGH (ref 70–99)
Glucose-Capillary: 236 mg/dL — ABNORMAL HIGH (ref 70–99)

## 2013-02-27 LAB — RENAL FUNCTION PANEL
Albumin: 2.9 g/dL — ABNORMAL LOW (ref 3.5–5.2)
BUN: 97 mg/dL — ABNORMAL HIGH (ref 6–23)
Chloride: 98 mEq/L (ref 96–112)
GFR calc Af Amer: 10 mL/min — ABNORMAL LOW (ref >90)
Phosphorus: 5 mg/dL — ABNORMAL HIGH (ref 2.3–4.6)
Potassium: 3.8 mEq/L (ref 3.5–5.1)
Sodium: 137 mEq/L (ref 135–145)

## 2013-02-27 SURGERY — ASSESSMENT, SHUNT FUNCTION, WITH CONTRAST RADIOGRAPHIC STUDY
Anesthesia: LOCAL

## 2013-02-27 MED ORDER — ALTEPLASE 2 MG IJ SOLR: 2.0000 mg | Freq: Once | INTRAMUSCULAR | Status: DC | PRN

## 2013-02-27 MED ORDER — PENTAFLUOROPROP-TETRAFLUOROETH EX AERO: 1.0000 "application " | INHALATION_SPRAY | CUTANEOUS | Status: DC | PRN

## 2013-02-27 MED ORDER — HEPARIN SODIUM (PORCINE) 1000 UNIT/ML DIALYSIS
1000.0000 [IU] | INTRAMUSCULAR | Status: DC | PRN
  Filled 2013-02-27: qty 1

## 2013-02-27 MED ORDER — LIDOCAINE HCL (PF) 1 % IJ SOLN: 5.0000 mL | INTRAMUSCULAR | Status: DC | PRN

## 2013-02-27 MED ORDER — SODIUM CHLORIDE 0.9 % IV SOLN: 100.0000 mL | INTRAVENOUS | Status: DC | PRN

## 2013-02-27 MED ORDER — ALTEPLASE 2 MG IJ SOLR
2.0000 mg | Freq: Once | INTRAMUSCULAR | Status: DC | PRN
  Filled 2013-02-27: qty 2

## 2013-02-27 MED ORDER — HEPARIN SODIUM (PORCINE) 1000 UNIT/ML DIALYSIS
20.0000 [IU]/kg | INTRAMUSCULAR | Status: DC | PRN
  Filled 2013-02-27: qty 3

## 2013-02-27 MED ORDER — NEPRO/CARBSTEADY PO LIQD
237.0000 mL | ORAL | Status: DC | PRN
  Filled 2013-02-27: qty 237

## 2013-02-27 MED ORDER — NEPRO/CARBSTEADY PO LIQD: 237.0000 mL | ORAL | Status: DC | PRN

## 2013-02-27 MED ORDER — ROSUVASTATIN CALCIUM 40 MG PO TABS: 40.0000 mg | ORAL_TABLET | Freq: Every day | ORAL | Status: DC

## 2013-02-27 MED ORDER — LIDOCAINE-PRILOCAINE 2.5-2.5 % EX CREA
1.0000 | TOPICAL_CREAM | CUTANEOUS | Status: DC | PRN
Start: 2013-02-27 — End: 2013-02-27

## 2013-02-27 MED ORDER — LIDOCAINE-PRILOCAINE 2.5-2.5 % EX CREA: 1.0000 "application " | TOPICAL_CREAM | CUTANEOUS | Status: DC | PRN

## 2013-02-27 MED ORDER — SEVELAMER CARBONATE 800 MG PO TABS: 1600.0000 mg | ORAL_TABLET | Freq: Three times a day (TID) | ORAL | Status: DC

## 2013-02-27 MED ORDER — FLUTICASONE PROPIONATE 50 MCG/ACT NA SUSP: 2.0000 | Freq: Every day | NASAL | Status: DC

## 2013-02-27 MED ORDER — PENTAFLUOROPROP-TETRAFLUOROETH EX AERO
INHALATION_SPRAY | CUTANEOUS | Status: AC
  Filled 2013-02-27: qty 103.5

## 2013-02-27 MED ORDER — HEPARIN SODIUM (PORCINE) 1000 UNIT/ML DIALYSIS
20.0000 [IU]/kg | INTRAMUSCULAR | Status: DC | PRN
  Administered 2013-02-27: 2700 [IU] via INTRAVENOUS_CENTRAL

## 2013-02-27 MED ORDER — HEPARIN SODIUM (PORCINE) 1000 UNIT/ML DIALYSIS: 1000.0000 [IU] | INTRAMUSCULAR | Status: DC | PRN

## 2013-02-27 NOTE — Progress Notes (Signed)
Family Medicine Teaching Service Daily Progress Note Service Pager: [phone]  Subjective: No complaints this morning. Breathing is doing well. Seen in dialysis unit. Is getting a fistulagram after dialysis today, after which he hopes to be able to go home. States he is hungry (has been NPO awaiting fistulagram)  Objective: Temp:  [98 F (36.7 C)-98.7 F (37.1 C)] 98 F (36.7 C) (06/06 0700) Pulse Rate:  [94-115] 101 (06/06 0830) Resp:  [13-20] 17 (06/06 0830) BP: (113-168)/(66-93) 135/74 mmHg (06/06 0830) SpO2:  [92 %-97 %] 93 % (06/06 0830) Weight:  [294 lb (133.358 kg)-294 lb 12.1 oz (133.7 kg)] 294 lb 12.1 oz (133.7 kg) (06/06 0700)  Exam: General: NAD, lying in bed in dialysis unit Cardiovascular: tachycardic but regular rhythm, no murmur appreciated Respiratory: normal respiratory effort, CTAB via anterior auscultation Abdomen: less distended than previously, nontender to palpation Extremities:Calves nontender to palpation Neuro: grossly nonfocal, speech intact   Filed Weights   02/26/13 0432 02/27/13 0500 02/27/13 0700  Weight: 294 lb (133.358 kg) 294 lb (133.358 kg) 294 lb 12.1 oz (133.7 kg)    I have reviewed the patient's medications, labs, imaging, and diagnostic testing.  Notable results are summarized below.  Medications:  Scheduled Meds: . atorvastatin  80 mg Oral q1800  . calcitRIOL  0.5 mcg Oral Daily  . carvedilol  12.5 mg Oral BID WC  . Chlorhexidine Gluconate Cloth  6 each Topical Q0600  . clopidogrel  75 mg Oral Daily  . darbepoetin (ARANESP) injection - NON-DIALYSIS  100 mcg Subcutaneous Q Wed-1800  . heparin  5,000 Units Subcutaneous Q8H  . hydrALAZINE  50 mg Oral TID  . insulin aspart  0-20 Units Subcutaneous TID WC  . insulin aspart  0-5 Units Subcutaneous QHS  . insulin aspart  5 Units Subcutaneous TID WC  . insulin detemir  75 Units Subcutaneous BID  . isosorbide mononitrate  60 mg Oral Daily  . mupirocin ointment   Nasal BID  . sevelamer  carbonate  1,600 mg Oral TID WC  . sodium chloride  3 mL Intravenous Q12H  . tiotropium  18 mcg Inhalation Daily   Continuous Infusions:  PRN Meds:.sodium chloride, sodium chloride, sodium chloride, acetaminophen, albuterol, alteplase, feeding supplement (NEPRO CARB STEADY), heparin, heparin, lidocaine (PF), lidocaine-prilocaine, ondansetron (ZOFRAN) IV, pentafluoroprop-tetrafluoroeth, polyethylene glycol, sodium chloride  Labs:  CBC  Recent Labs Lab 02/21/13 0459 02/25/13 0932  WBC 12.2* 10.9*  HGB 10.9* 10.9*  HCT 33.2* 33.6*  PLT 262 234    BMET  Recent Labs Lab 02/25/13 0455 02/26/13 0500 02/27/13 0455  NA 136 134* 137  K 4.2 3.8 3.8  CL 97 96 98  CO2 26 21 25   BUN 124* 91* 97*  CREATININE 7.35* 6.38* 6.54*  GLUCOSE 211* 208* 165*  CALCIUM 9.0 9.2 9.5   A1c 8.9  CBG (last 3)   Recent Labs  02/26/13 1649 02/26/13 2130 02/27/13 0557  GLUCAP 200* 194* 168*  Sliding scale: 26 units in last 24 hours  Net negative 15.2 L since admission Weight 294 (dry weight 295)   Assessment/Plan:  DRISTEN BUDZYNSKI is a 57 y.o. year old male with known CHF initially presented with an acute CHF exacerbation, likely secondary to non-compliance with home lasix. No evidence of fluid overload on CXR on admission, but did clinically appear fluid overloaded and has elevated pro BNP. Respiratory symptoms may also have been due to COPD exacerbation given prominent wheezing on exam at presentation.   # Exacerbation of acute on chronic  combined systolic and diastolic CHF: BNP elevated on admission. Last echo was January 2014 and noted EF 40-45%, moderate hypokinesis of lateral myocardium, grade 2 diastolic dysfunction. Dry weight per pt is 295lb. Trop negative x 3.  - overall seems improved, stable, and ready for discharge from a pulmonary standpoint - will need to check ambulatory pulse ox prior to discharge - continue to monitor on tele  - diuretics have been discontinued since pt  has started on HD - strict I&O, daily weights   # Hyperkalemia: - K remains normal - will be sure to stop K supplementation upon discharge  # CKD: has fistula placed but not on dialysis. Baseline creatinine unclear, was 6-7 in January but that was in setting of acute CHF exacerbation. Prior to that admission was 3-4. 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 Cr elevated since admission likely secondary to diuresis. Appreciate Renal recommendations - continue dialysis as above per nephrology; pt now set up for outpatient dialysis - Calcium and phos per renal - vascular surgery consulted and doing a fistulagram today to evaluate fistula  # Hypertension: Fairly well controlled, but requires multilple agents. Since admission decreased carvedilol dose to 12.5mg  BID in setting of acute CHF exacerbation with hx of CAD  - continue home hydralazine, imdur, and carvedilol as above.  - likely to continue to improve with dialysis; will adjust medications as appropriate  # Tachycardia: has been tachycardic in low 100 range - monitor on telemetry  # COPD: On RA w/ NWB. Recommend PFT outpt.  - continue home spiriva  - continue albuterol - prednisone 40mg  daily for possible exacerbation - has completed course  # Diabetes: takes levemir 75 BID at home, but compliance is a question. Hgb A1c 8.9 upon admission - continue levemir with SSI. currently on home regimen of levemir 75 units BID. Anticipate insulin needs will rise now that on HD. - mealtime insulin with novolog 5 units - will need close outpatient f/u  # CAD s/p MI with stents:  - continue plavix, imdur   # Sleep apnea:  - CPAP while hospitalized   # HLD: continue statin   # FEN/GI: renal diet, (apparently eating some outside foods); SLIV # Prophylaxis: heparin SQ  # Dispo: management as above; discharge pending vascular surgery evaluation, possible d/c home later today # Code Status: discussed with patient on admission, he desires to  be full code.  Chrisandra Netters, MD Family Medicine PGY-1 Service Pager [phone]

## 2013-02-27 NOTE — Progress Notes (Signed)
Pt had dialysis today and tolerated well, vital signs remained stabled throughout the day, discharge instructions given to patient, all questions answered, MD called to refill crestor and flonase. IV and telemetry discontinued.

## 2013-02-27 NOTE — Progress Notes (Signed)
Unfortunately the patient was still receiving hemodialysis during the small window where he could potentially have had his fistulogram today. Therefore it was not performed. He was able to dialyze with a 17-gauge needle a low flow rate. I will schedule him for a fistulogram next week. From my perspective, he can be discharged.  Annamarie Major

## 2013-02-27 NOTE — Progress Notes (Signed)
Patient ID: Jonathon Bailey, male   DOB: 1956-01-15, 57 y.o.   MRN: [id number]  [facility] Progress Note    Subjective:   No complaints   Objective:   BP 135/74  Pulse 101  Temp(Src) 98 F (36.7 C) (Oral)  Resp 17  Ht 6' (1.829 m)  Wt 133.7 kg (294 lb 12.1 oz)  BMI 39.97 kg/m2  SpO2 93%  Intake/Output: I/O last 3 completed shifts: In: 53 [P.O.:960; I.V.:3] Out: 1725 [Urine:1725]   Intake/Output this shift:    Weight change: 0.258 kg (9.1 oz)  Physical Exam: Gen:WD WN AAM in NAD Lungs- cta Ext- LAVF +T/B  Labs: BMET  Recent Labs Lab 02/21/13 0440 02/22/13 0450 02/23/13 0420 02/24/13 0500 02/25/13 0455 02/26/13 0500 02/27/13 0455  NA 135 132* 134* 133* 136 134* 137  K 4.2 4.3 3.8 4.0 4.2 3.8 3.8  CL 98 94* 94* 94* 97 96 98  CO2 23 20 24 24 26 21 25   GLUCOSE 278* 334* 207* 180* 211* 208* 165*  BUN 103* 116* 133* 103* 124* 91* 97*  CREATININE 6.86* 7.06* 7.59* 6.44* 7.35* 6.38* 6.54*  ALBUMIN 3.2* 3.0* 3.1* 3.0* 2.9* 3.0* 2.9*  CALCIUM 9.5 9.3 9.4 9.1 9.0 9.2 9.5  PHOS 5.6* 5.5* 7.0* 6.5* 6.7* 5.1* 5.0*   CBC  Recent Labs Lab 02/21/13 0459 02/25/13 0932  WBC 12.2* 10.9*  HGB 10.9* 10.9*  HCT 33.2* 33.6*  MCV 83.2 83.6  PLT 262 234    @IMGRELPRIORS @ Medications:    . atorvastatin  80 mg Oral q1800  . calcitRIOL  0.5 mcg Oral Daily  . carvedilol  12.5 mg Oral BID WC  . Chlorhexidine Gluconate Cloth  6 each Topical Q0600  . clopidogrel  75 mg Oral Daily  . darbepoetin (ARANESP) injection - NON-DIALYSIS  100 mcg Subcutaneous Q Wed-1800  . heparin  5,000 Units Subcutaneous Q8H  . hydrALAZINE  50 mg Oral TID  . insulin aspart  0-20 Units Subcutaneous TID WC  . insulin aspart  0-5 Units Subcutaneous QHS  . insulin aspart  5 Units Subcutaneous TID WC  . insulin detemir  75 Units Subcutaneous BID  . isosorbide mononitrate  60 mg Oral Daily  . mupirocin ointment   Nasal BID  . sevelamer carbonate  1,600 mg Oral TID WC  .  sodium chloride  3 mL Intravenous Q12H  . tiotropium  18 mcg Inhalation Daily     Assessment/ Plan:   Assessment/Plan:  1. New ESRD- Had first HD 02/23/13. Will plan qod due to new AVF. Have sent out information for outpt HD and is set up for [facility] next week. HD today 2. Acute on chronic diastolic CHF- for HD with UF. 3. 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 COPD- per primary svc 5. DM- per primary svc 6. OSA 7. SHPTH- on vit D. Will need to start binders. renvela 800mg  2 qac and follow 8. Vascular access- LFA AVF +T/B. Poorly maturing. VVS to perform fistulogram today. 9. Dispo- hopeful d/c today after HD if stable. [place] A 02/27/2013, 9:07 AM

## 2013-02-27 NOTE — Procedures (Signed)
Patient was seen on dialysis and the procedure was supervised. BFR 200 Via LFA AVF BP is 162/92.  Patient appears to be tolerating treatment well

## 2013-02-27 NOTE — Progress Notes (Signed)
Family Medicine Teaching Service Attending Note  I interviewed and examined patient Jonathon Bailey and reviewed their tests and x-rays.  I discussed with Dr. Ardelia Mems and reviewed their note for today.  I agree with their assessment and plan.     Additionally  Medically stable for discharge  Follow up with Renal and [facility]

## 2013-03-02 ENCOUNTER — Telehealth: Payer: Self-pay | Admitting: *Deleted

## 2013-03-02 ENCOUNTER — Other Ambulatory Visit: Payer: Self-pay | Admitting: Sports Medicine

## 2013-03-02 MED ORDER — ATORVASTATIN CALCIUM 80 MG PO TABS: 80.0000 mg | ORAL_TABLET | Freq: Every day | ORAL | Status: DC

## 2013-03-02 NOTE — Telephone Encounter (Signed)
Received prior authorization form from [facility] for Crestor  Form placed in doctor's box for completion. Please fax when complete or return to me.  Tildon Husky, RN-BSN

## 2013-03-02 NOTE — Telephone Encounter (Signed)
Changed to Lipitor 80

## 2013-03-03 NOTE — Discharge Summary (Signed)
[facility] Discharge Summary  Patient name: Jonathon Bailey Medical record number: [id number] Date of birth: 10-Feb-1956 Age: 57 y.o. Gender: male Date of Admission: 02/17/2013  Date of Discharge: 02/27/13 Admitting Physician: Zigmund Gottron, MD  Primary Care Provider: Thersa Salt, DO  Indication for Hospitalization: CHF exacerbation  Discharge Diagnoses:  1. Acute on chronic combined CHF 2. Hyperkalemia 3. Chronic kidney disease, stage 5 4. Initiation of dialysis 5. Hypertension 6. Tachycardia 7. COPD 8. Diabetes 9. CAD s/p MI, stenting 10. Sleep apnea 11. Hyperlipidemia  Brief Hospital Course:  Jonathon Bailey is a 57 y.o. year old male with known CHF who initially presented with an acute CHF exacerbation, likely secondary to non-compliance with home lasix. No evidence of fluid overload on CXR on admission, but did clinically appear fluid overloaded and has elevated pro BNP. Respiratory symptoms may also have been due to COPD exacerbation given prominent wheezing on exam at presentation.   # Exacerbation of acute on chronic combined systolic and diastolic CHF: BNP elevated on admission.  - Last echo was January 2014 and noted EF 40-45%, moderate hypokinesis of lateral myocardium, grade 2 diastolic dysfunction. - troponins were negative x 3 - patient was initially diuresed with medications, but due to rising creatinine iintiated hemodialysis during this hospitalization so diuretics were stopped - his pulmonary status improved and he was deemed ready for discharge on 6/6 - was down over 15L at time of discharge, and back to his dry weight.   # Hyperkalemia: initially had elevated potassium which resolved on its own. Home K supplementation was discontinued.    # CKD: Had fistula placed earlier this year. Creatinine elevated on admission and rose with diuresis. Patient started HD during this admission and was able to use his fistula. There were  concerns about how well his fistula was working so a fistulagram will be performed as an outpatient. Prior to discharge patient was set up with outpatient dialysis.  # Hypertension: Fairly well controlled, but requires multilple agents chronically. We decreased patient's carvedilol dose while hospitalized due to his acute CHF exacerbation. Continued home hydralazine and imdur.   # Tachycardia: Noted to be tachycardic in low 100 range. Was monitored on telemetry. Remained hemodynamically stable.   # COPD: Treated with 5 day course of prednisone 40mg  daily as patient was noted to be wheezing on admission exam. Did not rx antibiotics. Continued home spiriva and albuterol. Recommend PFT's as an outpatient.   # Diabetes: Hgb 8.9 upon admission. Started a decreased dose of home levemir but this was gradually titrated up due to hyperglycemia. Discharged on home dose (levemir 75units BID). Sugars will need to be closely followed as an outpatient as insulin requirements may change now that patient is on dialysis.   # CAD s/p MI with stents: continued plavix, imdur  # Sleep apnea: Was given CPAP while hospitalized  # HLD: continued statin   # Code Status: was full code per patient's wishes  Consultations: nephrology, vascular surgery  Procedures: initiation of hemodialysis  Significant Labs and Imaging:   CBC  Lab  02/21/13 0459  02/25/13 0932   WBC  12.2*  10.9*   HGB  10.9*  10.9*   HCT  33.2*  33.6*   PLT  262  234    BMET   Lab  02/25/13 0455  02/26/13 0500  02/27/13 0455   NA  136  134*  137   K  4.2  3.8  3.8  CL  97  96  98   CO2  26  21  25    BUN  124*  91*  97*   CREATININE  7.35*  6.38*  6.54*   GLUCOSE  211*  208*  165*   CALCIUM  9.0  9.2  9.5    A1c 8.9   Discharge Medications: Sevelamer carbonate 800mg  tablet : 2 tabs TID with meals Albuterol 2 puff q4h prn wheezing, SOB Bisacodyl 5mg  EC tablet: 1 tab PO daily prn constipation Calcitriol 0.25mg  tablet: take  1 tablet one day, 2 tablets the next day alternating Carvedilol 25mg  tablet: take 1.5 tablet BID with meals Plavix 75mg  tablet: 1 tab PO daily Flonase 2 sprays into nose daily Hydralazine 50mg  PO TID Levemir 75 units BID Isosorbide mononitrate 60mg  24hr tablet: take 1 tab PO daily Crestor 40mg  PO QHS Spiriva 11mcg inhaled daily  Issues for Follow Up:  - will need continued dialysis as an outpatient - f/u on blood pressure as medication requirements will likely change as patient's body adjusts to starting dialysis - f/u on CBG's, insulin requirements - may need to change dosing as outpatient - recommend outpatient PFT's - f/u tachycardia - will have fistulagram as an outpatient  Outstanding Results: none      Follow-up Information   Follow up with Thersa Salt, DO On 03/06/2013. (at [id number]. If this conflicts with your dialysis schedule, please call the [facility] to reschedule this appointment.)    Contact information:   [place] [address] [phone]       Discharge Condition: Stable  Discharge Disposition: Home  Discharge Instructions: Please refer to Patient Instructions section of EMR for full details.  Patient was counseled important signs and symptoms that should prompt return to medical care, changes in medications, dietary instructions, activity restrictions, and follow up appointments.    Chrisandra Netters, MD Family Medicine PGY-1

## 2013-03-05 ENCOUNTER — Other Ambulatory Visit: Payer: Self-pay

## 2013-03-05 ENCOUNTER — Other Ambulatory Visit (HOSPITAL_COMMUNITY): Payer: Self-pay | Admitting: Nephrology

## 2013-03-05 ENCOUNTER — Other Ambulatory Visit: Payer: Self-pay | Admitting: Radiology

## 2013-03-05 ENCOUNTER — Encounter (HOSPITAL_COMMUNITY): Payer: Self-pay | Admitting: Pharmacy Technician

## 2013-03-05 DIAGNOSIS — N186 End stage renal disease: Secondary | ICD-10-CM

## 2013-03-06 ENCOUNTER — Encounter (HOSPITAL_COMMUNITY): Payer: Self-pay

## 2013-03-06 ENCOUNTER — Ambulatory Visit (HOSPITAL_COMMUNITY)
Admission: RE | Admit: 2013-03-06 | Discharge: 2013-03-06 | Disposition: A | Discharge status: Home / Self Care | Payer: Medicare Other | Source: Ambulatory Visit | Attending: Nephrology | Admitting: Nephrology

## 2013-03-06 ENCOUNTER — Ambulatory Visit (INDEPENDENT_AMBULATORY_CARE_PROVIDER_SITE_OTHER): Payer: Medicare Other | Admitting: Family Medicine

## 2013-03-06 ENCOUNTER — Other Ambulatory Visit (HOSPITAL_COMMUNITY): Payer: Self-pay | Admitting: Nephrology

## 2013-03-06 VITALS — BP 125/82 | HR 88 | Temp 98.1°F | Resp 18 | Ht 72.0 in | Wt 295.0 lb

## 2013-03-06 DIAGNOSIS — I5042 Chronic combined systolic (congestive) and diastolic (congestive) heart failure: Secondary | ICD-10-CM

## 2013-03-06 DIAGNOSIS — G4730 Sleep apnea, unspecified: Secondary | ICD-10-CM | POA: Insufficient documentation

## 2013-03-06 DIAGNOSIS — N186 End stage renal disease: Secondary | ICD-10-CM | POA: Insufficient documentation

## 2013-03-06 DIAGNOSIS — E785 Hyperlipidemia, unspecified: Secondary | ICD-10-CM | POA: Insufficient documentation

## 2013-03-06 DIAGNOSIS — I509 Heart failure, unspecified: Secondary | ICD-10-CM | POA: Insufficient documentation

## 2013-03-06 DIAGNOSIS — Y832 Surgical operation with anastomosis, bypass or graft as the cause of abnormal reaction of the patient, or of later complication, without mention of misadventure at the time of the procedure: Secondary | ICD-10-CM | POA: Insufficient documentation

## 2013-03-06 DIAGNOSIS — I120 Hypertensive chronic kidney disease with stage 5 chronic kidney disease or end stage renal disease: Secondary | ICD-10-CM | POA: Insufficient documentation

## 2013-03-06 DIAGNOSIS — E669 Obesity, unspecified: Secondary | ICD-10-CM | POA: Insufficient documentation

## 2013-03-06 DIAGNOSIS — I252 Old myocardial infarction: Secondary | ICD-10-CM | POA: Insufficient documentation

## 2013-03-06 DIAGNOSIS — T82898A Other specified complication of vascular prosthetic devices, implants and grafts, initial encounter: Secondary | ICD-10-CM | POA: Insufficient documentation

## 2013-03-06 DIAGNOSIS — I2510 Atherosclerotic heart disease of native coronary artery without angina pectoris: Secondary | ICD-10-CM | POA: Insufficient documentation

## 2013-03-06 DIAGNOSIS — Z9861 Coronary angioplasty status: Secondary | ICD-10-CM | POA: Insufficient documentation

## 2013-03-06 DIAGNOSIS — Z8673 Personal history of transient ischemic attack (TIA), and cerebral infarction without residual deficits: Secondary | ICD-10-CM | POA: Insufficient documentation

## 2013-03-06 DIAGNOSIS — E119 Type 2 diabetes mellitus without complications: Secondary | ICD-10-CM | POA: Insufficient documentation

## 2013-03-06 DIAGNOSIS — Z87891 Personal history of nicotine dependence: Secondary | ICD-10-CM | POA: Insufficient documentation

## 2013-03-06 DIAGNOSIS — M129 Arthropathy, unspecified: Secondary | ICD-10-CM | POA: Insufficient documentation

## 2013-03-06 LAB — CBC
MCH: 27 pg (ref 26.0–34.0)
MCHC: 31.2 g/dL (ref 30.0–36.0)
MCV: 86.4 fL (ref 78.0–100.0)
Platelets: 195 10*3/uL (ref 150–400)
RBC: 3.67 MIL/uL — ABNORMAL LOW (ref 4.22–5.81)

## 2013-03-06 MED ORDER — IOHEXOL 300 MG/ML  SOLN
100.0000 mL | Freq: Once | INTRAMUSCULAR | Status: AC | PRN
  Administered 2013-03-06: 40 mL via INTRAVENOUS

## 2013-03-06 MED ORDER — MIDAZOLAM HCL 2 MG/2ML IJ SOLN
INTRAMUSCULAR | Status: AC | PRN
  Administered 2013-03-06: 2 mg via INTRAVENOUS

## 2013-03-06 MED ORDER — DEXTROSE 5 % IV SOLN
3.0000 g | Freq: Once | INTRAVENOUS | Status: AC
  Administered 2013-03-06: 3 g via INTRAVENOUS
  Filled 2013-03-06: qty 3000

## 2013-03-06 MED ORDER — HEPARIN SODIUM (PORCINE) 1000 UNIT/ML IJ SOLN
INTRAMUSCULAR | Status: AC
  Filled 2013-03-06: qty 1

## 2013-03-06 MED ORDER — FENTANYL CITRATE 0.05 MG/ML IJ SOLN
INTRAMUSCULAR | Status: AC | PRN
  Administered 2013-03-06: 50 ug via INTRAVENOUS

## 2013-03-06 MED ORDER — FENTANYL CITRATE 0.05 MG/ML IJ SOLN
INTRAMUSCULAR | Status: AC
  Filled 2013-03-06: qty 4

## 2013-03-06 MED ORDER — MIDAZOLAM HCL 2 MG/2ML IJ SOLN
INTRAMUSCULAR | Status: AC
  Filled 2013-03-06: qty 4

## 2013-03-06 NOTE — ED Notes (Signed)
Site of HD cath placement covered with gauze and transparent dressing.  Dressing is clean dry and intact.  Discharge instructions reviewed with pt.

## 2013-03-06 NOTE — H&P (Signed)
Jonathon Bailey is an 57 y.o. male.   Chief Complaint: ESRD Left arm fistula continual difficult cannulization; unable to use  Scheduled for tunnelled hemodialysis catheter. Pt scheduled for fistula evaluation with Dr Trula Slade next week HPI: ESRD; DM; sleep apnea; HTN; HLD; CAD/MI; CVA; CHF  Past Medical History  Diagnosis Date  . Diabetes mellitus   . Hyperlipidemia   . Hypertension   . Myocardial infarction     status post MI x2 and 3 stents placed in 2003  . Stroke ~ 2007; ~1987    "weak on right side; messed w/right side of brain; cry all the time"  . Sleep apnea     "sleep w/CPAP sometimes"  . Arthritis   . Anxiety   . Depression   . Chronic systolic heart failure [id number]    ECHO Feb 2013 showed LVEF low normal at 50-55%, +hypokinetic anterolateral wall and inferolateral wall.    . CKD (chronic kidney disease) stage 4, GFR 15-29 ml/min 08/30/2009    Progressive renal failure since 2008, creatinine 1.2 in 2008 up to 3.5 in 2012 and 3.2-5.0 in 2013. All UA's 2011-13 showed >300 protein on dipstick. Work-up in May 2011 showed negative Urine IFE and SPEP, ultrasound showed 12-13 cm kidneys with increased echogenicity and UPC ratio was 1.5 gm proteinuria.  Hgb A1C's from 2011 to 2013 were all between 9-11.  Patient saw Dr. Donnetta Hutching (vasc surgery) for HD access in Aug 2013 > vein mapping was done and Dr. Donnetta Hutching felt the left arm (pt is R handed) was suitable for L arm Cimino radiocephalic fistula. Patient said he wasn't ready to consider doing dialysis and declined the surgery.       Past Surgical History  Procedure Laterality Date  . Coronary stent placement    . Hernia repair      umbilical  . Coronary angioplasty with stent placement  ~ 2002    "3"  . Av fistula placement  10/17/2012    Procedure: ARTERIOVENOUS (AV) FISTULA CREATION;  Surgeon: Mal Misty, MD;  Location: [facility] OR;  Service: Vascular;  Laterality: Left;    Family History  Problem Relation Age of Onset  .  Asthma Mother   . Hyperlipidemia Mother   . Hypertension Mother   . Stroke Father   . Heart attack Father   . Prostate cancer Father   . Deep vein thrombosis Father   . Cancer Father   . Diabetes Father   . Hyperlipidemia Father   . Hypertension Father   . Other Father     varicose veins  . Heart disease Father     before age 41  . Other Sister     varicose veins   Social History:  reports that he quit smoking about 26 years ago. His smoking use included Cigarettes. He has a 15 pack-year smoking history. He has never used smokeless tobacco. He reports that he drinks about 1.2 ounces of alcohol per week. He reports that he does not use illicit drugs.  Allergies:  Allergies  Allergen Reactions  . Feraheme (Ferumoxytol) Itching    Severe itching.     (Not in a hospital admission)  Results for orders placed during the hospital encounter of 03/06/13 (from the past 48 hour(s))  GLUCOSE, CAPILLARY     Status: Abnormal   Collection Time    03/06/13 11:00 AM      Result Value Range   Glucose-Capillary 190 (*) 70 - 99 mg/dL   No results found.  Review of Systems  Constitutional: Negative for fever.  Respiratory: Positive for cough and sputum production.   Cardiovascular: Negative for chest pain.  Gastrointestinal: Negative for nausea, vomiting and abdominal pain.  Neurological: Negative for headaches.    There were no vitals taken for this visit. Physical Exam  Constitutional: He is oriented to person, place, and time.  obese  Cardiovascular: Normal rate, regular rhythm and normal heart sounds.   No murmur heard. Respiratory: Effort normal and breath sounds normal.  GI: Soft. Bowel sounds are normal. He exhibits distension.  Musculoskeletal: Normal range of motion.  Neurological: He is alert and oriented to person, place, and time.  Psychiatric: He has a normal mood and affect. His behavior is normal. Judgment and thought content normal.     Assessment/Plan Left arm  dialysis fistula unable to use due to difficult cannulization Scheduled for HD catheter placement now Pt aware of procedure benefits and risks and agreeable to proceed Consent signed and in chart  Jamoni Hewes A 03/06/2013, 11:04 AM

## 2013-03-06 NOTE — Procedures (Signed)
Placement of right jugular dialysis catheter.  Tip in right atrium and ready to use.  No immediate complication.  See Radiology report.

## 2013-03-08 NOTE — Progress Notes (Signed)
Subjective:     Patient ID: Jonathon Bailey, male   DOB: 1956-09-12, 57 y.o.   MRN: [id number]  HPI No Show  Review of Systems     Objective:   Physical Exam     Assessment:         Plan:

## 2013-03-11 ENCOUNTER — Ambulatory Visit (HOSPITAL_COMMUNITY)
Admission: RE | Admit: 2013-03-11 | Discharge: 2013-03-11 | Disposition: A | Discharge status: Home / Self Care | Payer: Medicare Other | Source: Ambulatory Visit | Attending: Surgery | Admitting: Surgery

## 2013-03-11 ENCOUNTER — Encounter (HOSPITAL_COMMUNITY)
Admission: RE | Disposition: A | Discharge status: Home / Self Care | Payer: Self-pay | Source: Ambulatory Visit | Attending: Surgery

## 2013-03-11 DIAGNOSIS — I252 Old myocardial infarction: Secondary | ICD-10-CM | POA: Insufficient documentation

## 2013-03-11 DIAGNOSIS — Z9119 Patient's noncompliance with other medical treatment and regimen: Secondary | ICD-10-CM | POA: Insufficient documentation

## 2013-03-11 DIAGNOSIS — T82898A Other specified complication of vascular prosthetic devices, implants and grafts, initial encounter: Secondary | ICD-10-CM

## 2013-03-11 DIAGNOSIS — Z794 Long term (current) use of insulin: Secondary | ICD-10-CM | POA: Insufficient documentation

## 2013-03-11 DIAGNOSIS — Z9861 Coronary angioplasty status: Secondary | ICD-10-CM | POA: Insufficient documentation

## 2013-03-11 DIAGNOSIS — I509 Heart failure, unspecified: Secondary | ICD-10-CM | POA: Insufficient documentation

## 2013-03-11 DIAGNOSIS — E1129 Type 2 diabetes mellitus with other diabetic kidney complication: Secondary | ICD-10-CM | POA: Insufficient documentation

## 2013-03-11 DIAGNOSIS — I69959 Hemiplegia and hemiparesis following unspecified cerebrovascular disease affecting unspecified side: Secondary | ICD-10-CM | POA: Insufficient documentation

## 2013-03-11 DIAGNOSIS — G4730 Sleep apnea, unspecified: Secondary | ICD-10-CM | POA: Insufficient documentation

## 2013-03-11 DIAGNOSIS — Z91199 Patient's noncompliance with other medical treatment and regimen due to unspecified reason: Secondary | ICD-10-CM | POA: Insufficient documentation

## 2013-03-11 DIAGNOSIS — Z7902 Long term (current) use of antithrombotics/antiplatelets: Secondary | ICD-10-CM | POA: Insufficient documentation

## 2013-03-11 DIAGNOSIS — N186 End stage renal disease: Secondary | ICD-10-CM | POA: Insufficient documentation

## 2013-03-11 DIAGNOSIS — I120 Hypertensive chronic kidney disease with stage 5 chronic kidney disease or end stage renal disease: Secondary | ICD-10-CM | POA: Insufficient documentation

## 2013-03-11 DIAGNOSIS — Z79899 Other long term (current) drug therapy: Secondary | ICD-10-CM | POA: Insufficient documentation

## 2013-03-11 DIAGNOSIS — Z87891 Personal history of nicotine dependence: Secondary | ICD-10-CM | POA: Insufficient documentation

## 2013-03-11 DIAGNOSIS — I2510 Atherosclerotic heart disease of native coronary artery without angina pectoris: Secondary | ICD-10-CM | POA: Insufficient documentation

## 2013-03-11 HISTORY — PX: SHUNTOGRAM: SHX5491

## 2013-03-11 LAB — POCT I-STAT, CHEM 8
BUN: 44 mg/dL — ABNORMAL HIGH (ref 6–23)
Calcium, Ion: 1.1 mmol/L — ABNORMAL LOW (ref 1.12–1.23)
Chloride: 104 mEq/L (ref 96–112)
Creatinine, Ser: 6.4 mg/dL — ABNORMAL HIGH (ref 0.50–1.35)
Glucose, Bld: 139 mg/dL — ABNORMAL HIGH (ref 70–99)
TCO2: 28 mmol/L (ref 0–100)

## 2013-03-11 SURGERY — ASSESSMENT, SHUNT FUNCTION, WITH CONTRAST RADIOGRAPHIC STUDY
Anesthesia: LOCAL

## 2013-03-11 MED ORDER — SODIUM CHLORIDE 0.9 % IJ SOLN: 3.0000 mL | Freq: Two times a day (BID) | INTRAMUSCULAR | Status: DC

## 2013-03-11 MED ORDER — DIPHENHYDRAMINE HCL 50 MG/ML IJ SOLN
INTRAMUSCULAR | Status: AC
  Filled 2013-03-11: qty 1

## 2013-03-11 MED ORDER — METHYLPREDNISOLONE SODIUM SUCC 125 MG IJ SOLR
INTRAMUSCULAR | Status: AC
  Filled 2013-03-11: qty 2

## 2013-03-11 MED ORDER — LIDOCAINE HCL (PF) 1 % IJ SOLN
INTRAMUSCULAR | Status: AC
  Filled 2013-03-11: qty 30

## 2013-03-11 MED ORDER — HEPARIN (PORCINE) IN NACL 2-0.9 UNIT/ML-% IJ SOLN
INTRAMUSCULAR | Status: AC
  Filled 2013-03-11: qty 500

## 2013-03-11 MED ORDER — FAMOTIDINE IN NACL 20-0.9 MG/50ML-% IV SOLN
INTRAVENOUS | Status: AC
  Filled 2013-03-11: qty 50

## 2013-03-11 MED ORDER — SODIUM CHLORIDE 0.9 % IJ SOLN: 3.0000 mL | INTRAMUSCULAR | Status: DC | PRN

## 2013-03-11 MED ORDER — SODIUM CHLORIDE 0.9 % IV SOLN: 250.0000 mL | INTRAVENOUS | Status: DC | PRN

## 2013-03-11 MED ORDER — ONDANSETRON HCL 4 MG/2ML IJ SOLN: 4.0000 mg | Freq: Four times a day (QID) | INTRAMUSCULAR | Status: DC | PRN

## 2013-03-11 MED ORDER — ACETAMINOPHEN 325 MG PO TABS: 650.0000 mg | ORAL_TABLET | ORAL | Status: DC | PRN

## 2013-03-11 NOTE — Op Note (Signed)
OPERATIVE NOTE   PROCEDURE: 1. left radiocephalic arteriovenous fistula cannulation under ultrasound guidance 2. Left arm fistulogram  PRE-OPERATIVE DIAGNOSIS: Malfunctioning left radiocephalic arteriovenous fistula  POST-OPERATIVE DIAGNOSIS: same as above   SURGEON: Adele Barthel, MD  ANESTHESIA: local  ESTIMATED BLOOD LOSS: 5 cc  FINDING(S): 1. Widely patent fistula throughout including anastomosis 2. Fistula only 4.5 mm at maximum  SPECIMEN(S):  None  CONTRAST: 35 cc  INDICATIONS: Jonathon Bailey is a 57 y.o. male who  presents with malfunctioning left radiocephalic arteriovenous fistula.  The patient is scheduled for left arm fistulogram.  The patient is aware the risks include but are not limited to: bleeding, infection, thrombosis of the cannulated access, and possible anaphylactic reaction to the contrast.  The patient is aware of the risks of the procedure and elects to proceed forward.  DESCRIPTION: After full informed written consent was obtained, the patient was brought back to the angiography suite and placed supine upon the angiography table.  The patient was connected to monitoring equipment.  The left arm was prepped and draped in the standard fashion for a left arm fistulogram.  Under ultrasound guidance, the left radiocephalic arteriovenous fistula was cannulated with a micropuncture needle.  The microwire was advanced into the fistula and the needle was exchanged for the a microsheath, which was lodged 2 cm into the access.  The wire was removed and the sheath was connected to the IV extension tubing.  Hand injections were completed to image the access from the antecubitum up to the level of axilla.  The central venous structures were also imaged by hand injections.  Based on the images, this patient will need: ligation of left radiocephalic arteriovenous fistula and placement of brachiocephalic arteriovenous fistula  .  A 4-0 Monocryl purse-string suture was sewn  around the sheath.  The sheath was removed while tying down the suture.  A sterile bandage was applied to the puncture site.  COMPLICATIONS: none  CONDITION: stable  Adele Barthel, MD [facility] Office: [phone] Pager: [phone]  03/11/2013 1:27 PM

## 2013-03-11 NOTE — Interval H&P Note (Signed)
[facility]  History and Physical Update  The patient was interviewed and re-examined.  The patient's previous History and Physical has been reviewed and is unchanged from Dr. Stephens Shire consult on: 02/26/13.  There is no change in the plan of care: L RC AVF cannulation, L arm fistulogram, possible intervention.  Adele Barthel, MD [facility] Office: [phone] Pager: [phone]  03/11/2013, 12:50 PM

## 2013-03-11 NOTE — H&P (View-Only) (Signed)
[facility]      Consult Note  Patient name: Jonathon Bailey MRN: [id number] DOB: Sep 06, 1956 Sex: male  Consulting Physician:  Family medicine  Reason for Consult:  Chief Complaint  Patient presents with  . Shortness of Breath    HISTORY OF PRESENT ILLNESS: This is a 57 year old gentleman who is status post left radiocephalic fistula by Dr. Kellie Simmering in January of 2014. Dr. Kellie Simmering most recently saw the patient in April of this year and felt that the fistula was able to be cannulated. At that time the patient was not yet on dialysis. The patient was admitted to the hospital with an acute CHF exacerbation. This was felt to be secondary to medical noncompliance as he had not been taking his Lasix. The patient has gone on to need a renal replacement therapy. He has undergone successful dialysis via the left radiocephalic fistula on 2 separate occasions, however there was reported difficulty with his access. I have been asked to evaluate this.  The patient's renal failure secondary to diabetes and hypertension. The patient has a history of coronary artery disease. He has had an MI x2. He has undergone PCI in 2003. His most recent echo showed an ejection fraction of 50-55%. He is also had a stroke with residual right-sided weakness. The patient is morbidly obese. He suffers from sleep apnea and uses a CPAP at night.  Past Medical History  Diagnosis Date  . Diabetes mellitus   . Hyperlipidemia   . Hypertension   . Myocardial infarction     status post MI x2 and 3 stents placed in 2003  . Stroke ~ 2007; ~1987    "weak on right side; messed w/right side of brain; cry all the time"  . Sleep apnea     "sleep w/CPAP sometimes"  . Arthritis   . Anxiety   . Depression   . Chronic systolic heart failure [id number]    ECHO Feb 2013 showed LVEF low normal at 50-55%, +hypokinetic anterolateral wall and inferolateral wall.    . CKD (chronic kidney disease) stage 4, GFR  15-29 ml/min 08/30/2009    Progressive renal failure since 2008, creatinine 1.2 in 2008 up to 3.5 in 2012 and 3.2-5.0 in 2013. All UA's 2011-13 showed >300 protein on dipstick. Work-up in May 2011 showed negative Urine IFE and SPEP, ultrasound showed 12-13 cm kidneys with increased echogenicity and UPC ratio was 1.5 gm proteinuria.  Hgb A1C's from 2011 to 2013 were all between 9-11.  Patient saw Dr. Donnetta Hutching (vasc surgery) for HD access in Aug 2013 > vein mapping was done and Dr. Donnetta Hutching felt the left arm (pt is R handed) was suitable for L arm Cimino radiocephalic fistula. Patient said he wasn't ready to consider doing dialysis and declined the surgery.       Past Surgical History  Procedure Laterality Date  . Coronary stent placement    . Hernia repair      umbilical  . Coronary angioplasty with stent placement  ~ 2002    "3"  . Av fistula placement  10/17/2012    Procedure: ARTERIOVENOUS (AV) FISTULA CREATION;  Surgeon: Mal Misty, MD;  Location: [facility];  Service: Vascular;  Laterality: Left;    History   Social History  . Marital Status: Divorced    Spouse Name: N/A    Number of Children: N/A  . Years of Education: N/A   Occupational History  . Not on file.   Social History Main  Topics  . Smoking status: Former Smoker -- 1.00 packs/day for 15 years    Types: Cigarettes    Quit date: 09/24/1986  . Smokeless tobacco: Never Used  . Alcohol Use: 1.2 oz/week    2 Shots of liquor per week  . Drug Use: No  . Sexually Active: Not Currently   Other Topics Concern  . Not on file   Social History Narrative  . No narrative on file    Family History  Problem Relation Age of Onset  . Asthma Mother   . Hyperlipidemia Mother   . Hypertension Mother   . Stroke Father   . Heart attack Father   . Prostate cancer Father   . Deep vein thrombosis Father   . Cancer Father   . Diabetes Father   . Hyperlipidemia Father   . Hypertension Father   . Other Father     varicose veins  .  Heart disease Father     before age 56  . Other Sister     varicose veins    Allergies as of 02/17/2013 - Review Complete 02/17/2013  Allergen Reaction Noted  . Feraheme (ferumoxytol) Itching 10/16/2012    No current facility-administered medications on file prior to encounter.   Current Outpatient Prescriptions on File Prior to Encounter  Medication Sig Dispense Refill  . albuterol (PROVENTIL HFA;VENTOLIN HFA) 108 (90 BASE) MCG/ACT inhaler Inhale 2 puffs into the lungs every 4 (four) hours as needed. For shortness of breath and wheezing  1 Inhaler  2  . Blood Glucose Monitoring Suppl (ACCU-CHEK AVIVA PLUS) W/DEVICE KIT USE TO CHECK BLOOD SUGAR 6 TIMES DAILY  1 kit  0  . calcitRIOL (ROCALTROL) 0.25 MCG capsule Take 0.25-0.5 mcg by mouth daily. Alternates takes 1 tablet and 2 capsules the next day      . carvedilol (COREG) 25 MG tablet Take 37.5 mg by mouth 2 (two) times daily with a meal.      . clopidogrel (PLAVIX) 75 MG tablet Take 1 tablet (75 mg total) by mouth daily.  30 tablet  5  . fluticasone (FLONASE) 50 MCG/ACT nasal spray Place 2 sprays into the nose daily.  16 g  0  . furosemide (LASIX) 80 MG tablet Take 2 tablets (160 mg total) by mouth 3 (three) times daily.  180 tablet  3  . glucose blood (ACCU-CHEK AVIVA PLUS) test strip Use to check blood sugars six times a day.  DX: 250.02  100 each  12  . hydrALAZINE (APRESOLINE) 50 MG tablet Take 1 tablet (50 mg total) by mouth 3 (three) times daily.  90 tablet  3  . insulin detemir (LEVEMIR) 100 UNIT/ML injection Inject 75 Units into the skin 2 (two) times daily. Please give pen if covered. 3 month supply.  10 mL  12  . Insulin Syringe-Needle U-100 25G X 1" 1 ML MISC 1 applicator by Does not apply route 2 (two) times daily. Please give needles 4 Levemir.  200 each  0  . isosorbide mononitrate (IMDUR) 60 MG 24 hr tablet Take 60 mg by mouth daily.      . potassium chloride SA (K-DUR,KLOR-CON) 20 MEQ tablet Take 1 tablet (20 mEq total) by  mouth daily.  30 tablet  3  . rosuvastatin (CRESTOR) 40 MG tablet Take 1 tablet (40 mg total) by mouth at bedtime.  30 tablet  6  . tiotropium (SPIRIVA HANDIHALER) 18 MCG inhalation capsule Place 1 capsule (18 mcg total) into inhaler and inhale daily.  30 capsule  3     REVIEW OF SYSTEMS: Positive for shortness of breath, lower extremity edema. Negative for fevers chills, nausea vomiting. All other systems are negative  PHYSICAL EXAMINATION: General: The patient appears their stated age.  Vital signs are BP 168/93  Pulse 115  Temp(Src) 98.2 F (36.8 C) (Oral)  Resp 20  Ht 6' (1.829 m)  Wt 294 lb (133.358 kg)  BMI 39.86 kg/m2  SpO2 93% Pulmonary: Respirations are non-labored HEENT:  No gross abnormalities Abdomen: Soft and non-tender   Musculoskeletal: There are no major deformities.   Neurologic: No focal weakness or paresthesias are detected, Skin: There are no ulcer or rashes noted. Psychiatric: The patient has normal affect. Cardiovascular: There is a regular rate and rhythm without significant murmur appreciated. Palpable thrill within left radiocephalic fistula, however the fistula does appear to be small in caliber  Diagnostic Studies: None    Assessment:  End-stage renal disease with poorly functioning left radiocephalic fistula Plan: The patient will be scheduled for a fistulogram tomorrow morning. He'll be made n.p.o. after midnight. I discussed the details of this procedure with the patient and he is willing to proceed. I spoke with the dialysis unit. They are going to dialyze him first thing in the morning at 5 AM so that he would be ready for a procedure as early as 9 AM     V. Leia Alf, M.D. [facility] Office: [phone] Pager:  [phone]

## 2013-03-20 ENCOUNTER — Telehealth: Payer: Self-pay | Admitting: *Deleted

## 2013-03-20 NOTE — Telephone Encounter (Signed)
I called Jonathon Bailey today and he is not interested in preceeding with surgery at this time. I told him we would call to set up an appt in 4-6 weeks with Dr Bridgett Larsson for him.

## 2013-03-20 NOTE — Telephone Encounter (Signed)
Message copied by Alfonso Patten on Fri Mar 20, 2013 10:35 AM ------      Message from: Conrad [place]      Created: Wed Mar 11, 2013  1:30 PM       RIAL SABIR      [id number]      12/31/1955            Procedure:       1.  Cannulation of left radiocephalic arteriovenous fistula        under ultrasound guidance      2.  Left arm fistulogram            Patient will need ligation of left radiocephalic arteriovenous fistula        and placement of left brachiocephalic arteriovenous fistula.            He is considering proceeding but has not answered in an affirmative fashion.  Ok to offer it to him next week, if he isn't interested he can follow up in 4-6 weeks. ------

## 2013-03-23 ENCOUNTER — Telehealth: Payer: Self-pay | Admitting: Vascular Surgery

## 2013-03-23 NOTE — Telephone Encounter (Signed)
Called patient today to schedule fu in 4-6 wks as per staff message.  Patient does not want to return here for follow-up.

## 2013-04-08 ENCOUNTER — Telehealth: Payer: Self-pay | Admitting: Family Medicine

## 2013-04-08 NOTE — Telephone Encounter (Signed)
error 

## 2013-04-23 ENCOUNTER — Other Ambulatory Visit (HOSPITAL_COMMUNITY): Payer: Self-pay | Admitting: Nephrology

## 2013-04-23 DIAGNOSIS — N186 End stage renal disease: Secondary | ICD-10-CM

## 2013-04-24 ENCOUNTER — Ambulatory Visit (HOSPITAL_COMMUNITY)
Admission: RE | Admit: 2013-04-24 | Discharge: 2013-04-24 | Disposition: A | Discharge status: Home / Self Care | Payer: Medicare Other | Source: Ambulatory Visit | Attending: Nephrology | Admitting: Nephrology

## 2013-04-24 ENCOUNTER — Other Ambulatory Visit (HOSPITAL_COMMUNITY): Payer: Self-pay | Admitting: Nephrology

## 2013-04-24 DIAGNOSIS — N186 End stage renal disease: Secondary | ICD-10-CM

## 2013-04-24 DIAGNOSIS — Y929 Unspecified place or not applicable: Secondary | ICD-10-CM | POA: Insufficient documentation

## 2013-04-24 DIAGNOSIS — T82898A Other specified complication of vascular prosthetic devices, implants and grafts, initial encounter: Secondary | ICD-10-CM | POA: Insufficient documentation

## 2013-04-24 DIAGNOSIS — Y841 Kidney dialysis as the cause of abnormal reaction of the patient, or of later complication, without mention of misadventure at the time of the procedure: Secondary | ICD-10-CM | POA: Insufficient documentation

## 2013-04-24 MED ORDER — DEXTROSE 5 % IV SOLN
3.0000 g | Freq: Once | INTRAVENOUS | Status: DC
  Filled 2013-04-24: qty 3000

## 2013-04-24 MED ORDER — HEPARIN SODIUM (PORCINE) 1000 UNIT/ML IJ SOLN
INTRAMUSCULAR | Status: AC
  Filled 2013-04-24: qty 1

## 2013-04-24 MED ORDER — CHLORHEXIDINE GLUCONATE 4 % EX LIQD
CUTANEOUS | Status: AC
  Filled 2013-04-24: qty 30

## 2013-04-24 NOTE — Progress Notes (Deleted)
Dressing on rt upper chest is clean, dry and intact

## 2013-04-24 NOTE — Procedures (Signed)
Successful fluoroscopic guided exchange of existing right jugular approach dialysis catheter.  Catheter is ready for immediate use.  No immediate post procedural complications.

## 2013-04-24 NOTE — Progress Notes (Deleted)
Pt's dressing on rt upper chest is clean, dry and intact.  Pt discharged.

## 2013-05-01 ENCOUNTER — Ambulatory Visit: Payer: Medicare Other | Admitting: Vascular Surgery

## 2013-05-18 DIAGNOSIS — E1149 Type 2 diabetes mellitus with other diabetic neurological complication: Secondary | ICD-10-CM

## 2013-05-18 DIAGNOSIS — Z7901 Long term (current) use of anticoagulants: Secondary | ICD-10-CM

## 2013-05-18 DIAGNOSIS — I82409 Acute embolism and thrombosis of unspecified deep veins of unspecified lower extremity: Secondary | ICD-10-CM

## 2013-05-18 DIAGNOSIS — Z0000 Encounter for general adult medical examination without abnormal findings: Secondary | ICD-10-CM

## 2013-05-18 DIAGNOSIS — I872 Venous insufficiency (chronic) (peripheral): Secondary | ICD-10-CM

## 2013-05-18 DIAGNOSIS — F329 Major depressive disorder, single episode, unspecified: Secondary | ICD-10-CM

## 2013-05-18 DIAGNOSIS — Z299 Encounter for prophylactic measures, unspecified: Secondary | ICD-10-CM

## 2013-05-18 DIAGNOSIS — D696 Thrombocytopenia, unspecified: Secondary | ICD-10-CM

## 2013-05-22 ENCOUNTER — Other Ambulatory Visit: Payer: Self-pay | Admitting: Family Medicine

## 2013-07-30 ENCOUNTER — Other Ambulatory Visit (HOSPITAL_COMMUNITY): Payer: Self-pay | Admitting: Family Medicine

## 2013-08-07 ENCOUNTER — Other Ambulatory Visit: Payer: Self-pay | Admitting: Family Medicine

## 2013-08-12 ENCOUNTER — Encounter: Payer: Self-pay | Admitting: Family Medicine

## 2013-08-12 ENCOUNTER — Ambulatory Visit (INDEPENDENT_AMBULATORY_CARE_PROVIDER_SITE_OTHER): Payer: Medicare Other | Admitting: Family Medicine

## 2013-08-12 VITALS — BP 137/78 | HR 71 | Temp 99.0°F | Ht 72.0 in | Wt 295.0 lb

## 2013-08-12 DIAGNOSIS — I10 Essential (primary) hypertension: Secondary | ICD-10-CM

## 2013-08-12 DIAGNOSIS — E118 Type 2 diabetes mellitus with unspecified complications: Secondary | ICD-10-CM

## 2013-08-12 DIAGNOSIS — I5042 Chronic combined systolic (congestive) and diastolic (congestive) heart failure: Secondary | ICD-10-CM

## 2013-08-12 DIAGNOSIS — N186 End stage renal disease: Secondary | ICD-10-CM

## 2013-08-12 MED ORDER — SILDENAFIL CITRATE 50 MG PO TABS
50.0000 mg | ORAL_TABLET | Freq: Every day | ORAL | Status: DC | PRN
Start: 2013-08-12 — End: 2013-08-12

## 2013-08-12 MED ORDER — ROSUVASTATIN CALCIUM 40 MG PO TABS: 40.0000 mg | ORAL_TABLET | Freq: Every day | ORAL | Status: DC

## 2013-08-12 MED ORDER — INSULIN PEN NEEDLE 31G X 8 MM MISC: Status: DC

## 2013-08-12 NOTE — Patient Instructions (Signed)
It was nice to see you today.  Continue taking your medication.  Add on the novolog.  Take 20 Units with the largest meal of the day.  Follow up in 1-3 months to discuss diabetes.

## 2013-08-13 NOTE — Assessment & Plan Note (Signed)
Euvolumeic on exam today.  Patient doing well at this time.

## 2013-08-13 NOTE — Assessment & Plan Note (Signed)
Well controlled.  Will continue Coreg.

## 2013-08-13 NOTE — Assessment & Plan Note (Signed)
Diabetes continues to be uncontrolled. Will continue Levemir.  Will add Novolog 20 units with the largest meal. Patient to follow up in 1-3 months for reassessment.

## 2013-08-13 NOTE — Progress Notes (Signed)
Subjective:     Patient ID: Jonathon Bailey, male   DOB: 1956-07-14, 57 y.o.   MRN: [id number]  HPI 57 year old male with an extensive PMH presents for follow up.  1) ESRD  - Patient is now on HD [facility] Ship, Thursday, Saturday) - He is doing well and tolerating HD well. - He is followed by nephrology Dr. Littie Deeds   2) HTN Disease Monitoring: Home BP Monitoring  - No. Chest pain- No    Dyspnea- He does have some intermittent SOB Medications: Patient is now only on Coreg. Hydralazine has been discontinued.  Compliance-  Yes.   3) Diabetes mellitus, type 2 Disease Monitoring  Blood Sugar Ranges: Patient did not bring meter or log.  Unclear of his blood sugar readings.  Reports that they are often elevated after eating.  Polyuria: no   Visual problems: no   Medication Compliance: yes. Reports that he is taking Levemir 80 units BID. Medication Side Effects  Hypoglycemia: no   4) CHF - Patient is now off of lasix since starting HD. - No reported increasing SOB or LE edema. - He states that he is doing very well.   Review of Systems Per HPI    Objective:   Physical Exam Filed Vitals:   08/12/13 1613  BP: 137/78  Pulse: 71  Temp: 99 F (37.2 C)   Exam: General: well appearing obese male in NAD. Cardiovascular: RRR. No murmurs, rubs, or gallops. Respiratory: CTAB. No rales, rhonchi, or wheeze. Abdomen: obese, soft, nontender, nondistended. Extremities: 1+ LE edema bilaterally.     Assessment:    See Problem List    Plan:

## 2013-08-13 NOTE — Assessment & Plan Note (Signed)
Tolerating and doing well. Following with Dr. Marval Regal.

## 2013-08-28 ENCOUNTER — Other Ambulatory Visit: Payer: Self-pay | Admitting: Vascular Surgery

## 2013-08-28 DIAGNOSIS — N186 End stage renal disease: Secondary | ICD-10-CM

## 2013-08-28 DIAGNOSIS — Z01810 Encounter for preprocedural cardiovascular examination: Secondary | ICD-10-CM

## 2013-09-18 ENCOUNTER — Ambulatory Visit: Payer: Medicare Other | Admitting: Vascular Surgery

## 2013-09-18 ENCOUNTER — Other Ambulatory Visit (HOSPITAL_COMMUNITY): Payer: Medicare Other

## 2013-09-18 ENCOUNTER — Encounter (HOSPITAL_COMMUNITY): Payer: Medicare Other

## 2013-09-22 ENCOUNTER — Telehealth: Payer: Self-pay | Admitting: Family Medicine

## 2013-09-22 NOTE — Telephone Encounter (Signed)
Called patient back and told him he will need an appointment to be seen. I offered him a SDA appointment today and told him he may have to wait up to a couple of hours or he can go to urgent care. Patient decided to be seen at the urgent care center.Jonathon Bailey, [name]

## 2013-09-22 NOTE — Telephone Encounter (Signed)
Pt called because the have flu symptoms and would like Dr. Lacinda Axon to call in some antibiotics. jw

## 2013-09-30 ENCOUNTER — Other Ambulatory Visit (HOSPITAL_COMMUNITY): Payer: Self-pay | Admitting: Cardiovascular Disease

## 2013-09-30 NOTE — Telephone Encounter (Signed)
Rx was sent to pharmacy electronically. 

## 2013-10-02 ENCOUNTER — Telehealth: Payer: Self-pay | Admitting: Family Medicine

## 2013-10-02 NOTE — Telephone Encounter (Signed)
Pt was advised to schedule an appointment to be seen with [place] or if no availability to go to urgent care.he voiced understanding. Jonathon Bailey, Lewie Loron

## 2013-10-02 NOTE — Telephone Encounter (Signed)
[phone] Please call on cell phone Pt has flu symptons. Wants an antibotic Ran out blood pressure pills 2 days ago Please advise

## 2013-10-07 ENCOUNTER — Ambulatory Visit: Payer: Medicare Other | Admitting: Family Medicine

## 2013-12-02 ENCOUNTER — Ambulatory Visit (INDEPENDENT_AMBULATORY_CARE_PROVIDER_SITE_OTHER): Payer: Medicare Other | Admitting: Family Medicine

## 2013-12-02 ENCOUNTER — Encounter: Payer: Self-pay | Admitting: Family Medicine

## 2013-12-02 VITALS — BP 151/87 | HR 82 | Temp 98.7°F | Ht 72.0 in | Wt 297.0 lb

## 2013-12-02 DIAGNOSIS — Z23 Encounter for immunization: Secondary | ICD-10-CM

## 2013-12-02 DIAGNOSIS — I5042 Chronic combined systolic (congestive) and diastolic (congestive) heart failure: Secondary | ICD-10-CM

## 2013-12-02 DIAGNOSIS — N186 End stage renal disease: Secondary | ICD-10-CM

## 2013-12-02 DIAGNOSIS — I10 Essential (primary) hypertension: Secondary | ICD-10-CM

## 2013-12-02 DIAGNOSIS — E118 Type 2 diabetes mellitus with unspecified complications: Secondary | ICD-10-CM

## 2013-12-02 DIAGNOSIS — Z1211 Encounter for screening for malignant neoplasm of colon: Secondary | ICD-10-CM

## 2013-12-02 DIAGNOSIS — Z992 Dependence on renal dialysis: Secondary | ICD-10-CM

## 2013-12-02 DIAGNOSIS — I509 Heart failure, unspecified: Secondary | ICD-10-CM

## 2013-12-02 DIAGNOSIS — Z0000 Encounter for general adult medical examination without abnormal findings: Secondary | ICD-10-CM

## 2013-12-02 LAB — IRON: Iron: 105 ug/dL (ref 42–165)

## 2013-12-02 LAB — LIPID PANEL
Cholesterol: 175 mg/dL (ref 0–200)
HDL: 30 mg/dL — ABNORMAL LOW (ref >39)
TRIGLYCERIDES: 509 mg/dL — AB (ref <150)
Total CHOL/HDL Ratio: 5.8 Ratio

## 2013-12-02 LAB — FERRITIN: Ferritin: 816 ng/mL — ABNORMAL HIGH (ref 22–322)

## 2013-12-02 LAB — POCT GLYCOSYLATED HEMOGLOBIN (HGB A1C): HEMOGLOBIN A1C: 9.9

## 2013-12-02 NOTE — Progress Notes (Signed)
   Subjective:    Patient ID: Jonathon Bailey, male    DOB: 11-04-1955, 58 y.o.   MRN: [id number]  HPI 58 year old male presents for follow up today. Patient is in need of several things today (preventative care) as he is undergoing evaluation for renal transplant in the near future.   1) HTN Disease Monitoring: Home BP Monitoring - No  Medications:Coreg 25 BID Compliance-  Yes  ROS: Lightheadedness-  No, Edema- Minimal. Chest pain- No    Dyspnea- Yes; he reports he always has some SOB  2) Diabetes mellitus, type 2 Disease Monitoring  Blood Sugar Ranges: 160 - 225  Visual problems: yes; followed by Ophthalmology  Medication Compliance: Endorses compliance with Lantus (75 units BID) Medication Side Effects  Hypoglycemia: no   Preventitive Health Care  Foot Exam: To be performed today.  3) ESRD - Dialysis T, Th, Sat; Tolerating dialysis well. - Follows closely with Renal  4) CHF, chronic combined - Patient reports he is doing well - No worsening edema or SOB - No PND, Orthopnea - Compliant with Coreg.  No diuretics as he is on HD  5) Preventative Health care - Patient in need of Influenza, Tetanus and Pneumovax - Patient also in need of colonoscopy  Social history reviewed: History   Social History  . Marital Status: Divorced    Spouse Name: N/A    Number of Children: N/A  . Years of Education: N/A   Social History Main Topics  . Smoking status: Former Smoker -- 1.00 packs/day for 15 years    Types: Cigarettes    Quit date: 09/24/1986  . Smokeless tobacco: Never Used  . Alcohol Use: 1.2 oz/week    2 Shots of liquor per week  . Drug Use: No  . Sexual Activity: Not Currently   Other Topics Concern  . None   Social History Narrative  . None     Review of Systems Per HPI    Objective:   Physical Exam Filed Vitals:   12/02/13 0857  BP: 151/87  Pulse: 82  Temp: 98.7 F (37.1 C)   Exam: General: chronically ill appearing obese gentleman in  NAD Cardiovascular: RRR. No murmurs, rubs, or gallops. Respiratory: Bibasilar rales.  Abdomen: obese, soft, nontender, nondistended. Extremities: Trace LE edema.   Diabetic Foot Check -  Appearance - no lesions, ulcers or calluses Skin - no unusual pallor or redness Monofilament testing -  Right - Great toe, medial, central, lateral ball and posterior foot absent Left - Great toe, medial, central, lateral ball and posterior foot absent     Assessment & Plan:  See Problem List

## 2013-12-02 NOTE — Assessment & Plan Note (Signed)
Doing well.  No evidence of exacerbation at this time.

## 2013-12-02 NOTE — Assessment & Plan Note (Signed)
Uncontrolled. A1C 9.9 today. Difficult to control secondary to non-compliance.  Patient is in need of bolus insulin but refuses. Recommended that patient see Dr. Valentina Lucks for further titration/managment.

## 2013-12-02 NOTE — Assessment & Plan Note (Signed)
Influenza and Pneumovax given today.  Referral to GI for colonoscopy placed.

## 2013-12-02 NOTE — Assessment & Plan Note (Signed)
Nearly at goal today.  He has been well controlled on Coreg. Will continue.

## 2013-12-02 NOTE — Assessment & Plan Note (Signed)
Doing well on HD ?

## 2013-12-02 NOTE — Patient Instructions (Signed)
It was nice to see you today.  We gave you the immunizations today (Pneumonia, Flu, and Tdap).  I have ordered a referral regarding your colonoscopy.  I would like you to see Dr.Koval (our pharmacist) to aid in management of your diabetes.  Please set up an appointment to see him.  Continue all of your other medications.

## 2013-12-07 ENCOUNTER — Other Ambulatory Visit: Payer: Self-pay | Admitting: *Deleted

## 2013-12-07 MED ORDER — INSULIN DETEMIR 100 UNIT/ML ~~LOC~~ SOLN: 75.0000 [IU] | Freq: Two times a day (BID) | SUBCUTANEOUS | Status: DC

## 2013-12-08 ENCOUNTER — Other Ambulatory Visit: Payer: Self-pay | Admitting: Family Medicine

## 2013-12-08 MED ORDER — INSULIN DETEMIR 100 UNIT/ML FLEXPEN: PEN_INJECTOR | SUBCUTANEOUS | Status: DC

## 2013-12-10 ENCOUNTER — Other Ambulatory Visit: Payer: Self-pay | Admitting: *Deleted

## 2013-12-23 ENCOUNTER — Encounter: Payer: Self-pay | Admitting: Internal Medicine

## 2014-02-01 ENCOUNTER — Ambulatory Visit (INDEPENDENT_AMBULATORY_CARE_PROVIDER_SITE_OTHER): Payer: Medicare Other | Admitting: Surgery

## 2014-02-01 ENCOUNTER — Encounter (INDEPENDENT_AMBULATORY_CARE_PROVIDER_SITE_OTHER): Payer: Self-pay | Admitting: Surgery

## 2014-02-01 VITALS — BP 152/80 | HR 84 | Temp 97.0°F | Resp 16 | Ht 72.0 in | Wt 294.6 lb

## 2014-02-01 DIAGNOSIS — K432 Incisional hernia without obstruction or gangrene: Secondary | ICD-10-CM

## 2014-02-01 DIAGNOSIS — N186 End stage renal disease: Secondary | ICD-10-CM

## 2014-02-01 DIAGNOSIS — Z992 Dependence on renal dialysis: Principal | ICD-10-CM

## 2014-02-01 DIAGNOSIS — K430 Incisional hernia with obstruction, without gangrene: Secondary | ICD-10-CM | POA: Insufficient documentation

## 2014-02-01 NOTE — Patient Instructions (Signed)
Please consider the recommendations that we have given you today:  Consider surgery to place a peritoneal dialysis catheter.  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  You will need clearance from your cardiologist to determine if it is safe to come off Plavix & have surgery  See the Handout(s) we have given you.  Please call our office at [phone] if you wish to schedule surgery or if you have further questions / concerns.   Peritoneal Dialysis (CAPD) Catheter  Healthy kidneys remove excess water and waste products from the body in the form of urine. When the kidneys cannot do this, serious problems develop and a person can fall into kidney failure.   In most cases, the kidneys can heal and recover to a better place.  Sometimes the kidneys remain somewhat damaged and a kidney specialist (nephrologist) needs to help manage the disease with medications and adjustments in diet.    Sometimes the kidney failure has progressed too far.  The body's waste and fluid build up in the blood. Hands and legs swell. You start to feel more weak or nauseated. Your blood pressure may rise, to seeing you at serious risk for heart attack and stroke.  If not treated, you will eventually die. Dialysis is a treatment that replaces the work that your kidneys would do if they were healthy to help keep you alive.  Dialysis can be done using a machine outside of the body (hemodialysis) connected to your blood; or, it can be done with a tube placed to enter your abdomen (peritoneal dialysis). The peritoneum is the inner lining of your abdominal cavity, covering the inner organs & abdominal wall.  This inner lining of peritoneum works like a filter.   Peritoneal dialysis involves placed several quarts of sterile fluid into the inner abdomen/peritoneal cavity.  The waste products and fluids can exchange across the peritoneal membrane.  The fluid is later removed.  This most poor every  day.  Sometimes the catheter can be attached to a machine that runs at night while you sleep (continuous cycler-assisted dialysis = CCPD).  Sometimes the fluid can be infused in her abdomen and then later removed hours later (continuous ambulatory peritoneal dialysis = CAPD).  If your nephrologist feels like you are a good candidate, you will be sent to be evaluated by a surgeon to consider placement of a peritoneal dialysis catheter.  Your nephrologist should have shown you educational videos .  You should discuss with dialysis nurses about what life with a peritoneal dialysis catheter is like.  Most patients who have not had many abdominal operations are reasonable candidates for placement of a peritoneal dialysis catheter   It is important to understand the risks and benefits of the procedure prior to undergoing surgery.    If your surgeon feels you are a reasonable candidate, you will have a peritoneal dialysis catheter placed.    This requires an operation under general anesthesia.  The surgeon places a soft plastic tube ([place] curl catheter) into your abdomen.  The deeper curled tip rests down in the pelvis.  The middle part is tunneled inside your abdominal wall where cuffs provide a water-tight seal.  This leaves an outer foot of plastic tubing resting outside your body, usually in your lower abdomen below the beltline near your waist.  This is a quick procedure that can often be done as an outpatient with the possibility of staying overnight.  It takes several weeks  for the catheter to heal into a watertight seal.  Initially, your nephrologist we will have the dialysis nurses do gentle flushes through the catheter.  Once the catheter is well sealed in, you will begin larger exchanges of fluid to do full peritoneal dialysis.  Your nephrologist and dialysis nurses will help guide you through this.  While placement of the catheter is usually not technically difficult, there are risks to the  procedure.  The biggest risk is infection.  This is why we place the catheter in the operating room with the use of intravenous antibiotics.  Using sterile technique to hook up the catheter to your dialysis fluids is essential.  Most infections of the catheter are mild and can be treated with antibiotics placed in the vein were placed with the peritoneal fluid.  However, sometimes the infection worsens or persists and the catheter needs to be removed.  Occasionally, the catheters can be blocked due to inner organs plugging up the tubing or kinks.  Sometimes the catheter leaks and needs to be replaced.  Sometimes the inner abdomen has too many adhesions from prior surgeries or infections and there is not enough space for fluid exchanges to occur.  Sometimes, peritoneal dialysis is not enough to compensate for the kidney failure.   In rare instances, the inner lining of the abdomen becomes severely thickened or inflamed and peritoneal dialysis can no longer work.  Sometimes it is not possible to safely replace a new catheter and the patient must go on hemodialysis permanently.  It is very common to need hemodialysis intermittently even if you have a peritoneal dialysis catheter in cases of emergencies or if peritoneal dialysis fails.  Your nephrologist and surgeon can help you decide what the best form of dialysis is for you  Peritoneal Dialysis (CAPD) Catheter  Healthy kidneys remove excess water and waste products from the body in the form of urine. When the kidneys cannot do this, serious problems develop and a person can fall into kidney failure.   In most cases, the kidneys can heal and recover to a better place.  Sometimes the kidneys remain somewhat damaged and a kidney specialist (nephrologist) needs to help manage the disease with medications and adjustments in diet.    Sometimes the kidney failure has progressed too far.  The body's waste and fluid build up in the blood. Hands and legs swell. You  start to feel more weak or nauseated. Your blood pressure may rise, to seeing you at serious risk for heart attack and stroke.  If not treated, you will eventually die. Dialysis is a treatment that replaces the work that your kidneys would do if they were healthy to help keep you alive.  Dialysis can be done using a machine outside of the body (hemodialysis) connected to your blood; or, it can be done with a tube placed to enter your abdomen (peritoneal dialysis). The peritoneum is the inner lining of your abdominal cavity, covering the inner organs & abdominal wall.  This inner lining of peritoneum works like a filter.   Peritoneal dialysis involves placed several quarts of sterile fluid into the inner abdomen/peritoneal cavity.  The waste products and fluids can exchange across the peritoneal membrane.  The fluid is later removed.  This most poor every day.  Sometimes the catheter can be attached to a machine that runs at night while you sleep (continuous cycler-assisted dialysis = CCPD).  Sometimes the fluid can be infused in her abdomen and then later removed hours  later (continuous ambulatory peritoneal dialysis = CAPD).  If your nephrologist feels like you are a good candidate, you will be sent to be evaluated by a surgeon to consider placement of a peritoneal dialysis catheter.  Your nephrologist should have shown you educational videos .  You should discuss with dialysis nurses about what life with a peritoneal dialysis catheter is like.  Most patients who have not had many abdominal operations are reasonable candidates for placement of a peritoneal dialysis catheter   It is important to understand the risks and benefits of the procedure prior to undergoing surgery.    If your surgeon feels you are a reasonable candidate, you will have a peritoneal dialysis catheter placed.    This requires an operation under general anesthesia.  The surgeon places a soft plastic tube ([place] curl catheter) into your  abdomen.  The deeper curled tip rests down in the pelvis.  The middle part is tunneled inside your abdominal wall where cuffs provide a water-tight seal.  This leaves an outer foot of plastic tubing resting outside your body, usually in your lower abdomen below the beltline near your waist.  This is a quick procedure that can often be done as an outpatient with the possibility of staying overnight.  It takes several weeks for the catheter to heal into a watertight seal.  Initially, your nephrologist we will have the dialysis nurses do gentle flushes through the catheter.  Once the catheter is well sealed in, you will begin larger exchanges of fluid to do full peritoneal dialysis.  Your nephrologist and dialysis nurses will help guide you through this.  While placement of the catheter is usually not technically difficult, there are risks to the procedure.  The biggest risk is infection.  This is why we place the catheter in the operating room with the use of intravenous antibiotics.  Using sterile technique to hook up the catheter to your dialysis fluids is essential.  Most infections of the catheter are mild and can be treated with antibiotics placed in the vein were placed with the peritoneal fluid.  However, sometimes the infection worsens or persists and the catheter needs to be removed.  Occasionally, the catheters can be blocked due to inner organs plugging up the tubing or kinks.  Sometimes the catheter leaks and needs to be replaced.  Sometimes the inner abdomen has too many adhesions from prior surgeries or infections and there is not enough space for fluid exchanges to occur.  Sometimes, peritoneal dialysis is not enough to compensate for the kidney failure.   In rare instances, the inner lining of the abdomen becomes severely thickened or inflamed and peritoneal dialysis can no longer work.  Sometimes it is not possible to safely replace a new catheter and the patient must go on hemodialysis  permanently.  It is very common to need hemodialysis intermittently even if you have a peritoneal dialysis catheter in cases of emergencies or if peritoneal dialysis fails.  Your nephrologist and surgeon can help you decide what the best form of dialysis is for you   Hernia A hernia occurs when an internal organ pushes out through a weak spot in the abdominal wall. Hernias most commonly occur in the groin and around the navel. Hernias often can be pushed back into place (reduced). Most hernias tend to get worse over time. Some abdominal hernias can get stuck in the opening (irreducible or incarcerated hernia) and cannot be reduced. An irreducible abdominal hernia which is tightly squeezed into  the opening is at risk for impaired blood supply (strangulated hernia). A strangulated hernia is a medical emergency. Because of the risk for an irreducible or strangulated hernia, surgery may be recommended to repair a hernia. CAUSES   Heavy lifting.  Prolonged coughing.  Straining to have a bowel movement.  A cut (incision) made during an abdominal surgery. HOME CARE INSTRUCTIONS   Bed rest is not required. You may continue your normal activities.  Avoid lifting more than 10 pounds (4.5 kg) or straining.  Cough gently. If you are a smoker it is best to stop. Even the best hernia repair can break down with the continual strain of coughing. Even if you do not have your hernia repaired, a cough will continue to aggravate the problem.  Do not wear anything tight over your hernia. Do not try to keep it in with an outside bandage or truss. These can damage abdominal contents if they are trapped within the hernia sac.  Eat a normal diet.  Avoid constipation. Straining over long periods of time will increase hernia size and encourage breakdown of repairs. If you cannot do this with diet alone, stool softeners may be used. SEEK IMMEDIATE MEDICAL CARE IF:   You have a fever.  You develop increasing  abdominal pain.  You feel nauseous or vomit.  Your hernia is stuck outside the abdomen, looks discolored, feels hard, or is tender.  You have any changes in your bowel habits or in the hernia that are unusual for you.  You have increased pain or swelling around the hernia.  You cannot push the hernia back in place by applying gentle pressure while lying down. MAKE SURE YOU:   Understand these instructions.  Will watch your condition.  Will get help right away if you are not doing well or get worse. Document Released: 09/10/2005 Document Revised: 12/03/2011 Document Reviewed: 04/29/2008 [facility] Patient Information 2014 [place].  HERNIA REPAIR: POST OP INSTRUCTIONS  1. DIET: Follow a light bland diet the first 24 hours after arrival home, such as soup, liquids, crackers, etc.  Be sure to include lots of fluids daily.  Avoid fast food or heavy meals as your are more likely to get nauseated.  Eat a low fat the next few days after surgery. 2. Take your usually prescribed home medications unless otherwise directed. 3. PAIN CONTROL: a. Pain is best controlled by a usual combination of three different methods TOGETHER: i. Ice/Heat ii. Over the counter pain medication iii. Prescription pain medication b. Most patients will experience some swelling and bruising around the hernia(s) such as the bellybutton, groins, or old incisions.  Ice packs or heating pads (30-60 minutes up to 6 times a day) will help. Use ice for the first few days to help decrease swelling and bruising, then switch to heat to help relax tight/sore spots and speed recovery.  Some people prefer to use ice alone, heat alone, alternating between ice & heat.  Experiment to what works for you.  Swelling and bruising can take several weeks to resolve.   c. It is helpful to take an over-the-counter pain medication regularly for the first few weeks.  Choose one of the following that works best for you: i. Naproxen (Aleve,  etc)  Two 220mg  tabs twice a day ii. Ibuprofen (Advil, etc) Three 200mg  tabs four times a day (every meal & bedtime) iii. Acetaminophen (Tylenol, etc) 325-650mg  four times a day (every meal & bedtime) d. A  prescription for pain medication should be  given to you upon discharge.  Take your pain medication as prescribed.  i. If you are having problems/concerns with the prescription medicine (does not control pain, nausea, vomiting, rash, itching, etc), please call us [phone] to see if we need to switch you to a different pain medicine that will work better for you and/or control your side effect better. ii. If you need a refill on your pain medication, please contact your pharmacy.  They will contact our office to request authorization. Prescriptions will not be filled after 5 pm or on week-ends. 4. Avoid getting constipated.  Between the surgery and the pain medications, it is common to experience some constipation.  Increasing fluid intake and taking a fiber supplement (such as Metamucil, Citrucel, FiberCon, MiraLax, etc) 1-2 times a day regularly will usually help prevent this problem from occurring.  A mild laxative (prune juice, Milk of Magnesia, MiraLax, etc) should be taken according to package directions if there are no bowel movements after 48 hours.   5. Wash / shower every day.  You may shower over the dressings as they are waterproof.   6. Remove your waterproof bandages 5 days after surgery.  You may leave the incision open to air.  You may replace a dressing/Band-Aid to cover the incision for comfort if you wish.  Continue to shower over incision(s) after the dressing is off.    7. ACTIVITIES as tolerated:   a. You may resume regular (light) daily activities beginning the next day-such as daily self-care, walking, climbing stairs-gradually increasing activities as tolerated.  If you can walk 30 minutes without difficulty, it is safe to try more intense activity such as jogging,  treadmill, bicycling, low-impact aerobics, swimming, etc. b. Save the most intensive and strenuous activity for last such as sit-ups, heavy lifting, contact sports, etc  Refrain from any heavy lifting or straining until you are off narcotics for pain control.   c. DO NOT PUSH THROUGH PAIN.  Let pain be your guide: If it hurts to do something, don't do it.  Pain is your body warning you to avoid that activity for another week until the pain goes down. d. You may drive when you are no longer taking prescription pain medication, you can comfortably wear a seatbelt, and you can safely maneuver your car and apply brakes. e. Dennis Bast may have sexual intercourse when it is comfortable.  8. FOLLOW UP in our office a. Please call [facility] at [phone] to set up an appointment to see your surgeon in the office for a follow-up appointment approximately 2-3 weeks after your surgery. b. Make sure that you call for this appointment the day you arrive home to insure a convenient appointment time. 9.  IF YOU HAVE DISABILITY OR FAMILY LEAVE FORMS, BRING THEM TO THE OFFICE FOR PROCESSING.  DO NOT GIVE THEM TO YOUR DOCTOR.  WHEN TO CALL us [phone]: 1. Poor pain control 2. Reactions / problems with new medications (rash/itching, nausea, etc)  3. Fever over 101.5 F (38.5 C) 4. Inability to urinate 5. Nausea and/or vomiting 6. Worsening swelling or bruising 7. Continued bleeding from incision. 8. Increased pain, redness, or drainage from the incision   The clinic staff is available to answer your questions during regular business hours (8:30am-5pm).  Please don't hesitate to call and ask to speak to one of our nurses for clinical concerns.   If you have a medical emergency, go to the nearest emergency room or call 911.  A surgeon  from [facility] is always on call at the hospitals in [facility], [place], [place], [place], [place]  [address] ?  [address]  [address], [place], [place]   [address] MAIN: [phone] ? TOLL FREE: [phone] ? FAX: [phone] [email or web address]

## 2014-02-01 NOTE — Progress Notes (Signed)
Subjective:     Patient ID: Jonathon Bailey, male   DOB: 10-23-1955, 58 y.o.   MRN: [id number]  HPI  Note: This dictation was prepared with Dragon/digital dictation along with [employer]. Any transcriptional errors that result from this process are unintentional.       Jonathon Bailey  02/11/1956 [id number]  Patient Care Team: [name], DO as PCP - General (Pediatrics) [name], MD as Consulting Physician (Nephrology) [name], MD as Consulting Physician (Nephrology)  This patient is a 58 y.o.male who presents today for surgical evaluation at the request of Dr. Marval Regal.   Reason for visit: Dialysis-dependent.  Consider for peritoneal dialysis catheter placement  Pleasant morbidly obese male on Plavix for coronary artery disease.  History of chronic heart failure.  Recently est. With [facility] for his cardiac care.  He gets hemodialysis Tuesday Thursday Saturday.  Gets it through a dialysis catheter in his right chest.  Had a left AV fistula that apparently has not adequately matured.  He prefers to have Hickman catheters.  He was wondering about the possibility of using peritoneal dialysis since he has a friend who is starting that.  Surgical consultation recommended.  He had an umbilical hernia repaired in 1990s by Dr. Bubba Camp.  He developed a recurrence.  He was considering surgery in 2009 with Dr Hulen Skains.  Cardiac clearance was recommended.  Surgery did not happen.  Patient walks with some assistance.  Claims to walk 2 blocks without much difficulty.  He no longer smokes.  No history of infections.  Daily bowel movements without any history of bowel obstructions.  Patient Active Problem List   Diagnosis Date Noted  . Morbid obesity 02/01/2014  . Recurrent ventral incisional hernia 02/01/2014  . Preventative health care 12/02/2013  . Other complications due to renal dialysis device, implant, and graft 10/24/2012  . COPD (chronic obstructive  pulmonary disease) 10/23/2012  . DIABETIC PERIPHERAL NEUROPATHY 01/09/2010  . ESRD (end stage renal disease) on dialysis 08/30/2009  . CEREBROVASCULAR ACCIDENT, HX OF 08/30/2009  . Diabetes mellitus type 2 with complications Q000111Q  . HYPERLIPIDEMIA 09/21/2008  . OBSTRUCTIVE SLEEP APNEA 09/21/2008  . ESSENTIAL HYPERTENSION 09/21/2008  . CORONARY ARTERY DISEASE 09/21/2008  . Chronic combined systolic and diastolic heart failure Q000111Q    Past Medical History  Diagnosis Date  . Diabetes mellitus   . Hyperlipidemia   . Hypertension   . Myocardial infarction     status post MI x2 and 3 stents placed in 2003  . Stroke ~ 2007; ~1987    "weak on right side; messed w/right side of brain; cry all the time"  . Sleep apnea     "sleep w/CPAP sometimes"  . Arthritis   . Anxiety   . Depression   . Chronic systolic heart failure Q000111Q    ECHO Feb 2013 showed LVEF low normal at 50-55%, +hypokinetic anterolateral wall and inferolateral wall.    . CKD (chronic kidney disease) stage 4, GFR 15-29 ml/min 08/30/2009    Progressive renal failure since 2008, creatinine 1.2 in 2008 up to 3.5 in 2012 and 3.2-5.0 in 2013. All UA's 2011-13 showed >300 protein on dipstick. Work-up in May 2011 showed negative Urine IFE and SPEP, ultrasound showed 12-13 cm kidneys with increased echogenicity and UPC ratio was 1.5 gm proteinuria.  Hgb A1C's from 2011 to 2013 were all between 9-11.  Patient saw Dr. Donnetta Hutching (vasc surgery) for HD access in Aug 2013 > vein mapping was done and  Dr. Donnetta Hutching felt the left arm (pt is R handed) was suitable for L arm Cimino radiocephalic fistula. Patient said he wasn't ready to consider doing dialysis and declined the surgery.       Past Surgical History  Procedure Laterality Date  . Coronary stent placement    . Umbilical hernia repair      umbilical  . Coronary angioplasty with stent placement  ~ 2002    "3"  . Av fistula placement  10/17/2012    Procedure: ARTERIOVENOUS  (AV) FISTULA CREATION;  Surgeon: Mal Misty, MD;  Location: [place];  Service: Vascular;  Laterality: Left;    History   Social History  . Marital Status: Divorced    Spouse Name: N/A    Number of Children: N/A  . Years of Education: N/A   Occupational History  . Not on file.   Social History Main Topics  . Smoking status: Former Smoker -- 1.00 packs/day for 15 years    Types: Cigarettes    Quit date: 09/24/1986  . Smokeless tobacco: Never Used  . Alcohol Use: 1.2 oz/week    2 Shots of liquor per week  . Drug Use: No  . Sexual Activity: Not Currently   Other Topics Concern  . Not on file   Social History Narrative  . No narrative on file    Family History  Problem Relation Age of Onset  . Asthma Mother   . Hyperlipidemia Mother   . Hypertension Mother   . Stroke Father   . Heart attack Father   . Prostate cancer Father   . Deep vein thrombosis Father   . Cancer Father   . Diabetes Father   . Hyperlipidemia Father   . Hypertension Father   . Other Father     varicose veins  . Heart disease Father     before age 57  . Other Sister     varicose veins    Current Outpatient Prescriptions  Medication Sig Dispense Refill  . albuterol (PROVENTIL HFA;VENTOLIN HFA) 108 (90 BASE) MCG/ACT inhaler Inhale 2 puffs into the lungs every 6 (six) hours as needed for wheezing. For wheezing      . calcitRIOL (ROCALTROL) 0.25 MCG capsule Take 0.25-0.5 mcg by mouth daily. Alternates takes 1 tablet and 2 capsules the next day      . carvedilol (COREG) 25 MG tablet TAKE ONE & ONE-HALF TABLETS BY MOUTH TWICE DAILY  90 tablet  0  . clopidogrel (PLAVIX) 75 MG tablet Take 75 mg by mouth daily.      . ferrous sulfate 325 (65 FE) MG tablet Take 325 mg by mouth daily.      . Insulin Detemir (LEVEMIR) 100 UNIT/ML Pen 75 units twice daily.  15 mL  11  . Insulin Pen Needle (B-D ULTRAFINE III SHORT PEN) 31G X 8 MM MISC Use with insulin administration.  100 each  0  . rosuvastatin (CRESTOR)  40 MG tablet Take 1 tablet (40 mg total) by mouth daily.  90 tablet  3  . tiotropium (SPIRIVA) 18 MCG inhalation capsule Place 18 mcg into inhaler and inhale daily.      . isosorbide mononitrate (IMDUR) 60 MG 24 hr tablet Take 60 mg by mouth daily.      . sevelamer carbonate (RENVELA) 800 MG tablet Take 1,600 mg by mouth 3 (three) times daily with meals.       No current facility-administered medications for this visit.     Allergies  Allergen Reactions  . Feraheme [Ferumoxytol] Itching    Severe itching.    BP 152/80  Pulse 84  Temp(Src) 97 F (36.1 C) (Temporal)  Resp 16  Ht 6' (1.829 m)  Wt 294 lb 9.6 oz (133.63 kg)  BMI 39.95 kg/m2  No results found.   Review of Systems  Constitutional: Negative for fever, chills and diaphoresis.  HENT: Negative for ear discharge, facial swelling, mouth sores, nosebleeds, sore throat and trouble swallowing.   Eyes: Negative for photophobia, discharge and visual disturbance.  Respiratory: Negative for choking, chest tightness, shortness of breath and stridor.   Cardiovascular: Negative for chest pain and palpitations.  Gastrointestinal: Negative for nausea, vomiting, abdominal pain, diarrhea, constipation, blood in stool, abdominal distention, anal bleeding and rectal pain.  Endocrine: Negative for cold intolerance and heat intolerance.  Genitourinary: Positive for decreased urine volume. Negative for dysuria, urgency, frequency, difficulty urinating and testicular pain.  Musculoskeletal: Negative for arthralgias, back pain, gait problem and myalgias.  Skin: Negative for color change, pallor, rash and wound.  Allergic/Immunologic: Negative for environmental allergies and food allergies.  Neurological: Negative for dizziness, speech difficulty, weakness, numbness and headaches.  Hematological: Negative for adenopathy. Does not bruise/bleed easily.  Psychiatric/Behavioral: Negative for hallucinations, confusion and agitation.         Objective:   Physical Exam  Constitutional: He is oriented to person, place, and time. He appears well-developed and well-nourished. No distress.  HENT:  Head: Normocephalic.  Mouth/Throat: Oropharynx is clear and moist. No oropharyngeal exudate.  Eyes: Conjunctivae and EOM are normal. Pupils are equal, round, and reactive to light. No scleral icterus.  Neck: Normal range of motion. Neck supple. No tracheal deviation present.  Cardiovascular: Normal rate, regular rhythm and intact distal pulses.   Pulmonary/Chest: Effort normal and breath sounds normal. No respiratory distress.  Abdominal: Soft. He exhibits no distension. There is no tenderness. A hernia is present. Hernia confirmed positive in the ventral area. Hernia confirmed negative in the right inguinal area and confirmed negative in the left inguinal area.    Musculoskeletal: Normal range of motion. He exhibits no tenderness.  Lymphadenopathy:    He has no cervical adenopathy.       Right: No inguinal adenopathy present.       Left: No inguinal adenopathy present.  Neurological: He is alert and oriented to person, place, and time. No cranial nerve deficit. He exhibits normal muscle tone. Coordination normal.  Skin: Skin is warm and dry. No rash noted. He is not diaphoretic. No erythema. No pallor.  Psychiatric: He has a normal mood and affect. His behavior is normal. Judgment and thought content normal.       Assessment:     Dialysis-dependent renal failure patient wishing to consider peritoneal dialysis.  Somewhat reasonable candidate.  Persistent ventral hernia recurrent in the setting of diastases recti     Plan:     He requires cardiac clearance.  He is anticoagulated on Plavix.  He been recommended to get cardiac clearance and 2009 when he was considered a repair at that time.  I do not think that happened.   Is reasonable to attempt peritoneal dialysis catheter placement if he gets appropriate training and is cleared  by his cardiologist.  If he gets surgery, he would need this hernia repaired at the same time.  It would need a larger sheet of mesh Given the recurrence in his morbid obesity.Most likely require at least overnight stay given a hernia repair at the same time.I  discussed with the patient and his significant other:  The anatomy & physiology of peritoneum was discussed.  Natural history risks without surgery of worsening renal failure was discussed.   I feel the risks of no intervention will lead to serious problems that outweigh the operative risks; therefore, I recommended placement of a peritoneal dialysis catheter.  I explained laparoscopic techniques with possible need for an open approach.    Risks such as bleeding, infection, abscess, injury to other organs, catheter occlusion or malpositioning, reoperation to remove/reposition the catheter, heart attack, death, and other risks were discussed.   I noted a good likelihood this will help address the problem.  Possibility that this will not be enough to compensate for the renal failure & need for further treatment such as hemodialysis was explained.  Goals of post-operative recovery were discussed as well.  We will work to minimize complications.   The patient is/will be getting training on catheter use by dialysis nursing before and after surgery.  I stressed the importance of meticulous care & sterile technique to prevent catheter problems.  Questions were answered.  The patient expresses understanding & wishes to proceed with surgery.  The anatomy & physiology of the abdominal wall was discussed.  The pathophysiology of hernias was discussed.  Natural history risks without surgery including progeressive enlargement, pain, incarceration & strangulation was discussed.   Contributors to complications such as smoking, obesity, diabetes, prior surgery, etc were discussed.   I feel the risks of no intervention will lead to serious problems that outweigh the  operative risks; therefore, I recommended surgery to reduce and repair the hernia.  I explained laparoscopic techniques with possible need for an open approach.  I noted the probable use of mesh to patch and/or buttress the hernia repair  Risks such as bleeding, infection, abscess, need for further treatment, heart attack, death, and other risks were discussed.  I noted a good likelihood this will help address the problem.   Goals of post-operative recovery were discussed as well.  Possibility that this will not correct all symptoms was explained.  I stressed the importance of low-impact activity, aggressive pain control, avoiding constipation, & not pushing through pain to minimize risk of post-operative chronic pain or injury. Possibility of reherniation especially with smoking, obesity, diabetes, immunosuppression, and other health conditions was discussed.  We will work to minimize complications.     An educational handout further explaining the pathology & treatment options was given as well.  Questions were answered.  The patient expresses understanding & wishes to proceed with surgery.

## 2014-02-02 ENCOUNTER — Encounter (INDEPENDENT_AMBULATORY_CARE_PROVIDER_SITE_OTHER): Payer: Self-pay

## 2014-02-02 ENCOUNTER — Telehealth (INDEPENDENT_AMBULATORY_CARE_PROVIDER_SITE_OTHER): Payer: Self-pay

## 2014-02-02 NOTE — Telephone Encounter (Signed)
Called to get fx# in order to fax the cardiac clearance request to Mr Junie Spencer. That saw the pt last month. I requested a call back if the pt was going to need additional testing but I was told they would fax me the information. I faxed the clearance to fx#[phone].

## 2014-02-03 DIAGNOSIS — I2510 Atherosclerotic heart disease of native coronary artery without angina pectoris: Secondary | ICD-10-CM | POA: Insufficient documentation

## 2014-02-03 DIAGNOSIS — F1491 Cocaine use, unspecified, in remission: Secondary | ICD-10-CM | POA: Insufficient documentation

## 2014-02-03 DIAGNOSIS — Z8679 Personal history of other diseases of the circulatory system: Secondary | ICD-10-CM | POA: Insufficient documentation

## 2014-02-03 DIAGNOSIS — Z955 Presence of coronary angioplasty implant and graft: Secondary | ICD-10-CM | POA: Insufficient documentation

## 2014-02-10 ENCOUNTER — Encounter: Payer: Self-pay | Admitting: Internal Medicine

## 2014-02-12 ENCOUNTER — Ambulatory Visit (INDEPENDENT_AMBULATORY_CARE_PROVIDER_SITE_OTHER): Payer: Medicare Other | Admitting: Internal Medicine

## 2014-02-12 ENCOUNTER — Encounter: Payer: Self-pay | Admitting: Internal Medicine

## 2014-02-12 VITALS — BP 132/84 | HR 78 | Ht 70.0 in | Wt 288.0 lb

## 2014-02-12 DIAGNOSIS — N186 End stage renal disease: Secondary | ICD-10-CM

## 2014-02-12 DIAGNOSIS — Z7902 Long term (current) use of antithrombotics/antiplatelets: Secondary | ICD-10-CM

## 2014-02-12 DIAGNOSIS — Z1211 Encounter for screening for malignant neoplasm of colon: Secondary | ICD-10-CM

## 2014-02-12 DIAGNOSIS — K5909 Other constipation: Secondary | ICD-10-CM

## 2014-02-12 DIAGNOSIS — K5900 Constipation, unspecified: Secondary | ICD-10-CM

## 2014-02-12 MED ORDER — POLYETHYLENE GLYCOL 3350 17 G PO PACK: 17.0000 g | PACK | Freq: Every day | ORAL | Status: DC

## 2014-02-12 NOTE — Progress Notes (Addendum)
Patient ID: Jonathon Bailey, male   DOB: 11-18-1955, 58 y.o.   MRN: [id number] HPI: Jonathon Bailey is a 58 year old male with a past no history of end-stage renal disease currently on hemodialysis 3 days per week being worked up for possible initiation of peritoneal dialysis, CAD on Plavix, CHF, hypertension, hyperlipidemia, diabetes, and sleep apnea who is seen in consultation at the request of Dr. Lacinda Axon to consider repeat screening colonoscopy. He is here today with his wife. He denies specific complaint other than chronic constipation. He tried MiraLax but never more than one or 2 days. He is using Dulcolax one day per week. This does induce bowel movement but can cause abdominal cramping. He reports hard infrequent stools. No blood in his stool or melena. No other abdominal pain. No nausea or vomiting. Good appetite. No heartburn. No trouble swallowing. No hepatobiliary complaints. No family history of colon cancer. He was previously using magnesium citrate but stopped due to kidney dysfunction. He does use daily Plavix.  He reports having a colonoscopy at age 35 performed by Dr. Collene Mares. He recalls this as being normal. He denies a history of polyps. He denies a family history of polyps as well.  He reports that he is being evaluated for kidney transplantation at [facility]. He is under the impression that he may require repeat colonoscopy before he can be listed for transplant  Past Medical History  Diagnosis Date  . Diabetes mellitus   . Hyperlipidemia   . Hypertension   . Myocardial infarction     status post MI x2 and 3 stents placed in 2003  . Stroke ~ 2007; ~1987    "weak on right side; messed w/right side of brain; cry all the time"  . Sleep apnea     "sleep w/CPAP sometimes"  . Arthritis   . Anxiety   . Depression   . Chronic systolic heart failure Q000111Q    ECHO Feb 2013 showed LVEF low normal at 50-55%, +hypokinetic anterolateral wall and inferolateral  wall.    . CKD (chronic kidney disease) stage 4, GFR 15-29 ml/min 08/30/2009    Progressive renal failure since 2008, creatinine 1.2 in 2008 up to 3.5 in 2012 and 3.2-5.0 in 2013. All UA's 2011-13 showed >300 protein on dipstick. Work-up in May 2011 showed negative Urine IFE and SPEP, ultrasound showed 12-13 cm kidneys with increased echogenicity and UPC ratio was 1.5 gm proteinuria.  Hgb A1C's from 2011 to 2013 were all between 9-11.  Patient saw Dr. Donnetta Hutching (vasc surgery) for HD access in Aug 2013 > vein mapping was done and Dr. Donnetta Hutching felt the left arm (pt is R handed) was suitable for L arm Cimino radiocephalic fistula. Patient said he wasn't ready to consider doing dialysis and declined the surgery.       Past Surgical History  Procedure Laterality Date  . Coronary stent placement    . Umbilical hernia repair      umbilical  . Coronary angioplasty with stent placement  ~ 2002    "3"  . Av fistula placement  10/17/2012    Procedure: ARTERIOVENOUS (AV) FISTULA CREATION;  Surgeon: Mal Misty, MD;  Location: [facility] OR;  Service: Vascular;  Laterality: Left;    Current Outpatient Prescriptions  Medication Sig Dispense Refill  . albuterol (PROVENTIL HFA;VENTOLIN HFA) 108 (90 BASE) MCG/ACT inhaler Inhale 2 puffs into the lungs every 6 (six) hours as needed for wheezing. For wheezing      . calcitRIOL (ROCALTROL)  0.25 MCG capsule Take 0.25-0.5 mcg by mouth daily. Alternates takes 1 tablet and 2 capsules the next day      . carvedilol (COREG) 25 MG tablet TAKE ONE & ONE-HALF TABLETS BY MOUTH TWICE DAILY  90 tablet  0  . clopidogrel (PLAVIX) 75 MG tablet Take 75 mg by mouth daily.      . Insulin Detemir (LEVEMIR) 100 UNIT/ML Pen 75 units twice daily.  15 mL  11  . Insulin Pen Needle (B-D ULTRAFINE III SHORT PEN) 31G X 8 MM MISC Use with insulin administration.  100 each  0  . isosorbide mononitrate (IMDUR) 60 MG 24 hr tablet Take 60 mg by mouth daily.      . rosuvastatin (CRESTOR) 40 MG tablet Take 1  tablet (40 mg total) by mouth daily.  90 tablet  3  . sevelamer carbonate (RENVELA) 800 MG tablet Take 1,600 mg by mouth 3 (three) times daily with meals.      . tiotropium (SPIRIVA) 18 MCG inhalation capsule Place 18 mcg into inhaler and inhale daily.      . polyethylene glycol (MIRALAX / GLYCOLAX) packet Take 17 g by mouth daily.  30 packet  3   No current facility-administered medications for this visit.    Allergies  Allergen Reactions  . Feraheme [Ferumoxytol] Itching    Severe itching.    Family History  Problem Relation Age of Onset  . Asthma Mother   . Hyperlipidemia Mother   . Hypertension Mother   . Stroke Father   . Heart attack Father   . Prostate cancer Father   . Deep vein thrombosis Father   . Cancer Father   . Diabetes Father   . Hyperlipidemia Father   . Hypertension Father   . Other Father     varicose veins  . Heart disease Father     before age 55  . Other Sister     varicose veins    History  Substance Use Topics  . Smoking status: Former Smoker -- 1.00 packs/day for 15 years    Types: Cigarettes    Quit date: 09/24/1986  . Smokeless tobacco: Never Used  . Alcohol Use: 1.2 oz/week    2 Shots of liquor per week    ROS: As per history of present illness, otherwise negative  BP 132/84  Pulse 78  Ht 5\' 10"  (1.778 m)  Wt 288 lb (130.636 kg)  BMI 41.32 kg/m2 Constitutional: Well-developed and well-nourished. No distress. HEENT: Normocephalic and atraumatic. Oropharynx is clear and moist. No oropharyngeal exudate. Conjunctivae are normal.  No scleral icterus. Neck: Neck supple. Trachea midline. Cardiovascular: Normal rate, regular rhythm and intact distal pulses. No M/R/G, tunneled catheter in the right upper chest Pulmonary/chest: Effort normal and breath sounds normal. No wheezing, rales or rhonchi. Abdominal: Soft, obese, nontender, nondistended. Bowel sounds active throughout.  Extremities: no clubbing, cyanosis, or edema Neurological:  Alert and oriented to person place and time. Skin: Skin is warm and dry. No rashes noted. Psychiatric: Normal mood and affect. Behavior is normal.   ASSESSMENT/PLAN: 58 year old male with a past no history of end-stage renal disease currently on hemodialysis 3 days per week being worked up for possible initiation of peritoneal dialysis, CAD on Plavix, CHF, hypertension, hyperlipidemia, diabetes, and sleep apnea who is seen in consultation at the request of Dr. Lacinda Axon to consider repeat screening colonoscopy.   1.  CRC screening -- he is average risk from a colorectal cancer screening standpoint. He had a colonoscopy  6 years ago which he reports was normal. If this is the case he would not be due for colorectal cancer screening again until age 27. That being said, if colonoscopy needs to be repeated to satisfy his kidney transplant requirement, I will be happy to repeat the test at this time. We have requested his previous colonoscopy record for Dr. Collene Mares to ensure his recollection that it was normal is correct.  If not, or if he had a small adenoma, he may be due for colonoscopy now. Plavix will need to be held for any repeat procedure and we would need to seek permission from his cardiologist before holding the medication. I asked that he call me with his kidney transplant doctors name and phone number, and he says he will do so. Our plan will be to contact that office to see if they are requesting repeat colonoscopy at this time.  2.  Chronic constipation -- he did not give MiraLax an adequate trial and I recommended he take 17 g daily for at least a week to see if this will benefit him. If his stools are too loose he can take it every other day but he now understands he will need this on a scheduled basis to provide benefit. He will likely be a little discontinue Dulcolax.  Addendum: Colonoscopy received from Dr. Collene Mares dated 12/19/2007 --Normal screening colonoscopy to the cecum, Repeat screening  colonoscopy recommended March 2019

## 2014-02-12 NOTE — Patient Instructions (Signed)
Please call us back with your kidney transplant doctors name and number.  You have been given a prescription for Miralax take 17 g daily.

## 2014-02-17 ENCOUNTER — Telehealth (INDEPENDENT_AMBULATORY_CARE_PROVIDER_SITE_OTHER): Payer: Self-pay

## 2014-02-17 NOTE — Telephone Encounter (Signed)
Dr Meredeth Ide, PA was calling to let Dr Johney Maine know that patient can come off of his plavix for his procedure. Dr Vinnie Level states that if Dr Johney Maine needed to call him back/page him, he was more than welcome to at [phone]. Informed Dr Vinnie Level that I would send the message to Dr Johney Maine and his nurse. He verbalized understanding.

## 2014-02-18 NOTE — Telephone Encounter (Signed)
LM with Yvone Neu to give Lanny Cramp a message that I need written clearance to be faxed to [place] here at [place]. I know he gave a verbal for the pt to be cleared for surgery and to hold the Plavix 5 days before surgery though anesthesia will require this in writing to go in pt's chart. I tried paging Lanny Cramp, P.A. But did not receive a call back. I gave my name,phone# and fax# for the note to get sent to [place] on the pt.

## 2014-02-19 ENCOUNTER — Other Ambulatory Visit (INDEPENDENT_AMBULATORY_CARE_PROVIDER_SITE_OTHER): Payer: Self-pay | Admitting: Surgery

## 2014-02-19 NOTE — Telephone Encounter (Signed)
Orders placed.  Awaiting final letter with clearance from Dr. Vinnie Level.

## 2014-02-24 NOTE — Telephone Encounter (Signed)
Pam returned my call. I explained to her what I was needing from Olegario Messier in writing for clearance even though we did receive a verbal. I advised that I would refax our cardiac clearance request form to [phone]#[phone].

## 2014-02-24 NOTE — Telephone Encounter (Signed)
[facility] requesting written cardiac clearance from Lanny Cramp, [place] in order for [place] to schedule the pt's surgery for PD cath and ventral hernia repair i have to have the clearance to go in pt's chart. I know Meredeth Ide called last week and gave a verbal clearance on the phone with ok for pt to hold Plavix 5 days before surgery. I asked Dr Johney Maine if this was ok but he would like it in writing faxed to [place]. I gave the fx# with attn: to me.

## 2014-02-24 NOTE — Telephone Encounter (Signed)
LMOM for pt to call me b/c I want to explain to him what is taking so long on the clearance. I received a verbal clearance last week by Lanny Cramp, PA at the [facility] hospital but I need it in writing. I can't get anyone to send me a written note stating that pt is cleared and ok to Hold Plavix 5 days before sx. I have called several times asking for this to be faxed and today I get an office note of Lanny Cramp, [place] sent to me dated 02/17/14 pt's last ov but nothing in the note states pt is cleared for sx or ok holding the Plavix. I am going to call them again requesting written clearance. I even faxed the clearance request again today and said Lanny Cramp could just mark on our sheet that pt is cleared with instructions on the Plavix but no I didn't get this sheet back.

## 2014-02-26 NOTE — Telephone Encounter (Signed)
Called pt to notify him that we did receive written cardiology clearance finally from the [place]. I advised pt that once he gets his surgery date that he will need to HOLD the Plavix 5 days before surgery. I am turning his surgical orders into scheduling today and they will contact him on the phone next week. The pt understands.

## 2014-02-26 NOTE — Telephone Encounter (Signed)
LMOM at [facility] office explaining that I still do not have written clearance in order to get pt scheduled for surgery. I explained in detail again what I am needing from their office to be faxed attn: Kaydance Bowie. I explained that we received a verbal for cardiac clearance along w/instructions of holding the Plavix 5 days before surgery but I need that in writing which could be put on our standard cardiac clearance request that I have faxed twice to fx [phone]. I asked for the clearance again in writing to be faxed. I also said the pt was not happy and would be calling himself.

## 2014-02-26 NOTE — Telephone Encounter (Signed)
Called pt to let him know that I am having a hard time trying to get in writing the clearance for his surgery. I advised pt that we did receive a verbal from [facility] on 02/17/14 but I have tried getting in writing for about a week now. I have paged Lanny Cramp, PA with no call back and I have called the [place] requesting the clearance to be wrote out on the form that I have faxed to them with ok for surgery/ ok to hold Plavix for 5 days. I received an office note from Lanny Cramp on 02/24/14 the last office visit note dated 02/17/14 but no where in the note does it state that pt is cleared for sx. I advised pt that I would call them again today but I wanted pt to be aware that it's not [place] having the issues. The pt is aware and will call him self today when he gets out of dialysis. I advised pt that I would call him back later this pm.

## 2014-03-01 ENCOUNTER — Encounter (INDEPENDENT_AMBULATORY_CARE_PROVIDER_SITE_OTHER): Payer: Self-pay

## 2014-03-11 ENCOUNTER — Telehealth (INDEPENDENT_AMBULATORY_CARE_PROVIDER_SITE_OTHER): Payer: Self-pay | Admitting: General Surgery

## 2014-03-11 NOTE — Telephone Encounter (Signed)
Spoke with Ramiro Harvest to inform her that the surgery has been scheduled for this patient on 03/15/14 at [facility] and is estimated to be performed from 7:30-10:00.  She verbalized understanding of this information.

## 2014-03-12 ENCOUNTER — Encounter (HOSPITAL_COMMUNITY): Payer: Self-pay | Admitting: *Deleted

## 2014-03-14 MED ORDER — DEXTROSE 5 % IV SOLN
3.0000 g | INTRAVENOUS | Status: AC
  Administered 2014-03-15: 3 g via INTRAVENOUS
  Filled 2014-03-14: qty 3000

## 2014-03-15 ENCOUNTER — Encounter (HOSPITAL_COMMUNITY)
Admission: RE | Disposition: A | Discharge status: Home Health | Payer: Self-pay | Source: Ambulatory Visit | Attending: Surgery

## 2014-03-15 ENCOUNTER — Observation Stay (HOSPITAL_COMMUNITY)
Admission: RE | Admit: 2014-03-15 | Discharge: 2014-03-19 | Disposition: A | Discharge status: Home Health | Payer: Medicare Other | Source: Ambulatory Visit | Attending: Surgery | Admitting: Surgery

## 2014-03-15 ENCOUNTER — Ambulatory Visit (HOSPITAL_COMMUNITY): Payer: Medicare Other | Admitting: Anesthesiology

## 2014-03-15 ENCOUNTER — Encounter (HOSPITAL_COMMUNITY): Payer: Medicare Other | Admitting: Anesthesiology

## 2014-03-15 ENCOUNTER — Encounter (HOSPITAL_COMMUNITY): Payer: Self-pay | Admitting: Anesthesiology

## 2014-03-15 ENCOUNTER — Telehealth: Payer: Self-pay | Admitting: Family Medicine

## 2014-03-15 ENCOUNTER — Ambulatory Visit (HOSPITAL_COMMUNITY): Payer: Medicare Other

## 2014-03-15 DIAGNOSIS — F411 Generalized anxiety disorder: Secondary | ICD-10-CM | POA: Diagnosis not present

## 2014-03-15 DIAGNOSIS — M948X9 Other specified disorders of cartilage, unspecified sites: Secondary | ICD-10-CM | POA: Insufficient documentation

## 2014-03-15 DIAGNOSIS — I5042 Chronic combined systolic (congestive) and diastolic (congestive) heart failure: Secondary | ICD-10-CM | POA: Insufficient documentation

## 2014-03-15 DIAGNOSIS — F3289 Other specified depressive episodes: Secondary | ICD-10-CM | POA: Diagnosis not present

## 2014-03-15 DIAGNOSIS — I252 Old myocardial infarction: Secondary | ICD-10-CM | POA: Insufficient documentation

## 2014-03-15 DIAGNOSIS — I509 Heart failure, unspecified: Secondary | ICD-10-CM | POA: Diagnosis not present

## 2014-03-15 DIAGNOSIS — Z992 Dependence on renal dialysis: Secondary | ICD-10-CM | POA: Diagnosis not present

## 2014-03-15 DIAGNOSIS — I10 Essential (primary) hypertension: Secondary | ICD-10-CM | POA: Diagnosis present

## 2014-03-15 DIAGNOSIS — E785 Hyperlipidemia, unspecified: Secondary | ICD-10-CM | POA: Diagnosis not present

## 2014-03-15 DIAGNOSIS — I2510 Atherosclerotic heart disease of native coronary artery without angina pectoris: Secondary | ICD-10-CM | POA: Insufficient documentation

## 2014-03-15 DIAGNOSIS — G4733 Obstructive sleep apnea (adult) (pediatric): Secondary | ICD-10-CM | POA: Diagnosis present

## 2014-03-15 DIAGNOSIS — F329 Major depressive disorder, single episode, unspecified: Secondary | ICD-10-CM | POA: Diagnosis not present

## 2014-03-15 DIAGNOSIS — E11319 Type 2 diabetes mellitus with unspecified diabetic retinopathy without macular edema: Secondary | ICD-10-CM | POA: Diagnosis not present

## 2014-03-15 DIAGNOSIS — J449 Chronic obstructive pulmonary disease, unspecified: Secondary | ICD-10-CM | POA: Diagnosis present

## 2014-03-15 DIAGNOSIS — Z7902 Long term (current) use of antithrombotics/antiplatelets: Secondary | ICD-10-CM | POA: Diagnosis not present

## 2014-03-15 DIAGNOSIS — D649 Anemia, unspecified: Secondary | ICD-10-CM | POA: Diagnosis not present

## 2014-03-15 DIAGNOSIS — R141 Gas pain: Secondary | ICD-10-CM | POA: Diagnosis not present

## 2014-03-15 DIAGNOSIS — I69998 Other sequelae following unspecified cerebrovascular disease: Secondary | ICD-10-CM | POA: Diagnosis not present

## 2014-03-15 DIAGNOSIS — Z794 Long term (current) use of insulin: Secondary | ICD-10-CM | POA: Diagnosis not present

## 2014-03-15 DIAGNOSIS — K432 Incisional hernia without obstruction or gangrene: Secondary | ICD-10-CM

## 2014-03-15 DIAGNOSIS — K430 Incisional hernia with obstruction, without gangrene: Secondary | ICD-10-CM | POA: Diagnosis present

## 2014-03-15 DIAGNOSIS — N2581 Secondary hyperparathyroidism of renal origin: Secondary | ICD-10-CM | POA: Diagnosis not present

## 2014-03-15 DIAGNOSIS — R142 Eructation: Secondary | ICD-10-CM | POA: Diagnosis not present

## 2014-03-15 DIAGNOSIS — N186 End stage renal disease: Secondary | ICD-10-CM | POA: Insufficient documentation

## 2014-03-15 DIAGNOSIS — E118 Type 2 diabetes mellitus with unspecified complications: Secondary | ICD-10-CM | POA: Diagnosis present

## 2014-03-15 DIAGNOSIS — M6200 Separation of muscle (nontraumatic), unspecified site: Secondary | ICD-10-CM | POA: Diagnosis not present

## 2014-03-15 DIAGNOSIS — Z9861 Coronary angioplasty status: Secondary | ICD-10-CM | POA: Insufficient documentation

## 2014-03-15 DIAGNOSIS — E1139 Type 2 diabetes mellitus with other diabetic ophthalmic complication: Secondary | ICD-10-CM | POA: Diagnosis not present

## 2014-03-15 DIAGNOSIS — J4489 Other specified chronic obstructive pulmonary disease: Secondary | ICD-10-CM | POA: Insufficient documentation

## 2014-03-15 DIAGNOSIS — Z87891 Personal history of nicotine dependence: Secondary | ICD-10-CM | POA: Diagnosis not present

## 2014-03-15 DIAGNOSIS — R29898 Other symptoms and signs involving the musculoskeletal system: Secondary | ICD-10-CM | POA: Diagnosis not present

## 2014-03-15 DIAGNOSIS — R143 Flatulence: Secondary | ICD-10-CM

## 2014-03-15 DIAGNOSIS — H3581 Retinal edema: Secondary | ICD-10-CM | POA: Diagnosis not present

## 2014-03-15 DIAGNOSIS — G4730 Sleep apnea, unspecified: Secondary | ICD-10-CM | POA: Insufficient documentation

## 2014-03-15 DIAGNOSIS — I120 Hypertensive chronic kidney disease with stage 5 chronic kidney disease or end stage renal disease: Secondary | ICD-10-CM | POA: Diagnosis not present

## 2014-03-15 HISTORY — PX: CAPD INSERTION: SHX5233

## 2014-03-15 HISTORY — DX: Unspecified asthma, uncomplicated: J45.909

## 2014-03-15 HISTORY — DX: Type 2 diabetes mellitus with unspecified diabetic retinopathy without macular edema: E11.319

## 2014-03-15 HISTORY — DX: Type 2 diabetes mellitus without complications: E11.9

## 2014-03-15 HISTORY — DX: Dependence on renal dialysis: Z99.2

## 2014-03-15 HISTORY — PX: LAPAROSCOPIC LYSIS OF ADHESIONS: SHX5905

## 2014-03-15 HISTORY — PX: INCISIONAL HERNIA REPAIR: SHX193

## 2014-03-15 HISTORY — PX: PERITONEAL CATHETER INSERTION: SHX2223

## 2014-03-15 HISTORY — DX: End stage renal disease: N18.6

## 2014-03-15 HISTORY — DX: Retinal edema: H35.81

## 2014-03-15 HISTORY — DX: Obesity, unspecified: E66.9

## 2014-03-15 LAB — GLUCOSE, CAPILLARY
GLUCOSE-CAPILLARY: 148 mg/dL — AB (ref 70–99)
GLUCOSE-CAPILLARY: 118 mg/dL — AB (ref 70–99)

## 2014-03-15 LAB — POCT I-STAT 4, (NA,K, GLUC, HGB,HCT)
GLUCOSE: 141 mg/dL — AB (ref 70–99)
HCT: 30 % — ABNORMAL LOW (ref 39.0–52.0)
Hemoglobin: 10.2 g/dL — ABNORMAL LOW (ref 13.0–17.0)
POTASSIUM: 4.1 meq/L (ref 3.7–5.3)
Sodium: 142 mEq/L (ref 137–147)

## 2014-03-15 SURGERY — LAPAROSCOPIC INSERTION CONTINUOUS AMBULATORY PERITONEAL DIALYSIS  (CAPD) CATHETER
Anesthesia: General | Site: Abdomen

## 2014-03-15 MED ORDER — FENTANYL CITRATE 0.05 MG/ML IJ SOLN
INTRAMUSCULAR | Status: AC
  Filled 2014-03-15: qty 2

## 2014-03-15 MED ORDER — ALBUTEROL SULFATE HFA 108 (90 BASE) MCG/ACT IN AERS: 2.0000 | INHALATION_SPRAY | RESPIRATORY_TRACT | Status: DC | PRN

## 2014-03-15 MED ORDER — OXYCODONE HCL 5 MG/5ML PO SOLN: 5.0000 mg | Freq: Once | ORAL | Status: DC | PRN

## 2014-03-15 MED ORDER — SODIUM CHLORIDE 0.9 % IJ SOLN
3.0000 mL | INTRAMUSCULAR | Status: DC | PRN
Start: 2014-03-15 — End: 2014-03-19

## 2014-03-15 MED ORDER — PROPOFOL 10 MG/ML IV BOLUS
INTRAVENOUS | Status: AC
  Filled 2014-03-15: qty 20

## 2014-03-15 MED ORDER — BUPIVACAINE-EPINEPHRINE (PF) 0.25% -1:200000 IJ SOLN
INTRAMUSCULAR | Status: AC
Start: 2014-03-15 — End: 2014-03-15
  Filled 2014-03-15: qty 60

## 2014-03-15 MED ORDER — GLYCOPYRROLATE 0.2 MG/ML IJ SOLN
INTRAMUSCULAR | Status: AC
  Filled 2014-03-15: qty 4

## 2014-03-15 MED ORDER — SODIUM CHLORIDE 0.9 % IJ SOLN
INTRAMUSCULAR | Status: AC
  Filled 2014-03-15: qty 10

## 2014-03-15 MED ORDER — ROCURONIUM BROMIDE 50 MG/5ML IV SOLN
INTRAVENOUS | Status: AC
  Filled 2014-03-15: qty 2

## 2014-03-15 MED ORDER — PROMETHAZINE HCL 25 MG/ML IJ SOLN: 6.2500 mg | Freq: Four times a day (QID) | INTRAMUSCULAR | Status: DC | PRN

## 2014-03-15 MED ORDER — BLISTEX MEDICATED EX OINT
TOPICAL_OINTMENT | Freq: Two times a day (BID) | CUTANEOUS | Status: DC
  Administered 2014-03-15 – 2014-03-19 (×9): via TOPICAL
  Filled 2014-03-15: qty 10

## 2014-03-15 MED ORDER — NEOSTIGMINE METHYLSULFATE 10 MG/10ML IV SOLN
INTRAVENOUS | Status: DC | PRN
  Administered 2014-03-15: 5 mg via INTRAVENOUS

## 2014-03-15 MED ORDER — MIDAZOLAM HCL 2 MG/2ML IJ SOLN: 0.5000 mg | Freq: Once | INTRAMUSCULAR | Status: DC | PRN

## 2014-03-15 MED ORDER — TIOTROPIUM BROMIDE MONOHYDRATE 18 MCG IN CAPS
18.0000 ug | ORAL_CAPSULE | Freq: Every day | RESPIRATORY_TRACT | Status: DC | PRN
  Filled 2014-03-15: qty 5

## 2014-03-15 MED ORDER — CEFAZOLIN SODIUM 1-5 GM-% IV SOLN
1.0000 g | Freq: Four times a day (QID) | INTRAVENOUS | Status: AC
  Administered 2014-03-15 – 2014-03-16 (×3): 1 g via INTRAVENOUS
  Filled 2014-03-15 (×3): qty 50

## 2014-03-15 MED ORDER — MIDAZOLAM HCL 2 MG/2ML IJ SOLN
INTRAMUSCULAR | Status: AC
  Filled 2014-03-15: qty 2

## 2014-03-15 MED ORDER — LACTATED RINGERS IV BOLUS (SEPSIS): 1000.0000 mL | Freq: Three times a day (TID) | INTRAVENOUS | Status: AC | PRN

## 2014-03-15 MED ORDER — CALCITRIOL 0.5 MCG PO CAPS
0.5000 ug | ORAL_CAPSULE | ORAL | Status: DC
  Administered 2014-03-16 – 2014-03-18 (×2): 0.5 ug via ORAL
  Filled 2014-03-15 (×2): qty 1

## 2014-03-15 MED ORDER — HYDRALAZINE HCL 50 MG PO TABS
50.0000 mg | ORAL_TABLET | Freq: Three times a day (TID) | ORAL | Status: DC
  Administered 2014-03-15 – 2014-03-19 (×9): 50 mg via ORAL
  Filled 2014-03-15 (×14): qty 1

## 2014-03-15 MED ORDER — CARVEDILOL 25 MG PO TABS
37.5000 mg | ORAL_TABLET | Freq: Two times a day (BID) | ORAL | Status: DC
  Administered 2014-03-15 – 2014-03-19 (×7): 37.5 mg via ORAL
  Filled 2014-03-15 (×14): qty 1

## 2014-03-15 MED ORDER — HYDROMORPHONE HCL PF 1 MG/ML IJ SOLN
0.5000 mg | INTRAMUSCULAR | Status: DC | PRN
  Administered 2014-03-15: 1 mg via INTRAVENOUS
  Administered 2014-03-15: 2 mg via INTRAVENOUS
  Administered 2014-03-16: 1 mg via INTRAVENOUS
  Filled 2014-03-15: qty 2
  Filled 2014-03-15 (×2): qty 1
  Filled 2014-03-15: qty 2

## 2014-03-15 MED ORDER — CALCITRIOL 0.25 MCG PO CAPS: 0.2500 ug | ORAL_CAPSULE | Freq: Every day | ORAL | Status: DC

## 2014-03-15 MED ORDER — SEVELAMER CARBONATE 800 MG PO TABS
800.0000 mg | ORAL_TABLET | Freq: Three times a day (TID) | ORAL | Status: DC
  Administered 2014-03-15 – 2014-03-19 (×10): 800 mg via ORAL
  Filled 2014-03-15 (×16): qty 1

## 2014-03-15 MED ORDER — PROMETHAZINE HCL 25 MG/ML IJ SOLN
INTRAMUSCULAR | Status: AC
  Filled 2014-03-15: qty 1

## 2014-03-15 MED ORDER — MEPERIDINE HCL 25 MG/ML IJ SOLN: 6.2500 mg | INTRAMUSCULAR | Status: DC | PRN

## 2014-03-15 MED ORDER — EPHEDRINE SULFATE 50 MG/ML IJ SOLN
INTRAMUSCULAR | Status: DC | PRN
  Administered 2014-03-15: 10 mg via INTRAVENOUS
  Administered 2014-03-15: 15 mg via INTRAVENOUS
  Administered 2014-03-15 (×3): 5 mg via INTRAVENOUS
  Administered 2014-03-15: 10 mg via INTRAVENOUS

## 2014-03-15 MED ORDER — ONDANSETRON HCL 4 MG PO TABS: 4.0000 mg | ORAL_TABLET | Freq: Four times a day (QID) | ORAL | Status: DC | PRN

## 2014-03-15 MED ORDER — ONDANSETRON HCL 4 MG/2ML IJ SOLN
INTRAMUSCULAR | Status: AC
  Filled 2014-03-15: qty 2

## 2014-03-15 MED ORDER — ONDANSETRON HCL 4 MG/2ML IJ SOLN
4.0000 mg | Freq: Four times a day (QID) | INTRAMUSCULAR | Status: DC | PRN
  Administered 2014-03-16: 4 mg via INTRAVENOUS
  Filled 2014-03-15: qty 2

## 2014-03-15 MED ORDER — FENTANYL CITRATE 0.05 MG/ML IJ SOLN
25.0000 ug | INTRAMUSCULAR | Status: DC | PRN
  Administered 2014-03-15 (×4): 25 ug via INTRAVENOUS

## 2014-03-15 MED ORDER — ATORVASTATIN CALCIUM 40 MG PO TABS
40.0000 mg | ORAL_TABLET | Freq: Every day | ORAL | Status: DC
  Administered 2014-03-15 – 2014-03-18 (×4): 40 mg via ORAL
  Filled 2014-03-15 (×5): qty 1

## 2014-03-15 MED ORDER — NEOSTIGMINE METHYLSULFATE 10 MG/10ML IV SOLN
INTRAVENOUS | Status: AC
  Filled 2014-03-15: qty 2

## 2014-03-15 MED ORDER — PROMETHAZINE HCL 25 MG/ML IJ SOLN
6.2500 mg | INTRAMUSCULAR | Status: DC | PRN
  Administered 2014-03-15: 6.25 mg via INTRAVENOUS

## 2014-03-15 MED ORDER — EPHEDRINE SULFATE 50 MG/ML IJ SOLN
INTRAMUSCULAR | Status: AC
  Filled 2014-03-15: qty 1

## 2014-03-15 MED ORDER — LIDOCAINE HCL (CARDIAC) 20 MG/ML IV SOLN
INTRAVENOUS | Status: AC
  Filled 2014-03-15: qty 5

## 2014-03-15 MED ORDER — POLYETHYLENE GLYCOL 3350 17 G PO PACK: 17.0000 g | PACK | Freq: Two times a day (BID) | ORAL | Status: DC | PRN

## 2014-03-15 MED ORDER — SUCCINYLCHOLINE CHLORIDE 20 MG/ML IJ SOLN
INTRAMUSCULAR | Status: AC
  Filled 2014-03-15: qty 1

## 2014-03-15 MED ORDER — SODIUM CHLORIDE 0.9 % IV SOLN
INTRAVENOUS | Status: DC | PRN
  Administered 2014-03-15 (×2): via INTRAVENOUS

## 2014-03-15 MED ORDER — CALCITRIOL 0.25 MCG PO CAPS
0.2500 ug | ORAL_CAPSULE | ORAL | Status: DC
  Administered 2014-03-17 – 2014-03-19 (×2): 0.25 ug via ORAL
  Filled 2014-03-15 (×2): qty 1

## 2014-03-15 MED ORDER — HEPARIN SODIUM (PORCINE) 5000 UNIT/ML IJ SOLN
5000.0000 [IU] | Freq: Three times a day (TID) | INTRAMUSCULAR | Status: DC
  Administered 2014-03-16 – 2014-03-19 (×10): 5000 [IU] via SUBCUTANEOUS
  Filled 2014-03-15 (×12): qty 1

## 2014-03-15 MED ORDER — MAGIC MOUTHWASH: 15.0000 mL | Freq: Four times a day (QID) | ORAL | Status: DC | PRN

## 2014-03-15 MED ORDER — INSULIN DETEMIR 100 UNIT/ML ~~LOC~~ SOLN
75.0000 [IU] | Freq: Two times a day (BID) | SUBCUTANEOUS | Status: DC
  Filled 2014-03-15 (×2): qty 0.75

## 2014-03-15 MED ORDER — MENTHOL 3 MG MT LOZG: 1.0000 | LOZENGE | OROMUCOSAL | Status: DC | PRN

## 2014-03-15 MED ORDER — PHENOL 1.4 % MT LIQD: 2.0000 | OROMUCOSAL | Status: DC | PRN

## 2014-03-15 MED ORDER — LIP MEDEX EX OINT
1.0000 "application " | TOPICAL_OINTMENT | Freq: Two times a day (BID) | CUTANEOUS | Status: DC
  Filled 2014-03-15: qty 7

## 2014-03-15 MED ORDER — SOOTHE HYDRATION 1.25 % OP SOLN: 1.0000 [drp] | Freq: Three times a day (TID) | OPHTHALMIC | Status: DC

## 2014-03-15 MED ORDER — POLYVINYL ALCOHOL 1.4 % OP SOLN
1.0000 [drp] | Freq: Three times a day (TID) | OPHTHALMIC | Status: DC
  Administered 2014-03-15 – 2014-03-19 (×11): 1 [drp] via OPHTHALMIC
  Filled 2014-03-15 (×2): qty 15

## 2014-03-15 MED ORDER — GLYCOPYRROLATE 0.2 MG/ML IJ SOLN
INTRAMUSCULAR | Status: DC | PRN
  Administered 2014-03-15: .8 mg via INTRAVENOUS

## 2014-03-15 MED ORDER — DIPHENHYDRAMINE HCL 50 MG/ML IJ SOLN
12.5000 mg | Freq: Four times a day (QID) | INTRAMUSCULAR | Status: DC | PRN
  Filled 2014-03-15: qty 1

## 2014-03-15 MED ORDER — SODIUM CHLORIDE 0.9 % IJ SOLN
3.0000 mL | Freq: Two times a day (BID) | INTRAMUSCULAR | Status: DC
  Administered 2014-03-16 – 2014-03-18 (×2): 3 mL via INTRAVENOUS

## 2014-03-15 MED ORDER — PHENYLEPHRINE 40 MCG/ML (10ML) SYRINGE FOR IV PUSH (FOR BLOOD PRESSURE SUPPORT)
PREFILLED_SYRINGE | INTRAVENOUS | Status: AC
  Filled 2014-03-15: qty 10

## 2014-03-15 MED ORDER — ROCURONIUM BROMIDE 50 MG/5ML IV SOLN
INTRAVENOUS | Status: AC
  Filled 2014-03-15: qty 1

## 2014-03-15 MED ORDER — PHENYLEPHRINE 40 MCG/ML (10ML) SYRINGE FOR IV PUSH (FOR BLOOD PRESSURE SUPPORT)
PREFILLED_SYRINGE | INTRAVENOUS | Status: AC
  Filled 2014-03-15: qty 30

## 2014-03-15 MED ORDER — LIDOCAINE HCL (CARDIAC) 20 MG/ML IV SOLN
INTRAVENOUS | Status: DC | PRN
  Administered 2014-03-15: 75 mg via INTRAVENOUS
  Administered 2014-03-15: 25 mg via INTRAVENOUS

## 2014-03-15 MED ORDER — ARTIFICIAL TEARS OP OINT
TOPICAL_OINTMENT | OPHTHALMIC | Status: AC
  Filled 2014-03-15: qty 3.5

## 2014-03-15 MED ORDER — BISACODYL 10 MG RE SUPP: 10.0000 mg | Freq: Two times a day (BID) | RECTAL | Status: DC | PRN

## 2014-03-15 MED ORDER — ALUM & MAG HYDROXIDE-SIMETH 200-200-20 MG/5ML PO SUSP: 30.0000 mL | Freq: Four times a day (QID) | ORAL | Status: DC | PRN

## 2014-03-15 MED ORDER — BUPIVACAINE-EPINEPHRINE 0.25% -1:200000 IJ SOLN
INTRAMUSCULAR | Status: DC | PRN
  Administered 2014-03-15: 90 mL

## 2014-03-15 MED ORDER — FENTANYL CITRATE 0.05 MG/ML IJ SOLN
INTRAMUSCULAR | Status: DC | PRN
  Administered 2014-03-15: 100 ug via INTRAVENOUS
  Administered 2014-03-15: 50 ug via INTRAVENOUS
  Administered 2014-03-15 (×2): 100 ug via INTRAVENOUS

## 2014-03-15 MED ORDER — PROPOFOL 10 MG/ML IV BOLUS
INTRAVENOUS | Status: DC | PRN
  Administered 2014-03-15: 80 mg via INTRAVENOUS

## 2014-03-15 MED ORDER — ROCURONIUM BROMIDE 100 MG/10ML IV SOLN
INTRAVENOUS | Status: DC | PRN
  Administered 2014-03-15: 15 mg via INTRAVENOUS
  Administered 2014-03-15: 30 mg via INTRAVENOUS
  Administered 2014-03-15: 20 mg via INTRAVENOUS

## 2014-03-15 MED ORDER — OXYCODONE HCL 5 MG PO TABS: 5.0000 mg | ORAL_TABLET | Freq: Once | ORAL | Status: DC | PRN

## 2014-03-15 MED ORDER — MIDAZOLAM HCL 5 MG/5ML IJ SOLN
INTRAMUSCULAR | Status: DC | PRN
  Administered 2014-03-15: 2 mg via INTRAVENOUS

## 2014-03-15 MED ORDER — BUPIVACAINE-EPINEPHRINE (PF) 0.25% -1:200000 IJ SOLN
INTRAMUSCULAR | Status: AC
  Filled 2014-03-15: qty 30

## 2014-03-15 MED ORDER — FENTANYL CITRATE 0.05 MG/ML IJ SOLN
INTRAMUSCULAR | Status: AC
  Filled 2014-03-15: qty 5

## 2014-03-15 MED ORDER — PHENYLEPHRINE HCL 10 MG/ML IJ SOLN
INTRAMUSCULAR | Status: DC | PRN
  Administered 2014-03-15: 80 ug via INTRAVENOUS
  Administered 2014-03-15: 120 ug via INTRAVENOUS
  Administered 2014-03-15: 80 ug via INTRAVENOUS
  Administered 2014-03-15: 120 ug via INTRAVENOUS
  Administered 2014-03-15: 80 ug via INTRAVENOUS
  Administered 2014-03-15: 120 ug via INTRAVENOUS
  Administered 2014-03-15: 80 ug via INTRAVENOUS
  Administered 2014-03-15: 120 ug via INTRAVENOUS

## 2014-03-15 MED ORDER — OXYCODONE HCL 5 MG PO TABS
5.0000 mg | ORAL_TABLET | ORAL | Status: DC | PRN
Start: 2014-03-15 — End: 2014-03-17

## 2014-03-15 MED ORDER — ONDANSETRON HCL 4 MG/2ML IJ SOLN
INTRAMUSCULAR | Status: DC | PRN
  Administered 2014-03-15: 4 mg via INTRAVENOUS

## 2014-03-15 MED ORDER — METOPROLOL TARTRATE 1 MG/ML IV SOLN: 5.0000 mg | Freq: Four times a day (QID) | INTRAVENOUS | Status: DC | PRN

## 2014-03-15 MED ORDER — HEPARIN SODIUM (PORCINE) 5000 UNIT/ML IJ SOLN
INTRAMUSCULAR | Status: DC | PRN
  Administered 2014-03-15: 07:00:00

## 2014-03-15 MED ORDER — ALBUTEROL SULFATE (2.5 MG/3ML) 0.083% IN NEBU: 2.5000 mg | INHALATION_SOLUTION | RESPIRATORY_TRACT | Status: DC | PRN

## 2014-03-15 MED ORDER — BUPIVACAINE 0.25 % ON-Q PUMP DUAL CATH 300 ML
300.0000 mL | INJECTION | Status: DC
  Filled 2014-03-15: qty 300

## 2014-03-15 MED ORDER — CALCITRIOL 0.25 MCG PO CAPS
0.2500 ug | ORAL_CAPSULE | Freq: Once | ORAL | Status: AC
  Administered 2014-03-15: 0.25 ug via ORAL
  Filled 2014-03-15: qty 1

## 2014-03-15 MED ORDER — OXYCODONE HCL 5 MG PO TABS
5.0000 mg | ORAL_TABLET | ORAL | Status: DC | PRN
  Administered 2014-03-15 – 2014-03-16 (×2): 10 mg via ORAL
  Administered 2014-03-16: 5 mg via ORAL
  Administered 2014-03-16 – 2014-03-17 (×3): 10 mg via ORAL
  Filled 2014-03-15 (×5): qty 2

## 2014-03-15 MED ORDER — ACETAMINOPHEN 500 MG PO TABS
1000.0000 mg | ORAL_TABLET | Freq: Three times a day (TID) | ORAL | Status: AC
  Administered 2014-03-15 – 2014-03-16 (×3): 1000 mg via ORAL
  Filled 2014-03-15 (×3): qty 2

## 2014-03-15 SURGICAL SUPPLY — 62 items
ADAPTER TITANIUM MEDIONICS (MISCELLANEOUS) ×3 IMPLANT
ADPR DLYS CATH STRL LF DISP (MISCELLANEOUS) ×1
APPLICATOR COTTON TIP 6IN STRL (MISCELLANEOUS) ×2 IMPLANT
BAG DECANTER FOR FLEXI CONT (MISCELLANEOUS) ×3 IMPLANT
BINDER ABD UNIV 10 28-50 (GAUZE/BANDAGES/DRESSINGS) IMPLANT
BINDER ABDOM UNIV 10 (GAUZE/BANDAGES/DRESSINGS) ×3
BLADE SURG ROTATE 9660 (MISCELLANEOUS) ×2 IMPLANT
CANISTER SUCTION 2500CC (MISCELLANEOUS) IMPLANT
CATH EXTENDED DIALYSIS (CATHETERS) ×3 IMPLANT
CHLORAPREP W/TINT 26ML (MISCELLANEOUS) ×3 IMPLANT
COVER SURGICAL LIGHT HANDLE (MISCELLANEOUS) ×3 IMPLANT
DECANTER SPIKE VIAL GLASS SM (MISCELLANEOUS) ×3 IMPLANT
DEVICE SECURE STRAP 25 ABSORB (INSTRUMENTS) ×3 IMPLANT
DEVICE TROCAR PUNCTURE CLOSURE (ENDOMECHANICALS) ×5 IMPLANT
DISSECTOR BLUNT TIP ENDO 5MM (MISCELLANEOUS) IMPLANT
DRAPE UTILITY 15X26 W/TAPE STR (DRAPE) ×6 IMPLANT
DRAPE WARM FLUID 44X44 (DRAPE) ×3 IMPLANT
DRSG OPSITE 6X11 MED (GAUZE/BANDAGES/DRESSINGS) ×2 IMPLANT
DRSG PAD ABDOMINAL 8X10 ST (GAUZE/BANDAGES/DRESSINGS) IMPLANT
DRSG TEGADERM 4X4.75 (GAUZE/BANDAGES/DRESSINGS) IMPLANT
ELECT REM PT RETURN 9FT ADLT (ELECTROSURGICAL) ×3
ELECTRODE REM PT RTRN 9FT ADLT (ELECTROSURGICAL) ×1 IMPLANT
GAUZE SPONGE 2X2 8PLY STRL LF (GAUZE/BANDAGES/DRESSINGS) ×1 IMPLANT
GLOVE BIO SURGEON STRL SZ7 (GLOVE) ×4 IMPLANT
GLOVE BIOGEL PI IND STRL 7.0 (GLOVE) IMPLANT
GLOVE BIOGEL PI IND STRL 7.5 (GLOVE) ×1 IMPLANT
GLOVE BIOGEL PI IND STRL 8 (GLOVE) ×1 IMPLANT
GLOVE BIOGEL PI INDICATOR 7.0 (GLOVE) ×4
GLOVE BIOGEL PI INDICATOR 7.5 (GLOVE) ×2
GLOVE BIOGEL PI INDICATOR 8 (GLOVE) ×2
GLOVE ECLIPSE 7.5 STRL STRAW (GLOVE) ×2 IMPLANT
GLOVE ECLIPSE 8.0 STRL XLNG CF (GLOVE) ×3 IMPLANT
GLOVE SURG SS PI 7.0 STRL IVOR (GLOVE) ×3 IMPLANT
GOWN STRL REUS W/ TWL LRG LVL3 (GOWN DISPOSABLE) ×2 IMPLANT
GOWN STRL REUS W/ TWL XL LVL3 (GOWN DISPOSABLE) ×1 IMPLANT
GOWN STRL REUS W/TWL LRG LVL3 (GOWN DISPOSABLE) ×18
GOWN STRL REUS W/TWL XL LVL3 (GOWN DISPOSABLE) ×3
KIT BASIN OR (CUSTOM PROCEDURE TRAY) ×3 IMPLANT
KIT ROOM TURNOVER OR (KITS) ×3 IMPLANT
MESH VENTRALIGHT ST 7X9N (Mesh General) ×2 IMPLANT
NDL SPNL 22GX3.5 QUINCKE BK (NEEDLE) IMPLANT
NEEDLE 22X1 1/2 (OR ONLY) (NEEDLE) ×3 IMPLANT
NEEDLE SPNL 22GX3.5 QUINCKE BK (NEEDLE) ×3 IMPLANT
NS IRRIG 1000ML POUR BTL (IV SOLUTION) ×3 IMPLANT
PAD ARMBOARD 7.5X6 YLW CONV (MISCELLANEOUS) ×6 IMPLANT
SET EXT 12IN DIALYSIS STAY-SAF (MISCELLANEOUS) ×3 IMPLANT
SET IRRIG TUBING LAPAROSCOPIC (IRRIGATION / IRRIGATOR) IMPLANT
SLEEVE ENDOPATH XCEL 5M (ENDOMECHANICALS) ×3 IMPLANT
SPONGE GAUZE 2X2 STER 10/PKG (GAUZE/BANDAGES/DRESSINGS) ×2
STYLET FALLER (MISCELLANEOUS) ×3 IMPLANT
STYLET FALLER MEDIONICS (MISCELLANEOUS) ×3 IMPLANT
SUT MNCRL AB 4-0 PS2 18 (SUTURE) ×3 IMPLANT
SUT PROLENE 1 CT (SUTURE) ×14 IMPLANT
SUT PROLENE 2 0 CT2 30 (SUTURE) ×11 IMPLANT
SUT SILK 2 0 SH (SUTURE) IMPLANT
SUT VICRYL 0 UR6 27IN ABS (SUTURE) ×2 IMPLANT
SYR 50ML LL SCALE MARK (SYRINGE) ×3 IMPLANT
TOWEL OR 17X24 6PK STRL BLUE (TOWEL DISPOSABLE) ×3 IMPLANT
TOWEL OR 17X26 10 PK STRL BLUE (TOWEL DISPOSABLE) ×3 IMPLANT
TRAY LAPAROSCOPIC (CUSTOM PROCEDURE TRAY) ×3 IMPLANT
TROCAR 5MMX150MM (TROCAR) ×3 IMPLANT
TROCAR XCEL NON-BLD 5MMX100MML (ENDOMECHANICALS) ×3 IMPLANT

## 2014-03-15 NOTE — Discharge Instructions (Signed)
PERITONEAL DIALYSIS (CAPD) CATHETER PLACEMENT: ° °POST OPERATIVE INSTRUCTIONS ° °FOLLOW UP with the Peritoneal Dialysis Nurses ° [address] [place], [place] [address] ° °Call [phone] or your neprologist to help arrange training/flushes of your CAPD catheter °  °The CAPD nurses & Nephrology usually follow you closely, making the need for follow-up in the [facility] surgery office redundant and therefore not always needed.  If they or you have concerns, please call us for possible follow-up in our office ° °1. Do NOT shower until dialysis nursing staff have advised.  Do NOT submerge in a bathtub or hot tub. °2. Your Home Therapy RN will advise and educate you on showering, bathing, and swimming when you are in training. °3. The Peritoneal Dialysis nurse will remove your waterproof bandages in the Dialysis Center a few days after surgery.  Do not remove the bandages until seen by them.  If you dressing becomes wet, saturated, or falls off call your Home Therapy RN. °4. ACTIVITIES as tolerated:   °a. You may resume regular (light) daily activities beginning the next day--such as daily self-care, walking, climbing stairs--gradually increasing activities as tolerated.  If you can walk 30 minutes without difficulty, it is safe to try more intense activity such as jogging, treadmill, bicycling, low-impact aerobics,  etc. °b. No swimming within the 1st month of catheter placement.  You must have a Dr's order. °c. Save the most intensive and strenuous activity for last such as sit-ups, heavy lifting, contact sports, etc  Refrain from any heavy lifting or straining until you are off narcotics for pain control.   °d. DO NOT PUSH THROUGH PAIN.  Let pain be your guide: If it hurts to do something, don't do it.  Pain is your body warning you to avoid that activity for another week until the pain goes down. °e. You may drive when you are no longer taking prescription pain medication, you can comfortably wear a seatbelt, and you can  safely maneuver your car and apply brakes. °f. You may have sexual intercourse when it is comfortable.  °g. Be sure your catheter is taped to Your abdomen nor injured. Did not allow catheter to ankle, or tension on the catheter-it may cause damage to skin over the catheter °h. You will be instructed on what to do an emergency, and will have access to on call our in 24 hours a day. The number to reach the home therapy nurse as listed below. °5. DIET: Follow a light bland diet the first 24 hours after arrival home, such as soup, liquids, crackers, etc.  Be sure to include lots of fluids daily.  Avoid fast food or heavy meals as your are more likely to get nauseated.   °6. Take your usually prescribed home medications unless otherwise directed. °7. PAIN CONTROL: °a. Pain is best controlled by a usual combination of three different methods TOGETHER: °i. Ice/Heat °ii. Tylenol (over the counter pain medication) °iii. Prescription pain medication °b. Most patients will experience some swelling and bruising around the incisions.  Ice packs or heating pads (30-60 minutes up to 6 times a day) will help. Use ice for the first few days to help decrease swelling and bruising, then switch to heat to help relax tight/sore spots and speed recovery.  Some people prefer to use ice alone, heat alone, alternating between ice & heat.  Experiment to what works for you.  Swelling and bruising can take several weeks to resolve.   °c. It is helpful to take   an over-the-counter pain medication regularly for the first few weeks.  Using acetaminophen (Tylenol, etc) 500-650mg four times a day (every meal & bedtime) is usually safest since NSAIDs are not advisable in patients with kidney disease. °d. A  prescription for pain medication (such as oxycodone, hydrocodone, etc) should be given to you upon discharge.  Take your pain medication as prescribed.  °i. If you are having problems/concerns with the prescription medicine (does not control pain,  nausea, vomiting, rash, itching, etc), please call us [phone] to see if we need to switch you to a different pain medicine that will work better for you and/or control your side effect better. °ii. If you need a refill on your pain medication, please contact your pharmacy.  They will contact our office to request authorization. Prescriptions will not be filled after 5 pm or on week-ends. °8. Avoid getting constipated.  Between the surgery and the pain medications, it is common to experience some constipation.  Increasing fluid intake and taking a fiber supplement (such as Metamucil, Citrucel, FiberCon, MiraLax, etc) 1-2 times a day regularly will usually help prevent this problem from occurring.  A mild laxative (prune juice, Milk of Magnesia, MiraLax, etc) should be taken according to package directions if there are no bowel movements after 48 hours.  ° ° ° ° ° °FOLLOW UP: °Peritoneal Dialysis nurses ° [address] [place], [place] [address] ° °Call [phone] or your neprologist to help arrange training/flushes of your CAPD catheter °  ° -The CAPD nurses & Nephrology usually follow you closely, making the need for follow-up in our office redundant and therefore not needed.  If they or you have concerns, please call us for possible follow-up in our office ° ° -Please call [facility] at [phone] only as needed. ° °WHEN TO CALL US [phone]: °1. Poor pain control °2. Reactions / problems with new medications (rash/itching, nausea, etc)  °3. Fever over 101.5 F (38.5 C) °4. Worsening swelling or bruising °5. Continued bleeding from incision. °6. Increased pain, redness, or drainage from the incision ° ° The clinic staff is available to answer your questions during regular business hours (8:30am-5pm).  Please don’t hesitate to call and ask to speak to one of our nurses for clinical concerns.  ° If you have a medical emergency, go to the nearest emergency room or call 911. ° A surgeon from [facility] is always on call at the hospitals ° °9. IF YOU HAVE DISABILITY OR FAMILY LEAVE FORMS, BRING THEM TO THE OFFICE FOR PROCESSING.  DO NOT GIVE THEM TO YOUR DOCTOR. ° °[facility] °[address], [address], [place], [place]  [address] ? °MAIN: [phone] ? TOLL FREE: [phone] ?  °FAX [phone] °[email or web address] ° °Peritoneal Dialysis - An Overview °Dialysis can be done using a machine outside of the body (hemodialysis). Or, it can be done inside the body (peritoneal dialysis). The word "peritoneal" refers to the lining or membrane of the belly (abdominal cavity). The peritoneal membrane is a thin, plastic-like lining inside the belly that covers the organs and fits in the abdominal or peritoneal cavity, such as the stomach, liver and the kidneys. This lining works like a filter. It will allow certain things to pass from your blood through the lining and into a special solution that has been placed into your belly. In this type of dialysis, the peritoneum is used to help clean the blood.  °If you need dialysis, your   kidneys are not working right. Healthy kidneys take out extra water and waste products, which becomes urine. When the kidneys do not do this, serious problems can develop. The waste and water build up in the blood. Your hands and feet might swell. You may feel tired, weak or sick to your stomach. Also, your blood pressure may rise. If not treated, you could die. Dialysis is a treatment that does the work that your kidneys would do if they were healthy. °· It cleans your blood.  °· It will make sure your body has the right amount of certain chemicals that it needs. They include potassium, sodium and bicarbonate.  °· It will help control your blood pressure.  °UNDERSTANDING PERITONEAL DIALYSIS °· Here is how peritoneal dialysis works:  °· First, you will have surgery to put a soft plastic tube (catheter) into your belly (abdomen). This will allow you  to easily connect yourself to special tubing, which will then let a special dialysis solution to be placed into your abdomen.  °· For each treatment, you will need at least one bag of dialysis solution (a liquid called dialysate). It is a mix of water that is pure and free of germs (sterile), sugar (dextrose) and the nutrients and minerals found in your blood. Sometimes, more than one bag is needed to get the right amount of fluid for your abdomen. Your caregiver will explain what size and how many bags you will need.  °· The dialysate is slowly put through the catheter to fill the abdomen (called the peritoneal cavity). This dialysate will need to stay in your body for 3-4 hours. This is known as the dwell time.  °· The solution is working to clean the blood and remove wastes from your body. At the end of this time, the solution is drained from your body through tubing into an empty bag. It is then replaced with a fresh dialysate.  °· The draining and replacing of the dialysate is called an exchange or cycle. The catheter is capped after each exchange. Once the solution is in your body, you are then free to do whatever you would like until the next exchange. Most people will need to do 4-5 exchanges each day.  °· There are two different methods that can be used.  °· Continuous ambulatory peritoneal dialysis (CAPD): You put the solution into your abdomen, cap your catheter and then go about your day. Several hours later, you reconnect to a tubing set up, drain out the solution and then put more solution in. This is done several times a day. No machine is needed.  °· Continuous cycler-assisted peritoneal dialysis (CCPD): A machine is used, which fills the abdomen with dialysate and then drains it. This happens several times. It usually is done at night while you are sleeping. When you wake up, you can disconnect from the machine and are free to go to go about your day.  °PREPARING FOR EXCHANGES °· Discuss the details  of the procedure with your caregivers. You will be working with a nurse who is specially trained in doing dialysis. Make sure you understand:  °· How to do an exchange.  °· How much solution you need.  °· What type of solution you will need.  °· How often you should do an exchange. Ask:  °· How many times each day?  °· When? At meals? At bedtime?  °· Always keep the dialysate bags and other supplies in a cool, clean and dry place.  °·   Keeping everything clean is very important.  °· The catheter and its cap must be free from germs (sterile)  °· The adapter also must be sterile. It attaches the dialysis bag and tubing to the catheter.  °· Clean the area of your body around the catheter every day. Use a chemical that fights infection (antiseptic).  °· Wash your hands thoroughly before starting an exchange.  °· You may be taught to wear a mask to cover your nose and mouth. This makes infection less likely to happen.  °· You may be taught to close doors, windows and turn off any fans before doing an exchange.  °· Check the dialysate bag very carefully.  °· Make sure it is the right size bag for you. This information is on the label.  °· Also, make sure it is the right mixture. For some people, the dialysate contents vary. For instance, the mixture might be a stronger solution for overnight.  °· Check the expiration date (the last date you can use the bag). It also is on the label. If the date has gone by, throw away the bag.  °· The solution should be clear. You should be able to see any writing on the side of the bag clearly through the solution. Do not use a cloudy solution.  °· Gently squeeze the bag to make sure there are no leaks.  °· Use a dry heating pad to warm the dialysate in the bag. Leave the cover on the bag while you do this.  °· This is for comfort. You can skip this step if you want.  °· Never place the bag of solution under warm or hot water. Water from a faucet is not sterile and could cause germs to  get into the bag. Infection could then result.  °PERFORMING AN EXCHANGE °· For continuous ambulatory dialysis:  °· Attach the dialysis bag and tubing to your catheter. Hang the bag so that gravity (the natural downward pull) draws the solution down and into your abdomen once the clamps are opened. This should take about 10 minutes.  °· Remove the bag and tubing from the catheter. Cap the catheter.  °· The solution stays in the abdomen for 3-4 hours (dwell time). The solution is working to clean the blood and remove wastes from your body.  °· When you are ready to drain the solution for another exchange, take the cap off the catheter. Then, attach the catheter to tubing, which is attached to an empty bag. Place this empty bag below the abdomen or on the floor or stool and undo the clamps.  °· Gravity helps pull the fluid out of the abdomen and into the bag. The fluid in the bag may look yellow and clear, like urine. It usually takes about 20 minutes to drain the fluid out of the abdomen.  °· When the solution has drained, start the process again by infusing a new bag of dialysate and then capping the catheter.  °· This should continue until you have used all of the solution that you are to use each day.  °· Sometimes, a small machine is used overnight. It is called a mini-cycler. This is done if the body cannot go all night without an exchange. The machine lets you sleep without having to get up and do an exchange.  °· For continuous cycler-assisted dialysis:  °· You will be taught how to set up or program your machine.  °· When you are ready for bed,   put the dialysate bags onto the cycler machine. Put on exactly the number of bags that your caregiver said to use.  °· Connect your catheter to the machine and turn the cycler machine on.  °· Overnight, the cycler will do several exchanges. It often does three to five, sometimes more.  °· Solution that is in your abdomen in the morning will stay during the day. The  machine is set to make the daytime solution stronger, if that is needed.  °· In the morning, you will disconnect from the machine and cap your catheter and go about your day.  °· Sometimes, an extra exchange is done during the day. This may be needed to remove excess waste or fluid.  °IMPORTANT REMINDERS °· You will need to follow a very strict schedule. Every step of the dialysis procedure must be done every day. Sometimes, several times a day. Altogether, this might take an extra 2 hours or more. However, you must stick to the routine. Do not skip a day. Do not skip a procedure.  °· Some people find it helpful to work with a counselor or social worker in addition to the renal (kidney) nurse. They can help you figure out how to change your daily routine to fit in the dialysis sessions.  °· You may need to change your diet. Ask your caregiver for advice, or talk with a nutritionist about what you should and should not eat.  °· You will need to weigh yourself every day and keep track of what your weight is.  °· You may be taught how to check your blood pressure before every exchange. Your blood pressure reading will help determine what type of solution to use. If your blood pressure is too high, you may need a stronger solution.  °RISKS AND COMPLICATIONS  °Possible problems vary, depending on the method you use. Your overall health also can have an effect. Problems that could develop because of dialysis include: °· Infection. This is the most common problem. It could occur:  °· In the peritoneum. This is called peritonitis.  °· Around the catheter.  °· Weight gain. The dialysate contains a type of sugar known as dextrose. Dextrose has a lot of calories. The body takes in several hundred calories from this sugar each day.  °· Weakened muscles in the abdomen. This can result from all of the fluid that your body has to hold in the abdomen.  °· Catheter replacement. Sometimes, a new one has to be put in.  °· Change in  dialysis method. Due to some complications, you may need to change to hemodialysis for a short time and have your dialysis done at a center.  °· Trouble adjusting to your new lifestyle. In some people, this leads to depression.  °· Sleep problems.  °· Dialysis-related amyloidosis. This sometimes occurs after 5 years of dialysis. Protein builds up in the blood. This can cause painful deposits on bones, joints and tendons (which connect muscle to bone). Or, it can cause hollow spots in bones that make them more likely to break.  °· Excess fluid. Your body may absorb too much of the fluid that is held in the abdomen. This can lead to heart or lung problems.  °SEEK MEDICAL CARE IF:  °· You have any problems with an exchange.  °· The area around the catheter becomes red or painful.  °· The catheter seems loose, or it feels like it is coming out.  °· A bag of dialysate looks   cloudy. Or, the liquid is an unusual color.  °· Abdominal pain or discomfort.  °· You feel sick to your stomach (nauseous) or throw up (vomit).  °· You develop a fever of more than 102° F (38.9° C).  °SEEK IMMEDIATE MEDICAL CARE IF:  °You develop a fever of more than 102° F (38.9° C). °Document Released: 07/08/2009 Document Revised: 08/30/2011 Document Reviewed: 07/08/2009 °ExitCare® Patient Information ©2012 [employer]. ° °Diet for Peritoneal Dialysis °This diet may be modified in protein, sodium, phosphorus, potassium, or fluid, depending on your needs. The goals of nutrition therapy are similar to those for patients on hemodialysis. Providing enough protein to replace peritoneal losses is a priority. °USES OF THIS DIET °The diet is designed for the patient with end-stage kidney (renal) disease, who is treated by peritoneal dialysis. Treatment options include: °· Continuous Ambulatory Peritoneal Dialysis (CAPD): Usually 4 exchanges of 1.5 to 2 liter volumes of glucose (sugar) and electrolyte-containing dialysate.  °· Continuous Cyclic Peritoneal  Dialysis (CCPD): Essentially a reversal of CAPD, with shorter exchanges at night and a longer one during the day.  °· Intermittent Peritoneal Dialysis (IPD): 10 to 12 hours of exchanges, 2 to 3 times weekly.  °ADEQUACY °The diet may not meet the Recommended Dietary Allowances of the National Research Council for calcium and ascorbic acid. Protein and water-soluble vitamin needs may be increased because of losses into the dialysate. Recommended daily supplements are the same as for hemodialysis patients. °ASSESSMENT/DETERMINATION OF DIET °Dietary needs will differ between patients. Parameters must be individualized. °Protein °· Guidelines: 1.2 to 1.3 gm/kg/day OR 1.5 gm/kg/day if patient is malnourished, catabolic, or has a protracted episode of peritonitis. A minimum of 50% of the protein intake should be of high biological value.  °· Goals: Meet protein requirements and replace dialysate losses while avoiding excessive accumulation of waste products. Achieve serum albumin greater than 3.5 g/dL.  °· Evaluate: Current nutritional status, serum albumin and BUN levels, presence of peritonitis.  °Sodium °· Guidelines: Usually 90 to 175 mEq (2000 to 4000 mg), but should be individualized.  °· Goals: Minimize complications of fluid imbalance.  °· Evaluate: Weight, blood pressure regulation, and presence of swelling (edema).  °Potassium °· Guidelines: Individualized; often not restricted, and may need to be supplemented.  °· Goals: Serum K+ levels between 4.0 to 5.0 mEq/L.  °· Evaluate: Serum K+ levels, usual intake of K+, appetite.  °Phosphorus °· Guidelines: 800 to 1200 mg/day (the high protein intake results in a high obligatory P intake).  °· Goal: Serum P levels between 4.5 to 6.0 mg/dL.  °· Evaluate: Serum P levels, usual P intake, P-binding medications: type, number, dosage, distribution.  °Fluids °· Guidelines: Individualized - may not be restricted for all patients.  °· Goal: Minimize complications of fluid  imbalance.  °· Evaluate: Weight, blood pressure regulation, sodium intake, and presence of edema.  °Document Released: 09/10/2005 Document Revised: 08/30/2011 Document Reviewed: 12/03/2006 °ExitCare® Patient Information ©2012 [employer]. °

## 2014-03-15 NOTE — Progress Notes (Signed)
T visited pt on first rounds, and pt requested return for cpap 07/1129.  RT returned and pt was sick to his stomach and feeling nauseated.  Pt requested not to be placed on cpap at this time.  RT told pt to call RN if he starts to feel better and she would cal RT to place him on cpap.  RT will continue to monitor.

## 2014-03-15 NOTE — H&P (Signed)
Jonathon  Bailey., [place], [place] [id number] Phone: [phone] FAX: [place]  August 26, 1956 [id number]  CARE TEAM:  PCP: Thersa Salt, DO  Outpatient Care Team: Patient Care Team: [name], DO as PCP - General (Pediatrics) [name], MD as Consulting Physician (Nephrology) [name], MD as Consulting Physician (Nephrology)  Inpatient Treatment Team: Treatment Team: Attending Provider: [name], MD  This patient is a 58 y.o.male who presents today for surgical evaluation at the request of Dr. Marval Regal.   Reason for visit: Dialysis-dependent. Consider for peritoneal dialysis catheter placement   Pleasant morbidly obese male on Plavix for coronary artery disease. History of chronic heart failure. Recently est. With [facility] for his cardiac care. He gets hemodialysis Tuesday Thursday Saturday. Gets it through a dialysis catheter in his right chest. Had a left AV fistula that apparently has not adequately matured. He prefers to have Hickman catheters. He was wondering about the possibility of using peritoneal dialysis since he has a friend who is starting that. Surgical consultation recommended.   He had an umbilical hernia repaired in 1990s by Dr. Bubba Camp. He developed a recurrence. He was considering surgery in 2009 with Dr Hulen Skains. Cardiac clearance was recommended. Surgery did not happen. Patient walks with some assistance. Claims to walk 2 blocks without much difficulty. He no longer smokes. No history of infections. Daily bowel movements without any history of bowel obstructions.  No new events  Past Medical History  Diagnosis Date  . Diabetes mellitus   . Hyperlipidemia   . Hypertension   . Myocardial infarction     status post MI x2 and 3 stents placed in 2003  . Stroke ~ 2007; ~1987    "weak on right side; messed w/right side of brain; cry all the time"  . Sleep  apnea     "sleep w/CPAP sometimes"  . Arthritis   . Anxiety   . Depression   . Chronic systolic heart failure Q000111Q    ECHO Feb 2013 showed LVEF low normal at 50-55%, +hypokinetic anterolateral wall and inferolateral wall.    . CKD (chronic kidney disease) stage 4, GFR 15-29 ml/min 08/30/2009    Progressive renal failure since 2008, creatinine 1.2 in 2008 up to 3.5 in 2012 and 3.2-5.0 in 2013. All UA's 2011-13 showed >300 protein on dipstick. Work-up in May 2011 showed negative Urine IFE and SPEP, ultrasound showed 12-13 cm kidneys with increased echogenicity and UPC ratio was 1.5 gm proteinuria.  Hgb A1C's from 2011 to 2013 were all between 9-11.  Patient saw Dr. Donnetta Hutching (vasc surgery) for HD access in Aug 2013 > vein mapping was done and Dr. Donnetta Hutching felt the left arm (pt is R handed) was suitable for L arm Cimino radiocephalic fistula. Patient said he wasn't ready to consider doing dialysis and declined the surgery.     . Obesity   . Macular edema   . Diabetic retinopathy     Past Surgical History  Procedure Laterality Date  . Coronary stent placement    . Umbilical hernia repair      umbilical  . Coronary angioplasty with stent placement  ~ 2002    "3"  . Av fistula placement  10/17/2012    Procedure: ARTERIOVENOUS (AV) FISTULA CREATION;  Surgeon: Mal Misty, MD;  Location: [facility];  Service: Vascular;  Laterality: Left;    History   Social History  .  Marital Status: Divorced    Spouse Name: Jonathon Bailey    Number of Children: Jonathon Bailey  . Years of Education: Jonathon Bailey   Occupational History  . Not on file.   Social History Main Topics  . Smoking status: Former Smoker -- 1.00 packs/day for 15 years    Types: Cigarettes    Quit date: 09/24/1986  . Smokeless tobacco: Never Used  . Alcohol Use: No     Comment: stopped in 2013  . Drug Use: No  . Sexual Activity: Not Currently   Other Topics Concern  . Not on file   Social History Narrative  . No narrative on file    Family History   Problem Relation Age of Onset  . Asthma Mother   . Hyperlipidemia Mother   . Hypertension Mother   . Stroke Father   . Heart attack Father   . Prostate cancer Father   . Deep vein thrombosis Father   . Cancer Father   . Diabetes Father   . Hyperlipidemia Father   . Hypertension Father   . Other Father     varicose veins  . Heart disease Father     before age 3  . Other Sister     varicose veins    Current Facility-Administered Medications  Medication Dose Route Frequency Provider Last Rate Last Dose  . bupivacaine 0.25 % ON-Q pump DUAL CATH 300 mL  300 mL Other Continuous [name], MD      . ceFAZolin (ANCEF) 3 g in dextrose 5 % 50 mL IVPB  3 g Intravenous On Call to [place], MD         Allergies  Allergen Reactions  . Feraheme [Ferumoxytol] Itching    Severe itching.    ROS: Constitutional:  No fevers, chills, sweats.  Weight stable Eyes:  No vision changes, No discharge HENT:  No sore throats, nasal drainage Lymph: No neck swelling, No bruising easily Pulmonary:  No cough, productive sputum CV: No orthopnea. No exertional chest/neck/shoulder/arm pain. GI: No personal nor family history of GI/colon cancer, inflammatory bowel disease, irritable bowel syndrome, allergy such as Celiac Sprue, dietary/dairy problems, colitis, ulcers nor gastritis.  No recent sick contacts/gastroenteritis.  No travel outside the country.  No changes in diet. Renal: No UTIs, No hematuria Genital:  No drainage, bleeding, masses Musculoskeletal: No severe joint pain.  Good ROM major joints Skin:  No sores or lesions.  No rashes Heme/Lymph:  No easy bleeding.  No swollen lymph nodes Neuro: No focal weakness/numbness.  No seizures Psych: No suicidal ideation.  No hallucinations  BP 127/73  Pulse 83  Temp(Src) 98.1 F (36.7 C) (Oral)  Resp 18  Ht 5\' 11"  (1.803 m)  Wt 292 lb 7 oz (132.649 kg)  BMI 40.80 kg/m2  SpO2 97%  Physical Exam: General: Pt  awake/alert/oriented x4 in no major acute distress Eyes: PERRL, normal EOM. Sclera nonicteric Neuro: CN II-XII intact w/o focal sensory/motor deficits. Lymph: No head/neck/groin lymphadenopathy Psych:  No delerium/psychosis/paranoia HENT: Normocephalic, Mucus membranes moist.  No thrush Neck: Supple, No tracheal deviation Chest: No pain.  Good respiratory excursion. CV:  Pulses intact.  Regular rhythm Abdomen: Soft, Nondistended.  Nontender.  [place] x2 periumbilical Ext:  SCDs BLE.  No significant edema.  No cyanosis Skin: No petechiae / purpurea.  No major sores Musculoskeletal: No severe joint pain.  Good ROM major joints   Results:   Labs: Results for orders placed during the hospital encounter of 03/15/14 (from the  past 48 hour(s))  GLUCOSE, CAPILLARY     Status: Abnormal   Collection Time    03/15/14  6:42 AM      Result Value Ref Range   Glucose-Capillary 148 (*) 70 - 99 mg/dL    Imaging / Studies: No results found.  Medications / Allergies: per chart  Antibiotics: Anti-infectives   Start     Dose/Rate Route Frequency Ordered Stop   03/15/14 0600  ceFAZolin (ANCEF) 3 g in dextrose 5 % 50 mL IVPB     3 g 160 mL/hr over 30 Minutes Intravenous On call to O.R. 03/14/14 1311 03/16/14 0559      Assessment  Jonathon Bailey  58 y.o. male  Day of Surgery  Procedure(s): LAPAROSCOPIC INSERTION CONTINUOUS AMBULATORY PERITONEAL DIALYSIS  (CAPD) CATHETER, LAPARASCOPIC INCISIONAL HERNIA REPAIR AND POSSIBLE LYSIS OF ADHEASIONS  Problem List:  Active Problems:   * No active hospital problems. *   Dialysis-dependent renal failure patient wishing to consider peritoneal dialysis. Somewhat reasonable candidate.   Persistent ventral hernia recurrent in the setting of diastases recti   Plan:  Lap [place] repair & CAPD placement:  The anatomy & physiology of peritoneum was discussed.  Natural history risks without surgery of worsening renal failure was discussed.   I feel the  risks of no intervention will lead to serious problems that outweigh the operative risks; therefore, I recommended placement of a peritoneal dialysis catheter.  I explained laparoscopic techniques with possible need for an open approach.    Risks such as bleeding, infection, abscess, injury to other organs, catheter occlusion or malpositioning, reoperation to remove/reposition the catheter, heart attack, death, and other risks were discussed.   I noted a good likelihood this will help address the problem.  Possibility that this will not be enough to compensate for the renal failure & need for further treatment such as hemodialysis was explained.  Goals of post-operative recovery were discussed as well.  We will work to minimize complications.   The patient is/will be getting training on catheter use by dialysis nursing before and after surgery.  I stressed the importance of meticulous care & sterile technique to prevent catheter problems.  Questions were answered.  The patient expresses understanding & wishes to proceed with surgery.   The anatomy & physiology of the abdominal wall was discussed.  The pathophysiology of hernias was discussed.  Natural history risks without surgery including progeressive enlargement, pain, incarceration & strangulation was discussed.   Contributors to complications such as smoking, obesity, diabetes, prior surgery, etc were discussed.   I feel the risks of no intervention will lead to serious problems that outweigh the operative risks; therefore, I recommended surgery to reduce and repair the hernia.  I explained laparoscopic techniques with possible need for an open approach.  I noted the probable use of mesh to patch and/or buttress the hernia repair  Risks such as bleeding, infection, abscess, need for further treatment, heart attack, death, and other risks were discussed.  I noted a good likelihood this will help address the problem.   Goals of post-operative recovery  were discussed as well.  Possibility that this will not correct all symptoms was explained.  I stressed the importance of low-impact activity, aggressive pain control, avoiding constipation, & not pushing through pain to minimize risk of post-operative chronic pain or injury. Possibility of reherniation especially with smoking, obesity, diabetes, immunosuppression, and other health conditions was discussed.  We will work to minimize complications.     An  educational handout further explaining the pathology & treatment options was given as well.  Questions were answered.  The patient expresses understanding & wishes to proceed with surgery.     -VTE prophylaxis- SCDs, etc  -mobilize as tolerated to help recovery    [name], M.D., F.A.C.S. Gastrointestinal and Minimally Invasive Surgery [facility] [address], [place], [place] [address] [phone] Main / Paging   03/15/2014  Note: This dictation was prepared with voice recognition software technology. In this process, transcriptional errors may occur.  Attempts are made to proofread & provide accurate documentation.  Any errors are unintentional.

## 2014-03-15 NOTE — Anesthesia Preprocedure Evaluation (Addendum)
Anesthesia Evaluation  Patient identified by MRN, date of birth, ID band Patient awake    Reviewed: Allergy & Precautions, H&P , NPO status , Patient's Chart, lab work & pertinent test results, reviewed documented beta blocker date and time   History of Anesthesia Complications Negative for: history of anesthetic complications  Airway Mallampati: II TM Distance: >3 FB Neck ROM: Full    Dental  (+) Teeth Intact, Dental Advisory Given   Pulmonary sleep apnea and Continuous Positive Airway Pressure Ventilation , COPD COPD inhaler, former smoker,  breath sounds clear to auscultation        Cardiovascular hypertension, Pt. on medications and Pt. on home beta blockers + CAD, + Past MI and + Cardiac Stents Rhythm:Regular Rate:Normal  '14 ECHO: EF 99991111, grade 2 diastolic dysfunction, valves OK Pt reports normal stress test at [facility] earlier this month   Neuro/Psych Anxiety CVA (R weakness), Residual Symptoms    GI/Hepatic negative GI ROS, Neg liver ROS,   Endo/Other  diabetes (glu 141), Insulin DependentMorbid obesity  Renal/GU ESRF and DialysisRenal disease (K+ 4.1)     Musculoskeletal   Abdominal (+) + obese,   Peds  Hematology   Anesthesia Other Findings   Reproductive/Obstetrics                       Anesthesia Physical Anesthesia Plan  ASA: III  Anesthesia Plan: General   Post-op Pain Management:    Induction: Intravenous  Airway Management Planned: Oral ETT  Additional Equipment:   Intra-op Plan:   Post-operative Plan: Extubation in OR  Informed Consent: I have reviewed the patients History and Physical, chart, labs and discussed the procedure including the risks, benefits and alternatives for the proposed anesthesia with the patient or authorized representative who has indicated his/her understanding and acceptance.   Dental advisory given  Plan Discussed with: CRNA and  Surgeon  Anesthesia Plan Comments: (Plan routine monitors, GETA)        Anesthesia Quick Evaluation

## 2014-03-15 NOTE — Consult Note (Signed)
Jonathon Bailey Renal Consultation Note  Indication for Consultation:  Management of ESRD/hemodialysis; anemia, hypertension/volume and secondary hyperparathyroidism  HPI: Jonathon Bailey is a 58 y.o. male with a history of diabetes, hypertension, CVA, CAD s/p stent placement, and ESRD on hemodialysis, currently at the [facility], but converting to peritoneal dialysis, who presented to [facility] today for scheduled laparoscopic placement of a PD catheter and incisional ventral hernia repair with lysis of adhesions by Dr. Michael Bailey.  He started dialysis in 02/2013 and tolerates his treatments well, usually reaching his dry weight goal, but runs 4 1/2 hours.  Currently he is stable, but somnolent after surgery.  Dialysis Orders:  TTS @ [facility] 4:30      131 kg      2K/2.25Ca        400/800     Heparin 7000 U, then 4000 U mid-HD        R IJ catheter, AVF @ LFA    No Hectorol             Aranesp 25 mcg & Venofer 50 mg on Thurs.  Past Medical History  Diagnosis Date  . Diabetes mellitus   . Hyperlipidemia   . Hypertension   . Myocardial infarction     status post MI x2 and 3 stents placed in 2003  . Stroke ~ 2007; ~1987    "weak on right side; messed w/right side of brain; cry all the time"  . Sleep apnea     "sleep w/CPAP sometimes"  . Arthritis   . Anxiety   . Depression   . Chronic systolic heart failure Q000111Q    ECHO Feb 2013 showed LVEF low normal at 50-55%, +hypokinetic anterolateral wall and inferolateral wall.    . CKD (chronic kidney disease) stage 4, GFR 15-29 ml/min 08/30/2009    Progressive renal failure since 2008, creatinine 1.2 in 2008 up to 3.5 in 2012 and 3.2-5.0 in 2013. All UA's 2011-13 showed >300 protein on dipstick. Work-up in May 2011 showed negative Urine IFE and SPEP, ultrasound showed 12-13 cm kidneys with increased echogenicity and UPC ratio was 1.5 gm proteinuria.  Hgb A1C's from 2011 to 2013 were all between 9-11.  Patient saw  Jonathon Bailey (vasc surgery) for HD access in Aug 2013 > vein mapping was done and Jonathon Bailey felt the left arm (pt is R handed) was suitable for L arm Cimino radiocephalic fistula. Patient said he wasn't ready to consider doing dialysis and declined the surgery.     . Obesity   . Macular edema   . Diabetic retinopathy    Past Surgical History  Procedure Laterality Date  . Coronary stent placement    . Umbilical hernia repair      umbilical  . Coronary angioplasty with stent placement  ~ 2002    "3"  . Av fistula placement  10/17/2012    Procedure: ARTERIOVENOUS (AV) FISTULA CREATION;  Surgeon: Jonathon Misty, MD;  Location: [facility] OR;  Service: Vascular;  Laterality: Left;   Family History  Problem Relation Age of Onset  . Asthma Mother   . Hyperlipidemia Mother   . Hypertension Mother   . Stroke Father   . Heart attack Father   . Prostate cancer Father   . Deep vein thrombosis Father   . Cancer Father   . Diabetes Father   . Hyperlipidemia Father   . Hypertension Father   . Other Father     varicose veins  .  Heart disease Father     before age 97  . Other Sister     varicose veins   Social History He quit smoking cigarettes about 27 years ago and denies any history of alcohol or illicit drug use.  He previously worked with heating and refrigeration and is currently married with two grown children.  Allergies  Allergen Reactions  . Feraheme [Ferumoxytol] Itching    Severe itching.   Prior to Admission medications   Medication Sig Start Date End Date Taking? Authorizing Provider  albuterol (PROVENTIL HFA;VENTOLIN HFA) 108 (90 BASE) MCG/ACT inhaler Inhale 2 puffs into the lungs every 4 (four) hours as needed for wheezing. For wheezing   Yes Historical Provider, MD  atorvastatin (LIPITOR) 80 MG tablet Take 40 mg by mouth daily.   Yes Historical Provider, MD  calcitRIOL (ROCALTROL) 0.25 MCG capsule Take 0.25-0.5 mcg by mouth daily. Alternates takes 1 tablet and 2 capsules the next  day   Yes Historical Provider, MD  carvedilol (COREG) 12.5 MG tablet Take 37.5 mg by mouth 2 (two) times daily with a meal.    Yes Historical Provider, MD  clopidogrel (PLAVIX) 75 MG tablet Take 75 mg by mouth daily.   Yes Historical Provider, MD  hydrALAZINE (APRESOLINE) 50 MG tablet Take 50 mg by mouth 3 (three) times daily.   Yes Historical Provider, MD  insulin detemir (LEVEMIR) 100 UNIT/ML injection Inject 75 Units into the skin 2 (two) times daily.   Yes Historical Provider, MD  Insulin Pen Needle (B-D ULTRAFINE III SHORT PEN) 31G X 8 MM MISC Use with insulin administration. 08/12/13  Yes [name], DO  sevelamer carbonate (RENVELA) 800 MG tablet Take 800 mg by mouth 3 (three) times daily with meals.    Yes Historical Provider, MD  tiotropium (SPIRIVA) 18 MCG inhalation capsule Place 18 mcg into inhaler and inhale daily as needed.    Yes Historical Provider, MD  Artificial Tear Solution (SOOTHE HYDRATION OP) Place 1 drop into both eyes 3 (three) times daily.    Historical Provider, MD  oxyCODONE (OXY IR/ROXICODONE) 5 MG immediate release tablet Take 1-2 tablets (5-10 mg total) by mouth every 4 (four) hours as needed for moderate pain, severe pain or breakthrough pain. 03/15/14   Jonathon Hector, MD   Labs:  Results for orders placed during the hospital encounter of 03/15/14 (from the past 48 hour(s))  GLUCOSE, CAPILLARY     Status: Abnormal   Collection Time    03/15/14  6:42 AM      Result Value Ref Range   Glucose-Capillary 148 (*) 70 - 99 mg/dL  POCT I-STAT 4, (NA,K, GLUC, HGB,HCT)     Status: Abnormal   Collection Time    03/15/14  7:20 AM      Result Value Ref Range   Sodium 142  137 - 147 mEq/L   Potassium 4.1  3.7 - 5.3 mEq/L   Glucose, Bld 141 (*) 70 - 99 mg/dL   HCT 30.0 (*) 39.0 - 52.0 %   Hemoglobin 10.2 (*) 13.0 - 17.0 g/dL  GLUCOSE, CAPILLARY     Status: Abnormal   Collection Time    03/15/14 11:09 AM      Result Value Ref Range   Glucose-Capillary 118 (*) 70 - 99  mg/dL   Constitutional: negative for chills, fatigue, fevers and sweats Ears, nose, mouth, throat, and face: negative for earaches, hoarseness, nasal congestion and sore throat Respiratory: negative for cough, dyspnea on exertion, hemoptysis and sputum  Cardiovascular: negative for chest pain, chest pressure/discomfort, dyspnea, orthopnea and palpitations Gastrointestinal: positive for abdominal pain sec to surgery, negative for change in bowel habits, nausea and vomiting Genitourinary:negative, oliguric Musculoskeletal:negative for arthralgias, back pain, myalgias and neck pain Neurological: negative for dizziness, headaches, paresthesia and speech problems  Physical Exam: Filed Vitals:   03/15/14 1238  BP: 132/92  Pulse: 78  Temp: 97.1 F (36.2 C)  Resp: 19     General appearance: somnolent, but able to arouse, cooperative, no distress Head: Normocephalic, without obvious abnormality, atraumatic Neck: no adenopathy, no carotid bruit, no JVD and supple, symmetrical, trachea midline Resp: clear to auscultation bilaterally Cardio: regular rate and rhythm, S1, S2 normal, no murmur, click, rub or gallop GI: dressing over ventral hernia repair and PD catheter Extremities: extremities normal, atraumatic, no cyanosis or edema Neurologic: Grossly normal Dialysis Access: R IJ catheter, AVF @ LUA without bruit   Assessment/Plan: 1. Laparoscopic placement of PD catheter & ventral hernia repair - today per Dr. Johney Maine, stable s/p surgery. 2. ESRD - HD on TTS @ [place], K 4.1.  HD tomorrow. 3. Dialysis access - appt with Dr. Augustin Coupe 6/24 to evaluate poorly functioning catheter with limited BFR sec to arterial pressures, evaluate tomorrow during HD. 4. Hypertension/volume - BP 173/87 on Carvedilol 37.5 mg bid, Hydralazine 50 mg tid; wt 132.6 kg, reached UF goal @ last HD, CXR negative. 5. Anemia - Hgb 10.2 on Aranesp 25 mcg & Fe on Thurs. 6. Metabolic bone disease - Ca 9.9, P 5.1, iPTH 336; Renvela  with meals. 7. Nutrition - Alb 3.8, advancing diet as tolerated. 8. DM - Insulin per primary. 9. Hx CAD - s/p stents, on Plavix, Coreg.  Vonnie Spagnolo 03/15/2014, 12:57 PM   Attending Nephrologist: Edrick Oh, MD

## 2014-03-15 NOTE — Op Note (Addendum)
03/15/2014  11:02 AM  PATIENT:  Jonathon Bailey  58 y.o. male  Patient Care Team: [name], DO as PCP - General (Pediatrics) [name], MD as Consulting Physician (Nephrology) [name], MD as Consulting Physician (Nephrology)  PRE-OPERATIVE DIAGNOSIS:  end stage renal disease, dialysis dependant, incisional hernia  POST-OPERATIVE DIAGNOSIS:  end stage renal disease, dialysis dependant, incisional hernia  PROCEDURE:  Procedure(s): LAPAROSCOPIC LYSIS OF ADHESIONS X 15 MIN LAPARASCOPIC INCISIONAL HERNIA  REPAIRS x5 WITH MESH LAPAROSCOPIC OMENTOPEXY  LAPAROSCOPIC RESCTION OF EPIPLOIC APPENDAGES (SIGMOID COLON) LAPAROSCOPIC INSERTION CONTINUOUS AMBULATORY PERITONEAL DIALYSIS CATHETER  SURGEON:  Surgeon(s): [name], MD  ASSISTANT: RN   ANESTHESIA:   local and general  EBL:  Total I/O In: 700 [I.V.:700] Out: -   Delay start of Pharmacological VTE agent (>24hrs) due to surgical blood loss or risk of bleeding:  no  DRAINS: CAPD catheter (LUQ)   SPECIMEN:  No Specimen  DISPOSITION OF SPECIMEN:  N/A  COUNTS:  YES  PLAN OF CARE: Admit for overnight observation  PATIENT DISPOSITION:  PACU - hemodynamically stable.  INDICATION: Pleasant morbidly obese male with recurrent periumbilical ventral incisional hernias incarcerated with omentum status post prior repairs.  Dialysis dependent.  Request for peritoneal dialysis.  I recommended laparoscopic exploration with repair of hernia peritoneal dialysis catheter placement.  The anatomy & physiology of peritoneum was discussed.  Natural history risks without surgery of worsening renal failure was discussed.   I feel the risks of no intervention will lead to serious problems that outweigh the operative risks; therefore, I recommended placement of a peritoneal dialysis catheter.  I explained laparoscopic techniques with possible need for an open approach.  I recommended probable mesh underlay repair of  recurrent incisional hernias.  Probable omentopexy.  Possible resection of redundant epiploic appendages as well.   Risks such as bleeding, infection, abscess, injury to other organs, catheter occlusion or malpositioning, reoperation to remove/reposition the catheter, heart attack, death, and other risks were discussed.   I noted a good likelihood this will help address the problem.  Possibility that this will not be enough to compensate for the renal failure & need for further treatment such as hemodialysis was explained.  Goals of post-operative recovery were discussed as well.  We will work to minimize complications.   The patient is/will be getting training on catheter use by dialysis nursing before and after surgery.  I stressed the importance of meticulous care & sterile technique to prevent catheter problems.  Questions were answered.  The patient expresses understanding & wishes to proceed with surgery.  OR FINDINGS:   It is a long tunneled CAPD peritoneal dialysis catheter ([employer] [id number] curl cath with titanium extender) .  The curl tip of the catheter rests in the deep pelvis.   Entry into the peritoneum it is in the left suprapubic region just above the dome of the bladder.  Deepest cuff in the periumbilical paramedian rectus muscle (lateral due to diastasis & periumbilical mesh) just deep to the fascia location.  The exit site of the catheter on the skin is in the left upper abdomen subcostal region, 7 cm inferior to costal ridge, lateral clavicular line.  Blue adapter CAPD extension tubing attached.  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  Incarcerated with omentum.  Type of repair - Laparoscopic underlay repair   Name of mesh - Bard Ventralight dual sided (polypropylene / Seprafilm)  Size  of mesh - Length 23 cm, Width 17 cm  Mesh overlap - 5-7 cm  Placement of mesh - Intraperitoneal underlay repair  DESCRIPTION:    Informed consent was confirmed.  The patient underwent general anaesthesia without difficulty.  The patient was positioned appropriately.  VTE prevention in place.  The patient's abdomen was clipped, prepped, & draped in a sterile fashion.  Surgical timeout confirmed our plan.  The patient was positioned in reverse Trendelenburg.  Abdominal entry was gained using optical entry technique in the right upper abdomen.  Entry was clean.  I induced carbon dioxide insufflation.  Camera inspection revealed no injury.  Extra ports were carefully placed under direct laparoscopic visualization.   I could see A large swath of greater omentum adhered to the central anterior abdominal wall.  I did laparoscopic lysis of adhesions to expose the entire anterior abdominal wall.  I primarily used and focused cold scissors.    Patient had a prior mesh umbilical patch as well.  It was markedly shrunken and had pulled away from one of the hernias.  I partially excised as well as omentum and removed it at the right upper quadrant port site.  Found very long epiploic appendages on the sigmoid colon that were reaching down the pelvis.  I excised those as well.  I made sure hemostasis was good.  And a reducing some omentum out of 3cm and 2 cm paraumbilical Swiss cheese hernia defects.  Many small 5-10 mm as well.  11 x 7 cm total region.  I mapped out the region using a needle passer.   To ensure that I would have at least 5 cm radial coverage outside of the hernia defect, I chose a 23x17 cm dual sided mesh.  I placed #1 Prolene stitches around its edge about every 5 cm = 14 total.  I rolled the mesh & placed into the peritoneal cavity.  I unrolled  the mesh and positioned it appropriately.  I secured the mesh to cover up the hernia defect using a laparoscopic suture passer to pass the tails of the Prolene through the abdominal wall & tagged them with clamps.  I started out in four corners to make sure I had the mesh centered over  the hernia defect appropriately, and then proceeded to work in quadrants.  We evacuated CO2 & desufflated the abdomen.  I tied the fascial stitches down.  I reinsufflated the abdomen.  The mesh provided at least 5-10 cm circumferential coverage around the entire region of hernia defects.   I tacked the edges & central part of the mesh to the peritoneum/posterior rectus fascia with SecureStrap absorbable tacks.   Hemostasis was excellent.  I closed the fascia port site on the RUQ 10 mm port using a 0 Vicryl stitch using laparoscopic intracorporeal suture passer. I mobilized the greater omentum it into the upper abdomen.  I performed omentopexy with 0 vicryl interrupted stitches to the omental edge in bilateral upper quadrants x2 using a laparoscopic Endoclose suture passer.  This kept the omentum from reaching into the infraumbilical abdomen.  I proceeded with placement of the peritoneal dialysis catheter.   I measured the catheter by surface anatomy such that the curl rested just distal to the pubic rim on the surface, marking where the deep cuff lied on the periumbilical skin.  He had some diastases.  I did up making the incision a little more lateral to stay away from the central mesh.  I made an incision at that  mark & tunnelled a 15mm long 4mm Applied dilating port obliquely through the abdominal wall from that periumbilical location through the abdominal wall inferiorly, splitting the left infraumbililcal rectus muscle, tunnelling into the preperitoneal space, and exiting into the peritoneum just cephalad to the dome of the bladder.  I placed the CAPD catheter through that port with the help of a stylet.  The curl of the CAPD catheter rested down in the cul de sac of the upper pelvis.   It flushed & aspirated 288mL heparinized saline well.  The deep cuff rested just under the anterior rectus fascia in the rectus muscle just slightly infraumbilical.  The Port was removed to allow the external end to exit  the skin of the port site.  I placed a purse-string stitch around the fascia at the exit site catheter using a Vicryl stitch on a UR-6 needle to provide a tight seal at the fascia.  I measured & cut to appropriate length the double-cuffed extension tubing so that the upper curve was 2cm below the costal ridge at the medial clavicular line.  I connected the upper double-cuff extra long catheter extention tubing to the tunnelled curl catheter using the MED[employer] titanium extender connector.    I tunneled the extended catheter using a long vascular tunneler with a blunt bullet-screw-on tip, entering a subcostal wound to the periumbilical wound.  I tunnelled the catheter from the paramedian 62mm port exit site though the abdominal wall out the subcostal wound.  Therefore, the titanium connector was buried in the left upper quadrant SQ of the abdominal wall by that tunneling.  I left the proximal superficial cuff just inferior to the subcostal wound.  I attached a Faller stylet (MEDIONICS FS-402, 50 degree bend) spike-tip tunneler to the end of the extended tubing.  I secured it to the tubing with a prolene tie.  I used the Faller stylet to tunnel the remaining tubing inferiorly and laterally to exit the skin in the left upper quadrant at the lateral clavicular line, 7cm inferior & 7cm lateral to the subcostal wound.  The most distal cuff rests superior to the final exit site in the SQ.  I removed the Faller stylet.  No stitches were placed at the exit site.       Hemostasis was excellent.  The catheter flushed and aspirated easily with heparinized saline into the CAPD catheter.  I attached to the [employer]ION[employer] titanium hep lock adapter to the tubing.  I then attached the peritoneal dialysis catheter adapter extension blue cap tubing to that as well.  I removed the ports.  I closed the skin using 4-0 monocryl stitch.  Sterile dressings were applied. The patient was extubated & arrived in the PACU in stable  condition..  I had discussed postoperative care with the patient in the holding area. I am about to locate the patient's family and discuss operative findings and postoperative goals / instructions.  Instructions and contact information are written in the chart as well.  I called Dr. Moshe Cipro with [facility] for inpatient nephrology consultation given the overnight stay and need for dialysis tomorrow.  The patient will require close followup of the CAPD peritoneal dialysis catheter through the peritoneal dialysis [facility] within 24 hours.  [name], M.D., F.A.C.S. Gastrointestinal and Minimally Invasive Surgery [facility] [address], [place], [place] [address] [phone] Main / Paging    03/15/2014

## 2014-03-15 NOTE — Anesthesia Postprocedure Evaluation (Signed)
  Anesthesia Post-op Note  Patient: Jonathon Bailey  Procedure(s) Performed: Procedure(s): LAPAROSCOPIC INSERTION CONTINUOUS AMBULATORY PERITONEAL DIALYSIS CATHETER, LAPARASCOPIC INCISIONAL HERNIA  REPAIR  WITH MESH, OMENTOPEXY AND LYSIS OF ADHESIONS (N/A)  Patient Location: PACU  Anesthesia Type:General  Level of Consciousness: awake, alert  and oriented  Airway and Oxygen Therapy: Patient Spontanous Breathing and Patient connected to nasal cannula oxygen  Post-op Pain: mild  Post-op Assessment: Post-op Vital signs reviewed, Patient's Cardiovascular Status Stable, Respiratory Function Stable, Patent Airway and Pain level controlled  Post-op Vital Signs: stable  Last Vitals:  Filed Vitals:   03/15/14 1238  BP: 132/92  Pulse: 78  Temp: 36.2 C  Resp: 19    Complications: No apparent anesthesia complications

## 2014-03-15 NOTE — Consult Note (Signed)
I have seen and examined this patient and agree with the plan of care. No complaints . Will plan dialysis in AM  [facility] W 03/15/2014, 2:36 PM

## 2014-03-15 NOTE — Transfer of Care (Signed)
Immediate Anesthesia Transfer of Care Note  Patient: Jonathon Bailey  Procedure(s) Performed: Procedure(s): LAPAROSCOPIC INSERTION CONTINUOUS AMBULATORY PERITONEAL DIALYSIS CATHETER, LAPARASCOPIC INCISIONAL HERNIA  REPAIR  WITH MESH, OMENTOPEXY AND LYSIS OF ADHESIONS (N/A)  Patient Location: PACU  Anesthesia Type:General  Level of Consciousness: awake, alert  and oriented  Airway & Oxygen Therapy: Patient Spontanous Breathing and Patient connected to nasal cannula oxygen  Post-op Assessment: Report given to PACU RN and Post -op Vital signs reviewed and stable  Post vital signs: Reviewed and stable  Complications: No apparent anesthesia complications

## 2014-03-16 ENCOUNTER — Encounter (HOSPITAL_COMMUNITY): Payer: Self-pay | Admitting: Surgery

## 2014-03-16 DIAGNOSIS — K430 Incisional hernia with obstruction, without gangrene: Secondary | ICD-10-CM | POA: Diagnosis not present

## 2014-03-16 LAB — GLUCOSE, CAPILLARY
Glucose-Capillary: 126 mg/dL — ABNORMAL HIGH (ref 70–99)
Glucose-Capillary: 138 mg/dL — ABNORMAL HIGH (ref 70–99)
Glucose-Capillary: 141 mg/dL — ABNORMAL HIGH (ref 70–99)
Glucose-Capillary: 185 mg/dL — ABNORMAL HIGH (ref 70–99)
Glucose-Capillary: 74 mg/dL (ref 70–99)

## 2014-03-16 LAB — RENAL FUNCTION PANEL
ALBUMIN: 3.5 g/dL (ref 3.5–5.2)
BUN: 67 mg/dL — AB (ref 6–23)
CHLORIDE: 99 meq/L (ref 96–112)
CO2: 24 mEq/L (ref 19–32)
CREATININE: 10.8 mg/dL — AB (ref 0.50–1.35)
Calcium: 8.8 mg/dL (ref 8.4–10.5)
GFR calc Af Amer: 5 mL/min — ABNORMAL LOW (ref >90)
GFR calc non Af Amer: 5 mL/min — ABNORMAL LOW (ref >90)
Glucose, Bld: 192 mg/dL — ABNORMAL HIGH (ref 70–99)
Phosphorus: 7.9 mg/dL — ABNORMAL HIGH (ref 2.3–4.6)
Potassium: 4.8 mEq/L (ref 3.7–5.3)
Sodium: 140 mEq/L (ref 137–147)

## 2014-03-16 LAB — HEPATITIS B SURFACE ANTIGEN: HEP B S AG: NEGATIVE

## 2014-03-16 LAB — CBC
HCT: 28 % — ABNORMAL LOW (ref 39.0–52.0)
HEMOGLOBIN: 8.6 g/dL — AB (ref 13.0–17.0)
MCH: 28.9 pg (ref 26.0–34.0)
MCHC: 30.7 g/dL (ref 30.0–36.0)
MCV: 94 fL (ref 78.0–100.0)
Platelets: 214 10*3/uL (ref 150–400)
RBC: 2.98 MIL/uL — ABNORMAL LOW (ref 4.22–5.81)
RDW: 13.9 % (ref 11.5–15.5)
WBC: 8.1 10*3/uL (ref 4.0–10.5)

## 2014-03-16 LAB — MRSA PCR SCREENING: MRSA BY PCR: NEGATIVE

## 2014-03-16 MED ORDER — INSULIN DETEMIR 100 UNIT/ML ~~LOC~~ SOLN
38.0000 [IU] | Freq: Two times a day (BID) | SUBCUTANEOUS | Status: DC
  Administered 2014-03-17 – 2014-03-19 (×3): 38 [IU] via SUBCUTANEOUS
  Filled 2014-03-16 (×8): qty 0.38

## 2014-03-16 MED ORDER — INSULIN ASPART 100 UNIT/ML ~~LOC~~ SOLN
0.0000 [IU] | Freq: Three times a day (TID) | SUBCUTANEOUS | Status: DC
  Administered 2014-03-16: 2 [IU] via SUBCUTANEOUS
  Administered 2014-03-16: 3 [IU] via SUBCUTANEOUS
  Administered 2014-03-16 – 2014-03-17 (×2): 2 [IU] via SUBCUTANEOUS
  Administered 2014-03-18: 5 [IU] via SUBCUTANEOUS
  Administered 2014-03-19 (×2): 3 [IU] via SUBCUTANEOUS

## 2014-03-16 MED ORDER — INSULIN DETEMIR 100 UNIT/ML ~~LOC~~ SOLN
35.0000 [IU] | Freq: Once | SUBCUTANEOUS | Status: AC
  Administered 2014-03-16: 35 [IU] via SUBCUTANEOUS
  Filled 2014-03-16: qty 0.35

## 2014-03-16 MED ORDER — SODIUM CHLORIDE 0.9 % IV SOLN
1500.0000 mg | Freq: Once | INTRAVENOUS | Status: AC
  Administered 2014-03-16: 1500 mg via INTRAVENOUS
  Filled 2014-03-16: qty 1500

## 2014-03-16 MED ORDER — BISACODYL 10 MG RE SUPP
10.0000 mg | Freq: Every day | RECTAL | Status: DC
  Administered 2014-03-16 – 2014-03-19 (×2): 10 mg via RECTAL
  Filled 2014-03-16: qty 1

## 2014-03-16 MED ORDER — INSULIN DETEMIR 100 UNIT/ML ~~LOC~~ SOLN: 37.0000 [IU] | Freq: Once | SUBCUTANEOUS | Status: DC

## 2014-03-16 MED ORDER — POLYETHYLENE GLYCOL 3350 17 G PO PACK
17.0000 g | PACK | Freq: Two times a day (BID) | ORAL | Status: DC
  Administered 2014-03-16 – 2014-03-17 (×4): 17 g via ORAL
  Filled 2014-03-16 (×6): qty 1

## 2014-03-16 MED ORDER — INSULIN DETEMIR 100 UNIT/ML ~~LOC~~ SOLN
75.0000 [IU] | Freq: Two times a day (BID) | SUBCUTANEOUS | Status: DC
  Administered 2014-03-16 – 2014-03-17 (×3): 75 [IU] via SUBCUTANEOUS
  Filled 2014-03-16 (×8): qty 0.75

## 2014-03-16 MED ORDER — INSULIN DETEMIR 100 UNIT/ML ~~LOC~~ SOLN
37.5000 [IU] | Freq: Two times a day (BID) | SUBCUTANEOUS | Status: DC
  Filled 2014-03-16: qty 0.75

## 2014-03-16 NOTE — Progress Notes (Signed)
Patient's CBG was 74. Notified Dr. Barry Dienes and orders received to hold 75 units of Levemir  And administer one time dose of 35 units of Levemir

## 2014-03-16 NOTE — Procedures (Signed)
I have seen and examined this patient and agree with the plan of care  Patient seen on dialysis  BFR 300   WEBB,MARTIN W 03/16/2014, 2:26 PM

## 2014-03-16 NOTE — Progress Notes (Signed)
Utilization review completed.  

## 2014-03-16 NOTE — Progress Notes (Signed)
I have seen and examined this patient and agree with the plan of care. Plan dialysis today [facility] W 03/16/2014, 9:15 AM

## 2014-03-16 NOTE — Progress Notes (Signed)
[facility]  [address] [place], [place] 999-26-5244 Phone: [phone] FAX: [phone]    NIV LUISI [id number] [birth year]  CARE TEAM:  PCP: Thersa Salt, DO  Outpatient Care Team: Patient Care Team: [name], DO as PCP - General (Pediatrics) [name], MD as Consulting Physician (Nephrology) [name], MD as Consulting Physician (Nephrology)  Inpatient Treatment Team: Treatment Team: Attending Provider: [name], MD; Consulting Physician: Louis Meckel, MD; Technician: Army Chaco, NT; Registered Nurse: Donzetta Matters, RN; Registered Nurse: Jacklynn Ganong, RN; Technician: Lindajo Royal, NT   Subjective:  Sore Mild nausea last night - better this AM Staying in bed mostly  Objective:  Vital signs:  Filed Vitals:   03/15/14 1312 03/15/14 1750 03/16/14 0004 03/16/14 0643  BP: 173/87 169/99 160/87 156/72  Pulse: 80 86 97 89  Temp: 97.2 F (36.2 C)  98 F (36.7 C) 98.6 F (37 C)  TempSrc: Oral  Oral Oral  Resp: 19  18 17   Height:      Weight:      SpO2: 97%  93% 94%       Intake/Output   Yesterday:  06/22 0701 - 06/23 0700 In: 2142 [P.O.:1242; I.V.:900] Out: 0  This shift:     Bowel function:  Flatus: n  BM: n  Drain: clean CAPD catheter  Physical Exam:  General: Pt awake/alert/oriented x4 in no acute distress Eyes: PERRL, normal EOM.  Sclera clear.  No icterus Neuro: CN II-XII intact w/o focal sensory/motor deficits. Lymph: No head/neck/groin lymphadenopathy Psych:  No delerium/psychosis/paranoia HENT: Normocephalic, Mucus membranes moist.  No thrush Neck: Supple, No tracheal deviation Chest:  No chest wall pain w good excursion CV:  Pulses intact.  Regular rhythm MS: Normal AROM mjr joints.  No obvious deformity Abdomen: Soft.  Nondistended.  Mod tender at central incisions only.  No evidence of peritonitis.  No incarcerated hernias. Ext:  SCDs  BLE.  No mjr edema.  No cyanosis Skin: No petechiae / purpura   Problem List:   Principal Problem:   CAPD (continuous ambulatory peritoneal dialysis) status Active Problems:   Diabetes mellitus type 2 with complications   OBSTRUCTIVE SLEEP APNEA   ESSENTIAL HYPERTENSION   Chronic combined systolic and diastolic heart failure   ESRD (end stage renal disease) on dialysis   COPD (chronic obstructive pulmonary disease)   Morbid obesity   Recurrent ventral incisional hernia s/p lap reapir w mesh 03/15/2014   Assessment  Jonathon Bailey  58 y.o. male  1 Day Post-Op  Procedure(s): PROCEDURE: Procedure(s):  LAPAROSCOPIC LYSIS OF ADHESIONS X 15 MIN  LAPARASCOPIC INCISIONAL HERNIA REPAIR WITH MESH  LAPAROSCOPIC OMENTOPEXY  LAPAROSCOPIC RESCTION OF EPIPLOIC APPENDAGES  LAPAROSCOPIC INSERTION CONTINUOUS AMBULATORY PERITONEAL DIALYSIS CATHETER   Sore  Plan:  -adv diet gradually -bowel regimen -HD today -1/2 Levimir for now until eating better -VTE prophylaxis- SCDs, etc -mobilize as tolerated to help recovery  D/C patient from hospital when patient meets criteria (anticipate in 1-3 day(s)):  Tolerating oral intake well Ambulating in walkways Adequate pain control without IV medications Having flatus   [name], M.D., F.A.C.S. Gastrointestinal and Minimally Invasive Surgery [facility] [address], [place] [employer], [place] [address] [phone] Main / Paging   03/16/2014   Results:   Labs: Results for orders placed during the hospital encounter of 03/15/14 (from the past 48 hour(s))  GLUCOSE, CAPILLARY  Status: Abnormal   Collection Time    03/15/14  6:42 AM      Result Value Ref Range   Glucose-Capillary 148 (*) 70 - 99 mg/dL  POCT I-STAT 4, (NA,K, GLUC, HGB,HCT)     Status: Abnormal   Collection Time    03/15/14  7:20 AM      Result Value Ref Range   Sodium 142  137 - 147 mEq/L   Potassium 4.1  3.7 - 5.3 mEq/L    Glucose, Bld 141 (*) 70 - 99 mg/dL   HCT 30.0 (*) 39.0 - 52.0 %   Hemoglobin 10.2 (*) 13.0 - 17.0 g/dL  GLUCOSE, CAPILLARY     Status: Abnormal   Collection Time    03/15/14 11:09 AM      Result Value Ref Range   Glucose-Capillary 118 (*) 70 - 99 mg/dL  GLUCOSE, CAPILLARY     Status: None   Collection Time    03/15/14 11:59 PM      Result Value Ref Range   Glucose-Capillary 74  70 - 99 mg/dL   Comment 1 Notify RN     Comment 2 Documented in Chart      Imaging / Studies: Dg Chest 2 View  03/15/2014   CLINICAL DATA:  58 year old male preoperative study for hernia repair. Shortness of breath, hypertension, diabetes, dialysis. Initial encounter.  EXAM: CHEST  2 VIEW  COMPARISON:  02/20/2013.  FINDINGS: Right chest tunneled dual lumen dialysis type catheter now in place. Improved lung volumes from the comparison. No pneumothorax, pulmonary edema, pleural effusion or confluent pulmonary opacity. Stable cardiac size and mediastinal contours. Visualized tracheal air column is within normal limits. No acute osseous abnormality identified.  IMPRESSION: No acute cardiopulmonary abnormality.   Electronically Signed   By: Lars Pinks M.D.   On: 03/15/2014 08:06    Medications / Allergies: per chart  Antibiotics: Anti-infectives   Start     Dose/Rate Route Frequency Ordered Stop   03/15/14 1430  ceFAZolin (ANCEF) IVPB 1 g/50 mL premix     1 g 100 mL/hr over 30 Minutes Intravenous Every 6 hours 03/15/14 1311 03/16/14 0419   03/15/14 0600  ceFAZolin (ANCEF) 3 g in dextrose 5 % 50 mL IVPB     3 g 160 mL/hr over 30 Minutes Intravenous On call to O.R. 03/14/14 1311 03/15/14 0736       Note: This dictation was prepared with voice recognition software technology. In this process, transcriptional errors may occur.  Attempts are made to proofread & provide accurate documentation.  Any errors are unintentional.

## 2014-03-16 NOTE — Progress Notes (Signed)
Pt has refused cpap 2 nights in a row.  Rt spoke with him about it and he says he doesn't want ot wear it.  Rt removed machine from room, but left circuit and mask and explained we could bring it back if he changes his mind.  Rt will continue to monitor.

## 2014-03-16 NOTE — Progress Notes (Signed)
Subjective:  Sitting on edge of bed, abdominal soreness, currently no nausea, advancing appetite  Objective: Vital signs in last 24 hours: Temp:  [97.1 F (36.2 C)-98.6 F (37 C)] 98.6 F (37 C) (06/23 [id number]) Pulse Rate:  [73-97] 89 (06/23 0643) Resp:  [12-19] 17 (06/23 0643) BP: (132-180)/(72-99) 156/72 mmHg (06/23 0643) SpO2:  [93 %-99 %] 94 % (06/23 0643) Weight change:   Intake/Output from previous day: 06/22 0701 - 06/23 0700 In: 2142 [P.O.:1242; I.V.:900] Out: 0  Intake/Output this shift:   Lab Results:  Recent Labs  03/15/14 0720  HGB 10.2*  HCT 30.0*   BMET:  Recent Labs  03/15/14 0720  NA 142  K 4.1  GLUCOSE 141*   No results found for this basename: PTH,  in the last 72 hours Iron Studies: No results found for this basename: IRON, TIBC, TRANSFERRIN, FERRITIN,  in the last 72 hours  Studies/Results: Dg Chest 2 View  03/15/2014   CLINICAL DATA:  58 year old male preoperative study for hernia repair. Shortness of breath, hypertension, diabetes, dialysis. Initial encounter.  EXAM: CHEST  2 VIEW  COMPARISON:  02/20/2013.  FINDINGS: Right chest tunneled dual lumen dialysis type catheter now in place. Improved lung volumes from the comparison. No pneumothorax, pulmonary edema, pleural effusion or confluent pulmonary opacity. Stable cardiac size and mediastinal contours. Visualized tracheal air column is within normal limits. No acute osseous abnormality identified.  IMPRESSION: No acute cardiopulmonary abnormality.   Electronically Signed   By: Lars Pinks M.D.   On: 03/15/2014 08:06   EXAM: General appearance:  Alert, in mild distress Resp:  Poor respiratory effort, sec to pain, but clear Cardio: RRR without murmur or rub GI:  Soft, tender only over incision site, with dressing Extremities:  No edema Access:  R IJ catheter  Dialysis Orders: TTS @ [facility]  4:30 131 kg 2K/2.25Ca 400/800 Heparin 7000 U, then 4000 U mid-HD R IJ catheter, AVF @ [facility]  No Hectorol Aranesp  25 mcg & Venofer 50 mg on Thurs.  Assessment/Plan: 1. Laparoscopic placement of PD catheter & ventral hernia repair - 6/22 per Dr. Johney Maine, mobilizing as tolerated. 2. ESRD - HD on TTS @ [facility], K 4.1. HD pending today. 3. Dialysis access - appt with Dr. Augustin Coupe 6/24 to evaluate poorly functioning catheter with limited BFR sec to arterial pressures, evaluate today during HD. 4. Hypertension/volume - BP 156/72 on Carvedilol 37.5 mg bid, Hydralazine 50 mg tid; wt 132.6 kg, reached UF goal @ last HD, CXR negative. 5. Anemia - Hgb 10.2 on Aranesp 25 mcg & Fe on Thurs. 6. Metabolic bone disease - Ca 9.9, P 5.1, iPTH 336; Renvela with meals. 7. Nutrition - Alb 3.8, advancing diet as tolerated. 8. DM - Insulin per primary. 9. Hx CAD - s/p stents, on Plavix, Coreg.     LOS: 1 day   Ghazi Rumpf 03/16/2014,8:52 AM

## 2014-03-17 DIAGNOSIS — K430 Incisional hernia with obstruction, without gangrene: Secondary | ICD-10-CM | POA: Diagnosis not present

## 2014-03-17 LAB — GLUCOSE, CAPILLARY
GLUCOSE-CAPILLARY: 110 mg/dL — AB (ref 70–99)
GLUCOSE-CAPILLARY: 83 mg/dL (ref 70–99)
GLUCOSE-CAPILLARY: 120 mg/dL — AB (ref 70–99)
Glucose-Capillary: 149 mg/dL — ABNORMAL HIGH (ref 70–99)

## 2014-03-17 LAB — HEMOGLOBIN A1C
Hgb A1c MFr Bld: 7.4 % — ABNORMAL HIGH (ref <5.7)
Mean Plasma Glucose: 166 mg/dL — ABNORMAL HIGH (ref <117)

## 2014-03-17 MED ORDER — ACETAMINOPHEN 650 MG RE SUPP: 650.0000 mg | Freq: Four times a day (QID) | RECTAL | Status: DC | PRN

## 2014-03-17 MED ORDER — HYDROCODONE-ACETAMINOPHEN 7.5-325 MG PO TABS: 1.0000 | ORAL_TABLET | ORAL | Status: DC | PRN

## 2014-03-17 MED ORDER — HYDROMORPHONE HCL PF 1 MG/ML IJ SOLN
0.5000 mg | INTRAMUSCULAR | Status: DC | PRN
  Administered 2014-03-18: 1 mg via INTRAVENOUS

## 2014-03-17 MED ORDER — HYDROCODONE-ACETAMINOPHEN 7.5-325 MG PO TABS
1.0000 | ORAL_TABLET | ORAL | Status: DC | PRN
  Administered 2014-03-17 – 2014-03-19 (×7): 2 via ORAL
  Filled 2014-03-17 (×7): qty 2

## 2014-03-17 MED ORDER — ACETAMINOPHEN 325 MG PO TABS
325.0000 mg | ORAL_TABLET | Freq: Four times a day (QID) | ORAL | Status: DC | PRN
  Administered 2014-03-18: 650 mg via ORAL
  Filled 2014-03-17: qty 2

## 2014-03-17 NOTE — Discharge Summary (Addendum)
Physician Discharge Summary  Patient ID: Jonathon Bailey MRN: [id number] DOB/AGE: [birth year] 58 y.o.  Admit date: 03/15/2014 Discharge date: 03/19/2014  Admission Diagnoses:  Discharge Diagnoses:  Principal Problem:   Recurrent ventral incisional hernia s/p lap repair w mesh 03/15/2014 Active Problems:   Diabetes mellitus type 2 with complications   OBSTRUCTIVE SLEEP APNEA   ESSENTIAL HYPERTENSION   Chronic combined systolic and diastolic heart failure   ESRD (end stage renal disease) on dialysis   COPD (chronic obstructive pulmonary disease)   Morbid obesity   CAPD (continuous ambulatory peritoneal dialysis) status   Discharged Condition: stable  Hospital Course: The patient underwent the surgery above.  Postoperatively, the patient gradually mobilized in the hallways and advanced to a solid diet.  Pain and other symptoms  were treated aggressively.      By the time of discharge, the patient was walking well the hallways, eating food well, having flatus.  Pain was well-controlled on an oral regimen.  Nephrology followed the pt.  HD done.  HD catheter working well 1st run, so not changed.  2nd run more slow  - evaluation deferred to outpt setting.  PT/OT consults recommended & HH w walker setup given his soreness, morbid obesity, etc.      Based on meeting discharge criteria and continuing to recover, I felt it was safe for the patient to be discharged from the hospital with close followup.  Wife injured & needed urgent surgery.  Patient claimed teenage son & other family members could help take care of him at home.  He declined SNF consideration x2.  PT/OT felt able to be independent enough at home w family assistance.   Instructions were discussed in detail.  They are written as well.   Consults: nephrology  Significant Diagnostic Studies:   Results for orders placed during the hospital encounter of 03/15/14 (from the past 72 hour(s))  GLUCOSE, CAPILLARY     Status: Abnormal    Collection Time    03/15/14  6:42 AM      Result Value Ref Range   Glucose-Capillary 148 (*) 70 - 99 mg/dL  POCT I-STAT 4, (NA,K, GLUC, HGB,HCT)     Status: Abnormal   Collection Time    03/15/14  7:20 AM      Result Value Ref Range   Sodium 142  137 - 147 mEq/L   Potassium 4.1  3.7 - 5.3 mEq/L   Glucose, Bld 141 (*) 70 - 99 mg/dL   HCT 30.0 (*) 39.0 - 52.0 %   Hemoglobin 10.2 (*) 13.0 - 17.0 g/dL  GLUCOSE, CAPILLARY     Status: Abnormal   Collection Time    03/15/14 11:09 AM      Result Value Ref Range   Glucose-Capillary 118 (*) 70 - 99 mg/dL  GLUCOSE, CAPILLARY     Status: None   Collection Time    03/15/14 11:59 PM      Result Value Ref Range   Glucose-Capillary 74  70 - 99 mg/dL   Comment 1 Notify RN     Comment 2 Documented in Chart    GLUCOSE, CAPILLARY     Status: Abnormal   Collection Time    03/16/14  8:28 AM      Result Value Ref Range   Glucose-Capillary 126 (*) 70 - 99 mg/dL  MRSA PCR SCREENING     Status: None   Collection Time    03/16/14  8:57 AM      Result Value Ref  Range   MRSA by PCR NEGATIVE  NEGATIVE   Comment:            The GeneXpert MRSA Assay (FDA     approved for NASAL specimens     only), is one component of a     comprehensive MRSA colonization     surveillance program. It is not     intended to diagnose MRSA     infection nor to guide or     monitor treatment for     MRSA infections.  GLUCOSE, CAPILLARY     Status: Abnormal   Collection Time    03/16/14 12:22 PM      Result Value Ref Range   Glucose-Capillary 185 (*) 70 - 99 mg/dL  CBC     Status: Abnormal   Collection Time    03/16/14  2:47 PM      Result Value Ref Range   WBC 8.1  4.0 - 10.5 K/uL   RBC 2.98 (*) 4.22 - 5.81 MIL/uL   Hemoglobin 8.6 (*) 13.0 - 17.0 g/dL   HCT 28.0 (*) 39.0 - 52.0 %   MCV 94.0  78.0 - 100.0 fL   MCH 28.9  26.0 - 34.0 pg   MCHC 30.7  30.0 - 36.0 g/dL   RDW 13.9  11.5 - 15.5 %   Platelets 214  150 - 400 K/uL  RENAL FUNCTION PANEL     Status:  Abnormal   Collection Time    03/16/14  2:47 PM      Result Value Ref Range   Sodium 140  137 - 147 mEq/L   Potassium 4.8  3.7 - 5.3 mEq/L   Chloride 99  96 - 112 mEq/L   CO2 24  19 - 32 mEq/L   Glucose, Bld 192 (*) 70 - 99 mg/dL   BUN 67 (*) 6 - 23 mg/dL   Creatinine, Ser 10.80 (*) 0.50 - 1.35 mg/dL   Calcium 8.8  8.4 - 10.5 mg/dL   Phosphorus 7.9 (*) 2.3 - 4.6 mg/dL   Albumin 3.5  3.5 - 5.2 g/dL   GFR calc non Af Amer 5 (*) >90 mL/min   GFR calc Af Amer 5 (*) >90 mL/min   Comment: (NOTE)     The eGFR has been calculated using the CKD EPI equation.     This calculation has not been validated in all clinical situations.     eGFR's persistently <90 mL/min signify possible Chronic Kidney     Disease.  HEPATITIS B SURFACE ANTIGEN     Status: None   Collection Time    03/16/14  4:37 PM      Result Value Ref Range   Hepatitis B Surface Ag NEGATIVE  NEGATIVE   Comment: Performed at [place], CAPILLARY     Status: Abnormal   Collection Time    03/16/14  7:01 PM      Result Value Ref Range   Glucose-Capillary 141 (*) 70 - 99 mg/dL  GLUCOSE, CAPILLARY     Status: Abnormal   Collection Time    03/16/14 10:29 PM      Result Value Ref Range   Glucose-Capillary 138 (*) 70 - 99 mg/dL  GLUCOSE, CAPILLARY     Status: Abnormal   Collection Time    03/17/14  7:38 AM      Result Value Ref Range   Glucose-Capillary 110 (*) 70 - 99 mg/dL    Treatments: dialysis: Hemodialysis  surgery: POST-OPERATIVE DIAGNOSIS: end stage renal disease, dialysis dependant, incisional hernia  PROCEDURE: Procedure(s):  LAPAROSCOPIC LYSIS OF ADHESIONS X 15 MIN  LAPARASCOPIC INCISIONAL HERNIA REPAIR WITH MESH  LAPAROSCOPIC OMENTOPEXY  LAPAROSCOPIC RESCTION OF EPIPLOIC APPENDAGES  LAPAROSCOPIC INSERTION CONTINUOUS AMBULATORY PERITONEAL DIALYSIS CATHETER  SURGEON: Surgeon(s):  [name], MD     Discharge Exam: Blood pressure 134/55, pulse 94, temperature 99.1 F (37.3 C),  temperature source Oral, resp. rate 20, height $RemoveBe'5\' 11"'eGqTEDDGL$  (1.803 m), weight 286 lb 13.1 oz (130.1 kg), SpO2 97.00%.  General: Pt awake/alert/oriented x4 in no acute distress  Eyes: PERRL, normal EOM. Sclera clear. No icterus  Neuro: CN II-XII intact w/o focal sensory/motor deficits.  Lymph: No head/neck/groin lymphadenopathy  Psych: No delerium/psychosis/paranoia  HENT: Normocephalic, Mucus membranes moist. No thrush  Neck: Supple, No tracheal deviation  Chest: No chest wall pain w good excursion. R infraclavicular HD cathter site clean  CV: Pulses intact. Regular rhythm  MS: Normal AROM mjr joints. No obvious deformity  Abdomen: Soft. OBESE.  Mildly distended. Dressings c/d/i. Mildly tender at central incisions only. No evidence of peritonitis. No incarcerated hernias.  Ext: SCDs BLE. No mjr edema. No cyanosis  Skin: No petechiae / purpura      Disposition: 01-Home or Self Care  Discharge Instructions   Call MD for:  extreme fatigue    Complete by:  As directed      Call MD for:  extreme fatigue    Complete by:  As directed      Call MD for:  hives    Complete by:  As directed      Call MD for:  hives    Complete by:  As directed      Call MD for:  persistant nausea and vomiting    Complete by:  As directed      Call MD for:  persistant nausea and vomiting    Complete by:  As directed      Call MD for:  redness, tenderness, or signs of infection (pain, swelling, redness, odor or green/yellow discharge around incision site)    Complete by:  As directed      Call MD for:  redness, tenderness, or signs of infection (pain, swelling, redness, odor or green/yellow discharge around incision site)    Complete by:  As directed      Call MD for:  severe uncontrolled pain    Complete by:  As directed      Call MD for:  severe uncontrolled pain    Complete by:  As directed      Call MD for:    Complete by:  As directed   Temperature > 101.563F     Call MD for:    Complete by:  As directed    Temperature > 101.563F     Diet - low sodium heart healthy    Complete by:  As directed      Discharge instructions    Complete by:  As directed   Please see discharge instruction sheets.  Also refer to handout given an office.  Please call our office if you have any questions or concerns [phone]     Discharge instructions    Complete by:  As directed   Please see discharge instruction sheets.  Also refer to handout given an office.  Please call our office if you have any questions or concerns [phone]     Discharge wound care:    Complete by:  As directed   If you have closed incisions, shower and bathe over these incisions with soap and water every day.  Remove all surgical dressings on postoperative day #3.  You do not need to replace dressings over the closed incisions unless you feel more comfortable with a Band-Aid covering it.   If you have an open wound that requires packing, please see wound care instructions.  In general, remove all dressings, wash wound with soap and water and then replace with saline moistened gauze.  Do the dressing change at least every day.  Please call our office [phone] if you have further questions.     Discharge wound care:    Complete by:  As directed   You have closed incisions.  See CAPD catheter post-op instructions.  Leave dressings in place.  Call the peritoneal dialysis Home Therapy nurses at [phone] prior to discharge home to set up time to see them at the dialysis center & begin CAPD care / flushes the day of discharge     Driving Restrictions    Complete by:  As directed   No driving until off narcotics and can safely swerve away without pain during an emergency     Driving Restrictions    Complete by:  As directed   No driving until off narcotics and can safely swerve away without pain during an emergency     Increase activity slowly    Complete by:  As directed   Walk an hour a day.  Use 20-30 minute walks.  When you can  walk 30 minutes without difficulty, increase to low impact/moderate activities such as biking, jogging, swimming, sexual activity..  Eventually can increase to unrestricted activity when not feeling pain.  If you feel pain: STOP!Marland Kitchen   Let pain protect you from overdoing it.  Use ice/heat/over-the-counter pain medications to help minimize his soreness.  Use pain prescriptions as needed to remain active.  It is better to take extra pain medications and be more active than to stay bedridden to avoid all pain medications.     Increase activity slowly    Complete by:  As directed   Walk an hour a day.  Use 20-30 minute walks.  When you can walk 30 minutes without difficulty, increase to low impact/moderate activities such as biking, jogging, swimming, sexual activity..  Eventually can increase to unrestricted activity when not feeling pain.  If you feel pain: STOP!Marland Kitchen   Let pain protect you from overdoing it.  Use ice/heat/over-the-counter pain medications to help minimize his soreness.  Use pain prescriptions as needed to remain active.  It is better to take extra pain medications and be more active than to stay bedridden to avoid all pain medications.     Lifting restrictions    Complete by:  As directed   Avoid heavy lifting initially.  Do not push through pain.  You have no specific weight limit.  Coughing and sneezing or four more stressful to your incision than any lifting you will do. Pain will protect you from injury.  Therefore, avoid intense activity until off all narcotic pain medications.  Coughing and sneezing or four more stressful to your incision than any lifting he will do.     Lifting restrictions    Complete by:  As directed   Avoid heavy lifting initially.  Do not push through pain.  You have no specific weight limit.  Coughing and sneezing or four more stressful to your incision than any lifting you will  do. Pain will protect you from injury.  Therefore, avoid intense activity until off all  narcotic pain medications.  Coughing and sneezing or four more stressful to your incision than any lifting he will do.     May shower / Bathe    Complete by:  As directed      May shower / Bathe    Complete by:  As directed      May walk up steps    Complete by:  As directed      May walk up steps    Complete by:  As directed      Sexual Activity Restrictions    Complete by:  As directed   Sexual activity as tolerated.  Do not push through pain.  Pain will protect you from injury.     Sexual Activity Restrictions    Complete by:  As directed   Sexual activity as tolerated.  Do not push through pain.  Pain will protect you from injury.     Walk with assistance    Complete by:  As directed   Walk over an hour a day.  May use a walker/cane/companion to help with balance and stamina.     Walk with assistance    Complete by:  As directed   Walk over an hour a day.  May use a walker/cane/companion to help with balance and stamina.            Medication List         albuterol 108 (90 BASE) MCG/ACT inhaler  Commonly known as:  PROVENTIL HFA;VENTOLIN HFA  Inhale 2 puffs into the lungs every 4 (four) hours as needed for wheezing. For wheezing     atorvastatin 80 MG tablet  Commonly known as:  LIPITOR  Take 40 mg by mouth daily.     calcitRIOL 0.25 MCG capsule  Commonly known as:  ROCALTROL  Take 0.25-0.5 mcg by mouth daily. Alternates takes 1 tablet and 2 capsules the next day     carvedilol 12.5 MG tablet  Commonly known as:  COREG  Take 37.5 mg by mouth 2 (two) times daily with a meal.     clopidogrel 75 MG tablet  Commonly known as:  PLAVIX  Take 75 mg by mouth daily.     hydrALAZINE 50 MG tablet  Commonly known as:  APRESOLINE  Take 50 mg by mouth 3 (three) times daily.     HYDROcodone-acetaminophen 7.5-325 MG per tablet  Commonly known as:  NORCO  Take 1-2 tablets by mouth every 4 (four) hours as needed for moderate pain or severe pain.     Insulin Pen Needle 31G X  8 MM Misc  Commonly known as:  B-D ULTRAFINE III SHORT PEN  Use with insulin administration.     LEVEMIR 100 UNIT/ML injection  Generic drug:  insulin detemir  Inject 75 Units into the skin 2 (two) times daily.     sevelamer carbonate 800 MG tablet  Commonly known as:  RENVELA  Take 800 mg by mouth 3 (three) times daily with meals.     SOOTHE HYDRATION OP  Place 1 drop into both eyes 3 (three) times daily.     tiotropium 18 MCG inhalation capsule  Commonly known as:  SPIRIVA  Place 18 mcg into inhaler and inhale daily as needed.       Follow-up Information   Follow up with GROSS,STEVEN C., MD In 3 weeks. (To follow up after your operation, To follow  up after your hospital stay)    Specialty:  General Surgery   Contact information:   [place] [address] [phone]       Follow up with CAPD peritoneal dialysis nurses. Schedule an appointment as soon as possible for a visit in 1 day. (For dressing removal & flushing of CAPD catheter.  See ASAP)    Contact information:   [phone]      Follow up with LIN, Hunt Oris. Call today. (to have your HD catheter checked)    Specialty:  Nephrology   Contact information:   [place] [address] [phone]       Signed: [name]. 03/17/2014, 7:33 AM

## 2014-03-17 NOTE — Progress Notes (Signed)
Utilization review completed.  

## 2014-03-17 NOTE — Progress Notes (Signed)
[facility]  [address] [place], [place] [address] Phone: [phone] FAX: [phone]    Jonathon Bailey [id number] 12-12-1955  CARE TEAM:  PCP: Thersa Salt, DO  Outpatient Care Team: Patient Care Team: [name], DO as PCP - General (Pediatrics) [name], MD as Consulting Physician (Nephrology) [name], MD as Consulting Physician (Nephrology)  Inpatient Treatment Team: Treatment Team: Attending Kyanne Rials: [name], MD; Consulting Physician: Louis Meckel, MD; Registered Nurse: Donzetta Matters, RN; Technician: Lindajo Royal, NT; Registered Nurse: July Dizon Pricilla Holm, RN; Technician: Army Chaco, NT; Registered Nurse: Candida Peeling, RN   Subjective:  Sore Sleepy w oxycodone Refising CPAP  Mild nausea x1 yest - better this AM Tol pureed.  Hungry Staying in bed mostly Thinks he is supposed to get HD catheter checked in clinic today  Objective:  Vital signs:  Filed Vitals:   03/16/14 1800 03/16/14 1852 03/16/14 2235 03/17/14 0609  BP: 121/72 133/76 139/75 134/55  Pulse: 85 92 97 94  Temp: 97.6 F (36.4 C) 97.4 F (36.3 C) 100.2 F (37.9 C) 99.1 F (37.3 C)  TempSrc: Oral Oral Oral Oral  Resp: $Remo'16 16 16 20  'rOmSS$ Height:      Weight: 286 lb 13.1 oz (130.1 kg)     SpO2: 94% 94% 97% 97%    Last BM Date: 03/15/14  Intake/Output   Yesterday:  06/23 0701 - 06/24 0700 In: 3 [I.V.:3] Out: 2306 [Urine:165] This shift:     Bowel function:  Flatus: n  BM: n  Drain: clean CAPD catheter  Physical Exam:  General: Pt awake/alert/oriented x4 in no acute distress Eyes: PERRL, normal EOM.  Sclera clear.  No icterus Neuro: CN II-XII intact w/o focal sensory/motor deficits. Lymph: No head/neck/groin lymphadenopathy Psych:  No delerium/psychosis/paranoia HENT: Normocephalic, Mucus membranes moist.  No thrush Neck: Supple, No tracheal deviation Chest:  No chest wall pain w good  excursion.  R infraclavicular HD cathter site clean CV:  Pulses intact.  Regular rhythm MS: Normal AROM mjr joints.  No obvious deformity Abdomen: Soft.  Nondistended.  Dressings c/d/i.  Mod tender at central incisions only.  No evidence of peritonitis.  No incarcerated hernias. Ext:  SCDs BLE.  No mjr edema.  No cyanosis Skin: No petechiae / purpura   Problem List:   Principal Problem:   Recurrent ventral incisional hernia s/p lap repair w mesh 03/15/2014 Active Problems:   Diabetes mellitus type 2 with complications   OBSTRUCTIVE SLEEP APNEA   ESSENTIAL HYPERTENSION   Chronic combined systolic and diastolic heart failure   ESRD (end stage renal disease) on dialysis   COPD (chronic obstructive pulmonary disease)   Morbid obesity   CAPD (continuous ambulatory peritoneal dialysis) status   Assessment  Jonathon Bailey  58 y.o. male  2 Days Post-Op  Procedure(s): PROCEDURE: Procedure(s):  LAPAROSCOPIC LYSIS OF ADHESIONS X 15 MIN  LAPARASCOPIC INCISIONAL HERNIA REPAIR WITH MESH  LAPAROSCOPIC OMENTOPEXY  LAPAROSCOPIC RESCTION OF EPIPLOIC APPENDAGES  LAPAROSCOPIC INSERTION CONTINUOUS AMBULATORY PERITONEAL DIALYSIS CATHETER   Improving  Plan:  -solid diet -HD catheter management per nephrology -bowel regimen -switch to hydrocodone -HD qTTSat -DM control OK.  Levimir/SSI -VTE prophylaxis- SCDs, etc -mobilize as tolerated to help recovery  D/C patient from hospital when patient meets criteria (anticipate later today):  Tolerating oral intake well Ambulating in walkways Adequate pain control without IV medications Having flatus   [name], M.D., F.A.C.S. Gastrointestinal and  Minimally Invasive Surgery [facility] [address], [place],  [address] [phone] Main / Paging   03/17/2014   Results:   Labs: Results for orders placed during the hospital encounter of 03/15/14 (from the past 48 hour(s))  GLUCOSE,  CAPILLARY     Status: Abnormal   Collection Time    03/15/14 11:09 AM      Result Value Ref Range   Glucose-Capillary 118 (*) 70 - 99 mg/dL  GLUCOSE, CAPILLARY     Status: None   Collection Time    03/15/14 11:59 PM      Result Value Ref Range   Glucose-Capillary 74  70 - 99 mg/dL   Comment 1 Notify RN     Comment 2 Documented in Chart    GLUCOSE, CAPILLARY     Status: Abnormal   Collection Time    03/16/14  8:28 AM      Result Value Ref Range   Glucose-Capillary 126 (*) 70 - 99 mg/dL  MRSA PCR SCREENING     Status: None   Collection Time    03/16/14  8:57 AM      Result Value Ref Range   MRSA by PCR NEGATIVE  NEGATIVE   Comment:            The GeneXpert MRSA Assay (FDA     approved for NASAL specimens     only), is one component of a     comprehensive MRSA colonization     surveillance program. It is not     intended to diagnose MRSA     infection nor to guide or     monitor treatment for     MRSA infections.  GLUCOSE, CAPILLARY     Status: Abnormal   Collection Time    03/16/14 12:22 PM      Result Value Ref Range   Glucose-Capillary 185 (*) 70 - 99 mg/dL  CBC     Status: Abnormal   Collection Time    03/16/14  2:47 PM      Result Value Ref Range   WBC 8.1  4.0 - 10.5 K/uL   RBC 2.98 (*) 4.22 - 5.81 MIL/uL   Hemoglobin 8.6 (*) 13.0 - 17.0 g/dL   HCT 28.0 (*) 39.0 - 52.0 %   MCV 94.0  78.0 - 100.0 fL   MCH 28.9  26.0 - 34.0 pg   MCHC 30.7  30.0 - 36.0 g/dL   RDW 13.9  11.5 - 15.5 %   Platelets 214  150 - 400 K/uL  RENAL FUNCTION PANEL     Status: Abnormal   Collection Time    03/16/14  2:47 PM      Result Value Ref Range   Sodium 140  137 - 147 mEq/L   Potassium 4.8  3.7 - 5.3 mEq/L   Chloride 99  96 - 112 mEq/L   CO2 24  19 - 32 mEq/L   Glucose, Bld 192 (*) 70 - 99 mg/dL   BUN 67 (*) 6 - 23 mg/dL   Creatinine, Ser 10.80 (*) 0.50 - 1.35 mg/dL   Calcium 8.8  8.4 - 10.5 mg/dL   Phosphorus 7.9 (*) 2.3 - 4.6 mg/dL   Albumin 3.5  3.5 - 5.2 g/dL   GFR calc  non Af Amer 5 (*) >90 mL/min   GFR calc Af Amer 5 (*) >90 mL/min   Comment: (NOTE)     The eGFR has been calculated using  the CKD EPI equation.     This calculation has not been validated in all clinical situations.     eGFR's persistently <90 mL/min signify possible Chronic Kidney     Disease.  HEPATITIS B SURFACE ANTIGEN     Status: None   Collection Time    03/16/14  4:37 PM      Result Value Ref Range   Hepatitis B Surface Ag NEGATIVE  NEGATIVE   Comment: Performed at [facility], CAPILLARY     Status: Abnormal   Collection Time    03/16/14  7:01 PM      Result Value Ref Range   Glucose-Capillary 141 (*) 70 - 99 mg/dL  GLUCOSE, CAPILLARY     Status: Abnormal   Collection Time    03/16/14 10:29 PM      Result Value Ref Range   Glucose-Capillary 138 (*) 70 - 99 mg/dL    Imaging / Studies: No results found.  Medications / Allergies: per chart  Antibiotics: Anti-infectives   Start     Dose/Rate Route Frequency Ordered Stop   03/16/14 0800  vancomycin (VANCOCIN) 1,500 mg in sodium chloride 0.9 % 500 mL IVPB     1,500 mg 250 mL/hr over 120 Minutes Intravenous  Once 03/16/14 0747 03/16/14 1302   03/15/14 1430  ceFAZolin (ANCEF) IVPB 1 g/50 mL premix     1 g 100 mL/hr over 30 Minutes Intravenous Every 6 hours 03/15/14 1311 03/16/14 0419   03/15/14 0600  ceFAZolin (ANCEF) 3 g in dextrose 5 % 50 mL IVPB     3 g 160 mL/hr over 30 Minutes Intravenous On call to O.R. 03/14/14 1311 03/15/14 0736       Note: This dictation was prepared with voice recognition software technology. In this process, transcriptional errors may occur.  Attempts are made to proofread & provide accurate documentation.  Any errors are unintentional.

## 2014-03-17 NOTE — Progress Notes (Signed)
Patient able to ambulate about 170 ft around the unit, encourage patient to try to ambulate more but patient refused. Will try again later.

## 2014-03-17 NOTE — Progress Notes (Signed)
Home therapy services called [phone].  Jonathon Bailey answered.  She was notified of the placement of a peritoneal dialysis catheter.  They will work to coordinate close followup

## 2014-03-17 NOTE — Progress Notes (Signed)
Patient refused to wear the CPAP and CPAP has been removed from the patient's room.  RT will continue to monitor.

## 2014-03-17 NOTE — Telephone Encounter (Signed)
Call Pt about DM care. Family member answered his phone and stated that Pt is currently in surgery. Please call Pt back later to schedule appointment to check LDL and A1C.

## 2014-03-17 NOTE — Progress Notes (Signed)
Subjective:  Sitting in chair, pain improving, advancing appetite (now with pureed foods)  Objective: Vital signs in last 24 hours: Temp:  [97.4 F (36.3 C)-100.2 F (37.9 C)] 99.1 F (37.3 C) (06/24 0609) Pulse Rate:  [82-97] 94 (06/24 0609) Resp:  [16-20] 20 (06/24 0609) BP: (109-159)/(55-83) 134/55 mmHg (06/24 0609) SpO2:  [94 %-98 %] 97 % (06/24 0609) Weight:  [130.1 kg (286 lb 13.1 oz)] 130.1 kg (286 lb 13.1 oz) (06/23 1800) Weight change:   Intake/Output from previous day: 06/23 0701 - 06/24 0700 In: 3 [I.V.:3] Out: 2306 [Urine:165] Intake/Output this shift:   Lab Results:  Recent Labs  03/15/14 0720 03/16/14 1447  WBC  --  8.1  HGB 10.2* 8.6*  HCT 30.0* 28.0*  PLT  --  214   BMET:  Recent Labs  03/15/14 0720 03/16/14 1447  NA 142 140  K 4.1 4.8  CL  --  99  CO2  --  24  GLUCOSE 141* 192*  BUN  --  67*  CREATININE  --  10.80*  CALCIUM  --  8.8  ALBUMIN  --  3.5   No results found for this basename: PTH,  in the last 72 hours Iron Studies: No results found for this basename: IRON, TIBC, TRANSFERRIN, FERRITIN,  in the last 72 hours  Studies/Results: No results found.  EXAM:  General appearance: Alert, in no apparent distress  Resp: Poor respiratory effort, sec to pain, but clear  Cardio: RRR without murmur or rub  GI: Soft, tender only over incision site, with dressing  Extremities: No edema  Access: R IJ catheter   Dialysis Orders: TTS @ [place]  4:30 131 kg 2K/2.25Ca 400/800 Heparin 7000 U, then 4000 U mid-HD R IJ catheter, AVF @ LFA  No Hectorol Aranesp 25 mcg & Venofer 50 mg on Thurs.  Assessment/Plan: 1. Laparoscopic placement of PD catheter & ventral hernia repair - 6/22 per Dr. Johney Maine, mobilizing as tolerated.  2. ESRD - HD on TTS @ [facility] [place], K 4.8.  Next HD tomorrow.  3. Dialysis access - appt with Dr. Augustin Coupe 6/24 to evaluate poorly functioning catheter with limited BFR sec to arterial pressures, no problems reported yesterday. Reschedule appt if  necessary. 4. HTN/volume - BP 134/55 on Carvedilol 37.5 mg bid, Hydralazine 50 mg tid; wt 130.1 kg s/p net UF 2.1 L yesterday.  5. Anemia - Hgb down to 8.6, on Aranesp 25 mcg & Fe on Thurs.  Recheck CBC pre-HD tomorrow. 6. Sec HPT - Ca 8.8 (9.2 corrected), P 7.9, iPTH 336; Renvela with meals.  7. Nutrition - Alb 3.6, advancing diet as tolerated.  8. DM - Insulin per primary.  9. Hx CAD - s/p stents, on Plavix, Coreg.     LOS: 2 days   LYLES,CHARLES 03/17/2014,8:48 AM

## 2014-03-17 NOTE — Progress Notes (Signed)
I have seen and examined this patient and agree with the plan of care . Still on BM and painful distended abdomen , no other issues seems to be improving  WEBB,MARTIN W 03/17/2014, 12:55 PM

## 2014-03-17 NOTE — Progress Notes (Signed)
Orthopedic Tech Progress Note Patient Details:  Jonathon Bailey 1956/07/30 [id number]  Ortho Devices Type of Ortho Device: Abdominal binder Ortho Device/Splint Interventions: Application   Cammer, Theodoro Parma 03/17/2014, 10:15 AM

## 2014-03-17 NOTE — Progress Notes (Signed)
NP made a ware of patient status, patient able to ambulate short distance, tolerated lunch, pain is ok but patient still not passing flatus. Patient not meeting the criteria.

## 2014-03-18 DIAGNOSIS — K430 Incisional hernia with obstruction, without gangrene: Secondary | ICD-10-CM | POA: Diagnosis not present

## 2014-03-18 LAB — CBC
HEMATOCRIT: 26.3 % — AB (ref 39.0–52.0)
Hemoglobin: 8.2 g/dL — ABNORMAL LOW (ref 13.0–17.0)
MCH: 28.9 pg (ref 26.0–34.0)
MCHC: 31.2 g/dL (ref 30.0–36.0)
MCV: 92.6 fL (ref 78.0–100.0)
Platelets: 185 10*3/uL (ref 150–400)
RBC: 2.84 MIL/uL — ABNORMAL LOW (ref 4.22–5.81)
RDW: 13.9 % (ref 11.5–15.5)
WBC: 11.2 10*3/uL — ABNORMAL HIGH (ref 4.0–10.5)

## 2014-03-18 LAB — GLUCOSE, CAPILLARY
GLUCOSE-CAPILLARY: 218 mg/dL — AB (ref 70–99)
GLUCOSE-CAPILLARY: 161 mg/dL — AB (ref 70–99)
Glucose-Capillary: 58 mg/dL — ABNORMAL LOW (ref 70–99)
Glucose-Capillary: 95 mg/dL (ref 70–99)
Glucose-Capillary: 117 mg/dL — ABNORMAL HIGH (ref 70–99)

## 2014-03-18 LAB — RENAL FUNCTION PANEL
ALBUMIN: 3.3 g/dL — AB (ref 3.5–5.2)
BUN: 60 mg/dL — ABNORMAL HIGH (ref 6–23)
CO2: 24 mEq/L (ref 19–32)
CREATININE: 11.15 mg/dL — AB (ref 0.50–1.35)
Calcium: 9 mg/dL (ref 8.4–10.5)
Chloride: 95 mEq/L — ABNORMAL LOW (ref 96–112)
GFR calc Af Amer: 5 mL/min — ABNORMAL LOW (ref >90)
GFR calc non Af Amer: 4 mL/min — ABNORMAL LOW (ref >90)
Glucose, Bld: 100 mg/dL — ABNORMAL HIGH (ref 70–99)
PHOSPHORUS: 8 mg/dL — AB (ref 2.3–4.6)
Potassium: 4.7 mEq/L (ref 3.7–5.3)
Sodium: 137 mEq/L (ref 137–147)

## 2014-03-18 MED ORDER — SODIUM CHLORIDE 0.9 % IV SOLN: 100.0000 mL | INTRAVENOUS | Status: DC | PRN

## 2014-03-18 MED ORDER — SODIUM CHLORIDE 0.9 % IV SOLN
62.5000 mg | INTRAVENOUS | Status: DC
  Filled 2014-03-18 (×2): qty 5

## 2014-03-18 MED ORDER — LIDOCAINE-PRILOCAINE 2.5-2.5 % EX CREA
1.0000 "application " | TOPICAL_CREAM | CUTANEOUS | Status: DC | PRN
  Filled 2014-03-18: qty 5

## 2014-03-18 MED ORDER — RENA-VITE PO TABS
1.0000 | ORAL_TABLET | Freq: Every day | ORAL | Status: DC
  Filled 2014-03-18 (×2): qty 1

## 2014-03-18 MED ORDER — LIDOCAINE HCL (PF) 1 % IJ SOLN: 5.0000 mL | INTRAMUSCULAR | Status: DC | PRN

## 2014-03-18 MED ORDER — HEPARIN SODIUM (PORCINE) 1000 UNIT/ML DIALYSIS: 1000.0000 [IU] | INTRAMUSCULAR | Status: DC | PRN

## 2014-03-18 MED ORDER — LIDOCAINE-PRILOCAINE 2.5-2.5 % EX CREA
1.0000 | TOPICAL_CREAM | CUTANEOUS | Status: DC | PRN
Start: 2014-03-18 — End: 2014-03-18

## 2014-03-18 MED ORDER — NEPRO/CARBSTEADY PO LIQD
237.0000 mL | ORAL | Status: DC | PRN
  Filled 2014-03-18: qty 237

## 2014-03-18 MED ORDER — POLYETHYLENE GLYCOL 3350 17 G PO PACK
34.0000 g | PACK | Freq: Two times a day (BID) | ORAL | Status: DC
  Administered 2014-03-18 – 2014-03-19 (×3): 34 g via ORAL
  Filled 2014-03-18 (×3): qty 2

## 2014-03-18 MED ORDER — HYDROMORPHONE HCL PF 1 MG/ML IJ SOLN
INTRAMUSCULAR | Status: AC
  Filled 2014-03-18: qty 1

## 2014-03-18 MED ORDER — PENTAFLUOROPROP-TETRAFLUOROETH EX AERO: 1.0000 "application " | INHALATION_SPRAY | CUTANEOUS | Status: DC | PRN

## 2014-03-18 MED ORDER — SODIUM CHLORIDE 0.9 % IV SOLN: 62.5000 mg | INTRAVENOUS | Status: DC

## 2014-03-18 MED ORDER — DARBEPOETIN ALFA-POLYSORBATE 200 MCG/0.4ML IJ SOLN
INTRAMUSCULAR | Status: AC
  Filled 2014-03-18: qty 0.4

## 2014-03-18 MED ORDER — ALTEPLASE 2 MG IJ SOLR
2.0000 mg | Freq: Once | INTRAMUSCULAR | Status: DC | PRN
  Filled 2014-03-18: qty 2

## 2014-03-18 MED ORDER — NEPRO/CARBSTEADY PO LIQD: 237.0000 mL | ORAL | Status: DC | PRN

## 2014-03-18 MED ORDER — ALTEPLASE 2 MG IJ SOLR: 2.0000 mg | Freq: Once | INTRAMUSCULAR | Status: DC | PRN

## 2014-03-18 MED ORDER — DARBEPOETIN ALFA-POLYSORBATE 200 MCG/0.4ML IJ SOLN
200.0000 ug | INTRAMUSCULAR | Status: DC
  Administered 2014-03-18: 200 ug via INTRAVENOUS

## 2014-03-18 NOTE — Care Management Note (Addendum)
  Page 2 of 2   03/19/2014     10:20:42 AM CARE MANAGEMENT NOTE 03/19/2014  Patient:  Jonathon Bailey, Jonathon Bailey   Account Number:  [id number]  Date Initiated:  03/18/2014  Documentation initiated by:  Magdalen Spatz  Subjective/Objective Assessment:     Action/Plan:   Anticipated DC Date:  03/18/2014   Anticipated DC Plan:  [place]         Choice offered to / List presented to:  C-1 Patient   DME arranged  Vassie Moselle      DME agency  [place] arranged  [employer]   Status of service:   Medicare Important Message given?   (If response is "NO", the following Medicare IM given date fields will be blank) Date Medicare IM given:   Date Additional Medicare IM given:    Discharge Disposition:    Per UR Regulation:    If discussed at Long Length of Stay Meetings, dates discussed:    Comments:    03-19-14 Patient's step daughter Caryl Pina returned phone call aware of plan and understands patient wants to go home with home health . Magdalen Spatz RN BSN [phone]  03-19-14 PT recommending HHPT . Dr Johney Maine aware . Dr Johney Maine feels safest discharge plan would be to SNF for rehab ,  Patient aware but refusing .   Magdalen Spatz RN  BSN [phone]    03-19-14 PT verbally told NCM patient is safe to go home. Patient states he is comfortable  to go home with HHPT and walker , he has a ramp at home and a chair lift .  Paged Dr Johney Maine. Magdalen Spatz RN BSN  03-19-14 Patient lives with 57 year old son , patient's wife had surgery yesterday . Patient  consulted for NCM to call his step daughter Caryl Pina [phone] to discuss discharge planning . Left Caryl Pina voice mail awaiting return call. Patient does not have Medicaid , therefore unable to set up transportation to dialysis , however patient states he has friends who could transport him for dialysis .  Patient working with PT currently .  Explained to patient he is admitted under  observation status therefore Medicare would not cover short term rehab .   Magdalen Spatz RN BSN [phone]

## 2014-03-18 NOTE — Progress Notes (Addendum)
Jonathon Bailey  [address] [place], [place] [address] Phone: [phone] FAX: [phone]    Jonathon Bailey [id number] 1956-08-10  CARE TEAM:  PCP: Thersa Salt, DO  Outpatient Care Team: Patient Care Team: [name], DO as PCP - General (Pediatrics) [name], MD as Consulting Physician (Nephrology) [name], MD as Consulting Physician (Nephrology)  Inpatient Treatment Team: Treatment Team: Attending Provider: [name], MD; Consulting Physician: Louis Meckel, MD; Registered Nurse: Donzetta Matters, RN; Registered Nurse: July Dizon Pricilla Holm, RN; Technician: Army Chaco, NT; Registered Nurse: Candida Peeling, RN; Registered Nurse: Claiborne Billings, RN   Subjective:  Sore - better w hydrocodone.  Needed IV meds yesterday.  Not overnight Refusing CPAP  Tol solids Staying in bed mostly.  Walked more Nephrology at bedside - Jonathon Bailey  Objective:  Vital signs:  Filed Vitals:   03/17/14 1613 03/17/14 2144 03/18/14 0616 03/18/14 0649  BP: 105/48 132/73 128/58   Pulse:  90 82   Temp:  97.7 F (36.5 C) 98 F (36.7 C)   TempSrc:  Oral Oral   Resp:  20 19   Height:      Weight:    293 lb 14 oz (133.3 kg)  SpO2:  93% 95%     Last BM Date: 03/15/14  Intake/Output   Yesterday:  06/24 0701 - 06/25 0700 In: 1640 [P.O.:1640] Out: 400 [Urine:400] This shift:     Bowel function:  Flatus: Yes  BM: n  Drain: clean CAPD catheter  Physical Exam:  General: Pt awake/alert/oriented x4 in no acute distress Eyes: PERRL, normal EOM.  Sclera clear.  No icterus Neuro: CN II-XII intact w/o focal sensory/motor deficits. Lymph: No head/neck/groin lymphadenopathy Psych:  No delerium/psychosis/paranoia HENT: Normocephalic, Mucus membranes moist.  No thrush Neck: Supple, No tracheal deviation Chest:  No chest wall pain w good excursion.  R infraclavicular HD cathter site clean CV:  Pulses intact.   Regular rhythm MS: Normal AROM mjr joints.  No obvious deformity Abdomen: Soft.  Nondistended.  Dressings c/d/i.  Mild tender at central incisions only.  Binder in place.  No evidence of peritonitis.  No incarcerated hernias. Ext:  SCDs BLE.  No mjr edema.  No cyanosis Skin: No petechiae / purpura   Problem List:   Principal Problem:   Recurrent ventral incisional hernia s/p lap repair w mesh 03/15/2014 Active Problems:   Diabetes mellitus type 2 with complications   OBSTRUCTIVE SLEEP APNEA   ESSENTIAL HYPERTENSION   Chronic combined systolic and diastolic heart failure   ESRD (end stage renal disease) on dialysis   COPD (chronic obstructive pulmonary disease)   Morbid obesity   CAPD (continuous ambulatory peritoneal dialysis) status   Assessment  Jonathon Bailey  58 y.o. male  3 Days Post-Op  Procedure(s): PROCEDURE: Procedure(s):  LAPAROSCOPIC LYSIS OF ADHESIONS X 15 MIN  LAPARASCOPIC INCISIONAL HERNIA REPAIR WITH MESH  LAPAROSCOPIC OMENTOPEXY  LAPAROSCOPIC RESCTION OF EPIPLOIC APPENDAGES  LAPAROSCOPIC INSERTION CONTINUOUS AMBULATORY PERITONEAL DIALYSIS CATHETER   Improving  Plan:  -solid diet -HD catheter management per nephrology.  D/w Jonathon Bailey.  They plan outpt f/u since HD cathteter working well as inpatient -bowel regimen - inc Miralax -switch to hydrocodone -HD qTTSat -DM control OK.  Levimir/SSI -VTE prophylaxis- SCDs, etc -mobilize as tolerated to help recovery.  Rec HH PT at least.  Pt does not want to consider SNF.  Will try to make sure he has support  D/C patient from hospital when patient meets criteria (anticipate later today):  Tolerating oral intake well Ambulating in walkways Adequate pain control without IV medications HH PT arranged   [name], M.D., F.A.C.S. Gastrointestinal and Minimally Invasive Surgery [facility] [address], [place], [place] [address] [phone] Main /  Paging   03/18/2014   Results:   Labs: Results for orders placed during the hospital encounter of 03/15/14 (from the past 48 hour(s))  GLUCOSE, CAPILLARY     Status: Abnormal   Collection Time    03/16/14  8:28 AM      Result Value Ref Range   Glucose-Capillary 126 (*) 70 - 99 mg/dL  MRSA PCR SCREENING     Status: None   Collection Time    03/16/14  8:57 AM      Result Value Ref Range   MRSA by PCR NEGATIVE  NEGATIVE   Comment:            The GeneXpert MRSA Assay (FDA     approved for NASAL specimens     only), is one component of a     comprehensive MRSA colonization     surveillance program. It is not     intended to diagnose MRSA     infection nor to guide or     monitor treatment for     MRSA infections.  GLUCOSE, CAPILLARY     Status: Abnormal   Collection Time    03/16/14 12:22 PM      Result Value Ref Range   Glucose-Capillary 185 (*) 70 - 99 mg/dL  CBC     Status: Abnormal   Collection Time    03/16/14  2:47 PM      Result Value Ref Range   WBC 8.1  4.0 - 10.5 K/uL   RBC 2.98 (*) 4.22 - 5.81 MIL/uL   Hemoglobin 8.6 (*) 13.0 - 17.0 g/dL   HCT 28.0 (*) 39.0 - 52.0 %   MCV 94.0  78.0 - 100.0 fL   MCH 28.9  26.0 - 34.0 pg   MCHC 30.7  30.0 - 36.0 g/dL   RDW 13.9  11.5 - 15.5 %   Platelets 214  150 - 400 K/uL  RENAL FUNCTION PANEL     Status: Abnormal   Collection Time    03/16/14  2:47 PM      Result Value Ref Range   Sodium 140  137 - 147 mEq/L   Potassium 4.8  3.7 - 5.3 mEq/L   Chloride 99  96 - 112 mEq/L   CO2 24  19 - 32 mEq/L   Glucose, Bld 192 (*) 70 - 99 mg/dL   BUN 67 (*) 6 - 23 mg/dL   Creatinine, Ser 10.80 (*) 0.50 - 1.35 mg/dL   Calcium 8.8  8.4 - 10.5 mg/dL   Phosphorus 7.9 (*) 2.3 - 4.6 mg/dL   Albumin 3.5  3.5 - 5.2 g/dL   GFR calc non Af Amer 5 (*) >90 mL/min   GFR calc Af Amer 5 (*) >90 mL/min   Comment: (NOTE)     The eGFR has been calculated using the CKD EPI equation.     This calculation has not been validated in all clinical  situations.     eGFR's persistently <90 mL/min signify possible Chronic Kidney     Disease.  HEPATITIS B SURFACE ANTIGEN     Status: None   Collection Time    03/16/14  4:37 PM      Result Value Ref Range   Hepatitis B Surface Ag NEGATIVE  NEGATIVE   Comment: Performed at [facility] [place], CAPILLARY     Status: Abnormal   Collection Time    03/16/14  7:01 PM      Result Value Ref Range   Glucose-Capillary 141 (*) 70 - 99 mg/dL  GLUCOSE, CAPILLARY     Status: Abnormal   Collection Time    03/16/14 10:29 PM      Result Value Ref Range   Glucose-Capillary 138 (*) 70 - 99 mg/dL  HEMOGLOBIN A1C     Status: Abnormal   Collection Time    03/17/14  4:34 AM      Result Value Ref Range   Hemoglobin A1C 7.4 (*) <5.7 %   Comment: (NOTE)                                                                               According to the ADA Clinical Practice Recommendations for 2011, when     HbA1c is used as a screening test:      >=6.5%   Diagnostic of Diabetes Mellitus               (if abnormal result is confirmed)     5.7-6.4%   Increased risk of developing Diabetes Mellitus     References:Diagnosis and Classification of Diabetes Mellitus,Diabetes     WGYK,5993,57(SVXBL 1):S62-S69 and Standards of Medical Care in             Diabetes - 2011,Diabetes Care,2011,34 (Suppl 1):S11-S61.   Mean Plasma Glucose 166 (*) <117 mg/dL   Comment: Performed at [place], CAPILLARY     Status: Abnormal   Collection Time    03/17/14  7:38 AM      Result Value Ref Range   Glucose-Capillary 110 (*) 70 - 99 mg/dL  GLUCOSE, CAPILLARY     Status: None   Collection Time    03/17/14 11:51 AM      Result Value Ref Range   Glucose-Capillary 83  70 - 99 mg/dL  GLUCOSE, CAPILLARY     Status: Abnormal   Collection Time    03/17/14  5:17 PM      Result Value Ref Range   Glucose-Capillary 149 (*) 70 - 99 mg/dL  GLUCOSE, CAPILLARY     Status: Abnormal   Collection Time     03/17/14  9:43 PM      Result Value Ref Range   Glucose-Capillary 120 (*) 70 - 99 mg/dL   Comment 1 Notify RN      Imaging / Studies: No results found.  Medications / Allergies: per chart  Antibiotics: Anti-infectives   Start     Dose/Rate Route Frequency Ordered Stop   03/16/14 0800  vancomycin (VANCOCIN) 1,500 mg in sodium chloride 0.9 % 500 mL IVPB     1,500 mg 250 mL/hr over 120 Minutes Intravenous  Once 03/16/14 0747 03/16/14 1302   03/15/14 1430  ceFAZolin (ANCEF) IVPB 1 g/50 mL premix     1 g 100 mL/hr over 30 Minutes Intravenous Every 6 hours 03/15/14 1311 03/16/14 0419  03/15/14 0600  ceFAZolin (ANCEF) 3 g in dextrose 5 % 50 mL IVPB     3 g 160 mL/hr over 30 Minutes Intravenous On call to O.R. 03/14/14 1311 03/15/14 0736       Note: This dictation was prepared with voice recognition software technology. In this process, transcriptional errors may occur.  Attempts are made to proofread & provide accurate documentation.  Any errors are unintentional.

## 2014-03-18 NOTE — Progress Notes (Signed)
Subjective:  Tolerating solid food, pain better, ambulating with assistance  Objective: Vital signs in last 24 hours: Temp:  [97.7 F (36.5 C)-98 F (36.7 C)] 98 F (36.7 C) (06/25 0616) Pulse Rate:  [80-90] 82 (06/25 0616) Resp:  [19-20] 19 (06/25 0616) BP: (105-132)/(48-73) 128/58 mmHg (06/25 0616) SpO2:  [93 %-95 %] 95 % (06/25 0616) Weight:  [133.3 kg (293 lb 14 oz)] 133.3 kg (293 lb 14 oz) (06/25 0649) Weight change: 3.2 kg (7 lb 0.9 oz)  Intake/Output from previous day: 06/24 0701 - 06/25 0700 In: 1640 [P.O.:1640] Out: 400 [Urine:400] Intake/Output this shift:   Lab Results:  Recent Labs  03/16/14 1447  WBC 8.1  HGB 8.6*  HCT 28.0*  PLT 214   BMET:  Recent Labs  03/16/14 1447  NA 140  K 4.8  CL 99  CO2 24  GLUCOSE 192*  BUN 67*  CREATININE 10.80*  CALCIUM 8.8  ALBUMIN 3.5   No results found for this basename: PTH,  in the last 72 hours Iron Studies: No results found for this basename: IRON, TIBC, TRANSFERRIN, FERRITIN,  in the last 72 hours  Studies/Results: No results found.  EXAM:  General appearance: Alert, in no apparent distress  Resp: Poor respiratory effort, sec to pain, but clear  Cardio: RRR without murmur or rub  GI: Soft, tender only over incision site, with dressing  Extremities: No edema  Access: R IJ catheter   Dialysis Orders: TTS @ [facility]  4:30 131 kg 2K/2.25Ca 400/800 Heparin 7000 U, then 4000 U mid-HD R IJ catheter, AVF @ LFA  No Hectorol Aranesp 25 mcg & Venofer 50 mg on Thurs.  Assessment/Plan: 1. Laparoscopic placement of PD catheter & ventral hernia repair - 6/22 per Dr. Johney Maine, ambulating as tolerated, pain improving.  2. ESRD - HD on TTS @ [place], K 4.8. HD pending today.  3. Dialysis access - appt with Dr. Augustin Coupe 6/24 to evaluate poorly functioning catheter with limited BFR sec to arterial pressures, no problems reported on 6/23. Pt prefers Dr. Augustin Coupe, reschedule appt if necessary.  4. HTN/volume - BP 128/58 on Carvedilol 37.5  mg bid, Hydralazine 50 mg tid; wt 133.3 kg with HD pending.  5. Anemia - Hgb down to 8.6, on Aranesp 25 mcg & Fe on Thurs. Recheck CBC pre-HD today.  6. Sec HPT - Ca 8.8 (9.2 corrected), P 7.9, iPTH 336; Renvela with meals.  7. Nutrition - Alb 3.5, tolerating solids. Add vitamin. 8. DM - Insulin per primary.  9. Hx CAD - s/p stents, on Plavix, Coreg.     LOS: 3 days   Jonathon Bailey 03/18/2014,8:16 AM

## 2014-03-18 NOTE — Procedures (Signed)
I have seen and examined this patient and agree with the plan of care. BP 110/70 goal 3 L seen on dilaysis WEBB,MARTIN W 03/18/2014, 8:47 AM

## 2014-03-18 NOTE — Progress Notes (Signed)
Pt in dialysis 0800 to 1300,, fatigued when returned to room, bed to chair x2, sat on side of bed, too fatigued to walk hall, asked to walk x3 by staff.  Step daughter Caryl Pina called to state her Mom, primary caregiver was in or at [facility] tibia & fibula this afternoon.  Ashley cell [phone].

## 2014-03-18 NOTE — Progress Notes (Signed)
I have seen and examined this patient and agree with the plan of care  [facility] W 03/18/2014, 12:13 PM

## 2014-03-19 DIAGNOSIS — K430 Incisional hernia with obstruction, without gangrene: Secondary | ICD-10-CM | POA: Diagnosis not present

## 2014-03-19 LAB — GLUCOSE, CAPILLARY
Glucose-Capillary: 157 mg/dL — ABNORMAL HIGH (ref 70–99)
Glucose-Capillary: 165 mg/dL — ABNORMAL HIGH (ref 70–99)
Glucose-Capillary: 137 mg/dL — ABNORMAL HIGH (ref 70–99)

## 2014-03-19 NOTE — Evaluation (Signed)
Physical Therapy Evaluation Patient Details Name: Jonathon Bailey MRN: [id number] DOB: 1955-10-07 Today's Date: 03/19/2014   History of Present Illness  admitted for recurrent ventral incisional hernia s/p lap repair w mesh; laparoscopic insertion of continuous ambulatory peritoneal dialysis catheter 03/15/2014  Clinical Impression  Patient ambulated with supervision assist on unit.  Feel patient is safe for return home with family checking in on him. Patient has ramped entrance and stair lift for inside steps, so no barriers with stairs.  Patient will benefit from HHPT to progress ambulation back to baseline (no assistive device) and improve activity tolerance.      Follow Up Recommendations Home health PT    Equipment Recommendations  Rolling walker with 5" wheels    Recommendations for Other Services       Precautions / Restrictions Precautions Precautions: None      Mobility  Bed Mobility Overal bed mobility: Modified Independent             General bed mobility comments: used rail to raise shoulders off bed  Transfers Overall transfer level: Modified independent Equipment used: Rolling walker (2 wheeled)             General transfer comment: used UE to raise to power up  Ambulation/Gait Ambulation/Gait assistance: Supervision Ambulation Distance (Feet): 75 Feet Assistive device: Rolling walker (2 wheeled) Gait Pattern/deviations: Step-through pattern;Wide base of support Gait velocity: decreased      Stairs            Wheelchair Mobility    Modified Rankin (Stroke Patients Only)       Balance Overall balance assessment: No apparent balance deficits (not formally assessed)                                           Pertinent Vitals/Pain 3/10 in abdomen due to incision.  Limited activity within tolerance.    Home Living Family/patient expects to be discharged to:: Private residence Living Arrangements:  Children;Spouse/significant other Available Help at Discharge: Other (Comment) (wife with broken leg; 55 yo son at home) Type of Home: House Home Access: Ramped entrance     Home Layout: Multi-level (has chair lift for steps)        Prior Function Level of Independence: Independent         Comments: decreased endurance; unable to ambulate long distances     Hand Dominance   Dominant Hand: Right    Extremity/Trunk Assessment   Upper Extremity Assessment: Generalized weakness           Lower Extremity Assessment: Generalized weakness         Communication   Communication: No difficulties  Cognition Arousal/Alertness: Awake/alert Behavior During Therapy: WFL for tasks assessed/performed Overall Cognitive Status: Within Functional Limits for tasks assessed                      General Comments      Exercises        Assessment/Plan    PT Assessment All further PT needs can be met in the next venue of care  PT Diagnosis Generalized weakness   PT Problem List Decreased strength;Decreased activity tolerance;Decreased mobility  PT Treatment Interventions     PT Goals (Current goals can be found in the Care Plan section) Acute Rehab PT Goals PT Goal Formulation: No goals set, d/c therapy (patient set to d/c  today)    Frequency     Barriers to discharge        Co-evaluation               End of Session   Activity Tolerance: Patient tolerated treatment well Patient left: in bed;with call bell/phone within reach      Functional Assessment Tool Used: clinical judgement Functional Limitation: Mobility: Walking and moving around Mobility: Walking and Moving Around Current Status ([id number]): At least 1 percent but less than 20 percent impaired, limited or restricted Mobility: Walking and Moving Around Goal Status [phone]): At least 1 percent but less than 20 percent impaired, limited or restricted Mobility: Walking and Moving Around Discharge  Status [id number]): At least 1 percent but less than 20 percent impaired, limited or restricted    Time: 0945-1004 PT Time Calculation (min): 19 min   Charges:   PT Evaluation $Initial PT Evaluation Tier I: 1 Procedure     PT G Codes:   Functional Assessment Tool Used: clinical judgement Functional Limitation: Mobility: Walking and moving around    Shanna Cisco, [place] 03/19/2014, 10:13 AM

## 2014-03-19 NOTE — Progress Notes (Signed)
[facility] ROUNDING NOTE   Subjective:   Interval History: pain less bu no bowel movement. Patient eating . Wife fractured leg yesterday  Objective:  Vital signs in last 24 hours:  Temp:  [97.9 F (36.6 C)-99.1 F (37.3 C)] 98.1 F (36.7 C) (06/26 1222) Pulse Rate:  [90-101] 90 (06/26 1222) Resp:  [16-18] 18 (06/26 1222) BP: (80-116)/(48-69) 80/48 mmHg (06/26 1222) SpO2:  [92 %-95 %] 92 % (06/26 1222)  Weight change: 0.2 kg (7.1 oz) Filed Weights   03/18/14 0649 03/18/14 0835 03/18/14 1250  Weight: 133.3 kg (293 lb 14 oz) 133.5 kg (294 lb 5 oz) 130.3 kg (287 lb 4.2 oz)    Intake/Output: I/O last 3 completed shifts: In: 1260 [P.O.:1260] Out: [id number] [Urine:400; Other:3326]   Intake/Output this shift:     General appearance: Alert, in no apparent distress  Resp: Poor respiratory effort, sec to pain, but clear  Cardio: RRR without murmur or rub  GI: Soft, tender only over incision site, with dressing  Extremities: No edema  Access: R IJ catheter     Basic Metabolic Panel:  Recent Labs Lab 03/15/14 0720 03/16/14 1447 03/18/14 0851  NA 142 140 137  K 4.1 4.8 4.7  CL  --  99 95*  CO2  --  24 24  GLUCOSE 141* 192* 100*  BUN  --  67* 60*  CREATININE  --  10.80* 11.15*  CALCIUM  --  8.8 9.0  PHOS  --  7.9* 8.0*    Liver Function Tests:  Recent Labs Lab 03/16/14 1447 03/18/14 0851  ALBUMIN 3.5 3.3*   No results found for this basename: LIPASE, AMYLASE,  in the last 168 hours No results found for this basename: AMMONIA,  in the last 168 hours  CBC:  Recent Labs Lab 03/15/14 0720 03/16/14 1447 03/18/14 0851  WBC  --  8.1 11.2*  HGB 10.2* 8.6* 8.2*  HCT 30.0* 28.0* 26.3*  MCV  --  94.0 92.6  PLT  --  214 185    Cardiac Enzymes: No results found for this basename: CKTOTAL, CKMB, CKMBINDEX, TROPONINI,  in the last 168 hours  BNP: No components found with this basename: POCBNP,   CBG:  Recent Labs Lab 03/18/14 1405  03/18/14 1707 03/18/14 2116 03/19/14 0739 03/19/14 1218  GLUCAP 117* 218* 161* 157* 165*    Microbiology: Results for orders placed during the hospital encounter of 03/15/14  MRSA PCR SCREENING     Status: None   Collection Time    03/16/14  8:57 AM      Result Value Ref Range Status   MRSA by PCR NEGATIVE  NEGATIVE Final   Comment:            The GeneXpert MRSA Assay (FDA     approved for NASAL specimens     only), is one component of a     comprehensive MRSA colonization     surveillance program. It is not     intended to diagnose MRSA     infection nor to guide or     monitor treatment for     MRSA infections.    Coagulation Studies: No results found for this basename: LABPROT, INR,  in the last 72 hours  Urinalysis: No results found for this basename: COLORURINE, APPERANCEUR, LABSPEC, PHURINE, GLUCOSEU, HGBUR, BILIRUBINUR, KETONESUR, PROTEINUR, UROBILINOGEN, NITRITE, LEUKOCYTESUR,  in the last 72 hours    Imaging: No results found.   Medications:     . atorvastatin  40 mg Oral q1800  . bisacodyl  10 mg Rectal Daily  . calcitRIOL  0.25 mcg Oral Q48H  . calcitRIOL  0.5 mcg Oral Q48H  . carvedilol  37.5 mg Oral BID WC  . darbepoetin (ARANESP) injection - DIALYSIS  200 mcg Intravenous Q Thu-HD  . [START ON 03/27/2014] ferric gluconate (FERRLECIT/NULECIT) IV  62.5 mg Intravenous Weekly  . heparin subcutaneous  5,000 Units Subcutaneous 3 times per day  . hydrALAZINE  50 mg Oral TID  . insulin aspart  0-15 Units Subcutaneous TID WC  . insulin detemir  75 Units Subcutaneous BID   Or  . insulin detemir  38 Units Subcutaneous BID  . lip balm   Topical BID  . multivitamin  1 tablet Oral QHS  . polyethylene glycol  34 g Oral BID  . polyvinyl alcohol  1 drop Both Eyes TID  . sevelamer carbonate  800 mg Oral TID WC  . sodium chloride  3 mL Intravenous Q12H   acetaminophen, acetaminophen, albuterol, alum & mag hydroxide-simeth, diphenhydrAMINE,  HYDROcodone-acetaminophen, HYDROmorphone (DILAUDID) injection, magic mouthwash, menthol-cetylpyridinium, metoprolol, ondansetron (ZOFRAN) IV, ondansetron, phenol, polyethylene glycol, promethazine, sodium chloride, tiotropium  Assessment/ Plan:   1. Laparoscopic placement of PD catheter & ventral hernia repair - 6/22 per Dr. Johney Maine, ambulating as tolerated, pain improving. No BM  2. ESRD - HD on TTS @ [place],  3. Dialysis access - appt with Dr. Augustin Coupe 6/24 to evaluate poorly functioning catheter with limited BFR sec to arterial pressures, no problems reported on 6/23. Pt prefers Dr. Augustin Coupe, reschedule appt if necessary.  4. HTN/volume - BP 128/58 controlled 5. Anemia - Hgb down to 8.2 6. Sec HPT - Ca 9  , P 8  , iPTH 336; Renvela with meals.  7. Nutrition - Alb 3.3, tolerating solids. Add vitamin. 8. DM - Insulin per primary.  9. Hx CAD - s/p stents, on Plavix, Coreg.      LOS: 4 Lashawn Orrego,MARTIN W @TODAY @1 :40 PM

## 2014-03-19 NOTE — Evaluation (Signed)
Occupational Therapy Evaluation Patient Details Name: Jonathon Bailey MRN: [id number] DOB: 03-29-1956 Today's Date: 03/19/2014    History of Present Illness admitted for recurrent ventral incisional hernia s/p lap repair w mesh; laparoscopic insertion of continuous ambulatory peritoneal dialysis catheter 03/15/2014   Clinical Impression   Pt admitted with above.  Pt is overall supervision with functional mobility but fatigues easily.  Pt reports that he has a tub/shower.  Recommending tub seat to help minimize fall risk and assist with energy conservation.  Will continue to follow acutely in order to address below problem list.   Follow Up Recommendations  Home health OT    Equipment Recommendations  Tub/shower seat    Recommendations for Other Services       Precautions / Restrictions Precautions Precautions: None      Mobility Bed Mobility Overal bed mobility:  (not assessed- pt sitting EOB)                Transfers Overall transfer level: Modified independent Equipment used: Rolling walker (2 wheeled)                  Balance                                            ADL Overall ADL's : Needs assistance/impaired Eating/Feeding: Independent;Sitting   Grooming: Wash/dry face;Supervision/safety                   Toilet Transfer: Supervision/safety;Ambulation;Comfort height toilet   Toileting- Clothing Manipulation and Hygiene: Supervision/safety;Sit to/from stand       Functional mobility during ADLs: Supervision/safety;Rolling walker General ADL Comments: Pt c/o fatigue and provided little participation in ADL assessment.  Pt sitting EOB on OT arrival but agreeable to demo'ing toilet transfer for OT.       Vision                     Perception     Praxis      Pertinent Vitals/Pain C/o abdominal pain     Hand Dominance Right   Extremity/Trunk Assessment Upper Extremity Assessment Upper Extremity  Assessment: Generalized weakness           Communication Communication Communication: No difficulties   Cognition Arousal/Alertness: Awake/alert Behavior During Therapy: Flat affect Overall Cognitive Status: Within Functional Limits for tasks assessed                     General Comments       Exercises       Shoulder Instructions      Home Living Family/patient expects to be discharged to:: Private residence Living Arrangements: Children;Spouse/significant other Available Help at Discharge: Other (Comment) (wife with broken leg; 62 yo son at home)) Type of Home: House Home Access: Ramped entrance     Home Layout: Multi-level Alternate Level Stairs-Number of Steps: 14   Bathroom Shower/Tub: Teacher, early years/pre: Standard     Home Equipment: None          Prior Functioning/Environment Level of Independence: Independent        Comments: decreased endurance; unable to ambulate long distances    OT Diagnosis: Generalized weakness;Acute pain   OT Problem List: Decreased strength;Decreased activity tolerance;Pain;Decreased knowledge of use of DME or AE   OT Treatment/Interventions: Self-care/ADL training;DME and/or AE instruction;Therapeutic activities;Patient/family education  OT Goals(Current goals can be found in the care plan section) Acute Rehab OT Goals Patient Stated Goal: did not state OT Goal Formulation: With patient Time For Goal Achievement: 04/02/14 Potential to Achieve Goals: Good  OT Frequency: Min 2X/week   Barriers to D/C: Decreased caregiver support  wife with broken leg, unable to assist       Co-evaluation              End of Session Equipment Utilized During Treatment: Gait belt  Activity Tolerance: Patient limited by fatigue Patient left: in bed;with call bell/phone within reach   Time: 1330-1346 OT Time Calculation (min): 16 min Charges:  OT General Charges $OT Visit: 1 Procedure OT  Evaluation $Initial OT Evaluation Tier I: 1 Procedure OT Treatments $Self Care/Home Management : 8-22 mins G-Codes: OT G-codes **NOT FOR INPATIENT CLASS** Functional Assessment Tool Used: clinical judgment Functional Limitation: Self care Self Care Current Status [id number]): At least 1 percent but less than 20 percent impaired, limited or restricted Self Care Goal Status [id number]): 0 percent impaired, limited or restricted  Darrol Jump 03/19/2014, 3:19 PM  03/19/2014 Darrol Jump OTR/L Pager [phone] Office [phone] 03/19/2014 Darrol Jump OTR/L Pager [phone] Office [phone]

## 2014-03-19 NOTE — Progress Notes (Addendum)
[facility]  [address] [place], [place] [address] Phone: [phone] FAX: [phone]    Jonathon Bailey [id number] 1956/07/12  CARE TEAM:  PCP: Thersa Salt, DO  Outpatient Care Team: Patient Care Team: [name], DO as PCP - General (Pediatrics) [name], MD as Consulting Physician (Nephrology) [name], MD as Consulting Physician (Nephrology)  Inpatient Treatment Team: Treatment Team: Attending Provider: [name], MD; Consulting Physician: Louis Meckel, MD; Registered Nurse: Donzetta Matters, RN; Registered Nurse: July Dizon Pricilla Holm, RN; Technician: Army Chaco, NT; Registered Nurse: Candida Peeling, RN; Registered Nurse: Claiborne Billings, RN; Technician: Roger Shelter, NT; Registered Nurse: Wendall Mola, RN; Physical Therapist: Despina Pole, PT   Subjective:  Soreness mild - controlled w hydrocodone.  Wiped out by HD - ?catheter flow poor? Wife caretaker in fall w lower ext fx admitted.  Teenager at home only help  Tol solids Staying in bed yesterday.  Wiped out s/p 5.5 hrs in HD Walking to BR  Objective:  Vital signs:  Filed Vitals:   03/18/14 1250 03/18/14 1519 03/18/14 2119 03/19/14 0548  BP: 130/66 116/69 104/63 116/63  Pulse: 89 100 101 97  Temp: 98.6 F (37 C) 97.9 F (36.6 C) 98.4 F (36.9 C) 99.1 F (37.3 C)  TempSrc: Oral Oral Oral Oral  Resp: _0 Height:      Weight: 287 lb 4.2 oz (130.3 kg)     SpO2: 93% 95% 94% 93%    Last BM Date: 03/15/14  Intake/Output   Yesterday:  06/25 0701 - 06/26 0700 In: 680 [P.O.:680] Out: 3326  This shift:     Bowel function:  Flatus: Yes  BM: n  Drain: clean CAPD catheter  Physical Exam:  General: Pt awake/alert/oriented x4 in no acute distress Eyes: PERRL, normal EOM.  Sclera clear.  No icterus Neuro: CN II-XII intact w/o focal sensory/motor deficits. Lymph: No head/neck/groin  lymphadenopathy Psych:  No delerium/psychosis/paranoia HENT: Normocephalic, Mucus membranes moist.  No thrush Neck: Supple, No tracheal deviation Chest:  No chest wall pain w good excursion.  R infraclavicular HD cathter site clean CV:  Pulses intact.  Regular rhythm MS: Normal AROM mjr joints.  No obvious deformity Abdomen: Soft.  Nondistended.  Dressings c/d/i.  Mild tender at central incisions only.  Binder in place.  No evidence of peritonitis.  No incarcerated hernias. Ext:  SCDs BLE.  No mjr edema.  No cyanosis Skin: No petechiae / purpura   Problem List:   Principal Problem:   Recurrent ventral incisional hernia s/p lap repair w mesh 03/15/2014 Active Problems:   Diabetes mellitus type 2 with complications   OBSTRUCTIVE SLEEP APNEA   ESSENTIAL HYPERTENSION   Chronic combined systolic and diastolic heart failure   ESRD (end stage renal disease) on dialysis   COPD (chronic obstructive pulmonary disease)   Morbid obesity   CAPD (continuous ambulatory peritoneal dialysis) status   Assessment  Jonathon Bailey  58 y.o. male  4 Days Post-Op  Procedure(s): PROCEDURE: Procedure(s):  LAPAROSCOPIC LYSIS OF ADHESIONS X 15 MIN  LAPARASCOPIC INCISIONAL HERNIA REPAIR WITH MESH  LAPAROSCOPIC OMENTOPEXY  LAPAROSCOPIC RESCTION OF EPIPLOIC APPENDAGES  LAPAROSCOPIC INSERTION CONTINUOUS AMBULATORY PERITONEAL DIALYSIS CATHETER   OK  Plan:  -placement - set up SNF for help since caregiver now injured -solid diet -HD catheter management per nephrology.  ?need to replace/reevaluate? -bowel regimen - inc Miralax -pain controlled better w  hydrocodone/tylenol/ice/heat -HD qTTSat -DM control OK.  Levimir/SSI.  Got low yest AM then high.  Hgb A1C 7 = not poor control.  Continue regimen & follow -VTE prophylaxis- SCDs, etc -mobilize as tolerated to help recovery.  Rec HH PT at least.  Pt does not want to consider SNF.  Will try to make sure he has support -restart Plavix if no plans to  change catheter this admission - will try to d/w nephrology  D/C patient from hospital when patient meets criteria (anticipate later today):  Tolerating oral intake well Ambulating in walkways Adequate pain control without IV medications HH PT arranged   [name], M.D., F.A.C.S. Gastrointestinal and Minimally Invasive Surgery [facility] [address], [place], [place] [address] [phone] Main / Paging   03/19/2014   Results:   Labs: Results for orders placed during the hospital encounter of 03/15/14 (from the past 48 hour(s))  GLUCOSE, CAPILLARY     Status: Abnormal   Collection Time    03/17/14  7:38 AM      Result Value Ref Range   Glucose-Capillary 110 (*) 70 - 99 mg/dL  GLUCOSE, CAPILLARY     Status: None   Collection Time    03/17/14 11:51 AM      Result Value Ref Range   Glucose-Capillary 83  70 - 99 mg/dL  GLUCOSE, CAPILLARY     Status: Abnormal   Collection Time    03/17/14  5:17 PM      Result Value Ref Range   Glucose-Capillary 149 (*) 70 - 99 mg/dL  GLUCOSE, CAPILLARY     Status: Abnormal   Collection Time    03/17/14  9:43 PM      Result Value Ref Range   Glucose-Capillary 120 (*) 70 - 99 mg/dL   Comment 1 Notify RN    GLUCOSE, CAPILLARY     Status: Abnormal   Collection Time    03/18/14  7:54 AM      Result Value Ref Range   Glucose-Capillary 58 (*) 70 - 99 mg/dL  GLUCOSE, CAPILLARY     Status: None   Collection Time    03/18/14  8:17 AM      Result Value Ref Range   Glucose-Capillary 95  70 - 99 mg/dL  CBC     Status: Abnormal   Collection Time    03/18/14  8:51 AM      Result Value Ref Range   WBC 11.2 (*) 4.0 - 10.5 K/uL   RBC 2.84 (*) 4.22 - 5.81 MIL/uL   Hemoglobin 8.2 (*) 13.0 - 17.0 g/dL   HCT 26.3 (*) 39.0 - 52.0 %   MCV 92.6  78.0 - 100.0 fL   MCH 28.9  26.0 - 34.0 pg   MCHC 31.2  30.0 - 36.0 g/dL   RDW 13.9  11.5 - 15.5 %   Platelets 185  150 - 400 K/uL  RENAL FUNCTION PANEL      Status: Abnormal   Collection Time    03/18/14  8:51 AM      Result Value Ref Range   Sodium 137  137 - 147 mEq/L   Potassium 4.7  3.7 - 5.3 mEq/L   Chloride 95 (*) 96 - 112 mEq/L   CO2 24  19 - 32 mEq/L   Glucose, Bld 100 (*) 70 - 99 mg/dL   BUN 60 (*) 6 - 23 mg/dL   Creatinine, Ser 11.15 (*) 0.50 - 1.35  mg/dL   Calcium 9.0  8.4 - 10.5 mg/dL   Phosphorus 8.0 (*) 2.3 - 4.6 mg/dL   Albumin 3.3 (*) 3.5 - 5.2 g/dL   GFR calc non Af Amer 4 (*) >90 mL/min   GFR calc Af Amer 5 (*) >90 mL/min   Comment: (NOTE)     The eGFR has been calculated using the CKD EPI equation.     This calculation has not been validated in all clinical situations.     eGFR's persistently <90 mL/min signify possible Chronic Kidney     Disease.  GLUCOSE, CAPILLARY     Status: Abnormal   Collection Time    03/18/14  2:05 PM      Result Value Ref Range   Glucose-Capillary 117 (*) 70 - 99 mg/dL  GLUCOSE, CAPILLARY     Status: Abnormal   Collection Time    03/18/14  5:07 PM      Result Value Ref Range   Glucose-Capillary 218 (*) 70 - 99 mg/dL   Comment 1 Notify RN    GLUCOSE, CAPILLARY     Status: Abnormal   Collection Time    03/18/14  9:16 PM      Result Value Ref Range   Glucose-Capillary 161 (*) 70 - 99 mg/dL   Comment 1 Notify RN      Imaging / Studies: No results found.  Medications / Allergies: per chart  Antibiotics: Anti-infectives   Start     Dose/Rate Route Frequency Ordered Stop   03/16/14 0800  vancomycin (VANCOCIN) 1,500 mg in sodium chloride 0.9 % 500 mL IVPB     1,500 mg 250 mL/hr over 120 Minutes Intravenous  Once 03/16/14 0747 03/16/14 1302   03/15/14 1430  ceFAZolin (ANCEF) IVPB 1 g/50 mL premix     1 g 100 mL/hr over 30 Minutes Intravenous Every 6 hours 03/15/14 1311 03/16/14 0419   03/15/14 0600  ceFAZolin (ANCEF) 3 g in dextrose 5 % 50 mL IVPB     3 g 160 mL/hr over 30 Minutes Intravenous On call to O.R. 03/14/14 1311 03/15/14 0736       Note: This dictation was  prepared with voice recognition software technology. In this process, transcriptional errors may occur.  Attempts are made to proofread & provide accurate documentation.  Any errors are unintentional.

## 2014-03-23 ENCOUNTER — Telehealth (INDEPENDENT_AMBULATORY_CARE_PROVIDER_SITE_OTHER): Payer: Self-pay

## 2014-03-23 NOTE — Telephone Encounter (Signed)
Called pt to check on him today since discharged from hospital on Friday. The pt is doing ok. The pt goes today at 4:00 to the dialysis ctr to have catheter flushed. I made the pt a f/u appt with Dr Johney Maine for 04/07/14.

## 2014-03-29 ENCOUNTER — Telehealth (INDEPENDENT_AMBULATORY_CARE_PROVIDER_SITE_OTHER): Payer: Self-pay | Admitting: General Surgery

## 2014-03-29 DIAGNOSIS — Z9889 Other specified postprocedural states: Principal | ICD-10-CM

## 2014-03-29 DIAGNOSIS — Z8719 Personal history of other diseases of the digestive system: Secondary | ICD-10-CM

## 2014-03-29 MED ORDER — HYDROCODONE-ACETAMINOPHEN 7.5-325 MG PO TABS: 1.0000 | ORAL_TABLET | ORAL | Status: DC | PRN

## 2014-03-29 NOTE — Addendum Note (Signed)
Addended byFlossie Buffy on: 03/29/2014 04:12 PM   Modules accepted: Orders

## 2014-03-29 NOTE — Telephone Encounter (Signed)
OK to refill pain meds, esp for [facility] repair.  #50 of 7/5/325 hydrocodone.  Also make sure that the pt has gotten his CAPD catheter checked by the [facility] nurses [phone] is their # if he hasn't)

## 2014-03-29 NOTE — Telephone Encounter (Signed)
Spoke with patient and informed him that Dr. Heath Gold his refill.  Also checked in with him to make sure that he has had his CAPD flushed with the home health [facility] nurses.  He explained he has seen them once and it flushed well and he has another appt with them next Wednesday at 3:00.

## 2014-03-29 NOTE — Telephone Encounter (Signed)
Patient called in wanting a refill on his pain medication.  He rates his pain at a 3 or 4 on a 0-10 scale and its located at the incision sites from his lap incisional hernia repair and Pd cath placement.  Explained that it is a constant nagging pain.  He denies fever, chills, N/V, and last BM was 03/29/14.  Explained to him that Dr. Johney Maine is not in the office but this request would be sent to him and we will get back in touch with him within 24 hours.  Patient was displeased with this answer.  Informed him he could take up to 800 mg of ibuprofen and he should use ice packs to help.  appt info was also given to the patient.

## 2014-04-07 ENCOUNTER — Encounter (INDEPENDENT_AMBULATORY_CARE_PROVIDER_SITE_OTHER): Payer: Self-pay | Admitting: Surgery

## 2014-04-07 ENCOUNTER — Ambulatory Visit (INDEPENDENT_AMBULATORY_CARE_PROVIDER_SITE_OTHER): Payer: Medicare Other | Admitting: Surgery

## 2014-04-07 VITALS — BP 124/80 | HR 90 | Temp 97.5°F | Ht 71.0 in | Wt 291.0 lb

## 2014-04-07 DIAGNOSIS — Z992 Dependence on renal dialysis: Secondary | ICD-10-CM

## 2014-04-07 DIAGNOSIS — N186 End stage renal disease: Secondary | ICD-10-CM

## 2014-04-07 DIAGNOSIS — K432 Incisional hernia without obstruction or gangrene: Secondary | ICD-10-CM

## 2014-04-07 DIAGNOSIS — Z8719 Personal history of other diseases of the digestive system: Secondary | ICD-10-CM

## 2014-04-07 DIAGNOSIS — Z9889 Other specified postprocedural states: Secondary | ICD-10-CM

## 2014-04-07 MED ORDER — HYDROCODONE-ACETAMINOPHEN 7.5-325 MG PO TABS: 1.0000 | ORAL_TABLET | Freq: Four times a day (QID) | ORAL | Status: DC | PRN

## 2014-04-07 NOTE — Patient Instructions (Signed)
OR FINDINGS:  It is a long tunneled CAPD peritoneal dialysis catheter ([employer] [phone] curl cath with titanium extender) . The curl tip of the catheter rests in the deep pelvis. Entry into the peritoneum it is in the left suprapubic region just above the dome of the bladder. Deepest cuff in the periumbilical paramedian rectus muscle (lateral due to diastasis & periumbilical mesh) just deep to the fascia location. The exit site of the catheter on the skin is in the left upper abdomen subcostal region, 7 cm inferior to costal ridge, lateral clavicular line. Blue adapter CAPD extension tubing attached.   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 Incarcerated with omentum.  Type of repair - Laparoscopic underlay repair  Name of mesh - Bard Ventralight dual sided (polypropylene / Seprafilm)  Size of mesh - Length 23 cm, Width 17 cm  Mesh overlap - 5-7 cm  Placement of mesh - Intraperitoneal underlay repair   PERITONEAL DIALYSIS (CAPD) CATHETER PLACEMENT:  POST OPERATIVE INSTRUCTIONS  FOLLOW UP with the Peritoneal Dialysis Nurses  [address], [place],  [address]  Call [phone] or your neprologist to help arrange training/flushes of your CAPD catheter   The CAPD nurses & Nephrology usually follow you closely, making the need for follow-up in the [facility] surgery office redundant and therefore not always needed.  If they or you have concerns, please call us for possible follow-up in our office  1. Do NOT shower until dialysis nursing staff have advised.  Do NOT submerge in a bathtub or hot tub. 2. Your Home Therapy RN will advise and educate you on showering, bathing, and swimming when you are in training. 3. The Peritoneal Dialysis nurse will remove your waterproof bandages in the Dialysis Center a few days after surgery.  Do not remove the bandages until seen by them.  If you dressing becomes wet, saturated, or falls off call your  Home Therapy RN. 4. ACTIVITIES as tolerated:   a. You may resume regular (light) daily activities beginning the next day-such as daily self-care, walking, climbing stairs-gradually increasing activities as tolerated.  If you can walk 30 minutes without difficulty, it is safe to try more intense activity such as jogging, treadmill, bicycling, low-impact aerobics,  etc. b. No swimming within the 1st month of catheter placement.  You must have a Dr's order. c. Save the most intensive and strenuous activity for last such as sit-ups, heavy lifting, contact sports, etc  Refrain from any heavy lifting or straining until you are off narcotics for pain control.   d. DO NOT PUSH THROUGH PAIN.  Let pain be your guide: If it hurts to do something, don't do it.  Pain is your body warning you to avoid that activity for another week until the pain goes down. e. You may drive when you are no longer taking prescription pain medication, you can comfortably wear a seatbelt, and you can safely maneuver your car and apply brakes. f. You may have sexual intercourse when it is comfortable.  g. Be sure your catheter is taped to Your abdomen nor injured. Did not allow catheter to ankle, or tension on the catheter-it may cause damage to skin over the catheter h. You will be instructed on what to do an emergency, and will have access to on call our in 24 hours a day. The number to reach the home therapy nurse as listed below. 5. DIET: Follow a light bland diet  the first 24 hours after arrival home, such as soup, liquids, crackers, etc.  Be sure to include lots of fluids daily.  Avoid fast food or heavy meals as your are more likely to get nauseated.   6. Take your usually prescribed home medications unless otherwise directed. 7. PAIN CONTROL: a. Pain is best controlled by a usual combination of three different methods TOGETHER: i. Ice/Heat ii. Tylenol (over the counter pain medication) iii. Prescription pain  medication b. Most patients will experience some swelling and bruising around the incisions.  Ice packs or heating pads (30-60 minutes up to 6 times a day) will help. Use ice for the first few days to help decrease swelling and bruising, then switch to heat to help relax tight/sore spots and speed recovery.  Some people prefer to use ice alone, heat alone, alternating between ice & heat.  Experiment to what works for you.  Swelling and bruising can take several weeks to resolve.   c. It is helpful to take an over-the-counter pain medication regularly for the first few weeks.  Using acetaminophen (Tylenol, etc) 500-650mg  four times a day (every meal & bedtime) is usually safest since NSAIDs are not advisable in patients with kidney disease. d. A  prescription for pain medication (such as oxycodone, hydrocodone, etc) should be given to you upon discharge.  Take your pain medication as prescribed.  i. If you are having problems/concerns with the prescription medicine (does not control pain, nausea, vomiting, rash, itching, etc), please call us [phone] to see if we need to switch you to a different pain medicine that will work better for you and/or control your side effect better. ii. If you need a refill on your pain medication, please contact your pharmacy.  They will contact our office to request authorization. Prescriptions will not be filled after 5 pm or on week-ends. 8. Avoid getting constipated.  Between the surgery and the pain medications, it is common to experience some constipation.  Increasing fluid intake and taking a fiber supplement (such as Metamucil, Citrucel, FiberCon, MiraLax, etc) 1-2 times a day regularly will usually help prevent this problem from occurring.  A mild laxative (prune juice, Milk of Magnesia, MiraLax, etc) should be taken according to package directions if there are no bowel movements after 48 hours.       FOLLOW UP: Peritoneal Dialysis nurses  [address],  [place], [place] [address]  Call [phone] or your neprologist to help arrange training/flushes of your CAPD catheter    -The CAPD nurses & Nephrology usually follow you closely, making the need for follow-up in our office redundant and therefore not needed.  If they or you have concerns, please call us for possible follow-up in our office   -Please call [facility] at [phone] only as needed.  WHEN TO CALL us [phone]: 1. Poor pain control 2. Reactions / problems with new medications (rash/itching, nausea, etc)  3. Fever over 101.5 F (38.5 C) 4. Worsening swelling or bruising 5. Continued bleeding from incision. 6. Increased pain, redness, or drainage from the incision   The clinic staff is available to answer your questions during regular business hours (8:30am-5pm).  Please don't hesitate to call and ask to speak to one of our nurses for clinical concerns.   If you have a medical emergency, go to the nearest emergency room or call 911.  A surgeon from [facility] is always on call at the hospitals  9. IF YOU HAVE DISABILITY  OR FAMILY LEAVE FORMS, BRING THEM TO THE OFFICE FOR PROCESSING.  DO NOT GIVE THEM TO YOUR DOCTOR.  [facility], [place], [place] [address] [place], [place], [place]  [address] ? MAIN: [phone] ? TOLL FREE: [phone] ?  FAX [phone] [email or web address]  Peritoneal Dialysis - An Overview Dialysis can be done using a machine outside of the body (hemodialysis). Or, it can be done inside the body (peritoneal dialysis). The word "peritoneal" refers to the lining or membrane of the belly (abdominal cavity). The peritoneal membrane is a thin, plastic-like lining inside the belly that covers the organs and fits in the abdominal or peritoneal cavity, such as the stomach, liver and the kidneys. This lining works like a filter. It will allow certain things to pass from your blood through the lining and into a special  solution that has been placed into your belly. In this type of dialysis, the peritoneum is used to help clean the blood.  If you need dialysis, your kidneys are not working right. Healthy kidneys take out extra water and waste products, which becomes urine. When the kidneys do not do this, serious problems can develop. The waste and water build up in the blood. Your hands and feet might swell. You may feel tired, weak or sick to your stomach. Also, your blood pressure may rise. If not treated, you could die. Dialysis is a treatment that does the work that your kidneys would do if they were healthy.  It cleans your blood.   It will make sure your body has the right amount of certain chemicals that it needs. They include potassium, sodium and bicarbonate.   It will help control your blood pressure.  UNDERSTANDING PERITONEAL DIALYSIS  Here is how peritoneal dialysis works:   First, you will have surgery to put a soft plastic tube (catheter) into your belly (abdomen). This will allow you to easily connect yourself to special tubing, which will then let a special dialysis solution to be placed into your abdomen.   For each treatment, you will need at least one bag of dialysis solution (a liquid called dialysate). It is a mix of water that is pure and free of germs (sterile), sugar (dextrose) and the nutrients and minerals found in your blood. Sometimes, more than one bag is needed to get the right amount of fluid for your abdomen. Your caregiver will explain what size and how many bags you will need.   The dialysate is slowly put through the catheter to fill the abdomen (called the peritoneal cavity). This dialysate will need to stay in your body for 3-4 hours. This is known as the dwell time.   The solution is working to clean the blood and remove wastes from your body. At the end of this time, the solution is drained from your body through tubing into an empty bag. It is then replaced with a fresh  dialysate.   The draining and replacing of the dialysate is called an exchange or cycle. The catheter is capped after each exchange. Once the solution is in your body, you are then free to do whatever you would like until the next exchange. Most people will need to do 4-5 exchanges each day.   There are two different methods that can be used.   Continuous ambulatory peritoneal dialysis (CAPD): You put the solution into your abdomen, cap your catheter and then go about your day. Several hours later, you reconnect to a tubing set up, drain  out the solution and then put more solution in. This is done several times a day. No machine is needed.   Continuous cycler-assisted peritoneal dialysis (CCPD): A machine is used, which fills the abdomen with dialysate and then drains it. This happens several times. It usually is done at night while you are sleeping. When you wake up, you can disconnect from the machine and are free to go to go about your day.  PREPARING FOR EXCHANGES  Discuss the details of the procedure with your caregivers. You will be working with a nurse who is specially trained in doing dialysis. Make sure you understand:   How to do an exchange.   How much solution you need.   What type of solution you will need.   How often you should do an exchange. Ask:   How many times each day?   When? At meals? At bedtime?   Always keep the dialysate bags and other supplies in a cool, clean and dry place.   Keeping everything clean is very important.   The catheter and its cap must be free from germs (sterile)   The adapter also must be sterile. It attaches the dialysis bag and tubing to the catheter.   Clean the area of your body around the catheter every day. Use a chemical that fights infection (antiseptic).   Wash your hands thoroughly before starting an exchange.   You may be taught to wear a mask to cover your nose and mouth. This makes infection less likely to happen.   You  may be taught to close doors, windows and turn off any fans before doing an exchange.   Check the dialysate bag very carefully.   Make sure it is the right size bag for you. This information is on the label.   Also, make sure it is the right mixture. For some people, the dialysate contents vary. For instance, the mixture might be a stronger solution for overnight.   Check the expiration date (the last date you can use the bag). It also is on the label. If the date has gone by, throw away the bag.   The solution should be clear. You should be able to see any writing on the side of the bag clearly through the solution. Do not use a cloudy solution.   Gently squeeze the bag to make sure there are no leaks.   Use a dry heating pad to warm the dialysate in the bag. Leave the cover on the bag while you do this.   This is for comfort. You can skip this step if you want.   Never place the bag of solution under warm or hot water. Water from a faucet is not sterile and could cause germs to get into the bag. Infection could then result.  PERFORMING AN EXCHANGE  For continuous ambulatory dialysis:   Attach the dialysis bag and tubing to your catheter. Hang the bag so that gravity (the natural downward pull) draws the solution down and into your abdomen once the clamps are opened. This should take about 10 minutes.   Remove the bag and tubing from the catheter. Cap the catheter.   The solution stays in the abdomen for 3-4 hours (dwell time). The solution is working to clean the blood and remove wastes from your body.   When you are ready to drain the solution for another exchange, take the cap off the catheter. Then, attach the catheter to tubing, which is attached to  an empty bag. Place this empty bag below the abdomen or on the floor or stool and undo the clamps.   Gravity helps pull the fluid out of the abdomen and into the bag. The fluid in the bag may look yellow and clear, like urine. It  usually takes about 20 minutes to drain the fluid out of the abdomen.   When the solution has drained, start the process again by infusing a new bag of dialysate and then capping the catheter.   This should continue until you have used all of the solution that you are to use each day.   Sometimes, a small machine is used overnight. It is called a mini-cycler. This is done if the body cannot go all night without an exchange. The machine lets you sleep without having to get up and do an exchange.   For continuous cycler-assisted dialysis:   You will be taught how to set up or program your machine.   When you are ready for bed, put the dialysate bags onto the cycler machine. Put on exactly the number of bags that your caregiver said to use.   Connect your catheter to the machine and turn the cycler machine on.   Overnight, the cycler will do several exchanges. It often does three to five, sometimes more.   Solution that is in your abdomen in the morning will stay during the day. The machine is set to make the daytime solution stronger, if that is needed.   In the morning, you will disconnect from the machine and cap your catheter and go about your day.   Sometimes, an extra exchange is done during the day. This may be needed to remove excess waste or fluid.  IMPORTANT REMINDERS  You will need to follow a very strict schedule. Every step of the dialysis procedure must be done every day. Sometimes, several times a day. Altogether, this might take an extra 2 hours or more. However, you must stick to the routine. Do not skip a day. Do not skip a procedure.   Some people find it helpful to work with a Social worker or Education officer, museum in addition to the renal (kidney) nurse. They can help you figure out how to change your daily routine to fit in the dialysis sessions.   You may need to change your diet. Ask your caregiver for advice, or talk with a nutritionist about what you should and should not eat.    You will need to weigh yourself every day and keep track of what your weight is.   You may be taught how to check your blood pressure before every exchange. Your blood pressure reading will help determine what type of solution to use. If your blood pressure is too high, you may need a stronger solution.  RISKS AND COMPLICATIONS  Possible problems vary, depending on the method you use. Your overall health also can have an effect. Problems that could develop because of dialysis include:  Infection. This is the most common problem. It could occur:   In the peritoneum. This is called peritonitis.   Around the catheter.   Weight gain. The dialysate contains a type of sugar known as dextrose. Dextrose has a lot of calories. The body takes in several hundred calories from this sugar each day.   Weakened muscles in the abdomen. This can result from all of the fluid that your body has to hold in the abdomen.   Catheter replacement. Sometimes, a new one has  to be put in.   Change in dialysis method. Due to some complications, you may need to change to hemodialysis for a short time and have your dialysis done at a center.   Trouble adjusting to your new lifestyle. In some people, this leads to depression.   Sleep problems.   Dialysis-related amyloidosis. This sometimes occurs after 5 years of dialysis. Protein builds up in the blood. This can cause painful deposits on bones, joints and tendons (which connect muscle to bone). Or, it can cause hollow spots in bones that make them more likely to break.   Excess fluid. Your body may absorb too much of the fluid that is held in the abdomen. This can lead to heart or lung problems.  SEEK MEDICAL CARE IF:   You have any problems with an exchange.   The area around the catheter becomes red or painful.   The catheter seems loose, or it feels like it is coming out.   A bag of dialysate looks cloudy. Or, the liquid is an unusual color.   Abdominal  pain or discomfort.   You feel sick to your stomach (nauseous) or throw up (vomit).   You develop a fever of more than 102 F (38.9 C).  SEEK IMMEDIATE MEDICAL CARE IF:  You develop a fever of more than 102 F (38.9 C). Document Released: 07/08/2009 Document Revised: 08/30/2011 Document Reviewed: 07/08/2009 [facility] Patient Information 2012 [place].  Diet for Peritoneal Dialysis This diet may be modified in protein, sodium, phosphorus, potassium, or fluid, depending on your needs. The goals of nutrition therapy are similar to those for patients on hemodialysis. Providing enough protein to replace peritoneal losses is a priority. USES OF THIS DIET The diet is designed for the patient with end-stage kidney (renal) disease, who is treated by peritoneal dialysis. Treatment options include:  Continuous Ambulatory Peritoneal Dialysis (CAPD): Usually 4 exchanges of 1.5 to 2 liter volumes of glucose (sugar) and electrolyte-containing dialysate.   Continuous Cyclic Peritoneal Dialysis (CCPD): Essentially a reversal of CAPD, with shorter exchanges at night and a longer one during the day.   Intermittent Peritoneal Dialysis (IPD): 10 to 12 hours of exchanges, 2 to 3 times weekly.  ADEQUACY The diet may not meet the Recommended Dietary Allowances of the [employer] for calcium and ascorbic acid. Protein and water-soluble vitamin needs may be increased because of losses into the dialysate. Recommended daily supplements are the same as for hemodialysis patients. ASSESSMENT/DETERMINATION OF DIET Dietary needs will differ between patients. Parameters must be individualized. Protein  Guidelines: 1.2 to 1.3 gm/kg/day OR 1.5 gm/kg/day if patient is malnourished, catabolic, or has a protracted episode of peritonitis. A minimum of 50% of the protein intake should be of high biological value.   Goals: Meet protein requirements and replace dialysate losses while avoiding excessive  accumulation of waste products. Achieve serum albumin greater than 3.5 g/dL.   Evaluate: Current nutritional status, serum albumin and BUN levels, presence of peritonitis.  Sodium  Guidelines: Usually 90 to 175 mEq (2000 to 4000 mg), but should be individualized.   Goals: Minimize complications of fluid imbalance.   Evaluate: Weight, blood pressure regulation, and presence of swelling (edema).  Potassium  Guidelines: Individualized; often not restricted, and may need to be supplemented.   Goals: Serum K+ levels between 4.0 to 5.0 mEq/L.   Evaluate: Serum K+ levels, usual intake of K+, appetite.  Phosphorus  Guidelines: 800 to 1200 mg/day (the high protein intake results in a high  obligatory P intake).   Goal: Serum P levels between 4.5 to 6.0 mg/dL.   Evaluate: Serum P levels, usual P intake, P-binding medications: type, number, dosage, distribution.  Fluids  Guidelines: Individualized - may not be restricted for all patients.   Goal: Minimize complications of fluid imbalance.   Evaluate: Weight, blood pressure regulation, sodium intake, and presence of edema.  Document Released: 09/10/2005 Document Revised: 08/30/2011 Document Reviewed: 12/03/2006 [facility] Patient Information 2012 [place].  HERNIA REPAIR: POST OP INSTRUCTIONS  9. DIET: Follow a light bland diet the first 24 hours after arrival home, such as soup, liquids, crackers, etc.  Be sure to include lots of fluids daily.  Avoid fast food or heavy meals as your are more likely to get nauseated.  Eat a low fat the next few days after surgery. 10. Take your usually prescribed home medications unless otherwise directed. 11. PAIN CONTROL: a. Pain is best controlled by a usual combination of three different methods TOGETHER: i. Ice/Heat ii. Over the counter pain medication iii. Prescription pain medication b. Most patients will experience some swelling and bruising around the hernia(s) such as the bellybutton,  groins, or old incisions.  Ice packs or heating pads (30-60 minutes up to 6 times a day) will help. Use ice for the first few days to help decrease swelling and bruising, then switch to heat to help relax tight/sore spots and speed recovery.  Some people prefer to use ice alone, heat alone, alternating between ice & heat.  Experiment to what works for you.  Swelling and bruising can take several weeks to resolve.   c. It is helpful to take an over-the-counter pain medication regularly for the first few weeks.  Choose one of the following that works best for you: i. Naproxen (Aleve, etc)  Two 220mg  tabs twice a day ii. Ibuprofen (Advil, etc) Three 200mg  tabs four times a day (every meal & bedtime) iii. Acetaminophen (Tylenol, etc) 325-650mg  four times a day (every meal & bedtime) d. A  prescription for pain medication should be given to you upon discharge.  Take your pain medication as prescribed.  i. If you are having problems/concerns with the prescription medicine (does not control pain, nausea, vomiting, rash, itching, etc), please call us [phone] to see if we need to switch you to a different pain medicine that will work better for you and/or control your side effect better. ii. If you need a refill on your pain medication, please contact your pharmacy.  They will contact our office to request authorization. Prescriptions will not be filled after 5 pm or on week-ends. 12. Avoid getting constipated.  Between the surgery and the pain medications, it is common to experience some constipation.  Increasing fluid intake and taking a fiber supplement (such as Metamucil, Citrucel, FiberCon, MiraLax, etc) 1-2 times a day regularly will usually help prevent this problem from occurring.  A mild laxative (prune juice, Milk of Magnesia, MiraLax, etc) should be taken according to package directions if there are no bowel movements after 48 hours.   [place] / shower every day.  You may shower over the dressings  as they are waterproof.   14. Remove your waterproof bandages 5 days after surgery.  You may leave the incision open to air.  You may replace a dressing/Band-Aid to cover the incision for comfort if you wish.  Continue to shower over incision(s) after the dressing is off.    15. ACTIVITIES as tolerated:   a. You may  resume regular (light) daily activities beginning the next day-such as daily self-care, walking, climbing stairs-gradually increasing activities as tolerated.  If you can walk 30 minutes without difficulty, it is safe to try more intense activity such as jogging, treadmill, bicycling, low-impact aerobics, swimming, etc. b. Save the most intensive and strenuous activity for last such as sit-ups, heavy lifting, contact sports, etc  Refrain from any heavy lifting or straining until you are off narcotics for pain control.   c. DO NOT PUSH THROUGH PAIN.  Let pain be your guide: If it hurts to do something, don't do it.  Pain is your body warning you to avoid that activity for another week until the pain goes down. d. You may drive when you are no longer taking prescription pain medication, you can comfortably wear a seatbelt, and you can safely maneuver your car and apply brakes. e. Dennis Bast may have sexual intercourse when it is comfortable.  16. FOLLOW UP in our office a. Please call [facility] at [phone] to set up an appointment to see your surgeon in the office for a follow-up appointment approximately 2-3 weeks after your surgery. b. Make sure that you call for this appointment the day you arrive home to insure a convenient appointment time. 9.  IF YOU HAVE DISABILITY OR FAMILY LEAVE FORMS, BRING THEM TO THE OFFICE FOR PROCESSING.  DO NOT GIVE THEM TO YOUR DOCTOR.  WHEN TO CALL us [phone]: 7. Poor pain control 8. Reactions / problems with new medications (rash/itching, nausea, etc)  9. Fever over 101.5 F (38.5 C) 10. Inability to urinate 11. Nausea and/or vomiting 12. Worsening  swelling or bruising 13. Continued bleeding from incision. 14. Increased pain, redness, or drainage from the incision   The clinic staff is available to answer your questions during regular business hours (8:30am-5pm).  Please don't hesitate to call and ask to speak to one of our nurses for clinical concerns.   If you have a medical emergency, go to the nearest emergency room or call 911.  A surgeon from [facility] is always on call at the hospitals in [facility], [place], [place], [place], [place]  [address] ?  [address] [address], [place], [place]   [address] MAIN: [phone] ? TOLL FREE: [phone] ? FAX: [phone] [email or web address]  Peritoneal Dialysis Dialysis is a procedure that replaces some of the work healthy kidneys do. It is done when you lose about 85-90% of your kidney function, and sometimes earlier if your symptoms may be improved by dialysis. During dialysis, wastes, salt, and extra water are removed from the blood and the level of certain chemicals in the blood (such as potassium) is maintained. Peritoneal dialysis is a type of dialysis in which the thin lining of the abdomen (peritoneum) and a fluid called dialysate are used to do these tasks.  Before beginning peritoneal dialysis, you will have surgery to place a thin, plastic tube (catheter) in your abdomen. The catheter will be small, soft, and easy to conceal. It will be used to transfer a fluid called dialysate to and from your abdomen during each session. The surgery is usually done at least 2 weeks before you begin dialysis. HOW DOES PERITONEAL DIALYSIS WORK? At the start of a session, your abdomen is filled with dialysate. Dialysate contains a sugar that pulls wastes, salt, and extra water from the blood. These substances pass through the peritoneum and into the dialysate over the course of several  hours. At the end of the session, the dialysate is  drained from the body. The abdomen is then refilled with dialysate and the procedure is repeated. The process of draining and filling the abdomen with dialysate is called an exchange. The time the dialysate is in your body between exchanges is called a dwell. The dwell depends on the number of exchanges needed, the type of dialysate used, and the characteristics of the peritoneum. It usually varies from 1.5-3 hours. Peritoneal dialysis is done during the day or at night while you are sleeping. Peritoneal dialysis that is done during the day is called continuous ambulatory peritoneal dialysis (CAPD). In CAPD, exchanges are performed up to 5 times a day. Each exchange takes about 30-40 minutes. You may go about your day normally between exchanges. Peritoneal dialysis that is done at night while you are sleeping is called continuous cycling peritoneal dialysis (CCPD).In CCPD, a machine called a cycler performs exchanges while you are sleeping. Sometimes a combination of CAPD and CCPD is needed. RISKS AND COMPLICATIONS Generally, peritoneal dialysis is a safe procedure. However, as with any procedure, complications can occur. Possible complications include:  Infection in the peritoneum. This is the most common complication of peritoneal dialysis.  Infection around the site of catheter insertion.  Weakened abdominal muscles. This may lead to a hernia, which can in turn have complications if left untreated. PREPARING FOR AN EXCHANGE  Close doors and windows and turn off any fans before performing an exchange. Make sure you are in a space without drafts or air currents. Doing these things reduces your risk of infection.  Wear a mask to cover your nose and mouth. Do this before you wash your hands and handle equipment. Keep the mask on during each exchange.  Wash your hands thoroughly before each exchange. Use a gel or foam.  Make sure your catheter and its cap are sterile. If you are using a cycler, make  sure the device that attaches the dialysate bag and tubing to the catheter (adapter) is also sterile.  Make sure the area of your body around the catheter is sterile.  Check your blood pressure if directed by your health care provider. Your blood pressure reading may help determine what type of dialysate to use.  Examine the dialysate:  Make sure the bag is the right size and contains the correct mixture. Size information is on the label.   Gently squeeze the bag to make sure there are no leaks.   Look at the color of the dialysate. The dialysate should be clear. You should be able to see any writing on the side of the bag clearly through the solution. Do not use a cloudy solution.  Check the expiration date. The expiration date is on the label. If it is past the expiration date, throw the bag away.  Warm the dialysate using a dry heating pad. Leave the dialysate in the bag with the cover on while doing this. Do not use a microwave or hot water to warm the dialysate.  If you are using a cycler, make sure it is set up and programmed correctly. HOW TO PERFORM AN EXCHANGE The following steps illustrate how exchanges are commonly performed. The actual way in which an exchange should be performed varies depending on the equipment used and other factors. Always perform exchanges in exactly the way that your health care provider has trained you to. Remember to always wash your hands before touching any tubing. This is extremely important in  preventing infection. CAPD 1. Hang your bag of dialysate above the level of your abdomen on the IV pole. 2. Place a drain bag below your abdomen. 3. Pull out the ring of y-shaped tubing that connects both bags. 4. Uncap the tube that is connected to your catheter (transfer set) and immediately attach it to the y-shaped tubing of the dialysate and drain bags. 5. Twist open the clamp on the transfer set. This will cause the fluid in your abdomen to drain  through the tube that goes to the drain bag (drain line). It usually takes about 20 minutes for all of the fluid to drain. 6. Once the fluid has finished draining, twist lock the clamp of the transfer set. Then clamp the drain line. 7. Break the seal (frangible) on the tubing that is connected to the dialysate bag (fill line) and then unclamp the drain line. Air bubbles will flow into the drain line. Make sure your transfer set is still locked so that no air gets into the abdomen. 8. Count to 5 and then clamp the drain line. 9. Twist the transfer set clamp to open it and allow the dialysate to flow into your abdomen. This should take about 10 minutes. 10. Once the fill is complete, twist the transfer set clamp to lock it and then clamp the tubing of the fill line. 11. Detach the tubing from the catheter and immediately cap the catheter transfer set. Secure the transfer set to your abdomen wall using tape as instructed. 12. Inspect the drainage. The fluid may look like urine, but it should be clear. 13. Weigh the drain bag and record the volume drained. 14. Check your blood pressure, temperature, and pulse if it is the first exchange of the day. 15. Allow the solution to stay in the abdomen for as long as directed by your health care provider. While the solution is in your abdomen, you may go about your day. If your body cannot go all night without an exchange, a cycler may be used to perform exchanges while you sleep. The cycler is similar to the one used during CCPD but is smaller. Using a cycler allows you to sleep without having to get up and do an exchange. CCPD 1. When you are ready for bed, put the dialysate bags onto the cycler. Put on exactly the number of bags that your health care provider said to use.  2. Cyclers have a small cartridge (cassette) with tubing that attaches to each dialysate bag and the tube that goes to the patient (patient line). Depending on the number of dialysate bags  you are to use, all the tubes on the cassette may need to be connected to separate dialysate bags. If you use fewer bags than there are tubes on the cassette, the extra lines will need to be clamped. 3. Insert the cassette with the attached tubing into the front of the cycler. 4. Make sure that the drain line extends to the toilet. The tip of the drain line should not touch the water in the toilet bowl. 5. Pull the ring on the tubing from the dialysate bag on the heating pad of the cycler and attach it to the first tube of the cassette. Then break the frangible on the tubing of this dialysate bag. 6. Repeat this process with all the bags you need to use. 7. Start the cycler. This will prepare (prime) the cycler and tubing by filling it with dialysate and getting rid of all the  air in the tubes. 8. Once all lines are primed, the cycler will instruct you to connect yourself. 9. Remove the pull ring from the patient line with one hand. 10. Uncap your transfer set and attach it to the patient line. 11. Twist open the clamp on the transfer set. 12. Press "GO" on the cycler and it will begin the draining and filling process. The cycler may do 3-5 exchanges overnight, depending on what your health care provider recommended. 13. In the morning, record all volumes and times shown on the cycler. 14. Press "GO" on the cycler, which will then instruct you to close all clamps. 15. Close your transfer set twist clamp and then clamp all the tubes that go to the cycler. 16. When indicated by the cycler, disconnect the patient line from the transfer set and immediately re-cap your transfer set. 17. Secure the transfer set to your abdomen wall using tape as instructed. 18. Check your blood pressure, temperature, and pulse. The solution that is in your abdomen in the morning will stay there during the day. If the body cannot go all day without an exchange, you may need to perform an exchange during the day. Follow  the steps under CAPD if an extra exchange is needed. HOME CARE INSTRUCTIONS  Follow diet instructions as directed by your health care provider.  Avoid becoming constipated. Constipation prevents dialysate from draining effectively after a dwell. To prevent constipation, make sure you are eating fiber-rich foods and avoiding foods that cause constipation. Increasing your physical activity and going to the restroom when you feel you need to instead of holding it in can also prevent constipation. If your health care provider recommends drugs to prevent constipation such as laxatives, use them only as directed.  Always keep the dialysate bags and other supplies in a cool, clean, and dry place.  Keep a strict schedule. Dialysis must be done every day. Do not skip a day or an exchange. Make sure to make time for each exchange.  Weigh yourself every day. Sudden weight gain may be a sign of a problem.  Take medicines as directed by your health care provider. SEEK MEDICAL CARE IF:   You have a fever or chills.  You feel sick to your stomach (nauseous) or throw up (vomit).  You have diarrhea.  You have any problems with an exchange.   Your blood pressure increases.  You suddenly gain weight or feel short of breath.  The catheter seems loose or feels like it is coming out.   The fluid that has drained from your abdomen is pinkish or reddish. However, women having their menstrual period do not need to seek medical care if the fluid is only a little pink or red.  There are white strands in the dialysate that are large enough to get stuck in your tubing or catheter. SEEK IMMEDIATE MEDICAL CARE IF:   The area around the catheter swells or becomes red, tender, or painful.  There is pus coming from the catheter site.  The fluid that has drained from your abdomen is cloudy.  You feel abdominal pain or discomfort. Document Released: 07/08/2009 Document Revised: 09/15/2013 Document  Reviewed: 02/05/2013 [facility] Patient Information 2015 [place], [place]. This information is not intended to replace advice given to you by your health care provider. Make sure you discuss any questions you have with your health care provider.

## 2014-04-07 NOTE — Progress Notes (Signed)
Subjective:     Patient ID: Jonathon Bailey, male   DOB: February 26, 1956, 58 y.o.   MRN: [id number]  HPI  Note: Portions of this report may have been transcribed using voice recognition software. Every effort was made to ensure accuracy; however, inadvertent computerized transcription errors may be present.   Any transcriptional errors that result from this process are unintentional.       Jonathon Bailey  [birth year] [id number]  Patient Care Team: [name], DO as PCP - General (Pediatrics) [name], MD as Consulting Physician (Nephrology) [name], MD as Consulting Physician (Nephrology)  Procedure (Date: 03/15/2014):  POST-OPERATIVE DIAGNOSIS: end stage renal disease, dialysis dependant, incisional hernia  PROCEDURE: Procedure(s):  LAPAROSCOPIC LYSIS OF ADHESIONS X 15 MIN  LAPARASCOPIC INCISIONAL HERNIA REPAIRS x5 WITH MESH  LAPAROSCOPIC OMENTOPEXY  LAPAROSCOPIC RESCTION OF EPIPLOIC APPENDAGES (SIGMOID COLON)  LAPAROSCOPIC INSERTION CONTINUOUS AMBULATORY PERITONEAL DIALYSIS CATHETER  SURGEON: Surgeon(s):  [name], MD  ASSISTANT: RN    This patient returns for surgical re-evaluation.  He struggled with postoperative pain and took a few days to recover in hospital.  Followed by nephrology.  His wife fell and broke her ankle - She requires plating and surgery.  Really done with rehabilitation at [facility].  He still wanted to go home with a patchwork of family support.  He still has moderate soreness but is improving.  Narcotics help.  Constipation controlled with Dulcolax.  Getting flushes of his peritoneal dialysis catheter.  No fevers or chills.  Energy level getting better.  In good spirits overall.  Had many questions.    Patient Active Problem List   Diagnosis Date Noted  . CAPD (continuous ambulatory peritoneal dialysis) status 03/15/2014  . Morbid obesity 02/01/2014  . Recurrent ventral incisional hernia s/p lap repair w mesh 03/15/2014  02/01/2014  . Preventative health care 12/02/2013  . Other complications due to renal dialysis device, implant, and graft 10/24/2012  . COPD (chronic obstructive pulmonary disease) 10/23/2012  . DIABETIC PERIPHERAL NEUROPATHY 01/09/2010  . ESRD (end stage renal disease) on dialysis 08/30/2009  . CEREBROVASCULAR ACCIDENT, HX OF 08/30/2009  . Diabetes mellitus type 2 with complications [id number]  . HYPERLIPIDEMIA 09/21/2008  . OBSTRUCTIVE SLEEP APNEA 09/21/2008  . ESSENTIAL HYPERTENSION 09/21/2008  . CORONARY ARTERY DISEASE 09/21/2008  . Chronic combined systolic and diastolic heart failure Q000111Q    Past Medical History  Diagnosis Date  . Hyperlipidemia   . Hypertension   . Arthritis   . Anxiety   . Depression   . Chronic systolic heart failure Q000111Q    ECHO Feb 2013 showed LVEF low normal at 50-55%, +hypokinetic anterolateral wall and inferolateral wall.    . Obesity   . Macular edema   . Diabetic retinopathy   . Type II diabetes mellitus   . Myocardial infarction     status post MI x2 and 3 stents placed in 2003  . Asthma     "as a child"  . Sleep apnea     "sleep w/CPAP sometimes" (03/15/2014)  . [id number])     "maybe monthly" (03/15/2014)  . Stroke ~ 2007; ~1987    "weak on right side; messed w/right side of brain; cry all the time"  . CKD (chronic kidney disease) stage 4, GFR 15-29 ml/min 08/30/2009    Progressive renal failure since 2008, creatinine 1.2 in 2008 up to 3.5 in 2012 and 3.2-5.0 in 2013. All UA's 2011-13 showed >300 protein on dipstick. Work-up in May 2011 showed  negative Urine IFE and SPEP, ultrasound showed 12-13 cm kidneys with increased echogenicity and UPC ratio was 1.5 gm proteinuria.  Hgb A1C's from 2011 to 2013 were all between 9-11.  Patient saw Dr. Donnetta Hutching (vasc surgery) for HD access in Aug 2013 > vein mapping was done and Dr. Donnetta Hutching felt the left arm (pt is R handed) was suitable for L arm Cimino radiocephalic fistula. Patient said he  wasn't ready to consider doing dialysis and declined the surgery.     Marland Kitchen ESRD (end stage renal disease) on dialysis     Past Surgical History  Procedure Laterality Date  . Umbilical hernia repair    . Av fistula placement  10/17/2012    Procedure: ARTERIOVENOUS (AV) FISTULA CREATION;  Surgeon: Mal Misty, MD;  Location: [facility];  Service: Vascular;  Laterality: Left;  . Peritoneal catheter insertion  03/15/2014  . Hernia repair    . Incisional hernia repair  03/15/2014  . Laparoscopic lysis of adhesions  03/15/2014  . Coronary angioplasty with stent placement  ~ 2002    "3"  . Refractive surgery Bilateral   . Capd insertion N/A 03/15/2014    Procedure: LAPAROSCOPIC INSERTION CONTINUOUS AMBULATORY PERITONEAL DIALYSIS CATHETER, LAPARASCOPIC INCISIONAL HERNIA  REPAIR  WITH MESH, OMENTOPEXY AND LYSIS OF ADHESIONS;  Surgeon: [name], MD;  Location: [place];  Service: General;  Laterality: N/A;    History   Social History  . Marital Status: Divorced    Spouse Name: N/A    Number of Children: N/A  . Years of Education: N/A   Occupational History  . Not on file.   Social History Main Topics  . Smoking status: Former Smoker -- 1.00 packs/day for 15 years    Types: Cigarettes    Quit date: 09/24/1986  . Smokeless tobacco: Never Used  . Alcohol Use: Yes     Comment: stopped in 2013  . Drug Use: No  . Sexual Activity: Not Currently   Other Topics Concern  . Not on file   Social History Narrative  . No narrative on file    Family History  Problem Relation Age of Onset  . Asthma Mother   . Hyperlipidemia Mother   . Hypertension Mother   . Stroke Father   . Heart attack Father   . Prostate cancer Father   . Deep vein thrombosis Father   . Cancer Father   . Diabetes Father   . Hyperlipidemia Father   . Hypertension Father   . Other Father     varicose veins  . Heart disease Father     before age 1  . Other Sister     varicose veins    Current Outpatient  Prescriptions  Medication Sig Dispense Refill  . albuterol (PROVENTIL HFA;VENTOLIN HFA) 108 (90 BASE) MCG/ACT inhaler Inhale 2 puffs into the lungs every 4 (four) hours as needed for wheezing. For wheezing      . Artificial Tear Solution (SOOTHE HYDRATION OP) Place 1 drop into both eyes 3 (three) times daily.      Marland Kitchen atorvastatin (LIPITOR) 80 MG tablet Take 40 mg by mouth daily.      . calcitRIOL (ROCALTROL) 0.25 MCG capsule Take 0.25-0.5 mcg by mouth daily. Alternates takes 1 tablet and 2 capsules the next day      . carvedilol (COREG) 12.5 MG tablet Take 37.5 mg by mouth 2 (two) times daily with a meal.       . clopidogrel (PLAVIX) 75 MG tablet  Take 75 mg by mouth daily.      . hydrALAZINE (APRESOLINE) 50 MG tablet Take 50 mg by mouth 3 (three) times daily.      Marland Kitchen HYDROcodone-acetaminophen (NORCO) 7.5-325 MG per tablet Take 1-2 tablets by mouth every 4 (four) hours as needed for moderate pain or severe pain.  50 tablet  0  . insulin detemir (LEVEMIR) 100 UNIT/ML injection Inject 75 Units into the skin 2 (two) times daily.      . Insulin Pen Needle (B-D ULTRAFINE III SHORT PEN) 31G X 8 MM MISC Use with insulin administration.  100 each  0  . sevelamer carbonate (RENVELA) 800 MG tablet Take 800 mg by mouth 3 (three) times daily with meals.       . tiotropium (SPIRIVA) 18 MCG inhalation capsule Place 18 mcg into inhaler and inhale daily as needed.        No current facility-administered medications for this visit.     Allergies  Allergen Reactions  . Feraheme [Ferumoxytol] Itching    Severe itching.    BP 124/80  Pulse 90  Temp(Src) 97.5 F (36.4 C)  Ht 5\' 11"  (1.803 m)  Wt 291 lb (131.997 kg)  BMI 40.60 kg/m2  Dg Chest 2 View  03/15/2014   CLINICAL DATA:  58 year old male preoperative study for hernia repair. Shortness of breath, hypertension, diabetes, dialysis. Initial encounter.  EXAM: CHEST  2 VIEW  COMPARISON:  02/20/2013.  FINDINGS: Right chest tunneled dual lumen dialysis type  catheter now in place. Improved lung volumes from the comparison. No pneumothorax, pulmonary edema, pleural effusion or confluent pulmonary opacity. Stable cardiac size and mediastinal contours. Visualized tracheal air column is within normal limits. No acute osseous abnormality identified.  IMPRESSION: No acute cardiopulmonary abnormality.   Electronically Signed   By: Lars Pinks M.D.   On: 03/15/2014 08:06     Review of Systems  Constitutional: Negative for fever, chills and diaphoresis.  HENT: Negative for sore throat and trouble swallowing.   Eyes: Negative for photophobia and visual disturbance.  Respiratory: Negative for choking and shortness of breath.   Cardiovascular: Negative for chest pain and palpitations.  Gastrointestinal: Negative for nausea, vomiting, abdominal distention, anal bleeding and rectal pain.  Genitourinary: Negative for dysuria, urgency, difficulty urinating and testicular pain.  Musculoskeletal: Negative for arthralgias, gait problem, myalgias and neck pain.  Skin: Negative for color change and rash.  Neurological: Negative for dizziness, speech difficulty, weakness and numbness.  Hematological: Negative for adenopathy.  Psychiatric/Behavioral: Negative for hallucinations, confusion and agitation.       Objective:   Physical Exam  Constitutional: He is oriented to person, place, and time. He appears well-developed and well-nourished. No distress.  HENT:  Head: Normocephalic.  Mouth/Throat: Oropharynx is clear and moist. No oropharyngeal exudate.  Eyes: Conjunctivae and EOM are normal. Pupils are equal, round, and reactive to light. No scleral icterus.  Neck: Normal range of motion. No tracheal deviation present.  Cardiovascular: Normal rate, normal heart sounds and intact distal pulses.   Pulmonary/Chest: Effort normal. No respiratory distress.  Abdominal: Soft. He exhibits no distension. There is no tenderness. Hernia confirmed negative in the right inguinal  area and confirmed negative in the left inguinal area.  Regional dressing still on.  I removed them.  Incisions clean with normal healing ridges.  No hernias  Musculoskeletal: Normal range of motion. He exhibits no tenderness.  Neurological: He is alert and oriented to person, place, and time. No cranial nerve deficit. He exhibits  normal muscle tone. Coordination normal.  Skin: Skin is warm and dry. No rash noted. He is not diaphoretic.  Psychiatric: He has a normal mood and affect. His behavior is normal.       Assessment:     Recovering 3 weeks status post laparoscopic repair of Swiss cheese type incarcerated ventral hernias with omentum.  Also placement of peritoneal dialysis catheter.     Plan:     I discussed postop findings with him in detail.  Continue peritoneal dialysis catheter flushings.  Increase activity as tolerated to regular activity.  Low impact exercise such as walking an hour a day at least ideal.  Do not push through pain.  I reviewed his Norco one more time.  #40  Diet as tolerated.  Low fat high fiber diet ideal.  Bowel regimen with 30 g fiber a day and fiber supplement as needed to avoid problems.  Return to clinic as needed.   Instructions discussed.  Followup with primary care physician for other health issues as would normally be done.  Consider screening for malignancies (breast, prostate, colon, melanoma, etc) as appropriate.  Questions answered.  The patient expressed understanding and appreciation

## 2014-04-13 ENCOUNTER — Inpatient Hospital Stay (HOSPITAL_COMMUNITY): Admission: RE | Admit: 2014-04-13 | Payer: Medicare Other | Source: Ambulatory Visit

## 2014-04-13 ENCOUNTER — Other Ambulatory Visit (HOSPITAL_COMMUNITY): Payer: Self-pay | Admitting: *Deleted

## 2014-04-14 ENCOUNTER — Ambulatory Visit (HOSPITAL_COMMUNITY)
Admission: RE | Admit: 2014-04-14 | Discharge: 2014-04-14 | Disposition: A | Discharge status: Home / Self Care | Payer: Medicare Other | Source: Ambulatory Visit | Attending: Nephrology | Admitting: Nephrology

## 2014-04-14 DIAGNOSIS — N186 End stage renal disease: Secondary | ICD-10-CM | POA: Insufficient documentation

## 2014-04-14 DIAGNOSIS — D649 Anemia, unspecified: Secondary | ICD-10-CM | POA: Insufficient documentation

## 2014-04-14 LAB — ABO/RH: ABO/RH(D): O POS

## 2014-04-14 LAB — PREPARE RBC (CROSSMATCH)

## 2014-04-14 MED ORDER — ACETAMINOPHEN 325 MG PO TABS
ORAL_TABLET | ORAL | Status: AC
  Filled 2014-04-14: qty 1

## 2014-04-14 MED ORDER — DIPHENHYDRAMINE HCL 25 MG PO CAPS
25.0000 mg | ORAL_CAPSULE | Freq: Once | ORAL | Status: AC
  Administered 2014-04-14: 25 mg via ORAL

## 2014-04-14 MED ORDER — ACETAMINOPHEN 325 MG PO TABS
325.0000 mg | ORAL_TABLET | Freq: Once | ORAL | Status: AC
  Administered 2014-04-14: 325 mg via ORAL

## 2014-04-14 MED ORDER — DIPHENHYDRAMINE HCL 25 MG PO CAPS
ORAL_CAPSULE | ORAL | Status: AC
  Filled 2014-04-14: qty 1

## 2014-04-15 LAB — TYPE AND SCREEN
ABO/RH(D): O POS
Antibody Screen: NEGATIVE
UNIT DIVISION: 0
Unit division: 0

## 2014-04-20 ENCOUNTER — Encounter: Payer: Self-pay | Admitting: Gastroenterology

## 2014-04-27 ENCOUNTER — Telehealth (INDEPENDENT_AMBULATORY_CARE_PROVIDER_SITE_OTHER): Payer: Self-pay

## 2014-04-27 DIAGNOSIS — Z9889 Other specified postprocedural states: Principal | ICD-10-CM

## 2014-04-27 DIAGNOSIS — Z8719 Personal history of other diseases of the digestive system: Secondary | ICD-10-CM

## 2014-04-27 NOTE — Telephone Encounter (Signed)
Pt calling in b/c having discomfort below his belly button and to the right of his belly button after he strained himself when getting out of a car. The pt is a s/p lap incisional ventral hernia repair along with a CAPD insertion on 03/15/14. The pt is scared he has pulled something apart from surgery b/c the pain will not go away. The pt said it's constant pain that will go to a stabbing pain when he stands up from sitting. The has been taking Norco and Tylenol for the pain. The pt is supposed to start training for the CAPD cath next week but he is worried about the pain. I advised pt to use warm heat on the area continue the Norco. I advised pt that I would send message to Dr Johney Maine and get back with the pt tomorrow. The pt only has 7 tablets of Norco left on the rx. Please advise.

## 2014-04-27 NOTE — Telephone Encounter (Signed)
Most likely he pulled at his mesh.  Would do round-the-clock heat & Norco or Tylenol Q6H.  Do for the next 3 weeks.  Consider abdominal binder to help with counterpressure.  OK to start CAPD catheter flushes.  Pain should calm down.  I can see him in a week or so just to doublecheck.  Renew Norco #50 1-2 q6h PRN pain

## 2014-04-28 MED ORDER — HYDROCODONE-ACETAMINOPHEN 7.5-325 MG PO TABS: 1.0000 | ORAL_TABLET | Freq: Four times a day (QID) | ORAL | Status: DC | PRN

## 2014-04-28 NOTE — Telephone Encounter (Signed)
Called pt back to notify him that Dr Johney Maine thinks more likely he pulled at the mesh when getting in/out of the car. The pt needs to be doing round the clock heat, pain med.or Tylenol q 6h, and wearing the abdominal binder per DR Gross. The pt said that he would rather take the pain medication Norco 7.5mg  instead of the Tylenol but pt will need a refill of the Norco. I advised pt that I could the refill for Norco done today to be at the front desk for p/u in the next hour. Written Rx for Norco 7.5mg  #50 per Dr Johney Maine. I also advised pt that Dr Johney Maine said it would be to start the CAPD flushes next week that the pain should calm down. The pt would really like to be seen by Dr Johney Maine first before starting the flushes so he is going to call [facility] get them to push their start date back one week. I made the pt an appt to see Dr Johney Maine for 8/12 to be rechecked after doing the recommendations for a week. The pt would feel so much better seeing Dr Johney Maine next week.

## 2014-05-05 ENCOUNTER — Encounter (INDEPENDENT_AMBULATORY_CARE_PROVIDER_SITE_OTHER): Payer: Medicare Other | Admitting: Surgery

## 2014-05-12 ENCOUNTER — Ambulatory Visit (INDEPENDENT_AMBULATORY_CARE_PROVIDER_SITE_OTHER): Payer: Medicare Other | Admitting: Surgery

## 2014-05-12 VITALS — BP 136/76 | HR 81 | Temp 97.0°F | Ht 71.0 in | Wt 285.0 lb

## 2014-05-12 DIAGNOSIS — K430 Incisional hernia with obstruction, without gangrene: Secondary | ICD-10-CM

## 2014-05-12 DIAGNOSIS — Z992 Dependence on renal dialysis: Secondary | ICD-10-CM

## 2014-05-12 NOTE — Progress Notes (Signed)
Subjective:     Patient ID: Jonathon Bailey, male   DOB: 26-Jul-1956, 58 y.o.   MRN: [id number]  HPI   Note: Portions of this report may have been transcribed using voice recognition software. Every effort was made to ensure accuracy; however, inadvertent computerized transcription errors may be present.   Any transcriptional errors that result from this process are unintentional.       [name]  [birth year] [id number]  Patient Care Team: [name], DO as PCP - General (Pediatrics) [name], MD as Consulting Physician (Nephrology) [name], MD as Consulting Physician (Nephrology)  Procedure (Date: 03/15/2014):  POST-OPERATIVE DIAGNOSIS: end stage renal disease, dialysis dependant, incisional hernia  PROCEDURE: Procedure(s):  LAPAROSCOPIC LYSIS OF ADHESIONS X 15 MIN  LAPARASCOPIC INCISIONAL HERNIA REPAIRS x5 WITH MESH  LAPAROSCOPIC OMENTOPEXY  LAPAROSCOPIC RESCTION OF EPIPLOIC APPENDAGES (SIGMOID COLON)  LAPAROSCOPIC INSERTION CONTINUOUS AMBULATORY PERITONEAL DIALYSIS CATHETER  SURGEON: Surgeon(s):  [name], MD  ASSISTANT: RN    This patient returns for surgical re-evaluation.  He struggled with postoperative pain and took a few days to recover in hospital.  Followed by nephrology.  His wife fell and broke her ankle - She requires plating and surgery.  Really done with rehabilitation at [facility].  He still wanted to go home with a patchwork of family support.  He still has moderate soreness but is improving.  Narcotics help.  Constipation controlled with Dulcolax.  Getting flushes of his peritoneal dialysis catheter.  No fevers or chills.  Energy level getting better.  In good spirits overall.  Had many questions.  He has been having pain with flushes at 500 mils.  A home therapy and nephrology team were concerned and requested reevaluation.  The worst pain was 2/10.  Not taking narcotics.  He feels a lump at his mid abdomen and worried  about hernia recurrence.    Patient Active Problem List   Diagnosis Date Noted  . CAPD (continuous ambulatory peritoneal dialysis) status 03/15/2014  . Morbid obesity 02/01/2014  . 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 Preventative health care 12/02/2013  . Other complications due to renal dialysis device, implant, and graft 10/24/2012  . COPD (chronic obstructive pulmonary disease) 10/23/2012  . DIABETIC PERIPHERAL NEUROPATHY 01/09/2010  . ESRD (end stage renal disease) on dialysis 08/30/2009  . CEREBROVASCULAR ACCIDENT, HX OF 08/30/2009  . Diabetes mellitus type 2 with complications [id number]  . HYPERLIPIDEMIA 09/21/2008  . OBSTRUCTIVE SLEEP APNEA 09/21/2008  . ESSENTIAL HYPERTENSION 09/21/2008  . CORONARY ARTERY DISEASE 09/21/2008  . Chronic combined systolic and diastolic heart failure Q000111Q    Past Medical History  Diagnosis Date  . Hyperlipidemia   . Hypertension   . Arthritis   . Anxiety   . Depression   . Chronic systolic heart failure Q000111Q    ECHO Feb 2013 showed LVEF low normal at 50-55%, +hypokinetic anterolateral wall and inferolateral wall.    . Obesity   . Macular edema   . Diabetic retinopathy   . Type II diabetes mellitus   . Myocardial infarction     status post MI x2 and 3 stents placed in 2003  . Asthma     "as a child"  . Sleep apnea     "sleep w/CPAP sometimes" (03/15/2014)  . [id number])     "maybe monthly" (03/15/2014)  . Stroke ~ 2007; ~1987    "weak on right side; messed w/right side of brain;  cry all the time"  . CKD (chronic kidney disease) stage 4, GFR 15-29 ml/min 08/30/2009    Progressive renal failure since 2008, creatinine 1.2 in 2008 up to 3.5 in 2012 and 3.2-5.0 in 2013. All UA's 2011-13 showed >300 protein on dipstick. Work-up in May 2011 showed negative Urine IFE and SPEP, ultrasound showed 12-13 cm kidneys with increased echogenicity and UPC ratio was 1.5 gm  proteinuria.  Hgb A1C's from 2011 to 2013 were all between 9-11.  Patient saw Dr. Donnetta Hutching (vasc surgery) for HD access in Aug 2013 > vein mapping was done and Dr. Donnetta Hutching felt the left arm (pt is R handed) was suitable for L arm Cimino radiocephalic fistula. Patient said he wasn't ready to consider doing dialysis and declined the surgery.     Marland Kitchen ESRD (end stage renal disease) on dialysis     Past Surgical History  Procedure Laterality Date  . Umbilical hernia repair    . Av fistula placement  10/17/2012    Procedure: ARTERIOVENOUS (AV) FISTULA CREATION;  Surgeon: Mal Misty, MD;  Location: [place];  Service: Vascular;  Laterality: Left;  . Peritoneal catheter insertion  03/15/2014  . Hernia repair    . Incisional hernia repair  03/15/2014  . Laparoscopic lysis of adhesions  03/15/2014  . Coronary angioplasty with stent placement  ~ 2002    "3"  . Refractive surgery Bilateral   . Capd insertion N/A 03/15/2014    Procedure: LAPAROSCOPIC INSERTION CONTINUOUS AMBULATORY PERITONEAL DIALYSIS CATHETER, LAPARASCOPIC INCISIONAL HERNIA  REPAIR  WITH MESH, OMENTOPEXY AND LYSIS OF ADHESIONS;  Surgeon: [name], MD;  Location: [place];  Service: General;  Laterality: N/A;    History   Social History  . Marital Status: Divorced    Spouse Name: N/A    Number of Children: N/A  . Years of Education: N/A   Occupational History  . Not on file.   Social History Main Topics  . Smoking status: Former Smoker -- 1.00 packs/day for 15 years    Types: Cigarettes    Quit date: 09/24/1986  . Smokeless tobacco: Never Used  . Alcohol Use: Yes     Comment: stopped in 2013  . Drug Use: No  . Sexual Activity: Not Currently   Other Topics Concern  . Not on file   Social History Narrative  . No narrative on file    Family History  Problem Relation Age of Onset  . Asthma Mother   . Hyperlipidemia Mother   . Hypertension Mother   . Stroke Father   . Heart attack Father   . Prostate cancer Father   .  Deep vein thrombosis Father   . Cancer Father   . Diabetes Father   . Hyperlipidemia Father   . Hypertension Father   . Other Father     varicose veins  . Heart disease Father     before age 14  . Other Sister     varicose veins    Current Outpatient Prescriptions  Medication Sig Dispense Refill  . albuterol (PROVENTIL HFA;VENTOLIN HFA) 108 (90 BASE) MCG/ACT inhaler Inhale 2 puffs into the lungs every 4 (four) hours as needed for wheezing. For wheezing      . Artificial Tear Solution (SOOTHE HYDRATION OP) Place 1 drop into both eyes 3 (three) times daily.      Marland Kitchen atorvastatin (LIPITOR) 80 MG tablet Take 40 mg by mouth daily.      . calcitRIOL (ROCALTROL) 0.25 MCG capsule Take  0.25-0.5 mcg by mouth daily. Alternates takes 1 tablet and 2 capsules the next day      . carvedilol (COREG) 12.5 MG tablet Take 37.5 mg by mouth 2 (two) times daily with a meal.       . clopidogrel (PLAVIX) 75 MG tablet Take 75 mg by mouth daily.      . hydrALAZINE (APRESOLINE) 50 MG tablet Take 50 mg by mouth 3 (three) times daily.      Marland Kitchen HYDROcodone-acetaminophen (NORCO) 7.5-325 MG per tablet Take 1-2 tablets by mouth every 6 (six) hours as needed for moderate pain or severe pain.  50 tablet  0  . insulin detemir (LEVEMIR) 100 UNIT/ML injection Inject 75 Units into the skin 2 (two) times daily.      . Insulin Pen Needle (B-D ULTRAFINE III SHORT PEN) 31G X 8 MM MISC Use with insulin administration.  100 each  0  . sevelamer carbonate (RENVELA) 800 MG tablet Take 800 mg by mouth 3 (three) times daily with meals.       . tiotropium (SPIRIVA) 18 MCG inhalation capsule Place 18 mcg into inhaler and inhale daily as needed.        No current facility-administered medications for this visit.     Allergies  Allergen Reactions  . Feraheme [Ferumoxytol] Itching    Severe itching.    BP 136/76  Pulse 81  Temp(Src) 97 F (36.1 C)  Ht 5\' 11"  (1.803 m)  Wt 285 lb (129.275 kg)  BMI 39.77 kg/m2  Dg Chest 2  View  03/15/2014   CLINICAL DATA:  58 year old male preoperative study for hernia repair. Shortness of breath, hypertension, diabetes, dialysis. Initial encounter.  EXAM: CHEST  2 VIEW  COMPARISON:  02/20/2013.  FINDINGS: Right chest tunneled dual lumen dialysis type catheter now in place. Improved lung volumes from the comparison. No pneumothorax, pulmonary edema, pleural effusion or confluent pulmonary opacity. Stable cardiac size and mediastinal contours. Visualized tracheal air column is within normal limits. No acute osseous abnormality identified.  IMPRESSION: No acute cardiopulmonary abnormality.   Electronically Signed   By: Lars Pinks M.D.   On: 03/15/2014 08:06     Review of Systems  Constitutional: Negative for fever, chills and diaphoresis.  HENT: Negative for sore throat and trouble swallowing.   Eyes: Negative for photophobia and visual disturbance.  Respiratory: Negative for choking and shortness of breath.   Cardiovascular: Negative for chest pain and palpitations.  Gastrointestinal: Negative for nausea, vomiting, abdominal distention, anal bleeding and rectal pain.  Genitourinary: Negative for dysuria, urgency, difficulty urinating and testicular pain.  Musculoskeletal: Negative for arthralgias, gait problem, myalgias and neck pain.  Skin: Negative for color change and rash.  Neurological: Negative for dizziness, speech difficulty, weakness and numbness.  Hematological: Negative for adenopathy.  Psychiatric/Behavioral: Negative for hallucinations, confusion and agitation.       Objective:   Physical Exam  Constitutional: He is oriented to person, place, and time. He appears well-developed and well-nourished. No distress.  HENT:  Head: Normocephalic.  Mouth/Throat: Oropharynx is clear and moist. No oropharyngeal exudate.  Eyes: Conjunctivae and EOM are normal. Pupils are equal, round, and reactive to light. No scleral icterus.  Neck: Normal range of motion. No tracheal  deviation present.  Cardiovascular: Normal rate, normal heart sounds and intact distal pulses.   Pulmonary/Chest: Effort normal. No respiratory distress.  Abdominal: Soft. He exhibits no distension. There is no tenderness. Hernia confirmed negative in the right inguinal area and confirmed negative in the  left inguinal area.  Regional dressing still on.  I removed them.  Incisions clean with normal healing ridges.  No hernias  Musculoskeletal: Normal range of motion. He exhibits no tenderness.  Neurological: He is alert and oriented to person, place, and time. No cranial nerve deficit. He exhibits normal muscle tone. Coordination normal.  Skin: Skin is warm and dry. No rash noted. He is not diaphoretic.  Psychiatric: He has a normal mood and affect. His behavior is normal.       Assessment:     Recovering status post laparoscopic repair of Swiss cheese type incarcerated ventral hernias with omentum.  Also placement of peritoneal dialysis catheter.     Plan:     I called and discussed with the home therapy team.  They were concerned about pain with flushings.  I think it safe to retry.  He is going to have discomfort given his moderate mesh and morbid obesity and other chronic pain issues.  This should gradually improve with time.  Continue peritoneal dialysis catheter flushings.  Increase activity as tolerated to regular activity.  Low impact exercise such as walking an hour a day at least ideal.  Do not push through pain.    Diet as tolerated.  Low fat high fiber diet ideal.  Bowel regimen with 30 g fiber a day and fiber supplement as needed to avoid problems.  Return to clinic as needed.   Instructions discussed.  Followup with primary care physician for other health issues as would normally be done.  Consider screening for malignancies (breast, prostate, colon, melanoma, etc) as appropriate.  Questions answered.  The patient expressed understanding and appreciation

## 2014-05-12 NOTE — Patient Instructions (Signed)
Peritoneal Dialysis (CAPD) Catheter  Healthy kidneys remove excess water and waste products from the body in the form of urine. When the kidneys cannot do this, serious problems develop and a person can fall into kidney failure.   In most cases, the kidneys can heal and recover to a better place.  Sometimes the kidneys remain somewhat damaged and a kidney specialist (nephrologist) needs to help manage the disease with medications and adjustments in diet.    Sometimes the kidney failure has progressed too far.  The body's waste and fluid build up in the blood. Hands and legs swell. You start to feel more weak or nauseated. Your blood pressure may rise, to seeing you at serious risk for heart attack and stroke.  If not treated, you will eventually die. Dialysis is a treatment that replaces the work that your kidneys would do if they were healthy to help keep you alive.  Dialysis can be done using a machine outside of the body (hemodialysis) connected to your blood; or, it can be done with a tube placed to enter your abdomen (peritoneal dialysis). The peritoneum is the inner lining of your abdominal cavity, covering the inner organs & abdominal wall.  This inner lining of peritoneum works like a filter.   Peritoneal dialysis involves placed several quarts of sterile fluid into the inner abdomen/peritoneal cavity.  The waste products and fluids can exchange across the peritoneal membrane.  The fluid is later removed.  This most poor every day.  Sometimes the catheter can be attached to a machine that runs at night while you sleep (continuous cycler-assisted dialysis = CCPD).  Sometimes the fluid can be infused in her abdomen and then later removed hours later (continuous ambulatory peritoneal dialysis = CAPD).  If your nephrologist feels like you are a good candidate, you will be sent to be evaluated by a surgeon to consider placement of a peritoneal dialysis catheter.  Your nephrologist should have shown you  educational videos .  You should discuss with dialysis nurses about what life with a peritoneal dialysis catheter is like.  Most patients who have not had many abdominal operations are reasonable candidates for placement of a peritoneal dialysis catheter   It is important to understand the risks and benefits of the procedure prior to undergoing surgery.    If your surgeon feels you are a reasonable candidate, you will have a peritoneal dialysis catheter placed.    This requires an operation under general anesthesia.  The surgeon places a soft plastic tube ([place] curl catheter) into your abdomen.  The deeper curled tip rests down in the pelvis.  The middle part is tunneled inside your abdominal wall where cuffs provide a water-tight seal.  This leaves an outer foot of plastic tubing resting outside your body, usually in your lower abdomen below the beltline near your waist.  This is a quick procedure that can often be done as an outpatient with the possibility of staying overnight.  It takes several weeks for the catheter to heal into a watertight seal.  Initially, your nephrologist we will have the dialysis nurses do gentle flushes through the catheter.  Once the catheter is well sealed in, you will begin larger exchanges of fluid to do full peritoneal dialysis.  Your nephrologist and dialysis nurses will help guide you through this.  While placement of the catheter is usually not technically difficult, there are risks to the procedure.  The biggest risk is infection.  This is why we place  the catheter in the operating room with the use of intravenous antibiotics.  Using sterile technique to hook up the catheter to your dialysis fluids is essential.  Most infections of the catheter are mild and can be treated with antibiotics placed in the vein were placed with the peritoneal fluid.  However, sometimes the infection worsens or persists and the catheter needs to be removed.  Occasionally, the catheters can  be blocked due to inner organs plugging up the tubing or kinks.  Sometimes the catheter leaks and needs to be replaced.  Sometimes the inner abdomen has too many adhesions from prior surgeries or infections and there is not enough space for fluid exchanges to occur.  Sometimes, peritoneal dialysis is not enough to compensate for the kidney failure.   In rare instances, the inner lining of the abdomen becomes severely thickened or inflamed and peritoneal dialysis can no longer work.  Sometimes it is not possible to safely replace a new catheter and the patient must go on hemodialysis permanently.  It is very common to need hemodialysis intermittently even if you have a peritoneal dialysis catheter in cases of emergencies or if peritoneal dialysis fails.  Your nephrologist and surgeon can help you decide what the best form of dialysis is for you  PERITONEAL DIALYSIS (CAPD) CATHETER PLACEMENT:  POST OPERATIVE INSTRUCTIONS  FOLLOW UP with the Peritoneal Dialysis Nurses  [address], [place], [place] [address]  Call [phone] or your neprologist to help arrange training/flushes of your CAPD catheter   The CAPD nurses & Nephrology usually follow you closely, making the need for follow-up in the [facility] surgery office redundant and therefore not always needed.  If they or you have concerns, please call us for possible follow-up in our office  1. Do NOT shower until dialysis nursing staff have advised.  Do NOT submerge in a bathtub or hot tub. 2. Your Home Therapy RN will advise and educate you on showering, bathing, and swimming when you are in training. 3. The Peritoneal Dialysis nurse will remove your waterproof bandages in the Dialysis Center a few days after surgery.  Do not remove the bandages until seen by them.  If you dressing becomes wet, saturated, or falls off call your Home Therapy RN. 4. ACTIVITIES as tolerated:   a. You may resume regular (light) daily activities beginning the next day-such  as daily self-care, walking, climbing stairs-gradually increasing activities as tolerated.  If you can walk 30 minutes without difficulty, it is safe to try more intense activity such as jogging, treadmill, bicycling, low-impact aerobics,  etc. b. No swimming within the 1st month of catheter placement.  You must have a Dr's order. c. Save the most intensive and strenuous activity for last such as sit-ups, heavy lifting, contact sports, etc  Refrain from any heavy lifting or straining until you are off narcotics for pain control.   d. DO NOT PUSH THROUGH PAIN.  Let pain be your guide: If it hurts to do something, don't do it.  Pain is your body warning you to avoid that activity for another week until the pain goes down. e. You may drive when you are no longer taking prescription pain medication, you can comfortably wear a seatbelt, and you can safely maneuver your car and apply brakes. f. You may have sexual intercourse when it is comfortable.  g. Be sure your catheter is taped to Your abdomen nor injured. Did not allow catheter to ankle, or tension on the catheter-it may cause  damage to skin over the catheter h. You will be instructed on what to do an emergency, and will have access to on call our in 24 hours a day. The number to reach the home therapy nurse as listed below. 5. DIET: Follow a light bland diet the first 24 hours after arrival home, such as soup, liquids, crackers, etc.  Be sure to include lots of fluids daily.  Avoid fast food or heavy meals as your are more likely to get nauseated.   6. Take your usually prescribed home medications unless otherwise directed. 7. PAIN CONTROL: a. Pain is best controlled by a usual combination of three different methods TOGETHER: i. Ice/Heat ii. Tylenol (over the counter pain medication) iii. Prescription pain medication b. Most patients will experience some swelling and bruising around the incisions.  Ice packs or heating pads (30-60 minutes up to 6  times a day) will help. Use ice for the first few days to help decrease swelling and bruising, then switch to heat to help relax tight/sore spots and speed recovery.  Some people prefer to use ice alone, heat alone, alternating between ice & heat.  Experiment to what works for you.  Swelling and bruising can take several weeks to resolve.   c. It is helpful to take an over-the-counter pain medication regularly for the first few weeks.  Using acetaminophen (Tylenol, etc) 500-650mg  four times a day (every meal & bedtime) is usually safest since NSAIDs are not advisable in patients with kidney disease. d. A  prescription for pain medication (such as oxycodone, hydrocodone, etc) should be given to you upon discharge.  Take your pain medication as prescribed.  i. If you are having problems/concerns with the prescription medicine (does not control pain, nausea, vomiting, rash, itching, etc), please call us [phone] to see if we need to switch you to a different pain medicine that will work better for you and/or control your side effect better. ii. If you need a refill on your pain medication, please contact your pharmacy.  They will contact our office to request authorization. Prescriptions will not be filled after 5 pm or on week-ends. 8. Avoid getting constipated.  Between the surgery and the pain medications, it is common to experience some constipation.  Increasing fluid intake and taking a fiber supplement (such as Metamucil, Citrucel, FiberCon, MiraLax, etc) 1-2 times a day regularly will usually help prevent this problem from occurring.  A mild laxative (prune juice, Milk of Magnesia, MiraLax, etc) should be taken according to package directions if there are no bowel movements after 48 hours.       FOLLOW UP: Peritoneal Dialysis nurses  [address] [place], [place] [address]  Call [phone] or your neprologist to help arrange training/flushes of your CAPD catheter    -The CAPD nurses &  Nephrology usually follow you closely, making the need for follow-up in our office redundant and therefore not needed.  If they or you have concerns, please call us for possible follow-up in our office   -Please call [facility] at [phone] only as needed.  WHEN TO CALL us [phone]: 1. Poor pain control 2. Reactions / problems with new medications (rash/itching, nausea, etc)  3. Fever over 101.5 F (38.5 C) 4. Worsening swelling or bruising 5. Continued bleeding from incision. 6. Increased pain, redness, or drainage from the incision   The clinic staff is available to answer your questions during regular business hours (8:30am-5pm).  Please don't hesitate to call and  ask to speak to one of our nurses for clinical concerns.   If you have a medical emergency, go to the nearest emergency room or call 911.  A surgeon from [facility] is always on call at the hospitals  9. IF YOU HAVE DISABILITY OR FAMILY LEAVE FORMS, BRING THEM TO THE OFFICE FOR PROCESSING.  DO NOT GIVE THEM TO YOUR DOCTOR.  [facility], [address], [place], [place], [place]  [address] ? MAIN: [phone] ? TOLL FREE: [phone] ?  FAX [phone] [email or web address]  Peritoneal Dialysis - An Overview Dialysis can be done using a machine outside of the body (hemodialysis). Or, it can be done inside the body (peritoneal dialysis). The word "peritoneal" refers to the lining or membrane of the belly (abdominal cavity). The peritoneal membrane is a thin, plastic-like lining inside the belly that covers the organs and fits in the abdominal or peritoneal cavity, such as the stomach, liver and the kidneys. This lining works like a filter. It will allow certain things to pass from your blood through the lining and into a special solution that has been placed into your belly. In this type of dialysis, the peritoneum is used to help clean the blood.  If you need dialysis,  your kidneys are not working right. Healthy kidneys take out extra water and waste products, which becomes urine. When the kidneys do not do this, serious problems can develop. The waste and water build up in the blood. Your hands and feet might swell. You may feel tired, weak or sick to your stomach. Also, your blood pressure may rise. If not treated, you could die. Dialysis is a treatment that does the work that your kidneys would do if they were healthy.  It cleans your blood.   It will make sure your body has the right amount of certain chemicals that it needs. They include potassium, sodium and bicarbonate.   It will help control your blood pressure.  UNDERSTANDING PERITONEAL DIALYSIS  Here is how peritoneal dialysis works:   First, you will have surgery to put a soft plastic tube (catheter) into your belly (abdomen). This will allow you to easily connect yourself to special tubing, which will then let a special dialysis solution to be placed into your abdomen.   For each treatment, you will need at least one bag of dialysis solution (a liquid called dialysate). It is a mix of water that is pure and free of germs (sterile), sugar (dextrose) and the nutrients and minerals found in your blood. Sometimes, more than one bag is needed to get the right amount of fluid for your abdomen. Your caregiver will explain what size and how many bags you will need.   The dialysate is slowly put through the catheter to fill the abdomen (called the peritoneal cavity). This dialysate will need to stay in your body for 3-4 hours. This is known as the dwell time.   The solution is working to clean the blood and remove wastes from your body. At the end of this time, the solution is drained from your body through tubing into an empty bag. It is then replaced with a fresh dialysate.   The draining and replacing of the dialysate is called an exchange or cycle. The catheter is capped after each exchange. Once the  solution is in your body, you are then free to do whatever you would like until the next exchange. Most people will need to do 4-5  exchanges each day.   There are two different methods that can be used.   Continuous ambulatory peritoneal dialysis (CAPD): You put the solution into your abdomen, cap your catheter and then go about your day. Several hours later, you reconnect to a tubing set up, drain out the solution and then put more solution in. This is done several times a day. No machine is needed.   Continuous cycler-assisted peritoneal dialysis (CCPD): A machine is used, which fills the abdomen with dialysate and then drains it. This happens several times. It usually is done at night while you are sleeping. When you wake up, you can disconnect from the machine and are free to go to go about your day.  PREPARING FOR EXCHANGES  Discuss the details of the procedure with your caregivers. You will be working with a nurse who is specially trained in doing dialysis. Make sure you understand:   How to do an exchange.   How much solution you need.   What type of solution you will need.   How often you should do an exchange. Ask:   How many times each day?   When? At meals? At bedtime?   Always keep the dialysate bags and other supplies in a cool, clean and dry place.   Keeping everything clean is very important.   The catheter and its cap must be free from germs (sterile)   The adapter also must be sterile. It attaches the dialysis bag and tubing to the catheter.   Clean the area of your body around the catheter every day. Use a chemical that fights infection (antiseptic).   Wash your hands thoroughly before starting an exchange.   You may be taught to wear a mask to cover your nose and mouth. This makes infection less likely to happen.   You may be taught to close doors, windows and turn off any fans before doing an exchange.   Check the dialysate bag very carefully.   Make sure  it is the right size bag for you. This information is on the label.   Also, make sure it is the right mixture. For some people, the dialysate contents vary. For instance, the mixture might be a stronger solution for overnight.   Check the expiration date (the last date you can use the bag). It also is on the label. If the date has gone by, throw away the bag.   The solution should be clear. You should be able to see any writing on the side of the bag clearly through the solution. Do not use a cloudy solution.   Gently squeeze the bag to make sure there are no leaks.   Use a dry heating pad to warm the dialysate in the bag. Leave the cover on the bag while you do this.   This is for comfort. You can skip this step if you want.   Never place the bag of solution under warm or hot water. Water from a faucet is not sterile and could cause germs to get into the bag. Infection could then result.  PERFORMING AN EXCHANGE  For continuous ambulatory dialysis:   Attach the dialysis bag and tubing to your catheter. Hang the bag so that gravity (the natural downward pull) draws the solution down and into your abdomen once the clamps are opened. This should take about 10 minutes.   Remove the bag and tubing from the catheter. Cap the catheter.   The solution stays in the abdomen  for 3-4 hours (dwell time). The solution is working to clean the blood and remove wastes from your body.   When you are ready to drain the solution for another exchange, take the cap off the catheter. Then, attach the catheter to tubing, which is attached to an empty bag. Place this empty bag below the abdomen or on the floor or stool and undo the clamps.   Gravity helps pull the fluid out of the abdomen and into the bag. The fluid in the bag may look yellow and clear, like urine. It usually takes about 20 minutes to drain the fluid out of the abdomen.   When the solution has drained, start the process again by infusing a new  bag of dialysate and then capping the catheter.   This should continue until you have used all of the solution that you are to use each day.   Sometimes, a small machine is used overnight. It is called a mini-cycler. This is done if the body cannot go all night without an exchange. The machine lets you sleep without having to get up and do an exchange.   For continuous cycler-assisted dialysis:   You will be taught how to set up or program your machine.   When you are ready for bed, put the dialysate bags onto the cycler machine. Put on exactly the number of bags that your caregiver said to use.   Connect your catheter to the machine and turn the cycler machine on.   Overnight, the cycler will do several exchanges. It often does three to five, sometimes more.   Solution that is in your abdomen in the morning will stay during the day. The machine is set to make the daytime solution stronger, if that is needed.   In the morning, you will disconnect from the machine and cap your catheter and go about your day.   Sometimes, an extra exchange is done during the day. This may be needed to remove excess waste or fluid.  IMPORTANT REMINDERS  You will need to follow a very strict schedule. Every step of the dialysis procedure must be done every day. Sometimes, several times a day. Altogether, this might take an extra 2 hours or more. However, you must stick to the routine. Do not skip a day. Do not skip a procedure.   Some people find it helpful to work with a Social worker or Education officer, museum in addition to the renal (kidney) nurse. They can help you figure out how to change your daily routine to fit in the dialysis sessions.   You may need to change your diet. Ask your caregiver for advice, or talk with a nutritionist about what you should and should not eat.   You will need to weigh yourself every day and keep track of what your weight is.   You may be taught how to check your blood pressure before  every exchange. Your blood pressure reading will help determine what type of solution to use. If your blood pressure is too high, you may need a stronger solution.  RISKS AND COMPLICATIONS  Possible problems vary, depending on the method you use. Your overall health also can have an effect. Problems that could develop because of dialysis include:  Infection. This is the most common problem. It could occur:   In the peritoneum. This is called peritonitis.   Around the catheter.   Weight gain. The dialysate contains a type of sugar known as dextrose. Dextrose has a  lot of calories. The body takes in several hundred calories from this sugar each day.   Weakened muscles in the abdomen. This can result from all of the fluid that your body has to hold in the abdomen.   Catheter replacement. Sometimes, a new one has to be put in.   Change in dialysis method. Due to some complications, you may need to change to hemodialysis for a short time and have your dialysis done at a center.   Trouble adjusting to your new lifestyle. In some people, this leads to depression.   Sleep problems.   Dialysis-related amyloidosis. This sometimes occurs after 5 years of dialysis. Protein builds up in the blood. This can cause painful deposits on bones, joints and tendons (which connect muscle to bone). Or, it can cause hollow spots in bones that make them more likely to break.   Excess fluid. Your body may absorb too much of the fluid that is held in the abdomen. This can lead to heart or lung problems.  SEEK MEDICAL CARE IF:   You have any problems with an exchange.   The area around the catheter becomes red or painful.   The catheter seems loose, or it feels like it is coming out.   A bag of dialysate looks cloudy. Or, the liquid is an unusual color.   Abdominal pain or discomfort.   You feel sick to your stomach (nauseous) or throw up (vomit).   You develop a fever of more than 102 F (38.9 C).   SEEK IMMEDIATE MEDICAL CARE IF:  You develop a fever of more than 102 F (38.9 C). Document Released: 07/08/2009 Document Revised: 08/30/2011 Document Reviewed: 07/08/2009 [facility] Patient Information 2012 [employer].  Diet for Peritoneal Dialysis This diet may be modified in protein, sodium, phosphorus, potassium, or fluid, depending on your needs. The goals of nutrition therapy are similar to those for patients on hemodialysis. Providing enough protein to replace peritoneal losses is a priority. USES OF THIS DIET The diet is designed for the patient with end-stage kidney (renal) disease, who is treated by peritoneal dialysis. Treatment options include:  Continuous Ambulatory Peritoneal Dialysis (CAPD): Usually 4 exchanges of 1.5 to 2 liter volumes of glucose (sugar) and electrolyte-containing dialysate.   Continuous Cyclic Peritoneal Dialysis (CCPD): Essentially a reversal of CAPD, with shorter exchanges at night and a longer one during the day.   Intermittent Peritoneal Dialysis (IPD): 10 to 12 hours of exchanges, 2 to 3 times weekly.  ADEQUACY The diet may not meet the Recommended Dietary Allowances of the [employer] for calcium and ascorbic acid. Protein and water-soluble vitamin needs may be increased because of losses into the dialysate. Recommended daily supplements are the same as for hemodialysis patients. ASSESSMENT/DETERMINATION OF DIET Dietary needs will differ between patients. Parameters must be individualized. Protein  Guidelines: 1.2 to 1.3 gm/kg/day OR 1.5 gm/kg/day if patient is malnourished, catabolic, or has a protracted episode of peritonitis. A minimum of 50% of the protein intake should be of high biological value.   Goals: Meet protein requirements and replace dialysate losses while avoiding excessive accumulation of waste products. Achieve serum albumin greater than 3.5 g/dL.   Evaluate: Current nutritional status, serum albumin and BUN  levels, presence of peritonitis.  Sodium  Guidelines: Usually 90 to 175 mEq (2000 to 4000 mg), but should be individualized.   Goals: Minimize complications of fluid imbalance.   Evaluate: Weight, blood pressure regulation, and presence of swelling (edema).  Potassium  Guidelines:  Individualized; often not restricted, and may need to be supplemented.   Goals: Serum K+ levels between 4.0 to 5.0 mEq/L.   Evaluate: Serum K+ levels, usual intake of K+, appetite.  Phosphorus  Guidelines: 800 to 1200 mg/day (the high protein intake results in a high obligatory P intake).   Goal: Serum P levels between 4.5 to 6.0 mg/dL.   Evaluate: Serum P levels, usual P intake, P-binding medications: type, number, dosage, distribution.  Fluids  Guidelines: Individualized - may not be restricted for all patients.   Goal: Minimize complications of fluid imbalance.   Evaluate: Weight, blood pressure regulation, sodium intake, and presence of edema.  Document Released: 09/10/2005 Document Revised: 08/30/2011 Document Reviewed: 12/03/2006 [facility] Patient Information 2012 [place].  Managing Pain  Pain after surgery or related to activity is often due to strain/injury to muscle, tendon, nerves and/or incisions.  This pain is usually short-term and will improve in a few months.   Many people find it helpful to do the following things TOGETHER to help speed the process of healing and to get back to regular activity more quickly:  1. Avoid heavy physical activity a.  no lifting greater than 20 pounds b. Do not "push through" the pain.  Listen to your body and avoid positions and maneuvers than reproduce the pain c. Walking is okay as tolerated, but go slowly and stop when getting sore.  d. Remember: If it hurts to do it, then don't do it! 2. Take Anti-inflammatory medication  a. Take with food/snack around the clock for 1-2 weeks i. This helps the muscle and nerve tissues become less irritable  and calm down faster ii. Choose acetaminophen 500mg  tabs (Tylenol) 1-2 pills with every meal and just before bedtime 3. Use a Heating pad or Ice/Cold Pack a. 4-6 times a day b. May use warm bath/hottub  or showers 4. Try Gentle Massage and/or Stretching  a. at the area of pain many times a day b. stop if you feel pain - do not overdo it  Try these steps together to help you body heal faster and avoid making things get worse.  Doing just one of these things may not be enough.    If you are not getting better after two weeks or are noticing you are getting worse, contact our office for further advice; we may need to re-evaluate you & see what other things we can do to help.

## 2014-06-09 IMAGING — CR DG CHEST 2V
2 series · 2 of 2 positions shown · non-contrast
Comparison: 10/12/2012 and multiple radiographs dated back to
01/23/2004 and a CT scan of the chest dated 01/23/2004

CLINICAL DATA: Dyspnea.

CHEST - 2 VIEW

[w chest pa]
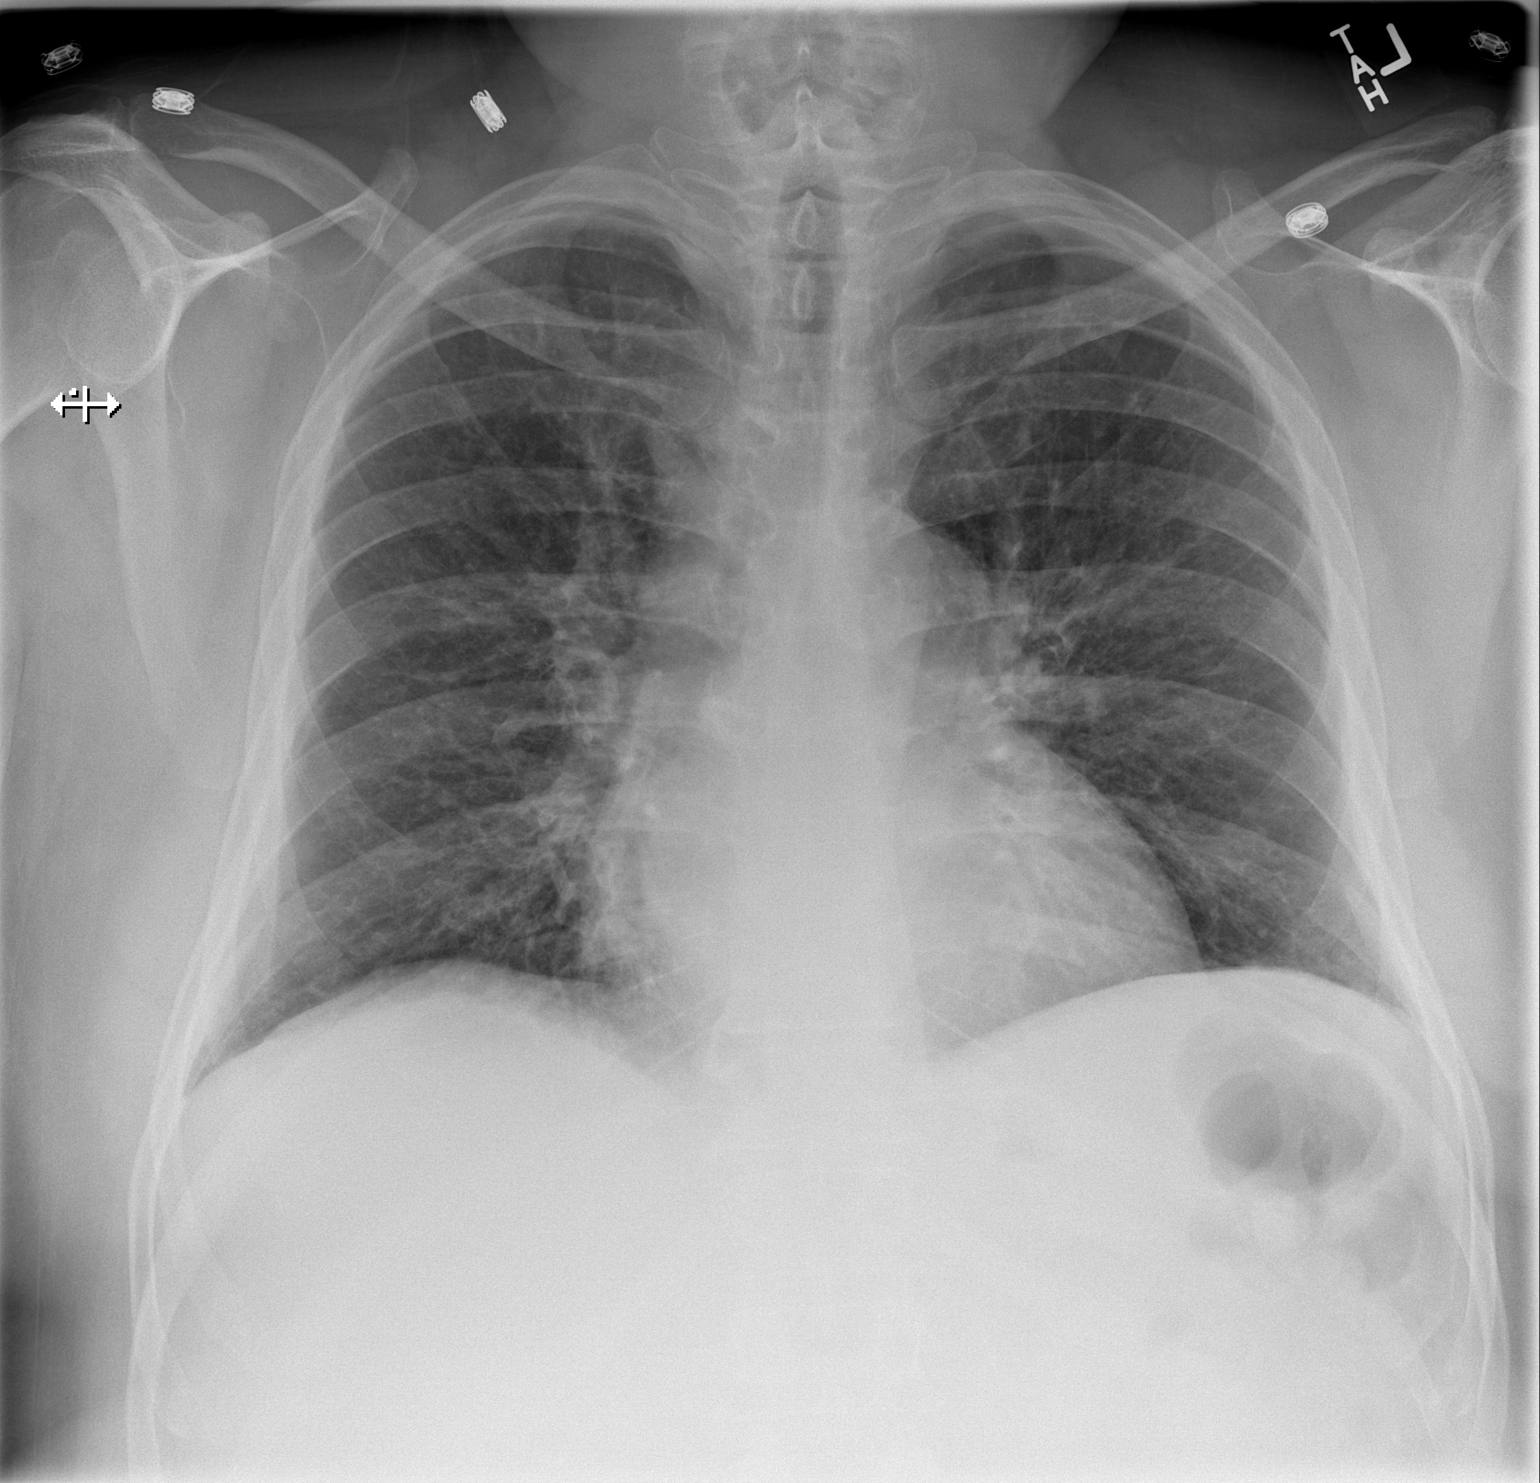

[w chest lat]
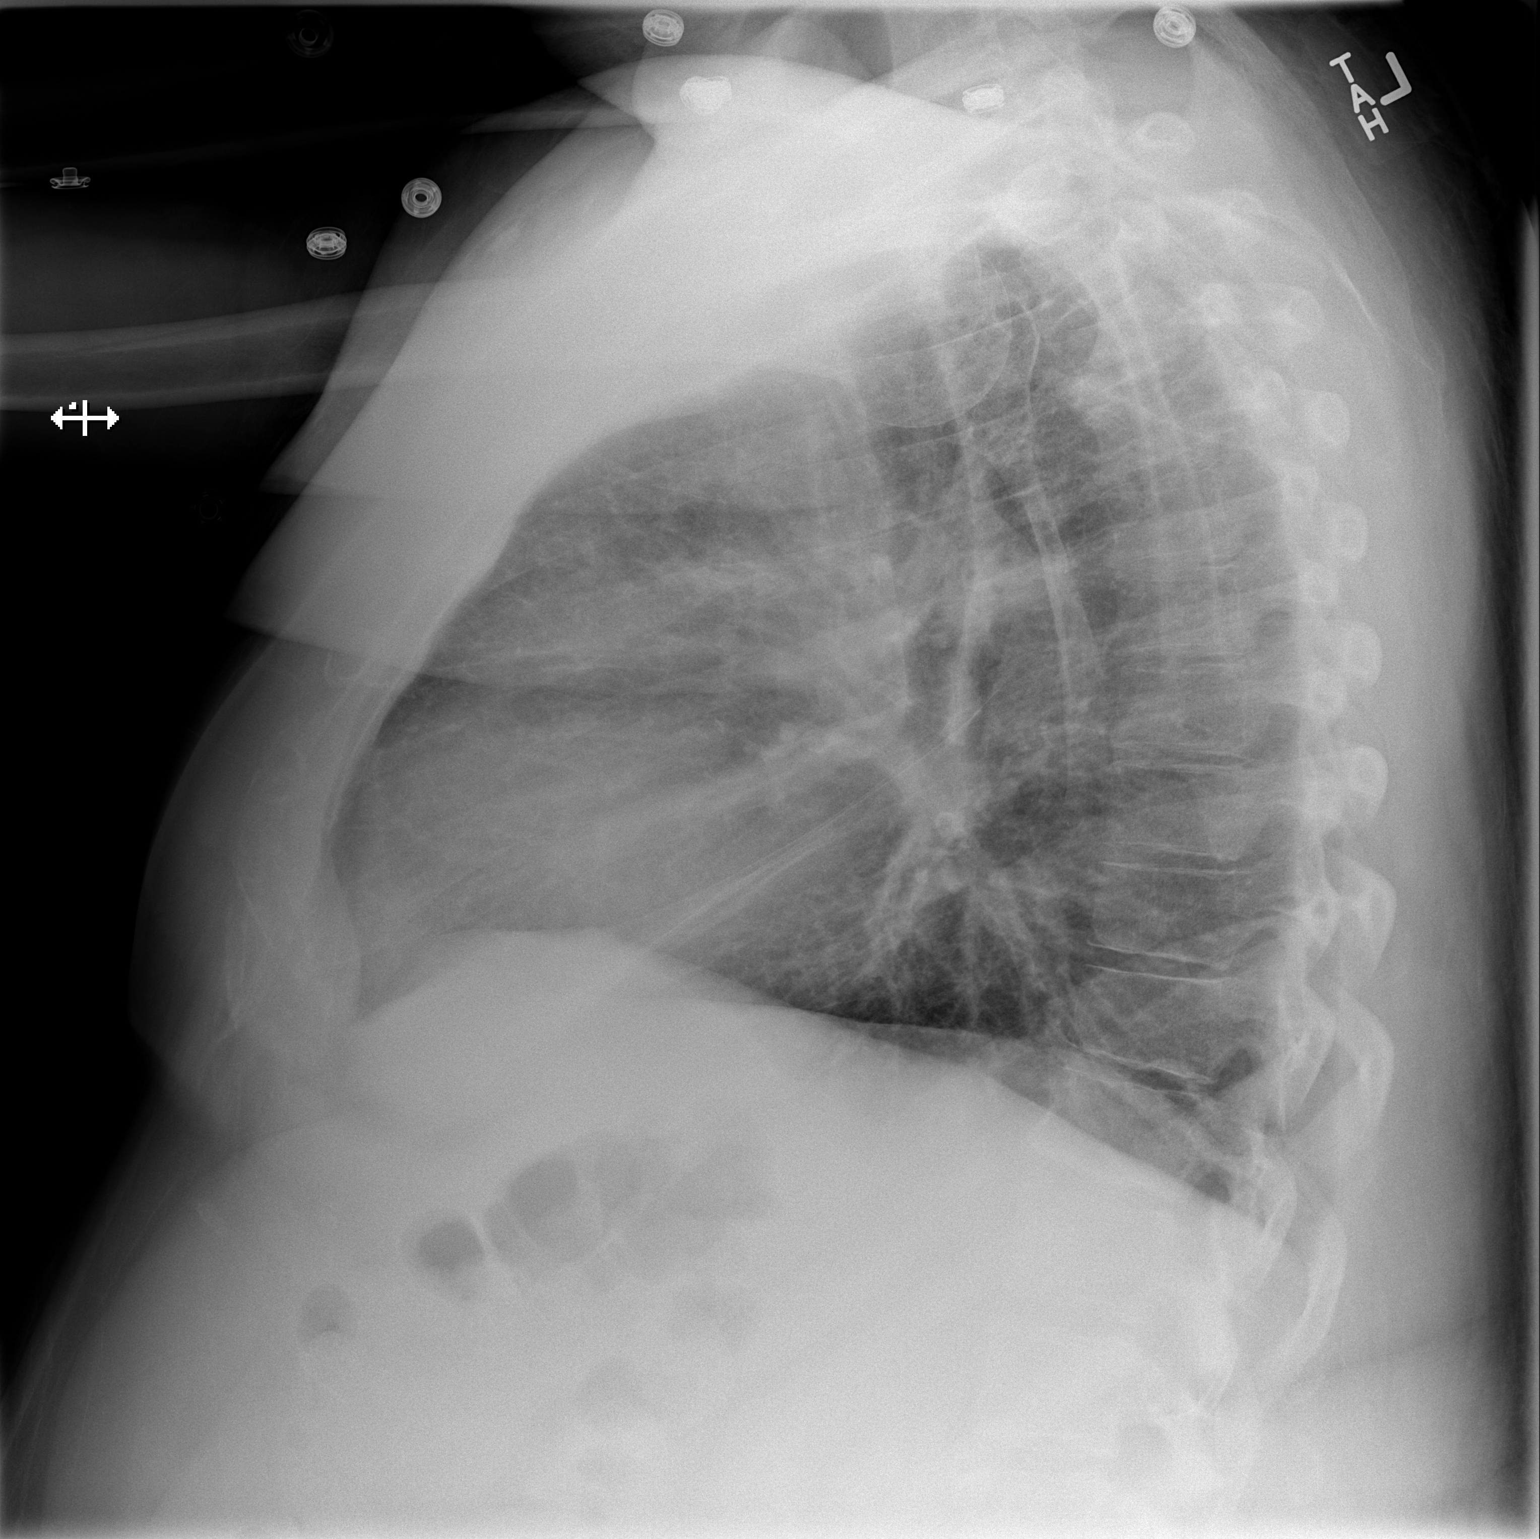

[2 of 2 positions shown; findings below may reference images not displayed]

FINDINGS: There has been further improvement in the interstitial
accentuation.  The patient does have prominent peribronchial
thickening bilaterally consistent with bronchitis.  No effusions.
No consolidation.

There is abnormal soft tissue density lateral to the arch of the
descending thoracic aorta which is new since the radiographs of
11/07/2011.  This may represent a large pulmonary artery and
appears to extend more superior than I would expect.  I recommend
CT scan of the chest with contrast for further evaluation.
IMPRESSION: 1.  Further improvement in the interstitial markings.  Residual
bronchitic changes.
2.  Fullness of the superior aspect the left hilum and
aortopulmonary window.  I recommend CT scan of the chest for
further evaluation.

## 2014-09-01 ENCOUNTER — Emergency Department (HOSPITAL_COMMUNITY): Payer: Medicare Other

## 2014-09-01 ENCOUNTER — Encounter (HOSPITAL_COMMUNITY): Payer: Self-pay

## 2014-09-01 ENCOUNTER — Inpatient Hospital Stay (HOSPITAL_COMMUNITY)
Admission: EM | Admit: 2014-09-01 | Discharge: 2014-09-10 | DRG: 981 | Disposition: A | Discharge status: Home / Self Care | Payer: Medicare Other | Attending: Family Medicine | Admitting: Family Medicine

## 2014-09-01 DIAGNOSIS — N2581 Secondary hyperparathyroidism of renal origin: Secondary | ICD-10-CM | POA: Diagnosis present

## 2014-09-01 DIAGNOSIS — E11311 Type 2 diabetes mellitus with unspecified diabetic retinopathy with macular edema: Secondary | ICD-10-CM | POA: Diagnosis present

## 2014-09-01 DIAGNOSIS — I5042 Chronic combined systolic (congestive) and diastolic (congestive) heart failure: Secondary | ICD-10-CM | POA: Diagnosis present

## 2014-09-01 DIAGNOSIS — Z823 Family history of stroke: Secondary | ICD-10-CM | POA: Diagnosis not present

## 2014-09-01 DIAGNOSIS — Z992 Dependence on renal dialysis: Secondary | ICD-10-CM | POA: Diagnosis not present

## 2014-09-01 DIAGNOSIS — D62 Acute posthemorrhagic anemia: Secondary | ICD-10-CM | POA: Insufficient documentation

## 2014-09-01 DIAGNOSIS — A419 Sepsis, unspecified organism: Secondary | ICD-10-CM | POA: Diagnosis present

## 2014-09-01 DIAGNOSIS — I120 Hypertensive chronic kidney disease with stage 5 chronic kidney disease or end stage renal disease: Secondary | ICD-10-CM | POA: Diagnosis present

## 2014-09-01 DIAGNOSIS — E1142 Type 2 diabetes mellitus with diabetic polyneuropathy: Secondary | ICD-10-CM | POA: Diagnosis present

## 2014-09-01 DIAGNOSIS — I252 Old myocardial infarction: Secondary | ICD-10-CM

## 2014-09-01 DIAGNOSIS — I2510 Atherosclerotic heart disease of native coronary artery without angina pectoris: Secondary | ICD-10-CM | POA: Diagnosis present

## 2014-09-01 DIAGNOSIS — N186 End stage renal disease: Secondary | ICD-10-CM | POA: Diagnosis present

## 2014-09-01 DIAGNOSIS — E872 Acidosis: Secondary | ICD-10-CM | POA: Diagnosis present

## 2014-09-01 DIAGNOSIS — E785 Hyperlipidemia, unspecified: Secondary | ICD-10-CM | POA: Diagnosis present

## 2014-09-01 DIAGNOSIS — IMO0001 Reserved for inherently not codable concepts without codable children: Secondary | ICD-10-CM | POA: Insufficient documentation

## 2014-09-01 DIAGNOSIS — F419 Anxiety disorder, unspecified: Secondary | ICD-10-CM | POA: Diagnosis present

## 2014-09-01 DIAGNOSIS — Z87891 Personal history of nicotine dependence: Secondary | ICD-10-CM

## 2014-09-01 DIAGNOSIS — Z6839 Body mass index (BMI) 39.0-39.9, adult: Secondary | ICD-10-CM

## 2014-09-01 DIAGNOSIS — F329 Major depressive disorder, single episode, unspecified: Secondary | ICD-10-CM | POA: Diagnosis present

## 2014-09-01 DIAGNOSIS — J449 Chronic obstructive pulmonary disease, unspecified: Secondary | ICD-10-CM | POA: Diagnosis present

## 2014-09-01 DIAGNOSIS — Z955 Presence of coronary angioplasty implant and graft: Secondary | ICD-10-CM

## 2014-09-01 DIAGNOSIS — E669 Obesity, unspecified: Secondary | ICD-10-CM | POA: Diagnosis present

## 2014-09-01 DIAGNOSIS — K659 Peritonitis, unspecified: Secondary | ICD-10-CM

## 2014-09-01 DIAGNOSIS — Z8673 Personal history of transient ischemic attack (TIA), and cerebral infarction without residual deficits: Secondary | ICD-10-CM | POA: Diagnosis not present

## 2014-09-01 DIAGNOSIS — G4733 Obstructive sleep apnea (adult) (pediatric): Secondary | ICD-10-CM | POA: Diagnosis present

## 2014-09-01 DIAGNOSIS — R1084 Generalized abdominal pain: Secondary | ICD-10-CM | POA: Insufficient documentation

## 2014-09-01 DIAGNOSIS — D649 Anemia, unspecified: Secondary | ICD-10-CM | POA: Diagnosis present

## 2014-09-01 DIAGNOSIS — E118 Type 2 diabetes mellitus with unspecified complications: Secondary | ICD-10-CM | POA: Diagnosis present

## 2014-09-01 DIAGNOSIS — R109 Unspecified abdominal pain: Secondary | ICD-10-CM | POA: Diagnosis present

## 2014-09-01 DIAGNOSIS — I472 Ventricular tachycardia: Secondary | ICD-10-CM | POA: Diagnosis not present

## 2014-09-01 DIAGNOSIS — T8571XA Infection and inflammatory reaction due to peritoneal dialysis catheter, initial encounter: Secondary | ICD-10-CM | POA: Diagnosis present

## 2014-09-01 DIAGNOSIS — K644 Residual hemorrhoidal skin tags: Secondary | ICD-10-CM | POA: Diagnosis present

## 2014-09-01 DIAGNOSIS — Z794 Long term (current) use of insulin: Secondary | ICD-10-CM | POA: Diagnosis not present

## 2014-09-01 DIAGNOSIS — K650 Generalized (acute) peritonitis: Secondary | ICD-10-CM | POA: Diagnosis present

## 2014-09-01 DIAGNOSIS — I4510 Unspecified right bundle-branch block: Secondary | ICD-10-CM | POA: Diagnosis present

## 2014-09-01 DIAGNOSIS — Z7951 Long term (current) use of inhaled steroids: Secondary | ICD-10-CM

## 2014-09-01 DIAGNOSIS — Y841 Kidney dialysis as the cause of abnormal reaction of the patient, or of later complication, without mention of misadventure at the time of the procedure: Secondary | ICD-10-CM | POA: Diagnosis present

## 2014-09-01 DIAGNOSIS — R509 Fever, unspecified: Secondary | ICD-10-CM | POA: Insufficient documentation

## 2014-09-01 DIAGNOSIS — K922 Gastrointestinal hemorrhage, unspecified: Secondary | ICD-10-CM | POA: Insufficient documentation

## 2014-09-01 LAB — COMPREHENSIVE METABOLIC PANEL
ALK PHOS: 65 U/L (ref 39–117)
ALT: 13 U/L (ref 0–53)
AST: 10 U/L (ref 0–37)
Albumin: 3 g/dL — ABNORMAL LOW (ref 3.5–5.2)
Anion gap: 25 — ABNORMAL HIGH (ref 5–15)
BUN: 94 mg/dL — ABNORMAL HIGH (ref 6–23)
CO2: 16 meq/L — AB (ref 19–32)
Calcium: 9.2 mg/dL (ref 8.4–10.5)
Chloride: 97 mEq/L (ref 96–112)
Creatinine, Ser: 11.51 mg/dL — ABNORMAL HIGH (ref 0.50–1.35)
GFR, EST AFRICAN AMERICAN: 5 mL/min — AB (ref >90)
GFR, EST NON AFRICAN AMERICAN: 4 mL/min — AB (ref >90)
Glucose, Bld: 292 mg/dL — ABNORMAL HIGH (ref 70–99)
POTASSIUM: 4.2 meq/L (ref 3.7–5.3)
SODIUM: 138 meq/L (ref 137–147)
Total Bilirubin: 0.4 mg/dL (ref 0.3–1.2)
Total Protein: 7.5 g/dL (ref 6.0–8.3)

## 2014-09-01 LAB — I-STAT VENOUS BLOOD GAS, ED
ACID-BASE DEFICIT: 5 mmol/L — AB (ref 0.0–2.0)
BICARBONATE: 20.8 meq/L (ref 20.0–24.0)
O2 SAT: 47 %
PCO2 VEN: 40.8 mmHg — AB (ref 45.0–50.0)
PO2 VEN: 28 mmHg — AB (ref 30.0–45.0)
TCO2: 22 mmol/L (ref 0–100)
pH, Ven: 7.316 — ABNORMAL HIGH (ref 7.250–7.300)

## 2014-09-01 LAB — CBC WITH DIFFERENTIAL/PLATELET
Basophils Absolute: 0 10*3/uL (ref 0.0–0.1)
Basophils Relative: 0 % (ref 0–1)
Eosinophils Absolute: 0.1 10*3/uL (ref 0.0–0.7)
Eosinophils Relative: 1 % (ref 0–5)
HCT: 19.3 % — ABNORMAL LOW (ref 39.0–52.0)
Hemoglobin: 6.2 g/dL — CL (ref 13.0–17.0)
LYMPHS PCT: 14 % (ref 12–46)
Lymphs Abs: 0.8 10*3/uL (ref 0.7–4.0)
MCH: 30.7 pg (ref 26.0–34.0)
MCHC: 32.1 g/dL (ref 30.0–36.0)
MCV: 95.5 fL (ref 78.0–100.0)
Monocytes Absolute: 0.8 10*3/uL (ref 0.1–1.0)
Monocytes Relative: 13 % — ABNORMAL HIGH (ref 3–12)
NEUTROS ABS: 4.2 10*3/uL (ref 1.7–7.7)
NEUTROS PCT: 71 % (ref 43–77)
PLATELETS: 223 10*3/uL (ref 150–400)
RBC: 2.02 MIL/uL — AB (ref 4.22–5.81)
RDW: 16.7 % — ABNORMAL HIGH (ref 11.5–15.5)
WBC: 5.8 10*3/uL (ref 4.0–10.5)

## 2014-09-01 LAB — PREPARE RBC (CROSSMATCH)

## 2014-09-01 LAB — I-STAT CG4 LACTIC ACID, ED
Lactic Acid, Venous: 1.82 mmol/L (ref 0.5–2.2)
Lactic Acid, Venous: 1.06 mmol/L (ref 0.5–2.2)

## 2014-09-01 MED ORDER — FLUTICASONE PROPIONATE 50 MCG/ACT NA SUSP
2.0000 | Freq: Every day | NASAL | Status: DC
  Administered 2014-09-03 – 2014-09-10 (×4): 2 via NASAL
  Filled 2014-09-01: qty 16

## 2014-09-01 MED ORDER — SODIUM CHLORIDE 0.9 % IV SOLN
Freq: Once | INTRAVENOUS | Status: AC
  Administered 2014-09-02: 01:00:00 via INTRAVENOUS

## 2014-09-01 MED ORDER — SODIUM BICARBONATE 8.4 % IV SOLN
50.0000 meq | Freq: Once | INTRAVENOUS | Status: AC
  Administered 2014-09-01: 50 meq via INTRAVENOUS
  Filled 2014-09-01: qty 50

## 2014-09-01 MED ORDER — TIOTROPIUM BROMIDE MONOHYDRATE 18 MCG IN CAPS
18.0000 ug | ORAL_CAPSULE | Freq: Every day | RESPIRATORY_TRACT | Status: DC
Start: 2014-09-02 — End: 2014-09-10
  Administered 2014-09-03 – 2014-09-10 (×7): 18 ug via RESPIRATORY_TRACT
  Filled 2014-09-01 (×2): qty 5

## 2014-09-01 MED ORDER — ATORVASTATIN CALCIUM 40 MG PO TABS
40.0000 mg | ORAL_TABLET | Freq: Every day | ORAL | Status: DC
  Administered 2014-09-02: 40 mg via ORAL
  Filled 2014-09-01 (×2): qty 1

## 2014-09-01 MED ORDER — HYDROMORPHONE HCL 1 MG/ML IJ SOLN
1.0000 mg | Freq: Once | INTRAMUSCULAR | Status: AC
  Administered 2014-09-01: 1 mg via INTRAVENOUS
  Filled 2014-09-01: qty 1

## 2014-09-01 MED ORDER — PIPERACILLIN-TAZOBACTAM 3.375 G IVPB 30 MIN: 3.3750 g | Freq: Once | INTRAVENOUS | Status: DC

## 2014-09-01 MED ORDER — HEPARIN SODIUM (PORCINE) 5000 UNIT/ML IJ SOLN
5000.0000 [IU] | Freq: Three times a day (TID) | INTRAMUSCULAR | Status: DC
Start: 2014-09-01 — End: 2014-09-01

## 2014-09-01 MED ORDER — LANTHANUM CARBONATE 500 MG PO CHEW
2000.0000 mg | CHEWABLE_TABLET | Freq: Three times a day (TID) | ORAL | Status: DC
  Filled 2014-09-01 (×4): qty 4

## 2014-09-01 MED ORDER — SODIUM CHLORIDE 0.9 % IV SOLN
250.0000 mL | INTRAVENOUS | Status: DC | PRN
  Administered 2014-09-02: 250 mL via INTRAVENOUS

## 2014-09-01 MED ORDER — HEPARIN SODIUM (PORCINE) 1000 UNIT/ML IJ SOLN
5000.0000 [IU] | Freq: Once | INTRAMUSCULAR | Status: AC
  Administered 2014-09-01: 5000 [IU] via INTRAPERITONEAL
  Filled 2014-09-01: qty 5

## 2014-09-01 MED ORDER — CARVEDILOL 12.5 MG PO TABS
37.5000 mg | ORAL_TABLET | Freq: Two times a day (BID) | ORAL | Status: DC
  Administered 2014-09-02: 37.5 mg via ORAL
  Filled 2014-09-01 (×3): qty 1

## 2014-09-01 MED ORDER — SODIUM CHLORIDE 0.9 % IJ SOLN
3.0000 mL | Freq: Two times a day (BID) | INTRAMUSCULAR | Status: DC
Start: 2014-09-01 — End: 2014-09-10
  Administered 2014-09-02 – 2014-09-10 (×9): 3 mL via INTRAVENOUS

## 2014-09-01 MED ORDER — INSULIN ASPART 100 UNIT/ML ~~LOC~~ SOLN
0.0000 [IU] | Freq: Three times a day (TID) | SUBCUTANEOUS | Status: DC
Start: 2014-09-02 — End: 2014-09-02
  Administered 2014-09-02: 3 [IU] via SUBCUTANEOUS

## 2014-09-01 MED ORDER — ACETAMINOPHEN 650 MG RE SUPP: 650.0000 mg | Freq: Four times a day (QID) | RECTAL | Status: DC | PRN

## 2014-09-01 MED ORDER — SODIUM CHLORIDE 0.9 % IJ SOLN
3.0000 mL | Freq: Two times a day (BID) | INTRAMUSCULAR | Status: DC
  Administered 2014-09-02 – 2014-09-05 (×6): 3 mL via INTRAVENOUS

## 2014-09-01 MED ORDER — ALBUTEROL SULFATE HFA 108 (90 BASE) MCG/ACT IN AERS: 2.0000 | INHALATION_SPRAY | RESPIRATORY_TRACT | Status: DC | PRN

## 2014-09-01 MED ORDER — INSULIN DETEMIR 100 UNIT/ML ~~LOC~~ SOLN
35.0000 [IU] | Freq: Two times a day (BID) | SUBCUTANEOUS | Status: DC
Start: 2014-09-02 — End: 2014-09-02
  Administered 2014-09-02: 35 [IU] via SUBCUTANEOUS
  Filled 2014-09-01 (×4): qty 0.35

## 2014-09-01 MED ORDER — SODIUM CHLORIDE 0.9 % IJ SOLN
3.0000 mL | INTRAMUSCULAR | Status: DC | PRN
  Administered 2014-09-03 – 2014-09-04 (×2): 3 mL via INTRAVENOUS
  Filled 2014-09-01 (×2): qty 3

## 2014-09-01 MED ORDER — VANCOMYCIN HCL 1000 MG IV SOLR
2500.0000 mg | INTRAVENOUS | Status: DC | PRN
  Administered 2014-09-01: 2500 mg via INTRAVENOUS
  Filled 2014-09-01: qty 2500

## 2014-09-01 MED ORDER — VANCOMYCIN HCL IN DEXTROSE 1-5 GM/200ML-% IV SOLN: 1000.0000 mg | Freq: Once | INTRAVENOUS | Status: DC

## 2014-09-01 MED ORDER — CALCITRIOL 0.25 MCG PO CAPS
0.2500 ug | ORAL_CAPSULE | ORAL | Status: DC
  Filled 2014-09-01: qty 1

## 2014-09-01 MED ORDER — CLOPIDOGREL BISULFATE 75 MG PO TABS: 75.0000 mg | ORAL_TABLET | Freq: Every day | ORAL | Status: DC

## 2014-09-01 MED ORDER — LACTULOSE 10 GM/15ML PO SOLN
30.0000 g | Freq: Three times a day (TID) | ORAL | Status: DC
  Administered 2014-09-02: 30 g via ORAL
  Filled 2014-09-01 (×5): qty 45

## 2014-09-01 MED ORDER — PIPERACILLIN-TAZOBACTAM IN DEX 2-0.25 GM/50ML IV SOLN
2.2500 g | Freq: Three times a day (TID) | INTRAVENOUS | Status: DC
  Administered 2014-09-02 – 2014-09-04 (×7): 2.25 g via INTRAVENOUS
  Filled 2014-09-01 (×9): qty 50

## 2014-09-01 MED ORDER — ACETAMINOPHEN 325 MG PO TABS
650.0000 mg | ORAL_TABLET | Freq: Four times a day (QID) | ORAL | Status: DC | PRN
  Administered 2014-09-02: 650 mg via ORAL
  Filled 2014-09-01: qty 2

## 2014-09-01 MED ORDER — DARBEPOETIN ALFA 200 MCG/0.4ML IJ SOSY
200.0000 ug | PREFILLED_SYRINGE | INTRAMUSCULAR | Status: DC
  Administered 2014-09-02 – 2014-09-09 (×2): 200 ug via SUBCUTANEOUS
  Filled 2014-09-01 (×3): qty 0.4

## 2014-09-01 MED ORDER — PIPERACILLIN-TAZOBACTAM IN DEX 2-0.25 GM/50ML IV SOLN
2.2500 g | INTRAVENOUS | Status: AC
  Administered 2014-09-01: 2.25 g via INTRAVENOUS
  Filled 2014-09-01: qty 50

## 2014-09-01 NOTE — H&P (Signed)
[facility] Admission History and Physical Service Pager: [phone]  Patient name: Jonathon Bailey Medical record number: [id number] Date of birth: 26-Sep-1955 Age: 58 y.o. Gender: male  Primary Care Provider: Thersa Salt, DO Consultants: Nephrology Code Status: FULL (discussed with patient on admission)  Chief Complaint: abdominal pain  Assessment and Plan: Jonathon Bailey is a 58 y.o. male presenting with generalized abdominal pain . PMH is significant for ESRD with peritoneal dialysis, T2DM, CAD, HTN, Chronic systolic and diastolic HF, h/o CVA, COPD  Abdominal pain: Peritoneal fluid cloudy and fever to 102F very suspicious for peritonitis. DDx: constipation (patient with manual disimpaction) vs GI bleed (h/o tarry stools) vs gastroenteritis vs SBO.  Abdominal xray with moderate stool burden but no acute findings. LA: 1.82>1.06. hgb 6.2 (baseline 7-8). Pt meets sepsis criteria with source of infection, tachycardia, tachypnea, and fever. -Admit to FPTS under Dr Nori Riis, stepdown  -CT abdomen pelvis pending -c/s to renal: Seen by Dr Lorrene Reid in ED.   -peritoneal fluid studies ordered: gram stain, cx, cytology -f/u blood cx -Vanc and Zosyn -lactulose to encourage movement of stool -monitor vitals closely in SDU, also keep on telemetry  Anemia: Baseline Hgb 7-8, currently with Hgb of 6.2. Report of dark tarry stool makes hx concerning for GI bleed. - Type and screen.  Transfuse 1 unit pRBCs now -f/u post transfusion CBC -check FOBT -await CT abd pelvis for intraabdominal pathology, consider GI consult pending those findings -hold home plavix for now, restart when able  ESRD: Receives daily peritoneal HD.  Sees Dr Elroy Channel.  Missed Sun and Tues sessions. Cr: 11.51 (baseline 10-11). Slightly acidotic on VBG pH 7.316 -c/s to renal for HD. Recommend 1 amp of bicarb -Continue home fosrenol and garamycin   Tachycardia: HR 120's in ED. BP stable.  EKG: RBBB,  LPFB but no signs of ischemia.  HR likely d/t sepsis. -management as above -Continue Coreg  Chronic systolic and diastolic HF: ECHO 123456: EF 40-45%. -Continue BB -Will limit IVF as able  HTN: BP stable at [place], hold home hydralazine at this time since BP's soft and pt septic  CAD & H/o CVA.: Patient without signs of ischemia on exam.  Neurologically in tact.  -Continue home Lipitor, Coreg -hold plavix at this time due to concern for GI bleed  T2DM: Last A1c: 7.4. On home levemir 75 u BID -Continue Levemir at decreased dose with SSI  COPD: -Continue Albuterol PRN & Spiriva daily  FEN/GI: SLIV, Renal diet Prophylaxis: SCD's (no heparin due to GI bleed)  Disposition: Admit to step down unit under Dr Nori Riis.  Discharge pending improvement in symptoms  History of Present Illness: Jonathon Bailey is a 58 y.o. male presenting with abdominal pain.  Patient states he's had a chest cold for about 2 months.  Takes a phospate binder.  Patient was constipated since Sunday.  Has not been able to have a BM.  Tried to digitally remove stool multiple times over the weekend and now experiencing rectal discomfort.  Used Rx given by Dr Elroy Channel and enema.  Patient finally able to pass 2 large hard stools.  Reports that bottom is in pain because of trying to manually disimpact himself.  Endorses black stools.  Patient is on home peritoneal dialysis and reports he did dialysis on Sat night.  Fluid was clear. Did not do dialysis Sun or Tues.  Patient states that he did not do dialysis because his belly was hurting from constipation.  Patient states  that he has had chills and fever starting today.  Patient was able to take some fluid off on Monday but it was so painful he did not try again on Tues.  Denies complication with exit site.  In ED, patient was febrile to 102.6 rectally.  He had chest and abd xray which were unrevealing, Cr 11.51, LA 1.8, VBG: pH 7.316, pCO2 40.8, WBC normal.   HD nurse was unsuccessful at getting any fluid off patient through his peritoneal HD site.  Blood cultures were obtained and patient was started on Vanc and Zosyn.  Patient also received 2 doses of Dilaudid 1mg  for pain.  Review Of Systems: Per HPI with the following additions: none Otherwise 12 point review of systems was performed and was unremarkable.  Patient Active Problem List   Diagnosis Date Noted  . Abdominal pain 09/01/2014  . CAPD (continuous ambulatory peritoneal dialysis) status 03/15/2014  . Morbid obesity 02/01/2014  . 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 Preventative health care 12/02/2013  . Other complications due to renal dialysis device, implant, and graft 10/24/2012  . COPD (chronic obstructive pulmonary disease) 10/23/2012  . DIABETIC PERIPHERAL NEUROPATHY 01/09/2010  . ESRD (end stage renal disease) on dialysis 08/30/2009  . CEREBROVASCULAR ACCIDENT, HX OF 08/30/2009  . Diabetes mellitus type 2 with complications Q000111Q  . HYPERLIPIDEMIA 09/21/2008  . OBSTRUCTIVE SLEEP APNEA 09/21/2008  . ESSENTIAL HYPERTENSION 09/21/2008  . CORONARY ARTERY DISEASE 09/21/2008  . Chronic combined systolic and diastolic heart failure Q000111Q   Past Medical History: Past Medical History  Diagnosis Date  . Hyperlipidemia   . Hypertension   . Arthritis   . Anxiety   . Depression   . Chronic systolic heart failure Q000111Q    ECHO Feb 2013 showed LVEF low normal at 50-55%, +hypokinetic anterolateral wall and inferolateral wall.    . Obesity   . Macular edema   . Diabetic retinopathy   . Type II diabetes mellitus   . Myocardial infarction     status post MI x2 and 3 stents placed in 2003  . Asthma     "as a child"  . Sleep apnea     "sleep w/CPAP sometimes" (03/15/2014)  . [id number])     "maybe monthly" (03/15/2014)  . Stroke ~ 2007; ~1987    "weak on right side; messed w/right side of brain; cry all  the time"  . CKD (chronic kidney disease) stage 4, GFR 15-29 ml/min 08/30/2009    Progressive renal failure since 2008, creatinine 1.2 in 2008 up to 3.5 in 2012 and 3.2-5.0 in 2013. All UA's 2011-13 showed >300 protein on dipstick. Work-up in May 2011 showed negative Urine IFE and SPEP, ultrasound showed 12-13 cm kidneys with increased echogenicity and UPC ratio was 1.5 gm proteinuria.  Hgb A1C's from 2011 to 2013 were all between 9-11.  Patient saw Dr. Donnetta Hutching (vasc surgery) for HD access in Aug 2013 > vein mapping was done and Dr. Donnetta Hutching felt the left arm (pt is R handed) was suitable for L arm Cimino radiocephalic fistula. Patient said he wasn't ready to consider doing dialysis and declined the surgery.     Marland Kitchen ESRD (end stage renal disease) on dialysis    Past Surgical History: Past Surgical History  Procedure Laterality Date  . Umbilical hernia repair    . Av fistula placement  10/17/2012    Procedure: ARTERIOVENOUS (AV) FISTULA CREATION;  Surgeon: Mal Misty, MD;  Location: [facility]  OR;  Service: Vascular;  Laterality: Left;  . Peritoneal catheter insertion  03/15/2014  . Hernia repair    . Incisional hernia repair  03/15/2014  . Laparoscopic lysis of adhesions  03/15/2014  . Coronary angioplasty with stent placement  ~ 2002    "3"  . Refractive surgery Bilateral   . Capd insertion N/A 03/15/2014    Procedure: LAPAROSCOPIC INSERTION CONTINUOUS AMBULATORY PERITONEAL DIALYSIS CATHETER, LAPARASCOPIC INCISIONAL HERNIA  REPAIR  WITH MESH, OMENTOPEXY AND LYSIS OF ADHESIONS;  Surgeon: [name], MD;  Location: [place] OR;  Service: General;  Laterality: N/A;   Social History: History  Substance Use Topics  . Smoking status: Former Smoker -- 1.00 packs/day for 15 years    Types: Cigarettes    Quit date: 09/24/1986  . Smokeless tobacco: Never Used  . Alcohol Use: Yes     Comment: stopped in 2013   Additional social history: denies smoking, ETOH, drug use  Please also refer to relevant sections of  EMR.  Family History: Family History  Problem Relation Age of Onset  . Asthma Mother   . Hyperlipidemia Mother   . Hypertension Mother   . Stroke Father   . Heart attack Father   . Prostate cancer Father   . Deep vein thrombosis Father   . Cancer Father   . Diabetes Father   . Hyperlipidemia Father   . Hypertension Father   . Other Father     varicose veins  . Heart disease Father     before age 66  . Other Sister     varicose veins   Allergies and Medications: Allergies  Allergen Reactions  . Feraheme [Ferumoxytol] Itching    Severe itching.   No current facility-administered medications on file prior to encounter.   Current Outpatient Prescriptions on File Prior to Encounter  Medication Sig Dispense Refill  . calcitRIOL (ROCALTROL) 0.25 MCG capsule Take 0.25-0.5 mcg by mouth daily. Alternates takes 1 tablet and 2 capsules the next day    . clopidogrel (PLAVIX) 75 MG tablet Take 75 mg by mouth daily.    . hydrALAZINE (APRESOLINE) 50 MG tablet Take 50 mg by mouth 3 (three) times daily.    . insulin detemir (LEVEMIR) 100 UNIT/ML injection Inject 75 Units into the skin 2 (two) times daily.    Marland Kitchen tiotropium (SPIRIVA) 18 MCG inhalation capsule Place 18 mcg into inhaler and inhale daily as needed (for shortness of breath).     Marland Kitchen albuterol (PROVENTIL HFA;VENTOLIN HFA) 108 (90 BASE) MCG/ACT inhaler Inhale 2 puffs into the lungs every 4 (four) hours as needed for wheezing. For wheezing    . HYDROcodone-acetaminophen (NORCO) 7.5-325 MG per tablet Take 1-2 tablets by mouth every 6 (six) hours as needed for moderate pain or severe pain. 50 tablet 0    Objective: BP 118/82 mmHg  Pulse 120  Temp(Src) 99.4 F (37.4 C) (Oral)  Resp 20  SpO2 95% Exam: General: awake, alert, lying in bed, uncomfortable looking but NAD HEENT: [place]/AT, EOMI, MM slightly dry Cardiovascular: regular rhythm but tachycardic, peripheral pulses palpable Respiratory: CTAB via anterior ausculation, no  increased WOB Abdomen: abdomen markedly distended and TTP throughout but more so in the RUQ, hypoactive BS, Catheter in place without external signs of infection/drainage but draining cloudy, yellow fluid from catheter itself Extremities: no edema, WWP Skin: dry, intact Neuro: no focal deficits  Labs and Imaging: CBC BMET   Recent Labs Lab 09/01/14 1808  WBC 5.8  HGB 6.2*  HCT 19.3*  PLT 223    Recent Labs Lab 09/01/14 1808  NA 138  K 4.2  CL 97  CO2 16*  BUN 94*  CREATININE 11.51*  GLUCOSE 292*  CALCIUM 9.2     Dg Abd Acute W/chest  09/01/2014   CLINICAL DATA:  Elevated blood pressure, lower abdominal pain  EXAM: ACUTE ABDOMEN SERIES (ABDOMEN 2 VIEW & CHEST 1 VIEW)  COMPARISON:  03/15/2014  FINDINGS: Cardiomediastinal silhouette is stable. No acute infiltrate or pleural effusion. No pulmonary edema. There is nonspecific nonobstructive bowel gas pattern. Peritoneal dialysis catheter is noted. Moderate stool and nonspecific air-fluid level noted in right colon.  IMPRESSION: Negative abdominal radiographs.  No acute cardiopulmonary disease.   Electronically Signed   By: Lahoma Crocker M.D.   On: 09/01/2014 20:17    Janora Norlander, DO 09/01/2014, 8:59 PM PGY-1, [place] Intern pager: [phone], text pages welcome  Upper Level Addendum:  I have seen and evaluated this patient along with Dr. Lajuana Ripple and reviewed the above note, making necessary revisions in pink.   Chrisandra Netters, MD Family Medicine PGY-3

## 2014-09-01 NOTE — Progress Notes (Signed)
ANTIBIOTIC CONSULT NOTE - INITIAL  Pharmacy Consult for vancomycin ad zosyn Indication: rule out sepsis  Allergies  Allergen Reactions  . Feraheme [Ferumoxytol] Itching    Severe itching.    Patient Measurements: height 71 inches, weight 127 kg (per patient)    Vital Signs:   Intake/Output from previous day:   Intake/Output from this shift:    Labs: No results for input(s): WBC, HGB, PLT, LABCREA, CREATININE in the last 72 hours. CrCl cannot be calculated (Unknown ideal weight.). No results for input(s): VANCOTROUGH, VANCOPEAK, VANCORANDOM, GENTTROUGH, GENTPEAK, GENTRANDOM, TOBRATROUGH, TOBRAPEAK, TOBRARND, AMIKACINPEAK, AMIKACINTROU, AMIKACIN in the last 72 hours.   Microbiology: No results found for this or any previous visit (from the past 720 hour(s)).  Medical History: Past Medical History  Diagnosis Date  . Hyperlipidemia   . Hypertension   . Arthritis   . Anxiety   . Depression   . Chronic systolic heart failure Q000111Q    ECHO Feb 2013 showed LVEF low normal at 50-55%, +hypokinetic anterolateral wall and inferolateral wall.    . Obesity   . Macular edema   . Diabetic retinopathy   . Type II diabetes mellitus   . Myocardial infarction     status post MI x2 and 3 stents placed in 2003  . Asthma     "as a child"  . Sleep apnea     "sleep w/CPAP sometimes" (03/15/2014)  . [id number])     "maybe monthly" (03/15/2014)  . Stroke ~ 2007; ~1987    "weak on right side; messed w/right side of brain; cry all the time"  . CKD (chronic kidney disease) stage 4, GFR 15-29 ml/min 08/30/2009    Progressive renal failure since 2008, creatinine 1.2 in 2008 up to 3.5 in 2012 and 3.2-5.0 in 2013. All UA's 2011-13 showed >300 protein on dipstick. Work-up in May 2011 showed negative Urine IFE and SPEP, ultrasound showed 12-13 cm kidneys with increased echogenicity and UPC ratio was 1.5 gm proteinuria.  Hgb A1C's from 2011 to 2013 were all between 9-11.  Patient saw Dr. Donnetta Hutching  (vasc surgery) for HD access in Aug 2013 > vein mapping was done and Dr. Donnetta Hutching felt the left arm (pt is R handed) was suitable for L arm Cimino radiocephalic fistula. Patient said he wasn't ready to consider doing dialysis and declined the surgery.     Marland Kitchen ESRD (end stage renal disease) on dialysis     Assessment: Patient is a 58 y.o M with ESRD on PD.  He presented to the ED today with abdominal distension and fever. To start broad abx for suspected sepsis.  Goal of Therapy:  Vancomycin trough level 15-20 mcg/ml  Plan:  1) change zosyn to 2.25gm IV q8h 2) vancomycin 2500mg  IV x1.  Will check level in 3-5 days and redose if <20   Clarance Bollard P 09/01/2014,6:31 PM

## 2014-09-01 NOTE — ED Notes (Signed)
[facility]- pt coming from EMS from dialysis. Was sent from dialysis for fever and elevated BP. Dialysis unable to get any fluid off of pt. Pt does have history of clogged dialysis access. Abd distention noted on arrival. CBG of 401 with EMS. Pt noted to have temp of 100.8 tympanically with EMS.

## 2014-09-01 NOTE — ED Provider Notes (Signed)
CSN: [id number]     Arrival date & time 09/01/14  1737 History   First MD Initiated Contact with Patient 09/01/14 1737     Chief Complaint  Patient presents with  . Abdominal Pain  . Fever     (Consider location/radiation/quality/duration/timing/severity/associated sxs/prior Treatment) Patient is a 58 y.o. male presenting with abdominal pain and fever. The history is limited by the condition of the patient.  Abdominal Pain Pain location:  Generalized Pain quality: aching   Pain radiates to:  Does not radiate Pain severity:  Moderate Onset quality:  Gradual Duration:  3 days Timing:  Constant Progression:  Worsening Chronicity:  Recurrent Context comment:  Peritoneal dialysis Relieved by:  Nothing Worsened by:  Nothing tried Ineffective treatments:  None tried Associated symptoms: constipation, fever and nausea   Associated symptoms: no diarrhea, no hematuria and no vomiting   Fever Associated symptoms: nausea   Associated symptoms: no diarrhea and no vomiting     Past Medical History  Diagnosis Date  . Hyperlipidemia   . Hypertension   . Arthritis   . Anxiety   . Depression   . Chronic systolic heart failure [id number]    ECHO Feb 2013 showed LVEF low normal at 50-55%, +hypokinetic anterolateral wall and inferolateral wall.    . Obesity   . Macular edema   . Diabetic retinopathy   . Myocardial infarction     status post MI x2 and 3 stents placed in 2003  . Asthma     "as a child"  . Sleep apnea     "sleep w/CPAP sometimes" (03/15/2014)  . [id number])     "maybe monthly" (03/15/2014)  . Stroke ~ 2007; ~1987    "weak on right side; messed w/right side of brain; cry all the time"  . CKD (chronic kidney disease) stage 4, GFR 15-29 ml/min 08/30/2009    Progressive renal failure since 2008, creatinine 1.2 in 2008 up to 3.5 in 2012 and 3.2-5.0 in 2013. All UA's 2011-13 showed >300 protein on dipstick. Work-up in May 2011 showed negative Urine IFE and SPEP, ultrasound  showed 12-13 cm kidneys with increased echogenicity and UPC ratio was 1.5 gm proteinuria.  Hgb A1C's from 2011 to 2013 were all between 9-11.  Patient saw Dr. Donnetta Hutching (vasc surgery) for HD access in Aug 2013 > vein mapping was done and Dr. Donnetta Hutching felt the left arm (pt is R handed) was suitable for L arm Cimino radiocephalic fistula. Patient said he wasn't ready to consider doing dialysis and declined the surgery.     Marland Kitchen ESRD (end stage renal disease) on dialysis   . Type II diabetes mellitus     insulin dependent   Past Surgical History  Procedure Laterality Date  . Umbilical hernia repair    . Av fistula placement  10/17/2012    Procedure: ARTERIOVENOUS (AV) FISTULA CREATION;  Surgeon: Mal Misty, MD;  Location: [facility];  Service: Vascular;  Laterality: Left;  . Peritoneal catheter insertion  03/15/2014  . Hernia repair    . Incisional hernia repair  03/15/2014  . Laparoscopic lysis of adhesions  03/15/2014  . Coronary angioplasty with stent placement  ~ 2002    "3"  . Refractive surgery Bilateral   . Capd insertion N/A 03/15/2014    Procedure: LAPAROSCOPIC INSERTION CONTINUOUS AMBULATORY PERITONEAL DIALYSIS CATHETER, LAPARASCOPIC INCISIONAL HERNIA  REPAIR  WITH MESH, OMENTOPEXY AND LYSIS OF ADHESIONS;  Surgeon: [name], MD;  Location: [facility];  Service: General;  Laterality: N/A;  .  Shuntogram N/A 03/11/2013    Procedure: Fistulogram;  Surgeon: Conrad Etna, MD;  Location: [facility] CATH LAB;  Service: Cardiovascular;  Laterality: N/A;   Family History  Problem Relation Age of Onset  . Asthma Mother   . Hyperlipidemia Mother   . Hypertension Mother   . Stroke Father   . Heart attack Father   . Prostate cancer Father   . Deep vein thrombosis Father   . Cancer Father   . Diabetes Father   . Hyperlipidemia Father   . Hypertension Father   . Other Father     varicose veins  . Heart disease Father     before age 4  . Other Sister     varicose veins   History  Substance Use Topics  .  Smoking status: Former Smoker -- 1.00 packs/day for 15 years    Types: Cigarettes    Quit date: 09/24/1986  . Smokeless tobacco: Never Used  . Alcohol Use: Yes     Comment: stopped in 2013    Review of Systems  Constitutional: Positive for fever.  Gastrointestinal: Positive for nausea, abdominal pain and constipation. Negative for vomiting and diarrhea.  Genitourinary: Negative for hematuria.  All other systems reviewed and are negative.     Allergies  Feraheme  Home Medications   Prior to Admission medications   Medication Sig Start Date End Date Taking? Authorizing Provider  atorvastatin (LIPITOR) 40 MG tablet Take 40 mg by mouth at bedtime.   Yes Historical Provider, MD  calcitRIOL (ROCALTROL) 0.25 MCG capsule Take 0.25-0.5 mcg by mouth daily. Alternates takes 1 tablet and 2 capsules the next day   Yes Historical Provider, MD  carvedilol (COREG) 25 MG tablet Take 37.5 mg by mouth 2 (two) times daily with a meal.   Yes Historical Provider, MD  clopidogrel (PLAVIX) 75 MG tablet Take 75 mg by mouth daily.   Yes Historical Provider, MD  fluticasone (FLONASE) 50 MCG/ACT nasal spray Place 2 sprays into both nostrils daily.   Yes Historical Provider, MD  gentamicin cream (GARAMYCIN) 0.1 % Apply 1 application topically daily. For dialysis site   Yes Historical Provider, MD  hydrALAZINE (APRESOLINE) 50 MG tablet Take 50 mg by mouth 3 (three) times daily.   Yes Historical Provider, MD  insulin detemir (LEVEMIR) 100 UNIT/ML injection Inject 75 Units into the skin 2 (two) times daily.   Yes Historical Provider, MD  lanthanum (FOSRENOL) 1000 MG chewable tablet Chew 1,000-2,000 mg by mouth 3 (three) times daily with meals. Takes 2 tabs with meals and 1-2 tabs with snacks   Yes Historical Provider, MD  tiotropium (SPIRIVA) 18 MCG inhalation capsule Place 18 mcg into inhaler and inhale daily as needed (for shortness of breath).    Yes Historical Provider, MD  tobramycin (TOBREX) 0.3 % ophthalmic  solution Place 1 drop into both eyes every 6 (six) hours as needed (for eye irritation).    Yes Historical Provider, MD  albuterol (PROVENTIL HFA;VENTOLIN HFA) 108 (90 BASE) MCG/ACT inhaler Inhale 2 puffs into the lungs every 4 (four) hours as needed for wheezing. For wheezing    Historical Provider, MD  HYDROcodone-acetaminophen (NORCO) 7.5-325 MG per tablet Take 1-2 tablets by mouth every 6 (six) hours as needed for moderate pain or severe pain. 04/28/14   Michael Boston, MD   BP 160/79 mmHg  Pulse 91  Temp(Src) 97.3 F (36.3 C) (Oral)  Resp 13  Ht 5\' 11"  (1.803 m)  Wt 289 lb 11 oz (131.4 kg)  BMI 40.42 kg/m2  SpO2 96% Physical Exam  Constitutional: He is oriented to person, place, and time. He appears well-developed and well-nourished.  HENT:  Head: Normocephalic and atraumatic.  Eyes: Conjunctivae and EOM are normal.  Neck: Normal range of motion. Neck supple.  Cardiovascular: Normal rate, regular rhythm and normal heart sounds.   Pulmonary/Chest: Effort normal and breath sounds normal. No respiratory distress.  Abdominal: He exhibits distension. There is generalized tenderness. There is rigidity. There is no rebound and no guarding.  Musculoskeletal: Normal range of motion.  Neurological: He is alert and oriented to person, place, and time.  Skin: Skin is warm and dry.  Vitals reviewed.   ED Course  Procedures (including critical care time) Labs Review Labs Reviewed  CBC WITH DIFFERENTIAL - Abnormal; Notable for the following:    RBC 2.02 (*)    Hemoglobin 6.2 (*)    HCT 19.3 (*)    RDW 16.7 (*)    Monocytes Relative 13 (*)    All other components within normal limits  COMPREHENSIVE METABOLIC PANEL - Abnormal; Notable for the following:    CO2 16 (*)    Glucose, Bld 292 (*)    BUN 94 (*)    Creatinine, Ser 11.51 (*)    Albumin 3.0 (*)    GFR calc non Af Amer 4 (*)    GFR calc Af Amer 5 (*)    Anion gap 25 (*)    All other components within normal limits  URINALYSIS,  ROUTINE W REFLEX MICROSCOPIC - Abnormal; Notable for the following:    Glucose, UA 500 (*)    Hgb urine dipstick MODERATE (*)    Protein, ur >300 (*)    All other components within normal limits  BODY FLUID CELL COUNT WITH DIFFERENTIAL - Abnormal; Notable for the following:    Color, Fluid YELLOW (*)    Appearance, Fluid TURBID (*)    WBC, Fluid 9750 (*)    Neutrophil Count, Fluid 89 (*)    Monocyte-Macrophage-Serous Fluid 10 (*)    All other components within normal limits  OCCULT BLOOD X 1 CARD TO LAB, STOOL - Abnormal; Notable for the following:    Fecal Occult Bld POSITIVE (*)    All other components within normal limits  CBC - Abnormal; Notable for the following:    RBC 2.39 (*)    Hemoglobin 7.1 (*)    HCT 22.3 (*)    RDW 16.1 (*)    All other components within normal limits  IRON AND TIBC - Abnormal; Notable for the following:    Iron 15 (*)    TIBC 150 (*)    Saturation Ratios 10 (*)    All other components within normal limits  RENAL FUNCTION PANEL - Abnormal; Notable for the following:    Glucose, Bld 194 (*)    BUN 94 (*)    Creatinine, Ser 11.28 (*)    Phosphorus 6.0 (*)    Albumin 2.8 (*)    GFR calc non Af Amer 4 (*)    GFR calc Af Amer 5 (*)    Anion gap 23 (*)    All other components within normal limits  URINE MICROSCOPIC-ADD ON - Abnormal; Notable for the following:    Bacteria, UA MANY (*)    All other components within normal limits  CBC - Abnormal; Notable for the following:    RBC 2.35 (*)    Hemoglobin 7.1 (*)    HCT 21.9 (*)  RDW 16.3 (*)    All other components within normal limits  GLUCOSE, CAPILLARY - Abnormal; Notable for the following:    Glucose-Capillary 217 (*)    All other components within normal limits  GLUCOSE, CAPILLARY - Abnormal; Notable for the following:    Glucose-Capillary 188 (*)    All other components within normal limits  GLUCOSE, CAPILLARY - Abnormal; Notable for the following:    Glucose-Capillary 154 (*)    All  other components within normal limits  GLUCOSE, CAPILLARY - Abnormal; Notable for the following:    Glucose-Capillary 139 (*)    All other components within normal limits  GLUCOSE, CAPILLARY - Abnormal; Notable for the following:    Glucose-Capillary 145 (*)    All other components within normal limits  GLUCOSE, CAPILLARY - Abnormal; Notable for the following:    Glucose-Capillary 122 (*)    All other components within normal limits  I-STAT VENOUS BLOOD GAS, ED - Abnormal; Notable for the following:    pH, Ven 7.316 (*)    pCO2, Ven 40.8 (*)    pO2, Ven 28.0 (*)    Acid-base deficit 5.0 (*)    All other components within normal limits  CBG MONITORING, ED - Abnormal; Notable for the following:    Glucose-Capillary 194 (*)    All other components within normal limits  CULTURE, BLOOD (ROUTINE X 2)  CULTURE, BLOOD (ROUTINE X 2)  BODY FLUID CULTURE  GRAM STAIN  MRSA PCR SCREENING  URINE CULTURE  TROPONIN I  PATHOLOGIST SMEAR REVIEW  I-STAT CG4 LACTIC ACID, ED  I-STAT CG4 LACTIC ACID, ED  TYPE AND SCREEN  PREPARE RBC (CROSSMATCH)  PREPARE RBC (CROSSMATCH)    Imaging Review Ct Abdomen Pelvis Wo Contrast  09/02/2014   CLINICAL DATA:  Abdominal pain and distention. Stent from dialysis for fever and elevated blood pressure.  EXAM: CT ABDOMEN AND PELVIS WITHOUT CONTRAST  TECHNIQUE: Multidetector CT imaging of the abdomen and pelvis was performed following the standard protocol without IV contrast.  COMPARISON:  None.  FINDINGS: Atelectasis in the lung bases.  Coronary artery calcifications.  Small amount of free fluid in the upper abdomen, extending along the pericolic gutters to the pelvis. Small amount of free air demonstrated in the upper abdomen with tiny gas bubble noted. A pelvic peritoneal dialysis catheter is noted to be in place, likely accounting for these changes. The unenhanced appearance of the liver, spleen, gallbladder, pancreas, adrenal glands, kidneys, abdominal aorta,  inferior vena cava, and retroperitoneal lymph nodes is unremarkable. Stomach is decompressed. Stool-filled colon without distention. Prominent visceral adipose tissues.  Pelvis: Bladder wall is mildly thickened. Bladder infection not excluded. Prostate gland is not enlarged. No pelvic mass or lymphadenopathy. Appendix is not identified and may be surgically absent. Small periumbilical hernia containing fat and small bowel. Proximal small bowel are contrast filled with upper limits of normal caliber, measuring about 2.9 cm diameter. Distal small bowel loops are decompressed. Early or partial obstruction not entirely excluded. Degenerative changes in the lumbar spine. No destructive bone lesions appreciated.  IMPRESSION: Small amount of free fluid in the upper abdomen and pelvis with minimal free air demonstrated, likely related to pelvic peritoneal dialysis catheter. Small periumbilical hernia containing fat and small bowel. Contrast filled bowel demonstrate upper limits of normal caliber. Distal bowel are decompressed. Early or partial small bowel obstruction not excluded.   Electronically Signed   By: Lucienne Capers M.D.   On: 09/02/2014 02:01   Dg Abd Acute W/chest  09/01/2014   CLINICAL DATA:  Elevated blood pressure, lower abdominal pain  EXAM: ACUTE ABDOMEN SERIES (ABDOMEN 2 VIEW & CHEST 1 VIEW)  COMPARISON:  03/15/2014  FINDINGS: Cardiomediastinal silhouette is stable. No acute infiltrate or pleural effusion. No pulmonary edema. There is nonspecific nonobstructive bowel gas pattern. Peritoneal dialysis catheter is noted. Moderate stool and nonspecific air-fluid level noted in right colon.  IMPRESSION: Negative abdominal radiographs.  No acute cardiopulmonary disease.   Electronically Signed   By: Lahoma Crocker M.D.   On: 09/01/2014 20:17     EKG Interpretation   Date/Time:  Wednesday September 01 2014 17:54:12 EST Ventricular Rate:  125 PR Interval:  130 QRS Duration: 139 QT Interval:  354 QTC  Calculation: 510 R Axis:   168 Text Interpretation:  Sinus tachycardia RBBB and LPFB SINCE LAST TRACING  HEART RATE HAS INCREASED Confirmed by Debby Freiberg [phone]) on 09/01/2014  6:27:12 PM      CRITICAL CARE Performed by: Debby Freiberg   Total critical care time: 35 min  Critical care time was exclusive of separately billable procedures and treating other patients.  Critical care was necessary to treat or prevent imminent or life-threatening deterioration.  Critical care was time spent personally by me on the following activities: development of treatment plan with patient and/or surrogate as well as nursing, discussions with consultants, evaluation of patient's response to treatment, examination of patient, obtaining history from patient or surrogate, ordering and performing treatments and interventions, ordering and review of laboratory studies, ordering and review of radiographic studies, pulse oximetry and re-evaluation of patient's condition.   MDM   Final diagnoses:  Abdominal pain    58 y.o. male with pertinent PMH of ESRD on PD (last dialysis Sunday) presents with abd pain, fever to 102, tachycardia, tachypnea, concerning for sepsis.  Code sepsis level 2 called.  Deferred fluids 2/2 ESRD status.  Vanc/zosyn ordered.  Labs consistent with SBP.  Consulted medicine for admission.  1. Abdominal pain         Debby Freiberg, MD 09/03/14 0025

## 2014-09-01 NOTE — Consult Note (Signed)
[facility] Consultation Note Requesting Physician:  Zeba Reason for Consult:  Pt with ESRD on PD with abdominal pain and malfunctioning PD catheter HPI: The patient is a 58 y.o. year-old AAM with ESRD, most recently on CCPD - followed by Dr. Marval Regal.  Last did his PD on Sunday. Noted development of severe constipation and abdominal pain.  Tried laxatives and manual disimpaction with some results. Says stool was black and tarry. Last attempted to dwell PD fluid on Monday - instilled a 3 liter exchange, but had such severe abdominal pain he quickly drained out 2500 of the 3000. Did not notice if fluid cloudy or clear (says was clear on Sunday). [facility] the [facility] for pain medications and was asked to come in on Monday for PD fluid cell count and culture, did not come until today. A flush for cell count was attempted. 2 liters went in easily, no return at all.  Heparin was instilled, still no return. Sent to the ED for ATB's and further evaluation.   He reports fevers today with shaking chills and continued diffuse abdominal pain. Exit site has been fine.    In the ED we attempted to drain patient - greenish milky fluid was obtained but only enough for culture, cell count. Full return of the earlier 2 liter exchange not accomplished.  KUB showed catheter in the right pelvis - did not look malpositioned. CT pending. Other pertinent labs included a Hb of 6.2 (was 7.7 on 12/3 and pt received Mircera 50 mcg on 12/3) (dose increased to 200 and next dose was due 12/15   PD prescription:  CCPD 5X3 liter exchanges with 1hour 11min dwell time, last fill of 2500 ml, daytime dwell of 3 liters. Backup access - none (failed AVF)  Past Medical History  Diagnosis Date  . Hyperlipidemia   . Hypertension   . Arthritis   . Anxiety   . Depression   . Chronic systolic heart failure [id number]    ECHO Feb 2013 showed LVEF low normal at 50-55%, +hypokinetic anterolateral wall and  inferolateral wall.    . Obesity   . Macular edema   . Diabetic retinopathy   . Type II diabetes mellitus   . Myocardial infarction     status post MI x2 and 3 stents placed in 2003  . Asthma     "as a child"  . Sleep apnea     "sleep w/CPAP sometimes" (03/15/2014)  . [id number])     "maybe monthly" (03/15/2014)  . Stroke ~ 2007; ~1987    "weak on right side; messed w/right side of brain; cry all the time"  . CKD (chronic kidney disease) stage 4, GFR 15-29 ml/min 08/30/2009    Progressive renal failure since 2008, creatinine 1.2 in 2008 up to 3.5 in 2012 and 3.2-5.0 in 2013. All UA's 2011-13 showed >300 protein on dipstick. Work-up in May 2011 showed negative Urine IFE and SPEP, ultrasound showed 12-13 cm kidneys with increased echogenicity and UPC ratio was 1.5 gm proteinuria.  Hgb A1C's from 2011 to 2013 were all between 9-11.  Patient saw Dr. Donnetta Hutching (vasc surgery) for HD access in Aug 2013 > vein mapping was done and Dr. Donnetta Hutching felt the left arm (pt is R handed) was suitable for L arm Cimino radiocephalic fistula. Patient said he wasn't ready to consider doing dialysis and declined the surgery.     Marland Kitchen ESRD (end stage renal disease) on dialysis     Past Surgical History  Procedure  Laterality Date  . Umbilical hernia repair    . Av fistula placement  10/17/2012    Procedure: ARTERIOVENOUS (AV) FISTULA CREATION;  Surgeon: Mal Misty, MD;  Location: [facility];  Service: Vascular;  Laterality: Left;  . Peritoneal catheter insertion  03/15/2014  . Hernia repair    . Incisional hernia repair  03/15/2014  . Laparoscopic lysis of adhesions  03/15/2014  . Coronary angioplasty with stent placement  ~ 2002    "3"  . Refractive surgery Bilateral   . Capd insertion N/A 03/15/2014    Procedure: LAPAROSCOPIC INSERTION CONTINUOUS AMBULATORY PERITONEAL DIALYSIS CATHETER, LAPARASCOPIC INCISIONAL HERNIA  REPAIR  WITH MESH, OMENTOPEXY AND LYSIS OF ADHESIONS;  Surgeon: [name], MD;  Location: [facility];   Service: General;  Laterality: N/A;    Family History  Problem Relation Age of Onset  . Asthma Mother   . Hyperlipidemia Mother   . Hypertension Mother   . Stroke Father   . Heart attack Father   . Prostate cancer Father   . Deep vein thrombosis Father   . Cancer Father   . Diabetes Father   . Hyperlipidemia Father   . Hypertension Father   . Other Father     varicose veins  . Heart disease Father     before age 57  . Other Sister     varicose veins   Social History:  reports that he quit smoking about 27 years ago. His smoking use included Cigarettes. He has a 15 pack-year smoking history. He has never used smokeless tobacco. He reports that he drinks alcohol. He reports that he does not use illicit drugs.  Allergies:  Allergies  Allergen Reactions  . Feraheme [Ferumoxytol] Itching    Severe itching.    Home medications: Prior to Admission medications   Medication Sig Start Date End Date Taking? Authorizing Provider  atorvastatin (LIPITOR) 40 MG tablet Take 40 mg by mouth at bedtime.   Yes Historical Provider, MD  calcitRIOL (ROCALTROL) 0.25 MCG capsule Take 0.25-0.5 mcg by mouth daily. Alternates takes 1 tablet and 2 capsules the next day   Yes Historical Provider, MD  carvedilol (COREG) 25 MG tablet Take 37.5 mg by mouth 2 (two) times daily with a meal.   Yes Historical Provider, MD  clopidogrel (PLAVIX) 75 MG tablet Take 75 mg by mouth daily.   Yes Historical Provider, MD  fluticasone (FLONASE) 50 MCG/ACT nasal spray Place 2 sprays into both nostrils daily.   Yes Historical Provider, MD  gentamicin cream (GARAMYCIN) 0.1 % Apply 1 application topically daily. For dialysis site   Yes Historical Provider, MD  hydrALAZINE (APRESOLINE) 50 MG tablet Take 50 mg by mouth 3 (three) times daily.   Yes Historical Provider, MD  insulin detemir (LEVEMIR) 100 UNIT/ML injection Inject 75 Units into the skin 2 (two) times daily.   Yes Historical Provider, MD  lanthanum (FOSRENOL) 1000  MG chewable tablet Chew 1,000-2,000 mg by mouth 3 (three) times daily with meals. Takes 2 tabs with meals and 1-2 tabs with snacks   Yes Historical Provider, MD  tiotropium (SPIRIVA) 18 MCG inhalation capsule Place 18 mcg into inhaler and inhale daily as needed (for shortness of breath).    Yes Historical Provider, MD  tobramycin (TOBREX) 0.3 % ophthalmic solution Place 1 drop into both eyes every 6 (six) hours as needed (for eye irritation).    Yes Historical Provider, MD  albuterol (PROVENTIL HFA;VENTOLIN HFA) 108 (90 BASE) MCG/ACT inhaler Inhale 2 puffs into  the lungs every 4 (four) hours as needed for wheezing. For wheezing    Historical Provider, MD  HYDROcodone-acetaminophen (NORCO) 7.5-325 MG per tablet Take 1-2 tablets by mouth every 6 (six) hours as needed for moderate pain or severe pain. 04/28/14   Michael Boston, MD    Inpatient medications:  Orders pending  Review of Systems Gen:  Denies headache + fever and chills. HEENT:  No visual change, sore throat, difficulty swallowing. Resp:  No difficulty breathing, DOE.  No cough or hemoptysis. Cardiac:  No chest pain, orthopnea, PND.  Denies edema. GI:   + abd pain, black stools, severe constipation. No drainage from exit site GU:  Denies difficulty or change in voiding.  No change in urine color.     MS:  Denies joint pain or swelling.   Derm:  Denies skin rash or itching.  No chronic skin conditions.  Neuro:   Denies focal weakness, memory problems, hx stroke or TIA.   Psych:  Denies symptoms of depression of anxiety.  No hallucination.    Physical Exam:  Blood pressure 118/82, pulse 120, temperature 99.4 F (37.4 C), temperature source Oral, resp. rate 20, SpO2 95 %.  Gen: Large framed AAM in obvious discomfort VS as noted Skin: no rash, cyanosis Neck: no JVD, no bruits or LAN Chest: Grossly clear Heart: regular, no rub or gallop Abdomen: distended with PD fluid from earlier today. Moderate diffuse tenderness ? More in RUQ and  periumbilical. + rebound tenderness. PD catheter exit site clean and dry Ext: No sig LE edema Neuro: alert, Ox3, no focal deficit PD fluid (small amount able to be drained) greenish milky fluid with fibrin strands  Labs:  Recent Labs Lab 09/01/14 1808  NA 138  K 4.2  CL 97  CO2 16*  GLUCOSE 292*  BUN 94*  CREATININE 11.51*  CALCIUM 9.2    Recent Labs Lab 09/01/14 1808  AST 10  ALT 13  ALKPHOS 65  BILITOT 0.4  PROT 7.5  ALBUMIN 3.0*   Recent Labs Lab 09/01/14 1808  WBC 5.8  NEUTROABS 4.2  HGB 6.2*  HCT 19.3*  MCV 95.5  PLT 223   Xrays/Other Studies: Dg Abd Acute W/chest  09/01/2014   CLINICAL DATA:  Elevated blood pressure, lower abdominal pain  EXAM: ACUTE ABDOMEN SERIES (ABDOMEN 2 VIEW & CHEST 1 VIEW)  COMPARISON:  03/15/2014  FINDINGS: Cardiomediastinal silhouette is stable. No acute infiltrate or pleural effusion. No pulmonary edema. There is nonspecific nonobstructive bowel gas pattern. Peritoneal dialysis catheter is noted. Moderate stool and nonspecific air-fluid level noted in right colon.  IMPRESSION: Negative abdominal radiographs.  No acute cardiopulmonary disease.   Electronically Signed   By: Lahoma Crocker M.D.   On: 09/01/2014 20:17    Impression 1. ESRD - on CCPD 2. Non functional PD catheter/no backup access 3. PD associated peritonitis (vs other intra-abd pathology contributing) 4. Fever, chills secondary to #3 5. Severe anemia despite ESA administration with black stool - r/o GI bleed 6. Severe obstipation 7. Metabolic acidosis secondary to no dialysis 8. DM2 9. HTN 10. H/O systolic/diastolic CHF  Recommendations 1. Send the small amount of PD fluid we were able to drain in the ED for cell count, gram stain and culture 2. Blood cultures X 2 if not already done 3. Agree with empiric vanco and zosyn as started in the ED 4. Instill heparin in the catheter tonight - try again in the AM to flush/drain 5. Laxatives - bowel stimulation may move  tip  of catheter around and facilitate drainage 6. Agree with abd CT tonight 7. May require surgical manipulation of PD cath 8. If unable to get PD cath to work in the next 24 hours, will need to place [facility] for hemo as he has no backup access - he does not need urgent dialysis tonight 9. Aranesp 200 mcg/week 10. Check iron studies 11. Agree with transfusion of 1 unit tonight 12. Hemoccult stools   Jamal Maes,  MD [facility] [phone] pager 09/01/2014, 10:35 PM

## 2014-09-02 ENCOUNTER — Encounter (HOSPITAL_COMMUNITY): Payer: Self-pay | Admitting: General Practice

## 2014-09-02 ENCOUNTER — Inpatient Hospital Stay (HOSPITAL_COMMUNITY): Payer: Medicare Other

## 2014-09-02 DIAGNOSIS — Z992 Dependence on renal dialysis: Secondary | ICD-10-CM

## 2014-09-02 DIAGNOSIS — N186 End stage renal disease: Secondary | ICD-10-CM

## 2014-09-02 DIAGNOSIS — A419 Sepsis, unspecified organism: Secondary | ICD-10-CM

## 2014-09-02 DIAGNOSIS — R1084 Generalized abdominal pain: Secondary | ICD-10-CM

## 2014-09-02 DIAGNOSIS — IMO0001 Reserved for inherently not codable concepts without codable children: Secondary | ICD-10-CM | POA: Insufficient documentation

## 2014-09-02 DIAGNOSIS — D62 Acute posthemorrhagic anemia: Secondary | ICD-10-CM | POA: Insufficient documentation

## 2014-09-02 LAB — IRON AND TIBC
Iron: 15 ug/dL — ABNORMAL LOW (ref 42–135)
SATURATION RATIOS: 10 % — AB (ref 20–55)
TIBC: 150 ug/dL — ABNORMAL LOW (ref 215–435)
UIBC: 135 ug/dL (ref 125–400)

## 2014-09-02 LAB — CBC
HCT: 21.9 % — ABNORMAL LOW (ref 39.0–52.0)
HEMATOCRIT: 22.3 % — AB (ref 39.0–52.0)
Hemoglobin: 7.1 g/dL — ABNORMAL LOW (ref 13.0–17.0)
Hemoglobin: 7.1 g/dL — ABNORMAL LOW (ref 13.0–17.0)
MCH: 29.7 pg (ref 26.0–34.0)
MCH: 30.2 pg (ref 26.0–34.0)
MCHC: 31.8 g/dL (ref 30.0–36.0)
MCHC: 32.4 g/dL (ref 30.0–36.0)
MCV: 93.3 fL (ref 78.0–100.0)
MCV: 93.2 fL (ref 78.0–100.0)
Platelets: 201 10*3/uL (ref 150–400)
Platelets: 209 10*3/uL (ref 150–400)
RBC: 2.39 MIL/uL — AB (ref 4.22–5.81)
RBC: 2.35 MIL/uL — ABNORMAL LOW (ref 4.22–5.81)
RDW: 16.1 % — ABNORMAL HIGH (ref 11.5–15.5)
RDW: 16.3 % — AB (ref 11.5–15.5)
WBC: 9.7 10*3/uL (ref 4.0–10.5)
WBC: 10.1 10*3/uL (ref 4.0–10.5)

## 2014-09-02 LAB — URINALYSIS, ROUTINE W REFLEX MICROSCOPIC
Bilirubin Urine: NEGATIVE
GLUCOSE, UA: 500 mg/dL — AB
Ketones, ur: NEGATIVE mg/dL
Leukocytes, UA: NEGATIVE
Nitrite: NEGATIVE
PH: 5.5 (ref 5.0–8.0)
Specific Gravity, Urine: 1.019 (ref 1.005–1.030)
Urobilinogen, UA: 0.2 mg/dL (ref 0.0–1.0)

## 2014-09-02 LAB — GLUCOSE, CAPILLARY
GLUCOSE-CAPILLARY: 217 mg/dL — AB (ref 70–99)
GLUCOSE-CAPILLARY: 145 mg/dL — AB (ref 70–99)
Glucose-Capillary: 188 mg/dL — ABNORMAL HIGH (ref 70–99)
Glucose-Capillary: 154 mg/dL — ABNORMAL HIGH (ref 70–99)
Glucose-Capillary: 139 mg/dL — ABNORMAL HIGH (ref 70–99)

## 2014-09-02 LAB — RENAL FUNCTION PANEL
ALBUMIN: 2.8 g/dL — AB (ref 3.5–5.2)
ANION GAP: 23 — AB (ref 5–15)
BUN: 94 mg/dL — ABNORMAL HIGH (ref 6–23)
CHLORIDE: 98 meq/L (ref 96–112)
CO2: 19 mEq/L (ref 19–32)
CREATININE: 11.28 mg/dL — AB (ref 0.50–1.35)
Calcium: 9.1 mg/dL (ref 8.4–10.5)
GFR calc Af Amer: 5 mL/min — ABNORMAL LOW (ref >90)
GFR calc non Af Amer: 4 mL/min — ABNORMAL LOW (ref >90)
Glucose, Bld: 194 mg/dL — ABNORMAL HIGH (ref 70–99)
Phosphorus: 6 mg/dL — ABNORMAL HIGH (ref 2.3–4.6)
Potassium: 4.2 mEq/L (ref 3.7–5.3)
Sodium: 140 mEq/L (ref 137–147)

## 2014-09-02 LAB — BODY FLUID CELL COUNT WITH DIFFERENTIAL
Eos, Fluid: 0 %
LYMPHS FL: 1 %
MONOCYTE-MACROPHAGE-SEROUS FLUID: 10 % — AB (ref 50–90)
NEUTROPHIL FLUID: 89 % — AB (ref 0–25)
Total Nucleated Cell Count, Fluid: 9750 cu mm — ABNORMAL HIGH (ref 0–1000)

## 2014-09-02 LAB — PREPARE RBC (CROSSMATCH)

## 2014-09-02 LAB — URINE MICROSCOPIC-ADD ON

## 2014-09-02 LAB — CBG MONITORING, ED: GLUCOSE-CAPILLARY: 194 mg/dL — AB (ref 70–99)

## 2014-09-02 LAB — TROPONIN I: Troponin I: <0.3 ng/mL (ref <0.30)

## 2014-09-02 LAB — GRAM STAIN

## 2014-09-02 LAB — MRSA PCR SCREENING: MRSA by PCR: NEGATIVE

## 2014-09-02 LAB — PATHOLOGIST SMEAR REVIEW

## 2014-09-02 LAB — OCCULT BLOOD X 1 CARD TO LAB, STOOL: Fecal Occult Bld: POSITIVE — AB

## 2014-09-02 MED ORDER — ALTEPLASE 100 MG IV SOLR
2.0000 mg | INTRAVENOUS | Status: AC
  Administered 2014-09-02: 2 mg
  Filled 2014-09-02: qty 2

## 2014-09-02 MED ORDER — INSULIN ASPART 100 UNIT/ML ~~LOC~~ SOLN
0.0000 [IU] | SUBCUTANEOUS | Status: DC
  Administered 2014-09-02: 1 [IU] via SUBCUTANEOUS
  Administered 2014-09-03: 2 [IU] via SUBCUTANEOUS
  Administered 2014-09-03 (×2): 1 [IU] via SUBCUTANEOUS
  Administered 2014-09-03: 2 [IU] via SUBCUTANEOUS
  Administered 2014-09-03 – 2014-09-04 (×3): 1 [IU] via SUBCUTANEOUS

## 2014-09-02 MED ORDER — DEXTROSE-NACL 5-0.45 % IV SOLN
INTRAVENOUS | Status: DC
  Administered 2014-09-02 – 2014-09-04 (×3): via INTRAVENOUS

## 2014-09-02 MED ORDER — CETYLPYRIDINIUM CHLORIDE 0.05 % MT LIQD
7.0000 mL | Freq: Two times a day (BID) | OROMUCOSAL | Status: DC
  Administered 2014-09-02 – 2014-09-10 (×13): 7 mL via OROMUCOSAL

## 2014-09-02 MED ORDER — LANTHANUM CARBONATE 500 MG PO CHEW
1000.0000 mg | CHEWABLE_TABLET | Freq: Two times a day (BID) | ORAL | Status: DC | PRN
  Filled 2014-09-02: qty 4

## 2014-09-02 MED ORDER — HYDROMORPHONE HCL 1 MG/ML IJ SOLN
1.0000 mg | Freq: Once | INTRAMUSCULAR | Status: AC
  Administered 2014-09-02: 1 mg via INTRAVENOUS
  Filled 2014-09-02: qty 1

## 2014-09-02 MED ORDER — HYDROMORPHONE HCL 1 MG/ML IJ SOLN
1.0000 mg | INTRAMUSCULAR | Status: DC | PRN
  Administered 2014-09-02: 1 mg via INTRAVENOUS
  Filled 2014-09-02: qty 1

## 2014-09-02 MED ORDER — ALBUTEROL SULFATE (2.5 MG/3ML) 0.083% IN NEBU: 2.5000 mg | INHALATION_SOLUTION | RESPIRATORY_TRACT | Status: DC | PRN

## 2014-09-02 MED ORDER — INSULIN ASPART 100 UNIT/ML ~~LOC~~ SOLN: 0.0000 [IU] | SUBCUTANEOUS | Status: DC

## 2014-09-02 MED ORDER — ONDANSETRON HCL 4 MG/2ML IJ SOLN
4.0000 mg | Freq: Four times a day (QID) | INTRAMUSCULAR | Status: DC | PRN
Start: 2014-09-02 — End: 2014-09-10
  Administered 2014-09-02: 4 mg via INTRAVENOUS
  Filled 2014-09-02: qty 2

## 2014-09-02 MED ORDER — HYDROMORPHONE HCL 1 MG/ML IJ SOLN
0.5000 mg | INTRAMUSCULAR | Status: DC | PRN
Start: 2014-09-02 — End: 2014-09-07
  Administered 2014-09-03 – 2014-09-07 (×24): 0.5 mg via INTRAVENOUS
  Filled 2014-09-02 (×22): qty 1

## 2014-09-02 MED ORDER — ALTEPLASE 100 MG IV SOLR
2.0000 mg | Freq: Once | INTRAVENOUS | Status: AC | PRN
  Filled 2014-09-02 (×2): qty 2

## 2014-09-02 MED ORDER — SODIUM CHLORIDE 0.9 % IV SOLN
Freq: Once | INTRAVENOUS | Status: AC
  Administered 2014-09-02: 14:00:00 via INTRAVENOUS

## 2014-09-02 MED ORDER — HYDROMORPHONE HCL 1 MG/ML IJ SOLN
0.5000 mg | INTRAMUSCULAR | Status: DC | PRN
  Administered 2014-09-02 (×3): 0.5 mg via INTRAVENOUS
  Filled 2014-09-02 (×3): qty 1

## 2014-09-02 MED ORDER — CALCITRIOL 0.5 MCG PO CAPS: 0.5000 ug | ORAL_CAPSULE | ORAL | Status: DC

## 2014-09-02 NOTE — Consult Note (Signed)
[facility] Reason for Consult: Peritonitis Referring Physician: Dr. Clarise Cruz Neal/Family Medicine Service  Jonathon Bailey is an 58 y.o. male.  HPI: 58 y/o male admitted 09/01/2014 with diffuse abdominal pain suspected to be the result of peritonitis from peritoneal dialysis catheter. Patient attempted to perform PD exchange on Sunday 08/29/2014 and reports that he developed constipation and severe abdominal pain during the exchange. Tried laxatives and manual disimpaction with some relief, however, stool was consistent with melena. Attempted to perform PD exchange on 08/30/2014 but developed such severe abdominal pain, he quickly drained out 2500cc of 3000cc of fluid. Home training unit went to see patient and found PD catheter to be dysfunctional (2L in with no return). Heparin instilled and still no return.  Presented to ER febrile with rigors and tachycardia, but hemodynamically stable. In the ED, attempted to drain patient - greenish milky fluid was obtained but only enough for culture, cell count. Full return of the earlier 2 liter exchange not accomplished. KUB showed catheter in the right pelvis - did not look malpositioned.  CT A/P consistent with small amount of free fluid in the upper abdomen and pelvis with minimal free air demonstrated, likely related to pelvic peritoneal dialysis catheter. Small periumbilical hernia containing fat and small bowel. Contrast filled bowel demonstrate upper limits of normal caliber. Distal bowel are decompressed. Early or partial small bowel obstruction not excluded.  PD fluid culture pending Cell count with WBCs =9750 cu/mm Gram stain: WBCs but no organisms Blood cultures pend. We have been asked to see patient for consideration of removal of his PD catheter and possible early or partial SBO.   Past Medical History  Diagnosis Date  . Hyperlipidemia   . Hypertension   . Arthritis   . Anxiety   . Depression   . Chronic systolic heart failure  08/67/6195    ECHO Feb 2013 showed LVEF low normal at 50-55%, +hypokinetic anterolateral wall and inferolateral wall.    . Obesity   . Macular edema   . Diabetic retinopathy   . Myocardial infarction     status post MI x2 and 3 stents placed in 2003  . Asthma     "as a child"  . Sleep apnea     "sleep w/CPAP sometimes" (03/15/2014)  . KDTOIZTI(458.0)     "maybe monthly" (03/15/2014)  . Stroke ~ 2007; ~1987    "weak on right side; messed w/right side of brain; cry all the time"  . CKD (chronic kidney disease) stage 4, GFR 15-29 ml/min 08/30/2009    Progressive renal failure since 2008, creatinine 1.2 in 2008 up to 3.5 in 2012 and 3.2-5.0 in 2013. All UA's 2011-13 showed >300 protein on dipstick. Work-up in May 2011 showed negative Urine IFE and SPEP, ultrasound showed 12-13 cm kidneys with increased echogenicity and UPC ratio was 1.5 gm proteinuria.  Hgb A1C's from 2011 to 2013 were all between 9-11.  Patient saw Dr. Donnetta Hutching (vasc surgery) for HD access in Aug 2013 > vein mapping was done and Dr. Donnetta Hutching felt the left arm (pt is R handed) was suitable for L arm Cimino radiocephalic fistula. Patient said he wasn't ready to consider doing dialysis and declined the surgery.     Marland Kitchen ESRD (end stage renal disease) on dialysis   . Type II diabetes mellitus     insulin dependent    Past Surgical History  Procedure Laterality Date  . Umbilical hernia repair    . Av fistula placement  10/17/2012  Procedure: ARTERIOVENOUS (AV) FISTULA CREATION;  Surgeon: Mal Misty, MD;  Location: [facility];  Service: Vascular;  Laterality: Left;  . Peritoneal catheter insertion  03/15/2014  . Hernia repair    . Incisional hernia repair  03/15/2014  . Laparoscopic lysis of adhesions  03/15/2014  . Coronary angioplasty with stent placement  ~ 2002    "3"  . Refractive surgery Bilateral   . Capd insertion N/A 03/15/2014    Procedure: LAPAROSCOPIC INSERTION CONTINUOUS AMBULATORY PERITONEAL DIALYSIS CATHETER, LAPARASCOPIC  INCISIONAL HERNIA  REPAIR  WITH MESH, OMENTOPEXY AND LYSIS OF ADHESIONS;  Surgeon: [name], MD;  Location: [place];  Service: General;  Laterality: N/A;  . Shuntogram N/A 03/11/2013    Procedure: Fistulogram;  Surgeon: Conrad [place], MD;  Location: [facility] CATH LAB;  Service: Cardiovascular;  Laterality: N/A;    Family History  Problem Relation Age of Onset  . Asthma Mother   . Hyperlipidemia Mother   . Hypertension Mother   . Stroke Father   . Heart attack Father   . Prostate cancer Father   . Deep vein thrombosis Father   . Cancer Father   . Diabetes Father   . Hyperlipidemia Father   . Hypertension Father   . Other Father     varicose veins  . Heart disease Father     before age 27  . Other Sister     varicose veins    Social History:  reports that he quit smoking about 27 years ago. His smoking use included Cigarettes. He has a 15 pack-year smoking history. He has never used smokeless tobacco. He reports that he drinks alcohol. He reports that he does not use illicit drugs.  Allergies:  Allergies  Allergen Reactions  . Feraheme [Ferumoxytol] Itching    Severe itching.    Medications: I have reviewed the patient's current medications.  Results for orders placed or performed during the hospital encounter of 09/01/14 (from the past 48 hour(s))  Blood Culture (routine x 2)     Status: None (Preliminary result)   Collection Time: 09/01/14  6:08 PM  Result Value Ref Range   Specimen Description BLOOD HAND RIGHT    Special Requests BOTTLES DRAWN AEROBIC ONLY 2CC    Culture  Setup Time      09/01/2014 22:45 Performed at [facility] TO DATE CULTURE WILL BE HELD FOR 5 DAYS BEFORE ISSUING A FINAL NEGATIVE REPORT Performed at [facility]    Report Status PENDING   CBC WITH DIFFERENTIAL     Status: Abnormal   Collection Time: 09/01/14  6:08 PM  Result Value Ref Range   WBC 5.8 4.0 - 10.5 K/uL   RBC  2.02 (L) 4.22 - 5.81 MIL/uL   Hemoglobin 6.2 (LL) 13.0 - 17.0 g/dL    Comment: REPEATED TO VERIFY CRITICAL RESULT CALLED TO, READ BACK BY AND VERIFIED WITH: MILLER,B _0  12.9.15 BY GRINSTEAD,C    HCT 19.3 (L) 39.0 - 52.0 %   MCV 95.5 78.0 - 100.0 fL   MCH 30.7 26.0 - 34.0 pg   MCHC 32.1 30.0 - 36.0 g/dL   RDW 16.7 (H) 11.5 - 15.5 %   Platelets 223 150 - 400 K/uL   Neutrophils Relative % 71 43 - 77 %   Neutro Abs 4.2 1.7 - 7.7 K/uL   Lymphocytes Relative 14 12 - 46 %  Lymphs Abs 0.8 0.7 - 4.0 K/uL   Monocytes Relative 13 (H) 3 - 12 %   Monocytes Absolute 0.8 0.1 - 1.0 K/uL   Eosinophils Relative 1 0 - 5 %   Eosinophils Absolute 0.1 0.0 - 0.7 K/uL   Basophils Relative 0 0 - 1 %   Basophils Absolute 0.0 0.0 - 0.1 K/uL  Comprehensive metabolic panel     Status: Abnormal   Collection Time: 09/01/14  6:08 PM  Result Value Ref Range   Sodium 138 137 - 147 mEq/L   Potassium 4.2 3.7 - 5.3 mEq/L   Chloride 97 96 - 112 mEq/L   CO2 16 (L) 19 - 32 mEq/L   Glucose, Bld 292 (H) 70 - 99 mg/dL   BUN 94 (H) 6 - 23 mg/dL   Creatinine, Ser 11.51 (H) 0.50 - 1.35 mg/dL   Calcium 9.2 8.4 - 10.5 mg/dL   Total Protein 7.5 6.0 - 8.3 g/dL   Albumin 3.0 (L) 3.5 - 5.2 g/dL   AST 10 0 - 37 U/L   ALT 13 0 - 53 U/L   Alkaline Phosphatase 65 39 - 117 U/L   Total Bilirubin 0.4 0.3 - 1.2 mg/dL   GFR calc non Af Amer 4 (L) >90 mL/min   GFR calc Af Amer 5 (L) >90 mL/min    Comment: (NOTE) The eGFR has been calculated using the CKD EPI equation. This calculation has not been validated in all clinical situations. eGFR's persistently <90 mL/min signify possible Chronic Kidney Disease.    Anion gap 25 (H) 5 - 15  Blood Culture (routine x 2)     Status: None (Preliminary result)   Collection Time: 09/01/14  6:13 PM  Result Value Ref Range   Specimen Description BLOOD HAND LEFT    Special Requests BOTTLES DRAWN AEROBIC AND ANAEROBIC 5CC    Culture  Setup Time      09/01/2014 22:47 Performed at [facility] NO GROWTH TO DATE CULTURE WILL BE HELD FOR 5 DAYS BEFORE ISSUING A FINAL NEGATIVE REPORT Performed at [facility]    Report Status PENDING   I-Stat CG4 Lactic Acid, ED     Status: None   Collection Time: 09/01/14  6:50 PM  Result Value Ref Range   Lactic Acid, Venous 1.82 0.5 - 2.2 mmol/L  I-Stat venous blood gas, ED (order at [facility] and [facility] only)     Status: Abnormal   Collection Time: 09/01/14  6:51 PM  Result Value Ref Range   pH, Ven 7.316 (H) 7.250 - 7.300   pCO2, Ven 40.8 (L) 45.0 - 50.0 mmHg   pO2, Ven 28.0 (LL) 30.0 - 45.0 mmHg   Bicarbonate 20.8 20.0 - 24.0 mEq/L   TCO2 22 0 - 100 mmol/L   O2 Saturation 47.0 %   Acid-base deficit 5.0 (H) 0.0 - 2.0 mmol/L   Sample type VENOUS   Type and screen     Status: None (Preliminary result)   Collection Time: 09/01/14  8:27 PM  Result Value Ref Range   ABO/RH(D) O POS    Antibody Screen NEG    Sample Expiration 09/04/2014    Unit Number [id number]    Blood Component Type RED CELLS,LR    Unit division 00    Status of Unit ALLOCATED    Transfusion Status OK TO TRANSFUSE    Crossmatch Result  Compatible    Unit Number [id number]    Blood Component Type RED CELLS,LR    Unit division 00    Status of Unit ISSUED    Transfusion Status OK TO TRANSFUSE    Crossmatch Result Compatible   Body fluid cell count with differential     Status: Abnormal   Collection Time: 09/01/14  9:35 PM  Result Value Ref Range   Fluid Type-FCT PERITONEAL     Comment: FLUID ABDOMEN CORRECTED ON 12/09 AT 2328: PREVIOUSLY REPORTED AS PERITONEAL DIALYSATE, CORRECTED ON 12/09 AT 2323: PREVIOUSLY REPORTED AS Body Fluid    Color, Fluid YELLOW (A) YELLOW   Appearance, Fluid TURBID (A) CLEAR   WBC, Fluid 9750 (H) 0 - 1000 cu mm   Neutrophil Count, Fluid 89 (H) 0 - 25 %   Lymphs, Fluid 1 %   Monocyte-Macrophage-Serous Fluid 10 (L) 50 - 90 %   Eos, Fluid 0 %   Other Cells, Fluid  %     Intracellular microorganisms present, correlate with micro.    Comment: Called to Howie Ill 9323 09/02/14 D Bradley  Body fluid culture     Status: None (Preliminary result)   Collection Time: 09/01/14  9:35 PM  Result Value Ref Range   Specimen Description PERITONEAL FLUID ABDOMEN    Special Requests Normal    Gram Stain      ABUNDANT WBC PRESENT,BOTH PMN AND MONONUCLEAR NO ORGANISMS SEEN Gram Stain Report Called to,Read Back By and Verified With: Gram Stain Report Called to,Read Back By and Verified With: A LUIS RN 09/02/14 0111AM BY [email or web address] Performed at [facility] Performed at [facility]    Culture PENDING    Report Status PENDING   Gram stain     Status: None   Collection Time: 09/01/14  9:35 PM  Result Value Ref Range   Specimen Description PERITONEAL FLUID ABDOMEN    Special Requests NONE    Gram Stain      ABUNDANT WBC PRESENT,BOTH PMN AND MONONUCLEAR NO ORGANISMS SEEN Results Called to: Thalia Party [id number] [address]    Report Status 09/02/2014 FINAL   I-Stat CG4 Lactic Acid, ED     Status: None   Collection Time: 09/01/14  9:57 PM  Result Value Ref Range   Lactic Acid, Venous 1.06 0.5 - 2.2 mmol/L  Prepare RBC     Status: None   Collection Time: 09/01/14 11:23 PM  Result Value Ref Range   Order Confirmation ORDER PROCESSED BY BLOOD BANK   Urinalysis, Routine w reflex microscopic     Status: Abnormal   Collection Time: 09/02/14  1:59 AM  Result Value Ref Range   Color, Urine YELLOW YELLOW   APPearance CLEAR CLEAR   Specific Gravity, Urine 1.019 1.005 - 1.030   pH 5.5 5.0 - 8.0   Glucose, UA 500 (A) NEGATIVE mg/dL   Hgb urine dipstick MODERATE (A) NEGATIVE   Bilirubin Urine NEGATIVE NEGATIVE   Ketones, ur NEGATIVE NEGATIVE mg/dL   Protein, ur >300 (A) NEGATIVE mg/dL   Urobilinogen, UA 0.2 0.0 - 1.0 mg/dL   Nitrite NEGATIVE NEGATIVE   Leukocytes, UA NEGATIVE NEGATIVE  Urine microscopic-add on     Status: Abnormal   Collection Time:  09/02/14  1:59 AM  Result Value Ref Range   Squamous Epithelial / LPF RARE RARE   WBC, UA 3-6 <3 WBC/hpf   RBC / HPF 3-6 <3 RBC/hpf   Bacteria, UA MANY (A) RARE   Urine-Other AMORPHOUS URATES/PHOSPHATES  CBG monitoring, ED     Status: Abnormal   Collection Time: 09/02/14  2:05 AM  Result Value Ref Range   Glucose-Capillary 194 (H) 70 - 99 mg/dL   Comment 1 Notify RN    Comment 2 Documented in Chart   CBC     Status: Abnormal   Collection Time: 09/02/14  2:24 AM  Result Value Ref Range   WBC 9.7 4.0 - 10.5 K/uL   RBC 2.39 (L) 4.22 - 5.81 MIL/uL   Hemoglobin 7.1 (L) 13.0 - 17.0 g/dL   HCT 22.3 (L) 39.0 - 52.0 %   MCV 93.3 78.0 - 100.0 fL   MCH 29.7 26.0 - 34.0 pg   MCHC 31.8 30.0 - 36.0 g/dL   RDW 16.1 (H) 11.5 - 15.5 %   Platelets 201 150 - 400 K/uL  Iron and TIBC     Status: Abnormal   Collection Time: 09/02/14  2:24 AM  Result Value Ref Range   Iron 15 (L) 42 - 135 ug/dL   TIBC 150 (L) 215 - 435 ug/dL   Saturation Ratios 10 (L) 20 - 55 %   UIBC 135 125 - 400 ug/dL    Comment: Performed at [facility]  Renal function panel     Status: Abnormal   Collection Time: 09/02/14  2:24 AM  Result Value Ref Range   Sodium 140 137 - 147 mEq/L   Potassium 4.2 3.7 - 5.3 mEq/L   Chloride 98 96 - 112 mEq/L   CO2 19 19 - 32 mEq/L   Glucose, Bld 194 (H) 70 - 99 mg/dL   BUN 94 (H) 6 - 23 mg/dL   Creatinine, Ser 11.28 (H) 0.50 - 1.35 mg/dL   Calcium 9.1 8.4 - 10.5 mg/dL   Phosphorus 6.0 (H) 2.3 - 4.6 mg/dL   Albumin 2.8 (L) 3.5 - 5.2 g/dL   GFR calc non Af Amer 4 (L) >90 mL/min   GFR calc Af Amer 5 (L) >90 mL/min    Comment: (NOTE) The eGFR has been calculated using the CKD EPI equation. This calculation has not been validated in all clinical situations. eGFR's persistently <90 mL/min signify possible Chronic Kidney Disease.    Anion gap 23 (H) 5 - 15  Occult blood card to lab, stool RN will collect     Status: Abnormal   Collection Time: 09/02/14  4:15 AM  Result  Value Ref Range   Fecal Occult Bld POSITIVE (A) NEGATIVE  CBC     Status: Abnormal   Collection Time: 09/02/14  5:03 AM  Result Value Ref Range   WBC 10.1 4.0 - 10.5 K/uL   RBC 2.35 (L) 4.22 - 5.81 MIL/uL   Hemoglobin 7.1 (L) 13.0 - 17.0 g/dL   HCT 21.9 (L) 39.0 - 52.0 %   MCV 93.2 78.0 - 100.0 fL   MCH 30.2 26.0 - 34.0 pg   MCHC 32.4 30.0 - 36.0 g/dL   RDW 16.3 (H) 11.5 - 15.5 %   Platelets 209 150 - 400 K/uL  Glucose, capillary     Status: Abnormal   Collection Time: 09/02/14  5:52 AM  Result Value Ref Range   Glucose-Capillary 217 (H) 70 - 99 mg/dL   Comment 1 Documented in Chart    Comment 2 Notify RN   MRSA PCR Screening     Status: None   Collection Time: 09/02/14  5:58 AM  Result Value Ref Range   MRSA by PCR NEGATIVE NEGATIVE  Comment:        The GeneXpert MRSA Assay (FDA approved for NASAL specimens only), is one component of a comprehensive MRSA colonization surveillance program. It is not intended to diagnose MRSA infection nor to guide or monitor treatment for MRSA infections.   Glucose, capillary     Status: Abnormal   Collection Time: 09/02/14  8:35 AM  Result Value Ref Range   Glucose-Capillary 188 (H) 70 - 99 mg/dL    Ct Abdomen Pelvis Wo Contrast  09/02/2014   CLINICAL DATA:  Abdominal pain and distention. Stent from dialysis for fever and elevated blood pressure.  EXAM: CT ABDOMEN AND PELVIS WITHOUT CONTRAST  TECHNIQUE: Multidetector CT imaging of the abdomen and pelvis was performed following the standard protocol without IV contrast.  COMPARISON:  None.  FINDINGS: Atelectasis in the lung bases.  Coronary artery calcifications.  Small amount of free fluid in the upper abdomen, extending along the pericolic gutters to the pelvis. Small amount of free air demonstrated in the upper abdomen with tiny gas bubble noted. A pelvic peritoneal dialysis catheter is noted to be in place, likely accounting for these changes. The unenhanced appearance of the liver,  spleen, gallbladder, pancreas, adrenal glands, kidneys, abdominal aorta, inferior vena cava, and retroperitoneal lymph nodes is unremarkable. Stomach is decompressed. Stool-filled colon without distention. Prominent visceral adipose tissues.  Pelvis: Bladder wall is mildly thickened. Bladder infection not excluded. Prostate gland is not enlarged. No pelvic mass or lymphadenopathy. Appendix is not identified and may be surgically absent. Small periumbilical hernia containing fat and small bowel. Proximal small bowel are contrast filled with upper limits of normal caliber, measuring about 2.9 cm diameter. Distal small bowel loops are decompressed. Early or partial obstruction not entirely excluded. Degenerative changes in the lumbar spine. No destructive bone lesions appreciated.  IMPRESSION: Small amount of free fluid in the upper abdomen and pelvis with minimal free air demonstrated, likely related to pelvic peritoneal dialysis catheter. Small periumbilical hernia containing fat and small bowel. Contrast filled bowel demonstrate upper limits of normal caliber. Distal bowel are decompressed. Early or partial small bowel obstruction not excluded.   Electronically Signed   By: Lucienne Capers M.D.   On: 09/02/2014 02:01   Dg Abd Acute W/chest  09/01/2014   CLINICAL DATA:  Elevated blood pressure, lower abdominal pain  EXAM: ACUTE ABDOMEN SERIES (ABDOMEN 2 VIEW & CHEST 1 VIEW)  COMPARISON:  03/15/2014  FINDINGS: Cardiomediastinal silhouette is stable. No acute infiltrate or pleural effusion. No pulmonary edema. There is nonspecific nonobstructive bowel gas pattern. Peritoneal dialysis catheter is noted. Moderate stool and nonspecific air-fluid level noted in right colon.  IMPRESSION: Negative abdominal radiographs.  No acute cardiopulmonary disease.   Electronically Signed   By: Lahoma Crocker M.D.   On: 09/01/2014 20:17    Review of Systems  Constitutional: Positive for fever and chills.  HENT: Negative.   Eyes:  Negative.   Respiratory: Positive for cough.   Cardiovascular: Negative.   Gastrointestinal: Positive for nausea, vomiting, abdominal pain, constipation and melena.  Genitourinary:       +ESRD  Skin: Negative.    Blood pressure 116/65, pulse 98, temperature 99.3 F (37.4 C), temperature source Oral, resp. rate 14, weight 131.4 kg (289 lb 11 oz), SpO2 93 %. Physical Exam  Nursing note and vitals reviewed. Constitutional: He is oriented to person, place, and time. He appears well-developed and well-nourished. No distress.  +morbidly obese  HENT:  Head: Normocephalic and atraumatic.  Eyes: Conjunctivae are  normal. No scleral icterus.  Cardiovascular: Normal rate, regular rhythm and normal heart sounds.   Respiratory: Effort normal and breath sounds normal. No respiratory distress. He has no wheezes.  GI: He exhibits distension. Bowel sounds are decreased. There is tenderness. There is rebound. There is no rigidity.  Abdomen is obese, soft but diffusely and moderately tender. +mild guarding  Musculoskeletal: Normal range of motion.  Neurological: He is alert and oriented to person, place, and time.  Skin: Skin is warm and dry. No rash noted. No erythema.  PD catheter site at left abdomen clean.    Assessment: 1. ESRD - on CCPD 2. Non functional PD catheter/no backup access 3. PD associated peritonitis (vs other intra-abd pathology contributing) 4. Fever, chills secondary to #3 5. Severe anemia despite ESA administration with black stool - r/o GI bleed 6. Severe obstipation 7. Metabolic acidosis secondary to no dialysis 8. DM2 9. HTN 10. H/O systolic/diastolic CHF 11. Ileus vs early/partial SBO  Plan: 1.ESRD: Needs temporary access for dialysis as it is likely that dysfunctional PD catheter will need to be removed. Attempts to flush catheter at 10:45am today still unsuccessful. Earliest available OR time will likely be 09/03/2014 unless emergent need. 2. Peritonitis: on  vancomycin and zosyn. Cultures pending.  3. Anemia: ?GIB? Source of melena?  4. Ileus vs pSBO: now with nausea and bilious emesis. Will order NGT to [facility] and advise NPO/bowel rest with IV hydration per medicine and renal service.  Will discuss above with Dr. Hulen Skains this afternoon.    Lahoma Rocker, [facility] Pager [phone] 09/02/2014, 12:10 PM

## 2014-09-02 NOTE — Progress Notes (Signed)
Family Medicine Teaching Service Daily Progress Note Intern Pager: [phone]  Patient name: Jonathon Bailey Medical record number: [id number] Date of birth: [birth year] Age: 58 y.o. Gender: male  Primary Care Provider: Thersa Salt, DO Consultants: Nephrology, Surgery Code Status: FULL (discussed on admission)  Pt Overview and Major Events to Date:  09/01/14: Patient with FOBT+  Assessment and Plan: Jonathon Bailey is a 58 y.o. male presenting with generalized abdominal pain . PMH is significant for ESRD with peritoneal dialysis, T2DM, CAD, HTN, Chronic systolic and diastolic HF, h/o CVA, COPD  Abdominal pain: Peritoneal fluid cloudy and fever to 102F very suspicious for peritonitis. Abdominal xray with moderate stool burden but no acute findings. LA: 1.82>1.06. hgb 6.2 (baseline 7-8). Pt meets sepsis criteria with source of infection, tachycardia, tachypnea, and fever.  CT abdomen pelvis with small amount of free air ?from peritoneal HD site?  Cytology with leukocytosis and intracellular organisms  -c/s to surgery this am -c/s to renal: Seen by Dr Lorrene Reid in ED.  -peritoneal fluid studies & blood cx pending -Vanc and Zosyn -lactulose to encourage movement of stool -monitor vitals closely in ICU (step down bed not available) -keep on telemetry -Dilaudid 1mg  q3 PRN pain  Anemia: Baseline Hgb 7-8, currently with Hgb of 6.2. FOBT+, transfused 1 unit pRBCs 09/02/14. Post transfusion hgb 7.1. -Will transfuse 1 more unit pRBCs with HD -consider GI consult -hold home plavix for now, restart when able  ESRD: Receives daily peritoneal HD. Sees Dr Elroy Channel. Missed Sun and Tues sessions. Cr: 11.51 (baseline 10-11). Slightly acidotic on VBG pH 7.316. S/p 1 amp bicarb -Continue home fosrenol  -holding garamycin   Tachycardia: HR 120's in ED. BP stable. EKG: RBBB, LPFB but no signs of ischemia. HR likely d/t sepsis. -management as above -Continue Coreg -Troponin today  Chronic  systolic and diastolic HF: ECHO 123456: EF 40-45%. -Continue BB -Will limit IVF as able  HTN: BP stable overnight.  AM BP 173/97.   -RN to recheck and page [place] -Continue Coreg, hold home hydralazine at this time since BP's soft and pt septic  CAD & H/o CVA.: Patient without signs of ischemia on exam. Neurologically in tact.  -Continue home Lipitor, Coreg -hold plavix at this time due to concern for GI bleed  T2DM: Last A1c: 7.4. On home levemir 75 u BID -Continue Levemir at decreased dose with SSI  COPD: -Continue Albuterol PRN & Spiriva daily  FEN/GI: SLIV, Renal diet PPx: SCDs (no heparin d/t GI bleed)  Disposition: Dispo pending surgery and renal recs  Subjective:  Patient reports that abdominal pain slightly improved with Dilaudid.  He reports that he is passing stool.  Denies any other concerns at this time other than being too warm.  Objective: Temp:  [99.2 F (37.3 C)-102.6 F (39.2 C)] 100 F (37.8 C) (12/10 0554) Pulse Rate:  [105-126] 126 (12/10 0800) Resp:  [16-32] 27 (12/10 0800) BP: (116-173)/(56-98) 173/97 mmHg (12/10 0800) SpO2:  [89 %-100 %] 94 % (12/10 0800) FiO2 (%):  [2 %] 2 % (12/10 0153) Weight:  [289 lb 11 oz (131.4 kg)] 289 lb 11 oz (131.4 kg) (12/10 0600) Physical Exam: General: awake, alert, lying in bed, uncomfortable but no acute distress Cardiovascular: tachycardic but regular rhythm, brisk cap refill Respiratory: CTAB, 2L O2 [place] in place, no increased WOB Abdomen: obese, distended, TTP predominantly in the RU and LUQ, bowel sounds very difficult to ausculate Extremities: WWP, no edema, clubbing or cyanosis.  Laboratory:  Recent Labs Lab 09/01/14 1808  09/02/14 0224 09/02/14 0503  WBC 5.8 9.7 10.1  HGB 6.2* 7.1* 7.1*  HCT 19.3* 22.3* 21.9*  PLT 223 201 209    Recent Labs Lab 09/01/14 1808 09/02/14 0224  NA 138 140  K 4.2 4.2  CL 97 98  CO2 16* 19  BUN 94* 94*  CREATININE 11.51* 11.28*  CALCIUM 9.2 9.1  PROT 7.5  --    BILITOT 0.4  --   ALKPHOS 65  --   ALT 13  --   AST 10  --   GLUCOSE 292* 194*    Imaging/Diagnostic Tests:  Ct Abdomen Pelvis Wo Contrast  09/02/2014   CLINICAL DATA:  Abdominal pain and distention. Stent from dialysis for fever and elevated blood pressure.  EXAM: CT ABDOMEN AND PELVIS WITHOUT CONTRAST  TECHNIQUE: Multidetector CT imaging of the abdomen and pelvis was performed following the standard protocol without IV contrast.  COMPARISON:  None.  FINDINGS: Atelectasis in the lung bases.  Coronary artery calcifications.  Small amount of free fluid in the upper abdomen, extending along the pericolic gutters to the pelvis. Small amount of free air demonstrated in the upper abdomen with tiny gas bubble noted. A pelvic peritoneal dialysis catheter is noted to be in place, likely accounting for these changes. The unenhanced appearance of the liver, spleen, gallbladder, pancreas, adrenal glands, kidneys, abdominal aorta, inferior vena cava, and retroperitoneal lymph nodes is unremarkable. Stomach is decompressed. Stool-filled colon without distention. Prominent visceral adipose tissues.  Pelvis: Bladder wall is mildly thickened. Bladder infection not excluded. Prostate gland is not enlarged. No pelvic mass or lymphadenopathy. Appendix is not identified and may be surgically absent. Small periumbilical hernia containing fat and small bowel. Proximal small bowel are contrast filled with upper limits of normal caliber, measuring about 2.9 cm diameter. Distal small bowel loops are decompressed. Early or partial obstruction not entirely excluded. Degenerative changes in the lumbar spine. No destructive bone lesions appreciated.  IMPRESSION: Small amount of free fluid in the upper abdomen and pelvis with minimal free air demonstrated, likely related to pelvic peritoneal dialysis catheter. Small periumbilical hernia containing fat and small bowel. Contrast filled bowel demonstrate upper limits of normal caliber.  Distal bowel are decompressed. Early or partial small bowel obstruction not excluded.   Electronically Signed   By: Lucienne Capers M.D.   On: 09/02/2014 02:01   Dg Abd Acute W/chest  09/01/2014   CLINICAL DATA:  Elevated blood pressure, lower abdominal pain  EXAM: ACUTE ABDOMEN SERIES (ABDOMEN 2 VIEW & CHEST 1 VIEW)  COMPARISON:  03/15/2014  FINDINGS: Cardiomediastinal silhouette is stable. No acute infiltrate or pleural effusion. No pulmonary edema. There is nonspecific nonobstructive bowel gas pattern. Peritoneal dialysis catheter is noted. Moderate stool and nonspecific air-fluid level noted in right colon.  IMPRESSION: Negative abdominal radiographs.  No acute cardiopulmonary disease.   Electronically Signed   By: Lahoma Crocker M.D.   On: 09/01/2014 20:17    Janora Norlander, DO 09/02/2014, 8:30 AM PGY-1, [place] Intern pager: [phone], text pages welcome

## 2014-09-02 NOTE — Progress Notes (Signed)
UR Completed.  Jonathon Bailey Jane [phone]  

## 2014-09-02 NOTE — Progress Notes (Signed)
Inpatient Diabetes Program Recommendations  AACE/ADA: New Consensus Statement on Inpatient Glycemic Control (2013)  Target Ranges:  Prepandial:   less than 140 mg/dL      Peak postprandial:   less than 180 mg/dL (1-2 hours)      Critically ill patients:  140 - 180 mg/dL   Results for JORJE, CAPE (MRN [id number]) as of 09/02/2014 08:55  Ref. Range 09/02/2014 02:05 09/02/2014 05:52 09/02/2014 08:35  Glucose-Capillary Latest Range: 70-99 mg/dL 194 (H) 217 (H) 188 (H)    Reason for Assessment- elevated CBG  Diabetes history: Type 2 Outpatient Diabetes medications: Levemir 35 units bid Current orders for Inpatient glycemic control: Levemir 35 units bid, Novolog  Moderate correction with meals  Current CBG improving since admission- patient may require the addition of mealtime Novolog insulin. Will continue to monitor.   Gentry Fitz, RN, BA, MHA, CDE Diabetes Coordinator Inpatient Diabetes Program  [phone] (Team Pager) [phone] [facility]) 09/02/2014 8:57 AM

## 2014-09-02 NOTE — Progress Notes (Signed)
Report completed to RN Pat on unit 2Heart, pt will transfer to stepdown bed room 2H22. Pt and family (dtr and son) informed of room change.  PD RN currently in room with pt attempting to drain PD cath again. Will transfer after she is done.

## 2014-09-02 NOTE — Progress Notes (Signed)
Impression 1. ESRD - on CCPD 2. Outflow obstruction of PD catheter/no backup access 3. PD associated peritonitis (vs other intra-abd pathology contributing) 4. Severe anemia despite ESA administration with black stool - r/o GI bleed 5. Severe obstipation (antihistamines and binders) 6. Metabolic acidosis secondary to no dialysis  Plan: See if PD cath drains and proceed.  If no drainage may need removal   Subjective: Interval History: Catheter NF, some BMs  Objective: Vital signs in last 24 hours: Temp:  [99.2 F (37.3 C)-102.6 F (39.2 C)] 99.3 F (37.4 C) (12/10 0838) Pulse Rate:  [105-126] 107 (12/10 0900) Resp:  [16-32] 18 (12/10 0900) BP: (116-173)/(56-98) 143/75 mmHg (12/10 0900) SpO2:  [89 %-100 %] 96 % (12/10 0900) FiO2 (%):  [2 %] 2 % (12/10 0153) Weight:  [131.4 kg (289 lb 11 oz)] 131.4 kg (289 lb 11 oz) (12/10 0600) Weight change:   Intake/Output from previous day: 12/09 0701 - 12/10 0700 In: 1070 [I.V.:400; Blood:335] Out: 200 [Urine:200] Intake/Output this shift: Total I/O In: 80 [P.O.:80] Out: -   General appearance: alert, cooperative and obese Chest wall: no tenderness GI: protuberant and diffusely tender, no exit site drainage Ext 1+ edema bilat NF  Lab Results:  Recent Labs  09/02/14 0224 09/02/14 0503  WBC 9.7 10.1  HGB 7.1* 7.1*  HCT 22.3* 21.9*  PLT 201 209   BMET:  Recent Labs  09/01/14 1808 09/02/14 0224  NA 138 140  K 4.2 4.2  CL 97 98  CO2 16* 19  GLUCOSE 292* 194*  BUN 94* 94*  CREATININE 11.51* 11.28*  CALCIUM 9.2 9.1   No results for input(s): PTH in the last 72 hours. Iron Studies: No results for input(s): IRON, TIBC, TRANSFERRIN, FERRITIN in the last 72 hours. Studies/Results: Ct Abdomen Pelvis Wo Contrast  09/02/2014   CLINICAL DATA:  Abdominal pain and distention. Stent from dialysis for fever and elevated blood pressure.  EXAM: CT ABDOMEN AND PELVIS WITHOUT CONTRAST  TECHNIQUE: Multidetector CT imaging of the  abdomen and pelvis was performed following the standard protocol without IV contrast.  COMPARISON:  None.  FINDINGS: Atelectasis in the lung bases.  Coronary artery calcifications.  Small amount of free fluid in the upper abdomen, extending along the pericolic gutters to the pelvis. Small amount of free air demonstrated in the upper abdomen with tiny gas bubble noted. A pelvic peritoneal dialysis catheter is noted to be in place, likely accounting for these changes. The unenhanced appearance of the liver, spleen, gallbladder, pancreas, adrenal glands, kidneys, abdominal aorta, inferior vena cava, and retroperitoneal lymph nodes is unremarkable. Stomach is decompressed. Stool-filled colon without distention. Prominent visceral adipose tissues.  Pelvis: Bladder wall is mildly thickened. Bladder infection not excluded. Prostate gland is not enlarged. No pelvic mass or lymphadenopathy. Appendix is not identified and may be surgically absent. Small periumbilical hernia containing fat and small bowel. Proximal small bowel are contrast filled with upper limits of normal caliber, measuring about 2.9 cm diameter. Distal small bowel loops are decompressed. Early or partial obstruction not entirely excluded. Degenerative changes in the lumbar spine. No destructive bone lesions appreciated.  IMPRESSION: Small amount of free fluid in the upper abdomen and pelvis with minimal free air demonstrated, likely related to pelvic peritoneal dialysis catheter. Small periumbilical hernia containing fat and small bowel. Contrast filled bowel demonstrate upper limits of normal caliber. Distal bowel are decompressed. Early or partial small bowel obstruction not excluded.   Electronically Signed   By: Oren Beckmann.D.  On: 09/02/2014 02:01   Dg Abd Acute W/chest  09/01/2014   CLINICAL DATA:  Elevated blood pressure, lower abdominal pain  EXAM: ACUTE ABDOMEN SERIES (ABDOMEN 2 VIEW & CHEST 1 VIEW)  COMPARISON:  03/15/2014  FINDINGS:  Cardiomediastinal silhouette is stable. No acute infiltrate or pleural effusion. No pulmonary edema. There is nonspecific nonobstructive bowel gas pattern. Peritoneal dialysis catheter is noted. Moderate stool and nonspecific air-fluid level noted in right colon.  IMPRESSION: Negative abdominal radiographs.  No acute cardiopulmonary disease.   Electronically Signed   By: Lahoma Crocker M.D.   On: 09/01/2014 20:17   Scheduled: . atorvastatin  40 mg Oral QHS  . calcitRIOL  0.25 mcg Oral QODAY  . [START ON 09/03/2014] calcitRIOL  0.5 mcg Oral QODAY  . carvedilol  37.5 mg Oral BID WC  . darbepoetin (ARANESP) injection - NON-DIALYSIS  200 mcg Subcutaneous Q Thu-1800  . fluticasone  2 spray Each Nare Daily  . insulin aspart  0-15 Units Subcutaneous TID WC  . insulin detemir  35 Units Subcutaneous BID  . lactulose  30 g Oral TID  . lanthanum  2,000 mg Oral TID WC  . piperacillin-tazobactam (ZOSYN)  IV  2.25 g Intravenous Q8H  . sodium chloride  3 mL Intravenous Q12H  . sodium chloride  3 mL Intravenous Q12H  . tiotropium  18 mcg Inhalation Daily     LOS: 1 day   Saket Hellstrom C 09/02/2014,10:17 AM

## 2014-09-02 NOTE — Procedures (Signed)
TPA instillation of PD catheter done using 2cc tPA with 44ml Sterile water using aseptic technique. tPA allowed to dwell for 1 hour and then aspirated prior to attempting catheter flushes. Continues to have very sluggish return through PD catheter.  Will instill 8mL tPA to dwell overnight and have aspirated in the morning.  Elmarie Shiley MD [facility]. Office # [phone] Pager # [phone] 6:33 PM

## 2014-09-02 NOTE — Progress Notes (Signed)
**  Interval Note**  Paged by RN.  Patient having vomiting, initially food then bilious in nature.  Patient with bright red bleeding.  Rectal exam: 0.75cm x0.25cm external hemorrhoid. +TTP, no active bleeding.  Per RN smaller in size than before bleeding.  Patient just had a stool.  Stool liquid and light brown.  Non bloody, non tarry.  Patient endorses nausea.  Will order Zofran PRN nausea.  Just seen by surgery PA.  Dr Hulen Skains to see today as well.  Given that very little fluid was able to be obtained from HD site, cath will likely have to be removed.  Plan to follow up with surgery and renal recs.  Armanie Martine M. Lajuana Ripple, DO PGY-1, [facility] 09/02/14, 12:16pm

## 2014-09-02 NOTE — Progress Notes (Signed)
@   1140   Paged Dr Lajuana Ripple pager to inform of pt's nausea and vomitting, and 2 small bright red blood clots pt had trying to have BMs  this am and made aware of pt having hemorrhoids.  Pt had nausea this am which subsided with medication. He has vomited x2 recently, first unmeasureable on gown and floor and second approx 100cc green bile colored emesis.      Dr Lajuana Ripple came to unit, details given as above, pt currently on bedpan, no blood noted,light brown liquid stool, hemorrhoids actually look smaller than earlier this am (Dr Lajuana Ripple observed hemorrhoids and stool).  Waiting orders for antiemetic.

## 2014-09-02 NOTE — ED Notes (Signed)
Pt defecated and urinated.

## 2014-09-02 NOTE — Progress Notes (Signed)
09/02/2014 4:06 PM  Attempted another flush on the patient per md order.  Instilled about 400-500 cc of standard 2.5% dialysate.  Only drained about 200-250cc at most of milky, yellow-green fluid.  Fibrin noted.  Dr. Posey Pronto noted.  Will await further orders. Jonathon Bailey

## 2014-09-02 NOTE — ED Notes (Signed)
Patient placed in a hospital bed for comfort due to no step down beds

## 2014-09-02 NOTE — Progress Notes (Addendum)
09/02/2014  Assisted Dr. Posey Pronto in administering TPA.  2mg  alteplase instilled in PD catheter by Dr. Posey Pronto.  Will assist MD in drain in approximately one hour. Princella Pellegrini

## 2014-09-02 NOTE — ED Notes (Signed)
Per lab, pt has abdundant WBCs, poly and mono, and there are no organisms seen. Colin Rhein, MD notified.

## 2014-09-02 NOTE — Progress Notes (Signed)
Pt transferred via bed with monitor and O2 to unit 2H, room 2H22., with his belongings.

## 2014-09-02 NOTE — ED Notes (Signed)
Lab called with report from peritoneal fluid.  Intracellular microorganisms  Will send out and culture to return in 24-48 hours

## 2014-09-02 NOTE — Plan of Care (Signed)
Problem: Consults Goal: Peritonitis Patient Education See Patient Education Module for education specifics. Outcome: Progressing Goal: Skin Care Protocol Initiated - if Braden Score 18 or less If consults are not indicated, leave blank or document N/A Outcome: Completed/Met Date Met:  09/02/14 Goal: Nutrition Consult-if indicated Outcome: Not Applicable Date Met:  98/26/41 Goal: Diabetes Guidelines if Diabetic/Glucose > 140 If diabetic or lab glucose is > 140 mg/dl - Initiate Diabetes/Hyperglycemia Guidelines & Document Interventions  Outcome: Completed/Met Date Met:  09/02/14  Problem: Phase I Progression Outcomes Goal: Hemodynamically stable Outcome: Progressing Goal: Dyspnea controlled at rest Outcome: Progressing Goal: Pain controlled with appropriate interventions Outcome: Progressing Goal: Nausea/vomiting controlled with antiemetics Outcome: Completed/Met Date Met:  09/02/14

## 2014-09-02 NOTE — Progress Notes (Signed)
09/02/2014 10:45 AM  Attempted to drain fluid instilled last evening off of patient's abdomen.  Only retrieved about 20cc of milky, thick, yellow-green fluid off of patient.  I attempted to flush the patient's line with between 300-400cc of 2.5% standard dialysate, however, only drained off about 10-20cc of fluid from this flush.  Attempts at repositioning patient to obtain better output were unsuccessful.  Pt stated that he did have a bowel movement this am as well.  Pt c/o slight discomfort during the process but no outright pain.  Vitals remain stable.  Dr. Florene Glen notified, no orders received at this time.  Will await further instruction. Princella Pellegrini

## 2014-09-02 NOTE — Progress Notes (Signed)
09/02/2014 6:20 PM  Assisted Dr. Posey Pronto in aspirating TPA from catheter.  TPA aspirated by Dr. Posey Pronto, as well as about 20cc of milky yellow fluid with flecks of fibrin.  Attempted to flush patient per Dr. Posey Pronto at this time as well.  About 500cc 2.5% standard dialysate instilled into abdomen.  Patient was very slow to drain.  Only drained approximately 200-250cc of milky yellow cloudy fluid.  Dr. Posey Pronto at bedside and aware of output.  TPA to be administered again shortly once prepared by pharmacy.  Will page Dr. Posey Pronto when ready. Jonathon Bailey

## 2014-09-02 NOTE — Progress Notes (Signed)
09/02/2014 7:34 PM  Assisted Dr. Posey Pronto in instilling TPA into catheter.  TPA to dwell in catheter overnight.  No complications, patient tolerated well.   Princella Pellegrini

## 2014-09-03 LAB — BASIC METABOLIC PANEL
ANION GAP: 22 — AB (ref 5–15)
BUN: 98 mg/dL — ABNORMAL HIGH (ref 6–23)
CALCIUM: 9 mg/dL (ref 8.4–10.5)
CO2: 21 mEq/L (ref 19–32)
CREATININE: 11.59 mg/dL — AB (ref 0.50–1.35)
Chloride: 99 mEq/L (ref 96–112)
GFR calc Af Amer: 5 mL/min — ABNORMAL LOW (ref >90)
GFR, EST NON AFRICAN AMERICAN: 4 mL/min — AB (ref >90)
Glucose, Bld: 124 mg/dL — ABNORMAL HIGH (ref 70–99)
Potassium: 4.1 mEq/L (ref 3.7–5.3)
Sodium: 142 mEq/L (ref 137–147)

## 2014-09-03 LAB — GRAM STAIN

## 2014-09-03 LAB — URINE CULTURE: Colony Count: 3000

## 2014-09-03 LAB — GLUCOSE, CAPILLARY
GLUCOSE-CAPILLARY: 106 mg/dL — AB (ref 70–99)
GLUCOSE-CAPILLARY: 180 mg/dL — AB (ref 70–99)
GLUCOSE-CAPILLARY: 124 mg/dL — AB (ref 70–99)
Glucose-Capillary: 122 mg/dL — ABNORMAL HIGH (ref 70–99)
Glucose-Capillary: 126 mg/dL — ABNORMAL HIGH (ref 70–99)
Glucose-Capillary: 162 mg/dL — ABNORMAL HIGH (ref 70–99)

## 2014-09-03 LAB — CBC
HCT: 23 % — ABNORMAL LOW (ref 39.0–52.0)
Hemoglobin: 7.4 g/dL — ABNORMAL LOW (ref 13.0–17.0)
MCH: 30 pg (ref 26.0–34.0)
MCHC: 32.2 g/dL (ref 30.0–36.0)
MCV: 93.1 fL (ref 78.0–100.0)
Platelets: 206 10*3/uL (ref 150–400)
RBC: 2.47 MIL/uL — ABNORMAL LOW (ref 4.22–5.81)
RDW: 16.1 % — ABNORMAL HIGH (ref 11.5–15.5)
WBC: 10.9 10*3/uL — ABNORMAL HIGH (ref 4.0–10.5)

## 2014-09-03 MED ORDER — HYDRALAZINE HCL 20 MG/ML IJ SOLN
10.0000 mg | Freq: Once | INTRAMUSCULAR | Status: AC
  Administered 2014-09-03: 10 mg via INTRAVENOUS
  Filled 2014-09-03: qty 1

## 2014-09-03 MED ORDER — HEPARIN SODIUM (PORCINE) 1000 UNIT/ML IJ SOLN
1500.0000 [IU] | INTRAMUSCULAR | Status: DC | PRN
  Administered 2014-09-06: 1900 [IU] via INTRAPERITONEAL
  Filled 2014-09-03: qty 1.5
  Filled 2014-09-03: qty 2
  Filled 2014-09-03: qty 1.5

## 2014-09-03 MED ORDER — BISACODYL 10 MG RE SUPP
10.0000 mg | Freq: Once | RECTAL | Status: AC
  Administered 2014-09-03: 10 mg via RECTAL
  Filled 2014-09-03: qty 1

## 2014-09-03 MED ORDER — DELFLEX-LC/1.5% DEXTROSE 346 MOSM/L IP SOLN: INTRAPERITONEAL | Status: DC

## 2014-09-03 NOTE — Progress Notes (Signed)
[facility]ian[facility] Ostlund Progress Note    Subjective: Patient doing okay, but still having a lot of abdominal pain.  Catheter has not been drained since last night.  No fever.  Objective: Vital signs in last 24 hours: Temp:  [97.3 F (36.3 C)-99.3 F (37.4 C)] 97.6 F (36.4 C) (12/11 0300) Pulse Rate:  [91-108] 101 (12/11 0755) Resp:  [12-23] 14 (12/11 0755) BP: (116-172)/(62-91) 127/91 mmHg (12/11 0700) SpO2:  [83 %-97 %] 97 % (12/11 0755) Weight:  [131.4 kg (289 lb 11 oz)] 131.4 kg (289 lb 11 oz) (12/10 1702) Last BM Date: 09/02/14  Intake/Output from previous day: 12/10 0701 - 12/11 0700 In: 1490 [P.O.:100; I.V.:905; Blood:335; IV Piggyback:150] Out: [id number] [Urine:375; Emesis/NG output:1100] Intake/Output this shift:    General: Thirsty.  Says the pain is the same.  Lungs: Clear  Abd: Softer than yesterday evening, seems to have less tenderness, but I am not sure when he last had pain medication.  Hypoactive bowel sounds.  Extremities: No clinical signs or symptoms of DVT  Neuro: Intact  Lab Results:  @LABLAST2 (wbc:2,hgb:2,hct:2,plt:2) BMET  Recent Labs  09/01/14 1808 09/02/14 0224  NA 138 140  K 4.2 4.2  CL 97 98  CO2 16* 19  GLUCOSE 292* 194*  BUN 94* 94*  CREATININE 11.51* 11.28*  CALCIUM 9.2 9.1   PT/INR No results for input(s): LABPROT, INR in the last 72 hours. ABG  Recent Labs  09/01/14 1851  HCO3 20.8    Studies/Results: Ct Abdomen Pelvis Wo Contrast  09/02/2014   CLINICAL DATA:  Abdominal pain and distention. Stent from dialysis for fever and elevated blood pressure.  EXAM: CT ABDOMEN AND PELVIS WITHOUT CONTRAST  TECHNIQUE: Multidetector CT imaging of the abdomen and pelvis was performed following the standard protocol without IV contrast.  COMPARISON:  None.  FINDINGS: Atelectasis in the lung bases.  Coronary artery calcifications.  Small amount of free fluid in the upper abdomen, extending along the pericolic gutters to the pelvis. Small amount of free  air demonstrated in the upper abdomen with tiny gas bubble noted. A pelvic peritoneal dialysis catheter is noted to be in place, likely accounting for these changes. The unenhanced appearance of the liver, spleen, gallbladder, pancreas, adrenal glands, kidneys, abdominal aorta, inferior vena cava, and retroperitoneal lymph nodes is unremarkable. Stomach is decompressed. Stool-filled colon without distention. Prominent visceral adipose tissues.  Pelvis: Bladder wall is mildly thickened. Bladder infection not excluded. Prostate gland is not enlarged. No pelvic mass or lymphadenopathy. Appendix is not identified and may be surgically absent. Small periumbilical hernia containing fat and small bowel. Proximal small bowel are contrast filled with upper limits of normal caliber, measuring about 2.9 cm diameter. Distal small bowel loops are decompressed. Early or partial obstruction not entirely excluded. Degenerative changes in the lumbar spine. No destructive bone lesions appreciated.  IMPRESSION: Small amount of free fluid in the upper abdomen and pelvis with minimal free air demonstrated, likely related to pelvic peritoneal dialysis catheter. Small periumbilical hernia containing fat and small bowel. Contrast filled bowel demonstrate upper limits of normal caliber. Distal bowel are decompressed. Early or partial small bowel obstruction not excluded.   Electronically Signed   By: Lucienne Capers M.D.   On: 09/02/2014 02:01   Dg Abd Acute W/chest  09/01/2014   CLINICAL DATA:  Elevated blood pressure, lower abdominal pain  EXAM: ACUTE ABDOMEN SERIES (ABDOMEN 2 VIEW & CHEST 1 VIEW)  COMPARISON:  03/15/2014  FINDINGS: Cardiomediastinal silhouette is stable. No acute infiltrate or pleural effusion.  No pulmonary edema. There is nonspecific nonobstructive bowel gas pattern. Peritoneal dialysis catheter is noted. Moderate stool and nonspecific air-fluid level noted in right colon.  IMPRESSION: Negative abdominal  radiographs.  No acute cardiopulmonary disease.   Electronically Signed   By: Lahoma Crocker M.D.   On: 09/01/2014 20:17    Anti-infectives: Anti-infectives    Start     Dose/Rate Route Frequency Ordered Stop   09/02/14 0200  piperacillin-tazobactam (ZOSYN) IVPB 2.25 g     2.25 g100 mL/hr over 30 Minutes Intravenous Every 8 hours 09/01/14 1842     09/01/14 1830  piperacillin-tazobactam (ZOSYN) IVPB 2.25 g     2.25 g100 mL/hr over 30 Minutes Intravenous NOW 09/01/14 1824 09/01/14 1930   09/01/14 1829  vancomycin (VANCOCIN) 2,500 mg in sodium chloride 0.9 % 250 mL IVPB  Status:  Discontinued     2,500 mg250 mL/hr over 60 Minutes Intravenous Every Dialysis 09/01/14 1829 09/02/14 0855   09/01/14 1815  piperacillin-tazobactam (ZOSYN) IVPB 3.375 g  Status:  Discontinued     3.375 g100 mL/hr over 30 Minutes Intravenous  Once 09/01/14 1803 09/01/14 1824   09/01/14 1815  vancomycin (VANCOCIN) IVPB 1000 mg/200 mL premix  Status:  Discontinued     1,000 mg200 mL/hr over 60 Minutes Intravenous  Once 09/01/14 1803 09/01/14 1830      Assessment/Plan: s/p  Ice chips are okay.  May need to have catheter removal and washout of the abdomen.  No other sites for possible infection.  LOS: 2 days   [name]. [name], MD, FACS [phone] [place] 09/03/2014

## 2014-09-03 NOTE — Progress Notes (Signed)
Peritoneal cath  Continues to flush/drain slowly,with lots of fibrin in catheter line.Capd fill500 in then out ,1liter in now slow to drain with increasing discomfort.Will notify Renal MD.

## 2014-09-03 NOTE — Progress Notes (Signed)
Family Medicine Teaching Service Daily Progress Note Intern Pager: [phone]  Patient name: Jonathon Bailey Medical record number: [id number] Date of birth: 12-02-1955 Age: 58 y.o. Gender: male  Primary Care Provider: Thersa Salt, DO Consultants: Nephrology, Surgery Code Status: FULL (discussed on admission)  Pt Overview and Major Events to Date:  09/01/14: Patient with FOBT+ 09/02/14: NGT placed for decompression 09/03/14: Likely to OR today  Assessment and Plan: Jonathon Bailey is a 58 y.o. male presenting with generalized abdominal pain . PMH is significant for ESRD with peritoneal dialysis, T2DM, CAD, HTN, Chronic systolic and diastolic HF, h/o CVA, COPD  Abdominal pain: Peritoneal fluid cloudy and fever to 102F very suspicious for peritonitis. Abdominal xray with moderate stool burden but no acute findings. LA: 1.82>1.06. hgb 6.2 (baseline 7-8). Pt meets sepsis criteria with source of infection, tachycardia, tachypnea, and fever.  CT abdomen pelvis with small amount of free air ?from peritoneal HD site?  Cytology with leukocytosis and intracellular organisms  -Spoke with pathology who said to send any new fluid aspirated from cath for full cytology (specifically r/o malignancy) and add stat gram stain -c/s surgery, appreciate recs: NGT and bowel rest -c/s to renal, appreciate recs: to OR today  -peritoneal fluid studies & blood cx pending -Continue Vancomycin & Zosyn, pharmacy to dose Vanc. -lactulose held while NPO -monitor vitals closely  -keep on telemetry -Dilaudid 0.5mg  q2 PRN pain  Anemia: Baseline Hgb 7-8, currently with Hgb of 6.2. FOBT+, transfused 1 unit pRBCs 09/02/14. Post transfusion hgb 7.1. Hgb today 7.4 -Continue Aranesp -Will transfuse 1 more unit pRBCs with HD -consider GI consult -hold home plavix for now, restart when able  ESRD: Receives daily peritoneal HD. Sees Dr Elroy Channel. Missed Sun and Tues sessions. Cr: 11.59 (baseline 10-11). Slightly  acidotic on VBG pH 7.316. S/p 1 amp bicarb -HD site s/p TPA. Nephrology to reassess again this am. -Patient likely to go to OR today for alternative HD site -Hold home fosrenol as patient is NPO, restart as soon as possible -holding garamycin   Tachycardia: Stable. HR 90's. BP stable. EKG: RBBB, LPFB. HR likely d/t sepsis. Troponin negative x1 -management as above -Hold Coreg while NPO, consider IV metoprolol if tachy>120.  Chronic systolic and diastolic HF: ECHO 123456: EF 40-45%. -holding BB -Will limit IVF as able  HTN: BP stable overnight.  AM BP 153/80. S/p Hydralazine IV 10mg  x1 overnight   -Hold Coreg -Consider IV hydralazine PRN SBP> 150 or IV metoprolol for BP and HR control while NPO.  CAD & H/o CVA.: Patient without signs of ischemia on exam. Neurologically in tact.  -hold home Lipitor, Coreg -hold plavix at this time due to concern for GI bleed  T2DM: Last A1c: 7.4. On home levemir 75 u BID -Hold Levemir at pt NPO, resume at decreased dose when PO again -SSI, renal  COPD: -Continue Albuterol PRN & Spiriva daily  FEN/GI: SLIV, NPO PPx: SCDs (no heparin d/t GI bleed)  Disposition: Dispo pending surgery and renal recs  Subjective:  Patient reports that abdominal pain is unchanged from admission.  States that the pain medication helps but does not last long enough.  Patient changed from q3 to q2 Dilaudid 0.5mg  yesterday evening.  Patient reports discomfort from NGT.  I explained that we will try and remove it as soon as appropriate.  Patient voices good understanding of this.  Denies any more BM since yesterday afternoon.  No more bleeding from rectum.  Denies N/V/pain anywhere else but abdomen.  Objective:  Temp:  [97.3 F (36.3 C)-99.1 F (37.3 C)] 98.3 F (36.8 C) (12/11 0755) Pulse Rate:  [91-106] 97 (12/11 0800) Resp:  [12-23] 13 (12/11 0800) BP: (116-172)/(62-91) 153/80 mmHg (12/11 0800) SpO2:  [83 %-97 %] 90 % (12/11 0800) Weight:  [289 lb 11 oz (131.4  kg)] 289 lb 11 oz (131.4 kg) (12/10 1702) Physical Exam: General: awake, alert, lying in bed, uncomfortable but NAD Cardiovascular: tachycardic but regular rhythm, brisk cap refill Respiratory: CTAB, 2L O2 [place] in place, no increased WOB Abdomen: obese, markedly distended (moreso than yesterday), very TTP predominantly in the RUQ and LUQ, bowel sounds very difficult to ausculate Extremities: WWP, no edema, clubbing or cyanosis.  Laboratory:  Recent Labs Lab 09/02/14 0224 09/02/14 0503 09/03/14 0730  WBC 9.7 10.1 10.9*  HGB 7.1* 7.1* 7.4*  HCT 22.3* 21.9* 23.0*  PLT 201 209 206    Recent Labs Lab 09/01/14 1808 09/02/14 0224 09/03/14 0730  NA 138 140 142  K 4.2 4.2 4.1  CL 97 98 99  CO2 16* 19 21  BUN 94* 94* 98*  CREATININE 11.51* 11.28* 11.59*  CALCIUM 9.2 9.1 9.0  PROT 7.5  --   --   BILITOT 0.4  --   --   ALKPHOS 65  --   --   ALT 13  --   --   AST 10  --   --   GLUCOSE 292* 194* 124*    Imaging/Diagnostic Tests:  Ct Abdomen Pelvis Wo Contrast  09/02/2014   CLINICAL DATA:  Abdominal pain and distention. Stent from dialysis for fever and elevated blood pressure.  EXAM: CT ABDOMEN AND PELVIS WITHOUT CONTRAST  TECHNIQUE: Multidetector CT imaging of the abdomen and pelvis was performed following the standard protocol without IV contrast.  COMPARISON:  None.  FINDINGS: Atelectasis in the lung bases.  Coronary artery calcifications.  Small amount of free fluid in the upper abdomen, extending along the pericolic gutters to the pelvis. Small amount of free air demonstrated in the upper abdomen with tiny gas bubble noted. A pelvic peritoneal dialysis catheter is noted to be in place, likely accounting for these changes. The unenhanced appearance of the liver, spleen, gallbladder, pancreas, adrenal glands, kidneys, abdominal aorta, inferior vena cava, and retroperitoneal lymph nodes is unremarkable. Stomach is decompressed. Stool-filled colon without distention. Prominent visceral  adipose tissues.  Pelvis: Bladder wall is mildly thickened. Bladder infection not excluded. Prostate gland is not enlarged. No pelvic mass or lymphadenopathy. Appendix is not identified and may be surgically absent. Small periumbilical hernia containing fat and small bowel. Proximal small bowel are contrast filled with upper limits of normal caliber, measuring about 2.9 cm diameter. Distal small bowel loops are decompressed. Early or partial obstruction not entirely excluded. Degenerative changes in the lumbar spine. No destructive bone lesions appreciated.  IMPRESSION: Small amount of free fluid in the upper abdomen and pelvis with minimal free air demonstrated, likely related to pelvic peritoneal dialysis catheter. Small periumbilical hernia containing fat and small bowel. Contrast filled bowel demonstrate upper limits of normal caliber. Distal bowel are decompressed. Early or partial small bowel obstruction not excluded.   Electronically Signed   By: Lucienne Capers M.D.   On: 09/02/2014 02:01   Dg Abd Acute W/chest  09/01/2014   CLINICAL DATA:  Elevated blood pressure, lower abdominal pain  EXAM: ACUTE ABDOMEN SERIES (ABDOMEN 2 VIEW & CHEST 1 VIEW)  COMPARISON:  03/15/2014  FINDINGS: Cardiomediastinal silhouette is stable. No acute infiltrate or pleural effusion. No  pulmonary edema. There is nonspecific nonobstructive bowel gas pattern. Peritoneal dialysis catheter is noted. Moderate stool and nonspecific air-fluid level noted in right colon.  IMPRESSION: Negative abdominal radiographs.  No acute cardiopulmonary disease.   Electronically Signed   By: Lahoma Crocker M.D.   On: 09/01/2014 20:17    Janora Norlander, DO 09/03/2014, 9:14 AM PGY-1, [facility] Intern pager: [phone], text pages welcome

## 2014-09-03 NOTE — Progress Notes (Signed)
Patient  cath  tubing changed per policy, stringy fibrin  In cath, unable to pass dial. Reconnected to new drain bag , will leave down to drain.

## 2014-09-03 NOTE — Progress Notes (Signed)
Late entry          [facility]isted [facility] In removal of tpa >  Pd cath .Large amount fibrin noted in cath  But withdraws  With ease. In and out flushes per order. See  capd flow sheet.

## 2014-09-03 NOTE — Progress Notes (Signed)
Subjective:   Still having a lot of abdominal pain and nausea.   Objective Filed Vitals:   09/03/14 0600 09/03/14 0700 09/03/14 0755 09/03/14 0800  BP: 133/76 127/91  153/80  Pulse: 94 96 101 97  Temp:   98.3 F (36.8 C)   TempSrc:   Oral   Resp: 12 14 14 13   Height:      Weight:      SpO2: 90% 96% 97% 90%   Physical Exam General: alert and oriented. Moderate discomfort. NG tube in place Heart: RRR Lungs: CTA, unlabored  Abdomen: distended. Diffuse tenderness.  Extremities: no edema  Dialysis Access: PD catheter  Assessment/Plan: 1. Abdominal pain- surgery following. NGT to suction. Zosyn. Afebrile. Blood cultures- no growth. PD fluid culture- no organisms, CT- small amt free fluid in upper abd and pelvis. Small periumbilical hernia. ?small bowel obstruction- reports small BM yesterday 2. ESRD -TPA dwelled overnight in PD Cath. Removed this AM without difficulty but PD cath still not flushing or draining. Will likely need PD cath removal and temporary HD.- has been on HD in the past via cath.  K+ 4.1 3. Anemia - hgb 7.4 - 260mcg aranesp q thrusday. S/p 1 u RBC 12/10- order for second unit. FOBT + 4. Secondary hyperparathyroidism - Ca+ 9. Phos 6- continue binders when diet advanced 5. HTN/volume - 153/80, cont hydralazine 6. Nutrition - NPO 7. DM per primary.   Shelle Iron, NP [place] [phone] 09/03/2014,10:40 AM  LOS: 2 days   Renal Attending: Basically, no dialysis for several days.  PD fluid culture not done at [facility].  A lot of fibrin in fluid and flow has improved but dot realistic option today for dialysis.  I will ask IR to see and tentatively plan for [facility] in AM. Pami Wool C   Additional Objective Labs: Basic Metabolic Panel:  Recent Labs Lab 09/01/14 1808 09/02/14 0224 09/03/14 0730  NA 138 140 142  K 4.2 4.2 4.1  CL 97 98 99  CO2 16* 19 21  GLUCOSE 292* 194* 124*  BUN 94* 94* 98*  CREATININE 11.51* 11.28* 11.59*   CALCIUM 9.2 9.1 9.0  PHOS  --  6.0*  --    Liver Function Tests:  Recent Labs Lab 09/01/14 1808 09/02/14 0224  AST 10  --   ALT 13  --   ALKPHOS 65  --   BILITOT 0.4  --   PROT 7.5  --   ALBUMIN 3.0* 2.8*   No results for input(s): LIPASE, AMYLASE in the last 168 hours. CBC:  Recent Labs Lab 09/01/14 1808 09/02/14 0224 09/02/14 0503 09/03/14 0730  WBC 5.8 9.7 10.1 10.9*  NEUTROABS 4.2  --   --   --   HGB 6.2* 7.1* 7.1* 7.4*  HCT 19.3* 22.3* 21.9* 23.0*  MCV 95.5 93.3 93.2 93.1  PLT 223 201 209 206   Blood Culture    Component Value Date/Time   SDES PERITONEAL FLUID ABDOMEN 09/01/2014 2135   SDES PERITONEAL FLUID ABDOMEN 09/01/2014 2135   SPECREQUEST Normal 09/01/2014 2135   SPECREQUEST NONE 09/01/2014 2135   CULT PENDING 09/01/2014 2135   REPTSTATUS PENDING 09/01/2014 2135   REPTSTATUS 09/02/2014 FINAL 09/01/2014 2135    Cardiac Enzymes:  Recent Labs Lab 09/02/14 1316  TROPONINI <0.30   CBG:  Recent Labs Lab 09/02/14 1523 09/02/14 1946 09/02/14 2333 09/03/14 0340 09/03/14 0812  GLUCAP 139* 145* 122* 106* 126*   Iron Studies:  Recent Labs  09/02/14 0224  IRON 15*  TIBC 150*   @lablastinr3 @ Studies/Results: Ct Abdomen Pelvis Wo Contrast  09/02/2014   CLINICAL DATA:  Abdominal pain and distention. Stent from dialysis for fever and elevated blood pressure.  EXAM: CT ABDOMEN AND PELVIS WITHOUT CONTRAST  TECHNIQUE: Multidetector CT imaging of the abdomen and pelvis was performed following the standard protocol without IV contrast.  COMPARISON:  None.  FINDINGS: Atelectasis in the lung bases.  Coronary artery calcifications.  Small amount of free fluid in the upper abdomen, extending along the pericolic gutters to the pelvis. Small amount of free air demonstrated in the upper abdomen with tiny gas bubble noted. A pelvic peritoneal dialysis catheter is noted to be in place, likely accounting for these changes. The unenhanced appearance of the liver,  spleen, gallbladder, pancreas, adrenal glands, kidneys, abdominal aorta, inferior vena cava, and retroperitoneal lymph nodes is unremarkable. Stomach is decompressed. Stool-filled colon without distention. Prominent visceral adipose tissues.  Pelvis: Bladder wall is mildly thickened. Bladder infection not excluded. Prostate gland is not enlarged. No pelvic mass or lymphadenopathy. Appendix is not identified and may be surgically absent. Small periumbilical hernia containing fat and small bowel. Proximal small bowel are contrast filled with upper limits of normal caliber, measuring about 2.9 cm diameter. Distal small bowel loops are decompressed. Early or partial obstruction not entirely excluded. Degenerative changes in the lumbar spine. No destructive bone lesions appreciated.  IMPRESSION: Small amount of free fluid in the upper abdomen and pelvis with minimal free air demonstrated, likely related to pelvic peritoneal dialysis catheter. Small periumbilical hernia containing fat and small bowel. Contrast filled bowel demonstrate upper limits of normal caliber. Distal bowel are decompressed. Early or partial small bowel obstruction not excluded.   Electronically Signed   By: Lucienne Capers M.D.   On: 09/02/2014 02:01   Dg Abd Acute W/chest  09/01/2014   CLINICAL DATA:  Elevated blood pressure, lower abdominal pain  EXAM: ACUTE ABDOMEN SERIES (ABDOMEN 2 VIEW & CHEST 1 VIEW)  COMPARISON:  03/15/2014  FINDINGS: Cardiomediastinal silhouette is stable. No acute infiltrate or pleural effusion. No pulmonary edema. There is nonspecific nonobstructive bowel gas pattern. Peritoneal dialysis catheter is noted. Moderate stool and nonspecific air-fluid level noted in right colon.  IMPRESSION: Negative abdominal radiographs.  No acute cardiopulmonary disease.   Electronically Signed   By: Lahoma Crocker M.D.   On: 09/01/2014 20:17   Medications: . dextrose 5 % and 0.45% NaCl 75 mL/hr at 09/02/14 1904   . antiseptic oral  rinse  7 mL Mouth Rinse BID  . darbepoetin (ARANESP) injection - NON-DIALYSIS  200 mcg Subcutaneous Q Thu-1800  . fluticasone  2 spray Each Nare Daily  . insulin aspart  0-9 Units Subcutaneous Q4H  . piperacillin-tazobactam (ZOSYN)  IV  2.25 g Intravenous Q8H  . sodium chloride  3 mL Intravenous Q12H  . sodium chloride  3 mL Intravenous Q12H  . tiotropium  18 mcg Inhalation Daily

## 2014-09-03 NOTE — Plan of Care (Signed)
Problem: Consults Goal: Peritonitis Patient Education See Patient Education Module for education specifics.  Outcome: Progressing  Problem: Phase I Progression Outcomes Goal: Hemodynamically stable Outcome: Progressing Goal: Dyspnea controlled at rest Outcome: Progressing Goal: Pain controlled with appropriate interventions Outcome: Not Progressing Pt continues to experience abd pain. Medication frequency changed to help with pain control.

## 2014-09-04 ENCOUNTER — Inpatient Hospital Stay (HOSPITAL_COMMUNITY): Payer: Medicare Other

## 2014-09-04 DIAGNOSIS — N186 End stage renal disease: Secondary | ICD-10-CM | POA: Insufficient documentation

## 2014-09-04 DIAGNOSIS — K659 Peritonitis, unspecified: Secondary | ICD-10-CM

## 2014-09-04 LAB — CBC WITH DIFFERENTIAL/PLATELET
Basophils Absolute: 0 10*3/uL (ref 0.0–0.1)
Basophils Relative: 0 % (ref 0–1)
EOS ABS: 0.4 10*3/uL (ref 0.0–0.7)
Eosinophils Relative: 4 % (ref 0–5)
HCT: 22.1 % — ABNORMAL LOW (ref 39.0–52.0)
HEMOGLOBIN: 7.1 g/dL — AB (ref 13.0–17.0)
LYMPHS ABS: 1.3 10*3/uL (ref 0.7–4.0)
Lymphocytes Relative: 12 % (ref 12–46)
MCH: 30 pg (ref 26.0–34.0)
MCHC: 32.1 g/dL (ref 30.0–36.0)
MCV: 93.2 fL (ref 78.0–100.0)
MONOS PCT: 11 % (ref 3–12)
Monocytes Absolute: 1.3 10*3/uL — ABNORMAL HIGH (ref 0.1–1.0)
Neutro Abs: 8.3 10*3/uL — ABNORMAL HIGH (ref 1.7–7.7)
Neutrophils Relative %: 73 % (ref 43–77)
Platelets: 228 10*3/uL (ref 150–400)
RBC: 2.37 MIL/uL — ABNORMAL LOW (ref 4.22–5.81)
RDW: 15.6 % — ABNORMAL HIGH (ref 11.5–15.5)
WBC: 11.3 10*3/uL — ABNORMAL HIGH (ref 4.0–10.5)

## 2014-09-04 LAB — BODY FLUID CULTURE: Special Requests: NORMAL

## 2014-09-04 LAB — GLUCOSE, CAPILLARY
Glucose-Capillary: 116 mg/dL — ABNORMAL HIGH (ref 70–99)
Glucose-Capillary: 123 mg/dL — ABNORMAL HIGH (ref 70–99)
Glucose-Capillary: 134 mg/dL — ABNORMAL HIGH (ref 70–99)
Glucose-Capillary: 141 mg/dL — ABNORMAL HIGH (ref 70–99)
Glucose-Capillary: 162 mg/dL — ABNORMAL HIGH (ref 70–99)

## 2014-09-04 LAB — RENAL FUNCTION PANEL
Albumin: 2.4 g/dL — ABNORMAL LOW (ref 3.5–5.2)
Anion gap: 23 — ABNORMAL HIGH (ref 5–15)
BUN: 97 mg/dL — AB (ref 6–23)
CALCIUM: 8.8 mg/dL (ref 8.4–10.5)
CO2: 21 meq/L (ref 19–32)
CREATININE: 11.23 mg/dL — AB (ref 0.50–1.35)
Chloride: 98 mEq/L (ref 96–112)
GFR calc Af Amer: 5 mL/min — ABNORMAL LOW (ref >90)
GFR calc non Af Amer: 4 mL/min — ABNORMAL LOW (ref >90)
GLUCOSE: 146 mg/dL — AB (ref 70–99)
PHOSPHORUS: 7.5 mg/dL — AB (ref 2.3–4.6)
Potassium: 3.9 mEq/L (ref 3.7–5.3)
SODIUM: 142 meq/L (ref 137–147)

## 2014-09-04 LAB — CBC
HEMATOCRIT: 25.4 % — AB (ref 39.0–52.0)
Hemoglobin: 8.1 g/dL — ABNORMAL LOW (ref 13.0–17.0)
MCH: 29.6 pg (ref 26.0–34.0)
MCHC: 31.9 g/dL (ref 30.0–36.0)
MCV: 92.7 fL (ref 78.0–100.0)
PLATELETS: 242 10*3/uL (ref 150–400)
RBC: 2.74 MIL/uL — ABNORMAL LOW (ref 4.22–5.81)
RDW: 15.6 % — AB (ref 11.5–15.5)
WBC: 12 10*3/uL — AB (ref 4.0–10.5)

## 2014-09-04 LAB — PREPARE RBC (CROSSMATCH)

## 2014-09-04 LAB — HEPATITIS B SURFACE ANTIGEN: HEP B S AG: NEGATIVE

## 2014-09-04 MED ORDER — FENTANYL CITRATE 0.05 MG/ML IJ SOLN
INTRAMUSCULAR | Status: AC
  Filled 2014-09-04: qty 2

## 2014-09-04 MED ORDER — MIDAZOLAM HCL 2 MG/2ML IJ SOLN
INTRAMUSCULAR | Status: AC | PRN
  Administered 2014-09-04 (×2): 1 mg via INTRAVENOUS

## 2014-09-04 MED ORDER — HYDROMORPHONE HCL 1 MG/ML IJ SOLN
INTRAMUSCULAR | Status: AC
  Filled 2014-09-04: qty 1

## 2014-09-04 MED ORDER — LIDOCAINE HCL 1 % IJ SOLN
INTRAMUSCULAR | Status: AC
Start: 2014-09-04 — End: 2014-09-04
  Filled 2014-09-04: qty 20

## 2014-09-04 MED ORDER — SODIUM CHLORIDE 0.9 % IV SOLN
Freq: Once | INTRAVENOUS | Status: AC
  Administered 2014-09-06: 13:00:00 via INTRAVENOUS

## 2014-09-04 MED ORDER — CEFAZOLIN SODIUM-DEXTROSE 2-3 GM-% IV SOLR
2.0000 g | INTRAVENOUS | Status: AC
  Administered 2014-09-04: 2 g via INTRAVENOUS
  Filled 2014-09-04: qty 50

## 2014-09-04 MED ORDER — MIDAZOLAM HCL 2 MG/2ML IJ SOLN
INTRAMUSCULAR | Status: AC
  Filled 2014-09-04: qty 2

## 2014-09-04 MED ORDER — LABETALOL HCL 5 MG/ML IV SOLN
10.0000 mg | Freq: Once | INTRAVENOUS | Status: AC
  Administered 2014-09-04: 10 mg via INTRAVENOUS
  Filled 2014-09-04: qty 4

## 2014-09-04 MED ORDER — FENTANYL CITRATE 0.05 MG/ML IJ SOLN
INTRAMUSCULAR | Status: AC | PRN
  Administered 2014-09-04 (×2): 50 ug via INTRAVENOUS

## 2014-09-04 MED ORDER — HEPARIN SODIUM (PORCINE) 1000 UNIT/ML IJ SOLN
INTRAMUSCULAR | Status: AC
  Filled 2014-09-04: qty 1

## 2014-09-04 MED ORDER — HYDRALAZINE HCL 20 MG/ML IJ SOLN
10.0000 mg | INTRAMUSCULAR | Status: DC | PRN
  Administered 2014-09-04 – 2014-09-05 (×5): 10 mg via INTRAVENOUS
  Filled 2014-09-04 (×4): qty 1

## 2014-09-04 MED ORDER — DEXTROSE 5 % IV SOLN
2.0000 g | INTRAVENOUS | Status: DC
  Administered 2014-09-04 – 2014-09-07 (×3): 2 g via INTRAVENOUS
  Filled 2014-09-04 (×5): qty 2

## 2014-09-04 MED ORDER — HYDROCORTISONE ACE-PRAMOXINE 1-1 % RE FOAM
1.0000 | Freq: Two times a day (BID) | RECTAL | Status: DC
  Administered 2014-09-04 – 2014-09-10 (×9): 1 via RECTAL
  Filled 2014-09-04: qty 10

## 2014-09-04 NOTE — Sedation Documentation (Signed)
Patient is resting comfortably. 

## 2014-09-04 NOTE — Progress Notes (Signed)
Family Medicine Teaching Service Daily Progress Note Intern Pager: [phone]  Patient name: Jonathon Bailey Medical record number: [id number] Date of birth: [birth year] Age: 58 y.o. Gender: male  Primary Care Provider: Thersa Salt, DO Consultants: Nephrology, Surgery Code Status: FULL (discussed on admission)  Pt Overview and Major Events to Date:  09/01/14: Patient with FOBT+ 09/02/14: NGT placed for decompression 09/03/14: Likely to OR today  Assessment and Plan: TREV PACO is a 58 y.o. male presenting with generalized abdominal pain . PMH is significant for ESRD with peritoneal dialysis, T2DM, CAD, HTN, Chronic systolic and diastolic HF, h/o CVA, COPD  Abdominal pain: Peritoneal fluid cloudy and fever to 102F very suspicious for peritonitis. Abdominal xray with moderate stool burden but no acute findings. LA: 1.82>1.06. hgb 6.2 on admission (baseline 7-8). Pt meets sepsis criteria with source of infection, tachycardia, tachypnea, and fever.  CT abdomen pelvis with small amount of free air ?from peritoneal HD site?  Cytology with leukocytosis and intracellular organisms  -fluid culture with enterobacter - switched to fortaz per renal -c/s surgery, appreciate recs: NGT and bowel rest - plan to clamp NGT and if low output will remove -c/s to renal, appreciate recs: HD cath to be placed, rec removal of PD cath per surgery -BCx NGTD -monitor vitals closely  -keep on telemetry -Dilaudid 0.5mg  q2 PRN pain  Anemia: Baseline Hgb 9-10, on admission Hgb of 6.2. FOBT+, transfused 1 unit pRBCs 09/02/14. Post transfusion hgb 7.1. Hgb today 7.1. Potentially lower than baseline on admission due to possible GI bleed. Had colonoscopy in 2009 with no abnormalities noted.  -Continue Aranesp -consider transfusing additional unit with HD -monitor hgb closely -will need GI consult with continued bloody BMs -hold home plavix for now, restart when able  ESRD: Receives daily peritoneal HD. Sees  Dr Elroy Channel. Missed Sun and Tues sessions. Cr: 11.59 (baseline 10-11). Slightly acidotic on VBG pH 7.316. S/p 1 amp bicarb -PD site s/p TPA not functioning - to have HD access placed today, needs PD cath removed by surgery -Hold home fosrenol as patient is NPO, restart as soon as possible -holding garamycin   Tachycardia: Stable. HR 90's. BP stable. EKG: RBBB, LPFB. HR likely d/t sepsis. Troponin negative x1 -management as above -Hold Coreg while NPO, IV metoprolol if tachy>120.  Chronic systolic and diastolic HF: ECHO 123456: EF 40-45%. -holding BB -KVO fluids  HTN: BP mildly elevated overnight.  AM BP 167/93. S/p Hydralazine IV 10mg  x1 overnight   -Hold Coreg -Consider IV hydralazine PRN SBP> 160 or IV metoprolol for BP and HR control while NPO.  CAD & H/o CVA.: Patient without signs of ischemia on exam. Neurologically in tact.  -hold home Lipitor, Coreg - restart once taking PO -hold plavix at this time due to concern for GI bleed  T2DM: Last A1c: 7.4. On home levemir 75 u BID -Hold Levemir as pt NPO, resume at decreased dose when PO again -SSI, renal  COPD: -Continue Albuterol PRN & Spiriva daily  FEN/GI: KVO, NPO PPx: SCDs (no heparin d/t GI bleed)  Disposition: Dispo pending surgery and renal recs  Subjective:  Notes continued abdominal pain that is stable from admission. Endorses having a bloody BM this morning. Did not have blood per rectum yesterday. Notes discomfort from the NGT.   Objective: Temp:  [97.4 F (36.3 C)-100 F (37.8 C)] 99.4 F (37.4 C) (12/12 0700) Pulse Rate:  [94-101] 101 (12/12 0700) Resp:  [13-22] 13 (12/12 0700) BP: (139-181)/(77-93) 167/93 mmHg (12/12 0700) SpO2:  [  84 %-96 %] 92 % (12/12 0918) Physical Exam: General: awake, alert, lying in bed, uncomfortable but NAD Cardiovascular: tachycardic but regular rhythm Respiratory: CTAB, no increased WOB Abdomen: obese, markedly distended, TTP throughout, bowel sounds difficult to  ausculate Extremities: WWP, no edema, clubbing or cyanosis.  Laboratory:  Recent Labs Lab 09/02/14 0503 09/03/14 0730 09/04/14 0905  WBC 10.1 10.9* 11.3*  HGB 7.1* 7.4* 7.1*  HCT 21.9* 23.0* 22.1*  PLT 209 206 228    Recent Labs Lab 09/01/14 1808 09/02/14 0224 09/03/14 0730  NA 138 140 142  K 4.2 4.2 4.1  CL 97 98 99  CO2 16* 19 21  BUN 94* 94* 98*  CREATININE 11.51* 11.28* 11.59*  CALCIUM 9.2 9.1 9.0  PROT 7.5  --   --   BILITOT 0.4  --   --   ALKPHOS 65  --   --   ALT 13  --   --   AST 10  --   --   GLUCOSE 292* 194* 124*    Imaging/Diagnostic Tests:  No results found.  Leone Haven, MD 09/04/2014, 10:14 AM PGY-3, [place] Intern pager: [phone], text pages welcome

## 2014-09-04 NOTE — Progress Notes (Signed)
Patient remained attached to drain bag from pd cath .Scant fluid  With fibrin thick yellow in appearance noted in cath. Patient disconnected from drain bag

## 2014-09-04 NOTE — Consult Note (Signed)
Referring Provider: Dr. Hinton Rao (family practice teaching service) Primary Care Physician:  Thersa Salt, DO Primary Gastroenterologist:  None (unassigned)  Reason for Consultation:  Rectal bleeding  HPI: Jonathon Bailey is a 58 y.o. male admitted to the hospital 2 days ago with abdominal pain associated with peritonitis.   The history is obtained somewhat from the patient who is a little bit delirious, from his sister Deneise Lever, and from the referring physician.  He has a peritoneal catheter which is felt to be infected and is awaiting removal. His peritoneal fluid had 10,000 white cells, roughly 90% polys. Meanwhile, he has had a hemodialysis catheter placed by interventional radiology.   Prior to this, the patient had become impacted of stool; he tends to run constipated but apparently things got worse than normal and he tried to "dig it out" several times. After an enema, he passed 2 large stools which were apparently firm in character, and has had perianal pain. Since then, he has had 2 or 3 episodes of rectal bleeding in association with a bowel movement. Per discussion with his nurse today, it sounds as though the stool itself was brown, and that there was bright red blood associated with it but not mixed with it.  It appears that the patient's baseline hemoglobin is around 8; it was 8.2 back in June. On presentation to the emergency room, his hemoglobin was 6.2. He received a transfusion of 1 unit of packed red cells and since then, it has been remaining stable around 7.1.  The patient is very hungry and is asking for food. However, he is still experiencing perianal discomfort. Of note, he has an NG tube that is draining clear light brown fluid consistent with non-bloody gastric contents.  Since coming up from interventional radiology where he had his hemodialysis catheter placed, and where he received fentanyl and Versed, he has been slightly belligerent, insisting on eating, threatening  to pull out his NG tube, and so forth.  The patient has been under considerably increased stress recently, by virtue of the death of his wife just a month ago, and the birth of a granddaughter the same day. Past Medical History  Diagnosis Date  . Hyperlipidemia   . Hypertension   . Arthritis   . Anxiety   . Depression   . Chronic systolic heart failure [id number]    ECHO Feb 2013 showed LVEF low normal at 50-55%, +hypokinetic anterolateral wall and inferolateral wall.    . Obesity   . Macular edema   . Diabetic retinopathy   . Myocardial infarction     status post MI x2 and 3 stents placed in 2003  . Asthma     "as a child"  . Sleep apnea     "sleep w/CPAP sometimes" (03/15/2014)  . [id number])     "maybe monthly" (03/15/2014)  . Stroke ~ 2007; ~1987    "weak on right side; messed w/right side of brain; cry all the time"  . CKD (chronic kidney disease) stage 4, GFR 15-29 ml/min 08/30/2009    Progressive renal failure since 2008, creatinine 1.2 in 2008 up to 3.5 in 2012 and 3.2-5.0 in 2013. All UA's 2011-13 showed >300 protein on dipstick. Work-up in May 2011 showed negative Urine IFE and SPEP, ultrasound showed 12-13 cm kidneys with increased echogenicity and UPC ratio was 1.5 gm proteinuria.  Hgb A1C's from 2011 to 2013 were all between 9-11.  Patient saw Dr. Donnetta Hutching (vasc surgery) for HD access in Aug 2013 > vein  mapping was done and Dr. Donnetta Hutching felt the left arm (pt is R handed) was suitable for L arm Cimino radiocephalic fistula. Patient said he wasn't ready to consider doing dialysis and declined the surgery.     Marland Kitchen ESRD (end stage renal disease) on dialysis   . Type II diabetes mellitus     insulin dependent    Past Surgical History  Procedure Laterality Date  . Umbilical hernia repair    . Av fistula placement  10/17/2012    Procedure: ARTERIOVENOUS (AV) FISTULA CREATION;  Surgeon: Mal Misty, MD;  Location: [facility];  Service: Vascular;  Laterality: Left;  . Peritoneal  catheter insertion  03/15/2014  . Hernia repair    . Incisional hernia repair  03/15/2014  . Laparoscopic lysis of adhesions  03/15/2014  . Coronary angioplasty with stent placement  ~ 2002    "3"  . Refractive surgery Bilateral   . Capd insertion N/A 03/15/2014    Procedure: LAPAROSCOPIC INSERTION CONTINUOUS AMBULATORY PERITONEAL DIALYSIS CATHETER, LAPARASCOPIC INCISIONAL HERNIA  REPAIR  WITH MESH, OMENTOPEXY AND LYSIS OF ADHESIONS;  Surgeon: [name], MD;  Location: [facility] [facility];  Service: General;  Laterality: N/A;  . Shuntogram N/A 03/11/2013    Procedure: Fistulogram;  Surgeon: Conrad Lake [place], MD;  Location: [facility] CATH LAB;  Service: Cardiovascular;  Laterality: N/A;    Prior to Admission medications   Medication Sig Start Date End Date Taking? Authorizing Provider  atorvastatin (LIPITOR) 40 MG tablet Take 40 mg by mouth at bedtime.   Yes Historical Provider, MD  calcitRIOL (ROCALTROL) 0.25 MCG capsule Take 0.25-0.5 mcg by mouth daily. Alternates takes 1 tablet and 2 capsules the next day   Yes Historical Provider, MD  carvedilol (COREG) 25 MG tablet Take 37.5 mg by mouth 2 (two) times daily with a meal.   Yes Historical Provider, MD  clopidogrel (PLAVIX) 75 MG tablet Take 75 mg by mouth daily.   Yes Historical Provider, MD  fluticasone (FLONASE) 50 MCG/ACT nasal spray Place 2 sprays into both nostrils daily.   Yes Historical Provider, MD  gentamicin cream (GARAMYCIN) 0.1 % Apply 1 application topically daily. For dialysis site   Yes Historical Provider, MD  hydrALAZINE (APRESOLINE) 50 MG tablet Take 50 mg by mouth 3 (three) times daily.   Yes Historical Provider, MD  insulin detemir (LEVEMIR) 100 UNIT/ML injection Inject 75 Units into the skin 2 (two) times daily.   Yes Historical Provider, MD  lanthanum (FOSRENOL) 1000 MG chewable tablet Chew 1,000-2,000 mg by mouth 3 (three) times daily with meals. Takes 2 tabs with meals and 1-2 tabs with snacks   Yes Historical Provider, MD  tiotropium  (SPIRIVA) 18 MCG inhalation capsule Place 18 mcg into inhaler and inhale daily as needed (for shortness of breath).    Yes Historical Provider, MD  tobramycin (TOBREX) 0.3 % ophthalmic solution Place 1 drop into both eyes every 6 (six) hours as needed (for eye irritation).    Yes Historical Provider, MD  albuterol (PROVENTIL HFA;VENTOLIN HFA) 108 (90 BASE) MCG/ACT inhaler Inhale 2 puffs into the lungs every 4 (four) hours as needed for wheezing. For wheezing    Historical Provider, MD  HYDROcodone-acetaminophen (NORCO) 7.5-325 MG per tablet Take 1-2 tablets by mouth every 6 (six) hours as needed for moderate pain or severe pain. 04/28/14   Michael Boston, MD    Current Facility-Administered Medications  Medication Dose Route Frequency Provider Last Rate Last Dose  . 0.9 %  sodium chloride infusion  250 mL Intravenous PRN Leeanne Rio, MD 10 mL/hr at 09/02/14 1330 250 mL at 09/02/14 1330  . 0.9 %  sodium chloride infusion   Intravenous Once Leone Haven, MD      . acetaminophen (TYLENOL) tablet 650 mg  650 mg Oral Q6H PRN Leeanne Rio, MD   650 mg at 09/02/14 0108   Or  . acetaminophen (TYLENOL) suppository 650 mg  650 mg Rectal Q6H PRN Leeanne Rio, MD      . albuterol (PROVENTIL) (2.5 MG/3ML) 0.083% nebulizer solution 2.5 mg  2.5 mg Nebulization Q4H PRN Dickie La, MD      . antiseptic oral rinse (CPC / CETYLPYRIDINIUM CHLORIDE 0.05%) solution 7 mL  7 mL Mouth Rinse BID Dickie La, MD   7 mL at 09/03/14 2057  . cefTAZidime (FORTAZ) 2 g in dextrose 5 % 50 mL IVPB  2 g Intravenous Q24H Estanislado Emms, MD   2 g at 09/04/14 1143  . Darbepoetin Alfa (ARANESP) injection 200 mcg  200 mcg Subcutaneous Q Thu-1800 Jamal Maes, MD   200 mcg at 09/02/14 2058  . dialysis solution 1.5% low-MG/low-CA (DELFLEX) 3,000 mL dialysis solution   Peritoneal Dialysis UD Estanislado Emms, MD      . fentaNYL (SUBLIMAZE) 0.05 MG/ML injection           . fluticasone (FLONASE) 50 MCG/ACT nasal spray  2 spray  2 spray Each Nare Daily Leeanne Rio, MD   2 spray at 09/03/14 1115  . heparin 1000 UNIT/ML injection           . heparin injection 1,500 Units  1,500 Units Intraperitoneal PRN Estanislado Emms, MD      . hydrALAZINE (APRESOLINE) injection 10 mg  10 mg Intravenous Q4H PRN Olam Idler, MD   10 mg at 09/04/14 0809  . hydrocortisone-pramoxine (PROCTOFOAM-HC) rectal foam 1 applicator  1 applicator Rectal BID Ronald Lobo V, MD      . HYDROmorphone (DILAUDID) injection 0.5 mg  0.5 mg Intravenous Q2H PRN Janora Norlander, DO   0.5 mg at 09/04/14 1414  . insulin aspart (novoLOG) injection 0-9 Units  0-9 Units Subcutaneous Q4H Dimas Chyle, MD   1 Units at 09/04/14 0400  . lidocaine (XYLOCAINE) 1 % (with pres) injection           . midazolam (VERSED) 2 MG/2ML injection           . ondansetron (ZOFRAN) injection 4 mg  4 mg Intravenous Q6H PRN Janora Norlander, DO   4 mg at 09/02/14 1227  . sodium chloride 0.9 % injection 3 mL  3 mL Intravenous Q12H Leeanne Rio, MD   3 mL at 09/02/14 0854  . sodium chloride 0.9 % injection 3 mL  3 mL Intravenous Q12H Leeanne Rio, MD   3 mL at 09/03/14 2200  . sodium chloride 0.9 % injection 3 mL  3 mL Intravenous PRN Leeanne Rio, MD   3 mL at 09/03/14 2339  . tiotropium (SPIRIVA) inhalation capsule 18 mcg  18 mcg Inhalation Daily Leeanne Rio, MD   18 mcg at 09/04/14 [id number]    Allergies as of 09/01/2014 - Review Complete 09/01/2014  Allergen Reaction Noted  . Feraheme [ferumoxytol] Itching 10/16/2012    Family History  Problem Relation Age of Onset  . Asthma Mother   . Hyperlipidemia Mother   . Hypertension Mother   . Stroke Father   . Heart  attack Father   . Prostate cancer Father   . Deep vein thrombosis Father   . Cancer Father   . Diabetes Father   . Hyperlipidemia Father   . Hypertension Father   . Other Father     varicose veins  . Heart disease Father     before age 34  . Other Sister      varicose veins    History   Social History  . Marital Status: Divorced    Spouse Name: N/A    Number of Children: N/A  . Years of Education: N/A   Occupational History  . Not on file.   Social History Main Topics  . Smoking status: Former Smoker -- 1.00 packs/day for 15 years    Types: Cigarettes    Quit date: 09/24/1986  . Smokeless tobacco: Never Used  . Alcohol Use: Yes     Comment: stopped in 2013  . Drug Use: No  . Sexual Activity: Not Currently   Other Topics Concern  . Not on file   Social History Narrative    Review of Systems: Per discussion with his sister and his mother at the bedside, it sounds as though the patient usually has a good appetite but that he was starting to "not be himself" approximately 10 days prior to admission. As noted above, the patient does tend to be constipated from time to time, and has had periodic rectal bleeding. He underwent a screening colonoscopy by Dr. Collene Mares in 2009, which was normal, including the absence of any noted hemorrhoids, diverticulosis, or colitis.   Physical Exam: Vital signs in last 24 hours: Temp:  [98 F (36.7 C)-100 F (37.8 C)] 99.3 F (37.4 C) (12/12 1515) Pulse Rate:  [92-102] 96 (12/12 1600) Resp:  [10-22] 20 (12/12 1515) BP: (118-181)/(71-93) 146/89 mmHg (12/12 1600) SpO2:  [87 %-96 %] 93 % (12/12 1515) Weight:  [131 kg (288 lb 12.8 oz)] 131 kg (288 lb 12.8 oz) (12/12 1515) Last BM Date: 09/02/14  This is an overweight African-American male, somewhat restless but not frankly and coherent, just somewhat inappropriate. He is anicteric and without pallor. The chest is clear, heart is without murmur or arrhythmia, and the abdomen is distended and somewhat obese, mildly tender but not rigid. Because of his belligerence, I elected not to attempt a rectal exam at this time.  Intake/Output from previous day: 12/11 0701 - 12/12 0700 In: 1950 [I.V.:1800; IV Piggyback:150] Out: 1525 [Urine:1025; Emesis/NG  output:500] Intake/Output this shift: Total I/O In: 785 [I.V.:400; Blood:335; IV Piggyback:50] Out: 300 [Emesis/NG output:300]  Lab Results:  Recent Labs  09/02/14 0503 09/03/14 0730 09/04/14 0905  WBC 10.1 10.9* 11.3*  HGB 7.1* 7.4* 7.1*  HCT 21.9* 23.0* 22.1*  PLT 209 206 228   BMET  Recent Labs  09/02/14 0224 09/03/14 0730 09/04/14 1550  NA 140 142 142  K 4.2 4.1 3.9  CL 98 99 98  CO2 19 21 21   GLUCOSE 194* 124* 146*  BUN 94* 98* 97*  CREATININE 11.28* 11.59* 11.23*  CALCIUM 9.1 9.0 8.8   LFT  Recent Labs  09/01/14 1808  09/04/14 1550  PROT 7.5  --   --   ALBUMIN 3.0*  < > 2.4*  AST 10  --   --   ALT 13  --   --   ALKPHOS 65  --   --   BILITOT 0.4  --   --   < > = values in this interval not displayed.  PT/INR No results for input(s): LABPROT, INR in the last 72 hours.  Studies/Results: Ir Fluoro Guide Cv Line Right  09/04/2014   CLINICAL DATA:  Renal disease, needs dialysis. Poorly functioning PD catheter.  EXAM: TUNNELED HEMODIALYSIS CATHETER PLACEMENT WITH ULTRASOUND AND FLUOROSCOPIC GUIDANCE  TECHNIQUE: The procedure, risks, benefits, and alternatives were explained to the patient. Questions regarding the procedure were encouraged and answered. The patient understands and consents to the procedure. As antibiotic prophylaxis, cefazolin was ordered pre-procedure and administered intravenously within one hour of incision.Patency of the right IJ vein was confirmed with ultrasound with image documentation. An appropriate skin site was determined. Region was prepped using maximum barrier technique including cap and mask, sterile gown, sterile gloves, large sterile sheet, and Chlorhexidine as cutaneous antisepsis. The region was infiltrated locally with 1% lidocaine.  Intravenous Fentanyl and Versed were administered as conscious sedation during continuous cardiorespiratory monitoring by the radiology RN, with a total moderate sedation time of 13 minutes.  Under  real-time ultrasound guidance, the right IJ vein was accessed with a 21 gauge micropuncture needle; the needle tip within the vein was confirmed with ultrasound image documentation. Needle exchanged over the 018 guidewire for transitional dilator, which allowed advancement of a Benson wire into the IVC. Over this, an MPA catheter was advanced. A Hemosplit 19 hemodialysis catheter was tunneled from the right anterior chest wall approach to the right IJ dermatotomy site. The MPA catheter was exchanged over an Amplatz wire for serial vascular dilators which allow placement of a peel-away sheath, through which the catheter was advanced under intermittent fluoroscopy, positioned with its tips in the proximal and midright atrium. Spot chest radiograph confirms good catheter position. No pneumothorax. Catheter was flushed and primed per protocol. Catheter secured externally with O Prolene sutures. The right IJ dermatotomy site was closed with Dermabond.  COMPLICATIONS: COMPLICATIONS None immediate  FLUOROSCOPY TIME:  18 seconds  COMPARISON:  None  IMPRESSION: 1. Technically successful placement of tunneled right IJ hemodialysis catheter with ultrasound and fluoroscopic guidance. Ready for routine use.   Electronically Signed   By: Arne Cleveland M.D.   On: 09/04/2014 12:05   Ir US Guide Vasc Access Right  09/04/2014   CLINICAL DATA:  Renal disease, needs dialysis. Poorly functioning PD catheter.  EXAM: TUNNELED HEMODIALYSIS CATHETER PLACEMENT WITH ULTRASOUND AND FLUOROSCOPIC GUIDANCE  TECHNIQUE: The procedure, risks, benefits, and alternatives were explained to the patient. Questions regarding the procedure were encouraged and answered. The patient understands and consents to the procedure. As antibiotic prophylaxis, cefazolin was ordered pre-procedure and administered intravenously within one hour of incision.Patency of the right IJ vein was confirmed with ultrasound with image documentation. An appropriate skin site  was determined. Region was prepped using maximum barrier technique including cap and mask, sterile gown, sterile gloves, large sterile sheet, and Chlorhexidine as cutaneous antisepsis. The region was infiltrated locally with 1% lidocaine.  Intravenous Fentanyl and Versed were administered as conscious sedation during continuous cardiorespiratory monitoring by the radiology RN, with a total moderate sedation time of 13 minutes.  Under real-time ultrasound guidance, the right IJ vein was accessed with a 21 gauge micropuncture needle; the needle tip within the vein was confirmed with ultrasound image documentation. Needle exchanged over the 018 guidewire for transitional dilator, which allowed advancement of a Benson wire into the IVC. Over this, an MPA catheter was advanced. A Hemosplit 19 hemodialysis catheter was tunneled from the right anterior chest wall approach to the right IJ dermatotomy site. The MPA catheter was exchanged  over an Amplatz wire for serial vascular dilators which allow placement of a peel-away sheath, through which the catheter was advanced under intermittent fluoroscopy, positioned with its tips in the proximal and midright atrium. Spot chest radiograph confirms good catheter position. No pneumothorax. Catheter was flushed and primed per protocol. Catheter secured externally with O Prolene sutures. The right IJ dermatotomy site was closed with Dermabond.  COMPLICATIONS: COMPLICATIONS None immediate  FLUOROSCOPY TIME:  18 seconds  COMPARISON:  None  IMPRESSION: 1. Technically successful placement of tunneled right IJ hemodialysis catheter with ultrasound and fluoroscopic guidance. Ready for routine use.   Electronically Signed   By: Arne Cleveland M.D.   On: 09/04/2014 12:05    Impression: 1. Small volume hematochezia 2. Significant drop in hemoglobin compared to baseline 6 months ago; however, it is not clear when that drop occurred, and specifically, whether or not it is related to his  current hematochezia which appears to be quite small in volume 3. Chronic constipation with recent impaction, status post attempts at digital disimpaction, and relief with enema  Discussion: I think the most likely situation here is excoriated or inflamed hemorrhoids, although stercoral ulceration from fecal impaction would be another possibility. With a negative colonoscopy 6 years ago, diverticulosis or cancer are both unlikely.  Plan: 1. Flexible sigmoidoscopy in the morning. The procedure and its minimal risks were discussed with the patient and he is agreeable. "Do what you've got to do."  2. In the meantime, I will use proctofoam to help diminish his perianal irritation and soreness.  3. The patient would probably benefit from chronic Miralax to help maintain defecation and prevent constipation and fecal impaction.    LOS: 3 days   Meril Dray V  09/04/2014, 4:33 PM

## 2014-09-04 NOTE — Progress Notes (Signed)
Assessment/Plan: 1 PD associated peritonitis with enterobacter cloacae, prob fecal origin(recent self disimpaction) 2 ESRD  Plan:  He will get a tunneled dialysis catheter today            The PD catheter will ned to be removed by surgery.  I do not think he is a good candidate for this modality of renal replacement.            Stop Zosyn and Vancomycin, begin [place] daily            Stop IVFs  Subjective: Interval History: PD catheter is not working  Objective: Vital signs in last 24 hours: Temp:  [97.4 F (36.3 C)-100 F (37.8 C)] 100 F (37.8 C) (12/12 0300) Pulse Rate:  [94-106] 101 (12/12 0300) Resp:  [13-25] 15 (12/12 0300) BP: (139-183)/(73-90) 164/77 mmHg (12/12 0300) SpO2:  [84 %-96 %] 94 % (12/12 0300) Weight change:   Intake/Output from previous day: 12/11 0701 - 12/12 0700 In: 1950 [I.V.:1800; IV Piggyback:150] Out: 1425 [Urine:925; Emesis/NG output:500] Intake/Output this shift:    General appearance: alert and cooperative GI: markedly distended and mildly tender Extremities: edema tr edema  Lab Results:  Recent Labs  09/02/14 0503 09/03/14 0730  WBC 10.1 10.9*  HGB 7.1* 7.4*  HCT 21.9* 23.0*  PLT 209 206   BMET:  Recent Labs  09/02/14 0224 09/03/14 0730  NA 140 142  K 4.2 4.1  CL 98 99  CO2 19 21  GLUCOSE 194* 124*  BUN 94* 98*  CREATININE 11.28* 11.59*  CALCIUM 9.1 9.0   No results for input(s): PTH in the last 72 hours. Iron Studies:  Recent Labs  09/02/14 0224  IRON 15*  TIBC 150*   Studies/Results: No results found.  Scheduled: . antiseptic oral rinse  7 mL Mouth Rinse BID  . darbepoetin (ARANESP) injection - NON-DIALYSIS  200 mcg Subcutaneous Q Thu-1800  . dialysis solution for CAPD/[facility])   Peritoneal Dialysis UD  . fluticasone  2 spray Each Nare Daily  . insulin aspart  0-9 Units Subcutaneous Q4H  . piperacillin-tazobactam (ZOSYN)  IV  2.25 g Intravenous Q8H  . sodium chloride  3 mL Intravenous Q12H  . sodium  chloride  3 mL Intravenous Q12H  . tiotropium  18 mcg Inhalation Daily     LOS: 3 days   Jonathon Bailey C 09/04/2014,8:20 AM

## 2014-09-04 NOTE — Consult Note (Signed)
Chief Complaint: Chief Complaint  Patient presents with  . Abdominal Pain  . Fever  Malfunctioning Peritoneal dialysis catheter  Referring Physician(s): Dr Florene Glen  History of Present Illness: Jonathon Bailey is a 58 y.o. male  Pt to ED 12/11 abd pain; fever PD catheter malfunction--not able to flush---running extremely slow Wbc wnl; afeb with low grade temp this am PD cath not working this am Request for tunneled hemodialysis catheter per Dr Florene Glen I have seen and examined pt Now scheduled for same  Past Medical History  Diagnosis Date  . Hyperlipidemia   . Hypertension   . Arthritis   . Anxiety   . Depression   . Chronic systolic heart failure [id number]    ECHO Feb 2013 showed LVEF low normal at 50-55%, +hypokinetic anterolateral wall and inferolateral wall.    . Obesity   . Macular edema   . Diabetic retinopathy   . Myocardial infarction     status post MI x2 and 3 stents placed in 2003  . Asthma     "as a child"  . Sleep apnea     "sleep w/CPAP sometimes" (03/15/2014)  . [id number])     "maybe monthly" (03/15/2014)  . Stroke ~ 2007; ~1987    "weak on right side; messed w/right side of brain; cry all the time"  . CKD (chronic kidney disease) stage 4, GFR 15-29 ml/min 08/30/2009    Progressive renal failure since 2008, creatinine 1.2 in 2008 up to 3.5 in 2012 and 3.2-5.0 in 2013. All UA's 2011-13 showed >300 protein on dipstick. Work-up in May 2011 showed negative Urine IFE and SPEP, ultrasound showed 12-13 cm kidneys with increased echogenicity and UPC ratio was 1.5 gm proteinuria.  Hgb A1C's from 2011 to 2013 were all between 9-11.  Patient saw Dr. Donnetta Hutching (vasc surgery) for HD access in Aug 2013 > vein mapping was done and Dr. Donnetta Hutching felt the left arm (pt is R handed) was suitable for L arm Cimino radiocephalic fistula. Patient said he wasn't ready to consider doing dialysis and declined the surgery.     Marland Kitchen ESRD (end stage renal disease) on dialysis   . Type II  diabetes mellitus     insulin dependent    Past Surgical History  Procedure Laterality Date  . Umbilical hernia repair    . Av fistula placement  10/17/2012    Procedure: ARTERIOVENOUS (AV) FISTULA CREATION;  Surgeon: Mal Misty, MD;  Location: [facility];  Service: Vascular;  Laterality: Left;  . Peritoneal catheter insertion  03/15/2014  . Hernia repair    . Incisional hernia repair  03/15/2014  . Laparoscopic lysis of adhesions  03/15/2014  . Coronary angioplasty with stent placement  ~ 2002    "3"  . Refractive surgery Bilateral   . Capd insertion N/A 03/15/2014    Procedure: LAPAROSCOPIC INSERTION CONTINUOUS AMBULATORY PERITONEAL DIALYSIS CATHETER, LAPARASCOPIC INCISIONAL HERNIA  REPAIR  WITH MESH, OMENTOPEXY AND LYSIS OF ADHESIONS;  Surgeon: [name], MD;  Location: [facility];  Service: General;  Laterality: N/A;  . Shuntogram N/A 03/11/2013    Procedure: Fistulogram;  Surgeon: Conrad Liborio Negron Torres, MD;  Location: [facility] CATH LAB;  Service: Cardiovascular;  Laterality: N/A;    Allergies: Feraheme  Medications: Prior to Admission medications   Medication Sig Start Date End Date Taking? Authorizing Provider  atorvastatin (LIPITOR) 40 MG tablet Take 40 mg by mouth at bedtime.   Yes Historical Provider, MD  calcitRIOL (ROCALTROL) 0.25 MCG capsule Take 0.25-0.5 mcg  by mouth daily. Alternates takes 1 tablet and 2 capsules the next day   Yes Historical Provider, MD  carvedilol (COREG) 25 MG tablet Take 37.5 mg by mouth 2 (two) times daily with a meal.   Yes Historical Provider, MD  clopidogrel (PLAVIX) 75 MG tablet Take 75 mg by mouth daily.   Yes Historical Provider, MD  fluticasone (FLONASE) 50 MCG/ACT nasal spray Place 2 sprays into both nostrils daily.   Yes Historical Provider, MD  gentamicin cream (GARAMYCIN) 0.1 % Apply 1 application topically daily. For dialysis site   Yes Historical Provider, MD  hydrALAZINE (APRESOLINE) 50 MG tablet Take 50 mg by mouth 3 (three) times daily.   Yes  Historical Provider, MD  insulin detemir (LEVEMIR) 100 UNIT/ML injection Inject 75 Units into the skin 2 (two) times daily.   Yes Historical Provider, MD  lanthanum (FOSRENOL) 1000 MG chewable tablet Chew 1,000-2,000 mg by mouth 3 (three) times daily with meals. Takes 2 tabs with meals and 1-2 tabs with snacks   Yes Historical Provider, MD  tiotropium (SPIRIVA) 18 MCG inhalation capsule Place 18 mcg into inhaler and inhale daily as needed (for shortness of breath).    Yes Historical Provider, MD  tobramycin (TOBREX) 0.3 % ophthalmic solution Place 1 drop into both eyes every 6 (six) hours as needed (for eye irritation).    Yes Historical Provider, MD  albuterol (PROVENTIL HFA;VENTOLIN HFA) 108 (90 BASE) MCG/ACT inhaler Inhale 2 puffs into the lungs every 4 (four) hours as needed for wheezing. For wheezing    Historical Provider, MD  HYDROcodone-acetaminophen (NORCO) 7.5-325 MG per tablet Take 1-2 tablets by mouth every 6 (six) hours as needed for moderate pain or severe pain. 04/28/14   Michael Boston, MD    Family History  Problem Relation Age of Onset  . Asthma Mother   . Hyperlipidemia Mother   . Hypertension Mother   . Stroke Father   . Heart attack Father   . Prostate cancer Father   . Deep vein thrombosis Father   . Cancer Father   . Diabetes Father   . Hyperlipidemia Father   . Hypertension Father   . Other Father     varicose veins  . Heart disease Father     before age 71  . Other Sister     varicose veins    History   Social History  . Marital Status: Divorced    Spouse Name: N/A    Number of Children: N/A  . Years of Education: N/A   Social History Main Topics  . Smoking status: Former Smoker -- 1.00 packs/day for 15 years    Types: Cigarettes    Quit date: 09/24/1986  . Smokeless tobacco: Never Used  . Alcohol Use: Yes     Comment: stopped in 2013  . Drug Use: No  . Sexual Activity: Not Currently   Other Topics Concern  . None   Social History Narrative      Review of Systems: A 12 point ROS discussed and pertinent positives are indicated in the HPI above.  All other systems are negative.  Review of Systems  Constitutional: Positive for activity change and fatigue.  Respiratory: Positive for shortness of breath. Negative for cough.   Cardiovascular: Negative for chest pain.  Gastrointestinal: Positive for abdominal pain and abdominal distention.  Neurological: Positive for weakness.  Psychiatric/Behavioral: Negative for behavioral problems and confusion.    Vital Signs: BP 167/93 mmHg  Pulse 101  Temp(Src) 99.4 F (37.4  C) (Oral)  Resp 13  Ht 5\' 11"  (1.803 m)  Wt 131.4 kg (289 lb 11 oz)  BMI 40.42 kg/m2  SpO2 92%  Physical Exam  Constitutional: He is oriented to person, place, and time. He appears well-nourished.  Cardiovascular: Normal rate.   No murmur heard. Pulmonary/Chest: Effort normal. No respiratory distress. He has no wheezes.  Abdominal: Soft. He exhibits distension. There is tenderness.  Musculoskeletal: Normal range of motion.  Neurological: He is alert and oriented to person, place, and time.  Skin: Skin is warm and dry.  Psychiatric: He has a normal mood and affect. His behavior is normal. Judgment and thought content normal.  Nursing note and vitals reviewed.   Imaging: Ct Abdomen Pelvis Wo Contrast  09/02/2014   CLINICAL DATA:  Abdominal pain and distention. Stent from dialysis for fever and elevated blood pressure.  EXAM: CT ABDOMEN AND PELVIS WITHOUT CONTRAST  TECHNIQUE: Multidetector CT imaging of the abdomen and pelvis was performed following the standard protocol without IV contrast.  COMPARISON:  None.  FINDINGS: Atelectasis in the lung bases.  Coronary artery calcifications.  Small amount of free fluid in the upper abdomen, extending along the pericolic gutters to the pelvis. Small amount of free air demonstrated in the upper abdomen with tiny gas bubble noted. A pelvic peritoneal dialysis catheter is  noted to be in place, likely accounting for these changes. The unenhanced appearance of the liver, spleen, gallbladder, pancreas, adrenal glands, kidneys, abdominal aorta, inferior vena cava, and retroperitoneal lymph nodes is unremarkable. Stomach is decompressed. Stool-filled colon without distention. Prominent visceral adipose tissues.  Pelvis: Bladder wall is mildly thickened. Bladder infection not excluded. Prostate gland is not enlarged. No pelvic mass or lymphadenopathy. Appendix is not identified and may be surgically absent. Small periumbilical hernia containing fat and small bowel. Proximal small bowel are contrast filled with upper limits of normal caliber, measuring about 2.9 cm diameter. Distal small bowel loops are decompressed. Early or partial obstruction not entirely excluded. Degenerative changes in the lumbar spine. No destructive bone lesions appreciated.  IMPRESSION: Small amount of free fluid in the upper abdomen and pelvis with minimal free air demonstrated, likely related to pelvic peritoneal dialysis catheter. Small periumbilical hernia containing fat and small bowel. Contrast filled bowel demonstrate upper limits of normal caliber. Distal bowel are decompressed. Early or partial small bowel obstruction not excluded.   Electronically Signed   By: Lucienne Capers M.D.   On: 09/02/2014 02:01   Dg Abd Acute W/chest  09/01/2014   CLINICAL DATA:  Elevated blood pressure, lower abdominal pain  EXAM: ACUTE ABDOMEN SERIES (ABDOMEN 2 VIEW & CHEST 1 VIEW)  COMPARISON:  03/15/2014  FINDINGS: Cardiomediastinal silhouette is stable. No acute infiltrate or pleural effusion. No pulmonary edema. There is nonspecific nonobstructive bowel gas pattern. Peritoneal dialysis catheter is noted. Moderate stool and nonspecific air-fluid level noted in right colon.  IMPRESSION: Negative abdominal radiographs.  No acute cardiopulmonary disease.   Electronically Signed   By: Lahoma Crocker M.D.   On: 09/01/2014 20:17     Labs:  CBC:  Recent Labs  09/01/14 1808 09/02/14 0224 09/02/14 0503 09/03/14 0730  WBC 5.8 9.7 10.1 10.9*  HGB 6.2* 7.1* 7.1* 7.4*  HCT 19.3* 22.3* 21.9* 23.0*  PLT 223 201 209 206    COAGS: No results for input(s): INR, APTT in the last 8760 hours.  BMP:  Recent Labs  03/18/14 0851 09/01/14 1808 09/02/14 0224 09/03/14 0730  NA 137 138 140 142  K  4.7 4.2 4.2 4.1  CL 95* 97 98 99  CO2 24 16* 19 21  GLUCOSE 100* 292* 194* 124*  BUN 60* 94* 94* 98*  CALCIUM 9.0 9.2 9.1 9.0  CREATININE 11.15* 11.51* 11.28* 11.59*  GFRNONAA 4* 4* 4* 4*  GFRAA 5* 5* 5* 5*    LIVER FUNCTION TESTS:  Recent Labs  03/16/14 1447 03/18/14 0851 09/01/14 1808 09/02/14 0224  BILITOT  --   --  0.4  --   AST  --   --  10  --   ALT  --   --  13  --   ALKPHOS  --   --  65  --   PROT  --   --  7.5  --   ALBUMIN 3.5 3.3* 3.0* 2.8*    TUMOR MARKERS: No results for input(s): AFPTM, CEA, CA199, CHROMGRNA in the last 8760 hours.  Assessment and Plan:  Malfunctioning PD catheter Now scheduled for tunneled HD cath per Dr Florene Glen Pt aware of procedure benefits and risks and agreeable to proceed Consent signed andin chart afeb- but with low grade temp this am Wbc wnl Ancef on call   Thank you for this interesting consult.  I greatly enjoyed meeting Jonathon Bailey and look forward to participating in their care.     I spent a total of 20 minutes face to face in clinical consultation, greater than 50% of which was counseling/coordinating care for HD cath  Signed: Iasia Forcier A 09/04/2014, 9:45 AM

## 2014-09-04 NOTE — Sedation Documentation (Signed)
Tunneled HD cath placed in RIJ per Dr. Vernard Gambles.

## 2014-09-04 NOTE — Procedures (Signed)
R IJ HD catheter placement No complication No blood loss. See complete dictation in [facility].

## 2014-09-04 NOTE — Progress Notes (Signed)
[name] Progress Note    Subjective: Patient had a large bloody bowel movement today.  Wants to eat.  Not much out of NGT, but just found out that suction not working overnight.  When corrected this AM not much out.  Objective: Vital signs in last 24 hours: Temp:  [97.4 F (36.3 C)-100 F (37.8 C)] 100 F (37.8 C) (12/12 0300) Pulse Rate:  [94-106] 101 (12/12 0300) Resp:  [13-25] 15 (12/12 0300) BP: (139-183)/(73-90) 164/77 mmHg (12/12 0300) SpO2:  [84 %-96 %] 94 % (12/12 0300) Last BM Date: 09/02/14  Intake/Output from previous day: 12/11 0701 - 12/12 0700 In: 1575 [I.V.:1425; IV Piggyback:150] Out: 1425 [Urine:925; Emesis/NG output:500] Intake/Output this shift:    General: Noa cute distress although abdomen is still tight.  Lungs: Clear to auscultation  Abd: Soft good bowel sounds.  Only 500cc out of NGT all yesterday.  Tender, but no peritonitis.  Large bloody bowel movement this AM but the patient has significant hemorrhoids.  Extremities: No DVT signs or symptoms.  Neuro: Intact.  Lab Results:  @LABLAST2 (wbc:2,hgb:2,hct:2,plt:2) BMET  Recent Labs  09/02/14 0224 09/03/14 0730  NA 140 142  K 4.2 4.1  CL 98 99  CO2 19 21  GLUCOSE 194* 124*  BUN 94* 98*  CREATININE 11.28* 11.59*  CALCIUM 9.1 9.0   PT/INR No results for input(s): LABPROT, INR in the last 72 hours. ABG  Recent Labs  09/01/14 1851  HCO3 20.8    Studies/Results: No results found.  Anti-infectives: Anti-infectives    Start     Dose/Rate Route Frequency Ordered Stop   09/02/14 0200  piperacillin-tazobactam (ZOSYN) IVPB 2.25 g     2.25 g100 mL/hr over 30 Minutes Intravenous Every 8 hours 09/01/14 1842     09/01/14 1830  piperacillin-tazobactam (ZOSYN) IVPB 2.25 g     2.25 g100 mL/hr over 30 Minutes Intravenous NOW 09/01/14 1824 09/01/14 1930   09/01/14 1829  vancomycin (VANCOCIN) 2,500 mg in sodium chloride 0.9 % 250 mL IVPB  Status:  Discontinued     2,500 mg250 mL/hr over 60  Minutes Intravenous Every Dialysis 09/01/14 1829 09/02/14 0855   09/01/14 1815  piperacillin-tazobactam (ZOSYN) IVPB 3.375 g  Status:  Discontinued     3.375 g100 mL/hr over 30 Minutes Intravenous  Once 09/01/14 1803 09/01/14 1824   09/01/14 1815  vancomycin (VANCOCIN) IVPB 1000 mg/200 mL premix  Status:  Discontinued     1,000 mg200 mL/hr over 60 Minutes Intravenous  Once 09/01/14 1803 09/01/14 1830      Assessment/Plan: s/p  Check NGT output later today, if still low, clamp NGT and start clear liquids.  To get temporary dialysis catheter today.  LOS: 3 days   Jonathon Bailey. Jonathon Bailiff, MD, FACS [phone] [facility] Surgery 09/04/2014

## 2014-09-05 ENCOUNTER — Encounter (HOSPITAL_COMMUNITY)
Admission: EM | Disposition: A | Discharge status: Home / Self Care | Payer: Self-pay | Source: Home / Self Care | Attending: Family Medicine

## 2014-09-05 ENCOUNTER — Encounter (HOSPITAL_COMMUNITY): Payer: Self-pay | Admitting: Gastroenterology

## 2014-09-05 DIAGNOSIS — K922 Gastrointestinal hemorrhage, unspecified: Secondary | ICD-10-CM | POA: Insufficient documentation

## 2014-09-05 HISTORY — PX: FLEXIBLE SIGMOIDOSCOPY: SHX5431

## 2014-09-05 LAB — GLUCOSE, CAPILLARY
GLUCOSE-CAPILLARY: 113 mg/dL — AB (ref 70–99)
Glucose-Capillary: 112 mg/dL — ABNORMAL HIGH (ref 70–99)
Glucose-Capillary: 110 mg/dL — ABNORMAL HIGH (ref 70–99)
Glucose-Capillary: 130 mg/dL — ABNORMAL HIGH (ref 70–99)
Glucose-Capillary: 209 mg/dL — ABNORMAL HIGH (ref 70–99)
Glucose-Capillary: 211 mg/dL — ABNORMAL HIGH (ref 70–99)

## 2014-09-05 LAB — TYPE AND SCREEN
ABO/RH(D): O POS
ANTIBODY SCREEN: NEGATIVE
Unit division: 0
Unit division: 0
Unit division: 0

## 2014-09-05 LAB — CBC
HCT: 25.2 % — ABNORMAL LOW (ref 39.0–52.0)
HEMOGLOBIN: 8 g/dL — AB (ref 13.0–17.0)
MCH: 29.5 pg (ref 26.0–34.0)
MCHC: 31.7 g/dL (ref 30.0–36.0)
MCV: 93 fL (ref 78.0–100.0)
Platelets: 252 10*3/uL (ref 150–400)
RBC: 2.71 MIL/uL — ABNORMAL LOW (ref 4.22–5.81)
RDW: 15.6 % — ABNORMAL HIGH (ref 11.5–15.5)
WBC: 12.5 10*3/uL — ABNORMAL HIGH (ref 4.0–10.5)

## 2014-09-05 LAB — RENAL FUNCTION PANEL
ANION GAP: 23 — AB (ref 5–15)
Albumin: 2.4 g/dL — ABNORMAL LOW (ref 3.5–5.2)
BUN: 58 mg/dL — ABNORMAL HIGH (ref 6–23)
CALCIUM: 8.8 mg/dL (ref 8.4–10.5)
CO2: 21 meq/L (ref 19–32)
Chloride: 99 mEq/L (ref 96–112)
Creatinine, Ser: 7.92 mg/dL — ABNORMAL HIGH (ref 0.50–1.35)
GFR calc non Af Amer: 7 mL/min — ABNORMAL LOW (ref >90)
GFR, EST AFRICAN AMERICAN: 8 mL/min — AB (ref >90)
Glucose, Bld: 115 mg/dL — ABNORMAL HIGH (ref 70–99)
POTASSIUM: 3.9 meq/L (ref 3.7–5.3)
Phosphorus: 5.8 mg/dL — ABNORMAL HIGH (ref 2.3–4.6)
SODIUM: 143 meq/L (ref 137–147)

## 2014-09-05 SURGERY — SIGMOIDOSCOPY, FLEXIBLE

## 2014-09-05 MED ORDER — CALCITRIOL 0.25 MCG PO CAPS
0.2500 ug | ORAL_CAPSULE | Freq: Every day | ORAL | Status: DC
  Administered 2014-09-05 – 2014-09-10 (×5): 0.25 ug via ORAL
  Filled 2014-09-05 (×6): qty 1

## 2014-09-05 MED ORDER — ATORVASTATIN CALCIUM 40 MG PO TABS
40.0000 mg | ORAL_TABLET | Freq: Every day | ORAL | Status: DC
  Administered 2014-09-05 – 2014-09-09 (×5): 40 mg via ORAL
  Filled 2014-09-05 (×6): qty 1

## 2014-09-05 MED ORDER — SODIUM CHLORIDE 0.9 % IV SOLN
INTRAVENOUS | Status: DC
  Administered 2014-09-05: 20 mL via INTRAVENOUS

## 2014-09-05 MED ORDER — LIDOCAINE-PRILOCAINE 2.5-2.5 % EX CREA
1.0000 | TOPICAL_CREAM | CUTANEOUS | Status: DC | PRN
Start: 2014-09-05 — End: 2014-09-06
  Filled 2014-09-05: qty 5

## 2014-09-05 MED ORDER — PENTAFLUOROPROP-TETRAFLUOROETH EX AERO: 1.0000 "application " | INHALATION_SPRAY | CUTANEOUS | Status: DC | PRN

## 2014-09-05 MED ORDER — HEPARIN SODIUM (PORCINE) 1000 UNIT/ML DIALYSIS: 20.0000 [IU]/kg | INTRAMUSCULAR | Status: DC | PRN

## 2014-09-05 MED ORDER — LIDOCAINE HCL (PF) 1 % IJ SOLN: 5.0000 mL | INTRAMUSCULAR | Status: DC | PRN

## 2014-09-05 MED ORDER — CARVEDILOL 25 MG PO TABS
37.5000 mg | ORAL_TABLET | Freq: Two times a day (BID) | ORAL | Status: DC
  Administered 2014-09-05 – 2014-09-09 (×8): 37.5 mg via ORAL
  Filled 2014-09-05 (×12): qty 1

## 2014-09-05 MED ORDER — NEPRO/CARBSTEADY PO LIQD
237.0000 mL | ORAL | Status: DC | PRN
  Filled 2014-09-05: qty 237

## 2014-09-05 MED ORDER — LABETALOL HCL 5 MG/ML IV SOLN
10.0000 mg | Freq: Once | INTRAVENOUS | Status: AC
  Administered 2014-09-05: 10 mg via INTRAVENOUS
  Filled 2014-09-05: qty 4

## 2014-09-05 MED ORDER — POLYETHYLENE GLYCOL 3350 17 G PO PACK
17.0000 g | PACK | Freq: Every day | ORAL | Status: DC
  Administered 2014-09-05: 17 g via ORAL
  Filled 2014-09-05 (×3): qty 1

## 2014-09-05 MED ORDER — INSULIN ASPART 100 UNIT/ML ~~LOC~~ SOLN
0.0000 [IU] | Freq: Three times a day (TID) | SUBCUTANEOUS | Status: DC
  Administered 2014-09-05: 3 [IU] via SUBCUTANEOUS
  Administered 2014-09-07: 5 [IU] via SUBCUTANEOUS
  Administered 2014-09-07 (×2): 2 [IU] via SUBCUTANEOUS
  Administered 2014-09-08 (×2): 1 [IU] via SUBCUTANEOUS
  Administered 2014-09-09 (×2): 2 [IU] via SUBCUTANEOUS
  Administered 2014-09-10: 1 [IU] via SUBCUTANEOUS

## 2014-09-05 MED ORDER — ALTEPLASE 2 MG IJ SOLR
2.0000 mg | Freq: Once | INTRAMUSCULAR | Status: AC | PRN
  Filled 2014-09-05: qty 2

## 2014-09-05 MED ORDER — LANTHANUM CARBONATE 500 MG PO CHEW
1000.0000 mg | CHEWABLE_TABLET | Freq: Three times a day (TID) | ORAL | Status: DC
  Administered 2014-09-05 – 2014-09-10 (×11): 1000 mg via ORAL
  Filled 2014-09-05 (×18): qty 2

## 2014-09-05 MED ORDER — INSULIN ASPART 100 UNIT/ML ~~LOC~~ SOLN
3.0000 [IU] | Freq: Three times a day (TID) | SUBCUTANEOUS | Status: DC
  Administered 2014-09-05 – 2014-09-10 (×9): 3 [IU] via SUBCUTANEOUS

## 2014-09-05 MED ORDER — HYDRALAZINE HCL 50 MG PO TABS
50.0000 mg | ORAL_TABLET | Freq: Three times a day (TID) | ORAL | Status: DC
  Administered 2014-09-05 – 2014-09-10 (×12): 50 mg via ORAL
  Filled 2014-09-05 (×18): qty 1

## 2014-09-05 MED ORDER — INSULIN DETEMIR 100 UNIT/ML ~~LOC~~ SOLN
35.0000 [IU] | Freq: Two times a day (BID) | SUBCUTANEOUS | Status: DC
  Administered 2014-09-05: 35 [IU] via SUBCUTANEOUS
  Filled 2014-09-05 (×3): qty 0.35

## 2014-09-05 MED ORDER — HEPARIN SODIUM (PORCINE) 1000 UNIT/ML DIALYSIS: 1000.0000 [IU] | INTRAMUSCULAR | Status: DC | PRN

## 2014-09-05 MED ORDER — SODIUM CHLORIDE 0.9 % IV SOLN: 100.0000 mL | INTRAVENOUS | Status: DC | PRN

## 2014-09-05 MED ORDER — HYDROCORTISONE 2.5 % RE CREA
TOPICAL_CREAM | Freq: Four times a day (QID) | RECTAL | Status: DC
  Administered 2014-09-05: 1 via TOPICAL
  Administered 2014-09-06: 22:00:00 via TOPICAL
  Administered 2014-09-06: 1 via TOPICAL
  Administered 2014-09-07 – 2014-09-10 (×10): via TOPICAL
  Filled 2014-09-05: qty 28.35

## 2014-09-05 NOTE — Progress Notes (Signed)
Patient's flexible sigmoidoscopy showed an excoriated hemorrhoid (see dictated procedure note for details).  I have discussed the findings with Dr. Gevena Cotton, who may be able to do something from the surgical perspective, and have also ordered topical ointment.   I will sign off at this time, but please call us if we can be of further assistance with this patient.  Cleotis Nipper, M.D. [phone]

## 2014-09-05 NOTE — Op Note (Signed)
[facility] [place], [address]   FLEXIBLE SIGMOIDOSCOPY PROCEDURE REPORT  PATIENT: Jonathon Bailey, Jonathon Bailey  MR#: [id number] BIRTHDATE: 09/19/1956 , 58  yrs. old GENDER: male ENDOSCOPIST: Ronald Lobo, MD REFERRED BY: family practice teaching service PROCEDURE DATE:  09/05/2014 PROCEDURE:   flexible sigmoidoscopy ASA CLASS:   IV INDICATIONS: rectal bleeding and perianal pain in a patient with antecedent constipation and fecal impaction prompting digital self disimpaction MEDICATIONS: none  DESCRIPTION OF PROCEDURE:   After the risks benefits and alternatives of the procedure were thoroughly explained, informed consent was obtained.     The [id number]  endoscope was introduced through the anus  and advanced to approximately 30 cm.      , The exam was Without limitations.    The quality of the prep was adequate      .  The instrument was then slowly withdrawn as the mucosa was fully examined.       the patient was brought from the intensive care unit to the [facility] cone endoscopy unit. No prep was administered. No sedation was administered. Time out was performed.  Perianal exam showed a fairly large exterior hemorrhoid which appeared somewhat superficially necrotic and had what appeared to be a stigmata of hemorrhage on it. It was moderately tender but did not appear to be thrombosed. It was not clear whether this was a prolapsed internal hemorrhoid, an external hemorrhoid, or a mixed hemorrhoid. Anal sphincter tone was somewhat increased.  I did not get an exam of the prostate gland excessive patient discomfort.  The Pentax adult upper endoscope was used as a sigmoidoscope, to minimize patient discomfort compared to using a larger caliber scope such as a colonoscope. This was advanced to approximately 30 cm, without any difficulty. This procedure was intentionally done unprepped, in part, so I could see the distribution of  blood (if any) within the rectosigmoid lumen. As consequence, there was a film of stool coating much of the rectosigmoid mucosa, which may have obscured small or focal abnormalities.  There was no accumulation of any solid stool in the rectosigmoid lumen.  With that limitation, this was an unremarkable exam except for the external hemorrhoid noted above. I rinsed the rectal mucosa fairly thoroughly and did not see any evidence of a stercoral ulceration. There was no evidence of any large polyps or masses, nor if any mucosal abnormalities such as proctitis or colitis. I did not see any diverticular disease, and retroflexion in the rectum, interestingly, did not disclose significant internal hemorrhoids.         retroflexion was therefore essentially normal.         The scope was then withdrawn from the patient and the procedure terminated.  No biopsies were obtained.  COMPLICATIONS: There were no immediate complications.  ENDOSCOPIC IMPRESSION: 1. exterior hemorrhoid, inflamed, possibly necrotic, probably accounting for the patient's perianal discomfort. 2. No fecal obstipation, at least distally 3. Bleeding is presumed to have been of hemorrhoidal origin, based on absence of abnormalities in the rectosigmoid lumen  RECOMMENDATIONS: 1. Topical therapy for the hemorrhoids. Unfortunately, this debilitated patient awaiting hemodialysis treatments is probably not a good candidate for sitz baths 2. I have discussed the findings with Dr. Gevena Cotton of general surgery   REPEAT EXAM:  eSigned:  Ronald Lobo, MD 09/05/2014 11:16 AM   CC:  PATIENT NAME:  Jonathon Bailey, Jonathon Bailey MR#: [id number]

## 2014-09-05 NOTE — Progress Notes (Signed)
Pt's SBP still trends up.  Too early to give prn med.  Notified md.  Pt  ST to 130's then back to ST 100's.  Started NS at 20.  Will continue to monitor. NG tube to low interm. Suction.  Saunders Revel T

## 2014-09-05 NOTE — Progress Notes (Signed)
Notified Md pt had 5 beat VT. Pt asymptomatic.   New orders received. Will continue to monitor. [name]

## 2014-09-05 NOTE — Progress Notes (Signed)
Assessment/Plan: 1 PD associated peritonitis with enterobacter cloacae, prob fecal origin(recent self disimpaction) 2 ESRD, now with HD catheter, s/p treatment yesterday  Plan:   The PD catheter will need to be removed by surgery, and I spoke with Dr. Rosendo Gros who will see about potential removal Monday.  Make NPO after MN.  HD again in AM  Subjective: Interval History: 3 liters off with HD yesterday after HD catheter placed  Objective: Vital signs in last 24 hours: Temp:  [98.6 F (37 C)-99.9 F (37.7 C)] 98.6 F (37 C) (12/13 1200) Pulse Rate:  [91-109] 99 (12/13 1200) Resp:  [13-24] 15 (12/13 1200) BP: (127-194)/(64-134) 194/76 mmHg (12/13 1200) SpO2:  [91 %-99 %] 96 % (12/13 1200) Weight:  [125.5 kg (276 lb 10.8 oz)-131 kg (288 lb 12.8 oz)] 125.5 kg (276 lb 10.8 oz) (12/13 0320) Weight change:   Intake/Output from previous day: 12/12 0701 - 12/13 0700 In: 850.3 [I.V.:465.3; Blood:335; IV Piggyback:50] Out: [id number] [Urine:250; Emesis/NG output:670] Intake/Output this shift: Total I/O In: 40 [I.V.:40] Out: -   General appearance: alert and cooperative GI: protuberant, distended, difusse tenderness  Lab Results:  Recent Labs  09/04/14 2132 09/05/14 0258  WBC 12.0* 12.5*  HGB 8.1* 8.0*  HCT 25.4* 25.2*  PLT 242 252   BMET:  Recent Labs  09/04/14 1550 09/05/14 0540  NA 142 143  K 3.9 3.9  CL 98 99  CO2 21 21  GLUCOSE 146* 115*  BUN 97* 58*  CREATININE 11.23* 7.92*  CALCIUM 8.8 8.8   No results for input(s): PTH in the last 72 hours. Iron Studies: No results for input(s): IRON, TIBC, TRANSFERRIN, FERRITIN in the last 72 hours. Studies/Results: Ir Fluoro Guide Cv Line Right  09/04/2014   CLINICAL DATA:  Renal disease, needs dialysis. Poorly functioning PD catheter.  EXAM: TUNNELED HEMODIALYSIS CATHETER PLACEMENT WITH ULTRASOUND AND FLUOROSCOPIC GUIDANCE  TECHNIQUE: The procedure, risks, benefits, and alternatives were explained to the patient.  Questions regarding the procedure were encouraged and answered. The patient understands and consents to the procedure. As antibiotic prophylaxis, cefazolin was ordered pre-procedure and administered intravenously within one hour of incision.Patency of the right IJ vein was confirmed with ultrasound with image documentation. An appropriate skin site was determined. Region was prepped using maximum barrier technique including cap and mask, sterile gown, sterile gloves, large sterile sheet, and Chlorhexidine as cutaneous antisepsis. The region was infiltrated locally with 1% lidocaine.  Intravenous Fentanyl and Versed were administered as conscious sedation during continuous cardiorespiratory monitoring by the radiology RN, with a total moderate sedation time of 13 minutes.  Under real-time ultrasound guidance, the right IJ vein was accessed with a 21 gauge micropuncture needle; the needle tip within the vein was confirmed with ultrasound image documentation. Needle exchanged over the 018 guidewire for transitional dilator, which allowed advancement of a Benson wire into the IVC. Over this, an MPA catheter was advanced. A Hemosplit 19 hemodialysis catheter was tunneled from the right anterior chest wall approach to the right IJ dermatotomy site. The MPA catheter was exchanged over an Amplatz wire for serial vascular dilators which allow placement of a peel-away sheath, through which the catheter was advanced under intermittent fluoroscopy, positioned with its tips in the proximal and midright atrium. Spot chest radiograph confirms good catheter position. No pneumothorax. Catheter was flushed and primed per protocol. Catheter secured externally with O Prolene sutures. The right IJ dermatotomy site was closed with Dermabond.  COMPLICATIONS: COMPLICATIONS None immediate  FLUOROSCOPY TIME:  18 seconds  COMPARISON:  None  IMPRESSION: 1. Technically successful placement of tunneled right IJ hemodialysis catheter with  ultrasound and fluoroscopic guidance. Ready for routine use.   Electronically Signed   By: Arne Cleveland M.D.   On: 09/04/2014 12:05   Ir US Guide Vasc Access Right  09/04/2014   CLINICAL DATA:  Renal disease, needs dialysis. Poorly functioning PD catheter.  EXAM: TUNNELED HEMODIALYSIS CATHETER PLACEMENT WITH ULTRASOUND AND FLUOROSCOPIC GUIDANCE  TECHNIQUE: The procedure, risks, benefits, and alternatives were explained to the patient. Questions regarding the procedure were encouraged and answered. The patient understands and consents to the procedure. As antibiotic prophylaxis, cefazolin was ordered pre-procedure and administered intravenously within one hour of incision.Patency of the right IJ vein was confirmed with ultrasound with image documentation. An appropriate skin site was determined. Region was prepped using maximum barrier technique including cap and mask, sterile gown, sterile gloves, large sterile sheet, and Chlorhexidine as cutaneous antisepsis. The region was infiltrated locally with 1% lidocaine.  Intravenous Fentanyl and Versed were administered as conscious sedation during continuous cardiorespiratory monitoring by the radiology RN, with a total moderate sedation time of 13 minutes.  Under real-time ultrasound guidance, the right IJ vein was accessed with a 21 gauge micropuncture needle; the needle tip within the vein was confirmed with ultrasound image documentation. Needle exchanged over the 018 guidewire for transitional dilator, which allowed advancement of a Benson wire into the IVC. Over this, an MPA catheter was advanced. A Hemosplit 19 hemodialysis catheter was tunneled from the right anterior chest wall approach to the right IJ dermatotomy site. The MPA catheter was exchanged over an Amplatz wire for serial vascular dilators which allow placement of a peel-away sheath, through which the catheter was advanced under intermittent fluoroscopy, positioned with its tips in the proximal  and midright atrium. Spot chest radiograph confirms good catheter position. No pneumothorax. Catheter was flushed and primed per protocol. Catheter secured externally with O Prolene sutures. The right IJ dermatotomy site was closed with Dermabond.  COMPLICATIONS: COMPLICATIONS None immediate  FLUOROSCOPY TIME:  18 seconds  COMPARISON:  None  IMPRESSION: 1. Technically successful placement of tunneled right IJ hemodialysis catheter with ultrasound and fluoroscopic guidance. Ready for routine use.   Electronically Signed   By: Arne Cleveland M.D.   On: 09/04/2014 12:05   Scheduled: . [MAR Hold] sodium chloride   Intravenous Once  . antiseptic oral rinse  7 mL Mouth Rinse BID  . cefTAZidime (FORTAZ)  IV  2 g Intravenous Q24H  . darbepoetin (ARANESP) injection - NON-DIALYSIS  200 mcg Subcutaneous Q Thu-1800  . dialysis solution for [facility])   Peritoneal Dialysis UD  . fluticasone  2 spray Each Nare Daily  . hydrocortisone-pramoxine  1 applicator Rectal BID  . insulin aspart  0-9 Units Subcutaneous Q4H  . sodium chloride  3 mL Intravenous Q12H  . sodium chloride  3 mL Intravenous Q12H  . tiotropium  18 mcg Inhalation Daily     LOS: 4 days   Lucca Greggs C 09/05/2014,12:45 PM

## 2014-09-05 NOTE — Progress Notes (Signed)
Family Medicine Teaching Service Daily Progress Note Intern Pager: [phone]  Patient name: Jonathon Bailey Medical record number: [id number] Date of birth: [birth year] Age: 58 y.o. Gender: male  Primary Care Provider: Thersa Salt, DO Consultants: Nephrology, Surgery Code Status: FULL (discussed on admission)  Pt Overview and Major Events to Date:  09/01/14: Patient with FOBT+ 09/02/14: NGT placed for decompression 09/03/14: Likely to OR today 09/05/14: 5 beat Vtach overnt, patient asx. Flex sigmoid today. Patient self removed NGT this am.  Assessment and Plan: Jonathon Bailey is a 58 y.o. male presenting with generalized abdominal pain . PMH is significant for ESRD with peritoneal dialysis, T2DM, CAD, HTN, Chronic systolic and diastolic HF, h/o CVA, COPD  Abdominal pain: Peritoneal fluid cloudy and fever to 102F very suspicious for peritonitis. Abdominal xray with moderate stool burden but no acute findings. LA: 1.82>1.06. hgb 6.2 on admission (baseline 7-8). Pt meets sepsis criteria with source of infection, tachycardia, tachypnea, and fever.  CT abdomen pelvis with small amount of free air. Cytology with leukocytosis and intracellular organisms  -Continue Fortaz -c/s surgery, appreciate recs: Patient removed NGT this am.  Will continue w/ NPO until PD cath removed -c/s to renal, appreciate recs: HD cath placed, rec removal of PD cath per surgery -BCx NGTD -f/u cytology peritoneal fluid, r/o malignancy -monitor vitals closely  -keep on telemetry -Dilaudid 0.5mg  q2 PRN pain  Anemia: Baseline Hgb 9-10, on admission Hgb of 6.2. FOBT+, transfused 1 unit pRBCs 09/02/14. Post transfusion hgb 7.1. Hgb today 8.0. Potentially lower than baseline on admission due to possible GI bleed. Had colonoscopy in 2009 with no abnormalities noted.  -Continue Aranesp -consider transfusing additional unit with HD -monitor hgb closely -c/s GI, appreciate recs: Flex sigmoidoscopy today.  Start Miralax  once PO. -Continue proctofoam -hold home plavix for now, restart when able  ESRD: Receives daily peritoneal HD. Sees Dr Elroy Channel. Missed Sun and Tues sessions. Cr: 7.92 today (baseline 10-11). Slightly acidotic on VBG pH 7.316. S/p 1 amp bicarb -HD access placed yesterday & pt received HD, needs PD cath removed by surgery -Hold home fosrenol as patient is NPO, restart as soon as possible -holding garamycin   Tachycardia: Stable. HR 100's. BP high this morning 170/107. EKG: RBBB, LPFB. Troponin negative x1. Patient NPO.  Received 1 dose of Labetalol overnight.  -management as above -Hold Coreg while NPO, IV metoprolol if tachy>120.  Chronic systolic and diastolic HF: ECHO 123456: EF 40-45%. -holding BB -KVO fluids  HTN: BP elevated overnight.  AM BP 170/107. Received Hydralazine IV this am. -Hold Coreg -Consider IV hydralazine PRN SBP> 160 or IV metoprolol for BP and HR control while NPO.  CAD & H/o CVA.: Patient without signs of ischemia on exam. Neurologically in tact.  -hold home Lipitor, Coreg - restart once taking PO -hold plavix at this time due to concern for GI bleed  T2DM: Last A1c: 7.4. On home levemir 75 u BID -Hold Levemir as pt NPO, resume at decreased dose when PO again -SSI, renal  COPD: -Continue Albuterol PRN & Spiriva daily  FEN/GI: KVO, NPO PPx: SCDs (no heparin d/t GI bleed)  Disposition: Dispo pending surgery and renal recs  Subjective:  Patient reports that "the NGT slipped out this morning".  He continues to endorse abdominal pain.  Denies any more bloody BMs this morning.  Denies N/V, CP, SOB, pain anywhere else.  Objective: Temp:  [98 F (36.7 C)-99.9 F (37.7 C)] 99.5 F (37.5 C) (12/13 0320) Pulse Rate:  [91-109] 109 (  12/12 2305) Resp:  [10-24] 17 (12/13 0440) BP: (118-175)/(64-101) 158/64 mmHg (12/13 0400) SpO2:  [90 %-99 %] 94 % (12/13 0440) Weight:  [276 lb 10.8 oz (125.5 kg)-288 lb 12.8 oz (131 kg)] 276 lb 10.8 oz (125.5 kg) (12/13  0320) Physical Exam: General: awake, alert, lying in bed, uncomfortable but NAD HEENT: [place]/AT, MMM Cardiovascular: tachycardic but regular rhythm Respiratory: CTAB, no increased WOB Abdomen: obese, markedly distended, TTP throughout, bowel sounds difficult to ausculate Extremities: WWP, no edema, clubbing or cyanosis.  Laboratory:  Recent Labs Lab 09/04/14 0905 09/04/14 2132 09/05/14 0258  WBC 11.3* 12.0* 12.5*  HGB 7.1* 8.1* 8.0*  HCT 22.1* 25.4* 25.2*  PLT 228 242 252    Recent Labs Lab 09/01/14 1808  09/03/14 0730 09/04/14 1550 09/05/14 0540  NA 138  < > 142 142 143  K 4.2  < > 4.1 3.9 3.9  CL 97  < > 99 98 99  CO2 16*  < > 21 21 21   BUN 94*  < > 98* 97* 58*  CREATININE 11.51*  < > 11.59* 11.23* 7.92*  CALCIUM 9.2  < > 9.0 8.8 8.8  PROT 7.5  --   --   --   --   BILITOT 0.4  --   --   --   --   ALKPHOS 65  --   --   --   --   ALT 13  --   --   --   --   AST 10  --   --   --   --   GLUCOSE 292*  < > 124* 146* 115*  < > = values in this interval not displayed.  Imaging/Diagnostic Tests:  Ir Fluoro Guide Cv Line Right  09/04/2014   CLINICAL DATA:  Renal disease, needs dialysis. Poorly functioning PD catheter.  EXAM: TUNNELED HEMODIALYSIS CATHETER PLACEMENT WITH ULTRASOUND AND FLUOROSCOPIC GUIDANCE  TECHNIQUE: The procedure, risks, benefits, and alternatives were explained to the patient. Questions regarding the procedure were encouraged and answered. The patient understands and consents to the procedure. As antibiotic prophylaxis, cefazolin was ordered pre-procedure and administered intravenously within one hour of incision.Patency of the right IJ vein was confirmed with ultrasound with image documentation. An appropriate skin site was determined. Region was prepped using maximum barrier technique including cap and mask, sterile gown, sterile gloves, large sterile sheet, and Chlorhexidine as cutaneous antisepsis. The region was infiltrated locally with 1% lidocaine.   Intravenous Fentanyl and Versed were administered as conscious sedation during continuous cardiorespiratory monitoring by the radiology RN, with a total moderate sedation time of 13 minutes.  Under real-time ultrasound guidance, the right IJ vein was accessed with a 21 gauge micropuncture needle; the needle tip within the vein was confirmed with ultrasound image documentation. Needle exchanged over the 018 guidewire for transitional dilator, which allowed advancement of a Benson wire into the IVC. Over this, an MPA catheter was advanced. A Hemosplit 19 hemodialysis catheter was tunneled from the right anterior chest wall approach to the right IJ dermatotomy site. The MPA catheter was exchanged over an Amplatz wire for serial vascular dilators which allow placement of a peel-away sheath, through which the catheter was advanced under intermittent fluoroscopy, positioned with its tips in the proximal and midright atrium. Spot chest radiograph confirms good catheter position. No pneumothorax. Catheter was flushed and primed per protocol. Catheter secured externally with O Prolene sutures. The right IJ dermatotomy site was closed with Dermabond.  COMPLICATIONS: COMPLICATIONS None immediate  FLUOROSCOPY TIME:  18 seconds  COMPARISON:  None  IMPRESSION: 1. Technically successful placement of tunneled right IJ hemodialysis catheter with ultrasound and fluoroscopic guidance. Ready for routine use.   Electronically Signed   By: Arne Cleveland M.D.   On: 09/04/2014 12:05   Ir US Guide Vasc Access Right  09/04/2014   CLINICAL DATA:  Renal disease, needs dialysis. Poorly functioning PD catheter.  EXAM: TUNNELED HEMODIALYSIS CATHETER PLACEMENT WITH ULTRASOUND AND FLUOROSCOPIC GUIDANCE  TECHNIQUE: The procedure, risks, benefits, and alternatives were explained to the patient. Questions regarding the procedure were encouraged and answered. The patient understands and consents to the procedure. As antibiotic prophylaxis,  cefazolin was ordered pre-procedure and administered intravenously within one hour of incision.Patency of the right IJ vein was confirmed with ultrasound with image documentation. An appropriate skin site was determined. Region was prepped using maximum barrier technique including cap and mask, sterile gown, sterile gloves, large sterile sheet, and Chlorhexidine as cutaneous antisepsis. The region was infiltrated locally with 1% lidocaine.  Intravenous Fentanyl and Versed were administered as conscious sedation during continuous cardiorespiratory monitoring by the radiology RN, with a total moderate sedation time of 13 minutes.  Under real-time ultrasound guidance, the right IJ vein was accessed with a 21 gauge micropuncture needle; the needle tip within the vein was confirmed with ultrasound image documentation. Needle exchanged over the 018 guidewire for transitional dilator, which allowed advancement of a Benson wire into the IVC. Over this, an MPA catheter was advanced. A Hemosplit 19 hemodialysis catheter was tunneled from the right anterior chest wall approach to the right IJ dermatotomy site. The MPA catheter was exchanged over an Amplatz wire for serial vascular dilators which allow placement of a peel-away sheath, through which the catheter was advanced under intermittent fluoroscopy, positioned with its tips in the proximal and midright atrium. Spot chest radiograph confirms good catheter position. No pneumothorax. Catheter was flushed and primed per protocol. Catheter secured externally with O Prolene sutures. The right IJ dermatotomy site was closed with Dermabond.  COMPLICATIONS: COMPLICATIONS None immediate  FLUOROSCOPY TIME:  18 seconds  COMPARISON:  None  IMPRESSION: 1. Technically successful placement of tunneled right IJ hemodialysis catheter with ultrasound and fluoroscopic guidance. Ready for routine use.   Electronically Signed   By: Arne Cleveland M.D.   On: 09/04/2014 12:05    Janora Norlander, DO 09/05/2014, 7:13 AM PGY-1, [place] Intern pager: [phone], text pages welcome

## 2014-09-05 NOTE — Progress Notes (Signed)
Day of Surgery  Subjective: Pt on the way to get flex sig for BRBPR abd pain better HD yesterday NGT pulled yesterday  Objective: Vital signs in last 24 hours: Temp:  [98 F (36.7 C)-99.9 F (37.7 C)] 99.2 F (37.3 C) (12/13 0800) Pulse Rate:  [91-109] 99 (12/13 1003) Resp:  [10-24] 14 (12/13 1003) BP: (118-175)/(64-107) 170/76 mmHg (12/13 1003) SpO2:  [90 %-99 %] 96 % (12/13 1003) Weight:  [276 lb 10.8 oz (125.5 kg)-288 lb 12.8 oz (131 kg)] 276 lb 10.8 oz (125.5 kg) (12/13 0320) Last BM Date: 09/05/14  Intake/Output from previous day: 12/12 0701 - 12/13 0700 In: 850.3 [I.V.:465.3; Blood:335; IV Piggyback:50] Out: [id number] [Urine:250; Emesis/NG output:670] Intake/Output this shift: Total I/O In: 40 [I.V.:40] Out: -   General appearance: alert and cooperative GI: soft, ttp x4Q, no rebound/guarding  Lab Results:   Recent Labs  09/04/14 2132 09/05/14 0258  WBC 12.0* 12.5*  HGB 8.1* 8.0*  HCT 25.4* 25.2*  PLT 242 252   BMET  Recent Labs  09/04/14 1550 09/05/14 0540  NA 142 143  K 3.9 3.9  CL 98 99  CO2 21 21  GLUCOSE 146* 115*  BUN 97* 58*  CREATININE 11.23* 7.92*  CALCIUM 8.8 8.8   PT/INR No results for input(s): LABPROT, INR in the last 72 hours. ABG No results for input(s): PHART, HCO3 in the last 72 hours.  Invalid input(s): PCO2, PO2  Studies/Results: Ir Fluoro Guide Cv Line Right  09/04/2014   CLINICAL DATA:  Renal disease, needs dialysis. Poorly functioning PD catheter.  EXAM: TUNNELED HEMODIALYSIS CATHETER PLACEMENT WITH ULTRASOUND AND FLUOROSCOPIC GUIDANCE  TECHNIQUE: The procedure, risks, benefits, and alternatives were explained to the patient. Questions regarding the procedure were encouraged and answered. The patient understands and consents to the procedure. As antibiotic prophylaxis, cefazolin was ordered pre-procedure and administered intravenously within one hour of incision.Patency of the right IJ vein was confirmed with ultrasound with  image documentation. An appropriate skin site was determined. Region was prepped using maximum barrier technique including cap and mask, sterile gown, sterile gloves, large sterile sheet, and Chlorhexidine as cutaneous antisepsis. The region was infiltrated locally with 1% lidocaine.  Intravenous Fentanyl and Versed were administered as conscious sedation during continuous cardiorespiratory monitoring by the radiology RN, with a total moderate sedation time of 13 minutes.  Under real-time ultrasound guidance, the right IJ vein was accessed with a 21 gauge micropuncture needle; the needle tip within the vein was confirmed with ultrasound image documentation. Needle exchanged over the 018 guidewire for transitional dilator, which allowed advancement of a Benson wire into the IVC. Over this, an MPA catheter was advanced. A Hemosplit 19 hemodialysis catheter was tunneled from the right anterior chest wall approach to the right IJ dermatotomy site. The MPA catheter was exchanged over an Amplatz wire for serial vascular dilators which allow placement of a peel-away sheath, through which the catheter was advanced under intermittent fluoroscopy, positioned with its tips in the proximal and midright atrium. Spot chest radiograph confirms good catheter position. No pneumothorax. Catheter was flushed and primed per protocol. Catheter secured externally with O Prolene sutures. The right IJ dermatotomy site was closed with Dermabond.  COMPLICATIONS: COMPLICATIONS None immediate  FLUOROSCOPY TIME:  18 seconds  COMPARISON:  None  IMPRESSION: 1. Technically successful placement of tunneled right IJ hemodialysis catheter with ultrasound and fluoroscopic guidance. Ready for routine use.   Electronically Signed   By: Arne Cleveland M.D.   On: 09/04/2014 12:05  Ir US Guide Vasc Access Right  09/04/2014   CLINICAL DATA:  Renal disease, needs dialysis. Poorly functioning PD catheter.  EXAM: TUNNELED HEMODIALYSIS CATHETER PLACEMENT  WITH ULTRASOUND AND FLUOROSCOPIC GUIDANCE  TECHNIQUE: The procedure, risks, benefits, and alternatives were explained to the patient. Questions regarding the procedure were encouraged and answered. The patient understands and consents to the procedure. As antibiotic prophylaxis, cefazolin was ordered pre-procedure and administered intravenously within one hour of incision.Patency of the right IJ vein was confirmed with ultrasound with image documentation. An appropriate skin site was determined. Region was prepped using maximum barrier technique including cap and mask, sterile gown, sterile gloves, large sterile sheet, and Chlorhexidine as cutaneous antisepsis. The region was infiltrated locally with 1% lidocaine.  Intravenous Fentanyl and Versed were administered as conscious sedation during continuous cardiorespiratory monitoring by the radiology RN, with a total moderate sedation time of 13 minutes.  Under real-time ultrasound guidance, the right IJ vein was accessed with a 21 gauge micropuncture needle; the needle tip within the vein was confirmed with ultrasound image documentation. Needle exchanged over the 018 guidewire for transitional dilator, which allowed advancement of a Benson wire into the IVC. Over this, an MPA catheter was advanced. A Hemosplit 19 hemodialysis catheter was tunneled from the right anterior chest wall approach to the right IJ dermatotomy site. The MPA catheter was exchanged over an Amplatz wire for serial vascular dilators which allow placement of a peel-away sheath, through which the catheter was advanced under intermittent fluoroscopy, positioned with its tips in the proximal and midright atrium. Spot chest radiograph confirms good catheter position. No pneumothorax. Catheter was flushed and primed per protocol. Catheter secured externally with O Prolene sutures. The right IJ dermatotomy site was closed with Dermabond.  COMPLICATIONS: COMPLICATIONS None immediate  FLUOROSCOPY TIME:   18 seconds  COMPARISON:  None  IMPRESSION: 1. Technically successful placement of tunneled right IJ hemodialysis catheter with ultrasound and fluoroscopic guidance. Ready for routine use.   Electronically Signed   By: Arne Cleveland M.D.   On: 09/04/2014 12:05    Anti-infectives: Anti-infectives    Start     Dose/Rate Route Frequency Ordered Stop   09/04/14 1030  ceFAZolin (ANCEF) IVPB 2 g/50 mL premix    Comments:  Hang ON CALL to xray today   2 g100 mL/hr over 30 Minutes Intravenous On call 09/04/14 0944 09/04/14 1141   09/04/14 1030  [MAR Hold]  cefTAZidime (FORTAZ) 2 g in dextrose 5 % 50 mL IVPB     (MAR Hold since 09/05/14 0841)   2 g100 mL/hr over 30 Minutes Intravenous Every 24 hours 09/04/14 0950     09/02/14 0200  piperacillin-tazobactam (ZOSYN) IVPB 2.25 g  Status:  Discontinued     2.25 g100 mL/hr over 30 Minutes Intravenous Every 8 hours 09/01/14 1842 09/04/14 0950   09/01/14 1830  piperacillin-tazobactam (ZOSYN) IVPB 2.25 g     2.25 g100 mL/hr over 30 Minutes Intravenous NOW 09/01/14 1824 09/01/14 1930   09/01/14 1829  vancomycin (VANCOCIN) 2,500 mg in sodium chloride 0.9 % 250 mL IVPB  Status:  Discontinued     2,500 mg250 mL/hr over 60 Minutes Intravenous Every Dialysis 09/01/14 1829 09/02/14 0855   09/01/14 1815  piperacillin-tazobactam (ZOSYN) IVPB 3.375 g  Status:  Discontinued     3.375 g100 mL/hr over 30 Minutes Intravenous  Once 09/01/14 1803 09/01/14 1824   09/01/14 1815  vancomycin (VANCOCIN) IVPB 1000 mg/200 mL premix  Status:  Discontinued     1,000  mg200 mL/hr over 60 Minutes Intravenous  Once 09/01/14 1803 09/01/14 1830      Assessment/Plan: CAPD catheter with peritonitis Will adv to FLD Await flex sig findings Will need removal of CAPD catheter  LOS: 4 days    Rosario Jacks., [facility] 09/05/2014

## 2014-09-06 ENCOUNTER — Inpatient Hospital Stay (HOSPITAL_COMMUNITY): Payer: Medicare Other | Admitting: Certified Registered"

## 2014-09-06 ENCOUNTER — Encounter (HOSPITAL_COMMUNITY)
Admission: EM | Disposition: A | Discharge status: Home / Self Care | Payer: Self-pay | Source: Home / Self Care | Attending: Family Medicine

## 2014-09-06 ENCOUNTER — Encounter (HOSPITAL_COMMUNITY): Payer: Self-pay | Admitting: Gastroenterology

## 2014-09-06 DIAGNOSIS — K658 Other peritonitis: Secondary | ICD-10-CM

## 2014-09-06 HISTORY — PX: INCISION AND DRAINAGE ABSCESS: SHX5864

## 2014-09-06 LAB — BASIC METABOLIC PANEL
Anion gap: 17 — ABNORMAL HIGH (ref 5–15)
BUN: 67 mg/dL — ABNORMAL HIGH (ref 6–23)
CHLORIDE: 97 meq/L (ref 96–112)
CO2: 24 meq/L (ref 19–32)
Calcium: 9.1 mg/dL (ref 8.4–10.5)
Creatinine, Ser: 8.7 mg/dL — ABNORMAL HIGH (ref 0.50–1.35)
GFR calc Af Amer: 7 mL/min — ABNORMAL LOW (ref >90)
GFR calc non Af Amer: 6 mL/min — ABNORMAL LOW (ref >90)
GLUCOSE: 123 mg/dL — AB (ref 70–99)
POTASSIUM: 3.9 meq/L (ref 3.7–5.3)
SODIUM: 138 meq/L (ref 137–147)

## 2014-09-06 LAB — GLUCOSE, CAPILLARY
GLUCOSE-CAPILLARY: 88 mg/dL (ref 70–99)
Glucose-Capillary: 102 mg/dL — ABNORMAL HIGH (ref 70–99)
Glucose-Capillary: 108 mg/dL — ABNORMAL HIGH (ref 70–99)
Glucose-Capillary: 133 mg/dL — ABNORMAL HIGH (ref 70–99)

## 2014-09-06 LAB — CBC
HEMATOCRIT: 24.9 % — AB (ref 39.0–52.0)
HEMOGLOBIN: 7.8 g/dL — AB (ref 13.0–17.0)
MCH: 29.2 pg (ref 26.0–34.0)
MCHC: 31.3 g/dL (ref 30.0–36.0)
MCV: 93.3 fL (ref 78.0–100.0)
PLATELETS: 244 10*3/uL (ref 150–400)
RBC: 2.67 MIL/uL — ABNORMAL LOW (ref 4.22–5.81)
RDW: 15.3 % (ref 11.5–15.5)
WBC: 11.9 10*3/uL — AB (ref 4.0–10.5)

## 2014-09-06 LAB — SURGICAL PCR SCREEN
MRSA, PCR: NEGATIVE
STAPHYLOCOCCUS AUREUS: NEGATIVE

## 2014-09-06 SURGERY — INCISION AND DRAINAGE, ABSCESS
Anesthesia: General

## 2014-09-06 MED ORDER — OXYCODONE HCL 5 MG PO TABS
5.0000 mg | ORAL_TABLET | Freq: Once | ORAL | Status: AC | PRN
  Administered 2014-09-06: 5 mg via ORAL

## 2014-09-06 MED ORDER — LIDOCAINE HCL (PF) 1 % IJ SOLN: 5.0000 mL | INTRAMUSCULAR | Status: DC | PRN

## 2014-09-06 MED ORDER — LIDOCAINE-PRILOCAINE 2.5-2.5 % EX CREA
1.0000 "application " | TOPICAL_CREAM | CUTANEOUS | Status: DC | PRN
  Filled 2014-09-06: qty 5

## 2014-09-06 MED ORDER — NEPRO/CARBSTEADY PO LIQD
237.0000 mL | ORAL | Status: DC | PRN
  Filled 2014-09-06: qty 237

## 2014-09-06 MED ORDER — HYDROMORPHONE HCL 1 MG/ML IJ SOLN
0.2500 mg | INTRAMUSCULAR | Status: DC | PRN
  Administered 2014-09-06 (×3): 0.5 mg via INTRAVENOUS

## 2014-09-06 MED ORDER — ALTEPLASE 2 MG IJ SOLR
2.0000 mg | Freq: Once | INTRAMUSCULAR | Status: AC | PRN
  Filled 2014-09-06: qty 2

## 2014-09-06 MED ORDER — OXYCODONE HCL 5 MG/5ML PO SOLN: 5.0000 mg | Freq: Once | ORAL | Status: AC | PRN

## 2014-09-06 MED ORDER — HYDROMORPHONE HCL 1 MG/ML IJ SOLN
INTRAMUSCULAR | Status: AC
  Filled 2014-09-06: qty 1

## 2014-09-06 MED ORDER — FENTANYL CITRATE 0.05 MG/ML IJ SOLN
INTRAMUSCULAR | Status: DC | PRN
  Administered 2014-09-06 (×3): 50 ug via INTRAVENOUS

## 2014-09-06 MED ORDER — SODIUM CHLORIDE 0.9 % IV SOLN
125.0000 mg | INTRAVENOUS | Status: DC
  Administered 2014-09-08: 125 mg via INTRAVENOUS
  Filled 2014-09-06 (×2): qty 10

## 2014-09-06 MED ORDER — INSULIN DETEMIR 100 UNIT/ML ~~LOC~~ SOLN
35.0000 [IU] | Freq: Two times a day (BID) | SUBCUTANEOUS | Status: DC
  Administered 2014-09-07 – 2014-09-10 (×7): 35 [IU] via SUBCUTANEOUS
  Filled 2014-09-06 (×8): qty 0.35

## 2014-09-06 MED ORDER — HEPARIN SODIUM (PORCINE) 1000 UNIT/ML DIALYSIS: 1000.0000 [IU] | INTRAMUSCULAR | Status: DC | PRN

## 2014-09-06 MED ORDER — LIDOCAINE HCL (CARDIAC) 20 MG/ML IV SOLN
INTRAVENOUS | Status: AC
  Filled 2014-09-06: qty 5

## 2014-09-06 MED ORDER — OXYCODONE HCL 5 MG PO TABS
ORAL_TABLET | ORAL | Status: AC
  Administered 2014-09-06: 5 mg via ORAL
  Filled 2014-09-06: qty 1

## 2014-09-06 MED ORDER — PROPOFOL 10 MG/ML IV BOLUS
INTRAVENOUS | Status: AC
  Filled 2014-09-06: qty 20

## 2014-09-06 MED ORDER — PROPOFOL 10 MG/ML IV BOLUS
INTRAVENOUS | Status: DC | PRN
  Administered 2014-09-06: 150 mg via INTRAVENOUS

## 2014-09-06 MED ORDER — SUCCINYLCHOLINE CHLORIDE 20 MG/ML IJ SOLN
INTRAMUSCULAR | Status: DC | PRN
  Administered 2014-09-06: 140 mg via INTRAVENOUS

## 2014-09-06 MED ORDER — SODIUM CHLORIDE 0.9 % IV SOLN: 100.0000 mL | INTRAVENOUS | Status: DC | PRN

## 2014-09-06 MED ORDER — ONDANSETRON HCL 4 MG/2ML IJ SOLN: 4.0000 mg | Freq: Once | INTRAMUSCULAR | Status: DC | PRN

## 2014-09-06 MED ORDER — MORPHINE SULFATE 2 MG/ML IJ SOLN: 1.0000 mg | INTRAMUSCULAR | Status: DC | PRN

## 2014-09-06 MED ORDER — HEPARIN SODIUM (PORCINE) 1000 UNIT/ML DIALYSIS: 20.0000 [IU]/kg | INTRAMUSCULAR | Status: DC | PRN

## 2014-09-06 MED ORDER — BUPIVACAINE-EPINEPHRINE (PF) 0.25% -1:200000 IJ SOLN
INTRAMUSCULAR | Status: AC
  Filled 2014-09-06: qty 30

## 2014-09-06 MED ORDER — FENTANYL CITRATE 0.05 MG/ML IJ SOLN
INTRAMUSCULAR | Status: AC
  Filled 2014-09-06: qty 5

## 2014-09-06 MED ORDER — LIDOCAINE HCL (CARDIAC) 20 MG/ML IV SOLN
INTRAVENOUS | Status: DC | PRN
  Administered 2014-09-06: 50 mg via INTRAVENOUS

## 2014-09-06 MED ORDER — ONDANSETRON HCL 4 MG/2ML IJ SOLN
INTRAMUSCULAR | Status: DC | PRN
  Administered 2014-09-06: 4 mg via INTRAVENOUS

## 2014-09-06 MED ORDER — PENTAFLUOROPROP-TETRAFLUOROETH EX AERO: 1.0000 "application " | INHALATION_SPRAY | CUTANEOUS | Status: DC | PRN

## 2014-09-06 MED ORDER — HYDROCODONE-ACETAMINOPHEN 5-325 MG PO TABS
1.0000 | ORAL_TABLET | ORAL | Status: DC | PRN
  Administered 2014-09-06 – 2014-09-07 (×2): 2 via ORAL
  Filled 2014-09-06 (×2): qty 2

## 2014-09-06 MED ORDER — BUPIVACAINE-EPINEPHRINE 0.25% -1:200000 IJ SOLN
INTRAMUSCULAR | Status: DC | PRN
  Administered 2014-09-06: 20 mL

## 2014-09-06 SURGICAL SUPPLY — 26 items
BNDG GAUZE ELAST 4 BULKY (GAUZE/BANDAGES/DRESSINGS) IMPLANT
CANISTER SUCTION 2500CC (MISCELLANEOUS) ×3 IMPLANT
COVER SURGICAL LIGHT HANDLE (MISCELLANEOUS) ×3 IMPLANT
DRAPE LAPAROSCOPIC ABDOMINAL (DRAPES) ×3 IMPLANT
DRAPE UTILITY XL STRL (DRAPES) ×6 IMPLANT
DRSG PAD ABDOMINAL 8X10 ST (GAUZE/BANDAGES/DRESSINGS) IMPLANT
ELECT CAUTERY BLADE 6.4 (BLADE) ×3 IMPLANT
ELECT REM PT RETURN 9FT ADLT (ELECTROSURGICAL) ×3
ELECTRODE REM PT RTRN 9FT ADLT (ELECTROSURGICAL) ×1 IMPLANT
GAUZE SPONGE 4X4 12PLY STRL (GAUZE/BANDAGES/DRESSINGS) IMPLANT
GLOVE SURG SIGNA 7.5 PF LTX (GLOVE) ×3 IMPLANT
GOWN STRL REUS W/ TWL LRG LVL3 (GOWN DISPOSABLE) ×1 IMPLANT
GOWN STRL REUS W/ TWL XL LVL3 (GOWN DISPOSABLE) ×1 IMPLANT
GOWN STRL REUS W/TWL LRG LVL3 (GOWN DISPOSABLE) ×3
GOWN STRL REUS W/TWL XL LVL3 (GOWN DISPOSABLE) ×3
KIT BASIN OR (CUSTOM PROCEDURE TRAY) ×3 IMPLANT
KIT ROOM TURNOVER OR (KITS) ×3 IMPLANT
NS IRRIG 1000ML POUR BTL (IV SOLUTION) ×3 IMPLANT
PACK GENERAL/GYN (CUSTOM PROCEDURE TRAY) ×3 IMPLANT
PAD ARMBOARD 7.5X6 YLW CONV (MISCELLANEOUS) ×6 IMPLANT
SPECIMEN JAR SMALL (MISCELLANEOUS) IMPLANT
SPONGE GAUZE 4X4 12PLY STER LF (GAUZE/BANDAGES/DRESSINGS) ×2 IMPLANT
SWAB COLLECTION DEVICE MRSA (MISCELLANEOUS) IMPLANT
TOWEL OR 17X24 6PK STRL BLUE (TOWEL DISPOSABLE) ×3 IMPLANT
TOWEL OR 17X26 10 PK STRL BLUE (TOWEL DISPOSABLE) ×3 IMPLANT
TUBE ANAEROBIC SPECIMEN COL (MISCELLANEOUS) IMPLANT

## 2014-09-06 NOTE — Anesthesia Preprocedure Evaluation (Signed)
Anesthesia Evaluation  Patient identified by MRN, date of birth, ID band Patient awake    Reviewed: Allergy & Precautions, H&P , NPO status , Patient's Chart, lab work & pertinent test results, reviewed documented beta blocker date and time   History of Anesthesia Complications (+) history of anesthetic complications  Airway Mallampati: II       Dental  (+) Teeth Intact, Dental Advisory Given   Pulmonary asthma , sleep apnea and Continuous Positive Airway Pressure Ventilation , COPDformer smoker,          Cardiovascular hypertension, + CAD and + Past MI     Neuro/Psych  Headaches, CVA    GI/Hepatic   Endo/Other  diabetes  Renal/GU Dialysis and ESRFRenal disease     Musculoskeletal  (+) Arthritis -,   Abdominal   Peds  Hematology   Anesthesia Other Findings   Reproductive/Obstetrics                             Anesthesia Physical Anesthesia Plan  ASA: III  Anesthesia Plan:    Post-op Pain Management:    Induction:   Airway Management Planned:   Additional Equipment:   Intra-op Plan:   Post-operative Plan:   Informed Consent:   Plan Discussed with:   Anesthesia Plan Comments:         Anesthesia Quick Evaluation

## 2014-09-06 NOTE — Progress Notes (Signed)
Patient ID: Jonathon Bailey, male   DOB: 03-16-1956, 58 y.o.   MRN: [id number]   PD cath removal recommended Patient currently in HD I discussed this with him including the risks He agrees to proceed later today in the OR

## 2014-09-06 NOTE — Anesthesia Postprocedure Evaluation (Signed)
  Anesthesia Post-op Note  Patient: Jonathon Bailey  Procedure(s) Performed: Procedure(s): REMOVAL OF PD CATH (N/A)  Patient Location: PACU  Anesthesia Type:General  Level of Consciousness: awake, alert  and oriented  Airway and Oxygen Therapy: Patient Spontanous Breathing and Patient connected to nasal cannula oxygen  Post-op Pain: mild  Post-op Assessment: Post-op Vital signs reviewed, Patient's Cardiovascular Status Stable, Respiratory Function Stable, Patent Airway, No signs of Nausea or vomiting and Pain level controlled  Post-op Vital Signs: stable  Last Vitals:  Filed Vitals:   09/06/14 1700  BP: 132/73  Pulse: 90  Temp: 36.6 C  Resp: 21    Complications: No apparent anesthesia complications

## 2014-09-06 NOTE — Anesthesia Procedure Notes (Signed)
Procedure Name: Intubation Date/Time: 09/06/2014 1:28 PM Performed by: Julian Reil Pre-anesthesia Checklist: Emergency Drugs available, Patient identified, Suction available and Patient being monitored Patient Re-evaluated:Patient Re-evaluated prior to inductionOxygen Delivery Method: Circle system utilized Preoxygenation: Pre-oxygenation with 100% oxygen Intubation Type: IV induction Ventilation: Mask ventilation without difficulty Laryngoscope Size: Mac and 4 Grade View: Grade II Tube type: Oral Tube size: 7.5 mm Number of attempts: 1 Airway Equipment and Method: Stylet Placement Confirmation: ETT inserted through vocal cords under direct vision,  positive ETCO2 and breath sounds checked- equal and bilateral Secured at: 23 cm Tube secured with: Tape Dental Injury: Teeth and Oropharynx as per pre-operative assessment

## 2014-09-06 NOTE — Procedures (Signed)
I was present at this dialysis session, have reviewed the session itself and made  appropriate changes  [name] MD (pgr) [phone]    [phone] 09/06/2014, 9:20 AM

## 2014-09-06 NOTE — Progress Notes (Signed)
[facility] Progress Note   Subjective: abd still hurts, hurts to walk , not eating much, npo now   Filed Vitals:   09/06/14 0743 09/06/14 0747 09/06/14 0752 09/06/14 0800  BP: 156/85 142/76 146/77 169/45  Pulse: 90 86 87 87  Temp: 98.3 F (36.8 C)     TempSrc: Oral     Resp: 20     Height:      Weight: 127 kg (279 lb 15.8 oz)     SpO2: 99%      Exam: Alert, no distress No jvd Chest clear bilat RRR no MRG Abd obese, diffusely tender, +rebound, soft, L abd PD cath site no drainage Neuro alert, ox 3, nf       Assessment: 1. PD associated peritonitis / E. Cloacae +cx - cont IV abx, for cath removal by Gen Surg, possibly today 2. ESRD on HD, s/p new TDC 3. Anemia Hb 7's, on max ESA, tsat 10% > start IV iron load 1gm on wed 4. MBD cont fosrenol, po vit D 5. HTN/vol - on hydral / coreg, wt's down, BP normal, no vol excess 6. DM on insulin   Plan - HD today, Gen Surg evaluating for cath removal    [name] MD  pager [phone]    cell [phone]  09/06/2014, 9:09 AM     Recent Labs Lab 09/02/14 0224  09/04/14 1550 09/05/14 0540 09/06/14 0305  NA 140  < > 142 143 138  K 4.2  < > 3.9 3.9 3.9  CL 98  < > 98 99 97  CO2 19  < > 21 21 24   GLUCOSE 194*  < > 146* 115* 123*  BUN 94*  < > 97* 58* 67*  CREATININE 11.28*  < > 11.23* 7.92* 8.70*  CALCIUM 9.1  < > 8.8 8.8 9.1  PHOS 6.0*  --  7.5* 5.8*  --   < > = values in this interval not displayed.  Recent Labs Lab 09/01/14 1808 09/02/14 0224 09/04/14 1550 09/05/14 0540  AST 10  --   --   --   ALT 13  --   --   --   ALKPHOS 65  --   --   --   BILITOT 0.4  --   --   --   PROT 7.5  --   --   --   ALBUMIN 3.0* 2.8* 2.4* 2.4*    Recent Labs Lab 09/01/14 1808  09/04/14 0905 09/04/14 2132 09/05/14 0258 09/06/14 0305  WBC 5.8  < > 11.3* 12.0* 12.5* 11.9*  NEUTROABS 4.2  --  8.3*  --   --   --   HGB 6.2*  < > 7.1* 8.1* 8.0* 7.8*  HCT 19.3*  < > 22.1* 25.4* 25.2* 24.9*  MCV 95.5  < > 93.2  92.7 93.0 93.3  PLT 223  < > 228 242 252 244  < > = values in this interval not displayed. Doug Sou Hold] sodium chloride   Intravenous Once  . antiseptic oral rinse  7 mL Mouth Rinse BID  . atorvastatin  40 mg Oral q1800  . calcitRIOL  0.25 mcg Oral Daily  . carvedilol  37.5 mg Oral BID WC  . cefTAZidime (FORTAZ)  IV  2 g Intravenous Q24H  . darbepoetin (ARANESP) injection - NON-DIALYSIS  200 mcg Subcutaneous Q Thu-1800  . dialysis solution for CAPD/CCPD [facility])   Peritoneal Dialysis UD  . fluticasone  2 spray Each Nare Daily  .  hydrALAZINE  50 mg Oral 3 times per day  . hydrocortisone   Topical QID  . hydrocortisone-pramoxine  1 applicator Rectal BID  . insulin aspart  0-9 Units Subcutaneous TID WC  . insulin aspart  3 Units Subcutaneous TID WC  . insulin detemir  35 Units Subcutaneous BID  . lanthanum  1,000 mg Oral TID WC  . sodium chloride  3 mL Intravenous Q12H  . tiotropium  18 mcg Inhalation Daily     sodium chloride, sodium chloride, acetaminophen **OR** acetaminophen, albuterol, feeding supplement (NEPRO CARB STEADY), heparin, heparin, heparin, hydrALAZINE, HYDROmorphone (DILAUDID) injection, lidocaine (PF), lidocaine-prilocaine, ondansetron (ZOFRAN) IV, pentafluoroprop-tetrafluoroeth

## 2014-09-06 NOTE — Op Note (Signed)
REMOVAL OF PD CATH  Procedure Note  [name] 09/01/2014 - 09/06/2014   Pre-op Diagnosis: infected peritoneal catheter     Post-op Diagnosis: same  Procedure(s): REMOVAL OF PD CATH  Surgeon(s): Coralie Keens, MD  Anesthesia: General  Staff:  Circulator: Delight Hoh, RN Scrub Person: Sharlot Gowda, RN; Megan Day Cavanaugh, RN  Estimated Blood Loss: Minimal                         Jonathon Bailey A   Date: 09/06/2014  Time: 2:25 PM

## 2014-09-06 NOTE — Progress Notes (Signed)
Family Medicine Teaching Service Daily Progress Note Intern Pager: [phone]  Patient name: Jonathon Bailey Medical record number: [id number] Date of birth: Mar 27, 1956 Age: 58 y.o. Gender: male  Primary Care Provider: Thersa Salt, DO Consultants: Nephrology, Surgery Code Status: FULL (discussed on admission)  Pt Overview and Major Events to Date:  09/01/14: Patient with FOBT+ 09/02/14: NGT placed for decompression 09/03/14: Likely to OR today 09/05/14: 5 beat Vtach overnt, patient asx. Flex sigmoid today. Patient self removed NGT this am. 09/06/14: HD this am. Possible sx to remove PD later today.  Assessment and Plan: Jonathon Bailey is a 58 y.o. male presenting with generalized abdominal pain . PMH is significant for ESRD with peritoneal dialysis, T2DM, CAD, HTN, Chronic systolic and diastolic HF, h/o CVA, COPD  Abdominal pain: Peritoneal fluid cloudy and fever to 102F very suspicious for peritonitis. Abdominal xray with moderate stool burden but no acute findings. LA: 1.82>1.06. hgb 6.2 on admission (baseline 7-8). Pt meets sepsis criteria with source of infection, tachycardia, tachypnea, and fever.  CT abdomen pelvis with small amount of free air. Cytology with leukocytosis and intracellular organisms. BCx Enterobacter cloacae. -Continue Tressie Ellis -c/s surgery, appreciate recs:   Will continue w/ NPO until PD cath removed -c/s to renal, appreciate recs: HD cath placed, removal of PD cath per surgery later today -f/u cytology peritoneal fluid, r/o malignancy -Continue to monitor vitals  -keep on telemetry -Dilaudid 0.5mg  q2 PRN pain  Anemia: Baseline Hgb 9-10, on admission Hgb of 6.2. FOBT+, transfused 1 unit pRBCs 09/02/14. Post transfusion hgb 7.1. Hgb today 7.8.. Had colonoscopy in 2009 with no abnormalities noted. Flex sig with possible necrotic hemorrhoid.  -Continue Aranesp -monitor hgb closely -Continue proctofoam, Miralax -hold home plavix for now, restart when  able  ESRD: Receives daily peritoneal HD. Sees Dr Elroy Channel. Missed Sun and Tues sessions. Cr: 8.70 today (baseline 10-11). Slightly acidotic on VBG pH 7.316. S/p 1 amp bicarb -HD today. -Continue Fosrenol -holding garamycin   Tachycardia: Stable. HR 80-100's overnight. EKG: RBBB, LPFB. Troponin negative x1. Patient NPO.   -management as above -Continue home Coreg  Chronic systolic and diastolic HF: ECHO 123456: EF 40-45%. -Continue BB -KVO fluids  HTN: BP elevated overnight.  AM BP 133/57.  -Continue home Coreg, Hydralazine  CAD & H/o CVA.: Patient without signs of ischemia on exam. Neurologically in tact.  -Continue home Lipitor, Coreg -hold plavix at this time due to concern for GI bleed  T2DM: Last A1c: 7.4. On home levemir 75 u BID -Levemir reduced dose @35  units BID -SSI, renal  COPD: -Continue Albuterol PRN & Spiriva daily  FEN/GI: KVO, NPO PPx: SCDs (no heparin d/t GI bleed)  Disposition: Dispo pending surgery and renal recs  Subjective:  Patient reports to me that his abdominal pain is slightly improved but still not baseline.  He reports that he is hungry and tolerated clears well yesterday.  He is eager to have a diet back as tomorrow is "meatloaf day".  I explained that he will be going for surgery this afternoon to remove PD cath.  He voices good understanding of this.  He denies CP, SOB, N/V, rectal bleeding at this time.  Objective: Temp:  [98.2 F (36.8 C)-99.4 F (37.4 C)] 99.4 F (37.4 C) (12/14 0333) Pulse Rate:  [90-107] 90 (12/13 2300) Resp:  [11-25] 15 (12/14 0333) BP: (127-194)/(57-134) 133/57 mmHg (12/14 0333) SpO2:  [93 %-99 %] 97 % (12/14 0333) Weight:  [276 lb 14.4 oz (125.6 kg)] 276 lb 14.4 oz (125.6  kg) (12/14 [id number]) Physical Exam: General: awake, alert, pleasant male, lying in bed undergoing HD, NAD HEENT: [place]/AT, MMM Cardiovascular: RRR, brisk cap refill Respiratory: CTAB, no increased WOB Abdomen: obese, distended (slightly  improved from yesterday), TTP throughout (improving), bowel sounds difficult to ausculate Extremities: WWP, no edema, clubbing or cyanosis. Psych: good eye contact, normal speech, AAOx3  Laboratory:  Recent Labs Lab 09/04/14 2132 09/05/14 0258 09/06/14 0305  WBC 12.0* 12.5* 11.9*  HGB 8.1* 8.0* 7.8*  HCT 25.4* 25.2* 24.9*  PLT 242 252 244    Recent Labs Lab 09/01/14 1808  09/04/14 1550 09/05/14 0540 09/06/14 0305  NA 138  < > 142 143 138  K 4.2  < > 3.9 3.9 3.9  CL 97  < > 98 99 97  CO2 16*  < > 21 21 24   BUN 94*  < > 97* 58* 67*  CREATININE 11.51*  < > 11.23* 7.92* 8.70*  CALCIUM 9.2  < > 8.8 8.8 9.1  PROT 7.5  --   --   --   --   BILITOT 0.4  --   --   --   --   ALKPHOS 65  --   --   --   --   ALT 13  --   --   --   --   AST 10  --   --   --   --   GLUCOSE 292*  < > 146* 115* 123*  < > = values in this interval not displayed.  Imaging/Diagnostic Tests:  Ir Fluoro Guide Cv Line Right  09/04/2014   CLINICAL DATA:  Renal disease, needs dialysis. Poorly functioning PD catheter.  EXAM: TUNNELED HEMODIALYSIS CATHETER PLACEMENT WITH ULTRASOUND AND FLUOROSCOPIC GUIDANCE  TECHNIQUE: The procedure, risks, benefits, and alternatives were explained to the patient. Questions regarding the procedure were encouraged and answered. The patient understands and consents to the procedure. As antibiotic prophylaxis, cefazolin was ordered pre-procedure and administered intravenously within one hour of incision.Patency of the right IJ vein was confirmed with ultrasound with image documentation. An appropriate skin site was determined. Region was prepped using maximum barrier technique including cap and mask, sterile gown, sterile gloves, large sterile sheet, and Chlorhexidine as cutaneous antisepsis. The region was infiltrated locally with 1% lidocaine.  Intravenous Fentanyl and Versed were administered as conscious sedation during continuous cardiorespiratory monitoring by the radiology RN,  with a total moderate sedation time of 13 minutes.  Under real-time ultrasound guidance, the right IJ vein was accessed with a 21 gauge micropuncture needle; the needle tip within the vein was confirmed with ultrasound image documentation. Needle exchanged over the 018 guidewire for transitional dilator, which allowed advancement of a Benson wire into the IVC. Over this, an MPA catheter was advanced. A Hemosplit 19 hemodialysis catheter was tunneled from the right anterior chest wall approach to the right IJ dermatotomy site. The MPA catheter was exchanged over an Amplatz wire for serial vascular dilators which allow placement of a peel-away sheath, through which the catheter was advanced under intermittent fluoroscopy, positioned with its tips in the proximal and midright atrium. Spot chest radiograph confirms good catheter position. No pneumothorax. Catheter was flushed and primed per protocol. Catheter secured externally with O Prolene sutures. The right IJ dermatotomy site was closed with Dermabond.  COMPLICATIONS: COMPLICATIONS None immediate  FLUOROSCOPY TIME:  18 seconds  COMPARISON:  None  IMPRESSION: 1. Technically successful placement of tunneled right IJ hemodialysis catheter with ultrasound and fluoroscopic guidance. Ready for  routine use.   Electronically Signed   By: Arne Cleveland M.D.   On: 09/04/2014 12:05   Ir US Guide Vasc Access Right  09/04/2014   CLINICAL DATA:  Renal disease, needs dialysis. Poorly functioning PD catheter.  EXAM: TUNNELED HEMODIALYSIS CATHETER PLACEMENT WITH ULTRASOUND AND FLUOROSCOPIC GUIDANCE  TECHNIQUE: The procedure, risks, benefits, and alternatives were explained to the patient. Questions regarding the procedure were encouraged and answered. The patient understands and consents to the procedure. As antibiotic prophylaxis, cefazolin was ordered pre-procedure and administered intravenously within one hour of incision.Patency of the right IJ vein was confirmed with  ultrasound with image documentation. An appropriate skin site was determined. Region was prepped using maximum barrier technique including cap and mask, sterile gown, sterile gloves, large sterile sheet, and Chlorhexidine as cutaneous antisepsis. The region was infiltrated locally with 1% lidocaine.  Intravenous Fentanyl and Versed were administered as conscious sedation during continuous cardiorespiratory monitoring by the radiology RN, with a total moderate sedation time of 13 minutes.  Under real-time ultrasound guidance, the right IJ vein was accessed with a 21 gauge micropuncture needle; the needle tip within the vein was confirmed with ultrasound image documentation. Needle exchanged over the 018 guidewire for transitional dilator, which allowed advancement of a Benson wire into the IVC. Over this, an MPA catheter was advanced. A Hemosplit 19 hemodialysis catheter was tunneled from the right anterior chest wall approach to the right IJ dermatotomy site. The MPA catheter was exchanged over an Amplatz wire for serial vascular dilators which allow placement of a peel-away sheath, through which the catheter was advanced under intermittent fluoroscopy, positioned with its tips in the proximal and midright atrium. Spot chest radiograph confirms good catheter position. No pneumothorax. Catheter was flushed and primed per protocol. Catheter secured externally with O Prolene sutures. The right IJ dermatotomy site was closed with Dermabond.  COMPLICATIONS: COMPLICATIONS None immediate  FLUOROSCOPY TIME:  18 seconds  COMPARISON:  None  IMPRESSION: 1. Technically successful placement of tunneled right IJ hemodialysis catheter with ultrasound and fluoroscopic guidance. Ready for routine use.   Electronically Signed   By: Arne Cleveland M.D.   On: 09/04/2014 12:05    Janora Norlander, DO 09/06/2014, 6:50 AM PGY-1, [place] Intern pager: [phone], text pages welcome

## 2014-09-06 NOTE — Transfer of Care (Signed)
Immediate Anesthesia Transfer of Care Note  Patient: Jonathon Bailey  Procedure(s) Performed: Procedure(s): REMOVAL OF PD CATH (N/A)  Patient Location: PACU  Anesthesia Type:General  Level of Consciousness: awake, alert , oriented and patient cooperative  Airway & Oxygen Therapy: Patient Spontanous Breathing and Patient connected to nasal cannula oxygen  Post-op Assessment: Report given to PACU RN, Post -op Vital signs reviewed and stable and Patient moving all extremities  Post vital signs: Reviewed and stable  Complications: No apparent anesthesia complications

## 2014-09-06 NOTE — Progress Notes (Signed)
Patient off the floor but needs PD catheter removed per renal service.  Growing Enterobacter species.  Will try to get on the schedule for later today.  Will speak with Dr. Ninfa Linden who is on surgical service this week.  Keep NPO.  Jonathon Bailey. [name], MD, [place] [phone] [facility] Surgery

## 2014-09-07 ENCOUNTER — Encounter (HOSPITAL_COMMUNITY): Payer: Self-pay | Admitting: Surgery

## 2014-09-07 DIAGNOSIS — K922 Gastrointestinal hemorrhage, unspecified: Secondary | ICD-10-CM

## 2014-09-07 DIAGNOSIS — R1084 Generalized abdominal pain: Secondary | ICD-10-CM | POA: Insufficient documentation

## 2014-09-07 LAB — CULTURE, BLOOD (ROUTINE X 2)
Culture: NO GROWTH
Culture: NO GROWTH

## 2014-09-07 LAB — GLUCOSE, CAPILLARY
GLUCOSE-CAPILLARY: 154 mg/dL — AB (ref 70–99)
GLUCOSE-CAPILLARY: 148 mg/dL — AB (ref 70–99)
Glucose-Capillary: 171 mg/dL — ABNORMAL HIGH (ref 70–99)
Glucose-Capillary: 252 mg/dL — ABNORMAL HIGH (ref 70–99)

## 2014-09-07 LAB — BASIC METABOLIC PANEL
Anion gap: 18 — ABNORMAL HIGH (ref 5–15)
BUN: 46 mg/dL — AB (ref 6–23)
CHLORIDE: 97 meq/L (ref 96–112)
CO2: 23 mEq/L (ref 19–32)
Calcium: 9.3 mg/dL (ref 8.4–10.5)
Creatinine, Ser: 7.04 mg/dL — ABNORMAL HIGH (ref 0.50–1.35)
GFR calc non Af Amer: 8 mL/min — ABNORMAL LOW (ref >90)
GFR, EST AFRICAN AMERICAN: 9 mL/min — AB (ref >90)
Glucose, Bld: 150 mg/dL — ABNORMAL HIGH (ref 70–99)
Potassium: 4.3 mEq/L (ref 3.7–5.3)
Sodium: 138 mEq/L (ref 137–147)

## 2014-09-07 LAB — CBC
HEMATOCRIT: 28.6 % — AB (ref 39.0–52.0)
Hemoglobin: 8.6 g/dL — ABNORMAL LOW (ref 13.0–17.0)
MCH: 28.8 pg (ref 26.0–34.0)
MCHC: 30.1 g/dL (ref 30.0–36.0)
MCV: 95.7 fL (ref 78.0–100.0)
Platelets: 308 10*3/uL (ref 150–400)
RBC: 2.99 MIL/uL — ABNORMAL LOW (ref 4.22–5.81)
RDW: 15.3 % (ref 11.5–15.5)
WBC: 12.5 10*3/uL — AB (ref 4.0–10.5)

## 2014-09-07 MED ORDER — POLYETHYLENE GLYCOL 3350 17 G PO PACK
17.0000 g | PACK | Freq: Every day | ORAL | Status: DC
  Administered 2014-09-08 – 2014-09-10 (×2): 17 g via ORAL
  Filled 2014-09-07 (×4): qty 1

## 2014-09-07 MED ORDER — CLOPIDOGREL BISULFATE 75 MG PO TABS
75.0000 mg | ORAL_TABLET | Freq: Every day | ORAL | Status: DC
  Administered 2014-09-07 – 2014-09-09 (×3): 75 mg via ORAL
  Filled 2014-09-07 (×5): qty 1

## 2014-09-07 MED ORDER — OXYCODONE-ACETAMINOPHEN 5-325 MG PO TABS
1.0000 | ORAL_TABLET | ORAL | Status: DC | PRN
  Administered 2014-09-07 – 2014-09-08 (×6): 2 via ORAL
  Filled 2014-09-07 (×5): qty 2

## 2014-09-07 MED ORDER — DIPHENHYDRAMINE HCL 25 MG PO CAPS
25.0000 mg | ORAL_CAPSULE | Freq: Four times a day (QID) | ORAL | Status: DC | PRN
  Administered 2014-09-07 – 2014-09-09 (×3): 25 mg via ORAL
  Filled 2014-09-07 (×3): qty 1

## 2014-09-07 NOTE — Op Note (Signed)
NAME:  [name], [name] NO.:  [id number]  MEDICAL RECORD NO.:  [id number]  LOCATION:  2H22C                        FACILITY:  [facility]  PHYSICIAN:  Coralie Keens, M.D. DATE OF BIRTH:  1955/09/30  DATE OF PROCEDURE:  09/06/2014 DATE OF DISCHARGE:                              OPERATIVE REPORT   PREOPERATIVE DIAGNOSIS:  Infected peritoneal dialysis catheter.  POSTOPERATIVE DIAGNOSIS:  Infected peritoneal dialysis catheter.  PROCEDURE:  Removal of infected peritoneal dialysis catheter.  SURGEON:  Coralie Keens, MD  ANESTHESIA:  General and 0.25% Marcaine with epinephrine.  ESTIMATED BLOOD LOSS:  Minimal.  PROCEDURE IN DETAIL:  The patient was brought to the operating room and identified as Jonathon Bailey.  He was placed supine on the operating table and general anesthesia was induced.  His abdomen was then prepped and draped in usual sterile fashion.  Using a scalpel, I lengthened the incision where the exit of peritoneal dialysis catheter was.  As I dissected this towards the xiphoid area, I then made a separate counter incision in the xiphoid area and cut down on there which I was able to excise tissue around.  The catheter then tracked back forward the exit site, and I was able to remove the catheter to be done incision.  The catheter then tracked down the subcutaneous tissue towards the pelvis. I made another counterincision and cut down the catheter in this area. I then traveled even further down the abdomen forwarding in the peritoneum site to make a fourth incision through a previous scar and finally I was able to identify the cuff where the catheter entered the peritoneum.  I was then able to excise this cuff and remove the catheter completely from the abdominal cavity.  The entire length of the catheter was then completely removed.  I then anesthetized all the wounds with Marcaine.  I irrigated all the wounds with saline.  I then closed all the  wounds with staples.  The patient tolerated the procedure well.  All the counts were correct at the end of procedure.  The patient was then extubated in the operating room and taken in stable condition to recovery room.     Coralie Keens, M.D.     DB/MEDQ  D:  09/06/2014  T:  09/07/2014  Job:  [id number]

## 2014-09-07 NOTE — Progress Notes (Signed)
[facility] Progress Note   Subjective: abd hurts today but is " a different pain".  Ate breakfast, no SOB, cough or other complaints.   Filed Vitals:   09/07/14 0330 09/07/14 0546 09/07/14 0818 09/07/14 0820  BP:  118/77  121/63  Pulse:      Temp:    98 F (36.7 C)  TempSrc:    Oral  Resp:    19  Height: 5\' 11"  (1.803 m)     Weight: 124 kg (273 lb 5.9 oz)     SpO2:   97% 97%   Exam: Alert, no distress, up in chair No jvd Chest clear bilat RRR no MRG Abd obese, abd remains diffusely tender but soft, PD cath gone Neuro alert, ox 3, nf   HD/PD: Was PD, now HD, s/p TDC by IR. MWF schedule here. CLIP pending     Assessment: 1. PD associated gram negative (E. Cloacae) peritonitis w associated catheter malfunction - catheter w outflow failure since admission, s/p attempt to TPA x 2 w/o improvement, catheter removed yest 09/06/14, appreciate Gen Surg assistance w this. Abd pain slightly better today, on IV Fortaz 2. ESRD on HD, s/p new TDC, start CLIP prob to [facility] 3. Anemia Hb 7's, on max ESA, tsat 10% > starting IV iron load (1 gm total) on wed 4. MBD cont fosrenol, po vit D 5. HTN/vol - on hydral / coreg, wt's down, BP normal, no vol excess 6. DM on insulin 7. Vascular access - had L radiocephalic AVF Jan 123456 which never matured, had fistulogram Jun 2014 which showed patent vessels w max diameter 4.46mm.  At that time VVS recommended ligation of RC AVF and placement of L brachiocephalic AVF. Nothing was ever done and I have d/w further perm access with Mr Plourde today and he is refusing any permanent vascular access.   Plan - HD Wed, cont Abx, OOB , etcKelly Splinter MD  pager [phone]    cell [phone]  09/07/2014, 9:19 AM     Recent Labs Lab 09/02/14 0224  09/04/14 1550 09/05/14 0540 09/06/14 0305  NA 140  < > 142 143 138  K 4.2  < > 3.9 3.9 3.9  CL 98  < > 98 99 97  CO2 19  < > 21 21 24   GLUCOSE 194*  < > 146* 115* 123*  BUN 94*  < > 97*  58* 67*  CREATININE 11.28*  < > 11.23* 7.92* 8.70*  CALCIUM 9.1  < > 8.8 8.8 9.1  PHOS 6.0*  --  7.5* 5.8*  --   < > = values in this interval not displayed.  Recent Labs Lab 09/01/14 1808 09/02/14 0224 09/04/14 1550 09/05/14 0540  AST 10  --   --   --   ALT 13  --   --   --   ALKPHOS 65  --   --   --   BILITOT 0.4  --   --   --   PROT 7.5  --   --   --   ALBUMIN 3.0* 2.8* 2.4* 2.4*    Recent Labs Lab 09/01/14 1808  09/04/14 0905 09/04/14 2132 09/05/14 0258 09/06/14 0305  WBC 5.8  < > 11.3* 12.0* 12.5* 11.9*  NEUTROABS 4.2  --  8.3*  --   --   --   HGB 6.2*  < > 7.1* 8.1* 8.0* 7.8*  HCT 19.3*  < > 22.1* 25.4* 25.2* 24.9*  MCV 95.5  < > 93.2 92.7 93.0 93.3  PLT 223  < > 228 242 252 244  < > = values in this interval not displayed. Marland Kitchen antiseptic oral rinse  7 mL Mouth Rinse BID  . atorvastatin  40 mg Oral q1800  . calcitRIOL  0.25 mcg Oral Daily  . carvedilol  37.5 mg Oral BID WC  . cefTAZidime (FORTAZ)  IV  2 g Intravenous Q24H  . clopidogrel  75 mg Oral Daily  . darbepoetin (ARANESP) injection - NON-DIALYSIS  200 mcg Subcutaneous Q Thu-1800  . [START ON 09/08/2014] ferric gluconate (FERRLECIT/NULECIT) IV  125 mg Intravenous Q M,W,F-HD  . fluticasone  2 spray Each Nare Daily  . hydrALAZINE  50 mg Oral 3 times per day  . hydrocortisone   Topical QID  . hydrocortisone-pramoxine  1 applicator Rectal BID  . insulin aspart  0-9 Units Subcutaneous TID WC  . insulin aspart  3 Units Subcutaneous TID WC  . insulin detemir  35 Units Subcutaneous BID  . lanthanum  1,000 mg Oral TID WC  . polyethylene glycol  17 g Oral Daily  . sodium chloride  3 mL Intravenous Q12H  . tiotropium  18 mcg Inhalation Daily     sodium chloride, sodium chloride, acetaminophen **OR** acetaminophen, albuterol, diphenhydrAMINE, feeding supplement (NEPRO CARB STEADY), heparin, heparin, heparin, hydrALAZINE, HYDROcodone-acetaminophen, HYDROmorphone (DILAUDID) injection, lidocaine (PF),  lidocaine-prilocaine, morphine injection, ondansetron (ZOFRAN) IV, pentafluoroprop-tetrafluoroeth

## 2014-09-07 NOTE — Progress Notes (Signed)
Family Medicine Teaching Service Daily Progress Note Intern Pager: [phone]  Patient name: Jonathon Bailey Medical record number: [id number] Date of birth: [birth year] Age: 58 y.o. Gender: male  Primary Care Provider: Thersa Salt, DO Consultants: Nephrology, Surgery Code Status: FULL (discussed on admission)  Pt Overview and Major Events to Date:  09/01/14: Patient with FOBT+ 09/02/14: NGT placed for decompression 09/03/14: Likely to OR today 09/05/14: 5 beat Vtach overnt, patient asx. Flex sigmoid today. Patient self removed NGT this am. 09/06/14: HD this am. Sx removed PD.  Assessment and Plan: Jonathon Bailey is a 58 y.o. male presenting with generalized abdominal pain . PMH is significant for ESRD with peritoneal dialysis, T2DM, CAD, HTN, Chronic systolic and diastolic HF, h/o CVA, COPD  Abdominal pain: Peritoneal fluid cloudy and fever to 102F very suspicious for peritonitis. Abdominal xray with moderate stool burden but no acute findings. LA: 1.82>1.06. hgb 6.2 on admission (baseline 7-8). Pt meets sepsis criteria with source of infection, tachycardia, tachypnea, and fever.  CT abdomen pelvis with small amount of free air. Cytology with leukocytosis and intracellular organisms, no malignant cells identified. FluidCx Enterobacter cloacae.    -Continue Fortaz  -Percocet 5 q4 PRN pain -Continue to monitor vitals  -keep on telemetry -c/s surgery, appreciate recs: PD cath removed 12/14 -c/s to renal, appreciate recs: HD cath placed  Anemia: Baseline Hgb 7-8, on admission Hgb of 6.2. FOBT+, transfused 1 unit pRBCs 09/02/14. Post transfusion hgb 7.1. Hgb today 8.6. Had colonoscopy in 2009 with no abnormalities noted. Flex sig with possible necrotic hemorrhoid.  -Continue Aranesp -monitor hgb closely -Continue proctofoam, Miralax -hold home plavix for now, restart when able  ESRD: Receives daily peritoneal HD. Sees Dr Elroy Channel. Missed Sun and Tues sessions. Cr: 7.4 today  (baseline 10-11). Slightly acidotic on VBG pH 7.316. S/p 1 amp bicarb -HD tomorrow. -Continue Fosrenol -holding garamycin   Tachycardia: Stable. HR 90's overnight. EKG: RBBB, LPFB. Troponin negative x1. Patient NPO.   -management as above -Continue home Coreg  Chronic systolic and diastolic HF: ECHO 123456: EF 40-45%. -Continue BB -KVO fluids  HTN: BP elevated overnight.  AM BP 118/77.  -Continue home Coreg, Hydralazine  CAD & H/o CVA.: Patient without signs of ischemia on exam. Neurologically in tact.  -Continue home Lipitor, Coreg -hold plavix at this time due to concern for GI bleed  T2DM: Last A1c: 7.4. On home levemir 75 u BID -Levemir reduced dose @35  units BID -SSI, renal  COPD: -Continue Albuterol PRN & Spiriva daily  FEN/GI: KVO, Carb modified diet PPx: SCDs (no heparin d/t GI bleed)  Disposition: Dispo pending surgery and renal recs  Subjective:  Patient reports abdominal discomfort d/t surgery but that prior abdominal pain is improving.  He reports flatus after surgery but no BM yet.  Denies rectal bleeding, N/V, pain anywhere else.  Objective: Temp:  [97.4 F (36.3 C)-98.7 F (37.1 C)] 98.2 F (36.8 C) (12/15 1151) Pulse Rate:  [90-104] 91 (12/15 0305) Resp:  [12-29] 17 (12/15 1151) BP: (81-153)/(43-85) 98/43 mmHg (12/15 1151) SpO2:  [87 %-100 %] 91 % (12/15 1151) Weight:  [273 lb 5.9 oz (124 kg)] 273 lb 5.9 oz (124 kg) (12/15 0330) Physical Exam: General: awake, alert, pleasant male, sitting up at bedside, NAD HEENT: [place]/AT, MMM Cardiovascular: RRR, brisk cap refill Respiratory: CTAB, no increased WOB Abdomen: obese, distended (improved from yesterday), TTP over surgical sites (covered) and at RLQ, bowel sounds difficult to ausculate Extremities: WWP, no edema, clubbing or cyanosis. Psych: good eye contact,  normal speech, AAOx3  Laboratory:  Recent Labs Lab 09/05/14 0258 09/06/14 0305 09/07/14 0843  WBC 12.5* 11.9* 12.5*  HGB 8.0* 7.8* 8.6*   HCT 25.2* 24.9* 28.6*  PLT 252 244 308    Recent Labs Lab 09/01/14 1808  09/05/14 0540 09/06/14 0305 09/07/14 0843  NA 138  < > 143 138 138  K 4.2  < > 3.9 3.9 4.3  CL 97  < > 99 97 97  CO2 16*  < > 21 24 23   BUN 94*  < > 58* 67* 46*  CREATININE 11.51*  < > 7.92* 8.70* 7.04*  CALCIUM 9.2  < > 8.8 9.1 9.3  PROT 7.5  --   --   --   --   BILITOT 0.4  --   --   --   --   ALKPHOS 65  --   --   --   --   ALT 13  --   --   --   --   AST 10  --   --   --   --   GLUCOSE 292*  < > 115* 123* 150*  < > = values in this interval not displayed.  Imaging/Diagnostic Tests:  No results found.  Jonathon Norlander, DO 09/07/2014, 12:41 PM PGY-1, [place] Intern pager: [phone], text pages welcome

## 2014-09-07 NOTE — Progress Notes (Signed)
Patient ID: Jonathon Bailey, male   DOB: Aug 15, 1956, 58 y.o.   MRN: [id number]   Comfortable I had to make multiple incisions to remove the catheter secondary to multiple cuffs and the tunneling at insertion.  There are 4 incisions. Incisions are clean  Will keep the staples in until next week.

## 2014-09-07 NOTE — Progress Notes (Signed)
**  Interval Note**  RN paged regarding patient's continued abdominal pain since switching to PO medications.  Visited patient.  Patient reports Oxycodone is working now.  On exam, he is alert, pleasant and eating lunch.  Norco seemed to not work well earlier this am.  Will continue with Oxycodone as needed.  If patient having breakthrough pain before 4 hours, will add Dilaudid 1mg  PO x1.  Ashly M. Lajuana Ripple, DO PGY-1, [place]

## 2014-09-07 NOTE — Care Management Note (Addendum)
    Page 1 of 1   09/10/2014     9:24:12 AM CARE MANAGEMENT NOTE 09/10/2014  Patient:  Jonathon Bailey, Jonathon Bailey   Account Number:  [id number]  Date Initiated:  09/07/2014  Documentation initiated by:  Elissa Hefty  Subjective/Objective Assessment:   adm abd pain     Action/Plan:   lives w fam, pcp dr Blanch Media cook   Anticipated DC Date:  09/10/2014   Anticipated DC Plan:  [place]  CM consult      Choice offered to / List presented to:          [facility] arranged  [place] - 11 Patient Refused      Status of service:   Medicare Important Message given?  YES (If response is "NO", the following Medicare IM given date fields will be blank) Date Medicare IM given:  09/07/2014 Medicare IM given by:  Elissa Hefty Date Additional Medicare IM given:  09/10/2014 Additional Medicare IM given by:  [facility] Auden Tatar  Discharge Disposition:  HOME/SELF CARE  Per UR Regulation:  Reviewed for med. necessity/level of care/duration of stay  If discussed at [place] of Stay Meetings, dates discussed:   09/09/2014    Comments:  12/18 0914 debbie Rayvin Abid rn,bsn spoke w pt. he does not want hhc. phy ther saw pt and states doing better today and they do not rec hhpt either.

## 2014-09-08 LAB — BASIC METABOLIC PANEL
Anion gap: 19 — ABNORMAL HIGH (ref 5–15)
BUN: 59 mg/dL — AB (ref 6–23)
CO2: 23 meq/L (ref 19–32)
Calcium: 9.3 mg/dL (ref 8.4–10.5)
Chloride: 94 mEq/L — ABNORMAL LOW (ref 96–112)
Creatinine, Ser: 9.04 mg/dL — ABNORMAL HIGH (ref 0.50–1.35)
GFR calc Af Amer: 7 mL/min — ABNORMAL LOW (ref >90)
GFR calc non Af Amer: 6 mL/min — ABNORMAL LOW (ref >90)
GLUCOSE: 101 mg/dL — AB (ref 70–99)
POTASSIUM: 4.1 meq/L (ref 3.7–5.3)
SODIUM: 136 meq/L — AB (ref 137–147)

## 2014-09-08 LAB — GLUCOSE, CAPILLARY
GLUCOSE-CAPILLARY: 135 mg/dL — AB (ref 70–99)
GLUCOSE-CAPILLARY: 135 mg/dL — AB (ref 70–99)
GLUCOSE-CAPILLARY: 101 mg/dL — AB (ref 70–99)

## 2014-09-08 LAB — CBC
HEMATOCRIT: 25.6 % — AB (ref 39.0–52.0)
HEMOGLOBIN: 8.2 g/dL — AB (ref 13.0–17.0)
MCH: 31.1 pg (ref 26.0–34.0)
MCHC: 32 g/dL (ref 30.0–36.0)
MCV: 97 fL (ref 78.0–100.0)
Platelets: 295 10*3/uL (ref 150–400)
RBC: 2.64 MIL/uL — AB (ref 4.22–5.81)
RDW: 14.8 % (ref 11.5–15.5)
WBC: 14 10*3/uL — ABNORMAL HIGH (ref 4.0–10.5)

## 2014-09-08 MED ORDER — LIDOCAINE HCL (PF) 1 % IJ SOLN: 5.0000 mL | INTRAMUSCULAR | Status: DC | PRN

## 2014-09-08 MED ORDER — HEPARIN SODIUM (PORCINE) 1000 UNIT/ML DIALYSIS: 2000.0000 [IU] | INTRAMUSCULAR | Status: DC | PRN

## 2014-09-08 MED ORDER — HYDROCORTISONE 2.5 % RE CREA: TOPICAL_CREAM | Freq: Four times a day (QID) | RECTAL | Status: DC

## 2014-09-08 MED ORDER — LIDOCAINE-PRILOCAINE 2.5-2.5 % EX CREA
1.0000 | TOPICAL_CREAM | CUTANEOUS | Status: DC | PRN
Start: 2014-09-08 — End: 2014-09-08

## 2014-09-08 MED ORDER — SODIUM CHLORIDE 0.9 % IV SOLN: 100.0000 mL | INTRAVENOUS | Status: DC | PRN

## 2014-09-08 MED ORDER — HYDROMORPHONE HCL 2 MG PO TABS: 1.0000 mg | ORAL_TABLET | ORAL | Status: DC | PRN

## 2014-09-08 MED ORDER — DEXTROSE 5 % IV SOLN: 2.0000 g | INTRAVENOUS | Status: DC | PRN

## 2014-09-08 MED ORDER — DEXTROSE 5 % IV SOLN
2.0000 g | INTRAVENOUS | Status: DC
  Filled 2014-09-08: qty 2

## 2014-09-08 MED ORDER — DEXTROSE 5 % IV SOLN
2.0000 g | INTRAVENOUS | Status: DC
  Administered 2014-09-08: 2 g via INTRAVENOUS
  Filled 2014-09-08: qty 2

## 2014-09-08 MED ORDER — DEXTROSE 5 % IV SOLN: 2.0000 g | INTRAVENOUS | Status: DC

## 2014-09-08 MED ORDER — HYDROMORPHONE HCL 2 MG PO TABS
1.0000 mg | ORAL_TABLET | ORAL | Status: DC | PRN
  Administered 2014-09-08 – 2014-09-10 (×9): 1 mg via ORAL
  Filled 2014-09-08 (×9): qty 1

## 2014-09-08 MED ORDER — NEPRO/CARBSTEADY PO LIQD: 237.0000 mL | ORAL | Status: DC | PRN

## 2014-09-08 MED ORDER — PENTAFLUOROPROP-TETRAFLUOROETH EX AERO: 1.0000 "application " | INHALATION_SPRAY | CUTANEOUS | Status: DC | PRN

## 2014-09-08 MED ORDER — HYDROMORPHONE HCL 2 MG PO TABS
1.0000 mg | ORAL_TABLET | Freq: Once | ORAL | Status: AC
  Administered 2014-09-08: 1 mg via ORAL
  Filled 2014-09-08: qty 1

## 2014-09-08 MED ORDER — ALTEPLASE 2 MG IJ SOLR: 2.0000 mg | Freq: Once | INTRAMUSCULAR | Status: DC | PRN

## 2014-09-08 MED ORDER — HEPARIN SODIUM (PORCINE) 1000 UNIT/ML DIALYSIS: 1000.0000 [IU] | INTRAMUSCULAR | Status: DC | PRN

## 2014-09-08 MED ORDER — POLYETHYLENE GLYCOL 3350 17 G PO PACK: 17.0000 g | PACK | Freq: Every day | ORAL | Status: AC

## 2014-09-08 MED ORDER — OXYCODONE-ACETAMINOPHEN 5-325 MG PO TABS
ORAL_TABLET | ORAL | Status: AC
  Filled 2014-09-08: qty 2

## 2014-09-08 MED ORDER — HYDROCORTISONE ACE-PRAMOXINE 1-1 % RE FOAM: 1.0000 | Freq: Two times a day (BID) | RECTAL | Status: DC

## 2014-09-08 NOTE — Progress Notes (Signed)
[facility] Progress Note  Assessment/Plan: 1. PD associated gram negative (E. Cloacae) peritonitis w associated catheter malfunction - catheter w outflow failure since admission, s/p attempt to TPA x 2 w/o improvement, catheter removed yest 09/06/14, appreciate Gen Surg assistance w this. Abd pain slightly better today, on IV Fortaz- change to HD days 2. ESRD on HD, s/p new TDC, start CLIP prob to [facility] 3. Anemia Hb 8.2 stable, on max ESA, tsat 10% > starting IV iron load 12/16(1 gm total)  4. MBD cont fosrenol, po vit D- need to convert to hectorol at d/c - will find out last iPTH 5. HTN/vol - on hydral / coreg, wt's down, BP 120sl, no vol excess - weight gain since yesterday - ^ goal to 1000 6. DM on insulin- per primary 7. Vascular access - had L radiocephalic AVF Jan 123456 which never matured, had fistulogram Jun 2014 which showed patent vessels w max diameter 4.61mm. At that time VVS recommended ligation of RC AVF and placement of L brachiocephalic AVF. This issue has been discussed with him by his primary nephrologist, Dr. Jonnie Finner and myself; he adamantly refuses permanent access in spite of the associated risks which have been explained to him in detail numerous times.  Myriam Jacobson, PA-C [place] 09/08/2014,8:54 AM  LOS: 7 days   Pt seen, examined and agree w A/P as above.  [name] MD pager [phone]    cell [phone] 09/08/2014, 2:20 PM    Subjective:   Refuses avf Objective Filed Vitals:   09/08/14 0753 09/08/14 0758 09/08/14 0805 09/08/14 0834  BP: 135/73 142/74 151/83 140/74  Pulse: 85 91 91 95  Temp: 98.1 F (36.7 C)     TempSrc: Oral     Resp: 33 20 18 23   Height:      Weight: 126 kg (277 lb 12.5 oz)     SpO2: 97%      Physical Exam General: talkative, NAD Heart: RRR Lungs: no rales Abdomen: soft + BS sore Extremities: no sig edema Dialysis Access:  Right IJ  Dialysis Orders: Was PD, now HD, s/p  TDC by IR. MWF schedule here. CLIP pending  Additional Objective Labs: Basic Metabolic Panel:  Recent Labs Lab 09/02/14 0224  09/04/14 1550 09/05/14 0540 09/06/14 0305 09/07/14 0843 09/08/14 0730  NA 140  < > 142 143 138 138 136*  K 4.2  < > 3.9 3.9 3.9 4.3 4.1  CL 98  < > 98 99 97 97 94*  CO2 19  < > 21 21 24 23 23   GLUCOSE 194*  < > 146* 115* 123* 150* 101*  BUN 94*  < > 97* 58* 67* 46* 59*  CREATININE 11.28*  < > 11.23* 7.92* 8.70* 7.04* 9.04*  CALCIUM 9.1  < > 8.8 8.8 9.1 9.3 9.3  PHOS 6.0*  --  7.5* 5.8*  --   --   --   < > = values in this interval not displayed. Liver Function Tests:  Recent Labs Lab 09/01/14 1808 09/02/14 0224 09/04/14 1550 09/05/14 0540  AST 10  --   --   --   ALT 13  --   --   --   ALKPHOS 65  --   --   --   BILITOT 0.4  --   --   --   PROT 7.5  --   --   --   ALBUMIN 3.0* 2.8* 2.4* 2.4*   CBC:  Recent Labs  Lab 09/01/14 1808  09/04/14 0905 09/04/14 2132 09/05/14 0258 09/06/14 0305 09/07/14 0843 09/08/14 0730  WBC 5.8  < > 11.3* 12.0* 12.5* 11.9* 12.5* 14.0*  NEUTROABS 4.2  --  8.3*  --   --   --   --   --   HGB 6.2*  < > 7.1* 8.1* 8.0* 7.8* 8.6* 8.2*  HCT 19.3*  < > 22.1* 25.4* 25.2* 24.9* 28.6* 25.6*  MCV 95.5  < > 93.2 92.7 93.0 93.3 95.7 97.0  PLT 223  < > 228 242 252 244 308 295  < > = values in this interval not displayed. Blood Culture    Component Value Date/Time   SDES FLUID PERITONEAL 09/03/2014 1350   SPECREQUEST NONE 09/03/2014 1350   CULT  09/02/2014 0159    INSIGNIFICANT GROWTH Performed at [place] 09/03/2014 FINAL 09/03/2014 1350    Cardiac Enzymes:  Recent Labs Lab 09/02/14 1316  TROPONINI <0.30   CBG:  Recent Labs Lab 09/06/14 2133 09/07/14 0816 09/07/14 1147 09/07/14 1557 09/07/14 2120  GLUCAP 133* 154* 171* 252* 148*  Medications:   . antiseptic oral rinse  7 mL Mouth Rinse BID  . atorvastatin  40 mg Oral q1800  . calcitRIOL  0.25 mcg Oral Daily  .  carvedilol  37.5 mg Oral BID WC  . cefTAZidime (FORTAZ)  IV  2 g Intravenous Q48H  . clopidogrel  75 mg Oral Daily  . darbepoetin (ARANESP) injection - NON-DIALYSIS  200 mcg Subcutaneous Q Thu-1800  . ferric gluconate (FERRLECIT/NULECIT) IV  125 mg Intravenous Q M,W,F-HD  . fluticasone  2 spray Each Nare Daily  . hydrALAZINE  50 mg Oral 3 times per day  . hydrocortisone   Topical QID  . hydrocortisone-pramoxine  1 applicator Rectal BID  . insulin aspart  0-9 Units Subcutaneous TID WC  . insulin aspart  3 Units Subcutaneous TID WC  . insulin detemir  35 Units Subcutaneous BID  . lanthanum  1,000 mg Oral TID WC  . polyethylene glycol  17 g Oral Daily  . sodium chloride  3 mL Intravenous Q12H  . tiotropium  18 mcg Inhalation Daily

## 2014-09-08 NOTE — Progress Notes (Addendum)
09/08/2014 4:02 PM Hemodialysis Outpatient Note; this patient has been accepted at the [facility] on a Tuesday, Thursday and Saturday 1st shift schedule.For Saturday December 22 ONLY, he will need to report to the center for 11:00 AM, all subsequent treatments will be 1st shift chair time of 5:45 AM. Thank you. Jenetta Downer H 4:28 PM Addendum; Mr Warf can start Saturday December 19th at 11:00 AM.  Thanks. Gordy Savers

## 2014-09-08 NOTE — Progress Notes (Signed)
Patient ID: Jonathon Bailey, male   DOB: 01-Nov-1955, 58 y.o.   MRN: [id number]  Pt sore at PD cath removal sites  Wounds clean without erythema  Continue current care

## 2014-09-08 NOTE — Discharge Summary (Signed)
[facility] Discharge Summary  Patient name: Jonathon Bailey Medical record number: [id number] Date of birth: [birth year] Age: 58 y.o. Gender: male Date of Admission: 09/01/2014  Date of Discharge: 09/09/14 Admitting Physician: Dickie La, MD  Primary Care Provider: Thersa Salt, DO Consultants: Nephrology, Surgery, Gastroenterology  Indication for Hospitalization: abdominal pain, peritonitis  Discharge Diagnoses/Problem List:  Generalized abdominal pain ESRD on dialysis (peritoneal>>HD) Anemia Bacterial peritonitis T2DM  Disposition: Discharge home  Discharge Condition: Stable  Discharge Exam:  BP 119/72 mmHg  Pulse 84  Temp(Src) 97.9 F (36.6 C) (Oral)  Resp 15  Ht 5\' 11"  (1.803 m)  Wt 280 lb (127.007 kg)  BMI 39.07 kg/m2  SpO2 97%  General: awake, alert, pleasant male, lying in bed undergoing HD, NAD HEENT: [place]/AT, MMM Cardiovascular: RRR, brisk cap refill Respiratory: (hard to auscultate with sound of HD machine) but seemingly CTAB, no increased WOB Abdomen: protuberant, mildly distended, TTP over surgical sites (x4 with staples) and at RLQ, +bowel sounds x4 Extremities: WWP, no edema, clubbing or cyanosis. Psych: good eye contact, normal speech, AAOx3  Brief Hospital Course:  Jonathon Bailey is a 58 y.o. Male that presented with generalized abdominal pain after missing 2 days of PD. PMH is significant for ESRD with peritoneal dialysis, T2DM, CAD, HTN, Chronic systolic and diastolic HF, h/o CVA, COPD.  In ED, patient was febrile to 102.6 rectally. He had chest and abd xray which were unrevealing, Cr 11.51, LA 1.8, VBG: pH 7.316, pCO2 40.8, WBC normal. HD nurse was unsuccessful at getting any fluid off patient through his peritoneal HD site. Blood cultures & pleural fluid cultures were obtained and patient was started on Vanc and Zosyn. Patient also received 2 doses of Dilaudid 1mg  for pain.  Patient was admitted to [facility] for further  management.  On exam, patient's abdomen was exquitely TTP and distended.  He was tachycardic to the 120's, tachypnic and febrile, meeting criteria for sepsis.  He was monitored on telemetry in the step down unit for this reason.  Renal was consulted, who agreed with continuing with antibiotic therapy until cultures resulted.  CT abdomen revealed a small amount of free air in the abdomen.  Surgery was also consulted, who recommended patient have an alternative HD site placed and PD cath removed afterwards.  While not much fluid was able to be taken from patient's PD, there was enough to run fluid studies.  Cytology of pleural fluid was significant for a leukocytosis and negative for malignancy.  On HD#2, patient began vomiting and was treated with NGT decompression, which he tolerated well.  He had his tunnel cath placed 2 days later.  Of note, patient was highly encouraged to have a fistula placed, but patient declined this procedure on several occassions.  Pleural fluid cultures grew enterobacter.  His antibiotic therapy was changed to [facility], which was given during his HD.  On HD#5, patient's PD cath was surgically removed.  He tolerated this well.  He was continued on antibiotic therapy.  On HD #7, patient spiked a fever to 102.15F.  He defervesced on his own and was closely monitored for the next 24 hours.  Patient's fever did not return.  He was evaluated by PT, who stated that patient was safe to go home with self care.  Patient was discharged in stable condition, with a clear pain regimen and close follow up with his PCP and surgeon.  He voiced good understanding of his HD schedule.  In addition, renal  is to dose patient's Hectorol and continue Fortaz with outpatient HD until 09/21/14.   On admission, patient was noted to be anemic to a hgb of 6.2.  He was reporting tarry stools at home with constipation.  He also endorsed rectal pain after self manual disimpaction at home.  FOBT was obtained and was  positive.  As aforementioned, CT abdomen was negative for intraabdominal pathology.  His home plavix was held and GI was consulted.  Patient was transfused x 2 units of pRBCs, to which he responded well.  A flex sigmoidoscopy was performed, which revealed only an excoriated external hemorrhoid.  He was prescribed rectal cream for inflammation and pain, which he tolerated well.  Upon discharge, patient's hgb was stable at 7.8 (baseline 7-8).    Once patient was allowed PO, he was continued on his home Plavix, T2DM, COPD, BP and HLD medications.  Issues for Follow Up:   Recommend encouraging patient for fistula placement  Patient has HD scheduled for Saturday December 19th at 11:00 AM @ [facility].  Normal schedule will be on a Tuesday, Thursday and Saturday 1st shift schedule @5 :45am.  Patient to have staples removed by surgery next week.  Per renal, they will dose Hectorol and continue Fortaz during HD for 2 weeks from 09/07/14.  Significant Procedures: surgical removal of PD catheter  Significant Labs and Imaging:   Recent Labs Lab 09/08/14 0730 09/09/14 1245 09/10/14 0238  WBC 14.0* 13.5* 12.4*  HGB 8.2* 8.1* 7.8*  HCT 25.6* 26.5* 24.9*  PLT 295 309 419*    Recent Labs Lab 09/05/14 0540 09/06/14 0305 09/07/14 0843 09/08/14 0730 09/09/14 1245 09/10/14 0238  NA 143 138 138 136* 135* 133*  K 3.9 3.9 4.3 4.1 4.1 4.7  CL 99 97 97 94* 95* 93*  CO2 21 24 23 23 24 23   GLUCOSE 115* 123* 150* 101* 174* 220*  BUN 58* 67* 46* 59* 33* 42*  CREATININE 7.92* 8.70* 7.04* 9.04* 7.01* 8.12*  CALCIUM 8.8 9.1 9.3 9.3 9.1 9.1  PHOS 5.8*  --   --   --   --   --   ALBUMIN 2.4*  --   --   --   --   --    Fluid culture: Enterobacter cloacae, atypical cells but no malignancy Blood culture: no growth  Ct Abdomen Pelvis Wo Contrast  09/02/2014   CLINICAL DATA:  Abdominal pain and distention. Stent from dialysis for fever and elevated blood pressure.  EXAM: CT ABDOMEN  AND PELVIS WITHOUT CONTRAST  TECHNIQUE: Multidetector CT imaging of the abdomen and pelvis was performed following the standard protocol without IV contrast.  COMPARISON:  None.  FINDINGS: Atelectasis in the lung bases.  Coronary artery calcifications.  Small amount of free fluid in the upper abdomen, extending along the pericolic gutters to the pelvis. Small amount of free air demonstrated in the upper abdomen with tiny gas bubble noted. A pelvic peritoneal dialysis catheter is noted to be in place, likely accounting for these changes. The unenhanced appearance of the liver, spleen, gallbladder, pancreas, adrenal glands, kidneys, abdominal aorta, inferior vena cava, and retroperitoneal lymph nodes is unremarkable. Stomach is decompressed. Stool-filled colon without distention. Prominent visceral adipose tissues.  Pelvis: Bladder wall is mildly thickened. Bladder infection not excluded. Prostate gland is not enlarged. No pelvic mass or lymphadenopathy. Appendix is not identified and may be surgically absent. Small periumbilical hernia containing fat and small bowel. Proximal small bowel are contrast filled with upper limits of  normal caliber, measuring about 2.9 cm diameter. Distal small bowel loops are decompressed. Early or partial obstruction not entirely excluded. Degenerative changes in the lumbar spine. No destructive bone lesions appreciated.  IMPRESSION: Small amount of free fluid in the upper abdomen and pelvis with minimal free air demonstrated, likely related to pelvic peritoneal dialysis catheter. Small periumbilical hernia containing fat and small bowel. Contrast filled bowel demonstrate upper limits of normal caliber. Distal bowel are decompressed. Early or partial small bowel obstruction not excluded.   Electronically Signed   By: Lucienne Capers M.D.   On: 09/02/2014 02:01   Dg Abd Acute W/chest  09/01/2014   CLINICAL DATA:  Elevated blood pressure, lower abdominal pain  EXAM: ACUTE ABDOMEN  SERIES (ABDOMEN 2 VIEW & CHEST 1 VIEW)  COMPARISON:  03/15/2014  FINDINGS: Cardiomediastinal silhouette is stable. No acute infiltrate or pleural effusion. No pulmonary edema. There is nonspecific nonobstructive bowel gas pattern. Peritoneal dialysis catheter is noted. Moderate stool and nonspecific air-fluid level noted in right colon.  IMPRESSION: Negative abdominal radiographs.  No acute cardiopulmonary disease.   Electronically Signed   By: Lahoma Crocker M.D.   On: 09/01/2014 20:17   Results/Tests Pending at Time of Discharge: none  Discharge Medications:    Medication List    STOP taking these medications        calcitRIOL 0.25 MCG capsule  Commonly known as:  ROCALTROL     gentamicin cream 0.1 %  Commonly known as:  GARAMYCIN     HYDROcodone-acetaminophen 7.5-325 MG per tablet  Commonly known as:  NORCO      TAKE these medications        albuterol 108 (90 BASE) MCG/ACT inhaler  Commonly known as:  PROVENTIL HFA;VENTOLIN HFA  Inhale 2 puffs into the lungs every 4 (four) hours as needed for wheezing. For wheezing     atorvastatin 40 MG tablet  Commonly known as:  LIPITOR  Take 40 mg by mouth at bedtime.     carvedilol 25 MG tablet  Commonly known as:  COREG  Take 37.5 mg by mouth 2 (two) times daily with a meal.     clopidogrel 75 MG tablet  Commonly known as:  PLAVIX  Take 75 mg by mouth daily.     fluticasone 50 MCG/ACT nasal spray  Commonly known as:  FLONASE  Place 2 sprays into both nostrils daily.     hydrALAZINE 50 MG tablet  Commonly known as:  APRESOLINE  Take 50 mg by mouth 3 (three) times daily.     hydrocortisone 2.5 % rectal cream  Commonly known as:  ANUSOL-HC  Apply topically 4 (four) times daily.     hydrocortisone-pramoxine rectal foam  Commonly known as:  PROCTOFOAM-HC  Place 1 applicator rectally 2 (two) times daily.     HYDROmorphone 2 MG tablet  Commonly known as:  DILAUDID  Take 0.5 tablets (1 mg total) by mouth every 2 (two) hours as  needed for severe pain.     lanthanum 1000 MG chewable tablet  Commonly known as:  FOSRENOL  Chew 1,000-2,000 mg by mouth 3 (three) times daily with meals. Takes 2 tabs with meals and 1-2 tabs with snacks     LEVEMIR 100 UNIT/ML injection  Generic drug:  insulin detemir  Inject 75 Units into the skin 2 (two) times daily.     polyethylene glycol packet  Commonly known as:  MIRALAX / GLYCOLAX  Take 17 g by mouth daily.     tiotropium 18  MCG inhalation capsule  Commonly known as:  SPIRIVA  Place 18 mcg into inhaler and inhale daily as needed (for shortness of breath).     tobramycin 0.3 % ophthalmic solution  Commonly known as:  TOBREX  Place 1 drop into both eyes every 6 (six) hours as needed (for eye irritation).        Discharge Instructions: Please refer to Patient Instructions section of EMR for full details.  Patient was counseled important signs and symptoms that should prompt return to medical care, changes in medications, dietary instructions, activity restrictions, and follow up appointments.   Follow-Up Appointments: Follow-up Information    Follow up with Thersa Salt, DO On 09/13/2014.   Specialty:  Family Medicine   Why:  4pm (hospital follow up)   Contact information:   [place] [address] [phone]       Follow up with CCS,MD, MD On 09/15/2014.   Specialty:  General Surgery   Why:  For suture removal:  nurses only visit      Janora Norlander, DO 09/11/2014, 7:01 PM PGY-1, [facility]

## 2014-09-08 NOTE — Progress Notes (Signed)
Family Medicine Teaching Service Daily Progress Note Intern Pager: [phone]  Patient name: Jonathon Bailey Medical record number: [id number] Date of birth: [birth year] Age: 58 y.o. Gender: male  Primary Care Provider: Thersa Salt, DO Consultants: Nephrology, Surgery Code Status: FULL (discussed on admission)  Pt Overview and Major Events to Date:  09/01/14: Patient with FOBT+ 09/02/14: NGT placed for decompression 09/03/14: Likely to OR today 09/05/14: 5 beat Vtach overnt, patient asx. Flex sigmoid today. Patient self removed NGT this am. 09/06/14: HD this am. Sx removed PD.  Assessment and Plan: Jonathon Bailey is a 58 y.o. male presenting with generalized abdominal pain . PMH is significant for ESRD with peritoneal dialysis, T2DM, CAD, HTN, Chronic systolic and diastolic HF, h/o CVA, COPD  Abdominal pain: Peritoneal fluid cloudy and fever to 102F very suspicious for peritonitis. Abdominal xray with moderate stool burden but no acute findings. LA: 1.82>1.06. hgb 6.2 on admission (baseline 7-8). Pt meets sepsis criteria with source of infection, tachycardia, tachypnea, and fever.  CT abdomen pelvis with small amount of free air. Cytology with leukocytosis and intracellular organisms, no malignant cells identified. FluidCx Enterobacter cloacae. Blood cx negative    -Continue Fortaz  -Percocet 5 q4 PRN pain, will consider adding Oxy IR 5 PRN breakthrough pain if pressures stable -Continue to monitor vitals  -keep on telemetry -c/s surgery, appreciate recs: PD cath removed 12/14 -c/s to renal, appreciate recs: HD cath placed, Patient can go home with tunnel cath when ready for discharge -PT to evaluate today  Anemia: Baseline Hgb 7-8, on admission Hgb of 6.2. FOBT+, transfused 1 unit pRBCs 09/02/14. Post transfusion hgb 7.1. Hgb today 8.2. Had colonoscopy in 2009 with no abnormalities noted. Flex sig with possible necrotic hemorrhoid.  -Continue Aranesp -monitor hgb  closely -Continue proctofoam, Miralax -hold home plavix for now, restart when able  ESRD: Receives daily peritoneal HD. Sees Dr Elroy Channel. Missed Sun and Tues sessions. Cr: 9.04 today (baseline 10-11). Slightly acidotic on VBG pH 7.316. S/p 1 amp bicarb -HD today. -Continue Fosrenol -holding garamycin   Tachycardia: Stable. HR 80-90's overnight. EKG: RBBB, LPFB. Troponin negative x1.    -management as above -Continue home Coreg  Chronic systolic and diastolic HF: ECHO 123456: EF 40-45%. -Continue BB -KVO fluids  HTN: stable AM BP 127/80.  -Continue home Coreg, Hydralazine  CAD & H/o CVA.: Patient without signs of ischemia on exam. Neurologically in tact.  -Continue home Lipitor, Coreg -Plavix restarted 12/15  T2DM: Last A1c: 7.4. On home levemir 75 u BID -Levemir reduced dose @35  units BID -SSI, renal  COPD: -Continue Albuterol PRN & Spiriva daily  FEN/GI: KVO, Carb modified diet PPx: SCDs (no heparin d/t GI bleed)  Disposition: Dispo pending surgery, renal, PT recs.  Likely tomorrow depending on mobility.  Subjective:  Patient reports that pain is ok this morning.  He states that he wants more PRN medication.  He wants to "sleep through the pain".  He is wondering why he can't have more percocet more often.  I explained that there is a lot of tylenol with that kind of dosing and that his pressures have been intermittently soft.  He acknowledges this.  Patient reports that he continues to have flatus.  He is otherwise doing well.  Objective: Temp:  [97.6 F (36.4 C)-98.4 F (36.9 C)] 98.1 F (36.7 C) (12/16 0753) Pulse Rate:  [85-95] 93 (12/16 0859) Resp:  [14-33] 16 (12/16 0859) BP: (98-151)/(43-83) 127/80 mmHg (12/16 0859) SpO2:  [88 %-98 %] 97 % (12/16  [id number]) Weight:  [274 lb 11.1 oz (124.6 kg)-277 lb 12.5 oz (126 kg)] 277 lb 12.5 oz (126 kg) (12/16 0753) Physical Exam: General: awake, alert, pleasant male, lying in bed undergoing HD, NAD, Renal provider at  bedside HEENT: [place]/AT, MMM Cardiovascular: RRR, brisk cap refill Respiratory: (hard to auscultate with sound of HD machine) but seemingly CTAB, no increased WOB Abdomen: protuberant, mildly distended, TTP over surgical sites (x4 with staples) and at RLQ, +bowel sounds X4 Extremities: WWP, no edema, clubbing or cyanosis. Psych: good eye contact, normal speech, AAOx3  Laboratory:  Recent Labs Lab 09/06/14 0305 09/07/14 0843 09/08/14 0730  WBC 11.9* 12.5* 14.0*  HGB 7.8* 8.6* 8.2*  HCT 24.9* 28.6* 25.6*  PLT 244 308 295    Recent Labs Lab 09/01/14 1808  09/06/14 0305 09/07/14 0843 09/08/14 0730  NA 138  < > 138 138 136*  K 4.2  < > 3.9 4.3 4.1  CL 97  < > 97 97 94*  CO2 16*  < > 24 23 23   BUN 94*  < > 67* 46* 59*  CREATININE 11.51*  < > 8.70* 7.04* 9.04*  CALCIUM 9.2  < > 9.1 9.3 9.3  PROT 7.5  --   --   --   --   BILITOT 0.4  --   --   --   --   ALKPHOS 65  --   --   --   --   ALT 13  --   --   --   --   AST 10  --   --   --   --   GLUCOSE 292*  < > 123* 150* 101*  < > = values in this interval not displayed.  Imaging/Diagnostic Tests:  No results found.  Janora Norlander, DO 09/08/2014, 9:16 AM PGY-1, [place] Intern pager: [phone], text pages welcome

## 2014-09-09 DIAGNOSIS — R509 Fever, unspecified: Secondary | ICD-10-CM | POA: Insufficient documentation

## 2014-09-09 DIAGNOSIS — E118 Type 2 diabetes mellitus with unspecified complications: Secondary | ICD-10-CM

## 2014-09-09 LAB — BASIC METABOLIC PANEL WITH GFR
Anion gap: 16 — ABNORMAL HIGH (ref 5–15)
BUN: 33 mg/dL — ABNORMAL HIGH (ref 6–23)
CO2: 24 meq/L (ref 19–32)
Calcium: 9.1 mg/dL (ref 8.4–10.5)
Chloride: 95 meq/L — ABNORMAL LOW (ref 96–112)
Creatinine, Ser: 7.01 mg/dL — ABNORMAL HIGH (ref 0.50–1.35)
GFR calc Af Amer: 9 mL/min — ABNORMAL LOW
GFR calc non Af Amer: 8 mL/min — ABNORMAL LOW
Glucose, Bld: 174 mg/dL — ABNORMAL HIGH (ref 70–99)
Potassium: 4.1 meq/L (ref 3.7–5.3)
Sodium: 135 meq/L — ABNORMAL LOW (ref 137–147)

## 2014-09-09 LAB — CBC
HCT: 26.5 % — ABNORMAL LOW (ref 39.0–52.0)
Hemoglobin: 8.1 g/dL — ABNORMAL LOW (ref 13.0–17.0)
MCH: 29.1 pg (ref 26.0–34.0)
MCHC: 30.6 g/dL (ref 30.0–36.0)
MCV: 95.3 fL (ref 78.0–100.0)
Platelets: 309 K/uL (ref 150–400)
RBC: 2.78 MIL/uL — ABNORMAL LOW (ref 4.22–5.81)
RDW: 14.7 % (ref 11.5–15.5)
WBC: 13.5 K/uL — ABNORMAL HIGH (ref 4.0–10.5)

## 2014-09-09 LAB — GLUCOSE, CAPILLARY
GLUCOSE-CAPILLARY: 162 mg/dL — AB (ref 70–99)
Glucose-Capillary: 56 mg/dL — ABNORMAL LOW (ref 70–99)
Glucose-Capillary: 178 mg/dL — ABNORMAL HIGH (ref 70–99)
Glucose-Capillary: 188 mg/dL — ABNORMAL HIGH (ref 70–99)

## 2014-09-09 LAB — AMMONIA: AMMONIA: 30 umol/L (ref 11–60)

## 2014-09-09 MED ORDER — DEXTROSE 5 % IV SOLN
1.0000 g | INTRAVENOUS | Status: DC
  Administered 2014-09-10: 1 g via INTRAVENOUS
  Filled 2014-09-09: qty 1

## 2014-09-09 MED ORDER — DEXTROSE 5 % IV SOLN: 2.0000 g | INTRAVENOUS | Status: DC

## 2014-09-09 MED ORDER — DEXTROSE 5 % IV SOLN
2.0000 g | INTRAVENOUS | Status: AC
  Administered 2014-09-09: 2 g via INTRAVENOUS
  Filled 2014-09-09: qty 2

## 2014-09-09 NOTE — Progress Notes (Signed)
[facility] Progress Note  Subjective: No new complaints, abd still hurts but he will not be specific about how bad, better or worse, etc.  No nausea, "passing gas" which may be new.  Vague historian.   Physical Exam General: talkative, NAD Heart: RRR Lungs: no rales Abdomen: soft + BS sore Extremities: no sig edema Dialysis Access:  Right IJ  Dialysis Orders: Was PD, now HD, s/p TDC by IR. MWF schedule here. CLIP pending  Assessment: 1. PD associated gram negative (E. Cloacae) peritonitis - s/p PD cath removal 12/15, POD #2. Still quite tender over abdomen, spiked temp to 102 last night. Not toxic in appearance.  Will need CT abd if fevers / abd pain don't improve next 24-48 hrs.  On IV Fortaz 2 gm TIW, since pt is spiking temps will change to 1 gm IV daily for now.  2. ESRD on HD, s/p new TDC, clipped to [facility] 3. Anemia Hb 8.2 stable, on max ESA, tsat 10%, started IV iron load but will stop w infection issues for now 4. MBD cont fosrenol, po vit D- need to convert to hectorol at d/c 5. HTN/vol - on hydral / coreg, wt's down, no vol excess on exam. At dry wt, BP's normal 6. DM on insulin- per primary 7. Vascular access - pt refuses any consideration of placing AVF/AVG, he has refused in the past, Dr Jonathon Bailey is aware.    Plan - change Jonathon Bailey to daily, stop IV iron, watch abd exam and consider CT.  HD Friday.   Jonathon Splinter MD pager [phone]    cell [phone] 09/09/2014, 10:53 AM    Subjective:   Refuses avf Objective Filed Vitals:   09/09/14 0535 09/09/14 0832 09/09/14 0858 09/09/14 0948  BP: 132/73  120/67   Pulse:  82 88 113  Temp:   98.4 F (36.9 C)   TempSrc:   Axillary   Resp:  17 21   Height:      Weight:      SpO2:  94% 98% 94%     Additional Objective Labs: Basic Metabolic Panel:  Recent Labs Lab 09/04/14 1550 09/05/14 0540 09/06/14 0305 09/07/14 0843 09/08/14 0730  NA 142 143 138 138 136*  K 3.9 3.9 3.9 4.3 4.1  CL 98 99 97 97  94*  CO2 21 21 24 23 23   GLUCOSE 146* 115* 123* 150* 101*  BUN 97* 58* 67* 46* 59*  CREATININE 11.23* 7.92* 8.70* 7.04* 9.04*  CALCIUM 8.8 8.8 9.1 9.3 9.3  PHOS 7.5* 5.8*  --   --   --    Liver Function Tests:  Recent Labs Lab 09/04/14 1550 09/05/14 0540  ALBUMIN 2.4* 2.4*   CBC:  Recent Labs Lab 09/04/14 0905 09/04/14 2132 09/05/14 0258 09/06/14 0305 09/07/14 0843 09/08/14 0730  WBC 11.3* 12.0* 12.5* 11.9* 12.5* 14.0*  NEUTROABS 8.3*  --   --   --   --   --   HGB 7.1* 8.1* 8.0* 7.8* 8.6* 8.2*  HCT 22.1* 25.4* 25.2* 24.9* 28.6* 25.6*  MCV 93.2 92.7 93.0 93.3 95.7 97.0  PLT 228 242 252 244 308 295   Blood Culture    Component Value Date/Time   SDES FLUID PERITONEAL 09/03/2014 1350   SPECREQUEST NONE 09/03/2014 1350   CULT  09/02/2014 0159    INSIGNIFICANT GROWTH Performed at [place] 09/03/2014 FINAL 09/03/2014 1350    Cardiac Enzymes:  Recent Labs Lab 09/02/14 1316  TROPONINI <0.30  CBG:  Recent Labs Lab 09/07/14 2120 09/08/14 1217 09/08/14 1705 09/08/14 2152 09/09/14 0857  GLUCAP 148* 135* 135* 101* 56*  Medications:   . antiseptic oral rinse  7 mL Mouth Rinse BID  . atorvastatin  40 mg Oral q1800  . calcitRIOL  0.25 mcg Oral Daily  . carvedilol  37.5 mg Oral BID WC  . cefTAZidime (FORTAZ)  IV  2 g Intravenous Q M,W,F-HD  . clopidogrel  75 mg Oral Daily  . darbepoetin (ARANESP) injection - NON-DIALYSIS  200 mcg Subcutaneous Q Thu-1800  . ferric gluconate (FERRLECIT/NULECIT) IV  125 mg Intravenous Q M,W,F-HD  . fluticasone  2 spray Each Nare Daily  . hydrALAZINE  50 mg Oral 3 times per day  . hydrocortisone   Topical QID  . hydrocortisone-pramoxine  1 applicator Rectal BID  . insulin aspart  0-9 Units Subcutaneous TID WC  . insulin aspart  3 Units Subcutaneous TID WC  . insulin detemir  35 Units Subcutaneous BID  . lanthanum  1,000 mg Oral TID WC  . polyethylene glycol  17 g Oral Daily  . sodium chloride  3 mL  Intravenous Q12H  . tiotropium  18 mcg Inhalation Daily

## 2014-09-09 NOTE — Progress Notes (Signed)
Family Medicine Teaching Service Daily Progress Note Intern Pager: [phone]  Patient name: Jonathon Bailey Medical record number: [id number] Date of birth: 27-Jan-1956 Age: 58 y.o. Gender: male  Primary Care Provider: Thersa Salt, DO Consultants: Nephrology, Surgery Code Status: FULL (discussed on admission)  Pt Overview and Major Events to Date:  09/01/14: Patient with FOBT+ 09/02/14: NGT placed for decompression 09/03/14: Likely to OR today 09/05/14: 5 beat Vtach overnt, patient asx. Flex sigmoid today. Patient self removed NGT this am. 09/06/14: HD this am. Sx removed PD. 09/08/14: Spiked fever overnight  Assessment and Plan: AJAVION FELTUS is a 58 y.o. male presenting with generalized abdominal pain . PMH is significant for ESRD with peritoneal dialysis, T2DM, CAD, HTN, Chronic systolic and diastolic HF, h/o CVA, COPD  Fever: Temperature to 102.1 on 12/16.  Defervesced without medication. T currently 97.22F -Continue to monitor -Will obtain CT abdomen if not improved in 24-48 hours. -Tressie Ellis to be scheduled daily per Renal -Will follow abdominal exam closely -CBC, BMET ordered  Abdominal pain: Peritoneal fluid cloudy and fever to 102F very suspicious for peritonitis. Abdominal xray with moderate stool burden but no acute findings. LA: 1.82>1.06. hgb 6.2 on admission (baseline 7-8). Pt meets sepsis criteria with source of infection, tachycardia, tachypnea, and fever.  CT abdomen pelvis with small amount of free air. Cytology with leukocytosis and intracellular organisms, no malignant cells identified. FluidCx Enterobacter cloacae. Blood cx negative   -HD tomorrow.  -Continue Tressie Ellis. Will give qd in setting of fever  -Dilaudid 1mg  q2 PRN pain -Continue to monitor vitals  -keep on telemetry -c/s surgery, appreciate recs: PD cath removed 12/14 -c/s to renal, appreciate recs: HD cath placed, Patient can go home with tunnel cath when ready for discharge. HD in place for  patient -PT to evaluate today   Anemia: Baseline Hgb 7-8, on admission Hgb of 6.2. FOBT+, transfused 1 unit pRBCs 09/02/14. Post transfusion hgb 7.1. Had colonoscopy in 2009 with no abnormalities noted. Flex sig with possible necrotic hemorrhoid.  -Hold Iron per renal -monitor hgb closely -Continue proctofoam, Miralax -hold home plavix for now, restart when able  ESRD: Receives daily peritoneal HD. Sees Dr Elroy Channel. Missed Sun and Tues sessions. Cr: pending this morning (baseline 10-11). Slightly acidotic on VBG pH 7.316. S/p 1 amp bicarb -HD today. -Continue Fosrenol -holding garamycin   Tachycardia: Stable. HR 90-100's overnight. EKG: RBBB, LPFB. Troponin negative x1.    -management as above -Continue home Coreg  Chronic systolic and diastolic HF: ECHO 123456: EF 40-45%. -Continue BB -KVO fluids  HTN: stable AM BP 126/58.  -Continue home Coreg, Hydralazine  CAD & H/o CVA.: Patient without signs of ischemia on exam. Neurologically in tact.  -Continue home Lipitor, Coreg -Plavix restarted 12/15  T2DM: Last A1c: 7.4. On home levemir 75 u BID -Levemir reduced dose @35  units BID -SSI, renal  COPD: -Continue Albuterol PRN & Spiriva daily  FEN/GI: KVO, Carb modified diet PPx: SCDs (no heparin d/t GI bleed)  Disposition: Dispo pending surgery, renal, PT recs.  Likely tomorrow depending on mobility.  Subjective:  Patient reports that pain is ok this morning.  He denies any concerns at this time.  Denies rectal bleeding.  Objective: Temp:  [97.7 F (36.5 C)-102.1 F (38.9 C)] 97.7 F (36.5 C) (12/17 1152) Pulse Rate:  [82-113] 84 (12/17 1152) Resp:  [16-34] 16 (12/17 1152) BP: (98-148)/(57-73) 126/58 mmHg (12/17 1152) SpO2:  [88 %-99 %] 94 % (12/17 1152) Weight:  [275 lb 9.2 oz (125 kg)-276 lb  0.3 oz (125.2 kg)] 276 lb 0.3 oz (125.2 kg) (12/17 0300) Physical Exam: General: sleeping in bed, NAD, RT at bedside HEENT: [place]/AT, MMM Cardiovascular: RRR, brisk cap  refill Respiratory: CTAB, no increased WOB Abdomen: protuberant, mildly distended, TTP over surgical sites (x4 with staples, healing non infected appearing) and at RLQ, +bowel sounds X4 Extremities: WWP, no edema, clubbing or cyanosis. Psych: sleepy, normal speech, responds to commands  Laboratory:  Recent Labs Lab 09/06/14 0305 09/07/14 0843 09/08/14 0730  WBC 11.9* 12.5* 14.0*  HGB 7.8* 8.6* 8.2*  HCT 24.9* 28.6* 25.6*  PLT 244 308 295    Recent Labs Lab 09/06/14 0305 09/07/14 0843 09/08/14 0730  NA 138 138 136*  K 3.9 4.3 4.1  CL 97 97 94*  CO2 24 23 23   BUN 67* 46* 59*  CREATININE 8.70* 7.04* 9.04*  CALCIUM 9.1 9.3 9.3  GLUCOSE 123* 150* 101*    Imaging/Diagnostic Tests:  No results found.  Janora Norlander, DO 09/09/2014, 12:00 PM PGY-1, [place] Intern pager: [phone], text pages welcome

## 2014-09-09 NOTE — Progress Notes (Addendum)
**  Interval Note**  Visited patient to repeat abdominal exam this afternoon.  Patient reports that he is only getting 1/2 tablet of pain medicine at a time.  In addition, patient reports continued flatus.  RN reports fecal smears with small BMs, non bloody.  Reviewed pain medication with RN present in room and she reports that patient is indeed getting 1mg  of Dilaudid.  RN reports that she thinks patient's speech is more slurred than a few days ago.  Patient on exam does exhibit slightly slurred speech, no other neurological focal deficits appreciated, AAOx3, abdominal exam stable with mild erythema at staple sites but no exudate, +TTP particularly in RLQ and around staple sites, belly is protuberant with mild distension but no signs of acute abdomen.  RN also reports to me that patient seems to be not taking control of his own healthcare and is borderline inappropriate with the staff, insisting that the RNs apply rectal ointments prescribed by GI.  I addressed this matter with patient.  Patient is mobile and verbally agrees that he will apply cream to rectum in preparation for discharge within the next few days.  Will also obtain Ammonia level in setting of slurred speech with no focal deficits and poor renal function/poor BM.  However, likely speech is due to pain medication.  Will monitor closely and decrease to Dilaudid 0.5mg  if needed.  Jonathon Perham M. Lajuana Ripple, DO PGY-1, [place]

## 2014-09-09 NOTE — Progress Notes (Signed)
Patient ID: Jonathon Bailey, male   DOB: 1956/01/29, 58 y.o.   MRN: [id number] 3 Days Post-Op  Subjective: Pt feels well.  Sore in abdomen  Objective: Vital signs in last 24 hours: Temp:  [98.5 F (36.9 C)-102.1 F (38.9 C)] 99.9 F (37.7 C) (12/17 0300) Pulse Rate:  [90-106] 101 (12/17 0500) Resp:  [14-34] 20 (12/17 0500) BP: (98-151)/(57-83) 132/73 mmHg (12/17 0535) SpO2:  [88 %-99 %] 95 % (12/17 0500) Weight:  [275 lb 9.2 oz (125 kg)-276 lb 0.3 oz (125.2 kg)] 276 lb 0.3 oz (125.2 kg) (12/17 0300) Last BM Date: 09/07/14  Intake/Output from previous day: 12/16 0701 - 12/17 0700 In: 760 [P.O.:600; IV Piggyback:160] Out: 1000  Intake/Output this shift:    PE: Abd: soft, appropriately tender, +BS, obese, incisions c/d/i  Lab Results:   Recent Labs  09/07/14 0843 09/08/14 0730  WBC 12.5* 14.0*  HGB 8.6* 8.2*  HCT 28.6* 25.6*  PLT 308 295   BMET  Recent Labs  09/07/14 0843 09/08/14 0730  NA 138 136*  K 4.3 4.1  CL 97 94*  CO2 23 23  GLUCOSE 150* 101*  BUN 46* 59*  CREATININE 7.04* 9.04*  CALCIUM 9.3 9.3   PT/INR No results for input(s): LABPROT, INR in the last 72 hours. CMP     Component Value Date/Time   NA 136* 09/08/2014 0730   K 4.1 09/08/2014 0730   CL 94* 09/08/2014 0730   CO2 23 09/08/2014 0730   GLUCOSE 101* 09/08/2014 0730   BUN 59* 09/08/2014 0730   CREATININE 9.04* 09/08/2014 0730   CREATININE 7.17* 10/23/2012 1554   CALCIUM 9.3 09/08/2014 0730   PROT 7.5 09/01/2014 1808   ALBUMIN 2.4* 09/05/2014 0540   AST 10 09/01/2014 1808   ALT 13 09/01/2014 1808   ALKPHOS 65 09/01/2014 1808   BILITOT 0.4 09/01/2014 1808   GFRNONAA 6* 09/08/2014 0730   GFRAA 7* 09/08/2014 0730   Lipase     Component Value Date/Time   LIPASE 28 01/16/2010 1037       Studies/Results: No results found.  Anti-infectives: Anti-infectives    Start     Dose/Rate Route Frequency Ordered Stop   09/08/14 1200  cefTAZidime (FORTAZ) 2 g in dextrose 5 % 50 mL  IVPB     2 g100 mL/hr over 30 Minutes Intravenous Every M-W-F (Hemodialysis) 09/08/14 0959     09/08/14 1000  cefTAZidime (FORTAZ) 2 g in dextrose 5 % 50 mL IVPB  Status:  Discontinued    Comments:  Give after dialysis   2 g100 mL/hr over 30 Minutes Intravenous Every 48 hours 09/08/14 0837 09/08/14 0944   09/08/14 1000  cefTAZidime (FORTAZ) 2 g in dextrose 5 % 50 mL IVPB  Status:  Discontinued     2 g100 mL/hr over 30 Minutes Intravenous Every M-W-F 09/08/14 0956 09/08/14 0959   09/08/14 0944  cefTAZidime (FORTAZ) 2 g in dextrose 5 % 50 mL IVPB  Status:  Discontinued    Comments:  Give after dialysis   2 g100 mL/hr over 30 Minutes Intravenous Every Dialysis 09/08/14 0944 09/08/14 0956   09/04/14 1030  ceFAZolin (ANCEF) IVPB 2 g/50 mL premix    Comments:  Hang ON CALL to xray today   2 g100 mL/hr over 30 Minutes Intravenous On call 09/04/14 0944 09/04/14 1141   09/04/14 1030  cefTAZidime (FORTAZ) 2 g in dextrose 5 % 50 mL IVPB  Status:  Discontinued     2 g100 mL/hr over 30  Minutes Intravenous Every 24 hours 09/04/14 0950 09/08/14 0837   09/02/14 0200  piperacillin-tazobactam (ZOSYN) IVPB 2.25 g  Status:  Discontinued     2.25 g100 mL/hr over 30 Minutes Intravenous Every 8 hours 09/01/14 1842 09/04/14 0950   09/01/14 1830  piperacillin-tazobactam (ZOSYN) IVPB 2.25 g     2.25 g100 mL/hr over 30 Minutes Intravenous NOW 09/01/14 1824 09/01/14 1930   09/01/14 1829  vancomycin (VANCOCIN) 2,500 mg in sodium chloride 0.9 % 250 mL IVPB  Status:  Discontinued     2,500 mg250 mL/hr over 60 Minutes Intravenous Every Dialysis 09/01/14 1829 09/02/14 0855   09/01/14 1815  piperacillin-tazobactam (ZOSYN) IVPB 3.375 g  Status:  Discontinued     3.375 g100 mL/hr over 30 Minutes Intravenous  Once 09/01/14 1803 09/01/14 1824   09/01/14 1815  vancomycin (VANCOCIN) IVPB 1000 mg/200 mL premix  Status:  Discontinued     1,000 mg200 mL/hr over 60 Minutes Intravenous  Once 09/01/14 1803 09/01/14 1830        Assessment/Plan  1. POD 3 s/p removal of PD cath  Plan: 1. Cont current care.  Will remove staples on POD7-10   LOS: 8 days    Jonathon Bailey E 09/09/2014, 8:04 AM Pager: [phone]

## 2014-09-09 NOTE — Evaluation (Signed)
Physical Therapy Evaluation Patient Details Name: Jonathon Bailey MRN: [id number] DOB: 06-Oct-1955 Today's Date: 09/09/2014   History of Present Illness  Pt adm with peritoneal dialysis associated gram negative (E. Cloacae) peritonitis w associated catheter malfunction. Pt with removal of PD catheter on 09/07/14. Pt now on HD. PMH - ESRD, HTN, DM  Clinical Impression  Pt presents to PT with deconditioning due to inactivity during hospitalization. His wife has recently passed away and he will be alone during the day. Could benefit from increased ambulation and activity prior to return home. If he is to dc today he will need to have incr family support for the next few days.    Follow Up Recommendations Home health PT    Equipment Recommendations  None recommended by PT    Recommendations for Other Services       Precautions / Restrictions Precautions Precautions: Fall      Mobility  Bed Mobility               General bed mobility comments: Pt up in chair.  Transfers Overall transfer level: Needs assistance Equipment used: Rolling walker (2 wheeled);Straight cane Transfers: Sit to/from Stand Sit to Stand: Supervision            Ambulation/Gait Ambulation/Gait assistance: Min assist Ambulation Distance (Feet): 130 Feet Assistive device: Rolling walker (2 wheeled);Straight cane Gait Pattern/deviations: Step-through pattern;Wide base of support   Gait velocity interpretation: Below normal speed for age/gender General Gait Details: Pt with slightly unsteady gait. Pt did better with rolling walker.  Stairs            Wheelchair Mobility    Modified Rankin (Stroke Patients Only)       Balance Overall balance assessment: Needs assistance Sitting-balance support: No upper extremity supported;Feet supported Sitting balance-Leahy Scale: Good     Standing balance support: No upper extremity supported Standing balance-Leahy Scale: Fair                               Pertinent Vitals/Pain Pain Assessment: 0-10 Pain Score: 4  Pain Location: Rt side abdomen Pain Intervention(s): Premedicated before session;Repositioned    Home Living Family/patient expects to be discharged to:: Private residence Living Arrangements: Children Available Help at Discharge: Family;Available PRN/intermittently Type of Home: House Home Access: Ramped entrance     Home Layout: Multi-level Home Equipment: Walker - 2 wheels;Cane - single point      Prior Function Level of Independence: Independent with assistive device(s)         Comments: Amb with straight cane at times.     Hand Dominance   Dominant Hand: Right    Extremity/Trunk Assessment   Upper Extremity Assessment: Generalized weakness           Lower Extremity Assessment: Generalized weakness         Communication   Communication: No difficulties  Cognition Arousal/Alertness: Awake/alert Behavior During Therapy: WFL for tasks assessed/performed Overall Cognitive Status: Within Functional Limits for tasks assessed                      General Comments      Exercises        Assessment/Plan    PT Assessment Patient needs continued PT services  PT Diagnosis Difficulty walking;Generalized weakness   PT Problem List Decreased strength;Decreased activity tolerance;Decreased balance;Decreased mobility  PT Treatment Interventions DME instruction;Gait training;Functional mobility training;Therapeutic activities;Therapeutic exercise;Balance training;Patient/family education  PT Goals (Current goals can be found in the Care Plan section) Acute Rehab PT Goals Patient Stated Goal: Get stronger PT Goal Formulation: With patient Time For Goal Achievement: 09/16/14 Potential to Achieve Goals: Good    Frequency Min 3X/week   Barriers to discharge Decreased caregiver support      Co-evaluation               End of Session   Activity  Tolerance: Patient limited by fatigue Patient left: in chair;with call bell/phone within reach Nurse Communication: Mobility status         Time: 0910-0940 PT Time Calculation (min) (ACUTE ONLY): 30 min   Charges:   PT Evaluation $Initial PT Evaluation Tier I: 1 Procedure PT Treatments $Gait Training: 8-22 mins   PT G Codes:          Maysen Sudol 2014/09/12, 9:59 AM  [facility] PT [phone]

## 2014-09-09 NOTE — Progress Notes (Signed)
Family Practice Teaching Service Interval Progress Note  Note entered in delay. Patient seen at 7:30 pm. Jonathon Bailey to see patient to follow-up on abdominal exam. He reports his pain is not worse than previously. He notes it is improved from admission. His abdomen is soft, mildly tender throughout with no guarding or rebound. He has no slurring of speech at this time and appears to be alert and able to move all extremities. Patient notes his wife passed away recently and he was afraid to initially come to the hospital as it would potentially scare his 32 yo son, thus he delayed coming in to get treated. He was tearful through this conversation and I provided support to him. Will continue to monitor him tonight.   Tommi Rumps, MD Family Medicine PGY-3 Service Pager [phone]

## 2014-09-10 DIAGNOSIS — K659 Peritonitis, unspecified: Secondary | ICD-10-CM | POA: Insufficient documentation

## 2014-09-10 DIAGNOSIS — R52 Pain, unspecified: Secondary | ICD-10-CM | POA: Insufficient documentation

## 2014-09-10 DIAGNOSIS — M1990 Unspecified osteoarthritis, unspecified site: Secondary | ICD-10-CM | POA: Insufficient documentation

## 2014-09-10 DIAGNOSIS — F329 Major depressive disorder, single episode, unspecified: Secondary | ICD-10-CM | POA: Insufficient documentation

## 2014-09-10 DIAGNOSIS — N2581 Secondary hyperparathyroidism of renal origin: Secondary | ICD-10-CM | POA: Insufficient documentation

## 2014-09-10 DIAGNOSIS — D689 Coagulation defect, unspecified: Secondary | ICD-10-CM | POA: Insufficient documentation

## 2014-09-10 DIAGNOSIS — L299 Pruritus, unspecified: Secondary | ICD-10-CM | POA: Insufficient documentation

## 2014-09-10 DIAGNOSIS — F419 Anxiety disorder, unspecified: Secondary | ICD-10-CM | POA: Insufficient documentation

## 2014-09-10 DIAGNOSIS — D509 Iron deficiency anemia, unspecified: Secondary | ICD-10-CM | POA: Insufficient documentation

## 2014-09-10 LAB — BASIC METABOLIC PANEL
ANION GAP: 17 — AB (ref 5–15)
BUN: 42 mg/dL — AB (ref 6–23)
CHLORIDE: 93 meq/L — AB (ref 96–112)
CO2: 23 meq/L (ref 19–32)
Calcium: 9.1 mg/dL (ref 8.4–10.5)
Creatinine, Ser: 8.12 mg/dL — ABNORMAL HIGH (ref 0.50–1.35)
GFR calc Af Amer: 8 mL/min — ABNORMAL LOW (ref >90)
GFR calc non Af Amer: 6 mL/min — ABNORMAL LOW (ref >90)
Glucose, Bld: 220 mg/dL — ABNORMAL HIGH (ref 70–99)
POTASSIUM: 4.7 meq/L (ref 3.7–5.3)
Sodium: 133 mEq/L — ABNORMAL LOW (ref 137–147)

## 2014-09-10 LAB — CBC
HEMATOCRIT: 24.9 % — AB (ref 39.0–52.0)
Hemoglobin: 7.8 g/dL — ABNORMAL LOW (ref 13.0–17.0)
MCH: 29.5 pg (ref 26.0–34.0)
MCHC: 31.3 g/dL (ref 30.0–36.0)
MCV: 94.3 fL (ref 78.0–100.0)
PLATELETS: 419 10*3/uL — AB (ref 150–400)
RBC: 2.64 MIL/uL — AB (ref 4.22–5.81)
RDW: 14.8 % (ref 11.5–15.5)
WBC: 12.4 10*3/uL — AB (ref 4.0–10.5)

## 2014-09-10 LAB — GLUCOSE, CAPILLARY
GLUCOSE-CAPILLARY: 97 mg/dL (ref 70–99)
Glucose-Capillary: 122 mg/dL — ABNORMAL HIGH (ref 70–99)

## 2014-09-10 MED ORDER — HYDRALAZINE HCL 50 MG PO TABS
50.0000 mg | ORAL_TABLET | Freq: Three times a day (TID) | ORAL | Status: DC
  Administered 2014-09-10 (×2): 50 mg via ORAL
  Filled 2014-09-10 (×4): qty 1

## 2014-09-10 MED ORDER — CARVEDILOL 25 MG PO TABS
37.5000 mg | ORAL_TABLET | Freq: Two times a day (BID) | ORAL | Status: DC
  Filled 2014-09-10 (×2): qty 1

## 2014-09-10 MED ORDER — CARVEDILOL 12.5 MG PO TABS
12.5000 mg | ORAL_TABLET | Freq: Two times a day (BID) | ORAL | Status: DC
  Filled 2014-09-10 (×3): qty 1

## 2014-09-10 NOTE — Progress Notes (Signed)
Physical Therapy Treatment Patient Details Name: Jonathon Bailey MRN: [id number] DOB: 07/13/1956 Today's Date: 09/10/2014    History of Present Illness Pt adm with peritoneal dialysis associated gram negative (E. Cloacae) peritonitis w associated catheter malfunction. Pt with removal of PD catheter on 09/07/14. Pt now on HD. PMH - ESRD, HTN, DM    PT Comments    Pt doing well with mobility and no further PT needed.  Ready for dc from PT standpoint.    Follow Up Recommendations  No PT follow up     Equipment Recommendations  None recommended by PT    Recommendations for Other Services       Precautions / Restrictions Precautions Precautions: None Restrictions Weight Bearing Restrictions: No    Mobility  Bed Mobility               General bed mobility comments: Pt up in chair.  Transfers Overall transfer level: Modified independent   Transfers: Sit to/from Stand Sit to Stand: Modified independent (Device/Increase time)            Ambulation/Gait Ambulation/Gait assistance: Modified independent (Device/Increase time) Ambulation Distance (Feet): 190 Feet Assistive device: Rolling walker (2 wheeled) Gait Pattern/deviations: Step-through pattern;Decreased stride length     General Gait Details: Pt with steady gait using the walker.   Stairs            Wheelchair Mobility    Modified Rankin (Stroke Patients Only)       Balance Overall balance assessment: Needs assistance Sitting-balance support: No upper extremity supported Sitting balance-Leahy Scale: Good       Standing balance-Leahy Scale: Fair                      Cognition Arousal/Alertness: Awake/alert Behavior During Therapy: WFL for tasks assessed/performed Overall Cognitive Status: Within Functional Limits for tasks assessed                      Exercises      General Comments        Pertinent Vitals/Pain Pain Score: 4  Pain Location:  abdomen Pain Intervention(s): Monitored during session;Limited activity within patient's tolerance;Repositioned    Home Living                      Prior Function            PT Goals (current goals can now be found in the care plan section) Progress towards PT goals: Goals met/education completed, patient discharged from PT    Frequency       PT Plan Discharge plan needs to be updated    Co-evaluation             End of Session   Activity Tolerance: Patient tolerated treatment well Patient left: in chair;with call bell/phone within reach     Time: 0857-0910 PT Time Calculation (min) (ACUTE ONLY): 13 min  Charges:  $Gait Training: 8-22 mins                    G Codes:      MAYCOCK,CARY 09/10/2014, 10:46 AM  Cary Maycock PT [phone]   

## 2014-09-10 NOTE — Progress Notes (Signed)
Family Medicine Teaching Service Daily Progress Note Intern Pager: [phone]  Patient name: Jonathon Bailey Medical record number: [id number] Date of birth: 1956-05-02 Age: 58 y.o. Gender: male  Primary Care Provider: Thersa Salt, DO Consultants: Nephrology, Surgery Code Status: FULL (discussed on admission)  Pt Overview and Major Events to Date:  09/01/14: Patient with FOBT+ 09/02/14: NGT placed for decompression 09/03/14: Likely to OR today 09/05/14: 5 beat Vtach overnt, patient asx. Flex sigmoid today. Patient self removed NGT this am. 09/06/14: HD this am. Sx removed PD. 09/08/14: Spiked fever overnight   Assessment and Plan: Jonathon Bailey is a 58 y.o. male presenting with generalized abdominal pain . PMH is significant for ESRD with peritoneal dialysis, T2DM, CAD, HTN, Chronic systolic and diastolic HF, h/o CVA, COPD  Abdominal pain: Peritoneal fluid cloudy and fever to 102F very suspicious for peritonitis. Abdominal xray with moderate stool burden but no acute findings. LA: 1.82>1.06. hgb 6.2 on admission (baseline 7-8). Pt meets sepsis criteria with source of infection, tachycardia, tachypnea, and fever.  CT abdomen pelvis with small amount of free air. Cytology with leukocytosis and intracellular organisms, no malignant cells identified. FluidCx Enterobacter cloacae. Blood cx negative   -HD today?.  Rushie Goltz.  -Dilaudid 1mg  q2 PRN pain -Continue to monitor vitals  -keep on telemetry -c/s surgery, appreciate recs: PD cath removed 12/14 -c/s to renal, appreciate recs: HD cath placed, Patient can go home with tunnel cath when ready for discharge. HD in place for patient -PT to evaluate today: states that patient ok w/out HH this am  Fever: Temperature to 102.1 on 12/16.  Afebrile x24 hours. -Continue to monitor -Tressie Ellis to be scheduled daily per Renal -Will continue to follow abdominal exam closely  Hyponatremia: Na slowly downtrending.  Patient is taking PO  well.  Na 133 this am. -Monitor  Anemia: Baseline Hgb 7-8, on admission Hgb of 6.2. FOBT+, transfused 1 unit pRBCs 09/02/14. Post transfusion hgb 7.1. Had colonoscopy in 2009 with no abnormalities noted. Flex sig with possible necrotic hemorrhoid. Hgb 7.8 this am. -Hold Iron per renal -monitor hgb closely -Continue proctofoam, Miralax -hold home plavix for now, restart when able  ESRD: Receives daily peritoneal HD. Sees Dr Elroy Channel. Missed Sun and Tues sessions. Cr: pending this morning (baseline 10-11). Slightly acidotic on VBG pH 7.316. S/p 1 amp bicarb -HD today? -Continue Fosrenol -holding garamycin   Tachycardia: Stable. HR 70-80's overnight. EKG: RBBB, LPFB. Troponin negative x1.    -management as above -Continue home Coreg  Chronic systolic and diastolic HF: ECHO 123456: EF 40-45%. -Continue BB -KVO fluids  HTN: stable AM BP 126/58.  -Continue home Coreg, Hydralazine  CAD & H/o CVA.: Patient without signs of ischemia on exam. Neurologically in tact.  -Continue home Lipitor, Coreg -Plavix restarted 12/15  T2DM: Last A1c: 7.4. On home levemir 75 u BID -Levemir reduced dose @35  units BID -SSI, renal  COPD: -Continue Albuterol PRN & Spiriva daily  FEN/GI: KVO, Carb modified diet PPx: SCDs (no heparin d/t GI bleed)  Disposition: Discharge home today.  Subjective:  Patient reports that pain is greatly improving.  He denies any concerns at this time.  He reports good BM. Denies rectal bleeding, anal pain, leaking from incision sites.  He is ready to go home.  Understands he has HD scheduled tomorrow and close follow up with his PCP.  Objective: Temp:  [97.7 F (36.5 C)-98.5 F (36.9 C)] 97.9 F (36.6 C) (12/18 0300) Pulse Rate:  [49-113] 79 (12/18 0600) Resp:  [  13-25] 18 (12/18 0600) BP: (104-165)/(53-72) 129/53 mmHg (12/18 0406) SpO2:  [92 %-100 %] 100 % (12/18 0600) Weight:  [280 lb (127.007 kg)] 280 lb (127.007 kg) (12/18 0334) Physical Exam: General:  awake, alert, well appearing male sitting up at bedside, NAD, PT at bedside HEENT: [place]/AT, MMM Cardiovascular: RRR, brisk cap refill Respiratory: CTAB, no increased WOB Abdomen: protuberant, mildly distended, TTP over surgical sites (x4 with staples, healing non infected appearing) and at RLQ, +bowel sounds X4 Extremities: WWP, no edema, clubbing or cyanosis. Psych: normal speech, responds to commands  Laboratory:  Recent Labs Lab 09/08/14 0730 09/09/14 1245 09/10/14 0238  WBC 14.0* 13.5* 12.4*  HGB 8.2* 8.1* 7.8*  HCT 25.6* 26.5* 24.9*  PLT 295 309 419*    Recent Labs Lab 09/08/14 0730 09/09/14 1245 09/10/14 0238  NA 136* 135* 133*  K 4.1 4.1 4.7  CL 94* 95* 93*  CO2 23 24 23   BUN 59* 33* 42*  CREATININE 9.04* 7.01* 8.12*  CALCIUM 9.3 9.1 9.1  GLUCOSE 101* 174* 220*    Imaging/Diagnostic Tests:  No results found.  Janora Norlander, DO 09/10/2014, 7:03 AM PGY-1, [place] Intern pager: [phone], text pages welcome

## 2014-09-10 NOTE — Discharge Instructions (Signed)
[name], we are so glad to see that your belly pain has improved.  You were admitted because you had an infection in your abdomen.  It was found that your peritoneal dialysis catheter was also infected and for that reason, it was surgically removed.  You also had a venous catheter inserted for continued hemodialysis.  Your kidney doctors HIGHLY recommend that your consider fistula placement.  In addition, you were seen by a gastroenterologist for your rectal bleeding.  A flex sigmoid procedure was done, which was unable to find any abnormalities in the sigmoid colon.  However, you were found to have an external hemorrhoid for which you are being prescribed a cream.  Please continue this as directed.  You are being discharged with strong pain medications.  Please make sure you take these exactly as directed, as they are sedating and can be dangerous if not used properly.  You should discontinue taking Calcitriol pills.  Your kidney doctor will be replacing this with another medication outpatient, called Hectoral.  Please make sure to go to your hemodialysis appointment as scheduled.  You will be receiving your last dose of your antibiotics a your next appointment.  You have been scheduled to see Family Medicine next week for hospital follow up.  Please make sure to go to this appointment.  You can start Saturday December 19th at 11:00 AM @ [facility].  Your normal schedule will be on a Tuesday, Thursday and Saturday 1st shift schedule @5 :45am.    Preventing Constipation After Surgery Constipation is when a person has fewer than 3 bowel movements a week; has difficulty having a bowel movement; or has stools that are dry, hard, or larger than normal. Many things can make constipation likely after surgery. They include: 1. Medicines, especially numbing medicines (anesthetics) and very strong pain medicines called narcotics. 2. Feeling stressed because of the surgery. 3. Eating different  foods than normal. 4. Being less active. Symptoms of constipation include:  Having fewer than 3 bowel movements a week.  Straining to have a bowel movement.  Having hard, dry, or larger-than-normal stools.  Feeling full or bloated.  Having pain in the lower abdomen.  Not feeling relief after having a bowel movement. HOME CARE INSTRUCTIONS  Diet  Eat foods that have a lot of fiber. These include fruits, vegetables, whole grains, and beans. Limit foods high in fat and processed sugars. These include french fries, hamburgers, cookies, and candy.  Take a fiber supplement as directed. If you are not taking a fiber supplement and think that you are not getting enough fiber from foods, talk to your health care provider about adding a fiber supplement to your diet.  Drink clear fluids, especially water. Avoid drinking alcohol, caffeine, and soda. These can make constipation worse.  Drink enough fluids to keep your urine clear or pale yellow. Activity   After surgery, return to your normal activities slowly or when your health care provider says it is okay.  Start walking as soon as you can. Try to go a little farther each day.  Once your health care provider approves, do some sort of regular exercise. This helps prevent constipation. Bowel Movements  Go to the restroom when you have the urge to go. Do not hold it in.  Try drinking something hot to get a bowel movement started.  Keep track of how often you use the restroom. If you miss 2-3 bowel movements, talk to your health care provider about medicines that prevent  constipation. Your health care provider may suggest a stool softener, laxative, or fiber supplement.  Only take over-the-counter or prescription medicines as directed by your health care provider.  Do not take other medicines without talking to your health care provider first. If you become constipated and take a medicine to make you have a bowel movement, the problem  may get worse. Other kinds of medicine can also make the problem worse. SEEK MEDICAL CARE IF:  You used stool softeners or laxatives and still have not had a bowel movement within 24-48 hours after using them.  You have not had a bowel movement in 3 days. SEEK IMMEDIATE MEDICAL CARE IF:   Your constipation lasts for more than 4 days or gets worse.  You have bright red blood in your stool.  You have abdominal or rectal pain.  You have very bad cramping.  You have thin, pencil-like stools.  You have unexplained weight loss.  You have a fever or persistent symptoms for more than 2-3 days.  You have a fever and your symptoms suddenly get worse. Document Released: 01/05/2013 Document Revised: 01/25/2014 Document Reviewed: 01/05/2013 [facility] Patient Information 2015 [place], [place]. This information is not intended to replace advice given to you by your health care provider. Make sure you discuss any questions you have with your health care provider.   Hemodialysis Hemodialysis is a way of removing wastes, salt, and extra water from your blood. It is done when your kidneys cannot keep the blood clean. During hemodialysis, your blood travels outside of your body to a machine. A filter in the machine cleans the blood. Hemodialysis is usually done 3 times a week. Visits last 3-5 hours. BEFORE THE PROCEDURE An opening must be made to allow blood to be taken from your body and put back into your body (access). It is made weeks or months before you start hemodialysis. It is usually made in your arm. If you need hemodialysis right away, a thin, flexible tube (catheter) will be placed in your neck, chest, or groin. This type of access is usually temporary. PROCEDURE Hemodialysis is done while you are sitting or leaning back. During hemodialysis you may do any activity as long as you stay sitting or leaning back. Many people sleep, watch television, or read. If you have side effects or feel  uncomfortable, tell your doctor. Your doctor may make changes to help you feel more comfortable. Your hemodialysis visits may look like this: 5. You will be weighed and your temperature will be taken. Your blood pressure and pulse will be checked. 6. The skin around your access will be cleaned. 7. Two needles will be put into the access. They will be connected to a plastic tube. The needles will be taped to your skin so that they do not move. If you have a temporary access, your catheter will be connected to a plastic tube. 8. Your blood will go through the tube to the machine. The machine will clean your blood. Then your blood will go back to your body. Your blood pressure and pulse will be checked a few times. 9. Once the procedure is complete, the needles will be removed. A bandage (dressing) will be placed over the access. If your access is a catheter, it will be disconnected. AFTER THE PROCEDURE  You will be weighed.  Your blood will be tested. This is usually done once a month.  You may have side effects. These include:  Dizziness.  Muscle cramps.  Feeling sick to  your stomach (nausea).  Headaches.  Feeling tired. You usually feel normal the next day.  Itchiness. Your doctor may give medicine to help with this.   Achy or jittery legs. You may feel like kicking your legs. This can cause sleeping problems.   Allergic reaction. MAKE SURE YOU:  Understand these instructions.  Will watch your condition.  Will get help right away if you are not doing well or get worse. Document Released: 08/23/2008 Document Revised: 01/05/2013 Document Reviewed: 10/27/2012 [facility] Patient Information 2015 [place], [place]. This information is not intended to replace advice given to you by your health care provider. Make sure you discuss any questions you have with your health care provider.   OK TO SHOWER FROM A GENERAL SURGICAL STANDPOINT REGARDING HIS ABDOMINAL INCISIONS

## 2014-09-10 NOTE — Progress Notes (Signed)
Patient ID: Jonathon Bailey, male   DOB: 1956-02-20, 58 y.o.   MRN: [id number]   Incisions are clean  He will follow up with our office next week to get his staples out

## 2014-09-10 NOTE — Progress Notes (Signed)
[facility] Progress Note  Subjective: No new complaints, abd still hurts but he will not be specific about how bad, better or worse, etc.  No nausea, "passing gas" which may be new.  Vague historian.   Physical Exam General: talkative, NAD Heart: RRR Lungs: no rales Abdomen: soft + BS sore Extremities: no sig edema Dialysis Access:  Right IJ  Dialysis Orders: Was PD, now HD, s/p TDC by IR. MWF schedule here. CLIP pending  Assessment: 1. PD associated gram negative (E. Cloacae) peritonitis - s/p PD cath removal. Daily improvement in pain.  No further temp spikes. OK for dc from renal standpoint.  Will get 2 more weeks IV abx f Tressie Ellis) from date of cath removal which was 12/15.   2. ESRD on HD, s/p new TDC, clipped to [facility] 3. Anemia Hb 8.2 stable, on max ESA, tsat 10%, started IV iron load but will stop w infection issues for now. Can resume this when infection fully treated.  4. MBD cont fosrenol, po vit D- need to convert to hectorol at d/c 5. HTN/vol - on hydral / coreg, wt's down, no vol excess on exam. At dry wt, BP's normal 6. DM on insulin- per primary 7. Vascular access - pt refuses any consideration of placing AVF/AVG, he has refused in the past, Dr Marval Regal is aware.    Plan - no HD today, to be dc'd most likely and will get HD as OP tomorrow on TTS schedule. 2 more weeks IV Fortaz from 12/15 starting date. No change BP meds.  Resume IV iron when off IV abx.   [name] MD pager [phone]    cell [phone] 09/10/2014, 9:07 AM    Subjective:   Refuses avf Objective Filed Vitals:   09/10/14 0500 09/10/14 0600 09/10/14 0700 09/10/14 0838  BP:    160/79  Pulse: 85 79 79 84  Temp:    98.2 F (36.8 C)  TempSrc:    Oral  Resp: 20 18 14 24   Height:      Weight:      SpO2: 97% 100% 99% 99%     Additional Objective Labs: Basic Metabolic Panel:  Recent Labs Lab 09/04/14 1550 09/05/14 0540  09/08/14 0730 09/09/14 1245 09/10/14 0238  NA  142 143  < > 136* 135* 133*  K 3.9 3.9  < > 4.1 4.1 4.7  CL 98 99  < > 94* 95* 93*  CO2 21 21  < > 23 24 23   GLUCOSE 146* 115*  < > 101* 174* 220*  BUN 97* 58*  < > 59* 33* 42*  CREATININE 11.23* 7.92*  < > 9.04* 7.01* 8.12*  CALCIUM 8.8 8.8  < > 9.3 9.1 9.1  PHOS 7.5* 5.8*  --   --   --   --   < > = values in this interval not displayed. Liver Function Tests:  Recent Labs Lab 09/04/14 1550 09/05/14 0540  ALBUMIN 2.4* 2.4*   CBC:  Recent Labs Lab 09/04/14 0905  09/06/14 0305 09/07/14 0843 09/08/14 0730 09/09/14 1245 09/10/14 0238  WBC 11.3*  < > 11.9* 12.5* 14.0* 13.5* 12.4*  NEUTROABS 8.3*  --   --   --   --   --   --   HGB 7.1*  < > 7.8* 8.6* 8.2* 8.1* 7.8*  HCT 22.1*  < > 24.9* 28.6* 25.6* 26.5* 24.9*  MCV 93.2  < > 93.3 95.7 97.0 95.3 94.3  PLT 228  < > 244  308 295 309 419*  < > = values in this interval not displayed. Blood Culture    Component Value Date/Time   SDES FLUID PERITONEAL 09/03/2014 1350   SPECREQUEST NONE 09/03/2014 1350   CULT  09/02/2014 0159    INSIGNIFICANT GROWTH Performed at [facility] 09/03/2014 FINAL 09/03/2014 1350    Cardiac Enzymes: No results for input(s): CKTOTAL, CKMB, CKMBINDEX, TROPONINI in the last 168 hours. CBG:  Recent Labs Lab 09/09/14 0857 09/09/14 1152 09/09/14 1856 09/09/14 2102 09/10/14 0836  GLUCAP 56* 162* 178* 188* 97  Medications:   . antiseptic oral rinse  7 mL Mouth Rinse BID  . atorvastatin  40 mg Oral q1800  . calcitRIOL  0.25 mcg Oral Daily  . carvedilol  37.5 mg Oral BID WC  . cefTAZidime (FORTAZ)  IV  1 g Intravenous Q24H  . clopidogrel  75 mg Oral Daily  . darbepoetin (ARANESP) injection - NON-DIALYSIS  200 mcg Subcutaneous Q Thu-1800  . fluticasone  2 spray Each Nare Daily  . hydrALAZINE  50 mg Oral 3 times per day  . hydrocortisone   Topical QID  . hydrocortisone-pramoxine  1 applicator Rectal BID  . insulin aspart  0-9 Units Subcutaneous TID WC  . insulin aspart   3 Units Subcutaneous TID WC  . insulin detemir  35 Units Subcutaneous BID  . lanthanum  1,000 mg Oral TID WC  . polyethylene glycol  17 g Oral Daily  . sodium chloride  3 mL Intravenous Q12H  . tiotropium  18 mcg Inhalation Daily

## 2014-09-10 NOTE — Progress Notes (Signed)
Physical Therapy Discharge Patient Details Name: Jonathon Bailey MRN: [id number] DOB: [birth year] Today's Date: 09/10/2014 Time: 8638-1771 PT Time Calculation (min) (ACUTE ONLY): 13 min  Patient discharged from PT services secondary to goals met and no further PT needs identified.  Please see latest therapy progress note for current level of functioning and progress toward goals.    Progress and discharge plan discussed with patient and/or caregiver: Patient/Caregiver agrees with plan  GP     [facility] 09/10/2014, 10:47 AM

## 2014-09-13 ENCOUNTER — Ambulatory Visit: Payer: Medicare Other | Admitting: Family Medicine

## 2014-09-20 ENCOUNTER — Telehealth (INDEPENDENT_AMBULATORY_CARE_PROVIDER_SITE_OTHER): Payer: Self-pay

## 2014-10-14 IMAGING — CR DG CHEST 2V
2 series · 2 of 2 positions shown · non-contrast
Comparison: 10/13/2012

CLINICAL DATA: Shortness of breath

CHEST - 2 VIEW

[w chest pa]
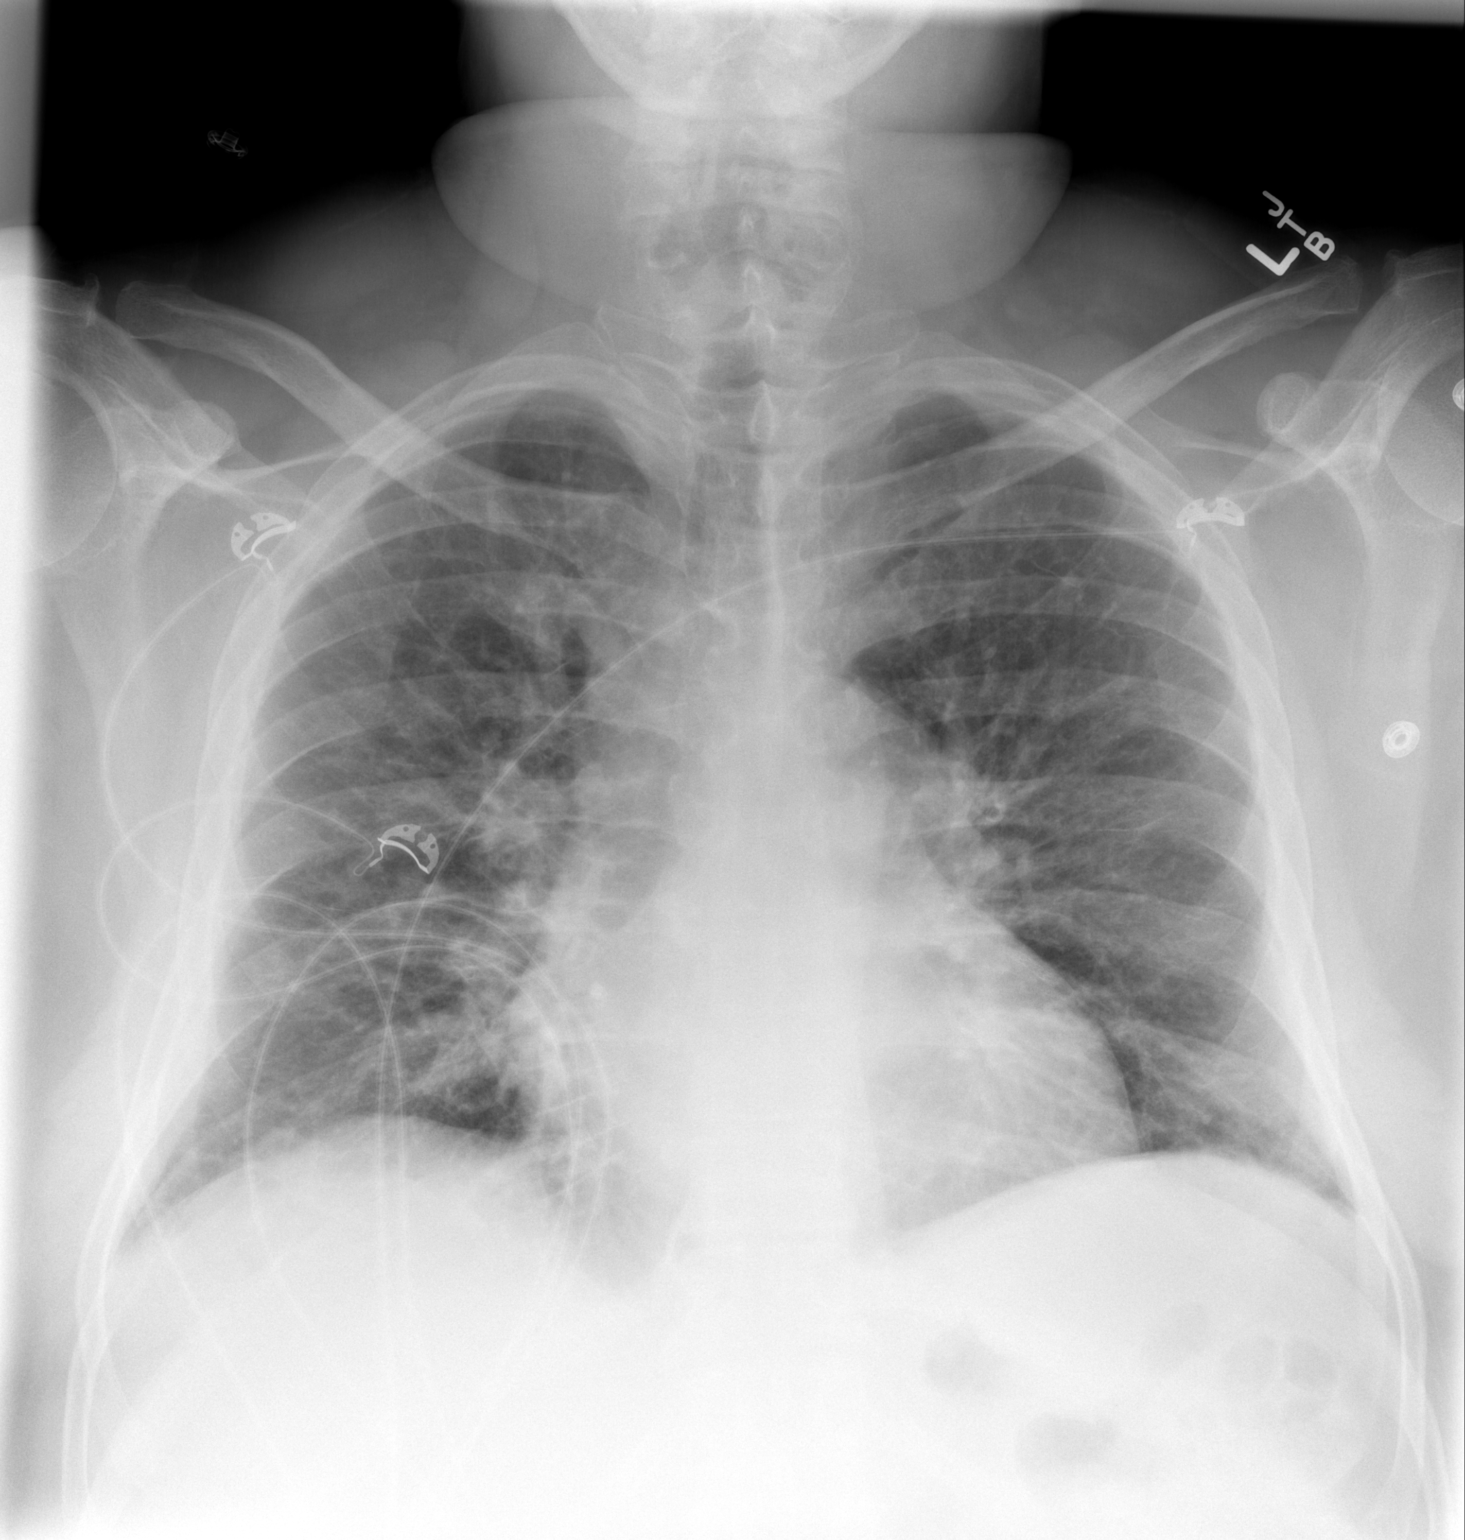

[w chest lat]
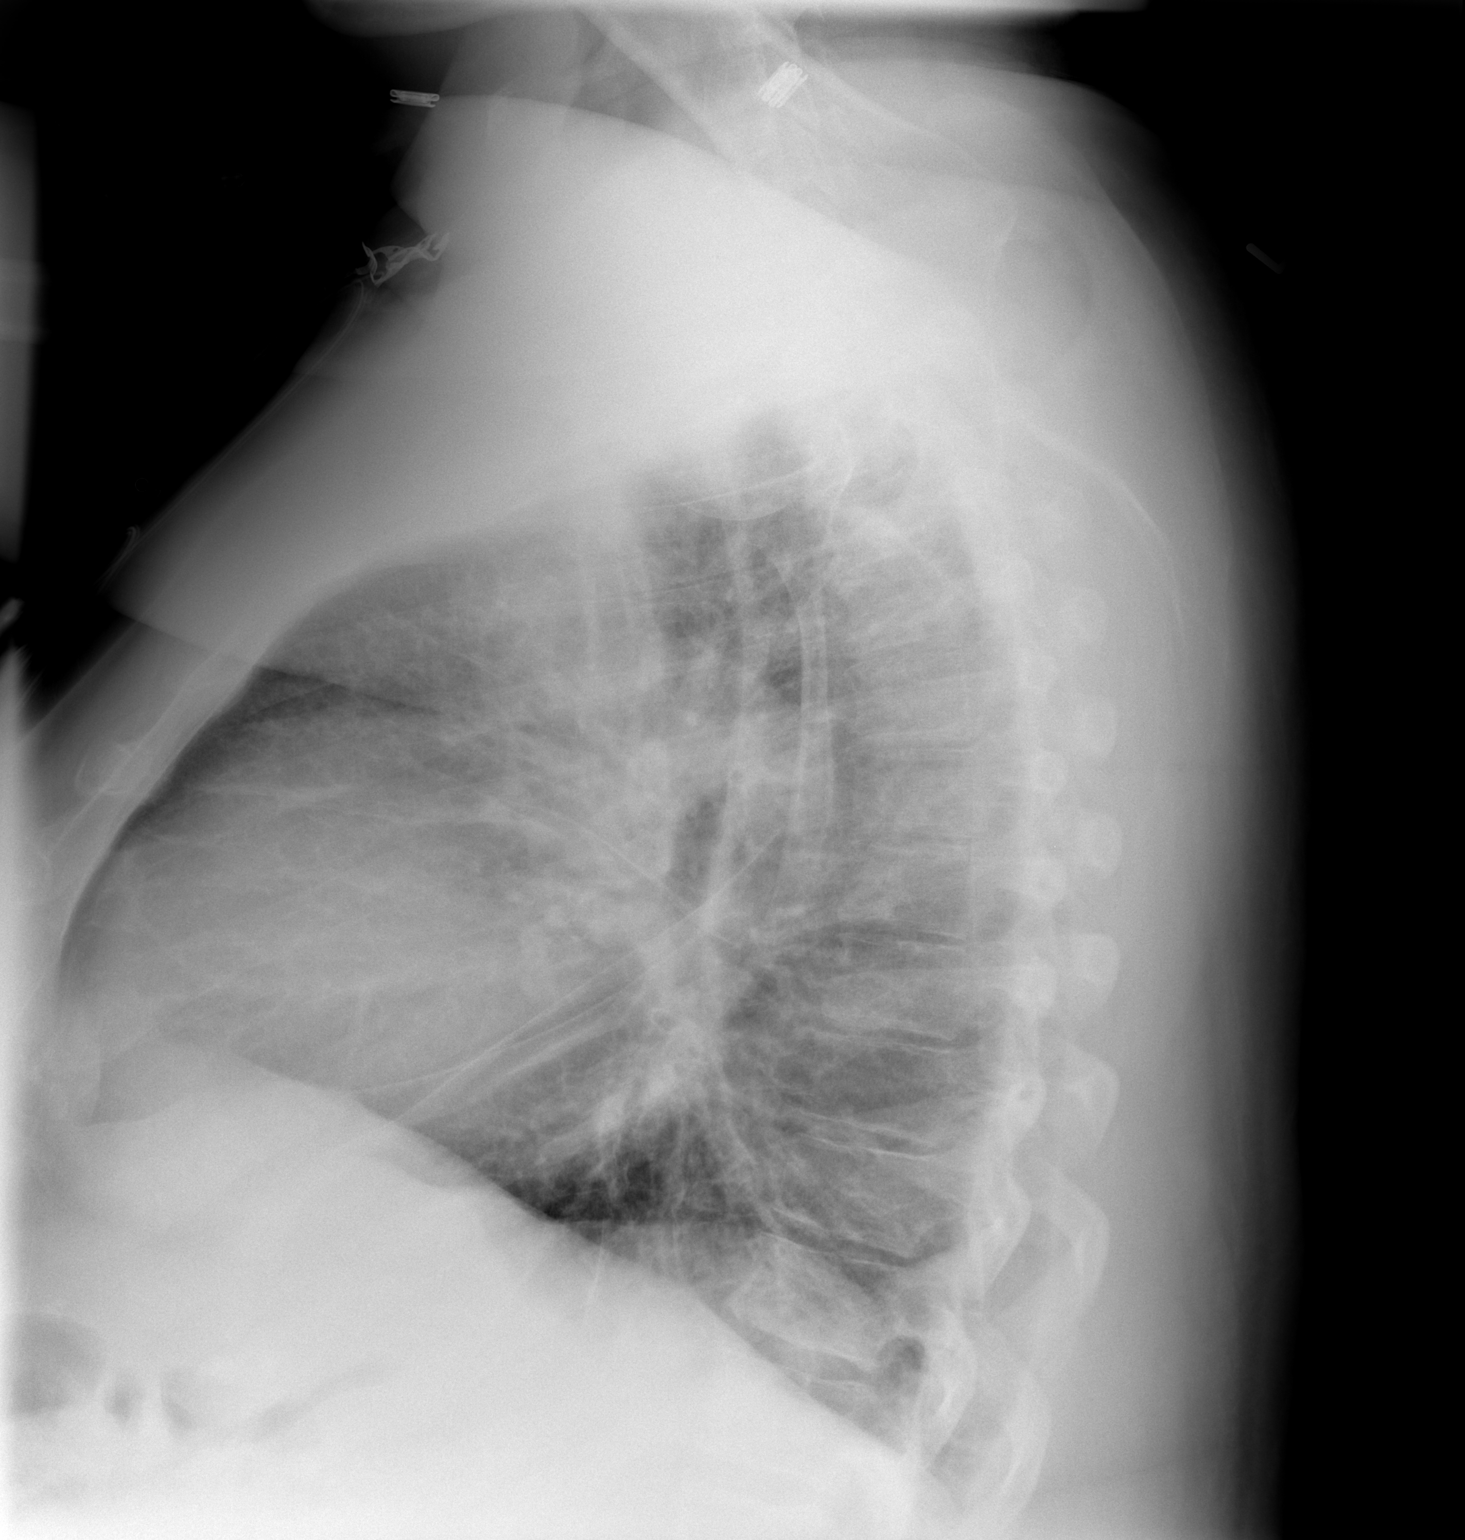

[2 of 2 positions shown; findings below may reference images not displayed]

FINDINGS: Stable cardiomegaly with chronic vascular and
interstitial changes diffusely.  No definite superimposed
pneumonia, CHF, effusion or pneumothorax.  Trachea midline.  No
significant interval change.
IMPRESSION: Cardiomegaly with chronic vascular and interstitial prominence.

## 2014-12-08 ENCOUNTER — Other Ambulatory Visit: Payer: Self-pay | Admitting: *Deleted

## 2014-12-08 DIAGNOSIS — T82510S Breakdown (mechanical) of surgically created arteriovenous fistula, sequela: Secondary | ICD-10-CM

## 2014-12-21 ENCOUNTER — Ambulatory Visit: Payer: Medicare Other | Admitting: Vascular Surgery

## 2014-12-21 ENCOUNTER — Encounter (HOSPITAL_COMMUNITY): Payer: Medicare Other

## 2014-12-21 ENCOUNTER — Other Ambulatory Visit (HOSPITAL_COMMUNITY): Payer: Medicare Other

## 2015-01-06 ENCOUNTER — Encounter: Payer: Self-pay | Admitting: Vascular Surgery

## 2015-01-07 ENCOUNTER — Ambulatory Visit (HOSPITAL_COMMUNITY)
Admission: RE | Admit: 2015-01-07 | Discharge: 2015-01-07 | Disposition: A | Discharge status: Home / Self Care | Payer: Medicare Other | Source: Ambulatory Visit | Attending: Vascular Surgery | Admitting: Vascular Surgery

## 2015-01-07 ENCOUNTER — Ambulatory Visit (INDEPENDENT_AMBULATORY_CARE_PROVIDER_SITE_OTHER): Payer: Medicare Other | Admitting: Vascular Surgery

## 2015-01-07 ENCOUNTER — Ambulatory Visit (INDEPENDENT_AMBULATORY_CARE_PROVIDER_SITE_OTHER)
Admission: RE | Admit: 2015-01-07 | Discharge: 2015-01-07 | Disposition: A | Discharge status: Home / Self Care | Payer: Medicare Other | Source: Ambulatory Visit | Attending: Vascular Surgery | Admitting: Vascular Surgery

## 2015-01-07 ENCOUNTER — Encounter: Payer: Self-pay | Admitting: Vascular Surgery

## 2015-01-07 VITALS — BP 123/74 | HR 90 | Ht 71.0 in | Wt 278.0 lb

## 2015-01-07 DIAGNOSIS — N186 End stage renal disease: Secondary | ICD-10-CM | POA: Diagnosis present

## 2015-01-07 DIAGNOSIS — Z992 Dependence on renal dialysis: Secondary | ICD-10-CM | POA: Diagnosis not present

## 2015-01-07 DIAGNOSIS — T82510S Breakdown (mechanical) of surgically created arteriovenous fistula, sequela: Secondary | ICD-10-CM | POA: Diagnosis not present

## 2015-01-07 NOTE — Progress Notes (Signed)
    Established Dialysis Access  History of Present Illness  Jonathon Bailey is a 59 y.o. (02-07-1956) male who presents for re-evaluation for permanent access.  The patient is right hand dominant.  Previous access procedures have been completed in the left arm: L RC AVF.  On prior fistulogram on this side, I had recommended: ligation of the L RC AVF and proceeding with L BC AVF.  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  The patient has never had a previous PPM placed.  The patient had had a total of 3 runs from the L RC AVF.  The patient's L RC AVF thrombosed sometime over the last few months.  The patient has also recently had a PD catheter removed.     The patient's PMH, PSH, SH, FamHx, Med, and Allergies are unchanged from 03/01/13.  On ROS today: +fear of needles, HD via a RIJ TDC for 2 years  Physical Examination  Filed Vitals:   01/07/15 1518  BP: 123/74  Pulse: 90  Height: 5\' 11"  (1.803 m)  Weight: 278 lb (126.1 kg)  SpO2: 98%   Body mass index is 38.79 kg/(m^2).  General: A&O x 3, WD, obese  Pulmonary: Sym exp, good air movt, CTAB, no rales, rhonchi, & wheezing  Cardiac: RRR, Nl S1, S2, no Murmurs, rubs or gallops  Vascular: Vessel Right Left  Radial Faintly Palpable Not Palpable  Ulnar Not Palpable Not Palpable  Brachial Faintly Palpable Faintly Palpable   Gastrointestinal: soft, NTND, -G/R, - HSM, - masses, - CVAT B  Musculoskeletal: M/S 5/5 throughout , Extremities without  ischemic changes , no palpable thrill in L RC AVF, no bruit  Neurologic: Pain and light touch intact in extremities , Motor exam as listed above  Non-Invasive Vascular Imaging  BUE arterial duplex (Date: 01/07/2015):   Acceptable B brachial arteries  Vein Mapping  (Date: 01/07/2015):   R arm: acceptable vein conduits include upper arm cephalic and basilic veins  L arm: acceptable vein conduits include upper arm cephalic and basilic  veins   Medical Decision Making  Jonathon Bailey is a 59 y.o. male who presents with ESRD requiring hemodialysis.    Based on vein mapping and examination, this patient's permanent access options include: B BC AVF and B single stage BVT.  The patient has no interest in additional arteriovenous fistula or arteriovenous graft due to his fear of needles.  He has elected continued HD via [facility].  Available as needed to assist with this patient's care.   Adele Barthel, MD [facility] Office: [phone] Pager: [phone]  01/07/2015, 3:46 PM

## 2015-04-08 ENCOUNTER — Encounter: Payer: Self-pay | Admitting: *Deleted

## 2015-04-12 ENCOUNTER — Encounter: Payer: Self-pay | Admitting: Cardiovascular Disease

## 2015-04-26 ENCOUNTER — Encounter: Payer: Self-pay | Admitting: Cardiovascular Disease

## 2015-06-22 ENCOUNTER — Encounter: Payer: Self-pay | Admitting: Cardiovascular Disease

## 2016-07-25 LAB — HM DIABETES EYE EXAM

## 2016-08-21 ENCOUNTER — Encounter: Payer: Self-pay | Admitting: Student

## 2016-08-21 DIAGNOSIS — E11319 Type 2 diabetes mellitus with unspecified diabetic retinopathy without macular edema: Secondary | ICD-10-CM | POA: Insufficient documentation

## 2016-09-08 DIAGNOSIS — T80212A Local infection due to central venous catheter, initial encounter: Secondary | ICD-10-CM | POA: Insufficient documentation

## 2016-09-13 ENCOUNTER — Other Ambulatory Visit: Payer: Self-pay | Admitting: *Deleted

## 2016-09-13 DIAGNOSIS — N186 End stage renal disease: Secondary | ICD-10-CM

## 2016-09-13 DIAGNOSIS — Z01810 Encounter for preprocedural cardiovascular examination: Secondary | ICD-10-CM

## 2016-10-23 ENCOUNTER — Encounter: Payer: Self-pay | Admitting: Vascular Surgery

## 2016-10-30 ENCOUNTER — Encounter (HOSPITAL_COMMUNITY): Payer: Medicare Other

## 2016-10-30 ENCOUNTER — Ambulatory Visit: Payer: Medicare Other | Admitting: Vascular Surgery

## 2016-10-30 ENCOUNTER — Other Ambulatory Visit (HOSPITAL_COMMUNITY): Payer: Medicare Other

## 2016-10-30 ENCOUNTER — Ambulatory Visit (HOSPITAL_COMMUNITY): Payer: Medicare Other

## 2016-11-06 ENCOUNTER — Encounter: Payer: Self-pay | Admitting: Vascular Surgery

## 2016-11-14 ENCOUNTER — Ambulatory Visit: Payer: Medicare Other | Admitting: Vascular Surgery

## 2016-11-15 ENCOUNTER — Ambulatory Visit: Payer: Medicare Other | Admitting: Vascular Surgery

## 2016-11-30 ENCOUNTER — Encounter: Payer: Self-pay | Admitting: Vascular Surgery

## 2016-12-05 ENCOUNTER — Ambulatory Visit (HOSPITAL_COMMUNITY)
Admission: RE | Admit: 2016-12-05 | Discharge: 2016-12-05 | Disposition: A | Discharge status: Home / Self Care | Payer: Medicare Other | Source: Ambulatory Visit | Attending: Vascular Surgery | Admitting: Vascular Surgery

## 2016-12-05 ENCOUNTER — Ambulatory Visit (INDEPENDENT_AMBULATORY_CARE_PROVIDER_SITE_OTHER)
Admission: RE | Admit: 2016-12-05 | Discharge: 2016-12-05 | Disposition: A | Discharge status: Home / Self Care | Payer: Medicare Other | Source: Ambulatory Visit | Attending: Vascular Surgery | Admitting: Vascular Surgery

## 2016-12-05 ENCOUNTER — Telehealth: Payer: Self-pay | Admitting: Vascular Surgery

## 2016-12-05 DIAGNOSIS — Z01810 Encounter for preprocedural cardiovascular examination: Secondary | ICD-10-CM

## 2016-12-05 DIAGNOSIS — I82612 Acute embolism and thrombosis of superficial veins of left upper extremity: Secondary | ICD-10-CM | POA: Insufficient documentation

## 2016-12-05 DIAGNOSIS — I770 Arteriovenous fistula, acquired: Secondary | ICD-10-CM | POA: Insufficient documentation

## 2016-12-05 DIAGNOSIS — N186 End stage renal disease: Secondary | ICD-10-CM

## 2016-12-05 NOTE — Telephone Encounter (Signed)
I left a message with Marianna Fuss who deals with VA dialysis patients to make her aware that this patient is in our office for a new access. Authorization is needed for the [facility] to cover this.  The patient has medicare listed in his chart but when he was getting checked in, he mentioned that the [place] pays for his dialysis treatments.

## 2016-12-06 ENCOUNTER — Telehealth: Payer: Self-pay | Admitting: Vascular Surgery

## 2016-12-06 NOTE — Telephone Encounter (Signed)
Kerri from [place] called back to state that she will try to obtain retro authorization since the patient has an dialysis ultrasound yesterday and should be authorized for his office visit with Dr. Scot Dock on 3/21.

## 2016-12-12 ENCOUNTER — Ambulatory Visit: Payer: Medicare Other | Admitting: Vascular Surgery

## 2016-12-19 ENCOUNTER — Encounter: Payer: Self-pay | Admitting: Vascular Surgery

## 2016-12-19 ENCOUNTER — Ambulatory Visit (INDEPENDENT_AMBULATORY_CARE_PROVIDER_SITE_OTHER): Payer: Non-veteran care | Admitting: Vascular Surgery

## 2016-12-19 VITALS — BP 140/84 | HR 88 | Resp 20 | Ht 71.0 in | Wt 280.0 lb

## 2016-12-19 DIAGNOSIS — Z992 Dependence on renal dialysis: Secondary | ICD-10-CM | POA: Diagnosis not present

## 2016-12-19 DIAGNOSIS — N186 End stage renal disease: Secondary | ICD-10-CM | POA: Diagnosis not present

## 2016-12-19 NOTE — Progress Notes (Signed)
Patient name: Jonathon Bailey MRN: [id number] DOB: 11-Jul-1956 Sex: male  REASON FOR VISIT: To evaluate for hemodialysis access. Referred by Dr. Augustin Coupe.  HPI: Jonathon Bailey is a 61 y.o. male who was seen in our office in April 2016 for evaluation for hemodialysis access. At that time, based on the vein mapping and examination it was felt the patient would be a candidate possibly for radiocephalic fistula in either arm or possible basilic vein transposition. The patient did not have any interest in access due to his fear of needles. He was using a tunneled dialysis catheter.  He currently dialyzes on Tuesdays and Thursdays and Saturdays. He has a catheter in the left IJ. He agreed to come in to discuss access but again does not want to proceed at this time because of his fear for needles. When he had his previous left radiocephalic fistula he had some real problems with cannulation. He is also hopes to get a transplant.  He denies any uremic symptoms. Specifically, he denies nausea, vomiting, fatigue, anorexia, or palpitations.  Past Medical History:  Diagnosis Date  . Anxiety   . Arthritis   . Asthma    "as a child"  . CAD (coronary artery disease)   . Chronic systolic heart failure ([place]) 09/21/2008   ECHO Feb 2013 showed LVEF low normal at 50-55%, +hypokinetic anterolateral wall and inferolateral wall.    . CKD (chronic kidney disease) stage 4, GFR 15-29 ml/min (HCC) 08/30/2009   Progressive renal failure since 2008, creatinine 1.2 in 2008 up to 3.5 in 2012 and 3.2-5.0 in 2013. All UA's 2011-13 showed >300 protein on dipstick. Work-up in May 2011 showed negative Urine IFE and SPEP, ultrasound showed 12-13 cm kidneys with increased echogenicity and UPC ratio was 1.5 gm proteinuria.  Hgb A1C's from 2011 to 2013 were all between 9-11.  Patient saw Dr. Donnetta Hutching (vasc surgery) for HD access in Aug 2013 > vein mapping was done and Dr. Donnetta Hutching felt the left arm (pt is R handed) was suitable for L arm  Cimino radiocephalic fistula. Patient said he wasn't ready to consider doing dialysis and declined the surgery.     . Depression   . Diabetic retinopathy ([place])   . ESRD (end stage renal disease) on dialysis ([place])   . TGGYIRSW(546.2)    "maybe monthly" (03/15/2014)  . Hyperlipidemia   . Hypertension   . Macular edema   . Myocardial infarction    status post MI x2 and 3 stents placed in 2003  . Obesity   . Sleep apnea    "sleep w/CPAP sometimes" (03/15/2014)  . Stroke [facility]) ~ 2007; ~1987   "weak on right side; messed w/right side of brain; cry all the time"  . Type II diabetes mellitus (HCC)    insulin dependent    Family History  Problem Relation Age of Onset  . Asthma Mother   . Hyperlipidemia Mother   . Hypertension Mother   . Stroke Father   . Heart attack Father   . Prostate cancer Father   . Deep vein thrombosis Father   . Cancer Father   . Diabetes Father   . Hyperlipidemia Father   . Hypertension Father   . Other Father     varicose veins  . Heart disease Father     before age 37  . Other Sister     varicose veins    SOCIAL HISTORY: Social History  Substance Use Topics  . Smoking status: Former Smoker  Packs/day: 1.00    Years: 15.00    Types: Cigarettes    Quit date: 09/24/1986  . Smokeless tobacco: Never Used  . Alcohol use Yes     Comment: stopped in 2013    Allergies  Allergen Reactions  . Feraheme [Ferumoxytol] Itching    Severe itching.    Current Outpatient Prescriptions  Medication Sig Dispense Refill  . albuterol (PROVENTIL HFA;VENTOLIN HFA) 108 (90 BASE) MCG/ACT inhaler Inhale 2 puffs into the lungs every 4 (four) hours as needed for wheezing. For wheezing    . atorvastatin (LIPITOR) 40 MG tablet Take 40 mg by mouth at bedtime.    . carvedilol (COREG) 25 MG tablet Take 37.5 mg by mouth 2 (two) times daily with a meal.    . clopidogrel (PLAVIX) 75 MG tablet Take 75 mg by mouth daily.    . fluticasone (FLONASE) 50 MCG/ACT nasal spray Place  2 sprays into both nostrils daily.    . hydrALAZINE (APRESOLINE) 50 MG tablet Take 50 mg by mouth 3 (three) times daily.    . hydrocortisone (ANUSOL-HC) 2.5 % rectal cream Apply topically 4 (four) times daily. 30 g 0  . hydrocortisone-pramoxine (PROCTOFOAM-HC) rectal foam Place 1 applicator rectally 2 (two) times daily. 10 g 0  . insulin detemir (LEVEMIR) 100 UNIT/ML injection Inject 75 Units into the skin 2 (two) times daily.    Marland Kitchen lanthanum (FOSRENOL) 1000 MG chewable tablet Chew 1,000-2,000 mg by mouth 3 (three) times daily with meals. Takes 2 tabs with meals and 1-2 tabs with snacks    . polyethylene glycol (MIRALAX / GLYCOLAX) packet Take 17 g by mouth daily. 14 each 0  . tiotropium (SPIRIVA) 18 MCG inhalation capsule Place 18 mcg into inhaler and inhale daily as needed (for shortness of breath).     . tobramycin (TOBREX) 0.3 % ophthalmic solution Place 1 drop into both eyes every 6 (six) hours as needed (for eye irritation).     Marland Kitchen HYDROmorphone (DILAUDID) 2 MG tablet Take 0.5 tablets (1 mg total) by mouth every 2 (two) hours as needed for severe pain. (Patient not taking: Reported on 12/19/2016) 30 tablet 0   No current facility-administered medications for this visit.     REVIEW OF SYSTEMS:  [X]  denotes positive finding, [ ]  denotes negative finding Cardiac  Comments:  Chest pain or chest pressure:    Shortness of breath upon exertion:    Short of breath when lying flat:    Irregular heart rhythm:        Vascular    Pain in calf, thigh, or hip brought on by ambulation:    Pain in feet at night that wakes you up from your sleep:     Blood clot in your veins:    Leg swelling:         Pulmonary    Oxygen at home:    Productive cough:     Wheezing:         Neurologic    Sudden weakness in arms or legs:     Sudden numbness in arms or legs:     Sudden onset of difficulty speaking or slurred speech:    Temporary loss of vision in one eye:     Problems with dizziness:           Gastrointestinal    Blood in stool:     Vomited blood:         Genitourinary    Burning when urinating:  Blood in urine:        Psychiatric    Major depression:         Hematologic    Bleeding problems:    Problems with blood clotting too easily:        Skin    Rashes or ulcers:        Constitutional    Fever or chills:      PHYSICAL EXAM: Vitals:   12/19/16 0954  BP: 140/84  Pulse: 88  Resp: 20  SpO2: 95%  Weight: 280 lb (127 kg)  Height: 5\' 11"  (1.803 m)    GENERAL: The patient is a well-nourished male, in no acute distress. The vital signs are documented above. CARDIAC: There is a regular rate and rhythm.  VASCULAR: He has a palpable right radial pulse. I cannot palpate a left radial pulse only does have a left ulnar pulse. PULMONARY: There is good air exchange bilaterally without wheezing or rales. ABDOMEN: Soft and non-tender with normal pitched bowel sounds.  MUSCULOSKELETAL: There are no major deformities or cyanosis. NEUROLOGIC: No focal weakness or paresthesias are detected. SKIN: There are no ulcers or rashes noted. PSYCHIATRIC: The patient has a normal affect.  DATA:   BILATERAL UPPER EXTREMITY VEIN MAP: I have reviewed the upper extremity vein mapping was done on 12/05/2016.  On the right side the upper arm cephalic vein is small but might be usable. The basilic vein on the right looks reasonable in size.  On the left side, the upper arm cephalic vein looks reasonable. The basilic vein also looks reasonable.  BILATERAL UPPER EXTREMITY ARTERIAL DUPLEX: I have reviewed the upper extremity arterial duplex was done on 12/05/2016. This shows triphasic Doppler signals in the radial and ulnar positions on the right. It also shows a triphasic ulnar signal on the left. There is plaque noted in the distal left radial artery. The patient is had a previous failed left radiocephalic fistula.  MEDICAL ISSUES:  END-STAGE RENAL DISEASE: He appears to be a  reasonable candidate for a left brachiocephalic AV fistula. If this vein were not adequate he could potentially have a left basilic vein transposition. If neither were adequate he could potentially have an AV graft. However again he feels very strongly about not proceeding with access because of his fear for needles. We discussed potentially using cream or spray to help with the pain associated with cannulation however he's tried this and this was not successful. I'll be happy to schedule placement of access in the left arm if he decides to proceed in the future.  [name] [facility] [phone]

## 2016-12-20 ENCOUNTER — Encounter: Payer: Self-pay | Admitting: Nephrology

## 2017-01-01 DIAGNOSIS — R6883 Chills (without fever): Secondary | ICD-10-CM | POA: Insufficient documentation

## 2017-11-12 DIAGNOSIS — Z992 Dependence on renal dialysis: Secondary | ICD-10-CM | POA: Insufficient documentation

## 2017-12-06 ENCOUNTER — Ambulatory Visit (INDEPENDENT_AMBULATORY_CARE_PROVIDER_SITE_OTHER): Payer: Non-veteran care

## 2017-12-06 ENCOUNTER — Ambulatory Visit (INDEPENDENT_AMBULATORY_CARE_PROVIDER_SITE_OTHER): Payer: Non-veteran care | Admitting: Orthopaedic Surgery

## 2017-12-06 ENCOUNTER — Encounter (INDEPENDENT_AMBULATORY_CARE_PROVIDER_SITE_OTHER): Payer: Self-pay | Admitting: Orthopaedic Surgery

## 2017-12-06 VITALS — BP 119/78 | HR 98 | Resp 18 | Ht 71.0 in | Wt 279.0 lb

## 2017-12-06 DIAGNOSIS — M79641 Pain in right hand: Secondary | ICD-10-CM

## 2017-12-06 NOTE — Addendum Note (Signed)
Addended by: Deeann Dowse R on: 12/06/2017 03:37 PM   Modules accepted: Orders

## 2017-12-06 NOTE — Progress Notes (Signed)
Office Visit Note   Patient: Jonathon Bailey           Date of Birth: 1955-12-28           MRN: [id number] Visit Date: 12/06/2017              Requested by: Mercy Riding, MD [place], [place] [address] PCP: Mercy Riding, MD   Assessment & Plan: Visit Diagnoses:  1. 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 Puls.  Prefers to have better function of his hand as it is his dominant extremity.  Refer to the hand service.  Follow-Up Instructions: No Follow-up on file.   Orders:  Orders Placed This Encounter  Procedures  . XR Hand Complete Right   No orders of the defined types were placed in this encounter.     Procedures: No procedures performed   Clinical Data: No additional findings.   Subjective: Chief Complaint  Patient presents with  . Right Hand - Pain, Injury    MR Turner IS 62 Y O M HERE FOR R HAND FX 6 MONTHS AGO PUNCHED SOMETHING 3 TIMES  Mr Brunty relates that he injured his right dominant hand approximately 5 months ago.  Since that time he has had a "small knot" on the dorsum of his little finger.  More importantly, he has had difficulty extending the little finger.  He likes to "eat".  It would like to have better function of the finger.  He is not experiencing any present pain. Has type 2 diabetes with multiple comorbidities.  Has chronic end-stage renal disease and presently has dialysis 3 times a week.  HPI  Review of Systems  Constitutional: Negative for fatigue and fever.  HENT: Negative for ear pain.   Eyes: Negative for pain.  Respiratory: Positive for cough. Negative for shortness of breath.   Cardiovascular: Negative for leg swelling.  Gastrointestinal: Negative for blood in stool, constipation and diarrhea.  Genitourinary: Negative for dysuria.  Musculoskeletal: Negative  for back pain and neck pain.  Skin: Negative for rash and wound.  Neurological: Positive for light-headedness and numbness. Negative for dizziness, weakness and headaches.  Hematological: Does not bruise/bleed easily.  Psychiatric/Behavioral: Negative for sleep disturbance.     Objective: Vital Signs: BP 119/78 (BP Location: Right Arm, Patient Position: Sitting, Cuff Size: Normal)   Pulse 98   Resp 18   Ht 5\' 11"  (1.803 m)   Wt 279 lb (126.6 kg)   BMI 38.91 kg/m   Physical Exam  Ortho Exam awake alert and oriented x3.  Comfortable sitting.  No distress.  Right hand with a small painless "knot" over the distal little metacarpal.  Patient is unable to extend the little finger with no palpable extensor tendon.  Extensor tendons to remaining digits intact.  Fingers a little cool compared to the rest of the hand but has good capillary refill.  Both radial and ulnar pulses were barely palpable.  Some loss of sensibility probably based on his neuropathy  Specialty Comments:  No specialty comments available.  Imaging: Xr Hand Complete Right  Result Date: 12/06/2017 Films of the right dominant hand were obtained in 3 projections.  Appears to be a nonunion of a boxer's fracture of the right fifth metacarpal.  Position is acceptable.  Diffuse calcification of the  radial artery to a lesser extent the ulnar artery    PMFS History: Patient Active Problem List   Diagnosis Date Noted  . Diabetic retinopathy ([place]) 08/21/2016  . Other specified fever   . Generalized abdominal pain   . Acute lower GI bleeding   . ESRD (end stage renal disease) ([place])   . Bacterial peritonitis ([place])   . Blood poisoning   . ESRD on peritoneal dialysis ([place])   . Acute blood loss anemia   . Abdominal pain 09/01/2014  . CAPD (continuous ambulatory peritoneal dialysis) status 03/15/2014  . Morbid obesity ([place]) 02/01/2014  . Recurrent incisional hernia x5 with incarceration s/p lap repair w mesh 03/15/2014  02/01/2014  . Preventative health care 12/02/2013  . Other complications due to renal dialysis device, implant, and graft 10/24/2012  . COPD (chronic obstructive pulmonary disease) ([place]) 10/23/2012  . DIABETIC PERIPHERAL NEUROPATHY 01/09/2010  . ESRD (end stage renal disease) on dialysis ([place]) 08/30/2009  . CEREBROVASCULAR ACCIDENT, HX OF 08/30/2009  . Diabetes mellitus type 2 with complications ([place]) 44/31/5400  . HYPERLIPIDEMIA 09/21/2008  . OBSTRUCTIVE SLEEP APNEA 09/21/2008  . ESSENTIAL HYPERTENSION 09/21/2008  . CORONARY ARTERY DISEASE 09/21/2008  . Chronic combined systolic and diastolic heart failure ([place]) 09/21/2008   Past Medical History:  Diagnosis Date  . Anxiety   . Arthritis   . Asthma    "as a child"  . CAD (coronary artery disease)   . Chronic systolic heart failure ([place]) 09/21/2008   ECHO Feb 2013 showed LVEF low normal at 50-55%, +hypokinetic anterolateral wall and inferolateral wall.    . CKD (chronic kidney disease) stage 4, GFR 15-29 ml/min (HCC) 08/30/2009   Progressive renal failure since 2008, creatinine 1.2 in 2008 up to 3.5 in 2012 and 3.2-5.0 in 2013. All UA's 2011-13 showed >300 protein on dipstick. Work-up in May 2011 showed negative Urine IFE and SPEP, ultrasound showed 12-13 cm kidneys with increased echogenicity and UPC ratio was 1.5 gm proteinuria.  Hgb A1C's from 2011 to 2013 were all between 9-11.  Patient saw Dr. Donnetta Hutching (vasc surgery) for HD access in Aug 2013 > vein mapping was done and Dr. Donnetta Hutching felt the left arm (pt is R handed) was suitable for L arm Cimino radiocephalic fistula. Patient said he wasn't ready to consider doing dialysis and declined the surgery.     . Depression   . Diabetic retinopathy ([place])   . ESRD (end stage renal disease) on dialysis ([place])   . QQPYPPJK(932.6)    "maybe monthly" (03/15/2014)  . Hyperlipidemia   . Hypertension   . Macular edema   . Myocardial infarction [facility])    status post MI x2 and 3 stents placed in 2003  .  Obesity   . Sleep apnea    "sleep w/CPAP sometimes" (03/15/2014)  . Stroke [facility]) ~ 2007; ~1987   "weak on right side; messed w/right side of brain; cry all the time"  . Type II diabetes mellitus (HCC)    insulin dependent    Family History  Problem Relation Age of Onset  . Asthma Mother   . Hyperlipidemia Mother   . Hypertension Mother   . Stroke Father   . Heart attack Father   . Prostate cancer Father   . Deep vein thrombosis Father   . Cancer Father   . Diabetes Father   . Hyperlipidemia Father   . Hypertension Father   . Other Father        varicose veins  . Heart disease  Father        before age 19  . Other Sister        varicose veins    Past Surgical History:  Procedure Laterality Date  . AV FISTULA PLACEMENT  10/17/2012   Procedure: ARTERIOVENOUS (AV) FISTULA CREATION;  Surgeon: Mal Misty, MD;  Location: [facility];  Service: Vascular;  Laterality: Left;  . CAPD INSERTION N/A 03/15/2014   Procedure: LAPAROSCOPIC INSERTION CONTINUOUS AMBULATORY PERITONEAL DIALYSIS CATHETER, LAPARASCOPIC INCISIONAL HERNIA  REPAIR  WITH MESH, OMENTOPEXY AND LYSIS OF ADHESIONS;  Surgeon: [name], MD;  Location: [facility];  Service: General;  Laterality: N/A;  . CORONARY ANGIOPLASTY WITH STENT PLACEMENT  ~ 2002   "3"  . CORONARY ANGIOPLASTY WITH STENT PLACEMENT  01/24/2004   successful PCI/stenting RCA  drug eluting cypher stent  . CORONARY ANGIOPLASTY WITH STENT PLACEMENT  01/28/2004   successful stentin of a large bifurcation marginal branch of the ramus intermediate vessel  . FLEXIBLE SIGMOIDOSCOPY N/A 09/05/2014   Procedure: FLEXIBLE SIGMOIDOSCOPY;  Surgeon: Cleotis Nipper, MD;  Location: [facility] ENDOSCOPY;  Service: Endoscopy;  Laterality: N/A;  . HERNIA REPAIR    . INCISION AND DRAINAGE ABSCESS N/A 09/06/2014   Procedure: REMOVAL OF PD CATH;  Surgeon: Coralie Keens, MD;  Location: [facility];  Service: General;  Laterality: N/A;  . INCISIONAL HERNIA REPAIR  03/15/2014  . LAPAROSCOPIC  LYSIS OF ADHESIONS  03/15/2014  . NM MYOCAR PERF WALL MOTION  02/06/2012   normal perfusion scan  . PERITONEAL CATHETER INSERTION  03/15/2014  . REFRACTIVE SURGERY Bilateral   . SHUNTOGRAM N/A 03/11/2013   Procedure: Fistulogram;  Surgeon: Conrad Lucky, MD;  Location[facility] C[facility] LAB;  Service: Cardiovascular;  Laterality: N/A;  . UMBILICAL HERNIA REPAIR     Social History   Occupational History  . Not on file  Tobacco Use  . Smoking status: Former Smoker    Packs/day: 1.00    Years: 15.00    Pack years: 15.00    Types: Cigarettes    Last attempt to quit: 09/24/1986    Years since quitting: 31.2  . Smokeless tobacco: Never Used  Substance and Sexual Activity  . Alcohol use: Yes    Comment: stopped in 2013  . Drug use: No  . Sexual activity: Not Currently

## 2017-12-16 ENCOUNTER — Encounter: Payer: Self-pay | Admitting: Internal Medicine

## 2018-01-28 ENCOUNTER — Telehealth (INDEPENDENT_AMBULATORY_CARE_PROVIDER_SITE_OTHER): Payer: Self-pay | Admitting: Orthopaedic Surgery

## 2018-01-28 NOTE — Telephone Encounter (Signed)
Jonathon Bailey from Dr. Levell July office called stating they received the referral, but needed to cancel the appointment.  [facility] called [facility] to get the [facility] for the visit and was told that the referring physician needs to request for additional services.  Jonathon Bailey stated that she will reschedule the appointment once we receive the authorization.   Jonathon Bailey [phone]

## 2018-02-14 DIAGNOSIS — Z8673 Personal history of transient ischemic attack (TIA), and cerebral infarction without residual deficits: Secondary | ICD-10-CM | POA: Insufficient documentation

## 2018-02-14 DIAGNOSIS — I200 Unstable angina: Secondary | ICD-10-CM | POA: Insufficient documentation

## 2018-04-04 ENCOUNTER — Other Ambulatory Visit (HOSPITAL_COMMUNITY): Payer: Self-pay | Admitting: Cardiothoracic Surgery

## 2018-04-04 ENCOUNTER — Ambulatory Visit (HOSPITAL_COMMUNITY)
Admission: RE | Admit: 2018-04-04 | Discharge: 2018-04-04 | Disposition: A | Discharge status: Home / Self Care | Payer: Medicare Other | Source: Ambulatory Visit | Attending: Cardiothoracic Surgery | Admitting: Cardiothoracic Surgery

## 2018-04-04 ENCOUNTER — Other Ambulatory Visit: Payer: Self-pay

## 2018-04-04 DIAGNOSIS — I4510 Unspecified right bundle-branch block: Secondary | ICD-10-CM | POA: Insufficient documentation

## 2018-04-04 DIAGNOSIS — Z09 Encounter for follow-up examination after completed treatment for conditions other than malignant neoplasm: Secondary | ICD-10-CM | POA: Insufficient documentation

## 2018-04-04 DIAGNOSIS — J9811 Atelectasis: Secondary | ICD-10-CM | POA: Insufficient documentation

## 2018-04-04 DIAGNOSIS — R079 Chest pain, unspecified: Secondary | ICD-10-CM | POA: Insufficient documentation

## 2018-04-04 LAB — COMPREHENSIVE METABOLIC PANEL WITH GFR
ALT: 17 U/L (ref 0–44)
AST: 17 U/L (ref 15–41)
Albumin: 3.5 g/dL (ref 3.5–5.0)
Alkaline Phosphatase: 93 U/L (ref 38–126)
Anion gap: 12 (ref 5–15)
BUN: 26 mg/dL — ABNORMAL HIGH (ref 8–23)
CO2: 30 mmol/L (ref 22–32)
Calcium: 9.9 mg/dL (ref 8.9–10.3)
Chloride: 99 mmol/L (ref 98–111)
Creatinine, Ser: 7.83 mg/dL — ABNORMAL HIGH (ref 0.61–1.24)
GFR calc Af Amer: 8 mL/min — ABNORMAL LOW
GFR calc non Af Amer: 7 mL/min — ABNORMAL LOW
Glucose, Bld: 124 mg/dL — ABNORMAL HIGH (ref 70–99)
Potassium: 5.4 mmol/L — ABNORMAL HIGH (ref 3.5–5.1)
Sodium: 141 mmol/L (ref 135–145)
Total Bilirubin: 0.9 mg/dL (ref 0.3–1.2)
Total Protein: 8.2 g/dL — ABNORMAL HIGH (ref 6.5–8.1)

## 2018-04-04 LAB — CBC
HCT: 30.3 % — ABNORMAL LOW (ref 39.0–52.0)
HEMOGLOBIN: 9 g/dL — AB (ref 13.0–17.0)
MCH: 28 pg (ref 26.0–34.0)
MCHC: 29.7 g/dL — AB (ref 30.0–36.0)
MCV: 94.4 fL (ref 78.0–100.0)
Platelets: 195 10*3/uL (ref 150–400)
RBC: 3.21 MIL/uL — AB (ref 4.22–5.81)
RDW: 16.1 % — ABNORMAL HIGH (ref 11.5–15.5)
WBC: 7.4 10*3/uL (ref 4.0–10.5)

## 2018-04-04 LAB — LIPID PANEL
Cholesterol: 103 mg/dL (ref 0–200)
HDL: 26 mg/dL — ABNORMAL LOW
LDL Cholesterol: 43 mg/dL (ref 0–99)
Total CHOL/HDL Ratio: 4 ratio
Triglycerides: 170 mg/dL — ABNORMAL HIGH
VLDL: 34 mg/dL (ref 0–40)

## 2018-09-22 DIAGNOSIS — Z4802 Encounter for removal of sutures: Secondary | ICD-10-CM | POA: Insufficient documentation

## 2018-10-21 DIAGNOSIS — E875 Hyperkalemia: Secondary | ICD-10-CM | POA: Insufficient documentation

## 2019-03-03 ENCOUNTER — Other Ambulatory Visit: Payer: Self-pay | Admitting: Orthopedic Surgery

## 2019-03-03 DIAGNOSIS — T1490XA Injury, unspecified, initial encounter: Secondary | ICD-10-CM

## 2019-03-26 ENCOUNTER — Ambulatory Visit
Admission: RE | Admit: 2019-03-26 | Discharge: 2019-03-26 | Disposition: A | Discharge status: Home / Self Care | Payer: Non-veteran care | Source: Ambulatory Visit | Attending: Orthopedic Surgery | Admitting: Orthopedic Surgery

## 2019-03-26 DIAGNOSIS — T1490XA Injury, unspecified, initial encounter: Secondary | ICD-10-CM

## 2019-05-13 ENCOUNTER — Other Ambulatory Visit: Payer: Self-pay | Admitting: Orthopedic Surgery

## 2019-05-27 ENCOUNTER — Encounter (HOSPITAL_BASED_OUTPATIENT_CLINIC_OR_DEPARTMENT_OTHER): Payer: Self-pay | Admitting: *Deleted

## 2019-05-27 ENCOUNTER — Other Ambulatory Visit: Payer: Self-pay

## 2019-06-02 ENCOUNTER — Other Ambulatory Visit (HOSPITAL_COMMUNITY): Admission: RE | Admit: 2019-06-02 | Payer: Non-veteran care | Source: Ambulatory Visit

## 2019-06-03 ENCOUNTER — Other Ambulatory Visit (HOSPITAL_COMMUNITY)
Admission: RE | Admit: 2019-06-03 | Discharge: 2019-06-03 | Disposition: A | Discharge status: Home / Self Care | Payer: Medicare Other | Source: Ambulatory Visit | Attending: Orthopedic Surgery | Admitting: Orthopedic Surgery

## 2019-06-03 DIAGNOSIS — Z01812 Encounter for preprocedural laboratory examination: Secondary | ICD-10-CM | POA: Insufficient documentation

## 2019-06-03 DIAGNOSIS — S66319A Strain of extensor muscle, fascia and tendon of unspecified finger at wrist and hand level, initial encounter: Secondary | ICD-10-CM | POA: Insufficient documentation

## 2019-06-03 DIAGNOSIS — Z20828 Contact with and (suspected) exposure to other viral communicable diseases: Secondary | ICD-10-CM | POA: Insufficient documentation

## 2019-06-03 LAB — SARS CORONAVIRUS 2 (TAT 6-24 HRS): SARS Coronavirus 2: NEGATIVE

## 2019-06-03 NOTE — Anesthesia Preprocedure Evaluation (Addendum)
Anesthesia Evaluation  Patient identified by MRN, date of birth, ID band Patient awake    Reviewed: Allergy & Precautions, NPO status , Patient's Chart, lab work & pertinent test results  Airway Mallampati: III  TM Distance: >3 FB Neck ROM: Full    Dental no notable dental hx. (+) Teeth Intact   Pulmonary asthma , sleep apnea , COPD, former smoker,    Pulmonary exam normal breath sounds clear to auscultation       Cardiovascular Exercise Tolerance: Good hypertension, Pt. on medications + CAD  Normal cardiovascular exam Rhythm:Regular Rate:Normal     Neuro/Psych Depression CVA    GI/Hepatic negative GI ROS, Neg liver ROS,   Endo/Other  diabetes  Renal/GU DialysisRenal disease     Musculoskeletal   Abdominal (+) + obese,   Peds  Hematology   Anesthesia Other Findings   Reproductive/Obstetrics                            Anesthesia Physical Anesthesia Plan  ASA: III  Anesthesia Plan: Regional   Post-op Pain Management:    Induction:   PONV Risk Score and Plan: 2 and Treatment may vary due to age or medical condition  Airway Management Planned: Nasal Cannula and Natural Airway  Additional Equipment:   Intra-op Plan:   Post-operative Plan:   Informed Consent: I have reviewed the patients History and Physical, chart, labs and discussed the procedure including the risks, benefits and alternatives for the proposed anesthesia with the patient or authorized representative who has indicated his/her understanding and acceptance.     Dental advisory given  Plan Discussed with: CRNA  Anesthesia Plan Comments: (R supraclavicular block)       Anesthesia Quick Evaluation

## 2019-06-04 ENCOUNTER — Other Ambulatory Visit: Payer: Self-pay

## 2019-06-04 ENCOUNTER — Encounter (HOSPITAL_BASED_OUTPATIENT_CLINIC_OR_DEPARTMENT_OTHER): Payer: Self-pay | Admitting: Anesthesiology

## 2019-06-04 ENCOUNTER — Ambulatory Visit (HOSPITAL_BASED_OUTPATIENT_CLINIC_OR_DEPARTMENT_OTHER): Payer: No Typology Code available for payment source | Admitting: Anesthesiology

## 2019-06-04 ENCOUNTER — Ambulatory Visit (HOSPITAL_BASED_OUTPATIENT_CLINIC_OR_DEPARTMENT_OTHER)
Admission: RE | Admit: 2019-06-04 | Discharge: 2019-06-04 | Disposition: A | Discharge status: Home / Self Care | Payer: No Typology Code available for payment source | Attending: Orthopedic Surgery | Admitting: Orthopedic Surgery

## 2019-06-04 ENCOUNTER — Encounter (HOSPITAL_BASED_OUTPATIENT_CLINIC_OR_DEPARTMENT_OTHER)
Admission: RE | Disposition: A | Discharge status: Home / Self Care | Payer: Self-pay | Source: Home / Self Care | Attending: Orthopedic Surgery

## 2019-06-04 DIAGNOSIS — S66316A Strain of extensor muscle, fascia and tendon of right little finger at wrist and hand level, initial encounter: Secondary | ICD-10-CM | POA: Diagnosis not present

## 2019-06-04 DIAGNOSIS — I491 Atrial premature depolarization: Secondary | ICD-10-CM | POA: Diagnosis not present

## 2019-06-04 DIAGNOSIS — Z7982 Long term (current) use of aspirin: Secondary | ICD-10-CM | POA: Diagnosis not present

## 2019-06-04 DIAGNOSIS — E11319 Type 2 diabetes mellitus with unspecified diabetic retinopathy without macular edema: Secondary | ICD-10-CM | POA: Diagnosis not present

## 2019-06-04 DIAGNOSIS — G4730 Sleep apnea, unspecified: Secondary | ICD-10-CM | POA: Diagnosis not present

## 2019-06-04 DIAGNOSIS — J449 Chronic obstructive pulmonary disease, unspecified: Secondary | ICD-10-CM | POA: Insufficient documentation

## 2019-06-04 DIAGNOSIS — N186 End stage renal disease: Secondary | ICD-10-CM | POA: Insufficient documentation

## 2019-06-04 DIAGNOSIS — Z6839 Body mass index (BMI) 39.0-39.9, adult: Secondary | ICD-10-CM | POA: Insufficient documentation

## 2019-06-04 DIAGNOSIS — Z87891 Personal history of nicotine dependence: Secondary | ICD-10-CM | POA: Insufficient documentation

## 2019-06-04 DIAGNOSIS — M1990 Unspecified osteoarthritis, unspecified site: Secondary | ICD-10-CM | POA: Diagnosis not present

## 2019-06-04 DIAGNOSIS — E785 Hyperlipidemia, unspecified: Secondary | ICD-10-CM | POA: Diagnosis not present

## 2019-06-04 DIAGNOSIS — Z825 Family history of asthma and other chronic lower respiratory diseases: Secondary | ICD-10-CM | POA: Insufficient documentation

## 2019-06-04 DIAGNOSIS — Z8249 Family history of ischemic heart disease and other diseases of the circulatory system: Secondary | ICD-10-CM | POA: Insufficient documentation

## 2019-06-04 DIAGNOSIS — Z7951 Long term (current) use of inhaled steroids: Secondary | ICD-10-CM | POA: Diagnosis not present

## 2019-06-04 DIAGNOSIS — I132 Hypertensive heart and chronic kidney disease with heart failure and with stage 5 chronic kidney disease, or end stage renal disease: Secondary | ICD-10-CM | POA: Insufficient documentation

## 2019-06-04 DIAGNOSIS — I2510 Atherosclerotic heart disease of native coronary artery without angina pectoris: Secondary | ICD-10-CM | POA: Diagnosis not present

## 2019-06-04 DIAGNOSIS — I5022 Chronic systolic (congestive) heart failure: Secondary | ICD-10-CM | POA: Insufficient documentation

## 2019-06-04 DIAGNOSIS — I252 Old myocardial infarction: Secondary | ICD-10-CM | POA: Diagnosis not present

## 2019-06-04 DIAGNOSIS — Z8673 Personal history of transient ischemic attack (TIA), and cerebral infarction without residual deficits: Secondary | ICD-10-CM | POA: Diagnosis not present

## 2019-06-04 DIAGNOSIS — Z823 Family history of stroke: Secondary | ICD-10-CM | POA: Insufficient documentation

## 2019-06-04 DIAGNOSIS — Z888 Allergy status to other drugs, medicaments and biological substances status: Secondary | ICD-10-CM | POA: Diagnosis not present

## 2019-06-04 DIAGNOSIS — Z8042 Family history of malignant neoplasm of prostate: Secondary | ICD-10-CM | POA: Insufficient documentation

## 2019-06-04 DIAGNOSIS — Z794 Long term (current) use of insulin: Secondary | ICD-10-CM | POA: Insufficient documentation

## 2019-06-04 DIAGNOSIS — Z992 Dependence on renal dialysis: Secondary | ICD-10-CM | POA: Diagnosis not present

## 2019-06-04 DIAGNOSIS — F329 Major depressive disorder, single episode, unspecified: Secondary | ICD-10-CM | POA: Insufficient documentation

## 2019-06-04 DIAGNOSIS — S56417A Strain of extensor muscle, fascia and tendon of right little finger at forearm level, initial encounter: Secondary | ICD-10-CM | POA: Diagnosis present

## 2019-06-04 DIAGNOSIS — F419 Anxiety disorder, unspecified: Secondary | ICD-10-CM | POA: Diagnosis not present

## 2019-06-04 DIAGNOSIS — Z79899 Other long term (current) drug therapy: Secondary | ICD-10-CM | POA: Insufficient documentation

## 2019-06-04 DIAGNOSIS — E1122 Type 2 diabetes mellitus with diabetic chronic kidney disease: Secondary | ICD-10-CM | POA: Insufficient documentation

## 2019-06-04 DIAGNOSIS — E669 Obesity, unspecified: Secondary | ICD-10-CM | POA: Insufficient documentation

## 2019-06-04 DIAGNOSIS — I4510 Unspecified right bundle-branch block: Secondary | ICD-10-CM | POA: Insufficient documentation

## 2019-06-04 DIAGNOSIS — Z955 Presence of coronary angioplasty implant and graft: Secondary | ICD-10-CM | POA: Insufficient documentation

## 2019-06-04 DIAGNOSIS — X58XXXA Exposure to other specified factors, initial encounter: Secondary | ICD-10-CM | POA: Insufficient documentation

## 2019-06-04 HISTORY — PX: BONE EXOSTOSIS EXCISION: SHX1249

## 2019-06-04 HISTORY — PX: TENDON TRANSFER: SHX6109

## 2019-06-04 LAB — BASIC METABOLIC PANEL
Anion gap: 16 — ABNORMAL HIGH (ref 5–15)
BUN: 63 mg/dL — ABNORMAL HIGH (ref 8–23)
CO2: 22 mmol/L (ref 22–32)
Calcium: 9.3 mg/dL (ref 8.9–10.3)
Chloride: 99 mmol/L (ref 98–111)
Creatinine, Ser: 12.42 mg/dL — ABNORMAL HIGH (ref 0.61–1.24)
GFR calc Af Amer: 4 mL/min — ABNORMAL LOW (ref >60)
GFR calc non Af Amer: 4 mL/min — ABNORMAL LOW (ref >60)
Glucose, Bld: 97 mg/dL (ref 70–99)
Potassium: 5.2 mmol/L — ABNORMAL HIGH (ref 3.5–5.1)
Sodium: 137 mmol/L (ref 135–145)

## 2019-06-04 LAB — GLUCOSE, CAPILLARY: Glucose-Capillary: 90 mg/dL (ref 70–99)

## 2019-06-04 SURGERY — TRANSFER, TENDON
Anesthesia: Regional | Site: Hand | Laterality: Right

## 2019-06-04 MED ORDER — FENTANYL CITRATE (PF) 100 MCG/2ML IJ SOLN: 25.0000 ug | INTRAMUSCULAR | Status: DC | PRN

## 2019-06-04 MED ORDER — FENTANYL CITRATE (PF) 100 MCG/2ML IJ SOLN
INTRAMUSCULAR | Status: AC
  Filled 2019-06-04: qty 4

## 2019-06-04 MED ORDER — ONDANSETRON HCL 4 MG/2ML IJ SOLN
INTRAMUSCULAR | Status: AC
  Filled 2019-06-04: qty 2

## 2019-06-04 MED ORDER — DEXTROSE 5 % IV SOLN
3.0000 g | Freq: Once | INTRAVENOUS | Status: AC
  Administered 2019-06-04: 09:00:00 3 g via INTRAVENOUS

## 2019-06-04 MED ORDER — ONDANSETRON HCL 4 MG/2ML IJ SOLN: 4.0000 mg | Freq: Once | INTRAMUSCULAR | Status: DC | PRN

## 2019-06-04 MED ORDER — FENTANYL CITRATE (PF) 100 MCG/2ML IJ SOLN
INTRAMUSCULAR | Status: AC
  Filled 2019-06-04: qty 2

## 2019-06-04 MED ORDER — LACTATED RINGERS IV SOLN
INTRAVENOUS | Status: DC
  Administered 2019-06-04 (×2): via INTRAVENOUS

## 2019-06-04 MED ORDER — MIDAZOLAM HCL 2 MG/2ML IJ SOLN
1.0000 mg | INTRAMUSCULAR | Status: DC | PRN
  Administered 2019-06-04: 08:00:00 1 mg via INTRAVENOUS

## 2019-06-04 MED ORDER — LIDOCAINE 2% (20 MG/ML) 5 ML SYRINGE
INTRAMUSCULAR | Status: AC
  Filled 2019-06-04: qty 5

## 2019-06-04 MED ORDER — PHENYLEPHRINE HCL (PRESSORS) 10 MG/ML IV SOLN
INTRAVENOUS | Status: DC | PRN
  Administered 2019-06-04: 80 ug via INTRAVENOUS

## 2019-06-04 MED ORDER — MIDAZOLAM HCL 5 MG/5ML IJ SOLN
INTRAMUSCULAR | Status: DC | PRN
  Administered 2019-06-04 (×2): 1 mg via INTRAVENOUS

## 2019-06-04 MED ORDER — OXYCODONE HCL 5 MG PO TABS: ORAL_TABLET | ORAL | 0 refills | Status: DC

## 2019-06-04 MED ORDER — CEFAZOLIN SODIUM-DEXTROSE 2-4 GM/100ML-% IV SOLN: 2.0000 g | INTRAVENOUS | Status: DC

## 2019-06-04 MED ORDER — ONDANSETRON HCL 4 MG/2ML IJ SOLN
INTRAMUSCULAR | Status: DC | PRN
  Administered 2019-06-04: 4 mg via INTRAVENOUS

## 2019-06-04 MED ORDER — FENTANYL CITRATE (PF) 100 MCG/2ML IJ SOLN
INTRAMUSCULAR | Status: DC | PRN
  Administered 2019-06-04: 25 ug via INTRAVENOUS

## 2019-06-04 MED ORDER — MIDAZOLAM HCL 2 MG/2ML IJ SOLN
INTRAMUSCULAR | Status: AC
  Filled 2019-06-04: qty 2

## 2019-06-04 MED ORDER — SCOPOLAMINE 1 MG/3DAYS TD PT72: 1.0000 | MEDICATED_PATCH | Freq: Once | TRANSDERMAL | Status: DC

## 2019-06-04 MED ORDER — FENTANYL CITRATE (PF) 100 MCG/2ML IJ SOLN
50.0000 ug | INTRAMUSCULAR | Status: DC | PRN
  Administered 2019-06-04: 50 ug via INTRAVENOUS

## 2019-06-04 MED ORDER — ROPIVACAINE HCL 5 MG/ML IJ SOLN
INTRAMUSCULAR | Status: DC | PRN
  Administered 2019-06-04: 30 mL via PERINEURAL

## 2019-06-04 MED ORDER — CEFAZOLIN SODIUM-DEXTROSE 2-4 GM/100ML-% IV SOLN
INTRAVENOUS | Status: AC
  Filled 2019-06-04: qty 200

## 2019-06-04 MED ORDER — PROPOFOL 10 MG/ML IV BOLUS
INTRAVENOUS | Status: AC
  Filled 2019-06-04: qty 20

## 2019-06-04 MED ORDER — ACETAMINOPHEN 10 MG/ML IV SOLN: 1000.0000 mg | Freq: Once | INTRAVENOUS | Status: DC | PRN

## 2019-06-04 MED ORDER — DEXAMETHASONE SODIUM PHOSPHATE 10 MG/ML IJ SOLN
INTRAMUSCULAR | Status: AC
  Filled 2019-06-04: qty 2

## 2019-06-04 MED ORDER — CHLORHEXIDINE GLUCONATE 4 % EX LIQD: 60.0000 mL | Freq: Once | CUTANEOUS | Status: DC

## 2019-06-04 MED ORDER — PROPOFOL 500 MG/50ML IV EMUL
INTRAVENOUS | Status: DC | PRN
  Administered 2019-06-04: 25 ug/kg/min via INTRAVENOUS

## 2019-06-04 SURGICAL SUPPLY — 75 items
APL PRP STRL LF DISP 70% ISPRP (MISCELLANEOUS) ×1
BAG DECANTER FOR FLEXI CONT (MISCELLANEOUS) IMPLANT
BALL CTTN LRG ABS STRL LF (GAUZE/BANDAGES/DRESSINGS)
BLADE MINI RND TIP GREEN BEAV (BLADE) IMPLANT
BLADE SURG 15 STRL LF DISP TIS (BLADE) ×2 IMPLANT
BLADE SURG 15 STRL SS (BLADE) ×6
BNDG CMPR 9X4 STRL LF SNTH (GAUZE/BANDAGES/DRESSINGS) ×1
BNDG ELASTIC 3X5.8 VLCR STR LF (GAUZE/BANDAGES/DRESSINGS) ×3 IMPLANT
BNDG ESMARK 4X9 LF (GAUZE/BANDAGES/DRESSINGS) ×3 IMPLANT
BNDG GAUZE ELAST 4 BULKY (GAUZE/BANDAGES/DRESSINGS) IMPLANT
CHLORAPREP W/TINT 26 (MISCELLANEOUS) ×3 IMPLANT
CORD BIPOLAR FORCEPS 12FT (ELECTRODE) ×3 IMPLANT
COTTONBALL LRG STERILE PKG (GAUZE/BANDAGES/DRESSINGS) IMPLANT
COVER BACK TABLE REUSABLE LG (DRAPES) ×3 IMPLANT
COVER MAYO STAND REUSABLE (DRAPES) ×3 IMPLANT
COVER WAND RF STERILE (DRAPES) IMPLANT
CUFF TOURN SGL QUICK 18X4 (TOURNIQUET CUFF) ×3 IMPLANT
DECANTER SPIKE VIAL GLASS SM (MISCELLANEOUS) IMPLANT
DRAIN TLS ROUND 10FR (DRAIN) IMPLANT
DRAPE EXTREMITY T 121X128X90 (DISPOSABLE) ×3 IMPLANT
DRAPE OEC MINIVIEW 54X84 (DRAPES) IMPLANT
DRAPE SURG 17X23 STRL (DRAPES) ×3 IMPLANT
DRSG PAD ABDOMINAL 8X10 ST (GAUZE/BANDAGES/DRESSINGS) IMPLANT
GAUZE 4X4 16PLY RFD (DISPOSABLE) IMPLANT
GAUZE XEROFORM 1X8 LF (GAUZE/BANDAGES/DRESSINGS) ×3 IMPLANT
GLOVE BIO SURGEON STRL SZ7.5 (GLOVE) ×3 IMPLANT
GLOVE BIOGEL PI IND STRL 8 (GLOVE) ×1 IMPLANT
GLOVE BIOGEL PI INDICATOR 8 (GLOVE) ×2
GLOVE SURG ORTHO 8.0 STRL STRW (GLOVE) IMPLANT
GOWN STRL REUS W/ TWL LRG LVL3 (GOWN DISPOSABLE) ×1 IMPLANT
GOWN STRL REUS W/TWL LRG LVL3 (GOWN DISPOSABLE) ×3
GOWN STRL REUS W/TWL XL LVL3 (GOWN DISPOSABLE) ×3 IMPLANT
LOOP VESSEL MAXI BLUE (MISCELLANEOUS) IMPLANT
NDL HYPO 25X1 1.5 SAFETY (NEEDLE) IMPLANT
NDL KEITH (NEEDLE) IMPLANT
NEEDLE HYPO 25X1 1.5 SAFETY (NEEDLE) IMPLANT
NEEDLE KEITH (NEEDLE) IMPLANT
NS IRRIG 1000ML POUR BTL (IV SOLUTION) ×3 IMPLANT
PACK BASIN DAY SURGERY FS (CUSTOM PROCEDURE TRAY) ×3 IMPLANT
PAD CAST 3X4 CTTN HI CHSV (CAST SUPPLIES) ×1 IMPLANT
PAD CAST 4YDX4 CTTN HI CHSV (CAST SUPPLIES) IMPLANT
PADDING CAST ABS 3INX4YD NS (CAST SUPPLIES)
PADDING CAST ABS 4INX4YD NS (CAST SUPPLIES) ×2
PADDING CAST ABS COTTON 3X4 (CAST SUPPLIES) IMPLANT
PADDING CAST ABS COTTON 4X4 ST (CAST SUPPLIES) ×1 IMPLANT
PADDING CAST COTTON 3X4 STRL (CAST SUPPLIES) ×3
PADDING CAST COTTON 4X4 STRL (CAST SUPPLIES)
SLEEVE SCD COMPRESS KNEE MED (MISCELLANEOUS) IMPLANT
SPLINT PLASTER CAST XFAST 3X15 (CAST SUPPLIES) IMPLANT
SPLINT PLASTER XTRA FASTSET 3X (CAST SUPPLIES)
STOCKINETTE 4X48 STRL (DRAPES) ×3 IMPLANT
SUT CHROMIC 5 0 P 3 (SUTURE) IMPLANT
SUT ETHIBOND 3-0 V-5 (SUTURE) IMPLANT
SUT ETHILON 3 0 PS 1 (SUTURE) IMPLANT
SUT ETHILON 4 0 PS 2 18 (SUTURE) IMPLANT
SUT FIBERWIRE 3-0 18 TAPR NDL (SUTURE)
SUT FIBERWIRE 4-0 18 DIAM BLUE (SUTURE)
SUT MERSILENE 2.0 SH NDLE (SUTURE) IMPLANT
SUT MERSILENE 4 0 P 3 (SUTURE) IMPLANT
SUT PROLENE 2 0 SH DA (SUTURE) IMPLANT
SUT PROLENE 6 0 P 1 18 (SUTURE) IMPLANT
SUT SILK 2 0 PERMA HAND 18 BK (SUTURE) IMPLANT
SUT SILK 4 0 PS 2 (SUTURE) IMPLANT
SUT VIC AB 3-0 PS1 18 (SUTURE)
SUT VIC AB 3-0 PS1 18XBRD (SUTURE) IMPLANT
SUT VIC AB 4-0 P-3 18XBRD (SUTURE) IMPLANT
SUT VIC AB 4-0 P3 18 (SUTURE)
SUT VICRYL 4-0 PS2 18IN ABS (SUTURE) IMPLANT
SUTURE FIBERWR 3-0 18 TAPR NDL (SUTURE) IMPLANT
SUTURE FIBERWR 4-0 18 DIA BLUE (SUTURE) IMPLANT
SYR BULB 3OZ (MISCELLANEOUS) ×3 IMPLANT
SYR CONTROL 10ML LL (SYRINGE) IMPLANT
TOWEL GREEN STERILE FF (TOWEL DISPOSABLE) ×6 IMPLANT
TUBE FEEDING ENTERAL 5FR 16IN (TUBING) IMPLANT
UNDERPAD 30X36 HEAVY ABSORB (UNDERPADS AND DIAPERS) ×3 IMPLANT

## 2019-06-04 NOTE — Progress Notes (Signed)
Assisted Dr. Houser with left, ultrasound guided, supraclavicular block. Side rails up, monitors on throughout procedure. See vital signs in flow sheet. Tolerated Procedure well. °

## 2019-06-04 NOTE — Anesthesia Procedure Notes (Signed)
Procedure Name: MAC Date/Time: 06/04/2019 8:35 AM Performed by: Marrianne Mood, CRNA Pre-anesthesia Checklist: Patient identified, Emergency Drugs available, Suction available, Patient being monitored and Timeout performed Patient Re-evaluated:Patient Re-evaluated prior to induction Oxygen Delivery Method: Simple face mask Preoxygenation: Pre-oxygenation with 100% oxygen

## 2019-06-04 NOTE — Op Note (Signed)
NAME: Jonathon Bailey RECORD NO: [id number] DATE OF BIRTH: Jan 31, 1956 FACILITY: [facility] LOCATION: [facility] PHYSICIAN: Tennis Must, MD   OPERATIVE REPORT   DATE OF PROCEDURE: 06/04/19    PREOPERATIVE DIAGNOSIS:   Right small finger extensor tendon rupture   POSTOPERATIVE DIAGNOSIS:   Right small finger extensor tendon rupture and adhesions   PROCEDURE:   1.  Tendon transfer from right ring finger EDC to right small finger EDC 2.  Tenolysis right small finger extensor tendon   SURGEON:  Leanora Cover, M.D.   ASSISTANT: Daryll Brod, MD   ANESTHESIA:  Regional with sedation   INTRAVENOUS FLUIDS:  Per anesthesia flow sheet.   ESTIMATED BLOOD LOSS:  Minimal.   COMPLICATIONS:  None.   SPECIMENS:  none   TOURNIQUET TIME:    Total Tourniquet Time Documented: Upper Arm (Right) - 39 minutes Total: Upper Arm (Right) - 39 minutes    DISPOSITION:  Stable to PACU.   INDICATIONS: 63 year old male status post right small finger metacarpal fracture with rupture of extensor tendon.  He wishes to undergo tendon transfer to regain extension of the small finger. Risks, benefits and alternatives of surgery were discussed including the risks of blood loss, infection, damage to nerves, vessels, tendons, ligaments, bone for surgery, need for additional surgery, complications with wound healing, continued pain, stiffness.  He voiced understanding of these risks and elected to proceed.  OPERATIVE COURSE:  After being identified preoperatively by myself,  the patient and I agreed on the procedure and site of the procedure.  The surgical site was marked.  Surgical consent had been signed. He was given IV antibiotics as preoperative antibiotic prophylaxis. He was transferred to the operating room and placed on the operating table in supine position with the Right Right upper extremity on an arm board.  Sedation was induced by the anesthesiologist. A regional block had been  performed by anesthesia in preoperative holding.   Right upper extremity was prepped and draped in normal sterile orthopedic fashion.  A surgical pause was performed between the surgeons, anesthesia, and operating room staff and all were in agreement as to the patient, procedure, and site of procedure.  Tourniquet at the proximal aspect of the extremity was inflated to 250 mmHg after exsanguination of the arm with an Esmarch bandage.    Incision was made at the dorsum of the small finger metacarpal more toward the radial side.  This is carried in subcutaneous tissues by spreading technique.  Bipolar cautery was used to obtain hemostasis.  Neurovascular structures were protected.  There was noted to be rupture of the extensor tendons to the small finger proximal to the MP joint.  There was one juncture coming from the ring finger extensor tendons to the small finger distal to the rupture.  There is significant scarring and adhesion of the stump of the extensor tendon.  This was carefully freed up with the scissors and freer elevator.  This was used to free up the adhesions onto the proximal phalanx.  The EDC to the ring finger was brought with multiple slips.  The ulnar most slip including the juncture going toward the small finger was taken and transferred to the [facility] to the small finger.  This was done near the base of the proximal phalanx as this provided better extension at the PIP joint.  A 2 past Pulvertaft weave was used.  This was secured with a 4-0 Mersilene suture.  The wrist was placed through tenodesis  and there was good extension of the small finger with the wrist flexed.  The wound was copiously irrigated with sterile saline.  Was then closed with 4-0 nylon in a horizontal mattress fashion.  It was dressed with sterile Xeroform 4 x 4's and wrapped with a Kerlix bandage.  A volar splint was placed and wrapped with Kerlix and Ace bandage.  The tourniquet was deflated at 39 minutes.  Fingertips were pink  with brisk capillary refill after deflation of tourniquet.  The operative  drapes were broken down.  The patient was awoken from anesthesia safely.  He was transferred back to the stretcher and taken to PACU in stable condition.  I will see him back in the office in 1 week for postoperative followup.  I will give him a prescription for Oxycodone 5 mg 1-2 p.o. every 6 hours as needed pain dispense #20.   Leanora Cover, MD Electronically signed, 06/04/19

## 2019-06-04 NOTE — Transfer of Care (Signed)
Immediate Anesthesia Transfer of Care Note  Patient: Jonathon Bailey  Procedure(s) Performed: RIGHT HAND EXTENSOR TENDON TRANSFER TO SMALL FINGER (Right Hand) EXOSTOSIS EXCISION (Right Hand)  Patient Location: PACU  Anesthesia Type:MAC combined with regional for post-op pain  Level of Consciousness: awake and patient cooperative  Airway & Oxygen Therapy: Patient connected to face mask oxygen  Post-op Assessment: Post -op Vital signs reviewed and stable  Post vital signs: Reviewed and stable  Last Vitals:  Vitals Value Taken Time  BP 91/67 06/04/19 0952  Temp    Pulse    Resp 21 06/04/19 0953  SpO2    Vitals shown include unvalidated device data.  Last Pain:  Vitals:   06/04/19 0727  TempSrc: Oral  PainSc: 0-No pain      Patients Stated Pain Goal: 0 (76/81/15 7262)  Complications: No apparent anesthesia complications

## 2019-06-04 NOTE — Anesthesia Postprocedure Evaluation (Signed)
Anesthesia Post Note  Patient: Jonathon Bailey  Procedure(s) Performed: RIGHT HAND EXTENSOR TENDON TRANSFER TO SMALL FINGER (Right Hand) EXOSTOSIS EXCISION (Right Hand)     Patient location during evaluation: PACU Anesthesia Type: Regional Level of consciousness: awake and alert Pain management: pain level controlled Vital Signs Assessment: post-procedure vital signs reviewed and stable Respiratory status: spontaneous breathing, nonlabored ventilation, respiratory function stable and patient connected to nasal cannula oxygen Cardiovascular status: stable and blood pressure returned to baseline Postop Assessment: no apparent nausea or vomiting Anesthetic complications: no    Last Vitals:  Vitals:   06/04/19 1030 06/04/19 1104  BP: 107/60 124/71  Pulse: 77 77  Resp: 18 16  Temp:  36.6 C  SpO2: 95% 95%    Last Pain:  Vitals:   06/04/19 1104  TempSrc: Oral  PainSc: 0-No pain                 Barnet Glasgow

## 2019-06-04 NOTE — Anesthesia Procedure Notes (Signed)
Anesthesia Regional Block: Supraclavicular block   Pre-Anesthetic Checklist: ,, timeout performed, Correct Patient, Correct Site, Correct Laterality, Correct Procedure, Correct Position, site marked, Risks and benefits discussed,  Surgical consent,  Pre-op evaluation,  At surgeon's request and post-op pain management  Laterality: Right  Prep: chloraprep       Needles:  Injection technique: Single-shot  Needle Type: Echogenic Needle     Needle Length: 5cm  Needle Gauge: 21     Additional Needles:   Procedures:,,,, ultrasound used (permanent image in chart),,,,  Narrative:  Start time: 06/04/2019 8:08 AM End time: 06/04/2019 8:15 AM Injection made incrementally with aspirations every 5 mL.  Performed by: Personally  Anesthesiologist: Barnet Glasgow, MD

## 2019-06-04 NOTE — H&P (Signed)
Jonathon Bailey is an 63 y.o. male.   Chief Complaint: right hand extensor tendon rupture HPI: 63 yo male s/p right small mc fracture with inability to extend small finger.  He wishes to have extensor tendon transfer to small finger and possible excision exostosis.  Allergies:  Allergies  Allergen Reactions  . Feraheme [Ferumoxytol] Itching    Severe itching.    Past Medical History:  Diagnosis Date  . Anxiety   . Arthritis   . Asthma    "as a child"  . CAD (coronary artery disease)   . Chronic systolic heart failure ([place]) 09/21/2008   ECHO Feb 2013 showed LVEF low normal at 50-55%, +hypokinetic anterolateral wall and inferolateral wall.    . CKD (chronic kidney disease) stage 4, GFR 15-29 ml/min (HCC) 08/30/2009   Progressive renal failure since 2008, creatinine 1.2 in 2008 up to 3.5 in 2012 and 3.2-5.0 in 2013. All UA's 2011-13 showed >300 protein on dipstick. Work-up in May 2011 showed negative Urine IFE and SPEP, ultrasound showed 12-13 cm kidneys with increased echogenicity and UPC ratio was 1.5 gm proteinuria.  Hgb A1C's from 2011 to 2013 were all between 9-11.  Patient saw Dr. Donnetta Hutching (vasc surgery) for HD access in Aug 2013 > vein mapping was done and Dr. Donnetta Hutching felt the left arm (pt is R handed) was suitable for L arm Cimino radiocephalic fistula. Patient said he wasn't ready to consider doing dialysis and declined the surgery.     . Depression   . Diabetic retinopathy ([place])   . ESRD (end stage renal disease) on dialysis ([place])   . [id number])    "maybe monthly" (03/15/2014)  . Hyperlipidemia   . Hypertension   . Macular edema   . Myocardial infarction [facility])    status post MI x2 and 3 stents placed in 2003  . Obesity   . Sleep apnea    does not use CPAP  . Stroke [facility]) ~ 2007; ~1987   "weak on right side; messed w/right side of brain; cry all the time"  . Type II diabetes mellitus (HCC)    insulin dependent    Past Surgical History:  Procedure Laterality Date  .  AV FISTULA PLACEMENT  10/17/2012   Procedure: ARTERIOVENOUS (AV) FISTULA CREATION;  Surgeon: Mal Misty, MD;  Location: [place];  Service: Vascular;  Laterality: Left;  . CAPD INSERTION N/A 03/15/2014   Procedure: LAPAROSCOPIC INSERTION CONTINUOUS AMBULATORY PERITONEAL DIALYSIS CATHETER, LAPARASCOPIC INCISIONAL HERNIA  REPAIR  WITH MESH, OMENTOPEXY AND LYSIS OF ADHESIONS;  Surgeon: [name], MD;  Location: [place];  Service: General;  Laterality: N/A;  . CORONARY ANGIOPLASTY WITH STENT PLACEMENT  ~ 2002   "3"  . CORONARY ANGIOPLASTY WITH STENT PLACEMENT  01/24/2004   successful PCI/stenting RCA  drug eluting cypher stent  . CORONARY ANGIOPLASTY WITH STENT PLACEMENT  01/28/2004   successful stentin of a large bifurcation marginal branch of the ramus intermediate vessel  . FLEXIBLE SIGMOIDOSCOPY N/A 09/05/2014   Procedure: FLEXIBLE SIGMOIDOSCOPY;  Surgeon: Cleotis Nipper, MD;  Location[facility] ENDOSCOPY;  Service: Endoscopy;  Laterality: N/A;  . HERNIA REPAIR    . INCISION AND DRAINAGE ABSCESS N/A 09/06/2014   Procedure: REMOVAL OF PD CATH;  Surgeon: Coralie Keens, MD;  Location: [place];  Service: General;  Laterality: N/A;  . INCISIONAL HERNIA REPAIR  03/15/2014  . LAPAROSCOPIC LYSIS OF ADHESIONS  03/15/2014  . NM MYOCAR PERF WALL MOTION  02/06/2012   normal perfusion scan  .  PERITONEAL CATHETER INSERTION  03/15/2014  . REFRACTIVE SURGERY Bilateral   . SHUNTOGRAM N/A 03/11/2013   Procedure: Fistulogram;  Surgeon: Conrad Gypsy, MD;  Location: [facility] CATH LAB;  Service: Cardiovascular;  Laterality: N/A;  . UMBILICAL HERNIA REPAIR      Family History: Family History  Problem Relation Age of Onset  . Asthma Mother   . Hyperlipidemia Mother   . Hypertension Mother   . Stroke Father   . Heart attack Father   . Prostate cancer Father   . Deep vein thrombosis Father   . Cancer Father   . Diabetes Father   . Hyperlipidemia Father   . Hypertension Father   . Other Father        varicose  veins  . Heart disease Father        before age 66  . Other Sister        varicose veins    Social History:   reports that he quit smoking about 32 years ago. His smoking use included cigarettes. He has a 15.00 pack-year smoking history. He has never used smokeless tobacco. He reports previous alcohol use. He reports that he does not use drugs.  Medications: Medications Prior to Admission  Medication Sig Dispense Refill  . albuterol (PROVENTIL HFA;VENTOLIN HFA) 108 (90 BASE) MCG/ACT inhaler Inhale 2 puffs into the lungs every 4 (four) hours as needed for wheezing. For wheezing    . aspirin 325 MG tablet Take 325 mg by mouth daily.    Marland Kitchen atorvastatin (LIPITOR) 40 MG tablet Take 40 mg by mouth at bedtime.    . B Complex-C-Biotin-E-Min-FA (DIALYVITE 5000 PO) Take by mouth.    . benzonatate (TESSALON) 100 MG capsule Take by mouth 3 (three) times daily as needed for cough.    . carvedilol (COREG) 25 MG tablet Take 37.5 mg by mouth 2 (two) times daily with a meal.    . cinacalcet (SENSIPAR) 60 MG tablet Take 60 mg by mouth daily.    . insulin detemir (LEVEMIR) 100 UNIT/ML injection Inject 75 Units into the skin 2 (two) times daily.    . insulin lispro (HUMALOG) 100 UNIT/ML injection Inject into the skin 3 (three) times daily with meals.    . fluticasone (FLONASE) 50 MCG/ACT nasal spray Place 2 sprays into both nostrils daily.    . hydrALAZINE (APRESOLINE) 50 MG tablet Take 50 mg by mouth 3 (three) times daily.    . polyethylene glycol (MIRALAX / GLYCOLAX) packet Take 17 g by mouth daily. 14 each 0  . tiotropium (SPIRIVA) 18 MCG inhalation capsule Place 18 mcg into inhaler and inhale daily as needed (for shortness of breath).     . tobramycin (TOBREX) 0.3 % ophthalmic solution Place 1 drop into both eyes every 6 (six) hours as needed (for eye irritation).       Results for orders placed or performed during the hospital encounter of 06/04/19 (from the past 48 hour(s))  Basic metabolic panel      Status: Abnormal   Collection Time: 06/04/19  7:26 AM  Result Value Ref Range   Sodium 137 135 - 145 mmol/L   Potassium 5.2 (H) 3.5 - 5.1 mmol/L   Chloride 99 98 - 111 mmol/L   CO2 22 22 - 32 mmol/L   Glucose, Bld 97 70 - 99 mg/dL   BUN 63 (H) 8 - 23 mg/dL   Creatinine, Ser 12.42 (H) 0.61 - 1.24 mg/dL   Calcium 9.3 8.9 - 10.3 mg/dL  GFR calc non Af Amer 4 (L) >60 mL/min   GFR calc Af Amer 4 (L) >60 mL/min   Anion gap 16 (H) 5 - 15    Comment: Performed at [facility] [address], [place],  [address]    No results found.   A comprehensive review of systems was negative.  Blood pressure 111/65, pulse 87, temperature 98.2 F (36.8 C), temperature source Oral, resp. rate 19, height 5\' 11"  (1.803 m), weight 127.9 kg, SpO2 100 %.  General appearance: alert, cooperative and appears stated age Head: Normocephalic, without obvious abnormality, atraumatic Neck: supple, symmetrical, trachea midline Cardio: regular rate and rhythm Resp: clear to auscultation bilaterally Extremities: Intact sensation and capillary refill all digits.  +epl/fpl/io.  No wounds. Unable to extend right small finger.   Pulses: 2+ and symmetric Skin: Skin color, texture, turgor normal. No rashes or lesions Neurologic: Grossly normal Incision/Wound: none  Assessment/Plan Right small finger extensor tendon rupture and bony prominence small finger metacarpal.  Plan transfer extensor tendon to small finger possibly adjacent ring finger tendon slip vs extensor indicis proprius and possibly excision of bony exostosis.  Non operative and operative treatment options have been discussed with the patient and patient wishes to proceed with operative treatment. Risks, benefits, and alternatives of surgery have been discussed and the patient agrees with the plan of care.   Leanora Cover 06/04/2019, 8:48 AM

## 2019-06-04 NOTE — Op Note (Signed)
I assisted Surgeon(s) and Role:    * Leanora Cover, MD - Primary    Daryll Brod, MD - Assisting on the Procedure(s): RIGHT HAND EXTENSOR TENDON TRANSFER TO SMALL FINGER EXOSTOSIS EXCISION on 06/04/2019.  I provided assistance on this case as follows: setup, approach, tenolysis tendon, transfer of tendon, repair of tendon transfer, closure of the incision and application of the dressings and splint. Electronically signed by: Daryll Brod, MD Date: 06/04/2019 Time: 9:49 AM

## 2019-06-04 NOTE — Discharge Instructions (Addendum)

## 2019-06-04 NOTE — Anesthesia Procedure Notes (Deleted)
Anesthesia Procedure Note     

## 2019-06-05 ENCOUNTER — Encounter (HOSPITAL_BASED_OUTPATIENT_CLINIC_OR_DEPARTMENT_OTHER): Payer: Self-pay | Admitting: Orthopedic Surgery

## 2019-08-03 DIAGNOSIS — E8770 Fluid overload, unspecified: Secondary | ICD-10-CM | POA: Insufficient documentation

## 2019-10-21 ENCOUNTER — Other Ambulatory Visit (HOSPITAL_BASED_OUTPATIENT_CLINIC_OR_DEPARTMENT_OTHER): Payer: Self-pay

## 2019-10-21 DIAGNOSIS — G4733 Obstructive sleep apnea (adult) (pediatric): Secondary | ICD-10-CM

## 2019-10-31 ENCOUNTER — Other Ambulatory Visit (HOSPITAL_COMMUNITY)
Admission: RE | Admit: 2019-10-31 | Discharge: 2019-10-31 | Disposition: A | Discharge status: Home / Self Care | Payer: Medicare Other | Source: Ambulatory Visit | Attending: Internal Medicine | Admitting: Internal Medicine

## 2019-10-31 DIAGNOSIS — Z20822 Contact with and (suspected) exposure to covid-19: Secondary | ICD-10-CM | POA: Diagnosis not present

## 2019-10-31 DIAGNOSIS — Z01812 Encounter for preprocedural laboratory examination: Secondary | ICD-10-CM | POA: Diagnosis present

## 2019-10-31 LAB — SARS CORONAVIRUS 2 (TAT 6-24 HRS): SARS Coronavirus 2: NEGATIVE

## 2019-11-03 ENCOUNTER — Encounter (HOSPITAL_BASED_OUTPATIENT_CLINIC_OR_DEPARTMENT_OTHER): Payer: No Typology Code available for payment source | Admitting: Internal Medicine

## 2019-11-17 ENCOUNTER — Other Ambulatory Visit (HOSPITAL_COMMUNITY)
Admission: RE | Admit: 2019-11-17 | Discharge: 2019-11-17 | Disposition: A | Discharge status: Home / Self Care | Payer: Medicare Other | Source: Ambulatory Visit | Attending: Internal Medicine | Admitting: Internal Medicine

## 2019-11-17 DIAGNOSIS — Z20822 Contact with and (suspected) exposure to covid-19: Secondary | ICD-10-CM | POA: Insufficient documentation

## 2019-11-17 DIAGNOSIS — Z01812 Encounter for preprocedural laboratory examination: Secondary | ICD-10-CM | POA: Diagnosis present

## 2019-11-17 LAB — SARS CORONAVIRUS 2 (TAT 6-24 HRS): SARS Coronavirus 2: NEGATIVE

## 2019-11-19 ENCOUNTER — Ambulatory Visit (HOSPITAL_BASED_OUTPATIENT_CLINIC_OR_DEPARTMENT_OTHER): Payer: No Typology Code available for payment source | Attending: Otolaryngology | Admitting: Internal Medicine

## 2019-11-19 ENCOUNTER — Other Ambulatory Visit: Payer: Self-pay

## 2019-11-19 VITALS — Ht 71.0 in | Wt 285.0 lb

## 2019-11-19 DIAGNOSIS — G4733 Obstructive sleep apnea (adult) (pediatric): Secondary | ICD-10-CM | POA: Diagnosis present

## 2019-11-28 NOTE — Procedures (Signed)
Patient Name: Jonathon Bailey, Jonathon Bailey Date: 11/19/2019 Gender: Male D.O.B: [birth year] Age (years): 59 Referring Provider: Silvestre Moment MD Height (inches): 71 Interpreting Physician: Baird Lyons MD, ABSM Weight (lbs): 285 RPSGT: Baxter Flattery BMI: 40 MRN: [id number] Neck Size: 18.00  CLINICAL INFORMATION Sleep Study Type: Split Night CPAP Indication for sleep study: Fatigue, Obesity, OSA, Snoring, Witnessed Apneas Epworth Sleepiness Score: 10  SLEEP STUDY TECHNIQUE As per the AASM Manual for the Scoring of Sleep and Associated Events v2.3 (April 2016) with a hypopnea requiring 4% desaturations.  The channels recorded and monitored were frontal, central and occipital EEG, electrooculogram (EOG), submentalis EMG (chin), nasal and oral airflow, thoracic and abdominal wall motion, anterior tibialis EMG, snore microphone, electrocardiogram, and pulse oximetry. Continuous positive airway pressure (CPAP) was initiated when the patient met split night criteria and was titrated according to treat sleep-disordered breathing.  MEDICATIONS Medications self-administered by patient taken the night of the study : none reported  RESPIRATORY PARAMETERS Diagnostic  Total AHI (/hr): 50.2 RDI (/hr): 50.2 OA Index (/hr): 30.9 CA Index (/hr): 0.0 REM AHI (/hr): 73.6 NREM AHI (/hr): 42.3 Supine AHI (/hr): 44.5 Non-supine AHI (/hr): 57 Min O2 Sat (%): 66.0 Mean O2 (%): 87.5 Time below 88% (min): 70.9   Titration  Optimal Pressure (cm): 15 AHI at Optimal Pressure (/hr): 2.0 Min O2 at Optimal Pressure (%): 88.0 Supine % at Optimal (%): 100 Sleep % at Optimal (%): 99   SLEEP ARCHITECTURE The recording time for the entire night was 362.1 minutes.  During a baseline period of 193.3 minutes, the patient slept for 149.5 minutes in REM and nonREM, yielding a sleep efficiency of 77.3%%. Sleep onset after lights out was 37.7 minutes with a REM latency of 13.0 minutes. The patient spent 1.3%% of the night in  stage N1 sleep, 73.6%% in stage N2 sleep, 0.0%% in stage N3 and 25.1% in REM.  During the titration period of 164.7 minutes, the patient slept for 98.0 minutes in REM and nonREM, yielding a sleep efficiency of 59.5%%. Sleep onset after CPAP initiation was 63.8 minutes with a REM latency of 14.5 minutes. The patient spent 2.0%% of the night in stage N1 sleep, 78.1%% in stage N2 sleep, 0.0%% in stage N3 and 19.9% in REM.  CARDIAC DATA The 2 lead EKG demonstrated sinus rhythm. The mean heart rate was 100.0 beats per minute. Other EKG findings include: Sinus Tachycardia.  LEG MOVEMENT DATA The total Periodic Limb Movements of Sleep (PLMS) were 0. The PLMS index was 0.0 .  IMPRESSIONS - Severe obstructive sleep apnea occurred during the diagnostic portion of the study (AHI = 50.2/hour). An optimal PAP pressure was selected for this patient ( 15 cm of water) - No significant central sleep apnea occurred during the diagnostic portion of the study (CAI = 0.0/hour). - Oxygen desaturation was noted during the diagnostic portion of the study (Min O2 = 66.0%). Means sat on CPAP 15 was 91%. - No snoring was audible during the diagnostic portion of the study. - EKG findings include Sinus Tachycardia. - Clinically significant periodic limb movements did not occur during sleep.  DIAGNOSIS - Obstructive Sleep Apnea (327.23 [G47.33 ICD-10])  RECOMMENDATIONS - Trial of CPAP therapy on 15 cm H2O or autopap 10-20. - Patient used a Large size Fisher&Paykel Full Face Mask F&P Vitera (new) mask and heated humidification. - Be careful with alcohol, sedatives and other CNS depressants that may worsen sleep apnea and disrupt normal sleep architecture. - Sleep hygiene should be reviewed to  assess factors that may improve sleep quality. - Weight management and regular exercise should be initiated or continued.  [Electronically signed] 11/28/2019 11:54 AM  Baird Lyons MD, ABSM Diplomate, American Board of Sleep  Medicine   NPI: [id number]                          [place]  ELECTRONICALLY SIGNED ON:  11/28/2019, 11:55 AM [place] PH: [phone]   FX: [phone] [place]

## 2019-11-29 ENCOUNTER — Telehealth: Payer: Self-pay | Admitting: Family Medicine

## 2019-11-29 DIAGNOSIS — G4733 Obstructive sleep apnea (adult) (pediatric): Secondary | ICD-10-CM

## 2019-11-29 NOTE — Telephone Encounter (Signed)
Patient with OSA on sleep study. DME CPAP ordered.

## 2019-12-01 ENCOUNTER — Telehealth: Payer: Self-pay

## 2019-12-01 NOTE — Telephone Encounter (Signed)
Patient calls nurse line requesting results of his sleep study. Patient informed and informed of need for Cpap, and that one has been ordered for him. Patient stated the result of the sleep study needs to go to [facility], they will provide him with a Cpap. I called to get the fax number, however they close at [phone]. Will try tomorrow. [phone]

## 2019-12-04 NOTE — Telephone Encounter (Signed)
Sleep Study faxed to patients PCP at the [place], Dr. Marjo Bicker.   [phone]- fax number.

## 2020-04-22 ENCOUNTER — Other Ambulatory Visit: Payer: Self-pay

## 2020-04-22 DIAGNOSIS — I739 Peripheral vascular disease, unspecified: Secondary | ICD-10-CM

## 2020-04-27 ENCOUNTER — Encounter (HOSPITAL_COMMUNITY): Payer: Self-pay | Admitting: Emergency Medicine

## 2020-04-27 ENCOUNTER — Inpatient Hospital Stay (HOSPITAL_COMMUNITY)
Admission: EM | Admit: 2020-04-27 | Discharge: 2020-05-06 | DRG: 853 | Disposition: A | Discharge status: Rehabilitation Facility | Payer: No Typology Code available for payment source | Attending: Family Medicine | Admitting: Family Medicine

## 2020-04-27 ENCOUNTER — Other Ambulatory Visit: Payer: Self-pay

## 2020-04-27 ENCOUNTER — Ambulatory Visit (HOSPITAL_COMMUNITY)
Admission: EM | Admit: 2020-04-27 | Discharge: 2020-04-27 | Disposition: A | Discharge status: Home / Self Care | Payer: No Typology Code available for payment source | Attending: Family Medicine | Admitting: Family Medicine

## 2020-04-27 ENCOUNTER — Emergency Department (HOSPITAL_COMMUNITY)
Admission: EM | Admit: 2020-04-27 | Discharge: 2020-04-27 | Disposition: A | Discharge status: Home / Self Care | Payer: No Typology Code available for payment source | Source: Home / Self Care

## 2020-04-27 ENCOUNTER — Emergency Department (HOSPITAL_COMMUNITY): Payer: No Typology Code available for payment source

## 2020-04-27 DIAGNOSIS — A419 Sepsis, unspecified organism: Secondary | ICD-10-CM | POA: Diagnosis not present

## 2020-04-27 DIAGNOSIS — G546 Phantom limb syndrome with pain: Secondary | ICD-10-CM | POA: Diagnosis not present

## 2020-04-27 DIAGNOSIS — K5909 Other constipation: Secondary | ICD-10-CM | POA: Diagnosis present

## 2020-04-27 DIAGNOSIS — Z8249 Family history of ischemic heart disease and other diseases of the circulatory system: Secondary | ICD-10-CM

## 2020-04-27 DIAGNOSIS — R109 Unspecified abdominal pain: Secondary | ICD-10-CM

## 2020-04-27 DIAGNOSIS — I998 Other disorder of circulatory system: Secondary | ICD-10-CM

## 2020-04-27 DIAGNOSIS — Z89512 Acquired absence of left leg below knee: Secondary | ICD-10-CM

## 2020-04-27 DIAGNOSIS — G4733 Obstructive sleep apnea (adult) (pediatric): Secondary | ICD-10-CM | POA: Diagnosis present

## 2020-04-27 DIAGNOSIS — N186 End stage renal disease: Secondary | ICD-10-CM | POA: Diagnosis present

## 2020-04-27 DIAGNOSIS — I252 Old myocardial infarction: Secondary | ICD-10-CM

## 2020-04-27 DIAGNOSIS — Z825 Family history of asthma and other chronic lower respiratory diseases: Secondary | ICD-10-CM

## 2020-04-27 DIAGNOSIS — L0390 Cellulitis, unspecified: Secondary | ICD-10-CM | POA: Diagnosis not present

## 2020-04-27 DIAGNOSIS — G8918 Other acute postprocedural pain: Secondary | ICD-10-CM

## 2020-04-27 DIAGNOSIS — Z951 Presence of aortocoronary bypass graft: Secondary | ICD-10-CM

## 2020-04-27 DIAGNOSIS — Z79899 Other long term (current) drug therapy: Secondary | ICD-10-CM

## 2020-04-27 DIAGNOSIS — N2581 Secondary hyperparathyroidism of renal origin: Secondary | ICD-10-CM | POA: Diagnosis not present

## 2020-04-27 DIAGNOSIS — D62 Acute posthemorrhagic anemia: Secondary | ICD-10-CM | POA: Diagnosis present

## 2020-04-27 DIAGNOSIS — E785 Hyperlipidemia, unspecified: Secondary | ICD-10-CM | POA: Diagnosis present

## 2020-04-27 DIAGNOSIS — E1142 Type 2 diabetes mellitus with diabetic polyneuropathy: Secondary | ICD-10-CM | POA: Diagnosis present

## 2020-04-27 DIAGNOSIS — Z20822 Contact with and (suspected) exposure to covid-19: Secondary | ICD-10-CM | POA: Diagnosis present

## 2020-04-27 DIAGNOSIS — Z955 Presence of coronary angioplasty implant and graft: Secondary | ICD-10-CM

## 2020-04-27 DIAGNOSIS — D649 Anemia, unspecified: Secondary | ICD-10-CM

## 2020-04-27 DIAGNOSIS — R1314 Dysphagia, pharyngoesophageal phase: Secondary | ICD-10-CM | POA: Diagnosis present

## 2020-04-27 DIAGNOSIS — E11311 Type 2 diabetes mellitus with unspecified diabetic retinopathy with macular edema: Secondary | ICD-10-CM | POA: Diagnosis present

## 2020-04-27 DIAGNOSIS — Z823 Family history of stroke: Secondary | ICD-10-CM

## 2020-04-27 DIAGNOSIS — D631 Anemia in chronic kidney disease: Secondary | ICD-10-CM

## 2020-04-27 DIAGNOSIS — Z888 Allergy status to other drugs, medicaments and biological substances status: Secondary | ICD-10-CM

## 2020-04-27 DIAGNOSIS — I69391 Dysphagia following cerebral infarction: Secondary | ICD-10-CM

## 2020-04-27 DIAGNOSIS — D638 Anemia in other chronic diseases classified elsewhere: Secondary | ICD-10-CM

## 2020-04-27 DIAGNOSIS — I2510 Atherosclerotic heart disease of native coronary artery without angina pectoris: Secondary | ICD-10-CM | POA: Diagnosis present

## 2020-04-27 DIAGNOSIS — I96 Gangrene, not elsewhere classified: Secondary | ICD-10-CM

## 2020-04-27 DIAGNOSIS — Z992 Dependence on renal dialysis: Secondary | ICD-10-CM

## 2020-04-27 DIAGNOSIS — E8889 Other specified metabolic disorders: Secondary | ICD-10-CM | POA: Diagnosis present

## 2020-04-27 DIAGNOSIS — Z5321 Procedure and treatment not carried out due to patient leaving prior to being seen by health care provider: Secondary | ICD-10-CM | POA: Insufficient documentation

## 2020-04-27 DIAGNOSIS — Z794 Long term (current) use of insulin: Secondary | ICD-10-CM

## 2020-04-27 DIAGNOSIS — E11621 Type 2 diabetes mellitus with foot ulcer: Secondary | ICD-10-CM | POA: Diagnosis present

## 2020-04-27 DIAGNOSIS — Z833 Family history of diabetes mellitus: Secondary | ICD-10-CM

## 2020-04-27 DIAGNOSIS — I70262 Atherosclerosis of native arteries of extremities with gangrene, left leg: Secondary | ICD-10-CM | POA: Diagnosis present

## 2020-04-27 DIAGNOSIS — E1152 Type 2 diabetes mellitus with diabetic peripheral angiopathy with gangrene: Secondary | ICD-10-CM | POA: Diagnosis present

## 2020-04-27 DIAGNOSIS — Z87891 Personal history of nicotine dependence: Secondary | ICD-10-CM

## 2020-04-27 DIAGNOSIS — R42 Dizziness and giddiness: Secondary | ICD-10-CM | POA: Diagnosis not present

## 2020-04-27 DIAGNOSIS — J449 Chronic obstructive pulmonary disease, unspecified: Secondary | ICD-10-CM | POA: Diagnosis present

## 2020-04-27 DIAGNOSIS — E1122 Type 2 diabetes mellitus with diabetic chronic kidney disease: Secondary | ICD-10-CM | POA: Diagnosis present

## 2020-04-27 DIAGNOSIS — I69351 Hemiplegia and hemiparesis following cerebral infarction affecting right dominant side: Secondary | ICD-10-CM

## 2020-04-27 DIAGNOSIS — I132 Hypertensive heart and chronic kidney disease with heart failure and with stage 5 chronic kidney disease, or end stage renal disease: Secondary | ICD-10-CM | POA: Diagnosis present

## 2020-04-27 DIAGNOSIS — I9589 Other hypotension: Secondary | ICD-10-CM | POA: Diagnosis not present

## 2020-04-27 DIAGNOSIS — Z6839 Body mass index (BMI) 39.0-39.9, adult: Secondary | ICD-10-CM

## 2020-04-27 DIAGNOSIS — I739 Peripheral vascular disease, unspecified: Secondary | ICD-10-CM

## 2020-04-27 DIAGNOSIS — E1165 Type 2 diabetes mellitus with hyperglycemia: Secondary | ICD-10-CM | POA: Diagnosis present

## 2020-04-27 DIAGNOSIS — I350 Nonrheumatic aortic (valve) stenosis: Secondary | ICD-10-CM | POA: Diagnosis present

## 2020-04-27 DIAGNOSIS — Z83438 Family history of other disorder of lipoprotein metabolism and other lipidemia: Secondary | ICD-10-CM

## 2020-04-27 DIAGNOSIS — E118 Type 2 diabetes mellitus with unspecified complications: Secondary | ICD-10-CM | POA: Diagnosis present

## 2020-04-27 DIAGNOSIS — L97529 Non-pressure chronic ulcer of other part of left foot with unspecified severity: Secondary | ICD-10-CM | POA: Diagnosis present

## 2020-04-27 DIAGNOSIS — F419 Anxiety disorder, unspecified: Secondary | ICD-10-CM | POA: Diagnosis present

## 2020-04-27 DIAGNOSIS — I5042 Chronic combined systolic (congestive) and diastolic (congestive) heart failure: Secondary | ICD-10-CM | POA: Diagnosis present

## 2020-04-27 DIAGNOSIS — M79675 Pain in left toe(s): Secondary | ICD-10-CM | POA: Insufficient documentation

## 2020-04-27 DIAGNOSIS — Z91041 Radiographic dye allergy status: Secondary | ICD-10-CM

## 2020-04-27 DIAGNOSIS — Z7982 Long term (current) use of aspirin: Secondary | ICD-10-CM

## 2020-04-27 DIAGNOSIS — F329 Major depressive disorder, single episode, unspecified: Secondary | ICD-10-CM | POA: Diagnosis present

## 2020-04-27 DIAGNOSIS — L03116 Cellulitis of left lower limb: Secondary | ICD-10-CM | POA: Diagnosis present

## 2020-04-27 LAB — CBC
HCT: 28.4 % — ABNORMAL LOW (ref 39.0–52.0)
Hemoglobin: 8.4 g/dL — ABNORMAL LOW (ref 13.0–17.0)
MCH: 26.5 pg (ref 26.0–34.0)
MCHC: 29.6 g/dL — ABNORMAL LOW (ref 30.0–36.0)
MCV: 89.6 fL (ref 80.0–100.0)
Platelets: 366 10*3/uL (ref 150–400)
RBC: 3.17 MIL/uL — ABNORMAL LOW (ref 4.22–5.81)
RDW: 17 % — ABNORMAL HIGH (ref 11.5–15.5)
WBC: 11.4 10*3/uL — ABNORMAL HIGH (ref 4.0–10.5)
nRBC: 0 % (ref 0.0–0.2)

## 2020-04-27 NOTE — ED Notes (Signed)
Called pt x2 for vitals, no response. °

## 2020-04-27 NOTE — ED Notes (Signed)
Called 2X= no answer

## 2020-04-27 NOTE — ED Triage Notes (Signed)
Pt presents to ED POV. Pt c/o toe pain.Pt reports taht toe began to get infect in sept and has worsened. Pt seen at urgent care and sent here. Pt's second toe on L foot is nonblanchable and black at tip

## 2020-04-27 NOTE — ED Triage Notes (Signed)
Patient reports he had some dead skin on his L 2nd toe that he rubbed off, states now has irritation to 2nd and 3rd L toes for the past few weeks. Has been putting antibiotic gel and iodine on the area. Went to the [place] but states they are not taking it serious so wants further evaluation.

## 2020-04-27 NOTE — ED Notes (Signed)
No reply for room 

## 2020-04-27 NOTE — ED Provider Notes (Signed)
[place]    CSN: [id number] Arrival date & time: 04/27/20  1947      History   Chief Complaint Chief Complaint  Patient presents with   Toe Pain    HPI Jonathon Bailey is a 64 y.o. male.   Jonathon Bailey presents with complaints of increased redness, swelling and pain to left foot. He states that around 3 weeks ago he "rubbed dead skin off" but it increased in redness since then. He went to the [place] and states that he wasn't given the antibiotics he thought they were providing. Worsening. He states he can't see well to thoroughly assess but no known drainage. History  Of ESRD, htn, CAD, CHF.     ROS per HPI, negative if not otherwise mentioned.      Past Medical History:  Diagnosis Date   Anxiety    Arthritis    Asthma    "as a child"   CAD (coronary artery disease)    Chronic systolic heart failure ([place]) 09/21/2008   ECHO Feb 2013 showed LVEF low normal at 50-55%, +hypokinetic anterolateral wall and inferolateral wall.     CKD (chronic kidney disease) stage 4, GFR 15-29 ml/min (HCC) 08/30/2009   Progressive renal failure since 2008, creatinine 1.2 in 2008 up to 3.5 in 2012 and 3.2-5.0 in 2013. All UA's 2011-13 showed >300 protein on dipstick. Work-up in May 2011 showed negative Urine IFE and SPEP, ultrasound showed 12-13 cm kidneys with increased echogenicity and UPC ratio was 1.5 gm proteinuria.  Hgb A1C's from 2011 to 2013 were all between 9-11.  Patient saw Dr. Donnetta Hutching (vasc surgery) for HD access in Aug 2013 > vein mapping was done and Dr. Donnetta Hutching felt the left arm (pt is R handed) was suitable for L arm Cimino radiocephalic fistula. Patient said he wasn't ready to consider doing dialysis and declined the surgery.      Depression    Diabetic retinopathy ([place])    ESRD (end stage renal disease) on dialysis ([place])    Headache(784.0)    "maybe monthly" (03/15/2014)   Hyperlipidemia    Hypertension    Macular edema    Myocardial infarction  [facility])    status post MI x2 and 3 stents placed in 2003   Obesity    Sleep apnea    does not use CPAP   Stroke ([place]) ~ 2007; ~1987   "weak on right side; messed w/right side of brain; cry all the time"   Type II diabetes mellitus (HCC)    insulin dependent    Patient Active Problem List   Diagnosis Date Noted   Diabetic retinopathy ([place]) 08/21/2016   Other specified fever    Generalized abdominal pain    Acute lower GI bleeding    ESRD (end stage renal disease) ([place])    Bacterial peritonitis ([place])    Blood poisoning    ESRD on peritoneal dialysis ([place])    Acute blood loss anemia    Abdominal pain 09/01/2014   CAPD (continuous ambulatory peritoneal dialysis) status 03/15/2014   Morbid obesity ([place]) 02/01/2014   Recurrent incisional hernia x5 with incarceration s/p lap repair w mesh 03/15/2014 02/01/2014   Preventative health care 93/71/6967   Other complications due to renal dialysis device, implant, and graft 10/24/2012   COPD (chronic obstructive pulmonary disease) ([place]) 10/23/2012   DIABETIC PERIPHERAL NEUROPATHY 01/09/2010   ESRD (end stage renal disease) on dialysis ([place]) 08/30/2009   CEREBROVASCULAR ACCIDENT, HX OF 08/30/2009   Diabetes mellitus  type 2 with complications ([place]) 62/37/6283   HYPERLIPIDEMIA 09/21/2008   OBSTRUCTIVE SLEEP APNEA 09/21/2008   ESSENTIAL HYPERTENSION 09/21/2008   CORONARY ARTERY DISEASE 09/21/2008   Chronic combined systolic and diastolic heart failure ([place]) 09/21/2008    Past Surgical History:  Procedure Laterality Date   AV FISTULA PLACEMENT  10/17/2012   Procedure: ARTERIOVENOUS (AV) FISTULA CREATION;  Surgeon: Mal Misty, MD;  Location: [place];  Service: Vascular;  Laterality: Left;   BONE EXOSTOSIS EXCISION Right 06/04/2019   Procedure: EXOSTOSIS EXCISION;  Surgeon: Leanora Cover, MD;  Location: [place];  Service: Orthopedics;  Laterality: Right;   CAPD INSERTION N/A 03/15/2014    Procedure: LAPAROSCOPIC INSERTION CONTINUOUS AMBULATORY PERITONEAL DIALYSIS CATHETER, LAPARASCOPIC INCISIONAL HERNIA  REPAIR  WITH MESH, OMENTOPEXY AND LYSIS OF ADHESIONS;  Surgeon: [name], MD;  Location: [place];  Service: General;  Laterality: N/A;   CORONARY ANGIOPLASTY WITH STENT PLACEMENT  ~ 2002   "3"   CORONARY ANGIOPLASTY WITH STENT PLACEMENT  01/24/2004   successful PCI/stenting RCA  drug eluting cypher stent   CORONARY ANGIOPLASTY WITH STENT PLACEMENT  01/28/2004   successful stentin of a large bifurcation marginal branch of the ramus intermediate vessel   FLEXIBLE SIGMOIDOSCOPY N/A 09/05/2014   Procedure: FLEXIBLE SIGMOIDOSCOPY;  Surgeon: Cleotis Nipper, MD;  Location: [place];  Service: Endoscopy;  Laterality: N/A;   HERNIA REPAIR     INCISION AND DRAINAGE ABSCESS N/A 09/06/2014   Procedure: REMOVAL OF PD CATH;  Surgeon: Coralie Keens, MD;  Location: [place];  Service: General;  Laterality: N/A;   INCISIONAL HERNIA REPAIR  03/15/2014   LAPAROSCOPIC LYSIS OF ADHESIONS  03/15/2014   NM MYOCAR PERF WALL MOTION  02/06/2012   normal perfusion scan   PERITONEAL CATHETER INSERTION  03/15/2014   REFRACTIVE SURGERY Bilateral    SHUNTOGRAM N/A 03/11/2013   Procedure: Fistulogram;  Surgeon: Conrad Havre, MD;  Location: [facility] CATH LAB;  Service: Cardiovascular;  Laterality: N/A;   TENDON TRANSFER Right 06/04/2019   Procedure: RIGHT HAND EXTENSOR TENDON TRANSFER TO SMALL FINGER;  Surgeon: Leanora Cover, MD;  Location: [place];  Service: Orthopedics;  Laterality: Right;   UMBILICAL HERNIA REPAIR         Home Medications    Prior to Admission medications   Medication Sig Start Date End Date Taking? Authorizing Provider  albuterol (PROVENTIL HFA;VENTOLIN HFA) 108 (90 BASE) MCG/ACT inhaler Inhale 2 puffs into the lungs every 4 (four) hours as needed for wheezing. For wheezing    [provider]  aspirin 325 MG tablet Take 325 mg by mouth  daily.    [provider]  atorvastatin (LIPITOR) 40 MG tablet Take 40 mg by mouth at bedtime.    [provider]  B Complex-C-Biotin-E-Min-FA (DIALYVITE 5000 PO) Take by mouth.    [provider]  benzonatate (TESSALON) 100 MG capsule Take by mouth 3 (three) times daily as needed for cough.    [provider]  carvedilol (COREG) 25 MG tablet Take 37.5 mg by mouth 2 (two) times daily with a meal.    [provider]  cinacalcet (SENSIPAR) 60 MG tablet Take 60 mg by mouth daily.    [provider]  fluticasone (FLONASE) 50 MCG/ACT nasal spray Place 2 sprays into both nostrils daily.    [provider]  hydrALAZINE (APRESOLINE) 50 MG tablet Take 50 mg by mouth 3 (three) times daily.    [provider]  insulin detemir (  LEVEMIR) 100 UNIT/ML injection Inject 75 Units into the skin 2 (two) times daily.    [provider]  insulin lispro (HUMALOG) 100 UNIT/ML injection Inject into the skin 3 (three) times daily with meals.    [provider]  oxyCODONE (ROXICODONE) 5 MG immediate release tablet 1-2 tabs PO q6 hours prn pain 06/04/19   Leanora Cover, MD  polyethylene glycol (MIRALAX / GLYCOLAX) packet Take 17 g by mouth daily. 09/08/14   Janora Norlander, DO  tiotropium (SPIRIVA) 18 MCG inhalation capsule Place 18 mcg into inhaler and inhale daily as needed (for shortness of breath).     [provider]  tobramycin (TOBREX) 0.3 % ophthalmic solution Place 1 drop into both eyes every 6 (six) hours as needed (for eye irritation).     [provider]    Family History Family History  Problem Relation Age of Onset   Asthma Mother    Hyperlipidemia Mother    Hypertension Mother    Stroke Father    Heart attack Father    Prostate cancer Father    Deep vein thrombosis Father    Cancer Father    Diabetes Father    Hyperlipidemia Father    Hypertension Father    Other Father         varicose veins   Heart disease Father        before age 35   Other Sister        varicose veins    Social History Social History   Tobacco Use   Smoking status: Former Smoker    Packs/day: 1.00    Years: 15.00    Pack years: 15.00    Types: Cigarettes    Quit date: 09/24/1986    Years since quitting: 33.6   Smokeless tobacco: Never Used  Substance Use Topics   Alcohol use: Not Currently    Comment: stopped in 2013   Drug use: No     Allergies   Feraheme [ferumoxytol]   Review of Systems Review of Systems   Physical Exam Triage Vital Signs ED Triage Vitals  Enc Vitals Group     BP 04/27/20 2028 (!) 148/90     Pulse Rate 04/27/20 2028 90     Resp 04/27/20 2028 18     Temp 04/27/20 2028 99.7 F (37.6 C)     Temp Source 04/27/20 2028 Oral     SpO2 04/27/20 2028 95 %     Weight --      Height --      Head Circumference --      Peak Flow --      Pain Score 04/27/20 2025 0     Pain Loc --      Pain Edu? --      Excl. in [place]? --    No data found.  Updated Vital Signs BP (!) 148/90 (BP Location: Right Arm)    Pulse 90    Temp 99.7 F (37.6 C) (Oral)    Resp 18    SpO2 95%   Visual Acuity Right Eye Distance:   Left Eye Distance:   Bilateral Distance:    Right Eye Near:   Left Eye Near:    Bilateral Near:     Physical Exam Constitutional:      Appearance: He is well-developed.  Cardiovascular:     Rate and Rhythm: Normal rate.  Pulmonary:     Effort: Pulmonary effort is normal.  Musculoskeletal:  Left foot: Decreased range of motion.  Feet:     Comments: See photo; left foot with redness, swelling, sloughing skin, black second toe, some moisture but no visible drainage or significant odor; decreased sensation Skin:    General: Skin is warm and dry.  Neurological:     Mental Status: He is alert and oriented to person, place, and time.            UC Treatments / Results  Labs (all labs ordered are listed, but only abnormal  results are displayed) Labs Reviewed - No data to display  EKG   Radiology No results found.  Procedures Procedures (including critical care time)  Medications Ordered in UC Medications - No data to display  Initial Impression / Assessment and Plan / UC Course  I have reviewed the triage vital signs and the nursing notes.  Pertinent labs & imaging results that were available during my care of the patient were reviewed by me and considered in my medical decision making (see chart for details).     Necrotic toe, concern about osteomyelitis in this high risk patient. Patient recommended to go to the ER for further evaluation and treatment.  Final Clinical Impressions(s) / UC Diagnoses   Final diagnoses:  Peripheral artery disease ([place])  Black toe ([place])     Discharge Instructions     Please go to the ER now for management of this, as I am very worried about your toe    ED Prescriptions    None     PDMP not reviewed this encounter.   Zigmund Gottron, NP 04/27/20 [phone]

## 2020-04-27 NOTE — ED Triage Notes (Signed)
Pt presents with toe pain in left foot xs 3-4 weeks ago. Noticed dead skin on second toe and scrubs skin off, now has turned to wound.

## 2020-04-27 NOTE — Discharge Instructions (Signed)
Please go to the ER now for management of this, as I am very worried about your toe

## 2020-04-28 ENCOUNTER — Encounter (HOSPITAL_COMMUNITY): Payer: Self-pay | Admitting: Family Medicine

## 2020-04-28 DIAGNOSIS — E1165 Type 2 diabetes mellitus with hyperglycemia: Secondary | ICD-10-CM | POA: Diagnosis not present

## 2020-04-28 DIAGNOSIS — A419 Sepsis, unspecified organism: Secondary | ICD-10-CM | POA: Diagnosis present

## 2020-04-28 DIAGNOSIS — R0989 Other specified symptoms and signs involving the circulatory and respiratory systems: Secondary | ICD-10-CM | POA: Diagnosis not present

## 2020-04-28 DIAGNOSIS — Z79899 Other long term (current) drug therapy: Secondary | ICD-10-CM | POA: Diagnosis not present

## 2020-04-28 DIAGNOSIS — L03116 Cellulitis of left lower limb: Secondary | ICD-10-CM | POA: Diagnosis present

## 2020-04-28 DIAGNOSIS — I739 Peripheral vascular disease, unspecified: Secondary | ICD-10-CM | POA: Diagnosis not present

## 2020-04-28 DIAGNOSIS — I69351 Hemiplegia and hemiparesis following cerebral infarction affecting right dominant side: Secondary | ICD-10-CM | POA: Diagnosis not present

## 2020-04-28 DIAGNOSIS — D631 Anemia in chronic kidney disease: Secondary | ICD-10-CM | POA: Diagnosis present

## 2020-04-28 DIAGNOSIS — I9589 Other hypotension: Secondary | ICD-10-CM | POA: Diagnosis present

## 2020-04-28 DIAGNOSIS — I96 Gangrene, not elsewhere classified: Secondary | ICD-10-CM | POA: Diagnosis present

## 2020-04-28 DIAGNOSIS — D62 Acute posthemorrhagic anemia: Secondary | ICD-10-CM | POA: Diagnosis present

## 2020-04-28 DIAGNOSIS — I4510 Unspecified right bundle-branch block: Secondary | ICD-10-CM | POA: Diagnosis not present

## 2020-04-28 DIAGNOSIS — Z888 Allergy status to other drugs, medicaments and biological substances status: Secondary | ICD-10-CM | POA: Diagnosis not present

## 2020-04-28 DIAGNOSIS — E118 Type 2 diabetes mellitus with unspecified complications: Secondary | ICD-10-CM | POA: Diagnosis not present

## 2020-04-28 DIAGNOSIS — Z9119 Patient's noncompliance with other medical treatment and regimen: Secondary | ICD-10-CM | POA: Diagnosis not present

## 2020-04-28 DIAGNOSIS — Z89512 Acquired absence of left leg below knee: Secondary | ICD-10-CM | POA: Diagnosis not present

## 2020-04-28 DIAGNOSIS — Z951 Presence of aortocoronary bypass graft: Secondary | ICD-10-CM | POA: Diagnosis not present

## 2020-04-28 DIAGNOSIS — D72829 Elevated white blood cell count, unspecified: Secondary | ICD-10-CM | POA: Diagnosis not present

## 2020-04-28 DIAGNOSIS — Z794 Long term (current) use of insulin: Secondary | ICD-10-CM | POA: Diagnosis not present

## 2020-04-28 DIAGNOSIS — D649 Anemia, unspecified: Secondary | ICD-10-CM | POA: Diagnosis not present

## 2020-04-28 DIAGNOSIS — I70262 Atherosclerosis of native arteries of extremities with gangrene, left leg: Secondary | ICD-10-CM | POA: Diagnosis present

## 2020-04-28 DIAGNOSIS — N186 End stage renal disease: Secondary | ICD-10-CM | POA: Diagnosis not present

## 2020-04-28 DIAGNOSIS — Z4781 Encounter for orthopedic aftercare following surgical amputation: Secondary | ICD-10-CM | POA: Diagnosis present

## 2020-04-28 DIAGNOSIS — Z992 Dependence on renal dialysis: Secondary | ICD-10-CM | POA: Diagnosis not present

## 2020-04-28 DIAGNOSIS — I2510 Atherosclerotic heart disease of native coronary artery without angina pectoris: Secondary | ICD-10-CM

## 2020-04-28 DIAGNOSIS — Z955 Presence of coronary angioplasty implant and graft: Secondary | ICD-10-CM | POA: Diagnosis not present

## 2020-04-28 DIAGNOSIS — K5903 Drug induced constipation: Secondary | ICD-10-CM | POA: Diagnosis not present

## 2020-04-28 DIAGNOSIS — R1314 Dysphagia, pharyngoesophageal phase: Secondary | ICD-10-CM | POA: Diagnosis present

## 2020-04-28 DIAGNOSIS — J9811 Atelectasis: Secondary | ICD-10-CM | POA: Diagnosis present

## 2020-04-28 DIAGNOSIS — I252 Old myocardial infarction: Secondary | ICD-10-CM | POA: Diagnosis not present

## 2020-04-28 DIAGNOSIS — E1152 Type 2 diabetes mellitus with diabetic peripheral angiopathy with gangrene: Secondary | ICD-10-CM | POA: Diagnosis present

## 2020-04-28 DIAGNOSIS — Z20822 Contact with and (suspected) exposure to covid-19: Secondary | ICD-10-CM | POA: Diagnosis present

## 2020-04-28 DIAGNOSIS — L0390 Cellulitis, unspecified: Secondary | ICD-10-CM | POA: Diagnosis present

## 2020-04-28 DIAGNOSIS — E1122 Type 2 diabetes mellitus with diabetic chronic kidney disease: Secondary | ICD-10-CM | POA: Diagnosis present

## 2020-04-28 DIAGNOSIS — I10 Essential (primary) hypertension: Secondary | ICD-10-CM | POA: Diagnosis not present

## 2020-04-28 DIAGNOSIS — E1142 Type 2 diabetes mellitus with diabetic polyneuropathy: Secondary | ICD-10-CM | POA: Diagnosis present

## 2020-04-28 DIAGNOSIS — Z01810 Encounter for preprocedural cardiovascular examination: Secondary | ICD-10-CM | POA: Diagnosis not present

## 2020-04-28 DIAGNOSIS — G8918 Other acute postprocedural pain: Secondary | ICD-10-CM | POA: Diagnosis not present

## 2020-04-28 DIAGNOSIS — I998 Other disorder of circulatory system: Secondary | ICD-10-CM | POA: Diagnosis not present

## 2020-04-28 DIAGNOSIS — I350 Nonrheumatic aortic (valve) stenosis: Secondary | ICD-10-CM | POA: Diagnosis present

## 2020-04-28 DIAGNOSIS — E11311 Type 2 diabetes mellitus with unspecified diabetic retinopathy with macular edema: Secondary | ICD-10-CM | POA: Diagnosis present

## 2020-04-28 DIAGNOSIS — I132 Hypertensive heart and chronic kidney disease with heart failure and with stage 5 chronic kidney disease, or end stage renal disease: Secondary | ICD-10-CM | POA: Diagnosis present

## 2020-04-28 DIAGNOSIS — R140 Abdominal distension (gaseous): Secondary | ICD-10-CM | POA: Diagnosis not present

## 2020-04-28 DIAGNOSIS — I5022 Chronic systolic (congestive) heart failure: Secondary | ICD-10-CM

## 2020-04-28 DIAGNOSIS — D638 Anemia in other chronic diseases classified elsewhere: Secondary | ICD-10-CM | POA: Diagnosis not present

## 2020-04-28 DIAGNOSIS — N2581 Secondary hyperparathyroidism of renal origin: Secondary | ICD-10-CM | POA: Diagnosis present

## 2020-04-28 DIAGNOSIS — Z91041 Radiographic dye allergy status: Secondary | ICD-10-CM | POA: Diagnosis not present

## 2020-04-28 DIAGNOSIS — I5042 Chronic combined systolic (congestive) and diastolic (congestive) heart failure: Secondary | ICD-10-CM | POA: Diagnosis present

## 2020-04-28 DIAGNOSIS — J449 Chronic obstructive pulmonary disease, unspecified: Secondary | ICD-10-CM | POA: Diagnosis present

## 2020-04-28 LAB — BASIC METABOLIC PANEL
Anion gap: 17 — ABNORMAL HIGH (ref 5–15)
BUN: 48 mg/dL — ABNORMAL HIGH (ref 8–23)
CO2: 25 mmol/L (ref 22–32)
Calcium: 9.2 mg/dL (ref 8.9–10.3)
Chloride: 91 mmol/L — ABNORMAL LOW (ref 98–111)
Creatinine, Ser: 10.73 mg/dL — ABNORMAL HIGH (ref 0.61–1.24)
GFR calc Af Amer: 5 mL/min — ABNORMAL LOW (ref >60)
GFR calc non Af Amer: 5 mL/min — ABNORMAL LOW (ref >60)
Glucose, Bld: 148 mg/dL — ABNORMAL HIGH (ref 70–99)
Potassium: 4.6 mmol/L (ref 3.5–5.1)
Sodium: 133 mmol/L — ABNORMAL LOW (ref 135–145)

## 2020-04-28 LAB — CBC
HCT: 30.2 % — ABNORMAL LOW (ref 39.0–52.0)
Hemoglobin: 9.1 g/dL — ABNORMAL LOW (ref 13.0–17.0)
MCH: 26.2 pg (ref 26.0–34.0)
MCHC: 30.1 g/dL (ref 30.0–36.0)
MCV: 87 fL (ref 80.0–100.0)
Platelets: 387 10*3/uL (ref 150–400)
RBC: 3.47 MIL/uL — ABNORMAL LOW (ref 4.22–5.81)
RDW: 16.9 % — ABNORMAL HIGH (ref 11.5–15.5)
WBC: 12 10*3/uL — ABNORMAL HIGH (ref 4.0–10.5)
nRBC: 0 % (ref 0.0–0.2)

## 2020-04-28 LAB — HIV ANTIBODY (ROUTINE TESTING W REFLEX): HIV Screen 4th Generation wRfx: NONREACTIVE

## 2020-04-28 LAB — CBG MONITORING, ED: Glucose-Capillary: 128 mg/dL — ABNORMAL HIGH (ref 70–99)

## 2020-04-28 LAB — SARS CORONAVIRUS 2 BY RT PCR (HOSPITAL ORDER, PERFORMED IN ~~LOC~~ HOSPITAL LAB): SARS Coronavirus 2: NEGATIVE

## 2020-04-28 LAB — LACTIC ACID, PLASMA: Lactic Acid, Venous: 1.3 mmol/L (ref 0.5–1.9)

## 2020-04-28 MED ORDER — CINACALCET HCL 30 MG PO TABS
90.0000 mg | ORAL_TABLET | Freq: Every day | ORAL | Status: DC
  Administered 2020-04-29 – 2020-05-04 (×6): 90 mg via ORAL
  Filled 2020-04-28 (×9): qty 3

## 2020-04-28 MED ORDER — ACETAMINOPHEN 325 MG PO TABS
650.0000 mg | ORAL_TABLET | Freq: Four times a day (QID) | ORAL | Status: DC | PRN
  Administered 2020-04-28 – 2020-05-01 (×4): 650 mg via ORAL
  Filled 2020-04-28 (×4): qty 2

## 2020-04-28 MED ORDER — HEPARIN SODIUM (PORCINE) 1000 UNIT/ML DIALYSIS
1000.0000 [IU] | INTRAMUSCULAR | Status: DC | PRN
  Filled 2020-04-28 (×2): qty 1

## 2020-04-28 MED ORDER — PIPERACILLIN-TAZOBACTAM 3.375 G IVPB 30 MIN: 3.3750 g | Freq: Once | INTRAVENOUS | Status: DC

## 2020-04-28 MED ORDER — ACETAMINOPHEN 650 MG RE SUPP: 650.0000 mg | Freq: Four times a day (QID) | RECTAL | Status: DC | PRN

## 2020-04-28 MED ORDER — SEVELAMER CARBONATE 2.4 G PO PACK
4.8000 g | PACK | Freq: Three times a day (TID) | ORAL | Status: DC
  Administered 2020-04-29 – 2020-05-06 (×16): 4.8 g via ORAL
  Filled 2020-04-28 (×23): qty 2

## 2020-04-28 MED ORDER — LIDOCAINE-PRILOCAINE 2.5-2.5 % EX CREA
1.0000 "application " | TOPICAL_CREAM | CUTANEOUS | Status: DC | PRN
  Filled 2020-04-28: qty 5

## 2020-04-28 MED ORDER — DARBEPOETIN ALFA 40 MCG/0.4ML IJ SOSY
PREFILLED_SYRINGE | INTRAMUSCULAR | Status: AC
  Administered 2020-04-28: 40 ug via INTRAVENOUS
  Filled 2020-04-28: qty 0.4

## 2020-04-28 MED ORDER — CHLORHEXIDINE GLUCONATE CLOTH 2 % EX PADS
6.0000 | MEDICATED_PAD | Freq: Every day | CUTANEOUS | Status: DC
  Administered 2020-04-29 – 2020-04-30 (×2): 6 via TOPICAL

## 2020-04-28 MED ORDER — OXYCODONE HCL 5 MG PO TABS: 5.0000 mg | ORAL_TABLET | Freq: Four times a day (QID) | ORAL | Status: DC | PRN

## 2020-04-28 MED ORDER — PENTAFLUOROPROP-TETRAFLUOROETH EX AERO
1.0000 "application " | INHALATION_SPRAY | CUTANEOUS | Status: DC | PRN
  Filled 2020-04-28: qty 116

## 2020-04-28 MED ORDER — VANCOMYCIN HCL IN DEXTROSE 1-5 GM/200ML-% IV SOLN
1000.0000 mg | INTRAVENOUS | Status: AC
  Filled 2020-04-28 (×2): qty 200

## 2020-04-28 MED ORDER — INSULIN ASPART 100 UNIT/ML ~~LOC~~ SOLN
0.0000 [IU] | Freq: Three times a day (TID) | SUBCUTANEOUS | Status: DC
  Administered 2020-04-29 (×2): 5 [IU] via SUBCUTANEOUS
  Administered 2020-04-30: 3 [IU] via SUBCUTANEOUS
  Administered 2020-04-30: 2 [IU] via SUBCUTANEOUS
  Administered 2020-05-01: 1 [IU] via SUBCUTANEOUS
  Administered 2020-05-01: 2 [IU] via SUBCUTANEOUS
  Administered 2020-05-01: 1 [IU] via SUBCUTANEOUS
  Administered 2020-05-02: 3 [IU] via SUBCUTANEOUS

## 2020-04-28 MED ORDER — LIDOCAINE HCL (PF) 1 % IJ SOLN: 5.0000 mL | INTRAMUSCULAR | Status: DC | PRN

## 2020-04-28 MED ORDER — SODIUM CHLORIDE 0.9 % IV SOLN: 100.0000 mL | INTRAVENOUS | Status: DC | PRN

## 2020-04-28 MED ORDER — RENA-VITE PO TABS
1.0000 | ORAL_TABLET | Freq: Every day | ORAL | Status: DC
  Administered 2020-04-28 – 2020-05-05 (×8): 1 via ORAL
  Filled 2020-04-28 (×8): qty 1

## 2020-04-28 MED ORDER — DOXERCALCIFEROL 4 MCG/2ML IV SOLN
INTRAVENOUS | Status: AC
  Administered 2020-04-28: 6 ug via INTRAVENOUS
  Filled 2020-04-28: qty 4

## 2020-04-28 MED ORDER — HEPARIN SODIUM (PORCINE) 1000 UNIT/ML IJ SOLN
INTRAMUSCULAR | Status: AC
  Administered 2020-04-28: 1000 [IU] via INTRAVENOUS_CENTRAL
  Filled 2020-04-28: qty 5

## 2020-04-28 MED ORDER — ATORVASTATIN CALCIUM 40 MG PO TABS
40.0000 mg | ORAL_TABLET | Freq: Every day | ORAL | Status: DC
  Administered 2020-04-29 – 2020-05-05 (×7): 40 mg via ORAL
  Filled 2020-04-28 (×7): qty 1

## 2020-04-28 MED ORDER — SODIUM CHLORIDE 0.9 % IV BOLUS
250.0000 mL | Freq: Once | INTRAVENOUS | Status: AC
  Administered 2020-04-28: 250 mL via INTRAVENOUS

## 2020-04-28 MED ORDER — PIPERACILLIN-TAZOBACTAM IN DEX 2-0.25 GM/50ML IV SOLN
2.2500 g | Freq: Three times a day (TID) | INTRAVENOUS | Status: DC
  Administered 2020-04-28 – 2020-05-04 (×16): 2.25 g via INTRAVENOUS
  Filled 2020-04-28 (×20): qty 50

## 2020-04-28 MED ORDER — TOBRAMYCIN 0.3 % OP SOLN
1.0000 [drp] | Freq: Four times a day (QID) | OPHTHALMIC | Status: DC | PRN
  Filled 2020-04-28: qty 5

## 2020-04-28 MED ORDER — ALTEPLASE 2 MG IJ SOLR: 2.0000 mg | Freq: Once | INTRAMUSCULAR | Status: DC | PRN

## 2020-04-28 MED ORDER — DOXERCALCIFEROL 4 MCG/2ML IV SOLN
6.0000 ug | INTRAVENOUS | Status: DC
  Administered 2020-04-30 – 2020-05-05 (×2): 6 ug via INTRAVENOUS
  Filled 2020-04-28 (×3): qty 4

## 2020-04-28 MED ORDER — BENZONATATE 100 MG PO CAPS: 200.0000 mg | ORAL_CAPSULE | Freq: Three times a day (TID) | ORAL | Status: DC | PRN

## 2020-04-28 MED ORDER — VANCOMYCIN VARIABLE DOSE PER UNSTABLE RENAL FUNCTION (PHARMACIST DOSING): Status: DC

## 2020-04-28 MED ORDER — HEPARIN SODIUM (PORCINE) 5000 UNIT/ML IJ SOLN
5000.0000 [IU] | Freq: Three times a day (TID) | INTRAMUSCULAR | Status: DC
  Administered 2020-04-28 – 2020-04-29 (×2): 5000 [IU] via SUBCUTANEOUS
  Filled 2020-04-28 (×2): qty 1

## 2020-04-28 MED ORDER — DARBEPOETIN ALFA 40 MCG/0.4ML IJ SOSY: 40.0000 ug | PREFILLED_SYRINGE | INTRAMUSCULAR | Status: DC

## 2020-04-28 MED ORDER — HEPARIN SODIUM (PORCINE) 1000 UNIT/ML IJ SOLN
INTRAMUSCULAR | Status: AC
  Administered 2020-04-28: 10000 [IU] via INTRAVENOUS_CENTRAL
  Filled 2020-04-28: qty 10

## 2020-04-28 MED ORDER — UMECLIDINIUM BROMIDE 62.5 MCG/INH IN AEPB
1.0000 | INHALATION_SPRAY | Freq: Every day | RESPIRATORY_TRACT | Status: DC
  Administered 2020-05-06: 1 via RESPIRATORY_TRACT
  Filled 2020-04-28 (×2): qty 7

## 2020-04-28 MED ORDER — HEPARIN SODIUM (PORCINE) 1000 UNIT/ML DIALYSIS
10000.0000 [IU] | INTRAMUSCULAR | Status: DC | PRN
  Filled 2020-04-28 (×2): qty 10

## 2020-04-28 MED ORDER — POLYETHYLENE GLYCOL 3350 17 G PO PACK
17.0000 g | PACK | Freq: Every day | ORAL | Status: DC
  Administered 2020-04-29 – 2020-05-06 (×7): 17 g via ORAL
  Filled 2020-04-28 (×7): qty 1

## 2020-04-28 MED ORDER — CARVEDILOL 6.25 MG PO TABS
6.2500 mg | ORAL_TABLET | Freq: Two times a day (BID) | ORAL | Status: DC
  Administered 2020-04-29 – 2020-04-30 (×3): 6.25 mg via ORAL
  Filled 2020-04-28 (×3): qty 1

## 2020-04-28 MED ORDER — VANCOMYCIN HCL 10 G IV SOLR
2500.0000 mg | Freq: Once | INTRAVENOUS | Status: AC
  Administered 2020-04-28: 2500 mg via INTRAVENOUS
  Filled 2020-04-28: qty 2500

## 2020-04-28 NOTE — Consult Note (Addendum)
[facility] Renal Consultation Note    Indication for Consultation:  Management of ESRD/hemodialysis; anemia, hypertension/volume and secondary hyperparathyroidism  JAS:NKNLZJ, [place]  HPI: Jonathon Bailey is a 64 y.o. male. ESRD on HD TTS at [facility].  Past medical history significant for Hx CVA, CAD s/p CABG, diastolic HF, DM, COPD, and OSA.    Patient presented to the ED for evaluation of pain in his ankle and peeling skin on his toes.  Reports he has been followed by the [facility] for his foot for "a while" and "they are not taking care of it."  He was seen by them couple weeks ago after the pain started in his L ankle.  A few days ago he rubbed the "dead skin" off and it has become more irritated looking since that time.  He has been using antibiotic ointment, iodine and soaking in epsom salt.  Denies pain in his foot, just in his ankle.  Appetite is good.  Denies fever, chills, SOB, CP, n/v/d, abdominal pain, weakness, dizziness and fatigue.  Denies any previous vascular procedure but reports they did "take a vein from that leg" for his CABG.  Of note patient is compliant with prescribed dialysis regimen.  Last dialysis on 04/26/20, tolerated well using TDC and he left at his dry weight.  Patient reports he has a failed AVF and has refused further permanent access placement.   Pertinent findings in the ED include necrotic appearing L 2nd toe with sloughing skin and erythema extending to the ankle, WBC 11.4, Hgb 8.4, and XR 2nd toe with no indication of osteomyelitis.  Patient has been admitted for further evaluation and management.   Past Medical History:  Diagnosis Date  . Anxiety   . Arthritis   . Asthma    "as a child"  . CAD (coronary artery disease)   . Chronic systolic heart failure ([place]) 09/21/2008   ECHO Feb 2013 showed LVEF low normal at 50-55%, +hypokinetic anterolateral wall and inferolateral wall.    . CKD (chronic kidney disease) stage 4, GFR 15-29 ml/min  (HCC) 08/30/2009   Progressive renal failure since 2008, creatinine 1.2 in 2008 up to 3.5 in 2012 and 3.2-5.0 in 2013. All UA's 2011-13 showed >300 protein on dipstick. Work-up in May 2011 showed negative Urine IFE and SPEP, ultrasound showed 12-13 cm kidneys with increased echogenicity and UPC ratio was 1.5 gm proteinuria.  Hgb A1C's from 2011 to 2013 were all between 9-11.  Patient saw Dr. Donnetta Hutching (vasc surgery) for HD access in Aug 2013 > vein mapping was done and Dr. Donnetta Hutching felt the left arm (pt is R handed) was suitable for L arm Cimino radiocephalic fistula. Patient said he wasn't ready to consider doing dialysis and declined the surgery.     . Depression   . Diabetic retinopathy ([place])   . ESRD (end stage renal disease) on dialysis ([place])   . QBHALPFX(902.4)    "maybe monthly" (03/15/2014)  . Hyperlipidemia   . Hypertension   . Macular edema   . Myocardial infarction [facility])    status post MI x2 and 3 stents placed in 2003  . Obesity   . Sleep apnea    does not use CPAP  . Stroke [facility]) ~ 2007; ~1987   "weak on right side; messed w/right side of brain; cry all the time"  . Type II diabetes mellitus (HCC)    insulin dependent   Past Surgical History:  Procedure Laterality Date  . AV FISTULA PLACEMENT  10/17/2012   Procedure: ARTERIOVENOUS (AV) FISTULA CREATION;  Surgeon: Mal Misty, MD;  Location: [place];  Service: Vascular;  Laterality: Left;  . BONE EXOSTOSIS EXCISION Right 06/04/2019   Procedure: EXOSTOSIS EXCISION;  Surgeon: Leanora Cover, MD;  Location: [place];  Service: Orthopedics;  Laterality: Right;  . CAPD INSERTION N/A 03/15/2014   Procedure: LAPAROSCOPIC INSERTION CONTINUOUS AMBULATORY PERITONEAL DIALYSIS CATHETER, LAPARASCOPIC INCISIONAL HERNIA  REPAIR  WITH MESH, OMENTOPEXY AND LYSIS OF ADHESIONS;  Surgeon: [name], MD;  Location: [place];  Service: General;  Laterality: N/A;  . CORONARY ANGIOPLASTY WITH STENT PLACEMENT  ~ 2002   "3"  . CORONARY  ANGIOPLASTY WITH STENT PLACEMENT  01/24/2004   successful PCI/stenting RCA  drug eluting cypher stent  . CORONARY ANGIOPLASTY WITH STENT PLACEMENT  01/28/2004   successful stentin of a large bifurcation marginal branch of the ramus intermediate vessel  . FLEXIBLE SIGMOIDOSCOPY N/A 09/05/2014   Procedure: FLEXIBLE SIGMOIDOSCOPY;  Surgeon: Cleotis Nipper, MD;  Location[facility] ENDOSCOPY;  Service: Endoscopy;  Laterality: N/A;  . HERNIA REPAIR    . INCISION AND DRAINAGE ABSCESS N/A 09/06/2014   Procedure: REMOVAL OF PD CATH;  Surgeon: Coralie Keens, MD;  Location: [place];  Service: General;  Laterality: N/A;  . INCISIONAL HERNIA REPAIR  03/15/2014  . LAPAROSCOPIC LYSIS OF ADHESIONS  03/15/2014  . NM MYOCAR PERF WALL MOTION  02/06/2012   normal perfusion scan  . PERITONEAL CATHETER INSERTION  03/15/2014  . REFRACTIVE SURGERY Bilateral   . SHUNTOGRAM N/A 03/11/2013   Procedure: Fistulogram;  Surgeon: Conrad Roscommon, MD;  Location: [facility] CATH LAB;  Service: Cardiovascular;  Laterality: N/A;  . TENDON TRANSFER Right 06/04/2019   Procedure: RIGHT HAND EXTENSOR TENDON TRANSFER TO SMALL FINGER;  Surgeon: Leanora Cover, MD;  Location: [place];  Service: Orthopedics;  Laterality: Right;  . UMBILICAL HERNIA REPAIR     Family History  Problem Relation Age of Onset  . Asthma Mother   . Hyperlipidemia Mother   . Hypertension Mother   . Stroke Father   . Heart attack Father   . Prostate cancer Father   . Deep vein thrombosis Father   . Cancer Father   . Diabetes Father   . Hyperlipidemia Father   . Hypertension Father   . Other Father        varicose veins  . Heart disease Father        before age 44  . Other Sister        varicose veins   Social History:  reports that he quit smoking about 33 years ago. His smoking use included cigarettes. He has a 15.00 pack-year smoking history. He has never used smokeless tobacco. He reports previous alcohol use. He reports that he does not use  drugs. Allergies  Allergen Reactions  . Feraheme [Ferumoxytol] Itching    Severe itching.   Prior to Admission medications   Medication Sig Start Date End Date Taking? Authorizing Provider  albuterol (PROVENTIL HFA;VENTOLIN HFA) 108 (90 BASE) MCG/ACT inhaler Inhale 2 puffs into the lungs every 4 (four) hours as needed for wheezing. For wheezing    [provider]  aspirin 325 MG tablet Take 325 mg by mouth daily.    [provider]  atorvastatin (LIPITOR) 40 MG tablet Take 40 mg by mouth at bedtime.    [provider]  B Complex-C-Biotin-E-Min-FA (DIALYVITE 5000 PO) Take by mouth.    [provider]  benzonatate Lavella Lemons)  100 MG capsule Take by mouth 3 (three) times daily as needed for cough.    [provider]  carvedilol (COREG) 25 MG tablet Take 37.5 mg by mouth 2 (two) times daily with a meal.    [provider]  cinacalcet (SENSIPAR) 60 MG tablet Take 60 mg by mouth daily.    [provider]  fluticasone (FLONASE) 50 MCG/ACT nasal spray Place 2 sprays into both nostrils daily.    [provider]  hydrALAZINE (APRESOLINE) 50 MG tablet Take 50 mg by mouth 3 (three) times daily.    [provider]  insulin detemir (LEVEMIR) 100 UNIT/ML injection Inject 75 Units into the skin 2 (two) times daily.    [provider]  insulin lispro (HUMALOG) 100 UNIT/ML injection Inject into the skin 3 (three) times daily with meals.    [provider]  oxyCODONE (ROXICODONE) 5 MG immediate release tablet 1-2 tabs PO q6 hours prn pain 06/04/19   Leanora Cover, MD  polyethylene glycol (MIRALAX / GLYCOLAX) packet Take 17 g by mouth daily. 09/08/14   Janora Norlander, DO  tiotropium (SPIRIVA) 18 MCG inhalation capsule Place 18 mcg into inhaler and inhale daily as needed (for shortness of breath).     [provider]  tobramycin (TOBREX) 0.3 % ophthalmic solution Place 1 drop into both eyes every 6 (six)  hours as needed (for eye irritation).     [provider]   Current Facility-Administered Medications  Medication Dose Route Frequency Provider Last Rate Last Admin  . 0.9 %  sodium chloride infusion  100 mL Intravenous PRN [facility], Ria Comment, PA      . 0.9 %  sodium chloride infusion  100 mL Intravenous PRN Vicente Weidler, Ria Comment, PA      . acetaminophen (TYLENOL) tablet 650 mg  650 mg Oral Q6H PRN Simmons-Robinson, Makiera, MD       Or  . acetaminophen (TYLENOL) suppository 650 mg  650 mg Rectal Q6H PRN Simmons-Robinson, Makiera, MD      . alteplase (CATHFLO ACTIVASE) injection 2 mg  2 mg Intracatheter Once PRN Paige Monarrez, Ria Comment, PA      . atorvastatin (LIPITOR) tablet 40 mg  40 mg Oral QHS Simmons-Robinson, Makiera, MD      . benzonatate (TESSALON) capsule 200 mg  200 mg Oral TID PRN Simmons-Robinson, Makiera, MD      . Derrill Memo ON 04/29/2020] Chlorhexidine Gluconate Cloth 2 % PADS 6 each  6 each Topical Q0600 Griff Badley, PA      . heparin injection 1,000 Units  1,000 Units Dialysis PRN Yareth Macdonnell, Ria Comment, PA      . Derrill Memo ON 04/29/2020] heparin injection 10,000 Units  10,000 Units Dialysis PRN Bailie Christenbury, Ria Comment, PA      . heparin injection 5,000 Units  5,000 Units Subcutaneous Q8H Simmons-Robinson, Makiera, MD      . lidocaine (PF) (XYLOCAINE) 1 % injection 5 mL  5 mL Intradermal PRN Ory Elting, Ria Comment, PA      . lidocaine-prilocaine (EMLA) cream 1 application  1 application Topical PRN Berlinda Farve, PA      . oxyCODONE (Oxy IR/ROXICODONE) immediate release tablet 5 mg  5 mg Oral Q6H PRN Simmons-Robinson, Makiera, MD      . pentafluoroprop-tetrafluoroeth (GEBAUERS) aerosol 1 application  1 application Topical PRN Plez Belton, Ria Comment, PA      . piperacillin-tazobactam (ZOSYN) IVPB 3.375 g  3.375 g Intravenous Once Simmons-Robinson, Makiera, MD      . polyethylene glycol (MIRALAX / GLYCOLAX) packet 17 g  17 g Oral Daily Simmons-Robinson, Makiera, MD      . sodium chloride 0.9 %  bolus 250 mL  250 mL Intravenous Once Simmons-Robinson, Makiera, MD      . tiotropium (SPIRIVA) inhalation capsule ([facility] use ONLY) 18 mcg  18 mcg Inhalation Daily PRN Simmons-Robinson, Makiera, MD      . tobramycin (TOBREX) 0.3 % ophthalmic solution 1 drop  1 drop Both Eyes Q6H PRN Simmons-Robinson, Makiera, MD      . vancomycin (VANCOCIN) 2,500 mg in sodium chloride 0.9 % 500 mL IVPB  2,500 mg Intravenous Once Bertis Ruddy, RPH      . vancomycin variable dose per unstable renal function (pharmacist dosing)   Does not apply See admin instructions Bertis Ruddy, [facility]       Current Outpatient Medications  Medication Sig Dispense Refill  . albuterol (PROVENTIL HFA;VENTOLIN HFA) 108 (90 BASE) MCG/ACT inhaler Inhale 2 puffs into the lungs every 4 (four) hours as needed for wheezing. For wheezing    . aspirin 325 MG tablet Take 325 mg by mouth daily.    Marland Kitchen atorvastatin (LIPITOR) 40 MG tablet Take 40 mg by mouth at bedtime.    . B Complex-C-Biotin-E-Min-FA (DIALYVITE 5000 PO) Take by mouth.    . benzonatate (TESSALON) 100 MG capsule Take by mouth 3 (three) times daily as needed for cough.    . carvedilol (COREG) 25 MG tablet Take 37.5 mg by mouth 2 (two) times daily with a meal.    . cinacalcet (SENSIPAR) 60 MG tablet Take 60 mg by mouth daily.    . fluticasone (FLONASE) 50 MCG/ACT nasal spray Place 2 sprays into both nostrils daily.    . hydrALAZINE (APRESOLINE) 50 MG tablet Take 50 mg by mouth 3 (three) times daily.    . insulin detemir (LEVEMIR) 100 UNIT/ML injection Inject 75 Units into the skin 2 (two) times daily.    . insulin lispro (HUMALOG) 100 UNIT/ML injection Inject into the skin 3 (three) times daily with meals.    Marland Kitchen oxyCODONE (ROXICODONE) 5 MG immediate release tablet 1-2 tabs PO q6 hours prn pain 20 tablet 0  . polyethylene glycol (MIRALAX / GLYCOLAX) packet Take 17 g by mouth daily. 14 each 0  . tiotropium (SPIRIVA) 18 MCG inhalation capsule Place 18 mcg into inhaler and inhale daily  as needed (for shortness of breath).     . tobramycin (TOBREX) 0.3 % ophthalmic solution Place 1 drop into both eyes every 6 (six) hours as needed (for eye irritation).      Labs: Basic Metabolic Panel: Recent Labs  Lab 04/27/20 2137  NA 133*  K 4.6  CL 91*  CO2 25  GLUCOSE 148*  BUN 48*  CREATININE 10.73*  CALCIUM 9.2   CBC: Recent Labs  Lab 04/27/20 2137  WBC 11.4*  HGB 8.4*  HCT 28.4*  MCV 89.6  PLT 366   Studies/Results: DG Toe 2nd Left  Result Date: 04/27/2020 CLINICAL DATA:  Pain EXAM: LEFT SECOND TOE COMPARISON:  None. FINDINGS: There is no evidence of fracture or dislocation. There is no evidence of arthropathy or other focal bone abnormality. Soft tissues are unremarkable. Vascular calcifications are noted. IMPRESSION: Negative. Electronically Signed   By: Constance Holster M.D.   On: 04/27/2020 22:05    ROS: All others negative except those listed in HPI.  Physical Exam: Vitals:   04/28/20 0652 04/28/20 0959 04/28/20 1217 04/28/20 1353  BP: 117/65 120/66 93/65 107/79  Pulse: 79 78 85 79  Resp: 20  14 (!) 23 16  Temp:  99.1 F (37.3 C)    TempSrc:      SpO2: 100% 96% 96% 98%     General: WDWN male in NAD Head: NCAT sclera not icteric MMM Neck: Supple. No lymphadenopathy Lungs: CTA bilaterally. No wheeze, rales or rhonchi. Breathing is unlabored. Heart: RRR. No murmur, rubs or gallops.  Abdomen: soft, nontender, +BS, no guarding, no rebound tenderness Lower extremities: trace to 1+ edema in b/l LE.  L foot cold, skin sloughing, necrotic 2nd toe with darkened skin on other toes/forefoot/ankle and erythema extending up from toes to ankle Neuro: AAOx3. Moves all extremities spontaneously. Psych:  Responds to questions appropriately with a normal affect. Dialysis Access: [facility]  Dialysis Orders:  [place]S - [place]  4.5hrs, BFR 400, DFR 800,  EDW 126kg, 2K/ 2.25Ca  Access: TDC  Heparin 12000 Mircera 30 mcg q2wks - just restarted, last 10/20/19 Hectorol 6 mcg IV  qHD    Assessment/Plan: 1.  ischemic L foot/cellulitis -  Xray shows no signs of osteomyelitis.  VVS consulting, plan for aortogram, arteriogram and possible endovascular intervention tomorrow.  BC ordered.  ABX started.  Per primary/VVS 2.  ESRD -  On HD TTS.  Orders written for HD today per regular schedule 3.  Hypertension/volume  - BP well controlled.  Does not appear grossly volume overloaded on exam, plan for UF to dry weight.   4.  Anemia of CKD - Hgb 8.4.  Give ESA with HD today.  No IV iron due to allergy.  5.  Secondary Hyperparathyroidism -  Ca at goal. Will check phos. Continue VDRA, sensipar and binders.  6.  Nutrition - Renal diet w/fluid restrictions.  7. DMT2 - per primary 8. CAD s/p CABG - per primary 9. Hx CVA 10. COPD 53. OSA - on CPAP  Jen Mow, PA-C [employer] 04/28/2020, 2:47 PM

## 2020-04-28 NOTE — Progress Notes (Addendum)
Pharmacy Antibiotic Note  Jonathon Bailey is a 64 y.o. male admitted on 04/27/2020 with DFI.  Pharmacy has been consulted for Vancomycin dosing.  ESRD on HD- usually TTS.  Plan: Vancomycin 2500 mg IV x 1, then 1000 mg IV qHD Monitor HD plans, clinical progression and VVS recs Vancomycin random level as needed     Temp (24hrs), Avg:99.9 F (37.7 C), Min:99.1 F (37.3 C), Max:101 F (38.3 C)  Recent Labs  Lab 04/27/20 2137  WBC 11.4*  CREATININE 10.73*    CrCl cannot be calculated (Unknown ideal weight.).    Allergies  Allergen Reactions   Feraheme [Ferumoxytol] Itching    Severe itching.    Bertis Ruddy, PharmD Clinical Pharmacist ED Pharmacist Phone # [phone] 04/28/2020 2:07 PM

## 2020-04-28 NOTE — ED Provider Notes (Addendum)
[facility] EMERGENCY DEPARTMENT Provider Note   CSN: [id number] Arrival date & time: 04/27/20  2107     History Chief Complaint  Patient presents with  . Toe Pain    Jonathon Bailey is a 64 y.o. male.  HPI 64 year old male with a history of CAD, CHF with an unknown EF, DM type II, MI,ESRD,  obesity, sleep apnea, however pretension, hyperlipidemia presents to the ER for concerns over his left second toe.  Patient was seen in urgent care yesterday, states that he has had an infection in his left toe which has been followed by the [facility] over the last month.  He states that the [place] "was not doing what it supposed to be doing" has not been giving him any antibiotics.  He reports worsening infection to the second toe.  He came to the ER 2 days ago, but due to the long wait times, decided to go to urgent care.  Was seen in urgent care, and told to come to the ER.  States he has some diminished feeling in the left foot secondary to his diabetes.  States that he his foot feels cold when he touches it.  He reports pulling off dead skin several days ago.  Denies any fevers or chills.  No weakness, chest pain, shortness of breath, domino pain, headache.    Past Medical History:  Diagnosis Date  . Anxiety   . Arthritis   . Asthma    "as a child"  . CAD (coronary artery disease)   . Chronic systolic heart failure ([place]) 09/21/2008   ECHO Feb 2013 showed LVEF low normal at 50-55%, +hypokinetic anterolateral wall and inferolateral wall.    . CKD (chronic kidney disease) stage 4, GFR 15-29 ml/min (HCC) 08/30/2009   Progressive renal failure since 2008, creatinine 1.2 in 2008 up to 3.5 in 2012 and 3.2-5.0 in 2013. All UA's 2011-13 showed >300 protein on dipstick. Work-up in May 2011 showed negative Urine IFE and SPEP, ultrasound showed 12-13 cm kidneys with increased echogenicity and UPC ratio was 1.5 gm proteinuria.  Hgb A1C's from 2011 to 2013 were all between 9-11.  Patient saw Dr.  Donnetta Hutching (vasc surgery) for HD access in Aug 2013 > vein mapping was done and Dr. Donnetta Hutching felt the left arm (pt is R handed) was suitable for L arm C[place] radiocephalic fistula. Patient said he wasn't ready to consider doing dialysis and declined the surgery.     . Depression   . Diabetic retinopathy ([place])   . ESRD (end stage renal disease) on dialysis ([place])   . XHBZJIRC(789.3)    "maybe monthly" (03/15/2014)  . Hyperlipidemia   . Hypertension   . Macular edema   . Myocardial infarction [facility])    status post MI x2 and 3 stents placed in 2003  . Obesity   . Sleep apnea    does not use CPAP  . Stroke [facility] ~ 2007; ~1987   "weak on right side; messed w/right side of brain; cry all the time"  . Type II diabetes mellitus (HCC)    insulin dependent    Patient Active Problem List   Diagnosis Date Noted  . Diabetic retinopathy ([place]) 08/21/2016  . Other specified fever   . Generalized abdominal pain   . Acute lower GI bleeding   . ESRD (end stage renal disease) ([place])   . Bacterial peritonitis ([place])   . Blood poisoning   . ESRD on peritoneal dialysis ([place])   . Acute  blood loss anemia   . Abdominal pain 09/01/2014  . CAPD (continuous ambulatory peritoneal dialysis) status 03/15/2014  . Morbid obesity ([place]) 02/01/2014  . 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 Preventative health care 12/02/2013  . Other complications due to renal dialysis device, implant, and graft 10/24/2012  . COPD (chronic obstructive pulmonary disease) ([place]) 10/23/2012  . DIABETIC PERIPHERAL NEUROPATHY 01/09/2010  . ESRD (end stage renal disease) on dialysis ([place]) 08/30/2009  . CEREBROVASCULAR ACCIDENT, HX OF 08/30/2009  . Diabetes mellitus type 2 with complications ([place]) 02/54/2706  . HYPERLIPIDEMIA 09/21/2008  . OBSTRUCTIVE SLEEP APNEA 09/21/2008  . ESSENTIAL HYPERTENSION 09/21/2008  . CORONARY ARTERY DISEASE 09/21/2008  . Chronic combined systolic and  diastolic heart failure ([place]) 09/21/2008    Past Surgical History:  Procedure Laterality Date  . AV FISTULA PLACEMENT  10/17/2012   Procedure: ARTERIOVENOUS (AV) FISTULA CREATION;  Surgeon: Mal Misty, MD;  Location: [place];  Service: Vascular;  Laterality: Left;  . BONE EXOSTOSIS EXCISION Right 06/04/2019   Procedure: EXOSTOSIS EXCISION;  Surgeon: Leanora Cover, MD;  Location: [place];  Service: Orthopedics;  Laterality: Right;  . CAPD INSERTION N/A 03/15/2014   Procedure: LAPAROSCOPIC INSERTION CONTINUOUS AMBULATORY PERITONEAL DIALYSIS CATHETER, LAPARASCOPIC INCISIONAL HERNIA  REPAIR  WITH MESH, OMENTOPEXY AND LYSIS OF ADHESIONS;  Surgeon: [name], MD;  Location: [place];  Service: General;  Laterality: N/A;  . CORONARY ANGIOPLASTY WITH STENT PLACEMENT  ~ 2002   "3"  . CORONARY ANGIOPLASTY WITH STENT PLACEMENT  01/24/2004   successful PCI/stenting RCA  drug eluting cypher stent  . CORONARY ANGIOPLASTY WITH STENT PLACEMENT  01/28/2004   successful stentin of a large bifurcation marginal branch of the ramus intermediate vessel  . FLEXIBLE SIGMOIDOSCOPY N/A 09/05/2014   Procedure: FLEXIBLE SIGMOIDOSCOPY;  Surgeon: Cleotis Nipper, MD;  Location[facility] ENDOSCOPY;  Service: Endoscopy;  Laterality: N/A;  . HERNIA REPAIR    . INCISION AND DRAINAGE ABSCESS N/A 09/06/2014   Procedure: REMOVAL OF PD CATH;  Surgeon: Coralie Keens, MD;  Location: [place];  Service: General;  Laterality: N/A;  . INCISIONAL HERNIA REPAIR  03/15/2014  . LAPAROSCOPIC LYSIS OF ADHESIONS  03/15/2014  . NM MYOCAR PERF WALL MOTION  02/06/2012   normal perfusion scan  . PERITONEAL CATHETER INSERTION  03/15/2014  . REFRACTIVE SURGERY Bilateral   . SHUNTOGRAM N/A 03/11/2013   Procedure: Fistulogram;  Surgeon: Conrad Antimony, MD;  Location: [facility] CATH LAB;  Service: Cardiovascular;  Laterality: N/A;  . TENDON TRANSFER Right 06/04/2019   Procedure: RIGHT HAND EXTENSOR TENDON TRANSFER TO SMALL FINGER;  Surgeon:  Leanora Cover, MD;  Location: [place];  Service: Orthopedics;  Laterality: Right;  . UMBILICAL HERNIA REPAIR         Family History  Problem Relation Age of Onset  . Asthma Mother   . Hyperlipidemia Mother   . Hypertension Mother   . Stroke Father   . Heart attack Father   . Prostate cancer Father   . Deep vein thrombosis Father   . Cancer Father   . Diabetes Father   . Hyperlipidemia Father   . Hypertension Father   . Other Father        varicose veins  . Heart disease Father        before age 43  . Other Sister        varicose veins    Social History   Tobacco Use  .  Smoking status: Former Smoker    Packs/day: 1.00    Years: 15.00    Pack years: 15.00    Types: Cigarettes    Quit date: 09/24/1986    Years since quitting: 33.6  . Smokeless tobacco: Never Used  Substance Use Topics  . Alcohol use: Not Currently    Comment: stopped in 2013  . Drug use: No    Home Medications Prior to Admission medications   Medication Sig Start Date End Date Taking? Authorizing Provider  albuterol (PROVENTIL HFA;VENTOLIN HFA) 108 (90 BASE) MCG/ACT inhaler Inhale 2 puffs into the lungs every 4 (four) hours as needed for wheezing. For wheezing    [provider]  aspirin 325 MG tablet Take 325 mg by mouth daily.    [provider]  atorvastatin (LIPITOR) 40 MG tablet Take 40 mg by mouth at bedtime.    [provider]  B Complex-C-Biotin-E-Min-FA (DIALYVITE 5000 PO) Take by mouth.    [provider]  benzonatate (TESSALON) 100 MG capsule Take by mouth 3 (three) times daily as needed for cough.    [provider]  carvedilol (COREG) 25 MG tablet Take 37.5 mg by mouth 2 (two) times daily with a meal.    [provider]  cinacalcet (SENSIPAR) 60 MG tablet Take 60 mg by mouth daily.    [provider]  fluticasone (FLONASE) 50 MCG/ACT nasal spray Place 2 sprays into both nostrils daily.    [provider]  hydrALAZINE (APRESOLINE) 50 MG tablet Take 50 mg by mouth 3 (three) times daily.    [provider]  insulin detemir (LEVEMIR) 100 UNIT/ML injection Inject 75 Units into the skin 2 (two) times daily.    [provider]  insulin lispro (HUMALOG) 100 UNIT/ML injection Inject into the skin 3 (three) times daily with meals.    [provider]  oxyCODONE (ROXICODONE) 5 MG immediate release tablet 1-2 tabs PO q6 hours prn pain 06/04/19   Leanora Cover, MD  polyethylene glycol (MIRALAX / GLYCOLAX) packet Take 17 g by mouth daily. 09/08/14   Janora Norlander, DO  tiotropium (SPIRIVA) 18 MCG inhalation capsule Place 18 mcg into inhaler and inhale daily as needed (for shortness of breath).     [provider]  tobramycin (TOBREX) 0.3 % ophthalmic solution Place 1 drop into both eyes every 6 (six) hours as needed (for eye irritation).     [provider]    Allergies    Feraheme [ferumoxytol]  Review of Systems   Review of Systems  Constitutional: Negative for chills and fever.  HENT: Negative for ear pain and sore throat.   Eyes: Negative for pain and visual disturbance.  Respiratory: Negative for cough and shortness of breath.   Cardiovascular: Negative for chest pain and palpitations.  Gastrointestinal: Negative for abdominal pain and vomiting.  Genitourinary: Negative for dysuria and hematuria.  Musculoskeletal: Positive for arthralgias, gait problem and joint swelling. Negative for back pain, neck pain and neck stiffness.  Skin: Positive for color change. Negative for rash.  Neurological: Negative for seizures and syncope.  All other systems reviewed and are negative.   Physical Exam Updated Vital Signs BP 93/65   Pulse 85   Temp 99.1 F (37.3 C)   Resp (!) 23   SpO2 96%   Physical Exam Vitals and nursing note reviewed.  Constitutional:      Appearance: He is well-developed. He is obese. He is ill-appearing (chronically ill  appearing). He is  not diaphoretic.  HENT:     Head: Normocephalic and atraumatic.     Mouth/Throat:     Mouth: Mucous membranes are moist.     Pharynx: Oropharynx is clear.  Eyes:     Conjunctiva/sclera: Conjunctivae normal.     Pupils: Pupils are equal, round, and reactive to light.  Cardiovascular:     Rate and Rhythm: Normal rate and regular rhythm.     Pulses: Normal pulses.     Heart sounds: Normal heart sounds. No murmur heard.   Pulmonary:     Effort: Pulmonary effort is normal. No respiratory distress.     Breath sounds: Normal breath sounds.  Abdominal:     Palpations: Abdomen is soft.     Tenderness: There is no abdominal tenderness.  Musculoskeletal:        General: No swelling. Normal range of motion.     Cervical back: Normal range of motion and neck supple.     Right lower leg: No edema.     Left lower leg: Edema present.     Comments: Unable to locate left DP pulses with Doppler.  Popliteal pulses palpable with Doppler.  Left lower extremity cold.  Visible skin sloughing, multiple discolored toes.  Left toe with evidence of necrosis and small ulceration.  Full range of motion of ankle including dorsiflexion plantarflexion.  Left lower extremity with decreased sensations. No warmth to the ankle. >3 cap refill   Skin:    General: Skin is warm and dry.     Capillary Refill: Capillary refill takes more than 3 seconds.     Findings: Erythema and lesion present. No bruising.  Neurological:     General: No focal deficit present.     Mental Status: He is alert and oriented to person, place, and time.     Sensory: Sensory deficit (to the left  lower foot ) present.     Motor: No weakness.  Psychiatric:        Mood and Affect: Mood normal.        Behavior: Behavior normal.           ED Results / Procedures / Treatments   Labs (all labs ordered are listed, but only abnormal results are displayed) Labs Reviewed  CBC - Abnormal; Notable for the following  components:      Result Value   WBC 11.4 (*)    RBC 3.17 (*)    Hemoglobin 8.4 (*)    HCT 28.4 (*)    MCHC 29.6 (*)    RDW 17.0 (*)    All other components within normal limits  BASIC METABOLIC PANEL - Abnormal; Notable for the following components:   Sodium 133 (*)    Chloride 91 (*)    Glucose, Bld 148 (*)    BUN 48 (*)    Creatinine, Ser 10.73 (*)    GFR calc non Af Amer 5 (*)    GFR calc Af Amer 5 (*)    Anion gap 17 (*)    All other components within normal limits  SARS CORONAVIRUS 2 BY RT PCR (HOSPITAL ORDER, [place] LAB)  CULTURE, BLOOD (ROUTINE X 2)  CULTURE, BLOOD (ROUTINE X 2)  LACTIC ACID, PLASMA  LACTIC ACID, PLASMA    EKG None  Radiology DG Toe 2nd Left  Result Date: 04/27/2020 CLINICAL DATA:  Pain EXAM: LEFT SECOND TOE COMPARISON:  None. FINDINGS: There is no evidence of fracture or dislocation. There is no evidence of  arthropathy or other focal bone abnormality. Soft tissues are unremarkable. Vascular calcifications are noted. IMPRESSION: Negative. Electronically Signed   By: Constance Holster M.D.   On: 04/27/2020 22:05    Procedures Procedures (including critical care time)  Medications Ordered in ED Medications  piperacillin-tazobactam (ZOSYN) IVPB 3.375 g (has no administration in time range)  sodium chloride 0.9 % bolus 250 mL (has no administration in time range)    ED Course  I have reviewed the triage vital signs and the nursing notes.  Pertinent labs & imaging results that were available during my care of the patient were reviewed by me and considered in my medical decision making (see chart for details).    MDM Rules/Calculators/A&P                         64 year old male with discoloration and pain to the second left toe. On presentation, the patient is chronically ill-appearing, however no acute distress, nontoxic-appearing, in no acute distress, resting comfortably in the ER bed.  Vital signs show initial  presentation of a temperature of 101.  Pulse was 90, respirations 20.  Blood pressure was 121/69.  Fever improved without treatment, currently 99.1.  Blood pressure of 93/65, pulse of 85.  No evidence of sepsis at this time.  Physical exam with necrotic portion of the second left toe.  There appears to be sloughing of the skin on the ventral aspect of his foot.  Was not able to palpate a pulse via Doppler, extremities cold with cap refill of more than 3 seconds.  Popliteal pulse intact.  BMP with mild hyponatremia, creatinine and BUN consistent in the setting of ESRD.  Slightly elevated anion gap of 17.  CBC with mild leukocytosis of 11.4, hemoglobin of 8.4 which appears stable.  Plain films of the left toe without sign of osteomyelitis or soft tissue swelling.  However, there is concern for left lower extremity ischemia.  Given that the patient presented febrile, with a slight white count, elevated anion gap, and most recent blood pressure soft at 93/65, sepsis is of concern given gangrenous.  Of the second left toe.  Discussed with Dr. Vallery Ridge, will give Zosyn and Vanco per diabetic foot ulcer antibiotic regimen.  We will renally dose per pharmacy. Patient also given 250 cc of fluids, given he has a history of CHF and is on dialysis.  Consulted Dr. Donnetta Hutching w/ vascular, he will come and evaluate the patient; pt will need admission to medicine as his limb is likely ischemic. Will order ABI per their request. Consulted family medicine Dr. Ouida Sills who will admit the patient for further evaluation and treatment.  At this stage in the ED course, the patient has remained hemodynamically stable.  Patient was seen and evaluated by Dr. Vallery Ridge who is agreeable to the above plan and disposition. Final Clinical Impression(s) / ED Diagnoses Final diagnoses:  Ischemia of foot    Rx / DC Orders ED Discharge Orders    None             Lyndel Safe 04/28/20 1359    Charlesetta Shanks,  MD 04/29/20 1523

## 2020-04-28 NOTE — Progress Notes (Signed)
FPTS Interim Progress Note  S: Patient seen at bedside in ED, just returned from dialysis. Asking for food. No concerns at this time.  O: BP 137/78 (BP Location: Right Arm)   Pulse 86   Temp 98.2 F (36.8 C) (Oral)   Resp 15 Comment: Simultaneous filing. User may not have seen previous data.  SpO2 99%   Laying in bed comfortably, NAD Left foot with no significant change from previous, see vascular note for image  A/P: - renal diet, then NPO at midnight  Zola Button, MD 04/28/2020, 9:38 PM PGY-1, [place] Medicine Service pager [phone]

## 2020-04-28 NOTE — ED Notes (Signed)
Admitting at bedside-Monique,RN  

## 2020-04-28 NOTE — ED Notes (Signed)
RN attempted to call report; will call back-Monique,RN

## 2020-04-28 NOTE — H&P (View-Only) (Signed)
Hospital Consult    Reason for Consult: cold left foot, black toes Requesting Physician:  MRN #:  [id number]  History of Present Illness: This is a 64 y.o. male with medical history significant for CAD s/p CABG, ESRD on HD via TDC, CHF, hyperlipidemia, hypertension and DM who was seen at Urgent care yesterday and was sent to ED due to concerns about left foot ulcerations and coldness. Patient states that approximately 4 weeks ago he noticed some skin coming off of his left 2nd toe and he pulled off the skin. He had been doing Epson salt soaks and applying witch hazel and trying to keep area clean however it continued to worsen. He subsequently went to [place] for evaluation and states that he was told to paint it with iodine and keep foot protected. He says they told him he needed antibiotics but he never received them. He went to the [place] on two other occasions over past several weeks and says he never received any antibiotics even though he was told that he should. On Monday 8/2 he says that he showed it to one of the RNs at dialysis who told him to apply iodine and antibiotic gel. He also showed his foot to his son who encouraged him to go get it evaluated so he finally presented to ER however due to long wait time he decided to go to Urgent care yesterday. Urgent care advised him to go to the ER. He does report that he has pain in left leg all the way from foot to left hip, but this has been present for many years since he was in the service. He does describe claudication in the left leg- explaining that his pain occurs on ambulation and is improved after several minutes of rest. He has no rest pain. He states that he has full feeling in his left foot and toes and is able to move his toes but they just " feet swollen". His left leg stays swollen compared to the right leg since his CABG. He overall is very active. States he lives alone so he has to do all his ADL's and gets around without difficulty despite  chronic left leg pain. He denies any fever or chills, nausea, vomiting, chest pain or shortness of breath. He is former smoker who quit in 1988.  Vascular surgery has been consulted too see the patient due to concerns about his left foot ulcerations and coldness  Past Medical History:  Diagnosis Date  . Anxiety   . Arthritis   . Asthma    "as a child"  . CAD (coronary artery disease)   . Chronic systolic heart failure ([place]) 09/21/2008   ECHO Feb 2013 showed LVEF low normal at 50-55%, +hypokinetic anterolateral wall and inferolateral wall.    . CKD (chronic kidney disease) stage 4, GFR 15-29 ml/min (HCC) 08/30/2009   Progressive renal failure since 2008, creatinine 1.2 in 2008 up to 3.5 in 2012 and 3.2-5.0 in 2013. All UA's 2011-13 showed >300 protein on dipstick. Work-up in May 2011 showed negative Urine IFE and SPEP, ultrasound showed 12-13 cm kidneys with increased echogenicity and UPC ratio was 1.5 gm proteinuria.  Hgb A1C's from 2011 to 2013 were all between 9-11.  Patient saw Dr. Donnetta Hutching (vasc surgery) for HD access in Aug 2013 > vein mapping was done and Dr. Donnetta Hutching felt the left arm (pt is R handed) was suitable for L arm Cimino radiocephalic fistula. Patient said he wasn't ready to consider doing dialysis and  declined the surgery.     . Depression   . Diabetic retinopathy ([place])   . ESRD (end stage renal disease) on dialysis ([place])   . UKGURKYH(062.3)    "maybe monthly" (03/15/2014)  . Hyperlipidemia   . Hypertension   . Macular edema   . Myocardial infarction [facility])    status post MI x2 and 3 stents placed in 2003  . Obesity   . Sleep apnea    does not use CPAP  . Stroke [facility]) ~ 2007; ~1987   "weak on right side; messed w/right side of brain; cry all the time"  . Type II diabetes mellitus (HCC)    insulin dependent    Past Surgical History:  Procedure Laterality Date  . AV FISTULA PLACEMENT  10/17/2012   Procedure: ARTERIOVENOUS (AV) FISTULA CREATION;  Surgeon: Mal Misty, MD;   Location: [place];  Service: Vascular;  Laterality: Left;  . BONE EXOSTOSIS EXCISION Right 06/04/2019   Procedure: EXOSTOSIS EXCISION;  Surgeon: Leanora Cover, MD;  Location: [place];  Service: Orthopedics;  Laterality: Right;  . CAPD INSERTION N/A 03/15/2014   Procedure: LAPAROSCOPIC INSERTION CONTINUOUS AMBULATORY PERITONEAL DIALYSIS CATHETER, LAPARASCOPIC INCISIONAL HERNIA  REPAIR  WITH MESH, OMENTOPEXY AND LYSIS OF ADHESIONS;  Surgeon: [name], MD;  Location: [place];  Service: General;  Laterality: N/A;  . CORONARY ANGIOPLASTY WITH STENT PLACEMENT  ~ 2002   "3"  . CORONARY ANGIOPLASTY WITH STENT PLACEMENT  01/24/2004   successful PCI/stenting RCA  drug eluting cypher stent  . CORONARY ANGIOPLASTY WITH STENT PLACEMENT  01/28/2004   successful stentin of a large bifurcation marginal branch of the ramus intermediate vessel  . FLEXIBLE SIGMOIDOSCOPY N/A 09/05/2014   Procedure: FLEXIBLE SIGMOIDOSCOPY;  Surgeon: Cleotis Nipper, MD;  Location[facility] END[facility]COPY;  Service: Endoscopy;  Laterality: N/A;  . HERNIA REPAIR    . INCISION AND DRAINAGE ABSCESS N/A 09/06/2014   Procedure: REMOVAL OF PD CATH;  Surgeon: Coralie Keens, MD;  Location: [place];  Service: General;  Laterality: N/A;  . INCISIONAL HERNIA REPAIR  03/15/2014  . LAPAROSCOPIC LYSIS OF ADHESIONS  03/15/2014  . NM MYOCAR PERF WALL MOTION  02/06/2012   normal perfusion scan  . PERITONEAL CATHETER INSERTION  03/15/2014  . REFRACTIVE SURGERY Bilateral   . SHUNTOGRAM N/A 03/11/2013   Procedure: Fistulogram;  Surgeon: Conrad [place], MD;  Location: [facility] CATH LAB;  Service: Cardiovascular;  Laterality: N/A;  . TENDON TRANSFER Right 06/04/2019   Procedure: RIGHT HAND EXTENSOR TENDON TRANSFER TO SMALL FINGER;  Surgeon: Leanora Cover, MD;  Location: [place];  Service: Orthopedics;  Laterality: Right;  . UMBILICAL HERNIA REPAIR      Allergies  Allergen Reactions  . Feraheme [Ferumoxytol] Itching    Severe  itching.    Prior to Admission medications   Medication Sig Start Date End Date Taking? Authorizing Provider  albuterol (PROVENTIL HFA;VENTOLIN HFA) 108 (90 BASE) MCG/ACT inhaler Inhale 2 puffs into the lungs every 4 (four) hours as needed for wheezing. For wheezing    [provider]  aspirin 325 MG tablet Take 325 mg by mouth daily.    [provider]  atorvastatin (LIPITOR) 40 MG tablet Take 40 mg by mouth at bedtime.    [provider]  B Complex-C-Biotin-E-Min-FA (DIALYVITE 5000 PO) Take by mouth.    [provider]  benzonatate (TESSALON) 100 MG capsule Take by mouth 3 (three) times daily as needed for cough.    [provider]  carvedilol (COREG) 25 MG tablet Take 37.5 mg by mouth 2 (two) times daily with a meal.    [provider]  cinacalcet (SENSIPAR) 60 MG tablet Take 60 mg by mouth daily.    [provider]  fluticasone (FLONASE) 50 MCG/ACT nasal spray Place 2 sprays into both nostrils daily.    [provider]  hydrALAZINE (APRESOLINE) 50 MG tablet Take 50 mg by mouth 3 (three) times daily.    [provider]  insulin detemir (LEVEMIR) 100 UNIT/ML injection Inject 75 Units into the skin 2 (two) times daily.    [provider]  insulin lispro (HUMALOG) 100 UNIT/ML injection Inject into the skin 3 (three) times daily with meals.    [provider]  oxyCODONE (ROXICODONE) 5 MG immediate release tablet 1-2 tabs PO q6 hours prn pain 06/04/19   Leanora Cover, MD  polyethylene glycol (MIRALAX / GLYCOLAX) packet Take 17 g by mouth daily. 09/08/14   Janora Norlander, DO  tiotropium (SPIRIVA) 18 MCG inhalation capsule Place 18 mcg into inhaler and inhale daily as needed (for shortness of breath).     [provider]  tobramycin (TOBREX) 0.3 % ophthalmic solution Place 1 drop into both eyes every 6 (six) hours as needed (for eye irritation).     [provider]    Social  History   Socioeconomic History  . Marital status: Divorced    Spouse name: Not on file  . Number of children: Not on file  . Years of education: Not on file  . Highest education level: Not on file  Occupational History  . Not on file  Tobacco Use  . Smoking status: Former Smoker    Packs/day: 1.00    Years: 15.00    Pack years: 15.00    Types: Cigarettes    Quit date: 09/24/1986    Years since quitting: 33.6  . Smokeless tobacco: Never Used  Substance and Sexual Activity  . Alcohol use: Not Currently    Comment: stopped in 2013  . Drug use: No  . Sexual activity: Not Currently  Other Topics Concern  . Not on file  Social History Narrative  . Not on file   Social Determinants of Health   Financial Resource Strain:   . Difficulty of Paying Living Expenses:   Food Insecurity:   . Worried About Charity fundraiser in the Last Year:   . Arboriculturist in the Last Year:   Transportation Needs:   . Film/video editor (Medical):   Marland Kitchen Lack of Transportation (Non-Medical):   Physical Activity:   . Days of Exercise per Week:   . Minutes of Exercise per Session:   Stress:   . Feeling of Stress :   Social Connections:   . Frequency of Communication with Friends and Family:   . Frequency of Social Gatherings with Friends and Family:   . Attends Religious Services:   . Active Member of Clubs or Organizations:   . Attends Archivist Meetings:   Marland Kitchen Marital Status:   Intimate Partner Violence:   . Fear of Current or Ex-Partner:   . Emotionally Abused:   Marland Kitchen Physically Abused:   . Sexually Abused:     Family History  Problem Relation Age of Onset  . Asthma Mother   . Hyperlipidemia Mother   . Hypertension Mother   . Stroke Father   . Heart attack Father   . Prostate cancer Father   .  Deep vein thrombosis Father   . Cancer Father   . Diabetes Father   . Hyperlipidemia Father   . Hypertension Father   . Other Father        varicose veins  . Heart disease  Father        before age 41  . Other Sister        varicose veins    ROS: Otherwise negative unless mentioned in HPI  Physical Examination  Vitals:   04/28/20 0959 04/28/20 1217  BP: 120/66 93/65  Pulse: 78 85  Resp: 14 (!) 23  Temp: 99.1 F (37.3 C)   SpO2: 96% 96%   There is no height or weight on file to calculate BMI.  General: very pleasant male, not in any acute distress, WDWN Gait: Not observed HENT: WNL, normocephalic Pulmonary: normal non-labored breathing, without wheezing Cardiac: irregular, without  Murmurs Abdomen: obese, soft, NT/ND, no masses Vascular Exam/Pulses: 2+ palpable femoral pulses, right foot warm, 2+ posterior tibial pulse. No palpable DP. motor and sensory intact. Biphasic DP and Pero on right, brisk PT signal. Left forefoot cool. Motor and sensory intact. He is able to plantar and dorsiflex. No palpable distal pulses in left foot. Doppler monophasic DP, PT Pero signals in left foot. Extremities: with ischemic changes of left forefoot and toes, dry gangrene of left distal second toe, epidermolysis of the dorsum and plantar left foot. Cellulitis of left forefoot with some erythema     Musculoskeletal: no muscle wasting or atrophy  Neurologic: A&O X 3;  No focal weakness or paresthesias are detected; speech is fluent/normal Psychiatric:  The pt has Normal affect.   CBC    Component Value Date/Time   WBC 11.4 (H) 04/27/2020 2137   RBC 3.17 (L) 04/27/2020 2137   HGB 8.4 (L) 04/27/2020 2137   HCT 28.4 (L) 04/27/2020 2137   PLT 366 04/27/2020 2137   MCV 89.6 04/27/2020 2137   MCH 26.5 04/27/2020 2137   MCHC 29.6 (L) 04/27/2020 2137   RDW 17.0 (H) 04/27/2020 2137   LYMPHSABS 1.3 09/04/2014 0905   MONOABS 1.3 (H) 09/04/2014 0905   EOSABS 0.4 09/04/2014 0905   BASOSABS 0.0 09/04/2014 0905    BMET    Component Value Date/Time   NA 133 (L) 04/27/2020 2137   K 4.6 04/27/2020 2137   CL 91 (L) 04/27/2020 2137   CO2 25 04/27/2020 2137    GLUCOSE 148 (H) 04/27/2020 2137   BUN 48 (H) 04/27/2020 2137   CREATININE 10.73 (H) 04/27/2020 2137   CREATININE 7.17 (H) 10/23/2012 1554   CALCIUM 9.2 04/27/2020 2137   GFRNONAA 5 (L) 04/27/2020 2137   GFRAA 5 (L) 04/27/2020 2137    COAGS: Lab Results  Component Value Date   INR 0.92 03/06/2013   INR 1.04 10/17/2012   INR 0.96 02/07/2010     Non-Invasive Vascular Imaging:   ABI and Arterial duplex pending  Plain films of left foot shows no signs of osteomyelitis or soft tissue swelling  Statin:  Yes.   Beta Blocker:  No. Aspirin:  Yes.   ACEI:  No. ARB:  No. CCB use:  Yes Other antiplatelets/anticoagulants:  No.    ASSESSMENT/PLAN: This is a 64 y.o. male who presents with ischemic changes of left foot. He has epidermolysis of forefoot and plantar left foot, left foot is cool. He has no palpable distal pulses in left foot. Monophasic signals by Doppler. He does have motor and sensation intact. He has mildly elevated leukocytosis  11.4 and had fever of 101 on presentation. He has been started on Vancomycin. ABI and Arterial duplex of left lower extremity are pending. In patient dialysis will need to be arranged. We will plan to do an Aortogram, Arteriogram with left lower extremity runoff for possible endovascular intervention tomorrow. He will need to be kept NPO after midnight. On call vascular surgeon Dr. Donnetta Hutching will evaluate the patient later today and provide further details on management plans   Karoline Caldwell PA-C [facility] [phone] 04/28/2020  1:05 PM   I have examined the patient, reviewed and agree with above.  Patient on chronic hemodialysis.  Also status post coronary bypass grafting with vein harvest from left leg approximately 2 years ago.  Done at an outlying facility.  Has a several week history of progressive gangrenous changes in his left foot.  Unfortunately according to his history he was seen several times by the [facility] and had no  vascular evaluation.  He has a profound ischemia to his foot.  We will proceed with arteriography tomorrow.  Explained that there is a slight chance that he would have lesion amenable to angioplasty but in all likelihood will require bypass on his left leg.  He has had vein harvested from his left leg so in all likelihood will require vein harvest from the right.  I did explain that at minimum he would have amputation of several toes and potentially have a transmetatarsal amputation then could not obtain healing is at high risk for below-knee amputation.  He obviously is quite interested in preventing this.  We will plan with arteriography tomorrow.  Curt Jews, MD 04/28/2020 3:23 PM

## 2020-04-28 NOTE — ED Provider Notes (Signed)
Medical screening examination/treatment/procedure(s) were conducted as a shared visit with non-physician practitioner(s) and myself.  I personally evaluated the patient during the encounter.    Patient with medical history including ESRD, CAD on type 2 diabetes.  Patient has had 1 month of problems with his left foot.  He has been going to the [facility] hospital.  He reports he was told he had infection and was opposed to be started on antibiotics.  He reports antibiotics were never initiated.  He has been trying to care for it by doing soaks and dressings.  He has been walking on the outside margin of the foot to avoid pressure on the great toe and second toe.  Patient reports his vision is very poor and he cannot appreciate the general appearance of redness of the toe and left foot.  He reports his skin has started to slough off some he has been trying to scrub it and clean it.  Patient is alert and appropriate.  No acute distress.  No respiratory distress.  Nontoxic.  Heart is regular.  Lungs are grossly clear.  No respiratory distress.  Abdomen is soft and nontender.  No significant rash or maceration beneath lower abdominal pannus fold.  Femoral pulses present bilaterally to palpation.  No effusion at the left knee.  Popliteal fossa is soft.  Cannot appreciate palpable pulse in the popliteal fossa.  Left foot has changes of necrotic appearance of second toe with dusky appearance.  Great toe has significant pallor and no capillary refill.  General sloughing of skin over the first 3 toes and most of forefoot.  No pulse present by Doppler.  Patient has advanced changes of an ischemic foot.  Consultations placed to vascular surgery and admission to medical team.  I agree with plan of management.   Charlesetta Shanks, MD 04/29/20 [phone]

## 2020-04-28 NOTE — H&P (Addendum)
[facility] Admission History and Physical Service Pager: [phone]  Patient name: Jonathon Bailey Medical record number: [id number] Date of birth: 1956/08/29 Age: 64 y.o. Gender: male  Primary Care Provider: [facility] Consultants: Vascular, Ortho Code Status: Full Code Preferred Emergency Contact: Keyshaun Exley (son), [phone]  Chief Complaint: Pain in toes, color change  Assessment and Plan: Jonathon Bailey is a 64 y.o. male presenting with wet gangrene of left toes. PMH is significant for CAD s/p 5 stents, CVA x2, MI x3, T2DM insulin dependent, ESRD , macular edema, HLD, HTN, HFrEF.  Wet gangrene of left toes Patient presented to ED with wet gangrene of the toes on the left foot.  Foot is cold, mottled, second toe is necrotic.  Patient appeared to have dorsalis pedis pulse to palpation.  There is increased concern for poor vascularization due to patient's significant history of CAD.  Vascular surgery did ABI and arterial duplex of left lower extremity are pending.  Vascular surgery noted decreased arterial perfusion in the left lower extremity.  This is likely contributing to poor healing.  Patient has also been cleaning his second toe with iodine and triple antibiotics cream every day for the past month. Patient is at increased risk for worsening infection/bacteremia due to his diagnosis of T2DM,insulin dependent. We will not order an MRI at this time per recommendation of vascular surgery due to gross necrosis of the wound.  Patient was febrile overnight with a temperature of 101, has since been afebrile.  Patient is asymptomatic for systemic infection.  At presentation WBC 11.4, Hgb 8.4, PLT 366.Na 133, K+ 4.6.  CR 10.73. Wound likely result of multifactorial medical problems including T2DM with superimposed PAD due to patient's extensive vasculopathy. Patient not meeting sepsis criteria but with vasculopathy and increased risk of  infection due to medical history, patient requires admission for vascular surgery evaluation.   - Admit to MedSurg, FPTS, Dr. Owens Shark - EKG for pre-op risk stratification  - Vascular surgery consulted and following  - Arteriogram in the AM - NPO @ [facility] -  Zosyn and vancomycin - F/U lactic acid - F/U CMP - F/U CBC - f/u Bcx - f/u ABI - f/u Arterial duplex  Constipation Patient describes leg pain that is shooting from the foot up to the hip. Reports that he only experiences pain when he is ambulating. Patient last took oxycodone on Monday, 8/2.  - Start MiraLAX  ESRD At presentation, K+ 4.6. Cr 10.73. Patient reports to HD per TThS schedule and missed session on day of admission due to waiting in ED. Patient reports being on HD for 6 years. Home medications include renvela, dialyvite. - Consult nephro, home ESRD medications per nephrology  - HD per outpatient schedule   T2DM Glucose at presentation 128.  Patient has a history of T2DM and is insulin-dependent.  Patient reports Levemir 75 U twice daily.  Humalog 100 U 3 times daily. Patient states he has not taken insulin in 2-3 days as his BG was not elevated.  - Sensitive sliding scale - We will hold home medications at this time - F/U HgbA1c   HTN BP at admission 103/57-148/90.  HR 51-95.  Patient home medication includes hydralazine 50mg  3 times daily.  Given lower blood pressures in ED, will hold home hydralazine. Plan to continue home Coreg once able to verify patient's home dose as he is prescribed 37.5mg  daily.  - Hold home hydralazine  - f/u on Coreg home dose  HLD Patient home medication is atorvastatin 40mg . - continue home atorvastatin  HFrEF  Patient reported as taking Coreg 6.25mg  BID. - Continue Coreg 25  Esophageal dysphagia Patient reports he has a history of food "getting stuck in his throat" and has been evaluated by the [place].  Patient reports he was found to have structural abnormality but eats at baseline.   Patient is also post 2 CVAs. - We will continue to monitor  CAD: Hx CVA x2, MI x3, CABG Patient is reported to take ASA 325 every other day. Will hold this prior to procedure with vascular tomorrow.  -Transfusion threshold <8  FEN/GI: Renal carb modified. NPO @ [facility] Prophylaxis: Heparin  Disposition: MedSurg  History of Present Illness:  Jonathon Bailey is a 64 y.o. male presenting with left foot pain that has been present for one month. He also noticed a wound on the second toe of his left foot.  He reports going to his podiatrist's at the [place].  He was initially going due to pain in both feet that was worse with walking.  He described this pain as shooting upward from his feet to his hips.  Patient received oxycodone for this pain and reports it also made him constipated.  He received triple antibiotics and iodine wipes from HD which he has used daily over the past month.  He noticed that the toe wound never dried.  He presented [facility] to [facility] ED  due to blackening of the second toe of the left foot and mottled discoloration spreading to the other toes.  He states that he rubbed the "dead skin" off and it looked more irritated.  Patient has been on HD for 6 years and still makes a small amount of urine.   Review Of Systems: Per HPI with the following additions:   Review of Systems  Constitutional: Positive for fever.  Respiratory: Positive for cough.   Cardiovascular: Positive for leg swelling. Negative for chest pain and palpitations.  Gastrointestinal: Positive for constipation. Negative for abdominal pain, blood in stool and vomiting.       Dysphagia  Musculoskeletal: Positive for gait problem.  Skin: Positive for wound.    Patient Active Problem List   Diagnosis Date Noted  . Cellulitis 04/28/2020  . Diabetic retinopathy ([place]) 08/21/2016  . Other specified fever   . Generalized abdominal pain   . Acute lower GI bleeding   . ESRD (end stage renal disease) ([place])   . Bacterial  peritonitis ([place])   . Blood poisoning   . ESRD on peritoneal dialysis ([place])   . Acute blood loss anemia   . Abdominal pain 09/01/2014  . CAPD (continuous ambulatory peritoneal dialysis) status 03/15/2014  . Morbid obesity ([place]) 02/01/2014  . 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 Preventative health care 12/02/2013  . Other complications due to renal dialysis device, implant, and graft 10/24/2012  . COPD (chronic obstructive pulmonary disease) ([place]) 10/23/2012  . DIABETIC PERIPHERAL NEUROPATHY 01/09/2010  . ESRD (end stage renal disease) on dialysis ([place]) 08/30/2009  . CEREBROVASCULAR ACCIDENT, HX OF 08/30/2009  . Diabetes mellitus type 2 with complications ([place]) 09/81/1914  . HYPERLIPIDEMIA 09/21/2008  . OBSTRUCTIVE SLEEP APNEA 09/21/2008  . ESSENTIAL HYPERTENSION 09/21/2008  . CORONARY ARTERY DISEASE 09/21/2008  . Chronic combined systolic and diastolic heart failure ([place]) 09/21/2008    Past Medical History: Past Medical History:  Diagnosis Date  . Anxiety   . Arthritis   . Asthma    "  as a child"  . CAD (coronary artery disease)   . Chronic systolic heart failure ([place]) 09/21/2008   ECHO Feb 2013 showed LVEF low normal at 50-55%, +hypokinetic anterolateral wall and inferolateral wall.    . CKD (chronic kidney disease) stage 4, GFR 15-29 ml/min (HCC) 08/30/2009   Progressive renal failure since 2008, creatinine 1.2 in 2008 up to 3.5 in 2012 and 3.2-5.0 in 2013. All UA's 2011-13 showed >300 protein on dipstick. Work-up in May 2011 showed negative Urine IFE and SPEP, ultrasound showed 12-13 cm kidneys with increased echogenicity and UPC ratio was 1.5 gm proteinuria.  Hgb A1C's from 2011 to 2013 were all between 9-11.  Patient saw Dr. Donnetta Hutching (vasc surgery) for HD access in Aug 2013 > vein mapping was done and Dr. Donnetta Hutching felt the left arm (pt is R handed) was suitable for L arm Cimino radiocephalic fistula. Patient said he wasn't  ready to consider doing dialysis and declined the surgery.     . Depression   . Diabetic retinopathy ([place])   . ESRD (end stage renal disease) on dialysis ([place])   . YSAYTKZS(010.9)    "maybe monthly" (03/15/2014)  . Hyperlipidemia   . Hypertension   . Macular edema   . Myocardial infarction [facility])    status post MI x2 and 3 stents placed in 2003  . Obesity   . Sleep apnea    does not use CPAP  . Stroke [facility]) ~ 2007; ~1987   "weak on right side; messed w/right side of brain; cry all the time"  . Type II diabetes mellitus (HCC)    insulin dependent    Past Surgical History: Past Surgical History:  Procedure Laterality Date  . AV FISTULA PLACEMENT  10/17/2012   Procedure: ARTERIOVENOUS (AV) FISTULA CREATION;  Surgeon: Mal Misty, MD;  Location: [place];  Service: Vascular;  Laterality: Left;  . BONE EXOSTOSIS EXCISION Right 06/04/2019   Procedure: EXOSTOSIS EXCISION;  Surgeon: Leanora Cover, MD;  Location: [place];  Service: Orthopedics;  Laterality: Right;  . CAPD INSERTION N/A 03/15/2014   Procedure: LAPAROSCOPIC INSERTION CONTINUOUS AMBULATORY PERITONEAL DIALYSIS CATHETER, LAPARASCOPIC INCISIONAL HERNIA  REPAIR  WITH MESH, OMENTOPEXY AND LYSIS OF ADHESIONS;  Surgeon: [name], MD;  Location: [place];  Service: General;  Laterality: N/A;  . CORONARY ANGIOPLASTY WITH STENT PLACEMENT  ~ 2002   "3"  . CORONARY ANGIOPLASTY WITH STENT PLACEMENT  01/24/2004   successful PCI/stenting RCA  drug eluting cypher stent  . CORONARY ANGIOPLASTY WITH STENT PLACEMENT  01/28/2004   successful stentin of a large bifurcation marginal branch of the ramus intermediate vessel  . FLEXIBLE SIGMOIDOSCOPY N/A 09/05/2014   Procedure: FLEXIBLE SIGMOIDOSCOPY;  Surgeon: Cleotis Nipper, MD;  Location[facility] ENDOSCOPY;  Service: Endoscopy;  Laterality: N/A;  . HERNIA REPAIR    . INCISION AND DRAINAGE ABSCESS N/A 09/06/2014   Procedure: REMOVAL OF PD CATH;  Surgeon: Coralie Keens, MD;   Location: [place];  Service: General;  Laterality: N/A;  . INCISIONAL HERNIA REPAIR  03/15/2014  . LAPAROSCOPIC LYSIS OF ADHESIONS  03/15/2014  . NM MYOCAR PERF WALL MOTION  02/06/2012   normal perfusion scan  . PERITONEAL CATHETER INSERTION  03/15/2014  . REFRACTIVE SURGERY Bilateral   . SHUNTOGRAM N/A 03/11/2013   Procedure: Fistulogram;  Surgeon: Conrad [place], MD;  Location: [facility] CATH LAB;  Service: Cardiovascular;  Laterality: N/A;  . TENDON TRANSFER Right 06/04/2019   Procedure: RIGHT HAND EXTENSOR TENDON TRANSFER TO SMALL FINGER;  Surgeon: Leanora Cover, MD;  Location: [place];  Service: Orthopedics;  Laterality: Right;  . UMBILICAL HERNIA REPAIR      Social History: Social History   Tobacco Use  . Smoking status: Former Smoker    Packs/day: 1.00    Years: 15.00    Pack years: 15.00    Types: Cigarettes    Quit date: 09/24/1986    Years since quitting: 33.6  . Smokeless tobacco: Never Used  Substance Use Topics  . Alcohol use: Not Currently    Comment: stopped in 2013  . Drug use: No   Additional social history: Please also refer to relevant sections of EMR.  Family History: Family History  Problem Relation Age of Onset  . Asthma Mother   . Hyperlipidemia Mother   . Hypertension Mother   . Stroke Father   . Heart attack Father   . Prostate cancer Father   . Deep vein thrombosis Father   . Cancer Father   . Diabetes Father   . Hyperlipidemia Father   . Hypertension Father   . Other Father        varicose veins  . Heart disease Father        before age 49  . Other Sister        varicose veins     Allergies and Medications: Allergies  Allergen Reactions  . Ferumoxytol Itching       . Iron Itching        . Iodinated Diagnostic Agents Itching  . Lactose Other (See Comments)    GI issues   No current facility-administered medications on file prior to encounter.   Current Outpatient Medications on File Prior to Encounter  Medication Sig  Dispense Refill  . acetaminophen (TYLENOL) 325 MG tablet Take 650 mg by mouth every 6 (six) hours as needed for pain.    . diphenhydrAMINE (BENADRYL) 25 mg capsule Take 1 tablet by mouth every 6 (six) hours as needed for allergies.    . Methoxy PEG-Epoetin Beta (MIRCERA IJ) Inject 30 mcg as directed.    Marland Kitchen albuterol (PROVENTIL HFA;VENTOLIN HFA) 108 (90 BASE) MCG/ACT inhaler Inhale 2 puffs into the lungs every 4 (four) hours as needed for wheezing. For wheezing    . atorvastatin (LIPITOR) 40 MG tablet Take 40 mg by mouth at bedtime.    . B Complex-C-Folic Acid (DIALYVITE 026) 0.8 MG TABS Take 1 tablet by mouth daily.    . fluticasone (FLONASE) 50 MCG/ACT nasal spray Place 2 sprays into both nostrils daily.    . hydrALAZINE (APRESOLINE) 50 MG tablet Take 50 mg by mouth 3 (three) times daily.    . insulin detemir (LEVEMIR) 100 UNIT/ML injection Inject 75 Units into the skin 2 (two) times daily.    Marland Kitchen oxyCODONE (ROXICODONE) 5 MG immediate release tablet 1-2 tabs PO q6 hours prn pain (Patient not taking: Reported on 04/28/2020) 20 tablet 0  . polyethylene glycol (MIRALAX / GLYCOLAX) packet Take 17 g by mouth daily. (Patient not taking: Reported on 04/28/2020) 14 each 0  . tobramycin (TOBREX) 0.3 % ophthalmic solution Place 1 drop into both eyes every 6 (six) hours as needed (for eye irritation).       Objective: BP (!) 157/81 (BP Location: Right Arm)   Pulse 83   Temp 98.2 F (36.8 C) (Oral)   Resp 17   SpO2 99%  Exam: General: Patient lying in bed comfortably, NAD, appropriate mood and affect Eyes: Clear scleral icterus, EOM ENTM:  Moist Neck: No cervical lymphadenopathy noted Cardiovascular: Regular rate and rhythm no murmur detected, left foot is cold Respiratory: Clear and equal bilaterally no extra work of breathing noted patient breathing on room air Gastrointestinal: Normoactive bowel sounds, tender to palpation in all 4 quadrants Derm:epidermolysis of the dorsum and plantar left foot on the  distal portion of the dorsum and plantar left foot, mottled and discolored toes, second toe is black.  Image is located in vascular surgery's note. Neuro: decreased sensation to L5,S1 on left foot.  No cranial nerve deficits noted.   Labs and Imaging: CBC BMET  Recent Labs  Lab 04/27/20 2137  WBC 11.4*  HGB 8.4*  HCT 28.4*  PLT 366   Recent Labs  Lab 04/27/20 2137  NA 133*  K 4.6  CL 91*  CO2 25  BUN 48*  CREATININE 10.73*  GLUCOSE 148*  CALCIUM 9.2     EKG: Pending  Damita Dunnings, MD  PGY-1, [place] Intern pager: [phone], text pages welcome  FPTS Upper-Level Resident Addendum   I have independently interviewed and examined the patient. I have discussed the above with the original author and agree with their documentation. My edits for correction/addition/clarification are in blue. Please see also any attending notes.   Eulis Foster, MD PGY-2, [place] [facility] 04/28/2020 6:11 PM  FPTS Service pager: [phone] (text pages welcome through [place])

## 2020-04-28 NOTE — ED Notes (Signed)
RN spoke with Pharmacist regarding patients meds; RN has no information on patient at this time as he just arrived unit; Pharmacist will look into chart and advise what needs to be given-Monique,RN

## 2020-04-28 NOTE — Consult Note (Addendum)
Hospital Consult    Reason for Consult: cold left foot, black toes Requesting Physician:  MRN #:  [id number]  History of Present Illness: This is a 64 y.o. male with medical history significant for CAD s/p CABG, ESRD on HD via TDC, CHF, hyperlipidemia, hypertension and DM who was seen at Urgent care yesterday and was sent to ED due to concerns about left foot ulcerations and coldness. Patient states that approximately 4 weeks ago he noticed some skin coming off of his left 2nd toe and he pulled off the skin. He had been doing Epson salt soaks and applying witch hazel and trying to keep area clean however it continued to worsen. He subsequently went to [place] for evaluation and states that he was told to paint it with iodine and keep foot protected. He says they told him he needed antibiotics but he never received them. He went to the [place] on two other occasions over past several weeks and says he never received any antibiotics even though he was told that he should. On Monday 8/2 he says that he showed it to one of the RNs at dialysis who told him to apply iodine and antibiotic gel. He also showed his foot to his son who encouraged him to go get it evaluated so he finally presented to ER however due to long wait time he decided to go to Urgent care yesterday. Urgent care advised him to go to the ER. He does report that he has pain in left leg all the way from foot to left hip, but this has been present for many years since he was in the service. He does describe claudication in the left leg- explaining that his pain occurs on ambulation and is improved after several minutes of rest. He has no rest pain. He states that he has full feeling in his left foot and toes and is able to move his toes but they just " feet swollen". His left leg stays swollen compared to the right leg since his CABG. He overall is very active. States he lives alone so he has to do all his ADL's and gets around without difficulty despite  chronic left leg pain. He denies any fever or chills, nausea, vomiting, chest pain or shortness of breath. He is former smoker who quit in 1988.  Vascular surgery has been consulted too see the patient due to concerns about his left foot ulcerations and coldness  Past Medical History:  Diagnosis Date  . Anxiety   . Arthritis   . Asthma    "as a child"  . CAD (coronary artery disease)   . Chronic systolic heart failure ([place]) 09/21/2008   ECHO Feb 2013 showed LVEF low normal at 50-55%, +hypokinetic anterolateral wall and inferolateral wall.    . CKD (chronic kidney disease) stage 4, GFR 15-29 ml/min (HCC) 08/30/2009   Progressive renal failure since 2008, creatinine 1.2 in 2008 up to 3.5 in 2012 and 3.2-5.0 in 2013. All UA's 2011-13 showed >300 protein on dipstick. Work-up in May 2011 showed negative Urine IFE and SPEP, ultrasound showed 12-13 cm kidneys with increased echogenicity and UPC ratio was 1.5 gm proteinuria.  Hgb A1C's from 2011 to 2013 were all between 9-11.  Patient saw Dr. Donnetta Hutching (vasc surgery) for HD access in Aug 2013 > vein mapping was done and Dr. Donnetta Hutching felt the left arm (pt is R handed) was suitable for L arm Cimino radiocephalic fistula. Patient said he wasn't ready to consider doing dialysis and  declined the surgery.     . Depression   . Diabetic retinopathy ([place])   . ESRD (end stage renal disease) on dialysis ([place])   . TFTDDUKG(254.2)    "maybe monthly" (03/15/2014)  . Hyperlipidemia   . Hypertension   . Macular edema   . Myocardial infarction [facility])    status post MI x2 and 3 stents placed in 2003  . Obesity   . Sleep apnea    does not use CPAP  . Stroke [facility]) ~ 2007; ~1987   "weak on right side; messed w/right side of brain; cry all the time"  . Type II diabetes mellitus (HCC)    insulin dependent    Past Surgical History:  Procedure Laterality Date  . AV FISTULA PLACEMENT  10/17/2012   Procedure: ARTERIOVENOUS (AV) FISTULA CREATION;  Surgeon: Mal Misty, MD;   Location: [place];  Service: Vascular;  Laterality: Left;  . BONE EXOSTOSIS EXCISION Right 06/04/2019   Procedure: EXOSTOSIS EXCISION;  Surgeon: Leanora Cover, MD;  Location: [place];  Service: Orthopedics;  Laterality: Right;  . CAPD INSERTION N/A 03/15/2014   Procedure: LAPAROSCOPIC INSERTION CONTINUOUS AMBULATORY PERITONEAL DIALYSIS CATHETER, LAPARASCOPIC INCISIONAL HERNIA  REPAIR  WITH MESH, OMENTOPEXY AND LYSIS OF ADHESIONS;  Surgeon: [name], MD;  Location: [place];  Service: General;  Laterality: N/A;  . CORONARY ANGIOPLASTY WITH STENT PLACEMENT  ~ 2002   "3"  . CORONARY ANGIOPLASTY WITH STENT PLACEMENT  01/24/2004   successful PCI/stenting RCA  drug eluting cypher stent  . CORONARY ANGIOPLASTY WITH STENT PLACEMENT  01/28/2004   successful stentin of a large bifurcation marginal branch of the ramus intermediate vessel  . FLEXIBLE SIGMOIDOSCOPY N/A 09/05/2014   Procedure: FLEXIBLE SIGMOIDOSCOPY;  Surgeon: Cleotis Nipper, MD;  Location: [facility] ENDOSCOPY;  Service: Endoscopy;  Laterality: N/A;  . HERNIA REPAIR    . INCISION AND DRAINAGE ABSCESS N/A 09/06/2014   Procedure: REMOVAL OF PD CATH;  Surgeon: Coralie Keens, MD;  Location: [place];  Service: General;  Laterality: N/A;  . INCISIONAL HERNIA REPAIR  03/15/2014  . LAPAROSCOPIC LYSIS OF ADHESIONS  03/15/2014  . NM MYOCAR PERF WALL MOTION  02/06/2012   normal perfusion scan  . PERITONEAL CATHETER INSERTION  03/15/2014  . REFRACTIVE SURGERY Bilateral   . SHUNTOGRAM N/A 03/11/2013   Procedure: Fistulogram;  Surgeon: Conrad Glenvil, MD;  Location: [facility] CATH LAB;  Service: Cardiovascular;  Laterality: N/A;  . TENDON TRANSFER Right 06/04/2019   Procedure: RIGHT HAND EXTENSOR TENDON TRANSFER TO SMALL FINGER;  Surgeon: Leanora Cover, MD;  Location: [place];  Service: Orthopedics;  Laterality: Right;  . UMBILICAL HERNIA REPAIR      Allergies  Allergen Reactions  . Feraheme [Ferumoxytol] Itching    Severe  itching.    Prior to Admission medications   Medication Sig Start Date End Date Taking? Authorizing Provider  albuterol (PROVENTIL HFA;VENTOLIN HFA) 108 (90 BASE) MCG/ACT inhaler Inhale 2 puffs into the lungs every 4 (four) hours as needed for wheezing. For wheezing    [provider]  aspirin 325 MG tablet Take 325 mg by mouth daily.    [provider]  atorvastatin (LIPITOR) 40 MG tablet Take 40 mg by mouth at bedtime.    [provider]  B Complex-C-Biotin-E-Min-FA (DIALYVITE 5000 PO) Take by mouth.    [provider]  benzonatate (TESSALON) 100 MG capsule Take by mouth 3 (three) times daily as needed for cough.    [provider]  carvedilol (COREG) 25 MG tablet Take 37.5 mg by mouth 2 (two) times daily with a meal.    [provider]  cinacalcet (SENSIPAR) 60 MG tablet Take 60 mg by mouth daily.    [provider]  fluticasone (FLONASE) 50 MCG/ACT nasal spray Place 2 sprays into both nostrils daily.    [provider]  hydrALAZINE (APRESOLINE) 50 MG tablet Take 50 mg by mouth 3 (three) times daily.    [provider]  insulin detemir (LEVEMIR) 100 UNIT/ML injection Inject 75 Units into the skin 2 (two) times daily.    [provider]  insulin lispro (HUMALOG) 100 UNIT/ML injection Inject into the skin 3 (three) times daily with meals.    [provider]  oxyCODONE (ROXICODONE) 5 MG immediate release tablet 1-2 tabs PO q6 hours prn pain 06/04/19   Leanora Cover, MD  polyethylene glycol (MIRALAX / GLYCOLAX) packet Take 17 g by mouth daily. 09/08/14   Janora Norlander, DO  tiotropium (SPIRIVA) 18 MCG inhalation capsule Place 18 mcg into inhaler and inhale daily as needed (for shortness of breath).     [provider]  tobramycin (TOBREX) 0.3 % ophthalmic solution Place 1 drop into both eyes every 6 (six) hours as needed (for eye irritation).     [provider]    Social  History   Socioeconomic History  . Marital status: Divorced    Spouse name: Not on file  . Number of children: Not on file  . Years of education: Not on file  . Highest education level: Not on file  Occupational History  . Not on file  Tobacco Use  . Smoking status: Former Smoker    Packs/day: 1.00    Years: 15.00    Pack years: 15.00    Types: Cigarettes    Quit date: 09/24/1986    Years since quitting: 33.6  . Smokeless tobacco: Never Used  Substance and Sexual Activity  . Alcohol use: Not Currently    Comment: stopped in 2013  . Drug use: No  . Sexual activity: Not Currently  Other Topics Concern  . Not on file  Social History Narrative  . Not on file   Social Determinants of Health   Financial Resource Strain:   . Difficulty of Paying Living Expenses:   Food Insecurity:   . Worried About Charity fundraiser in the Last Year:   . Arboriculturist in the Last Year:   Transportation Needs:   . Film/video editor (Medical):   Marland Kitchen Lack of Transportation (Non-Medical):   Physical Activity:   . Days of Exercise per Week:   . Minutes of Exercise per Session:   Stress:   . Feeling of Stress :   Social Connections:   . Frequency of Communication with Friends and Family:   . Frequency of Social Gatherings with Friends and Family:   . Attends Religious Services:   . Active Member of Clubs or Organizations:   . Attends Archivist Meetings:   Marland Kitchen Marital Status:   Intimate Partner Violence:   . Fear of Current or Ex-Partner:   . Emotionally Abused:   Marland Kitchen Physically Abused:   . Sexually Abused:     Family History  Problem Relation Age of Onset  . Asthma Mother   . Hyperlipidemia Mother   . Hypertension Mother   . Stroke Father   . Heart attack Father   . Prostate cancer Father   .  Deep vein thrombosis Father   . Cancer Father   . Diabetes Father   . Hyperlipidemia Father   . Hypertension Father   . Other Father        varicose veins  . Heart disease  Father        before age 72  . Other Sister        varicose veins    ROS: Otherwise negative unless mentioned in HPI  Physical Examination  Vitals:   04/28/20 0959 04/28/20 1217  BP: 120/66 93/65  Pulse: 78 85  Resp: 14 (!) 23  Temp: 99.1 F (37.3 C)   SpO2: 96% 96%   There is no height or weight on file to calculate BMI.  General: very pleasant male, not in any acute distress, WDWN Gait: Not observed HENT: WNL, normocephalic Pulmonary: normal non-labored breathing, without wheezing Cardiac: irregular, without  Murmurs Abdomen: obese, soft, NT/ND, no masses Vascular Exam/Pulses: 2+ palpable femoral pulses, right foot warm, 2+ posterior tibial pulse. No palpable DP. motor and sensory intact. Biphasic DP and Pero on right, brisk PT signal. Left forefoot cool. Motor and sensory intact. He is able to plantar and dorsiflex. No palpable distal pulses in left foot. Doppler monophasic DP, PT Pero signals in left foot. Extremities: with ischemic changes of left forefoot and toes, dry gangrene of left distal second toe, epidermolysis of the dorsum and plantar left foot. Cellulitis of left forefoot with some erythema     Musculoskeletal: no muscle wasting or atrophy  Neurologic: A&O X 3;  No focal weakness or paresthesias are detected; speech is fluent/normal Psychiatric:  The pt has Normal affect.   CBC    Component Value Date/Time   WBC 11.4 (H) 04/27/2020 2137   RBC 3.17 (L) 04/27/2020 2137   HGB 8.4 (L) 04/27/2020 2137   HCT 28.4 (L) 04/27/2020 2137   PLT 366 04/27/2020 2137   MCV 89.6 04/27/2020 2137   MCH 26.5 04/27/2020 2137   MCHC 29.6 (L) 04/27/2020 2137   RDW 17.0 (H) 04/27/2020 2137   LYMPHSABS 1.3 09/04/2014 0905   MONOABS 1.3 (H) 09/04/2014 0905   EOSABS 0.4 09/04/2014 0905   BASOSABS 0.0 09/04/2014 0905    BMET    Component Value Date/Time   NA 133 (L) 04/27/2020 2137   K 4.6 04/27/2020 2137   CL 91 (L) 04/27/2020 2137   CO2 25 04/27/2020 2137    GLUCOSE 148 (H) 04/27/2020 2137   BUN 48 (H) 04/27/2020 2137   CREATININE 10.73 (H) 04/27/2020 2137   CREATININE 7.17 (H) 10/23/2012 1554   CALCIUM 9.2 04/27/2020 2137   GFRNONAA 5 (L) 04/27/2020 2137   GFRAA 5 (L) 04/27/2020 2137    COAGS: Lab Results  Component Value Date   INR 0.92 03/06/2013   INR 1.04 10/17/2012   INR 0.96 02/07/2010     Non-Invasive Vascular Imaging:   ABI and Arterial duplex pending  Plain films of left foot shows no signs of osteomyelitis or soft tissue swelling  Statin:  Yes.   Beta Blocker:  No. Aspirin:  Yes.   ACEI:  No. ARB:  No. CCB use:  Yes Other antiplatelets/anticoagulants:  No.    ASSESSMENT/PLAN: This is a 64 y.o. male who presents with ischemic changes of left foot. He has epidermolysis of forefoot and plantar left foot, left foot is cool. He has no palpable distal pulses in left foot. Monophasic signals by Doppler. He does have motor and sensation intact. He has mildly elevated leukocytosis  11.4 and had fever of 101 on presentation. He has been started on Vancomycin. ABI and Arterial duplex of left lower extremity are pending. In patient dialysis will need to be arranged. We will plan to do an Aortogram, Arteriogram with left lower extremity runoff for possible endovascular intervention tomorrow. He will need to be kept NPO after midnight. On call vascular surgeon Dr. Donnetta Hutching will evaluate the patient later today and provide further details on management plans   Karoline Caldwell PA-C [facility] [phone] 04/28/2020  1:05 PM   I have examined the patient, reviewed and agree with above.  Patient on chronic hemodialysis.  Also status post coronary bypass grafting with vein harvest from left leg approximately 2 years ago.  Done at an outlying facility.  Has a several week history of progressive gangrenous changes in his left foot.  Unfortunately according to his history he was seen several times by the [facility] and had no  vascular evaluation.  He has a profound ischemia to his foot.  We will proceed with arteriography tomorrow.  Explained that there is a slight chance that he would have lesion amenable to angioplasty but in all likelihood will require bypass on his left leg.  He has had vein harvested from his left leg so in all likelihood will require vein harvest from the right.  I did explain that at minimum he would have amputation of several toes and potentially have a transmetatarsal amputation then could not obtain healing is at high risk for below-knee amputation.  He obviously is quite interested in preventing this.  We will plan with arteriography tomorrow.  Curt Jews, MD 04/28/2020 3:23 PM

## 2020-04-29 ENCOUNTER — Encounter (HOSPITAL_COMMUNITY): Payer: Self-pay | Admitting: Family Medicine

## 2020-04-29 ENCOUNTER — Ambulatory Visit (HOSPITAL_COMMUNITY): Admission: RE | Admit: 2020-04-29 | Payer: Medicare Other | Source: Home / Self Care | Admitting: Vascular Surgery

## 2020-04-29 ENCOUNTER — Encounter (HOSPITAL_COMMUNITY)
Admission: EM | Disposition: A | Discharge status: Rehabilitation Facility | Payer: Self-pay | Source: Home / Self Care | Attending: Family Medicine

## 2020-04-29 DIAGNOSIS — I2510 Atherosclerotic heart disease of native coronary artery without angina pectoris: Secondary | ICD-10-CM

## 2020-04-29 DIAGNOSIS — I4510 Unspecified right bundle-branch block: Secondary | ICD-10-CM

## 2020-04-29 DIAGNOSIS — E118 Type 2 diabetes mellitus with unspecified complications: Secondary | ICD-10-CM | POA: Diagnosis not present

## 2020-04-29 DIAGNOSIS — A419 Sepsis, unspecified organism: Secondary | ICD-10-CM | POA: Diagnosis not present

## 2020-04-29 DIAGNOSIS — I96 Gangrene, not elsewhere classified: Secondary | ICD-10-CM | POA: Diagnosis not present

## 2020-04-29 DIAGNOSIS — I998 Other disorder of circulatory system: Secondary | ICD-10-CM

## 2020-04-29 DIAGNOSIS — I252 Old myocardial infarction: Secondary | ICD-10-CM

## 2020-04-29 DIAGNOSIS — N186 End stage renal disease: Secondary | ICD-10-CM | POA: Diagnosis not present

## 2020-04-29 HISTORY — PX: ABDOMINAL AORTOGRAM W/LOWER EXTREMITY: CATH118223

## 2020-04-29 LAB — CBC WITH DIFFERENTIAL/PLATELET
Abs Immature Granulocytes: 0.07 10*3/uL (ref 0.00–0.07)
Basophils Absolute: 0 10*3/uL (ref 0.0–0.1)
Basophils Relative: 0 %
Eosinophils Absolute: 0 10*3/uL (ref 0.0–0.5)
Eosinophils Relative: 0 %
HCT: 27.9 % — ABNORMAL LOW (ref 39.0–52.0)
Hemoglobin: 8.4 g/dL — ABNORMAL LOW (ref 13.0–17.0)
Immature Granulocytes: 1 %
Lymphocytes Relative: 7 %
Lymphs Abs: 1 10*3/uL (ref 0.7–4.0)
MCH: 25.9 pg — ABNORMAL LOW (ref 26.0–34.0)
MCHC: 30.1 g/dL (ref 30.0–36.0)
MCV: 86.1 fL (ref 80.0–100.0)
Monocytes Absolute: 0.6 10*3/uL (ref 0.1–1.0)
Monocytes Relative: 4 %
Neutro Abs: 11.3 10*3/uL — ABNORMAL HIGH (ref 1.7–7.7)
Neutrophils Relative %: 88 %
Platelets: 351 10*3/uL (ref 150–400)
RBC: 3.24 MIL/uL — ABNORMAL LOW (ref 4.22–5.81)
RDW: 16.8 % — ABNORMAL HIGH (ref 11.5–15.5)
WBC: 13 10*3/uL — ABNORMAL HIGH (ref 4.0–10.5)
nRBC: 0 % (ref 0.0–0.2)

## 2020-04-29 LAB — GLUCOSE, CAPILLARY
Glucose-Capillary: 148 mg/dL — ABNORMAL HIGH (ref 70–99)
Glucose-Capillary: 118 mg/dL — ABNORMAL HIGH (ref 70–99)
Glucose-Capillary: 146 mg/dL — ABNORMAL HIGH (ref 70–99)
Glucose-Capillary: 266 mg/dL — ABNORMAL HIGH (ref 70–99)
Glucose-Capillary: 283 mg/dL — ABNORMAL HIGH (ref 70–99)
Glucose-Capillary: 317 mg/dL — ABNORMAL HIGH (ref 70–99)

## 2020-04-29 LAB — COMPREHENSIVE METABOLIC PANEL
ALT: 26 U/L (ref 0–44)
AST: 24 U/L (ref 15–41)
Albumin: 2.5 g/dL — ABNORMAL LOW (ref 3.5–5.0)
Alkaline Phosphatase: 73 U/L (ref 38–126)
Anion gap: 14 (ref 5–15)
BUN: 31 mg/dL — ABNORMAL HIGH (ref 8–23)
CO2: 27 mmol/L (ref 22–32)
Calcium: 9.3 mg/dL (ref 8.9–10.3)
Chloride: 96 mmol/L — ABNORMAL LOW (ref 98–111)
Creatinine, Ser: 7.83 mg/dL — ABNORMAL HIGH (ref 0.61–1.24)
GFR calc Af Amer: 8 mL/min — ABNORMAL LOW (ref >60)
GFR calc non Af Amer: 7 mL/min — ABNORMAL LOW (ref >60)
Glucose, Bld: 152 mg/dL — ABNORMAL HIGH (ref 70–99)
Potassium: 4.7 mmol/L (ref 3.5–5.1)
Sodium: 137 mmol/L (ref 135–145)
Total Bilirubin: 0.6 mg/dL (ref 0.3–1.2)
Total Protein: 7.8 g/dL (ref 6.5–8.1)

## 2020-04-29 LAB — CBC
HCT: 26.2 % — ABNORMAL LOW (ref 39.0–52.0)
HCT: 28.2 % — ABNORMAL LOW (ref 39.0–52.0)
Hemoglobin: 8 g/dL — ABNORMAL LOW (ref 13.0–17.0)
Hemoglobin: 8.4 g/dL — ABNORMAL LOW (ref 13.0–17.0)
MCH: 26.7 pg (ref 26.0–34.0)
MCH: 26.1 pg (ref 26.0–34.0)
MCHC: 30.5 g/dL (ref 30.0–36.0)
MCHC: 29.8 g/dL — ABNORMAL LOW (ref 30.0–36.0)
MCV: 87.3 fL (ref 80.0–100.0)
MCV: 87.6 fL (ref 80.0–100.0)
Platelets: 377 10*3/uL (ref 150–400)
Platelets: 361 10*3/uL (ref 150–400)
RBC: 3 MIL/uL — ABNORMAL LOW (ref 4.22–5.81)
RBC: 3.22 MIL/uL — ABNORMAL LOW (ref 4.22–5.81)
RDW: 16.8 % — ABNORMAL HIGH (ref 11.5–15.5)
RDW: 17 % — ABNORMAL HIGH (ref 11.5–15.5)
WBC: 11.6 10*3/uL — ABNORMAL HIGH (ref 4.0–10.5)
WBC: 12.6 10*3/uL — ABNORMAL HIGH (ref 4.0–10.5)
nRBC: 0 % (ref 0.0–0.2)
nRBC: 0 % (ref 0.0–0.2)

## 2020-04-29 LAB — HEMOGLOBIN A1C
Hgb A1c MFr Bld: 7.9 % — ABNORMAL HIGH (ref 4.8–5.6)
Mean Plasma Glucose: 180.03 mg/dL

## 2020-04-29 LAB — LIPID PANEL
Cholesterol: 142 mg/dL (ref 0–200)
HDL: 28 mg/dL — ABNORMAL LOW (ref >40)
LDL Cholesterol: 79 mg/dL (ref 0–99)
Total CHOL/HDL Ratio: 5.1 RATIO
Triglycerides: 177 mg/dL — ABNORMAL HIGH (ref <150)
VLDL: 35 mg/dL (ref 0–40)

## 2020-04-29 LAB — LACTIC ACID, PLASMA: Lactic Acid, Venous: 1.1 mmol/L (ref 0.5–1.9)

## 2020-04-29 LAB — PHOSPHORUS: Phosphorus: 5.9 mg/dL — ABNORMAL HIGH (ref 2.5–4.6)

## 2020-04-29 SURGERY — ABDOMINAL AORTOGRAM W/LOWER EXTREMITY
Anesthesia: LOCAL

## 2020-04-29 MED ORDER — HEPARIN (PORCINE) IN NACL 1000-0.9 UT/500ML-% IV SOLN
INTRAVENOUS | Status: AC
  Filled 2020-04-29: qty 1000

## 2020-04-29 MED ORDER — ACETAMINOPHEN 325 MG PO TABS: 650.0000 mg | ORAL_TABLET | ORAL | Status: DC | PRN

## 2020-04-29 MED ORDER — SODIUM CHLORIDE 0.9 % IV SOLN: 250.0000 mL | INTRAVENOUS | Status: DC | PRN

## 2020-04-29 MED ORDER — ASPIRIN 81 MG PO CHEW
81.0000 mg | CHEWABLE_TABLET | ORAL | Status: DC
  Administered 2020-04-29 – 2020-05-05 (×4): 81 mg via ORAL
  Filled 2020-04-29 (×5): qty 1

## 2020-04-29 MED ORDER — LABETALOL HCL 5 MG/ML IV SOLN: 10.0000 mg | INTRAVENOUS | Status: DC | PRN

## 2020-04-29 MED ORDER — INSULIN GLARGINE 100 UNIT/ML ~~LOC~~ SOLN
10.0000 [IU] | Freq: Every day | SUBCUTANEOUS | Status: DC
  Administered 2020-04-29: 10 [IU] via SUBCUTANEOUS
  Filled 2020-04-29 (×2): qty 0.1

## 2020-04-29 MED ORDER — LIDOCAINE HCL (PF) 1 % IJ SOLN
INTRAMUSCULAR | Status: AC
  Filled 2020-04-29: qty 30

## 2020-04-29 MED ORDER — ONDANSETRON HCL 4 MG/2ML IJ SOLN: 4.0000 mg | Freq: Four times a day (QID) | INTRAMUSCULAR | Status: DC | PRN

## 2020-04-29 MED ORDER — DIPHENHYDRAMINE HCL 50 MG/ML IJ SOLN
25.0000 mg | INTRAMUSCULAR | Status: AC
  Administered 2020-04-29: 25 mg via INTRAVENOUS
  Filled 2020-04-29: qty 1

## 2020-04-29 MED ORDER — HYDRALAZINE HCL 20 MG/ML IJ SOLN: 5.0000 mg | INTRAMUSCULAR | Status: DC | PRN

## 2020-04-29 MED ORDER — IODIXANOL 320 MG/ML IV SOLN
INTRAVENOUS | Status: DC | PRN
  Administered 2020-04-29: 104 mL via INTRA_ARTERIAL

## 2020-04-29 MED ORDER — HEPARIN (PORCINE) IN NACL 1000-0.9 UT/500ML-% IV SOLN
INTRAVENOUS | Status: DC | PRN
  Administered 2020-04-29 (×2): 500 mL

## 2020-04-29 MED ORDER — SODIUM CHLORIDE 0.9% FLUSH: 3.0000 mL | Freq: Two times a day (BID) | INTRAVENOUS | Status: DC

## 2020-04-29 MED ORDER — HEPARIN SODIUM (PORCINE) 5000 UNIT/ML IJ SOLN
5000.0000 [IU] | Freq: Three times a day (TID) | INTRAMUSCULAR | Status: DC
  Administered 2020-04-29 – 2020-05-01 (×5): 5000 [IU] via SUBCUTANEOUS
  Filled 2020-04-29 (×5): qty 1

## 2020-04-29 MED ORDER — FENTANYL CITRATE (PF) 100 MCG/2ML IJ SOLN
INTRAMUSCULAR | Status: AC
  Filled 2020-04-29: qty 2

## 2020-04-29 MED ORDER — SODIUM CHLORIDE 0.9% FLUSH
3.0000 mL | Freq: Two times a day (BID) | INTRAVENOUS | Status: DC
  Administered 2020-04-29 – 2020-05-04 (×3): 3 mL via INTRAVENOUS

## 2020-04-29 MED ORDER — OXYCODONE HCL 5 MG PO TABS
5.0000 mg | ORAL_TABLET | Freq: Once | ORAL | Status: AC
  Administered 2020-04-29: 5 mg via ORAL
  Filled 2020-04-29: qty 1

## 2020-04-29 MED ORDER — SODIUM CHLORIDE 0.9% FLUSH: 3.0000 mL | INTRAVENOUS | Status: DC | PRN

## 2020-04-29 MED ORDER — MIDAZOLAM HCL 2 MG/2ML IJ SOLN
INTRAMUSCULAR | Status: AC
  Filled 2020-04-29: qty 2

## 2020-04-29 MED ORDER — MIDAZOLAM HCL 2 MG/2ML IJ SOLN
INTRAMUSCULAR | Status: DC | PRN
  Administered 2020-04-29: 1 mg via INTRAVENOUS

## 2020-04-29 MED ORDER — FENTANYL CITRATE (PF) 100 MCG/2ML IJ SOLN
INTRAMUSCULAR | Status: DC | PRN
  Administered 2020-04-29: 50 ug via INTRAVENOUS

## 2020-04-29 MED ORDER — LIDOCAINE HCL (PF) 1 % IJ SOLN
INTRAMUSCULAR | Status: DC | PRN
  Administered 2020-04-29: 15 mL via INTRADERMAL

## 2020-04-29 MED ORDER — METHYLPREDNISOLONE SODIUM SUCC 125 MG IJ SOLR
125.0000 mg | INTRAMUSCULAR | Status: AC
  Administered 2020-04-29: 125 mg via INTRAVENOUS
  Filled 2020-04-29: qty 2

## 2020-04-29 SURGICAL SUPPLY — 19 items
CATH ANGIO 5F PIGTAIL 65CM (CATHETERS) ×1 IMPLANT
CATH CROSS OVER TEMPO 5F (CATHETERS) ×1 IMPLANT
CATH SOFT-VU 4F 65 STRAIGHT (CATHETERS) IMPLANT
CATH SOFT-VU STRAIGHT 4F 65CM (CATHETERS) ×2
DEVICE TORQUE .025-.038 (MISCELLANEOUS) ×1 IMPLANT
GUIDEWIRE ANGLED .035X150CM (WIRE) ×1 IMPLANT
HOVERMATT SINGLE USE (MISCELLANEOUS) ×1 IMPLANT
KIT MICROPUNCTURE NIT STIFF (SHEATH) ×1 IMPLANT
KIT PV (KITS) ×2 IMPLANT
PROTECTION STATION PRESSURIZED (MISCELLANEOUS) ×2
SHEATH PINNACLE 5F 10CM (SHEATH) ×1 IMPLANT
STATION PROTECTION PRESSURIZED (MISCELLANEOUS) IMPLANT
STOPCOCK MORSE 400PSI 3WAY (MISCELLANEOUS) ×1 IMPLANT
SYR MEDRAD MARK 7 150ML (SYRINGE) ×1 IMPLANT
TRANSDUCER W/STOPCOCK (MISCELLANEOUS) ×2 IMPLANT
TRAY PV CATH (CUSTOM PROCEDURE TRAY) ×2 IMPLANT
TUBE CONN 8.8X1320 FR HP M-F (CONNECTOR) ×1 IMPLANT
TUBING CIL FLEX 10 FLL-RA (TUBING) ×1 IMPLANT
WIRE J 3MM .035X145CM (WIRE) ×1 IMPLANT

## 2020-04-29 NOTE — Progress Notes (Signed)
Family Medicine Teaching Service Daily Progress Note Intern Pager: [phone]  Patient name: Jonathon Bailey Medical record number: [id number] Date of birth: 17-May-1956 Age: 64 y.o. Gender: male  Primary Care Provider: [facility] Consultants: Vascular, Ortho  Code Status: Full code   Pt Overview and Major Events to Date:  Admitted  8/5   Assessment and Plan: Jonathon Bailey is a 64 y.o. male presenting with wet gangrene of left toes. PMH is significant for CAD s/p 5 stents, CVA x2, MI x3, T2DM insulin dependent, ESRD , macular edema, HLD, HTN, HFrEF.  Wet gangrene of left toes Patient presented to ED with wet gangrene of the toes on the left foot.  Foot is cold, mottled, second toe is necrotic.  There is increased concern for poor vascularization due to patient's significant history of CAD.  Vascular surgery did ABI and arterial duplex of left lower extremity are pending.  Vascular surgery noted decreased arterial perfusion in the left lower extremity.  This is likely contributing to poor healing. Patient is at increased risk for worsening infection/bacteremia due to his diagnosis of T2DM,insulin dependent. Patient was febrile overnight with a temperature of 101, has since been afebrile.  Patient is asymptomatic for systemic infection.  At presentation WBC 11.4, Hgb 8.4, PLT 366.Na 133, K+ 4.6.  CR 10.73. Wound likely result of multifactorial medical problems including T2DM with superimposed PAD due to patient's extensive vasculopathy. Patient not meeting sepsis criteria but with vasculopathy and increased risk of infection due to medical history, patient requires admission for vascular surgery evaluation.   - Per Dr. Donnetta Hutching, based on the abdominal aortogram and leg arteriogram he has recommended a primary left below-knee amputation and that bypass attempts would be futile.  - EKG:  Sinus rhythm with Premature supraventricular complexes and with occasional Premature ventricular  complexes. Right bundle branch block Qtc 498 ms -  Zosyn and vancomycin - F/U CMP  - F/U CBC  ESRD At presentation, K+ 4.6. Cr 10.73. Patient reports to HD per TThS schedule and missed session on day of admission due to waiting in ED. Patient reports being on HD for 6 years. Home medications include renvela, dialyvite. - Per nephrology: home ESRD medications. Ca at goal. Continue VDRA, sensipar and binders. BP well controlled.  Does not appear grossly volume overloaded on exam, plan for UF to dry weight.  Hb: 8. Aranesp given yesterday for anemia . No iv iron due to allergy  - elevated phosphorours 5.9. Continue VDRA, sensipar and binders  - WBC 11.6, RBC 3, HCT 26.2, RDW 16.8 - HD per outpatient schedule  - GFR 8, Cr 7.83, BUN 31  Constipation Patient describes leg pain that is shooting from the foot up to the hip. Reports that he only experiences pain when he is ambulating. Patient last took oxycodone on Monday, 8/2.  - Start MiraLAX  T2DM Glucose at presentation 128.  Patient has a history of T2DM and is insulin-dependent.  Patient reports Levemir 75 U twice daily.  Humalog 100 U 3 times daily. Patient states he has not taken insulin in 2-3 days as his BG was not elevated.  - Sensitive sliding scale - We will hold home medications at this time - F/U HgbA1c   HTN BP at admission 103/57-148/90.  HR 51-95.  Patient home medication includes hydralazine 50mg  3 times daily.  Given lower blood pressures in ED, will hold home hydralazine. Plan to continue home Coreg once able to verify patient's home dose as he is  prescribed 37.5mg  daily.  - Hold home hydralazine  - f/u on Coreg home dose   HLD Patient home medication is atorvastatin 40mg . - continue home atorvastatin  HFrEF  Patient reported as taking Coreg 6.25mg  BID. - Continue Coreg 25  Esophageal dysphagia Patient reports he has a history of food "getting stuck in his throat" and has been evaluated by the [place].  Patient  reports he was found to have structural abnormality but eats at baseline.  Patient is also post 2 CVAs. - We will continue to monitor  CAD: Hx CVA x2, MI x3, CABG Patient is reported to take ASA 325 every other day. Will hold this prior to procedure with vascular tomorrow.  -Transfusion threshold <8  FEN/GI: Renal carb modified. NPO @ [facility] Prophylaxis: Heparin  Disposition: MedSurg  Subjective:   Assessed patient when he returned from the cath lab. He endorses just feeling down about his situation. He had no questions or concerns for me  Objective: Temp:  [98.2 F (36.8 C)-100.8 F (38.2 C)] 98.3 F (36.8 C) (08/06 0655) Pulse Rate:  [74-100] 84 (08/06 0655) Resp:  [14-23] 16 (08/06 0655) BP: (93-166)/(51-85) 109/64 (08/06 0655) SpO2:  [94 %-100 %] 94 % (08/06 0655) Weight:  [833 kg] 124 kg (08/05 2300) Physical Exam: General: Alert laying in bed.  Cardiovascular: RRR, no murmurs heard Respiratory:CTA bilaterally. Normal work of breathing.  Abdomen: soft, non-distended Extremities: dry gangrene of left distal second toe, Cellulitis of left forefoot with erythema extending from foot to mid calf. L foot cool and there is no palpable L distal pulse   Laboratory: Recent Labs  Lab 04/27/20 2137 04/28/20 1802 04/29/20 0428  WBC 11.4* 12.0* 11.6*  HGB 8.4* 9.1* 8.0*  HCT 28.4* 30.2* 26.2*  PLT 366 387 377   Recent Labs  Lab 04/27/20 2137 04/29/20 0428  NA 133* 137  K 4.6 4.7  CL 91* 96*  CO2 25 27  BUN 48* 31*  CREATININE 10.73* 7.83*  CALCIUM 9.2 9.3  PROT  --  7.8  BILITOT  --  0.6  ALKPHOS  --  73  ALT  --  26  AST  --  24  GLUCOSE 148* 152*     Imaging/Diagnostic Tests:  AORTA/RENALS: There are single renal arteries bilaterally with no significant renal artery stenosis identified.  The infrarenal aorta is widely patent.  INFLOW: The common iliac, external iliac, and hypogastric arteries are patent.  RIGHT LOWER EXTREMITY RUNOFF: On the right side,  the common femoral, deep femoral, and superficial femoral artery are patent.  There is moderate disease of the below-knee popliteal artery.  Below that there is severe tibial artery occlusive disease.  The anterior tibial and posterior tibial arteries are occluded.  There is severe disease in the proximal peroneal artery and then the peroneal artery is patent and collateralizes the distal posterior tibial artery.  The dorsalis pedis artery is occluded.  LEFT LOWER EXTREMITY RUNOFF: On the left side, which is the side of concern, the common femoral, deep femoral, and superficial femoral arteries are patent.  The popliteal artery occludes at the level of the knee. There are extensive collaterals suggesting that this is chronic.  The below-knee popliteal artery, anterior tibial, tibial peroneal trunk, peroneal, and posterior tibial arteries are occluded.  There is reconstitution of the peroneal artery in the proximal calf and then there is disease in the peroneal distally.  There is collateralization of a small posterior tibial artery distally which has moderate disease.  Theone Stanley, [place] 04/29/2020, 7:51 AM PGY-1, [place] Intern pager: [phone], text pages welcome

## 2020-04-29 NOTE — Progress Notes (Signed)
Patient ID: Jonathon Bailey, male   DOB: [birth year], 64 y.o.   MRN: [id number]  Progress Note    04/29/2020 2:19 PM Day of Surgery  Subjective: Minimal discomfort in his left foot.  Stable following arteriogram   Vitals:   04/29/20 1014 04/29/20 1208  BP: 110/66 93/65  Pulse: 79 78  Resp:  16  Temp:  98.6 F (37 C)  SpO2:  98%   Physical Exam: Progressive dry gangrenous changes in his left foot since yesterday.  Areas of skin slough now with a dry gangrene the distal great and third toe and dry gangrene of the entire left second toe.  Also with full-thickness skin loss onto the dorsum of his foot with epidermal lysis past the first metatarsal head.  CBC    Component Value Date/Time   WBC 12.6 (H) 04/29/2020 1016   RBC 3.22 (L) 04/29/2020 1016   HGB 8.4 (L) 04/29/2020 1016   HCT 28.2 (L) 04/29/2020 1016   PLT 361 04/29/2020 1016   MCV 87.6 04/29/2020 1016   MCH 26.1 04/29/2020 1016   MCHC 29.8 (L) 04/29/2020 1016   RDW 17.0 (H) 04/29/2020 1016   LYMPHSABS 1.3 09/04/2014 0905   MONOABS 1.3 (H) 09/04/2014 0905   EOSABS 0.4 09/04/2014 0905   BASOSABS 0.0 09/04/2014 0905    BMET    Component Value Date/Time   NA 137 04/29/2020 0428   K 4.7 04/29/2020 0428   CL 96 (L) 04/29/2020 0428   CO2 27 04/29/2020 0428   GLUCOSE 152 (H) 04/29/2020 0428   BUN 31 (H) 04/29/2020 0428   CREATININE 7.83 (H) 04/29/2020 0428   CREATININE 7.17 (H) 10/23/2012 1554   CALCIUM 9.3 04/29/2020 0428   GFRNONAA 7 (L) 04/29/2020 0428   GFRAA 8 (L) 04/29/2020 0428    INR    Component Value Date/Time   INR 0.92 03/06/2013 1054     Intake/Output Summary (Last 24 hours) at 04/29/2020 1419 Last data filed at 04/29/2020 0314 Gross per 24 hour  Intake 50 ml  Output 3000 ml  Net -2950 ml     Assessment/Plan:  64 y.o. male I reviewed his arteriogram and discussed this at great length with the patient.  There are 2 major problems. 1. The tissue loss in his left foot is extensive.  Even with  normal arterial flow, he would be very borderline at healing a transmetatarsal amputation.  This would be a high transmetatarsal amputation. 2.  He has very unfavorable anatomy for revascularization attempts.  He occludes his complete popliteal artery,  anterior tibial artery, tibioperoneal trunk, peroneal artery and posterior tibial artery.  He does have a reconstitution of proximal calf peroneal artery but has several areas of subtotal occlusion above the ankle with very poor collateralization into the foot.  He has a diseased reconstitution posterior tibial at the ankle that does not go into the foot.  His only revascularization option would be bypass from his above-knee popliteal to peroneal artery with a distal occlusion essentially giving him a blind peroneal artery bypass.  I explained that this would have very little chance for providing adequate flow to heal a transmetatarsal amputation.  Also explained that if this failed, would put below-knee amputation healing at risk.  Also has had vein harvested from his left leg so would require vein harvest from the right leg if he has adequate vein.  This would potentially put his right leg at risk since he has similar popliteal and distal disease on the right.  I have recommended a primary left below-knee amputation.  He is understandably very upset at this recommendation.  I explained at length my reason for recommending primary amputation.  He is asking about a second opinion.  I explained that I have reviewed his films with Dr. Scot Dock along his arteriogram and that we both are in agreement that bypass attempts would be futile.  I explained that Dr. Trula Slade is covering for [place] this weekend and I will ask him to review his films as well to see if he has any other thoughts.  I explained that this is not emergent.  He is having minimal pain.  His white count is somewhat elevated but does not have any evidence of wet gangrene or progression.  I explained that  we would offer amputation on Monday if he agrees this is the best approach.  I explained the expected postoperative course of physical therapy and possible inpatient rehab.  Explained that he would require minimum of 3 months for healing prior to being fitted with prosthesis.  He will continue to consider his options and Dr. Trula Slade will see him on the weekend.     Rosetta Posner, MD FACS [facility] [phone] 04/29/2020 2:19 PM

## 2020-04-29 NOTE — Op Note (Signed)
PATIENT: Jonathon Bailey      MRN: [id number] DOB: 1956-02-11    DATE OF PROCEDURE: 04/29/2020  INDICATIONS:    JAAN FISCHEL is a 64 y.o. male who presented with critical limb ischemia.  He presents for arteriography.  PROCEDURE:    1.  Conscious sedation 2.  Ultrasound-guided access to the right common femoral artery 3.  Aortogram with bilateral iliac arteriogram 4.  Selective catheterization of the left external iliac artery (second order catheterization) with left lower extremity runoff 5.  Retrograde right femoral arteriogram with right lower extremity runoff  SURGEON: Judeth Cornfield. Scot Dock, MD, FACS  ANESTHESIA: Local with sedation  EBL: Minimal  TECHNIQUE: The patient was brought to the peripheral vascular lab and was sedated. The period of conscious sedation was 39 minutes.  During that time period, I was present face-to-face 100% of the time.  The patient was administered 1 mg of Versed and 50 mcg of fentanyl. The patient's heart rate, blood pressure, and oxygen saturation were monitored by the nurse continuously during the procedure.  Both groins were prepped and draped in the usual sterile fashion.  Under ultrasound guidance, after the skin was anesthetized, I cannulated the right common femoral artery with a micropuncture needle and a micropuncture sheath was introduced over a wire.  This was exchanged for a 5 [place] sheath over a Bentson wire.  By ultrasound the femoral artery was patent.  There was significant calcium in the artery.  A real-time image was obtained and sent to the server.  A pigtail catheter was positioned at the L1 vertebral body and flush aortogram obtained.  The catheter was in position above the aortic bifurcation and an oblique iliac projection was obtained.  I then exchanged the pigtail catheter for a crossover catheter which was positioned into the left common iliac artery.  I advanced an angled Glidewire into the external iliac artery and  exchanged the crossover catheter for a straight catheter.  Selective left external iliac arteriogram was obtained with left lower extremity runoff I then remove the catheter and shot a retrograde right femoral arteriogram with right lower extremity runoff.  At the completion of the procedure the patient was sent to the holding area for removal of the sheath.  No immediate complications were noted.  FINDINGS:   AORTA/RENALS: There are single renal arteries bilaterally with no significant renal artery stenosis identified.  The infrarenal aorta is widely patent.  INFLOW: The common iliac, external iliac, and hypogastric arteries are patent.  RIGHT LOWER EXTREMITY RUNOFF: On the right side, the common femoral, deep femoral, and superficial femoral artery are patent.  There is moderate disease of the below-knee popliteal artery.  Below that there is severe tibial artery occlusive disease.  The anterior tibial and posterior tibial arteries are occluded.  There is severe disease in the proximal peroneal artery and then the peroneal artery is patent and collateralizes the distal posterior tibial artery.  The dorsalis pedis artery is occluded.  LEFT LOWER EXTREMITY RUNOFF: On the left side, which is the side of concern, the common femoral, deep femoral, and superficial femoral arteries are patent.  The popliteal artery occludes at the level of the knee.  There are extensive collaterals suggesting that this is chronic.  The below-knee popliteal artery, anterior tibial, tibial peroneal trunk, peroneal, and posterior tibial arteries are occluded.  There is reconstitution of the peroneal artery in the proximal calf and then there is disease in the peroneal distally.  There is collateralization of  a small posterior tibial artery distally which has moderate disease.  [name], MD, FACS [facility]  DATE OF DICTATION:   04/29/2020

## 2020-04-29 NOTE — Progress Notes (Signed)
The chaplain visited with the patient as a result of a consult. The patient showed a strong use of spirituality for coping with his diagnosis. The patient wants time to pray and talk to God before making a decision. The patient does believe that God will perform a miracle on his behalf, but also is willing to accept if God does not. The chaplain offered prayer for the patient as well as a supportive presence. The chaplain is available if the patient needs further support.  Brion Aliment Chaplain Resident For questions concerning this note please contact me by pager [phone]

## 2020-04-29 NOTE — Interval H&P Note (Signed)
History and Physical Interval Note:  04/29/2020 7:39 AM  Jonathon Bailey  has presented today for surgery, with the diagnosis of PAD.  The various methods of treatment have been discussed with the patient and family. After consideration of risks, benefits and other options for treatment, the patient has consented to  Procedure(s): ABDOMINAL AORTOGRAM W/LOWER EXTREMITY (N/A) as a surgical intervention.  The patient's history has been reviewed, patient examined, no change in status, stable for surgery.  I have reviewed the patient's chart and labs.  Questions were answered to the patient's satisfaction.     [name]

## 2020-04-29 NOTE — Progress Notes (Signed)
Site area: rt groin fa sheath Site Prior to Removal:  Level 0 Pressure Applied For: 20 minutes Manual:   yes Patient Status During Pull:  stable Post Pull Site:  Level 0 Post Pull Instructions Given:  yes Post Pull Pulses Present: rt pt dopplered Dressing Applied:  Gauze and tegaderm Bedrest begins @ 0920 Comments: IV saline locked

## 2020-04-29 NOTE — Progress Notes (Addendum)
[facility] Progress Note   Subjective:   Patient seen and examined at bedside.  Reports dialysis went well.  No new complaints.  Waiting to go for aortogram.  Denies CP, SOB, fever, chills, n/v/d, abdominal pain, weakness, dizziness and fatigue.    Objective Vitals:   04/28/20 2220 04/28/20 2249 04/28/20 2300 04/29/20 0249  BP: 125/65 138/71  132/69  Pulse: 93 100  94  Resp: 17 16  16   Temp: (!) 100.8 F (38.2 C) 100 F (37.8 C)  98.9 F (37.2 C)  TempSrc: Oral Oral    SpO2: 96% 99%  100%  Weight:   124 kg   Height:   5\' 11"  (1.803 m)    Physical Exam General:WDWN obese male in NAD Heart:RRR Lungs:CTAB Abdomen:soft, NTND Extremities:trace b/l edema, L foot cold, skin sloughing, necrotic 2nd toe with darkened skin on toes/forefoot/ankle Dialysis Access: [facility] dressed   Filed Weights   04/28/20 1941 04/28/20 2300  Weight: 124 kg 124 kg    Intake/Output Summary (Last 24 hours) at 04/29/2020 0807 Last data filed at 04/29/2020 0314 Gross per 24 hour  Intake 50 ml  Output 3000 ml  Net -2950 ml    Additional Objective Labs: Basic Metabolic Panel: Recent Labs  Lab 04/27/20 2137 04/29/20 0428  NA 133* 137  K 4.6 4.7  CL 91* 96*  CO2 25 27  GLUCOSE 148* 152*  BUN 48* 31*  CREATININE 10.73* 7.83*  CALCIUM 9.2 9.3  PHOS  --  5.9*   Liver Function Tests: Recent Labs  Lab 04/29/20 0428  AST 24  ALT 26  ALKPHOS 73  BILITOT 0.6  PROT 7.8  ALBUMIN 2.5*   CBC: Recent Labs  Lab 04/27/20 2137 04/28/20 1802 04/29/20 0428  WBC 11.4* 12.0* 11.6*  HGB 8.4* 9.1* 8.0*  HCT 28.4* 30.2* 26.2*  MCV 89.6 87.0 87.3  PLT 366 387 377   CBG: Recent Labs  Lab 04/28/20 1506 04/29/20 0655  GLUCAP 128* 118*   Studies/Results: DG Toe 2nd Left  Result Date: 04/27/2020 CLINICAL DATA:  Pain EXAM: LEFT SECOND TOE COMPARISON:  None. FINDINGS: There is no evidence of fracture or dislocation. There is no evidence of arthropathy or other focal bone abnormality.  Soft tissues are unremarkable. Vascular calcifications are noted. IMPRESSION: Negative. Electronically Signed   By: Constance Holster M.D.   On: 04/27/2020 22:05    Medications: . sodium chloride    . [MAR Hold] piperacillin-tazobactam (ZOSYN)  IV 2.25 g (04/29/20 0627)  . [MAR Hold] vancomycin     . [MAR Hold] atorvastatin  40 mg Oral QHS  . [MAR Hold] carvedilol  6.25 mg Oral BID WC  . [MAR Hold] Chlorhexidine Gluconate Cloth  6 each Topical Q0600  . [MAR Hold] cinacalcet  90 mg Oral Q supper  . [MAR Hold] darbepoetin (ARANESP) injection - DIALYSIS  40 mcg Intravenous Q Thu-HD  . [MAR Hold] doxercalciferol  6 mcg Intravenous Q T,Th,Sa-HD  . [MAR Hold] heparin  5,000 Units Subcutaneous Q8H  . [MAR Hold] insulin aspart  0-9 Units Subcutaneous TID WC  . [MAR Hold] multivitamin  1 tablet Oral QHS  . [MAR Hold] polyethylene glycol  17 g Oral Daily  . [MAR Hold] sevelamer carbonate  4.8 g Oral TID WC  . sodium chloride flush  3 mL Intravenous Q12H  . [MAR Hold] umeclidinium bromide  1 puff Inhalation Daily    Dialysis Orders: [facility]  4.5hrs, BFR 400, DFR 800,  EDW 126kg, 2K/  2.25Ca  Access: TDC  Heparin 12000 Mircera 30 mcg q2wks - just restarted, last 10/20/19 Hectorol 6 mcg IV qHD    Assessment/Plan: 1.  ischemic L foot/cellulitis -  Xray shows no signs of osteomyelitis.  VVS consulting, plan for aortogram, arteriogram and possible endovascular intervention today.  BC ordered.  Vanc started.  Per primary/VVS 2.  ESRD -  On HD TTS.  HS yesterday tolerated well. Next HD 8/7, orders written. K 4.7 3.  Hypertension/volume  - BP well controlled.  Does not appear grossly volume overloaded on exam.  UF as tolerated.  Net UF 3L removed yesterday. Need standing weights when possible to better assess edw. 4.  Anemia of CKD - Hgb 8.0 today.  Aranesp 46mcg given with HD yesterday. No IV iron due to allergy.  5.  Secondary Hyperparathyroidism -  Ca at goal.  Phos elevated. Continue VDRA,  sensipar and binders.  6.  Nutrition - Renal diet w/fluid restrictions.  7. DMT2 - per primary 8. CAD s/p CABG - per primary 9. Hx CVA 10. COPD 44. OSA - on CPAP  Jen Mow, PA-C [facility] 04/29/2020,8:07 AM  LOS: 1 day   I have seen and examined this patient and agree with plan and assessment in the above note with renal recommendations/intervention highlighted.  Has been having pain in his left foot for the past month and [facility] ED.  Now with gangrenous changes to his toes and ischemic cellulitis.  Awaiting decision regarding arteriogram and possible revascularization from Dr. Scot Dock. Broadus John A Valissa Lyvers,MD 04/29/2020 12:18 PM

## 2020-04-30 ENCOUNTER — Encounter (HOSPITAL_COMMUNITY): Payer: No Typology Code available for payment source

## 2020-04-30 DIAGNOSIS — I998 Other disorder of circulatory system: Secondary | ICD-10-CM | POA: Diagnosis not present

## 2020-04-30 DIAGNOSIS — I2510 Atherosclerotic heart disease of native coronary artery without angina pectoris: Secondary | ICD-10-CM | POA: Diagnosis not present

## 2020-04-30 DIAGNOSIS — E118 Type 2 diabetes mellitus with unspecified complications: Secondary | ICD-10-CM | POA: Diagnosis not present

## 2020-04-30 DIAGNOSIS — N186 End stage renal disease: Secondary | ICD-10-CM | POA: Diagnosis not present

## 2020-04-30 LAB — HEPATITIS B SURFACE ANTIGEN: Hepatitis B Surface Ag: NONREACTIVE

## 2020-04-30 LAB — COMPREHENSIVE METABOLIC PANEL
ALT: 26 U/L (ref 0–44)
AST: 15 U/L (ref 15–41)
Albumin: 2.3 g/dL — ABNORMAL LOW (ref 3.5–5.0)
Alkaline Phosphatase: 68 U/L (ref 38–126)
Anion gap: 17 — ABNORMAL HIGH (ref 5–15)
BUN: 52 mg/dL — ABNORMAL HIGH (ref 8–23)
CO2: 25 mmol/L (ref 22–32)
Calcium: 9 mg/dL (ref 8.9–10.3)
Chloride: 90 mmol/L — ABNORMAL LOW (ref 98–111)
Creatinine, Ser: 9.78 mg/dL — ABNORMAL HIGH (ref 0.61–1.24)
GFR calc Af Amer: 6 mL/min — ABNORMAL LOW (ref >60)
GFR calc non Af Amer: 5 mL/min — ABNORMAL LOW (ref >60)
Glucose, Bld: 288 mg/dL — ABNORMAL HIGH (ref 70–99)
Potassium: 5.6 mmol/L — ABNORMAL HIGH (ref 3.5–5.1)
Sodium: 132 mmol/L — ABNORMAL LOW (ref 135–145)
Total Bilirubin: 0.6 mg/dL (ref 0.3–1.2)
Total Protein: 7.8 g/dL (ref 6.5–8.1)

## 2020-04-30 LAB — GLUCOSE, CAPILLARY
Glucose-Capillary: 169 mg/dL — ABNORMAL HIGH (ref 70–99)
Glucose-Capillary: 231 mg/dL — ABNORMAL HIGH (ref 70–99)
Glucose-Capillary: 149 mg/dL — ABNORMAL HIGH (ref 70–99)

## 2020-04-30 LAB — CBC
HCT: 25 % — ABNORMAL LOW (ref 39.0–52.0)
Hemoglobin: 7.5 g/dL — ABNORMAL LOW (ref 13.0–17.0)
MCH: 26 pg (ref 26.0–34.0)
MCHC: 30 g/dL (ref 30.0–36.0)
MCV: 86.5 fL (ref 80.0–100.0)
Platelets: 350 10*3/uL (ref 150–400)
RBC: 2.89 MIL/uL — ABNORMAL LOW (ref 4.22–5.81)
RDW: 16.9 % — ABNORMAL HIGH (ref 11.5–15.5)
WBC: 16.2 10*3/uL — ABNORMAL HIGH (ref 4.0–10.5)
nRBC: 0 % (ref 0.0–0.2)

## 2020-04-30 LAB — PREPARE RBC (CROSSMATCH)

## 2020-04-30 LAB — HEMOGLOBIN AND HEMATOCRIT, BLOOD
HCT: 27.9 % — ABNORMAL LOW (ref 39.0–52.0)
Hemoglobin: 8.7 g/dL — ABNORMAL LOW (ref 13.0–17.0)

## 2020-04-30 MED ORDER — SODIUM CHLORIDE 0.9% IV SOLUTION: Freq: Once | INTRAVENOUS | Status: DC

## 2020-04-30 MED ORDER — DOXERCALCIFEROL 4 MCG/2ML IV SOLN
INTRAVENOUS | Status: AC
  Filled 2020-04-30: qty 4

## 2020-04-30 MED ORDER — HEPARIN SODIUM (PORCINE) 1000 UNIT/ML IJ SOLN
INTRAMUSCULAR | Status: AC
  Administered 2020-04-30: 1000 [IU]
  Filled 2020-04-30: qty 4

## 2020-04-30 MED ORDER — CARVEDILOL 6.25 MG PO TABS: 6.2500 mg | ORAL_TABLET | Freq: Two times a day (BID) | ORAL | Status: DC

## 2020-04-30 MED ORDER — HYDROMORPHONE HCL 1 MG/ML IJ SOLN
1.0000 mg | INTRAMUSCULAR | Status: DC | PRN
  Administered 2020-04-30: 1 mg via INTRAVENOUS
  Filled 2020-04-30: qty 1

## 2020-04-30 MED ORDER — VANCOMYCIN HCL IN DEXTROSE 1-5 GM/200ML-% IV SOLN
INTRAVENOUS | Status: AC
  Administered 2020-04-30: 1000 mg
  Filled 2020-04-30: qty 200

## 2020-04-30 MED ORDER — CARVEDILOL 6.25 MG PO TABS
6.2500 mg | ORAL_TABLET | Freq: Once | ORAL | Status: AC
  Administered 2020-04-30: 6.25 mg via ORAL
  Filled 2020-04-30: qty 1

## 2020-04-30 MED ORDER — INSULIN GLARGINE 100 UNIT/ML ~~LOC~~ SOLN
15.0000 [IU] | Freq: Every day | SUBCUTANEOUS | Status: DC
  Administered 2020-04-30 – 2020-05-02 (×3): 15 [IU] via SUBCUTANEOUS
  Filled 2020-04-30 (×4): qty 0.15

## 2020-04-30 NOTE — Progress Notes (Signed)
Interim progress note   Received call from nurse that patient Jonathon Bailey was having some pain from his left foot/toe.  Per the nurse, the patient wanted to get up and walk around to ambulate on his leg "while he still had it".  Patient has a history of taking some oxycodone for occasional severe pains.  Due to patient's ESRD, will order Dilaudid 1 mg every 4 hours as needed.  Nurse is to report back to [place] if the Dilaudid is not helping this patient's pain and we can readjust his regimen.   Milus Banister, [place], PGY-3 04/30/2020 5:25 PM

## 2020-04-30 NOTE — Progress Notes (Signed)
[facility] Progress Note  Subjective: no c/o, seen on HD  Vitals:   04/30/20 0900 04/30/20 0910 04/30/20 0915 04/30/20 0920  BP: 92/76 115/66 111/74 103/67  Pulse: 66 81 81 66  Resp: 18 17 17 19   Temp:  98.4 F (36.9 C) 98.9 F (37.2 C) 98.1 F (36.7 C)  TempSrc:  Oral Oral Oral  SpO2:   96% 96%  Weight:      Height:        Exam: General:WDWN obese male in NAD Heart:RRR Lungs:CTAB Abdomen:soft, NTND Extremities:trace b/l edema, L foot cold, skin sloughing, necrotic 2nd toe with darkened skin on toes/forefoot/ankle Dialysis Access: TDC dressed     OP HD: [facility]  4.5h  400/800  126kg  2/2.25 bath  Hep 12000  TDC  Mircera26mcg q2wks - just restarted,last 10/20/19  Hectorol39mcg IV qHD   Assessment/ Plan: 1. Gangrene/ ischemia L foot w/ cellulitis-Xray w/o signs of osteomyelitis. VVS consulted and did bilat LE arteriogram yest 8/6.  Pt has severe bilat PAD below the knees.  Per VVS surg revasc unlikely to heal a L TMA. BKA recommended.   2. ESRD- HD TTS.  HD today 3. Hypertension/volume- BP well controlled. No vol ^ on exam. at dry wt.  4. Anemiaof CKD- Hgb 8.0 today. Aranesp 78mcg given with HD 8/5. No IV iron due to allergy.  5. Secondary Hyperparathyroidism -Ca at goal.  Phos elevated. Continue VDRA, sensiparand binders. 6. Nutrition- Renal diet w/fluid restrictions.  7. DMT2 -per primary 8. CAD s/p CABG - per primary 9. Hx CVA 10. COPD 41. OSA - on CPAP     Jonathon Bailey 04/30/2020, 9:40 AM   Recent Labs  Lab 04/29/20 0428 04/29/20 1016 04/29/20 1737 04/30/20 0022  K 4.7  --   --  5.6*  BUN 31*  --   --  52*  CREATININE 7.83*  --   --  9.78*  CALCIUM 9.3  --   --  9.0  PHOS 5.9*  --   --   --   HGB 8.0*   < > 8.4* 7.5*   < > = values in this interval not displayed.   Inpatient medications: . sodium chloride   Intravenous Once  . aspirin  81 mg Oral QODAY  . atorvastatin  40 mg Oral QHS  . carvedilol  6.25  mg Oral BID WC  . Chlorhexidine Gluconate Cloth  6 each Topical Q0600  . cinacalcet  90 mg Oral Q supper  . darbepoetin (ARANESP) injection - DIALYSIS  40 mcg Intravenous Q Thu-HD  . doxercalciferol  6 mcg Intravenous Q T,Th,Sa-HD  . heparin  5,000 Units Subcutaneous Q8H  . insulin aspart  0-9 Units Subcutaneous TID WC  . insulin glargine  10 Units Subcutaneous QHS  . multivitamin  1 tablet Oral QHS  . polyethylene glycol  17 g Oral Daily  . sevelamer carbonate  4.8 g Oral TID WC  . sodium chloride flush  3 mL Intravenous Q12H  . umeclidinium bromide  1 puff Inhalation Daily   . sodium chloride    . piperacillin-tazobactam (ZOSYN)  IV 2.25 g (04/30/20 0548)  . vancomycin     sodium chloride, acetaminophen **OR** acetaminophen, acetaminophen, benzonatate, hydrALAZINE, labetalol, ondansetron (ZOFRAN) IV, sodium chloride flush, tobramycin

## 2020-04-30 NOTE — Plan of Care (Signed)

## 2020-04-30 NOTE — Progress Notes (Signed)
Family Medicine Teaching Service Daily Progress Note Intern Pager: [phone]  Patient name: Jonathon Bailey Medical record number: [id number] Date of birth: 1956/05/12 Age: 64 y.o. Gender: male  Primary Care Provider: [facility] Consultants: Vascular, Ortho  Code Status: Full code   Pt Overview and Major Events to Date:  Admitted  8/5  Abdominal aortogram and leg arteriogram: 8/6  Assessment and Plan: Jonathon Bailey is a 64 y.o. male presenting with gangrene of left toes. PMH is significant for CAD s/p 5 stents, CVA x2, MI x3, T2DM insulin dependent, ESRD , macular edema, HLD, HTN, HFrEF.  Severe PAD with ischemic left foot  Left toe dry gangrene: Stable.  POD #2 arteriogram with VVS, poor candidate for revascularization due to extensive disease and tissue loss present.  Afebrile, leukocytosis downtrending. VVS recommending BKA (possibly 8/9 if agrees), patient continues to contemplate this recommendation. -Vascular surgery on board, continue to appreciate recommendations -Continue IV Zosyn q8 (8/5-) -Continue IV vancomycin with HD (8/5-) -Tylenol q6 scheduled for mild pain, oxycodone 5 mg every 6 as needed for moderate/severe -Monitor CBC -Monitor for worsening symptoms  Lightheadedness: Acute. In setting of recent transition to chair from bed, may be due to orthostasis and recently lower blood pressures.  Do also question occult GI bleed as minimal increase in hemoglobin s/p RBCs yesterday, however reassuringly no evidence of bleeding at this time.  No arrhythmia on exam, HR 80s and regular. His gangrene appears stable and on full broad-spectrum antibiotics already.  CBG 147. -Orthostatic vitals -Provided with ice chips and water, to order breakfast -FOBT -1 unit RBCs as discussed below -Monitor telemetry -Reduce dose of Coreg  Acute on chronic anemia of CKD: Unchanged. Hgb 7.7 this am, s/p 1U yesterday when Hgb 7.5.  No s/sx of bleeding per nursing or  patient. -1 unit RBCs this am, post H&H -Order ferritin/iron panel, however likely to be affected by unit yesterday -Monitor CBC -FOBT -ESA/iron per nephrology  ESRD: Chronic, stable. TThSat schedule. -Nephrology on board, appreciate assistance -Monitor electrolytes  Insulin-dependent T2DM: Chronic, stable. A1c 7.9.  Required 5U SSI in 24 hours. Takes Levemir 75 U BID w/ novolog 5 BID PRN.  -Continue Lantus 15 units -Hold home Levemir while receiving Lantus as above -Sensitive SSI -Monitor CBGs with meals and at bedtime  Constipation: Chronic, stable. Receiving opioids. -Continue MiraLax scheduled daily  Hypertension: Chronic, stable. SBP 88-110s. -Restart home Coreg at reduced dose, 3.125mg  BID from 6.25 -Continue to hold home hydralazine d/t lower pressure -Monitor BP  HFrEF: Chronic, stable.  EF 45% in 2013.  Likely in setting of above.  [facility] -Nephrology for HD/volume control -Continue CAD medications as above -Monitor fluid status  Esophageal dysphagia: Chronic, stable. Evaluated outpatient at [facility], structural abnormality present however does well with regular diet. -Monitor, maintain renal diet as is  CAD, s/p MI and CABG: Chronic, stable.  [facility].  No current anginal symptoms. -Continue home ASA 81 mg every other day -Continue home statin -Home Coreg as above  Previous CVA: Chronic, stable. No significant residual deficits.  -ASA and statin as above  Hyperlipidemia: Chronic, stable. -Continue home atorvastatin 40 mg  FEN/GI: Renal carb modified  Prophylaxis: Heparin  Disposition: Pending further possible vascular intervention  Subjective:  No acute events overnight with the exception of lower blood pressures.  This morning he reports he feels a little lightheaded, just got into the chair from his bed.  Last received Dilaudid yesterday evening 5 PM.  He denies  any associated headache, visual changes, loss of consciousness, or  tunnel vision.  Not nauseous or vomiting, no abdominal pain, melena, or hematochezia.  Has not had anything to drink or breakfast this morning.  Objective: Temp:  [98.1 F (36.7 C)-100.1 F (37.8 C)] 100.1 F (37.8 C) (08/07 2108) Pulse Rate:  [66-88] 83 (08/07 2108) Resp:  [17-21] 19 (08/07 2201) BP: (84-130)/(53-89) 102/56 (08/07 2201) SpO2:  [93 %-98 %] 98 % (08/07 2108) Weight:  [124.5 kg-126 kg] 124.5 kg (08/07 1222) Physical Exam: General: Alert, NAD, sitting up in chair HEENT: NCAT Cardiac: RRR Lungs: Clear bilaterally, no increased WOB on room air Abdomen: soft, non-tender Msk: Moves all extremities spontaneously  Ext: Blackened second and third left toes, mild skin abrasion on dorsal portion of left foot.  Left foot slightly cool to touch, left lower leg warm and dry.  Sensation to light touch intact throughout all distal toes bilaterally, however diminished.  Unable to palpate left dorsalis pedis pulse.  Updated picture from today as below:       Laboratory: Recent Labs  Lab 04/29/20 1016 04/29/20 1016 04/29/20 1737 04/30/20 0022 04/30/20 2014  WBC 12.6*  --  13.0* 16.2*  --   HGB 8.4*   < > 8.4* 7.5* 8.7*  HCT 28.2*   < > 27.9* 25.0* 27.9*  PLT 361  --  351 350  --    < > = values in this interval not displayed.   Recent Labs  Lab 04/27/20 2137 04/29/20 0428 04/30/20 0022  NA 133* 137 132*  K 4.6 4.7 5.6*  CL 91* 96* 90*  CO2 25 27 25   BUN 48* 31* 52*  CREATININE 10.73* 7.83* 9.78*  CALCIUM 9.2 9.3 9.0  PROT  --  7.8 7.8  BILITOT  --  0.6 0.6  ALKPHOS  --  73 68  ALT  --  26 26  AST  --  24 15  GLUCOSE 148* 152* 288*     Imaging/Diagnostic Tests:  AORTA/RENALS: There are single renal arteries bilaterally with no significant renal artery stenosis identified.  The infrarenal aorta is widely patent.  INFLOW: The common iliac, external iliac, and hypogastric arteries are patent.  RIGHT LOWER EXTREMITY RUNOFF: On the right side, the common  femoral, deep femoral, and superficial femoral artery are patent.  There is moderate disease of the below-knee popliteal artery.  Below that there is severe tibial artery occlusive disease.  The anterior tibial and posterior tibial arteries are occluded.  There is severe disease in the proximal peroneal artery and then the peroneal artery is patent and collateralizes the distal posterior tibial artery.  The dorsalis pedis artery is occluded.  LEFT LOWER EXTREMITY RUNOFF: On the left side, which is the side of concern, the common femoral, deep femoral, and superficial femoral arteries are patent.  The popliteal artery occludes at the level of the knee. There are extensive collaterals suggesting that this is chronic.  The below-knee popliteal artery, anterior tibial, tibial peroneal trunk, peroneal, and posterior tibial arteries are occluded.  There is reconstitution of the peroneal artery in the proximal calf and then there is disease in the peroneal distally.  There is collateralization of a small posterior tibial artery distally which has moderate disease.   Patriciaann Clan, DO 04/30/2020, 10:05 PM PGY-3, [place] Intern pager: [phone], text pages welcome

## 2020-04-30 NOTE — Progress Notes (Addendum)
Family Medicine Teaching Service Daily Progress Note Intern Pager: [phone]  Patient name: Jonathon Bailey Medical record number: [id number] Date of birth: 11-26-1955 Age: 64 y.o. Gender: male  Primary Care Provider: [facility] Consultants: Vascular, Ortho  Code Status: Full code   Pt Overview and Major Events to Date:  Admitted  8/5  Abdominal aortogram and leg arteriogram: 8/6  Assessment and Plan: Jonathon Bailey is a 64 y.o. male presenting with gangrene of left toes. PMH is significant for CAD s/p 5 stents, CVA x2, MI x3, T2DM insulin dependent, ESRD , macular edema, HLD, HTN, HFrEF.  Dry gangrene of left toes Patient presented to ED with gangrene of the toes on the left foot.  Foot is cold, mottled, second toe is necrotic.  There is increased concern for poor vascularization due to patient's significant history of CAD.  Vascular surgery noted decreased arterial perfusion in the left lower extremity.  This is likely contributing to poor healing. Patient is at increased risk for worsening infection/bacteremia due to his diagnosis of T2DM,insulin dependent. Patient has been afebrile since 8/4.  Patient is asymptomatic for systemic infection.  At presentation WBC 11.4, Hgb 8.4, PLT 366.Na 133, K+ 4.6.  CR 10.73. Wound likely result of multifactorial medical problems including T2DM with superimposed PAD due to patient's extensive vasculopathy. Patient not meeting sepsis criteria but with vasculopathy and increased risk of infection due to medical history, patient requires admission for vascular surgery evaluation.   - Per Dr. Donnetta Hutching, based on the abdominal aortogram and leg arteriogram he has recommended a primary left below-knee amputation.  - EKG:  Sinus rhythm with Premature supraventricular complexes and with occasional Premature ventricular complexes. Right bundle branch block Qtc 498 ms - Con't Zosyn and vancomycin - F/U CMP  - F/U CBC- Wbc uptrending 16.2 from 13.0  yesterday and 11.4 on presentation.   - Hb 7.5- gave 1 unit of pRBC this am   ESRD Patient reports to HD per TThS schedule.Patient reports being on HD for 6 years. Home medications include renvela, dialyvite. - HD per outpatient schedule   T2DM Glucose at presentation 128. This morning CBG 288. Patient has a history of T2DM and is insulin-dependent.  Patient reports Levemir 75 U twice daily.  Humalog 100 U 3 times daily. Patient states he has not taken insulin in 2-3 days as his BG was not elevated.  - Sensitive sliding scale - F/U HgbA1c  - CBG high this am (288). Increased Lantus from 10 to 15 units at bedtime   Constipation Patient describes leg pain that is shooting from the foot up to the hip. Reports that he only experiences pain when he is ambulating. Patient last took oxycodone on Monday, 8/2.  - Con't MiraLAX  HTN BP at admission 103/57-148/90.  HR 51-95.  Patient home medication includes hydralazine 50mg  3 times daily.  Given lower blood pressures in ED, will hold home hydralazine. Plan to continue home Coreg once able to verify patient's home dose as he is prescribed 37.5mg  daily.  - Cont hydralazine  - Con't Coreg    HLD Patient home medication is atorvastatin 40mg . - continue home atorvastatin  HFrEF  Patient reported as taking Coreg 6.25mg  BID. - Continue Coreg 25  Esophageal dysphagia Patient reports he has a history of food "getting Bailey in his throat" and has been evaluated by the [place].  Patient reports he was found to have structural abnormality but eats at baseline.  Patient is also post 2 CVAs. -  We will continue to monitor  CAD: Hx CVA x2, MI x3, CABG Patient is reported to take ASA 325 every other day. Will hold this prior to procedure with vascular tomorrow.  -Transfusion threshold <8  FEN/GI: Renal carb modified. NPO @ [facility] Prophylaxis: Heparin  Disposition: MedSurg  Subjective:   No acute events overnight. He was joking with the staff and  appeared to be in good spirits. Patient just got back from dialysis and had no complaints.   Objective: Temp:  [98.4 F (36.9 C)-99.7 F (37.6 C)] 98.4 F (36.9 C) (08/07 0429) Pulse Rate:  [0-92] 72 (08/07 0429) Resp:  [0-88] 20 (08/07 0429) BP: (93-133)/(56-79) 101/67 (08/07 0429) SpO2:  [0 %-98 %] 96 % (08/07 0429) Physical Exam: General: Sitting in chair preparing for lunch. Not in distress  Cardiovascular: RRR, no murmurs heard Respiratory:CTA bilaterally. Normal work of breathing.  Abdomen: soft, non-distended Extremities: dry gangrene of left distal second toe, skin peeling on top of left foot with erythema extending from foot to mid calf. L foot cool and there is no palpable L distal pulse   Laboratory: Recent Labs  Lab 04/29/20 1016 04/29/20 1737 04/30/20 0022  WBC 12.6* 13.0* 16.2*  HGB 8.4* 8.4* 7.5*  HCT 28.2* 27.9* 25.0*  PLT 361 351 350   Recent Labs  Lab 04/27/20 2137 04/29/20 0428 04/30/20 0022  NA 133* 137 132*  K 4.6 4.7 5.6*  CL 91* 96* 90*  CO2 25 27 25   BUN 48* 31* 52*  CREATININE 10.73* 7.83* 9.78*  CALCIUM 9.2 9.3 9.0  PROT  --  7.8 7.8  BILITOT  --  0.6 0.6  ALKPHOS  --  73 68  ALT  --  26 26  AST  --  24 15  GLUCOSE 148* 152* 288*     Imaging/Diagnostic Tests:  AORTA/RENALS: There are single renal arteries bilaterally with no significant renal artery stenosis identified.  The infrarenal aorta is widely patent.  INFLOW: The common iliac, external iliac, and hypogastric arteries are patent.  RIGHT LOWER EXTREMITY RUNOFF: On the right side, the common femoral, deep femoral, and superficial femoral artery are patent.  There is moderate disease of the below-knee popliteal artery.  Below that there is severe tibial artery occlusive disease.  The anterior tibial and posterior tibial arteries are occluded.  There is severe disease in the proximal peroneal artery and then the peroneal artery is patent and collateralizes the distal posterior  tibial artery.  The dorsalis pedis artery is occluded.  LEFT LOWER EXTREMITY RUNOFF: On the left side, which is the side of concern, the common femoral, deep femoral, and superficial femoral arteries are patent.  The popliteal artery occludes at the level of the knee. There are extensive collaterals suggesting that this is chronic.  The below-knee popliteal artery, anterior tibial, tibial peroneal trunk, peroneal, and posterior tibial arteries are occluded.  There is reconstitution of the peroneal artery in the proximal calf and then there is disease in the peroneal distally.  There is collateralization of a small posterior tibial artery distally which has moderate disease.   Theone Stanley, [place] 04/30/2020, 8:08 AM PGY-1, [place] Intern pager: [phone], text pages welcome

## 2020-04-30 NOTE — Progress Notes (Signed)
    Subjective  - POD #1, status post arteriogram  Resting comfortably   Physical Exam:  Groin cannulation site without complication. Ischemic changes to left foot Abdomen soft nontender Respirations nonlabored    Assessment/Plan:  POD #1  Ischemic left foot: I discussed with the patient that based on the level of tissue loss on the left foot, I do not think that revascularization will be able to salvage his foot.  I feel that he needs a primary below-knee amputation.  He is still debating whether or not he wants to proceed with this.  I talked to him for approximately 20 minutes and educated him on the logic behind this recommendation.  He would like to contemplate his options.  We will continue to follow the patient while he is in the hospital.  Annamarie Major 04/30/2020 10:13 AM --  Vitals:   04/30/20 0945 04/30/20 1000  BP: 100/62 (!) 98/55  Pulse: 75 77  Resp: 18   Temp: 98.3 F (36.8 C)   SpO2:      Intake/Output Summary (Last 24 hours) at 04/30/2020 1013 Last data filed at 04/29/2020 2300 Gross per 24 hour  Intake 150 ml  Output --  Net 150 ml     Laboratory CBC    Component Value Date/Time   WBC 16.2 (H) 04/30/2020 0022   HGB 7.5 (L) 04/30/2020 0022   HCT 25.0 (L) 04/30/2020 0022   PLT 350 04/30/2020 0022    BMET    Component Value Date/Time   NA 132 (L) 04/30/2020 0022   K 5.6 (H) 04/30/2020 0022   CL 90 (L) 04/30/2020 0022   CO2 25 04/30/2020 0022   GLUCOSE 288 (H) 04/30/2020 0022   BUN 52 (H) 04/30/2020 0022   CREATININE 9.78 (H) 04/30/2020 0022   CREATININE 7.17 (H) 10/23/2012 1554   CALCIUM 9.0 04/30/2020 0022   GFRNONAA 5 (L) 04/30/2020 0022   GFRAA 6 (L) 04/30/2020 0022    COAG Lab Results  Component Value Date   INR 0.92 03/06/2013   INR 1.04 10/17/2012   INR 0.96 02/07/2010   No results found for: PTT  Antibiotics Anti-infectives (From admission, onward)   Start     Dose/Rate Route Frequency Ordered Stop   04/30/20 1200   vancomycin (VANCOCIN) IVPB 1000 mg/200 mL premix     Discontinue     1,000 mg 200 mL/hr over 60 Minutes Intravenous Every T-Th-Sa (Hemodialysis) 04/28/20 1458     04/28/20 2200  piperacillin-tazobactam (ZOSYN) IVPB 2.25 g     Discontinue     2.25 g 100 mL/hr over 30 Minutes Intravenous Every 8 hours 04/28/20 2110     04/28/20 1431  vancomycin variable dose per unstable renal function (pharmacist dosing)  Status:  Discontinued         Does not apply See admin instructions 04/28/20 1431 04/28/20 1458   04/28/20 1430  vancomycin (VANCOCIN) 2,500 mg in sodium chloride 0.9 % 500 mL IVPB        2,500 mg 250 mL/hr over 120 Minutes Intravenous  Once 04/28/20 1426 04/28/20 2048   04/28/20 1400  piperacillin-tazobactam (ZOSYN) IVPB 3.375 g  Status:  Discontinued       Note to Pharmacy: RENALLY DOSE PER PHARMACY   3.375 g 100 mL/hr over 30 Minutes Intravenous  Once 04/28/20 1355 04/28/20 2110       V. Leia Alf, M.D., [facility] [place] Office: [phone] Pager:  [phone]

## 2020-05-01 ENCOUNTER — Inpatient Hospital Stay (HOSPITAL_COMMUNITY): Payer: No Typology Code available for payment source

## 2020-05-01 DIAGNOSIS — A419 Sepsis, unspecified organism: Secondary | ICD-10-CM | POA: Diagnosis not present

## 2020-05-01 DIAGNOSIS — Z01810 Encounter for preprocedural cardiovascular examination: Secondary | ICD-10-CM | POA: Diagnosis not present

## 2020-05-01 DIAGNOSIS — E118 Type 2 diabetes mellitus with unspecified complications: Secondary | ICD-10-CM | POA: Diagnosis not present

## 2020-05-01 DIAGNOSIS — N186 End stage renal disease: Secondary | ICD-10-CM | POA: Diagnosis not present

## 2020-05-01 DIAGNOSIS — I998 Other disorder of circulatory system: Secondary | ICD-10-CM | POA: Diagnosis not present

## 2020-05-01 LAB — GLUCOSE, CAPILLARY
Glucose-Capillary: 147 mg/dL — ABNORMAL HIGH (ref 70–99)
Glucose-Capillary: 163 mg/dL — ABNORMAL HIGH (ref 70–99)
Glucose-Capillary: 135 mg/dL — ABNORMAL HIGH (ref 70–99)
Glucose-Capillary: 126 mg/dL — ABNORMAL HIGH (ref 70–99)

## 2020-05-01 LAB — CBC
HCT: 26 % — ABNORMAL LOW (ref 39.0–52.0)
Hemoglobin: 7.7 g/dL — ABNORMAL LOW (ref 13.0–17.0)
MCH: 25.9 pg — ABNORMAL LOW (ref 26.0–34.0)
MCHC: 29.6 g/dL — ABNORMAL LOW (ref 30.0–36.0)
MCV: 87.5 fL (ref 80.0–100.0)
Platelets: 348 10*3/uL (ref 150–400)
RBC: 2.97 MIL/uL — ABNORMAL LOW (ref 4.22–5.81)
RDW: 17.2 % — ABNORMAL HIGH (ref 11.5–15.5)
WBC: 14.8 10*3/uL — ABNORMAL HIGH (ref 4.0–10.5)
nRBC: 0 % (ref 0.0–0.2)

## 2020-05-01 LAB — COMPREHENSIVE METABOLIC PANEL
ALT: 21 U/L (ref 0–44)
AST: 13 U/L — ABNORMAL LOW (ref 15–41)
Albumin: 2.1 g/dL — ABNORMAL LOW (ref 3.5–5.0)
Alkaline Phosphatase: 59 U/L (ref 38–126)
Anion gap: 15 (ref 5–15)
BUN: 41 mg/dL — ABNORMAL HIGH (ref 8–23)
CO2: 26 mmol/L (ref 22–32)
Calcium: 8.7 mg/dL — ABNORMAL LOW (ref 8.9–10.3)
Chloride: 93 mmol/L — ABNORMAL LOW (ref 98–111)
Creatinine, Ser: 8.21 mg/dL — ABNORMAL HIGH (ref 0.61–1.24)
GFR calc Af Amer: 7 mL/min — ABNORMAL LOW (ref >60)
GFR calc non Af Amer: 6 mL/min — ABNORMAL LOW (ref >60)
Glucose, Bld: 150 mg/dL — ABNORMAL HIGH (ref 70–99)
Potassium: 4.8 mmol/L (ref 3.5–5.1)
Sodium: 134 mmol/L — ABNORMAL LOW (ref 135–145)
Total Bilirubin: 0.5 mg/dL (ref 0.3–1.2)
Total Protein: 7.2 g/dL (ref 6.5–8.1)

## 2020-05-01 LAB — PREPARE RBC (CROSSMATCH)

## 2020-05-01 LAB — IRON AND TIBC
Iron: 9 ug/dL — ABNORMAL LOW (ref 45–182)
Saturation Ratios: 6 % — ABNORMAL LOW (ref 17.9–39.5)
TIBC: 157 ug/dL — ABNORMAL LOW (ref 250–450)
UIBC: 148 ug/dL

## 2020-05-01 LAB — HEMOGLOBIN AND HEMATOCRIT, BLOOD
HCT: 27.5 % — ABNORMAL LOW (ref 39.0–52.0)
Hemoglobin: 8.4 g/dL — ABNORMAL LOW (ref 13.0–17.0)

## 2020-05-01 LAB — FERRITIN: Ferritin: 218 ng/mL (ref 24–336)

## 2020-05-01 LAB — OCCULT BLOOD X 1 CARD TO LAB, STOOL: Fecal Occult Bld: NEGATIVE

## 2020-05-01 MED ORDER — SODIUM CHLORIDE 0.9% IV SOLUTION: Freq: Once | INTRAVENOUS | Status: DC

## 2020-05-01 MED ORDER — SENNA 8.6 MG PO TABS
1.0000 | ORAL_TABLET | Freq: Every day | ORAL | Status: DC
  Administered 2020-05-01 – 2020-05-06 (×5): 8.6 mg via ORAL
  Filled 2020-05-01 (×5): qty 1

## 2020-05-01 MED ORDER — ASCORBIC ACID 500 MG PO TABS
1000.0000 mg | ORAL_TABLET | Freq: Two times a day (BID) | ORAL | Status: DC
  Administered 2020-05-01 – 2020-05-06 (×10): 1000 mg via ORAL
  Filled 2020-05-01 (×10): qty 2

## 2020-05-01 MED ORDER — OXYCODONE HCL 5 MG PO TABS
5.0000 mg | ORAL_TABLET | Freq: Four times a day (QID) | ORAL | Status: DC | PRN
  Administered 2020-05-02 – 2020-05-03 (×3): 5 mg via ORAL
  Filled 2020-05-01 (×2): qty 1

## 2020-05-01 MED ORDER — MAGNESIUM CITRATE PO SOLN
1.0000 | Freq: Once | ORAL | Status: AC
  Administered 2020-05-01: 1 via ORAL
  Filled 2020-05-01: qty 296

## 2020-05-01 MED ORDER — SODIUM CHLORIDE 0.9 % IV BOLUS
500.0000 mL | Freq: Once | INTRAVENOUS | Status: AC
  Administered 2020-05-01: 250 mL via INTRAVENOUS

## 2020-05-01 MED ORDER — SODIUM CHLORIDE 0.9 % IV BOLUS
250.0000 mL | Freq: Once | INTRAVENOUS | Status: AC
  Administered 2020-05-01: 250 mL via INTRAVENOUS

## 2020-05-01 MED ORDER — BISACODYL 5 MG PO TBEC: 10.0000 mg | DELAYED_RELEASE_TABLET | Freq: Every day | ORAL | Status: DC | PRN

## 2020-05-01 MED ORDER — SODIUM CHLORIDE 0.9 % IV BOLUS: 500.0000 mL | Freq: Once | INTRAVENOUS | Status: DC

## 2020-05-01 MED ORDER — CARVEDILOL 3.125 MG PO TABS: 3.1250 mg | ORAL_TABLET | Freq: Two times a day (BID) | ORAL | Status: DC

## 2020-05-01 NOTE — Progress Notes (Signed)
Interim Progress Note- Late Entry S: Was paged by RN that patient was complaining of blurred vision, dizziness and with soft blood pressures.  Went to see patient, blood pressure low 797K systolic.  Patient stated he was having blurred vision, headache.  RN gave Tylenol with relief of headache.  O:  Blood pressure (!) 100/47, pulse 75, temperature 98.8 F (37.1 C), temperature source Oral, resp. rate 18, height 5\' 11"  (1.803 m), weight 124.8 kg, SpO2 95 %. General: Patient in no distress, awake, alert Cardio: Regular rate and rhythm, distal pulses not able to be palpated  Respiratory: Normal work of breathing, speaking in full sentences  A/P: We ordered a CT head that showed no evidence of acute intracranial abnormality.  1 unit PRBCs ordered with 250 cc normal saline bolus to start after transfusion.  Suspect that patient is volume down after HD.  We will continue to monitor pressures, symptoms.  CT Head 8/8 IMPRESSION: No CT evidence of acute intracranial abnormality. Redemonstrated chronic cortically based right occipital lobe infarct. Mild chronic small vessel ischemic changes within the cerebral white matter. Mild generalized parenchymal atrophy, progressed as compared to the head CT of 02/07/2010. Paranasal sinus mucosal thickening, most notably ethmoidal.

## 2020-05-01 NOTE — Progress Notes (Signed)
VASCULAR LAB    Lower extremity vein mapping completed.    Preliminary report:  See CV proc for preliminary results.  Kyler Lerette, RVT 05/01/2020, 9:39 AM

## 2020-05-01 NOTE — Significant Event (Signed)
Rapid Response Event Note   Reason for Call :  Change in vision and the nurse was worried that the patient was having a stroke  Initial Focused Assessment:  When entering the room the patient was AOx4.  The patient was watching TV.  He was not having any facial droop.  He was conversing with [place] the whole time we were in the room.  The patient later mentioned that he has had this happen to him before when he was due for his intraocular injections.       Interventions: NIH performed (0), patient's neuro status assessed, we transferred the patient to the chair from the bed.   Plan of Care:  We discussed with the RN to call us if there is any other change in the patient's neuro status.  The doctor had ordered a  CT of the patient's head to be sure.      Event Summary:   MD Notified:  Call Time: [place] Time: 2820 End Time: [place], RN

## 2020-05-01 NOTE — Progress Notes (Addendum)
Pharmacy Antibiotic Note  Jonathon Bailey is a 64 y.o. male admitted on 04/27/2020 with DFI.  Pharmacy has been consulted for vancomycin dosing. Patient has HD on TTS.  Plan: Continue Vancomycin 1000mg  IV qHD with TTS schedule. Target pre-dialysis level: 15-25 mcg/mL. Per VVS note on 8/7 patient is still debating BKA. If patient remains on abx, plan to draw level before HD on 8/12.   Height: 5\' 11"  (180.3 cm) Weight: 124.8 kg (275 lb 1.6 oz) IBW/kg (Calculated) : 75.3  Temp (24hrs), Avg:98.9 F (37.2 C), Min:98.1 F (36.7 C), Max:100.1 F (37.8 C)  Recent Labs  Lab 04/27/20 2137 04/28/20 1443 04/28/20 1802 04/29/20 0428 04/29/20 1016 04/29/20 1737 04/30/20 0022 05/01/20 0608  WBC 11.4*  --    < > 11.6* 12.6* 13.0* 16.2* 14.8*  CREATININE 10.73*  --   --  7.83*  --   --  9.78* 8.21*  LATICACIDVEN  --  1.3  --  1.1  --   --   --   --    < > = values in this interval not displayed.    Estimated Creatinine Clearance: 12.4 mL/min (A) (by C-G formula based on SCr of 8.21 mg/dL (H)).    Allergies  Allergen Reactions  . Ferumoxytol Itching       . Iron Itching        . Iodinated Diagnostic Agents Itching  . Lactose Other (See Comments)    GI issues    Antimicrobials this admission: Vanc 8/5>> Zosyn 8/5>>  Dose adjustments this admission:  Microbiology results: 8/5 Bcx >> NGTD  8/5 MRSA PCR: negative  Thank you for allowing pharmacy to be a part of this patient's care.  Mercy Riding, PharmD PGY1 Acute Care Pharmacy Resident Please refer to [facility] for unit-specific pharmacist

## 2020-05-01 NOTE — Progress Notes (Addendum)
[facility] Progress Note  Subjective: c/o constipation, no BM 3-4 days, no other new c/o's.   Vitals:   05/01/20 1310 05/01/20 1351 05/01/20 1409 05/01/20 1449  BP: 100/74 (!) 96/51 (!) 90/55 (!) 100/47  Pulse:    75  Resp: 19 (!) 21 16 18   Temp:    98.8 F (37.1 C)  TempSrc:    Oral  SpO2:    95%  Weight:      Height:        Exam: General:WDWN obese male in NAD Heart:RRR Lungs:CTAB Abdomen:soft, NTND Extremities:trace b/l edema, L foot cold, skin sloughing, necrotic 2nd toe with darkened skin on toes/forefoot/ankle Dialysis Access: TDC dressed     OP HD: [facility]  4.5h  400/800  126kg  2/2.25 bath  Hep 12000  TDC  Mircera49mcg q2wks - just restarted,last 10/20/19  Hectorol25mcg IV qHD   Assessment/ Plan: 1. Gangrene/ ischemia L foot w/ cellulitis-Xray w/o signs of osteomyelitis, on empiric IV abx w/ vanc/ zosyn.  VVS consulted and did bilat LE arteriogram 8/6.  Pt has severe bilat PAD below the knees.  Per VVS surg revasc unlikely to heal a TMA so BKA recommended.  Pt states he is supposed to have the surgery tomorrow.  2. ESRD- HD TTS.  Next HD due Tuesday 8/10.  3. Hypertension/volume- BP's soft and under dry wt. Will give NS bolus 500 cc. Coreg on hold now w/ soft BP's.  4. Anemiaof CKD- Hgb 8.0 today. Aranesp 28mcg given with HD 8/5. No IV iron due to allergy.  5. Secondary Hyperparathyroidism -Ca at goal.  Phos elevated. Continue VDRA, sensiparand binders. 6.  Constipation - pt requesting mag citrate + po dulcolax , his favorite combination for constipation at home. Have ordered x 1.  7. Nutrition- Renal diet w/fluid restrictions.  8. DMT2 -per primary 9. CAD s/p CABG - per primary 10. Hx CVA 11. COPD 46. OSA - on CPAP     Rob Stclair Szymborski 05/01/2020, 4:47 PM   Recent Labs  Lab 04/29/20 0428 04/29/20 1016 04/30/20 0022 04/30/20 0022 04/30/20 2014 05/01/20 0608  K 4.7  --  5.6*  --   --  4.8  BUN 31*  --  52*  --   --   41*  CREATININE 7.83*  --  9.78*  --   --  8.21*  CALCIUM 9.3  --  9.0  --   --  8.7*  PHOS 5.9*  --   --   --   --   --   HGB 8.0*   < > 7.5*   < > 8.7* 7.7*   < > = values in this interval not displayed.   Inpatient medications: . sodium chloride   Intravenous Once  . sodium chloride   Intravenous Once  . vitamin C  1,000 mg Oral BID  . aspirin  81 mg Oral QODAY  . atorvastatin  40 mg Oral QHS  . Chlorhexidine Gluconate Cloth  6 each Topical Q0600  . cinacalcet  90 mg Oral Q supper  . darbepoetin (ARANESP) injection - DIALYSIS  40 mcg Intravenous Q Thu-HD  . doxercalciferol  6 mcg Intravenous Q T,Th,Sa-HD  . insulin aspart  0-9 Units Subcutaneous TID WC  . insulin glargine  15 Units Subcutaneous QHS  . magnesium citrate  1 Bottle Oral Once  . multivitamin  1 tablet Oral QHS  . polyethylene glycol  17 g Oral Daily  . senna  1 tablet Oral Daily  .  sevelamer carbonate  4.8 g Oral TID WC  . sodium chloride flush  3 mL Intravenous Q12H  . umeclidinium bromide  1 puff Inhalation Daily   . sodium chloride    . piperacillin-tazobactam (ZOSYN)  IV 2.25 g (05/01/20 0757)  . vancomycin     sodium chloride, acetaminophen **OR** acetaminophen, benzonatate, bisacodyl, ondansetron (ZOFRAN) IV, oxyCODONE, sodium chloride flush, tobramycin

## 2020-05-01 NOTE — Progress Notes (Signed)
Subjective  -   No acute overnight events   Physical Exam:  Resting comfortably in bed Respirations unlabored Abdomen is soft and nontender Continued ischemic changes to the left foot       Assessment/Plan:    I had another extensive conversation with the patient about treatment options.  Because of the extent of tissue loss, I do not think that even with revascularization that his foot is salvageable.  I agree with Dr. Donnetta Hutching and that primary left below-knee amputation is his best option.  The patient has yet to decide whether or not he wants to proceed but is leaning towards getting this done tomorrow.  I will make him n.p.o. after midnight and consent him in case he is agreeable.  Jonathon Bailey 05/01/2020 5:42 PM --  Vitals:   05/01/20 1409 05/01/20 1449  BP: (!) 90/55 (!) 100/47  Pulse:  75  Resp: 16 18  Temp:  98.8 F (37.1 C)  SpO2:  95%    Intake/Output Summary (Last 24 hours) at 05/01/2020 1742 Last data filed at 05/01/2020 1445 Gross per 24 hour  Intake 323 ml  Output --  Net 323 ml     Laboratory CBC    Component Value Date/Time   WBC 14.8 (H) 05/01/2020 0608   HGB 8.4 (L) 05/01/2020 1603   HCT 27.5 (L) 05/01/2020 1603   PLT 348 05/01/2020 0608    BMET    Component Value Date/Time   NA 134 (L) 05/01/2020 0608   K 4.8 05/01/2020 0608   CL 93 (L) 05/01/2020 0608   CO2 26 05/01/2020 0608   GLUCOSE 150 (H) 05/01/2020 0608   BUN 41 (H) 05/01/2020 0608   CREATININE 8.21 (H) 05/01/2020 0608   CREATININE 7.17 (H) 10/23/2012 1554   CALCIUM 8.7 (L) 05/01/2020 0608   GFRNONAA 6 (L) 05/01/2020 0608   GFRAA 7 (L) 05/01/2020 0608    COAG Lab Results  Component Value Date   INR 0.92 03/06/2013   INR 1.04 10/17/2012   INR 0.96 02/07/2010   No results found for: PTT  Antibiotics Anti-infectives (From admission, onward)   Start     Dose/Rate Route Frequency Ordered Stop   04/30/20 1200  vancomycin (VANCOCIN) IVPB 1000 mg/200 mL premix      Discontinue     1,000 mg 200 mL/hr over 60 Minutes Intravenous Every T-Th-Sa (Hemodialysis) 04/28/20 1458     04/30/20 1053  vancomycin (VANCOCIN) 1-5 GM/200ML-% IVPB       Note to Pharmacy: [name], Bailey   : cabinet override      04/30/20 1053 04/30/20 1055   04/28/20 2200  piperacillin-tazobactam (ZOSYN) IVPB 2.25 g     Discontinue     2.25 g 100 mL/hr over 30 Minutes Intravenous Every 8 hours 04/28/20 2110     04/28/20 1431  vancomycin variable dose per unstable renal function (pharmacist dosing)  Status:  Discontinued         Does not apply See admin instructions 04/28/20 1431 04/28/20 1458   04/28/20 1430  vancomycin (VANCOCIN) 2,500 mg in sodium chloride 0.9 % 500 mL IVPB        2,500 mg 250 mL/hr over 120 Minutes Intravenous  Once 04/28/20 1426 04/28/20 2048   04/28/20 1400  piperacillin-tazobactam (ZOSYN) IVPB 3.375 g  Status:  Discontinued       Note to Pharmacy: RENALLY DOSE PER PHARMACY   3.375 g 100 mL/hr over 30 Minutes Intravenous  Once 04/28/20 1355 04/28/20 2110  [name], M.D., [facility] [place] Office: [phone] Pager:  [phone]

## 2020-05-01 NOTE — Progress Notes (Signed)
FMTS Brief Note  In to check on patient after episode of dizziness. He just returned from CT. Reviewed findings.   Patient reports symptoms resolved. He is tearful during discussion, supportive listening provided. Declines chaplain. Nursing has been very attentive to patient needs today, which he appreciates.  Specifically denies chest pain, dyspnea, dizziness, vision changes. Endorses constipation. Miralax + Senna ordered.   [facility] consultation and care.   Dorris Singh, MD  Family Medicine Teaching Service

## 2020-05-02 ENCOUNTER — Other Ambulatory Visit (HOSPITAL_COMMUNITY): Payer: No Typology Code available for payment source

## 2020-05-02 ENCOUNTER — Inpatient Hospital Stay (HOSPITAL_COMMUNITY): Payer: No Typology Code available for payment source | Admitting: Certified Registered Nurse Anesthetist

## 2020-05-02 ENCOUNTER — Encounter (HOSPITAL_COMMUNITY): Payer: Self-pay | Admitting: Family Medicine

## 2020-05-02 ENCOUNTER — Encounter (HOSPITAL_COMMUNITY)
Admission: EM | Disposition: A | Discharge status: Rehabilitation Facility | Payer: Self-pay | Source: Home / Self Care | Attending: Family Medicine

## 2020-05-02 ENCOUNTER — Inpatient Hospital Stay (HOSPITAL_COMMUNITY): Payer: No Typology Code available for payment source

## 2020-05-02 DIAGNOSIS — I96 Gangrene, not elsewhere classified: Secondary | ICD-10-CM

## 2020-05-02 DIAGNOSIS — I739 Peripheral vascular disease, unspecified: Secondary | ICD-10-CM

## 2020-05-02 DIAGNOSIS — N186 End stage renal disease: Secondary | ICD-10-CM | POA: Diagnosis not present

## 2020-05-02 DIAGNOSIS — E118 Type 2 diabetes mellitus with unspecified complications: Secondary | ICD-10-CM | POA: Diagnosis not present

## 2020-05-02 DIAGNOSIS — A419 Sepsis, unspecified organism: Secondary | ICD-10-CM | POA: Diagnosis not present

## 2020-05-02 DIAGNOSIS — I998 Other disorder of circulatory system: Secondary | ICD-10-CM | POA: Diagnosis not present

## 2020-05-02 DIAGNOSIS — I5042 Chronic combined systolic (congestive) and diastolic (congestive) heart failure: Secondary | ICD-10-CM

## 2020-05-02 HISTORY — PX: AMPUTATION: SHX166

## 2020-05-02 LAB — TYPE AND SCREEN
ABO/RH(D): O POS
Antibody Screen: NEGATIVE
Unit division: 0
Unit division: 0

## 2020-05-02 LAB — ECHOCARDIOGRAM COMPLETE
AR max vel: 0.98 cm2
AV Area VTI: 1.06 cm2
AV Area mean vel: 1.07 cm2
AV Mean grad: 12.5 mmHg
AV Peak grad: 21.5 mmHg
Ao pk vel: 2.32 m/s
Area-P 1/2: 2.5 cm2
Height: 71 in
MV VTI: 1.27 cm2
S' Lateral: 3.6 cm
Weight: 4439.99 oz

## 2020-05-02 LAB — CBC
HCT: 27.4 % — ABNORMAL LOW (ref 39.0–52.0)
Hemoglobin: 8.5 g/dL — ABNORMAL LOW (ref 13.0–17.0)
MCH: 27.2 pg (ref 26.0–34.0)
MCHC: 31 g/dL (ref 30.0–36.0)
MCV: 87.5 fL (ref 80.0–100.0)
Platelets: 365 10*3/uL (ref 150–400)
RBC: 3.13 MIL/uL — ABNORMAL LOW (ref 4.22–5.81)
RDW: 16.9 % — ABNORMAL HIGH (ref 11.5–15.5)
WBC: 16.2 10*3/uL — ABNORMAL HIGH (ref 4.0–10.5)
nRBC: 0 % (ref 0.0–0.2)

## 2020-05-02 LAB — COMPREHENSIVE METABOLIC PANEL
ALT: 27 U/L (ref 0–44)
AST: 21 U/L (ref 15–41)
Albumin: 2.2 g/dL — ABNORMAL LOW (ref 3.5–5.0)
Alkaline Phosphatase: 70 U/L (ref 38–126)
Anion gap: 17 — ABNORMAL HIGH (ref 5–15)
BUN: 54 mg/dL — ABNORMAL HIGH (ref 8–23)
CO2: 23 mmol/L (ref 22–32)
Calcium: 8.3 mg/dL — ABNORMAL LOW (ref 8.9–10.3)
Chloride: 95 mmol/L — ABNORMAL LOW (ref 98–111)
Creatinine, Ser: 10.15 mg/dL — ABNORMAL HIGH (ref 0.61–1.24)
GFR calc Af Amer: 6 mL/min — ABNORMAL LOW (ref >60)
GFR calc non Af Amer: 5 mL/min — ABNORMAL LOW (ref >60)
Glucose, Bld: 100 mg/dL — ABNORMAL HIGH (ref 70–99)
Potassium: 4.7 mmol/L (ref 3.5–5.1)
Sodium: 135 mmol/L (ref 135–145)
Total Bilirubin: 0.6 mg/dL (ref 0.3–1.2)
Total Protein: 7.9 g/dL (ref 6.5–8.1)

## 2020-05-02 LAB — PROTIME-INR
INR: 1.4 — ABNORMAL HIGH (ref 0.8–1.2)
Prothrombin Time: 16.2 seconds — ABNORMAL HIGH (ref 11.4–15.2)

## 2020-05-02 LAB — BPAM RBC
Blood Product Expiration Date: 202109012359
Blood Product Expiration Date: 202109032359
ISSUE DATE / TIME: 202108070908
ISSUE DATE / TIME: 202108081058
Unit Type and Rh: 5100
Unit Type and Rh: 5100

## 2020-05-02 LAB — HEMOGLOBIN AND HEMATOCRIT, BLOOD
HCT: 26.1 % — ABNORMAL LOW (ref 39.0–52.0)
Hemoglobin: 7.9 g/dL — ABNORMAL LOW (ref 13.0–17.0)

## 2020-05-02 LAB — GLUCOSE, CAPILLARY
Glucose-Capillary: 101 mg/dL — ABNORMAL HIGH (ref 70–99)
Glucose-Capillary: 96 mg/dL (ref 70–99)
Glucose-Capillary: 82 mg/dL (ref 70–99)
Glucose-Capillary: 98 mg/dL (ref 70–99)
Glucose-Capillary: 216 mg/dL — ABNORMAL HIGH (ref 70–99)
Glucose-Capillary: 235 mg/dL — ABNORMAL HIGH (ref 70–99)

## 2020-05-02 SURGERY — AMPUTATION BELOW KNEE
Anesthesia: Regional | Site: Knee | Laterality: Left

## 2020-05-02 MED ORDER — LABETALOL HCL 5 MG/ML IV SOLN: 10.0000 mg | INTRAVENOUS | Status: DC | PRN

## 2020-05-02 MED ORDER — OXYCODONE HCL 5 MG/5ML PO SOLN: 5.0000 mg | Freq: Once | ORAL | Status: DC | PRN

## 2020-05-02 MED ORDER — DEXAMETHASONE SODIUM PHOSPHATE 10 MG/ML IJ SOLN
INTRAMUSCULAR | Status: DC | PRN
  Administered 2020-05-02: 10 mg via INTRAVENOUS

## 2020-05-02 MED ORDER — PROPOFOL 10 MG/ML IV BOLUS
INTRAVENOUS | Status: AC
  Filled 2020-05-02: qty 20

## 2020-05-02 MED ORDER — CHLORHEXIDINE GLUCONATE CLOTH 2 % EX PADS
6.0000 | MEDICATED_PAD | Freq: Every day | CUTANEOUS | Status: DC
  Administered 2020-05-03: 6 via TOPICAL

## 2020-05-02 MED ORDER — FENTANYL CITRATE (PF) 100 MCG/2ML IJ SOLN
INTRAMUSCULAR | Status: AC
  Administered 2020-05-02: 50 ug via INTRAVENOUS
  Filled 2020-05-02: qty 2

## 2020-05-02 MED ORDER — HEPARIN SODIUM (PORCINE) 5000 UNIT/ML IJ SOLN
5000.0000 [IU] | Freq: Three times a day (TID) | INTRAMUSCULAR | Status: DC
  Administered 2020-05-03 – 2020-05-06 (×9): 5000 [IU] via SUBCUTANEOUS
  Filled 2020-05-02 (×9): qty 1

## 2020-05-02 MED ORDER — HYDRALAZINE HCL 20 MG/ML IJ SOLN: 5.0000 mg | INTRAMUSCULAR | Status: DC | PRN

## 2020-05-02 MED ORDER — ACETAMINOPHEN 325 MG PO TABS: 325.0000 mg | ORAL_TABLET | ORAL | Status: DC | PRN

## 2020-05-02 MED ORDER — MIDAZOLAM HCL 2 MG/2ML IJ SOLN
INTRAMUSCULAR | Status: AC
  Filled 2020-05-02: qty 2

## 2020-05-02 MED ORDER — BUPIVACAINE HCL (PF) 0.25 % IJ SOLN
INTRAMUSCULAR | Status: DC | PRN
  Administered 2020-05-02: 30 mL

## 2020-05-02 MED ORDER — SODIUM CHLORIDE 0.9 % IV SOLN: INTRAVENOUS | Status: DC

## 2020-05-02 MED ORDER — BUPIVACAINE LIPOSOME 1.3 % IJ SUSP
INTRAMUSCULAR | Status: DC | PRN
  Administered 2020-05-02: 10 mL via PERINEURAL

## 2020-05-02 MED ORDER — PROPOFOL 10 MG/ML IV BOLUS
INTRAVENOUS | Status: DC | PRN
  Administered 2020-05-02: 200 mg via INTRAVENOUS

## 2020-05-02 MED ORDER — CEFAZOLIN SODIUM-DEXTROSE 2-4 GM/100ML-% IV SOLN
2.0000 g | Freq: Three times a day (TID) | INTRAVENOUS | Status: AC
  Administered 2020-05-02: 2 g via INTRAVENOUS
  Filled 2020-05-02: qty 100

## 2020-05-02 MED ORDER — ONDANSETRON HCL 4 MG/2ML IJ SOLN
INTRAMUSCULAR | Status: DC | PRN
  Administered 2020-05-02: 4 mg via INTRAVENOUS

## 2020-05-02 MED ORDER — CHLORHEXIDINE GLUCONATE 0.12 % MT SOLN
15.0000 mL | OROMUCOSAL | Status: AC
  Filled 2020-05-02: qty 15

## 2020-05-02 MED ORDER — ACETAMINOPHEN 500 MG PO TABS
1000.0000 mg | ORAL_TABLET | Freq: Once | ORAL | Status: AC
  Administered 2020-05-02: 1000 mg via ORAL
  Filled 2020-05-02: qty 2

## 2020-05-02 MED ORDER — MAGNESIUM SULFATE 2 GM/50ML IV SOLN: 2.0000 g | Freq: Every day | INTRAVENOUS | Status: DC | PRN

## 2020-05-02 MED ORDER — FENTANYL CITRATE (PF) 100 MCG/2ML IJ SOLN: 50.0000 ug | Freq: Once | INTRAMUSCULAR | Status: AC

## 2020-05-02 MED ORDER — GUAIFENESIN-DM 100-10 MG/5ML PO SYRP: 15.0000 mL | ORAL_SOLUTION | ORAL | Status: DC | PRN

## 2020-05-02 MED ORDER — DOCUSATE SODIUM 100 MG PO CAPS
100.0000 mg | ORAL_CAPSULE | Freq: Every day | ORAL | Status: DC
  Administered 2020-05-03: 100 mg via ORAL
  Filled 2020-05-02: qty 1

## 2020-05-02 MED ORDER — FENTANYL CITRATE (PF) 250 MCG/5ML IJ SOLN
INTRAMUSCULAR | Status: AC
  Filled 2020-05-02: qty 5

## 2020-05-02 MED ORDER — EPHEDRINE SULFATE-NACL 50-0.9 MG/10ML-% IV SOSY
PREFILLED_SYRINGE | INTRAVENOUS | Status: DC | PRN
  Administered 2020-05-02: 10 mg via INTRAVENOUS
  Administered 2020-05-02 (×2): 5 mg via INTRAVENOUS
  Administered 2020-05-02: 10 mg via INTRAVENOUS

## 2020-05-02 MED ORDER — HYDROMORPHONE HCL 1 MG/ML IJ SOLN: 0.2500 mg | INTRAMUSCULAR | Status: DC | PRN

## 2020-05-02 MED ORDER — OXYCODONE HCL 5 MG PO TABS: 5.0000 mg | ORAL_TABLET | Freq: Once | ORAL | Status: DC | PRN

## 2020-05-02 MED ORDER — ACETAMINOPHEN 650 MG RE SUPP: 325.0000 mg | RECTAL | Status: DC | PRN

## 2020-05-02 MED ORDER — CHLORHEXIDINE GLUCONATE 0.12 % MT SOLN
OROMUCOSAL | Status: AC
  Administered 2020-05-02: 15 mL via OROMUCOSAL
  Filled 2020-05-02: qty 15

## 2020-05-02 MED ORDER — PANTOPRAZOLE SODIUM 40 MG PO TBEC
40.0000 mg | DELAYED_RELEASE_TABLET | Freq: Every day | ORAL | Status: DC
  Administered 2020-05-02 – 2020-05-06 (×5): 40 mg via ORAL
  Filled 2020-05-02 (×5): qty 1

## 2020-05-02 MED ORDER — MELATONIN 5 MG PO TABS
5.0000 mg | ORAL_TABLET | Freq: Every day | ORAL | Status: DC
  Administered 2020-05-02 – 2020-05-05 (×4): 5 mg via ORAL
  Filled 2020-05-02 (×4): qty 1

## 2020-05-02 MED ORDER — PHENOL 1.4 % MT LIQD: 1.0000 | OROMUCOSAL | Status: DC | PRN

## 2020-05-02 MED ORDER — ALUM & MAG HYDROXIDE-SIMETH 200-200-20 MG/5ML PO SUSP: 15.0000 mL | ORAL | Status: DC | PRN

## 2020-05-02 MED ORDER — ONDANSETRON HCL 4 MG/2ML IJ SOLN: 4.0000 mg | Freq: Four times a day (QID) | INTRAMUSCULAR | Status: DC | PRN

## 2020-05-02 MED ORDER — MIDAZOLAM HCL 2 MG/2ML IJ SOLN
INTRAMUSCULAR | Status: AC
  Administered 2020-05-02: 1 mg via INTRAVENOUS
  Filled 2020-05-02: qty 2

## 2020-05-02 MED ORDER — DARBEPOETIN ALFA 100 MCG/0.5ML IJ SOSY
100.0000 ug | PREFILLED_SYRINGE | INTRAMUSCULAR | Status: DC
  Administered 2020-05-05: 100 ug via INTRAVENOUS
  Filled 2020-05-02: qty 0.5

## 2020-05-02 MED ORDER — ONDANSETRON HCL 4 MG/2ML IJ SOLN: 4.0000 mg | Freq: Once | INTRAMUSCULAR | Status: DC | PRN

## 2020-05-02 MED ORDER — HEPARIN SODIUM (PORCINE) 5000 UNIT/ML IJ SOLN: 5000.0000 [IU] | Freq: Three times a day (TID) | INTRAMUSCULAR | Status: DC

## 2020-05-02 MED ORDER — 0.9 % SODIUM CHLORIDE (POUR BTL) OPTIME
TOPICAL | Status: DC | PRN
  Administered 2020-05-02: 1000 mL

## 2020-05-02 MED ORDER — MIDAZOLAM HCL 2 MG/2ML IJ SOLN: 1.0000 mg | Freq: Once | INTRAMUSCULAR | Status: AC

## 2020-05-02 MED ORDER — POTASSIUM CHLORIDE CRYS ER 20 MEQ PO TBCR: 20.0000 meq | EXTENDED_RELEASE_TABLET | Freq: Every day | ORAL | Status: DC | PRN

## 2020-05-02 MED ORDER — PHENYLEPHRINE 40 MCG/ML (10ML) SYRINGE FOR IV PUSH (FOR BLOOD PRESSURE SUPPORT)
PREFILLED_SYRINGE | INTRAVENOUS | Status: DC | PRN
  Administered 2020-05-02: 80 ug via INTRAVENOUS
  Administered 2020-05-02: 120 ug via INTRAVENOUS
  Administered 2020-05-02 (×2): 80 ug via INTRAVENOUS

## 2020-05-02 MED ORDER — FENTANYL CITRATE (PF) 250 MCG/5ML IJ SOLN
INTRAMUSCULAR | Status: DC | PRN
  Administered 2020-05-02 (×2): 50 ug via INTRAVENOUS
  Administered 2020-05-02: 25 ug via INTRAVENOUS
  Administered 2020-05-02: 50 ug via INTRAVENOUS

## 2020-05-02 MED ORDER — PHENYLEPHRINE HCL-NACL 10-0.9 MG/250ML-% IV SOLN
INTRAVENOUS | Status: DC | PRN
  Administered 2020-05-02: 15 ug/min via INTRAVENOUS
  Administered 2020-05-02: 30 ug/min via INTRAVENOUS

## 2020-05-02 MED ORDER — METOPROLOL TARTRATE 5 MG/5ML IV SOLN: 2.0000 mg | INTRAVENOUS | Status: DC | PRN

## 2020-05-02 MED ORDER — LIDOCAINE 2% (20 MG/ML) 5 ML SYRINGE
INTRAMUSCULAR | Status: DC | PRN
  Administered 2020-05-02: 20 mg via INTRAVENOUS

## 2020-05-02 SURGICAL SUPPLY — 55 items
BANDAGE ESMARK 6X9 LF (GAUZE/BANDAGES/DRESSINGS) IMPLANT
BLADE SAW GIGLI 510 (BLADE) ×2 IMPLANT
BLADE SAW GIGLI 510MM (BLADE) ×1
BNDG CMPR 9X6 STRL LF SNTH (GAUZE/BANDAGES/DRESSINGS)
BNDG ELASTIC 4X5.8 VLCR STR LF (GAUZE/BANDAGES/DRESSINGS) ×1 IMPLANT
BNDG ELASTIC 6X5.8 VLCR STR LF (GAUZE/BANDAGES/DRESSINGS) ×2 IMPLANT
BNDG ESMARK 6X9 LF (GAUZE/BANDAGES/DRESSINGS)
BNDG GAUZE ELAST 4 BULKY (GAUZE/BANDAGES/DRESSINGS) ×2 IMPLANT
CANISTER SUCT 3000ML PPV (MISCELLANEOUS) ×3 IMPLANT
CLIP LIGATING EXTRA MED SLVR (CLIP) ×3 IMPLANT
CLIP LIGATING EXTRA SM BLUE (MISCELLANEOUS) ×3 IMPLANT
COVER SURGICAL LIGHT HANDLE (MISCELLANEOUS) ×4 IMPLANT
COVER WAND RF STERILE (DRAPES) ×1 IMPLANT
CUFF TOURN SGL QUICK 34 (TOURNIQUET CUFF)
CUFF TOURN SGL QUICK 42 (TOURNIQUET CUFF) IMPLANT
CUFF TRNQT CYL 34X4.125X (TOURNIQUET CUFF) IMPLANT
DRAIN SNY 10X20 3/4 PERF (WOUND CARE) IMPLANT
DRAPE HALF SHEET 40X57 (DRAPES) ×1 IMPLANT
DRAPE ORTHO SPLIT 77X108 STRL (DRAPES) ×6
DRAPE SURG ORHT 6 SPLT 77X108 (DRAPES) ×2 IMPLANT
ELECT REM PT RETURN 9FT ADLT (ELECTROSURGICAL) ×3
ELECTRODE REM PT RTRN 9FT ADLT (ELECTROSURGICAL) ×1 IMPLANT
EVACUATOR SILICONE 100CC (DRAIN) IMPLANT
GAUZE SPONGE 4X4 12PLY STRL (GAUZE/BANDAGES/DRESSINGS) ×3 IMPLANT
GAUZE SPONGE 4X4 16PLY XRAY LF (GAUZE/BANDAGES/DRESSINGS) ×2 IMPLANT
GAUZE XEROFORM 5X9 LF (GAUZE/BANDAGES/DRESSINGS) ×3 IMPLANT
GLOVE BIO SURGEON STRL SZ7 (GLOVE) ×2 IMPLANT
GLOVE BIOGEL PI IND STRL 7.0 (GLOVE) IMPLANT
GLOVE BIOGEL PI IND STRL 7.5 (GLOVE) IMPLANT
GLOVE BIOGEL PI IND STRL 8 (GLOVE) IMPLANT
GLOVE BIOGEL PI INDICATOR 7.0 (GLOVE) ×2
GLOVE BIOGEL PI INDICATOR 7.5 (GLOVE) ×4
GLOVE BIOGEL PI INDICATOR 8 (GLOVE) ×2
GLOVE SS BIOGEL STRL SZ 7.5 (GLOVE) ×1 IMPLANT
GLOVE SUPERSENSE BIOGEL SZ 7.5 (GLOVE) ×2
GOWN STRL REUS W/ TWL LRG LVL3 (GOWN DISPOSABLE) ×3 IMPLANT
GOWN STRL REUS W/ TWL XL LVL3 (GOWN DISPOSABLE) IMPLANT
GOWN STRL REUS W/TWL LRG LVL3 (GOWN DISPOSABLE) ×6
GOWN STRL REUS W/TWL XL LVL3 (GOWN DISPOSABLE) ×6
KIT BASIN OR (CUSTOM PROCEDURE TRAY) ×3 IMPLANT
KIT TURNOVER KIT B (KITS) ×3 IMPLANT
NS IRRIG 1000ML POUR BTL (IV SOLUTION) ×3 IMPLANT
PACK GENERAL/GYN (CUSTOM PROCEDURE TRAY) ×3 IMPLANT
PAD ARMBOARD 7.5X6 YLW CONV (MISCELLANEOUS) ×4 IMPLANT
PADDING CAST COTTON 6X4 STRL (CAST SUPPLIES) IMPLANT
STAPLER VISISTAT 35W (STAPLE) ×3 IMPLANT
STOCKINETTE IMPERVIOUS LG (DRAPES) ×3 IMPLANT
SUT ETHILON 3 0 PS 1 (SUTURE) IMPLANT
SUT VIC AB 0 CT1 18XCR BRD 8 (SUTURE) ×2 IMPLANT
SUT VIC AB 0 CT1 8-18 (SUTURE) ×6
SUT VICRYL AB 2 0 TIES (SUTURE) ×3 IMPLANT
SYR TOOMEY 50ML (SYRINGE) ×2 IMPLANT
TOWEL GREEN STERILE (TOWEL DISPOSABLE) ×4 IMPLANT
UNDERPAD 30X36 HEAVY ABSORB (UNDERPADS AND DIAPERS) ×3 IMPLANT
WATER STERILE IRR 1000ML POUR (IV SOLUTION) ×3 IMPLANT

## 2020-05-02 NOTE — Transfer of Care (Signed)
Immediate Anesthesia Transfer of Care Note  Patient: Jonathon Bailey  Procedure(s) Performed: AMPUTATION BELOW KNEE (Left Knee)  Patient Location: PACU  Anesthesia Type:General and GA combined with regional for post-op pain  Level of Consciousness: drowsy and patient cooperative  Airway & Oxygen Therapy: Patient Spontanous Breathing  Post-op Assessment: Report given to RN and Post -op Vital signs reviewed and stable  Post vital signs: Reviewed and stable  Last Vitals:  Vitals Value Taken Time  BP 105/55 05/02/20 1127  Temp    Pulse 78 05/02/20 1129  Resp 19 05/02/20 1129  SpO2 94 % 05/02/20 1129  Vitals shown include unvalidated device data.  Last Pain:  Vitals:   05/02/20 0815  TempSrc: Oral  PainSc: 0-No pain      Patients Stated Pain Goal: 0 (02/98/47 3085)  Complications: No complications documented.

## 2020-05-02 NOTE — Op Note (Signed)
° ° °  OPERATIVE REPORT  DATE OF SURGERY: 05/02/2020  PATIENT: Jonathon Bailey, 64 y.o. male MRN: [id number]  DOB: 06-Aug-1956  PRE-OPERATIVE DIAGNOSIS: Gangrene left foot  POST-OPERATIVE DIAGNOSIS:  Same  PROCEDURE: Left below-knee amputation  SURGEON:  Curt Jews, M.D.  PHYSICIAN ASSISTANT: Nurse  The assistant was needed for exposure and to expedite the case  ANESTHESIA: LMA and block  EBL: per anesthesia record  Total I/O In: -  Out: 150 [Blood:150]  BLOOD ADMINISTERED: none  DRAINS: none  SPECIMEN: none  COUNTS CORRECT:  YES  PATIENT DISPOSITION:  PACU - hemodynamically stable  PROCEDURE DETAILS: Patient was taken up and placed supine straight area of the left leg prepped draped you sterile fashion.  Vision was made several centimeters below the tibial prominence and carried down through the anterior tibial muscle bodies.  The patient is very muscular.  The anterior tibial neurovascular bundle was ligated and divided.  A posterior based muscle flap was left in place.  The gastrocnemius muscle was left in place with the skin flap and portion of the soleus muscle was resected.  The encountered tibial vessels were ligated with 0 Vicryl ties and divided.  Patient had good bleeding to the muscle bodies.  The periosteum was elevated on the tibia and the tibia was resected with a Gigli saw.  The bone edges were smoothed with a bone rasp.  Periosteum was also elevated on the fibula and the fibula was divided with a bone shear.  The specimen was passed off the field.  Wound irrigated with saline and hemostasis was obtained with electrocautery.  The wound was closed with 0 Vicryl figure-of-eight sutures to reapproximate the anterior fascia to the posterior fascia.  The skin was closed with skin staples.  Xeroform gauze, 4 x 4's Curlex and a 6 inch Ace wrap placed over the amputation stump.  The patient was transferred to the recovery room in stable condition   Rosetta Posner, M.D.,  [facility] 05/02/2020 11:27 AM

## 2020-05-02 NOTE — Progress Notes (Signed)
Pt requesting something for sleep tonight. Provider on call notified via [place]. Jessie Foot, RN

## 2020-05-02 NOTE — Progress Notes (Signed)
Echocardiogram 2D Echocardiogram has been performed.  Jonathon Bailey 05/02/2020, 2:11 PM

## 2020-05-02 NOTE — Progress Notes (Signed)
Pt states he remains undecided about proceeding with amputation and wished to discuss further with Dr Early prior to signing consent. Jessie Foot, RN

## 2020-05-02 NOTE — H&P (View-Only) (Signed)
Patient ID: Jonathon Bailey, male   DOB: 1955-12-15, 64 y.o.   MRN: [id number] Comfortable this morning. Events of last night noted. Completely intact neurologically.  Has had progressive dry gangrene in his left foot with second and third toe and now the dorsum of his foot and the first metatarsal head. Again explained he has a nonfunctional foot with everything debrided that is currently nonviable. Also has no reasonable revascularization options.   He is not having a great deal of pain. He has dry gangrene. I did explain to him that there is no urgency for amputation but no alternatives. He is comfortable with the decision to proceed with below-knee amputation. I went to the specifics as far as mid calf amputation so he can be fitted with prosthesis. We will proceed with surgery today

## 2020-05-02 NOTE — Progress Notes (Signed)
Vascular surgeon stated ok for IV to be in left arm.

## 2020-05-02 NOTE — Progress Notes (Signed)
[facility] Progress Note   Subjective:  Seen in room - s/p L BKA this morning. No CP or dyspnea overnight. Looks like he had complained of vision changes - ended up getting head CT which showed no acute issues. Pain controlled for now.   Objective Vitals:   05/02/20 0846 05/02/20 1127 05/02/20 1142 05/02/20 1159  BP:  (!) 105/55 101/62   Pulse:  81 77   Resp:  19 16 14   Temp:  99 F (37.2 C) 98.3 F (36.8 C) 97.9 F (36.6 C)  TempSrc:    Oral  SpO2:  93% 93%   Weight: 125.9 kg     Height: 5\' 11"  (1.803 m)      Physical Exam General: Well appearing man, NAD, overweight Heart: RRR; no murmur Lungs: CTAB Abdomen: soft, non-tender Extremities: No RLE edema; L BKA bandaged Dialysis Access: TDC in L chest (chronic)  Additional Objective Labs: Basic Metabolic Panel: Recent Labs  Lab 04/29/20 0428 04/29/20 0428 04/30/20 0022 05/01/20 0608 05/02/20 0345  NA 137   < > 132* 134* 135  K 4.7   < > 5.6* 4.8 4.7  CL 96*   < > 90* 93* 95*  CO2 27   < > 25 26 23   GLUCOSE 152*   < > 288* 150* 100*  BUN 31*   < > 52* 41* 54*  CREATININE 7.83*   < > 9.78* 8.21* 10.15*  CALCIUM 9.3   < > 9.0 8.7* 8.3*  PHOS 5.9*  --   --   --   --    < > = values in this interval not displayed.   Liver Function Tests: Recent Labs  Lab 04/30/20 0022 05/01/20 0608 05/02/20 0345  AST 15 13* 21  ALT 26 21 27   ALKPHOS 68 59 70  BILITOT 0.6 0.5 0.6  PROT 7.8 7.2 7.9  ALBUMIN 2.3* 2.1* 2.2*   CBC: Recent Labs  Lab 04/29/20 1016 04/29/20 1016 04/29/20 1737 04/29/20 1737 04/30/20 0022 04/30/20 2014 05/01/20 0608 05/01/20 1603 05/02/20 0345  WBC 12.6*   < > 13.0*   < > 16.2*  --  14.8*  --  16.2*  NEUTROABS  --   --  11.3*  --   --   --   --   --   --   HGB 8.4*   < > 8.4*   < > 7.5*   < > 7.7* 8.4* 8.5*  HCT 28.2*   < > 27.9*   < > 25.0*   < > 26.0* 27.5* 27.4*  MCV 87.6  --  86.1  --  86.5  --  87.5  --  87.5  PLT 361   < > 351   < > 350  --  348  --  365   < > =  values in this interval not displayed.   Blood Culture    Component Value Date/Time   SDES BLOOD RIGHT FOREARM 04/28/2020 1517   SPECREQUEST  04/28/2020 1517    BOTTLES DRAWN AEROBIC AND ANAEROBIC Blood Culture results may not be optimal due to an inadequate volume of blood received in culture bottles   CULT  04/28/2020 1517    NO GROWTH 4 DAYS Performed at [facility], [place] [address], [place], [place] [address]    REPTSTATUS PENDING 04/28/2020 1517   Studies/Results: DG Abd 1 View  Result Date: 05/01/2020 CLINICAL DATA:  Abdominal pain, distention EXAM: ABDOMEN - 1 VIEW COMPARISON:  None.  FINDINGS: Nonobstructive bowel gas pattern. No organomegaly, suspicious calcification or free air. No acute bony abnormality. IMPRESSION: No acute findings. Electronically Signed   By: Rolm Baptise M.D.   On: 05/01/2020 15:38   CT HEAD WO CONTRAST  Result Date: 05/01/2020 CLINICAL DATA:  Diplopia; dizziness, nonspecific; headache, classic migraine. Additional provided: Headache, dizziness, blurred vision in both eyes beginning this morning. EXAM: CT HEAD WITHOUT CONTRAST TECHNIQUE: Contiguous axial images were obtained from the base of the skull through the vertex without intravenous contrast. COMPARISON:  Prior head CT 02/07/2010 FINDINGS: Brain: Mild generalized parenchymal atrophy, progressed as compared to the head CT of 02/07/2010. Redemonstrated chronic cortically based right occipital lobe infarct. Mild ill-defined hypoattenuation within the cerebral white matter is nonspecific, but consistent with chronic small vessel ischemic disease. There is no acute intracranial hemorrhage. No acute demarcated cortical infarct is identified. No extra-axial fluid collection. No evidence of intracranial mass. No midline shift. Vascular: No hyperdense vessel.  Atherosclerotic calcifications. Skull: Normal. Negative for fracture or focal lesion. Sinuses/Orbits: Visualized orbits show no acute finding. Mild  scattered paranasal sinus mucosal thickening, most notably ethmoidal. No significant mastoid effusion. IMPRESSION: No CT evidence of acute intracranial abnormality. Redemonstrated chronic cortically based right occipital lobe infarct. Mild chronic small vessel ischemic changes within the cerebral white matter. Mild generalized parenchymal atrophy, progressed as compared to the head CT of 02/07/2010. Paranasal sinus mucosal thickening, most notably ethmoidal. Electronically Signed   By: Kellie Simmering DO   On: 05/01/2020 14:41   VAS [place] LOWER EXTREMITY SAPHENOUS VEIN MAPPING  Result Date: 05/01/2020 LOWER EXTREMITY VEIN MAPPING Indications: Pre-op History:     History of PAD; patient is pre-operative for lower extremity bypass              graft.  Comparison Study: No prior study Performing Technologist: Sharion Dove RVS  Examination Guidelines: A complete evaluation includes B-mode imaging, spectral Doppler, color Doppler, and power Doppler as needed of all accessible portions of each vessel. Bilateral testing is considered an integral part of a complete examination. Limited examinations for reoccurring indications may be performed as noted. +--------------+-----------+--------------------+--------------+---------------+  RT Diameter  RT Findings        GSV          LT Diameter    LT Findings        (cm)                                         (cm)                     +--------------+-----------+--------------------+--------------+---------------+      0.54                   Saphenofemoral        0.77                                                    Junction                                    +--------------+-----------+--------------------+--------------+---------------+      0.42  Proximal thigh        0.46        tortuous     +--------------+-----------+--------------------+--------------+---------------+      0.41      branching      Mid thigh           0.27         tortuous     +--------------+-----------+--------------------+--------------+---------------+      0.25                    Distal thigh         0.33        tortuous     +--------------+-----------+--------------------+--------------+---------------+      0.26                        Knee             0.23        tortuous     +--------------+-----------+--------------------+--------------+---------------+      0.32                     Prox calf           0.27      branching and                                                                tortuous     +--------------+-----------+--------------------+--------------+---------------+      0.31      branching       Mid calf           0.19        tortuous     +--------------+-----------+--------------------+--------------+---------------+      0.29                    Distal calf          0.15      branching and                                                                tortuous     +--------------+-----------+--------------------+--------------+---------------+      0.29      branching        Ankle             0.21                     +--------------+-----------+--------------------+--------------+---------------+ Diagnosing physician: Harold Barban MD Electronically signed by Harold Barban MD on 05/01/2020 at 4:58:39 PM.    Final    Medications: . sodium chloride    . sodium chloride 10 mL/hr at 05/02/20 0945  .  ceFAZolin (ANCEF) IV    . magnesium sulfate bolus IVPB    . piperacillin-tazobactam (ZOSYN)  IV 2.25 g (05/02/20 0639)  . vancomycin     . sodium chloride   Intravenous Once  . sodium chloride   Intravenous Once  . vitamin C  1,000 mg Oral BID  . aspirin  81 mg Oral  QODAY  . atorvastatin  40 mg Oral QHS  . Chlorhexidine Gluconate Cloth  6 each Topical Q0600  . cinacalcet  90 mg Oral Q supper  . darbepoetin (ARANESP) injection - DIALYSIS  40 mcg Intravenous Q Thu-HD  . [START ON  05/03/2020] docusate sodium  100 mg Oral Daily  . doxercalciferol  6 mcg Intravenous Q T,Th,Sa-HD  . insulin aspart  0-9 Units Subcutaneous TID WC  . insulin glargine  15 Units Subcutaneous QHS  . multivitamin  1 tablet Oral QHS  . pantoprazole  40 mg Oral Daily  . polyethylene glycol  17 g Oral Daily  . senna  1 tablet Oral Daily  . sevelamer carbonate  4.8 g Oral TID WC  . sodium chloride flush  3 mL Intravenous Q12H  . umeclidinium bromide  1 puff Inhalation Daily    Dialysis Orders: TTS @ [place] 4.5h  400/800  126kg  2/2.25 bath  Hep 12000 bolus  TDC - Mircera52mcg q 2wks - just reordered/not yet given,last dose 10/20/19 - Hectorol40mcg IV qHD   Assessment/Plan: 1. PAD with L leg ischemia + gangrene: X-ray w/o signs of osteomyelitis, on empiric Vanc + Zosyn. VVS consulted -> undewent BLE arteriogram 8/6 showing severe bilateral PAD below the knees. 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 Donnetta Hutching. Being evaluated for CIR. 2. ESRD: Continue HD per TTS sched - for HD tomorrow. Holding heparin s/p surgery. 3. Hypotension/volume: Chronic low BP - Coreg on hold. EDW will be lower on discharge. 4. Anemiaof ESRD: Hgb 8.5 but anticipating post-op drop. Aranesp 64mcg given with HD 8/5, will increase next dose to 177mcg.No IV iron due to allergy.  5. Secondary Hyperparathyroidism: Ca at goal.Phos elevated.Continue VDRA, sensiparand binders. 6.  Constipation: Given mag citrate + PO dulcolax  8/8. 7. Nutrition- Renal diet w/fluid restrictions.  8. DMT2 -per primary 9. CAD s/p CABG - per primary 10. Hx CVA 11. COPD 66. OSA - on CPAP  Veneta Penton, PA-C 05/02/2020, 1:39 PM  [employer]

## 2020-05-02 NOTE — Anesthesia Procedure Notes (Signed)
Anesthesia Regional Block: Adductor canal block   Pre-Anesthetic Checklist: ,, timeout performed, Correct Patient, Correct Site, Correct Laterality, Correct Procedure, Correct Position, site marked, Risks and benefits discussed,  Surgical consent,  Pre-op evaluation,  At surgeon's request and post-op pain management  Laterality: Left  Prep: Maximum Sterile Barrier Precautions used, chloraprep       Needles:  Injection technique: Single-shot  Needle Type: Echogenic Stimulator Needle     Needle Length: 9cm  Needle Gauge: 22     Additional Needles:   Procedures:,,,, ultrasound used (permanent image in chart),,,,  Narrative:  Start time: 05/02/2020 9:30 AM End time: 05/02/2020 9:35 AM Injection made incrementally with aspirations every 5 mL.  Performed by: Personally  Anesthesiologist: Pervis Hocking, DO  Additional Notes: Monitors applied. No increased pain on injection. No increased resistance to injection. Injection made in 5cc increments. Good needle visualization. Patient tolerated procedure well.

## 2020-05-02 NOTE — Anesthesia Preprocedure Evaluation (Addendum)
Anesthesia Evaluation  Patient identified by MRN, date of birth, ID band Patient awake    Reviewed: Allergy & Precautions, NPO status , Patient's Chart, lab work & pertinent test results  Airway Mallampati: II  TM Distance: >3 FB Neck ROM: Full    Dental  (+) Teeth Intact, Dental Advisory Given, Poor Dentition,    Pulmonary asthma , sleep apnea and Continuous Positive Airway Pressure Ventilation , COPD,  COPD inhaler, former smoker,  OSA- does not use CPAP consistently Former smoker, quit 1988- 15 pack year history  Last used inhalers 1 week ago   Pulmonary exam normal breath sounds clear to auscultation       Cardiovascular hypertension, Pt. on medications + CAD, + Past MI and +CHF (LVEF 38-75%, grade 2 diastolic dysfunction)  Normal cardiovascular exam Rhythm:Regular Rate:Normal  Last echo 2014: - Left ventricle: The cavity size was normal. There was  moderate concentric hypertrophy. Systolic function was  mildly to moderately reduced. The estimated ejection  fraction was in the range of 40% to 45%. Probable moderate  hypokinesis of the lateral myocardium. Features are  consistent with a pseudonormal left ventricular filling  pattern, with concomitant abnormal relaxation and  increased filling pressure (grade 2 diastolic  dysfunction). Doppler parameters are consistent with  elevated mean left atrial filling pressure.  - Mitral valve: Calcified annulus. Mildly thickened leaflets  .  - Left atrium: The atrium was moderately dilated.  - Right ventricle: The cavity size was mildly dilated.  Systolic function was mildly reduced.  - Right atrium: The atrium was mildly dilated.  - Pericardium, extracardiac: A trivial pericardial effusion  was identified.     Neuro/Psych  Headaches, PSYCHIATRIC DISORDERS Anxiety Depression CVA 2007- no residual weakness per pt CVA, No Residual Symptoms     GI/Hepatic negative GI ROS, Neg liver ROS,   Endo/Other  diabetes, Well Controlled, Type 2, Insulin DependentObesity BMI 39 Last a1c 7.9  Renal/GU ESRF and DialysisRenal diseaseLast HD Saturday   negative genitourinary   Musculoskeletal  (+) Arthritis , Osteoarthritis,  Ischemia left foot   Abdominal   Peds  Hematology  (+) Blood dyscrasia, anemia , hct 27.4   Anesthesia Other Findings   Reproductive/Obstetrics negative OB ROS                            Anesthesia Physical Anesthesia Plan  ASA: IV  Anesthesia Plan: General and Regional   Post-op Pain Management:  Regional for Post-op pain   Induction: Intravenous  PONV Risk Score and Plan: 2 and Ondansetron, Dexamethasone and Treatment may vary due to age or medical condition  Airway Management Planned: LMA  Additional Equipment: None  Intra-op Plan:   Post-operative Plan: Extubation in OR  Informed Consent: I have reviewed the patients History and Physical, chart, labs and discussed the procedure including the risks, benefits and alternatives for the proposed anesthesia with the patient or authorized representative who has indicated his/her understanding and acceptance.     Dental advisory given  Plan Discussed with: CRNA  Anesthesia Plan Comments:         Anesthesia Quick Evaluation

## 2020-05-02 NOTE — Progress Notes (Signed)
Patient ID: Jonathon Bailey, male   DOB: [birth year], 64 y.o.   MRN: [id number] Comfortable this morning. Events of last night noted. Completely intact neurologically.  Has had progressive dry gangrene in his left foot with second and third toe and now the dorsum of his foot and the first metatarsal head. Again explained he has a nonfunctional foot with everything debrided that is currently nonviable. Also has no reasonable revascularization options.   He is not having a great deal of pain. He has dry gangrene. I did explain to him that there is no urgency for amputation but no alternatives. He is comfortable with the decision to proceed with below-knee amputation. I went to the specifics as far as mid calf amputation so he can be fitted with prosthesis. We will proceed with surgery today

## 2020-05-02 NOTE — Progress Notes (Signed)
Ring, bracelet, phone, and phone charger placed in bedside dresser prior to OR. Pt aware.

## 2020-05-02 NOTE — Progress Notes (Signed)
PT Cancellation Note  Patient Details Name: CLARK CUFF MRN: [id number] DOB: [birth year]   Cancelled Treatment:    Reason Eval/Treat Not Completed: Patient at procedure or test/unavailable (LEft BKA this am.  In Echo currently.  Will return tomorrow.)   Denice Paradise 05/02/2020, 2:57 PM Jacqui Headen W,PT Acute Rehabilitation Services Pager:  [phone]  Office:  [phone]

## 2020-05-02 NOTE — Progress Notes (Signed)
Family Medicine Teaching Service Daily Progress Note Intern Pager: [phone]  Patient name: Jonathon Bailey Medical record number: [id number] Date of birth: 05/06/1956 Age: 64 y.o. Gender: male  Primary Care Provider: [facility] Consultants: Vascular, Ortho Code Status: Full code   Pt Overview and Major Events to Date:  Admitted  8/5  Abdominal aortogram and leg arteriogram: 8/6 Left below knee amputation: 8/9  Assessment and Plan: Jonathon Bailey is a 64 y.o. male presenting with gangrene of left toes. PMH is significant for CAD s/p 5 stents, CVA x2, MI x3, T2DM insulin dependent, ESRD , macular edema, HLD, HTN, HFrEF.  Severe PAD with ischemic left foot  Left toe dry gangrene: Stable.  Patient had left below knee amputation this morning by vascular surgery - vascular not in office this pm to answer question regarding continuing antibiotics. Will try to reach out again tomorrow and will Continue IV Zosyn q8 (8/5-) and  IV vancomycin with HD (8/5-) for now.  -Tylenol q6 scheduled for mild pain, oxycodone 5 mg every 6 as needed for moderate/severe -Monitor CBC  Acute on chronic anemia of CKD: Unchanged. Hgb 8.5 this am. Hgb 7.9 pm. Will monitor  -Ordered ferritin/iron panel: Fe 9, TIBC 157 - consider Feraheme 510mg  -Monitor CBC  ESRD: Chronic, stable. TThSat schedule. -Nephrology on board, appreciate assistance -Monitor electrolytes  Insulin-dependent T2DM: Chronic, stable. A1c 7.9.  Takes Levemir 75 U BID w/ novolog 5 BID PRN.  -Continue Lantus 15 units -Hold home Levemir while receiving Lantus as above -Sensitive SSI -Monitor CBGs with meals and at bedtime  Constipation: Chronic, stable. Receiving opioids. -Continue MiraLax scheduled daily  Hypertension: Chronic, stable. SBP 88-110s. -Restart home Coreg at reduced dose, 3.125mg  BID from 6.25 -Continue to hold home hydralazine d/t lower pressure -Monitor BP  HFrEF: Chronic, stable.  EF  45% in 2013.  Likely in setting of above.  [facility] -Nephrology for HD/volume control -Continue CAD medications as above -Monitor fluid status  Esophageal dysphagia: Chronic, stable. Evaluated outpatient at [facility], structural abnormality present however does well with regular diet. -Monitor, maintain renal diet as is  CAD, s/p MI and CABG: Chronic, stable.  [facility]  No current anginal symptoms. -Continue home ASA 81 mg every other day -Continue home statin -Home Coreg as above  Previous CVA: Chronic, stable. No significant residual deficits.  -ASA and statin as above  Hyperlipidemia: Chronic, stable. -Continue home atorvastatin 40 mg  FEN/GI: Renal carb modified  Prophylaxis: Heparin  Disposition: Pending post op care   Subjective:   No acute events overnight. Saw patient after his BKA. He stated he was doing ok and did not express any concerns.    Objective: Temp:  [98.6 F (37 C)-99.5 F (37.5 C)] 99 F (37.2 C) (08/09 0815) Pulse Rate:  [68-84] 82 (08/09 0624) Resp:  [10-21] 17 (08/09 0815) BP: (90-115)/(47-88) 109/62 (08/09 0815) SpO2:  [95 %-98 %] 98 % (08/09 0815) Weight:  [125.9 kg] 125.9 kg (08/09 0846) Physical Exam: General: in no acute distress  Cardiovascular: RRR. No murmurs  Respiratory: CTA bilaterally. No wheezes, rales or rhonchi  Abdomen: soft, non tender  Extremities: s/p L BKA. Stump wrapped. R pedal pulse 2+. No edema   Laboratory: Recent Labs  Lab 04/30/20 0022 04/30/20 2014 05/01/20 0608 05/01/20 1603 05/02/20 0345  WBC 16.2*  --  14.8*  --  16.2*  HGB 7.5*   < > 7.7* 8.4* 8.5*  HCT 25.0*   < > 26.0* 27.5* 27.4*  PLT 350  --  348  --  365   < > = values in this interval not displayed.   Recent Labs  Lab 04/30/20 0022 05/01/20 0608 05/02/20 0345  NA 132* 134* 135  K 5.6* 4.8 4.7  CL 90* 93* 95*  CO2 25 26 23   BUN 52* 41* 54*  CREATININE 9.78* 8.21* 10.15*  CALCIUM 9.0 8.7* 8.3*  PROT 7.8 7.2 7.9   BILITOT 0.6 0.5 0.6  ALKPHOS 68 59 70  ALT 26 21 27   AST 15 13* 21  GLUCOSE 288* 150* 100*     Imaging/Diagnostic Tests:  Echo: EF 50-55%. Paradoxical septal motion consistent with post-op status. Mitral annular calcification. Mild to moderate aortic stenosis.   Jonathon Stanley, DO 05/02/2020, 9:50 AM PGY-1, [place] Intern pager: [phone], text pages welcome

## 2020-05-02 NOTE — Anesthesia Procedure Notes (Signed)
Anesthesia Regional Block: Popliteal block   Pre-Anesthetic Checklist: ,, timeout performed, Correct Patient, Correct Site, Correct Laterality, Correct Procedure, Correct Position, site marked, Risks and benefits discussed,  Surgical consent,  Pre-op evaluation,  At surgeon's request and post-op pain management  Laterality: Left  Prep: Maximum Sterile Barrier Precautions used, chloraprep       Needles:  Injection technique: Single-shot  Needle Type: Echogenic Stimulator Needle     Needle Length: 9cm  Needle Gauge: 22     Additional Needles:   Procedures:,,,, ultrasound used (permanent image in chart),,,,  Narrative:  Start time: 05/02/2020 9:35 AM End time: 05/02/2020 9:40 AM Injection made incrementally with aspirations every 5 mL.  Performed by: Personally  Anesthesiologist: Pervis Hocking, DO  Additional Notes: Monitors applied. No increased pain on injection. No increased resistance to injection. Injection made in 5cc increments. Good needle visualization. Patient tolerated procedure well.

## 2020-05-02 NOTE — Anesthesia Postprocedure Evaluation (Signed)
Anesthesia Post Note  Patient: Jonathon Bailey  Procedure(s) Performed: AMPUTATION BELOW KNEE (Left Knee)     Patient location during evaluation: PACU Anesthesia Type: Regional and General Level of consciousness: awake and alert, oriented and patient cooperative Pain management: pain level controlled Vital Signs Assessment: post-procedure vital signs reviewed and stable Respiratory status: spontaneous breathing, nonlabored ventilation and respiratory function stable Cardiovascular status: blood pressure returned to baseline and stable Postop Assessment: no apparent nausea or vomiting Anesthetic complications: no   No complications documented.  Last Vitals:  Vitals:   05/02/20 0815 05/02/20 1127  BP: 109/62 (!) 105/55  Pulse:  81  Resp: 17 19  Temp: 37.2 C 37.2 C  SpO2: 98% 93%    Last Pain:  Vitals:   05/02/20 1127  TempSrc:   PainSc: 0-No pain                 Pervis Hocking

## 2020-05-02 NOTE — Plan of Care (Signed)

## 2020-05-02 NOTE — Progress Notes (Signed)
Inpatient Rehab Admissions:  Inpatient Rehab Consult received.  Pt POD #0, therapy evaluations pending.  Will f/u after therapy evaluations based on recommendations.   Signed: Shann Medal, PT, DPT Admissions Coordinator [phone] 05/02/20  12:46 PM

## 2020-05-02 NOTE — Interval H&P Note (Signed)
History and Physical Interval Note:  05/02/2020 9:19 AM  Jonathon Bailey  has presented today for surgery, with the diagnosis of ISCHEMIA LEFT FOOT.  The various methods of treatment have been discussed with the patient and family. After consideration of risks, benefits and other options for treatment, the patient has consented to  Procedure(s): AMPUTATION BELOW KNEE (Left) as a surgical intervention.  The patient's history has been reviewed, patient examined, no change in status, stable for surgery.  I have reviewed the patient's chart and labs.  Questions were answered to the patient's satisfaction.     Curt Jews

## 2020-05-02 NOTE — Anesthesia Procedure Notes (Signed)
Procedure Name: LMA Insertion Date/Time: 05/02/2020 9:57 AM Performed by: Darletta Moll, CRNA Pre-anesthesia Checklist: Patient identified, Emergency Drugs available, Suction available and Patient being monitored Patient Re-evaluated:Patient Re-evaluated prior to induction Oxygen Delivery Method: Circle system utilized Preoxygenation: Pre-oxygenation with 100% oxygen Induction Type: IV induction Ventilation: Mask ventilation without difficulty LMA: LMA inserted LMA Size: 5.0 Number of attempts: 1 Placement Confirmation: positive ETCO2,  breath sounds checked- equal and bilateral and CO2 detector Tube secured with: Tape Dental Injury: Teeth and Oropharynx as per pre-operative assessment

## 2020-05-03 ENCOUNTER — Encounter (HOSPITAL_COMMUNITY): Payer: Self-pay | Admitting: Vascular Surgery

## 2020-05-03 DIAGNOSIS — N186 End stage renal disease: Secondary | ICD-10-CM | POA: Diagnosis not present

## 2020-05-03 DIAGNOSIS — Z89512 Acquired absence of left leg below knee: Secondary | ICD-10-CM | POA: Diagnosis not present

## 2020-05-03 DIAGNOSIS — I998 Other disorder of circulatory system: Secondary | ICD-10-CM | POA: Diagnosis not present

## 2020-05-03 DIAGNOSIS — E118 Type 2 diabetes mellitus with unspecified complications: Secondary | ICD-10-CM | POA: Diagnosis not present

## 2020-05-03 LAB — COMPREHENSIVE METABOLIC PANEL
ALT: 25 U/L (ref 0–44)
AST: 27 U/L (ref 15–41)
Albumin: 2.1 g/dL — ABNORMAL LOW (ref 3.5–5.0)
Alkaline Phosphatase: 66 U/L (ref 38–126)
Anion gap: 19 — ABNORMAL HIGH (ref 5–15)
BUN: 70 mg/dL — ABNORMAL HIGH (ref 8–23)
CO2: 23 mmol/L (ref 22–32)
Calcium: 8.3 mg/dL — ABNORMAL LOW (ref 8.9–10.3)
Chloride: 92 mmol/L — ABNORMAL LOW (ref 98–111)
Creatinine, Ser: 11.64 mg/dL — ABNORMAL HIGH (ref 0.61–1.24)
GFR calc Af Amer: 5 mL/min — ABNORMAL LOW (ref >60)
GFR calc non Af Amer: 4 mL/min — ABNORMAL LOW (ref >60)
Glucose, Bld: 226 mg/dL — ABNORMAL HIGH (ref 70–99)
Potassium: 6.1 mmol/L — ABNORMAL HIGH (ref 3.5–5.1)
Sodium: 134 mmol/L — ABNORMAL LOW (ref 135–145)
Total Bilirubin: 0.7 mg/dL (ref 0.3–1.2)
Total Protein: 7.5 g/dL (ref 6.5–8.1)

## 2020-05-03 LAB — CBC
HCT: 25.6 % — ABNORMAL LOW (ref 39.0–52.0)
Hemoglobin: 7.9 g/dL — ABNORMAL LOW (ref 13.0–17.0)
MCH: 27.1 pg (ref 26.0–34.0)
MCHC: 30.9 g/dL (ref 30.0–36.0)
MCV: 87.7 fL (ref 80.0–100.0)
Platelets: 342 10*3/uL (ref 150–400)
RBC: 2.92 MIL/uL — ABNORMAL LOW (ref 4.22–5.81)
RDW: 17 % — ABNORMAL HIGH (ref 11.5–15.5)
WBC: 13.1 10*3/uL — ABNORMAL HIGH (ref 4.0–10.5)
nRBC: 0 % (ref 0.0–0.2)

## 2020-05-03 LAB — CULTURE, BLOOD (ROUTINE X 2)
Culture: NO GROWTH
Culture: NO GROWTH

## 2020-05-03 LAB — GLUCOSE, CAPILLARY
Glucose-Capillary: 205 mg/dL — ABNORMAL HIGH (ref 70–99)
Glucose-Capillary: 131 mg/dL — ABNORMAL HIGH (ref 70–99)
Glucose-Capillary: 154 mg/dL — ABNORMAL HIGH (ref 70–99)
Glucose-Capillary: 153 mg/dL — ABNORMAL HIGH (ref 70–99)

## 2020-05-03 LAB — PHOSPHORUS: Phosphorus: 7.1 mg/dL — ABNORMAL HIGH (ref 2.5–4.6)

## 2020-05-03 MED ORDER — OXYCODONE HCL 5 MG PO TABS
5.0000 mg | ORAL_TABLET | ORAL | Status: DC | PRN
  Administered 2020-05-03: 10 mg via ORAL
  Administered 2020-05-04: 5 mg via ORAL
  Administered 2020-05-04 – 2020-05-05 (×5): 10 mg via ORAL
  Administered 2020-05-06: 5 mg via ORAL
  Administered 2020-05-06 (×3): 10 mg via ORAL
  Filled 2020-05-03 (×2): qty 2
  Filled 2020-05-03: qty 1
  Filled 2020-05-03 (×8): qty 2

## 2020-05-03 MED ORDER — DOXERCALCIFEROL 4 MCG/2ML IV SOLN
INTRAVENOUS | Status: AC
  Administered 2020-05-03: 6 ug via INTRAVENOUS
  Filled 2020-05-03: qty 4

## 2020-05-03 MED ORDER — HYDROMORPHONE HCL 1 MG/ML IJ SOLN: 1.0000 mg | INTRAMUSCULAR | Status: DC | PRN

## 2020-05-03 MED ORDER — OXYCODONE HCL 5 MG PO TABS
ORAL_TABLET | ORAL | Status: AC
  Filled 2020-05-03: qty 1

## 2020-05-03 MED ORDER — INSULIN GLARGINE 100 UNIT/ML ~~LOC~~ SOLN
20.0000 [IU] | Freq: Every day | SUBCUTANEOUS | Status: DC
  Administered 2020-05-04 – 2020-05-05 (×3): 20 [IU] via SUBCUTANEOUS
  Filled 2020-05-03 (×5): qty 0.2

## 2020-05-03 MED ORDER — FERROUS GLUCONATE 324 (38 FE) MG PO TABS
324.0000 mg | ORAL_TABLET | Freq: Two times a day (BID) | ORAL | Status: DC
  Administered 2020-05-03 – 2020-05-06 (×6): 324 mg via ORAL
  Filled 2020-05-03 (×7): qty 1

## 2020-05-03 MED ORDER — HEPARIN SODIUM (PORCINE) 1000 UNIT/ML IJ SOLN
INTRAMUSCULAR | Status: AC
  Filled 2020-05-03: qty 1

## 2020-05-03 MED ORDER — INSULIN ASPART 100 UNIT/ML ~~LOC~~ SOLN
0.0000 [IU] | Freq: Three times a day (TID) | SUBCUTANEOUS | Status: DC
  Administered 2020-05-03: 3 [IU] via SUBCUTANEOUS
  Administered 2020-05-03 – 2020-05-04 (×3): 2 [IU] via SUBCUTANEOUS

## 2020-05-03 MED ORDER — VANCOMYCIN HCL IN DEXTROSE 1-5 GM/200ML-% IV SOLN
INTRAVENOUS | Status: AC
  Administered 2020-05-03: 1000 mg via INTRAVENOUS
  Filled 2020-05-03: qty 200

## 2020-05-03 MED ORDER — HYDROMORPHONE HCL 1 MG/ML IJ SOLN
1.0000 mg | INTRAMUSCULAR | Status: DC | PRN
  Administered 2020-05-03 – 2020-05-06 (×8): 1 mg via INTRAVENOUS
  Filled 2020-05-03 (×8): qty 1

## 2020-05-03 NOTE — Progress Notes (Signed)
OT Cancellation    05/03/20 0800  OT Visit Information  Last OT Received On 05/03/20  Reason Eval/Treat Not Completed Patient at procedure or test/ unavailable (HD)  Maurie Boettcher, OT/L   Acute OT Clinical Specialist [place] Pager [phone] Office [phone]

## 2020-05-03 NOTE — Progress Notes (Signed)
OT Cancellation Note  Patient Details Name: Jonathon Bailey MRN: [id number] DOB: 03/16/1956   Cancelled Treatment:    Reason Eval/Treat Not Completed: (P) Patient at procedure or test/ unavailable. Pt currently off the unit at HD. OT will return as time allows and pt is appropriate.   [facility] OTR/L Acute Rehabilitation Services Office: [place] 05/03/2020, 9:03 AM

## 2020-05-03 NOTE — Progress Notes (Signed)
Family Medicine Teaching Service Daily Progress Note Intern Pager: [phone]  Patient name: Jonathon Bailey Medical record number: [id number] Date of birth: [birth year] Age: 64 y.o. Gender: male  Primary Care Provider: [facility] Consultants: Vascular, Ortho Code Status: Full   Pt Overview and Major Events to Date:  Admitted 8/5  Abdominal aortogram and leg arteriogram: 8/6 Left below knee amputation: 8/9  Assessment and Plan: Jonathon Bailey is a 64 y.o. male presenting with gangrene of left toes. PMH is significant for CAD s/p 5 stents, CVA x2, MI x3, T2DM insulin dependent, ESRD , macular edema, HLD, HTN, HFrEF  Severe PAD with ischemic left foot  Left toe dry gangrene: Stable.  Patient had left below knee amputation this morning by vascular surgery - Per vascular surgery, continuing IV abx for 48 hours and then discontinuing (IV Zosynq8 (8/5-) and  IV vancomycinwith HD (8/5-) )  Vanc d/c, zosyn d/c tomorrow.  - keep L leg elevated to help with swelling - bandage to be removed tomorrow  - Tylenolq6scheduledfor mild pain, oxycodone 5 mg every 6 as needed for moderate/severe. Dilaudid 1mg  q3 hrs prn for moderate/severe pain.  - Monitor CBC  Acute on chronic anemia of LKG:[id number]. Hgb 7.9 this am. Will monitor   ESRD: Chronic, stable. TThSatschedule. -Nephrology on board, appreciate assistance -Monitor electrolytes  Insulin-dependentT2DM: Chronic, stable. A1c 7.9. Takes Levemir 75U BID w/ novolog 5 BID PRN. -Lantus 20 units tonight -Hold home Levemir while receiving Lantus as above -Sensitive SSI -Monitor CBGs with meals and at bedtime  Constipation: Chronic, stable. Receiving opioids. - dulcolax 10pmg prn - colace 100mg  qd  Hypertension: Chronic, stable. [id number]. - Per nephro, Coreg on hold  -Continue to hold home hydralazined/tlower pressure -Monitor BP  HFrEF: Chronic, stable. EF 45% in 2013. Likely in setting of  above. [facility] -Nephrology for HD/volume control -Continue CAD medications as above -Monitor fluid status  Esophageal dysphagia: Chronic, stable. [facility],structural abnormality present however does well with regular diet. -Monitor, maintain renal diet as is  CAD, s/p MI and CABG: Chronic, stable.  [facility]. No current anginal symptoms. -Continue home ASA 81 mg every other day -Continue home statin -Coreg on hold   Previous CVA: Chronic, stable. No significant residual deficits. -ASA and statin as above  Hyperlipidemia: Chronic, stable. -Continue home atorvastatin 40 mg  FEN/GI:Renal carb modified  Prophylaxis: Heparin  Disposition:Pending post op care    Subjective:  No acute events overnight. Patient was receiving HD when I saw him. He was tired when I assessed him   Objective: Temp:  [97.9 F (36.6 C)-99 F (37.2 C)] 98 F (36.7 C) (08/10 0439) Pulse Rate:  [71-81] 73 (08/10 0439) Resp:  [14-21] 20 (08/10 0439) BP: (101-127)/(55-87) 127/72 (08/10 0439) SpO2:  [93 %-98 %] 98 % (08/10 0439) Weight:  [125.3 kg-125.9 kg] 125.3 kg (08/10 0439) Physical Exam: General: obese man, currently in dialysis. In no acute distress  Cardiovascular: RRR, no murmurs, rubs, or gallops  Respiratory: CTA bilaterally. No wheezes, rales, or rhonchi  Abdomen: soft, non tender  Extremities: L stump wrapped with bandage. R distal pulse 2+. No edema   Laboratory: Recent Labs  Lab 05/01/20 0608 05/01/20 1603 05/02/20 0345 05/02/20 1644 05/03/20 0411  WBC 14.8*  --  16.2*  --  13.1*  HGB 7.7*   < > 8.5* 7.9* 7.9*  HCT 26.0*   < > 27.4* 26.1* 25.6*  PLT 348  --  365  --  342   < > =  values in this interval not displayed.   Recent Labs  Lab 05/01/20 0608 05/02/20 0345 05/03/20 0411  NA 134* 135 134*  K 4.8 4.7 6.1*  CL 93* 95* 92*  CO2 26 23 23   BUN 41* 54* 70*  CREATININE 8.21* 10.15* 11.64*  CALCIUM 8.7* 8.3* 8.3*  PROT 7.2  7.9 7.5  BILITOT 0.5 0.6 0.7  ALKPHOS 59 70 66  ALT 21 27 25   AST 13* 21 27  GLUCOSE 150* 100* 226*     Imaging/Diagnostic Tests:  None recent   Theone Stanley, DO 05/03/2020, 7:56 AM PGY-1, [place] Intern pager: [phone], text pages welcome

## 2020-05-03 NOTE — Progress Notes (Signed)
[facility] Progress Note   Subjective:  Seen on HD - 2L UF goal and tolerating. Some leg pain. No CP or dyspnea.  Objective Vitals:   05/03/20 0652 05/03/20 0700 05/03/20 0730 05/03/20 0800  BP: 127/63 (!) 88/50 (!) 119/59 128/68  Pulse: 64 75 70 72  Resp:   (!) 21 (!) 21  Temp:      TempSrc:      SpO2:   100% 100%  Weight:      Height:       Physical Exam General: Well appearing man, NAD, overweight Heart: RRR; no murmur Lungs: CTAB Abdomen: soft, non-tender Extremities: No RLE edema; L BKA bandaged Dialysis Access: TDC in L chest (chronic)  Additional Objective Labs: Basic Metabolic Panel: Recent Labs  Lab 04/29/20 0428 04/30/20 0022 05/01/20 0608 05/02/20 0345 05/03/20 0411  NA 137   < > 134* 135 134*  K 4.7   < > 4.8 4.7 6.1*  CL 96*   < > 93* 95* 92*  CO2 27   < > 26 23 23   GLUCOSE 152*   < > 150* 100* 226*  BUN 31*   < > 41* 54* 70*  CREATININE 7.83*   < > 8.21* 10.15* 11.64*  CALCIUM 9.3   < > 8.7* 8.3* 8.3*  PHOS 5.9*  --   --   --   --    < > = values in this interval not displayed.   Liver Function Tests: Recent Labs  Lab 05/01/20 0608 05/02/20 0345 05/03/20 0411  AST 13* 21 27  ALT 21 27 25   ALKPHOS 59 70 66  BILITOT 0.5 0.6 0.7  PROT 7.2 7.9 7.5  ALBUMIN 2.1* 2.2* 2.1*   CBC: Recent Labs  Lab 04/29/20 1737 04/29/20 1737 04/30/20 0022 04/30/20 2014 05/01/20 0608 05/01/20 1603 05/02/20 0345 05/02/20 1644 05/03/20 0411  WBC 13.0*   < > 16.2*  --  14.8*  --  16.2*  --  13.1*  NEUTROABS 11.3*  --   --   --   --   --   --   --   --   HGB 8.4*   < > 7.5*   < > 7.7*   < > 8.5* 7.9* 7.9*  HCT 27.9*   < > 25.0*   < > 26.0*   < > 27.4* 26.1* 25.6*  MCV 86.1  --  86.5  --  87.5  --  87.5  --  87.7  PLT 351   < > 350  --  348  --  365  --  342   < > = values in this interval not displayed.   Blood Culture    Component Value Date/Time   SDES BLOOD RIGHT FOREARM 04/28/2020 1517   SPECREQUEST  04/28/2020 1517    BOTTLES  DRAWN AEROBIC AND ANAEROBIC Blood Culture results may not be optimal due to an inadequate volume of blood received in culture bottles   CULT  04/28/2020 1517    NO GROWTH 4 DAYS Performed at [facility], [place] [address] [place], [place] [address]    REPTSTATUS PENDING 04/28/2020 1517   Studies/Results: DG Abd 1 View  Result Date: 05/01/2020 CLINICAL DATA:  Abdominal pain, distention EXAM: ABDOMEN - 1 VIEW COMPARISON:  None. FINDINGS: Nonobstructive bowel gas pattern. No organomegaly, suspicious calcification or free air. No acute bony abnormality. IMPRESSION: No acute findings. Electronically Signed   By: Rolm Baptise M.D.   On: 05/01/2020 15:38  CT HEAD WO CONTRAST  Result Date: 05/01/2020 CLINICAL DATA:  Diplopia; dizziness, nonspecific; headache, classic migraine. Additional provided: Headache, dizziness, blurred vision in both eyes beginning this morning. EXAM: CT HEAD WITHOUT CONTRAST TECHNIQUE: Contiguous axial images were obtained from the base of the skull through the vertex without intravenous contrast. COMPARISON:  Prior head CT 02/07/2010 FINDINGS: Brain: Mild generalized parenchymal atrophy, progressed as compared to the head CT of 02/07/2010. Redemonstrated chronic cortically based right occipital lobe infarct. Mild ill-defined hypoattenuation within the cerebral white matter is nonspecific, but consistent with chronic small vessel ischemic disease. There is no acute intracranial hemorrhage. No acute demarcated cortical infarct is identified. No extra-axial fluid collection. No evidence of intracranial mass. No midline shift. Vascular: No hyperdense vessel.  Atherosclerotic calcifications. Skull: Normal. Negative for fracture or focal lesion. Sinuses/Orbits: Visualized orbits show no acute finding. Mild scattered paranasal sinus mucosal thickening, most notably ethmoidal. No significant mastoid effusion. IMPRESSION: No CT evidence of acute intracranial abnormality. Redemonstrated  chronic cortically based right occipital lobe infarct. Mild chronic small vessel ischemic changes within the cerebral white matter. Mild generalized parenchymal atrophy, progressed as compared to the head CT of 02/07/2010. Paranasal sinus mucosal thickening, most notably ethmoidal. Electronically Signed   By: Kellie Simmering DO   On: 05/01/2020 14:41   VAS [place] LOWER EXTREMITY SAPHENOUS VEIN MAPPING  Result Date: 05/01/2020 LOWER EXTREMITY VEIN MAPPING Indications: Pre-op History:     History of PAD; patient is pre-operative for lower extremity bypass              graft.  Comparison Study: No prior study Performing Technologist: Sharion Dove RVS  Examination Guidelines: A complete evaluation includes B-mode imaging, spectral Doppler, color Doppler, and power Doppler as needed of all accessible portions of each vessel. Bilateral testing is considered an integral part of a complete examination. Limited examinations for reoccurring indications may be performed as noted. +--------------+-----------+--------------------+--------------+---------------+  RT Diameter  RT Findings        GSV          LT Diameter    LT Findings        (cm)                                         (cm)                     +--------------+-----------+--------------------+--------------+---------------+      0.54                   Saphenofemoral        0.77                                                    Junction                                    +--------------+-----------+--------------------+--------------+---------------+      0.42                   Proximal thigh        0.46        tortuous     +--------------+-----------+--------------------+--------------+---------------+  0.41      branching      Mid thigh           0.27        tortuous     +--------------+-----------+--------------------+--------------+---------------+      0.25                    Distal thigh         0.33        tortuous      +--------------+-----------+--------------------+--------------+---------------+      0.26                        Knee             0.23        tortuous     +--------------+-----------+--------------------+--------------+---------------+      0.32                     Prox calf           0.27      branching and                                                                tortuous     +--------------+-----------+--------------------+--------------+---------------+      0.31      branching       Mid calf           0.19        tortuous     +--------------+-----------+--------------------+--------------+---------------+      0.29                    Distal calf          0.15      branching and                                                                tortuous     +--------------+-----------+--------------------+--------------+---------------+      0.29      branching        Ankle             0.21                     +--------------+-----------+--------------------+--------------+---------------+ Diagnosing physician: Harold Barban MD Electronically signed by Harold Barban MD on 05/01/2020 at 4:58:39 PM.    Final    ECHOCARDIOGRAM COMPLETE  Result Date: 05/02/2020    ECHOCARDIOGRAM REPORT   Patient Name:   IRVINE GLORIOSO Date of Exam: 05/02/2020 Medical Rec #:  [id number]          Height:       71.0 in Accession #:    [id number]         Weight:       277.5 lb Date of Birth:  Nov 17, 1955          BSA:          2.423 m Patient Age:    64 years  BP:           108/55 mmHg Patient Gender: M                  HR:           74 bpm. Exam Location:  Inpatient Procedure: 2D Echo, Color Doppler and Cardiac Doppler Indications:    I50.9* Heart failure (unspecified), Hypotension  History:        Patient has prior history of Echocardiogram examinations, most                 recent 10/14/2012. COPD; Risk Factors:Hypertension, Diabetes,                 Dyslipidemia and Sleep  Apnea.  Sonographer:    Raquel Sarna Senior RDCS Referring Phys: [phone] CARINA M BROWN IMPRESSIONS  1. Left ventricular ejection fraction, by estimation, is 50 to 55%. The left ventricle has low normal function. The left ventricle has no regional wall motion abnormalities. Left ventricular diastolic function could not be evaluated.  2. Right ventricular systolic function is mildly reduced. The right ventricular size is mildly enlarged. There is normal pulmonary artery systolic pressure. The estimated right ventricular systolic pressure is 80.9 mmHg.  3. Mild to moderate calcific mitral stenosis is present. The mitral valve is degenerative. No evidence of mitral valve regurgitation. Mild to moderate mitral stenosis. The mean mitral valve gradient is 4.5 mmHg with average heart rate of 72 bpm.  4. Mild to moderate aortic stenosis is present. Vmax 2.3 m/s, MG 12.5 mmHG, AVA 1.06 cm2, DI 0.34. Gradients lower than expected due to low SVi (21 cc/m2). The aortic valve is tricuspid. Aortic valve regurgitation is not visualized. Mild to moderate aortic valve stenosis.  5. The inferior vena cava is dilated in size with <50% respiratory variability, suggesting right atrial pressure of 15 mmHg. FINDINGS  Left Ventricle: Left ventricular ejection fraction, by estimation, is 50 to 55%. The left ventricle has low normal function. The left ventricle has no regional wall motion abnormalities. The left ventricular internal cavity size was normal in size. There is no left ventricular hypertrophy. Abnormal (paradoxical) septal motion consistent with post-operative status. Left ventricular diastolic function could not be evaluated due to mitral annular calcification (moderate or greater). Left ventricular diastolic function could not be evaluated. Right Ventricle: The right ventricular size is mildly enlarged. No increase in right ventricular wall thickness. Right ventricular systolic function is mildly reduced. There is normal pulmonary  artery systolic pressure. The tricuspid regurgitant velocity  is 2.27 m/s, and with an assumed right atrial pressure of 15 mmHg, the estimated right ventricular systolic pressure is 98.3 mmHg. Left Atrium: Left atrial size was normal in size. Right Atrium: Right atrial size was normal in size. Pericardium: Trivial pericardial effusion is present. Mitral Valve: Mild to moderate calcific mitral stenosis is present. The mitral valve is degenerative in appearance. Moderate mitral annular calcification. No evidence of mitral valve regurgitation. Mild to moderate mitral valve stenosis. The mean mitral valve gradient is 4.5 mmHg with average heart rate of 72 bpm. Tricuspid Valve: The tricuspid valve is grossly normal. Tricuspid valve regurgitation is not demonstrated. No evidence of tricuspid stenosis. Aortic Valve: Mild to moderate aortic stenosis is present. Vmax 2.3 m/s, MG 12.5 mmHG, AVA 1.06 cm2, DI 0.34. Gradients lower than expected due to low SVi (21 cc/m2). The aortic valve is tricuspid. . There is moderate thickening and moderate calcification of the aortic valve. Aortic valve regurgitation is not visualized. Mild  to moderate aortic stenosis is present. There is moderate thickening of the aortic valve. There is moderate calcification of the aortic valve. Aortic valve mean gradient measures 12.5 mmHg. Aortic valve peak gradient measures 21.5 mmHg. Aortic valve area, by VTI measures 1.06 cm. Pulmonic Valve: The pulmonic valve was grossly normal. Pulmonic valve regurgitation is not visualized. No evidence of pulmonic stenosis. Aorta: The aortic root is normal in size and structure. Venous: The inferior vena cava is dilated in size with less than 50% respiratory variability, suggesting right atrial pressure of 15 mmHg. IAS/Shunts: The atrial septum is grossly normal. Additional Comments: A venous catheter is visualized in the right atrium.  LEFT VENTRICLE PLAX 2D LVIDd:         4.90 cm  Diastology LVIDs:          3.60 cm  LV e' lateral:   8.38 cm/s LV PW:         1.00 cm  LV E/e' lateral: 14.7 LV IVS:        0.80 cm  LV e' medial:    5.44 cm/s LVOT diam:     2.00 cm  LV E/e' medial:  22.6 LV SV:         50 LV SV Index:   21 LVOT Area:     3.14 cm  RIGHT VENTRICLE RV S prime:     5.77 cm/s TAPSE (M-mode): 2.0 cm LEFT ATRIUM             Index       RIGHT ATRIUM           Index LA diam:        4.70 cm 1.94 cm/m  RA Area:     17.30 cm LA Vol (A2C):   71.6 ml 29.54 ml/m RA Volume:   40.10 ml  16.55 ml/m LA Vol (A4C):   85.7 ml 35.36 ml/m LA Biplane Vol: 78.8 ml 32.52 ml/m  AORTIC VALVE AV Area (Vmax):    0.98 cm AV Area (Vmean):   1.07 cm AV Area (VTI):     1.06 cm AV Vmax:           232.03 cm/s AV Vmean:          166.756 cm/s AV VTI:            0.476 m AV Peak Grad:      21.5 mmHg AV Mean Grad:      12.5 mmHg LVOT Vmax:         72.50 cm/s LVOT Vmean:        56.600 cm/s LVOT VTI:          0.160 m LVOT/AV VTI ratio: 0.34  AORTA Ao Root diam: 3.20 cm MITRAL VALVE                TRICUSPID VALVE MV Area (PHT): 2.50 cm     TR Peak grad:   20.6 mmHg MV Area VTI:   1.27 cm     TR Vmax:        227.00 cm/s MV Mean grad:  4.5 mmHg MV VTI:        0.40 m       SHUNTS MV Decel Time: 303 msec     Systemic VTI:  0.16 m MV E velocity: 123.00 cm/s  Systemic Diam: 2.00 cm MV A velocity: 110.00 cm/s MV E/A ratio:  1.12 Eleonore Chiquito MD Electronically signed by Eleonore Chiquito MD Signature Date/Time: 05/02/2020/4:26:00 PM  Final    Medications: . sodium chloride    . sodium chloride 10 mL/hr at 05/02/20 0945  . magnesium sulfate bolus IVPB    . piperacillin-tazobactam (ZOSYN)  IV 2.25 g (05/02/20 2230)  . vancomycin     . sodium chloride   Intravenous Once  . sodium chloride   Intravenous Once  . vitamin C  1,000 mg Oral BID  . aspirin  81 mg Oral QODAY  . atorvastatin  40 mg Oral QHS  . Chlorhexidine Gluconate Cloth  6 each Topical Q0600  . cinacalcet  90 mg Oral Q supper  . [START ON 05/05/2020] darbepoetin (ARANESP)  injection - DIALYSIS  100 mcg Intravenous Q Thu-HD  . docusate sodium  100 mg Oral Daily  . doxercalciferol  6 mcg Intravenous Q T,Th,Sa-HD  . heparin injection (subcutaneous)  5,000 Units Subcutaneous Q8H  . insulin aspart  0-9 Units Subcutaneous TID WC  . insulin glargine  15 Units Subcutaneous QHS  . melatonin  5 mg Oral QHS  . multivitamin  1 tablet Oral QHS  . pantoprazole  40 mg Oral Daily  . polyethylene glycol  17 g Oral Daily  . senna  1 tablet Oral Daily  . sevelamer carbonate  4.8 g Oral TID WC  . sodium chloride flush  3 mL Intravenous Q12H  . umeclidinium bromide  1 puff Inhalation Daily    Dialysis Orders: TTS @ [facility] 4.5h 400/800 126kg 2/2.25 bath Hep 12000 bolus TDC - Mircera10mcg q 2wks - just reordered/not yet given,last dose 10/20/19 - Hectorol64mcg IV qHD   Assessment/Plan: 1. PAD with L leg ischemia + gangrene: X-ray w/o signs of osteomyelitis, on empiric Vanc + Zosyn.VVS consulted -> undewent BLE arteriogram 8/6 showing severe bilateral PAD below the knees. 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 Donnetta Hutching. Being evaluated for CIR. 2. ESRD: Continue HD per TTS sched - HD today, 2K, no heparin. 3. Hypotension/volume: Chronic low BP - Coreg on hold. EDW will be lower on discharge. 4. Anemiaof ESRD: Hgb 7.9. Aranesp 63mcg given with HD 8/5, will increase next dose to 147mcg.No IV iron due to allergy.  5. Secondary Hyperparathyroidism: Ca at goal.Phos elevated.Continue VDRA, sensiparand binders. 6. Constipation: Given mag citrate + PO dulcolax  8/8. 7. Nutrition- Renal diet w/fluid restrictions.  8. DMT2 - Per primary 9. CAD s/p CABG - per primary 10. Hx CVA 11. COPD 31. OSA - on CPAP  Veneta Penton, PA-C 05/03/2020, 8:20 AM  [employer]

## 2020-05-03 NOTE — Progress Notes (Signed)
OT Cancellation Note  Patient Details Name: Jonathon Bailey MRN: [id number] DOB: 21-Oct-1955   Cancelled Treatment:    Reason Eval/Treat Not Completed: (P) Patient declined, no reason specified Pt recently returned from HD, pt asleep upon arrival. Requesting to hold off on therapy this date due to fatigue and pain recently under control, allowing pt to be comfortable and rest. Pt motivated to work with therapy and get stronger, requesting therapy return tomorrow. OT will return as time allows and pt is appropriate.   [facility] OTR/L Acute Rehabilitation Services Office: [place] 05/03/2020, 1:52 PM

## 2020-05-03 NOTE — Progress Notes (Signed)
PT Cancellation    05/03/20 1057  PT Visit Information  Last PT Received On 05/03/20  Reason Eval/Treat Not Completed Patient at procedure or test/ unavailable (HD)    Gloriann Loan, SPT  [place]  Office: [phone]

## 2020-05-03 NOTE — Progress Notes (Addendum)
°  Progress Note    05/03/2020 7:23 AM 1 Day Post-Op  Subjective:  Says his leg is sore  Afebrile  Vitals:   05/02/20 2018 05/03/20 0439  BP: 111/87 127/72  Pulse: 74 73  Resp: (!) 21 20  Temp: 98.1 F (36.7 C) 98 F (36.7 C)  SpO2: 94% 98%    Physical Exam: Incisions:  Bandage is clean and dry    CBC    Component Value Date/Time   WBC 13.1 (H) 05/03/2020 0411   RBC 2.92 (L) 05/03/2020 0411   HGB 7.9 (L) 05/03/2020 0411   HCT 25.6 (L) 05/03/2020 0411   PLT 342 05/03/2020 0411   MCV 87.7 05/03/2020 0411   MCH 27.1 05/03/2020 0411   MCHC 30.9 05/03/2020 0411   RDW 17.0 (H) 05/03/2020 0411   LYMPHSABS 1.0 04/29/2020 1737   MONOABS 0.6 04/29/2020 1737   EOSABS 0.0 04/29/2020 1737   BASOSABS 0.0 04/29/2020 1737    BMET    Component Value Date/Time   NA 134 (L) 05/03/2020 0411   K 6.1 (H) 05/03/2020 0411   CL 92 (L) 05/03/2020 0411   CO2 23 05/03/2020 0411   GLUCOSE 226 (H) 05/03/2020 0411   BUN 70 (H) 05/03/2020 0411   CREATININE 11.64 (H) 05/03/2020 0411   CREATININE 7.17 (H) 10/23/2012 1554   CALCIUM 8.3 (L) 05/03/2020 0411   GFRNONAA 4 (L) 05/03/2020 0411   GFRAA 5 (L) 05/03/2020 0411    INR    Component Value Date/Time   INR 1.4 (H) 05/02/2020 0345     Intake/Output Summary (Last 24 hours) at 05/03/2020 0723 Last data filed at 05/02/2020 1800 Gross per 24 hour  Intake 1044 ml  Output 150 ml  Net 894 ml     Assessment/Plan:  64 y.o. male is s/p left below knee amputation  1 Day Post-Op  -pt seen in dialysis.  His bandage is clean and dry and we will remove this tomorrow. -keep left leg elevated to help with swelling.  -leukocytosis improved.  Would continue IV abx for 48hrs then discontinue.  -acute blood loss anemia-stable from yesterday afternoon labs   Leontine Locket, PA-C [facility] [phone] 05/03/2020 7:23 AM  Addendum:  Received call about pain medication.  His oxycodone was increased to 5-10mg  q4h prn  pain and increased the frequency of his dilaudid to q3h from q4h.    Leontine Locket, [facility] 05/03/2020 12:05 PM

## 2020-05-03 NOTE — Progress Notes (Signed)
PT Cancellation Note  Patient Details Name: Jonathon Bailey MRN: [id number] DOB: 05-11-1956   Cancelled Treatment:    Reason Eval/Treat Not Completed: Other (comment) Checked on pt after HD, and pt wanting to hold on PT until tomorrow. Will follow up as schedule allows.   Jonathon Bailey, PT, DPT  Acute Rehabilitation Services  Pager: [phone] Office: [phone]    Jonathon Bailey 05/03/2020, 1:32 PM

## 2020-05-03 NOTE — Hospital Course (Signed)
  Jonathon Bailey is a 64 y.o. male who presented with dry gangrene of his left toes. Hospital course is below:

## 2020-05-04 DIAGNOSIS — E118 Type 2 diabetes mellitus with unspecified complications: Secondary | ICD-10-CM | POA: Diagnosis not present

## 2020-05-04 DIAGNOSIS — I2510 Atherosclerotic heart disease of native coronary artery without angina pectoris: Secondary | ICD-10-CM | POA: Diagnosis not present

## 2020-05-04 DIAGNOSIS — Z89512 Acquired absence of left leg below knee: Secondary | ICD-10-CM | POA: Diagnosis not present

## 2020-05-04 DIAGNOSIS — N186 End stage renal disease: Secondary | ICD-10-CM | POA: Diagnosis not present

## 2020-05-04 LAB — COMPREHENSIVE METABOLIC PANEL
ALT: 21 U/L (ref 0–44)
AST: 36 U/L (ref 15–41)
Albumin: 2.1 g/dL — ABNORMAL LOW (ref 3.5–5.0)
Alkaline Phosphatase: 74 U/L (ref 38–126)
Anion gap: 15 (ref 5–15)
BUN: 39 mg/dL — ABNORMAL HIGH (ref 8–23)
CO2: 24 mmol/L (ref 22–32)
Calcium: 8.1 mg/dL — ABNORMAL LOW (ref 8.9–10.3)
Chloride: 93 mmol/L — ABNORMAL LOW (ref 98–111)
Creatinine, Ser: 8.26 mg/dL — ABNORMAL HIGH (ref 0.61–1.24)
GFR calc Af Amer: 7 mL/min — ABNORMAL LOW (ref >60)
GFR calc non Af Amer: 6 mL/min — ABNORMAL LOW (ref >60)
Glucose, Bld: 162 mg/dL — ABNORMAL HIGH (ref 70–99)
Potassium: 4.6 mmol/L (ref 3.5–5.1)
Sodium: 132 mmol/L — ABNORMAL LOW (ref 135–145)
Total Bilirubin: 0.6 mg/dL (ref 0.3–1.2)
Total Protein: 7.2 g/dL (ref 6.5–8.1)

## 2020-05-04 LAB — CBC
HCT: 27.8 % — ABNORMAL LOW (ref 39.0–52.0)
Hemoglobin: 8.1 g/dL — ABNORMAL LOW (ref 13.0–17.0)
MCH: 25.9 pg — ABNORMAL LOW (ref 26.0–34.0)
MCHC: 29.1 g/dL — ABNORMAL LOW (ref 30.0–36.0)
MCV: 88.8 fL (ref 80.0–100.0)
Platelets: 386 10*3/uL (ref 150–400)
RBC: 3.13 MIL/uL — ABNORMAL LOW (ref 4.22–5.81)
RDW: 17.4 % — ABNORMAL HIGH (ref 11.5–15.5)
WBC: 12 10*3/uL — ABNORMAL HIGH (ref 4.0–10.5)
nRBC: 0 % (ref 0.0–0.2)

## 2020-05-04 LAB — GLUCOSE, CAPILLARY
Glucose-Capillary: 126 mg/dL — ABNORMAL HIGH (ref 70–99)
Glucose-Capillary: 138 mg/dL — ABNORMAL HIGH (ref 70–99)
Glucose-Capillary: 117 mg/dL — ABNORMAL HIGH (ref 70–99)
Glucose-Capillary: 129 mg/dL — ABNORMAL HIGH (ref 70–99)
Glucose-Capillary: 129 mg/dL — ABNORMAL HIGH (ref 70–99)

## 2020-05-04 LAB — PHOSPHORUS: Phosphorus: 6.1 mg/dL — ABNORMAL HIGH (ref 2.5–4.6)

## 2020-05-04 LAB — SURGICAL PATHOLOGY

## 2020-05-04 MED ORDER — CARVEDILOL 3.125 MG PO TABS
3.1250 mg | ORAL_TABLET | Freq: Two times a day (BID) | ORAL | Status: DC
  Administered 2020-05-04 – 2020-05-06 (×4): 3.125 mg via ORAL
  Filled 2020-05-04 (×5): qty 1

## 2020-05-04 MED ORDER — MENTHOL 3 MG MT LOZG
1.0000 | LOZENGE | Freq: Every day | OROMUCOSAL | Status: DC
  Administered 2020-05-04 – 2020-05-06 (×3): 3 mg via ORAL
  Filled 2020-05-04: qty 9

## 2020-05-04 MED ORDER — CETAPHIL MOISTURIZING EX LOTN
TOPICAL_LOTION | Freq: Every day | CUTANEOUS | Status: DC
  Filled 2020-05-04: qty 473

## 2020-05-04 MED ORDER — CHLORHEXIDINE GLUCONATE CLOTH 2 % EX PADS
6.0000 | MEDICATED_PAD | Freq: Every day | CUTANEOUS | Status: DC
  Administered 2020-05-04 – 2020-05-05 (×2): 6 via TOPICAL

## 2020-05-04 NOTE — TOC Initial Note (Addendum)
Transition of Care [facility]) - Initial/Assessment Note    Patient Details  Name: Jonathon Bailey MRN: [id number] Date of Birth: Feb 17, 1956  [facility]) CM/SW Contact:    Trula Ore, [place] Phone Number: 05/04/2020, 4:52 PM  Clinical Narrative:                  Update 5:47pm-Patient declined SNF. Patient wants to go home with home health services. CSW will reach out to case manager to follow up with patient.  CSW will continue to follow.  CSW spoke with patient at bedside. Patient wants CSW to come back to see him. Patient wants more time to make decision on whether he wants to go to SNF or go home with home health services.  CSW will continue to follow.   Barriers to Discharge: Continued Medical Work up   Patient Goals and CMS Choice   CMS Medicare.gov Compare Post Acute Care list provided to:: Patient Choice offered to / list presented to : Patient  Expected Discharge Plan and Services         Living arrangements for the past 2 months: Single Family Home                                      Prior Living Arrangements/Services Living arrangements for the past 2 months: Single Family Home Lives with:: Self, Adult Children Patient language and need for interpreter reviewed:: Yes        Need for Family Participation in Patient Care: Yes (Comment) Care giver support system in place?: Yes (comment)   Criminal Activity/Legal Involvement Pertinent to Current Situation/Hospitalization: No - Comment as needed  Activities of Daily Living Home Assistive Devices/Equipment: CBG Meter, Blood pressure cuff ADL Screening (condition at time of admission) Patient's cognitive ability adequate to safely complete daily activities?: Yes Is the patient deaf or have difficulty hearing?: No Does the patient have difficulty seeing, even when wearing glasses/contacts?: No Does the patient have difficulty concentrating, remembering, or making decisions?: No Patient  able to express need for assistance with ADLs?: Yes Does the patient have difficulty dressing or bathing?: No Independently performs ADLs?: Yes (appropriate for developmental age) Does the patient have difficulty walking or climbing stairs?: No Weakness of Legs: None Weakness of Arms/Hands: None  Permission Sought/Granted Permission sought to share information with : Case Manager, Family Supports, Chartered certified accountant granted to share information with : Yes, Verbal Permission Granted  Share Information with NAME: Girard Cooter     Permission granted to share info w Relationship: son  Permission granted to share info w Contact Information: Girard Cooter [phone]  Emotional Assessment Appearance:: Appears stated age Attitude/Demeanor/Rapport: Gracious Affect (typically observed): Calm Orientation: : Oriented to Self, Oriented to Place, Oriented to  Time, Oriented to Situation Alcohol / Substance Use: Not Applicable Psych Involvement: No (comment)  Admission diagnosis:  Cellulitis [L03.90] Ischemia of foot [I99.8] Patient Active Problem List   Diagnosis Date Noted  . Sepsis without septic shock ([place]) 04/28/2020  . 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 Diabetic retinopathy ([place]) 08/21/2016  . Other specified fever   . Generalized abdominal pain   . Acute lower GI bleeding   . ESRD (end stage renal disease) ([place])   . Bacterial peritonitis ([place])   . Blood poisoning   . ESRD on peritoneal dialysis ([place])   . Acute blood loss anemia   . Abdominal pain 09/01/2014  .  CAPD (continuous ambulatory peritoneal dialysis) status 03/15/2014  . Morbid obesity ([place]) 02/01/2014  . 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 Preventative health care 12/02/2013  . Other complications due to renal dialysis device, implant, and graft 10/24/2012  . COPD (chronic obstructive pulmonary disease) ([place]) 10/23/2012  . DIABETIC PERIPHERAL  NEUROPATHY 01/09/2010  . ESRD (end stage renal disease) on dialysis ([place]) 08/30/2009  . CEREBROVASCULAR ACCIDENT, HX OF 08/30/2009  . Diabetes mellitus type 2 with complications ([place]) 84/02/9860  . HYPERLIPIDEMIA 09/21/2008  . OBSTRUCTIVE SLEEP APNEA 09/21/2008  . ESSENTIAL HYPERTENSION 09/21/2008  . Coronary atherosclerosis 09/21/2008  . Chronic combined systolic and diastolic heart failure ([place]) 09/21/2008   PCP:  Clinic, [place]:   [place] ([place]), [place] [address] [place] [address] Phone: [phone] Fax: [phone]  [facility] [place], [place] [address] Phone: [phone] Fax: [phone]     Social Determinants of Health (SDOH) Interventions    Readmission Risk Interventions No flowsheet data found.

## 2020-05-04 NOTE — Progress Notes (Signed)
[facility] Progress Note   Subjective:  Seen in room. Working with PT. No overnight CP/dyspnea. Is having some phantom leg pain.  Objective Vitals:   05/03/20 1836 05/03/20 2015 05/04/20 0500 05/04/20 0800  BP:  119/76 107/68   Pulse: (!) 56 70 78 82  Resp: 15 14 16 19   Temp:  97.9 F (36.6 C) 99.3 F (37.4 C)   TempSrc:  Oral Oral   SpO2: 95% 94% 95% 96%  Weight:   121.4 kg   Height:       Physical Exam General:Well appearing man, NAD, overweight Heart:RRR; no murmur Lungs:CTAB Abdomen:soft, non-tender Extremities:No RLE edema; L BKA bandaged Dialysis Access:TDC in L chest (chronic)  Additional Objective Labs: Basic Metabolic Panel: Recent Labs  Lab 04/29/20 0428 04/30/20 0022 05/02/20 0345 05/03/20 0411 05/04/20 0521  NA 137   < > 135 134* 132*  K 4.7   < > 4.7 6.1* 4.6  CL 96*   < > 95* 92* 93*  CO2 27   < > 23 23 24   GLUCOSE 152*   < > 100* 226* 162*  BUN 31*   < > 54* 70* 39*  CREATININE 7.83*   < > 10.15* 11.64* 8.26*  CALCIUM 9.3   < > 8.3* 8.3* 8.1*  PHOS 5.9*  --   --  7.1*  --    < > = values in this interval not displayed.   Liver Function Tests: Recent Labs  Lab 05/02/20 0345 05/03/20 0411 05/04/20 0521  AST 21 27 36  ALT 27 25 21   ALKPHOS 70 66 74  BILITOT 0.6 0.7 0.6  PROT 7.9 7.5 7.2  ALBUMIN 2.2* 2.1* 2.1*   CBC: Recent Labs  Lab 04/29/20 1737 04/29/20 1737 04/30/20 0022 04/30/20 2014 05/01/20 0608 05/01/20 1603 05/02/20 0345 05/02/20 0345 05/02/20 1644 05/03/20 0411 05/04/20 0521  WBC 13.0*   < > 16.2*  --  14.8*  --  16.2*  --   --  13.1* 12.0*  NEUTROABS 11.3*  --   --   --   --   --   --   --   --   --   --   HGB 8.4*   < > 7.5*   < > 7.7*   < > 8.5*   < > 7.9* 7.9* 8.1*  HCT 27.9*   < > 25.0*   < > 26.0*   < > 27.4*   < > 26.1* 25.6* 27.8*  MCV 86.1   < > 86.5  --  87.5  --  87.5  --   --  87.7 88.8  PLT 351   < > 350  --  348  --  365  --   --  342 386   < > = values in this interval not  displayed.   CBG: Recent Labs  Lab 05/03/20 0626 05/03/20 1153 05/03/20 1625 05/03/20 2234 05/04/20 0806  GLUCAP 205* 131* 154* 153* 126*   Iron Studies:  Recent Labs    05/01/20 1132  IRON 9*  TIBC 157*  FERRITIN 218   Studies/Results: ECHOCARDIOGRAM COMPLETE  Result Date: 05/02/2020    ECHOCARDIOGRAM REPORT   Patient Name:   Jonathon Bailey Date of Exam: 05/02/2020 Medical Rec #:  [id number]          Height:       71.0 in Accession #:    [id number]         Weight:  277.5 lb Date of Birth:  06/18/1956          BSA:          2.423 m Patient Age:    64 years           BP:           108/55 mmHg Patient Gender: M                  HR:           74 bpm. Exam Location:  Inpatient Procedure: 2D Echo, Color Doppler and Cardiac Doppler Indications:    I50.9* Heart failure (unspecified), Hypotension  History:        Patient has prior history of Echocardiogram examinations, most                 recent 10/14/2012. COPD; Risk Factors:Hypertension, Diabetes,                 Dyslipidemia and Sleep Apnea.  Sonographer:    Raquel Sarna Senior RDCS Referring Phys: [phone] CARINA M BROWN IMPRESSIONS  1. Left ventricular ejection fraction, by estimation, is 50 to 55%. The left ventricle has low normal function. The left ventricle has no regional wall motion abnormalities. Left ventricular diastolic function could not be evaluated.  2. Right ventricular systolic function is mildly reduced. The right ventricular size is mildly enlarged. There is normal pulmonary artery systolic pressure. The estimated right ventricular systolic pressure is 14.4 mmHg.  3. Mild to moderate calcific mitral stenosis is present. The mitral valve is degenerative. No evidence of mitral valve regurgitation. Mild to moderate mitral stenosis. The mean mitral valve gradient is 4.5 mmHg with average heart rate of 72 bpm.  4. Mild to moderate aortic stenosis is present. Vmax 2.3 m/s, MG 12.5 mmHG, AVA 1.06 cm2, DI 0.34. Gradients lower than  expected due to low SVi (21 cc/m2). The aortic valve is tricuspid. Aortic valve regurgitation is not visualized. Mild to moderate aortic valve stenosis.  5. The inferior vena cava is dilated in size with <50% respiratory variability, suggesting right atrial pressure of 15 mmHg. FINDINGS  Left Ventricle: Left ventricular ejection fraction, by estimation, is 50 to 55%. The left ventricle has low normal function. The left ventricle has no regional wall motion abnormalities. The left ventricular internal cavity size was normal in size. There is no left ventricular hypertrophy. Abnormal (paradoxical) septal motion consistent with post-operative status. Left ventricular diastolic function could not be evaluated due to mitral annular calcification (moderate or greater). Left ventricular diastolic function could not be evaluated. Right Ventricle: The right ventricular size is mildly enlarged. No increase in right ventricular wall thickness. Right ventricular systolic function is mildly reduced. There is normal pulmonary artery systolic pressure. The tricuspid regurgitant velocity  is 2.27 m/s, and with an assumed right atrial pressure of 15 mmHg, the estimated right ventricular systolic pressure is 31.5 mmHg. Left Atrium: Left atrial size was normal in size. Right Atrium: Right atrial size was normal in size. Pericardium: Trivial pericardial effusion is present. Mitral Valve: Mild to moderate calcific mitral stenosis is present. The mitral valve is degenerative in appearance. Moderate mitral annular calcification. No evidence of mitral valve regurgitation. Mild to moderate mitral valve stenosis. The mean mitral valve gradient is 4.5 mmHg with average heart rate of 72 bpm. Tricuspid Valve: The tricuspid valve is grossly normal. Tricuspid valve regurgitation is not demonstrated. No evidence of tricuspid stenosis. Aortic Valve: Mild to moderate aortic stenosis is present. Vmax  2.3 m/s, MG 12.5 mmHG, AVA 1.06 cm2, DI 0.34.  Gradients lower than expected due to low SVi (21 cc/m2). The aortic valve is tricuspid. . There is moderate thickening and moderate calcification of the aortic valve. Aortic valve regurgitation is not visualized. Mild to moderate aortic stenosis is present. There is moderate thickening of the aortic valve. There is moderate calcification of the aortic valve. Aortic valve mean gradient measures 12.5 mmHg. Aortic valve peak gradient measures 21.5 mmHg. Aortic valve area, by VTI measures 1.06 cm. Pulmonic Valve: The pulmonic valve was grossly normal. Pulmonic valve regurgitation is not visualized. No evidence of pulmonic stenosis. Aorta: The aortic root is normal in size and structure. Venous: The inferior vena cava is dilated in size with less than 50% respiratory variability, suggesting right atrial pressure of 15 mmHg. IAS/Shunts: The atrial septum is grossly normal. Additional Comments: A venous catheter is visualized in the right atrium.  LEFT VENTRICLE PLAX 2D LVIDd:         4.90 cm  Diastology LVIDs:         3.60 cm  LV e' lateral:   8.38 cm/s LV PW:         1.00 cm  LV E/e' lateral: 14.7 LV IVS:        0.80 cm  LV e' medial:    5.44 cm/s LVOT diam:     2.00 cm  LV E/e' medial:  22.6 LV SV:         50 LV SV Index:   21 LVOT Area:     3.14 cm  RIGHT VENTRICLE RV S prime:     5.77 cm/s TAPSE (M-mode): 2.0 cm LEFT ATRIUM             Index       RIGHT ATRIUM           Index LA diam:        4.70 cm 1.94 cm/m  RA Area:     17.30 cm LA Vol (A2C):   71.6 ml 29.54 ml/m RA Volume:   40.10 ml  16.55 ml/m LA Vol (A4C):   85.7 ml 35.36 ml/m LA Biplane Vol: 78.8 ml 32.52 ml/m  AORTIC VALVE AV Area (Vmax):    0.98 cm AV Area (Vmean):   1.07 cm AV Area (VTI):     1.06 cm AV Vmax:           232.03 cm/s AV Vmean:          166.756 cm/s AV VTI:            0.476 m AV Peak Grad:      21.5 mmHg AV Mean Grad:      12.5 mmHg LVOT Vmax:         72.50 cm/s LVOT Vmean:        56.600 cm/s LVOT VTI:          0.160 m LVOT/AV VTI  ratio: 0.34  AORTA Ao Root diam: 3.20 cm MITRAL VALVE                TRICUSPID VALVE MV Area (PHT): 2.50 cm     TR Peak grad:   20.6 mmHg MV Area VTI:   1.27 cm     TR Vmax:        227.00 cm/s MV Mean grad:  4.5 mmHg MV VTI:        0.40 m       SHUNTS MV Decel Time: 303 msec  Systemic VTI:  0.16 m MV E velocity: 123.00 cm/s  Systemic Diam: 2.00 cm MV A velocity: 110.00 cm/s MV E/A ratio:  1.12 Eleonore Chiquito MD Electronically signed by Eleonore Chiquito MD Signature Date/Time: 05/02/2020/4:26:00 PM    Final    Medications: . sodium chloride    . sodium chloride 10 mL/hr at 05/02/20 0945  . piperacillin-tazobactam (ZOSYN)  IV 2.25 g (05/04/20 0540)   . sodium chloride   Intravenous Once  . sodium chloride   Intravenous Once  . vitamin C  1,000 mg Oral BID  . aspirin  81 mg Oral QODAY  . atorvastatin  40 mg Oral QHS  . Chlorhexidine Gluconate Cloth  6 each Topical Q0600  . cinacalcet  90 mg Oral Q supper  . [START ON 05/05/2020] darbepoetin (ARANESP) injection - DIALYSIS  100 mcg Intravenous Q Thu-HD  . doxercalciferol  6 mcg Intravenous Q T,Th,Sa-HD  . ferrous gluconate  324 mg Oral BID WC  . heparin injection (subcutaneous)  5,000 Units Subcutaneous Q8H  . insulin aspart  0-15 Units Subcutaneous TID WC  . insulin glargine  20 Units Subcutaneous QHS  . melatonin  5 mg Oral QHS  . multivitamin  1 tablet Oral QHS  . pantoprazole  40 mg Oral Daily  . polyethylene glycol  17 g Oral Daily  . senna  1 tablet Oral Daily  . sevelamer carbonate  4.8 g Oral TID WC  . sodium chloride flush  3 mL Intravenous Q12H  . umeclidinium bromide  1 puff Inhalation Daily    Dialysis Orders: TTS @ [facility] 400/800 126kg 2/2.25 bath Hep 12000bolusTDC -Mircera67mcg q 2wks - just reordered/not yet given,lastdose1/26/21 -Hectorol10mcg IV qHD  Assessment/Plan: 1. PAD with L leg ischemia + gangrene:X-ray w/o signs of osteomyelitis, on empiricVanc + Zosyn.VVS consulted-> undewent BLE  arteriogram 8/6showingsevere bilateralPAD below the knees. 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 Donnetta Hutching. Being evaluated for CIR. 2. ESRD: Continue HD per TTS schedule - next 8/12. 3. Hypotension/volume: Chronic low BP - Coreg on hold. EDW will be lower on discharge. 4. AnemiaofESRD: Hgb 8.1.Aranesp 42mcg given with HD 8/5, will increase next dose to 143mcg.No IV iron due to allergy - started on PO iron BID instead (tsat 6%, ferritin 218) 5. Secondary Hyperparathyroidism:Ca at goal.Phos elevated.Continue VDRA, sensipar,and binders. 6. Constipation: Givenmag citrate + POdulcolax 8/8. 7. Nutrition- Renal diet w/fluid restrictions.  8. DMT2 - Per primary 9. CAD s/p CABG - per primary 10. Hx CVA 11. COPD 41. OSA - on CPAP  Veneta Penton, PA-C 05/04/2020, 9:17 AM  [employer]

## 2020-05-04 NOTE — Evaluation (Signed)
Physical Therapy Evaluation Patient Details Name: Jonathon Bailey MRN: [id number] DOB: August 25, 1956 Today's Date: 05/04/2020   History of Present Illness  64 year old with history of CAD s/p CABG, ESRD on HD, type 2 diabetes on insulin, and cerebrovascular disease presenting with sepsis due to wet gangrene of left foot. 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  Clinical Impression  PTA pt living with son in single story handicap equipped apartment. Pt reports independence with ambulation, limited more recently by L foot pain. Pt states he is independent with ADLs and iADLs. Pt is currently limited in safe mobility by pain and change in CoG change, in presence of decreased strength and endurance. Pt requires min guard for bed mobility and modA for transfers. Pt educated on techniques for decreasing phantom limb pain, maintaining knee extension for eventual prosthetic placement and chair push ups to work on UE strengthening. PT recommending and pt in agreement with SNF placement. PT will continue to follow acutely.     Follow Up Recommendations SNF    Equipment Recommendations  Other (comment) (TBD at next venue)    Recommendations for Other Services       Precautions / Restrictions Precautions Precautions: Fall Precaution Comments: recent L BKA Restrictions Weight Bearing Restrictions: Yes LLE Weight Bearing: Non weight bearing      Mobility  Bed Mobility Overal bed mobility: Needs Assistance Bed Mobility: Supine to Sit     Supine to sit: HOB elevated;Min guard     General bed mobility comments: increased time and effort for utilizing bedrail to pull to EoB   Transfers Overall transfer level: Needs assistance Equipment used: Rolling walker (2 wheeled);1 person hand held assist Transfers: Sit to/from [facility] Sit to Stand: Mod assist;From elevated surface Stand pivot transfers: Mod assist       General transfer comment: modA for power up and steadying  with RW, attempted multiple hand placements and elevating bed. Once up needed assist to steady due to change in CoG. Attempted hopping in place with use of UE extension, able to achieve minimal movement, Sat back down and able to complete stand pivot transfer to recliner on his R, fear of falling also inhibiting movement  Ambulation/Gait  unable                     Balance Overall balance assessment: Needs assistance Sitting-balance support: Feet supported;No upper extremity supported Sitting balance-Leahy Scale: Fair     Standing balance support: Bilateral upper extremity supported Standing balance-Leahy Scale: Poor                               Pertinent Vitals/Pain Pain Assessment: 0-10 Pain Score: 3  Pain Location: L LE phantom pains and residual leg pain Pain Descriptors / Indicators: Sharp;Throbbing;Sore Pain Intervention(s): Premedicated before session;Monitored during session;Limited activity within patient's tolerance;Repositioned;Utilized relaxation techniques;Other (comment) (educated on phantom limb pain, visualization, rubbing limb)    Home Living Family/patient expects to be discharged to:: Private residence Living Arrangements: Children Available Help at Discharge: Family;Available PRN/intermittently Type of Home: Apartment Home Access: Level entry     Home Layout: One level Home Equipment: Grab bars - toilet;Grab bars - tub/shower;Hand held shower head      Prior Function Level of Independence: Independent         Comments: as L foot pain increased more reliant on SPC for short distance ambulation, independent in ADLs/iADLs  Hand Dominance   Dominant Hand: Right    Extremity/Trunk Assessment   Upper Extremity Assessment Upper Extremity Assessment: Generalized weakness    Lower Extremity Assessment Lower Extremity Assessment: LLE deficits/detail LLE Deficits / Details: new BKA LLE: Unable to fully assess due to pain        Communication   Communication: No difficulties  Cognition Arousal/Alertness: Awake/alert Behavior During Therapy: WFL for tasks assessed/performed Overall Cognitive Status: Within Functional Limits for tasks assessed                                        General Comments General comments (skin integrity, edema, etc.): VSS on RA, residual limb wrapped with Ace bandage and is clean, dry and intact. Educated pt on need for maintaining knee extension to insure best prosthetic fit in future.    Exercises General Exercises - Upper Extremity Chair Push Up: AROM;10 reps;Seated   Assessment/Plan    PT Assessment Patient needs continued PT services  PT Problem List Decreased strength;Decreased activity tolerance;Decreased balance;Decreased mobility;Decreased coordination;Obesity;Pain       PT Treatment Interventions DME instruction;Gait training;Functional mobility training;Therapeutic activities;Therapeutic exercise;Balance training;Patient/family education    PT Goals (Current goals can be found in the Care Plan section)  Acute Rehab PT Goals Patient Stated Goal: go to son's band performances at [employer]  PT Goal Formulation: With patient Time For Goal Achievement: 05/18/20 Potential to Achieve Goals: Good    Frequency Min 2X/week    AM-PAC PT "6 Clicks" Mobility  Outcome Measure Help needed turning from your back to your side while in a flat bed without using bedrails?: None Help needed moving from lying on your back to sitting on the side of a flat bed without using bedrails?: A Little Help needed moving to and from a bed to a chair (including a wheelchair)?: A Lot Help needed standing up from a chair using your arms (e.g., wheelchair or bedside chair)?: A Lot Help needed to walk in hospital room?: Total Help needed climbing 3-5 steps with a railing? : Total 6 Click Score: 13    End of Session Equipment Utilized During Treatment: Gait belt Activity  Tolerance: Patient tolerated treatment well Patient left: in chair;with call bell/phone within reach;with chair alarm set;Other (comment) (MD in room ) Nurse Communication: Mobility status PT Visit Diagnosis: Unsteadiness on feet (R26.81);Other abnormalities of gait and mobility (R26.89);Muscle weakness (generalized) (M62.81);Difficulty in walking, not elsewhere classified (R26.2);Pain Pain - Right/Left: Left Pain - part of body: Leg    Time: 3570-1779 PT Time Calculation (min) (ACUTE ONLY): 41 min   Charges:   PT Evaluation $PT Eval Moderate Complexity: 1 Mod PT Treatments $Gait Training: 8-22 mins $Therapeutic Exercise: 8-22 mins        Tally Mckinnon B. Migdalia Dk PT, DPT Acute Rehabilitation Services Pager [phone] Office [phone]   [place] 05/04/2020, 10:33 AM

## 2020-05-04 NOTE — Evaluation (Signed)
Occupational Therapy Evaluation Patient Details Name: Jonathon Bailey MRN: [id number] DOB: 10-20-1955 Today's Date: 05/04/2020    History of Present Illness 64 year old with history of CAD s/p CABG, ESRD on HD, type 2 diabetes on insulin, and cerebrovascular disease presenting with sepsis due to wet gangrene of left foot. 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   Clinical Impression   Patient is s/p L BKA surgery resulting in functional limitations due to the deficits listed below (see OT problem list). Pt currently requires max (A) with stedy to complete sit<>Stand. Pt motivated by the use of the stedy. Pt able to complete transfer with LLE clear of surfaces.  Patient will benefit from skilled OT acutely to increase independence and safety with ADLS to allow discharge SNF.     Follow Up Recommendations  SNF    Equipment Recommendations  Wheelchair (measurements OT);Wheelchair cushion (measurements OT);3 in 1 bedside commode;Other (comment) (possible hoyer lift)    Recommendations for Other Services       Precautions / Restrictions Precautions Precautions: Fall Precaution Comments: recent L BKA Restrictions Weight Bearing Restrictions: Yes LLE Weight Bearing: Non weight bearing      Mobility Bed Mobility               General bed mobility comments: oob in chair on arrival  Transfers Overall transfer level: Needs assistance   Transfers: Sit to/from Stand Sit to Stand: Max assist         General transfer comment: pt with L UE on chair and R UE on stedy. pt unable to release the chair on first attempt and returning to sitting on chair surface. pt anxiousa with transfer. pt able to attempt and transfer hand on additional attempts. pt needs step by step instructions.     Balance Overall balance assessment: Needs assistance Sitting-balance support: Bilateral upper extremity supported;Feet supported Sitting balance-Leahy Scale: Fair     Standing balance  support: Bilateral upper extremity supported;During functional activity Standing balance-Leahy Scale: Poor                             ADL either performed or assessed with clinical judgement   ADL Overall ADL's : Needs assistance/impaired Eating/Feeding: Independent   Grooming: Wash/dry face;Wash/dry hands;Set up;Sitting               Lower Body Dressing: Maximal assistance   Toilet Transfer: Maximal assistance (stedy)             General ADL Comments: pt completed transfer from chair x3 attempts with x2 successful attempts. pt reports liking the stedy for transfer due to UB weakness     Vision Baseline Vision/History: No visual deficits       Perception     Praxis      Pertinent Vitals/Pain Pain Assessment: Faces Faces Pain Scale: Hurts even more Pain Location: LLE pain after movement Pain Descriptors / Indicators: Sharp;Throbbing;Sore Pain Intervention(s): Monitored during session;Repositioned;Patient requesting pain meds-RN notified     Hand Dominance Right   Extremity/Trunk Assessment Upper Extremity Assessment Upper Extremity Assessment: Generalized weakness       Cervical / Trunk Assessment Cervical / Trunk Assessment: Normal   Communication Communication Communication: No difficulties   Cognition Arousal/Alertness: Awake/alert Behavior During Therapy: WFL for tasks assessed/performed Overall Cognitive Status: Within Functional Limits for tasks assessed  General Comments  VSS, ace wrap intact and dry    Exercises Exercises: Other exercises Other Exercises Other Exercises: educated on knee extension and activation of quad x10 reps   Shoulder Instructions      Home Living Family/patient expects to be discharged to:: Skilled nursing facility                                        Prior Functioning/Environment Level of Independence: Independent                  OT Problem List: Decreased strength;Decreased activity tolerance;Impaired balance (sitting and/or standing);Decreased safety awareness;Decreased knowledge of use of DME or AE;Decreased knowledge of precautions;Pain;Obesity;Decreased range of motion      OT Treatment/Interventions: Self-care/ADL training;Therapeutic exercise;DME and/or AE instruction;Energy conservation;Manual therapy;Modalities;Therapeutic activities;Patient/family education;Balance training    OT Goals(Current goals can be found in the care plan section) Acute Rehab OT Goals Patient Stated Goal: to get stronger UB OT Goal Formulation: With patient Time For Goal Achievement: 05/18/20 Potential to Achieve Goals: Good  OT Frequency: Min 2X/week   Barriers to D/C:            Co-evaluation              AM-PAC OT "6 Clicks" Daily Activity     Outcome Measure Help from another person eating meals?: None Help from another person taking care of personal grooming?: A Little Help from another person toileting, which includes using toliet, bedpan, or urinal?: A Lot Help from another person bathing (including washing, rinsing, drying)?: A Lot Help from another person to put on and taking off regular upper body clothing?: A Little Help from another person to put on and taking off regular lower body clothing?: A Lot 6 Click Score: 16   End of Session Equipment Utilized During Treatment: Gait belt Nurse Communication: Mobility status;Precautions;Weight bearing status  Activity Tolerance: Patient tolerated treatment well Patient left: in chair;with call bell/phone within reach;with chair alarm set  OT Visit Diagnosis: Unsteadiness on feet (R26.81);Muscle weakness (generalized) (M62.81);Pain Pain - Right/Left: Left Pain - part of body: Leg                Time: 1406-1430 OT Time Calculation (min): 24 min Charges:  OT General Charges $OT Visit: 1 Visit OT Evaluation $OT Eval Moderate Complexity: 1 Mod OT  Treatments $Self Care/Home Management : 8-22 mins   [place]nn, OTR/L  Acute Rehabilitation Services Pager: [phone] Office: [phone] .   Jeri Modena 05/04/2020, 2:50 PM

## 2020-05-04 NOTE — Progress Notes (Signed)
Family Medicine Teaching Service Daily Progress Note Intern Pager: [phone]  Patient name: Jonathon Bailey Medical record number: [id number] Date of birth: 08-Jan-1956 Age: 64 y.o. Gender: male  Primary Care Provider: [facility] Consultants: Vascular, Ortho Code Status: Full   Pt Overview and Major Events to Date:  Admitted 8/5  HD 8/5, 8/10 Abdominal aortogram and leg arteriogram: 8/6 Left below knee amputation: 8/9  Assessment and Plan: Jonathon Bailey is a 64 y.o. male presenting with gangrene of left toes. PMH is significant for CAD s/p 5 stents, CVA x2, MI x3, T2DM insulin dependent, ESRD , macular edema, HLD, HTN, HFrEF  Severe PAD with ischemic left foot  Left toe dry gangrene: Stable. Patienthad left below knee amputationthis morning by vascular surgery -Antibiotics discontinued after 48 hours. Vanc d/c, zosyn d/c today.  - keep L leg elevated to help with swelling - bandage was replaced today - Tylenolq6scheduledfor mild pain, oxycodone 5 mg every 6 as needed for moderate/severe. Dilaudid 1mg  q3 hrs prn for moderate/severe pain.  - Monitor CBC  Acute on chronic anemia of JYN:WGNFAOZHY. Hgb8.1this am. Will continue to monitor  ESRD: Chronic, stable. TThSatschedule. -Nephrology on board, appreciate assistance -Monitor electrolytes  Insulin-dependentT2DM: Chronic, stable. A1c 7.9. Takes Levemir 75U BID w/ novolog 5 BID PRN. -Lantus 20 units last night. CBG today 126  -Hold home Levemir while receiving Lantus as above -Sensitive SSI -Monitor CBGs with meals and at bedtime  Constipation: Chronic, stable. Receiving opioids. - dulcolax 10pmg prn - colace 100mg  qd  Hypertension: Chronic, stable. [id number]. - restart Coreg 3.125 mg -Continue to hold home hydralazined/tlower pressure -Monitor BP  HFrEF: Chronic, stable. EF 45% in 2013. Likely in setting of above. [facility] -Neph[facility] for HD/volume  control -Continue CAD medications as above -Monitor fluid status  Esophageal dysphagia: Chronic, stable. [facility],structural abnormality present however does well with regular diet. -Monitor, maintain renal diet as is  CAD, s/p MI and CABG: Chronic, stable.  [facility] No current anginal symptoms. -Continue home ASA 81 mg every other day -Continue home statin - coreg restarted   Previous CVA: Chronic, stable. No significant residual deficits. -ASA and statin as above  Hyperlipidemia: Chronic, stable. -Continue home atorvastatin 40 mg  Pt requested cough drops and lotion for his back   FEN/GI:Renal carb modified  Prophylaxis: Heparin [place]  Disposition:PendingPT/post op care   Subjective:  No acute events overnight. Pt drowsy this morning when I examined him. Stated he was still in some pain and that possibly the pain medication had not kicked in. He denied any other concerns or complaints.   Objective: Temp:  [97.6 F (36.4 C)-99.3 F (37.4 C)] 99.3 F (37.4 C) (08/11 0500) Pulse Rate:  [56-82] 78 (08/11 0500) Resp:  [13-20] 16 (08/11 0500) BP: (103-134)/(52-92) 107/68 (08/11 0500) SpO2:  [94 %-99 %] 95 % (08/11 0500) Weight:  [121.4 kg-122.1 kg] 121.4 kg (08/11 0500) Physical Exam: General: well appearing, obese, in no acute distress  Cardiovascular: RRR. No murmurs, rubs, or gallops Respiratory: CTA bilaterally. No wheezes, rales, rhonchi  Abdomen: soft, non distended, non tender Extremities: L BKA bandaged. RLE warm and dry with no edema. Distal pulse 2+   Laboratory: Recent Labs  Lab 05/02/20 0345 05/02/20 0345 05/02/20 1644 05/03/20 0411 05/04/20 0521  WBC 16.2*  --   --  13.1* 12.0*  HGB 8.5*   < > 7.9* 7.9* 8.1*  HCT 27.4*   < > 26.1* 25.6* 27.8*  PLT 365  --   --  342 386   < > = values in this interval not displayed.   Recent Labs  Lab 05/02/20 0345 05/03/20 0411 05/04/20 0521  NA 135 134* 132*  K 4.7 6.1*  4.6  CL 95* 92* 93*  CO2 23 23 24   BUN 54* 70* 39*  CREATININE 10.15* 11.64* 8.26*  CALCIUM 8.3* 8.3* 8.1*  PROT 7.9 7.5 7.2  BILITOT 0.6 0.7 0.6  ALKPHOS 70 66 74  ALT 27 25 21   AST 21 27 36  GLUCOSE 100* 226* 162*    Imaging/Diagnostic Tests:  No new images   Theone Stanley, DO 05/04/2020, 8:08 AM PGY-1 , [place] Intern pager: [phone], text pages welcome

## 2020-05-04 NOTE — Progress Notes (Addendum)
Progress Note    05/04/2020 8:35 AM 2 Days Post-Op  Subjective:  Having some phantom pain. Requiring Po and Iv pain med   Vitals:   05/03/20 2015 05/04/20 0500  BP: 119/76 107/68  Pulse: 70 78  Resp: 14 16  Temp: 97.9 F (36.6 C) 99.3 F (37.4 C)  SpO2: 94% 95%    Physical Exam: Cardiac:  RRR Lungs:  nonlabored Incisions:  Well approximated. No bleeding or drainage Extremities:  LLE: dressing removed and replaced.  Moderate edema. Flaps warm and well perfused.   CBC    Component Value Date/Time   WBC 12.0 (H) 05/04/2020 0521   RBC 3.13 (L) 05/04/2020 0521   HGB 8.1 (L) 05/04/2020 0521   HCT 27.8 (L) 05/04/2020 0521   PLT 386 05/04/2020 0521   MCV 88.8 05/04/2020 0521   MCH 25.9 (L) 05/04/2020 0521   MCHC 29.1 (L) 05/04/2020 0521   RDW 17.4 (H) 05/04/2020 0521   LYMPHSABS 1.0 04/29/2020 1737   MONOABS 0.6 04/29/2020 1737   EOSABS 0.0 04/29/2020 1737   BASOSABS 0.0 04/29/2020 1737    BMET    Component Value Date/Time   NA 132 (L) 05/04/2020 0521   K 4.6 05/04/2020 0521   CL 93 (L) 05/04/2020 0521   CO2 24 05/04/2020 0521   GLUCOSE 162 (H) 05/04/2020 0521   BUN 39 (H) 05/04/2020 0521   CREATININE 8.26 (H) 05/04/2020 0521   CREATININE 7.17 (H) 10/23/2012 1554   CALCIUM 8.1 (L) 05/04/2020 0521   GFRNONAA 6 (L) 05/04/2020 0521   GFRAA 7 (L) 05/04/2020 0521     Intake/Output Summary (Last 24 hours) at 05/04/2020 0835 Last data filed at 05/03/2020 1400 Gross per 24 hour  Intake 222 ml  Output 2300 ml  Net -2078 ml    HOSPITAL MEDICATIONS Scheduled Meds: . sodium chloride   Intravenous Once  . sodium chloride   Intravenous Once  . vitamin C  1,000 mg Oral BID  . aspirin  81 mg Oral QODAY  . atorvastatin  40 mg Oral QHS  . Chlorhexidine Gluconate Cloth  6 each Topical Q0600  . cinacalcet  90 mg Oral Q supper  . [START ON 05/05/2020] darbepoetin (ARANESP) injection - DIALYSIS  100 mcg Intravenous Q Thu-HD  . doxercalciferol  6 mcg Intravenous Q  T,Th,Sa-HD  . ferrous gluconate  324 mg Oral BID WC  . heparin injection (subcutaneous)  5,000 Units Subcutaneous Q8H  . insulin aspart  0-15 Units Subcutaneous TID WC  . insulin glargine  20 Units Subcutaneous QHS  . melatonin  5 mg Oral QHS  . multivitamin  1 tablet Oral QHS  . pantoprazole  40 mg Oral Daily  . polyethylene glycol  17 g Oral Daily  . senna  1 tablet Oral Daily  . sevelamer carbonate  4.8 g Oral TID WC  . sodium chloride flush  3 mL Intravenous Q12H  . umeclidinium bromide  1 puff Inhalation Daily   Continuous Infusions: . sodium chloride    . sodium chloride 10 mL/hr at 05/02/20 0945  . piperacillin-tazobactam (ZOSYN)  IV 2.25 g (05/04/20 0540)   PRN Meds:.sodium chloride, acetaminophen **OR** acetaminophen, bisacodyl, HYDROmorphone (DILAUDID) injection, oxyCODONE, sodium chloride flush, tobramycin  Assessment: POD 2 Left BKA. Some phantom pain. Anemia with stable Hgb  Plan: -PT/OT.  -consult [employer]  -DVT prophylaxis:  Heparin [employer]   Risa Grill, PA-C [facility] [phone] 05/04/2020  8:35 AM   I have examined the patient, reviewed and  agree with above.  More comfortable today.  Sitting up in chair.  Dressing intact.  Physical findings noted above.  Expressed gratitude for care he is received.  Will continue with PT and CIR evaluation  Curt Jews, MD 05/04/2020 1:51 PM

## 2020-05-04 NOTE — Consult Note (Signed)
PV Navigator consult acknowledged and chart reviewed.   64 y/o gentleman admitted from [facility] clinic with ischemic L foot/wet gangrene and sepsis. He underwent a L below the knee amputation 05/02/20 with vascular service and now POD #2.  PMH to include CAD s/p CABG, ESRD on HD 3xweek and DM-2 on insulin. Former smoker 1 ppd x 15 yrs.  Awaiting PT/OT eval/recommendations to determine IP rehab referral. [facility] team following. I have asked attending MD via SecureChat for potential Diabetes Coordinator consult to assist with glycemic control.   No other barriers/needs identified at this time. Will follow as patient transitions.  Thank you! Cletis Media RN BSN C[facility] [place]

## 2020-05-05 DIAGNOSIS — Z89512 Acquired absence of left leg below knee: Secondary | ICD-10-CM

## 2020-05-05 DIAGNOSIS — E118 Type 2 diabetes mellitus with unspecified complications: Secondary | ICD-10-CM | POA: Diagnosis not present

## 2020-05-05 DIAGNOSIS — A419 Sepsis, unspecified organism: Secondary | ICD-10-CM | POA: Diagnosis not present

## 2020-05-05 DIAGNOSIS — N186 End stage renal disease: Secondary | ICD-10-CM | POA: Diagnosis not present

## 2020-05-05 LAB — GLUCOSE, CAPILLARY
Glucose-Capillary: 110 mg/dL — ABNORMAL HIGH (ref 70–99)
Glucose-Capillary: 81 mg/dL (ref 70–99)
Glucose-Capillary: 112 mg/dL — ABNORMAL HIGH (ref 70–99)

## 2020-05-05 LAB — COMPREHENSIVE METABOLIC PANEL
ALT: 17 U/L (ref 0–44)
AST: 41 U/L (ref 15–41)
Albumin: 2.1 g/dL — ABNORMAL LOW (ref 3.5–5.0)
Alkaline Phosphatase: 75 U/L (ref 38–126)
Anion gap: 15 (ref 5–15)
BUN: 49 mg/dL — ABNORMAL HIGH (ref 8–23)
CO2: 23 mmol/L (ref 22–32)
Calcium: 8.2 mg/dL — ABNORMAL LOW (ref 8.9–10.3)
Chloride: 95 mmol/L — ABNORMAL LOW (ref 98–111)
Creatinine, Ser: 10.3 mg/dL — ABNORMAL HIGH (ref 0.61–1.24)
GFR calc Af Amer: 6 mL/min — ABNORMAL LOW (ref >60)
GFR calc non Af Amer: 5 mL/min — ABNORMAL LOW (ref >60)
Glucose, Bld: 112 mg/dL — ABNORMAL HIGH (ref 70–99)
Potassium: 4.6 mmol/L (ref 3.5–5.1)
Sodium: 133 mmol/L — ABNORMAL LOW (ref 135–145)
Total Bilirubin: 0.4 mg/dL (ref 0.3–1.2)
Total Protein: 7.4 g/dL (ref 6.5–8.1)

## 2020-05-05 MED ORDER — DOXERCALCIFEROL 4 MCG/2ML IV SOLN
INTRAVENOUS | Status: AC
  Filled 2020-05-05: qty 4

## 2020-05-05 MED ORDER — HEPARIN SODIUM (PORCINE) 1000 UNIT/ML IJ SOLN
INTRAMUSCULAR | Status: AC
  Filled 2020-05-05: qty 4

## 2020-05-05 MED ORDER — DARBEPOETIN ALFA 100 MCG/0.5ML IJ SOSY
PREFILLED_SYRINGE | INTRAMUSCULAR | Status: AC
  Filled 2020-05-05: qty 0.5

## 2020-05-05 NOTE — Care Management Important Message (Signed)
Important Message  Patient Details  Name: Jonathon Bailey MRN: [id number] Date of Birth: 02-20-1956   Medicare Important Message Given:  Yes     Shelda Altes 05/05/2020, 12:45 PM

## 2020-05-05 NOTE — Progress Notes (Signed)
Patient ID: Jonathon Bailey, male   DOB: 1956-07-11, 64 y.o.   MRN: [id number] Uncomfortable this morning from incisional pain.  Has not had any pain medication yet this morning.  Asked his nurse to provide this.  Dressing intact.  Will not take down today since he was seen yesterday.  Will change dressing again tomorrow morning.  Continue to work with physical therapy.  Seems to be a excellent candidate for CIR from my standpoint.  Await consult.

## 2020-05-05 NOTE — Progress Notes (Signed)
Inpatient Rehab Admissions:  Inpatient Rehab Consult received.  I met with patient in dialysis for rehabilitation assessment and to discuss goals and expectations of an inpatient rehab admission.  Note pt declining SNF, but open to CIR level therapy.  Given assist level, and medical needs, feel he would be a candidate for CIR.  Discussed briefly expectation for pain to managed on PO meds prior to being able to transfer to CIR, and let him know I will f/u with him tomorrow.    Signed: Shann Medal, PT, DPT Admissions Coordinator [phone] 05/05/20  12:38 PM

## 2020-05-05 NOTE — Progress Notes (Addendum)
Family Medicine Teaching Service Daily Progress Note Intern Pager: [phone]  Patient name: BAYANI RENTERIA Medical record number: [id number] Date of birth: August 26, 1956 Age: 64 y.o. Gender: male  Primary Care Provider: [facility] Consultants: Vascular, Ortho Code Status: Full   Pt Overview and Major Events to Date:  Admitted 8/5  HD 8/5, 8/10 Abdominal aortogram and leg arteriogram: 8/6 Left below knee amputation: 8/9  Assessment and Plan: MAKAIO MACH is a 64 y.o. male presenting with gangrene of left toes. PMH is significant for CAD s/p 5 stents, CVA x2, MI x3, T2DM insulin dependent, ESRD , macular edema, HLD, HTN, HFrEF.   Severe PAD with ischemic left foot  Left toe dry gangrene: Stable. Patienthad left below knee amputationthis morning by vascular surgery -Antibiotics discontinued after 48 hours from the procedure  - keep L leg elevated to help with swelling -Tylenolq6scheduledfor mild pain, oxycodone 5 mg every 6 hours as needed for moderate/severe. Dilaudid 1mg  q3 hrs prn for moderate/severe pain.  ESRD: Chronic, stable. TThSatschedule. -Nephrology on board, appreciate assistance -Monitor electrolytes  Insulin-dependentT2DM: Chronic, stable. A1c 7.9. Takes Levemir 75U BID w/ novolog 5 BID PRN. -Lantus20units at bedtime. CBG today 110 -Hold home Levemir while receiving Lantus as above -Sensitive SSI -Monitor CBGs with meals and at bedtime - consult with diabetes coordinator  Constipation: Chronic, stable. Receiving opioids. -dulcolax10pmg prn - miralax - senna   Hypertension: Chronic, stable. [id number]. - Coreg 3.125 mg -Continue to hold home hydralazined/tlower pressure -Monitor BP  HFrEF: Chronic, stable. EF 45% in 2013. Likely in setting of above. [facility] -Nephrology for HD/volume control -Continue CAD medications as above  Esophageal dysphagia: Chronic, stable. [facility],structural abnormality present however does well with regular diet. -Monitor, maintain renal diet as is  CAD, s/p MI and CABG: Chronic, stable.  [facility]. No current anginal symptoms. -Continue home ASA 81 mg every other day -Continue home statin - coreg restarted   Previous CVA: Chronic, stable. No significant residual deficits. -ASA and statin as above  Hyperlipidemia: Chronic, stable. -Continue home atorvastatin 40 mg  FEN/GI:Renal carb modified  Prophylaxis: Heparin [place]  Disposition:Pendingplacement to CIR. Patient is medically stable for CIR   Subjective:   No acute events overnight. Patient had pain from his incision that morning and had received his oxycodone. Patient stated he would be interested in going to inpatient rehab. He denied any other concerns or complaints.   Objective: Temp:  [98.5 F (36.9 C)-99 F (37.2 C)] 98.5 F (36.9 C) (08/12 0356) Pulse Rate:  [78-91] 91 (08/12 0356) Resp:  [16-19] 18 (08/12 0700) BP: (93-147)/(52-91) 147/83 (08/12 0700) SpO2:  [94 %-100 %] 94 % (08/12 0356) Weight:  [332 kg] 124 kg (08/12 9518) Physical Exam: General: obese male, in no acute distress  Cardiovascular: RRR. No murmurs, rubs or gallops  Respiratory: CTA bilaterally. No wheezes, rales, or rhonchi  Abdomen: soft, non-distended, non-tender  Extremities: L BKA bandaged. RLE warm and dry with no edema. Distal pulse 2+   Laboratory: Recent Labs  Lab 05/02/20 0345 05/02/20 0345 05/02/20 1644 05/03/20 0411 05/04/20 0521  WBC 16.2*  --   --  13.1* 12.0*  HGB 8.5*   < > 7.9* 7.9* 8.1*  HCT 27.4*   < > 26.1* 25.6* 27.8*  PLT 365  --   --  342 386   < > = values in this interval not displayed.   Recent Labs  Lab 05/03/20 0411 05/04/20 0521 05/05/20 0338  NA  134* 132* 133*  K 6.1* 4.6 4.6  CL 92* 93* 95*  CO2 23 24 23   BUN 70* 39* 49*  CREATININE 11.64* 8.26* 10.30*  CALCIUM 8.3* 8.1* 8.2*  PROT 7.5 7.2 7.4  BILITOT 0.7 0.6 0.4   ALKPHOS 66 74 75  ALT 25 21 17   AST 27 36 41  GLUCOSE 226* 162* 112*     Imaging/Diagnostic Tests:  None recent   Theone Stanley, DO 05/05/2020, 7:43 AM PGY-1, [place] Intern pager: [phone], text pages welcome

## 2020-05-05 NOTE — Progress Notes (Addendum)
[facility] Progress Note   Subjective:  Seen in room - variable pain overnight. No CP/dyspnea. Will be coming to HD shortly.  Objective Vitals:   05/05/20 0700 05/05/20 1011 05/05/20 1012 05/05/20 1030  BP: (!) 147/83 109/77 127/76 114/76  Pulse:  67 83 88  Resp: 18 16 15 19   Temp:  98.5 F (36.9 C)    TempSrc:  Oral    SpO2:  96%    Weight:  123.7 kg    Height:       Physical Exam General:Well appearing man, NAD, overweight Heart:RRR; no murmur Lungs:CTAB Abdomen:soft, non-tender Extremities:No RLE edema; L BKA bandaged Dialysis Access:TDC in L chest (chronic)  Additional Objective Labs: Basic Metabolic Panel: Recent Labs  Lab 04/29/20 0428 04/30/20 0022 05/03/20 0411 05/04/20 0521 05/04/20 1001 05/05/20 0338  NA 137   < > 134* 132*  --  133*  K 4.7   < > 6.1* 4.6  --  4.6  CL 96*   < > 92* 93*  --  95*  CO2 27   < > 23 24  --  23  GLUCOSE 152*   < > 226* 162*  --  112*  BUN 31*   < > 70* 39*  --  49*  CREATININE 7.83*   < > 11.64* 8.26*  --  10.30*  CALCIUM 9.3   < > 8.3* 8.1*  --  8.2*  PHOS 5.9*  --  7.1*  --  6.1*  --    < > = values in this interval not displayed.   Liver Function Tests: Recent Labs  Lab 05/03/20 0411 05/04/20 0521 05/05/20 0338  AST 27 36 41  ALT 25 21 17   ALKPHOS 66 74 75  BILITOT 0.7 0.6 0.4  PROT 7.5 7.2 7.4  ALBUMIN 2.1* 2.1* 2.1*   CBC: Recent Labs  Lab 04/29/20 1737 04/29/20 1737 04/30/20 0022 04/30/20 2014 05/01/20 0608 05/01/20 1603 05/02/20 0345 05/02/20 0345 05/02/20 1644 05/03/20 0411 05/04/20 0521  WBC 13.0*   < > 16.2*  --  14.8*  --  16.2*  --   --  13.1* 12.0*  NEUTROABS 11.3*  --   --   --   --   --   --   --   --   --   --   HGB 8.4*   < > 7.5*   < > 7.7*   < > 8.5*   < > 7.9* 7.9* 8.1*  HCT 27.9*   < > 25.0*   < > 26.0*   < > 27.4*   < > 26.1* 25.6* 27.8*  MCV 86.1   < > 86.5  --  87.5  --  87.5  --   --  87.7 88.8  PLT 351   < > 350  --  348  --  365  --   --  342 386    < > = values in this interval not displayed.   Blood Culture    Component Value Date/Time   SDES BLOOD RIGHT FOREARM 04/28/2020 1517   SPECREQUEST  04/28/2020 1517    BOTTLES DRAWN AEROBIC AND ANAEROBIC Blood Culture results may not be optimal due to an inadequate volume of blood received in culture bottles   CULT  04/28/2020 1517    NO GROWTH 5 DAYS Performed at [facility], [place] [address] [place], [place] [address]    REPTSTATUS 05/03/2020 FINAL 04/28/2020 1517   Medications: . sodium chloride  10 mL/hr at 05/02/20 0945   . vitamin C  1,000 mg Oral BID  . aspirin  81 mg Oral QODAY  . atorvastatin  40 mg Oral QHS  . carvedilol  3.125 mg Oral BID WC  . cetaphil   Topical Daily  . Chlorhexidine Gluconate Cloth  6 each Topical Q0600  . cinacalcet  90 mg Oral Q supper  . darbepoetin (ARANESP) injection - DIALYSIS  100 mcg Intravenous Q Thu-HD  . doxercalciferol  6 mcg Intravenous Q T,Th,Sa-HD  . ferrous gluconate  324 mg Oral BID WC  . heparin injection (subcutaneous)  5,000 Units Subcutaneous Q8H  . insulin aspart  0-15 Units Subcutaneous TID WC  . insulin glargine  20 Units Subcutaneous QHS  . melatonin  5 mg Oral QHS  . menthol-cetylpyridinium  1 lozenge Oral Daily  . multivitamin  1 tablet Oral QHS  . pantoprazole  40 mg Oral Daily  . polyethylene glycol  17 g Oral Daily  . senna  1 tablet Oral Daily  . sevelamer carbonate  4.8 g Oral TID WC  . umeclidinium bromide  1 puff Inhalation Daily    Dialysis Orders: TTS @ [facility] 4.5h 400/800 126kg 2/2.25 bath Hep 12000bolusTDC -Mircera3mcg q 2wks - just reordered/not yet given,lastdose1/26/21 -Hectorol59mcg IV qHD  Assessment/Plan: 1. PAD with L leg ischemia + gangrene:X-ray w/o signs of osteomyelitis, on empiricVanc + Zosyn.VVS consulted-> underwent BLE arteriogram 8/6showingsevere bilateralPAD below the knees. 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 Donnetta Hutching. Being evaluated for CIR. 2. ESRD: Continue HD per  TTS schedule - HD today. 3. Hypotension/volume: Chronic low BP - Coreg on hold. EDW will be lower on discharge. 4. AnemiaofESRD: Hgb8.1.Aranesp 28mcg given with HD 8/5, will increase next dose to 129mcg.No IV iron due to allergy - started on PO iron BID instead (tsat 6%, ferritin 218) 5. Secondary Hyperparathyroidism:Ca at goal.Phos elevated.Continue VDRA, sensipar,and binders. 6. Constipation: Givenmag citrate + POdulcolax 8/8. 7. Nutrition- Renal diet w/fluid restrictions.  8. DMT2- Per primary 9. CAD s/p CABG - per primary 10. Hx CVA 11. COPD 65. OSA - on CPAP   Veneta Penton, PA-C 05/05/2020, 10:53 AM  [facility]  Pt seen, examined and agree w A/P as above.  [name]  MD 05/05/2020, 3:01 PM

## 2020-05-06 ENCOUNTER — Inpatient Hospital Stay (HOSPITAL_COMMUNITY)
Admission: RE | Admit: 2020-05-06 | Discharge: 2020-05-27 | DRG: 559 | Disposition: A | Discharge status: Skilled Nursing Facility | Payer: Medicare Other | Source: Intra-hospital | Attending: Physical Medicine and Rehabilitation | Admitting: Physical Medicine and Rehabilitation

## 2020-05-06 ENCOUNTER — Other Ambulatory Visit: Payer: Self-pay

## 2020-05-06 ENCOUNTER — Encounter: Payer: No Typology Code available for payment source | Admitting: Vascular Surgery

## 2020-05-06 ENCOUNTER — Encounter (HOSPITAL_COMMUNITY): Payer: No Typology Code available for payment source

## 2020-05-06 ENCOUNTER — Encounter (HOSPITAL_COMMUNITY): Payer: Self-pay | Admitting: Physical Medicine & Rehabilitation

## 2020-05-06 DIAGNOSIS — Z794 Long term (current) use of insulin: Secondary | ICD-10-CM

## 2020-05-06 DIAGNOSIS — R5381 Other malaise: Secondary | ICD-10-CM | POA: Diagnosis present

## 2020-05-06 DIAGNOSIS — Z6836 Body mass index (BMI) 36.0-36.9, adult: Secondary | ICD-10-CM | POA: Diagnosis not present

## 2020-05-06 DIAGNOSIS — J9811 Atelectasis: Secondary | ICD-10-CM | POA: Diagnosis present

## 2020-05-06 DIAGNOSIS — Z91041 Radiographic dye allergy status: Secondary | ICD-10-CM | POA: Diagnosis not present

## 2020-05-06 DIAGNOSIS — K5903 Drug induced constipation: Secondary | ICD-10-CM

## 2020-05-06 DIAGNOSIS — E1165 Type 2 diabetes mellitus with hyperglycemia: Secondary | ICD-10-CM | POA: Diagnosis present

## 2020-05-06 DIAGNOSIS — D72829 Elevated white blood cell count, unspecified: Secondary | ICD-10-CM | POA: Diagnosis not present

## 2020-05-06 DIAGNOSIS — E785 Hyperlipidemia, unspecified: Secondary | ICD-10-CM | POA: Diagnosis present

## 2020-05-06 DIAGNOSIS — Z4781 Encounter for orthopedic aftercare following surgical amputation: Secondary | ICD-10-CM | POA: Diagnosis present

## 2020-05-06 DIAGNOSIS — Z825 Family history of asthma and other chronic lower respiratory diseases: Secondary | ICD-10-CM

## 2020-05-06 DIAGNOSIS — I132 Hypertensive heart and chronic kidney disease with heart failure and with stage 5 chronic kidney disease, or end stage renal disease: Secondary | ICD-10-CM | POA: Diagnosis present

## 2020-05-06 DIAGNOSIS — E8889 Other specified metabolic disorders: Secondary | ICD-10-CM | POA: Diagnosis present

## 2020-05-06 DIAGNOSIS — K5900 Constipation, unspecified: Secondary | ICD-10-CM

## 2020-05-06 DIAGNOSIS — E1152 Type 2 diabetes mellitus with diabetic peripheral angiopathy with gangrene: Secondary | ICD-10-CM | POA: Diagnosis present

## 2020-05-06 DIAGNOSIS — R0989 Other specified symptoms and signs involving the circulatory and respiratory systems: Secondary | ICD-10-CM | POA: Diagnosis not present

## 2020-05-06 DIAGNOSIS — L299 Pruritus, unspecified: Secondary | ICD-10-CM | POA: Diagnosis not present

## 2020-05-06 DIAGNOSIS — J449 Chronic obstructive pulmonary disease, unspecified: Secondary | ICD-10-CM | POA: Diagnosis present

## 2020-05-06 DIAGNOSIS — E1122 Type 2 diabetes mellitus with diabetic chronic kidney disease: Secondary | ICD-10-CM | POA: Diagnosis present

## 2020-05-06 DIAGNOSIS — R109 Unspecified abdominal pain: Secondary | ICD-10-CM | POA: Diagnosis not present

## 2020-05-06 DIAGNOSIS — Z823 Family history of stroke: Secondary | ICD-10-CM

## 2020-05-06 DIAGNOSIS — D631 Anemia in chronic kidney disease: Secondary | ICD-10-CM | POA: Diagnosis present

## 2020-05-06 DIAGNOSIS — Z7982 Long term (current) use of aspirin: Secondary | ICD-10-CM

## 2020-05-06 DIAGNOSIS — D649 Anemia, unspecified: Secondary | ICD-10-CM | POA: Diagnosis not present

## 2020-05-06 DIAGNOSIS — Z992 Dependence on renal dialysis: Secondary | ICD-10-CM | POA: Diagnosis not present

## 2020-05-06 DIAGNOSIS — S88112A Complete traumatic amputation at level between knee and ankle, left lower leg, initial encounter: Secondary | ICD-10-CM | POA: Insufficient documentation

## 2020-05-06 DIAGNOSIS — Z83438 Family history of other disorder of lipoprotein metabolism and other lipidemia: Secondary | ICD-10-CM

## 2020-05-06 DIAGNOSIS — A419 Sepsis, unspecified organism: Secondary | ICD-10-CM | POA: Diagnosis not present

## 2020-05-06 DIAGNOSIS — N186 End stage renal disease: Secondary | ICD-10-CM | POA: Diagnosis present

## 2020-05-06 DIAGNOSIS — D638 Anemia in other chronic diseases classified elsewhere: Secondary | ICD-10-CM | POA: Diagnosis not present

## 2020-05-06 DIAGNOSIS — K219 Gastro-esophageal reflux disease without esophagitis: Secondary | ICD-10-CM | POA: Diagnosis present

## 2020-05-06 DIAGNOSIS — G8918 Other acute postprocedural pain: Secondary | ICD-10-CM

## 2020-05-06 DIAGNOSIS — E11311 Type 2 diabetes mellitus with unspecified diabetic retinopathy with macular edema: Secondary | ICD-10-CM | POA: Diagnosis present

## 2020-05-06 DIAGNOSIS — I252 Old myocardial infarction: Secondary | ICD-10-CM

## 2020-05-06 DIAGNOSIS — D62 Acute posthemorrhagic anemia: Secondary | ICD-10-CM | POA: Diagnosis present

## 2020-05-06 DIAGNOSIS — R140 Abdominal distension (gaseous): Secondary | ICD-10-CM | POA: Diagnosis not present

## 2020-05-06 DIAGNOSIS — Z89512 Acquired absence of left leg below knee: Secondary | ICD-10-CM

## 2020-05-06 DIAGNOSIS — I10 Essential (primary) hypertension: Secondary | ICD-10-CM | POA: Diagnosis not present

## 2020-05-06 DIAGNOSIS — Z8042 Family history of malignant neoplasm of prostate: Secondary | ICD-10-CM

## 2020-05-06 DIAGNOSIS — Z79899 Other long term (current) drug therapy: Secondary | ICD-10-CM | POA: Diagnosis not present

## 2020-05-06 DIAGNOSIS — Z9119 Patient's noncompliance with other medical treatment and regimen: Secondary | ICD-10-CM | POA: Diagnosis not present

## 2020-05-06 DIAGNOSIS — Z833 Family history of diabetes mellitus: Secondary | ICD-10-CM

## 2020-05-06 DIAGNOSIS — E118 Type 2 diabetes mellitus with unspecified complications: Secondary | ICD-10-CM

## 2020-05-06 DIAGNOSIS — Z888 Allergy status to other drugs, medicaments and biological substances status: Secondary | ICD-10-CM

## 2020-05-06 DIAGNOSIS — Z9114 Patient's other noncompliance with medication regimen: Secondary | ICD-10-CM

## 2020-05-06 DIAGNOSIS — N2581 Secondary hyperparathyroidism of renal origin: Secondary | ICD-10-CM | POA: Diagnosis present

## 2020-05-06 DIAGNOSIS — J988 Other specified respiratory disorders: Secondary | ICD-10-CM

## 2020-05-06 DIAGNOSIS — Z91199 Patient's noncompliance with other medical treatment and regimen due to unspecified reason: Secondary | ICD-10-CM

## 2020-05-06 DIAGNOSIS — I5022 Chronic systolic (congestive) heart failure: Secondary | ICD-10-CM | POA: Diagnosis present

## 2020-05-06 DIAGNOSIS — I739 Peripheral vascular disease, unspecified: Secondary | ICD-10-CM | POA: Diagnosis not present

## 2020-05-06 DIAGNOSIS — H3530 Unspecified macular degeneration: Secondary | ICD-10-CM | POA: Diagnosis present

## 2020-05-06 DIAGNOSIS — Z8249 Family history of ischemic heart disease and other diseases of the circulatory system: Secondary | ICD-10-CM

## 2020-05-06 DIAGNOSIS — F419 Anxiety disorder, unspecified: Secondary | ICD-10-CM | POA: Diagnosis present

## 2020-05-06 DIAGNOSIS — I2510 Atherosclerotic heart disease of native coronary artery without angina pectoris: Secondary | ICD-10-CM | POA: Diagnosis present

## 2020-05-06 DIAGNOSIS — Z713 Dietary counseling and surveillance: Secondary | ICD-10-CM

## 2020-05-06 DIAGNOSIS — Z955 Presence of coronary angioplasty implant and graft: Secondary | ICD-10-CM

## 2020-05-06 DIAGNOSIS — I9589 Other hypotension: Secondary | ICD-10-CM | POA: Diagnosis present

## 2020-05-06 DIAGNOSIS — I953 Hypotension of hemodialysis: Secondary | ICD-10-CM | POA: Diagnosis not present

## 2020-05-06 DIAGNOSIS — R112 Nausea with vomiting, unspecified: Secondary | ICD-10-CM

## 2020-05-06 DIAGNOSIS — Z20822 Contact with and (suspected) exposure to covid-19: Secondary | ICD-10-CM | POA: Diagnosis present

## 2020-05-06 DIAGNOSIS — Z951 Presence of aortocoronary bypass graft: Secondary | ICD-10-CM

## 2020-05-06 DIAGNOSIS — I951 Orthostatic hypotension: Secondary | ICD-10-CM | POA: Diagnosis present

## 2020-05-06 DIAGNOSIS — Z87891 Personal history of nicotine dependence: Secondary | ICD-10-CM

## 2020-05-06 DIAGNOSIS — G4730 Sleep apnea, unspecified: Secondary | ICD-10-CM | POA: Diagnosis present

## 2020-05-06 DIAGNOSIS — R110 Nausea: Secondary | ICD-10-CM | POA: Diagnosis not present

## 2020-05-06 DIAGNOSIS — Z8673 Personal history of transient ischemic attack (TIA), and cerebral infarction without residual deficits: Secondary | ICD-10-CM

## 2020-05-06 LAB — COMPREHENSIVE METABOLIC PANEL
ALT: 18 U/L (ref 0–44)
AST: 42 U/L — ABNORMAL HIGH (ref 15–41)
Albumin: 2.2 g/dL — ABNORMAL LOW (ref 3.5–5.0)
Alkaline Phosphatase: 80 U/L (ref 38–126)
Anion gap: 13 (ref 5–15)
BUN: 25 mg/dL — ABNORMAL HIGH (ref 8–23)
CO2: 27 mmol/L (ref 22–32)
Calcium: 8.7 mg/dL — ABNORMAL LOW (ref 8.9–10.3)
Chloride: 94 mmol/L — ABNORMAL LOW (ref 98–111)
Creatinine, Ser: 7.19 mg/dL — ABNORMAL HIGH (ref 0.61–1.24)
GFR calc Af Amer: 9 mL/min — ABNORMAL LOW (ref >60)
GFR calc non Af Amer: 7 mL/min — ABNORMAL LOW (ref >60)
Glucose, Bld: 97 mg/dL (ref 70–99)
Potassium: 4.1 mmol/L (ref 3.5–5.1)
Sodium: 134 mmol/L — ABNORMAL LOW (ref 135–145)
Total Bilirubin: 0.6 mg/dL (ref 0.3–1.2)
Total Protein: 7.4 g/dL (ref 6.5–8.1)

## 2020-05-06 LAB — GLUCOSE, CAPILLARY
Glucose-Capillary: 81 mg/dL (ref 70–99)
Glucose-Capillary: 115 mg/dL — ABNORMAL HIGH (ref 70–99)
Glucose-Capillary: 141 mg/dL — ABNORMAL HIGH (ref 70–99)
Glucose-Capillary: 123 mg/dL — ABNORMAL HIGH (ref 70–99)

## 2020-05-06 MED ORDER — GUAIFENESIN-DM 100-10 MG/5ML PO SYRP
5.0000 mL | ORAL_SOLUTION | Freq: Four times a day (QID) | ORAL | Status: DC | PRN
  Administered 2020-05-08: 10 mL via ORAL
  Filled 2020-05-06: qty 10

## 2020-05-06 MED ORDER — LIVING WELL WITH DIABETES BOOK
Freq: Once | Status: AC
  Filled 2020-05-06: qty 1

## 2020-05-06 MED ORDER — POLYETHYLENE GLYCOL 3350 17 G PO PACK
17.0000 g | PACK | Freq: Every day | ORAL | Status: DC | PRN
  Administered 2020-05-07: 17 g via ORAL
  Filled 2020-05-06: qty 1

## 2020-05-06 MED ORDER — DOXERCALCIFEROL 4 MCG/2ML IV SOLN
6.0000 ug | INTRAVENOUS | Status: DC
  Administered 2020-05-19 – 2020-05-26 (×2): 6 ug via INTRAVENOUS
  Filled 2020-05-06 (×9): qty 4

## 2020-05-06 MED ORDER — SENNOSIDES-DOCUSATE SODIUM 8.6-50 MG PO TABS
2.0000 | ORAL_TABLET | Freq: Every day | ORAL | Status: DC
  Administered 2020-05-07 – 2020-05-10 (×3): 2 via ORAL
  Filled 2020-05-06 (×4): qty 2

## 2020-05-06 MED ORDER — ASCORBIC ACID 500 MG PO TABS
1000.0000 mg | ORAL_TABLET | Freq: Two times a day (BID) | ORAL | Status: DC
  Administered 2020-05-06 – 2020-05-27 (×40): 1000 mg via ORAL
  Filled 2020-05-06 (×43): qty 2

## 2020-05-06 MED ORDER — PANTOPRAZOLE SODIUM 40 MG PO TBEC: 40.0000 mg | DELAYED_RELEASE_TABLET | Freq: Every day | ORAL | 0 refills | Status: AC

## 2020-05-06 MED ORDER — INSULIN DETEMIR 100 UNIT/ML ~~LOC~~ SOLN: 20.0000 [IU] | Freq: Two times a day (BID) | SUBCUTANEOUS | 11 refills | Status: DC

## 2020-05-06 MED ORDER — CINACALCET HCL 30 MG PO TABS: 90.0000 mg | ORAL_TABLET | Freq: Every day | ORAL | 0 refills | Status: DC

## 2020-05-06 MED ORDER — FERROUS GLUCONATE 324 (38 FE) MG PO TABS
324.0000 mg | ORAL_TABLET | Freq: Two times a day (BID) | ORAL | Status: DC
  Administered 2020-05-06 – 2020-05-27 (×42): 324 mg via ORAL
  Filled 2020-05-06 (×42): qty 1

## 2020-05-06 MED ORDER — CHLORHEXIDINE GLUCONATE CLOTH 2 % EX PADS
6.0000 | MEDICATED_PAD | Freq: Every day | CUTANEOUS | Status: DC
  Administered 2020-05-07 – 2020-05-21 (×13): 6 via TOPICAL

## 2020-05-06 MED ORDER — TIOTROPIUM BROMIDE MONOHYDRATE 18 MCG IN CAPS: 18.0000 ug | ORAL_CAPSULE | Freq: Every day | RESPIRATORY_TRACT | Status: DC

## 2020-05-06 MED ORDER — DIPHENHYDRAMINE HCL 12.5 MG/5ML PO ELIX: 12.5000 mg | ORAL_SOLUTION | Freq: Four times a day (QID) | ORAL | Status: DC | PRN

## 2020-05-06 MED ORDER — MILK AND MOLASSES ENEMA
1.0000 | Freq: Every day | RECTAL | Status: DC | PRN
  Filled 2020-05-06: qty 240

## 2020-05-06 MED ORDER — ALUMINUM HYDROXIDE GEL 320 MG/5ML PO SUSP
10.0000 mL | ORAL | Status: DC | PRN
  Filled 2020-05-06: qty 30

## 2020-05-06 MED ORDER — CINACALCET HCL 30 MG PO TABS
90.0000 mg | ORAL_TABLET | Freq: Every day | ORAL | Status: DC
  Administered 2020-05-06 – 2020-05-26 (×21): 90 mg via ORAL
  Filled 2020-05-06 (×21): qty 3

## 2020-05-06 MED ORDER — INSULIN DETEMIR 100 UNIT/ML ~~LOC~~ SOLN: 20.0000 [IU] | Freq: Every day | SUBCUTANEOUS | 11 refills | Status: DC

## 2020-05-06 MED ORDER — ASPIRIN 81 MG PO CHEW: 81.0000 mg | CHEWABLE_TABLET | ORAL | 0 refills | Status: DC

## 2020-05-06 MED ORDER — INSULIN GLARGINE 100 UNIT/ML ~~LOC~~ SOLN
20.0000 [IU] | Freq: Every day | SUBCUTANEOUS | Status: DC
  Administered 2020-05-06 – 2020-05-26 (×21): 20 [IU] via SUBCUTANEOUS
  Filled 2020-05-06 (×22): qty 0.2

## 2020-05-06 MED ORDER — OXYCODONE HCL 5 MG PO TABS
5.0000 mg | ORAL_TABLET | ORAL | Status: DC | PRN
  Administered 2020-05-06 – 2020-05-15 (×31): 10 mg via ORAL
  Administered 2020-05-15: 5 mg via ORAL
  Administered 2020-05-16 (×2): 10 mg via ORAL
  Filled 2020-05-06 (×37): qty 2

## 2020-05-06 MED ORDER — PROCHLORPERAZINE 25 MG RE SUPP: 12.5000 mg | Freq: Four times a day (QID) | RECTAL | Status: DC | PRN

## 2020-05-06 MED ORDER — CARVEDILOL 3.125 MG PO TABS: 3.1250 mg | ORAL_TABLET | Freq: Two times a day (BID) | ORAL | 0 refills | Status: DC

## 2020-05-06 MED ORDER — BISACODYL 10 MG RE SUPP
10.0000 mg | Freq: Every day | RECTAL | Status: DC | PRN
  Administered 2020-05-07: 10 mg via RECTAL
  Filled 2020-05-06: qty 1

## 2020-05-06 MED ORDER — MENTHOL 3 MG MT LOZG
1.0000 | LOZENGE | Freq: Every day | OROMUCOSAL | Status: DC
  Administered 2020-05-07 – 2020-05-18 (×11): 3 mg via ORAL
  Filled 2020-05-06 (×8): qty 9

## 2020-05-06 MED ORDER — PROCHLORPERAZINE MALEATE 5 MG PO TABS
5.0000 mg | ORAL_TABLET | Freq: Four times a day (QID) | ORAL | Status: DC | PRN
  Administered 2020-05-07 – 2020-05-16 (×3): 10 mg via ORAL
  Filled 2020-05-06 (×4): qty 2

## 2020-05-06 MED ORDER — CETAPHIL MOISTURIZING EX LOTN
1.0000 "application " | TOPICAL_LOTION | Freq: Every day | CUTANEOUS | Status: DC
  Administered 2020-05-07 – 2020-05-27 (×18): 1 via TOPICAL
  Filled 2020-05-06: qty 473

## 2020-05-06 MED ORDER — HEPARIN SODIUM (PORCINE) 5000 UNIT/ML IJ SOLN
5000.0000 [IU] | Freq: Three times a day (TID) | INTRAMUSCULAR | Status: DC
  Administered 2020-05-06 – 2020-05-27 (×55): 5000 [IU] via SUBCUTANEOUS
  Filled 2020-05-06 (×55): qty 1

## 2020-05-06 MED ORDER — CARVEDILOL 3.125 MG PO TABS
3.1250 mg | ORAL_TABLET | Freq: Two times a day (BID) | ORAL | Status: DC
  Administered 2020-05-06 – 2020-05-23 (×23): 3.125 mg via ORAL
  Filled 2020-05-06 (×30): qty 1

## 2020-05-06 MED ORDER — SEVELAMER CARBONATE 2.4 G PO PACK
4.8000 g | PACK | Freq: Three times a day (TID) | ORAL | Status: DC
  Administered 2020-05-06 – 2020-05-13 (×18): 4.8 g via ORAL
  Administered 2020-05-13: 2.4 g via ORAL
  Administered 2020-05-14 (×2): 4.8 g via ORAL
  Administered 2020-05-15 (×3): 2.4 g via ORAL
  Administered 2020-05-16 (×2): 4.8 g via ORAL
  Administered 2020-05-17: 2.4 g via ORAL
  Administered 2020-05-17 – 2020-05-27 (×22): 4.8 g via ORAL
  Filled 2020-05-06 (×63): qty 2

## 2020-05-06 MED ORDER — PANTOPRAZOLE SODIUM 40 MG PO TBEC
40.0000 mg | DELAYED_RELEASE_TABLET | Freq: Every day | ORAL | Status: DC
  Administered 2020-05-07 – 2020-05-27 (×21): 40 mg via ORAL
  Filled 2020-05-06 (×21): qty 1

## 2020-05-06 MED ORDER — RENA-VITE PO TABS
1.0000 | ORAL_TABLET | Freq: Every day | ORAL | Status: DC
  Administered 2020-05-06 – 2020-05-26 (×19): 1 via ORAL
  Filled 2020-05-06 (×21): qty 1

## 2020-05-06 MED ORDER — UMECLIDINIUM BROMIDE 62.5 MCG/INH IN AEPB
1.0000 | INHALATION_SPRAY | Freq: Every day | RESPIRATORY_TRACT | Status: DC
  Administered 2020-05-08 – 2020-05-19 (×11): 1 via RESPIRATORY_TRACT
  Filled 2020-05-06 (×2): qty 7

## 2020-05-06 MED ORDER — TOBRAMYCIN 0.3 % OP SOLN
1.0000 [drp] | Freq: Four times a day (QID) | OPHTHALMIC | Status: DC | PRN
  Filled 2020-05-06: qty 5

## 2020-05-06 MED ORDER — ATORVASTATIN CALCIUM 40 MG PO TABS
40.0000 mg | ORAL_TABLET | Freq: Every day | ORAL | Status: DC
  Administered 2020-05-06 – 2020-05-26 (×19): 40 mg via ORAL
  Filled 2020-05-06 (×21): qty 1

## 2020-05-06 MED ORDER — ASPIRIN 81 MG PO CHEW
81.0000 mg | CHEWABLE_TABLET | ORAL | Status: DC
  Administered 2020-05-07 – 2020-05-27 (×10): 81 mg via ORAL
  Filled 2020-05-06 (×10): qty 1

## 2020-05-06 MED ORDER — INSULIN ASPART 100 UNIT/ML ~~LOC~~ SOLN
0.0000 [IU] | Freq: Three times a day (TID) | SUBCUTANEOUS | Status: DC
  Administered 2020-05-06 – 2020-05-10 (×5): 2 [IU] via SUBCUTANEOUS
  Administered 2020-05-11: 3 [IU] via SUBCUTANEOUS
  Administered 2020-05-12 – 2020-05-25 (×13): 2 [IU] via SUBCUTANEOUS

## 2020-05-06 MED ORDER — HYDROMORPHONE HCL 2 MG PO TABS: 1.0000 mg | ORAL_TABLET | Freq: Four times a day (QID) | ORAL | 0 refills | Status: DC | PRN

## 2020-05-06 MED ORDER — MELATONIN 5 MG PO TABS
5.0000 mg | ORAL_TABLET | Freq: Every day | ORAL | Status: DC
  Administered 2020-05-06 – 2020-05-26 (×20): 5 mg via ORAL
  Filled 2020-05-06 (×21): qty 1

## 2020-05-06 MED ORDER — DARBEPOETIN ALFA 100 MCG/0.5ML IJ SOSY
100.0000 ug | PREFILLED_SYRINGE | INTRAMUSCULAR | Status: DC
  Administered 2020-05-12 – 2020-05-19 (×2): 100 ug via INTRAVENOUS
  Filled 2020-05-06 (×2): qty 0.5

## 2020-05-06 MED ORDER — PROCHLORPERAZINE EDISYLATE 10 MG/2ML IJ SOLN
5.0000 mg | Freq: Four times a day (QID) | INTRAMUSCULAR | Status: DC | PRN
  Administered 2020-05-21: 5 mg via INTRAMUSCULAR
  Filled 2020-05-06: qty 2

## 2020-05-06 MED ORDER — ACETAMINOPHEN 325 MG PO TABS
325.0000 mg | ORAL_TABLET | ORAL | Status: DC | PRN
  Administered 2020-05-09 – 2020-05-20 (×9): 650 mg via ORAL
  Filled 2020-05-06 (×4): qty 2
  Filled 2020-05-06: qty 1
  Filled 2020-05-06 (×5): qty 2

## 2020-05-06 NOTE — Progress Notes (Signed)
Occupational Therapy Treatment Patient Details Name: Jonathon Bailey MRN: [id number] DOB: August 04, 1956 Today's Date: 05/06/2020    History of present illness 64 year old with history of CAD s/p CABG, ESRD on HD, type 2 diabetes on insulin, and cerebrovascular disease presenting with sepsis due to wet gangrene of left foot. 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   OT comments  Pt received in bed, agreeable to OT session. Pt required minguard for lateral scoot transfer to drop arm recliner on pt's right side. Pt completed grooming while sitting in recliner. Pt demonstrated decreased knee flexion/extension, educated pt on importance of knee ROM and mobilizing LLE throughout the day. Began education on ECS during ADL. HR up to 123 with lateral scoot transfer, SpO2 >92% RA, increased DoE 3/4 with transfer. Pt with productive cough. Pt will continue to benefit from skilled OT services to maximize safety and independence with ADL/IADL and functional mobility. Will continue to follow acutely and progress as tolerated.    Follow Up Recommendations  SNF    Equipment Recommendations  Wheelchair (measurements OT);Wheelchair cushion (measurements OT);3 in 1 bedside commode;Other (comment) (possible hoyer lift)    Recommendations for Other Services      Precautions / Restrictions Precautions Precautions: Fall Precaution Comments: recent L BKA Restrictions Weight Bearing Restrictions: Yes LLE Weight Bearing: Non weight bearing       Mobility Bed Mobility Overal bed mobility: Needs Assistance Bed Mobility: Supine to Sit     Supine to sit: HOB elevated;Min guard     General bed mobility comments: minguard for safety, pt required increased time and effort with use of bed rails, increased DoE with bed mobility  Transfers Overall transfer level: Needs assistance Equipment used: Rolling walker (2 wheeled);1 person hand held assist Transfers: Lateral/Scoot Transfers Sit to Stand: Max  assist Stand pivot transfers: Mod assist      Lateral/Scoot Transfers: Min guard General transfer comment: pt with L UE on chair and R UE on stedy. pt unable to release the chair on first attempt and returning to sitting on chair surface. pt anxiousa with transfer. pt able to attempt and transfer hand on additional attempts. pt needs step by step instructions.     Balance Overall balance assessment: Needs assistance Sitting-balance support: Bilateral upper extremity supported;Feet supported Sitting balance-Leahy Scale: Fair     Standing balance support: Bilateral upper extremity supported;During functional activity Standing balance-Leahy Scale: Poor Standing balance comment: deferred                           ADL either performed or assessed with clinical judgement   ADL Overall ADL's : Needs assistance/impaired Eating/Feeding: Independent   Grooming: Wash/dry face;Set up;Sitting;Oral care Grooming Details (indicate cue type and reason): in recliner             Lower Body Dressing: Moderate assistance;Sitting/lateral leans   Toilet Transfer: Magazine features editor Details (indicate cue type and reason): for lateral scoot transfer to drop arm recliner         Functional mobility during ADLs: Supervision/safety;Rolling walker General ADL Comments: pt required minguard for lateral scoot transfer toward R side to drop arm recliner, HR up to 123 with mobility     Vision Baseline Vision/History: No visual deficits     Perception     Praxis      Cognition Arousal/Alertness: Awake/alert Behavior During Therapy: WFL for tasks assessed/performed Overall Cognitive Status: Within Functional Limits for tasks assessed  Exercises Exercises: Other exercises General Exercises - Upper Extremity Chair Push Up: AROM;10 reps;Seated Other Exercises Other Exercises: educated on knee extension and activation  of quad x10 reps Other Exercises: educated pt on proper use of IS, pt completed x5 to pull 250   Shoulder Instructions       General Comments HR up to 123 with exertion, increased DoE 3/4;pt with productive cought    Pertinent Vitals/ Pain       Pain Assessment: 0-10 Pain Score: 7  Faces Pain Scale: Hurts even more Pain Location: LLE pain after movement Pain Descriptors / Indicators: Sharp;Throbbing;Sore Pain Intervention(s): Monitored during session;Limited activity within patient's tolerance  Home Living Family/patient expects to be discharged to:: Skilled nursing facility Living Arrangements: Children Available Help at Discharge: Family;Available PRN/intermittently Type of Home: Apartment Home Access: Level entry     Home Layout: One level     Bathroom Shower/Tub: Occupational psychologist: Handicapped height Bathroom Accessibility: Yes   Home Equipment: Grab bars - toilet;Grab bars - tub/shower;Hand held shower head          Prior Functioning/Environment Level of Independence: Independent        Comments: as L foot pain increased more reliant on SPC for short distance ambulation, independent in ADLs/iADLs   Frequency  Min 2X/week        Progress Toward Goals  OT Goals(current goals can now be found in the care plan section)  Progress towards OT goals: Progressing toward goals  Acute Rehab OT Goals Patient Stated Goal: to get stronger UB OT Goal Formulation: With patient Time For Goal Achievement: 05/18/20 Potential to Achieve Goals: Good ADL Goals Pt Will Perform Lower Body Dressing: with mod assist;sit to/from stand;with adaptive equipment Pt Will Transfer to Toilet: with mod assist;stand pivot transfer;bedside commode Pt/caregiver will Perform Home Exercise Program: Increased strength;Both right and left upper extremity;With theraband;With written HEP provided;Independently Additional ADL Goal #1: pt will complete bed mobility mod as  precursor to adls.  Plan Discharge plan remains appropriate    Co-evaluation                 AM-PAC OT "6 Clicks" Daily Activity     Outcome Measure   Help from another person eating meals?: None Help from another person taking care of personal grooming?: A Little Help from another person toileting, which includes using toliet, bedpan, or urinal?: A Lot Help from another person bathing (including washing, rinsing, drying)?: A Lot Help from another person to put on and taking off regular upper body clothing?: A Little Help from another person to put on and taking off regular lower body clothing?: A Lot 6 Click Score: 16    End of Session Equipment Utilized During Treatment: Gait belt  OT Visit Diagnosis: Unsteadiness on feet (R26.81);Muscle weakness (generalized) (M62.81);Pain Pain - Right/Left: Left Pain - part of body: Leg   Activity Tolerance Patient tolerated treatment well   Patient Left in chair;with call bell/phone within reach;with chair alarm set   Nurse Communication Mobility status;Precautions;Weight bearing status        Time: 0623-7628 OT Time Calculation (min): 33 min  Charges: OT General Charges $OT Visit: 1 Visit OT Treatments $Self Care/Home Management : 23-37 mins  Helene Kelp OTR/L Acute Rehabilitation Services Office: [place] 05/06/2020, 11:03 AM

## 2020-05-06 NOTE — Progress Notes (Signed)
PMR Admission Coordinator Pre-Admission Assessment  Patient: Jonathon Bailey is an 64 y.o., male MRN: [id number] DOB: 08/21/1956 Height: 5' 11" (180.3 cm) Weight: 121.2 kg  Insurance Information HMO:     PPO:      PCP:      IPA:      80/20:      OTHER:  PRIMARY: Medicare A and B      Policy#: [id number]      Subscriber: pt CM Name:       Phone#:      Fax#:  Pre-Cert#: verified online      Employer:  Benefits:  Phone #:      Name:  Eff. Date: A 08/24/2010, B 12/24/2010     Deduct: $1484      Out of Pocket Max: n/a      Life Max: n/a CIR: 100%      SNF: 20 full days Outpatient: 80%     Co-Pay: 20% Home Health: 100%      Co-Pay:  DME: 80%     Co-Pay: 20% Providers: pt choice  SECOND[facility]: [facility]      Policy#: [id number]     Phone#:   Financial Counselor:       Phone#:   The "Data Collection Information Summary" for patients in Inpatient Rehabilitation Facilities with attached "Privacy Act Statement-Health Care Records" was provided and verbally reviewed with: Patient  Emergency Contact Information Contact Information    Name Relation Home Work Mobile   Sambrano, Jr. Fredrick Son [phone]  [phone]      Current Medical History  Patient Admitting Diagnosis: L BKA   History of Present Illness: Pt is a 64 y/o male with PMH Of CAD (s/p CABG), ESRD on HD TRS, DM, and cerebrovascular disease admitted on 04/27/2020 to [facility] with worsening L foot pain.  Found to have wet gangrene with sepsis. [facility] consulted and pt underwent ateriography of LLE on 8/6 per Dr. Dickson.  Pt continued to have progressive changes in LLE and was recommended for a BKA which he underwent with Dr. Early on 8/9.  Post op course pain management.  PMR was consulted by [facility] and pt was evaluated for CIR program.   Patient's medical record from [facility] has been reviewed by the rehabilitation admission coordinator and physician.  Past Medical History  Past Medical History:  Diagnosis Date  .  Anxiety   . Arthritis   . Asthma    "as a child"  . CAD (coronary artery disease)   . Chronic systolic heart failure (HCC) 09/21/2008   ECHO Feb 2013 showed LVEF low normal at 50-55%, +hypokinetic anterolateral wall and inferolateral wall.    . CKD (chronic kidney disease) stage 4, GFR 15-29 ml/min (HCC) 08/30/2009   Progressive renal failure since 2008, creatinine 1.2 in 2008 up to 3.5 in 2012 and 3.2-5.0 in 2013. All UA's 2011-13 showed >300 protein on dipstick. Work-up in May 2011 showed negative Urine IFE and SPEP, ultrasound showed 12-13 cm kidneys with increased echogenicity and UPC ratio was 1.5 gm proteinuria.  Hgb A1C's from 2011 to 2013 were all between 9-11.  Patient saw Dr. Early (vasc surgery) for HD access in Aug 2013 > vein mapping was done and Dr. Early felt the left arm (pt is R handed) was suitable for L arm Cimino radiocephalic fistula. Patient said he wasn't ready to consider doing dialysis and declined the surgery.     . Depression   . Diabetic retinopathy (HCC)   .   ESRD (end stage renal disease) on dialysis (HCC)   . Headache(784.0)    "maybe monthly" (03/15/2014)  . Hyperlipidemia   . Hypertension   . Macular edema   . Myocardial infarction (HCC)    status post MI x2 and 3 stents placed in 2003  . Obesity   . Sleep apnea    does not use CPAP  . Stroke (HCC) ~ 2007; ~1987   "weak on right side; messed w/right side of brain; cry all the time"  . Type II diabetes mellitus (HCC)    insulin dependent    Family History   family history includes Asthma in his mother; Cancer in his father; Deep vein thrombosis in his father; Diabetes in his father; Heart attack in his father; Heart disease in his father; Hyperlipidemia in his father and mother; Hypertension in his father and mother; Other in his father and sister; Prostate cancer in his father; Stroke in his father.  Prior Rehab/Hospitalizations Has the patient had prior rehab or hospitalizations prior to admission?  Yes  Has the patient had major surgery during 100 days prior to admission? Yes   Current Medications  Current Facility-Administered Medications:  .  0.9 %  sodium chloride infusion, , Intravenous, Continuous, Finucane, Elizabeth M, DO, Last Rate: 10 mL/hr at 05/02/20 0945, Restarted at 05/02/20 1124 .  acetaminophen (TYLENOL) tablet 325-650 mg, 325-650 mg, Oral, Q4H PRN **OR** acetaminophen (TYLENOL) suppository 325-650 mg, 325-650 mg, Rectal, Q4H PRN, Early, Todd F, MD .  ascorbic acid (VITAMIN C) tablet 1,000 mg, 1,000 mg, Oral, BID, Schertz, Robert, MD, 1,000 mg at 05/06/20 0940 .  aspirin chewable tablet 81 mg, 81 mg, Oral, QODAY, Simmons-Robinson, Makiera, MD, 81 mg at 05/05/20 0953 .  atorvastatin (LIPITOR) tablet 40 mg, 40 mg, Oral, QHS, Dickson, Christopher S, MD, 40 mg at 05/05/20 2229 .  bisacodyl (DULCOLAX) EC tablet 10 mg, 10 mg, Oral, Daily PRN, Schertz, Robert, MD .  carvedilol (COREG) tablet 3.125 mg, 3.125 mg, Oral, BID WC, Simmons-Robinson, Makiera, MD, 3.125 mg at 05/06/20 0940 .  cetaphil lotion, , Topical, Daily, Simmons-Robinson, Makiera, MD, Given at 05/06/20 0943 .  Chlorhexidine Gluconate Cloth 2 % PADS 6 each, 6 each, Topical, Q0600, Stovall, Kathryn R, PA-C, 6 each at 05/05/20 0615 .  cinacalcet (SENSIPAR) tablet 90 mg, 90 mg, Oral, Q supper, Dickson, Christopher S, MD, 90 mg at 05/04/20 1725 .  Darbepoetin Alfa (ARANESP) injection 100 mcg, 100 mcg, Intravenous, Q Thu-HD, Stovall, Kathryn R, PA-C, 100 mcg at 05/05/20 1157 .  doxercalciferol (HECTOROL) injection 6 mcg, 6 mcg, Intravenous, Q T,Th,Sa-HD, Dickson, Christopher S, MD, 6 mcg at 05/05/20 1158 .  ferrous gluconate (FERGON) tablet 324 mg, 324 mg, Oral, BID WC, Stovall, Kathryn R, PA-C, 324 mg at 05/06/20 0941 .  heparin injection 5,000 Units, 5,000 Units, Subcutaneous, Q8H, Brown, Carina M, MD, 5,000 Units at 05/06/20 0500 .  HYDROmorphone (DILAUDID) injection 1 mg, 1 mg, Intravenous, Q3H PRN, Rhyne, Samantha J,  PA-C, 1 mg at 05/06/20 0200 .  insulin aspart (novoLOG) injection 0-15 Units, 0-15 Units, Subcutaneous, TID WC, Anderson, Hannah C, DO, 2 Units at 05/04/20 1228 .  insulin glargine (LANTUS) injection 20 Units, 20 Units, Subcutaneous, QHS, Anderson, Hannah C, DO, 20 Units at 05/05/20 2230 .  melatonin tablet 5 mg, 5 mg, Oral, QHS, McQuilla, Jai B, MD, 5 mg at 05/05/20 2229 .  menthol-cetylpyridinium (CEPACOL) lozenge 3 mg, 1 lozenge, Oral, Daily, Simmons-Robinson, Makiera, MD, 3 mg at 05/06/20 0943 .  multivitamin (RENA-VIT)   tablet 1 tablet, 1 tablet, Oral, QHS, Dickson, Christopher S, MD, 1 tablet at 05/05/20 2230 .  oxyCODONE (Oxy IR/ROXICODONE) immediate release tablet 5-10 mg, 5-10 mg, Oral, Q4H PRN, Rhyne, Samantha J, PA-C, 5 mg at 05/06/20 0836 .  pantoprazole (PROTONIX) EC tablet 40 mg, 40 mg, Oral, Daily, Early, Todd F, MD, 40 mg at 05/06/20 0940 .  polyethylene glycol (MIRALAX / GLYCOLAX) packet 17 g, 17 g, Oral, Daily, Dickson, Christopher S, MD, 17 g at 05/06/20 0940 .  senna (SENOKOT) tablet 8.6 mg, 1 tablet, Oral, Daily, Brown, Carina M, MD, 8.6 mg at 05/06/20 0940 .  sevelamer carbonate (RENVELA) powder PACK 4.8 g, 4.8 g, Oral, TID WC, Dickson, Christopher S, MD, 4.8 g at 05/06/20 0941 .  tobramycin (TOBREX) 0.3 % ophthalmic solution 1 drop, 1 drop, Both Eyes, Q6H PRN, Dickson, Christopher S, MD .  umeclidinium bromide (INCRUSE ELLIPTA) 62.5 MCG/INH 1 puff, 1 puff, Inhalation, Daily, Dickson, Christopher S, MD, 1 puff at 05/06/20 0940  Patients Current Diet:  Diet Order            Diet renal/carb modified with fluid restriction Diet-HS Snack? Nothing; Fluid restriction: 1200 mL Fluid; Room service appropriate? Yes; Fluid consistency: Thin  Diet effective now                 Precautions / Restrictions Precautions Precautions: Fall Precaution Comments: recent L BKA Restrictions Weight Bearing Restrictions: Yes LLE Weight Bearing: Non weight bearing   Has the patient had 2  or more falls or a fall with injury in the past year? Yes  Prior Activity Level Limited Community (1-2x/wk): independent with ADLs/mobility, but more recently was requiring SPC and limited in activity 2/2 pain  Prior Functional Level Self Care: Did the patient need help bathing, dressing, using the toilet or eating? Independent  Indoor Mobility: Did the patient need assistance with walking from room to room (with or without device)? Independent  Stairs: Did the patient need assistance with internal or external stairs (with or without device)? Independent  Functional Cognition: Did the patient need help planning regular tasks such as shopping or remembering to take medications? Independent  Home Assistive Devices / Equipment Home Assistive Devices/Equipment: CBG Meter, Blood pressure cuff Home Equipment: Grab bars - toilet, Grab bars - tub/shower, Hand held shower head  Prior Device Use: Indicate devices/aids used by the patient prior to current illness, exacerbation or injury? SPC most immediately PTA  Current Functional Level Cognition  Overall Cognitive Status: Within Functional Limits for tasks assessed Orientation Level: Oriented X4    Extremity Assessment (includes Sensation/Coordination)  Upper Extremity Assessment: Generalized weakness  Lower Extremity Assessment: LLE deficits/detail LLE Deficits / Details: new BKA, decreased knee flexion/extension, limited hip flexion;educated pt on importance of mobilizing LLE, keeping knee in extension in preparation for prosthetic LLE: Unable to fully assess due to pain    ADLs  Overall ADL's : Needs assistance/impaired Eating/Feeding: Independent Grooming: Wash/dry face, Set up, Sitting, Oral care Grooming Details (indicate cue type and reason): in recliner Lower Body Dressing: Moderate assistance, Sitting/lateral leans Toilet Transfer: Min guard Toilet Transfer Details (indicate cue type and reason): for lateral scoot transfer to  drop arm recliner Functional mobility during ADLs: Supervision/safety, Rolling walker General ADL Comments: pt required minguard for lateral scoot transfer toward R side to drop arm recliner, HR up to 123 with mobility    Mobility  Overal bed mobility: Needs Assistance Bed Mobility: Supine to Sit Supine to sit: HOB elevated, Min guard General   bed mobility comments: minguard for safety, pt required increased time and effort with use of bed rails, increased DoE with bed mobility    Transfers  Overall transfer level: Needs assistance Equipment used: Rolling walker (2 wheeled), 1 person hand held assist Transfer via Lift Equipment: Stedy Transfers: Lateral/Scoot Transfers Sit to Stand: Max assist Stand pivot transfers: Mod assist  Lateral/Scoot Transfers: Min guard General transfer comment: pt with L UE on chair and R UE on stedy. pt unable to release the chair on first attempt and returning to sitting on chair surface. pt anxiousa with transfer. pt able to attempt and transfer hand on additional attempts. pt needs step by step instructions.     Ambulation / Gait / Stairs / Wheelchair Mobility       Posture / Balance Balance Overall balance assessment: Needs assistance Sitting-balance support: Bilateral upper extremity supported, Feet supported Sitting balance-Leahy Scale: Fair Standing balance support: Bilateral upper extremity supported, During functional activity Standing balance-Leahy Scale: Poor Standing balance comment: deferred    Special needs/care consideration Dialysis: Hemodialysis Tuesday, Thursday and Saturday, Skin incision to LLE and Diabetic management yes   Previous Home Environment (from acute therapy documentation) Living Arrangements: Children Available Help at Discharge: Family, Available PRN/intermittently Type of Home: Apartment Home Layout: One level Home Access: Level entry Bathroom Shower/Tub: Walk-in shower Bathroom Toilet: Handicapped height Bathroom  Accessibility: Yes Home Care Services: No  Discharge Living Setting Plans for Discharge Living Setting: Patient's home Type of Home at Discharge: Apartment Discharge Home Layout: One level Discharge Home Access: Level entry Discharge Bathroom Shower/Tub: Walk-in shower Discharge Bathroom Toilet: Standard Discharge Bathroom Accessibility: Yes How Accessible: Accessible via wheelchair Does the patient have any problems obtaining your medications?: No  Social/Family/Support Systems Patient Roles: Parent Contact Information: son, Fredrick Holderman Jr is a student at [employer] studying anesthesiology Anticipated Caregiver: Fredrick Jr Anticipated Caregiver's Contact Information: [phone] Ability/Limitations of Caregiver: he is a student at [employer], will not be able to provide assist when in class/band practice Caregiver Availability: Evenings only Discharge Plan Discussed with Primary Caregiver: Yes (patient/mod I goals) Is Caregiver In Agreement with Plan?: Yes Does Caregiver/Family have Issues with Lodging/Transportation while Pt is in Rehab?: No  Goals Patient/Family Goal for Rehab: PT/OT supervision to mod I Expected length of stay: 14-16 days Pt/Family Agrees to Admission and willing to participate: Yes Program Orientation Provided & Reviewed with Pt/Caregiver Including Roles  & Responsibilities: Yes  Barriers to Discharge: Decreased caregiver support  Barriers to Discharge Comments: pt with medicare A and B and VA benefits  Decrease burden of Care through IP rehab admission: n/a  Possible need for SNF placement upon discharge: Not anticipated, but possible.   Patient Condition: I have reviewed medical records from [facility], spoken with CM, and patient. I met with patient at the bedside for inpatient rehabilitation assessment.  Patient will benefit from ongoing PT and OT, can actively participate in 3 hours of therapy a day 5 days of the week, and can make measurable gains  during the admission.  Patient will also benefit from the coordinated team approach during an Inpatient Acute Rehabilitation admission.  The patient will receive intensive therapy as well as Rehabilitation physician, nursing, social worker, and care management interventions.  Due to safety, skin/wound care, disease management, medication administration, pain management and patient education the patient requires 24 hour a day rehabilitation nursing.  The patient is currently min to max with mobility and basic ADLs.  Discharge setting and therapy post discharge at   home is anticipated.  Patient has agreed to participate in the Acute Inpatient Rehabilitation Program and will admit today.  Preadmission Screen Completed By:  Nikiesha Milford E Kynzley Dowson, PT, DPT 05/06/2020 11:33 AM ______________________________________________________________________   Discussed status with Dr. Patel on 05/06/20  at 12:08 PM  and received approval for admission today.  Admission Coordinator:  Arvie Bartholomew E Amzie Sillas, PT, DPT time 12:08 PM /Date 05/06/20    Assessment/Plan: Diagnosis: L BKA  1. Does the need for close, 24 hr/day Medical supervision in concert with the patient's rehab needs make it unreasonable for this patient to be served in a less intensive setting? Potentially  2. 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 3. Due to bowel management, safety, skin/wound care, disease management, pain management and patient education, does the patient require 24 hr/day rehab nursing? Potentially 4. Does the patient require coordinated care of a physician, rehab nurse, PT, OT to address physical and functional deficits in the context of the above medical diagnosis(es)? Potentially Addressing deficits in the following areas: balance, endurance, locomotion, strength, transferring, bathing, dressing, toileting and psychosocial support 5. Can the  patient actively participate in an intensive therapy program of at least 3 hrs of therapy 5 days a week? Yes 6. The potential for patient to make measurable gains while on inpatient rehab is excellent 7. Anticipated functional outcomes upon discharge from inpatient rehab: supervision and min assist PT, supervision and min assist OT, n/a SLP 8. Estimated rehab length of stay to reach the above functional goals is: 10-13 days. 9. Anticipated discharge destination: Home 10. Overall Rehab/Functional Prognosis: good   MD Signature: Ankit Patel, MD, ABPMR 

## 2020-05-06 NOTE — H&P (Signed)
Physical Medicine and Rehabilitation Admission H&P    Chief Complaint  Patient presents with  .  Left foot gangrene s/p L-BKA    HPI: Jonathon Bailey. Jonathon Bailey is a 64 year old male with history of CAD s/p CABG, macular degeneration, ESRD-HD TTS, T2DM who was admitted 04/27/2020 with sepsis due to wet gangrene left foot.  History taken from chart review and patient.  He was started on IV antibiotics with attempts at limb salvage.  Patient with unfavorable anatomy for revascularization attempts and BKA recommended due to concerns of wound healing.  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  Postop reported visual changes and CT head done which was unremarkable for acute intracranial process.  He has had issues with hypotension and carvedilol was held briefly then resumed at lower dose.  Bowel program has been augmented to manage constipation.  Hospital course further complicated by acute on chronic anemia managed with increasing Aranesp dose--unable to tolerate IV iron due to allergy.  Therapy evaluations completed revealing DOE as well as tachycardia due to debility as well as limitations due to left BKA.  CIR was recommended due to functional decline.  Please see preadmission assessment from earlier today as well.   Review of Systems  Constitutional: Negative for chills and fever.  HENT: Negative for hearing loss and tinnitus.   Eyes: Negative for blurred vision and double vision.  Respiratory: Negative for cough and shortness of breath.   Cardiovascular: Negative for chest pain and palpitations.  Gastrointestinal: Negative for heartburn, nausea and vomiting.  Musculoskeletal: Positive for myalgias. Negative for back pain.  Skin: Negative for rash.  Neurological: Negative for dizziness, speech change, focal weakness, weakness and headaches.  Psychiatric/Behavioral: The patient has insomnia.   All other systems reviewed and are negative.    Past Medical History:   Diagnosis Date  . Anxiety   . Arthritis   . Asthma    "as a child"  . CAD (coronary artery disease)   . Chronic systolic heart failure ([place]) 09/21/2008   ECHO Feb 2013 showed LVEF low normal at 50-55%, +hypokinetic anterolateral wall and inferolateral wall.    . CKD (chronic kidney disease) stage 4, GFR 15-29 ml/min (HCC) 08/30/2009   Progressive renal failure since 2008, creatinine 1.2 in 2008 up to 3.5 in 2012 and 3.2-5.0 in 2013. All UA's 2011-13 showed >300 protein on dipstick. Work-up in May 2011 showed negative Urine IFE and SPEP, ultrasound showed 12-13 cm kidneys with increased echogenicity and UPC ratio was 1.5 gm proteinuria.  Hgb A1C's from 2011 to 2013 were all between 9-11.  Patient saw Dr. Donnetta Hutching (vasc surgery) for HD access in Aug 2013 > vein mapping was done and Dr. Donnetta Hutching felt the left arm (pt is R handed) was suitable for L arm Cimino radiocephalic fistula. Patient said he wasn't ready to consider doing dialysis and declined the surgery.     . Depression   . Diabetic retinopathy ([place])   . ESRD (end stage renal disease) on dialysis ([place])   . GYJEHUDJ(497.0)    "maybe monthly" (03/15/2014)  . Hyperlipidemia   . Hypertension   . Macular edema   . Myocardial infarction [facility])    status post MI x2 and 3 stents placed in 2003  . Obesity   . Sleep apnea    does not use CPAP  . Stroke [facility]) ~ 2007; ~1987   "weak on right side; messed w/right side of brain; cry all the time"  . Type  II diabetes mellitus ([place])    insulin dependent    Past Surgical History:  Procedure Laterality Date  . ABDOMINAL AORTOGRAM W/LOWER EXTREMITY N/A 04/29/2020   Procedure: ABDOMINAL AORTOGRAM W/LOWER EXTREMITY;  Surgeon: Angelia Mould, MD;  Location: [place] CV LAB;  Service: Cardiovascular;  Laterality: N/A;  . AMPUTATION Left 05/02/2020   Procedure: AMPUTATION BELOW KNEE;  Surgeon: Rosetta Posner, MD;  Location: [place];  Service: Vascular;  Laterality: Left;  . AV FISTULA PLACEMENT   10/17/2012   Procedure: ARTERIOVENOUS (AV) FISTULA CREATION;  Surgeon: Mal Misty, MD;  Location: [place];  Service: Vascular;  Laterality: Left;  . BONE EXOSTOSIS EXCISION Right 06/04/2019   Procedure: EXOSTOSIS EXCISION;  Surgeon: Leanora Cover, MD;  Location: [place];  Service: Orthopedics;  Laterality: Right;  . CAPD INSERTION N/A 03/15/2014   Procedure: LAPAROSCOPIC INSERTION CONTINUOUS AMBULATORY PERITONEAL DIALYSIS CATHETER, LAPARASCOPIC INCISIONAL HERNIA  REPAIR  WITH MESH, OMENTOPEXY AND LYSIS OF ADHESIONS;  Surgeon: [name], MD;  Location: [place];  Service: General;  Laterality: N/A;  . CORONARY ANGIOPLASTY WITH STENT PLACEMENT  ~ 2002   "3"  . CORONARY ANGIOPLASTY WITH STENT PLACEMENT  01/24/2004   successful PCI/stenting RCA  drug eluting cypher stent  . CORONARY ANGIOPLASTY WITH STENT PLACEMENT  01/28/2004   successful stentin of a large bifurcation marginal branch of the ramus intermediate vessel  . FLEXIBLE SIGMOIDOSCOPY N/A 09/05/2014   Procedure: FLEXIBLE SIGMOIDOSCOPY;  Surgeon: Cleotis Nipper, MD;  Location: [facility] ENDOSCOPY;  Service: Endoscopy;  Laterality: N/A;  . HERNIA REPAIR    . INCISION AND DRAINAGE ABSCESS N/A 09/06/2014   Procedure: REMOVAL OF PD CATH;  Surgeon: Coralie Keens, MD;  Location: [place];  Service: General;  Laterality: N/A;  . INCISIONAL HERNIA REPAIR  03/15/2014  . LAPAROSCOPIC LYSIS OF ADHESIONS  03/15/2014  . NM MYOCAR PERF WALL MOTION  02/06/2012   normal perfusion scan  . PERITONEAL CATHETER INSERTION  03/15/2014  . REFRACTIVE SURGERY Bilateral   . SHUNTOGRAM N/A 03/11/2013   Procedure: Fistulogram;  Surgeon: Conrad [place], MD;  Location: [facility] CATH LAB;  Service: Cardiovascular;  Laterality: N/A;  . TENDON TRANSFER Right 06/04/2019   Procedure: RIGHT HAND EXTENSOR TENDON TRANSFER TO SMALL FINGER;  Surgeon: Leanora Cover, MD;  Location: [place];  Service: Orthopedics;  Laterality: Right;  . UMBILICAL HERNIA REPAIR       Family History  Problem Relation Age of Onset  . Asthma Mother   . Hyperlipidemia Mother   . Hypertension Mother   . Stroke Father   . Heart attack Father   . Prostate cancer Father   . Deep vein thrombosis Father   . Cancer Father   . Diabetes Father   . Hyperlipidemia Father   . Hypertension Father   . Other Father        varicose veins  . Heart disease Father        before age 13  . Other Sister        varicose veins    Social History:  Retired. Was in the [employer] then did multiple other jobs. he reports that he quit smoking about 33 years ago. His smoking use included cigarettes. He has a 15.00 pack-year smoking history. He has never used smokeless tobacco. He reports previous alcohol use. He reports that he does not use drugs.    Allergies  Allergen Reactions  . Ferumoxytol Itching       . Iron  Itching        . Iodinated Diagnostic Agents Itching    Medications Prior to Admission  Medication Sig Dispense Refill  . acetaminophen (TYLENOL) 325 MG tablet Take 650 mg by mouth every 6 (six) hours as needed for pain.    Marland Kitchen albuterol (PROVENTIL HFA;VENTOLIN HFA) 108 (90 BASE) MCG/ACT inhaler Inhale 2 puffs into the lungs every 4 (four) hours as needed for wheezing. For wheezing    . atorvastatin (LIPITOR) 40 MG tablet Take 40 mg by mouth at bedtime.    . B Complex-C-Folic Acid (DIALYVITE 423) 0.8 MG TABS Take 1 tablet by mouth daily.    . carvedilol (COREG) 12.5 MG tablet Take 6.25 mg by mouth 3 (three) times a week.     . diphenhydrAMINE (BENADRYL) 25 mg capsule Take 1 tablet by mouth every 6 (six) hours as needed for allergies.    . ferrous gluconate (FERGON) 324 MG tablet Take 324 mg by mouth daily.    . fluticasone (FLONASE) 50 MCG/ACT nasal spray Place 2 sprays into both nostrils daily.    . insulin aspart (NOVOLOG) 100 UNIT/ML FlexPen Inject 5 Units into the skin 2 (two) times daily between meals as needed for high blood sugar. Sliding Scale, if over 536, one  click per every 10    . lidocaine (XYLOCAINE) 5 % ointment Apply 1 application topically 3 (three) times daily as needed for mild pain or moderate pain.     . Methoxy PEG-Epoetin Beta (MIRCERA IJ) Inject 30 mcg as directed.    . mupirocin ointment (BACTROBAN) 2 % Apply 1 application topically daily.    . Omega-3 Fatty Acids (FISH OIL) 1000 MG CAPS Take 1 capsule by mouth daily.    Marland Kitchen oxycodone (OXY-IR) 5 MG capsule Take 5 mg by mouth every other day.    . polyethylene glycol (MIRALAX / GLYCOLAX) packet Take 17 g by mouth daily. 14 each 0  . sevelamer carbonate (RENVELA) 2.4 g PACK Take 2.4-4.8 g by mouth See admin instructions. Take 4.8 g (two packets) by mouth three times a day with meals and take 2.4 g (one packet) with snacks    . tiotropium (SPIRIVA) 18 MCG inhalation capsule Place 18 mcg into inhaler and inhale daily.    Marland Kitchen tiZANidine (ZANAFLEX) 4 MG capsule Take 2 mg by mouth every evening.    . [DISCONTINUED] insulin detemir (LEVEMIR) 100 UNIT/ML injection Inject 75 Units into the skin 2 (two) times daily.    Marland Kitchen glucose blood test strip 1 each by Other route as needed for other. Use as instructed    . Skin Protectants, Misc. (EUCERIN) cream Apply 1 application topically as needed for dry skin.      Drug Regimen Review  Drug regimen was reviewed and remains appropriate with no significant issues identified  Home: Home Living Family/patient expects to be discharged to:: Skilled nursing facility Living Arrangements: Children Available Help at Discharge: Family, Available PRN/intermittently Type of Home: Apartment Home Access: Level entry Home Layout: One level Bathroom Shower/Tub: Multimedia programmer: Handicapped height Bathroom Accessibility: Yes Home Equipment: Grab bars - toilet, Grab bars - tub/shower, Hand held shower head   Functional History: Prior Function Level of Independence: Independent Comments: as L foot pain increased more reliant on SPC for short distance  ambulation, independent in ADLs/iADLs  Functional Status:  Mobility: Bed Mobility Overal bed mobility: Needs Assistance Bed Mobility: Supine to Sit Supine to sit: HOB elevated, Min guard General bed mobility comments: minguard for safety,  pt required increased time and effort with use of bed rails, increased DoE with bed mobility Transfers Overall transfer level: Needs assistance Equipment used: Rolling walker (2 wheeled), 1 person hand held assist Transfer via Lift Equipment: Stedy Transfers: Lateral/Scoot Transfers Sit to Stand: Max assist Stand pivot transfers: Mod assist  Lateral/Scoot Transfers: Min guard General transfer comment: pt with L UE on chair and R UE on stedy. pt unable to release the chair on first attempt and returning to sitting on chair surface. pt anxiousa with transfer. pt able to attempt and transfer hand on additional attempts. pt needs step by step instructions.       ADL: ADL Overall ADL's : Needs assistance/impaired Eating/Feeding: Independent Grooming: Wash/dry face, Set up, Sitting, Oral care Grooming Details (indicate cue type and reason): in recliner Lower Body Dressing: Moderate assistance, Sitting/lateral leans Toilet Transfer: Magazine features editor Details (indicate cue type and reason): for lateral scoot transfer to drop arm recliner Functional mobility during ADLs: Supervision/safety, Rolling walker General ADL Comments: pt required minguard for lateral scoot transfer toward R side to drop arm recliner, HR up to 123 with mobility  Cognition: Cognition Overall Cognitive Status: Within Functional Limits for tasks assessed Orientation Level: Oriented X4 Cognition Arousal/Alertness: Awake/alert Behavior During Therapy: WFL for tasks assessed/performed Overall Cognitive Status: Within Functional Limits for tasks assessed   Blood pressure 111/83, pulse 91, temperature 98.2 F (36.8 C), temperature source Oral, resp. rate 19, height 5\' 11"   (1.803 m), weight 121.2 kg, SpO2 97 %. Physical Exam Vitals and nursing note reviewed.  Constitutional:      Appearance: He is obese.  HENT:     Head: Normocephalic and atraumatic.     Right Ear: External ear normal.     Left Ear: External ear normal.     Nose: Nose normal.  Eyes:     General:        Right eye: No discharge.        Left eye: No discharge.     Extraocular Movements: Extraocular movements intact.  Cardiovascular:     Rate and Rhythm: Normal rate and regular rhythm.  Pulmonary:     Effort: Pulmonary effort is normal. No respiratory distress.     Breath sounds: Normal breath sounds. No stridor.  Abdominal:     General: Abdomen is flat. Bowel sounds are normal.     Tenderness: There is no abdominal tenderness.  Musculoskeletal:     Cervical back: Normal range of motion and neck supple.     Comments: Left BKA with edema and tenderness  Skin:    Comments: Left BKA with dressing CDI  Neurological:     Mental Status: He is alert and oriented to person, place, and time.     Comments: Alert Motor: Bilateral upper extremities: 5/5 proximal nose Left lower extremity: 4+/5 proximal distal Right lower extremity: Hip flexion 4/5 (pain inhibition) Limited extension of the right knee Sensation intact to light touch  Psychiatric:        Mood and Affect: Mood normal.        Behavior: Behavior normal.     Results for orders placed or performed during the hospital encounter of 04/27/20 (from the past 48 hour(s))  Glucose, capillary     Status: Abnormal   Collection Time: 05/04/20  4:12 PM  Result Value Ref Range   Glucose-Capillary 117 (H) 70 - 99 mg/dL    Comment: Glucose reference range applies only to samples taken after fasting for at least  8 hours.  Glucose, capillary     Status: Abnormal   Collection Time: 05/04/20  9:33 PM  Result Value Ref Range   Glucose-Capillary 129 (H) 70 - 99 mg/dL    Comment: Glucose reference range applies only to samples taken after  fasting for at least 8 hours.  Glucose, capillary     Status: Abnormal   Collection Time: 05/04/20 10:15 PM  Result Value Ref Range   Glucose-Capillary 129 (H) 70 - 99 mg/dL    Comment: Glucose reference range applies only to samples taken after fasting for at least 8 hours.  Comprehensive metabolic panel     Status: Abnormal   Collection Time: 05/05/20  3:38 AM  Result Value Ref Range   Sodium 133 (L) 135 - 145 mmol/L   Potassium 4.6 3.5 - 5.1 mmol/L   Chloride 95 (L) 98 - 111 mmol/L   CO2 23 22 - 32 mmol/L   Glucose, Bld 112 (H) 70 - 99 mg/dL    Comment: Glucose reference range applies only to samples taken after fasting for at least 8 hours.   BUN 49 (H) 8 - 23 mg/dL   Creatinine, Ser 10.30 (H) 0.61 - 1.24 mg/dL   Calcium 8.2 (L) 8.9 - 10.3 mg/dL   Total Protein 7.4 6.5 - 8.1 g/dL   Albumin 2.1 (L) 3.5 - 5.0 g/dL   AST 41 15 - 41 U/L   ALT 17 0 - 44 U/L   Alkaline Phosphatase 75 38 - 126 U/L   Total Bilirubin 0.4 0.3 - 1.2 mg/dL   GFR calc non Af Amer 5 (L) >60 mL/min   GFR calc Af Amer 6 (L) >60 mL/min   Anion gap 15 5 - 15    Comment: Performed at [facility] [address], [place], [place] [address]  Glucose, capillary     Status: Abnormal   Collection Time: 05/05/20  8:05 AM  Result Value Ref Range   Glucose-Capillary 110 (H) 70 - 99 mg/dL    Comment: Glucose reference range applies only to samples taken after fasting for at least 8 hours.  Glucose, capillary     Status: None   Collection Time: 05/05/20  4:41 PM  Result Value Ref Range   Glucose-Capillary 81 70 - 99 mg/dL    Comment: Glucose reference range applies only to samples taken after fasting for at least 8 hours.  Glucose, capillary     Status: Abnormal   Collection Time: 05/05/20 10:12 PM  Result Value Ref Range   Glucose-Capillary 112 (H) 70 - 99 mg/dL    Comment: Glucose reference range applies only to samples taken after fasting for at least 8 hours.  Comprehensive metabolic panel     Status:  Abnormal   Collection Time: 05/06/20 12:50 AM  Result Value Ref Range   Sodium 134 (L) 135 - 145 mmol/L   Potassium 4.1 3.5 - 5.1 mmol/L   Chloride 94 (L) 98 - 111 mmol/L   CO2 27 22 - 32 mmol/L   Glucose, Bld 97 70 - 99 mg/dL    Comment: Glucose reference range applies only to samples taken after fasting for at least 8 hours.   BUN 25 (H) 8 - 23 mg/dL   Creatinine, Ser 7.19 (H) 0.61 - 1.24 mg/dL   Calcium 8.7 (L) 8.9 - 10.3 mg/dL   Total Protein 7.4 6.5 - 8.1 g/dL   Albumin 2.2 (L) 3.5 - 5.0 g/dL   AST 42 (H) 15 -  41 U/L   ALT 18 0 - 44 U/L   Alkaline Phosphatase 80 38 - 126 U/L   Total Bilirubin 0.6 0.3 - 1.2 mg/dL   GFR calc non Af Amer 7 (L) >60 mL/min   GFR calc Af Amer 9 (L) >60 mL/min   Anion gap 13 5 - 15    Comment: Performed at [facility] [address], [place], [place] [address]  Glucose, capillary     Status: None   Collection Time: 05/06/20  8:28 AM  Result Value Ref Range   Glucose-Capillary 81 70 - 99 mg/dL    Comment: Glucose reference range applies only to samples taken after fasting for at least 8 hours.  Glucose, capillary     Status: Abnormal   Collection Time: 05/06/20 10:58 AM  Result Value Ref Range   Glucose-Capillary 115 (H) 70 - 99 mg/dL    Comment: Glucose reference range applies only to samples taken after fasting for at least 8 hours.   No results found.     Medical Problem List and Plan: 1.  Limitations with mobility, transfers, endurance, self-care secondary to left BKA.  -patient may not shower  -ELOS/Goals: 12-16 days/supervision/min a  Admit to CIR 2.  Antithrombotics: -DVT/anticoagulation:  Pharmaceutical: Heparin  -antiplatelet therapy: ASA daily  3. Pain Management: Oxycodone as needed --DC IV Dilaudid (was using once a day on average)    Monitor with increased exertion. 4. Mood: LCSW to   -antipsychotic agents: N/a 5. Neuropsych: This patient is  capable of making decisions on his own behalf. 6. Skin/Wound Care: Daily  dressing changes. Monitor wound for healing.  Vitamin C for healing.  Prosource added to promote wound healing.  7. Fluids/Electrolytes/Nutrition: Monitor I/O.  Modified/renal diet with 1200 ccFR/day. Labs monitored with HD. 8.  T2DM with hyperglycemia:  Hgb A1c- 7.9.  Continue Lantus 20 units daily (On levemir 75 mg bid/Novolog 5 untis bid PTA- reports eats better at home).  Continue to monitor BS ac/hs and use SSI for elevated blood sugars.  Monitor with increased mobility 9.  ESRD: Schedule hemodialysis at the end of the day on TTS to help with tolerance of activity.  Metabolic bone disease managed with Hectorol and Renvela. 10.  COPD: Respiratory status stable on Incruse daily 11.  Anemia of chronic disease: Serial CBCs with hemodialysis.  On IV Aranesp weekly with [place]on twice daily  CBC ordered. 12.  History of CAD: Carvedilol decreased due to hypotension --continue Coreg 3.125 mg bid with Lipitor and ASA.  Monitor with increased mobility.   Bary Leriche, PA-C 05/06/2020  I have personally performed a face to face diagnostic evaluation, including, but not limited to relevant history and physical exam findings, of this patient and developed relevant assessment and plan.  Additionally, I have reviewed and concur with the physician assistant's documentation above.  Delice Lesch, MD, ABPMR  The patient's status has not changed. The original post admission physician evaluation remains appropriate, and any changes from the pre-admission screening or documentation from the acute chart are noted above.   Delice Lesch, MD, ABPMR

## 2020-05-06 NOTE — H&P (Signed)
Physical Medicine and Rehabilitation Admission H&P    Chief Complaint  Patient presents with  .  Left foot gangrene s/p L-BKA    HPI: Jonathon Bailey. [place] is a 64 year old male with history of CAD s/p CABG, macular degeneration, ESRD-HD TTS, T2DM who was admitted 04/27/2020 with sepsis due to wet gangrene left foot.  History taken from chart review and patient.  He was started on IV antibiotics with attempts at limb salvage.  Patient with unfavorable anatomy for revascularization attempts and BKA recommended due to concerns of wound healing.  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  Postop reported visual changes and CT head done which was unremarkable for acute intracranial process.  He has had issues with hypotension and carvedilol was held briefly then resumed at lower dose.  Bowel program has been augmented to manage constipation.  Hospital course further complicated by acute on chronic anemia managed with increasing Aranesp dose--unable to tolerate IV iron due to allergy.  Therapy evaluations completed revealing DOE as well as tachycardia due to debility as well as limitations due to left BKA.  CIR was recommended due to functional decline.  Please see preadmission assessment from earlier today as well.   Review of Systems  Constitutional: Negative for chills and fever.  HENT: Negative for hearing loss and tinnitus.   Eyes: Negative for blurred vision and double vision.  Respiratory: Negative for cough and shortness of breath.   Cardiovascular: Negative for chest pain and palpitations.  Gastrointestinal: Negative for heartburn, nausea and vomiting.  Musculoskeletal: Positive for myalgias. Negative for back pain.  Skin: Negative for rash.  Neurological: Negative for dizziness, speech change, focal weakness, weakness and headaches.  Psychiatric/Behavioral: The patient has insomnia.   All other systems reviewed and are negative.    Past Medical History:   Diagnosis Date  . Anxiety   . Arthritis   . Asthma    "as a child"  . CAD (coronary artery disease)   . Chronic systolic heart failure ([place]) 09/21/2008   ECHO Feb 2013 showed LVEF low normal at 50-55%, +hypokinetic anterolateral wall and inferolateral wall.    . CKD (chronic kidney disease) stage 4, GFR 15-29 ml/min (HCC) 08/30/2009   Progressive renal failure since 2008, creatinine 1.2 in 2008 up to 3.5 in 2012 and 3.2-5.0 in 2013. All UA's 2011-13 showed >300 protein on dipstick. Work-up in May 2011 showed negative Urine IFE and SPEP, ultrasound showed 12-13 cm kidneys with increased echogenicity and UPC ratio was 1.5 gm proteinuria.  Hgb A1C's from 2011 to 2013 were all between 9-11.  Patient saw Dr. Donnetta Hutching (vasc surgery) for HD access in Aug 2013 > vein mapping was done and Dr. Donnetta Hutching felt the left arm (pt is R handed) was suitable for L arm Cimino radiocephalic fistula. Patient said he wasn't ready to consider doing dialysis and declined the surgery.     . Depression   . Diabetic retinopathy ([place])   . ESRD (end stage renal disease) on dialysis ([place])   . FAOZHYQM(578.4)    "maybe monthly" (03/15/2014)  . Hyperlipidemia   . Hypertension   . Macular edema   . Myocardial infarction [facility])    status post MI x2 and 3 stents placed in 2003  . Obesity   . Sleep apnea    does not use CPAP  . Stroke [facility]) ~ 2007; ~1987   "weak on right side; messed w/right side of brain; cry all the time"  . Type  II diabetes mellitus ([place])    insulin dependent    Past Surgical History:  Procedure Laterality Date  . ABDOMINAL AORTOGRAM W/LOWER EXTREMITY N/A 04/29/2020   Procedure: ABDOMINAL AORTOGRAM W/LOWER EXTREMITY;  Surgeon: Angelia Mould, MD;  Location: [place] CV LAB;  Service: Cardiovascular;  Laterality: N/A;  . AMPUTATION Left 05/02/2020   Procedure: AMPUTATION BELOW KNEE;  Surgeon: Rosetta Posner, MD;  Location: [place];  Service: Vascular;  Laterality: Left;  . AV FISTULA PLACEMENT   10/17/2012   Procedure: ARTERIOVENOUS (AV) FISTULA CREATION;  Surgeon: Mal Misty, MD;  Location: [place];  Service: Vascular;  Laterality: Left;  . BONE EXOSTOSIS EXCISION Right 06/04/2019   Procedure: EXOSTOSIS EXCISION;  Surgeon: Leanora Cover, MD;  Location: [place];  Service: Orthopedics;  Laterality: Right;  . CAPD INSERTION N/A 03/15/2014   Procedure: LAPAROSCOPIC INSERTION CONTINUOUS AMBULATORY PERITONEAL DIALYSIS CATHETER, LAPARASCOPIC INCISIONAL HERNIA  REPAIR  WITH MESH, OMENTOPEXY AND LYSIS OF ADHESIONS;  Surgeon: [name], MD;  Location: [place];  Service: General;  Laterality: N/A;  . CORONARY ANGIOPLASTY WITH STENT PLACEMENT  ~ 2002   "3"  . CORONARY ANGIOPLASTY WITH STENT PLACEMENT  01/24/2004   successful PCI/stenting RCA  drug eluting cypher stent  . CORONARY ANGIOPLASTY WITH STENT PLACEMENT  01/28/2004   successful stentin of a large bifurcation marginal branch of the ramus intermediate vessel  . FLEXIBLE SIGMOIDOSCOPY N/A 09/05/2014   Procedure: FLEXIBLE SIGMOIDOSCOPY;  Surgeon: Cleotis Nipper, MD;  Location: [facility] ENDOSCOPY;  Service: Endoscopy;  Laterality: N/A;  . HERNIA REPAIR    . INCISION AND DRAINAGE ABSCESS N/A 09/06/2014   Procedure: REMOVAL OF PD CATH;  Surgeon: Coralie Keens, MD;  Location: [place];  Service: General;  Laterality: N/A;  . INCISIONAL HERNIA REPAIR  03/15/2014  . LAPAROSCOPIC LYSIS OF ADHESIONS  03/15/2014  . NM MYOCAR PERF WALL MOTION  02/06/2012   normal perfusion scan  . PERITONEAL CATHETER INSERTION  03/15/2014  . REFRACTIVE SURGERY Bilateral   . SHUNTOGRAM N/A 03/11/2013   Procedure: Fistulogram;  Surgeon: Conrad Johnavon, MD;  Location: [facility] CATH LAB;  Service: Cardiovascular;  Laterality: N/A;  . TENDON TRANSFER Right 06/04/2019   Procedure: RIGHT HAND EXTENSOR TENDON TRANSFER TO SMALL FINGER;  Surgeon: Leanora Cover, MD;  Location: [place];  Service: Orthopedics;  Laterality: Right;  . UMBILICAL HERNIA REPAIR       Family History  Problem Relation Age of Onset  . Asthma Mother   . Hyperlipidemia Mother   . Hypertension Mother   . Stroke Father   . Heart attack Father   . Prostate cancer Father   . Deep vein thrombosis Father   . Cancer Father   . Diabetes Father   . Hyperlipidemia Father   . Hypertension Father   . Other Father        varicose veins  . Heart disease Father        before age 24  . Other Sister        varicose veins    Social History:  Retired. Was in the [employer] then did multiple other jobs. he reports that he quit smoking about 33 years ago. His smoking use included cigarettes. He has a 15.00 pack-year smoking history. He has never used smokeless tobacco. He reports previous alcohol use. He reports that he does not use drugs.    Allergies  Allergen Reactions  . Ferumoxytol Itching       . Iron  Itching        . Iodinated Diagnostic Agents Itching    Medications Prior to Admission  Medication Sig Dispense Refill  . acetaminophen (TYLENOL) 325 MG tablet Take 650 mg by mouth every 6 (six) hours as needed for pain.    Marland Kitchen albuterol (PROVENTIL HFA;VENTOLIN HFA) 108 (90 BASE) MCG/ACT inhaler Inhale 2 puffs into the lungs every 4 (four) hours as needed for wheezing. For wheezing    . atorvastatin (LIPITOR) 40 MG tablet Take 40 mg by mouth at bedtime.    . B Complex-C-Folic Acid (DIALYVITE 637) 0.8 MG TABS Take 1 tablet by mouth daily.    . carvedilol (COREG) 12.5 MG tablet Take 6.25 mg by mouth 3 (three) times a week.     . diphenhydrAMINE (BENADRYL) 25 mg capsule Take 1 tablet by mouth every 6 (six) hours as needed for allergies.    . ferrous gluconate (FERGON) 324 MG tablet Take 324 mg by mouth daily.    . fluticasone (FLONASE) 50 MCG/ACT nasal spray Place 2 sprays into both nostrils daily.    . insulin aspart (NOVOLOG) 100 UNIT/ML FlexPen Inject 5 Units into the skin 2 (two) times daily between meals as needed for high blood sugar. Sliding Scale, if over 858, one  click per every 10    . lidocaine (XYLOCAINE) 5 % ointment Apply 1 application topically 3 (three) times daily as needed for mild pain or moderate pain.     . Methoxy PEG-Epoetin Beta (MIRCERA IJ) Inject 30 mcg as directed.    . mupirocin ointment (BACTROBAN) 2 % Apply 1 application topically daily.    . Omega-3 Fatty Acids (FISH OIL) 1000 MG CAPS Take 1 capsule by mouth daily.    Marland Kitchen oxycodone (OXY-IR) 5 MG capsule Take 5 mg by mouth every other day.    . polyethylene glycol (MIRALAX / GLYCOLAX) packet Take 17 g by mouth daily. 14 each 0  . sevelamer carbonate (RENVELA) 2.4 g PACK Take 2.4-4.8 g by mouth See admin instructions. Take 4.8 g (two packets) by mouth three times a day with meals and take 2.4 g (one packet) with snacks    . tiotropium (SPIRIVA) 18 MCG inhalation capsule Place 18 mcg into inhaler and inhale daily.    Marland Kitchen tiZANidine (ZANAFLEX) 4 MG capsule Take 2 mg by mouth every evening.    . [DISCONTINUED] insulin detemir (LEVEMIR) 100 UNIT/ML injection Inject 75 Units into the skin 2 (two) times daily.    Marland Kitchen glucose blood test strip 1 each by Other route as needed for other. Use as instructed    . Skin Protectants, Misc. (EUCERIN) cream Apply 1 application topically as needed for dry skin.      Drug Regimen Review  Drug regimen was reviewed and remains appropriate with no significant issues identified  Home: Home Living Family/patient expects to be discharged to:: Skilled nursing facility Living Arrangements: Children Available Help at Discharge: Family, Available PRN/intermittently Type of Home: Apartment Home Access: Level entry Home Layout: One level Bathroom Shower/Tub: Multimedia programmer: Handicapped height Bathroom Accessibility: Yes Home Equipment: Grab bars - toilet, Grab bars - tub/shower, Hand held shower head   Functional History: Prior Function Level of Independence: Independent Comments: as L foot pain increased more reliant on SPC for short distance  ambulation, independent in ADLs/iADLs  Functional Status:  Mobility: Bed Mobility Overal bed mobility: Needs Assistance Bed Mobility: Supine to Sit Supine to sit: HOB elevated, Min guard General bed mobility comments: minguard for safety,  pt required increased time and effort with use of bed rails, increased DoE with bed mobility Transfers Overall transfer level: Needs assistance Equipment used: Rolling walker (2 wheeled), 1 person hand held assist Transfer via Lift Equipment: Stedy Transfers: Lateral/Scoot Transfers Sit to Stand: Max assist Stand pivot transfers: Mod assist  Lateral/Scoot Transfers: Min guard General transfer comment: pt with L UE on chair and R UE on stedy. pt unable to release the chair on first attempt and returning to sitting on chair surface. pt anxiousa with transfer. pt able to attempt and transfer hand on additional attempts. pt needs step by step instructions.       ADL: ADL Overall ADL's : Needs assistance/impaired Eating/Feeding: Independent Grooming: Wash/dry face, Set up, Sitting, Oral care Grooming Details (indicate cue type and reason): in recliner Lower Body Dressing: Moderate assistance, Sitting/lateral leans Toilet Transfer: Magazine features editor Details (indicate cue type and reason): for lateral scoot transfer to drop arm recliner Functional mobility during ADLs: Supervision/safety, Rolling walker General ADL Comments: pt required minguard for lateral scoot transfer toward R side to drop arm recliner, HR up to 123 with mobility  Cognition: Cognition Overall Cognitive Status: Within Functional Limits for tasks assessed Orientation Level: Oriented X4 Cognition Arousal/Alertness: Awake/alert Behavior During Therapy: WFL for tasks assessed/performed Overall Cognitive Status: Within Functional Limits for tasks assessed   Blood pressure 111/83, pulse 91, temperature 98.2 F (36.8 C), temperature source Oral, resp. rate 19, height 5\' 11"   (1.803 m), weight 121.2 kg, SpO2 97 %. Physical Exam Vitals and nursing note reviewed.  Constitutional:      Appearance: He is obese.  HENT:     Head: Normocephalic and atraumatic.     Right Ear: External ear normal.     Left Ear: External ear normal.     Nose: Nose normal.  Eyes:     General:        Right eye: No discharge.        Left eye: No discharge.     Extraocular Movements: Extraocular movements intact.  Cardiovascular:     Rate and Rhythm: Normal rate and regular rhythm.  Pulmonary:     Effort: Pulmonary effort is normal. No respiratory distress.     Breath sounds: Normal breath sounds. No stridor.  Abdominal:     General: Abdomen is flat. Bowel sounds are normal.     Tenderness: There is no abdominal tenderness.  Musculoskeletal:     Cervical back: Normal range of motion and neck supple.     Comments: Left BKA with edema and tenderness  Skin:    Comments: Left BKA with dressing CDI  Neurological:     Mental Status: He is alert and oriented to person, place, and time.     Comments: Alert Motor: Bilateral upper extremities: 5/5 proximal nose Left lower extremity: 4+/5 proximal distal Right lower extremity: Hip flexion 4/5 (pain inhibition) Limited extension of the right knee Sensation intact to light touch  Psychiatric:        Mood and Affect: Mood normal.        Behavior: Behavior normal.     Results for orders placed or performed during the hospital encounter of 04/27/20 (from the past 48 hour(s))  Glucose, capillary     Status: Abnormal   Collection Time: 05/04/20  4:12 PM  Result Value Ref Range   Glucose-Capillary 117 (H) 70 - 99 mg/dL    Comment: Glucose reference range applies only to samples taken after fasting for at least  8 hours.  Glucose, capillary     Status: Abnormal   Collection Time: 05/04/20  9:33 PM  Result Value Ref Range   Glucose-Capillary 129 (H) 70 - 99 mg/dL    Comment: Glucose reference range applies only to samples taken after  fasting for at least 8 hours.  Glucose, capillary     Status: Abnormal   Collection Time: 05/04/20 10:15 PM  Result Value Ref Range   Glucose-Capillary 129 (H) 70 - 99 mg/dL    Comment: Glucose reference range applies only to samples taken after fasting for at least 8 hours.  Comprehensive metabolic panel     Status: Abnormal   Collection Time: 05/05/20  3:38 AM  Result Value Ref Range   Sodium 133 (L) 135 - 145 mmol/L   Potassium 4.6 3.5 - 5.1 mmol/L   Chloride 95 (L) 98 - 111 mmol/L   CO2 23 22 - 32 mmol/L   Glucose, Bld 112 (H) 70 - 99 mg/dL    Comment: Glucose reference range applies only to samples taken after fasting for at least 8 hours.   BUN 49 (H) 8 - 23 mg/dL   Creatinine, Ser 10.30 (H) 0.61 - 1.24 mg/dL   Calcium 8.2 (L) 8.9 - 10.3 mg/dL   Total Protein 7.4 6.5 - 8.1 g/dL   Albumin 2.1 (L) 3.5 - 5.0 g/dL   AST 41 15 - 41 U/L   ALT 17 0 - 44 U/L   Alkaline Phosphatase 75 38 - 126 U/L   Total Bilirubin 0.4 0.3 - 1.2 mg/dL   GFR calc non Af Amer 5 (L) >60 mL/min   GFR calc Af Amer 6 (L) >60 mL/min   Anion gap 15 5 - 15    Comment: Performed at [facility] [address] [place], [place] [address]  Glucose, capillary     Status: Abnormal   Collection Time: 05/05/20  8:05 AM  Result Value Ref Range   Glucose-Capillary 110 (H) 70 - 99 mg/dL    Comment: Glucose reference range applies only to samples taken after fasting for at least 8 hours.  Glucose, capillary     Status: None   Collection Time: 05/05/20  4:41 PM  Result Value Ref Range   Glucose-Capillary 81 70 - 99 mg/dL    Comment: Glucose reference range applies only to samples taken after fasting for at least 8 hours.  Glucose, capillary     Status: Abnormal   Collection Time: 05/05/20 10:12 PM  Result Value Ref Range   Glucose-Capillary 112 (H) 70 - 99 mg/dL    Comment: Glucose reference range applies only to samples taken after fasting for at least 8 hours.  Comprehensive metabolic panel     Status:  Abnormal   Collection Time: 05/06/20 12:50 AM  Result Value Ref Range   Sodium 134 (L) 135 - 145 mmol/L   Potassium 4.1 3.5 - 5.1 mmol/L   Chloride 94 (L) 98 - 111 mmol/L   CO2 27 22 - 32 mmol/L   Glucose, Bld 97 70 - 99 mg/dL    Comment: Glucose reference range applies only to samples taken after fasting for at least 8 hours.   BUN 25 (H) 8 - 23 mg/dL   Creatinine, Ser 7.19 (H) 0.61 - 1.24 mg/dL   Calcium 8.7 (L) 8.9 - 10.3 mg/dL   Total Protein 7.4 6.5 - 8.1 g/dL   Albumin 2.2 (L) 3.5 - 5.0 g/dL   AST 42 (H) 15 -  41 U/L   ALT 18 0 - 44 U/L   Alkaline Phosphatase 80 38 - 126 U/L   Total Bilirubin 0.6 0.3 - 1.2 mg/dL   GFR calc non Af Amer 7 (L) >60 mL/min   GFR calc Af Amer 9 (L) >60 mL/min   Anion gap 13 5 - 15    Comment: Performed at [facility] [address] [place], [place] [address]  Glucose, capillary     Status: None   Collection Time: 05/06/20  8:28 AM  Result Value Ref Range   Glucose-Capillary 81 70 - 99 mg/dL    Comment: Glucose reference range applies only to samples taken after fasting for at least 8 hours.  Glucose, capillary     Status: Abnormal   Collection Time: 05/06/20 10:58 AM  Result Value Ref Range   Glucose-Capillary 115 (H) 70 - 99 mg/dL    Comment: Glucose reference range applies only to samples taken after fasting for at least 8 hours.   No results found.     Medical Problem List and Plan: 1.  Limitations with mobility, transfers, endurance, self-care secondary to left BKA.  -patient may not shower  -ELOS/Goals: 12-16 days/supervision/min a  Admit to CIR 2.  Antithrombotics: -DVT/anticoagulation:  Pharmaceutical: Heparin  -antiplatelet therapy: ASA daily  3. Pain Management: Oxycodone as needed --DC IV Dilaudid (was using once a day on average)    Monitor with increased exertion. 4. Mood: LCSW to   -antipsychotic agents: N/a 5. Neuropsych: This patient is  capable of making decisions on his own behalf. 6. Skin/Wound Care: Daily  dressing changes. Monitor wound for healing.  Vitamin C for healing.  Prosource added to promote wound healing.  7. Fluids/Electrolytes/Nutrition: Monitor I/O.  Modified/renal diet with 1200 ccFR/day. Labs monitored with HD. 8.  T2DM with hyperglycemia:  Hgb A1c- 7.9.  Continue Lantus 20 units daily (On levemir 75 mg bid/Novolog 5 untis bid PTA- reports eats better at home).  Continue to monitor BS ac/hs and use SSI for elevated blood sugars.  Monitor with increased mobility 9.  ESRD: Schedule hemodialysis at the end of the day on TTS to help with tolerance of activity.  Metabolic bone disease managed with Hectorol and Renvela. 10.  COPD: Respiratory status stable on Incruse daily 11.  Anemia of chronic disease: Serial CBCs with hemodialysis.  On IV Aranesp weekly with [place] twice daily  CBC ordered. 12.  History of CAD: Carvedilol decreased due to hypotension --continue Coreg 3.125 mg bid with Lipitor and ASA.  Monitor with increased mobility.   Bary Leriche, PA-C 05/06/2020  I have personally performed a face to face diagnostic evaluation, including, but not limited to relevant history and physical exam findings, of this patient and developed relevant assessment and plan.  Additionally, I have reviewed and concur with the physician assistant's documentation above.  Delice Lesch, MD, ABPMR

## 2020-05-06 NOTE — TOC Transition Note (Signed)
Transition of Care [facility]) - CM/SW Discharge Note   Patient Details  Name: Jonathon Bailey MRN: [id number] Date of Birth: 11/28/1955  Transition of Care [facility]) CM/SW Contact:  Bethena Roys, RN Phone Number: 05/06/2020, 12:07 PM   Clinical Narrative: Patient has been approved for inpatient rehab. Inpatient rehab admissions coordinator will assist with transition needs. Staff RN aware of plan of care. No further needs from transitions of care team.   Final next level of care: IP Rehab Facility Barriers to Discharge: No Barriers Identified   Patient Goals and CMS Choice Patient states their goals for this hospitalization and ongoing recovery are:: to go to inpatient rehab. CMS Medicare.gov Compare Post Acute Care list provided to:: Patient Choice offered to / list presented to : Patient   Readmission Risk Interventions Readmission Risk Prevention Plan 05/06/2020  Transportation Screening Complete  PCP or Specialist Appt within 3-5 Days Complete  HRI or Home Care Consult Complete  Social Work Consult for [place] Planning/Counseling Complete  Palliative Care Screening Not Applicable  Medication Review Press photographer) Complete  Some recent data might be hidden

## 2020-05-06 NOTE — PMR Pre-admission (Signed)
PMR Admission Coordinator Pre-Admission Assessment  Patient: Jonathon Bailey is an 64 y.o., male MRN: [id number] DOB: 1956-08-01 Height: 5' 11" (180.3 cm) Weight: 121.2 kg  Insurance Information HMO:     PPO:      PCP:      IPA:      80/20:      OTHER:  PRIMARY: Medicare A and B      Policy#: [id number]      Subscriber: pt CM Name:       Phone#:      Fax#:  Pre-Cert#: verified Civil engineer, contracting:  Benefits:  Phone #:      Name:  Eff. Date: A 08/24/2010, B 12/24/2010     Deduct: $1484      Out of Pocket Max: n/a      Life Max: n/a CIR: 100%      SNF: 20 full days Outpatient: 80%     Co-Pay: 20% Home Health: 100%      Co-Pay:  DME: 80%     Co-Pay: 20% Providers: pt choice  SECONDARY: [place]      Policy#: [id number]     Phone#:   Financial Counselor:       Phone#:   The "Data Collection Information Summary" for patients in Inpatient Rehabilitation Facilities with attached "Privacy Act [place] Records" was provided and verbally reviewed with: Patient  Emergency Contact Information Contact Information    Name Relation Home Work [place], Ewing. Waunita Schooner [phone]  [phone]      Current Medical History  Patient Admitting Diagnosis: L BKA   History of Present Illness: Pt is a 64 y/o male with PMH Of CAD (s/p CABG), ESRD on HD TRS, DM, and cerebrovascular disease admitted on 04/27/2020 to [facility] with worsening L foot pain.  Found to have wet gangrene with sepsis. VVS consulted and pt underwent ateriography of LLE on 8/6 per Dr. Scot Dock.  Pt continued to have progressive changes in LLE and was recommended for a BKA which he underwent with Dr. Donnetta Hutching on 8/9.  Post op course pain management.  PMR was consulted by VVS and pt was evaluated for CIR program.   Patient's medical record from [facility] has been reviewed by the rehabilitation admission coordinator and physician.  Past Medical History  Past Medical History:  Diagnosis Date  .  Anxiety   . Arthritis   . Asthma    "as a child"  . CAD (coronary artery disease)   . Chronic systolic heart failure ([place]) 09/21/2008   ECHO Feb 2013 showed LVEF low normal at 50-55%, +hypokinetic anterolateral wall and inferolateral wall.    . CKD (chronic kidney disease) stage 4, GFR 15-29 ml/min (HCC) 08/30/2009   Progressive renal failure since 2008, creatinine 1.2 in 2008 up to 3.5 in 2012 and 3.2-5.0 in 2013. All UA's 2011-13 showed >300 protein on dipstick. Work-up in May 2011 showed negative Urine IFE and SPEP, ultrasound showed 12-13 cm kidneys with increased echogenicity and UPC ratio was 1.5 gm proteinuria.  Hgb A1C's from 2011 to 2013 were all between 9-11.  Patient saw Dr. Donnetta Hutching (vasc surgery) for HD access in Aug 2013 > vein mapping was done and Dr. Donnetta Hutching felt the left arm (pt is R handed) was suitable for L arm Cimino radiocephalic fistula. Patient said he wasn't ready to consider doing dialysis and declined the surgery.     . Depression   . Diabetic retinopathy ([place])   .  ESRD (end stage renal disease) on dialysis ([place])   . QZESPQZR(007.6)    "maybe monthly" (03/15/2014)  . Hyperlipidemia   . Hypertension   . Macular edema   . Myocardial infarction [facility])    status post MI x2 and 3 stents placed in 2003  . Obesity   . Sleep apnea    does not use CPAP  . Stroke [facility]) ~ 2007; ~1987   "weak on right side; messed w/right side of brain; cry all the time"  . Type II diabetes mellitus (HCC)    insulin dependent    Family History   family history includes Asthma in his mother; Cancer in his father; Deep vein thrombosis in his father; Diabetes in his father; Heart attack in his father; Heart disease in his father; Hyperlipidemia in his father and mother; Hypertension in his father and mother; Other in his father and sister; Prostate cancer in his father; Stroke in his father.  Prior Rehab/Hospitalizations Has the patient had prior rehab or hospitalizations prior to admission?  Yes  Has the patient had major surgery during 100 days prior to admission? Yes   Current Medications  Current Facility-Administered Medications:  .  0.9 %  sodium chloride infusion, , Intravenous, Continuous, Pervis Hocking, DO, Last Rate: 10 mL/hr at 05/02/20 0945, Restarted at 05/02/20 1124 .  acetaminophen (TYLENOL) tablet 325-650 mg, 325-650 mg, Oral, Q4H PRN **OR** acetaminophen (TYLENOL) suppository 325-650 mg, 325-650 mg, Rectal, Q4H PRN, Early, Arvilla Meres, MD .  ascorbic acid (VITAMIN C) tablet 1,000 mg, 1,000 mg, Oral, BID, Roney Jaffe, MD, 1,000 mg at 05/06/20 0940 .  aspirin chewable tablet 81 mg, 81 mg, Oral, QODAY, Simmons-Robinson, Makiera, MD, 81 mg at 05/05/20 0953 .  atorvastatin (LIPITOR) tablet 40 mg, 40 mg, Oral, QHS, Angelia Mould, MD, 40 mg at 05/05/20 2229 .  bisacodyl (DULCOLAX) EC tablet 10 mg, 10 mg, Oral, Daily PRN, Roney Jaffe, MD .  carvedilol (COREG) tablet 3.125 mg, 3.125 mg, Oral, BID WC, Simmons-Robinson, Makiera, MD, 3.125 mg at 05/06/20 0940 .  cetaphil lotion, , Topical, Daily, Simmons-Robinson, Makiera, MD, Given at 05/06/20 [phone] .  Chlorhexidine Gluconate Cloth 2 % PADS 6 each, 6 each, Topical, Q0600, Loren Racer, PA-C, 6 each at 05/05/20 0615 .  cinacalcet (SENSIPAR) tablet 90 mg, 90 mg, Oral, Q supper, Angelia Mould, MD, 90 mg at 05/04/20 1725 .  Darbepoetin Alfa (ARANESP) injection 100 mcg, 100 mcg, Intravenous, Q Thu-HD, Loren Racer, PA-C, 100 mcg at 05/05/20 1157 .  doxercalciferol (HECTOROL) injection 6 mcg, 6 mcg, Intravenous, Q T,Th,Sa-HD, Angelia Mould, MD, 6 mcg at 05/05/20 1158 .  ferrous gluconate (FERGON) tablet 324 mg, 324 mg, Oral, BID WC, Loren Racer, PA-C, 324 mg at 05/06/20 0941 .  heparin injection 5,000 Units, 5,000 Units, Subcutaneous, Q8H, Martyn Malay, MD, 5,000 Units at 05/06/20 0500 .  HYDROmorphone (DILAUDID) injection 1 mg, 1 mg, Intravenous, Q3H PRN, Rhyne, Samantha J,  PA-C, 1 mg at 05/06/20 0200 .  insulin aspart (novoLOG) injection 0-15 Units, 0-15 Units, Subcutaneous, TID WC, Daisy Floro, DO, 2 Units at 05/04/20 1228 .  insulin glargine (LANTUS) injection 20 Units, 20 Units, Subcutaneous, QHS, Daisy Floro, DO, 20 Units at 05/05/20 2230 .  melatonin tablet 5 mg, 5 mg, Oral, QHS, McQuilla, Jai B, MD, 5 mg at 05/05/20 2229 .  menthol-cetylpyridinium (CEPACOL) lozenge 3 mg, 1 lozenge, Oral, Daily, Simmons-Robinson, Makiera, MD, 3 mg at 05/06/20 0943 .  multivitamin (RENA-VIT)  tablet 1 tablet, 1 tablet, Oral, QHS, Angelia Mould, MD, 1 tablet at 05/05/20 2230 .  oxyCODONE (Oxy IR/ROXICODONE) immediate release tablet 5-10 mg, 5-10 mg, Oral, Q4H PRN, Rhyne, Samantha J, PA-C, 5 mg at 05/06/20 0836 .  pantoprazole (PROTONIX) EC tablet 40 mg, 40 mg, Oral, Daily, Early, Arvilla Meres, MD, 40 mg at 05/06/20 0940 .  polyethylene glycol (MIRALAX / GLYCOLAX) packet 17 g, 17 g, Oral, Daily, Angelia Mould, MD, 17 g at 05/06/20 0940 .  senna (SENOKOT) tablet 8.6 mg, 1 tablet, Oral, Daily, Martyn Malay, MD, 8.6 mg at 05/06/20 0940 .  sevelamer carbonate (RENVELA) powder PACK 4.8 g, 4.8 g, Oral, TID WC, Angelia Mould, MD, 4.8 g at 05/06/20 0941 .  tobramycin (TOBREX) 0.3 % ophthalmic solution 1 drop, 1 drop, Both Eyes, Q6H PRN, Angelia Mould, MD .  umeclidinium bromide (INCRUSE ELLIPTA) 62.5 MCG/INH 1 puff, 1 puff, Inhalation, Daily, Angelia Mould, MD, 1 puff at 05/06/20 0940  Patients Current Diet:  Diet Order            Diet renal/carb modified with fluid restriction Diet-HS Snack? Nothing; Fluid restriction: 1200 mL Fluid; Room service appropriate? Yes; Fluid consistency: Thin  Diet effective now                 Precautions / Restrictions Precautions Precautions: Fall Precaution Comments: recent L BKA Restrictions Weight Bearing Restrictions: Yes LLE Weight Bearing: Non weight bearing   Has the patient had 2  or more falls or a fall with injury in the past year? Yes  Prior Activity Level Limited Community (1-2x/wk): independent with ADLs/mobility, but more recently was requiring SPC and limited in activity 2/2 pain  Prior Functional Level Self Care: Did the patient need help bathing, dressing, using the toilet or eating? Independent  Indoor Mobility: Did the patient need assistance with walking from room to room (with or without device)? Independent  Stairs: Did the patient need assistance with internal or external stairs (with or without device)? Independent  Functional Cognition: Did the patient need help planning regular tasks such as shopping or remembering to take medications? Independent  Home Assistive Devices / Equipment Home Assistive Devices/Equipment: CBG Meter, Blood pressure cuff Home Equipment: Grab bars - toilet, Grab bars - tub/shower, Hand held shower head  Prior Device Use: Indicate devices/aids used by the patient prior to current illness, exacerbation or injury? SPC most immediately PTA  Current Functional Level Cognition  Overall Cognitive Status: Within Functional Limits for tasks assessed Orientation Level: Oriented X4    Extremity Assessment (includes Sensation/Coordination)  Upper Extremity Assessment: Generalized weakness  Lower Extremity Assessment: LLE deficits/detail LLE Deficits / Details: new BKA, decreased knee flexion/extension, limited hip flexion;educated pt on importance of mobilizing LLE, keeping knee in extension in preparation for prosthetic LLE: Unable to fully assess due to pain    ADLs  Overall ADL's : Needs assistance/impaired Eating/Feeding: Independent Grooming: Wash/dry face, Set up, Sitting, Oral care Grooming Details (indicate cue type and reason): in recliner Lower Body Dressing: Moderate assistance, Sitting/lateral leans Toilet Transfer: Magazine features editor Details (indicate cue type and reason): for lateral scoot transfer to  drop arm recliner Functional mobility during ADLs: Supervision/safety, Rolling walker General ADL Comments: pt required minguard for lateral scoot transfer toward R side to drop arm recliner, HR up to 123 with mobility    Mobility  Overal bed mobility: Needs Assistance Bed Mobility: Supine to Sit Supine to sit: HOB elevated, Min guard General  bed mobility comments: minguard for safety, pt required increased time and effort with use of bed rails, increased DoE with bed mobility    Transfers  Overall transfer level: Needs assistance Equipment used: Rolling walker (2 wheeled), 1 person hand held assist Transfer via Wilder: Stedy Transfers: Lateral/Scoot Transfers Sit to Stand: Max assist Stand pivot transfers: Mod assist  Lateral/Scoot Transfers: Min guard General transfer comment: pt with L UE on chair and R UE on stedy. pt unable to release the chair on first attempt and returning to sitting on chair surface. pt anxiousa with transfer. pt able to attempt and transfer hand on additional attempts. pt needs step by step instructions.     Ambulation / Gait / Stairs / Office manager / Balance Balance Overall balance assessment: Needs assistance Sitting-balance support: Bilateral upper extremity supported, Feet supported Sitting balance-Leahy Scale: Fair Standing balance support: Bilateral upper extremity supported, During functional activity Standing balance-Leahy Scale: Poor Standing balance comment: deferred    Special needs/care consideration Dialysis: Hemodialysis Tuesday, Thursday and Saturday, Skin incision to LLE and Diabetic management yes   Previous Home Environment (from acute therapy documentation) Living Arrangements: Children Available Help at Discharge: Family, Available PRN/intermittently Type of Home: Apartment Home Layout: One level Home Access: Level entry Bathroom Shower/Tub: Multimedia programmer: Handicapped height Bathroom  Accessibility: Yes Home Care Services: No  Discharge Living Setting Plans for Discharge Living Setting: Patient's home Type of Home at Discharge: Apartment Discharge Home Layout: One level Discharge Home Access: Level entry Discharge Bathroom Shower/Tub: Walk-in shower Discharge Bathroom Toilet: Standard Discharge Bathroom Accessibility: Yes How Accessible: Accessible via wheelchair Does the patient have any problems obtaining your medications?: No  Social/Family/Support Systems Patient Roles: Parent Contact Information: son, Milt Coye is a Ship broker at [employer] studying anesthesiology Anticipated Caregiver: Dorris Carnes Anticipated Caregiver's Contact Information: [phone] Ability/Limitations of Caregiver: he is a Ship broker at [employer], will not be able to provide assist when in class/band practice Caregiver Availability: Evenings only Discharge Plan Discussed with Primary Caregiver: Yes (patient/mod I goals) Is Caregiver In Agreement with Plan?: Yes Does Caregiver/Family have Issues with Lodging/Transportation while Pt is in Rehab?: No  Goals Patient/Family Goal for Rehab: PT/OT supervision to mod I Expected length of stay: 14-16 days Pt/Family Agrees to Admission and willing to participate: Yes Program Orientation Provided & Reviewed with Pt/Caregiver Including Roles  & Responsibilities: Yes  Barriers to Discharge: Decreased caregiver support  Barriers to Discharge Comments: pt with medicare A and B and VA benefits  Decrease burden of Care through IP rehab admission: n/a  Possible need for SNF placement upon discharge: Not anticipated, but possible.   Patient Condition: I have reviewed medical records from [facility], spoken with CM, and patient. I met with patient at the bedside for inpatient rehabilitation assessment.  Patient will benefit from ongoing PT and OT, can actively participate in 3 hours of therapy a day 5 days of the week, and can make measurable gains  during the admission.  Patient will also benefit from the coordinated team approach during an Inpatient Acute Rehabilitation admission.  The patient will receive intensive therapy as well as Rehabilitation physician, nursing, social worker, and care management interventions.  Due to safety, skin/wound care, disease management, medication administration, pain management and patient education the patient requires 24 hour a day rehabilitation nursing.  The patient is currently min to max with mobility and basic ADLs.  Discharge setting and therapy post discharge at  home is anticipated.  Patient has agreed to participate in the Acute Inpatient Rehabilitation Program and will admit today.  Preadmission Screen Completed By:  Michel Santee, PT, DPT 05/06/2020 11:33 AM ______________________________________________________________________   Discussed status with Dr. Posey Pronto on 05/06/20  at 12:08 PM  and received approval for admission today.  Admission Coordinator:  Michel Santee, PT, DPT time 12:08 PM Sudie Grumbling 05/06/20    Assessment/Plan: Diagnosis: L BKA  1. Does the need for close, 24 hr/day Medical supervision in concert with the patient's rehab needs make it unreasonable for this patient to be served in a less intensive setting? Potentially  2. 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 3. Due to bowel management, safety, skin/wound care, disease management, pain management and patient education, does the patient require 24 hr/day rehab nursing? Potentially 4. Does the patient require coordinated care of a physician, rehab nurse, PT, OT to address physical and functional deficits in the context of the above medical diagnosis(es)? Potentially Addressing deficits in the following areas: balance, endurance, locomotion, strength, transferring, bathing, dressing, toileting and psychosocial support 5. Can the  patient actively participate in an intensive therapy program of at least 3 hrs of therapy 5 days a week? Yes 6. The potential for patient to make measurable gains while on inpatient rehab is excellent 7. Anticipated functional outcomes upon discharge from inpatient rehab: supervision and min assist PT, supervision and min assist OT, n/a SLP 8. Estimated rehab length of stay to reach the above functional goals is: 10-13 days. 9. Anticipated discharge destination: Home 10. Overall Rehab/Functional Prognosis: good   MD Signature: Delice Lesch, MD, ABPMR

## 2020-05-06 NOTE — Discharge Summary (Addendum)
[facility] Discharge Summary  Patient name: Jonathon Bailey Medical record number: [id number] Date of birth: 01-06-1956 Age: 64 y.o. Gender: male Date of Admission: 04/27/2020  Date of Discharge: 05/06/20  Admitting Physician: Jonathon Malay, MD  Primary Care Provider: [facility], [facility] Consultants: Vascular, Ortho   Indication for Hospitalization: Dry gangrene of L toe   Discharge Diagnoses/Problem List:  Principal Problem:   Sepsis without septic shock [facility]) Active Problems:   Diabetes mellitus type 2 with complications ([place])   Coronary atherosclerosis   ESRD (end stage renal disease) ([place])   Wet gangrene ([place])   S/P BKA (below knee amputation) unilateral, left [facility])   Disposition: Inpatient Rehab   Discharge Condition: Improved   Discharge Exam:   Objective: Temp:  [98.5 F (36.9 C)-99 F (37.2 C)] 98.5 F (36.9 C) (08/12 0356) Pulse Rate:  [78-91] 91 (08/12 0356) Resp:  [16-19] 18 (08/12 0700) BP: (93-147)/(52-91) 147/83 (08/12 0700) SpO2:  [94 %-100 %] 94 % (08/12 0356) Weight:  [409 kg] 124 kg (08/12 0609) Physical Exam: General: obese male, in no acute distress, very pleasant    Cardiovascular: RRR. No murmurs, rubs or gallops  Respiratory: CTA bilaterally. No wheezes, rales, or rhonchi  Abdomen: soft, non-distended, non-tender  Extremities: L BKA bandaged. RLE warm and dry with no edema. Distal pulse 2+   Brief Hospital Course:   Severe PAD with ischemic left foot   Left toe dry gangrene: [name] presented with gangrene of his left toes. He was put on antibiotics (IV Zosyn and IV vancomycin) for this infection. An xray of his toe showed no fracture or bone abnormality.   With concern for left limb ischemia, an abdominal aortogram and leg arteriogram were performed and showed severe bilateral PAD below the knees. Because of the inability to revascularize his left foot, a shared decision was made to perform L below the  knee amputation.  Patient had left below knee amputation on 8/9 and tolerated the procedure well. Antibiotics were discontinued 48 hours after the procedure. Pain was treated with tylenol, oxycodone, and dilaudid.   Patient received PT and OT while in the hospital and tolerated it well, they recommended either Skilled Nursing or [facility] inpatient rehabilitation upon discharge. Overall patient remained hemodynamically stable throughout his hospital stay.   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   ESRD:  Patient received dialysis sessions per his TThSat schedule. Nephrology was consulted and we continued to monitor his electrolytes   Insulin-dependent T2DM:  Patient was given Lantus 20 units at bedtime. Home Levemir was held while receiving Lantus.  Patient was on a Sensitive SSI and we monitored CBGs with meals and at bedtime. A diabetes coordinator was also consulted. Patient's blood sugars while in the hospital were adequately controlled.   Anemia of CKD We monitored hemoglobin, and suspected has acute blood loss anemia on top of anemia of CKD. Iron and aranesp were given per Nephrology    Issues for Follow Up:  Take the new lower dose of Coreg 3.125 twice daily, can hold his blood pressure less than 90/60. Continue the diabetes regimen of Lantus 20 units BID  Continue a renal and carb modified diet  Follow up  with PCP after inpatient rehab   Significant Procedures: Left below the knee amputation   Significant Labs and Imaging:  Recent Labs  Lab 05/02/20 0345 05/02/20 0345 05/02/20 1644 05/03/20 0411 05/04/20 0521  WBC 16.2*   --   --  13.1* 12.0*  HGB 8.5*   < > 7.9* 7.9* 8.1*  HCT 27.4*   < > 26.1* 25.6* 27.8*  PLT 365  --   --  342 386   < > = values in this interval not displayed.   Recent Labs  Lab 05/02/20 0345 05/02/20 0345 05/03/20 0411 05/03/20 0411 05/04/20 0521 05/04/20 0521 05/04/20 1001 05/05/20 0338 05/06/20 0050  NA 135  --  134*  --  132*  --   --  133* 134*  K 4.7   < > 6.1*   < > 4.6   < >  --  4.6 4.1  CL 95*  --  92*  --  93*  --   --  95* 94*  CO2 23  --  23  --  24  --   --  23 27  GLUCOSE 100*  --  226*  --  162*  --   --  112* 97  BUN 54*  --  70*  --  39*  --   --  49* 25*  CREATININE 10.15*  --  11.64*  --  8.26*  --   --  10.30* 7.19*  CALCIUM 8.3*  --  8.3*  --  8.1*  --   --  8.2* 8.7*  PHOS  --   --  7.1*  --   --   --  6.1*  --   --   ALKPHOS 70  --  66  --  74  --   --  75 80  AST 21  --  27  --  36  --   --  41 42*  ALT 27  --  25  --  21  --   --  17 18  ALBUMIN 2.2*  --  2.1*  --  2.1*  --   --  2.1* 2.2*   < > = values in this interval not displayed.    Results/Tests Pending at Time of Discharge: None   Discharge Medications:  Allergies as of 05/06/2020       Reactions   Ferumoxytol Itching      Iron Itching       Iodinated Diagnostic Agents Itching        Medication List     STOP taking these medications    diphenhydrAMINE 25 mg capsule Commonly known as: BENADRYL   eucerin cream   ferrous gluconate 324 MG tablet Commonly known as: FERGON   insulin aspart 100 UNIT/ML FlexPen Commonly known as: NOVOLOG   mupirocin ointment 2 % Commonly known as: BACTROBAN   oxycodone 5 MG capsule Commonly known as: OXY-IR   tiZANidine 4 MG capsule Commonly known as: ZANAFLEX       TAKE these medications    acetaminophen 325 MG tablet Commonly known as: TYLENOL Take 650 mg by mouth every 6 (six) hours as needed for pain.   albuterol 108 (90 Base) MCG/ACT inhaler Commonly known as: VENTOLIN HFA Inhale 2 puffs into the lungs every 4 (four)  hours as needed for wheezing. For wheezing   aspirin 81 MG chewable tablet Chew 1 tablet (81 mg total) by mouth every other day. Start taking on: May 07, 2020  atorvastatin 40 MG tablet Commonly known as: LIPITOR Take 40 mg by mouth at bedtime.   carvedilol 3.125 MG tablet Commonly known as: COREG Take 1 tablet (3.125 mg total) by mouth 2 (two) times daily with a meal. What changed:  medication strength how much to take when to take this   cinacalcet 30 MG tablet Commonly known as: SENSIPAR Take 3 tablets (90 mg total) by mouth daily with supper.   Dialyvite 800 0.8 MG Tabs Take 1 tablet by mouth daily.   Fish Oil 1000 MG Caps Take 1 capsule by mouth daily.   fluticasone 50 MCG/ACT nasal spray Commonly known as: FLONASE Place 2 sprays into both nostrils daily.   glucose blood test strip 1 each by Other route as needed for other. Use as instructed   HYDROmorphone 2 MG tablet Commonly known as: Dilaudid Take 0.5 tablets (1 mg total) by mouth every 6 (six) hours as needed for up to 5 days for severe pain.   insulin detemir 100 UNIT/ML injection Commonly known as: Levemir Inject 0.2 mLs (20 Units total) into the skin 2 (two) times daily. What changed: how much to take   lidocaine 5 % ointment Commonly known as: XYLOCAINE Apply 1 application topically 3 (three) times daily as needed for mild pain or moderate pain.   MIRCERA IJ Inject 30 mcg as directed.   pantoprazole 40 MG tablet Commonly known as: PROTONIX Take 1 tablet (40 mg total) by mouth daily. Start taking on: May 07, 2020   polyethylene glycol 17 g packet Commonly known as: MIRALAX / GLYCOLAX Take 17 g by mouth daily.   sevelamer carbonate 2.4 g Pack Commonly known as: RENVELA Take 2.4-4.8 g by mouth See admin instructions. Take 4.8 g (two packets) by mouth three times a day with meals and take 2.4 g (one packet) with snacks   tiotropium 18 MCG inhalation capsule Commonly known as:  SPIRIVA Place 18 mcg into inhaler and inhale daily.        Discharge Instructions: Please refer to Patient Instructions section of EMR for full details.  Patient was counseled important signs and symptoms that should prompt return to medical care, changes in medications, dietary instructions, activity restrictions, and follow up appointments.   Follow-Up Appointments: Patient to be transitioned directly to Melmore, Belle, DO 05/06/2020, 1:20 PM PGY-1, [place] [facility]-Level Resident Addendum   I have independently interviewed and examined the patient. I have discussed the above with the original author and agree with their documentation. My edits for correction/addition/clarification are in italics.  Please see also any attending notes.    Milus Banister, DO PGY-2, [facility] 05/06/2020 4:39 PM  [place] Service pager: [phone] (text pages welcome through [facility])

## 2020-05-06 NOTE — Progress Notes (Signed)
Patient ID: Jonathon Bailey, male   DOB: Jun 12, 1956, 64 y.o.   MRN: [id number] Left BKA dressing removed.  Skin edge healing very nicely with no evidence of breakdown.  Continued to have incisional pain.  Plan for transfer to CIR today.  I will see again Monday.  Please call my partners over the weekend for vascular concerns

## 2020-05-06 NOTE — Progress Notes (Signed)
Pt admitted to room [phone]. Oriented to floor, call bella and rehab fall policy. C/o pain on L BKA site. PRN oxycodone given. Otherwise no complaints. Continue plan of care.   Gerald Stabs, RN

## 2020-05-06 NOTE — Discharge Instructions (Signed)
Dear Jonathon Bailey,   Thank you so much for allowing [place] to be part of your care!  You were admitted to [facility] for an infection in the bone of your foot and underwent a surgery for amputation. You were treated with antibiotics and had dialysis per your normal schedule.   While here we adjusted your Coreg in order to avoid low blood pressures and bradycardia. You will take a lower dose 3.125mg  twice daily.    POST-HOSPITAL & CARE INSTRUCTIONS 1. ** 2. Please let PCP/Specialists know of any changes that were made.  3. Please see medications section of this packet for any medication changes.   DOCTOR'S APPOINTMENT & FOLLOW UP CARE INSTRUCTIONS  Please schedule follow up with your primary care physician at the [facility].   RETURN PRECAUTIONS: Please return to care if you experience shortness of breath, increasing pain at the incision site, redness or warmth at the incision site.   Take care and be well!  [facility]  [place], [place] [address] [phone]

## 2020-05-06 NOTE — Progress Notes (Signed)
Inpatient Diabetes Program Recommendations  AACE/ADA: New Consensus Statement on Inpatient Glycemic Control (2015)  Target Ranges:  Prepandial:   less than 140 mg/dL      Peak postprandial:   less than 180 mg/dL (1-2 hours)      Critically ill patients:  140 - 180 mg/dL   Lab Results  Component Value Date   GLUCAP 115 (H) 05/06/2020   HGBA1C 7.9 (H) 04/29/2020    Review of Glycemic Control  Diabetes history: type 2 Outpatient Diabetes medications: Levemir 75 units BID, Novolog 5 units BID Current orders for Inpatient glycemic control: Lantus 20 units at HS, Novolog MODERATE correction scale TID  Inpatient Diabetes Program Recommendations:   Spoke with patient at the bedside. States that he has had diabetes for a long time. States that the [place] missed his infection in his foot and had to have surgery to remove it. Has been on dialysis for 7 years. Noted that his HgbA1C is 7.9%, but not sure how accurate it is due to ESRD.   Patient states that he lives with his son. Will be going to inpatient rehab at discharge. Patient seemed somewhat sleepy, but was sitting up in chair and responded to my questions appropriately.  Patient's blood sugars in the hospital have been good. Remains on Lantus 20 units daily. Probably will need less Levemir than he has been taking at home after discharge.   Will continue to monitor blood sugars while in the hospital.  Harvel Ricks RN BSN CDE Diabetes Coordinator Pager: [phone]  8am-5pm

## 2020-05-06 NOTE — Progress Notes (Signed)
[facility] Progress Note   Subjective: Seen in room - no overnight issues. No CP/dyspnea. Some leg pain - stable. Did good with HD yesterday - 2.5L removed.  Objective Vitals:   05/05/20 1500 05/05/20 2021 05/06/20 0048 05/06/20 0450  BP: 105/83  115/65 111/83  Pulse: 90   91  Resp: 17  20 19   Temp: 98.6 F (37 C) 99.8 F (37.7 C)  98.2 F (36.8 C)  TempSrc: Oral Oral  Oral  SpO2: 98%   97%  Weight:      Height:       Physical Exam General:Well appearing man, NAD, overweight Heart:RRR; no murmur Lungs:CTAB Abdomen:soft, non-tender Extremities:No RLE edema; L BKA bandaged Dialysis Access:TDC in L chest (chronic)  Additional Objective Labs: Basic Metabolic Panel: Recent Labs  Lab 05/03/20 0411 05/03/20 0411 05/04/20 0521 05/04/20 1001 05/05/20 0338 05/06/20 0050  NA 134*   < > 132*  --  133* 134*  K 6.1*   < > 4.6  --  4.6 4.1  CL 92*   < > 93*  --  95* 94*  CO2 23   < > 24  --  23 27  GLUCOSE 226*   < > 162*  --  112* 97  BUN 70*   < > 39*  --  49* 25*  CREATININE 11.64*   < > 8.26*  --  10.30* 7.19*  CALCIUM 8.3*   < > 8.1*  --  8.2* 8.7*  PHOS 7.1*  --   --  6.1*  --   --    < > = values in this interval not displayed.   Liver Function Tests: Recent Labs  Lab 05/04/20 0521 05/05/20 0338 05/06/20 0050  AST 36 41 42*  ALT 21 17 18   ALKPHOS 74 75 80  BILITOT 0.6 0.4 0.6  PROT 7.2 7.4 7.4  ALBUMIN 2.1* 2.1* 2.2*   CBC: Recent Labs  Lab 04/29/20 1737 04/29/20 1737 04/30/20 0022 04/30/20 2014 05/01/20 0608 05/01/20 1603 05/02/20 0345 05/02/20 0345 05/02/20 1644 05/03/20 0411 05/04/20 0521  WBC 13.0*   < > 16.2*  --  14.8*  --  16.2*  --   --  13.1* 12.0*  NEUTROABS 11.3*  --   --   --   --   --   --   --   --   --   --   HGB 8.4*   < > 7.5*   < > 7.7*   < > 8.5*   < > 7.9* 7.9* 8.1*  HCT 27.9*   < > 25.0*   < > 26.0*   < > 27.4*   < > 26.1* 25.6* 27.8*  MCV 86.1   < > 86.5  --  87.5  --  87.5  --   --  87.7 88.8  PLT  351   < > 350  --  348  --  365  --   --  342 386   < > = values in this interval not displayed.   Medications: . sodium chloride 10 mL/hr at 05/02/20 0945   . vitamin C  1,000 mg Oral BID  . aspirin  81 mg Oral QODAY  . atorvastatin  40 mg Oral QHS  . carvedilol  3.125 mg Oral BID WC  . cetaphil   Topical Daily  . Chlorhexidine Gluconate Cloth  6 each Topical Q0600  . cinacalcet  90 mg Oral Q supper  . darbepoetin (ARANESP) injection -  DIALYSIS  100 mcg Intravenous Q Thu-HD  . doxercalciferol  6 mcg Intravenous Q T,Th,Sa-HD  . ferrous gluconate  324 mg Oral BID WC  . heparin injection (subcutaneous)  5,000 Units Subcutaneous Q8H  . insulin aspart  0-15 Units Subcutaneous TID WC  . insulin glargine  20 Units Subcutaneous QHS  . melatonin  5 mg Oral QHS  . menthol-cetylpyridinium  1 lozenge Oral Daily  . multivitamin  1 tablet Oral QHS  . pantoprazole  40 mg Oral Daily  . polyethylene glycol  17 g Oral Daily  . senna  1 tablet Oral Daily  . sevelamer carbonate  4.8 g Oral TID WC  . umeclidinium bromide  1 puff Inhalation Daily    Dialysis Orders: TTS @ [facility] 4.5h 400/800 126kg 2/2.25 bath Hep 12000bolusTDC -Mircera77mcg q 2wks - just reordered/not yet given,lastdose1/26/21 -Hectorol35mcg IV qHD  Assessment/Plan: 1. PAD with L leg ischemia + gangrene:X-ray w/o signs of osteomyelitis, on empiricVanc + Zosyn.VVS consulted-> underwent BLE arteriogram 8/6showingsevere bilateralPAD below the knees. 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 Donnetta Hutching. Being evaluated for CIR. 2. ESRD: Continue HD per TTS schedule - next 8/14. 3. Hypotension/volume: Chronic low BP - Coreg reduced. EDW will be lower on discharge. 4. AnemiaofESRD: Last Hgb8.1.Aranesp 138mcg q Thurs.No IV iron due to allergy- started on PO iron BID instead (tsat 6%, ferritin 218) 5. Secondary Hyperparathyroidism:CorrCa ok, Phos up slightly.Continue VDRA, sensipar,and binders. 6. Constipation: Givenmag  citrate + POdulcolax 8/8. 7. Nutrition- Renal diet w/fluid restrictions. Adding protein supps. 8. DMT2- Per primary 9. CAD s/p CABG - Per primary 10. Hx CVA 11. COPD 77. OSA - on CPAP  Veneta Penton, PA-C 05/06/2020, 10:37 AM  [employer]

## 2020-05-06 NOTE — Progress Notes (Signed)
Inpatient Rehab Admissions Coordinator:   I have a bed available for pt to admit to CIR today.  Dr. Wynetta Emery is in agreement.  Will let pt and TOC team know.   Shann Medal, PT, DPT Admissions Coordinator [phone] 05/06/20  11:27 AM

## 2020-05-07 ENCOUNTER — Inpatient Hospital Stay (HOSPITAL_COMMUNITY): Payer: No Typology Code available for payment source | Admitting: Physical Therapy

## 2020-05-07 ENCOUNTER — Inpatient Hospital Stay (HOSPITAL_COMMUNITY): Payer: Medicare Other

## 2020-05-07 ENCOUNTER — Inpatient Hospital Stay (HOSPITAL_COMMUNITY): Payer: No Typology Code available for payment source

## 2020-05-07 DIAGNOSIS — Z89512 Acquired absence of left leg below knee: Secondary | ICD-10-CM | POA: Diagnosis not present

## 2020-05-07 LAB — GLUCOSE, CAPILLARY
Glucose-Capillary: 143 mg/dL — ABNORMAL HIGH (ref 70–99)
Glucose-Capillary: 129 mg/dL — ABNORMAL HIGH (ref 70–99)
Glucose-Capillary: 111 mg/dL — ABNORMAL HIGH (ref 70–99)
Glucose-Capillary: 115 mg/dL — ABNORMAL HIGH (ref 70–99)

## 2020-05-07 NOTE — Progress Notes (Signed)
HD orders modified to 05/08/2020 per Dr Jonnie Finner.Lavonna Rua ,RN notified at [phone]

## 2020-05-07 NOTE — Evaluation (Signed)
Occupational Therapy Assessment and Plan  Patient Details  Name: Jonathon Bailey MRN: [id number] Date of Birth: 20-Feb-1956  OT Diagnosis: abnormal posture, acute pain, cognitive deficits, muscle weakness (generalized) and swelling of limb Rehab Potential:   ELOS: 2.5-3.5 weeks   Today's Date: 05/07/2020 OT Individual Time: 0900-1000 OT Individual Time Calculation (min): 60 min    Session 2: 45 min 1115-1200    Hospital Problem: Principal Problem:   S/P BKA (below knee amputation) unilateral, left (HCC)   Past Medical History:  Past Medical History:  Diagnosis Date  . Anxiety   . Arthritis   . Asthma    "as a child"  . CAD (coronary artery disease)   . Chronic systolic heart failure ([place]) 09/21/2008   ECHO Feb 2013 showed LVEF low normal at 50-55%, +hypokinetic anterolateral wall and inferolateral wall.    . CKD (chronic kidney disease) stage 4, GFR 15-29 ml/min (HCC) 08/30/2009   Progressive renal failure since 2008, creatinine 1.2 in 2008 up to 3.5 in 2012 and 3.2-5.0 in 2013. All UA's 2011-13 showed >300 protein on dipstick. Work-up in May 2011 showed negative Urine IFE and SPEP, ultrasound showed 12-13 cm kidneys with increased echogenicity and UPC ratio was 1.5 gm proteinuria.  Hgb A1C's from 2011 to 2013 were all between 9-11.  Patient saw Dr. Donnetta Hutching (vasc surgery) for HD access in Aug 2013 > vein mapping was done and Dr. Donnetta Hutching felt the left arm (pt is R handed) was suitable for L arm Cimino radiocephalic fistula. Patient said he wasn't ready to consider doing dialysis and declined the surgery.     . Depression   . Diabetic retinopathy ([place])   . ESRD (end stage renal disease) on dialysis ([place])   . HQIONGEX(528.4)    "maybe monthly" (03/15/2014)  . Hyperlipidemia   . Hypertension   . Macular edema   . Myocardial infarction [facility])    status post MI x2 and 3 stents placed in 2003  . Obesity   . Sleep apnea    does not use CPAP  . Stroke [facility] ~ 2007; ~1987   "weak on  right side; messed w/right side of brain; cry all the time"  . Type II diabetes mellitus (HCC)    insulin dependent   Past Surgical History:  Past Surgical History:  Procedure Laterality Date  . ABDOMINAL AORTOGRAM W/LOWER EXTREMITY N/A 04/29/2020   Procedure: ABDOMINAL AORTOGRAM W/LOWER EXTREMITY;  Surgeon: Angelia Mould, MD;  Location: [place] CV LAB;  Service: Cardiovascular;  Laterality: N/A;  . AMPUTATION Left 05/02/2020   Procedure: AMPUTATION BELOW KNEE;  Surgeon: Rosetta Posner, MD;  Location: [place];  Service: Vascular;  Laterality: Left;  . AV FISTULA PLACEMENT  10/17/2012   Procedure: ARTERIOVENOUS (AV) FISTULA CREATION;  Surgeon: Mal Misty, MD;  Location: [place];  Service: Vascular;  Laterality: Left;  . BONE EXOSTOSIS EXCISION Right 06/04/2019   Procedure: EXOSTOSIS EXCISION;  Surgeon: Leanora Cover, MD;  Location: [place];  Service: Orthopedics;  Laterality: Right;  . CAPD INSERTION N/A 03/15/2014   Procedure: LAPAROSCOPIC INSERTION CONTINUOUS AMBULATORY PERITONEAL DIALYSIS CATHETER, LAPARASCOPIC INCISIONAL HERNIA  REPAIR  WITH MESH, OMENTOPEXY AND LYSIS OF ADHESIONS;  Surgeon: [name], MD;  Location: [place];  Service: General;  Laterality: N/A;  . CORONARY ANGIOPLASTY WITH STENT PLACEMENT  ~ 2002   "3"  . CORONARY ANGIOPLASTY WITH STENT PLACEMENT  01/24/2004   successful PCI/stenting RCA  drug eluting cypher stent  . CORONARY ANGIOPLASTY  WITH STENT PLACEMENT  01/28/2004   successful stentin of a large bifurcation marginal branch of the ramus intermediate vessel  . FLEXIBLE SIGMOIDOSCOPY N/A 09/05/2014   Procedure: FLEXIBLE SIGMOIDOSCOPY;  Surgeon: Cleotis Nipper, MD;  Location: [facility] ENDOSCOPY;  Service: Endoscopy;  Laterality: N/A;  . HERNIA REPAIR    . INCISION AND DRAINAGE ABSCESS N/A 09/06/2014   Procedure: REMOVAL OF PD CATH;  Surgeon: Coralie Keens, MD;  Location: [place];  Service: General;  Laterality: N/A;  . INCISIONAL HERNIA REPAIR   03/15/2014  . LAPAROSCOPIC LYSIS OF ADHESIONS  03/15/2014  . NM MYOCAR PERF WALL MOTION  02/06/2012   normal perfusion scan  . PERITONEAL CATHETER INSERTION  03/15/2014  . REFRACTIVE SURGERY Bilateral   . SHUNTOGRAM N/A 03/11/2013   Procedure: Fistulogram;  Surgeon: Conrad Independence, MD;  Location: [facility] CATH LAB;  Service: Cardiovascular;  Laterality: N/A;  . TENDON TRANSFER Right 06/04/2019   Procedure: RIGHT HAND EXTENSOR TENDON TRANSFER TO SMALL FINGER;  Surgeon: Leanora Cover, MD;  Location: [place];  Service: Orthopedics;  Laterality: Right;  . UMBILICAL HERNIA REPAIR      Assessment & Plan Clinical Impression: 64 year old with history of CAD s/p CABG, ESRD on HD, type 2 diabetes on insulin, and cerebrovascular disease presenting with sepsis due to wet gangrene of left foot. 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  Patient currently requires max with basic self-care skills secondary to muscle weakness, decreased cardiorespiratoy endurance, decreased attention, decreased awareness, decreased problem solving, decreased safety awareness and decreased memory and decreased sitting balance, decreased standing balance, decreased postural control, decreased balance strategies and difficulty maintaining precautions.  Prior to hospitalization, patient could complete BADL/IADL with independent .  Patient will benefit from skilled intervention to decrease level of assist with basic self-care skills and increase independence with basic self-care skills prior to discharge home with care partner.  Anticipate patient will require 24 hour supervision and follow up home health.  OT - End of Session Endurance Deficit: Yes OT Assessment OT Barriers to Discharge: Decreased caregiver support;Home environment access/layout;Weight bearing restrictions;Lack of/limited family support OT Patient demonstrates impairments in the following area(s): Balance;Cognition;Edema;Endurance;Pain;Safety OT Basic ADL's  Functional Problem(s): Grooming;Bathing;Dressing;Toileting OT Transfers Functional Problem(s): Toilet;Tub/Shower OT Plan OT Intensity: Minimum of 1-2 x/day, 45 to 90 minutes OT Frequency: 5 out of 7 days OT Duration/Estimated Length of Stay: 2.5-3.5 weeks OT Treatment/Interventions: Balance/vestibular training;DME/adaptive equipment instruction;Patient/family education;Therapeutic Activities;Wheelchair propulsion/positioning;Therapeutic Exercise;Psychosocial support;Cognitive remediation/compensation;Functional mobility training;Community reintegration;Self Care/advanced ADL retraining;UE/LE Strength taining/ROM;UE/LE Coordination activities;Skin care/wound managment;Neuromuscular re-education;Discharge planning;Disease mangement/prevention;Pain management;Splinting/orthotics OT Self Feeding Anticipated Outcome(s): S OT Basic Self-Care Anticipated Outcome(s): S OT Toileting Anticipated Outcome(s): S OT Bathroom Transfers Anticipated Outcome(s): S OT Recommendation Patient destination: Home (unsure if family can provide the support needed at home)   OT Evaluation Precautions/Restrictions  Precautions Precautions: Fall Precaution Comments: recent L BKA Restrictions Weight Bearing Restrictions: Yes LLE Weight Bearing: Non weight bearing General Chart Reviewed: Yes Family/Caregiver Present: No Vital Signs   Pain Pain Assessment Pain Scale: 0-10 Pain Score: 0-No pain Pain Type: Acute pain;Surgical pain Pain Location: Leg Pain Orientation: Left Pain Descriptors / Indicators: Aching Pain Frequency: Constant Pain Onset: On-going Patients Stated Pain Goal: 5 Pain Intervention(s): Medication (See eMAR) (oxycodopne given) Home Living/Prior Functioning Home Living Available Help at Discharge: Family, Available PRN/intermittently Type of Home: Apartment Home Access: Level entry Home Layout: One level Bathroom Shower/Tub: Multimedia programmer: Standard Bathroom  Accessibility: Yes  Lives With: Son Prior Function Level of  Independence: Independent with gait, Independent with homemaking with ambulation, Independent with transfers, Independent with basic ADLs  Able to Take Stairs?: Yes Driving: Yes Vocation: On disability Comments: as L foot pain increased more reliant on SPC for short distance ambulation, independent in ADLs/iADLs Vision Baseline Vision/History: Macular Degeneration Patient Visual Report: No change from baseline Vision Assessment?: No apparent visual deficits Perception  Perception: Within Functional Limits Praxis Praxis: Intact Cognition Overall Cognitive Status: Within Functional Limits for tasks assessed Arousal/Alertness: Lethargic Orientation Level: Person;Place;Situation Person: Oriented Place: Oriented Situation: Oriented Year: 2021 Month: August Day of Week: Incorrect Immediate Memory Recall: Sock;Blue;Bed Memory Recall Sock: Not able to recall Memory Recall Blue: Without Cue Memory Recall Bed: Without Cue Safety/Judgment: Impaired Sensation Sensation Light Touch: Appears Intact Hot/Cold: Appears Intact Proprioception: Appears Intact Coordination Gross Motor Movements are Fluid and Coordinated: Yes (UE) Fine Motor Movements are Fluid and Coordinated: Yes (UE) Motor  Motor Motor: Within Functional Limits  Trunk/Postural Assessment  Cervical Assessment Cervical Assessment: Within Functional Limits Thoracic Assessment Thoracic Assessment: Within Functional Limits Lumbar Assessment Lumbar Assessment: Exceptions to [facility] (posterior pelvic tilt) Postural Control Postural Control: Deficits on evaluation  Balance Balance Balance Assessed: Yes Static Sitting Balance Static Sitting - Balance Support: Bilateral upper extremity supported (RLE support) Static Sitting - Level of Assistance: 6: Modified independent (Device/Increase time) Dynamic Sitting Balance Dynamic Sitting - Balance Support:  (RLE  support) Dynamic Sitting - Level of Assistance: 5: Stand by assistance Extremity/Trunk Assessment RUE Assessment RUE Assessment: Within Functional Limits LUE Assessment LUE Assessment: Within Functional Limits  Care Tool Care Tool Self Care Eating        Oral Care    Oral Care Assist Level: Set up assist    Bathing Bathing activity did not occur: Refused            Upper Body Dressing(including orthotics)   What is the patient wearing?: Pull over shirt   Assist Level: Minimal Assistance - Patient > 75% (EOB)    Lower Body Dressing (excluding footwear)   What is the patient wearing?: Pants Assist for lower body dressing: Maximal Assistance - Patient 25 - 49% (EOB)    (simulated)  Putting on/Taking off footwear   What is the patient wearing?: Non-skid slipper socks Assist for footwear: Dependent - Patient 0%   (simulated)     Care Tool Toileting Toileting activity   Assist for toileting: 2 Helpers     Care Tool Bed Mobility Roll left and right activity        Sit to lying activity        Lying to sitting edge of bed activity         Care Tool Transfers Sit to stand transfer Sit to stand activity did not occur: Safety/medical concerns      Chair/bed transfer   Chair/bed transfer assist level: 2 Chief Strategy Officer transfer activity did not occur: Safety/medical concerns Assist Level: 2 Helpers     Care Tool Cognition Expression of Ideas and Wants Expression of Ideas and Wants: Some difficulty - exhibits some difficulty with expressing needs and ideas (e.g, some words or finishing thoughts) or speech is not clear (likely d/t medications)   Understanding Verbal and Non-Verbal Content Understanding Verbal and Non-Verbal Content: Usually understands - understands most conversations, but misses some part/intent of message. Requires cues at times to understand (likely d/t medications)   Memory/Recall Ability *first 3 days only Memory/Recall  Ability *first 3 days only: Current season;Location of  own room;Staff names and faces;That he or she is in a hospital/hospital unit    Refer to Care Plan for [place] 1 OT Short Term Goal 1 (Week 1): Pt will trf with LRAD  to w/c in prep for toileting with MOD A OT Short Term Goal 2 (Week 1): Pt will thread BLE into pants with no more than CGA for sitting balance OT Short Term Goal 3 (Week 1): Pt will lean laterally EOB/EOM with S in prep for toileting  Recommendations for other services: None    Skilled Therapeutic Intervention Session 1: Pt educated on role/purpose of OT, CIR, ELOS, and POC. Pt lethargic throughout session suspect d/t medications. Pt agreeable to BADL at EOB, however after eating ice chips pt with nausea and dry heaves sputum. RN alerted and delivers meds/suppository. Pt completes SBT back to bed with MAX A and +2 steadying equipemnt. Pt unable ot follow cues for head hips relationship and requres increase A d/t gown sticking. Pt receiving suppository from RN and Ot applies towel and coboan onto amputee pad for proper positioning and knee extension in sitting. D/t suppository pt elects to terminate session and not get dressed as will need to undress for BM later. Pt misses 15 min skilled OT.   Session 2: pt with no success voiding on bedpan. Pt requires encouragement to engage in [facility] transfer. Pt requires MOD A +2 to transfer with chuck pad under buttocks to bariatric DABSC. Chuck and board removed with lateral lean and pt able to void bowel on BSC. Pt requires increased time to void bowel on BSC. Pt requries total A to complete hygiene as well as transfer back to EOB via SBT with MOD A of 1 with poor head hips relationship not listening to OT cues only getting 50% across the board and finally OT cues pt to roll back onto his back and roll onto the bed. Exited session with pt seated in bed, exit alarm on and call light tin reach.  Mobility  Bed  Mobility Bed Mobility: Rolling Right;Rolling Left;Sit to Supine;Supine to Sit;Scooting to [facility] Rolling Right: Maximal Assistance - Patient 25-49% Rolling Left: Maximal Assistance - Patient 25-49% Supine to Sit: Maximal Assistance - Patient - Patient 25-49% Sit to Supine: Maximal Assistance - Patient 25-49% Scooting to HOB: Moderate Assistance - Patient 50-74%   Discharge Criteria: Patient will be discharged from OT if patient refuses treatment 3 consecutive times without medical reason, if treatment goals not met, if there is a change in medical status, if patient makes no progress towards goals or if patient is discharged from hospital.  The above assessment, treatment plan, treatment alternatives and goals were discussed and mutually agreed upon: by patient  Tonny Branch 05/07/2020, 10:04 AM

## 2020-05-07 NOTE — Progress Notes (Signed)
HD RN informed Probation officer that they are not able to get pt to HD. Pt will be going to HD tomorrow (Sunday).   Gerald Stabs, RN

## 2020-05-07 NOTE — Plan of Care (Signed)
  Problem: Consults Goal: RH LIMB LOSS PATIENT EDUCATION Description: Description: See Patient Education module for eduction specifics. Outcome: Progressing   Problem: RH BOWEL ELIMINATION Goal: RH STG MANAGE BOWEL WITH ASSISTANCE Description: STG Manage Bowel with mod I Assistance. Outcome: Progressing Goal: RH STG MANAGE BOWEL W/MEDICATION W/ASSISTANCE Description: STG Manage Bowel with Medication with mod I Assistance. Outcome: Progressing   Problem: RH SKIN INTEGRITY Goal: RH STG ABLE TO PERFORM INCISION/WOUND CARE W/ASSISTANCE Description: STG Able To Perform Incision/Wound Care With mod I Assistance. Outcome: Progressing   Problem: RH SAFETY Goal: RH STG ADHERE TO SAFETY PRECAUTIONS W/ASSISTANCE/DEVICE Description: STG Adhere to Safety Precautions With cues and reminders  Outcome: Progressing   Problem: RH PAIN MANAGEMENT Goal: RH STG PAIN MANAGED AT OR BELOW PT'S PAIN GOAL Description: Pain level less than 5 on scale of 0-10 Outcome: Progressing   Problem: RH KNOWLEDGE DEFICIT LIMB LOSS Goal: RH STG INCREASE KNOWLEDGE OF SELF CARE AFTER LIMB LOSS Description: Pt will be able to adhere to medication regimen, dietary and lifestyle modification to prevent complications related to DM and ESRD with mod I assist using handouts and booklets provided.  Pt will be able to perform dressing changes and wrap stump site with mod I assist.  Outcome: Progressing

## 2020-05-07 NOTE — Progress Notes (Signed)
[place] PHYSICAL MEDICINE & REHABILITATION PROGRESS NOTE   Subjective/Complaints: +cough, congestion. Denies pain, constipation, insomnia.   ROS: +cough, congestion. Denies pain, constipation, insomnia.   Objective:   No results found. No results for input(s): WBC, HGB, HCT, PLT in the last 72 hours. Recent Labs    05/05/20 0338 05/06/20 0050  NA 133* 134*  K 4.6 4.1  CL 95* 94*  CO2 23 27  GLUCOSE 112* 97  BUN 49* 25*  CREATININE 10.30* 7.19*  CALCIUM 8.2* 8.7*    Intake/Output Summary (Last 24 hours) at 05/07/2020 1537 Last data filed at 05/07/2020 0816 Gross per 24 hour  Intake 650 ml  Output --  Net 650 ml     Physical Exam: Vital Signs Blood pressure 96/71, pulse 91, temperature 99.4 F (37.4 C), resp. rate 18, height 6' (1.829 m), weight 121.3 kg, SpO2 94 %. General: Alert and oriented x 3, No apparent distress HEENT: Head is normocephalic, atraumatic, PERRLA, EOMI, sclera anicteric, oral mucosa pink and moist, dentition intact, ext ear canals clear,  Neck: Supple without JVD or lymphadenopathy Heart: Reg rate and rhythm. No murmurs rubs or gallops Chest: +cough, congestion Abdomen: Soft, non-tender, non-distended, bowel sounds positive. Musculoskeletal:     Cervical back: Normal range of motion and neck supple.     Comments: Left BKA with edema and tenderness  Skin:    Comments: Left BKA with dressing CDI  Neurological:     Mental Status: He is alert and oriented to person, place, and time.     Comments: Alert Motor: Bilateral upper extremities: 5/5 proximal nose Left lower extremity: 4+/5 proximal distal Right lower extremity: Hip flexion 4/5 (pain inhibition) Limited extension of the right knee Sensation intact to light touch  Psychiatric:        Mood and Affect: Mood normal.        Behavior: Behavior normal.      Assessment/Plan: 1. Functional deficits secondary to left BKA which require 3+ hours per day of interdisciplinary therapy in a  comprehensive inpatient rehab setting.  Physiatrist is providing close team supervision and 24 hour management of active medical problems listed below.  Physiatrist and rehab team continue to assess barriers to discharge/monitor patient progress toward functional and medical goals  Care Tool:  Bathing  Bathing activity did not occur: Refused           Bathing assist       Upper Body Dressing/Undressing Upper body dressing   What is the patient wearing?: Hospital gown only    Upper body assist Assist Level: Minimal Assistance - Patient > 75% (EOB)    Lower Body Dressing/Undressing Lower body dressing    Lower body dressing activity did not occur: N/A What is the patient wearing?: Pants     Lower body assist Assist for lower body dressing: Maximal Assistance - Patient 25 - 49% (EOB)     Toileting Toileting Toileting Activity did not occur (Clothing management and hygiene only): N/A (no void or bm)  Toileting assist Assist for toileting: 2 Helpers     Transfers Chair/bed transfer  Transfers assist     Chair/bed transfer assist level: Total Assistance - Patient < 25%     Locomotion Ambulation   Ambulation assist   Ambulation activity did not occur: Safety/medical concerns          Walk 10 feet activity   Assist  Walk 10 feet activity did not occur: Safety/medical concerns        Walk  50 feet activity   Assist Walk 50 feet with 2 turns activity did not occur: Safety/medical concerns         Walk 150 feet activity   Assist Walk 150 feet activity did not occur: Safety/medical concerns         Walk 10 feet on uneven surface  activity   Assist Walk 10 feet on uneven surfaces activity did not occur: Safety/medical concerns         Wheelchair     Assist Will patient use wheelchair at discharge?: Yes Type of Wheelchair: Manual    Wheelchair assist level: Moderate Assistance - Patient 50 - 74% Max wheelchair distance: 150'     Wheelchair 50 feet with 2 turns activity    Assist        Assist Level: Minimal Assistance - Patient > 75%   Wheelchair 150 feet activity     Assist      Assist Level: Moderate Assistance - Patient 50 - 74%   Blood pressure 96/71, pulse 91, temperature 99.4 F (37.4 C), resp. rate 18, height 6' (1.829 m), weight 121.3 kg, SpO2 94 %.    Medical Problem List and Plan: 1.  Limitations with mobility, transfers, endurance, self-care secondary to left BKA.             -patient may not shower             -ELOS/Goals: 12-16 days/supervision/min a             Initial CIR evaluations today.  2.  Antithrombotics: -DVT/anticoagulation:  Pharmaceutical: Heparin             -antiplatelet therapy: ASA daily  3. Pain Management: Oxycodone as needed --DC IV Dilaudid (was using once a day on average). Well controlled.              Monitor with increased exertion. 4. Mood: LCSW to              -antipsychotic agents: N/a 5. Neuropsych: This patient is  capable of making decisions on his own behalf. 6. Skin/Wound Care: Daily dressing changes. Monitor wound for healing.  Vitamin C for healing.  Prosource added to promote wound healing.  7. Fluids/Electrolytes/Nutrition: Monitor I/O.  Modified/renal diet with 1200 ccFR/day. Labs monitored with HD. 8.  T2DM with hyperglycemia:  Hgb A1c- 7.9.  Continue Lantus 20 units daily (On levemir 75 mg bid/Novolog 5 untis bid PTA- reports eats better at home). Well controlled.  Continue to monitor BS ac/hs and use SSI for elevated blood sugars.             Monitor with increased mobility 9.  ESRD: Schedule hemodialysis at the end of the day on TTS to help with tolerance of activity.  Metabolic bone disease managed with Hectorol and Renvela. 10.  COPD: Respiratory status stable on Incruse daily 11.  Anemia of chronic disease: Serial CBCs with hemodialysis.  On IV Aranesp weekly with Wynonia Lawman twice daily. Hgb 8.1 on 8/11 12.  History of CAD: Carvedilol  decreased due to hypotension --continue Coreg 3.125 mg bid with Lipitor and ASA.             Monitor with increased mobility.  LOS: 1 days A FACE TO FACE EVALUATION WAS PERFORMED  Clide Deutscher Cashel Bellina 05/07/2020, 3:37 PM

## 2020-05-07 NOTE — Evaluation (Signed)
Physical Therapy Assessment and Plan  Patient Details  Name: Jonathon Bailey MRN: [id number] Date of Birth: 06-21-1956  PT Diagnosis: Difficulty walking, Muscle weakness and Pain in L surgery site Rehab Potential: Good ELOS: 20 days-25days   Today's Date: 05/07/2020 PT Individual Time: 8315-1761 PT Individual Time Calculation (min): 75 min    Hospital Problem: Principal Problem:   S/P BKA (below knee amputation) unilateral, left ([place])   Past Medical History:  Past Medical History:  Diagnosis Date  . Anxiety   . Arthritis   . Asthma    "as a child"  . CAD (coronary artery disease)   . Chronic systolic heart failure ([place]) 09/21/2008   ECHO Feb 2013 showed LVEF low normal at 50-55%, +hypokinetic anterolateral wall and inferolateral wall.    . CKD (chronic kidney disease) stage 4, GFR 15-29 ml/min (HCC) 08/30/2009   Progressive renal failure since 2008, creatinine 1.2 in 2008 up to 3.5 in 2012 and 3.2-5.0 in 2013. All UA's 2011-13 showed >300 protein on dipstick. Work-up in May 2011 showed negative Urine IFE and SPEP, ultrasound showed 12-13 cm kidneys with increased echogenicity and UPC ratio was 1.5 gm proteinuria.  Hgb A1C's from 2011 to 2013 were all between 9-11.  Patient saw Dr. Donnetta Hutching (vasc surgery) for HD access in Aug 2013 > vein mapping was done and Dr. Donnetta Hutching felt the left arm (pt is R handed) was suitable for L arm Cimino radiocephalic fistula. Patient said he wasn't ready to consider doing dialysis and declined the surgery.     . Depression   . Diabetic retinopathy ([place])   . ESRD (end stage renal disease) on dialysis ([place])   . YWVPXTGG(269.4)    "maybe monthly" (03/15/2014)  . Hyperlipidemia   . Hypertension   . Macular edema   . Myocardial infarction [facility]    status post MI x2 and 3 stents placed in 2003  . Obesity   . Sleep apnea    does not use CPAP  . Stroke [facility]) ~ 2007; ~1987   "weak on right side; messed w/right side of brain; cry all the time"  . Type II  diabetes mellitus (HCC)    insulin dependent   Past Surgical History:  Past Surgical History:  Procedure Laterality Date  . ABDOMINAL AORTOGRAM W/LOWER EXTREMITY N/A 04/29/2020   Procedure: ABDOMINAL AORTOGRAM W/LOWER EXTREMITY;  Surgeon: Angelia Mould, MD;  Location: [place] [facility];  Service: Cardiovascular;  Laterality: N/A;  . AMPUTATION Left 05/02/2020   Procedure: AMPUTATION BELOW KNEE;  Surgeon: Rosetta Posner, MD;  Location: [place];  Service: Vascular;  Laterality: Left;  . AV FISTULA PLACEMENT  10/17/2012   Procedure: ARTERIOVENOUS (AV) FISTULA CREATION;  Surgeon: Mal Misty, MD;  Location: [place];  Service: Vascular;  Laterality: Left;  . BONE EXOSTOSIS EXCISION Right 06/04/2019   Procedure: EXOSTOSIS EXCISION;  Surgeon: Leanora Cover, MD;  Location: [place];  Service: Orthopedics;  Laterality: Right;  . CAPD INSERTION N/A 03/15/2014   Procedure: LAPAROSCOPIC INSERTION CONTINUOUS AMBULATORY PERITONEAL DIALYSIS CATHETER, LAPARASCOPIC INCISIONAL HERNIA  REPAIR  WITH MESH, OMENTOPEXY AND LYSIS OF ADHESIONS;  Surgeon: [name], MD;  Location: [place];  Service: General;  Laterality: N/A;  . CORONARY ANGIOPLASTY WITH STENT PLACEMENT  ~ 2002   "3"  . CORONARY ANGIOPLASTY WITH STENT PLACEMENT  01/24/2004   successful PCI/stenting RCA  drug eluting cypher stent  . CORONARY ANGIOPLASTY WITH STENT PLACEMENT  01/28/2004   successful stentin of a large  bifurcation marginal branch of the ramus intermediate vessel  . FLEXIBLE SIGMOIDOSCOPY N/A 09/05/2014   Procedure: FLEXIBLE SIGMOIDOSCOPY;  Surgeon: Florencia Reasons, MD;  Location: [facility] ENDOSCOPY;  Service: Endoscopy;  Laterality: N/A;  . HERNIA REPAIR    . INCISION AND DRAINAGE ABSCESS N/A 09/06/2014   Procedure: REMOVAL OF PD CATH;  Surgeon: [name], MD;  Location: [facility] OR;  Service: General;  Laterality: N/A;  . INCISIONAL HERNIA REPAIR  03/15/2014  . LAPAROSCOPIC LYSIS OF ADHESIONS  03/15/2014  . NM MYOCAR  PERF WALL MOTION  02/06/2012   normal perfusion scan  . PERITONEAL CATHETER INSERTION  03/15/2014  . REFRACTIVE SURGERY Bilateral   . SHUNTOGRAM N/A 03/11/2013   Procedure: Fistulogram;  Surgeon: Fransisco Hertz, MD;  Location: [facility] CATH LAB;  Service: Cardiovascular;  Laterality: N/A;  . TENDON TRANSFER Right 06/04/2019   Procedure: RIGHT HAND EXTENSOR TENDON TRANSFER TO SMALL FINGER;  Surgeon: Betha Loa, MD;  Location: [facility];  Service: Orthopedics;  Laterality: Right;  . UMBILICAL HERNIA REPAIR      Assessment & Plan Clinical Impression: Patient is a 64 y.o. year old male with history of CAD s/p CABG, macular degeneration, ESRD-HD TTS, T2DM who was admitted 04/27/2020 with sepsis due to wet gangrene left foot.  History taken from chart review and patient.  He was started on IV antibiotics with attempts at limb salvage.  Patient with unfavorable anatomy for revascularization attempts and BKA recommended due to concerns of wound healing.  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  Postop reported visual changes and CT head done which was unremarkable for acute intracranial process.  He has had issues with hypotension and carvedilol was held briefly then resumed at lower dose.  Bowel program has been augmented to manage constipation.  Hospital course further complicated by acute on chronic anemia managed with increasing Aranesp dose--unable to tolerate IV iron due to allergy.  Therapy evaluations completed revealing DOE as well as tachycardia due to debility as well as limitations due to left BKA.  CIR was recommended due to functional decline.  Please see preadmission assessment from earlier today as well..  Patient transferred to CIR on 05/06/2020 .   Patient currently requires max with mobility secondary to muscle weakness, decreased cardiorespiratoy endurance and decreased sitting balance, decreased standing balance, decreased balance strategies and difficulty  maintaining precautions.  Prior to hospitalization, patient was independent  with mobility and lived with Son in a Apartment home.  Home access is  Level entry.  Patient will benefit from skilled PT intervention to maximize safe functional mobility, minimize fall risk and decrease caregiver burden for planned discharge home with 24 hour assist.  Anticipate patient will benefit from follow up [facility] at discharge.  PT - End of Session Activity Tolerance: Tolerates 30+ min activity with multiple rests Endurance Deficit: Yes PT Assessment Rehab Potential (ACUTE/IP ONLY): Good PT Barriers to Discharge: Decreased caregiver support;Weight bearing restrictions;Weight;Wound Care PT Patient demonstrates impairments in the following area(s): Balance;Skin Integrity;Endurance;Pain PT Transfers Functional Problem(s): Bed Mobility;Bed to Chair;Car PT Locomotion Functional Problem(s): Wheelchair Mobility PT Plan PT Intensity: Minimum of 1-2 x/day ,45 to 90 minutes PT Frequency: 5 out of 7 days PT Duration Estimated Length of Stay: 20 days-25days PT Treatment/Interventions: [employer] reintegration;Discharge planning;Disease management/prevention;Balance/vestibular training;DME/adaptive equipment instruction;Functional mobility training;Neuromuscular re-education;Psychosocial support;UE/LE Strength taining/ROM;Wheelchair propulsion/positioning;UE/LE Coordination activities;Therapeutic Activities;Skin care/wound management;Pain management;Patient/family education;Splinting/orthotics;Therapeutic Exercise PT Transfers Anticipated Outcome(s): supervision PT Locomotion Anticipated Outcome(s): supervision with WC PT Recommendation Recommendations for  Other Services: Therapeutic Recreation consult Therapeutic Recreation Interventions: Stress management Follow Up Recommendations: Home health PT;24 hour supervision/assistance Patient destination: Home Equipment Details: TBD   PT  Evaluation Precautions/Restrictions Precautions Precautions: Fall Precaution Comments: recent L BKA Restrictions Weight Bearing Restrictions: Yes LLE Weight Bearing: Non weight bearing Pain Pain Assessment Pain Scale: 0-10 at start of session; increased with mobility to 10/10 with transfers decreased to 5/10 with rest and pt reported improved overall pain level and agreeable to WC mobility. Pain Type: Acute pain;Surgical pain Pain Location: Leg Pain Orientation: Left Pain Descriptors / Indicators: Aching Pain Frequency: Constant with mobility Pain Onset: On-going Patients Stated Pain Goal: 5 Pain Intervention(s): rest and denied pain medication when asked by PT during session if he would like PT to ask nursing for medication.  Home Living/Prior Functioning Home Living Available Help at Discharge: Family;Available PRN/intermittently Type of Home: Apartment Home Access: Level entry Home Layout: One level Bathroom Shower/Tub: Multimedia programmer: Standard Bathroom Accessibility: Yes  Lives With: Son Prior Function Level of Independence: Independent with gait;Independent with homemaking with ambulation;Independent with transfers;Independent with basic ADLs  Able to Take Stairs?: Yes Driving: Yes Vocation: On disability Comments: as L foot pain increased more reliant on SPC for short distance ambulation, independent in ADLs/iADLs Vision/Perception  Perception Perception: Within Functional Limits Praxis Praxis: Intact  Cognition Overall Cognitive Status: Within Functional Limits for tasks assessed Arousal/Alertness: Lethargic Orientation Level: Oriented X4 Immediate Memory Recall: Sock;Blue;Bed Memory Recall Sock: Not able to recall Memory Recall Blue: Without Cue Memory Recall Bed: Without Cue Safety/Judgment: Impaired Sensation Sensation Light Touch: Appears Intact Hot/Cold: Appears Intact Proprioception: Appears Intact Coordination Gross Motor Movements  are Fluid and Coordinated: Yes (UE) Fine Motor Movements are Fluid and Coordinated: Yes (UE) Motor  Motor Motor: Within Functional Limits   Trunk/Postural Assessment  Cervical Assessment Cervical Assessment: Within Functional Limits Thoracic Assessment Thoracic Assessment: Within Functional Limits Lumbar Assessment Lumbar Assessment: Exceptions to [facility] (posterior pelvic tilt) Postural Control Postural Control: Deficits on evaluation  Balance Balance Balance Assessed: Yes Static Sitting Balance Static Sitting - Balance Support: Bilateral upper extremity supported (RLE support) Static Sitting - Level of Assistance: 6: Modified independent (Device/Increase time) Dynamic Sitting Balance Dynamic Sitting - Balance Support:  (RLE support) Dynamic Sitting - Level of Assistance: 5: Stand by assistance Extremity Assessment  RUE Assessment RUE Assessment: Within Functional Limits LUE Assessment LUE Assessment: Within Functional Limits RLE Assessment RLE Assessment: Exceptions to [facility] Active Range of Motion (AROM) Comments: WFL General Strength Comments: 3+/5 hip flexion; all other 4/5 LLE Assessment LLE Assessment: Exceptions to [facility] Active Range of Motion (AROM) Comments: WFL General Strength Comments: hip flexion, adduction and abduction 3/5; knee flexion and extension 3/5; no ankle strength 2/2 BKA  Care Tool Care Tool Bed Mobility Roll left and right activity   Roll left and right assist level: Moderate Assistance - Patient 50 - 74%    Sit to lying activity   Sit to lying assist level: Moderate Assistance - Patient 50 - 74%    Lying to sitting edge of bed activity   Lying to sitting edge of bed assist level: Moderate Assistance - Patient 50 - 74%     Care Tool Transfers Sit to stand transfer Sit to stand activity did not occur: Safety/medical concerns      Chair/bed transfer   Chair/bed transfer assist level: Total Assistance - Patient < 25%     Toilet transfer Toilet  transfer activity did not occur: Safety/medical concerns  Scientist, product/process development transfer activity did not occur: Safety/medical concerns        Care Tool Locomotion Ambulation Ambulation activity did not occur: Safety/medical concerns        Walk 10 feet activity Walk 10 feet activity did not occur: Safety/medical concerns       Walk 50 feet with 2 turns activity Walk 50 feet with 2 turns activity did not occur: Safety/medical concerns      Walk 150 feet activity Walk 150 feet activity did not occur: Safety/medical concerns      Walk 10 feet on uneven surfaces activity Walk 10 feet on uneven surfaces activity did not occur: Safety/medical concerns      Stairs Stair activity did not occur: Safety/medical concerns        Walk up/down 1 step activity Walk up/down 1 step or curb (drop down) activity did not occur: Safety/medical concerns     Walk up/down 4 steps activity did not occuR: Safety/medical concerns  Walk up/down 4 steps activity      Walk up/down 12 steps activity Walk up/down 12 steps activity did not occur: Safety/medical concerns      Pick up small objects from floor Pick up small object from the floor (from standing position) activity did not occur: Safety/medical concerns      Wheelchair Will patient use wheelchair at discharge?: Yes Type of Wheelchair: Manual   Wheelchair assist level: Moderate Assistance - Patient 50 - 74% Max wheelchair distance: 150'  Wheel 50 feet with 2 turns activity   Assist Level: Minimal Assistance - Patient > 75%  Wheel 150 feet activity   Assist Level: Moderate Assistance - Patient 50 - 74%    Refer to Care Plan for Long Term Goals  SHORT TERM GOAL WEEK 1 PT Short Term Goal 1 (Week 1): pt to demonstrate supine<>sit at min A x1 PT Short Term Goal 2 (Week 1): pt to demonstrate transfers with LRAD at min A x1 PT Short Term Goal 4 (Week 1): pt to initiate standing with RW PT Short Term Goal 5 (Week 1): pt to mobilize WC  150' min A  Recommendations for other services: Therapeutic Recreation  Stress management  Skilled Therapeutic Intervention Evaluation completed (see details above and below) with education on PT POC and goals and individual treatment initiated with focus on  pt received in bed and agreeable to therapy. Pt directed in bed mobility with rolling x2 R and L mod A with VC for technique and pt required bed rails; pt directed in supine>sit mod A; pt denied dressing other than gown.Then requested to return to supine for improved positioning, mod A sit>supine, mod A supine>sit with improved positioning at EOB. Pt directed in EOB sitting balance at CGA with min A for improved positioning an VC for RLE placement and trunk extension. Attempted to direct pt in STS from EOB with bed elevated however pt unable to complete full stand with max A, returned to sitting EOB. Pt directed in lateral scooting transfer to R to [facility] with arm rest removed, max A and extra time to complete overall, VC for technique and safety. Pt able to reposition self in [facility] for improved hip alignment and upright sitting position with VC, CGA. Pt given 20x18 manual WC, with foam cushion, L amputation leg rest and R standard leg rest. Pt directed in WC mobility in hallway for 150' total with multiple rest breaks at mod A overall to complete and extra time for turns. Pt reported fatigue at  this point and requested to rest before further activity. Pt then directed in seated RLE strengthening exercises of x10 3# marching, LAQ with visual demo for technique. Pt reported he felt tired and that he was sleepy. Pt directed to returning to room in [facility] at mod A for forward propulsion and distance of 150' pt denied having PT fully assist with pushing of WC and adamant he wheel himself grossly mod A to complete. Pt returned to room and requested to stay in [facility] at this time until next therapy session. Pt left in WC, alarm belt set, BLE on leg rests, All needs in reach  and in good condition. Call light in hand.     Mobility Bed Mobility Bed Mobility: Rolling Right;Rolling Left;Sit to Supine;Supine to Sit;Scooting to [facility] Rolling Right: Moderate Assistance - Patient 50-74% Rolling Left: Moderate Assistance - Patient 50-74% Supine to Sit: Moderate Assistance - Patient 50-74% Sit to Supine: Moderate Assistance - Patient 50-74% Scooting to HOB: Moderate Assistance - Patient 50-74% Transfers Transfers: Lateral/Scoot Transfers Lateral/Scoot Transfers: 2 Helpers (to stedy equpiment) Transfer (Assistive device):  (SB) Locomotion  Gait Ambulation: No Gait Gait: No Stairs / Additional Locomotion Stairs: No Wheelchair Mobility Wheelchair Mobility: Yes Wheelchair Assistance: Moderate Assistance - Patient 50 - 74% Wheelchair Propulsion: Both upper extremities Wheelchair Parts Management: Needs assistance;Supervision/cueing Distance: 150 '   Discharge Criteria: Patient will be discharged from PT if patient refuses treatment 3 consecutive times without medical reason, if treatment goals not met, if there is a change in medical status, if patient makes no progress towards goals or if patient is discharged from hospital.  The above assessment, treatment plan, treatment alternatives and goals were discussed and mutually agreed upon: by patient  Junie Panning 05/07/2020, 10:38 AM

## 2020-05-08 DIAGNOSIS — Z89512 Acquired absence of left leg below knee: Secondary | ICD-10-CM | POA: Diagnosis not present

## 2020-05-08 LAB — RENAL FUNCTION PANEL
Albumin: 2.1 g/dL — ABNORMAL LOW (ref 3.5–5.0)
Anion gap: 16 — ABNORMAL HIGH (ref 5–15)
BUN: 53 mg/dL — ABNORMAL HIGH (ref 8–23)
CO2: 23 mmol/L (ref 22–32)
Calcium: 8.1 mg/dL — ABNORMAL LOW (ref 8.9–10.3)
Chloride: 92 mmol/L — ABNORMAL LOW (ref 98–111)
Creatinine, Ser: 12.34 mg/dL — ABNORMAL HIGH (ref 0.61–1.24)
GFR calc Af Amer: 4 mL/min — ABNORMAL LOW (ref >60)
GFR calc non Af Amer: 4 mL/min — ABNORMAL LOW (ref >60)
Glucose, Bld: 123 mg/dL — ABNORMAL HIGH (ref 70–99)
Phosphorus: 6.3 mg/dL — ABNORMAL HIGH (ref 2.5–4.6)
Potassium: 4.9 mmol/L (ref 3.5–5.1)
Sodium: 131 mmol/L — ABNORMAL LOW (ref 135–145)

## 2020-05-08 LAB — CBC
HCT: 25.3 % — ABNORMAL LOW (ref 39.0–52.0)
Hemoglobin: 7.4 g/dL — ABNORMAL LOW (ref 13.0–17.0)
MCH: 26.3 pg (ref 26.0–34.0)
MCHC: 29.2 g/dL — ABNORMAL LOW (ref 30.0–36.0)
MCV: 90 fL (ref 80.0–100.0)
Platelets: 478 10*3/uL — ABNORMAL HIGH (ref 150–400)
RBC: 2.81 MIL/uL — ABNORMAL LOW (ref 4.22–5.81)
RDW: 17.6 % — ABNORMAL HIGH (ref 11.5–15.5)
WBC: 13.7 10*3/uL — ABNORMAL HIGH (ref 4.0–10.5)
nRBC: 0.4 % — ABNORMAL HIGH (ref 0.0–0.2)

## 2020-05-08 LAB — GLUCOSE, CAPILLARY
Glucose-Capillary: 104 mg/dL — ABNORMAL HIGH (ref 70–99)
Glucose-Capillary: 96 mg/dL (ref 70–99)
Glucose-Capillary: 120 mg/dL — ABNORMAL HIGH (ref 70–99)

## 2020-05-08 MED ORDER — HEPARIN SODIUM (PORCINE) 1000 UNIT/ML IJ SOLN
INTRAMUSCULAR | Status: AC
  Administered 2020-05-08: 4000 [IU] via INTRAVENOUS_CENTRAL
  Filled 2020-05-08: qty 4

## 2020-05-08 MED ORDER — HEPARIN SODIUM (PORCINE) 1000 UNIT/ML DIALYSIS: 4000.0000 [IU] | INTRAMUSCULAR | Status: AC | PRN

## 2020-05-08 NOTE — Progress Notes (Signed)
[place] PHYSICAL MEDICINE & REHABILITATION PROGRESS NOTE   Subjective/Complaints: Just got back from dialysis- tolerated well.  CXR shows mild left basilar atelectasis- encourage use of IS  ROS: +cough, congestion. Denies pain, constipation, insomnia.   Objective:   DG CHEST PORT 1 VIEW  Result Date: 05/07/2020 CLINICAL DATA:  Congestion of respiratory tract. EXAM: PORTABLE CHEST 1 VIEW COMPARISON:  April 04, 2018 FINDINGS: Multiple sternal wires are seen. There is stable left-sided venous catheter positioning. Mild atelectasis and/or scarring is again seen within the left lung base. There is no evidence of a pleural effusion or pneumothorax. The cardiac silhouette is borderline in size. The visualized skeletal structures are unremarkable. IMPRESSION: Mild left basilar atelectasis and/or scarring. Electronically Signed   By: Virgina Norfolk M.D.   On: 05/07/2020 16:50   Recent Labs    05/08/20 1148  WBC 13.7*  HGB 7.4*  HCT 25.3*  PLT 478*   Recent Labs    05/06/20 0050 05/08/20 1149  NA 134* 131*  K 4.1 4.9  CL 94* 92*  CO2 27 23  GLUCOSE 97 123*  BUN 25* 53*  CREATININE 7.19* 12.34*  CALCIUM 8.7* 8.1*    Intake/Output Summary (Last 24 hours) at 05/08/2020 1620 Last data filed at 05/08/2020 1518 Gross per 24 hour  Intake 350 ml  Output 1500 ml  Net -1150 ml     Physical Exam: Vital Signs Blood pressure 111/78, pulse 88, temperature 98.3 F (36.8 C), temperature source Oral, resp. rate 18, height 6' (1.829 m), weight 121 kg, SpO2 90 %. General: Alert and oriented x 3, No apparent distress HEENT: Head is normocephalic, atraumatic, PERRLA, EOMI, sclera anicteric, oral mucosa pink and moist, dentition intact, ext ear canals clear,  Neck: Supple without JVD or lymphadenopathy Heart: Reg rate and rhythm. No murmurs rubs or gallops Chest: +cough, congestion Abdomen: Soft, non-tender, non-distended, bowel sounds positive. Musculoskeletal:     Cervical back: Normal  range of motion and neck supple.     Comments: Left BKA with edema and tenderness  Skin:    Comments: Left BKA with dressing CDI  Neurological:     Mental Status: He is alert and oriented to person, place, and time.     Comments: Alert Motor: Bilateral upper extremities: 5/5 proximal nose Left lower extremity: 4+/5 proximal distal Right lower extremity: Hip flexion 4/5 (pain inhibition) Limited extension of the right knee Sensation intact to light touch  Psychiatric:        Mood and Affect: Mood normal.        Behavior: Behavior normal.       Assessment/Plan: 1. Functional deficits secondary to left BKA which require 3+ hours per day of interdisciplinary therapy in a comprehensive inpatient rehab setting.  Physiatrist is providing close team supervision and 24 hour management of active medical problems listed below.  Physiatrist and rehab team continue to assess barriers to discharge/monitor patient progress toward functional and medical goals  Care Tool:  Bathing  Bathing activity did not occur: Refused           Bathing assist       Upper Body Dressing/Undressing Upper body dressing   What is the patient wearing?: Hospital gown only    Upper body assist Assist Level: Minimal Assistance - Patient > 75% (EOB)    Lower Body Dressing/Undressing Lower body dressing    Lower body dressing activity did not occur: N/A What is the patient wearing?: Pants     Lower body assist Assist  for lower body dressing: Maximal Assistance - Patient 25 - 49% (EOB)     Toileting Toileting Toileting Activity did not occur Landscape architect and hygiene only): N/A (no void or bm)  Toileting assist Assist for toileting: 2 Helpers     Transfers Chair/bed transfer  Transfers assist     Chair/bed transfer assist level: Total Assistance - Patient < 25%     Locomotion Ambulation   Ambulation assist   Ambulation activity did not occur: Safety/medical concerns           Walk 10 feet activity   Assist  Walk 10 feet activity did not occur: Safety/medical concerns        Walk 50 feet activity   Assist Walk 50 feet with 2 turns activity did not occur: Safety/medical concerns         Walk 150 feet activity   Assist Walk 150 feet activity did not occur: Safety/medical concerns         Walk 10 feet on uneven surface  activity   Assist Walk 10 feet on uneven surfaces activity did not occur: Safety/medical concerns         Wheelchair     Assist Will patient use wheelchair at discharge?: Yes Type of Wheelchair: Manual    Wheelchair assist level: Moderate Assistance - Patient 50 - 74% Max wheelchair distance: 150'    Wheelchair 50 feet with 2 turns activity    Assist        Assist Level: Minimal Assistance - Patient > 75%   Wheelchair 150 feet activity     Assist      Assist Level: Moderate Assistance - Patient 50 - 74%   Blood pressure 111/78, pulse 88, temperature 98.3 F (36.8 C), temperature source Oral, resp. rate 18, height 6' (1.829 m), weight 121 kg, SpO2 90 %.    Medical Problem List and Plan: 1.  Limitations with mobility, transfers, endurance, self-care secondary to left BKA.             -patient may not shower             -ELOS/Goals: 12-16 days/supervision/min a             Continue CIR  2.  Antithrombotics: -DVT/anticoagulation:  Pharmaceutical: Heparin             -antiplatelet therapy: ASA daily  3. Pain Management: Oxycodone as needed --DC IV Dilaudid (was using once a day on average). Well controlled             Monitor with increased exertion. 4. Mood: LCSW to              -antipsychotic agents: N/a 5. Neuropsych: This patient is  capable of making decisions on his own behalf. 6. Skin/Wound Care: Daily dressing changes. Monitor wound for healing.  Vitamin C for healing.  Prosource added to promote wound healing.  7. Fluids/Electrolytes/Nutrition: Monitor I/O.  Modified/renal diet  with 1200 ccFR/day. Labs monitored with HD. 8.  T2DM with hyperglycemia:  Hgb A1c- 7.9.  Continue Lantus 20 units daily (On levemir 75 mg bid/Novolog 5 untis bid PTA- reports eats better at home). Well controlled.  Continue to monitor BS ac/hs and use SSI for elevated blood sugars.             Monitor with increased mobility 9.  ESRD: Schedule hemodialysis at the end of the day on TTS to help with tolerance of activity.  Metabolic bone disease managed with Hectorol and  Renvela. 10.  COPD: Respiratory status stable on Incruse daily. With congestion- CXR shows atelectasis. Encourage use of IS.  11.  Anemia of chronic disease: Serial CBCs with hemodialysis.  On IV Aranesp weekly with Wynonia Lawman twice daily. Hgb 8.1 on 8/11 12.  History of CAD: Carvedilol decreased due to hypotension --continue Coreg 3.125 mg bid with Lipitor and ASA.             Monitor with increased mobility.  LOS: 2 days A FACE TO FACE EVALUATION WAS PERFORMED  Lisha Vitale P Kenedee Molesky 05/08/2020, 4:20 PM

## 2020-05-08 NOTE — Progress Notes (Signed)
[facility] Progress Note   Subjective:   Patient seen on dialysis, tolerating procedure well with soft BP. Denies SOB, CP, palpitations, dizziness, abdominal pain, N/V/D.   Objective Vitals:   05/07/20 1317 05/07/20 2005 05/08/20 0442 05/08/20 0807  BP: 96/71 102/77 100/70   Pulse: 91 87 85   Resp: 18 20 20    Temp: 98.5 F (36.9 C) 98 F (36.7 C) 100 F (37.8 C)   TempSrc: Oral Oral Oral   SpO2: 94% 94% 94% 95%  Weight:      Height:       Physical Exam General:Well appearing man, sleeping but awakens to voice, NAD Heart:RRR; no murmur Lungs:CTAB Abdomen:soft, non-tender, non-distended, +BS Extremities:No RLE edema; L BKA bandaged Dialysis Access:TDC in L chest (chronic)   Additional Objective Labs: Basic Metabolic Panel: Recent Labs  Lab 05/03/20 0411 05/03/20 0411 05/04/20 0521 05/04/20 1001 05/05/20 0338 05/06/20 0050  NA 134*   < > 132*  --  133* 134*  K 6.1*   < > 4.6  --  4.6 4.1  CL 92*   < > 93*  --  95* 94*  CO2 23   < > 24  --  23 27  GLUCOSE 226*   < > 162*  --  112* 97  BUN 70*   < > 39*  --  49* 25*  CREATININE 11.64*   < > 8.26*  --  10.30* 7.19*  CALCIUM 8.3*   < > 8.1*  --  8.2* 8.7*  PHOS 7.1*  --   --  6.1*  --   --    < > = values in this interval not displayed.   Liver Function Tests: Recent Labs  Lab 05/04/20 0521 05/05/20 0338 05/06/20 0050  AST 36 41 42*  ALT 21 17 18   ALKPHOS 74 75 80  BILITOT 0.6 0.4 0.6  PROT 7.2 7.4 7.4  ALBUMIN 2.1* 2.1* 2.2*   CBC: Recent Labs  Lab 05/02/20 0345 05/02/20 0345 05/02/20 1644 05/03/20 0411 05/04/20 0521  WBC 16.2*  --   --  13.1* 12.0*  HGB 8.5*   < > 7.9* 7.9* 8.1*  HCT 27.4*   < > 26.1* 25.6* 27.8*  MCV 87.5  --   --  87.7 88.8  PLT 365  --   --  342 386   < > = values in this interval not displayed.   Blood Culture    Component Value Date/Time   SDES BLOOD RIGHT FOREARM 04/28/2020 1517   SPECREQUEST  04/28/2020 1517    BOTTLES DRAWN AEROBIC AND  ANAEROBIC Blood Culture results may not be optimal due to an inadequate volume of blood received in culture bottles   CULT  04/28/2020 1517    NO GROWTH 5 DAYS Performed at [facility], [place] [address], [place], [place] [address]    REPTSTATUS 05/03/2020 FINAL 04/28/2020 1517   CBG: Recent Labs  Lab 05/07/20 0619 05/07/20 1139 05/07/20 1640 05/07/20 2125 05/08/20 0641  GLUCAP 143* 129* 111* 115* 104*    Studies/Results: DG CHEST PORT 1 VIEW  Result Date: 05/07/2020 CLINICAL DATA:  Congestion of respiratory tract. EXAM: PORTABLE CHEST 1 VIEW COMPARISON:  April 04, 2018 FINDINGS: Multiple sternal wires are seen. There is stable left-sided venous catheter positioning. Mild atelectasis and/or scarring is again seen within the left lung base. There is no evidence of a pleural effusion or pneumothorax. The cardiac silhouette is borderline in size. The visualized skeletal structures are unremarkable.  IMPRESSION: Mild left basilar atelectasis and/or scarring. Electronically Signed   By: Virgina Norfolk M.D.   On: 05/07/2020 16:50   Medications:  . heparin sodium (porcine)      . vitamin C  1,000 mg Oral BID  . aspirin  81 mg Oral QODAY  . atorvastatin  40 mg Oral QHS  . carvedilol  3.125 mg Oral BID WC  . cetaphil  1 application Topical Daily  . Chlorhexidine Gluconate Cloth  6 each Topical Q0600  . cinacalcet  90 mg Oral Q supper  . [START ON 05/12/2020] darbepoetin (ARANESP) injection - DIALYSIS  100 mcg Intravenous Q Thu-HD  . doxercalciferol  6 mcg Intravenous Q T,Th,Sa-HD  . ferrous gluconate  324 mg Oral BID WC  . heparin  5,000 Units Subcutaneous Q8H  . insulin aspart  0-15 Units Subcutaneous TID WC  . insulin glargine  20 Units Subcutaneous QHS  . melatonin  5 mg Oral QHS  . menthol-cetylpyridinium  1 lozenge Oral Daily  . multivitamin  1 tablet Oral QHS  . pantoprazole  40 mg Oral Daily  . senna-docusate  2 tablet Oral QHS  . sevelamer carbonate  4.8 g Oral TID WC   . umeclidinium bromide  1 puff Inhalation Daily    Dialysis Orders: TTS @ [facility] 4.5h 400/800 126kg 2/2.25 bath Hep 12000bolusTDC -Mircera68mcg q 2wks - just reordered/not yet given,lastdose1/26/21 -Hectorol87mcg IV qHD  Assessment/Plan: 1. PAD with L leg ischemia + gangrene:X-ray w/o signs of osteomyelitis, on empiricVanc + Zosyn.VVS consulted-> underwent BLE arteriogram 8/6showingsevere bilateralPAD below the knees. 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 Donnetta Hutching. In CIR 2. ESRD: Tolerating HD well today, continue TTS schedule.  3. Hypotension/volume: Chronic low BP - Coreg reduced. No volume overload on exam. EDW will be lower on discharge. 4. AnemiaofESRD: Last Hgb8.1, CBC pending today.Aranesp 136mcg q Thurs.No IV iron due to allergy- started on PO iron BID instead (tsat 6%, ferritin 218) 5. Secondary Hyperparathyroidism:CorrCa ok, Phos up slightly.Continue VDRA, sensipar,and binders.RFP pending today. 6. Constipation: Givenmag citrate + POdulcolax 8/8. 7. Nutrition- Renal diet w/fluid restrictions 8. DMT2- Per primary 9. CAD s/p CABG - Per primary 10. Hx CVA 11. COPD  Anice Paganini, PA-C 05/08/2020, 11:52 AM  [facility] Pager: [phone]

## 2020-05-08 NOTE — Plan of Care (Signed)
  Problem: Consults Goal: RH LIMB LOSS PATIENT EDUCATION Description: Description: See Patient Education module for eduction specifics. Outcome: Progressing   Problem: RH BOWEL ELIMINATION Goal: RH STG MANAGE BOWEL WITH ASSISTANCE Description: STG Manage Bowel with mod I Assistance. Outcome: Progressing Goal: RH STG MANAGE BOWEL W/MEDICATION W/ASSISTANCE Description: STG Manage Bowel with Medication with mod I Assistance. Outcome: Progressing   Problem: RH SKIN INTEGRITY Goal: RH STG ABLE TO PERFORM INCISION/WOUND CARE W/ASSISTANCE Description: STG Able To Perform Incision/Wound Care With mod I Assistance. Outcome: Progressing   Problem: RH SAFETY Goal: RH STG ADHERE TO SAFETY PRECAUTIONS W/ASSISTANCE/DEVICE Description: STG Adhere to Safety Precautions With cues and reminders  Outcome: Progressing   Problem: RH PAIN MANAGEMENT Goal: RH STG PAIN MANAGED AT OR BELOW PT'S PAIN GOAL Description: Pain level less than 5 on scale of 0-10 Outcome: Progressing   Problem: RH KNOWLEDGE DEFICIT LIMB LOSS Goal: RH STG INCREASE KNOWLEDGE OF SELF CARE AFTER LIMB LOSS Description: Pt will be able to adhere to medication regimen, dietary and lifestyle modification to prevent complications related to DM and ESRD with mod I assist using handouts and booklets provided.  Pt will be able to perform dressing changes and wrap stump site with mod I assist.  Outcome: Progressing

## 2020-05-08 NOTE — Progress Notes (Signed)
Patient had a small bowel movement last night. Rn offered enema but patient refused. Patient took senna 2 tablets that was scheduled. RN asked patient again this morning if he wants enema or suppository but patient refused. Denies abdominal pain or constipation.

## 2020-05-09 ENCOUNTER — Inpatient Hospital Stay (HOSPITAL_COMMUNITY): Payer: No Typology Code available for payment source | Admitting: Occupational Therapy

## 2020-05-09 ENCOUNTER — Inpatient Hospital Stay (HOSPITAL_COMMUNITY): Payer: No Typology Code available for payment source

## 2020-05-09 DIAGNOSIS — Z89512 Acquired absence of left leg below knee: Secondary | ICD-10-CM | POA: Diagnosis not present

## 2020-05-09 LAB — GLUCOSE, CAPILLARY
Glucose-Capillary: 118 mg/dL — ABNORMAL HIGH (ref 70–99)
Glucose-Capillary: 123 mg/dL — ABNORMAL HIGH (ref 70–99)
Glucose-Capillary: 107 mg/dL — ABNORMAL HIGH (ref 70–99)
Glucose-Capillary: 100 mg/dL — ABNORMAL HIGH (ref 70–99)

## 2020-05-09 NOTE — Progress Notes (Signed)
Physical Therapy Session Note  Patient Details  Name: Jonathon Bailey MRN: [id number] Date of Birth: January 19, 1956  Today's Date: 05/09/2020 PT Individual Time: 1358-1500 PT Individual Time Calculation (min): 62 min   Short Term Goals: Week 1:  PT Short Term Goal 1 (Week 1): pt to demonstrate supine<>sit at min A x1 PT Short Term Goal 2 (Week 1): pt to demonstrate transfers with LRAD at min A x1 PT Short Term Goal 4 (Week 1): pt to initiate standing with RW PT Short Term Goal 5 (Week 1): pt to mobilize WC 150' min A  Skilled Therapeutic Interventions/Progress Updates:   Received pt sitting in WC, pt agreeable to therapy, and reported pain 6/10 in L residual limb (premedicated). Repositioning, rest breaks, and distraction done to reduce pain levels. Session with emphasis on functional mobility/transfers, generalized strengthening, dynamic sitting balance/coordination, amputee education, and improved activity tolerance. Pt sitting with limb un-wrapped. Noted pt with bloody/clear drainage. PT cleaned incision and wrapped L residual limb with abdominal pad, gauze, and 2 ace wraps and total A. Donned second gown min A and pt performed WC mobility 61ft with bilateral UEs and supervision and transported to ortho gym in [facility] total A remainder of way for energy conservation purposes. Pt performed bilateral UE strengthening on UBE at level 2 for 3 minutes forward and 3 minutes backwards with supervision for improved cardiovascular endurance. Noted pt sliding forward in current WC despite scooting hips backward and noted pt's WC in "dumped" position. Switched out [address] for one with higher back for safety and improved comfort. Pt transferred WC<>mat lateral scoot min A +2 with cues for anterior weight shifting and hand placement on mat and WC. While seated on mat, pt performed the following exercises sitting with bilateral UE support and close supervision and verbal cues for technique: -hip flexion x12 on R LE with  2lb ankle weight -knee extension x12 on R LE with 2lb ankle weight and 2x12 on L LE un-weighted -tricep extensions on yoga block 2x8 -single arm bicep curls with 7lb dumbbell x20 bilaterally Educated pt on desensitization strategies and importance of maintaining L LE knee extension to avoid contractures and for future prosthetic fit. Pt transferred mat<>WC via lateral scoot with min A +2 with same cues mentioned above and therapist stabilizing WC for safety. Pt performed WC mobility additional 48ft using bilateral UEs and supervision and transported remainder of way to room in [facility] total A. Concluded session with pt sitting in WC, needs within reach, and seatbelt alarm on.   Therapy Documentation Precautions:  Precautions Precautions: Fall Precaution Comments: recent L BKA Restrictions Weight Bearing Restrictions: Yes LLE Weight Bearing: Non weight bearing   Therapy/Group: Individual Therapy Alfonse Alpers PT, DPT   05/09/2020, 7:19 AM

## 2020-05-09 NOTE — Progress Notes (Signed)
[facility] Progress Note   Subjective:   Patient seen bedside, no acute events. Tolerated hd 8/15  Objective Vitals:   05/08/20 1611 05/08/20 1955 05/09/20 0531 05/09/20 0759  BP: 111/78 (!) 102/42 (!) 101/54   Pulse: 88 86 63   Resp: 18 18 19    Temp:  99.5 F (37.5 C) 99.1 F (37.3 C)   TempSrc:  Oral Oral   SpO2: 90% 92% 91% 90%  Weight:   121.2 kg   Height:       Physical Exam General:NAD Heart:RRR; no murmur Lungs:CTAB, unlabored, bl chest expansion Abdomen:soft, non-tender, non-distended, +BS Extremities:No RLE edema; L BKA bandaged Dialysis Access:TDC in L chest (chronic): c/d/i   Additional Objective Labs: Basic Metabolic Panel: Recent Labs  Lab 05/03/20 0411 05/04/20 0521 05/04/20 1001 05/05/20 0338 05/06/20 0050 05/08/20 1149  NA 134*   < >  --  133* 134* 131*  K 6.1*   < >  --  4.6 4.1 4.9  CL 92*   < >  --  95* 94* 92*  CO2 23   < >  --  23 27 23   GLUCOSE 226*   < >  --  112* 97 123*  BUN 70*   < >  --  49* 25* 53*  CREATININE 11.64*   < >  --  10.30* 7.19* 12.34*  CALCIUM 8.3*   < >  --  8.2* 8.7* 8.1*  PHOS 7.1*  --  6.1*  --   --  6.3*   < > = values in this interval not displayed.   Liver Function Tests: Recent Labs  Lab 05/04/20 0521 05/04/20 0521 05/05/20 0338 05/06/20 0050 05/08/20 1149  AST 36  --  41 42*  --   ALT 21  --  17 18  --   ALKPHOS 74  --  75 80  --   BILITOT 0.6  --  0.4 0.6  --   PROT 7.2  --  7.4 7.4  --   ALBUMIN 2.1*   < > 2.1* 2.2* 2.1*   < > = values in this interval not displayed.   CBC: Recent Labs  Lab 05/03/20 0411 05/04/20 0521 05/08/20 1148  WBC 13.1* 12.0* 13.7*  HGB 7.9* 8.1* 7.4*  HCT 25.6* 27.8* 25.3*  MCV 87.7 88.8 90.0  PLT 342 386 478*   Blood Culture    Component Value Date/Time   SDES BLOOD RIGHT FOREARM 04/28/2020 1517   SPECREQUEST  04/28/2020 1517    BOTTLES DRAWN AEROBIC AND ANAEROBIC Blood Culture results may not be optimal due to an inadequate volume of  blood received in culture bottles   CULT  04/28/2020 1517    NO GROWTH 5 DAYS Performed at [place] [facility] [address] [place], [place] [address]    REPTSTATUS 05/03/2020 FINAL 04/28/2020 1517   CBG: Recent Labs  Lab 05/07/20 2125 05/08/20 0641 05/08/20 1707 05/08/20 2112 05/09/20 0602  GLUCAP 115* 104* 96 120* 118*    Studies/Results: DG CHEST PORT 1 VIEW  Result Date: 05/07/2020 CLINICAL DATA:  Congestion of respiratory tract. EXAM: PORTABLE CHEST 1 VIEW COMPARISON:  April 04, 2018 FINDINGS: Multiple sternal wires are seen. There is stable left-sided venous catheter positioning. Mild atelectasis and/or scarring is again seen within the left lung base. There is no evidence of a pleural effusion or pneumothorax. The cardiac silhouette is borderline in size. The visualized skeletal structures are unremarkable. IMPRESSION: Mild left basilar atelectasis and/or scarring. Electronically  Signed   By: Virgina Norfolk M.D.   On: 05/07/2020 16:50   Medications:  . vitamin C  1,000 mg Oral BID  . aspirin  81 mg Oral QODAY  . atorvastatin  40 mg Oral QHS  . carvedilol  3.125 mg Oral BID WC  . cetaphil  1 application Topical Daily  . Chlorhexidine Gluconate Cloth  6 each Topical Q0600  . cinacalcet  90 mg Oral Q supper  . [START ON 05/12/2020] darbepoetin (ARANESP) injection - DIALYSIS  100 mcg Intravenous Q Thu-HD  . doxercalciferol  6 mcg Intravenous Q T,Th,Sa-HD  . ferrous gluconate  324 mg Oral BID WC  . heparin  5,000 Units Subcutaneous Q8H  . insulin aspart  0-15 Units Subcutaneous TID WC  . insulin glargine  20 Units Subcutaneous QHS  . melatonin  5 mg Oral QHS  . menthol-cetylpyridinium  1 lozenge Oral Daily  . multivitamin  1 tablet Oral QHS  . pantoprazole  40 mg Oral Daily  . senna-docusate  2 tablet Oral QHS  . sevelamer carbonate  4.8 g Oral TID WC  . umeclidinium bromide  1 puff Inhalation Daily    Dialysis Orders: TTS @ [facility] 4.5h 400/800 126kg 2/2.25  bath Hep 12000bolusTDC -Mircera46mcg q 2wks - just reordered/not yet given,lastdose1/26/21 -Hectorol65mcg IV qHD  Assessment/Plan: 1. PAD with L leg ischemia + gangrene:X-ray w/o signs of osteomyelitis, on empiricVanc + Zosyn.VVS consulted-> underwent BLE arteriogram 8/6showingsevere bilateralPAD below the knees. 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 Donnetta Hutching. In CIR 2. ESRD: on TTS schedule, next HD 8/17 3. Hypotension/volume: Chronic low BP - Coreg reduced. No volume overload on exam. EDW will be lower on discharge. Will aim for 121kg per post treatment yesterday 4. AnemiaofESRD: Last Hgb8.1, CBC pending today.Aranesp 115mcg q Thurs.No IV iron due to allergy- started on PO iron BID instead (tsat 6%, ferritin 218) 5. Secondary Hyperparathyroidism:CorrCa ok, Phos up slightly.Continue VDRA, sensipar,and binders.RFP pending today. 6. Constipation: Givenmag citrate + POdulcolax 8/8. 7. Nutrition- Renal diet w/fluid restrictions 8. DMT2- Per primary 9. CAD s/p CABG - Per primary 10. Hx CVA 11. COPD  Gean Quint, MD [facility] 05/09/2020, 10:05 AM

## 2020-05-09 NOTE — Progress Notes (Signed)
Inpatient Rehabilitation  Patient information reviewed and entered into eRehab system by Daniah Zaldivar M. Shayda Kalka, M.A., CCC/SLP, PPS Coordinator.  Information including medical coding, functional ability and quality indicators will be reviewed and updated through discharge.    

## 2020-05-09 NOTE — Progress Notes (Signed)
Occupational Therapy Session Note  Patient Details  Name: Jonathon Bailey MRN: [id number] Date of Birth: 08-Jan-1956  Today's Date: 05/09/2020 OT Individual Time: 7591-6384 OT Individual Time Calculation (min): 69 min    Short Term Goals: Week 1:  OT Short Term Goal 1 (Week 1): Pt will trf with LRAD  to w/c in prep for toileting with MOD A OT Short Term Goal 2 (Week 1): Pt will thread BLE into pants with no more than CGA for sitting balance OT Short Term Goal 3 (Week 1): Pt will lean laterally EOB/EOM with S in prep for toileting  Skilled Therapeutic Interventions/Progress Updates:    Treatment session with focus on self-care retraining and activity tolerance.  Pt received supine in bed reporting fatigue and requiring encouragement to engage in therapy session.  Pt initially declining bathing and dressing, however ultimately agreeable.  Pt with no personal clothing and currently no disposable hospital pants in his size.  Therefore engaged in bathing from EOB with pt completing lateral leans to wash buttocks.  Pt required mod assist to come to sitting at EOB, but then ultimately able to complete all bathing with lateral leans with overall CGA to supervision.  Pt declined any standing this session, reporting fatigue and just wanting to return to supine to rest.  Frequent interruptions during session, therefore pt able to rest periodically throughout session.  Engaged in lateral scoots along EOB with supervision to improve positioning prior to returning to supine.  Pt remained semi-reclined in bed with all needs in reach.  Therapy Documentation Precautions:  Precautions Precautions: Fall Precaution Comments: recent L BKA Restrictions Weight Bearing Restrictions: Yes LLE Weight Bearing: Non weight bearing Pain:  Pt with no c/o pain   Therapy/Group: Individual Therapy  [name] 05/09/2020, 12:29 PM

## 2020-05-09 NOTE — Progress Notes (Signed)
[facility] PHYSICAL MEDICINE & REHABILITATION PROGRESS NOTE   Subjective/Complaints: No complaints this morning Has been using incentive spirometer Denies constipation Denies pain  ROS: +cough, congestion. Denies pain, constipation, insomnia.   Objective:   DG CHEST PORT 1 VIEW  Result Date: 05/07/2020 CLINICAL DATA:  Congestion of respiratory tract. EXAM: PORTABLE CHEST 1 VIEW COMPARISON:  April 04, 2018 FINDINGS: Multiple sternal wires are seen. There is stable left-sided venous catheter positioning. Mild atelectasis and/or scarring is again seen within the left lung base. There is no evidence of a pleural effusion or pneumothorax. The cardiac silhouette is borderline in size. The visualized skeletal structures are unremarkable. IMPRESSION: Mild left basilar atelectasis and/or scarring. Electronically Signed   By: Virgina Norfolk M.D.   On: 05/07/2020 16:50   Recent Labs    05/08/20 1148  WBC 13.7*  HGB 7.4*  HCT 25.3*  PLT 478*   Recent Labs    05/08/20 1149  NA 131*  K 4.9  CL 92*  CO2 23  GLUCOSE 123*  BUN 53*  CREATININE 12.34*  CALCIUM 8.1*    Intake/Output Summary (Last 24 hours) at 05/09/2020 0903 Last data filed at 05/09/2020 0854 Gross per 24 hour  Intake 500 ml  Output 1500 ml  Net -1000 ml     Physical Exam: Vital Signs Blood pressure (!) 101/54, pulse 63, temperature 99.1 F (37.3 C), temperature source Oral, resp. rate 19, height 6' (1.829 m), weight 121.2 kg, SpO2 90 %. General: Alert and oriented x 3, No apparent distress HEENT: Head is normocephalic, atraumatic, PERRLA, EOMI, sclera anicteric, oral mucosa pink and moist, dentition intact, ext ear canals clear,  Neck: Supple without JVD or lymphadenopathy Heart: Reg rate and rhythm. No murmurs rubs or gallops Chest: CTA bilaterally without wheezes, rales, or rhonchi; no distress Abdomen: Soft, non-tender, non-distended, bowel sounds positive. Extremities: No clubbing, cyanosis, or edema.  Pulses are 2+ Skin: Clean and intact without signs of breakdown Musculoskeletal:     Cervical back: Normal range of motion and neck supple.     Comments: Left BKA with edema and tenderness  Skin:    Comments: Left BKA with dressing CDI  Neurological:     Mental Status: He is alert and oriented to person, place, and time.     Comments: Alert Motor: Bilateral upper extremities: 5/5 proximal nose Left lower extremity: 4+/5 proximal distal Right lower extremity: Hip flexion 4/5 (pain inhibition) Limited extension of the right knee Sensation intact to light touch  Psychiatric:        Mood and Affect: Mood normal.        Behavior: Behavior normal.       Assessment/Plan: 1. Functional deficits secondary to left BKA which require 3+ hours per day of interdisciplinary therapy in a comprehensive inpatient rehab setting.  Physiatrist is providing close team supervision and 24 hour management of active medical problems listed below.  Physiatrist and rehab team continue to assess barriers to discharge/monitor patient progress toward functional and medical goals  Care Tool:  Bathing  Bathing activity did not occur: Refused           Bathing assist       Upper Body Dressing/Undressing Upper body dressing   What is the patient wearing?: Hospital gown only    Upper body assist Assist Level: Minimal Assistance - Patient > 75%    Lower Body Dressing/Undressing Lower body dressing      What is the patient wearing?: Pants     Lower  body assist Assist for lower body dressing: Maximal Assistance - Patient 25 - 49% (EOB)     Toileting Toileting Toileting Activity did not occur (Clothing management and hygiene only): N/A (no void or bm)  Toileting assist Assist for toileting: Maximal Assistance - Patient 25 - 49%     Transfers Chair/bed transfer  Transfers assist     Chair/bed transfer assist level: Total Assistance - Patient < 25%      Locomotion Ambulation   Ambulation assist   Ambulation activity did not occur: Safety/medical concerns          Walk 10 feet activity   Assist  Walk 10 feet activity did not occur: Safety/medical concerns        Walk 50 feet activity   Assist Walk 50 feet with 2 turns activity did not occur: Safety/medical concerns         Walk 150 feet activity   Assist Walk 150 feet activity did not occur: Safety/medical concerns         Walk 10 feet on uneven surface  activity   Assist Walk 10 feet on uneven surfaces activity did not occur: Safety/medical concerns         Wheelchair     Assist Will patient use wheelchair at discharge?: Yes Type of Wheelchair: Manual    Wheelchair assist level: Moderate Assistance - Patient 50 - 74% Max wheelchair distance: 150'    Wheelchair 50 feet with 2 turns activity    Assist        Assist Level: Minimal Assistance - Patient > 75%   Wheelchair 150 feet activity     Assist      Assist Level: Moderate Assistance - Patient 50 - 74%   Blood pressure (!) 101/54, pulse 63, temperature 99.1 F (37.3 C), temperature source Oral, resp. rate 19, height 6' (1.829 m), weight 121.2 kg, SpO2 90 %.    Medical Problem List and Plan: 1.  Limitations with mobility, transfers, endurance, self-care secondary to left BKA.             -patient may not shower             -ELOS/Goals: 12-16 days/supervision/min a             Continue CIR  2.  Antithrombotics: -DVT/anticoagulation:  Pharmaceutical: Heparin             -antiplatelet therapy: ASA daily  3. Pain Management: Oxycodone as needed --DC IV Dilaudid (was using once a day on average). Well controlled, wants to keep oxycodone every 4 hours for now.              Monitor with increased exertion. 4. Mood: LCSW to              -antipsychotic agents: N/a 5. Neuropsych: This patient is  capable of making decisions on his own behalf. 6. Skin/Wound Care: Daily  dressing changes. Monitor wound for healing.  Vitamin C for healing.  Prosource added to promote wound healing.  7. Fluids/Electrolytes/Nutrition: Monitor I/O.  Modified/renal diet with 1200 ccFR/day. Labs monitored with HD. 8.  T2DM with hyperglycemia:  Hgb A1c- 7.9.  Continue Lantus 20 units daily (On levemir 75 mg bid/Novolog 5 untis bid PTA- reports eats better at home). Well controlled. Continue to monitor BS ac/hs and use SSI for elevated blood sugars.             Monitor with increased mobility 9.  ESRD: Schedule hemodialysis at the end of  the day on TTS to help with tolerance of activity.  Metabolic bone disease managed with Hectorol and Renvela. 10.  COPD: Respiratory status stable on Incruse daily. With congestion- CXR shows atelectasis. Encourage use of IS.  11.  Anemia of chronic disease: Serial CBCs with hemodialysis.  On IV Aranesp weekly with Wynonia Lawman twice daily. Hgb 8.1 on 8/11, 7.4 on 8/16 12.  History of CAD: Carvedilol decreased due to hypotension --continue Coreg 3.125 mg bid with Lipitor and ASA.             Monitor with increased mobility.  LOS: 3 days A FACE TO FACE EVALUATION WAS PERFORMED  Leyland Kenna P Darius Fillingim 05/09/2020, 9:03 AM

## 2020-05-09 NOTE — Progress Notes (Signed)
Occupational Therapy Session Note  Patient Details  Name: Jonathon Bailey MRN: [id number] Date of Birth: [birth year]  Today's Date: 05/09/2020 OT Individual Time: 1100-1158 OT Individual Time Calculation (min): 58 min    Short Term Goals: Week 1:  OT Short Term Goal 1 (Week 1): Pt will trf with LRAD  to w/c in prep for toileting with MOD A OT Short Term Goal 2 (Week 1): Pt will thread BLE into pants with no more than CGA for sitting balance OT Short Term Goal 3 (Week 1): Pt will lean laterally EOB/EOM with S in prep for toileting  Skilled Therapeutic Interventions/Progress Updates:    patient asleep but aroused with stimulation.  He states that he is tired but willing to participate.  Rolling left and right in bed with CS using rails and increased time.  Donned incontinence brief max A due to no clothing available at this time.  Supine to sitting edge of bed with min A, HOB elevated.  He tolerated unsupported sitting with trunk activities for 15 minutes.  SB transfer bed to w/c with min A, cues for technique.  Reviewed pain control techniques and discussed what to expect post rehab.  He became tearful when talking about losing his leg.  Completed w/c push ups with cues.  He remained seated in w/c at close of session. Seat belt alarm set, call bell and tray table in reach.    Therapy Documentation Precautions:  Precautions Precautions: Fall Precaution Comments: recent L BKA Restrictions Weight Bearing Restrictions: Yes LLE Weight Bearing: Non weight bearing   Therapy/Group: Individual Therapy  Carlos Levering 05/09/2020, 7:40 AM

## 2020-05-09 NOTE — Progress Notes (Signed)
[facility] Individual Statement of Services  Patient Name:  Jonathon Bailey  Date:  05/09/2020  Welcome to the [facility].  Our goal is to provide you with an individualized program based on your diagnosis and situation, designed to meet your specific needs.  With this comprehensive rehabilitation program, you will be expected to participate in at least 3 hours of rehabilitation therapies Monday-Friday, with modified therapy programming on the weekends.  Your rehabilitation program will include the following services:  Physical Therapy (PT), Occupational Therapy (OT), 24 hour per day rehabilitation nursing, Neuropsychology, Care Coordinator, Rehabilitation Medicine, Nutrition Services and Pharmacy Services  Weekly team conferences will be held on Wednesday to discuss your progress.  Your Inpatient Rehabilitation Care Coordinator will talk with you frequently to get your input and to update you on team discussions.  Team conferences with you and your family in attendance may also be held.  Expected length of stay: 14-16 days  Overall anticipated outcome: supervision with cues-wheelchair level  Depending on your progress and recovery, your program may change. Your Inpatient Rehabilitation Care Coordinator will coordinate services and will keep you informed of any changes. Your Inpatient Rehabilitation Care Coordinator's name and contact numbers are listed  below.  The following services may also be recommended but are not provided by the [facility] will be made to provide these services after discharge if needed.  Arrangements include referral to agencies that provide these services.  Your insurance has been verified to be:  medicare Your primary doctor is:  [employer]  Pertinent information will be shared  with your doctor and your insurance company.  Inpatient Rehabilitation Care Coordinator:  Ovidio Kin, [place]  Information discussed with and copy given to patient by: Elease Hashimoto, 05/09/2020, 1:50 PM

## 2020-05-09 NOTE — Progress Notes (Signed)
Patient Details  Name: Jonathon Bailey MRN: [id number] Date of Birth: Dec 15, 1955  Today's Date: 05/09/2020  Hospital Problems: Principal Problem:   S/P BKA (below knee amputation) unilateral, left [facility])  Past Medical History:  Past Medical History:  Diagnosis Date  . Anxiety   . Arthritis   . Asthma    "as a child"  . CAD (coronary artery disease)   . Chronic systolic heart failure ([place]) 09/21/2008   ECHO Feb 2013 showed LVEF low normal at 50-55%, +hypokinetic anterolateral wall and inferolateral wall.    . CKD (chronic kidney disease) stage 4, GFR 15-29 ml/min (HCC) 08/30/2009   Progressive renal failure since 2008, creatinine 1.2 in 2008 up to 3.5 in 2012 and 3.2-5.0 in 2013. All UA's 2011-13 showed >300 protein on dipstick. Work-up in May 2011 showed negative Urine IFE and SPEP, ultrasound showed 12-13 cm kidneys with increased echogenicity and UPC ratio was 1.5 gm proteinuria.  Hgb A1C's from 2011 to 2013 were all between 9-11.  Patient saw Dr. Donnetta Hutching (vasc surgery) for HD access in Aug 2013 > vein mapping was done and Dr. Donnetta Hutching felt the left arm (pt is R handed) was suitable for L arm Cimino radiocephalic fistula. Patient said he wasn't ready to consider doing dialysis and declined the surgery.     . Depression   . Diabetic retinopathy ([place])   . ESRD (end stage renal disease) on dialysis ([place])   . KPTWSFKC(127.5)    "maybe monthly" (03/15/2014)  . Hyperlipidemia   . Hypertension   . Macular edema   . Myocardial infarction [facility])    status post MI x2 and 3 stents placed in 2003  . Obesity   . Sleep apnea    does not use CPAP  . Stroke [facility]) ~ 2007; ~1987   "weak on right side; messed w/right side of brain; cry all the time"  . Type II diabetes mellitus (HCC)    insulin dependent   Past Surgical History:  Past Surgical History:  Procedure Laterality Date  . ABDOMINAL AORTOGRAM W/LOWER EXTREMITY N/A 04/29/2020   Procedure: ABDOMINAL AORTOGRAM W/LOWER EXTREMITY;  Surgeon:  Angelia Mould, MD;  Location: [facility] CV LAB;  Service: Cardiovascular;  Laterality: N/A;  . AMPUTATION Left 05/02/2020   Procedure: AMPUTATION BELOW KNEE;  Surgeon: Rosetta Posner, MD;  Location: [facility];  Service: Vascular;  Laterality: Left;  . AV FISTULA PLACEMENT  10/17/2012   Procedure: ARTERIOVENOUS (AV) FISTULA CREATION;  Surgeon: Mal Misty, MD;  Location: [place];  Service: Vascular;  Laterality: Left;  . BONE EXOSTOSIS EXCISION Right 06/04/2019   Procedure: EXOSTOSIS EXCISION;  Surgeon: Leanora Cover, MD;  Location: [place];  Service: Orthopedics;  Laterality: Right;  . CAPD INSERTION N/A 03/15/2014   Procedure: LAPAROSCOPIC INSERTION CONTINUOUS AMBULATORY PERITONEAL DIALYSIS CATHETER, LAPARASCOPIC INCISIONAL HERNIA  REPAIR  WITH MESH, OMENTOPEXY AND LYSIS OF ADHESIONS;  Surgeon: [name], MD;  Location: [place];  Service: General;  Laterality: N/A;  . CORONARY ANGIOPLASTY WITH STENT PLACEMENT  ~ 2002   "3"  . CORONARY ANGIOPLASTY WITH STENT PLACEMENT  01/24/2004   successful PCI/stenting RCA  drug eluting cypher stent  . CORONARY ANGIOPLASTY WITH STENT PLACEMENT  01/28/2004   successful stentin of a large bifurcation marginal branch of the ramus intermediate vessel  . FLEXIBLE SIGMOIDOSCOPY N/A 09/05/2014   Procedure: FLEXIBLE SIGMOIDOSCOPY;  Surgeon: Cleotis Nipper, MD;  Location: [facility] ENDOSCOPY;  Service: Endoscopy;  Laterality: N/A;  . HERNIA REPAIR    .  INCISION AND DRAINAGE ABSCESS N/A 09/06/2014   Procedure: REMOVAL OF PD CATH;  Surgeon: Coralie Keens, MD;  Location: [place];  Service: General;  Laterality: N/A;  . INCISIONAL HERNIA REPAIR  03/15/2014  . LAPAROSCOPIC LYSIS OF ADHESIONS  03/15/2014  . NM MYOCAR PERF WALL MOTION  02/06/2012   normal perfusion scan  . PERITONEAL CATHETER INSERTION  03/15/2014  . REFRACTIVE SURGERY Bilateral   . SHUNTOGRAM N/A 03/11/2013   Procedure: Fistulogram;  Surgeon: Conrad Nice, MD;  [facility] CATH LAB;   Service: Cardiovascular;  Laterality: N/A;  . TENDON TRANSFER Right 06/04/2019   Procedure: RIGHT HAND EXTENSOR TENDON TRANSFER TO SMALL FINGER;  Surgeon: Leanora Cover, MD;  Location: [place];  Service: Orthopedics;  Laterality: Right;  . UMBILICAL HERNIA REPAIR     Social History:  reports that he quit smoking about 33 years ago. His smoking use included cigarettes. He has a 15.00 pack-year smoking history. He has never used smokeless tobacco. He reports previous alcohol use. He reports that he does not use drugs.  Family / Support Systems Marital Status: Divorced Patient Roles: Parent Children: Waunita Schooner [phone] Other Supports: Friends Anticipated Caregiver: Son Ability/Limitations of Caregiver: Son is in school and will be available after class and band practice. Caregiver Availability: Evenings only Family Dynamics: Close knit with son and friends he feels he has limited but good supports.  Social History Preferred language: English Religion: Baptist Cultural Background: No issues Education: HS Read: Yes Write: Yes Employment Status: Disabled Public relations account executive Issues: No issues Guardian/Conservator: None-according to MD pt is capable of making his own decisions while here   Abuse/Neglect Abuse/Neglect Assessment Can Be Completed: Yes Physical Abuse: Denies Verbal Abuse: Denies Sexual Abuse: Denies Exploitation of patient/patient's resources: Denies Self-Neglect: Denies  Emotional Status Pt's affect, behavior and adjustment status: Pt is able to explain his amputee he is still adjusting to this. He was not happy he had to have the surgery and wished he didn't have too. he would benefit from seeing neuro-psych while here Recent Psychosocial Issues: other health issues-ESRD drove self to HD Psychiatric History: No history would benefit from seeing neuro-psych while here for coping and adjustment Substance Abuse History: No issues  Patient  / Family Perceptions, Expectations & Goals Pt/Family understanding of illness & functional limitations: Pt is talking to MD and feels he has a good understanding of his amputation and healing process. He seems down and sad regarding his surgery. Premorbid pt/family roles/activities: Father, HD pt, retiree, friend, etc Anticipated changes in roles/activities/participation: resume Pt/family expectations/goals: Pt states: " I want to take care of myself before I leave here."  [employer]: Other (Comment) (HD pt T,TH Sat) Premorbid Home Care/DME Agencies: None Transportation available at discharge: Pt was driving himself to HD Resource referrals recommended: Neuropsychology, Support group (specify)  Discharge Planning Living Arrangements: Children Support Systems: Children, Friends/neighbors Type of Residence: Private residence Insurance Resources: Chartered certified accountant Resources: [employer] Financial Screen Referred: No Living Expenses: Rent Money Management: Patient Does the patient have any problems obtaining your medications?: No Home Management: Both he and son, mostly pt Patient/Family Preliminary Plans: Return home with son who is in college and plays in the band. He will need to be mod/i to be able to go home due to does not have 24 hr care. Care Coordinator Barriers to Discharge: Decreased caregiver support Care Coordinator Anticipated Follow Up Needs: HH/OP, Support Group  Clinical Impression Pleasant gentleman who is motivated to do well but  is depressed over losing his leg. He lives with college aged son who will assist, but pt does not want him to have too. Will ask neuro-psych to see and work on discharge needs.  Elease Hashimoto 05/09/2020, 1:48 PM

## 2020-05-09 NOTE — IPOC Note (Signed)
Overall Plan of Care [facility]) Patient Details Name: ZARIAN COLPITTS MRN: [id number] DOB: [birth year]  Admitting Diagnosis: S/P BKA (below knee amputation) unilateral, left [facility])  Hospital Problems: Principal Problem:   S/P BKA (below knee amputation) unilateral, left ([place])     Functional Problem List: Nursing Pain, Safety, Bowel, Endurance, Skin Integrity, Medication Management, Motor, Behavior  PT Balance, Skin Integrity, Endurance, Pain  OT Balance, Cognition, Edema, Endurance, Pain, Safety  SLP    TR         Basic ADL's: OT Grooming, Bathing, Dressing, Toileting     Advanced  ADL's: OT       Transfers: PT Bed Mobility, Bed to Chair, Car  OT Toilet, Tub/Shower     Locomotion: PT Wheelchair Mobility     Additional Impairments: OT    SLP        TR      Anticipated Outcomes Item Anticipated Outcome  Self Feeding S  Swallowing      Basic self-care  S  Toileting  S   Bathroom Transfers S  Bowel/Bladder  manage bowel/bladder w mod-I assist  Transfers  supervision  Locomotion  supervision with WC  Communication     Cognition     Pain  pain level < 5  Safety/Judgment  remain free of injury, prevent falls w cues and reminders   Therapy Plan: PT Intensity: Minimum of 1-2 x/day ,45 to 90 minutes PT Frequency: 5 out of 7 days PT Duration Estimated Length of Stay: 20 days-25days OT Intensity: Minimum of 1-2 x/day, 45 to 90 minutes OT Frequency: 5 out of 7 days OT Duration/Estimated Length of Stay: 2.5-3.5 weeks     Due to the current state of emergency, patients may not be receiving their 3-hours of Medicare-mandated therapy.   Team Interventions: Nursing Interventions Patient/Family Education, Skin Care/Wound Management, Pain Management, Psychosocial Support, Bowel Management, Medication Management, Discharge Planning  PT interventions Community reintegration, Discharge planning, Disease management/prevention, Training and development officer,  DME/adaptive equipment instruction, Functional mobility training, Neuromuscular re-education, Psychosocial support, UE/LE Strength taining/ROM, Wheelchair propulsion/positioning, UE/LE Coordination activities, Therapeutic Activities, Skin care/wound management, Pain management, Patient/family education, Splinting/orthotics, Therapeutic Exercise  OT Interventions Balance/vestibular training, DME/adaptive equipment instruction, Patient/family education, Therapeutic Activities, Wheelchair propulsion/positioning, Therapeutic Exercise, Psychosocial support, Cognitive remediation/compensation, Functional mobility training, Community reintegration, Self Care/advanced ADL retraining, UE/LE Strength taining/ROM, UE/LE Coordination activities, Skin care/wound managment, Neuromuscular re-education, Discharge planning, Disease mangement/prevention, Pain management, Splinting/orthotics  SLP Interventions    TR Interventions    SW/CM Interventions     Barriers to Discharge MD  Medical stability, Weight and Hemodialysis  Nursing Hemodialysis, Weight bearing restrictions    PT Decreased caregiver support, Weight bearing restrictions, Weight, Wound Care    OT Decreased caregiver support, Home environment access/layout, Weight bearing restrictions, Lack of/limited family support    SLP      SW       Team Discharge Planning: Destination: PT-Home ,OT- Home (unsure if family can provide the support needed at home) , SLP-  Projected Follow-up: PT-Home health PT, 24 hour supervision/assistance, OT-   , SLP-  Projected Equipment Needs: PT- , OT-  , SLP-  Equipment Details: PT-TBD, OT-  Patient/family involved in discharge planning: PT- Patient,  OT-Patient, SLP-   MD ELOS: 12-16 days S/MinA Medical Rehab Prognosis:  Good Assessment: Mr. Prins is a 64 year old man who was admitted to CIR with limitations with mobility, transfers, endurance, self-care secondary to left BKA. His pain has been well controlled with  Oxycodone as needed.  Has been transitioned off IV Dilaudid. He is on a renal diet with 1200cc fluid restriction. He has been getting hemodialysis T/Th/Sa. CBGs have been well controlled with Lantus. CXR was obtained for congestion and shows atelectasis- he has been encouraged to use incentive spirometer. Labs are being drawn with HD. Hgb trending downward. Receives weekly IV Aranesp and Fergon BID.    See Team Conference Notes for weekly updates to the plan of care

## 2020-05-10 ENCOUNTER — Inpatient Hospital Stay (HOSPITAL_COMMUNITY): Payer: Medicare Other

## 2020-05-10 ENCOUNTER — Inpatient Hospital Stay (HOSPITAL_COMMUNITY): Payer: No Typology Code available for payment source

## 2020-05-10 ENCOUNTER — Inpatient Hospital Stay (HOSPITAL_COMMUNITY): Payer: No Typology Code available for payment source | Admitting: Occupational Therapy

## 2020-05-10 DIAGNOSIS — D72829 Elevated white blood cell count, unspecified: Secondary | ICD-10-CM | POA: Diagnosis not present

## 2020-05-10 DIAGNOSIS — E1165 Type 2 diabetes mellitus with hyperglycemia: Secondary | ICD-10-CM

## 2020-05-10 DIAGNOSIS — R0989 Other specified symptoms and signs involving the circulatory and respiratory systems: Secondary | ICD-10-CM

## 2020-05-10 DIAGNOSIS — G8918 Other acute postprocedural pain: Secondary | ICD-10-CM

## 2020-05-10 DIAGNOSIS — Z89512 Acquired absence of left leg below knee: Secondary | ICD-10-CM | POA: Diagnosis not present

## 2020-05-10 DIAGNOSIS — I10 Essential (primary) hypertension: Secondary | ICD-10-CM | POA: Diagnosis not present

## 2020-05-10 DIAGNOSIS — D638 Anemia in other chronic diseases classified elsewhere: Secondary | ICD-10-CM

## 2020-05-10 DIAGNOSIS — Z794 Long term (current) use of insulin: Secondary | ICD-10-CM

## 2020-05-10 LAB — CBC
HCT: 25 % — ABNORMAL LOW (ref 39.0–52.0)
Hemoglobin: 7.1 g/dL — ABNORMAL LOW (ref 13.0–17.0)
MCH: 26 pg (ref 26.0–34.0)
MCHC: 28.4 g/dL — ABNORMAL LOW (ref 30.0–36.0)
MCV: 91.6 fL (ref 80.0–100.0)
Platelets: 447 10*3/uL — ABNORMAL HIGH (ref 150–400)
RBC: 2.73 MIL/uL — ABNORMAL LOW (ref 4.22–5.81)
RDW: 17.7 % — ABNORMAL HIGH (ref 11.5–15.5)
WBC: 12.9 10*3/uL — ABNORMAL HIGH (ref 4.0–10.5)
nRBC: 0.6 % — ABNORMAL HIGH (ref 0.0–0.2)

## 2020-05-10 LAB — RENAL FUNCTION PANEL
Albumin: 2.2 g/dL — ABNORMAL LOW (ref 3.5–5.0)
Anion gap: 16 — ABNORMAL HIGH (ref 5–15)
BUN: 46 mg/dL — ABNORMAL HIGH (ref 8–23)
CO2: 25 mmol/L (ref 22–32)
Calcium: 8 mg/dL — ABNORMAL LOW (ref 8.9–10.3)
Chloride: 93 mmol/L — ABNORMAL LOW (ref 98–111)
Creatinine, Ser: 10.71 mg/dL — ABNORMAL HIGH (ref 0.61–1.24)
GFR calc Af Amer: 5 mL/min — ABNORMAL LOW (ref >60)
GFR calc non Af Amer: 5 mL/min — ABNORMAL LOW (ref >60)
Glucose, Bld: 130 mg/dL — ABNORMAL HIGH (ref 70–99)
Phosphorus: 3.8 mg/dL (ref 2.5–4.6)
Potassium: 4.3 mmol/L (ref 3.5–5.1)
Sodium: 134 mmol/L — ABNORMAL LOW (ref 135–145)

## 2020-05-10 LAB — GLUCOSE, CAPILLARY
Glucose-Capillary: 139 mg/dL — ABNORMAL HIGH (ref 70–99)
Glucose-Capillary: 120 mg/dL — ABNORMAL HIGH (ref 70–99)
Glucose-Capillary: 90 mg/dL (ref 70–99)
Glucose-Capillary: 98 mg/dL (ref 70–99)

## 2020-05-10 MED ORDER — EXERCISE FOR HEART AND HEALTH BOOK
Freq: Once | Status: AC
  Filled 2020-05-10: qty 1

## 2020-05-10 MED ORDER — DOXERCALCIFEROL 4 MCG/2ML IV SOLN
INTRAVENOUS | Status: AC
  Administered 2020-05-10: 6 ug via INTRAVENOUS
  Filled 2020-05-10: qty 4

## 2020-05-10 MED ORDER — SORBITOL 70 % SOLN
30.0000 mL | Freq: Every day | Status: DC | PRN
  Administered 2020-05-12: 30 mL via ORAL
  Filled 2020-05-10 (×2): qty 30

## 2020-05-10 MED ORDER — HEPARIN SODIUM (PORCINE) 1000 UNIT/ML IJ SOLN
INTRAMUSCULAR | Status: AC
  Administered 2020-05-10: 4200 [IU]
  Filled 2020-05-10: qty 4

## 2020-05-10 MED ORDER — KIDNEY FAILURE BOOK
Freq: Once | Status: AC
  Filled 2020-05-10: qty 1

## 2020-05-10 MED ORDER — POLYETHYLENE GLYCOL 3350 17 G PO PACK
17.0000 g | PACK | Freq: Two times a day (BID) | ORAL | Status: DC
  Administered 2020-05-11 – 2020-05-26 (×14): 17 g via ORAL
  Filled 2020-05-10 (×28): qty 1

## 2020-05-10 NOTE — Progress Notes (Signed)
Occupational Therapy Session Note  Patient Details  Name: Jonathon Bailey MRN: [id number] Date of Birth: April 13, 1956  Today's Date: 05/10/2020 OT Individual Time: 6283-1517 OT Individual Time Calculation (min): 70 min    Short Term Goals: Week 1:  OT Short Term Goal 1 (Week 1): Pt will trf with LRAD  to w/c in prep for toileting with MOD A OT Short Term Goal 2 (Week 1): Pt will thread BLE into pants with no more than CGA for sitting balance OT Short Term Goal 3 (Week 1): Pt will lean laterally EOB/EOM with S in prep for toileting  Skilled Therapeutic Interventions/Progress Updates:  Patient met lying supine in bed in agreement with OT treatment session with focus on self-care re-education, functional transfers, lateral leans in prep for bathing/dressing/toileting, and activity tolerance as detailed below. Patient completed supine to EOB transfer with Mod A. UB/LB bathing in sitting with lateral leans to wash buttocks. Min A for UB dressing to don hospital gown and Max A to don brief and pants in sitting/standing with RW. Slide board transfer to wc on R with Min A and cues for hand placement, head/hip relationship and power negotiation. Seated in wc at sink level, patient completed 2/3 grooming tasks with set-up assist. L residual limb wrapped and elevated. Session concluded with patient in wc with call bell within reach, belt alarm activated, and all needs met.   Therapy Documentation Precautions:  Precautions Precautions: Fall Precaution Comments: recent L BKA Restrictions Weight Bearing Restrictions: Yes LLE Weight Bearing: Non weight bearing General:    Therapy/Group: Individual Therapy  Tamyra Fojtik R [place] 05/10/2020, 7:24 AM

## 2020-05-10 NOTE — Progress Notes (Signed)
Physical Therapy Session Note  Patient Details  Name: Jonathon Bailey MRN: [id number] Date of Birth: November 08, 1955  Today's Date: 05/10/2020 PT Individual Time: 2703-5009 and 3818-2993  PT Individual Time Calculation (min): 55 min and 61 min  Short Term Goals: Week 1:  PT Short Term Goal 1 (Week 1): pt to demonstrate supine<>sit at min A x1 PT Short Term Goal 2 (Week 1): pt to demonstrate transfers with LRAD at min A x1 PT Short Term Goal 4 (Week 1): pt to initiate standing with RW PT Short Term Goal 5 (Week 1): pt to mobilize WC 150' min A  Skilled Therapeutic Interventions/Progress Updates:   Treatment Session 1: 0900-0955 55 min Received pt sitting in WC, pt agreeable to therapy, and denied any pain at rest.  Session with emphasis on functional mobility/transfers, generalized strengthening, dynamic sitting/standing balance/coordination, and improved activity tolerance. Pt performed WC mobility 121ft using bilateral UEs and supervision to dayroom and transferred WC<>mat via lateral scoot with min A of 1 with cues for hand placement, anterior weight shifting and overall technique. Pt required multiple attempts to stand and therapist ultimately raised mat to significant height and pt transferred sit<>stand with RW x 1 trial with mod A +2 and x 1 trial with mod/max A of 1 with cues for hand placement on mat and RW. Pt able to remain standing for the following time frames with min A: Trial 1: 40 seconds  Trial 2: 65 seconds Pt transferred sit<>stand from elevated mat mod A and worked on dynamic standing balance playing cornhole x 2 trials with min/mod A for balance and 1 UE support on RW. Pt performed the following exercises sitting on mat with supervision and verbal cues for technique: -overhead chest press with 5lb dowel x20 and x11 with 8lb dowel -horizontal chest press at 90 degrees with 5lb dowel 1x15 and 1x20 Pt transferred mat<>WC via lateral scoot min A +2 to stabilize WC due to  un-sturdiness of brakes. Pt transported back to room in [facility] total A. Concluded session with pt sitting in WC, needs within reach, and seatbelt alarm on.   Treatment Session 2: 1058-1159 61 min Received pt sitting in WC, pt agreeable to therapy, and denied any pain during session. Session with emphasis on functional mobility/transfers, generalized strengthening, dynamic sitting balance/coordination, and improved activity tolerance. Pt transported to dayroom in [facility] total A for energy conservation purposes. Pt transferred WC<>mat via lateral scoot min A and sit<>supine with supervision and increased time. Pt performed the following exercises on mat with supervision and verbal cues for technique:  -SLR 2x8 on L LE and x5 on R LE (limited due to weakness) -marching 2x10 on R LE -hip abduction x8 on L LE and x12 on R LE -overhead shoulder flexion with 5lb dowel 2x10 -bench press with 9lb dowel 2x12 -R sidelying L hip abduction 2x7 -R sidelying L hip extension 2x5 Noted pt closing eyes frequently during session, especially with supine exercises and reported increased fatigue today and stated he did not sleep well last night. Pt transferred R sidelying<>sitting on mat with CGA. Pt tansferred mat<>WC via lateral scoot with min A and therapist stabilizing WC for safety. Pt transported to ortho gym in [facility] total A and performed bilateral UE strengthening on UBE at level 3 for 2 minutes forward and 2 minutes backwards with supervision. Pt transported back to room in [facility] total A and transferred WC<>bed via lateral scoot min A and sit<>supine with min A for R LE management. Concluded session  with pt supine in bed, needs within reach, and bed alarm on.   Therapy Documentation Precautions:  Precautions Precautions: Fall Precaution Comments: recent L BKA Restrictions Weight Bearing Restrictions: Yes LLE Weight Bearing: Non weight bearing  Therapy/Group: Individual Therapy Alfonse Alpers PT, DPT    05/10/2020, 7:28 AM

## 2020-05-10 NOTE — Progress Notes (Signed)
Pt being transported to Xray by transport

## 2020-05-10 NOTE — Plan of Care (Signed)
°  Problem: Consults Goal: RH LIMB LOSS PATIENT EDUCATION Description: Description: See Patient Education module for eduction specifics. Outcome: Progressing   Problem: RH BOWEL ELIMINATION Goal: RH STG MANAGE BOWEL WITH ASSISTANCE Description: STG Manage Bowel with mod I Assistance. Outcome: Progressing Goal: RH STG MANAGE BOWEL W/MEDICATION W/ASSISTANCE Description: STG Manage Bowel with Medication with mod I Assistance. Outcome: Progressing   Problem: RH SKIN INTEGRITY Goal: RH STG ABLE TO PERFORM INCISION/WOUND CARE W/ASSISTANCE Description: STG Able To Perform Incision/Wound Care With mod I Assistance. Outcome: Progressing   Problem: RH SAFETY Goal: RH STG ADHERE TO SAFETY PRECAUTIONS W/ASSISTANCE/DEVICE Description: STG Adhere to Safety Precautions With cues and reminders  Outcome: Progressing   Problem: RH PAIN MANAGEMENT Goal: RH STG PAIN MANAGED AT OR BELOW PT'S PAIN GOAL Description: Pain level less than 5 on scale of 0-10 Outcome: Progressing   Problem: RH KNOWLEDGE DEFICIT LIMB LOSS Goal: RH STG INCREASE KNOWLEDGE OF SELF CARE AFTER LIMB LOSS Description: Pt will be able to adhere to medication regimen, dietary and lifestyle modification to prevent complications related to DM and ESRD with mod I assist using handouts and booklets provided.  Pt will be able to perform dressing changes and wrap stump site with mod I assist.  Outcome: Progressing

## 2020-05-10 NOTE — Progress Notes (Addendum)
[facility] PHYSICAL MEDICINE & REHABILITATION PROGRESS NOTE   Subjective/Complaints: Patient seen sitting up in bed this morning.  He states he did not sleep well overnight because he "just did not".  ROS: Denies CP, SOB, N/V/D  Objective:   No results found. Recent Labs    05/08/20 1148  WBC 13.7*  HGB 7.4*  HCT 25.3*  PLT 478*   Recent Labs    05/08/20 1149  NA 131*  K 4.9  CL 92*  CO2 23  GLUCOSE 123*  BUN 53*  CREATININE 12.34*  CALCIUM 8.1*    Intake/Output Summary (Last 24 hours) at 05/10/2020 2952 Last data filed at 05/09/2020 1332 Gross per 24 hour  Intake 330 ml  Output --  Net 330 ml     Physical Exam: Vital Signs Blood pressure (!) 139/113, pulse 83, temperature 97.9 F (36.6 C), temperature source Oral, resp. rate 18, height 6' (1.829 m), weight 121.5 kg, SpO2 94 %. Constitutional: No distress . Vital signs reviewed. HENT: Normocephalic.  Atraumatic. Eyes: EOMI. No discharge. Cardiovascular: No JVD.  RRR. Respiratory: Normal effort.  No stridor.  Bilateral clear to auscultation. GI: Non-distended.  BS +. Skin: Left BKA with staples, central lateral area with mild serosanguineous drainage. Psych: Mildly annoyed. Musc: Left BKA with edema and tenderness Left knee extension improving Neuro: Alert Motor: Bilateral upper extremities: 5/5 proximal distal Left lower extremity: 4+/5 proximal distally Right lower extremity: Hip flexion 4/5 (pain inhibition)  Assessment/Plan: 1. Functional deficits secondary to left BKA which require 3+ hours per day of interdisciplinary therapy in a comprehensive inpatient rehab setting.  Physiatrist is providing close team supervision and 24 hour management of active medical problems listed below.  Physiatrist and rehab team continue to assess barriers to discharge/monitor patient progress toward functional and medical goals  Care Tool:  Bathing  Bathing activity did not occur: Refused Body parts bathed by  patient: Right arm, Left arm, Chest, Abdomen, Front perineal area, Buttocks, Right upper leg, Left upper leg, Face         Bathing assist Assist Level: Contact Guard/Touching assist     Upper Body Dressing/Undressing Upper body dressing   What is the patient wearing?: Hospital gown only    Upper body assist Assist Level: Minimal Assistance - Patient > 75%    Lower Body Dressing/Undressing Lower body dressing      What is the patient wearing?: Pants     Lower body assist Assist for lower body dressing: Maximal Assistance - Patient 25 - 49% (EOB)     Toileting Toileting Toileting Activity did not occur (Clothing management and hygiene only): N/A (no void or bm)  Toileting assist Assist for toileting: Maximal Assistance - Patient 25 - 49%     Transfers Chair/bed transfer  Transfers assist     Chair/bed transfer assist level: 2 Helpers     Locomotion Ambulation   Ambulation assist   Ambulation activity did not occur: Safety/medical concerns          Walk 10 feet activity   Assist  Walk 10 feet activity did not occur: Safety/medical concerns        Walk 50 feet activity   Assist Walk 50 feet with 2 turns activity did not occur: Safety/medical concerns         Walk 150 feet activity   Assist Walk 150 feet activity did not occur: Safety/medical concerns         Walk 10 feet on uneven surface  activity  Assist Walk 10 feet on uneven surfaces activity did not occur: Safety/medical concerns         Wheelchair     Assist Will patient use wheelchair at discharge?: Yes Type of Wheelchair: Manual    Wheelchair assist level: Moderate Assistance - Patient 50 - 74% Max wheelchair distance: 150'    Wheelchair 50 feet with 2 turns activity    Assist        Assist Level: Minimal Assistance - Patient > 75%   Wheelchair 150 feet activity     Assist      Assist Level: Moderate Assistance - Patient 50 - 74%   Blood  pressure (!) 139/113, pulse 83, temperature 97.9 F (36.6 C), temperature source Oral, resp. rate 18, height 6' (1.829 m), weight 121.5 kg, SpO2 94 %.    Medical Problem List and Plan: 1.  Limitations with mobility, transfers, endurance, self-care secondary to left BKA.  Continue CIR  Will consider shrinker 2.  Antithrombotics: -DVT/anticoagulation:  Pharmaceutical: Heparin             -antiplatelet therapy: ASA daily  3. Pain Management: Oxycodone as needed   Controlled with meds on 8/17             Monitor with increased exertion. 4. Mood: Team support             -antipsychotic agents: N/a 5. Neuropsych: This patient is capable of making decisions on his own behalf. 6. Skin/Wound Care: Daily dressing changes. Monitor wound for healing.  Vitamin C for healing.  Prosource added to promote wound healing.  7. Fluids/Electrolytes/Nutrition: Monitor I/O.  Modified/renal diet with 1200 ccFR/day. Labs monitored with HD. 8.  T2DM with hyperglycemia:  Hgb A1c- 7.9.  Continue Lantus 20 units daily (On levemir 75 mg bid/Novolog 5 untis bid PTA- reports eats better at home). Continue to monitor BS ac/hs and use SSI for elevated blood sugars.             Elevated on 8/17, will consider medication adjustments if persistent  Monitor with increased mobility 9.    ESRD: Schedule hemodialysis at the end of the day on TTS to help with tolerance of activity.  Metabolic bone disease managed with Hectorol and Renvela. 10.  COPD: Respiratory status stable on Incruse daily. With congestion- CXR shows atelectasis. Encourage use of IS.  11.  Anemia of chronic disease: Serial CBCs with hemodialysis.  On IV Aranesp weekly with Wynonia Lawman twice daily.  Hemoglobin 7.4 on 8/15  Continue to monitor 12.  History of CAD: Carvedilol decreased due to hypotension --continue Coreg 3.125 mg bid with Lipitor and ASA. 13.  Hypertension  See #12  Labile, particularly diastolic pressures on 7/94, monitor for trend              Monitor with increased mobility. 14.  Leukocytosis  WBC 13.7 on 8/15  Afebrile  Continue to monitor  LOS: 4 days A FACE TO FACE EVALUATION WAS PERFORMED  Jonathon Bailey Jonathon Bailey 05/10/2020, 8:21 AM

## 2020-05-10 NOTE — Progress Notes (Signed)
Patient ID: Jonathon Bailey, male   DOB: 05/29/1956, 64 y.o.   MRN: [id number] Late entry from yesterday.  Dressing removed. Some slight serous drainage but skin edges intact and appears to be healing quite nicely. Doing well in rehab

## 2020-05-10 NOTE — NC FL2 (Signed)
[place] LEVEL OF CARE SCREENING TOOL     IDENTIFICATION  Patient Name: Jonathon Bailey Birthdate: 07-12-1956 Sex: male Admission Date (Current Location): 05/06/2020  [facility] and [place] Number:  Herbalist and Address:  [place]. [facility], [place] [address], [place],  [address]      Provider Number: [id number]  Attending Physician Name and Address:  Jamse Arn, MD  Relative Name and Phone Number:  Waunita Schooner  [phone]    Current Level of Care: Hospital Recommended Level of Care: [place] Prior Approval Number:    Date Approved/Denied:   PASRR Number: [id number]  Discharge Plan: SNF    Current Diagnoses: Patient Active Problem List   Diagnosis Date Noted  . Leukocytosis   . Essential hypertension   . Labile blood pressure   . Controlled type 2 diabetes mellitus with hyperglycemia, with long-term current use of insulin ([place])   . Unilateral complete BKA, left, initial encounter ([place]) 05/06/2020  . Postoperative pain   . Acute on chronic anemia   . Anemia of chronic disease   . S/P BKA (below knee amputation) unilateral, left ([place])   . Sepsis without septic shock ([place]) 04/28/2020  . Wet gangrene ([place]) 04/28/2020  . Diabetic retinopathy ([place]) 08/21/2016  . Other specified fever   . Generalized abdominal pain   . Acute lower GI bleeding   . ESRD (end stage renal disease) ([place])   . Bacterial peritonitis ([place])   . Blood poisoning   . ESRD on peritoneal dialysis ([place])   . Acute blood loss anemia   . Abdominal pain 09/01/2014  . CAPD (continuous ambulatory peritoneal dialysis) status 03/15/2014  . Morbid obesity ([place]) 02/01/2014  . 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 Preventative health care 12/02/2013  . Other complications due to renal dialysis device, implant, and graft 10/24/2012  . COPD (chronic obstructive pulmonary disease)  ([place]) 10/23/2012  . DIABETIC PERIPHERAL NEUROPATHY 01/09/2010  . ESRD (end stage renal disease) on dialysis ([place]) 08/30/2009  . CEREBROVASCULAR ACCIDENT, HX OF 08/30/2009  . Diabetes mellitus type 2 with complications ([place]) 93/81/0175  . HYPERLIPIDEMIA 09/21/2008  . OBSTRUCTIVE SLEEP APNEA 09/21/2008  . ESSENTIAL HYPERTENSION 09/21/2008  . Coronary atherosclerosis 09/21/2008  . Chronic combined systolic and diastolic heart failure ([place]) 09/21/2008    Orientation RESPIRATION BLADDER Height & Weight     Self, Time, Situation, Place  Normal Continent Weight: 267 lb 13.7 oz (121.5 kg) Height:  6' (182.9 cm)  BEHAVIORAL SYMPTOMS/MOOD NEUROLOGICAL BOWEL NUTRITION STATUS      Continent Diet (renal diet 1200 fluid restriction)  AMBULATORY STATUS COMMUNICATION OF NEEDS Skin   Extensive Assist Verbally Surgical wounds                       Personal Care Assistance Level of Assistance  Bathing, Dressing Bathing Assistance: Limited assistance Feeding assistance: Independent Dressing Assistance: Limited assistance     Functional Limitations Info             SPECIAL CARE FACTORS FREQUENCY  PT (By licensed PT), OT (By licensed OT)     PT Frequency: 5x week OT Frequency: 5x week            Contractures Contractures Info: Not present    Additional Factors Info  Code Status, Allergies Code Status Info: Full Code Allergies Info: Ferumoxytol, Iodinated Diagnostic Agents  Current Medications (05/10/2020):  This is the current hospital active medication list Current Facility-Administered Medications  Medication Dose Route Frequency Provider Last Rate Last Admin  . acetaminophen (TYLENOL) tablet 325-650 mg  325-650 mg Oral Q4H PRN Bary Leriche, PA-C   650 mg at 05/09/20 1704  . aluminum hydroxide (AMPHOJEL/ALTERNAGEL) suspension 10 mL  10 mL Oral Q4H PRN Love, Pamela S, PA-C      . ascorbic acid (VITAMIN C) tablet 1,000 mg  1,000 mg Oral BID Reesa Chew S, PA-C    1,000 mg at 05/10/20 0733  . aspirin chewable tablet 81 mg  81 mg Oral Len Childs, PA-C   81 mg at 05/09/20 1151  . atorvastatin (LIPITOR) tablet 40 mg  40 mg Oral QHS Love, Pamela S, PA-C   40 mg at 05/08/20 2200  . bisacodyl (DULCOLAX) suppository 10 mg  10 mg Rectal Daily PRN Bary Leriche, PA-C   10 mg at 05/07/20 0930  . carvedilol (COREG) tablet 3.125 mg  3.125 mg Oral BID WC LoveIvan Anchors, PA-C   3.125 mg at 05/09/20 1704  . cetaphil lotion 1 application  1 application Topical Daily Bary Leriche, PA-C   1 application at 50/27/74 1287  . Chlorhexidine Gluconate Cloth 2 % PADS 6 each  6 each Topical Q0600 Bary Leriche, PA-C   6 each at 05/10/20 [phone]  . cinacalcet (SENSIPAR) tablet 90 mg  90 mg Oral Q supper Bary Leriche, PA-C   90 mg at 05/09/20 1704  . [START ON 05/12/2020] Darbepoetin Alfa (ARANESP) injection 100 mcg  100 mcg Intravenous Q Thu-HD Love, Pamela S, PA-C      . diphenhydrAMINE (BENADRYL) 12.5 MG/5ML elixir 12.5-25 mg  12.5-25 mg Oral Q6H PRN Love, Pamela S, PA-C      . doxercalciferol (HECTOROL) injection 6 mcg  6 mcg Intravenous Q T,Th,Sa-HD Love, Pamela S, PA-C      . excerise for heart and health book   Does not apply Once Jamse Arn, MD      . ferrous gluconate [facility]) tablet 324 mg  324 mg Oral BID WC Bary Leriche, PA-C   324 mg at 05/10/20 0733  . guaiFENesin-dextromethorphan (ROBITUSSIN DM) 100-10 MG/5ML syrup 5-10 mL  5-10 mL Oral Q6H PRN Bary Leriche, PA-C   10 mL at 05/08/20 0754  . heparin injection 5,000 Units  5,000 Units Subcutaneous Q8H Bary Leriche, PA-C   5,000 Units at 05/10/20 7209  . insulin aspart (novoLOG) injection 0-15 Units  0-15 Units Subcutaneous TID WC Bary Leriche, PA-C   2 Units at 05/10/20 0732  . insulin glargine (LANTUS) injection 20 Units  20 Units Subcutaneous QHS Bary Leriche, PA-C   20 Units at 05/09/20 2157  . kidney failure book   Does not apply Once Jamse Arn, MD      . melatonin tablet 5 mg  5  mg Oral QHS Love, Pamela S, PA-C   5 mg at 05/09/20 2156  . menthol-cetylpyridinium (CEPACOL) lozenge 3 mg  1 lozenge Oral Daily Bary Leriche, PA-C   3 mg at 05/10/20 0737  . milk and molasses enema  1 enema Rectal Daily PRN Love, Pamela S, PA-C      . multivitamin (RENA-VIT) tablet 1 tablet  1 tablet Oral QHS Bary Leriche, PA-C   1 tablet at 05/08/20 2201  . oxyCODONE (Oxy IR/ROXICODONE) immediate release tablet 5-10 mg  5-10 mg Oral Q4H  PRN Bary Leriche, PA-C   10 mg at 05/10/20 1213  . pantoprazole (PROTONIX) EC tablet 40 mg  40 mg Oral Daily Bary Leriche, PA-C   40 mg at 05/10/20 0732  . polyethylene glycol (MIRALAX / GLYCOLAX) packet 17 g  17 g Oral BID Angiulli, Lavon Paganini, PA-C      . prochlorperazine (COMPAZINE) tablet 5-10 mg  5-10 mg Oral Q6H PRN Bary Leriche, PA-C   10 mg at 05/09/20 2156   Or  . prochlorperazine (COMPAZINE) injection 5-10 mg  5-10 mg Intramuscular Q6H PRN Love, Pamela S, PA-C       Or  . prochlorperazine (COMPAZINE) suppository 12.5 mg  12.5 mg Rectal Q6H PRN Love, Pamela S, PA-C      . senna-docusate (Senokot-S) tablet 2 tablet  2 tablet Oral QHS Bary Leriche, PA-C   2 tablet at 05/08/20 2201  . sevelamer carbonate (RENVELA) powder PACK 4.8 g  4.8 g Oral TID WC Love, Ivan Anchors, PA-C   4.8 g at 05/10/20 1215  . sorbitol 70 % solution 30 mL  30 mL Oral Daily PRN Angiulli, Daniel J, PA-C      . tobramycin (TOBREX) 0.3 % ophthalmic solution 1 drop  1 drop Both Eyes Q6H PRN Love, Pamela S, PA-C      . umeclidinium bromide (INCRUSE ELLIPTA) 62.5 MCG/INH 1 puff  1 puff Inhalation Daily Jamse Arn, MD   1 puff at 05/10/20 2122     Discharge Medications: Please see discharge summary for a list of discharge medications.  Relevant Imaging Results:  Relevant Lab Results:   Additional Information ESRD T,TH Sat. [place] Pt has had his COVID vaccines  Celisse Ciulla, Gardiner Rhyme, LCSW

## 2020-05-10 NOTE — Progress Notes (Signed)
Pt refused night medications due to nausea.

## 2020-05-10 NOTE — Progress Notes (Signed)
[facility] Progress Note   Subjective:   Patient seen bedside, no acute events. Due for hd today  Objective Vitals:   05/09/20 1929 05/10/20 0438 05/10/20 0500 05/10/20 0832  BP: 126/71 (!) 139/113    Pulse: 79 83  89  Resp: 16 18  20   Temp: 98.6 F (37 C) 97.9 F (36.6 C)    TempSrc: Oral Oral    SpO2: 95% 94%  92%  Weight:   121.5 kg   Height:       Physical Exam General:NAD, sitting in wheelchair Heart:RRR; no murmur Lungs:CTAB, unlabored, bl chest expansion Abdomen:soft, non-tender, non-distended, +BS Extremities:No RLE edema; L BKA bandaged Dialysis Access:TDC in L chest (chronic): c/d/i   Additional Objective Labs: Basic Metabolic Panel: Recent Labs  Lab 05/04/20 0521 05/04/20 1001 05/05/20 0338 05/06/20 0050 05/08/20 1149  NA   < >  --  133* 134* 131*  K   < >  --  4.6 4.1 4.9  CL   < >  --  95* 94* 92*  CO2   < >  --  23 27 23   GLUCOSE   < >  --  112* 97 123*  BUN   < >  --  49* 25* 53*  CREATININE   < >  --  10.30* 7.19* 12.34*  CALCIUM   < >  --  8.2* 8.7* 8.1*  PHOS  --  6.1*  --   --  6.3*   < > = values in this interval not displayed.   Liver Function Tests: Recent Labs  Lab 05/04/20 0521 05/04/20 0521 05/05/20 0338 05/06/20 0050 05/08/20 1149  AST 36  --  41 42*  --   ALT 21  --  17 18  --   ALKPHOS 74  --  75 80  --   BILITOT 0.6  --  0.4 0.6  --   PROT 7.2  --  7.4 7.4  --   ALBUMIN 2.1*   < > 2.1* 2.2* 2.1*   < > = values in this interval not displayed.   CBC: Recent Labs  Lab 05/04/20 0521 05/08/20 1148  WBC 12.0* 13.7*  HGB 8.1* 7.4*  HCT 27.8* 25.3*  MCV 88.8 90.0  PLT 386 478*   Blood Culture    Component Value Date/Time   SDES BLOOD RIGHT FOREARM 04/28/2020 1517   SPECREQUEST  04/28/2020 1517    BOTTLES DRAWN AEROBIC AND ANAEROBIC Blood Culture results may not be optimal due to an inadequate volume of blood received in culture bottles   CULT  04/28/2020 1517    NO GROWTH 5 DAYS Performed  at [facility], [place] [address], [place], [place] [address]    REPTSTATUS 05/03/2020 FINAL 04/28/2020 1517   CBG: Recent Labs  Lab 05/09/20 0602 05/09/20 1120 05/09/20 1623 05/09/20 2056 05/10/20 0602  GLUCAP 118* 123* 107* 100* 139*    Studies/Results: No results found. Medications:  . vitamin C  1,000 mg Oral BID  . aspirin  81 mg Oral QODAY  . atorvastatin  40 mg Oral QHS  . carvedilol  3.125 mg Oral BID WC  . cetaphil  1 application Topical Daily  . Chlorhexidine Gluconate Cloth  6 each Topical Q0600  . cinacalcet  90 mg Oral Q supper  . [START ON 05/12/2020] darbepoetin (ARANESP) injection - DIALYSIS  100 mcg Intravenous Q Thu-HD  . doxercalciferol  6 mcg Intravenous Q T,Th,Sa-HD  . ferrous gluconate  324 mg  Oral BID WC  . heparin  5,000 Units Subcutaneous Q8H  . insulin aspart  0-15 Units Subcutaneous TID WC  . insulin glargine  20 Units Subcutaneous QHS  . melatonin  5 mg Oral QHS  . menthol-cetylpyridinium  1 lozenge Oral Daily  . multivitamin  1 tablet Oral QHS  . pantoprazole  40 mg Oral Daily  . senna-docusate  2 tablet Oral QHS  . sevelamer carbonate  4.8 g Oral TID WC  . umeclidinium bromide  1 puff Inhalation Daily    Dialysis Orders: TTS @ [facility] 4.5h 400/800 126kg 2/2.25 bath Hep 12000bolusTDC -Mircera7mcg q 2wks - just reordered/not yet given,lastdose1/26/21 -Hectorol62mcg IV qHD  Assessment/Plan: 1. PAD with L leg ischemia + gangrene:X-ray w/o signs of osteomyelitis, on empiricVanc + Zosyn.VVS consulted-> underwent BLE arteriogram 8/6showingsevere bilateralPAD below the knees. 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 Donnetta Hutching. In CIR 2. ESRD: on TTS schedule, next HD 8/17 today 3. Hypotension/volume: Chronic low BP - Coreg reduced. No volume overload on exam. EDW will be lower on discharge. Will aim for 121kg per post treatment today. Can challenge edw from there but overall euvolemic on my exam 4. AnemiaofESRD: Last Hgb7.4 8/15, CBC  pending today.Aranesp 135mcg q Thurs.No IV iron due to allergy- started on PO iron BID instead (tsat 6%, ferritin 218) -cbc today 5. Secondary Hyperparathyroidism:CorrCa ok, Phos up slightly.Continue VDRA, sensipar,and binders.RFP pending 6. Constipation: Givenmag citrate + POdulcolax 8/8. 7. Nutrition- Renal diet w/fluid restrictions 8. DMT2- Per primary 9. CAD s/p CABG - Per primary 10. Hx CVA 11. COPD  Gean Quint, MD [facility] 05/10/2020, 10:13 AM

## 2020-05-10 NOTE — Progress Notes (Signed)
  Progress Note    05/10/2020 7:42 AM * No surgery found *  Subjective:  No complaints  Vitals:   05/09/20 1929 05/10/20 0438  BP: 126/71 (!) 139/113  Pulse: 79 83  Resp: 16 18  Temp: 98.6 F (37 C) 97.9 F (36.6 C)  SpO2: 95% 94%    Physical Exam: Incisions:  L BKA incision c/d/i   CBC    Component Value Date/Time   WBC 13.7 (H) 05/08/2020 1148   RBC 2.81 (L) 05/08/2020 1148   HGB 7.4 (L) 05/08/2020 1148   HCT 25.3 (L) 05/08/2020 1148   PLT 478 (H) 05/08/2020 1148   MCV 90.0 05/08/2020 1148   MCH 26.3 05/08/2020 1148   MCHC 29.2 (L) 05/08/2020 1148   RDW 17.6 (H) 05/08/2020 1148   LYMPHSABS 1.0 04/29/2020 1737   MONOABS 0.6 04/29/2020 1737   EOSABS 0.0 04/29/2020 1737   BASOSABS 0.0 04/29/2020 1737    BMET    Component Value Date/Time   NA 131 (L) 05/08/2020 1149   K 4.9 05/08/2020 1149   CL 92 (L) 05/08/2020 1149   CO2 23 05/08/2020 1149   GLUCOSE 123 (H) 05/08/2020 1149   BUN 53 (H) 05/08/2020 1149   CREATININE 12.34 (H) 05/08/2020 1149   CREATININE 7.17 (H) 10/23/2012 1554   CALCIUM 8.1 (L) 05/08/2020 1149   GFRNONAA 4 (L) 05/08/2020 1149   GFRAA 4 (L) 05/08/2020 1149    INR    Component Value Date/Time   INR 1.4 (H) 05/02/2020 0345     Intake/Output Summary (Last 24 hours) at 05/10/2020 0742 Last data filed at 05/09/2020 1332 Gross per 24 hour  Intake 330 ml  Output --  Net 330 ml     Assessment/Plan:  64 y.o. male is s/p left below knee amputation   - Incision appears to be healing well - Ok for stump sock - Will continue to check periodically while in [place], PA-C [facility] [phone] 05/10/2020 7:42 AM

## 2020-05-11 ENCOUNTER — Inpatient Hospital Stay (HOSPITAL_COMMUNITY): Payer: No Typology Code available for payment source | Admitting: Occupational Therapy

## 2020-05-11 ENCOUNTER — Inpatient Hospital Stay (HOSPITAL_COMMUNITY): Payer: No Typology Code available for payment source

## 2020-05-11 ENCOUNTER — Encounter (HOSPITAL_COMMUNITY): Payer: No Typology Code available for payment source | Admitting: Psychology

## 2020-05-11 DIAGNOSIS — E1165 Type 2 diabetes mellitus with hyperglycemia: Secondary | ICD-10-CM | POA: Diagnosis not present

## 2020-05-11 DIAGNOSIS — Z89512 Acquired absence of left leg below knee: Secondary | ICD-10-CM | POA: Diagnosis not present

## 2020-05-11 DIAGNOSIS — R140 Abdominal distension (gaseous): Secondary | ICD-10-CM | POA: Diagnosis not present

## 2020-05-11 DIAGNOSIS — R0989 Other specified symptoms and signs involving the circulatory and respiratory systems: Secondary | ICD-10-CM | POA: Diagnosis not present

## 2020-05-11 DIAGNOSIS — I10 Essential (primary) hypertension: Secondary | ICD-10-CM | POA: Diagnosis not present

## 2020-05-11 DIAGNOSIS — K5903 Drug induced constipation: Secondary | ICD-10-CM

## 2020-05-11 LAB — GLUCOSE, CAPILLARY
Glucose-Capillary: 76 mg/dL (ref 70–99)
Glucose-Capillary: 151 mg/dL — ABNORMAL HIGH (ref 70–99)
Glucose-Capillary: 113 mg/dL — ABNORMAL HIGH (ref 70–99)
Glucose-Capillary: 127 mg/dL — ABNORMAL HIGH (ref 70–99)

## 2020-05-11 MED ORDER — SENNOSIDES-DOCUSATE SODIUM 8.6-50 MG PO TABS
2.0000 | ORAL_TABLET | Freq: Two times a day (BID) | ORAL | Status: DC
  Administered 2020-05-11 – 2020-05-18 (×8): 2 via ORAL
  Filled 2020-05-11 (×13): qty 2

## 2020-05-11 NOTE — Progress Notes (Addendum)
PHYSICAL MEDICINE & REHABILITATION PROGRESS NOTE   Subjective/Complaints: Patient seen sitting up at the edge of his bed this morning.  Good sitting balance noted.  He states he slept fairly overnight.  He was seen by vascular yesterday, notes reviewed-no changes.  ROS: Denies CP, SOB, N/V/D  Objective:   DG Abd 1 View  Result Date: 05/10/2020 CLINICAL DATA:  Abdominal distension, nausea and vomiting, constipation EXAM: ABDOMEN - 1 VIEW COMPARISON:  05/01/2020 FINDINGS: Two supine frontal views of the abdomen and pelvis are obtained, excluding portions of the right flank by collimation. Bowel gas pattern is unremarkable without obstruction or ileus. No masses or abnormal calcifications. Bony structures are unremarkable. IMPRESSION: 1. Unremarkable bowel gas pattern. Electronically Signed   By: Randa Ngo M.D.   On: 05/10/2020 20:58   Recent Labs    05/08/20 1148 05/10/20 1521  WBC 13.7* 12.9*  HGB 7.4* 7.1*  HCT 25.3* 25.0*  PLT 478* 447*   Recent Labs    05/08/20 1149 05/10/20 1521  NA 131* 134*  K 4.9 4.3  CL 92* 93*  CO2 23 25  GLUCOSE 123* 130*  BUN 53* 46*  CREATININE 12.34* 10.71*  CALCIUM 8.1* 8.0*    Intake/Output Summary (Last 24 hours) at 05/11/2020 0907 Last data filed at 05/10/2020 1805 Gross per 24 hour  Intake --  Output 671 ml  Net -671 ml     Physical Exam: Vital Signs Blood pressure 129/76, pulse 63, temperature 98.9 F (37.2 C), resp. rate 17, height 6' (1.829 m), weight 121.8 kg, SpO2 94 %. Constitutional: No distress . Vital signs reviewed. HENT: Normocephalic.  Atraumatic. Eyes: EOMI. No discharge. Cardiovascular: No JVD.  RRR. Respiratory: Normal effort.  No stridor.  Bilateral clear to auscultation. GI: Non-distended.  BS +. Skin: Left BKA with dressing CDI Psych: Flat, appears disinterested. Musc: Left BKA with edema and tenderness Neuro: Alert Motor: Bilateral upper extremities: 5/5 proximal distal Left lower  extremity: 4+/5 proximal distally Right lower extremity: Hip flexion 4/5 (pain inhibition), improving  Assessment/Plan: 1. Functional deficits secondary to left BKA which require 3+ hours per day of interdisciplinary therapy in a comprehensive inpatient rehab setting.  Physiatrist is providing close team supervision and 24 hour management of active medical problems listed below.  Physiatrist and rehab team continue to assess barriers to discharge/monitor patient progress toward functional and medical goals  Care Tool:  Bathing  Bathing activity did not occur: Refused Body parts bathed by patient: Right arm, Left arm, Chest, Abdomen, Front perineal area, Buttocks, Right upper leg, Left upper leg, Face   Body parts bathed by helper: Right lower leg Body parts n/a: Left lower leg   Bathing assist Assist Level: Minimal Assistance - Patient > 75%     Upper Body Dressing/Undressing Upper body dressing   What is the patient wearing?: Hospital gown only    Upper body assist Assist Level: Minimal Assistance - Patient > 75%    Lower Body Dressing/Undressing Lower body dressing      What is the patient wearing?: Pants     Lower body assist Assist for lower body dressing: Maximal Assistance - Patient 25 - 49%     Toileting Toileting Toileting Activity did not occur (Clothing management and hygiene only): N/A (no void or bm)  Toileting assist Assist for toileting: Maximal Assistance - Patient 25 - 49%     Transfers Chair/bed transfer  Transfers assist     Chair/bed transfer assist level: Minimal Assistance - Patient >  75%     Locomotion Ambulation   Ambulation assist   Ambulation activity did not occur: Safety/medical concerns          Walk 10 feet activity   Assist  Walk 10 feet activity did not occur: Safety/medical concerns        Walk 50 feet activity   Assist Walk 50 feet with 2 turns activity did not occur: Safety/medical concerns          Walk 150 feet activity   Assist Walk 150 feet activity did not occur: Safety/medical concerns         Walk 10 feet on uneven surface  activity   Assist Walk 10 feet on uneven surfaces activity did not occur: Safety/medical concerns         Wheelchair     Assist Will patient use wheelchair at discharge?: Yes Type of Wheelchair: Manual    Wheelchair assist level: Supervision/Verbal cueing Max wheelchair distance: 173ft    Wheelchair 50 feet with 2 turns activity    Assist        Assist Level: Supervision/Verbal cueing   Wheelchair 150 feet activity     Assist      Assist Level: Moderate Assistance - Patient 50 - 74%   Blood pressure 129/76, pulse 63, temperature 98.9 F (37.2 C), resp. rate 17, height 6' (1.829 m), weight 121.8 kg, SpO2 94 %.    Medical Problem List and Plan: 1.  Limitations with mobility, transfers, endurance, self-care secondary to left BKA.  Continue CIR  Will consider shrinker  Team conference today to discuss current and goals and coordination of care, home and environmental barriers, and discharge planning with nursing, case manager, and therapies. Please see conference note from today as well.  2.  Antithrombotics: -DVT/anticoagulation:  Pharmaceutical: Heparin             -antiplatelet therapy: ASA daily  3. Pain Management: Oxycodone as needed   Controlled with meds on 8/18             Monitor with increased exertion. 4. Mood: Team support             -antipsychotic agents: N/a 5. Neuropsych: This patient is capable of making decisions on his own behalf. 6. Skin/Wound Care: Daily dressing changes. Monitor wound for healing.  Vitamin C for healing.  Prosource added to promote wound healing.  7. Fluids/Electrolytes/Nutrition: Monitor I/O.  Modified/renal diet with 1200 ccFR/day. Labs monitored with HD. 8.  T2DM with hyperglycemia:  Hgb A1c- 7.9.  Continue Lantus 20 units daily (On levemir 75 mg bid/Novolog 5 untis  bid PTA- reports eats better at home). Continue to monitor BS ac/hs and use SSI for elevated blood sugars.  Controlled on 8/18  Monitor with increased mobility 9.  ESRD: Schedule hemodialysis at the end of the day on TTS to help with tolerance of activity.  Metabolic bone disease managed with Hectorol and Renvela. 10.  COPD: Respiratory status stable on Incruse daily. With congestion- CXR shows atelectasis. Encourage use of IS.  11.  Anemia of chronic disease: Serial CBCs with hemodialysis.  On IV Aranesp weekly with Wynonia Lawman twice daily.  Hemoglobin 7.1 on 8/17  Continue to monitor 12.  History of CAD: Carvedilol decreased due to hypotension --continue Coreg 3.125 mg bid with Lipitor and ASA. 13.  Hypertension  See #12  Relatively controlled on 8/18             Monitor with increased mobility. 14.  Leukocytosis  WBC 12.9 on 8/17  Afebrile  Continue to monitor 15.  Drug-induced constipation  Abdominal x-ray unremarkable  Bowel meds increased on 8/18  LOS: 5 days A FACE TO FACE EVALUATION WAS PERFORMED  Corley Maffeo Lorie Phenix 05/11/2020, 9:07 AM

## 2020-05-11 NOTE — Progress Notes (Signed)
Physical Therapy Session Note  Patient Details  Name: Jonathon Bailey MRN: [id number] Date of Birth: Jan 10, 1956  Today's Date: 05/11/2020 PT Individual Time: 1015-1129 PT Individual Time Calculation (min): 74 min   Short Term Goals: Week 1:  PT Short Term Goal 1 (Week 1): pt to demonstrate supine<>sit at min A x1 PT Short Term Goal 2 (Week 1): pt to demonstrate transfers with LRAD at min A x1 PT Short Term Goal 4 (Week 1): pt to initiate standing with RW PT Short Term Goal 5 (Week 1): pt to mobilize WC 150' min A  Skilled Therapeutic Interventions/Progress Updates:   Received pt sitting in WC, pt agreeable to therapy, and reported pain 6/10 in L residual limb (premedicated). Repositioning, rest breaks, and distraction done to reduce pain levels. Session with emphasis on functional mobility/transfers, discharge planning, generalized strengthening, dynamic standing balance/coordination, amputee education, and improved activity tolerance. Pt with questions regarding home setup and equipment telling therapist "well we need to take a fieldtrip to my house so you can see everything because I need to make sure I get the right equipment." Therapist educated pt on equipment ordering process and measurements and suggested pt's son take measurements of doorways and furniture however pt stated that his son wouldn't be able to do that and is ultimately not available to help him at all. Pt performed WC mobility 68ft using bilateral UEs and supervision and transported remainder of way to ortho gym total A. Pt transferred WC<>mat via lateral scoot min A with cues for hand placement and anterior weight shifting. Pt transferred sit<>stand with RW and mod A from elevated mat x 3 trials and tossed horseshoes x 3 trials using R UE and min A for balance. Pt worked on dynamic sitting balance tossing horseshoes x 1 trial per pt request. Pt slightly irritable during session stating "you're trying to rush me" when therapist  asked about home enviornment/setup and what kind of assist he will have access to at home. Per pt, his son is a Electronics engineer and essentially won't help him at all. Pt fixated on explaining rules of game of horseshoes to therapist while working on sitting balance on mat. Pt picked up horseshoes from floor while sitting on mat and worked on lateral leans and reaching outside BOS to [employer] and put them in various positions on mat while explaining game to therapist. Pt transferred mat<>WC via lateral scoot min A and transported back to room in [facility] total A. Concluded session with pt sitting in WC, needs within reach, and seatbelt alarm on.   Therapy Documentation Precautions:  Precautions Precautions: Fall Precaution Comments: recent L BKA Restrictions Weight Bearing Restrictions: Yes LLE Weight Bearing: Non weight bearing  Therapy/Group: Individual Therapy Alfonse Alpers PT, DPT   05/11/2020, 7:41 AM

## 2020-05-11 NOTE — Progress Notes (Signed)
Occupational Therapy Session Note  Patient Details  Name: Jonathon Bailey MRN: [id number] Date of Birth: 1955-10-28  Today's Date: 05/11/2020 OT Individual Time: 9326-7124 OT Individual Time Calculation (min): 70 min    Short Term Goals: Week 1:  OT Short Term Goal 1 (Week 1): Pt will trf with LRAD  to w/c in prep for toileting with MOD A OT Short Term Goal 2 (Week 1): Pt will thread BLE into pants with no more than CGA for sitting balance OT Short Term Goal 3 (Week 1): Pt will lean laterally EOB/EOM with S in prep for toileting  Skilled Therapeutic Interventions/Progress Updates:    Treatment session with focus on care for residual limb, sit > stand, and dynamic standing balance.  Pt received upright in w/c with RN arriving to change dressing.  Therapist assisted with re-wrapping residual limb after nursing completed dressing change.  Educated pt on figure 8 wrapping for appropriate shaping.  Pt reports extreme pain with wrapping.  Educated on pain management techniques, to be further explored when pain not so high.  Engaged in functional transfers and sit >stand in therapy gym.  Pt completed lateral scoot transfers CGA with cues for anterior weight shift to increase clearance of buttocks.  Sit > stand from elevated mat with heavy reliance on RW and min assist.  Engaged in Connect 4 activity in standing with focus on decreasing reliance on UE and ability to utilize alternating single UE functionally.  Educated on functional carryover with ADLs and IADLs.  Pt tolerated standing 2-3 mins each attempt.  Pt propelled w/c 100' with cues for technique to increase propulsion.  Pt transferred back to bed CGA lateral scoot and left seated EOB with all needs in reach.  Therapy Documentation Precautions:  Precautions Precautions: Fall Precaution Comments: recent L BKA Restrictions Weight Bearing Restrictions: Yes LLE Weight Bearing: Non weight bearing General:   Vital Signs: Therapy  Vitals Temp: 99 F (37.2 C) Temp Source: Oral Pulse Rate: 88 Resp: 16 BP: 112/68 Patient Position (if appropriate): Sitting Oxygen Therapy SpO2: 94 % O2 Device: Room Air Pain: Pain Assessment Pain Scale: 0-10 Pain Score: 8  Pain Type: Acute pain Pain Location: Leg Pain Orientation: Left Pain Descriptors / Indicators: Aching Pain Frequency: Constant Pain Onset: On-going Pain Intervention(s): Medication (See eMAR)   Therapy/Group: Individual Therapy  [name] 05/11/2020, 4:04 PM

## 2020-05-11 NOTE — Progress Notes (Signed)
Occupational Therapy Session Note  Patient Details  Name: Jonathon Bailey MRN: [id number] Date of Birth: 1956/01/10  Today's Date: 05/11/2020 OT Individual Time: 6734-1937 OT Individual Time Calculation (min): 58 min    Short Term Goals: Week 1:  OT Short Term Goal 1 (Week 1): Pt will trf with LRAD  to w/c in prep for toileting with MOD A OT Short Term Goal 2 (Week 1): Pt will thread BLE into pants with no more than CGA for sitting balance OT Short Term Goal 3 (Week 1): Pt will lean laterally EOB/EOM with S in prep for toileting  Skilled Therapeutic Interventions/Progress Updates:    Treatment session with focus on functional transfers, sit > stand, and overall strengthening/endurance during functional mobility.  Pt received seated EOB, declining bathing/dressing this session but agreeable to therapy session.  Engaged in sit > stand from elevated EOB with min assist with heavy reliance on RW to allow for LB clothing adjustment.  Therapist assisted with adjusting pants over hips while pt maintained standing balance with BUE support on RW.  Completed slide board transfer bed > w/c with min assist with therapist placing slide board while educating on proper positioning of slide board and then therapist stabilizing w/c while pt transferred with min assist and cues for anterior weight shift.  Pt completed grooming and UB bathing while seated at sink and then donned pull over shirt after setup.  Pt propelled w/c 52' with increased time and effort with focus on increased activity tolerance and BUE strengthening.  Therapist propelled w/c remainder of way to hospital gift shop as pt wanting to purchase new grooming items.  Discussed w/c mobility in community as well as accessing elevators and doorways from w/c.  Pt returned to room and remained upright in w/c with seat belt alarm on and all needs in reach.  Therapy Documentation Precautions:  Precautions Precautions: Fall Precaution Comments: recent L  BKA Restrictions Weight Bearing Restrictions: Yes LLE Weight Bearing: Non weight bearing Pain:  Pt with no c/o pain   Therapy/Group: Individual Therapy  [name] 05/11/2020, 12:26 PM

## 2020-05-11 NOTE — Progress Notes (Signed)
Patient ID: Jonathon Bailey, male   DOB: 06/04/1956, 64 y.o.   MRN: [id number]   Met with pt and spoke with daughter-Jonathon Bailey to discuss team conference goals supervision-min assist wheelchair level. Target discharge date 9/4. Discussed with both the 24 hr care recommendation pt with need at discharge and that he currently does not have that. Daughter was in agreement with pursuing going to a rehab. Pt wants to think about this and wanted a SNF list which was given to him. He does not recognize how much care he is and he talked about driving his car to HD, which is a long way off. Will work on safe discharge plan for pt.

## 2020-05-11 NOTE — Patient Care Conference (Signed)
Inpatient RehabilitationTeam Conference and Plan of Care Update Date: 05/11/2020   Time: 11:32 AM    Patient Name: Jonathon Bailey      Medical Record Number: [id number]  Date of Birth: [birth year] Sex: Male         Room/Bed: 4W04C/4W04C-01 Payor Info: Payor: MEDICARE / Plan: MEDICARE PART A AND B / Product Type: *No Product type* /    Admit Date/Time:  05/06/2020  4:09 PM  Primary Diagnosis:  S/P BKA (below knee amputation) unilateral, left [facility])  Hospital Problems: Principal Problem:   S/P BKA (below knee amputation) unilateral, left (HCC) Active Problems:   Leukocytosis   Essential hypertension   Labile blood pressure   Controlled type 2 diabetes mellitus with hyperglycemia, with long-term current use of insulin ([place])   Drug induced constipation    Expected Discharge Date: Expected Discharge Date: 05/29/20 (Home vs SNF)  Team Members Present: Care Coodinator Present: Dorien Chihuahua, RN, BSN, CRRN;Other (comment) Jacqlyn Larsen [place], SW) Nurse Present: Glenis Smoker, RN PT Present: Becky Sax, PT OT Present: [name], OT PPS Coordinator present : Gunnar Fusi, Novella Olive, PT     Current Status/Progress Goal Weekly Team Focus  Bowel/Bladder   Pt is continent of b/b. LBM 05/08/20- pt does not mKe urine  Pt will remain continent of b/b  Q2h toileting   Swallow/Nutrition/ Hydration             ADL's   min assist bathing and UB dressing, max assist LB dressing, min assist transfer via slide board,  Min assist sit > stand, Supervision bathing, dressing, and toilet transfers/toileting  ADL retraining, activity tolerance/endurance, sit > stand, standing balance, transfers, care for residual limb   Mobility   bed mobility mod A, lateral scoot transfers min A +2, WC mobility 47ft supervision  supervision  functional mobility/transfers, generalized strengthening, dynamic sitting/standing balance/coordination, amputee education, and endurance   Communication              Safety/Cognition/ Behavioral Observations            Pain   Pt c/o of LLE [place] pain. PRN Oxy effective  Pt will be pain free.  Assess pain qshift/prn   Skin   No obvious signs of aing breakdown; Incision site is OTA.  Assess skin qshift/PRN  Skin will be free of breakdown and free of infection     Discharge Planning:  Pt wants to return home with son who is in school and can only provide evening assist-if can't reach mod/i level may need to go to SNF for a short time   Team Discussion: Nausea, vomiting, poor memory, and need for encouragement to participate limiting progress.  Patient on target to meet rehab goals: no, patient is self limiting, requests extra time for own priorities. Currently Mod-Max Assist for transfers/toileting, etc. Goals set for supervision overall.  *See Care Plan and progress notes for long and short-term goals.   Revisions to Treatment Plan:   Teaching Needs: Transfers, toileting, medications, skin care, etc.  Current Barriers to Discharge: Inaccessible home environment and Decreased caregiver support, Hemodialysis,   Possible Resolutions to Barriers: SNF discharge     Medical Summary Current Status: Limitations with mobility, transfers, endurance, self-care secondary to left BKA  Barriers to Discharge: Hemodialysis;Medical stability;Weight;Weight bearing restrictions;Wound care;Decreased family/caregiver support   Possible Resolutions to [employer] Focus: Therapies, optimize bowel meds, follow labs, optimize BP/DM meds, optimize pain meds   Continued Need for Acute Rehabilitation Level of Care: The patient requires daily  medical management by a physician with specialized training in physical medicine and rehabilitation for the following reasons: Direction of a multidisciplinary physical rehabilitation program to maximize functional independence : Yes Medical management of patient stability for increased activity during participation in an  intensive rehabilitation regime.: Yes Analysis of laboratory values and/or radiology reports with any subsequent need for medication adjustment and/or medical intervention. : Yes   I attest that I was present, lead the team conference, and concur with the assessment and plan of the team.   Dorien Chihuahua B 05/11/2020, 4:34 PM

## 2020-05-11 NOTE — Progress Notes (Addendum)
[facility] Progress Note   Subjective:   Seen in room. No overnight CP or dyspnea. Had significant hypotension with HD yesterday, only got 358mL UF.   Objective Vitals:   05/10/20 1840 05/10/20 1843 05/10/20 2022 05/11/20 0609  BP: 119/70  118/68 129/76  Pulse: 80  82 63  Resp: 16  20 17   Temp:  98.5 F (36.9 C) 98.8 F (37.1 C) 98.9 F (37.2 C)  TempSrc:  Oral    SpO2: 95%  95% 94%  Weight:    121.8 kg  Height:       Physical Exam General: Well appearing man, NAD Heart: RRR; no murmur Lungs: CTA anteriorly Abdomen: soft, non-tender Extremities: No RLE edema, L BKA bandaged Dialysis Access:  TDC in L chest, non-tender  Additional Objective Labs: Basic Metabolic Panel: Recent Labs  Lab 05/04/20 1001 05/05/20 0338 05/06/20 0050 05/08/20 1149 05/10/20 1521  NA  --    < > 134* 131* 134*  K  --    < > 4.1 4.9 4.3  CL  --    < > 94* 92* 93*  CO2  --    < > 27 23 25   GLUCOSE  --    < > 97 123* 130*  BUN  --    < > 25* 53* 46*  CREATININE  --    < > 7.19* 12.34* 10.71*  CALCIUM  --    < > 8.7* 8.1* 8.0*  PHOS 6.1*  --   --  6.3* 3.8   < > = values in this interval not displayed.   Liver Function Tests: Recent Labs  Lab 05/05/20 0338 05/05/20 0338 05/06/20 0050 05/08/20 1149 05/10/20 1521  AST 41  --  42*  --   --   ALT 17  --  18  --   --   ALKPHOS 75  --  80  --   --   BILITOT 0.4  --  0.6  --   --   PROT 7.4  --  7.4  --   --   ALBUMIN 2.1*   < > 2.2* 2.1* 2.2*   < > = values in this interval not displayed.   CBC: Recent Labs  Lab 05/08/20 1148 05/10/20 1521  WBC 13.7* 12.9*  HGB 7.4* 7.1*  HCT 25.3* 25.0*  MCV 90.0 91.6  PLT 478* 447*   Studies/Results: DG Abd 1 View  Result Date: 05/10/2020 CLINICAL DATA:  Abdominal distension, nausea and vomiting, constipation EXAM: ABDOMEN - 1 VIEW COMPARISON:  05/01/2020 FINDINGS: Two supine frontal views of the abdomen and pelvis are obtained, excluding portions of the right flank by  collimation. Bowel gas pattern is unremarkable without obstruction or ileus. No masses or abnormal calcifications. Bony structures are unremarkable. IMPRESSION: 1. Unremarkable bowel gas pattern. Electronically Signed   By: Randa Ngo M.D.   On: 05/10/2020 20:58   Medications:   vitamin C  1,000 mg Oral BID   aspirin  81 mg Oral QODAY   atorvastatin  40 mg Oral QHS   carvedilol  3.125 mg Oral BID WC   cetaphil  1 application Topical Daily   Chlorhexidine Gluconate Cloth  6 each Topical Q0600   cinacalcet  90 mg Oral Q supper   [START ON 05/12/2020] darbepoetin (ARANESP) injection - DIALYSIS  100 mcg Intravenous Q Thu-HD   doxercalciferol  6 mcg Intravenous Q T,Th,Sa-HD   ferrous gluconate  324 mg Oral BID WC   heparin  5,000  Units Subcutaneous Q8H   insulin aspart  0-15 Units Subcutaneous TID WC   insulin glargine  20 Units Subcutaneous QHS   melatonin  5 mg Oral QHS   menthol-cetylpyridinium  1 lozenge Oral Daily   multivitamin  1 tablet Oral QHS   pantoprazole  40 mg Oral Daily   polyethylene glycol  17 g Oral BID   senna-docusate  2 tablet Oral QHS   sevelamer carbonate  4.8 g Oral TID WC   umeclidinium bromide  1 puff Inhalation Daily    Dialysis Orders: TTS @ [place] 4.5h 400/800 126kg 2/2.25 bath Hep 12000bolusTDC -Mircera40mcg q 2wks - just reordered/not yet given,lastdose1/26/21 -Hectorol59mcg IV qHD  Assessment/Plan: 1. PAD with L leg ischemia + gangrene:X-ray w/o signs of osteomyelitis, on empiricVanc + Zosyn.VVS consulted-> underwent BLE arteriogram 8/6showingsevere bilateralPAD below the knees. 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 Donnetta Hutching. In CIR 2. ESRD: Continue HD per TTS schedule - next HD 8/19. 3. Hypotension/volume: Chronic low BP - Coregreduced. No volume overload on exam. EDW will be lower on discharge. Minimal UF with last HD but overall euvolemic on exam. 4. AnemiaofESRD:Hgb 7.1.Continue Aranesp 110mcgq Thurs.No IV  iron due to allergy- started on PO iron BID instead (tsat 6%, ferritin 218). Likely transfuse 1U PRBC with next HD if Hgb remains same level. 5. Secondary Hyperparathyroidism:CorrCa/Phos good.Continue VDRA, sensipar,and binders. 6. Constipation: Givenmag citrate + POdulcolax 8/8. 7. Nutrition- Renal diet w/fluid restrictions 8. DMT2- Per primary 9. CAD s/p CABG -Per primary 10. Hx CVA 11. COPD  Pollyann Kennedy 05/11/2020, 8:58 AM  [employer]

## 2020-05-12 ENCOUNTER — Inpatient Hospital Stay (HOSPITAL_COMMUNITY): Payer: No Typology Code available for payment source

## 2020-05-12 DIAGNOSIS — Z89512 Acquired absence of left leg below knee: Secondary | ICD-10-CM | POA: Diagnosis not present

## 2020-05-12 DIAGNOSIS — R140 Abdominal distension (gaseous): Secondary | ICD-10-CM

## 2020-05-12 DIAGNOSIS — E1165 Type 2 diabetes mellitus with hyperglycemia: Secondary | ICD-10-CM | POA: Diagnosis not present

## 2020-05-12 DIAGNOSIS — R0989 Other specified symptoms and signs involving the circulatory and respiratory systems: Secondary | ICD-10-CM | POA: Diagnosis not present

## 2020-05-12 DIAGNOSIS — I10 Essential (primary) hypertension: Secondary | ICD-10-CM | POA: Diagnosis not present

## 2020-05-12 LAB — CBC
HCT: 25.5 % — ABNORMAL LOW (ref 39.0–52.0)
Hemoglobin: 7.2 g/dL — ABNORMAL LOW (ref 13.0–17.0)
MCH: 25.8 pg — ABNORMAL LOW (ref 26.0–34.0)
MCHC: 28.2 g/dL — ABNORMAL LOW (ref 30.0–36.0)
MCV: 91.4 fL (ref 80.0–100.0)
Platelets: 404 10*3/uL — ABNORMAL HIGH (ref 150–400)
RBC: 2.79 MIL/uL — ABNORMAL LOW (ref 4.22–5.81)
RDW: 17.8 % — ABNORMAL HIGH (ref 11.5–15.5)
WBC: 12.1 10*3/uL — ABNORMAL HIGH (ref 4.0–10.5)
nRBC: 0.7 % — ABNORMAL HIGH (ref 0.0–0.2)

## 2020-05-12 LAB — RENAL FUNCTION PANEL
Albumin: 2.2 g/dL — ABNORMAL LOW (ref 3.5–5.0)
Anion gap: 12 (ref 5–15)
BUN: 38 mg/dL — ABNORMAL HIGH (ref 8–23)
CO2: 26 mmol/L (ref 22–32)
Calcium: 8.2 mg/dL — ABNORMAL LOW (ref 8.9–10.3)
Chloride: 92 mmol/L — ABNORMAL LOW (ref 98–111)
Creatinine, Ser: 9.77 mg/dL — ABNORMAL HIGH (ref 0.61–1.24)
GFR calc Af Amer: 6 mL/min — ABNORMAL LOW (ref >60)
GFR calc non Af Amer: 5 mL/min — ABNORMAL LOW (ref >60)
Glucose, Bld: 122 mg/dL — ABNORMAL HIGH (ref 70–99)
Phosphorus: 4.2 mg/dL (ref 2.5–4.6)
Potassium: 4 mmol/L (ref 3.5–5.1)
Sodium: 130 mmol/L — ABNORMAL LOW (ref 135–145)

## 2020-05-12 LAB — GLUCOSE, CAPILLARY
Glucose-Capillary: 98 mg/dL (ref 70–99)
Glucose-Capillary: 125 mg/dL — ABNORMAL HIGH (ref 70–99)
Glucose-Capillary: 87 mg/dL (ref 70–99)
Glucose-Capillary: 159 mg/dL — ABNORMAL HIGH (ref 70–99)

## 2020-05-12 MED ORDER — HEPARIN SODIUM (PORCINE) 1000 UNIT/ML DIALYSIS: 1000.0000 [IU] | INTRAMUSCULAR | Status: DC | PRN

## 2020-05-12 MED ORDER — LIDOCAINE HCL (PF) 1 % IJ SOLN: 5.0000 mL | INTRAMUSCULAR | Status: DC | PRN

## 2020-05-12 MED ORDER — ALTEPLASE 2 MG IJ SOLR: 2.0000 mg | Freq: Once | INTRAMUSCULAR | Status: DC | PRN

## 2020-05-12 MED ORDER — DARBEPOETIN ALFA 100 MCG/0.5ML IJ SOSY
PREFILLED_SYRINGE | INTRAMUSCULAR | Status: AC
  Filled 2020-05-12: qty 0.5

## 2020-05-12 MED ORDER — PENTAFLUOROPROP-TETRAFLUOROETH EX AERO: 1.0000 "application " | INHALATION_SPRAY | CUTANEOUS | Status: DC | PRN

## 2020-05-12 MED ORDER — HEPARIN SODIUM (PORCINE) 1000 UNIT/ML IJ SOLN
INTRAMUSCULAR | Status: AC
  Filled 2020-05-12: qty 5

## 2020-05-12 MED ORDER — HEPARIN SODIUM (PORCINE) 1000 UNIT/ML DIALYSIS: 1200.0000 [IU] | INTRAMUSCULAR | Status: DC | PRN

## 2020-05-12 MED ORDER — HEPARIN SODIUM (PORCINE) 1000 UNIT/ML DIALYSIS: 20.0000 [IU]/kg | INTRAMUSCULAR | Status: DC | PRN

## 2020-05-12 MED ORDER — SODIUM CHLORIDE 0.9 % IV SOLN: 100.0000 mL | INTRAVENOUS | Status: DC | PRN

## 2020-05-12 MED ORDER — LIDOCAINE-PRILOCAINE 2.5-2.5 % EX CREA: 1.0000 "application " | TOPICAL_CREAM | CUTANEOUS | Status: DC | PRN

## 2020-05-12 NOTE — Progress Notes (Signed)
[facility] Progress Note   Subjective:  Seen in room. Tired, but otherwise without complaints. No CP/dyspnea. For HD later today.   Objective Vitals:   05/11/20 1305 05/11/20 1431 05/11/20 1945 05/12/20 0551  BP: 114/71 112/68 127/83 122/72  Pulse: 85 88 86 82  Resp: 19 16 18 18   Temp: 98.5 F (36.9 C) 99 F (37.2 C) 97.8 F (36.6 C) 98.8 F (37.1 C)  TempSrc:  Oral Oral Oral  SpO2: 93% 94% 99% 95%  Weight:    121.8 kg  Height:       Physical Exam General: Well appearing man, NAD Heart: RRR; no murmur Lungs: CTA anteriorly Abdomen: soft, non-tender Extremities: No RLE edema, L BKA bandaged Dialysis Access:  TDC in L chest, non-tender  Additional Objective Labs: Basic Metabolic Panel: Recent Labs  Lab 05/06/20 0050 05/08/20 1149 05/10/20 1521  NA 134* 131* 134*  K 4.1 4.9 4.3  CL 94* 92* 93*  CO2 27 23 25   GLUCOSE 97 123* 130*  BUN 25* 53* 46*  CREATININE 7.19* 12.34* 10.71*  CALCIUM 8.7* 8.1* 8.0*  PHOS  --  6.3* 3.8   Liver Function Tests: Recent Labs  Lab 05/06/20 0050 05/08/20 1149 05/10/20 1521  AST 42*  --   --   ALT 18  --   --   ALKPHOS 80  --   --   BILITOT 0.6  --   --   PROT 7.4  --   --   ALBUMIN 2.2* 2.1* 2.2*   CBC: Recent Labs  Lab 05/08/20 1148 05/10/20 1521  WBC 13.7* 12.9*  HGB 7.4* 7.1*  HCT 25.3* 25.0*  MCV 90.0 91.6  PLT 478* 447*   CBG: Recent Labs  Lab 05/11/20 0611 05/11/20 1201 05/11/20 1638 05/11/20 2106 05/12/20 0553  GLUCAP 76 151* 113* 127* 98   Studies/Results: DG Abd 1 View  Result Date: 05/10/2020 CLINICAL DATA:  Abdominal distension, nausea and vomiting, constipation EXAM: ABDOMEN - 1 VIEW COMPARISON:  05/01/2020 FINDINGS: Two supine frontal views of the abdomen and pelvis are obtained, excluding portions of the right flank by collimation. Bowel gas pattern is unremarkable without obstruction or ileus. No masses or abnormal calcifications. Bony structures are unremarkable. IMPRESSION:  1. Unremarkable bowel gas pattern. Electronically Signed   By: Randa Ngo M.D.   On: 05/10/2020 20:58   Medications:  . vitamin C  1,000 mg Oral BID  . aspirin  81 mg Oral QODAY  . atorvastatin  40 mg Oral QHS  . carvedilol  3.125 mg Oral BID WC  . cetaphil  1 application Topical Daily  . Chlorhexidine Gluconate Cloth  6 each Topical Q0600  . cinacalcet  90 mg Oral Q supper  . darbepoetin (ARANESP) injection - DIALYSIS  100 mcg Intravenous Q Thu-HD  . doxercalciferol  6 mcg Intravenous Q T,Th,Sa-HD  . ferrous gluconate  324 mg Oral BID WC  . heparin  5,000 Units Subcutaneous Q8H  . insulin aspart  0-15 Units Subcutaneous TID WC  . insulin glargine  20 Units Subcutaneous QHS  . melatonin  5 mg Oral QHS  . menthol-cetylpyridinium  1 lozenge Oral Daily  . multivitamin  1 tablet Oral QHS  . pantoprazole  40 mg Oral Daily  . polyethylene glycol  17 g Oral BID  . senna-docusate  2 tablet Oral BID  . sevelamer carbonate  4.8 g Oral TID WC  . umeclidinium bromide  1 puff Inhalation Daily    Dialysis Orders: TTS @  [place] 4.5h 400/800 126kg 2/2.25 bath Hep 12000bolusTDC -Mircera39mcg q 2wks - just reordered/not yet given,lastdose1/26/21 -Hectorol16mcg IV qHD  Assessment/Plan: 1. PAD with L leg ischemia + gangrene:X-ray w/o signs of osteomyelitis, on empiricVanc + Zosyn.VVS consulted-> underwent BLE arteriogram 8/6showingsevere bilateralPAD below the knees. 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 Donnetta Hutching. In CIR 2. ESRD: Continue HD per TTS schedule - next HD 8/19. 3. Hypotension/volume: Chronic low BP - Coregreduced. No volume overload on exam. EDW will be lower on discharge. Minimal UF with last HD but overall euvolemic on exam. 4. AnemiaofESRD:Hgb 7.1.Continue Aranesp 115mcgq Thurs.No IV iron due to allergy- started on PO iron BID instead (tsat 6%, ferritin 218). Likely transfuse 1U PRBC with next HD if Hgb remains same level. 5. Secondary  Hyperparathyroidism:CorrCa/Phos good.Continue VDRA, sensipar,and binders. 6. Constipation: Givenmag citrate + POdulcolax 8/8. 7. Nutrition- Renal diet w/fluid restrictions 8. DMT2- Per primary 9. CAD s/p CABG -Per primary 10. Hx CVA 11. COPD  Jonathon Bailey 05/12/2020, 9:20 AM  [employer]

## 2020-05-12 NOTE — Progress Notes (Signed)
Physical Therapy Session Note  Patient Details  Name: Jonathon Bailey MRN: [id number] Date of Birth: [birth year]  Today's Date: 05/12/2020 PT Individual Time: 8676-7209 PT Individual Time Calculation (min): 58 min   Short Term Goals: Week 1:  PT Short Term Goal 1 (Week 1): pt to demonstrate supine<>sit at min A x1 PT Short Term Goal 2 (Week 1): pt to demonstrate transfers with LRAD at min A x1 PT Short Term Goal 4 (Week 1): pt to initiate standing with RW PT Short Term Goal 5 (Week 1): pt to mobilize WC 150' min A  Skilled Therapeutic Interventions/Progress Updates:   Received pt sitting in WC, pt agreeable to therapy, and reported pain 7/10 in L residual limb. RN notified and administered pain medication at end of session. Session with emphasis on functional mobility/transfers, generalized strengthening, dynamic standing balance/coordination, and improved activity tolerance. Pt transported to therapy gym in [facility] total A for energy conservation purposes.  Pt transferred sit<>stand in // bars mod A and attempted to take hops forward however pt with poor bilateral UE strength and decreased R LE foot clearance. Pt able to lift heel off gound but unable to lift toes. Instead worked on Freeman by hopping in place inside // bars 2x5 reps with CGA/min A. Pt with continued difficulty and only able to lift heel. Pt transferred WC<>mat via lateral scoot min A with cues for anterior weight shifting and worked on dynamic sitting balance and core strength tossing ball against rebounder 3x20 with close supervision alternating using bilateral UEs and R UE only for increased challenge. Pt performed the following exercises sitting on mat with supervision and verbal cues for technique: -trunk rotations with 4.4lb medicine ball x10 bilaterally   -chest press at 90 degrees with 4.4lb medicine ball 2x10 -R LE hamstring curls with grn TB 2x12 -hip abduction with grn TB 2x10 Pt transferred sit<>stand with RW mod  A x 3 trials and performed the following exercises standing with bilateral UE support on RW and CGA: -L hip flexion 2x12 -L hip abduction x12 Pt transferred sit<>stand with RW x 5 reps with mod A fading to min A with cues for anterior weight shifting and pushing up through R LE.  Educated pt on strategies for hand placement on RW when standing by placing 1 UE on RW and 1 UE on mat to stand. Pt tried with L UE on mat and R UE on RW, however pt with increased difficulty trying to stand due to balance deficits and L BKA; switched to L UE on RW and R UE on mat and pt able to stand with as little as min A. Pt transported back to room in [facility] total A. Concluded session with pt sitting in WC, needs within reach, and seatbelt alarm on.   Therapy Documentation Precautions:  Precautions Precautions: Fall Precaution Comments: recent L BKA Restrictions Weight Bearing Restrictions: Yes LLE Weight Bearing: Non weight bearing   Therapy/Group: Individual Therapy Alfonse Alpers PT, DPT   05/12/2020, 7:24 AM

## 2020-05-12 NOTE — Plan of Care (Signed)
  Problem: Consults Goal: RH LIMB LOSS PATIENT EDUCATION Description: Description: See Patient Education module for eduction specifics. Outcome: Progressing   Problem: RH BOWEL ELIMINATION Goal: RH STG MANAGE BOWEL WITH ASSISTANCE Description: STG Manage Bowel with mod I Assistance. Outcome: Progressing Goal: RH STG MANAGE BOWEL W/MEDICATION W/ASSISTANCE Description: STG Manage Bowel with Medication with mod I Assistance. Outcome: Progressing   Problem: RH SKIN INTEGRITY Goal: RH STG ABLE TO PERFORM INCISION/WOUND CARE W/ASSISTANCE Description: STG Able To Perform Incision/Wound Care With mod I Assistance. Outcome: Progressing   Problem: RH SAFETY Goal: RH STG ADHERE TO SAFETY PRECAUTIONS W/ASSISTANCE/DEVICE Description: STG Adhere to Safety Precautions With cues and reminders  Outcome: Progressing   Problem: RH PAIN MANAGEMENT Goal: RH STG PAIN MANAGED AT OR BELOW PT'S PAIN GOAL Description: Pain level less than 5 on scale of 0-10 Outcome: Progressing   Problem: RH KNOWLEDGE DEFICIT LIMB LOSS Goal: RH STG INCREASE KNOWLEDGE OF SELF CARE AFTER LIMB LOSS Description: Pt will be able to adhere to medication regimen, dietary and lifestyle modification to prevent complications related to DM and ESRD with mod I assist using handouts and booklets provided.  Pt will be able to perform dressing changes and wrap stump site with mod I assist.  Outcome: Progressing

## 2020-05-12 NOTE — Progress Notes (Signed)
[place] PHYSICAL MEDICINE & REHABILITATION PROGRESS NOTE   Subjective/Complaints: Patient seen sitting up in his chair working with therapy this morning.  He states his slept fairly overnight.  He asks for his diet to be liberated, without restrictions.  Discussed with patient importance of diet compliance for nutrition as well as health.  Patient states he is "280 pounds of pure milk chocolate that needs to be fed".  ROS: Denies CP, SOB, N/V/D  Objective:   DG Abd 1 View  Result Date: 05/10/2020 CLINICAL DATA:  Abdominal distension, nausea and vomiting, constipation EXAM: ABDOMEN - 1 VIEW COMPARISON:  05/01/2020 FINDINGS: Two supine frontal views of the abdomen and pelvis are obtained, excluding portions of the right flank by collimation. Bowel gas pattern is unremarkable without obstruction or ileus. No masses or abnormal calcifications. Bony structures are unremarkable. IMPRESSION: 1. Unremarkable bowel gas pattern. Electronically Signed   By: Randa Ngo M.D.   On: 05/10/2020 20:58   Recent Labs    05/10/20 1521  WBC 12.9*  HGB 7.1*  HCT 25.0*  PLT 447*   Recent Labs    05/10/20 1521  NA 134*  K 4.3  CL 93*  CO2 25  GLUCOSE 130*  BUN 46*  CREATININE 10.71*  CALCIUM 8.0*    Intake/Output Summary (Last 24 hours) at 05/12/2020 1207 Last data filed at 05/12/2020 0700 Gross per 24 hour  Intake 920 ml  Output --  Net 920 ml     Physical Exam: Vital Signs Blood pressure 122/72, pulse 82, temperature 98.8 F (37.1 C), temperature source Oral, resp. rate 18, height 6' (1.829 m), weight 121.8 kg, SpO2 95 %. Constitutional: No distress . Vital signs reviewed.  Morbidly obese. HENT: Normocephalic.  Atraumatic. Eyes: EOMI. No discharge. Cardiovascular: No JVD. Respiratory: Normal effort.  No stridor. GI: Non-distended. Skin: Left BKA with dressing CDI Psych: Normal mood.  Normal behavior. Musc: Left BKA with improving edema and tenderness Neuro: Alert Motor:  Bilateral upper extremities: 5/5 proximal distal Left lower extremity: 4+/5 proximal distally Right lower extremity: Hip flexion 4/5 (pain inhibition), unchanged  Assessment/Plan: 1. Functional deficits secondary to left BKA which require 3+ hours per day of interdisciplinary therapy in a comprehensive inpatient rehab setting.  Physiatrist is providing close team supervision and 24 hour management of active medical problems listed below.  Physiatrist and rehab team continue to assess barriers to discharge/monitor patient progress toward functional and medical goals  Care Tool:  Bathing  Bathing activity did not occur: Refused Body parts bathed by patient: Right arm, Left arm, Chest, Abdomen, Front perineal area, Buttocks, Right upper leg, Left upper leg, Face   Body parts bathed by helper: Right lower leg Body parts n/a: Left lower leg   Bathing assist Assist Level: Minimal Assistance - Patient > 75%     Upper Body Dressing/Undressing Upper body dressing   What is the patient wearing?: Pull over shirt    Upper body assist Assist Level: Set up assist    Lower Body Dressing/Undressing Lower body dressing      What is the patient wearing?: Pants     Lower body assist Assist for lower body dressing: Maximal Assistance - Patient 25 - 49%     Toileting Toileting Toileting Activity did not occur (Clothing management and hygiene only): N/A (no void or bm)  Toileting assist Assist for toileting: Maximal Assistance - Patient 25 - 49%     Transfers Chair/bed transfer  Transfers assist     Chair/bed transfer assist  level: Contact Guard/Touching assist (lateral scoot)     Locomotion Ambulation   Ambulation assist   Ambulation activity did not occur: Safety/medical concerns          Walk 10 feet activity   Assist  Walk 10 feet activity did not occur: Safety/medical concerns        Walk 50 feet activity   Assist Walk 50 feet with 2 turns activity did not  occur: Safety/medical concerns         Walk 150 feet activity   Assist Walk 150 feet activity did not occur: Safety/medical concerns         Walk 10 feet on uneven surface  activity   Assist Walk 10 feet on uneven surfaces activity did not occur: Safety/medical concerns         Wheelchair     Assist Will patient use wheelchair at discharge?: Yes Type of Wheelchair: Manual    Wheelchair assist level: Supervision/Verbal cueing Max wheelchair distance: 142ft    Wheelchair 50 feet with 2 turns activity    Assist        Assist Level: Supervision/Verbal cueing   Wheelchair 150 feet activity     Assist      Assist Level: Moderate Assistance - Patient 50 - 74%   Blood pressure 122/72, pulse 82, temperature 98.8 F (37.1 C), temperature source Oral, resp. rate 18, height 6' (1.829 m), weight 121.8 kg, SpO2 95 %.    Medical Problem List and Plan: 1.  Limitations with mobility, transfers, endurance, self-care secondary to left BKA.  Continue CIR  Will consider shrinker 2.  Antithrombotics: -DVT/anticoagulation:  Pharmaceutical: Heparin             -antiplatelet therapy: ASA daily  3. Pain Management: Oxycodone as needed   Controlled with meds on 8/19             Monitor with increased exertion. 4. Mood: Team support             -antipsychotic agents: N/a 5. Neuropsych: This patient is capable of making decisions on his own behalf. 6. Skin/Wound Care: Daily dressing changes. Monitor wound for healing.  Vitamin C for healing.  Prosource added to promote wound healing.  7. Fluids/Electrolytes/Nutrition: Monitor I/O.  Modified/renal diet with 1200 ccFR/day. Labs monitored with HD.  Patient would like to be on an unrestricted regular diet, however given his morbid obesity, diabetes, heart disease, vascular disease, dialysis, etc. do not believe this is advisable.  Will continue to attempt to educate patient. 8.  T2DM with hyperglycemia:  Hgb A1c- 7.9.   Continue Lantus 20 units daily (On levemir 75 mg bid/Novolog 5 untis bid PTA- reports eats better at home). Continue to monitor BS ac/hs and use SSI for elevated blood sugars.  Slightly elevated on 8/19  Monitor with increased mobility 9.  ESRD: Schedule hemodialysis at the end of the day on TTS to help with tolerance of activity.  Metabolic bone disease managed with Hectorol and Renvela. 10.  COPD: Respiratory status stable on Incruse daily. With congestion- CXR shows atelectasis. Encourage use of IS.  11.  Anemia of chronic disease: Serial CBCs with hemodialysis.  On IV Aranesp weekly with Wynonia Lawman twice daily.  Hemoglobin 7.1 on 8/17  Continue to monitor 12.  History of CAD: Carvedilol decreased due to hypotension --continue Coreg 3.125 mg bid with Lipitor and ASA. 13.  Hypertension  See #12  Controlled on 8/19  Monitor with increased mobility. 14.  Leukocytosis  WBC 12.9 on 8/17  Afebrile  Continue to monitor 15.  Drug-induced constipation  Abdominal x-ray unremarkable  Bowel meds increased on 8/18  LOS: 6 days A FACE TO FACE EVALUATION WAS PERFORMED  Maite Burlison Lorie Phenix 05/12/2020, 12:07 PM

## 2020-05-12 NOTE — Progress Notes (Signed)
Occupational Therapy Session Note  Patient Details  Name: Jonathon Bailey MRN: [id number] Date of Birth: 03/28/1956  Today's Date: 05/12/2020 OT Individual Time: 8270-7867 OT Individual Time Calculation (min): 70 min    Short Term Goals: Week 1:  OT Short Term Goal 1 (Week 1): Pt will trf with LRAD  to w/c in prep for toileting with MOD A OT Short Term Goal 2 (Week 1): Pt will thread BLE into pants with no more than CGA for sitting balance OT Short Term Goal 3 (Week 1): Pt will lean laterally EOB/EOM with S in prep for toileting  Skilled Therapeutic Interventions/Progress Updates:    Pt resting in w/c upon arrival.  Pt commented that he was "exhausted" from two earlier therapy sessions but was willing to do what he could.  OT intervention with focus on w/c mobility, BUE therex, functional transfers, and UB strengthening/endurance. Pt propelled w/c to nursing station with rest breakX 1. Pt's w/c has significant pull to L and difficult to propel.  Pt engaged in BUE therex on SciFit Arm Bike 6 minX2 load 2. Pt also engaged in BUE therex with 5# bar-overhead presses 10x3, chest presses 10x3, and straight arm raises 10x3. Pt transitioned to Day Room and engaged in Wii boxing x 2 games. Pt required rest breaks between each round. Pt returned to room and performed scoot transfer to bed with CGA. Pt remained in bed with all needs within reach and bed alarm activated.   Therapy Documentation Precautions:  Precautions Precautions: Fall Precaution Comments: recent L BKA Restrictions Weight Bearing Restrictions: Yes LLE Weight Bearing: Non weight bearing Pain: Pt c/o 6/10 phantom pain in LLE; repositioned   Therapy/Group: Individual Therapy  Leroy Libman 05/12/2020, 12:13 PM

## 2020-05-12 NOTE — Progress Notes (Signed)
Occupational Therapy Session Note  Patient Details  Name: Jonathon Bailey MRN: [id number] Date of Birth: 11-Jan-1956  Today's Date: 05/12/2020 OT Individual Time: 0700-0756 OT Individual Time Calculation (min): 56 min    Short Term Goals: Week 1:  OT Short Term Goal 1 (Week 1): Pt will trf with LRAD  to w/c in prep for toileting with MOD A OT Short Term Goal 2 (Week 1): Pt will thread BLE into pants with no more than CGA for sitting balance OT Short Term Goal 3 (Week 1): Pt will lean laterally EOB/EOM with S in prep for toileting  Skilled Therapeutic Interventions/Progress Updates:    1:1. Pt receivd in bed with 4/10 pain in residual limb. Pt finishing breakfast upon entering room. Pt completes lateral scoot transfer to w/c with set u of board and S with VC for ant weigth shift. Pt completes grooming at sink with set up. Pt declines bathing and dressing this date. Pt propels w/c to/from all tx destinations with significantly increased time and Vc for steering for tight turns. Pt completes 4x1 min beach ball volley iwht 5# dowel rod for BUE strengthening, endurance and vision in prep for BADLs and functional transfers. Pt resting in w/c with belt alarm on, call light tin reach and all needs met at end of session as OT exits   Therapy Documentation Precautions:  Precautions Precautions: Fall Precaution Comments: recent L BKA Restrictions Weight Bearing Restrictions: Yes LLE Weight Bearing: Non weight bearing General:   Vital Signs: Therapy Vitals Temp: 98.8 F (37.1 C) Temp Source: Oral Pulse Rate: 82 Resp: 18 BP: 122/72 Patient Position (if appropriate): Sitting Oxygen Therapy SpO2: 95 % O2 Device: Room Air Pain: Pain Assessment Pain Score: 6  ADL:   Vision   Perception    Praxis   Exercises:   Other Treatments:     Therapy/Group: Individual Therapy  Tonny Branch 05/12/2020, 7:03 AM

## 2020-05-12 NOTE — Consult Note (Signed)
Neuropsychological Consultation   Patient:   Jonathon Bailey   DOB:   25-Dec-1955  MR Number:  [id number]  Location:  [facility] [place] [id number] [place] [address] Dept: [place]: [phone]           Date of Service:   05/11/2020  Start Time:   3 PM End Time:   4 PM  Provider/Observer:  Ilean Skill, Psy.D.       Clinical Neuropsychologist       Billing Code/Service: 70623  Chief Complaint:    Jonathon Bailey is a 64 year old male with CAD/CABG, macular degeneration, end-stage renal disease, type 2 diabetes.  The patient was admitted on 04/27/2020 with sepsis due to wet gangrene left foot.  Patient was initially started on IV antibiotics with attempts at limb salvage.  Unfavorable anatomy for revascularization attempts were noted and BKA was recommended due to concern of wound healing.  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  Patient had issues with hypotension at 1 point with hospital course further complicated by acute on chronic anemia and inability to tolerate IV iron due to allergy.  Patient dealing with debility as well as limitations due to left BKA.  Reason for Service:  Patient was referred for neuropsychological consult due to some anxiety and stress over his significant overall medical status and recent BKA and development of debility.  Below is the HPI for the current admission.  HPI: Jonathon Bailey is a 64 year old male with history of CAD s/p CABG, macular degeneration, ESRD-HD TTS, T2DM who was admitted 04/27/2020 with sepsis due to wet gangrene left foot.  History taken from chart review and patient.  He was started on IV antibiotics with attempts at limb salvage.  Patient with unfavorable anatomy for revascularization attempts and BKA recommended due to concerns of wound healing.  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   Postop reported visual changes and CT head done which was unremarkable for acute intracranial process.  He has had issues with hypotension and carvedilol was held briefly then resumed at lower dose.  Bowel program has been augmented to manage constipation.  Hospital course further complicated by acute on chronic anemia managed with increasing Aranesp dose--unable to tolerate IV iron due to allergy.  Therapy evaluations completed revealing DOE as well as tachycardia due to debility as well as limitations due to left BKA.  CIR was recommended due to functional decline.  Please see preadmission assessment from earlier today as well.  Current Status:  The patient did acknowledge struggling with the speed with which his situation deteriorated medically.  The patient has struggled with losing his lower leg and is struggled with worrying and anxiety around his potential for being able to effectively utilize a prosthetic going forward.  He is expecting and anticipating significant changes in his overall life functioning ability to engage in activities..   Behavioral Observation: Jonathon Bailey  presents as a 64 y.o.-year-old Right African American Male who appeared his stated age. his dress was Appropriate and he was Well Groomed and his manners were Appropriate to the situation.  his participation was indicative of Appropriate and Redirectable behaviors.  There were any physical disabilities noted.  he displayed an appropriate level of cooperation and motivation.     Interactions:    Active Appropriate and Redirectable  Attention:   abnormal and attention span appeared shorter than expected  for age  Memory:   within normal limits; recent and remote memory intact  Visuo-spatial:  not examined  Speech (Volume):  low  Speech:   normal; normal  Thought Process:  Coherent and Relevant  Though Content:  WNL; not suicidal and not homicidal  Orientation:   person, place, time/date and  situation  Judgment:   Fair  Planning:   Fair  Affect:    Blunted, Flat and Lethargic  Mood:    Dysphoric  Insight:   Good  Intelligence:   normal   Medical History:   Past Medical History:  Diagnosis Date  . Anxiety   . Arthritis   . Asthma    "as a child"  . CAD (coronary artery disease)   . Chronic systolic heart failure ([place]) 09/21/2008   ECHO Feb 2013 showed LVEF low normal at 50-55%, +hypokinetic anterolateral wall and inferolateral wall.    . CKD (chronic kidney disease) stage 4, GFR 15-29 ml/min (HCC) 08/30/2009   Progressive renal failure since 2008, creatinine 1.2 in 2008 up to 3.5 in 2012 and 3.2-5.0 in 2013. All UA's 2011-13 showed >300 protein on dipstick. Work-up in May 2011 showed negative Urine IFE and SPEP, ultrasound showed 12-13 cm kidneys with increased echogenicity and UPC ratio was 1.5 gm proteinuria.  Hgb A1C's from 2011 to 2013 were all between 9-11.  Patient saw Dr. Donnetta Hutching (vasc surgery) for HD access in Aug 2013 > vein mapping was done and Dr. Donnetta Hutching felt the left arm (pt is R handed) was suitable for L arm Cimino radiocephalic fistula. Patient said he wasn't ready to consider doing dialysis and declined the surgery.     . Depression   . Diabetic retinopathy ([place])   . ESRD (end stage renal disease) on dialysis ([place])   . YDXAJOIN(867.6)    "maybe monthly" (03/15/2014)  . Hyperlipidemia   . Hypertension   . Macular edema   . Myocardial infarction [facility])    status post MI x2 and 3 stents placed in 2003  . Obesity   . Sleep apnea    does not use CPAP  . Stroke [facility]) ~ 2007; ~1987   "weak on right side; messed w/right side of brain; cry all the time"  . Type II diabetes mellitus (HCC)    insulin dependent   Psychiatric History:  The patient does have a prior history of depression and some anxiety and acknowledges that his depressive symptomatology have been worsening with his deteriorating medical status.  The patient reports that he is already on numerous  medications and is concerned about other medication specifically for depression with the complexity to his medication regimen currently.  He denies that his level of depression is keeping him from actively participating in the therapeutic efforts and we reviewed issues of what to watch out for if his depressive symptoms worsen.  Family Med/Psych History:  Family History  Problem Relation Age of Onset  . Asthma Mother   . Hyperlipidemia Mother   . Hypertension Mother   . Stroke Father   . Heart attack Father   . Prostate cancer Father   . Deep vein thrombosis Father   . Cancer Father   . Diabetes Father   . Hyperlipidemia Father   . Hypertension Father   . Other Father        varicose veins  . Heart disease Father        before age 31  . Other Sister        varicose  veins   Impression/DX:  Jonathon Bailey is a 64 year old male with CAD/CABG, macular degeneration, end-stage renal disease, type 2 diabetes.  The patient was admitted on 04/27/2020 with sepsis due to wet gangrene left foot.  Patient was initially started on IV antibiotics with attempts at limb salvage.  Unfavorable anatomy for revascularization attempts were noted and BKA was recommended due to concern of wound healing.  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  Patient had issues with hypotension at 1 point with hospital course further complicated by acute on chronic anemia and inability to tolerate IV iron due to allergy.  Patient dealing with debility as well as limitations due to left BKA.  The patient did acknowledge struggling with the speed with which his situation deteriorated medically.  The patient has struggled with losing his lower leg and is struggled with worrying and anxiety around his potential for being able to effectively utilize a prosthetic going forward.  He is expecting and anticipating significant changes in his overall life functioning ability to engage in activities.  The patient does  have a prior history of depression and some anxiety and acknowledges that his depressive symptomatology have been worsening with his deteriorating medical status.  The patient reports that he is already on numerous medications and is concerned about other medication specifically for depression with the complexity to his medication regimen currently.  He denies that his level of depression is keeping him from actively participating in the therapeutic efforts and we reviewed issues of what to watch out for if his depressive symptoms worsen.  Disposition/Plan:  I will follow up with the patient if needed going forward.  We reviewed and worked on coping issues around the patient's overall status as well as specific issues related to his recent BKA.          Electronically Signed   _______________________ Ilean Skill, Psy.D.

## 2020-05-13 ENCOUNTER — Inpatient Hospital Stay (HOSPITAL_COMMUNITY): Payer: No Typology Code available for payment source

## 2020-05-13 ENCOUNTER — Inpatient Hospital Stay (HOSPITAL_COMMUNITY): Payer: No Typology Code available for payment source | Admitting: Occupational Therapy

## 2020-05-13 DIAGNOSIS — K5900 Constipation, unspecified: Secondary | ICD-10-CM

## 2020-05-13 DIAGNOSIS — Z9119 Patient's noncompliance with other medical treatment and regimen: Secondary | ICD-10-CM

## 2020-05-13 DIAGNOSIS — Z91199 Patient's noncompliance with other medical treatment and regimen due to unspecified reason: Secondary | ICD-10-CM

## 2020-05-13 LAB — GLUCOSE, CAPILLARY
Glucose-Capillary: 90 mg/dL (ref 70–99)
Glucose-Capillary: 150 mg/dL — ABNORMAL HIGH (ref 70–99)
Glucose-Capillary: 114 mg/dL — ABNORMAL HIGH (ref 70–99)
Glucose-Capillary: 141 mg/dL — ABNORMAL HIGH (ref 70–99)

## 2020-05-13 NOTE — Progress Notes (Signed)
Patient ID: Jonathon Bailey, male   DOB: 04/01/1956, 64 y.o.   MRN: [id number] Met with the patient to review nurse CM collaboration with the SW to facilitate discharge preparations and assure that the patient feels concerns have been addressed. Discussed patient is feeling that the entire process of having to come in to have his foot checked to amputation to discharge home is occurring at an accelerated rate for the patient. He notes that he felt dismissed when he requested a second opinion prior to the amputation surgery even though he understood that the limb was not salvageable. Reviewed handbooks given on kidney disease and diabetes management and handouts on stump and prosthesis care and living with an amputation. Patient reported he have reviewed the information and did not have any questions at this time. Sharp, Deborah B  

## 2020-05-13 NOTE — Progress Notes (Signed)
Physical Therapy Session Note  Patient Details  Name: Jonathon Bailey MRN: [id number] Date of Birth: 18-Oct-1955  Today's Date: 05/13/2020 PT Individual Time: 1000-1057 and 1300-1357 PT Individual Time Calculation (min): 57 min and 57 min  Short Term Goals: Week 1:  PT Short Term Goal 1 (Week 1): pt to demonstrate supine<>sit at min A x1 PT Short Term Goal 1 - Progress (Week 1): Met PT Short Term Goal 2 (Week 1): pt to demonstrate transfers with LRAD at min A x1 PT Short Term Goal 2 - Progress (Week 1): Met PT Short Term Goal 4 (Week 1): pt to initiate standing with RW PT Short Term Goal 4 - Progress (Week 1): Met PT Short Term Goal 5 (Week 1): pt to mobilize WC 150' min A PT Short Term Goal 5 - Progress (Week 1): Progressing toward goal Week 2:  PT Short Term Goal 1 (Week 2): Pt will transfer bed<>chair with LRAD CGA PT Short Term Goal 2 (Week 2): Pt will transfer sit<>stand with LRAD CGA PT Short Term Goal 3 (Week 2): Pt will initiate gait training  Skilled Therapeutic Interventions/Progress Updates:  Treatment Session 1: 6568-1275 57 min Received pt sitting EOB, pt agreeable to therapy, and reported pain 7/10 in L residual limb. RN notified and administered pain medication during session. Repositioning, rest breaks, and distraction done to reduce pain levels. Session with emphasis on functional mobility/transfers, generalized strengthening, simulated car transfers, dynamic sitting/standing balance/coordination, and improved activity tolerance. Pt transferred bed<>WC stand<>pivot with RW and mod A with cues for pivoting on R LE and for hand placement on RW and on bed. Pt transported to ortho gym in [facility] total A for energy conservation purposes and performed stand<>pivot transfer with RW mod A +2 into car. Pt with increased difficulty clearing R LE when pivoting and hopping and with increased fatigue resulting in pt sitting down prior to backing up to car completely despite cues from  therapist. Therapist educated pt on safety techniques and strategies for entering/exiting the car. Therapist switched out current WC for different 20x18 WC to improve with steering. Pt exited car via lateral scoot with min A due to increased UE fatigue and soreness. Therapist attempted to educate pt on transfer techniques however pt stated "just let me do it my way." Pt transported to dayroom in [facility] total A and transferred sit<>stand with RW x 4 trials and mod/max A. While standing worked on dynamic standing balance bowling with R UE x 4 trials with min A for balance with rest breaks after each trial. Pt requested to perform 1 round of bowling while seated in WC with emphasis on core control and reaching outside BOS. Pt performed WC mobility 21f using bilateral UEs and supervision back to room; [facility]. Pt requested to sit in bed and transferred WC<>bed via lateral scoot with CGA. Concluded session with pt sitting EOB, needs within reach, and bed alarm on. Therapist provided fresh drink for pt.   Treatment Session 2: [id number] min Received pt sitting EOB, pt agreeable to therapy, and reported pain 5/10 in L residual limb. RN notified but stated pt not due for more pain medicine until 2 pm; pt agreeable to wait. Repositioning, rest breaks, and distraction done to reduce pain levels. Session with emphasis on functional mobility/transfers, community navigation, generalized strengthening, dynamic sitting/standing balance/coordination, and improved activity tolerance. Pt transferred bed<>WC via lateral scoot with CGA and [facility]. Pt performed WC mobility 25f on uneven surfaces (concrete) and uphill/downhill with close supervision and cues to increase propulsion stride length. Pt suddenly realized that his wallet was not in the bag on the back of his wheelchair after switching chairs this morning. [facility]  [facility] and therapist located pt's wallet. Pt assisted therapist in locating wallet by propelling WC throughout hospital room and through narrow corners and spaces and able to safely reach outside BOS to look through drawers/furniture. Pt transferred WC<>bed stand<>pivot with RW and mod A with cues for hand placement on RW and WC armrests. Concluded session with pt sitting EOB, needs within reach, and bed alarm on.   Therapy Documentation Precautions:  Precautions Precautions: Fall Precaution Comments: recent L BKA Restrictions Weight Bearing Restrictions: Yes LLE Weight Bearing: Non weight bearing   Therapy/Group: Individual Therapy AAlfonse AlpersPT, DPT   05/13/2020, 7:36 AM

## 2020-05-13 NOTE — Progress Notes (Signed)
Patient ID: Jonathon Bailey, male   DOB: 1955-12-13, 64 y.o.   MRN: [id number] Comfortable.  Incisional pain in left stump improving  Reports that he is able to transfer from bed to chair and is progressing well in rehab  Dressing removed.  Some slight serosanguineous drainage on the dressing.  Wounds all look great.  No evidence of skin breakdown.  Does not need to change dressing for several days.  I will check again first of the week.  The patient is requesting some liberalization of his renal diet.  I feel that this is appropriate given his current situation.  I will check with nephrology to see if this is possible.

## 2020-05-13 NOTE — Progress Notes (Signed)
Jonathon Bailey PHYSICAL MEDICINE & REHABILITATION PROGRESS NOTE   Subjective/Complaints: Patient seen sitting up in his chair this morning, working with therapies.  He states he slept fairly overnight.  He is upset that his diet was not changed.  Attempted to educate again on diet, rationale and resulting complications due to noncompliance.  Patient states that "I will eat what I want".  ROS: Denies CP, SOB, N/V/D  Objective:   No results found. Recent Labs    05/10/20 1521 05/12/20 1444  WBC 12.9* 12.1*  HGB 7.1* 7.2*  HCT 25.0* 25.5*  PLT 447* 404*   Recent Labs    05/10/20 1521 05/12/20 1444  NA 134* 130*  K 4.3 4.0  CL 93* 92*  CO2 25 26  GLUCOSE 130* 122*  BUN 46* 38*  CREATININE 10.71* 9.77*  CALCIUM 8.0* 8.2*    Intake/Output Summary (Last 24 hours) at 05/13/2020 0956 Last data filed at 05/12/2020 2200 Gross per 24 hour  Intake 600 ml  Output 1500 ml  Net -900 ml     Physical Exam: Vital Signs Blood pressure 100/62, pulse 82, temperature 98.5 F (36.9 C), resp. rate 18, height 6' (1.829 m), weight 122 kg, SpO2 97 %. Constitutional: No distress . Vital signs reviewed. Morbidly obese.  HENT: Normocephalic.  Atraumatic. Eyes: EOMI. No discharge. Cardiovascular: No JVD. Respiratory: Normal effort.  No stridor. GI: Non-distended. Skin: Left BKA with dressing CDI Psych: Normal mood.  Normal behavior. Musc: Left BKA with improving edema and tenderness Neuro: Alert Motor: Bilateral upper extremities: 5/5 proximal distal Left lower extremity: 4+/5 proximal distally Right lower extremity: Hip flexion 4/5 (pain inhibition), improving  Assessment/Plan: 1. Functional deficits secondary to left BKA which require 3+ hours per day of interdisciplinary therapy in a comprehensive inpatient rehab setting.  Physiatrist is providing close team supervision and 24 hour management of active medical problems listed below.  Physiatrist and rehab team continue to assess  barriers to discharge/monitor patient progress toward functional and medical goals  Care Tool:  Bathing  Bathing activity did not occur: Refused Body parts bathed by patient: Right arm, Left arm, Chest, Abdomen, Front perineal area, Buttocks, Right upper leg, Left upper leg, Face   Body parts bathed by helper: Right lower leg Body parts n/a: Left lower leg   Bathing assist Assist Level: Minimal Assistance - Patient > 75%     Upper Body Dressing/Undressing Upper body dressing   What is the patient wearing?: Pull over shirt    Upper body assist Assist Level: Set up assist    Lower Body Dressing/Undressing Lower body dressing      What is the patient wearing?: Pants     Lower body assist Assist for lower body dressing: Maximal Assistance - Patient 25 - 49%     Toileting Toileting Toileting Activity did not occur (Clothing management and hygiene only): N/A (no void or bm)  Toileting assist Assist for toileting: Maximal Assistance - Patient 25 - 49%     Transfers Chair/bed transfer  Transfers assist     Chair/bed transfer assist level: Contact Guard/Touching assist (lateral scoot)     Locomotion Ambulation   Ambulation assist   Ambulation activity did not occur: Safety/medical concerns          Walk 10 feet activity   Assist  Walk 10 feet activity did not occur: Safety/medical concerns        Walk 50 feet activity   Assist Walk 50 feet with 2 turns activity did not occur:  Safety/medical concerns         Walk 150 feet activity   Assist Walk 150 feet activity did not occur: Safety/medical concerns         Walk 10 feet on uneven surface  activity   Assist Walk 10 feet on uneven surfaces activity did not occur: Safety/medical concerns         Wheelchair     Assist Will patient use wheelchair at discharge?: Yes Type of Wheelchair: Manual    Wheelchair assist level: Supervision/Verbal cueing Max wheelchair distance: 139ft     Wheelchair 50 feet with 2 turns activity    Assist        Assist Level: Supervision/Verbal cueing   Wheelchair 150 feet activity     Assist      Assist Level: Moderate Assistance - Patient 50 - 74%   Blood pressure 100/62, pulse 82, temperature 98.5 F (36.9 C), resp. rate 18, height 6' (1.829 m), weight 122 kg, SpO2 97 %.    Medical Problem List and Plan: 1.  Limitations with mobility, transfers, endurance, self-care secondary to left BKA.  Continue CIR  Plan to order stump shrinker next week 2.  Antithrombotics: -DVT/anticoagulation:  Pharmaceutical: Heparin             -antiplatelet therapy: ASA daily  3. Pain Management: Oxycodone as needed   Controlled with meds on 8/20             Monitor with increased exertion. 4. Mood: Team support             -antipsychotic agents: N/a 5. Neuropsych: This patient is capable of making decisions on his own behalf.  Discussed with Neuropsych, appreciate recs 6. Skin/Wound Care: Daily dressing changes. Monitor wound for healing.  Vitamin C for healing.  Prosource added to promote wound healing.  7. Fluids/Electrolytes/Nutrition: Monitor I/O.  Modified/renal diet with 1200 ccFR/day. Labs monitored with HD.  Patient would like to be on an unrestricted regular diet, however given his morbid obesity, diabetes, heart disease, vascular disease, dialysis, etc. do not believe this is advisable.  Discussed again with patient on 8/20 medical comorbidities related to noncompliance and potential further complexities, patient remains adamant. 8.  T2DM with hyperglycemia:  Hgb A1c- 7.9.  Continue Lantus 20 units daily (On levemir 75 mg bid/Novolog 5 untis bid PTA- reports eats better at home). Continue to monitor BS ac/hs and use SSI for elevated blood sugars.  Slightly labile on 8/20  Monitor with increased mobility 9.  ESRD: Schedule hemodialysis at the end of the day on TTS to help with tolerance of activity.  Metabolic bone disease  managed with Hectorol and Renvela. 10.  COPD: Respiratory status stable on Incruse daily. With congestion- CXR shows atelectasis. Encourage use of IS.  11.  Anemia of chronic disease: Serial CBCs with hemodialysis.  On IV Aranesp weekly with Wynonia Lawman twice daily.  Hemoglobin 7.2 on 8/19  Continue to monitor 12.  History of CAD: Carvedilol decreased due to hypotension --continue Coreg 3.125 mg bid with Lipitor and ASA. 13.  Hypertension  See #12  Controlled on 8/20             Monitor with increased mobility. 14.  Leukocytosis  WBC 12.1 on 8/19  Afebrile  Continue to monitor 15.  Drug-induced constipation  Abdominal x-ray unremarkable  Bowel meds increased on 8/18  Patient noncompliant with bowel meds  LOS: 7 days A FACE TO FACE EVALUATION WAS PERFORMED  Danner Paulding Lorie Phenix 05/13/2020, 9:56  AM  

## 2020-05-13 NOTE — Progress Notes (Signed)
Patient ID: Jonathon Bailey, male   DOB: 1955/12/02, 65 y.o.   MRN: [id number]  Met with pt to discuss plan and if he had spoken with his friends, he will this weekend. He feels he is making progress in his therapies. Will see Monday to see which facilities he wants worker to pursue.

## 2020-05-13 NOTE — Progress Notes (Signed)
Physical Therapy Weekly Progress Note  Patient Details  Name: Jonathon Bailey MRN: [id number] Date of Birth: Jul 30, 1956  Beginning of progress report period: May 07, 2020 End of progress report period: May 13, 2020  Patient has met 3 of 4 short term goals. Pt is currently making steady progress towards long term goals. Pt currently requires CGA/supervision for bed mobility from a flat bed, CGA/min A for lateral scoot transfers, mod A overall for sit<>stand transfers with RW, and supervision for WC mobility up to 156ft. Pt attempted hopping inside parallel bars however due to body habitus and poor UE strength, pt was unable to clear R LE from ground. Pt continues to be limited by phantom limb pain, decreased standing balance, and decreased strength/endurance. Additional barriers to D/C include limited caregiver support at home; pt is considering SNF as an option upon D/C.   Patient continues to demonstrate the following deficits muscle weakness, decreased cardiorespiratoy endurance and decreased sitting balance, decreased standing balance, decreased postural control, decreased balance strategies and difficulty maintaining precautions and therefore will continue to benefit from skilled PT intervention to increase functional independence with mobility.  Patient progressing toward long term goals..  Continue plan of care.  PT Short Term Goals Week 1:  PT Short Term Goal 1 (Week 1): pt to demonstrate supine<>sit at min A x1 PT Short Term Goal 1 - Progress (Week 1): Met PT Short Term Goal 2 (Week 1): pt to demonstrate transfers with LRAD at min A x1 PT Short Term Goal 2 - Progress (Week 1): Met PT Short Term Goal 4 (Week 1): pt to initiate standing with RW PT Short Term Goal 4 - Progress (Week 1): Met PT Short Term Goal 5 (Week 1): pt to mobilize WC 150' min A PT Short Term Goal 5 - Progress (Week 1): Progressing toward goal Week 2:  PT Short Term Goal 1 (Week 2): Pt will transfer  bed<>chair with LRAD CGA PT Short Term Goal 2 (Week 2): Pt will transfer sit<>stand with LRAD CGA PT Short Term Goal 3 (Week 2): Pt will initiate gait training  Skilled Therapeutic Interventions/Progress Updates:  Community reintegration;Discharge planning;Disease management/prevention;Balance/vestibular training;DME/adaptive equipment instruction;Functional mobility training;Neuromuscular re-education;Psychosocial support;UE/LE Strength taining/ROM;Wheelchair propulsion/positioning;UE/LE Coordination activities;Therapeutic Activities;Skin care/wound management;Pain management;Patient/family education;Splinting/orthotics;Therapeutic Exercise   Therapy Documentation Precautions:  Precautions Precautions: Fall Precaution Comments: recent L BKA Restrictions Weight Bearing Restrictions: Yes LLE Weight Bearing: Non weight bearing  Alfonse Alpers PT, DPT  05/13/2020, 7:34 AM

## 2020-05-13 NOTE — Progress Notes (Signed)
[facility] Progress Note   Subjective:  Seen in room. Total uf yesterday 1.5L. no complaints.   Objective Vitals:   05/12/20 1735 05/12/20 1946 05/13/20 0537 05/13/20 0907  BP: 101/65 98/60 100/62   Pulse: 85 87 82   Resp: 18 17 18    Temp: 99.9 F (37.7 C) 99.2 F (37.3 C) 98.5 F (36.9 C)   TempSrc: Oral Oral    SpO2: 94%  91% 97%  Weight:      Height:       Physical Exam General: Well appearing man, NAD Heart: RRR; no murmur Lungs: normal wob, bl chest expansion Abdomen: soft, non-tender Extremities: No RLE edema, L BKA bandaged Dialysis Access:  TDC in L chest: c/d/i  Additional Objective Labs: Basic Metabolic Panel: Recent Labs  Lab 05/08/20 1149 05/10/20 1521 05/12/20 1444  NA 131* 134* 130*  K 4.9 4.3 4.0  CL 92* 93* 92*  CO2 23 25 26   GLUCOSE 123* 130* 122*  BUN 53* 46* 38*  CREATININE 12.34* 10.71* 9.77*  CALCIUM 8.1* 8.0* 8.2*  PHOS 6.3* 3.8 4.2   Liver Function Tests: Recent Labs  Lab 05/08/20 1149 05/10/20 1521 05/12/20 1444  ALBUMIN 2.1* 2.2* 2.2*   CBC: Recent Labs  Lab 05/08/20 1148 05/10/20 1521 05/12/20 1444  WBC 13.7* 12.9* 12.1*  HGB 7.4* 7.1* 7.2*  HCT 25.3* 25.0* 25.5*  MCV 90.0 91.6 91.4  PLT 478* 447* 404*   CBG: Recent Labs  Lab 05/12/20 0553 05/12/20 1159 05/12/20 1736 05/12/20 2104 05/13/20 0549  GLUCAP 98 125* 87 159* 90   Studies/Results: No results found. Medications:   vitamin C  1,000 mg Oral BID   aspirin  81 mg Oral QODAY   atorvastatin  40 mg Oral QHS   carvedilol  3.125 mg Oral BID WC   cetaphil  1 application Topical Daily   Chlorhexidine Gluconate Cloth  6 each Topical Q0600   cinacalcet  90 mg Oral Q supper   darbepoetin (ARANESP) injection - DIALYSIS  100 mcg Intravenous Q Thu-HD   doxercalciferol  6 mcg Intravenous Q T,Th,Sa-HD   ferrous gluconate  324 mg Oral BID WC   heparin  5,000 Units Subcutaneous Q8H   insulin aspart  0-15 Units Subcutaneous TID WC    insulin glargine  20 Units Subcutaneous QHS   melatonin  5 mg Oral QHS   menthol-cetylpyridinium  1 lozenge Oral Daily   multivitamin  1 tablet Oral QHS   pantoprazole  40 mg Oral Daily   polyethylene glycol  17 g Oral BID   senna-docusate  2 tablet Oral BID   sevelamer carbonate  4.8 g Oral TID WC   umeclidinium bromide  1 puff Inhalation Daily    Dialysis Orders: TTS @ [place] 4.5h 400/800 126kg 2/2.25 bath Hep 12000bolusTDC -Mircera59mcg q 2wks - just reordered/not yet given,lastdose1/26/21 -Hectorol40mcg IV qHD  Assessment/Plan: 1. PAD with L leg ischemia + gangrene:X-ray w/o signs of osteomyelitis, on empiricVanc + Zosyn.VVS consulted-> underwent BLE arteriogram 8/6showingsevere bilateralPAD below the knees. 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 Donnetta Hutching. In CIR 2. ESRD: Continue HD per TTS schedule - next HD 8/21 3. Hypotension/volume: Chronic low BP - Coregreduced. No volume overload on exam. EDW will be lower on discharge. Minimal UF with last HD but overall euvolemic on exam. 4. AnemiaofESRD:Hgb 7.2.Continue Aranesp 11mcgq Thurs.No IV iron due to allergy- started on PO iron BID instead (tsat 6%, ferritin 218). 5. Secondary Hyperparathyroidism:CorrCa/Phos good.Continue VDRA, sensipar,and binders. 6. Constipation: Givenmag citrate +  POdulcolax 8/8. 7. Nutrition- Renal diet w/fluid restrictions 8. DMT2- Per primary 9. CAD s/p CABG -Per primary 10. Hx CVA 11. COPD  Gean Quint, MD [facility] 05/13/2020, 10:39 AM

## 2020-05-13 NOTE — Progress Notes (Signed)
Occupational Therapy Weekly Progress Note  Patient Details  Name: Jonathon Bailey MRN: [id number] Date of Birth: 03-08-1956  Beginning of progress report period: May 07, 2020 End of progress report period: May 13, 2020  Today's Date: 05/13/2020 OT Individual Time: 3299-2426 OT Individual Time Calculation (min): 70 min    Patient has met 2 of 3 short term goals.  Pt is making steady progress towards goals.  Pt currently requires min A to CGA for lateral scoot transfers with or without slide board.  Pt requires min-mod assist for sit > stand with heavy reliance on RW and then requires CGA to Min assist for dynamic standing balance.  Pt demonstrates limited participation in self-care tasks, therefore continues to require max assist for LB dressing due to decreased time spent addressing LB dressing tasks.  Pt is demonstrating improved UB strength as needed for boosting and for lateral scoot transfers but continues to be limited by decreased standing tolerance and difficulty with "hopping" for stand pivot transfers.  Patient continues to demonstrate the following deficits: muscle weakness, decreased cardiorespiratoy endurance, decreased attention, decreased awareness, decreased problem solving, decreased safety awareness and decreased memory and decreased sitting balance, decreased standing balance, decreased postural control, decreased balance strategies and difficulty maintaining precautions and therefore will continue to benefit from skilled OT intervention to enhance overall performance with BADL and Reduce care partner burden.  Patient progressing toward long term goals..  Continue plan of care.  OT Short Term Goals Week 1:  OT Short Term Goal 1 (Week 1): Pt will trf with LRAD  to w/c in prep for toileting with MOD A OT Short Term Goal 1 - Progress (Week 1): Met OT Short Term Goal 2 (Week 1): Pt will thread BLE into pants with no more than CGA for sitting balance OT Short Term Goal 2 -  Progress (Week 1): Progressing toward goal OT Short Term Goal 3 (Week 1): Pt will lean laterally EOB/EOM with S in prep for toileting OT Short Term Goal 3 - Progress (Week 1): Met Week 2:  OT Short Term Goal 1 (Week 2): Pt will complete toilet transfers supervision OT Short Term Goal 2 (Week 2): Pt will complete toileting tasks with CGA for standing balance while completing clothing management OT Short Term Goal 3 (Week 2): Pt will don pants with CGA via sit > stand vs lateral leans for increased independence  Skilled Therapeutic Interventions/Progress Updates:    Treatment session with focus on functional transfers, dynamic standing balance, and BUE strengthening.  Pt received seated EOB agreeable to therapy session.  Pt declined LB bathing and dressing as he does not have any clean clothing - reports his daughter is to come this weekend and will bring clothing.  Pt complete lateral scoot transfer bed > w/c with CGA and therapist stabilizing w/c.  Once in w/c, pt reports need to toilet therefore transferred lateral scoot w/c to bariatric drop arm BSC with min assist and tactile cues for anterior weight shift during transfer.  Pt required increased time to attempt BM, with no success. Pt completed hygiene with lateral leans with CGA and increased time for thoroughness.  Pt required mod assist sit > stand with heavy reliance on RW to come to standing and then therapist pulled pants over hips while pt maintained standing.  Completed lateral scoot transfer back to w/c min assist.  Completed oral care in sitting at sink.  Pt propelled w/c 100' for BUE strengthening and endurance, w/c pulls to Lt therefore therapist  assisted with remainder of distance to therapy gym.  Engaged in [place] with 3 sets of 10 chest presses, overhead presses, and bicep curls with 5" dowel.  Completed 2 sets of 5 w/c pushups for strengthening and increased clearance as needed for transfers.  Engaged in ball toss with 1kg ball  for endurance and strengthening.  Pt returned to room and remained upright in w/c with seat belt alarm on and all needs in reach.  Therapy Documentation Precautions:  Precautions Precautions: Fall Precaution Comments: recent L BKA Restrictions Weight Bearing Restrictions: Yes LLE Weight Bearing: Non weight bearing General:   Vital Signs: Therapy Vitals Temp: 98.5 F (36.9 C) Pulse Rate: 82 Resp: 18 BP: 100/62 Patient Position (if appropriate): Lying Oxygen Therapy SpO2: 91 % O2 Device: Room Air Pain: Pain Assessment Pain Score: 5  Premedicated  Therapy/Group: Individual Therapy  [name] 05/13/2020, 8:11 AM

## 2020-05-14 ENCOUNTER — Inpatient Hospital Stay (HOSPITAL_COMMUNITY): Payer: No Typology Code available for payment source

## 2020-05-14 ENCOUNTER — Inpatient Hospital Stay (HOSPITAL_COMMUNITY): Payer: No Typology Code available for payment source | Admitting: Physical Therapy

## 2020-05-14 DIAGNOSIS — I739 Peripheral vascular disease, unspecified: Secondary | ICD-10-CM

## 2020-05-14 LAB — CBC
HCT: 25.9 % — ABNORMAL LOW (ref 39.0–52.0)
Hemoglobin: 7.5 g/dL — ABNORMAL LOW (ref 13.0–17.0)
MCH: 26 pg (ref 26.0–34.0)
MCHC: 29 g/dL — ABNORMAL LOW (ref 30.0–36.0)
MCV: 89.6 fL (ref 80.0–100.0)
Platelets: 341 10*3/uL (ref 150–400)
RBC: 2.89 MIL/uL — ABNORMAL LOW (ref 4.22–5.81)
RDW: 18.1 % — ABNORMAL HIGH (ref 11.5–15.5)
WBC: 12.2 10*3/uL — ABNORMAL HIGH (ref 4.0–10.5)
nRBC: 0.6 % — ABNORMAL HIGH (ref 0.0–0.2)

## 2020-05-14 LAB — RENAL FUNCTION PANEL
Albumin: 2.4 g/dL — ABNORMAL LOW (ref 3.5–5.0)
Anion gap: 16 — ABNORMAL HIGH (ref 5–15)
BUN: 34 mg/dL — ABNORMAL HIGH (ref 8–23)
CO2: 24 mmol/L (ref 22–32)
Calcium: 8.2 mg/dL — ABNORMAL LOW (ref 8.9–10.3)
Chloride: 89 mmol/L — ABNORMAL LOW (ref 98–111)
Creatinine, Ser: 9.7 mg/dL — ABNORMAL HIGH (ref 0.61–1.24)
GFR calc Af Amer: 6 mL/min — ABNORMAL LOW (ref >60)
GFR calc non Af Amer: 5 mL/min — ABNORMAL LOW (ref >60)
Glucose, Bld: 139 mg/dL — ABNORMAL HIGH (ref 70–99)
Phosphorus: 4.4 mg/dL (ref 2.5–4.6)
Potassium: 4.4 mmol/L (ref 3.5–5.1)
Sodium: 129 mmol/L — ABNORMAL LOW (ref 135–145)

## 2020-05-14 LAB — GLUCOSE, CAPILLARY
Glucose-Capillary: 86 mg/dL (ref 70–99)
Glucose-Capillary: 139 mg/dL — ABNORMAL HIGH (ref 70–99)
Glucose-Capillary: 130 mg/dL — ABNORMAL HIGH (ref 70–99)
Glucose-Capillary: 127 mg/dL — ABNORMAL HIGH (ref 70–99)

## 2020-05-14 MED ORDER — LIDOCAINE-PRILOCAINE 2.5-2.5 % EX CREA: 1.0000 "application " | TOPICAL_CREAM | CUTANEOUS | Status: DC | PRN

## 2020-05-14 MED ORDER — HEPARIN SODIUM (PORCINE) 1000 UNIT/ML IJ SOLN
INTRAMUSCULAR | Status: AC
  Filled 2020-05-14: qty 1

## 2020-05-14 MED ORDER — LIDOCAINE HCL (PF) 1 % IJ SOLN: 5.0000 mL | INTRAMUSCULAR | Status: DC | PRN

## 2020-05-14 MED ORDER — DOXERCALCIFEROL 4 MCG/2ML IV SOLN
INTRAVENOUS | Status: AC
  Administered 2020-05-14: 6 ug via INTRAVENOUS
  Filled 2020-05-14: qty 4

## 2020-05-14 MED ORDER — HEPARIN SODIUM (PORCINE) 1000 UNIT/ML DIALYSIS: 1000.0000 [IU] | INTRAMUSCULAR | Status: DC | PRN

## 2020-05-14 MED ORDER — HEPARIN SODIUM (PORCINE) 1000 UNIT/ML IJ SOLN
INTRAMUSCULAR | Status: AC
  Administered 2020-05-14: 4200 [IU] via INTRAVENOUS_CENTRAL
  Filled 2020-05-14: qty 4

## 2020-05-14 MED ORDER — SODIUM CHLORIDE 0.9 % IV SOLN: 100.0000 mL | INTRAVENOUS | Status: DC | PRN

## 2020-05-14 MED ORDER — OXYCODONE HCL 5 MG PO TABS
ORAL_TABLET | ORAL | Status: AC
  Filled 2020-05-14: qty 2

## 2020-05-14 MED ORDER — PROSOURCE PLUS PO LIQD
30.0000 mL | Freq: Two times a day (BID) | ORAL | Status: DC
  Administered 2020-05-14 – 2020-05-26 (×20): 30 mL via ORAL
  Filled 2020-05-14 (×19): qty 30

## 2020-05-14 MED ORDER — ALTEPLASE 2 MG IJ SOLR: 2.0000 mg | Freq: Once | INTRAMUSCULAR | Status: DC | PRN

## 2020-05-14 MED ORDER — PENTAFLUOROPROP-TETRAFLUOROETH EX AERO: 1.0000 "application " | INHALATION_SPRAY | CUTANEOUS | Status: DC | PRN

## 2020-05-14 NOTE — Progress Notes (Signed)
Physical Therapy Session Note  Patient Details  Name: Jonathon Bailey MRN: [id number] Date of Birth: Aug 23, 1956  Today's Date: 05/14/2020 PT Individual Time: 8676-7209 PT Individual Time Calculation (min): 56 min   and  Today's Date: 05/14/2020 PT Missed Time: 30 Minutes Missed Time Reason: Unavailable (Comment) (off foor for dialysis)  Short Term Goals: Week 2:  PT Short Term Goal 1 (Week 2): Pt will transfer bed<>chair with LRAD CGA PT Short Term Goal 2 (Week 2): Pt will transfer sit<>stand with LRAD CGA PT Short Term Goal 3 (Week 2): Pt will initiate gait training  Skilled Therapeutic Interventions/Progress Updates:    Session 1: Pt received supine in bed and agreeable to therapy session. Supine>sitting R EOB, HOB partially elevated and using bedrails, with supervision and increased time. Sitting EOB donned shirt with set-up assist. Sitting EOB performed L LE knee extension x10reps with education on importance of maintaining full L knee extension ROM. L lateral scoot transfer transfer EOB>w/c with CGA for steadying and +2 for steadying w/c. Therapist educated pt on w/c part management including leg rests with hand-over-hand assistance. B UE w/c propulsion ~133ft with supervision, very slow propulsion - transported remainder of distance to therapy gym for energy conservation. Therapist provided w/c gloves for skin integrity.  In // bars worked on the following: - sit<>stands using B UE support on // bars with min/light mod assist for coming to stand - pt has delayed R hip/knee extension relying heavily on B UE support - started with hopping in place 2sets to fatigue ~8reps then ~12 reps with pt demoing some foot clearance that increased with increased practice  - gait training ~81ft in // bars with min assist of 1 for lifting and +2 w/c follow:        1st trial: unable to clear foot fully causing shuffle forward on ball of foot         2nd trial: put R shoe on and raised bars for more  elbow bend and pt demos improved foot clearance         3rd trial: continues to demo improving foot clearance and increasing step length        4th trial: fatiguing with decreased foot clearance - attempted to provide less assist but also resulted in less foot clearance Transported back to room in w/c. Pt requesting to return to sitting EOB. L lateral scoot w/c>EOB with CGA for safety and stabilizing w/c. Pt left seated EOB with needs in reach and bed alarm on.         Session 2: Pt off floor for dialysis - missed 30 minutes of skilled physical therapy.  Therapy Documentation Precautions:  Precautions Precautions: Fall Precaution Comments: recent L BKA Restrictions Weight Bearing Restrictions: Yes LLE Weight Bearing: Non weight bearing  Pain:   Session 1: Noticed some discomfort in L LE but no specific complaints of pain during session - allowed seated rest breaks as needed.   Therapy/Group: Individual Therapy  Tawana Scale , PT, DPT, CSRS  05/14/2020, 7:52 AM

## 2020-05-14 NOTE — Progress Notes (Addendum)
°  [facility] Progress Note   Subjective:  Seen during PT - doing well. No CP/dyspnea. Will be dialyzed later today.   Objective Vitals:   05/13/20 0907 05/13/20 1410 05/13/20 1939 05/14/20 0449  BP:  97/66 (!) 112/59 128/90  Pulse:  89 84 83  Resp:  18 18 18   Temp:  100 F (37.8 C) 99.1 F (37.3 C) 98.5 F (36.9 C)  TempSrc:  Oral Oral Oral  SpO2: 97% 95% 93% 98%  Weight:      Height:       Physical Exam General: Well appearing man, NAD Heart: RRR; no murmur Lungs: CTAB Abdomen: soft, non-tender Extremities: 2+ RLE edema, L BKA bandaged Dialysis Access: TDC in L chest, C/D/I  Additional Objective Labs: Basic Metabolic Panel: Recent Labs  Lab 05/08/20 1149 05/10/20 1521 05/12/20 1444  NA 131* 134* 130*  K 4.9 4.3 4.0  CL 92* 93* 92*  CO2 23 25 26   GLUCOSE 123* 130* 122*  BUN 53* 46* 38*  CREATININE 12.34* 10.71* 9.77*  CALCIUM 8.1* 8.0* 8.2*  PHOS 6.3* 3.8 4.2   Liver Function Tests: Recent Labs  Lab 05/08/20 1149 05/10/20 1521 05/12/20 1444  ALBUMIN 2.1* 2.2* 2.2*   CBC: Recent Labs  Lab 05/08/20 1148 05/10/20 1521 05/12/20 1444  WBC 13.7* 12.9* 12.1*  HGB 7.4* 7.1* 7.2*  HCT 25.3* 25.0* 25.5*  MCV 90.0 91.6 91.4  PLT 478* 447* 404*   Medications:   vitamin C  1,000 mg Oral BID   aspirin  81 mg Oral QODAY   atorvastatin  40 mg Oral QHS   carvedilol  3.125 mg Oral BID WC   cetaphil  1 application Topical Daily   Chlorhexidine Gluconate Cloth  6 each Topical Q0600   cinacalcet  90 mg Oral Q supper   darbepoetin (ARANESP) injection - DIALYSIS  100 mcg Intravenous Q Thu-HD   doxercalciferol  6 mcg Intravenous Q T,Th,Sa-HD   ferrous gluconate  324 mg Oral BID WC   heparin  5,000 Units Subcutaneous Q8H   insulin aspart  0-15 Units Subcutaneous TID WC   insulin glargine  20 Units Subcutaneous QHS   melatonin  5 mg Oral QHS   menthol-cetylpyridinium  1 lozenge Oral Daily   multivitamin  1 tablet Oral QHS    pantoprazole  40 mg Oral Daily   polyethylene glycol  17 g Oral BID   senna-docusate  2 tablet Oral BID   sevelamer carbonate  4.8 g Oral TID WC   umeclidinium bromide  1 puff Inhalation Daily    Dialysis Orders: TTS @ [place] 4.5h 400/800 126kg 2/2.25 bath Hep 12000bolusTDC -Mircera88mcg q 2wks - just reordered/not yet given,lastdose1/26/21 -Hectorol54mcg IV qHD  Assessment/Plan: 1. PAD with L leg ischemia + gangrene:X-ray w/o signs of osteomyelitis, s/p courseVanc + Zosyn.VVS consulted-> underwent BLE arteriogram 8/6showingsevere bilateralPAD below the knees. 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 Donnetta Hutching. In CIR. 2. ESRD:Continue HD per TTS schedule -HD today. 3. Hypotension/volume: Chronic low BP - Coregreduced. EDW will be lower on discharge.Now with some RLE edema, will ^ UF goal for today. 4. AnemiaofESRD:Hgb 7.2.ContinueAranesp 155mcgq Thurs.No IV iron due to allergy- started on PO iron BID instead (tsat 6%, ferritin 218). 5. Secondary Hyperparathyroidism:CorrCa/Phos good.Continue VDRA, sensipar,and binders. 6. Nutrition- Renal diet w/fluid restrictions, adding supplements. 7. DMT2- Per primary 8. CAD s/p CABG -Per primary 9. Hx CVA 10. COPD  Pollyann Kennedy 05/14/2020, 9:24 AM  [employer]

## 2020-05-14 NOTE — Progress Notes (Signed)
Occupational Therapy Session Note  Patient Details  Name: Jonathon Bailey MRN: [id number] Date of Birth: 06-28-1956  Today's Date: 05/14/2020 OT Individual Time: 2831-5176 OT Individual Time Calculation (min): 58 min    Short Term Goals: Week 1:  OT Short Term Goal 1 (Week 1): Pt will trf with LRAD  to w/c in prep for toileting with MOD A OT Short Term Goal 1 - Progress (Week 1): Met OT Short Term Goal 2 (Week 1): Pt will thread BLE into pants with no more than CGA for sitting balance OT Short Term Goal 2 - Progress (Week 1): Progressing toward goal OT Short Term Goal 3 (Week 1): Pt will lean laterally EOB/EOM with S in prep for toileting OT Short Term Goal 3 - Progress (Week 1): Met  Skilled Therapeutic Interventions/Progress Updates:    OT session focused on ADL retraining, functional transfers, activity tolerance, and safety awareness. Pt received sitting EOB requesting to sponge bathe. Completed with min A for thoroughness and use of lateral lean for buttocks hygiene. Completed dressing sit<>stand with mod A to stand from elevated bed and heavy reliance on RW. OT provided assist for pulling pants up. Completed squat pivot transfer bed <>w/c with mod A. Pt completed oral care then returned to bed and left with all things in reach as he awaited for dialysis.   Therapy Documentation Precautions:  Precautions Precautions: Fall Precaution Comments: recent L BKA Restrictions Weight Bearing Restrictions: Yes LLE Weight Bearing: Non weight bearing General:   Vital Signs:   Pain:   ADL:   Vision   Perception    Praxis   Exercises:   Other Treatments:     Therapy/Group: Individual Therapy  Duayne Cal 05/14/2020, 12:06 PM

## 2020-05-14 NOTE — Progress Notes (Signed)
[place] PHYSICAL MEDICINE & REHABILITATION PROGRESS NOTE   Subjective/Complaints: Pt in bed. No new complaints. Says pain is controlled.   ROS: Limited due to  /behavioral    Objective:   No results found. Recent Labs    05/12/20 1444  WBC 12.1*  HGB 7.2*  HCT 25.5*  PLT 404*   Recent Labs    05/12/20 1444  NA 130*  K 4.0  CL 92*  CO2 26  GLUCOSE 122*  BUN 38*  CREATININE 9.77*  CALCIUM 8.2*    Intake/Output Summary (Last 24 hours) at 05/14/2020 1029 Last data filed at 05/13/2020 1700 Gross per 24 hour  Intake 360 ml  Output --  Net 360 ml     Physical Exam: Vital Signs Blood pressure 128/90, pulse 83, temperature 98.5 F (36.9 C), temperature source Oral, resp. rate 18, height 6' (1.829 m), weight 122 kg, SpO2 98 %. Constitutional: No distress . Vital signs reviewed. obese HEENT: EOMI, oral membranes moist Neck: supple Cardiovascular: RRR without murmur. No JVD    Respiratory/Chest: CTA Bilaterally without wheezes or rales. Normal effort    GI/Abdomen: BS +, non-tender, non-distended Ext: no clubbing, cyanosis, or edema Psych: pleasant and cooperative Skin: Left BKA with dressing CDI Psych: Normal mood.  Normal behavior. Musc: Left BKA with less edema, less tender Neuro: Alert Motor: Bilateral upper extremities: 5/5 proximal distal Left lower extremity: 4+/5 proximal distally Right lower extremity: Hip flexion 4/5 (pain inhibition)--improving  Assessment/Plan: 1. Functional deficits secondary to left BKA which require 3+ hours per day of interdisciplinary therapy in a comprehensive inpatient rehab setting.  Physiatrist is providing close team supervision and 24 hour management of active medical problems listed below.  Physiatrist and rehab team continue to assess barriers to discharge/monitor patient progress toward functional and medical goals  Care Tool:  Bathing  Bathing activity did not occur: Refused Body parts bathed by patient: Right  arm, Left arm, Chest, Abdomen, Front perineal area, Buttocks, Right upper leg, Left upper leg, Face   Body parts bathed by helper: Right lower leg Body parts n/a: Left lower leg   Bathing assist Assist Level: Minimal Assistance - Patient > 75%     Upper Body Dressing/Undressing Upper body dressing   What is the patient wearing?: Pull over shirt    Upper body assist Assist Level: Set up assist    Lower Body Dressing/Undressing Lower body dressing      What is the patient wearing?: Pants     Lower body assist Assist for lower body dressing: Maximal Assistance - Patient 25 - 49%     Toileting Toileting Toileting Activity did not occur (Clothing management and hygiene only): N/A (no void or bm)  Toileting assist Assist for toileting: Maximal Assistance - Patient 25 - 49%     Transfers Chair/bed transfer  Transfers assist     Chair/bed transfer assist level: Contact Guard/Touching assist     Locomotion Ambulation   Ambulation assist   Ambulation activity did not occur: Safety/medical concerns          Walk 10 feet activity   Assist  Walk 10 feet activity did not occur: Safety/medical concerns        Walk 50 feet activity   Assist Walk 50 feet with 2 turns activity did not occur: Safety/medical concerns         Walk 150 feet activity   Assist Walk 150 feet activity did not occur: Safety/medical concerns  Walk 10 feet on uneven surface  activity   Assist Walk 10 feet on uneven surfaces activity did not occur: Safety/medical concerns         Wheelchair     Assist Will patient use wheelchair at discharge?: Yes Type of Wheelchair: Manual    Wheelchair assist level: Supervision/Verbal cueing Max wheelchair distance: 152ft    Wheelchair 50 feet with 2 turns activity    Assist        Assist Level: Supervision/Verbal cueing   Wheelchair 150 feet activity     Assist      Assist Level: Moderate Assistance  - Patient 50 - 74%   Blood pressure 128/90, pulse 83, temperature 98.5 F (36.9 C), temperature source Oral, resp. rate 18, height 6' (1.829 m), weight 122 kg, SpO2 98 %.    Medical Problem List and Plan: 1.  Limitations with mobility, transfers, endurance, self-care secondary to left BKA.  Continue CIR  Plan to order stump shrinker next week 2.  Antithrombotics: -DVT/anticoagulation:  Pharmaceutical: Heparin             -antiplatelet therapy: ASA daily  3. Pain Management: Oxycodone as needed   Controlled with meds on 8/21             Monitor with increased exertion. 4. Mood: Team support             -antipsychotic agents: N/a 5. Neuropsych: This patient is capable of making decisions on his own behalf.  Discussed with Neuropsych, appreciate recs 6. Skin/Wound Care: Daily dressing changes. Monitor wound for healing.  Vitamin C for healing.  Prosource added to promote wound healing.  7. Fluids/Electrolytes/Nutrition: Monitor I/O.  Modified/renal diet with 1200 ccFR/day. Labs monitored with HD.  Patient still upset about diet as heart healthy, CM, renal diet recommended.  8.  T2DM with hyperglycemia:  Hgb A1c- 7.9.  Continue Lantus 20 units daily (On levemir 75 mg bid/Novolog 5 untis bid PTA- reports eats better at home). Continue to monitor BS ac/hs and use SSI for elevated blood sugars.  8/21 improved control 9.  ESRD: Schedule hemodialysis at the end of the day on TTS to help with tolerance of activity.  Metabolic bone disease managed with Hectorol and Renvela. 10.  COPD: Respiratory status stable on Incruse daily. With congestion- CXR shows atelectasis. Encourage use of IS.  11.  Anemia of chronic disease: Serial CBCs with hemodialysis.  On IV Aranesp weekly with Wynonia Lawman twice daily.  Hemoglobin 7.2 on 8/19  Continue to monitor 12.  History of CAD: Carvedilol decreased due to hypotension --continue Coreg 3.125 mg bid with Lipitor and ASA. 13.  Hypertension  See #12  Controlled on  8/21             Monitor with increased mobility. 14.  Leukocytosis  WBC 12.1 on 8/19  Afebrile  Continue to monitor 15.  Drug-induced constipation  Abdominal x-ray unremarkable  Bowel meds increased on 8/18--had bm 8/19  Patient noncompliant with bowel meds  LOS: 8 days A FACE TO FACE EVALUATION WAS PERFORMED  Meredith Staggers 05/14/2020, 10:29 AM

## 2020-05-15 ENCOUNTER — Encounter (HOSPITAL_COMMUNITY): Payer: No Typology Code available for payment source | Admitting: Occupational Therapy

## 2020-05-15 LAB — GLUCOSE, CAPILLARY
Glucose-Capillary: 86 mg/dL (ref 70–99)
Glucose-Capillary: 128 mg/dL — ABNORMAL HIGH (ref 70–99)
Glucose-Capillary: 122 mg/dL — ABNORMAL HIGH (ref 70–99)
Glucose-Capillary: 125 mg/dL — ABNORMAL HIGH (ref 70–99)

## 2020-05-15 NOTE — Progress Notes (Signed)
[facility] Progress Note   Subjective:  Seen in room. No overnight CP or dyspnea. Dialyzed yesterday with 2.6L net UF.  Objective Vitals:   05/14/20 2020 05/14/20 2320 05/15/20 0500 05/15/20 0557  BP: 106/79 (!) 97/58  101/64  Pulse: 94 67  83  Resp: 18   18  Temp: 98.8 F (37.1 C)   98.6 F (37 C)  TempSrc:    Oral  SpO2: 97%   97%  Weight:   120.1 kg   Height:       Physical Exam General: Well appearing man, NAD Heart: RRR; no murmur Lungs: CTAB Abdomen: soft, non-tender Extremities: 1+ RLE edema, L BKA bandaged Dialysis Access: TDC in L chest, C/D/I  Additional Objective Labs: Basic Metabolic Panel: Recent Labs  Lab 05/10/20 1521 05/12/20 1444 05/14/20 1210  NA 134* 130* 129*  K 4.3 4.0 4.4  CL 93* 92* 89*  CO2 25 26 24   GLUCOSE 130* 122* 139*  BUN 46* 38* 34*  CREATININE 10.71* 9.77* 9.70*  CALCIUM 8.0* 8.2* 8.2*  PHOS 3.8 4.2 4.4   Liver Function Tests: Recent Labs  Lab 05/10/20 1521 05/12/20 1444 05/14/20 1210  ALBUMIN 2.2* 2.2* 2.4*   CBC: Recent Labs  Lab 05/08/20 1148 05/08/20 1148 05/10/20 1521 05/12/20 1444 05/14/20 1210  WBC 13.7*   < > 12.9* 12.1* 12.2*  HGB 7.4*   < > 7.1* 7.2* 7.5*  HCT 25.3*   < > 25.0* 25.5* 25.9*  MCV 90.0  --  91.6 91.4 89.6  PLT 478*   < > 447* 404* 341   < > = values in this interval not displayed.   CBG: Recent Labs  Lab 05/14/20 0649 05/14/20 1157 05/14/20 1820 05/14/20 2100 05/15/20 0608  GLUCAP 86 139* 130* 127* 86   Medications:  . (feeding supplement) PROSource Plus  30 mL Oral BID BM  . vitamin C  1,000 mg Oral BID  . aspirin  81 mg Oral QODAY  . atorvastatin  40 mg Oral QHS  . carvedilol  3.125 mg Oral BID WC  . cetaphil  1 application Topical Daily  . Chlorhexidine Gluconate Cloth  6 each Topical Q0600  . cinacalcet  90 mg Oral Q supper  . darbepoetin (ARANESP) injection - DIALYSIS  100 mcg Intravenous Q Thu-HD  . doxercalciferol  6 mcg Intravenous Q T,Th,Sa-HD  .  ferrous gluconate  324 mg Oral BID WC  . heparin  5,000 Units Subcutaneous Q8H  . insulin aspart  0-15 Units Subcutaneous TID WC  . insulin glargine  20 Units Subcutaneous QHS  . melatonin  5 mg Oral QHS  . menthol-cetylpyridinium  1 lozenge Oral Daily  . multivitamin  1 tablet Oral QHS  . pantoprazole  40 mg Oral Daily  . polyethylene glycol  17 g Oral BID  . senna-docusate  2 tablet Oral BID  . sevelamer carbonate  4.8 g Oral TID WC  . umeclidinium bromide  1 puff Inhalation Daily    Dialysis Orders: TTS @ [facility] 4.5h 400/800 126kg 2/2.25 bath Hep 12000bolusTDC -Mircera24mcg q 2wks - just reordered/not yet given,lastdose1/26/21 -Hectorol46mcg IV qHD  Assessment/Plan: 1. PAD with L leg ischemia + gangrene:X-ray w/o signs of osteomyelitis, s/p courseVanc + Zosyn.VVS consulted-> underwent BLE arteriogram 8/6showingsevere bilateralPAD below the knees -> followed by L BKA 8/9 by Dr. Donnetta Hutching. In CIR. 2. ESRD:Continue HD per TTS schedule -next HD 8/24. 3. Hypotension/volume: Chronic low BP - Coregreduced. EDW will be lower on discharge.Some RLE edema, UF  as tolerated. 4. AnemiaofESRD:Hgb 7.5.ContinueAranesp 182mcgq Thurs.No IV iron due to allergy-  on PO iron BID instead (tsat 6%, ferritin 218). 5. Secondary Hyperparathyroidism:CorrCa/Phos good.Continue VDRA, sensipar,and binders. 6. Nutrition- Renal diet w/fluid restrictions + supplements. 7. DMT2- Per primary 8. CAD s/p CABG -Per primary 9. Hx CVA 10. COPD  Jonathon Bailey 05/15/2020, 8:57 AM  [employer]

## 2020-05-15 NOTE — Progress Notes (Signed)
Occupational Therapy Session Note  Patient Details  Name: MASSIE MEES MRN: [id number] Date of Birth: May 12, 1956  Today's Date: 05/15/2020 OT Group Time: 1100-1200 OT Group Time Calculation (min): 60 min   Skilled Therapeutic Interventions/Progress Updates:    Pt engaged in therapeutic w/c level dance group focusing on patient choice, UE/LE strengthening, salience, activity tolerance, and social participation. Pt was guided through various dance-based exercises involving UEs/LEs and trunk. All music was selected by group members. Emphasis placed on general strengthening and activity tolerance. Pt participated throughout group per his tolerance, requested a few songs to listen to with encouragement. He declined standing when given this opportunity. At end of session pt was returned to room by RT.    Therapy Documentation Precautions:  Precautions Precautions: Fall Precaution Comments: recent L BKA Restrictions Weight Bearing Restrictions: Yes LLE Weight Bearing: Non weight bearing Pain: no s/s pain during tx Pain Assessment Pain Scale: 0-10 Pain Score: 0-No pain Pain Type: Acute pain Pain Location: Leg Pain Orientation: Left Pain Descriptors / Indicators: Aching Pain Frequency: Intermittent Pain Onset: On-going Pain Intervention(s): Medication (See eMAR) ADL:       Therapy/Group: Group Therapy  Naiah Donahoe A Lezlee Gills 05/15/2020, 12:49 PM

## 2020-05-15 NOTE — Plan of Care (Signed)
  Problem: RH BOWEL ELIMINATION Goal: RH STG MANAGE BOWEL WITH ASSISTANCE Description: STG Manage Bowel with mod I Assistance. Outcome: Not Progressing; LBM 8/19; pt has been refusing his laxatives ; educated took his miralax today

## 2020-05-16 ENCOUNTER — Inpatient Hospital Stay (HOSPITAL_COMMUNITY): Payer: No Typology Code available for payment source | Admitting: Occupational Therapy

## 2020-05-16 ENCOUNTER — Inpatient Hospital Stay (HOSPITAL_COMMUNITY): Payer: No Typology Code available for payment source

## 2020-05-16 LAB — GLUCOSE, CAPILLARY
Glucose-Capillary: 122 mg/dL — ABNORMAL HIGH (ref 70–99)
Glucose-Capillary: 109 mg/dL — ABNORMAL HIGH (ref 70–99)
Glucose-Capillary: 116 mg/dL — ABNORMAL HIGH (ref 70–99)
Glucose-Capillary: 144 mg/dL — ABNORMAL HIGH (ref 70–99)

## 2020-05-16 NOTE — Progress Notes (Signed)
Occupational Therapy Session Note  Patient Details  Name: Jonathon Bailey MRN: [id number] Date of Birth: 09/01/1956  Today's Date: 05/16/2020 OT Individual Time: 1330-1350 OT Individual Time Calculation (min): 20 min   Patient missed 40 minutes due to nausea, low BP and significant fatigue limiting tolerance   Short Term Goals: Week 2:  OT Short Term Goal 1 (Week 2): Pt will complete toilet transfers supervision OT Short Term Goal 2 (Week 2): Pt will complete toileting tasks with CGA for standing balance while completing clothing management OT Short Term Goal 3 (Week 2): Pt will don pants with CGA via sit > stand vs lateral leans for increased independence  Skilled Therapeutic Interventions/Progress Updates:    patient seated edge of bed with NT upon arrival.  Patient notes ongoing nausea, NT recorded vitals to include BP 85/50.  Assisted patient to side lying and supine position but he c/o increased nausea in this position.   Returned to sitting edge of bed with min A.  He requested to sit in recliner.  Completed sit pivot transfer bed to recliner with min A.  He declined further activity at this time due to fatigue.  Seat belt alarm set, call bell and tray table in reach.  Nursing aware of vitals and patient positioning at this time.  Will attempt to make up missed time as able.    Therapy Documentation Precautions:  Precautions Precautions: Fall Precaution Comments: recent L BKA Restrictions Weight Bearing Restrictions: Yes LLE Weight Bearing: Non weight bearing  Therapy/Group: Individual Therapy  Carlos Levering 05/16/2020, 7:55 AM

## 2020-05-16 NOTE — Progress Notes (Signed)
This nurse checked on pt following emesis episode in PT; pt reports it came after feeling like he was straining the muscles he was working out in therapy; pt reports no pain, nausea, or discomfort at this time yet refused to eat most of lunch and take renvela. Pt voiced "im alright. Im taking care of it", refusing any medication interventions. Will continue to monitor.

## 2020-05-16 NOTE — Progress Notes (Signed)
Physical Therapy Session Note  Patient Details  Name: Jonathon Bailey MRN: [id number] Date of Birth: 03/14/1956  Today's Date: 05/16/2020 PT Individual Time: 5361-4431 PT Individual Time Calculation (min): 73 min   Short Term Goals: Week 1:  PT Short Term Goal 1 (Week 1): pt to demonstrate supine<>sit at min A x1 PT Short Term Goal 1 - Progress (Week 1): Met PT Short Term Goal 2 (Week 1): pt to demonstrate transfers with LRAD at min A x1 PT Short Term Goal 2 - Progress (Week 1): Met PT Short Term Goal 4 (Week 1): pt to initiate standing with RW PT Short Term Goal 4 - Progress (Week 1): Met PT Short Term Goal 5 (Week 1): pt to mobilize WC 150' min A PT Short Term Goal 5 - Progress (Week 1): Progressing toward goal Week 2:  PT Short Term Goal 1 (Week 2): Pt will transfer bed<>chair with LRAD CGA PT Short Term Goal 2 (Week 2): Pt will transfer sit<>stand with LRAD CGA PT Short Term Goal 3 (Week 2): Pt will initiate gait training  Skilled Therapeutic Interventions/Progress Updates:    Patient receiving sitting up on EOB, agreeable to PT. He denies pain, but endorses fatigue. Patient able to complete lateral scoot transfer bed > wc with SBA and guarding of the wc. He was able to propel wc with B UE x65ft before fatiguing. Lateral scoot with SBA completed wc > therapy mat. Patient laying supine on wedge to complete therex. Passive supine stretch to L HF x2 mins, SLR 2x10, sidelying L hip ext 2x10, sidelying L hip abd 2x10 with verbal cues for proper form. Patient engaging L HF in sidelying to compensate for weak L hip abductors requiring tactile cuing to maintain proper form for appropriate mm engagement. Upon sitting up, patient reporting increased nausea from reflux. Patient with episode of emesis x2 sitting edge of mat. Patient stating he's "okay to continue therapy." Patient able to remain standing for up to 45s complete U UE dynamic task crossing midline. He required CGA to maintain safe  standing balance. Patient completed lateral scoot to wc with SBA. He was able to demonstrate improved efficiency of wc mechanics when he engaged R LE to assist. He was able to propel 13ft with 1 rest break. Patient returned to edge of bed via lateral scoot, SBA. Bed alarm on, call light within reach. NT present and made aware of patients episode of emesis stating that she will alert RN.   Therapy Documentation Precautions:  Precautions Precautions: Fall Precaution Comments: recent L BKA Restrictions Weight Bearing Restrictions: Yes LLE Weight Bearing: Non weight bearing    Therapy/Group: Individual Therapy  Karoline Caldwell, PT, DPT, CBIS 05/16/2020, 10:31 AM

## 2020-05-16 NOTE — Progress Notes (Signed)
[place] PHYSICAL MEDICINE & REHABILITATION PROGRESS NOTE   Subjective/Complaints:  No issues overnite Per PT had reflux with small volue emesis after laying dow flat in PT gym.  Pt denies abd pain no cough or wheezing   ROS: denies CP, SOB, N/V/D   Objective:   No results found. Recent Labs    05/14/20 1210  WBC 12.2*  HGB 7.5*  HCT 25.9*  PLT 341   Recent Labs    05/14/20 1210  NA 129*  K 4.4  CL 89*  CO2 24  GLUCOSE 139*  BUN 34*  CREATININE 9.70*  CALCIUM 8.2*    Intake/Output Summary (Last 24 hours) at 05/16/2020 1123 Last data filed at 05/16/2020 0900 Gross per 24 hour  Intake 900 ml  Output 1 ml  Net 899 ml     Physical Exam: Vital Signs Blood pressure 98/62, pulse 70, temperature 97.7 F (36.5 C), resp. rate 20, height 6' (1.829 m), weight 120.1 kg, SpO2 98 %.  General: No acute distress Mood and affect are appropriate Heart: Regular rate and rhythm no rubs murmurs or extra sounds Lungs: Clear to auscultation, breathing unlabored, no rales or wheezes Abdomen: Positive bowel sounds, soft nontender to palpation, nondistended Extremities: No clubbing, cyanosis, or edema Skin: No evidence of breakdown, no evidence of rash   Skin: Left BKA with dressing CDI Psych: Normal mood.  Normal behavior. Musc: Left BKA with less edema, less tender Neuro: Alert Motor: Bilateral upper extremities: 5/5 proximal distal Left lower extremity: 4+/5 proximal distally Right lower extremity: Hip flexion 4/5 (pain inhibition)--improving  Assessment/Plan: 1. Functional deficits secondary to left BKA which require 3+ hours per day of interdisciplinary therapy in a comprehensive inpatient rehab setting.  Physiatrist is providing close team supervision and 24 hour management of active medical problems listed below.  Physiatrist and rehab team continue to assess barriers to discharge/monitor patient progress toward functional and medical goals  Care  Tool:  Bathing  Bathing activity did not occur: Refused Body parts bathed by patient: Left arm, Right arm, Chest, Abdomen, Front perineal area, Buttocks, Right upper leg, Left upper leg, Right lower leg, Face   Body parts bathed by helper: Right lower leg Body parts n/a: Left lower leg   Bathing assist Assist Level: Minimal Assistance - Patient > 75%     Upper Body Dressing/Undressing Upper body dressing   What is the patient wearing?: Pull over shirt    Upper body assist Assist Level: Set up assist    Lower Body Dressing/Undressing Lower body dressing      What is the patient wearing?: Underwear/pull up, Pants     Lower body assist Assist for lower body dressing: Moderate Assistance - Patient 50 - 74%     Toileting Toileting Toileting Activity did not occur Landscape architect and hygiene only): N/A (no void or bm)  Toileting assist Assist for toileting: Maximal Assistance - Patient 25 - 49%     Transfers Chair/bed transfer  Transfers assist     Chair/bed transfer assist level: Supervision/Verbal cueing     Locomotion Ambulation   Ambulation assist   Ambulation activity did not occur: Safety/medical concerns  Assist level: 2 helpers (min A of 1 and +2 w/c follow) Assistive device: Parallel bars Max distance: 62ft   Walk 10 feet activity   Assist  Walk 10 feet activity did not occur: Safety/medical concerns        Walk 50 feet activity   Assist Walk 50 feet with 2 turns activity  did not occur: Safety/medical concerns         Walk 150 feet activity   Assist Walk 150 feet activity did not occur: Safety/medical concerns         Walk 10 feet on uneven surface  activity   Assist Walk 10 feet on uneven surfaces activity did not occur: Safety/medical concerns         Wheelchair     Assist Will patient use wheelchair at discharge?: Yes Type of Wheelchair: Manual    Wheelchair assist level: Supervision/Verbal cueing, Set up  assist Max wheelchair distance: 120ft    Wheelchair 50 feet with 2 turns activity    Assist        Assist Level: Supervision/Verbal cueing, Set up assist   Wheelchair 150 feet activity     Assist      Assist Level: Moderate Assistance - Patient 50 - 74%   Blood pressure 98/62, pulse 70, temperature 97.7 F (36.5 C), resp. rate 20, height 6' (1.829 m), weight 120.1 kg, SpO2 98 %.    Medical Problem List and Plan: 1.  Limitations with mobility, transfers, endurance, self-care secondary to left BKA.  Continue CIR  Plan to order stump shrinker next week 2.  Antithrombotics: -DVT/anticoagulation:  Pharmaceutical: Heparin             -antiplatelet therapy: ASA daily  3. Pain Management: Oxycodone as needed   Controlled with meds on 8/23            Monitor with increased exertion. 4. Mood: Team support             -antipsychotic agents: N/a 5. Neuropsych: This patient is capable of making decisions on his own behalf.  Discussed with Neuropsych, appreciate recs 6. Skin/Wound Care: Daily dressing changes. Monitor wound for healing.  Vitamin C for healing.  Prosource added to promote wound healing.  7. Fluids/Electrolytes/Nutrition: Monitor I/O.  Modified/renal diet with 1200 ccFR/day. Labs monitored with HD.  Patient still upset about diet as heart healthy, CM, renal diet recommended.  8.  T2DM with hyperglycemia:  Hgb A1c- 7.9.  Continue Lantus 20 units daily (On levemir 75 mg bid/Novolog 5 untis bid PTA- reports eats better at home). Continue to monitor BS ac/hs and use SSI for elevated blood sugars. CBG (last 3)  Recent Labs    05/15/20 2129 05/16/20 0605 05/16/20 1158  GLUCAP 125* 122* 109*    9.  ESRD: Schedule hemodialysis at the end of the day on TTS to help with tolerance of activity.  Metabolic bone disease managed with Hectorol and Renvela. 10.  COPD: Respiratory status stable on Incruse daily. With congestion- CXR shows atelectasis. Encourage use of IS.   11.  Anemia of chronic disease: Serial CBCs with hemodialysis.  On IV Aranesp weekly with Wynonia Lawman twice daily.  Hemoglobin 7.2 on 8/19, 7.5 on 8/21  Continue to monitor 12.  History of CAD: Carvedilol decreased due to hypotension --continue Coreg 3.125 mg bid with Lipitor and ASA. 13.  Hypertension  See #12   Vitals:   05/15/20 2008 05/16/20 0526  BP: (!) 105/57 98/62  Pulse: 82 70  Resp: 16 20  Temp: 98.7 F (37.1 C) 97.7 F (36.5 C)  SpO2: 95% 98%  Controlled HR wnl, monitor for orthostasis on HD days  14.  Leukocytosis  WBC 12.1 on 8/19  Afebrile  Continue to monitor 15.  Drug-induced constipation  Abdominal x-ray unremarkable  Bowel meds increased on 8/18--had bm 8/19  Patient noncompliant with  bowel meds 16. GERD PPI 40mg  qd LOS: 10 days A FACE TO FACE EVALUATION WAS PERFORMED  Charlett Blake 05/16/2020, 11:23 AM

## 2020-05-16 NOTE — Progress Notes (Signed)
Patient ID: Jonathon Bailey, male   DOB: 1955-11-25, 64 y.o.   MRN: [id number]  Met with pt to discuss facilities and he did check with his buddies and they have two in mind-[facility] and [facility]. He is making progress and doing better but will not be at a level where he is safe to be home alone. Will do Fl2 and start bed search due to with his HD he may be more difficult to place due to limiting dialysis pt.

## 2020-05-16 NOTE — Progress Notes (Signed)
Patient ID: Jonathon Bailey, male   DOB: 06/02/1956, 64 y.o.   MRN: [id number] Feels weak in general today.  Husband standing and transferring with physical therapy.  Dressing changed.  Excellent healing of his BKA with no evidence of skin breakdown.  Very slight serous drainage.  Following from sidelines as he makes progress in CIR

## 2020-05-16 NOTE — Progress Notes (Signed)
Physical Therapy Session Note  Patient Details  Name: Jonathon Bailey MRN: [id number] Date of Birth: [birth year]  Today's Date: 05/16/2020 PT Individual Time: 0800-0856 PT Individual Time Calculation (min): 56 min   Short Term Goals: Week 1:  PT Short Term Goal 1 (Week 1): pt to demonstrate supine<>sit at min A x1 PT Short Term Goal 1 - Progress (Week 1): Met PT Short Term Goal 2 (Week 1): pt to demonstrate transfers with LRAD at min A x1 PT Short Term Goal 2 - Progress (Week 1): Met PT Short Term Goal 4 (Week 1): pt to initiate standing with RW PT Short Term Goal 4 - Progress (Week 1): Met PT Short Term Goal 5 (Week 1): pt to mobilize WC 150' min A PT Short Term Goal 5 - Progress (Week 1): Progressing toward goal  Skilled Therapeutic Interventions/Progress Updates:    Patient received sitting EOB, agreeable to PT. He denies pain at this time. Patient able to don pants in standing with ModA for clothing management and CGA for dynamic standing balance. Patient able to complete morning ADLs at sink with set up assist. He remains limited in endurance for efficient wc propulsion as he is able to propel wc roughly 34f, slowly, prior to requiring extended rest break. Seated UE ergometer completed 2' fwd x2 and 2' backward x2 for improved UE endurance and strength. Patient able to maintain back away from chair for ~1/2 the time indicating poor core endurance as well. Patient able to progress sitting dynamic balance to include weighted objects reaching outside his BOS while seated. Patient stating he's "a little too tired" to complete standing tasks yet this AM. Patient returned to sitting EOB, bed alarm on, call light within reach. He denies pain at end of session.   Therapy Documentation Precautions:  Precautions Precautions: Fall Precaution Comments: recent L BKA Restrictions Weight Bearing Restrictions: Yes LLE Weight Bearing: Non weight bearing    Therapy/Group: Individual  Therapy  JKaroline Caldwell PT, DPT, CBIS 05/16/2020, 7:37 AM

## 2020-05-17 ENCOUNTER — Inpatient Hospital Stay (HOSPITAL_COMMUNITY): Payer: No Typology Code available for payment source | Admitting: Occupational Therapy

## 2020-05-17 ENCOUNTER — Inpatient Hospital Stay (HOSPITAL_COMMUNITY): Payer: No Typology Code available for payment source

## 2020-05-17 LAB — RENAL FUNCTION PANEL
Albumin: 2.3 g/dL — ABNORMAL LOW (ref 3.5–5.0)
Anion gap: 14 (ref 5–15)
BUN: 44 mg/dL — ABNORMAL HIGH (ref 8–23)
CO2: 24 mmol/L (ref 22–32)
Calcium: 8.2 mg/dL — ABNORMAL LOW (ref 8.9–10.3)
Chloride: 91 mmol/L — ABNORMAL LOW (ref 98–111)
Creatinine, Ser: 11.05 mg/dL — ABNORMAL HIGH (ref 0.61–1.24)
GFR calc Af Amer: 5 mL/min — ABNORMAL LOW (ref >60)
GFR calc non Af Amer: 4 mL/min — ABNORMAL LOW (ref >60)
Glucose, Bld: 131 mg/dL — ABNORMAL HIGH (ref 70–99)
Phosphorus: 5.1 mg/dL — ABNORMAL HIGH (ref 2.5–4.6)
Potassium: 4 mmol/L (ref 3.5–5.1)
Sodium: 129 mmol/L — ABNORMAL LOW (ref 135–145)

## 2020-05-17 LAB — CBC
HCT: 22.2 % — ABNORMAL LOW (ref 39.0–52.0)
Hemoglobin: 6.2 g/dL — CL (ref 13.0–17.0)
MCH: 25.6 pg — ABNORMAL LOW (ref 26.0–34.0)
MCHC: 27.9 g/dL — ABNORMAL LOW (ref 30.0–36.0)
MCV: 91.7 fL (ref 80.0–100.0)
Platelets: 291 10*3/uL (ref 150–400)
RBC: 2.42 MIL/uL — ABNORMAL LOW (ref 4.22–5.81)
RDW: 18.4 % — ABNORMAL HIGH (ref 11.5–15.5)
WBC: 10.5 10*3/uL (ref 4.0–10.5)
nRBC: 0.4 % — ABNORMAL HIGH (ref 0.0–0.2)

## 2020-05-17 LAB — GLUCOSE, CAPILLARY
Glucose-Capillary: 89 mg/dL (ref 70–99)
Glucose-Capillary: 125 mg/dL — ABNORMAL HIGH (ref 70–99)
Glucose-Capillary: 86 mg/dL (ref 70–99)
Glucose-Capillary: 112 mg/dL — ABNORMAL HIGH (ref 70–99)

## 2020-05-17 MED ORDER — HEPARIN SODIUM (PORCINE) 1000 UNIT/ML IJ SOLN
INTRAMUSCULAR | Status: AC
  Filled 2020-05-17: qty 5

## 2020-05-17 MED ORDER — HEPARIN SODIUM (PORCINE) 1000 UNIT/ML IJ SOLN
INTRAMUSCULAR | Status: AC
  Administered 2020-05-17: 5000 [IU]
  Filled 2020-05-17: qty 5

## 2020-05-17 MED ORDER — DOXERCALCIFEROL 4 MCG/2ML IV SOLN
INTRAVENOUS | Status: AC
  Administered 2020-05-17: 6 ug via INTRAVENOUS
  Filled 2020-05-17: qty 4

## 2020-05-17 MED ORDER — HEPARIN SODIUM (PORCINE) 1000 UNIT/ML DIALYSIS
5000.0000 [IU] | INTRAMUSCULAR | Status: DC | PRN
  Filled 2020-05-17: qty 5

## 2020-05-17 NOTE — Progress Notes (Signed)
Physical Therapy Note  Patient Details  Name: Jonathon Bailey MRN: [id number] Date of Birth: June 30, 1956 Today's Date: 05/17/2020  Pt's plan of care adjusted to 15/7 after speaking with care team and discussed with Algis Liming, PA, as pt currently unable to tolerate current therapy schedule with OT and PT due to hypotension, nausea, and HD schedule.   [place], DPT   05/17/2020, 12:22 PM

## 2020-05-17 NOTE — Progress Notes (Signed)
[facility] Progress Note   Subjective:  Seen on dialysis, no c/o today  Objective Vitals:   05/16/20 1934 05/17/20 0252 05/17/20 1132 05/17/20 1343  BP: 124/65 106/61  93/66  Pulse: 80 77  65  Resp: 19 18  18   Temp: 97.8 F (36.6 C) 98.5 F (36.9 C)  97.9 F (36.6 C)  TempSrc:    Oral  SpO2: 96% 96% 95% 90%  Weight:    123.7 kg  Height:       Physical Exam General: Well appearing man, NAD Heart: RRR; no murmur Lungs: CTAB Abdomen: soft, non-tender Extremities: 1+ RLE edema, L BKA bandaged Dialysis Access: TDC in L chest, C/D/I  Additional Objective Labs: Basic Metabolic Panel: Recent Labs  Lab 05/12/20 1444 05/14/20 1210 05/17/20 1420  NA 130* 129* 129*  K 4.0 4.4 4.0  CL 92* 89* 91*  CO2 26 24 24   GLUCOSE 122* 139* 131*  BUN 38* 34* 44*  CREATININE 9.77* 9.70* 11.05*  CALCIUM 8.2* 8.2* 8.2*  PHOS 4.2 4.4 5.1*   Liver Function Tests: Recent Labs  Lab 05/12/20 1444 05/14/20 1210 05/17/20 1420  ALBUMIN 2.2* 2.4* 2.3*   CBC: Recent Labs  Lab 05/10/20 1521 05/12/20 1444 05/14/20 1210  WBC 12.9* 12.1* 12.2*  HGB 7.1* 7.2* 7.5*  HCT 25.0* 25.5* 25.9*  MCV 91.6 91.4 89.6  PLT 447* 404* 341   CBG: Recent Labs  Lab 05/16/20 1158 05/16/20 1653 05/16/20 2100 05/17/20 0558 05/17/20 1141  GLUCAP 109* 116* 144* 89 125*   Medications:  . (feeding supplement) PROSource Plus  30 mL Oral BID BM  . vitamin C  1,000 mg Oral BID  . aspirin  81 mg Oral QODAY  . atorvastatin  40 mg Oral QHS  . carvedilol  3.125 mg Oral BID WC  . cetaphil  1 application Topical Daily  . Chlorhexidine Gluconate Cloth  6 each Topical Q0600  . cinacalcet  90 mg Oral Q supper  . darbepoetin (ARANESP) injection - DIALYSIS  100 mcg Intravenous Q Thu-HD  . doxercalciferol  6 mcg Intravenous Q T,Th,Sa-HD  . ferrous gluconate  324 mg Oral BID WC  . heparin  5,000 Units Subcutaneous Q8H  . insulin aspart  0-15 Units Subcutaneous TID WC  . insulin glargine  20  Units Subcutaneous QHS  . melatonin  5 mg Oral QHS  . menthol-cetylpyridinium  1 lozenge Oral Daily  . multivitamin  1 tablet Oral QHS  . pantoprazole  40 mg Oral Daily  . polyethylene glycol  17 g Oral BID  . senna-docusate  2 tablet Oral BID  . sevelamer carbonate  4.8 g Oral TID WC  . umeclidinium bromide  1 puff Inhalation Daily    Dialysis Orders: TTS @ [place] 4.5h 400/800 126kg 2/2.25 bath Hep 12000bolusTDC -Mircera80mcg q 2wks - just reordered/not yet given,lastdose1/26/21 -Hectorol56mcg IV qHD  Assessment/Plan: 1. PAD with L leg ischemia + gangrene:X-ray w/o signs of osteomyelitis, s/p courseVanc + Zosyn.VVS consulted-> underwent BLE arteriogram 8/6showingsevere bilateralPAD below the knees -> then had L BKA 8/9 by Dr. Donnetta Hutching. Now in CIR. 2. ESRD:Continue HD per TTS schedule. HD today.  3. Hypotension/volume: Chronic low BP - Coregreduced. EDW will be lower on discharge.Some RLE edema, UF as tolerated. 4. AnemiaofESRD:Hgb 7.5.ContinueAranesp 182mcgq Thurs.No IV iron due to allergy-  on PO iron BID instead (tsat 6%, ferritin 218). 5. Secondary Hyperparathyroidism:CorrCa/Phos good.Continue VDRA, sensipar,and binders. 6. Nutrition- Renal diet w/fluid restrictions + supplements. 7. DMT2- Per primary 8. CAD s/p  CABG -Per primary 9. Hx CVA 10. COPD  [name], MD 05/17/2020, 2:57 PM

## 2020-05-17 NOTE — Progress Notes (Signed)
[facility] PHYSICAL MEDICINE & REHABILITATION PROGRESS NOTE   Subjective/Complaints: No issues overnight.   ROS: denies CP, SOB, N/V/D   Objective:   No results found. No results for input(s): WBC, HGB, HCT, PLT in the last 72 hours. No results for input(s): NA, K, CL, CO2, GLUCOSE, BUN, CREATININE, CALCIUM in the last 72 hours.  Intake/Output Summary (Last 24 hours) at 05/17/2020 1319 Last data filed at 05/17/2020 0700 Gross per 24 hour  Intake 420 ml  Output --  Net 420 ml     Physical Exam: Vital Signs Blood pressure 106/61, pulse 77, temperature 98.5 F (36.9 C), resp. rate 18, height 6' (1.829 m), weight 120.1 kg, SpO2 95 %. General: Alert and oriented x 3, No apparent distress HEENT: Head is normocephalic, atraumatic, PERRLA, EOMI, sclera anicteric, oral mucosa pink and moist, dentition intact, ext ear canals clear,  Neck: Supple without JVD or lymphadenopathy Heart: Reg rate and rhythm. No murmurs rubs or gallops Chest: CTA bilaterally without wheezes, rales, or rhonchi; no distress Abdomen: Soft, non-tender, non-distended, bowel sounds positive. Extremities: No clubbing, cyanosis, or edema. Pulses are 2+ Skin: Left BKA with dressing CDI Psych: Normal mood.  Normal behavior. Musc: Left BKA with less edema, less tender Neuro: Alert Motor: Bilateral upper extremities: 5/5 proximal distal Left lower extremity: 4+/5 proximal distally Right lower extremity: Hip flexion 4/5 (pain inhibition)--improving  Assessment/Plan: 1. Functional deficits secondary to left BKA which require 3+ hours per day of interdisciplinary therapy in a comprehensive inpatient rehab setting.  Physiatrist is providing close team supervision and 24 hour management of active medical problems listed below.  Physiatrist and rehab team continue to assess barriers to discharge/monitor patient progress toward functional and medical goals  Care Tool:  Bathing  Bathing activity did not occur:  Refused Body parts bathed by patient: Left arm, Right arm, Chest, Abdomen, Front perineal area, Buttocks, Right upper leg, Left upper leg, Face   Body parts bathed by helper: Right lower leg Body parts n/a: Left lower leg   Bathing assist Assist Level: Contact Guard/Touching assist     Upper Body Dressing/Undressing Upper body dressing   What is the patient wearing?: Pull over shirt    Upper body assist Assist Level: Set up assist    Lower Body Dressing/Undressing Lower body dressing      What is the patient wearing?: Underwear/pull up, Pants     Lower body assist Assist for lower body dressing: Minimal Assistance - Patient > 75%     Toileting Toileting Toileting Activity did not occur (Clothing management and hygiene only): N/A (no void or bm)  Toileting assist Assist for toileting: Minimal Assistance - Patient > 75%     Transfers Chair/bed transfer  Transfers assist     Chair/bed transfer assist level: Supervision/Verbal cueing     Locomotion Ambulation   Ambulation assist   Ambulation activity did not occur: Safety/medical concerns  Assist level: 2 helpers (min A of 1 and +2 w/c follow) Assistive device: Parallel bars Max distance: 21ft   Walk 10 feet activity   Assist  Walk 10 feet activity did not occur: Safety/medical concerns        Walk 50 feet activity   Assist Walk 50 feet with 2 turns activity did not occur: Safety/medical concerns         Walk 150 feet activity   Assist Walk 150 feet activity did not occur: Safety/medical concerns         Walk 10 feet on uneven surface  activity   Assist Walk 10 feet on uneven surfaces activity did not occur: Safety/medical concerns         Wheelchair     Assist Will patient use wheelchair at discharge?: Yes Type of Wheelchair: Manual    Wheelchair assist level: Supervision/Verbal cueing, Set up assist Max wheelchair distance: 157ft    Wheelchair 50 feet with 2 turns  activity    Assist        Assist Level: Supervision/Verbal cueing, Set up assist   Wheelchair 150 feet activity     Assist      Assist Level: Moderate Assistance - Patient 50 - 74%   Blood pressure 106/61, pulse 77, temperature 98.5 F (36.9 C), resp. rate 18, height 6' (1.829 m), weight 120.1 kg, SpO2 95 %.    Medical Problem List and Plan: 1.  Limitations with mobility, transfers, endurance, self-care secondary to left BKA.  Continue CIR  Plan to order stump shrinker next week 2.  Antithrombotics: -DVT/anticoagulation:  Pharmaceutical: Heparin             -antiplatelet therapy: ASA daily  3. Pain Management: Oxycodone as needed   Controlled with meds 8/24. Monitor with increased exertion. 4. Mood: Team support             -antipsychotic agents: N/a 5. Neuropsych: This patient is capable of making decisions on his own behalf.  Discussed with Neuropsych, appreciate recs 6. Skin/Wound Care: Daily dressing changes. Monitor wound for healing.  Vitamin C for healing.  Prosource added to promote wound healing.  7. Fluids/Electrolytes/Nutrition: Monitor I/O.  Modified/renal diet with 1200 ccFR/day. Labs monitored with HD.  Patient still upset about diet as heart healthy, CM, renal diet recommended.  8.  T2DM with hyperglycemia:  Hgb A1c- 7.9.  Continue Lantus 20 units daily (On levemir 75 mg bid/Novolog 5 untis bid PTA- reports eats better at home). Continue to monitor BS ac/hs and use SSI for elevated blood sugars. CBG (last 3)  Recent Labs    05/16/20 2100 05/17/20 0558 05/17/20 1141  GLUCAP 144* 89 125*  Well controlled 8/24  9.  ESRD: Schedule hemodialysis at the end of the day on TTS to help with tolerance of activity.  Metabolic bone disease managed with Hectorol and Renvela. 10.  COPD: Respiratory status stable on Incruse daily. With congestion- CXR shows atelectasis. Encourage use of IS.  11.  Anemia of chronic disease: Serial CBCs with hemodialysis.  On IV  Aranesp weekly with Wynonia Lawman twice daily.  Hemoglobin 7.2 on 8/19, 7.5 on 8/21  Continue to monitor 12.  History of CAD: Carvedilol decreased due to hypotension --continue Coreg 3.125 mg bid with Lipitor and ASA. 13.  Hypertension  See #12   Vitals:   05/17/20 0252 05/17/20 1132  BP: 106/61   Pulse: 77   Resp: 18   Temp: 98.5 F (36.9 C)   SpO2: 96% 95%  Controlled HR wnl, monitor for orthostasis on HD days  BP well controlled 14.  Leukocytosis  WBC 12.1 on 8/19  Afebrile  Continue to monitor 15.  Drug-induced constipation  Abdominal x-ray unremarkable  Bowel meds increased on 8/18--had bm 8/19  Patient noncompliant with bowel meds 16. GERD PPI 40mg  qd LOS: 11 days A FACE TO FACE EVALUATION WAS PERFORMED  Danne Scardina P Robbi Spells 05/17/2020, 1:19 PM

## 2020-05-17 NOTE — Progress Notes (Signed)
Occupational Therapy Session Note  Patient Details  Name: Jonathon Bailey MRN: [id number] Date of Birth: 04/09/1956  Today's Date: 05/17/2020 OT Individual Time: 9702-6378  OT Individual Time Calculation (min): 56 min    Short Term Goals: Week 2:  OT Short Term Goal 1 (Week 2): Pt will complete toilet transfers supervision OT Short Term Goal 2 (Week 2): Pt will complete toileting tasks with CGA for standing balance while completing clothing management OT Short Term Goal 3 (Week 2): Pt will don pants with CGA via sit > stand vs lateral leans for increased independence  Skilled Therapeutic Interventions/Progress Updates:    Treatment session with focus on functional transfers, sit > stand, and dynamic standing balance during self-care retraining tasks.  Pt received seated upright on EOB reporting pain 5/10 but declining pain meds.  Pt reports need to toilet.  Completed lateral scoot CGA to drop arm BSC next to bed with therapist stabilizing BSC. Pt doffed pants with lateral leans on BSC.  Pt with no BM this attempt.  Completed hygiene while seated on BSC with lateral leans to ensure thoroughness when washing buttocks.  Lateral scoot/squat pivot transfer back to EOB with close supervision.  Pt completed remainder of bathing seated at EOB with setup.  Pt donned shirt with setup assist and then donned pants and underwear with min assist.  Pt completed sit > stand from elevated EOB with increased time and CGA, while therapist stabilized RW.  Pt pulled pants up on Rt side but required assistance to pull up on Lt and in back while maintaining standing with RW with CGA to close supervision.  Pt completed lateral scoot transfer bed > w/c with CGA and stabilization of w/c.  Pt completed grooming tasks in sitting at sink.  Pt remained upright in w/c with seat belt alarm on and all needs in reach.   Therapy Documentation Precautions:  Precautions Precautions: Fall Precaution Comments: recent L  BKA Restrictions Weight Bearing Restrictions: Yes LLE Weight Bearing: Non weight bearing Pain:  Pt with c/o pain 5/10.  Declined any pain meds. No increase in pain during session    Therapy/Group: Individual Therapy  [name] 05/17/2020, 8:15 AM

## 2020-05-17 NOTE — Progress Notes (Signed)
Physical Therapy Session Note  Patient Details  Name: Jonathon Bailey MRN: [id number] Date of Birth: 14-Aug-1956  Today's Date: 05/17/2020 PT Individual Time: 0900-1000 PT Individual Time Calculation (min): 60 min   Short Term Goals: Week 1:  PT Short Term Goal 1 (Week 1): pt to demonstrate supine<>sit at min A x1 PT Short Term Goal 1 - Progress (Week 1): Met PT Short Term Goal 2 (Week 1): pt to demonstrate transfers with LRAD at min A x1 PT Short Term Goal 2 - Progress (Week 1): Met PT Short Term Goal 4 (Week 1): pt to initiate standing with RW PT Short Term Goal 4 - Progress (Week 1): Met PT Short Term Goal 5 (Week 1): pt to mobilize WC 150' min A PT Short Term Goal 5 - Progress (Week 1): Progressing toward goal Week 2:  PT Short Term Goal 1 (Week 2): Pt will transfer bed<>chair with LRAD CGA PT Short Term Goal 2 (Week 2): Pt will transfer sit<>stand with LRAD CGA PT Short Term Goal 3 (Week 2): Pt will initiate gait training  Skilled Therapeutic Interventions/Progress Updates:   Received pt sitting in WC, pt agreeable to therapy, and reported pain 6/10 in L residual limb but requested to wait on pain medication due to feeling nauseous. Repositioning, rest breaks, and distraction done to reduce pain levels. Session with emphasis on functional mobility/transfers, generalized strengthening, dynamic standing balance/coordination, and improved activity tolerance. Pt performed WC mobility 27ft using bilateral UEs and supervision and transported to therapy gym remainder of way for energy conservation purposes. Pt transferred WC<>mat via lateral scoot with supervision with therapist stabilizing WC. Pt transferred sit<>stand with RW and min A from elevated mat x 4 trials and worked on dynamic standing balance, fine motor control, and standing tolerance constructing pictures with pipelines x 2 trials with CGA for balance alternating using one UE at a time with the other UE on RW for support. Pt  reported the lights in the gym were too bright but politely declined going to another room. Pt reported feeling nauseous throughout session but was motivated to continue working during session. Pt performed the following exercises seated on mat with supervision and verbal cues for technique: -single arm bicep curls with 10lb dumbbell 2x12 bilaterally -single arm overhead chest press with 7lb dumbbell x10 bilaterally -trunk rotation with 4.4lb medicine ball 2x8 bilaterally  -knee extension with 5lb ankle weight on R LE 2x10 bilaterally -hip flexion with 5lb ankle weight on R LE x3 reps (stopped due to increased pressure on abdomen causing increase in nausea symptoms) Pt transferred mat<>WC via lateral scoot with supervision and transported back to room in [facility] total A and transferred WC<>bed<>recliner via lateral scoot with supervision. Concluded session with pt sitting in recliner, needs within reach, and seatbelt alarm on. Pillow positioned under L residual limb to encourage knee extension.   Therapy Documentation Precautions:  Precautions Precautions: Fall Precaution Comments: recent L BKA Restrictions Weight Bearing Restrictions: Yes LLE Weight Bearing: Non weight bearing   Therapy/Group: Individual Therapy Alfonse Alpers PT, DPT   05/17/2020, 7:23 AM

## 2020-05-17 NOTE — Progress Notes (Signed)
Occupational Therapy Session Note  Patient Details  Name: Jonathon Bailey MRN: [id number] Date of Birth: 11/20/1955  Today's Date: 05/17/2020 OT Individual Time: 4888-9169 OT Individual Time Calculation (min): 24 min  and Today's Date: 05/17/2020 OT Missed Time: 51 Minutes Missed Time Reason: Patient ill (comment);Patient fatigue (nausea and low BP)   Short Term Goals: Week 2:  OT Short Term Goal 1 (Week 2): Pt will complete toilet transfers supervision OT Short Term Goal 2 (Week 2): Pt will complete toileting tasks with CGA for standing balance while completing clothing management OT Short Term Goal 3 (Week 2): Pt will don pants with CGA via sit > stand vs lateral leans for increased independence  Skilled Therapeutic Interventions/Progress Updates:    Limited treatment session secondary to pt reports of nausea and noted low BP.  Pt received upright in recliner with reports of fatigue and nausea and feeling "low".  Pt appearing to have decreased energy from this AM session as evidenced by increased time to respond and overall lethargic appearing.  Therapist assessed BP 89/59 in sitting at rest.  Discussed with RN and PA who encouraged pt to rest as he is to have HD this afternoon.  Algis Liming, PA, also recommends decrease in therapy frequency due to hypotension and HD schedule.  Therefore order to be placed for 15/7 therapy schedule.  Returned to room, discussed current plan with pt.  Pt requests to remain upright in recliner until closer to time for HD due to prolonged time in bed for HD.    Therapy Documentation Precautions:  Precautions Precautions: Fall Precaution Comments: recent L BKA Restrictions Weight Bearing Restrictions: Yes LLE Weight Bearing: Non weight bearing General: General OT Amount of Missed Time: 51 Minutes Vital Signs: Oxygen Therapy SpO2: 95 % O2 Device: Room Air Pain:  Pt with no c/o pain, reports nausea   Therapy/Group: Individual Therapy  [name] 05/17/2020, 12:09 PM

## 2020-05-18 ENCOUNTER — Inpatient Hospital Stay (HOSPITAL_COMMUNITY): Payer: No Typology Code available for payment source

## 2020-05-18 ENCOUNTER — Inpatient Hospital Stay (HOSPITAL_COMMUNITY): Payer: No Typology Code available for payment source | Admitting: Occupational Therapy

## 2020-05-18 DIAGNOSIS — Z992 Dependence on renal dialysis: Secondary | ICD-10-CM

## 2020-05-18 DIAGNOSIS — N186 End stage renal disease: Secondary | ICD-10-CM

## 2020-05-18 DIAGNOSIS — T7840XA Allergy, unspecified, initial encounter: Secondary | ICD-10-CM | POA: Insufficient documentation

## 2020-05-18 DIAGNOSIS — D649 Anemia, unspecified: Secondary | ICD-10-CM

## 2020-05-18 DIAGNOSIS — T782XXA Anaphylactic shock, unspecified, initial encounter: Secondary | ICD-10-CM | POA: Insufficient documentation

## 2020-05-18 LAB — GLUCOSE, CAPILLARY
Glucose-Capillary: 128 mg/dL — ABNORMAL HIGH (ref 70–99)
Glucose-Capillary: 119 mg/dL — ABNORMAL HIGH (ref 70–99)
Glucose-Capillary: 138 mg/dL — ABNORMAL HIGH (ref 70–99)
Glucose-Capillary: 119 mg/dL — ABNORMAL HIGH (ref 70–99)

## 2020-05-18 LAB — TYPE AND SCREEN
ABO/RH(D): O POS
Antibody Screen: NEGATIVE
Unit division: 0
Unit division: 0

## 2020-05-18 LAB — BPAM RBC
Blood Product Expiration Date: 202108312359
Blood Product Expiration Date: 202109242359
Unit Type and Rh: 5100
Unit Type and Rh: 5100

## 2020-05-18 LAB — PREPARE RBC (CROSSMATCH)

## 2020-05-18 MED ORDER — MENTHOL 3 MG MT LOZG: 1.0000 | LOZENGE | OROMUCOSAL | Status: DC | PRN

## 2020-05-18 MED ORDER — OXYCODONE HCL 5 MG PO TABS
5.0000 mg | ORAL_TABLET | ORAL | Status: DC | PRN
  Administered 2020-05-20 (×2): 5 mg via ORAL
  Filled 2020-05-18 (×2): qty 1

## 2020-05-18 MED ORDER — SORBITOL 70 % SOLN
30.0000 mL | Freq: Every day | Status: DC
  Filled 2020-05-18 (×6): qty 30

## 2020-05-18 MED ORDER — SODIUM CHLORIDE 0.9% IV SOLUTION: Freq: Once | INTRAVENOUS | Status: DC

## 2020-05-18 NOTE — Progress Notes (Signed)
[facility] Progress Note   Subjective:  Seen on dialysis, no c/o. Per rehab PA pt having sig fatigue and some orthostatic BP drops. Will need prbc's today  Objective Vitals:   05/18/20 0359 05/18/20 0843 05/18/20 0907 05/18/20 1213  BP: 122/69  (!) 100/56 100/62  Pulse: 83 72 75 74  Resp: 16 17 16 18   Temp: 98.3 F (36.8 C)  98.4 F (36.9 C) 98.7 F (37.1 C)  TempSrc:      SpO2: 100% 100% 97% 100%  Weight:      Height:       Physical Exam General: Well appearing man, NAD Heart: RRR; no murmur Lungs: CTAB Abdomen: soft, non-tender Extremities: no edema, L BKA bandaged Dialysis Access: TDC in L chest, C/D/I  Additional Objective Labs: Basic Metabolic Panel: Recent Labs  Lab 05/12/20 1444 05/14/20 1210 05/17/20 1420  NA 130* 129* 129*  K 4.0 4.4 4.0  CL 92* 89* 91*  CO2 26 24 24   GLUCOSE 122* 139* 131*  BUN 38* 34* 44*  CREATININE 9.77* 9.70* 11.05*  CALCIUM 8.2* 8.2* 8.2*  PHOS 4.2 4.4 5.1*   Liver Function Tests: Recent Labs  Lab 05/12/20 1444 05/14/20 1210 05/17/20 1420  ALBUMIN 2.2* 2.4* 2.3*   CBC: Recent Labs  Lab 05/12/20 1444 05/14/20 1210 05/17/20 1421  WBC 12.1* 12.2* 10.5  HGB 7.2* 7.5* 6.2*  HCT 25.5* 25.9* 22.2*  MCV 91.4 89.6 91.7  PLT 404* 341 291   CBG: Recent Labs  Lab 05/17/20 1141 05/17/20 1850 05/17/20 2100 05/18/20 0607 05/18/20 1125  GLUCAP 125* 86 112* 128* 119*   Medications:  . (feeding supplement) PROSource Plus  30 mL Oral BID BM  . sodium chloride   Intravenous Once  . vitamin C  1,000 mg Oral BID  . aspirin  81 mg Oral QODAY  . atorvastatin  40 mg Oral QHS  . carvedilol  3.125 mg Oral BID WC  . cetaphil  1 application Topical Daily  . Chlorhexidine Gluconate Cloth  6 each Topical Q0600  . cinacalcet  90 mg Oral Q supper  . darbepoetin (ARANESP) injection - DIALYSIS  100 mcg Intravenous Q Thu-HD  . doxercalciferol  6 mcg Intravenous Q T,Th,Sa-HD  . ferrous gluconate  324 mg Oral BID WC   . heparin  5,000 Units Subcutaneous Q8H  . insulin aspart  0-15 Units Subcutaneous TID WC  . insulin glargine  20 Units Subcutaneous QHS  . melatonin  5 mg Oral QHS  . multivitamin  1 tablet Oral QHS  . pantoprazole  40 mg Oral Daily  . polyethylene glycol  17 g Oral BID  . sevelamer carbonate  4.8 g Oral TID WC  . sorbitol  30 mL Oral Q breakfast  . umeclidinium bromide  1 puff Inhalation Daily    Dialysis: TTS [facility] 4.5h 400/800 126kg 2/2.25 bath Hep 12000bolusTDC -Mircera66mcg q 2wks - just reordered/not yet given,lastdose1/26/21 -Hectorol85mcg IV qHD  Assessment/Plan: 1. PAD with L leg ischemia + gangrene:X-ray w/o signs of osteomyelitis, s/p courseVanc + Zosyn.VVS consulted-> underwent BLE arteriogram 8/6showingsevere bilateralPAD below the knees -> then had L BKA 8/9 by Dr. Donnetta Hutching. Now in CIR. 2. ESRD:Continue HD per TTS schedule. HD tomorrow.  3. Hypotension/volume: Chronic low BP - Coregreduced. EDW will be lower on dc. BP's soft today, min UF w/ HD tomorrow.  4. AnemiaofESRD:Hgb 7.5 > 6.4 on 8/24.  Pt is fatigued, +symptomatic anemia, have ordered 2u prbc's for today. ContinueAranesp 14mcgq Thurs.No IV iron due  to allergy. Pt confirms "itching" on several occassions, doesn't want IV iron and is on PO iron BID instead (tsat 6%, ferritin 218). 5. Secondary Hyperparathyroidism:CorrCa/Phos good.Continue VDRA, sensipar,and binders. 6. Nutrition- Renal diet w/fluid restrictions + supplements. 7. DMT2- Per primary 8. CAD s/p CABG -Per primary 9. Hx CVA 10. COPD  [name], MD 05/18/2020, 12:58 PM

## 2020-05-18 NOTE — Progress Notes (Signed)
First unit of PRBC infusion complete at 1719. Vital signs WNL. No s/s of reaction noted. Pt stable and at baseline.   Gerald Stabs, RN

## 2020-05-18 NOTE — Progress Notes (Signed)
Orthopedic Tech Progress Note Patient Details:  Jonathon Bailey [birth year] [id number] Called in order to [facility] for 2 BKA SHRINKERS Patient ID: Jonathon Bailey, male   DOB: Jan 27, 1956, 64 y.o.   MRN: [id number]   Janit Pagan 05/18/2020, 10:41 AM

## 2020-05-18 NOTE — NC FL2 (Signed)
[place] LEVEL OF CARE SCREENING TOOL     IDENTIFICATION  Patient Name: Jonathon Bailey Birthdate: 1955-12-22 Sex: male Admission Date (Current Location): 05/06/2020  [facility] and [place] Number:  Herbalist and Address:  [facility] [place], [place] [address] [place], [place] [address]      Provider Number: [id number]  Attending Physician Name and Address:  Jamse Arn, MD  Relative Name and Phone Number:  Rogue Jury [phone]    Current Level of Care: Hospital Recommended Level of Care: [place] Prior Approval Number:    Date Approved/Denied:   PASRR Number: [id number]  Discharge Plan: SNF    Current Diagnoses: Patient Active Problem List   Diagnosis Date Noted  . ESRD on dialysis ([place])   . Constipation   . Noncompliance   . Abdominal distension   . Drug induced constipation   . Leukocytosis   . Essential hypertension   . Labile blood pressure   . Controlled type 2 diabetes mellitus with hyperglycemia, with long-term current use of insulin ([place])   . Unilateral complete BKA, left, initial encounter ([place]) 05/06/2020  . Postoperative pain   . Acute on chronic anemia   . Anemia of chronic disease   . S/P BKA (below knee amputation) unilateral, left ([place])   . Sepsis without septic shock ([place]) 04/28/2020  . Wet gangrene ([place]) 04/28/2020  . Diabetic retinopathy ([place]) 08/21/2016  . Other specified fever   . Generalized abdominal pain   . Acute lower GI bleeding   . ESRD (end stage renal disease) ([place])   . Bacterial peritonitis ([place])   . Blood poisoning   . ESRD on peritoneal dialysis ([place])   . Acute blood loss anemia   . Abdominal pain 09/01/2014  . CAPD (continuous ambulatory peritoneal dialysis) status 03/15/2014  . Morbid obesity ([place]) 02/01/2014  . 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 Preventative health care 12/02/2013  . Other  complications due to renal dialysis device, implant, and graft 10/24/2012  . COPD (chronic obstructive pulmonary disease) ([place]) 10/23/2012  . DIABETIC PERIPHERAL NEUROPATHY 01/09/2010  . ESRD (end stage renal disease) on dialysis ([place]) 08/30/2009  . CEREBROVASCULAR ACCIDENT, HX OF 08/30/2009  . Diabetes mellitus type 2 with complications ([place]) 48/18/5631  . HYPERLIPIDEMIA 09/21/2008  . OBSTRUCTIVE SLEEP APNEA 09/21/2008  . ESSENTIAL HYPERTENSION 09/21/2008  . Coronary atherosclerosis 09/21/2008  . Chronic combined systolic and diastolic heart failure ([place]) 09/21/2008    Orientation RESPIRATION BLADDER Height & Weight     Self, Time, Situation, Place  Normal Continent Weight: 267 lb 3.2 oz (121.2 kg) Height:  6' (182.9 cm)  BEHAVIORAL SYMPTOMS/MOOD NEUROLOGICAL BOWEL NUTRITION STATUS      Continent Diet (renal diet 1200 fluid restriction)  AMBULATORY STATUS COMMUNICATION OF NEEDS Skin   Extensive Assist Verbally Surgical wounds                       Personal Care Assistance Level of Assistance  Bathing, Dressing Bathing Assistance: Limited assistance Feeding assistance: Independent Dressing Assistance: Limited assistance     Functional Limitations Info             SPECIAL CARE FACTORS FREQUENCY  PT (By licensed PT), OT (By licensed OT)     PT Frequency: 5x week OT Frequency: 5x week            Contractures Contractures Info: Not present  Additional Factors Info  Code Status, Allergies Code Status Info: Full Code Allergies Info: Ferumoxytol, Iodinated Diagnostic Agents           Current Medications (05/18/2020):  This is the current hospital active medication list Current Facility-Administered Medications  Medication Dose Route Frequency Provider Last Rate Last Admin  . (feeding supplement) PROSource Plus liquid 30 mL  30 mL Oral BID BM Stephania Fragmin R, PA-C   30 mL at 05/18/20 1011  . 0.9 %  sodium chloride infusion (Manually program via  Guardrails IV Fluids)   Intravenous Once Roney Jaffe, MD      . acetaminophen (TYLENOL) tablet 325-650 mg  325-650 mg Oral Q4H PRN Bary Leriche, PA-C   650 mg at 05/17/20 1212  . aluminum hydroxide (AMPHOJEL/ALTERNAGEL) suspension 10 mL  10 mL Oral Q4H PRN Love, Pamela S, PA-C      . ascorbic acid (VITAMIN C) tablet 1,000 mg  1,000 mg Oral BID Reesa Chew S, PA-C   1,000 mg at 05/18/20 0731  . aspirin chewable tablet 81 mg  81 mg Oral Len Childs, PA-C   81 mg at 05/17/20 1024  . atorvastatin (LIPITOR) tablet 40 mg  40 mg Oral QHS Bary Leriche, PA-C   40 mg at 05/17/20 2111  . bisacodyl (DULCOLAX) suppository 10 mg  10 mg Rectal Daily PRN Bary Leriche, PA-C   10 mg at 05/07/20 0930  . carvedilol (COREG) tablet 3.125 mg  3.125 mg Oral BID WC Bary Leriche, PA-C   3.125 mg at 05/16/20 0744  . cetaphil lotion 1 application  1 application Topical Daily Bary Leriche, PA-C   1 application at 86/76/72 0947  . Chlorhexidine Gluconate Cloth 2 % PADS 6 each  6 each Topical Q0600 Bary Leriche, PA-C   6 each at 05/18/20 0617  . cinacalcet (SENSIPAR) tablet 90 mg  90 mg Oral Q supper Bary Leriche, PA-C   90 mg at 05/17/20 1854  . Darbepoetin Alfa (ARANESP) injection 100 mcg  100 mcg Intravenous Q Thu-HD Bary Leriche, PA-C   100 mcg at 05/12/20 1704  . diphenhydrAMINE (BENADRYL) 12.5 MG/5ML elixir 12.5-25 mg  12.5-25 mg Oral Q6H PRN Love, Pamela S, PA-C      . doxercalciferol (HECTOROL) injection 6 mcg  6 mcg Intravenous Q T,Th,Sa-HD Love, Pamela S, PA-C   6 mcg at 05/17/20 1705  . ferrous gluconate (FERGON) tablet 324 mg  324 mg Oral BID WC Bary Leriche, PA-C   324 mg at 05/18/20 0732  . guaiFENesin-dextromethorphan (ROBITUSSIN DM) 100-10 MG/5ML syrup 5-10 mL  5-10 mL Oral Q6H PRN Bary Leriche, PA-C   10 mL at 05/08/20 0754  . heparin injection 5,000 Units  5,000 Units Subcutaneous Q8H Bary Leriche, PA-C   5,000 Units at 05/18/20 0962  . insulin aspart (novoLOG) injection 0-15  Units  0-15 Units Subcutaneous TID WC Bary Leriche, PA-C   2 Units at 05/18/20 8366  . insulin glargine (LANTUS) injection 20 Units  20 Units Subcutaneous QHS Bary Leriche, PA-C   20 Units at 05/17/20 2116  . melatonin tablet 5 mg  5 mg Oral QHS Love, Pamela S, PA-C   5 mg at 05/17/20 2111  . menthol-cetylpyridinium (CEPACOL) lozenge 3 mg  1 lozenge Oral PRN Love, Pamela S, PA-C      . milk and molasses enema  1 enema Rectal Daily PRN Love, Ivan Anchors, PA-C      .  multivitamin (RENA-VIT) tablet 1 tablet  1 tablet Oral QHS Bary Leriche, PA-C   1 tablet at 05/17/20 2110  . oxyCODONE (Oxy IR/ROXICODONE) immediate release tablet 5 mg  5 mg Oral Q4H PRN Love, Pamela S, PA-C      . pantoprazole (PROTONIX) EC tablet 40 mg  40 mg Oral Daily Bary Leriche, PA-C   40 mg at 05/18/20 0732  . polyethylene glycol (MIRALAX / GLYCOLAX) packet 17 g  17 g Oral BID Cathlyn Parsons, PA-C   17 g at 05/18/20 0732  . prochlorperazine (COMPAZINE) tablet 5-10 mg  5-10 mg Oral Q6H PRN Bary Leriche, PA-C   10 mg at 05/16/20 2145   Or  . prochlorperazine (COMPAZINE) injection 5-10 mg  5-10 mg Intramuscular Q6H PRN Love, Pamela S, PA-C       Or  . prochlorperazine (COMPAZINE) suppository 12.5 mg  12.5 mg Rectal Q6H PRN Love, Pamela S, PA-C      . sevelamer carbonate (RENVELA) powder PACK 4.8 g  4.8 g Oral TID WC Love, Ivan Anchors, PA-C   4.8 g at 05/18/20 1206  . sorbitol 70 % solution 30 mL  30 mL Oral Q breakfast Love, Pamela S, PA-C      . tobramycin (TOBREX) 0.3 % ophthalmic solution 1 drop  1 drop Both Eyes Q6H PRN Love, Pamela S, PA-C      . umeclidinium bromide (INCRUSE ELLIPTA) 62.5 MCG/INH 1 puff  1 puff Inhalation Daily Jamse Arn, MD   1 puff at 05/18/20 2536     Discharge Medications: Please see discharge summary for a list of discharge medications.  Relevant Imaging Results:  Relevant Lab Results:   Additional Information Has had his COVID vaccines Goes to [place], [place] Sat. SSN:  [id number]  Elease Hashimoto, LCSW

## 2020-05-18 NOTE — Progress Notes (Signed)
Physical Therapy Session Note  Patient Details  Name: Jonathon Bailey MRN: [id number] Date of Birth: July 01, 1956  Today's Date: 05/18/2020 PT Individual Time: 1003-1043 and 5638-7564 PT Individual Time Calculation (min): 40 min and 42 min  Short Term Goals: Week 2:  PT Short Term Goal 1 (Week 2): Pt will transfer bed<>chair with LRAD CGA PT Short Term Goal 1 - Progress (Week 2): Met PT Short Term Goal 2 (Week 2): Pt will transfer sit<>stand with LRAD CGA PT Short Term Goal 2 - Progress (Week 2): Progressing toward goal PT Short Term Goal 3 (Week 2): Pt will initiate gait training PT Short Term Goal 3 - Progress (Week 2): Met Week 3:  PT Short Term Goal 1 (Week 3): Pt will transfer stand<>pivot with LRAD CGA PT Short Term Goal 2 (Week 3): Pt will transfer sit<>stand with LRAD CGA consistantly PT Short Term Goal 3 (Week 3): Pt will ambulate 92ft with LRAD min A of 1  Skilled Therapeutic Interventions/Progress Updates:  Treatment Session 1: 1003-1043 40 min Received pt sitting in recliner, pt agreeable to therapy, and reported pain in L residual limb but did not state pain number and declined pain interventions. Repositioning, rest breaks, and distraction done to reduce pain levels. Session with emphasis on functional mobility/transfers, generalized strengthening, dynamic standing balance/coordination, and improved activity tolerance. Pt reported minor lightheadedness; BP sitting in recliner 92/59 and HR 76bpm. Pt transferred recliner<>bed<>WC via lateral scoot with supervision with therapist stabilizing WC. RN present to administer medications and lab present to draw blood. Pt transported to therapy gym in [facility] total A for time management purposes. Pt transferred WC<>mat via lateral scoot with supervision x 2 additional trials throughout session. Transferred sit<>stand with RW and CGA/min A x 8 trials and worked on dynamic standing balance playing horseshoes with CGA for balance. Pt with no  reports of increased lightheadedness. Pt transported back to room in [facility] total A and WC<>bed via lateral scoot with supervision. Concluded session with pt sitting in bed, needs within reach, and bed alarm on. Pt awaiting blood transfusion.   Treatment Session 2: 3329-5188 42 min Received pt sitting in recliner, per RN pt had not received blood transfusion yet. Pt agreeable to therapy and reported low pain levels in L residual limb but did not state level and declined pain interventions. Repositioning, rest breaks, and distraction done to reduce pain levels. Session with emphasis on functional mobility/transfers, generalized strengthening, dynamic standing balance/coordination, WC mobility, and improved activity tolerance. Pt transferred recliner<>bed<>WC via lateral scoot with supervision and therapist stabilizing WC x 2 trials throughout session. Pt transferred sit<>stand with min A from elevated bed using RW and adjusted pants with CGA. Pt transported to dayroom in [facility] total A and transferred lateral scoot onto Nustep with supervision. Pt performed bilateral UE and R LE strengthening on Nustep at workload 3 for 8 minutes with 6 rest breaks in between for a total of 343 steps for improved cardiovascular endurance. Pt performed R LE strengthening on Kinetron at 20 cm/sec for 1 minute x 2 trials with therapist providing counter resistance x 1 trial with back support with emphasis on hamstring strengthening and x1 trial without back support with emphasis on glute strengthening and core control. Pt worked on [employer] mobility weaving in/out of cones with emphasis on control and navigating through narrow spaces. Pt transported back to room in [facility] total A. Concluded session with pt sitting in recliner, needs within reach, and seatbelt alarm on.   Therapy Documentation Precautions:  Precautions Precautions: Fall Precaution Comments: recent L BKA Restrictions Weight Bearing Restrictions: Yes LLE Weight Bearing: Non  weight bearing   Therapy/Group: Individual Therapy Alfonse Alpers PT, DPT   05/18/2020, 7:44 AM

## 2020-05-18 NOTE — Patient Care Conference (Signed)
Inpatient RehabilitationTeam Conference and Plan of Care Update Date: 05/18/2020   Time: 11:37 AM    Patient Name: Jonathon Bailey      Medical Record Number: [id number]  Date of Birth: 1956-06-01 Sex: Male         Room/Bed: 4W04C/4W04C-01 Payor Info: Payor: MEDICARE / Plan: MEDICARE PART A AND B / Product Type: *No Product type* /    Admit Date/Time:  05/06/2020  4:09 PM  Primary Diagnosis:  S/P BKA (below knee amputation) unilateral, left [facility])  Hospital Problems: Principal Problem:   S/P BKA (below knee amputation) unilateral, left (HCC) Active Problems:   Leukocytosis   Essential hypertension   Labile blood pressure   Controlled type 2 diabetes mellitus with hyperglycemia, with long-term current use of insulin (HCC)   Drug induced constipation   Abdominal distension   Constipation   Noncompliance   ESRD on dialysis [facility])    Expected Discharge Date: Expected Discharge Date:  (SNF pending)  Team Members Present: Physician leading conference: Dr. Delice Lesch Care Coodinator Present: Dorien Chihuahua, RN, BSN, CRRN;Other (comment) Jacqlyn Larsen [place], SW) Nurse Present: Mohammed Kindle, RN PT Present: Becky Sax, PT OT Present: [name], OT PPS Coordinator present : Ileana Ladd, Burna Mortimer, SLP     Current Status/Progress Goal Weekly Team Focus  Bowel/Bladder   Continent of bowel. LBM 08/24.Refusing laxatives at times. Anuric, HD 3 times/week.  remain continent of bowel.  Assess need for bowel meds.   Swallow/Nutrition/ Hydration             ADL's   CGA bathing with lateral leans, Setup UB dressing, Min-mod A LB dressing with lateral leans and sit > stand, CGA- Min A lateral scoot transfers  Min assist sit > stand, Supervision bathing, dressing, and toilet transfers/toileting  ADL retraining, activity tolerance/endurance, sit > stand, standing balance, transfers, care for residual limb   Mobility   bed mobility supervision, lateral scoot supervision, stand<>pivot  CGA/min A, gait 60ft in // bars min A, WC mobility 154ft supervision  supervision  functional mobility/transfers, generalized strengthening, dynamic standing balance/coordination, ambulation, endurance, D/C planning, limb loss education   Communication             Safety/Cognition/ Behavioral Observations  no unsafe behaviors         Pain   C/O pain to left BKA, but reports "trying to wean myself off of Oxy"  Pt will be pain free.  Assess pain every shift and prn and encourage patient to take oxy before therapies.   Skin   BKA with edema and small amount of bloody drainage, staples in place.  Assess skin qshift/PRN  Skin will be free of breakdown. Encourage patient to start learning how to change BKA dressing.     Discharge Planning:  If requires care will need to pursue short term NHP, due to does not have 24 hr care at home.Pt is looking at facilities and will begin NH process   Team Discussion: Stump shrinker on order. Continue to note BP low and Hgb down to 6.2 today; MD ordered transfusion. Min-Mod assist for ADLs, lateral scoots for transfers  Patient on target to meet rehab goals: yes  *See Care Plan and progress notes for long and short-term goals.   Revisions to Treatment Plan:  Therapy changed to 15/7 schedule Teaching Needs: Residual limb care, medications, transfers  Current Barriers to Discharge: Decreased caregiver support, Home enviroment access/layout, Wound care, Hemodialysis and Medication compliance  Possible Resolutions to Barriers: Recommended SNF  placement at discharge     Medical Summary Current Status: Limitations with mobility, transfers, endurance, self-care secondary to left BKA  Barriers to Discharge: Hemodialysis;Medical stability;Weight;Weight bearing restrictions;Wound care;Decreased family/caregiver support   Possible Resolutions to [employer] Focus: Therapies, optimize bowel meds, follow labs, optimize BP/DM meds, stump  shrinker   Continued Need for Acute Rehabilitation Level of Care: The patient requires daily medical management by a physician with specialized training in physical medicine and rehabilitation for the following reasons: Direction of a multidisciplinary physical rehabilitation program to maximize functional independence : Yes Medical management of patient stability for increased activity during participation in an intensive rehabilitation regime.: Yes Analysis of laboratory values and/or radiology reports with any subsequent need for medication adjustment and/or medical intervention. : Yes   I attest that I was present, lead the team conference, and concur with the assessment and plan of the team.   Dorien Chihuahua B 05/18/2020, 4:03 PM

## 2020-05-18 NOTE — Progress Notes (Signed)
Second unit of PRBCs started at 1816, no transfusion reaction noted within the first 15 min, VSS and tol well thus far. No c/o's at this time.  Gerald Stabs, RN

## 2020-05-18 NOTE — Progress Notes (Signed)
Physical Therapy Weekly Progress Note  Patient Details  Name: Jonathon Bailey MRN: [id number] Date of Birth: 1956-08-28  Beginning of progress report period: May 07, 2020 End of progress report period: May 18, 2020  Patient has met 2 of 3 short term goals. Pt demonstrates gradual progress towards long term goals. Pt currently requires supervision for bed mobility, supervision for lateral scoot transfers, min/mod A for stand<>pivot and sit<>stand transfers, supervision for WC mobility >193ft, and +2 assist to ambulate 35ft in parallel bars. However, pt continues to be limited by poor endurance, body habitus, decreased balance/postural control, and recent episodes of hypotension. Pt recently made 15/7 due to poor activity tolerance and difficulty maintaining current therapy schedule with HD. Pt's biggest barrier to D/C continues to be limited caregiver support. Pt is currently awaiting SNF placement.   Patient continues to demonstrate the following deficits muscle weakness, decreased cardiorespiratoy endurance and decreased standing balance, decreased postural control, decreased balance strategies and difficulty maintaining precautions and therefore will continue to benefit from skilled PT intervention to increase functional independence with mobility.  Patient progressing toward long term goals..  Continue plan of care.  PT Short Term Goals Week 2:  PT Short Term Goal 1 (Week 2): Pt will transfer bed<>chair with LRAD CGA PT Short Term Goal 1 - Progress (Week 2): Met PT Short Term Goal 2 (Week 2): Pt will transfer sit<>stand with LRAD CGA PT Short Term Goal 2 - Progress (Week 2): Progressing toward goal PT Short Term Goal 3 (Week 2): Pt will initiate gait training PT Short Term Goal 3 - Progress (Week 2): Met Week 3:  PT Short Term Goal 1 (Week 3): Pt will transfer stand<>pivot with LRAD CGA PT Short Term Goal 2 (Week 3): Pt will transfer sit<>stand with LRAD CGA consistantly PT Short  Term Goal 3 (Week 3): Pt will ambulate 2ft with LRAD min A of 1  Skilled Therapeutic Interventions/Progress Updates:  Community reintegration;Discharge planning;Disease management/prevention;Balance/vestibular training;DME/adaptive equipment instruction;Functional mobility training;Neuromuscular re-education;Psychosocial support;UE/LE Strength taining/ROM;Wheelchair propulsion/positioning;UE/LE Coordination activities;Therapeutic Activities;Skin care/wound management;Pain management;Patient/family education;Splinting/orthotics;Therapeutic Exercise   Therapy Documentation Precautions:  Precautions Precautions: Fall Precaution Comments: recent L BKA Restrictions Weight Bearing Restrictions: Yes LLE Weight Bearing: Non weight bearing  Alfonse Alpers PT, DPT  05/18/2020, 7:43 AM

## 2020-05-18 NOTE — Progress Notes (Signed)
Occupational Therapy Session Note  Patient Details  Name: Jonathon Bailey MRN: [id number] Date of Birth: 1955/12/20  Today's Date: 05/18/2020 OT Individual Time: 8299-3716 OT Individual Time Calculation (min): 62 min    Short Term Goals: Week 2:  OT Short Term Goal 1 (Week 2): Pt will complete toilet transfers supervision OT Short Term Goal 2 (Week 2): Pt will complete toileting tasks with CGA for standing balance while completing clothing management OT Short Term Goal 3 (Week 2): Pt will don pants with CGA via sit > stand vs lateral leans for increased independence  Skilled Therapeutic Interventions/Progress Updates:    Treatment session with focus on functional transfers, sit > stand, and BUE strengthening/endurance with functional mobility. Pt received upright in recliner with RN present administering medications.  Pt with multiple questions about meds as he has been more nauseous recently.  Completed lateral scoot/squat pivot transfers recliner > bed > w/c with pt demonstrating good anterior weight shift, therapist present for CGA and to stabilize w/c during transfer.  Engaged in sit > stand from elevated EOB x1 with CGA for pt to adjust pants. MD arrived and unwrapped residual limb to inspect.  Discussed shrinker sock for improved shaping and preparation for prosthesis, MD to order.  Therapist rewrapped residual limb with ACE wrap while educating on purpose of figure 8 wrapping technique.  Pt propelled w/c 50' at a time due to BUE fatigue and "tightness".  Educated on w/c mobility in community setting with accessing doorways and on/off elevators.  Pt reports pleased to be able to "get some fresh air".  Returned to room with pt propelling 50' x2.  Transferred lateral scoot w/c > recliner with CGA.  Pt remained upright in recliner with seat belt alarm on and all needs in reach.  Therapy Documentation Precautions:  Precautions Precautions: Fall Precaution Comments: recent L  BKA Restrictions Weight Bearing Restrictions: Yes LLE Weight Bearing: Non weight bearing General:   Vital Signs: Therapy Vitals Pulse Rate: 72 Resp: 17 Patient Position (if appropriate): Sitting Oxygen Therapy SpO2: 100 % O2 Device: Room Air Pain:  Pt with no rated pain, stated "I'll work through it"   Therapy/Group: Individual Therapy  [name] 05/18/2020, 8:48 AM

## 2020-05-18 NOTE — Progress Notes (Signed)
Patient ID: Jonathon Bailey, male   DOB: 08/13/1956, 64 y.o.   MRN: [id number]   Met with pt to discuss team conference goals and progress he is making progress but not at a level where he can return home from rehab. He is agreeable to begin the SNF process and begin looking for a bed. He has two choices-[facility] and [facility]. Will start paperwork

## 2020-05-18 NOTE — Plan of Care (Signed)
  Problem: RH Balance Goal: LTG Patient will maintain dynamic sitting balance (PT) Description: LTG:  Patient will maintain dynamic sitting balance with assistance during mobility activities (PT) Flowsheets (Taken 05/18/2020 0747) LTG: Pt will maintain dynamic sitting balance during mobility activities with:: (upgraded due to improved postural control/balance) Independent Note: upgraded due to improved postural control/balance   Problem: RH Bed Mobility Goal: LTG Patient will perform bed mobility with assist (PT) Description: LTG: Patient will perform bed mobility with assistance, with/without cues (PT). Flowsheets (Taken 05/18/2020 0747) LTG: Pt will perform bed mobility with assistance level of: (upgraded due to improved strength, core control, and endurance) Independent with assistive device  Note: upgraded due to improved strength, core control, and endurance

## 2020-05-18 NOTE — Progress Notes (Signed)
Blood transfusion started at 1411. No transfusion reaction noted within the first 15 mins. Denies any pain or discomfort at this time. Pt instructed to call if any s/s of reaction arise. Currently resting comfortably in recliner.  Gerald Stabs, RN

## 2020-05-18 NOTE — Progress Notes (Addendum)
[facility] PHYSICAL MEDICINE & REHABILITATION PROGRESS NOTE   Subjective/Complaints: Patient seen sitting up in a chair this morning working with therapies.  He states he slept well overnight.  He states he did not take some of his medications yesterday, attempted to educate patient to no avail.  Discussed plan for stump shrinker with patient and therapies.  ROS: Denies CP, SOB, N/V/D  Objective:   No results found. Recent Labs    05/17/20 1421  WBC 10.5  HGB 6.2*  HCT 22.2*  PLT 291   Recent Labs    05/17/20 1420  NA 129*  K 4.0  CL 91*  CO2 24  GLUCOSE 131*  BUN 44*  CREATININE 11.05*  CALCIUM 8.2*    Intake/Output Summary (Last 24 hours) at 05/18/2020 1016 Last data filed at 05/18/2020 0806 Gross per 24 hour  Intake 340 ml  Output 1500 ml  Net -1160 ml     Physical Exam: Vital Signs Blood pressure (!) 100/56, pulse 75, temperature 98.4 F (36.9 C), resp. rate 16, height 6' (1.829 m), weight 121.2 kg, SpO2 97 %. Constitutional: No distress . Vital signs reviewed. HENT: Normocephalic.  Atraumatic. Eyes: EOMI. No discharge. Cardiovascular: No JVD. Respiratory: Normal effort.  No stridor. GI: Non-distended. Skin: Left BKA with mild serosanguineous drainage along staple line Psych: Normal mood.  Normal behavior. Musc: Left BKA with improving edema and tenderness Neuro: Alert Motor: Bilateral upper extremities: 5/5 proximal distal Left lower extremity: 4+/5 proximal distally, unchanged Right lower extremity: Hip flexion 4/5 (pain inhibition), unchanged  Assessment/Plan: 1. Functional deficits secondary to left BKA which require 3+ hours per day of interdisciplinary therapy in a comprehensive inpatient rehab setting.  Physiatrist is providing close team supervision and 24 hour management of active medical problems listed below.  Physiatrist and rehab team continue to assess barriers to discharge/monitor patient progress toward functional and medical  goals  Care Tool:  Bathing  Bathing activity did not occur: Refused Body parts bathed by patient: Left arm, Right arm, Chest, Abdomen, Front perineal area, Buttocks, Right upper leg, Left upper leg, Face   Body parts bathed by helper: Right lower leg Body parts n/a: Left lower leg   Bathing assist Assist Level: Contact Guard/Touching assist     Upper Body Dressing/Undressing Upper body dressing   What is the patient wearing?: Pull over shirt    Upper body assist Assist Level: Set up assist    Lower Body Dressing/Undressing Lower body dressing      What is the patient wearing?: Underwear/pull up, Pants     Lower body assist Assist for lower body dressing: Minimal Assistance - Patient > 75%     Toileting Toileting Toileting Activity did not occur (Clothing management and hygiene only): N/A (no void or bm)  Toileting assist Assist for toileting: Minimal Assistance - Patient > 75%     Transfers Chair/bed transfer  Transfers assist     Chair/bed transfer assist level: Contact Guard/Touching assist     Locomotion Ambulation   Ambulation assist   Ambulation activity did not occur: Safety/medical concerns  Assist level: 2 helpers (min A of 1 and +2 w/c follow) Assistive device: Parallel bars Max distance: 50ft   Walk 10 feet activity   Assist  Walk 10 feet activity did not occur: Safety/medical concerns        Walk 50 feet activity   Assist Walk 50 feet with 2 turns activity did not occur: Safety/medical concerns  Walk 150 feet activity   Assist Walk 150 feet activity did not occur: Safety/medical concerns         Walk 10 feet on uneven surface  activity   Assist Walk 10 feet on uneven surfaces activity did not occur: Safety/medical concerns         Wheelchair     Assist Will patient use wheelchair at discharge?: Yes Type of Wheelchair: Manual    Wheelchair assist level: Supervision/Verbal cueing, Set up assist Max  wheelchair distance: 139ft    Wheelchair 50 feet with 2 turns activity    Assist        Assist Level: Supervision/Verbal cueing, Set up assist   Wheelchair 150 feet activity     Assist      Assist Level: Moderate Assistance - Patient 50 - 74%   Blood pressure (!) 100/56, pulse 75, temperature 98.4 F (36.9 C), resp. rate 16, height 6' (1.829 m), weight 121.2 kg, SpO2 97 %.    Medical Problem List and Plan: 1.  Limitations with mobility, transfers, endurance, self-care secondary to left BKA.  Continue CIR  Stump shrinker ordered  Team conference today to discuss current and goals and coordination of care, home and environmental barriers, and discharge planning with nursing, case manager, and therapies. Please see conference note from today as well.  2.  Antithrombotics: -DVT/anticoagulation:  Pharmaceutical: Heparin             -antiplatelet therapy: ASA daily  3. Pain Management: Oxycodone as needed   Controlled with meds 8/25  Monitor with increased exertion. 4. Mood: Team support             -antipsychotic agents: N/a 5. Neuropsych: This patient is capable of making decisions on his own behalf.  Discussed with Neuropsych, appreciate recs 6. Skin/Wound Care: Daily dressing changes. Monitor wound for healing.  Vitamin C for healing.  Prosource added to promote wound healing.  7. Fluids/Electrolytes/Nutrition: Monitor I/O.  Modified/renal diet with 1200 ccFR/day. Labs monitored with HD.  Patient still upset about diet as heart healthy, CM, renal diet recommended.  8.  T2DM with hyperglycemia:  Hgb A1c- 7.9.  Continue Lantus 20 units daily (On levemir 75 mg bid/Novolog 5 untis bid PTA- reports eats better at home). Continue to monitor BS ac/hs and use SSI for elevated blood sugars. CBG (last 3)  Recent Labs    05/17/20 1850 05/17/20 2100 05/18/20 0607  GLUCAP 86 112* 128*   Slightly elevated on 8/25 9.  ESRD: Schedule hemodialysis at the end of the day on TTS to  help with tolerance of activity.  Metabolic bone disease managed with Hectorol and Renvela. 10.  COPD: Respiratory status stable on Incruse daily. With congestion- CXR shows atelectasis. Encourage use of IS.  11.  Anemia of chronic disease: Serial CBCs with hemodialysis.  On IV Aranesp weekly with Wynonia Lawman twice daily.  Hemoglobin 6.2 on 8/24, discussed with Neprho, will transfuse 2U PRBCs due to significant drop in hemoglobin  Continue to monitor 12.  History of CAD: Carvedilol decreased due to hypotension --continue Coreg 3.125 mg bid with Lipitor and ASA. 13.  Hypertension  See #12   Vitals:   05/18/20 0843 05/18/20 0907  BP:  (!) 100/56  Pulse: 72 75  Resp: 17 16  Temp:  98.4 F (36.9 C)  SpO2: 100% 97%   Controlled on 8/25 14.  Leukocytosis: Resolved  WBC 10.5 on 8/24  Afebrile  Continue to monitor 15.  Drug-induced constipation  Abdominal x-ray  unremarkable  Bowel meds increased on 8/18  Some improvement  Patient noncompliant with bowel meds 16. GERD PPI 40mg  qd  LOS: 12 days A FACE TO FACE EVALUATION WAS PERFORMED  Jonathon Bailey Jonathon Bailey 05/18/2020, 10:16 AM

## 2020-05-19 ENCOUNTER — Inpatient Hospital Stay (HOSPITAL_COMMUNITY): Payer: No Typology Code available for payment source | Admitting: Physical Therapy

## 2020-05-19 ENCOUNTER — Inpatient Hospital Stay (HOSPITAL_COMMUNITY): Payer: No Typology Code available for payment source | Admitting: Occupational Therapy

## 2020-05-19 ENCOUNTER — Inpatient Hospital Stay (HOSPITAL_COMMUNITY): Payer: No Typology Code available for payment source | Admitting: *Deleted

## 2020-05-19 LAB — TYPE AND SCREEN
ABO/RH(D): O POS
Antibody Screen: NEGATIVE
Unit division: 0
Unit division: 0

## 2020-05-19 LAB — CBC
HCT: 25.9 % — ABNORMAL LOW (ref 39.0–52.0)
Hemoglobin: 7.4 g/dL — ABNORMAL LOW (ref 13.0–17.0)
MCH: 25 pg — ABNORMAL LOW (ref 26.0–34.0)
MCHC: 28.6 g/dL — ABNORMAL LOW (ref 30.0–36.0)
MCV: 87.5 fL (ref 80.0–100.0)
Platelets: 259 10*3/uL (ref 150–400)
RBC: 2.96 MIL/uL — ABNORMAL LOW (ref 4.22–5.81)
RDW: 20.7 % — ABNORMAL HIGH (ref 11.5–15.5)
WBC: 9.7 10*3/uL (ref 4.0–10.5)
nRBC: 0 % (ref 0.0–0.2)

## 2020-05-19 LAB — RENAL FUNCTION PANEL
Albumin: 2.3 g/dL — ABNORMAL LOW (ref 3.5–5.0)
Anion gap: 13 (ref 5–15)
BUN: 32 mg/dL — ABNORMAL HIGH (ref 8–23)
CO2: 24 mmol/L (ref 22–32)
Calcium: 7.9 mg/dL — ABNORMAL LOW (ref 8.9–10.3)
Chloride: 94 mmol/L — ABNORMAL LOW (ref 98–111)
Creatinine, Ser: 8.59 mg/dL — ABNORMAL HIGH (ref 0.61–1.24)
GFR calc Af Amer: 7 mL/min — ABNORMAL LOW (ref >60)
GFR calc non Af Amer: 6 mL/min — ABNORMAL LOW (ref >60)
Glucose, Bld: 100 mg/dL — ABNORMAL HIGH (ref 70–99)
Phosphorus: 3.9 mg/dL (ref 2.5–4.6)
Potassium: 3.6 mmol/L (ref 3.5–5.1)
Sodium: 131 mmol/L — ABNORMAL LOW (ref 135–145)

## 2020-05-19 LAB — GLUCOSE, CAPILLARY
Glucose-Capillary: 95 mg/dL (ref 70–99)
Glucose-Capillary: 103 mg/dL — ABNORMAL HIGH (ref 70–99)
Glucose-Capillary: 135 mg/dL — ABNORMAL HIGH (ref 70–99)

## 2020-05-19 LAB — BPAM RBC
Blood Product Expiration Date: 202108312359
Blood Product Expiration Date: 202108312359
ISSUE DATE / TIME: 202108251357
ISSUE DATE / TIME: 202108251801
Unit Type and Rh: 5100
Unit Type and Rh: 5100

## 2020-05-19 LAB — OCCULT BLOOD X 1 CARD TO LAB, STOOL: Fecal Occult Bld: NEGATIVE

## 2020-05-19 MED ORDER — DARBEPOETIN ALFA 100 MCG/0.5ML IJ SOSY
PREFILLED_SYRINGE | INTRAMUSCULAR | Status: AC
  Filled 2020-05-19: qty 0.5

## 2020-05-19 MED ORDER — HEPARIN SODIUM (PORCINE) 1000 UNIT/ML IJ SOLN
INTRAMUSCULAR | Status: AC
  Administered 2020-05-19: 1000 [IU]
  Filled 2020-05-19: qty 10

## 2020-05-19 MED ORDER — DOXERCALCIFEROL 4 MCG/2ML IV SOLN
INTRAVENOUS | Status: AC
  Filled 2020-05-19: qty 4

## 2020-05-19 NOTE — Progress Notes (Signed)
  Progress Note    05/19/2020 7:42 AM * No surgery found *  Subjective:  No major complaints   Vitals:   05/18/20 2154 05/19/20 0512  BP: 128/77 128/83  Pulse: 76 72  Resp: 18 19  Temp: 97.9 F (36.6 C) 98.2 F (36.8 C)  SpO2: 99% 95%   Physical Exam: Cardiac: regular Lungs: non labored Extremities:  Left BKA stump just dressed. Stump stocking in place Neurologic: alert and oriented  CBC    Component Value Date/Time   WBC 10.5 05/17/2020 1421   RBC 2.42 (L) 05/17/2020 1421   HGB 6.2 (LL) 05/17/2020 1421   HCT 22.2 (L) 05/17/2020 1421   PLT 291 05/17/2020 1421   MCV 91.7 05/17/2020 1421   MCH 25.6 (L) 05/17/2020 1421   MCHC 27.9 (L) 05/17/2020 1421   RDW 18.4 (H) 05/17/2020 1421   LYMPHSABS 1.0 04/29/2020 1737   MONOABS 0.6 04/29/2020 1737   EOSABS 0.0 04/29/2020 1737   BASOSABS 0.0 04/29/2020 1737    BMET    Component Value Date/Time   NA 129 (L) 05/17/2020 1420   K 4.0 05/17/2020 1420   CL 91 (L) 05/17/2020 1420   CO2 24 05/17/2020 1420   GLUCOSE 131 (H) 05/17/2020 1420   BUN 44 (H) 05/17/2020 1420   CREATININE 11.05 (H) 05/17/2020 1420   CREATININE 7.17 (H) 10/23/2012 1554   CALCIUM 8.2 (L) 05/17/2020 1420   GFRNONAA 4 (L) 05/17/2020 1420   GFRAA 5 (L) 05/17/2020 1420    INR    Component Value Date/Time   INR 1.4 (H) 05/02/2020 0345     Intake/Output Summary (Last 24 hours) at 05/19/2020 0742 Last data filed at 05/19/2020 7858 Gross per 24 hour  Intake 1291 ml  Output --  Net 1291 ml     Assessment/Plan:  64 y.o. male is s/p left below knee amputation. Doing well post op. Left BKA dressings clean, dry and intact. Stump Stocking in place. Continues to make progress in CIR. Pending SNF placement. If d/c he will need follow up for staple removal in 2 weeks  Karoline Caldwell, [facility] [phone] 05/19/2020 7:42 AM

## 2020-05-19 NOTE — Progress Notes (Signed)
Occupational Therapy Session Note  Patient Details  Name: Jonathon Bailey MRN: [id number] Date of Birth: 04-01-1956  Today's Date: 05/19/2020 OT Individual Time: 3151-7616 OT Individual Time Calculation (min): 64 min    Short Term Goals: Week 2:  OT Short Term Goal 1 (Week 2): Pt will complete toilet transfers supervision OT Short Term Goal 2 (Week 2): Pt will complete toileting tasks with CGA for standing balance while completing clothing management OT Short Term Goal 3 (Week 2): Pt will don pants with CGA via sit > stand vs lateral leans for increased independence  Skilled Therapeutic Interventions/Progress Updates:    Pt in recliner to start session, agreeable to therapy.  Had him complete stand pivot transfer to the wheelchair with mod assist for initial sit to stand.  Once in the wheelchair he was able to roll himself down to the dayroom with increased time and three rest breaks secondary to fatigue.  Once in the dayroom had pt engage in sit to stand and standing balance with use of the RW while playing the Wii with the RUE.  Min to mod assist for sit to stand with min assist for standing while releasing the RUE to complete bowling task.  Pt reporting increased fatigue and some pain in the left anterior thigh secondary to working so much on sit to stand this am.  He was able to tolerate standing intervals for approximately 1-2 mins before sitting to rest.  Finished session with return to the room and transfer back to the recliner stand pivot with the RW and mod assist.  Pt was left in the recliner with the call button and phone in reach awaiting lunch.    Therapy Documentation Precautions:  Precautions Precautions: Fall Precaution Comments: recent L BKA Restrictions Weight Bearing Restrictions: Yes LLE Weight Bearing: Non weight bearing  Pain: Pain Assessment Pain Scale: 0-10 Pain Score: 4  Pain Type: Surgical pain Pain Location: Leg Pain Orientation: Left Pain Descriptors /  Indicators: Discomfort Pain Onset: On-going Pain Intervention(s): Emotional support;Repositioned ADL: See Care Tool for some details of mobility  Therapy/Group: Individual Therapy  Pranav Lince OTR/L 05/19/2020, 12:55 PM

## 2020-05-19 NOTE — Progress Notes (Signed)
[facility] PHYSICAL MEDICINE & REHABILITATION PROGRESS NOTE   Subjective/Complaints: Has slight headache Slept alright Denies constipation Watching news  ROS: Denies CP, SOB, N/V/D  Objective:   No results found. Recent Labs    05/17/20 1421  WBC 10.5  HGB 6.2*  HCT 22.2*  PLT 291   Recent Labs    05/17/20 1420  NA 129*  K 4.0  CL 91*  CO2 24  GLUCOSE 131*  BUN 44*  CREATININE 11.05*  CALCIUM 8.2*    Intake/Output Summary (Last 24 hours) at 05/19/2020 0913 Last data filed at 05/19/2020 7782 Gross per 24 hour  Intake 1191 ml  Output --  Net 1191 ml     Physical Exam: Vital Signs Blood pressure 128/83, pulse 91, temperature 98.2 F (36.8 C), resp. rate 19, height 6' (1.829 m), weight 121.2 kg, SpO2 97 %. General: Alert and oriented x 3, No apparent distress HEENT: Head is normocephalic, atraumatic, PERRLA, EOMI, sclera anicteric, oral mucosa pink and moist, dentition intact, ext ear canals clear,  Neck: Supple without JVD or lymphadenopathy Heart: Reg rate and rhythm. No murmurs rubs or gallops Chest: CTA bilaterally without wheezes, rales, or rhonchi; no distress Abdomen: Soft, non-tender, non-distended, bowel sounds positive. Extremities: No clubbing, cyanosis, or edema. Pulses are 2+ Skin: Left BKA with mild serosanguineous drainage along staple line Psych: Normal mood.  Normal behavior. Musc: Left BKA with improving edema and tenderness Neuro: Alert Motor: Bilateral upper extremities: 5/5 proximal distal Left lower extremity: 4+/5 proximal distally, unchanged Right lower extremity: Hip flexion 4/5 (pain inhibition), unchanged   Assessment/Plan: 1. Functional deficits secondary to left BKA which require 3+ hours per day of interdisciplinary therapy in a comprehensive inpatient rehab setting.  Physiatrist is providing close team supervision and 24 hour management of active medical problems listed below.  Physiatrist and rehab team continue to assess  barriers to discharge/monitor patient progress toward functional and medical goals  Care Tool:  Bathing  Bathing activity did not occur: Refused Body parts bathed by patient: Left arm, Right arm, Chest, Abdomen, Front perineal area, Buttocks, Right upper leg, Left upper leg, Face   Body parts bathed by helper: Right lower leg Body parts n/a: Left lower leg   Bathing assist Assist Level: Contact Guard/Touching assist     Upper Body Dressing/Undressing Upper body dressing   What is the patient wearing?: Pull over shirt    Upper body assist Assist Level: Set up assist    Lower Body Dressing/Undressing Lower body dressing      What is the patient wearing?: Underwear/pull up, Pants     Lower body assist Assist for lower body dressing: Minimal Assistance - Patient > 75%     Toileting Toileting Toileting Activity did not occur (Clothing management and hygiene only): N/A (no void or bm)  Toileting assist Assist for toileting: Minimal Assistance - Patient > 75%     Transfers Chair/bed transfer  Transfers assist     Chair/bed transfer assist level: Supervision/Verbal cueing     Locomotion Ambulation   Ambulation assist   Ambulation activity did not occur: Safety/medical concerns  Assist level: 2 helpers (min A of 1 and +2 w/c follow) Assistive device: Parallel bars Max distance: 67ft   Walk 10 feet activity   Assist  Walk 10 feet activity did not occur: Safety/medical concerns        Walk 50 feet activity   Assist Walk 50 feet with 2 turns activity did not occur: Safety/medical concerns  Walk 150 feet activity   Assist Walk 150 feet activity did not occur: Safety/medical concerns         Walk 10 feet on uneven surface  activity   Assist Walk 10 feet on uneven surfaces activity did not occur: Safety/medical concerns         Wheelchair     Assist Will patient use wheelchair at discharge?: Yes Type of Wheelchair: Manual     Wheelchair assist level: Supervision/Verbal cueing, Set up assist Max wheelchair distance: 60    Wheelchair 50 feet with 2 turns activity    Assist        Assist Level: Supervision/Verbal cueing, Set up assist   Wheelchair 150 feet activity     Assist      Assist Level: Moderate Assistance - Patient 50 - 74%   Blood pressure 128/83, pulse 91, temperature 98.2 F (36.8 C), resp. rate 19, height 6' (1.829 m), weight 121.2 kg, SpO2 97 %.    Medical Problem List and Plan: 1.  Limitations with mobility, transfers, endurance, self-care secondary to left BKA.  Continue CIR  Stump shrinker ordered  2.  Antithrombotics: -DVT/anticoagulation:  Pharmaceutical: Heparin             -antiplatelet therapy: ASA daily  3. Pain Management: Oxycodone as needed   Controlled 8/26  Monitor with increased exertion. 4. Mood: Team support             -antipsychotic agents: N/a 5. Neuropsych: This patient is capable of making decisions on his own behalf.  Discussed with Neuropsych, appreciate recs 6. Skin/Wound Care: Daily dressing changes. Monitor wound for healing.  Vitamin C for healing.  Prosource added to promote wound healing.  7. Fluids/Electrolytes/Nutrition: Monitor I/O.  Modified/renal diet with 1200 ccFR/day. Labs monitored with HD.  Patient still upset about diet as heart healthy, CM, renal diet recommended.  8.  T2DM with hyperglycemia:  Hgb A1c- 7.9.  Continue Lantus 20 units daily (On levemir 75 mg bid/Novolog 5 untis bid PTA- reports eats better at home). Continue to monitor BS ac/hs and use SSI for elevated blood sugars. CBG (last 3)  Recent Labs    05/18/20 1710 05/18/20 2059 05/19/20 0559  GLUCAP 138* 119* 95   Well controlled 8/26 9.  ESRD: Schedule hemodialysis at the end of the day on TTS to help with tolerance of activity.  Metabolic bone disease managed with Hectorol and Renvela. 10.  COPD: Respiratory status stable on Incruse daily. With congestion- CXR  shows atelectasis. Encourage use of IS. Breathing comfortably.  11.  Anemia of chronic disease: Serial CBCs with hemodialysis.  On IV Aranesp weekly with Wynonia Lawman twice daily.  Hemoglobin 6.2 on 8/24, discussed with Neprho, will transfuse 2U PRBCs due to significant drop in hemoglobin  Continue to monitor 12.  History of CAD: Carvedilol decreased due to hypotension --continue Coreg 3.125 mg bid with Lipitor and ASA. 13.  Hypertension  See #12   Vitals:   05/19/20 0512 05/19/20 0900  BP: 128/83   Pulse: 72 91  Resp: 19 19  Temp: 98.2 F (36.8 C)   SpO2: 95% 97%   Controlled on 8/25 14.  Leukocytosis: Resolved  WBC 10.5 on 8/24  Afebrile  Continue to monitor 15.  Drug-induced constipation  Abdominal x-ray unremarkable  Bowel meds increased on 8/18  Some improvement  Patient noncompliant with bowel meds 16. GERD PPI 40mg  qd  LOS: 13 days A FACE TO FACE EVALUATION WAS PERFORMED  Clide Deutscher Thayer Inabinet 05/19/2020,  9:13 AM

## 2020-05-19 NOTE — Progress Notes (Signed)
Physical Therapy Session Note  Patient Details  Name: Jonathon Bailey MRN: [id number] Date of Birth: 1955/10/22  Today's Date: 05/19/2020 PT Individual Time: 7342-8768 and 1003-1028 PT Individual Time Calculation (min): 56 min and 25 min.  Short Term Goals: Week 2:  PT Short Term Goal 1 (Week 2): Pt will transfer bed<>chair with LRAD CGA PT Short Term Goal 1 - Progress (Week 2): Met PT Short Term Goal 2 (Week 2): Pt will transfer sit<>stand with LRAD CGA PT Short Term Goal 2 - Progress (Week 2): Progressing toward goal PT Short Term Goal 3 (Week 2): Pt will initiate gait training PT Short Term Goal 3 - Progress (Week 2): Met  Skilled Therapeutic Interventions/Progress Updates:   First session: Pt presents sitting in recliner receiving meds.  Pt agreeable to therapy.  Recliner turned to perform lateral scoot to w/c w/ supervision and occasional verbal cues for forward lean to clear buttocks.  Pt wheeled self up to 60' x 3 trials w/ B UEs and R LE.  Pt required rest break between trials.Pt performed seated UBE at Level 2 x 5' for 2 trials w/ 1' rest break between.  Pt performed w/o trunk support.  Pt performed lateral scoot transfer w/c <> mat table w/ supervision and assist for positioning.  Pt performed multiple sit to stand transfers from mat table w/ verbal cueing for initiation and able to use 1 hand on surface and 1 on RW w/ min A.  Pt able to stand w/ RW up to 2" before requiring rest.  Pt returned to room and transferred lateral scoot w/c > reclinerw/ supervision.  Pt remained in recliner w/ chair alarm on and all needs in reach.  Second session: Pt presents seated in recliner, c/o fatigue and required encouragement to participate in therapy.  Pt agreeable to seated there ex.  Pt performed LAQ, hip flexion, isometric add, GS 3 x 15 w/ verbal cues for complete ROM.  Pt remained in recliner w/ chair alarm on and all needs in reach.     Therapy Documentation Precautions:   Precautions Precautions: Fall Precaution Comments: recent L BKA Restrictions Weight Bearing Restrictions: Yes LLE Weight Bearing: Non weight bearing General:   Vital Signs: Therapy Vitals Pulse Rate: 91 Resp: 19 Patient Position (if appropriate): Sitting Oxygen Therapy SpO2: 97 % O2 Device: Room Air Pain: 4/10 left residual limb, does not wish pain meds.      Therapy/Group: Individual Therapy  Ladoris Gene 05/19/2020, 9:13 AM

## 2020-05-19 NOTE — Progress Notes (Signed)
Pt wanting to cut tx short. Pt had 27 mins remaining of an 4:15 hour tx. [place] PA notified. Pt signed AMA.

## 2020-05-19 NOTE — Progress Notes (Signed)
[facility] Progress Note   Subjective:  2u prbc yest, Hb 7.4 up from 6.2.  FOB negative.    Objective Vitals:   05/19/20 1500 05/19/20 1530 05/19/20 1600 05/19/20 1630  BP: (!) 102/54 118/69 (!) 146/92 134/78  Pulse: 72 81 79 72  Resp:      Temp:      TempSrc:      SpO2:      Weight:      Height:       Physical Exam General: Well appearing man, NAD Heart: RRR; no murmur Lungs: CTAB Abdomen: soft, non-tender Extremities: no edema, L BKA bandaged Dialysis Access: TDC in L chest, C/D/I  Additional Objective Labs: Basic Metabolic Panel: Recent Labs  Lab 05/14/20 1210 05/17/20 1420 05/19/20 1322  NA 129* 129* 131*  K 4.4 4.0 3.6  CL 89* 91* 94*  CO2 24 24 24   GLUCOSE 139* 131* 100*  BUN 34* 44* 32*  CREATININE 9.70* 11.05* 8.59*  CALCIUM 8.2* 8.2* 7.9*  PHOS 4.4 5.1* 3.9   Liver Function Tests: Recent Labs  Lab 05/14/20 1210 05/17/20 1420 05/19/20 1322  ALBUMIN 2.4* 2.3* 2.3*   CBC: Recent Labs  Lab 05/14/20 1210 05/17/20 1421 05/19/20 1322  WBC 12.2* 10.5 9.7  HGB 7.5* 6.2* 7.4*  HCT 25.9* 22.2* 25.9*  MCV 89.6 91.7 87.5  PLT 341 291 259   CBG: Recent Labs  Lab 05/18/20 1125 05/18/20 1710 05/18/20 2059 05/19/20 0559 05/19/20 1105  GLUCAP 119* 138* 119* 95 103*   Medications:  Marland Kitchen Darbepoetin Alfa      . doxercalciferol      . (feeding supplement) PROSource Plus  30 mL Oral BID BM  . sodium chloride   Intravenous Once  . vitamin C  1,000 mg Oral BID  . aspirin  81 mg Oral QODAY  . atorvastatin  40 mg Oral QHS  . carvedilol  3.125 mg Oral BID WC  . cetaphil  1 application Topical Daily  . Chlorhexidine Gluconate Cloth  6 each Topical Q0600  . cinacalcet  90 mg Oral Q supper  . darbepoetin (ARANESP) injection - DIALYSIS  100 mcg Intravenous Q Thu-HD  . doxercalciferol  6 mcg Intravenous Q T,Th,Sa-HD  . ferrous gluconate  324 mg Oral BID WC  . heparin  5,000 Units Subcutaneous Q8H  . insulin aspart  0-15 Units  Subcutaneous TID WC  . insulin glargine  20 Units Subcutaneous QHS  . melatonin  5 mg Oral QHS  . multivitamin  1 tablet Oral QHS  . pantoprazole  40 mg Oral Daily  . polyethylene glycol  17 g Oral BID  . sevelamer carbonate  4.8 g Oral TID WC  . sorbitol  30 mL Oral Q breakfast  . umeclidinium bromide  1 puff Inhalation Daily    Dialysis: TTS [facility] 4.5h 400/800 126kg 2/2.25 bath Hep 12000bolusTDC -Mircera11mcg q 2wks - just reordered/not yet given,lastdose1/26/21 -Hectorol3mcg IV qHD  Assessment/Plan: 1. PAD with L leg ischemia + gangrene:X-ray w/o signs of osteomyelitis, s/p courseVanc + Zosyn.VVS consulted-> underwent BLE arteriogram 8/6showingsevere bilateralPAD below the knees -> then had L BKA 8/9 by Dr. Donnetta Hutching. Now in CIR. 2. ESRD:Continue HD per TTS schedule. HD today in progress. 3. Hypotension/volume: Chronic low BP - Coregreduced. EDW will be lower on dc. BP's soft today, min UF w/ HD tomorrow.  4. AnemiaofESRD:Hgb 7.5 > 6.4 and got 2u prbc on 8/25, Hb up to 7.4 this am. ContinueAranesp 147mcgq Thurs.No IV iron due to allergy.  Pt confirms "itching" on several occassions, doesn't want IV iron and is on PO iron BID instead (tsat 6%, ferritin 218). 5. Secondary Hyperparathyroidism:CorrCa/Phos good.Continue VDRA, sensipar,and binders. 6. Nutrition- Renal diet w/fluid restrictions + supplements. 7. DMT2- Per primary 8. CAD s/p CABG -Per primary 9. Hx CVA 10. COPD  [name], MD 05/19/2020, 4:51 PM

## 2020-05-20 ENCOUNTER — Inpatient Hospital Stay (HOSPITAL_COMMUNITY): Payer: No Typology Code available for payment source

## 2020-05-20 ENCOUNTER — Inpatient Hospital Stay (HOSPITAL_COMMUNITY): Payer: No Typology Code available for payment source | Admitting: Occupational Therapy

## 2020-05-20 LAB — CBC
HCT: 28.8 % — ABNORMAL LOW (ref 39.0–52.0)
Hemoglobin: 8.2 g/dL — ABNORMAL LOW (ref 13.0–17.0)
MCH: 25.2 pg — ABNORMAL LOW (ref 26.0–34.0)
MCHC: 28.5 g/dL — ABNORMAL LOW (ref 30.0–36.0)
MCV: 88.6 fL (ref 80.0–100.0)
Platelets: 293 10*3/uL (ref 150–400)
RBC: 3.25 MIL/uL — ABNORMAL LOW (ref 4.22–5.81)
RDW: 20.3 % — ABNORMAL HIGH (ref 11.5–15.5)
WBC: 8.3 10*3/uL (ref 4.0–10.5)
nRBC: 0 % (ref 0.0–0.2)

## 2020-05-20 LAB — GLUCOSE, CAPILLARY
Glucose-Capillary: 116 mg/dL — ABNORMAL HIGH (ref 70–99)
Glucose-Capillary: 120 mg/dL — ABNORMAL HIGH (ref 70–99)
Glucose-Capillary: 135 mg/dL — ABNORMAL HIGH (ref 70–99)
Glucose-Capillary: 83 mg/dL (ref 70–99)

## 2020-05-20 MED ORDER — CHLORHEXIDINE GLUCONATE CLOTH 2 % EX PADS
6.0000 | MEDICATED_PAD | Freq: Every day | CUTANEOUS | Status: DC
  Administered 2020-05-21 – 2020-05-22 (×2): 6 via TOPICAL

## 2020-05-20 NOTE — Progress Notes (Signed)
Occupational Therapy Weekly Progress Note  Patient Details  Name: Jonathon Bailey MRN: [id number] Date of Birth: [birth year]  Beginning of progress report period: May 13, 2020 End of progress report period: May 20, 2020  Today's Date: 05/20/2020 OT Individual Time: 1740-8144 OT Individual Time Calculation (min): 60 min    Patient has met 2 of 3 short term goals and partly met 1 goal.  Pt is making steady progress towards goals.  Pt currently completes lateral scoot transfers at [facility] to close supervision with therapist stabilizing w/c due to tendency to shift during transfer.  Pt completes bathing and toileting with lateral leans for hygiene.  Pt continues to require assistance with LB dressing when completing at sit > stand level due to decreased dynamic standing balance secondary to BKA and body habitus.  Pt's family is unable to provide the supervision/assistance that he will require upon d/c, therefore recommend SNF for continued therapy services to increase independence prior to d/c home.  Patient continues to demonstrate the following deficits: muscle weakness,decreased cardiorespiratoy endurance,decreased attention, decreased awareness, decreased problem solving, decreased safety awareness and decreased memoryand decreased sitting balance, decreased standing balance, decreased postural control, decreased balance strategies and difficulty maintaining precautions and therefore will continue to benefit from skilled OT intervention to enhance overall performance with BADL and Reduce care partner burden.  Patient progressing toward long term goals..  Continue plan of care.  OT Short Term Goals Week 2:  OT Short Term Goal 1 (Week 2): Pt will complete toilet transfers supervision OT Short Term Goal 1 - Progress (Week 2): Met OT Short Term Goal 2 (Week 2): Pt will complete toileting tasks with CGA for standing balance while completing clothing management OT Short Term Goal 2 - Progress  (Week 2): Partly met OT Short Term Goal 3 (Week 2): Pt will don pants with CGA via sit > stand vs lateral leans for increased independence OT Short Term Goal 3 - Progress (Week 2): Met Week 3:  OT Short Term Goal 1 (Week 3): STG = LTGs due to remaining ELOS  Skilled Therapeutic Interventions/Progress Updates:    Treatment session with focus on self-care retraining with functional transfers, lateral leans, and sit > stand during bathing and dressing tasks. Pt received upright in recliner with RN present administering morning meds, agreeable to bathing and dressing.  Pt reports feeling "so so" this morning, but able to complete all tasks as requested by therapist.  Pt completed lateral scoot transfer recliner > EOB with close supervision.  Engaged in bathing from EOB with lateral leans to wash buttocks.  Pt completed LB dressing with ability to thread underwear and pants without assistance, able to stand with RW with CGA and able to pull underwear over hips in standing but required assistance to pull pants fully over hips while standing.  Pt donned Rt sock in modified circle sitting at EOB with encouragement to attempt.  Educated on care for residual limb as well as continued care for intact limb with frequent skin inspection, with pt reporting understanding. Pt completed lateral scoot/squat pivot to w/c with supervision.  Engaged in oral care in sitting at sink with setup for items.  Pt remained upright in w/c with seat belt alarm on and all needs in reach.  Therapy Documentation Precautions:  Precautions Precautions: Fall Precaution Comments: recent L BKA Restrictions Weight Bearing Restrictions: Yes LLE Weight Bearing: Non weight bearing Pain: Pain Assessment Pain Scale: 0-10 Pain Score: 7  Pain Location: Generalized   Therapy/Group: Individual Therapy  [name] 05/20/2020, 7:18 AM

## 2020-05-20 NOTE — Progress Notes (Signed)
Patient ID: Jonathon Bailey, male   DOB: Sep 22, 1956, 64 y.o.   MRN: [id number]   Was contacted by [facility] healthcare that pt has signed over his medicare as of 9/1 to [facility] which they and many other SNF's do not take. When informed pt of this he reported he saw on tv and called number to to switch and he did it Tuesday. When informed him of the benefit he wants to switch back. Contacted [facility] [phone] to dis-enroll him from this program that was suppose to take effect 9/1. Jonathon Bailey reports it will take three business days to do this. SNF is currently on hold until this is resolved. Team and MD aware now not going to [facility] on Monday hopefully he will be able to go on Wed. Work on discharge plan.

## 2020-05-20 NOTE — Progress Notes (Signed)
Physical Therapy Session Note  Patient Details  Name: Jonathon Bailey MRN: [id number] Date of Birth: [birth year]  Today's Date: 05/20/2020 PT Individual Time: 0900-0957 PT Individual Time Calculation (min): 57 min   Short Term Goals: Week 2:  PT Short Term Goal 1 (Week 2): Pt will transfer bed<>chair with LRAD CGA PT Short Term Goal 1 - Progress (Week 2): Met PT Short Term Goal 2 (Week 2): Pt will transfer sit<>stand with LRAD CGA PT Short Term Goal 2 - Progress (Week 2): Progressing toward goal PT Short Term Goal 3 (Week 2): Pt will initiate gait training PT Short Term Goal 3 - Progress (Week 2): Met  Skilled Therapeutic Interventions/Progress Updates:    Patient received sitting up in wc agreeable to PT. He denies pain in L LE stating he received his AM pain rx. Patient able to propel wc 4ft before requiring rest break due to fatigue and UE weakness. Patient able to remain standing for 2 mins at a time to complete rounds of [place] for dynamic standing balance tx. Patient able to complete STS with SBA and verbal cues for hand placement. Patient transferred to therapy mat via lateral scoot SPV and was able to complete seated therex with 4# AW LAQ 2x12 and HF 2x12. Patient able to transfer back to wc via lateral scoot with SPV. Patient able to complete dynamic B UE task without back support + ball toss 3x2 mins. Patient returned to room in wc, seatbelt alarm in place, call light within reach.    Therapy Documentation Precautions:  Precautions Precautions: Fall Precaution Comments: recent L BKA Restrictions Weight Bearing Restrictions: Yes LLE Weight Bearing: Non weight bearing   Therapy/Group: Individual Therapy  Karoline Caldwell, PT, DPT, CBIS 05/20/2020, 7:43 AM

## 2020-05-20 NOTE — Progress Notes (Signed)
Occupational Therapy Session Note  Patient Details  Name: Jonathon Bailey MRN: [id number] Date of Birth: 11/05/1955  Today's Date: 05/20/2020 OT Individual Time: 1130-1200 OT Individual Time Calculation (min): 30 min    Short Term Goals: Week 3:  OT Short Term Goal 1 (Week 3): STG = LTGs due to remaining ELOS  Skilled Therapeutic Interventions/Progress Updates:    Treatment session with focus on education regarding care for residual limb.  Pt received upright in w/c expressing desire to remain in room during session due to fatigue.  Engaged in education regarding care for residual limb with focus on limb inspection, massage, and desensitization strategies for pain management and preparation for prosthesis.  Therapist issued pt inspection mirror and reiterated importance of routine inspection of both residual limb as well as intact foot.  Provided pt with handouts about inspection.  Provided pt with handout including massage, tapping, and other desensitization strategies and reinforced importance of tasks to decrease phantom pain sensation as well as prepare for future prosthesis use.  Pt remained upright in w/c with seat belt alarm on and all needs in reach.  Therapy Documentation Precautions:  Precautions Precautions: Fall Precaution Comments: recent L BKA Restrictions Weight Bearing Restrictions: Yes LLE Weight Bearing: Non weight bearing Pain: Pain Assessment Pain Scale: 0-10 Pain Score: 2  Pain Type: Surgical pain Pain Location: Leg Pain Orientation: Left Pain Descriptors / Indicators: Aching Pain Onset: Gradual Patients Stated Pain Goal: 0 Pain Intervention(s): Medication (See eMAR);Repositioned;Elevated extremity Multiple Pain Sites: No   Therapy/Group: Individual Therapy  [name] 05/20/2020, 12:20 PM

## 2020-05-20 NOTE — Discharge Summary (Signed)
Physician Discharge Summary  Patient ID: Jonathon Bailey MRN: [id number] DOB/AGE: Oct 23, 1955 64 y.o.  Admit date: 05/06/2020 Discharge date: 05/27/2020  Discharge Diagnoses:  Principal Problem:   S/P BKA (below knee amputation) unilateral, left (HCC) Active Problems:   Leukocytosis   Essential hypertension   Labile blood pressure   Controlled type 2 diabetes mellitus with hyperglycemia, with long-term current use of insulin (HCC)   Drug induced constipation   Abdominal distension   Constipation   Noncompliance   ESRD on dialysis [facility])   Discharged Condition: stable   Significant Diagnostic Studies: DG Abd 1 View  Result Date: 05/10/2020 CLINICAL DATA:  Abdominal distension, nausea and vomiting, constipation EXAM: ABDOMEN - 1 VIEW COMPARISON:  05/01/2020 FINDINGS: Two supine frontal views of the abdomen and pelvis are obtained, excluding portions of the right flank by collimation. Bowel gas pattern is unremarkable without obstruction or ileus. No masses or abnormal calcifications. Bony structures are unremarkable. IMPRESSION: 1. Unremarkable bowel gas pattern. Electronically Signed   By: Randa Ngo M.D.   On: 05/10/2020 20:58   DG CHEST PORT 1 VIEW  Result Date: 05/07/2020 CLINICAL DATA:  Congestion of respiratory tract. EXAM: PORTABLE CHEST 1 VIEW COMPARISON:  April 04, 2018 FINDINGS: Multiple sternal wires are seen. There is stable left-sided venous catheter positioning. Mild atelectasis and/or scarring is again seen within the left lung base. There is no evidence of a pleural effusion or pneumothorax. The cardiac silhouette is borderline in size. The visualized skeletal structures are unremarkable. IMPRESSION: Mild left basilar atelectasis and/or scarring. Electronically Signed   By: Virgina Norfolk M.D.   On: 05/07/2020 16:50     Labs:  Basic Metabolic Panel: Recent Labs  Lab 05/21/20 1344 05/24/20 1353 05/26/20 1130  NA 133* 131* 133*  K 3.9 4.4 4.2  CL 96* 94*  97*  CO2 24 24 24   GLUCOSE 143* 111* 116*  BUN 31* 42* 38*  CREATININE 8.54* 9.74* 9.01*  CALCIUM 8.5* 8.4* 8.5*  PHOS 4.3 4.7* 4.9*    CBC: Recent Labs  Lab 05/22/20 0506 05/24/20 1353 05/26/20 1130  WBC 8.5 8.3 8.4  HGB 8.2* 8.1* 8.2*  HCT 28.8* 28.5* 29.3*  MCV 87.8 88.0 87.7  PLT 292 326 290    CBG: Recent Labs  Lab 05/26/20 1136 05/26/20 1252 05/26/20 1738 05/26/20 2117 05/27/20 0548  GLUCAP 88 75 111* 124* 109*    Brief HPI:   Jonathon Bailey is a 64 y.o. male with history of CAD s/p CABG, ESRD- TTS HD, T2DM who was admitted on 04/27/20 with sepsis due to wet gangrene on left foot. He was started on IV antibiotics with attempts at limb salvage. He was agreeable to undergo left BKA on 05/02/2020 by Dr. Donnetta Hutching. Postop did report some visual changes and CT head done was negative for acute changes. Hospital course significant for issues with hypotension, constipation, acute on chronic anemia as well as debility with DOE/tachycardia with activity. Therapy evaluations completed and CIR was recommended due to functional decline.   Hospital Course: Jonathon Bailey was admitted to rehab 05/06/2020 for inpatient therapies to consist of PT and OT at least three hours five days a week. Past admission physiatrist, therapy team and rehab RN have worked together to provide customized collaborative inpatient rehab. His blood pressures were monitored on TID basis and were noted to be on low side. Coreg has been adjusted to twice daily. Acute blood loss anemia also has been monitored and he have drop in H/H to  6.2/22.2. He was transfused with 2 units PRBCs with improvement in H&H. He continues on Aranesp weekly for supplementation and no signs of bleeding noted. He has been tolerating HD on TTS at end of the day. Abdominal pain due to OIC has resolved with titration of bowel meds. As constipation is resolved he has been refusing scheduled bowel program recommend titrating MiraLAX to twice  daily as needed if no BM in 2 to 3 days. Pain has been reasonably controlled with decreasing use of oxycodone on as needed basis. He has been afebrile during his stay.   P.o. intake has been variable due to dislike of dietary restrictions however his family has been providing meals from home for supplementation. His diabetes has been monitored with ac/hs CBG checks and BS are well controlled on Lantus 20 units daily--he can transition back to home levemir at discharge. His L-BKA incision has been monitored for healing and superficial skin loss noted on tibial prominence and anterior flap of skin edge. As this is not full-thickness, Dr. Donnetta Hutching recommends monitoring for evolutionary changes and contact office for worsening. He is to follow-up with vascular for staple removal on 09/09 in office after discharge. He has been making steady progress during his stay however continues to require supervision for safety. He has elected on SNF for progressive therapies and was discharged to [facility] on 05/27/20.    Rehab course: During patient's stay in rehab weekly team conferences were held to monitor patient's progress, set goals and discuss barriers to discharge. At admission, patient required max assist with ADLs and with mobility. He  has had improvement in activity tolerance, balance, postural control as well as ability to compensate for deficits. He requires set up assist with supervision for ADL tasks. He requires supervision for lateral transfers.He is able to propel his wheelchair for 25 feet. He is able to stand and hop in parallel bars with min assist for 5 feet x 2.      Disposition: Skilled nursing facility.  Diet: Carb modified medium/renal diet-1200 cc FR/day  Special Instructions: 1. Cleanse incision with soap and water, pat dry and cover with dry dressing. Change daily. Notify Dr. Donnetta Hutching for any changes in incision. 2. Monitor blood sugars ac/hs and use SSI per protocol.      Discharge Instructions    Ambulatory referral to Physical Medicine Rehab   Complete by: As directed    3-4 weeks follow up appt     Allergies as of 05/27/2020      Reactions   Ferumoxytol Itching      Iron Itching       Iodinated Diagnostic Agents Itching      Medication List    STOP taking these medications   Fish Oil 1000 MG Caps   glucose blood test strip   HYDROmorphone 2 MG tablet Commonly known as: Dilaudid   lidocaine 5 % ointment Commonly known as: XYLOCAINE   MIRCERA IJ   tiotropium 18 MCG inhalation capsule Commonly known as: SPIRIVA     TAKE these medications   (feeding supplement) PROSource Plus liquid Take 30 mLs by mouth 2 (two) times daily between meals.   acetaminophen 325 MG tablet Commonly known as: TYLENOL Take 650 mg by mouth every 6 (six) hours as needed for pain.   albuterol 108 (90 Base) MCG/ACT inhaler Commonly known as: VENTOLIN HFA Inhale 2 puffs into the lungs every 4 (four) hours as needed for wheezing. For wheezing   aluminum hydroxide 320 MG/5ML suspension Commonly  known as: AMPHOJEL/ALTERNAGEL Take 10 mLs by mouth every 4 (four) hours as needed for indigestion or heartburn.   ascorbic acid 1000 MG tablet Commonly known as: VITAMIN C Take 1 tablet (1,000 mg total) by mouth 2 (two) times daily.   aspirin 81 MG chewable tablet Chew 1 tablet (81 mg total) by mouth every other day.   atorvastatin 40 MG tablet Commonly known as: LIPITOR Take 40 mg by mouth at bedtime.   carvedilol 6.25 MG tablet Commonly known as: COREG Take 1 tablet (6.25 mg total) by mouth 2 (two) times daily with a meal. What changed:   medication strength  how much to take   cetaphil lotion Apply 1 application topically daily.   cinacalcet 30 MG tablet Commonly known as: SENSIPAR Take 3 tablets (90 mg total) by mouth daily with supper.   Dialyvite 800 0.8 MG Tabs Take 1 tablet by mouth daily.   diphenhydrAMINE 12.5 MG/5ML  elixir Commonly known as: BENADRYL Take 5-10 mLs (12.5-25 mg total) by mouth every 6 (six) hours as needed for itching.   ferrous gluconate 324 MG tablet Commonly known as: FERGON Take 1 tablet (324 mg total) by mouth 2 (two) times daily with a meal.   fluticasone 50 MCG/ACT nasal spray Commonly known as: FLONASE Place 2 sprays into both nostrils daily.   insulin detemir 100 UNIT/ML injection Commonly known as: Levemir Inject 0.2 mLs (20 Units total) into the skin at bedtime.   melatonin 5 MG Tabs Take 1 tablet (5 mg total) by mouth at bedtime.   oxyCODONE 5 MG immediate release tablet--Rx # 5 pills  Commonly known as: Oxy IR/ROXICODONE Take 1 tablet (5 mg total) by mouth daily as needed for severe pain.   pantoprazole 40 MG tablet Commonly known as: PROTONIX Take 1 tablet (40 mg total) by mouth daily.   polyethylene glycol 17 g packet Commonly known as: MIRALAX / GLYCOLAX Take 17 g by mouth daily.   sevelamer carbonate 2.4 g Pack Commonly known as: RENVELA Take 4.8 g by mouth 3 (three) times daily with meals. What changed:   how much to take  when to take this  additional instructions   umeclidinium bromide 62.5 MCG/INH Aepb Commonly known as: INCRUSE ELLIPTA Inhale 1 puff into the lungs daily. Start taking on: May 28, 2020       Contact information for follow-up providers    Jamse Arn, MD Follow up.   Specialty: Physical Medicine and Rehabilitation Why: office will call with follow up appointment Contact information: [address] [place] [address] [phone]        Rosetta Posner, MD. Call on 06/03/2020.   Specialties: Vascular Surgery, Cardiology Why: Appointment at 9:45 for staple removeal and for post op check Contact information: [place] [address] [phone]            Contact information for after-discharge care    [facility] .   Service: Skilled  Nursing Contact information: [address] [place] [phone]                  Signed: Bary Leriche 05/27/2020, 8:59 AM

## 2020-05-20 NOTE — Plan of Care (Signed)
  Problem: Consults Goal: RH LIMB LOSS PATIENT EDUCATION Description: Description: See Patient Education module for eduction specifics. Outcome: Progressing   Problem: RH BOWEL ELIMINATION Goal: RH STG MANAGE BOWEL WITH ASSISTANCE Description: STG Manage Bowel with mod I Assistance. Outcome: Progressing Goal: RH STG MANAGE BOWEL W/MEDICATION W/ASSISTANCE Description: STG Manage Bowel with Medication with mod I Assistance. Outcome: Progressing   Problem: RH SKIN INTEGRITY Goal: RH STG ABLE TO PERFORM INCISION/WOUND CARE W/ASSISTANCE Description: STG Able To Perform Incision/Wound Care With mod I Assistance. Outcome: Progressing   Problem: RH SAFETY Goal: RH STG ADHERE TO SAFETY PRECAUTIONS W/ASSISTANCE/DEVICE Description: STG Adhere to Safety Precautions With cues and reminders  Outcome: Progressing   Problem: RH PAIN MANAGEMENT Goal: RH STG PAIN MANAGED AT OR BELOW PT'S PAIN GOAL Description: Pain level less than 5 on scale of 0-10 Outcome: Progressing   Problem: RH KNOWLEDGE DEFICIT LIMB LOSS Goal: RH STG INCREASE KNOWLEDGE OF SELF CARE AFTER LIMB LOSS Description: Pt will be able to adhere to medication regimen, dietary and lifestyle modification to prevent complications related to DM and ESRD with mod I assist using handouts and booklets provided.  Pt will be able to perform dressing changes and wrap stump site with mod I assist.  Outcome: Progressing

## 2020-05-20 NOTE — Progress Notes (Signed)
Patient ID: Jonathon Bailey, male   DOB: 1956-01-03, 64 y.o.   MRN: [id number]  Bed offer via [facility] and pt has accepted, will plan for Monday. Will need COVID test on Sunday ordered. Therapists want to see pt early Monday prior to transfer to facility Team aware of this plan.

## 2020-05-20 NOTE — Progress Notes (Signed)
[facility] PHYSICAL MEDICINE & REHABILITATION PROGRESS NOTE   Subjective/Complaints: Patient seen sitting up at the edge of the bed this AM, working with therapies. Good sitting balance noted. He states he slept well overnight.  He was seen by Nephro yesterday, notes reviewed - no changes.  Communicated with Nephro regarding pt's Hb as well.  He states his shrinker feels tight, educated patient.   ROS: Denies CP, SOB, N/V/D  Objective:   No results found. Recent Labs    05/19/20 1322 05/20/20 0610  WBC 9.7 8.3  HGB 7.4* 8.2*  HCT 25.9* 28.8*  PLT 259 293   Recent Labs    05/17/20 1420 05/19/20 1322  NA 129* 131*  K 4.0 3.6  CL 91* 94*  CO2 24 24  GLUCOSE 131* 100*  BUN 44* 32*  CREATININE 11.05* 8.59*  CALCIUM 8.2* 7.9*    Intake/Output Summary (Last 24 hours) at 05/20/2020 1001 Last data filed at 05/20/2020 0746 Gross per 24 hour  Intake 380 ml  Output 840 ml  Net -460 ml     Physical Exam: Vital Signs Blood pressure 134/85, pulse 79, temperature 98.3 F (36.8 C), resp. rate 16, height 6' (1.829 m), weight 122.3 kg, SpO2 98 %. Constitutional: No distress . Vital signs reviewed. HENT: Normocephalic.  Atraumatic. Eyes: EOMI. No discharge. Cardiovascular: No JVD. Respiratory: Normal effort.  No stridor. GI: Non-distended. Skin: Left BKA with dressing CDI Psych: Normal mood.  Normal behavior. Musc: Left BKA with improving edema and tenderness.  Neuro: Alert Motor: Bilateral upper extremities: 5/5 proximal distal Left lower extremity: 4+/5 proximal distally, unchanged Right lower extremity: Hip flexion 4-4+/5 (pain inhibition)   Assessment/Plan: 1. Functional deficits secondary to left BKA which require 3+ hours per day of interdisciplinary therapy in a comprehensive inpatient rehab setting.  Physiatrist is providing close team supervision and 24 hour management of active medical problems listed below.  Physiatrist and rehab team continue to assess barriers  to discharge/monitor patient progress toward functional and medical goals  Care Tool:  Bathing  Bathing activity did not occur: Refused Body parts bathed by patient: Left arm, Right arm, Chest, Abdomen, Front perineal area, Buttocks, Right upper leg, Left upper leg, Face   Body parts bathed by helper: Right lower leg Body parts n/a: Left lower leg   Bathing assist Assist Level: Minimal Assistance - Patient > 75%     Upper Body Dressing/Undressing Upper body dressing   What is the patient wearing?: Pull over shirt    Upper body assist Assist Level: Set up assist    Lower Body Dressing/Undressing Lower body dressing      What is the patient wearing?: Underwear/pull up, Pants     Lower body assist Assist for lower body dressing: Contact Guard/Touching assist     Toileting Toileting Toileting Activity did not occur (Clothing management and hygiene only): N/A (no void or bm)  Toileting assist Assist for toileting: Minimal Assistance - Patient > 75%     Transfers Chair/bed transfer  Transfers assist     Chair/bed transfer assist level: Supervision/Verbal cueing     Locomotion Ambulation   Ambulation assist   Ambulation activity did not occur: Safety/medical concerns  Assist level: 2 helpers (min A of 1 and +2 w/c follow) Assistive device: Parallel bars Max distance: 39ft   Walk 10 feet activity   Assist  Walk 10 feet activity did not occur: Safety/medical concerns        Walk 50 feet activity   Assist Walk  50 feet with 2 turns activity did not occur: Safety/medical concerns         Walk 150 feet activity   Assist Walk 150 feet activity did not occur: Safety/medical concerns         Walk 10 feet on uneven surface  activity   Assist Walk 10 feet on uneven surfaces activity did not occur: Safety/medical concerns         Wheelchair     Assist Will patient use wheelchair at discharge?: Yes Type of Wheelchair: Manual     Wheelchair assist level: Supervision/Verbal cueing, Set up assist Max wheelchair distance: 60    Wheelchair 50 feet with 2 turns activity    Assist        Assist Level: Supervision/Verbal cueing, Set up assist   Wheelchair 150 feet activity     Assist      Assist Level: Moderate Assistance - Patient 50 - 74%   Blood pressure 134/85, pulse 79, temperature 98.3 F (36.8 C), resp. rate 16, height 6' (1.829 m), weight 122.3 kg, SpO2 98 %.    Medical Problem List and Plan: 1.  Limitations with mobility, transfers, endurance, self-care secondary to left BKA.  Continue CIR  Continue stump shrinker  2.  Antithrombotics: -DVT/anticoagulation:  Pharmaceutical: Heparin             -antiplatelet therapy: ASA daily  3. Pain Management: Oxycodone as needed   Controlled with meds on 8/27  Monitor with increased exertion. 4. Mood: Team support             -antipsychotic agents: N/a 5. Neuropsych: This patient is capable of making decisions on his own behalf.  Discussed with Neuropsych, appreciate recs 6. Skin/Wound Care: Daily dressing changes. Monitor wound for healing.  Vitamin C for healing.  Prosource added to promote wound healing.  7. Fluids/Electrolytes/Nutrition: Monitor I/O.  Modified/renal diet with 1200 ccFR/day. Labs monitored with HD.  Patient still upset about diet as heart healthy, CM, renal diet recommended.  8.  T2DM with hyperglycemia:  Hgb A1c- 7.9.  Continue Lantus 20 units daily (On levemir 75 mg bid/Novolog 5 untis bid PTA- reports eats better at home). Continue to monitor BS ac/hs and use SSI for elevated blood sugars. CBG (last 3)  Recent Labs    05/19/20 1105 05/19/20 2147 05/20/20 0623  GLUCAP 103* 135* 116*   Slightly elevated on 8/27 9.  ESRD: Schedule hemodialysis at the end of the day on TTS to help with tolerance of activity.  Metabolic bone disease managed with Hectorol and Renvela. 10.  COPD: Respiratory status stable on Incruse daily.  With congestion- CXR shows atelectasis. Encourage use of IS. Breathing comfortably.  11.  Anemia of chronic disease: Serial CBCs with hemodialysis.  On IV Aranesp weekly with Wynonia Lawman twice daily.  Hemoglobin 8.2 on 8/27, labs with HD  Continue to monitor 12.  History of CAD: Carvedilol decreased due to hypotension --continue Coreg 3.125 mg bid with Lipitor and ASA. 13.  Hypertension  See #12   Vitals:   05/20/20 0313 05/20/20 0730  BP: (!) 150/82 134/85  Pulse: 79 79  Resp: 16 16  Temp: 98.3 F (36.8 C) 98.3 F (36.8 C)  SpO2: 100% 98%   Slightly labile on 8/27 14.  Leukocytosis: Resolved  Afebrile  Continue to monitor 15.  Drug-induced constipation  Abdominal x-ray unremarkable  Bowel meds increased on 8/18  Some improvement  Patient noncompliant with bowel meds 16. GERD PPI 40mg  qd  LOS: 14  days A FACE TO FACE EVALUATION WAS PERFORMED  Sylvia Helms Lorie Phenix 05/20/2020, 10:01 AM

## 2020-05-21 ENCOUNTER — Inpatient Hospital Stay (HOSPITAL_COMMUNITY): Payer: No Typology Code available for payment source | Admitting: Occupational Therapy

## 2020-05-21 ENCOUNTER — Inpatient Hospital Stay (HOSPITAL_COMMUNITY): Payer: No Typology Code available for payment source | Admitting: Physical Therapy

## 2020-05-21 LAB — RENAL FUNCTION PANEL
Albumin: 2.3 g/dL — ABNORMAL LOW (ref 3.5–5.0)
Anion gap: 13 (ref 5–15)
BUN: 31 mg/dL — ABNORMAL HIGH (ref 8–23)
CO2: 24 mmol/L (ref 22–32)
Calcium: 8.5 mg/dL — ABNORMAL LOW (ref 8.9–10.3)
Chloride: 96 mmol/L — ABNORMAL LOW (ref 98–111)
Creatinine, Ser: 8.54 mg/dL — ABNORMAL HIGH (ref 0.61–1.24)
GFR calc Af Amer: 7 mL/min — ABNORMAL LOW (ref >60)
GFR calc non Af Amer: 6 mL/min — ABNORMAL LOW (ref >60)
Glucose, Bld: 143 mg/dL — ABNORMAL HIGH (ref 70–99)
Phosphorus: 4.3 mg/dL (ref 2.5–4.6)
Potassium: 3.9 mmol/L (ref 3.5–5.1)
Sodium: 133 mmol/L — ABNORMAL LOW (ref 135–145)

## 2020-05-21 LAB — GLUCOSE, CAPILLARY
Glucose-Capillary: 95 mg/dL (ref 70–99)
Glucose-Capillary: 81 mg/dL (ref 70–99)
Glucose-Capillary: 85 mg/dL (ref 70–99)
Glucose-Capillary: 129 mg/dL — ABNORMAL HIGH (ref 70–99)

## 2020-05-21 LAB — CBC
HCT: 26.6 % — ABNORMAL LOW (ref 39.0–52.0)
Hemoglobin: 7.6 g/dL — ABNORMAL LOW (ref 13.0–17.0)
MCH: 25 pg — ABNORMAL LOW (ref 26.0–34.0)
MCHC: 28.6 g/dL — ABNORMAL LOW (ref 30.0–36.0)
MCV: 87.5 fL (ref 80.0–100.0)
Platelets: 276 10*3/uL (ref 150–400)
RBC: 3.04 MIL/uL — ABNORMAL LOW (ref 4.22–5.81)
RDW: 19.6 % — ABNORMAL HIGH (ref 11.5–15.5)
WBC: 7.8 10*3/uL (ref 4.0–10.5)
nRBC: 0 % (ref 0.0–0.2)

## 2020-05-21 MED ORDER — HEPARIN SODIUM (PORCINE) 1000 UNIT/ML IJ SOLN
INTRAMUSCULAR | Status: AC
  Administered 2020-05-21: 4200 [IU]
  Filled 2020-05-21: qty 5

## 2020-05-21 MED ORDER — OXYCODONE HCL 5 MG PO TABS
ORAL_TABLET | ORAL | Status: AC
  Administered 2020-05-21: 5 mg via ORAL
  Filled 2020-05-21: qty 1

## 2020-05-21 MED ORDER — DOXERCALCIFEROL 4 MCG/2ML IV SOLN
INTRAVENOUS | Status: AC
  Administered 2020-05-21: 6 ug via INTRAVENOUS
  Filled 2020-05-21: qty 4

## 2020-05-21 MED ORDER — HEPARIN SODIUM (PORCINE) 1000 UNIT/ML IJ SOLN
INTRAMUSCULAR | Status: AC
  Administered 2020-05-21: 5000 [IU]
  Filled 2020-05-21: qty 5

## 2020-05-21 MED ORDER — OXYCODONE HCL 5 MG PO TABS
5.0000 mg | ORAL_TABLET | Freq: Four times a day (QID) | ORAL | Status: DC | PRN
  Administered 2020-05-22 – 2020-05-27 (×4): 5 mg via ORAL
  Filled 2020-05-21 (×4): qty 1

## 2020-05-21 NOTE — Progress Notes (Signed)
[facility] Progress Note   Subjective:   Patient seen and examined in room.  Reports nausea and "stomach ache" this AM following PT.  Denies vomiting, diarrhea, SOB, CP, weakness and fatigue.  Says he is thinking about skipping dialysis today, counseled on importance of compliance.    Objective Vitals:   05/20/20 0730 05/20/20 1300 05/20/20 1932 05/21/20 0547  BP: 134/85 (!) 148/86 114/77 130/79  Pulse: 79 76 79 75  Resp: 16 16 18 18   Temp: 98.3 F (36.8 C) 98.2 F (36.8 C) 98.7 F (37.1 C) 98.2 F (36.8 C)  TempSrc:  Oral    SpO2: 98%  97% 98%  Weight:      Height:       Physical Exam General: obese, well appearing male in NAD, sitting on bedside chair Heart:RRR, no MRG Lungs:CTAB anterolaterally  Abdomen:soft, obese, +BS, NTND Extremities:L BKA, trace RLE edema Dialysis Access: L IJ [facility]   Filed Weights   05/17/20 1824 05/19/20 1315 05/19/20 1713  Weight: 121.2 kg 124.8 kg 122.3 kg    Intake/Output Summary (Last 24 hours) at 05/21/2020 1141 Last data filed at 05/21/2020 0900 Gross per 24 hour  Intake 440 ml  Output --  Net 440 ml    Additional Objective Labs: Basic Metabolic Panel: Recent Labs  Lab 05/14/20 1210 05/17/20 1420 05/19/20 1322  NA 129* 129* 131*  K 4.4 4.0 3.6  CL 89* 91* 94*  CO2 24 24 24   GLUCOSE 139* 131* 100*  BUN 34* 44* 32*  CREATININE 9.70* 11.05* 8.59*  CALCIUM 8.2* 8.2* 7.9*  PHOS 4.4 5.1* 3.9   Liver Function Tests: Recent Labs  Lab 05/14/20 1210 05/17/20 1420 05/19/20 1322  ALBUMIN 2.4* 2.3* 2.3*   CBC: Recent Labs  Lab 05/14/20 1210 05/14/20 1210 05/17/20 1421 05/17/20 1421 05/19/20 1322 05/20/20 0610 05/21/20 0512  WBC 12.2*   < > 10.5   < > 9.7 8.3 7.8  HGB 7.5*   < > 6.2*   < > 7.4* 8.2* 7.6*  HCT 25.9*   < > 22.2*   < > 25.9* 28.8* 26.6*  MCV 89.6  --  91.7  --  87.5 88.6 87.5  PLT 341   < > 291   < > 259 293 276   < > = values in this interval not displayed.   CBG: Recent Labs  Lab  05/20/20 1110 05/20/20 1634 05/20/20 2119 05/21/20 0557 05/21/20 1121  GLUCAP 120* 135* 83 95 81    Medications:  . (feeding supplement) PROSource Plus  30 mL Oral BID BM  . sodium chloride   Intravenous Once  . vitamin C  1,000 mg Oral BID  . aspirin  81 mg Oral QODAY  . atorvastatin  40 mg Oral QHS  . carvedilol  3.125 mg Oral BID WC  . cetaphil  1 application Topical Daily  . Chlorhexidine Gluconate Cloth  6 each Topical Q0600  . Chlorhexidine Gluconate Cloth  6 each Topical Q0600  . cinacalcet  90 mg Oral Q supper  . darbepoetin (ARANESP) injection - DIALYSIS  100 mcg Intravenous Q Thu-HD  . doxercalciferol  6 mcg Intravenous Q T,Th,Sa-HD  . ferrous gluconate  324 mg Oral BID WC  . heparin  5,000 Units Subcutaneous Q8H  . insulin aspart  0-15 Units Subcutaneous TID WC  . insulin glargine  20 Units Subcutaneous QHS  . melatonin  5 mg Oral QHS  . multivitamin  1 tablet Oral QHS  . pantoprazole  40 mg Oral Daily  . polyethylene glycol  17 g Oral BID  . sevelamer carbonate  4.8 g Oral TID WC  . sorbitol  30 mL Oral Q breakfast  . umeclidinium bromide  1 puff Inhalation Daily    Dialysis Orders: T[facility] 4.5h 400/800 126kg 2/2.25 bath Hep 12000bolusTDC -Mircera57mcg q 2wks - just reordered/not yet given,lastdose1/26/21 -Hectorol34mcg IV qHD  Assessment/Plan: 1. PAD w/L leg ischemia +gangrene - Xray w/o signs osteomyelitis, s/p Vanc/Zosyn course. VVS consulted, b/l LE arteriogram 8/6 with severe b/l PAD below the knees, L BKA on 8/9 by Dr. Donnetta Hutching.  Now in CIR.  2. ESRD - on HD TTS.  HD today per regular schedule.  Last K3.6, use increase K bath. 3. Anemia of CKD- Hgb 7.6 today.  S/p 2 units pRBC 8/25.  Continue aranesp 163mcg qwk.  tsat 6%, ferritin 218 - No IV iron d/t allergy.   On PO iron BID.  4. Secondary hyperparathyroidism - CCa and phos at goal.  Continue VDRA, binders and sensipar.  5. HTN/volume - BP in goal this AM.  Chronically low BP.  Coreg  reduced. Plan for UF 1-2L as tolerated.  EDW will be lower on d/c.  Last post weight ~ 122.3kg 6. Nutrition - Renal diet w/fluid restrictions.  7. DMT2 8. CAD s/p CABG 9. Hx CVA 10. COPD  Jen Mow, PA-C [facility] 05/21/2020,11:41 AM  LOS: 15 days

## 2020-05-21 NOTE — Progress Notes (Signed)
Occupational Therapy Session Note  Patient Details  Name: Jonathon Bailey MRN: [id number] Date of Birth: [birth year]  Today's Date: 05/21/2020 OT Individual Time: 4098-1191 OT Individual Time Calculation (min): 60 min    Short Term Goals: Week 2:  OT Short Term Goal 1 (Week 2): Pt will complete toilet transfers supervision OT Short Term Goal 1 - Progress (Week 2): Met OT Short Term Goal 2 (Week 2): Pt will complete toileting tasks with CGA for standing balance while completing clothing management OT Short Term Goal 2 - Progress (Week 2): Partly met OT Short Term Goal 3 (Week 2): Pt will don pants with CGA via sit > stand vs lateral leans for increased independence OT Short Term Goal 3 - Progress (Week 2): Met  Week 3:  OT Short Term Goal 1 (Week 3): STG = LTGs due to remaining ELOS  Skilled Therapeutic Interventions/Progress Updates:    Pt received in recliner receiving care from RN to don new sock.  OT washed out his old sock and hung to drive.  Pt completed recliner to bed to wc transfers with S with squat pivot.   At sink, completed oral care. Declined bathing and dressing this am as he said he still felt clean from yesterday.  Pt self propelled wc from room to orthogym with S taking several rest breaks. Worked on resistive (level 3 theraband) tricep pull down extensions and overhead lat pull downs 12 reps for 3 sets.   Pt then feeling fatigued. Transported pt back to his room. Pt reporting he was feeling nauseated.  Supervision sq pivot wc to bed to recliner.   Pt resting in recliner with belt on and all needs met.  Pt not feeling well, sitting with a bucket as he felt nauseated.    Pt declined calling nurse.    Therapy Documentation Precautions:  Precautions Precautions: Fall Precaution Comments: recent L BKA Restrictions Weight Bearing Restrictions: Yes LLE Weight Bearing: Non weight bearing    Vital Signs: Therapy Vitals Temp: 98.2 F (36.8 C) Pulse Rate: 75 Resp:  18 BP: 130/79 Patient Position (if appropriate): Lying Oxygen Therapy SpO2: 98 % O2 Device: Room Air Pain: Pain Assessment Pain Scale: 0-10 Pain Score: 0-No pain   Therapy/Group: Individual Therapy  [place] 05/21/2020, 9:30 AM

## 2020-05-21 NOTE — Progress Notes (Signed)
Physical Therapy Note  Patient Details  Name: Jonathon Bailey MRN: [id number] Date of Birth: Dec 26, 1955 Today's Date: 05/21/2020    Pt missed 75 min skilled PT due to nausea. Pt sitting up in recliner however states after OT session increased nausea(pt noted to have basin sitting next to pt. Pt refused any intervention at this time, will continue efforts.    Nyair Depaulo 05/21/2020, 3:10 PM

## 2020-05-21 NOTE — Progress Notes (Signed)
[facility] PHYSICAL MEDICINE & REHABILITATION PROGRESS NOTE   Subjective/Complaints: No complaints this morning,but complained of nausea and stomachache following PT.  Wanted to skip dialysis but nephrology counseled regarding importance Hgb down to 7.6  ROS: Denies CP, SOB, N/V/D  Objective:   No results found. Recent Labs    05/20/20 0610 05/21/20 0512  WBC 8.3 7.8  HGB 8.2* 7.6*  HCT 28.8* 26.6*  PLT 293 276   Recent Labs    05/19/20 1322 05/21/20 1344  NA 131* 133*  K 3.6 3.9  CL 94* 96*  CO2 24 24  GLUCOSE 100* 143*  BUN 32* 31*  CREATININE 8.59* 8.54*  CALCIUM 7.9* 8.5*    Intake/Output Summary (Last 24 hours) at 05/21/2020 1452 Last data filed at 05/21/2020 0900 Gross per 24 hour  Intake 240 ml  Output --  Net 240 ml     Physical Exam: Vital Signs Blood pressure (!) 179/91, pulse 92, temperature 98 F (36.7 C), temperature source Oral, resp. rate 18, height 6' (1.829 m), weight 125.2 kg, SpO2 100 %. General: Alert and oriented x 3, No apparent distress HEENT: Head is normocephalic, atraumatic, PERRLA, EOMI, sclera anicteric, oral mucosa pink and moist, dentition intact, ext ear canals clear,  Neck: Supple without JVD or lymphadenopathy Heart: Reg rate and rhythm. No murmurs rubs or gallops Chest: CTA bilaterally without wheezes, rales, or rhonchi; no distress Abdomen: Soft, non-tender, non-distended, bowel sounds positive. Extremities: No clubbing, cyanosis, or edema. Pulses are 2+ Skin: Left BKA with dressing CDI Psych: Normal mood.  Normal behavior. Musc: Left BKA with improving edema and tenderness.  Neuro: Alert Motor: Bilateral upper extremities: 5/5 proximal distal Left lower extremity: 4+/5 proximal distally, unchanged Right lower extremity: Hip flexion 4-4+/5 (pain inhibition)    Assessment/Plan: 1. Functional deficits secondary to left BKA which require 3+ hours per day of interdisciplinary therapy in a comprehensive inpatient rehab  setting.  Physiatrist is providing close team supervision and 24 hour management of active medical problems listed below.  Physiatrist and rehab team continue to assess barriers to discharge/monitor patient progress toward functional and medical goals  Care Tool:  Bathing  Bathing activity did not occur: Refused Body parts bathed by patient: Left arm, Right arm, Chest, Abdomen, Front perineal area, Buttocks, Right upper leg, Left upper leg, Face   Body parts bathed by helper: Right lower leg Body parts n/a: Left lower leg   Bathing assist Assist Level: Minimal Assistance - Patient > 75%     Upper Body Dressing/Undressing Upper body dressing   What is the patient wearing?: Pull over shirt    Upper body assist Assist Level: Set up assist    Lower Body Dressing/Undressing Lower body dressing      What is the patient wearing?: Underwear/pull up, Pants     Lower body assist Assist for lower body dressing: Contact Guard/Touching assist     Toileting Toileting Toileting Activity did not occur (Clothing management and hygiene only): N/A (no void or bm)  Toileting assist Assist for toileting: Minimal Assistance - Patient > 75%     Transfers Chair/bed transfer  Transfers assist     Chair/bed transfer assist level: Supervision/Verbal cueing     Locomotion Ambulation   Ambulation assist   Ambulation activity did not occur: Safety/medical concerns  Assist level: 2 helpers (min A of 1 and +2 w/c follow) Assistive device: Parallel bars Max distance: 73ft   Walk 10 feet activity   Assist  Walk 10 feet activity did  not occur: Safety/medical concerns        Walk 50 feet activity   Assist Walk 50 feet with 2 turns activity did not occur: Safety/medical concerns         Walk 150 feet activity   Assist Walk 150 feet activity did not occur: Safety/medical concerns         Walk 10 feet on uneven surface  activity   Assist Walk 10 feet on uneven  surfaces activity did not occur: Safety/medical concerns         Wheelchair     Assist Will patient use wheelchair at discharge?: Yes Type of Wheelchair: Manual    Wheelchair assist level: Supervision/Verbal cueing, Set up assist Max wheelchair distance: 60    Wheelchair 50 feet with 2 turns activity    Assist        Assist Level: Supervision/Verbal cueing, Set up assist   Wheelchair 150 feet activity     Assist      Assist Level: Moderate Assistance - Patient 50 - 74%   Blood pressure (!) 179/91, pulse 92, temperature 98 F (36.7 C), temperature source Oral, resp. rate 18, height 6' (1.829 m), weight 125.2 kg, SpO2 100 %.    Medical Problem List and Plan: 1.  Limitations with mobility, transfers, endurance, self-care secondary to left BKA.  Continue CIR  Continue stump shrinker  2.  Antithrombotics: -DVT/anticoagulation:  Pharmaceutical: Heparin             -antiplatelet therapy: ASA daily  3. Pain Management: Oxycodone as needed   Controlled 8/28. Last used oxycodone 8/27 twice. Wean to 5mg  q6H prn.   Monitor with increased exertion. 4. Mood: Team support             -antipsychotic agents: N/a. Positive mood, joking 5. Neuropsych: This patient is capable of making decisions on his own behalf.  Discussed with Neuropsych, appreciate recs 6. Skin/Wound Care: Daily dressing changes. Monitor wound for healing.  Vitamin C for healing.  Prosource added to promote wound healing.  7. Fluids/Electrolytes/Nutrition: Monitor I/O.  Modified/renal diet with 1200 ccFR/day. Labs monitored with HD.  Patient still upset about diet as heart healthy, CM, renal diet recommended.  8.  T2DM with hyperglycemia:  Hgb A1c- 7.9.  Continue Lantus 20 units daily (On levemir 75 mg bid/Novolog 5 untis bid PTA- reports eats better at home). Continue to monitor BS ac/hs and use SSI for elevated blood sugars. CBG (last 3)  Recent Labs    05/20/20 2119 05/21/20 0557 05/21/20 1121   GLUCAP 83 95 81   Well controlled 9.  ESRD: Schedule hemodialysis at the end of the day on TTS to help with tolerance of activity.  Metabolic bone disease managed with Hectorol and Renvela. 10.  COPD: Respiratory status stable on Incruse daily. With congestion- CXR shows atelectasis. Encourage use of IS. Breathing comfortably.  11.  Anemia of chronic disease: Serial CBCs with hemodialysis.  On IV Aranesp weekly with Wynonia Lawman twice daily.  Hemoglobin 8.2 on 8/27, labs with HD  Continue to monitor 12.  History of CAD: Carvedilol decreased due to hypotension --continue Coreg 3.125 mg bid with Lipitor and ASA. 13.  Hypertension  See #12   Vitals:   05/21/20 1330 05/21/20 1400  BP: (!) 145/74 (!) 179/91  Pulse: 63 92  Resp:    Temp:    SpO2:     Elevated- dialysis today 14.  Leukocytosis: Resolved  Afebrile  Continue to monitor 15.  Drug-induced constipation  Abdominal x-ray unremarkable  Bowel meds increased on 8/18  Some improvement  Patient noncompliant with bowel meds 16. GERD PPI 40mg  qd  LOS: 15 days A FACE TO FACE EVALUATION WAS PERFORMED  Jonathon Bailey Jonathon Bailey 05/21/2020, 2:52 PM

## 2020-05-22 ENCOUNTER — Inpatient Hospital Stay (HOSPITAL_COMMUNITY): Payer: No Typology Code available for payment source

## 2020-05-22 ENCOUNTER — Encounter (HOSPITAL_COMMUNITY): Payer: Self-pay | Admitting: Physical Medicine & Rehabilitation

## 2020-05-22 ENCOUNTER — Inpatient Hospital Stay (HOSPITAL_COMMUNITY): Payer: No Typology Code available for payment source | Admitting: Occupational Therapy

## 2020-05-22 LAB — GLUCOSE, CAPILLARY
Glucose-Capillary: 84 mg/dL (ref 70–99)
Glucose-Capillary: 88 mg/dL (ref 70–99)
Glucose-Capillary: 108 mg/dL — ABNORMAL HIGH (ref 70–99)
Glucose-Capillary: 101 mg/dL — ABNORMAL HIGH (ref 70–99)

## 2020-05-22 LAB — CBC
HCT: 28.8 % — ABNORMAL LOW (ref 39.0–52.0)
Hemoglobin: 8.2 g/dL — ABNORMAL LOW (ref 13.0–17.0)
MCH: 25 pg — ABNORMAL LOW (ref 26.0–34.0)
MCHC: 28.5 g/dL — ABNORMAL LOW (ref 30.0–36.0)
MCV: 87.8 fL (ref 80.0–100.0)
Platelets: 292 10*3/uL (ref 150–400)
RBC: 3.28 MIL/uL — ABNORMAL LOW (ref 4.22–5.81)
RDW: 19.5 % — ABNORMAL HIGH (ref 11.5–15.5)
WBC: 8.5 10*3/uL (ref 4.0–10.5)
nRBC: 0 % (ref 0.0–0.2)

## 2020-05-22 MED ORDER — CHLORHEXIDINE GLUCONATE CLOTH 2 % EX PADS
6.0000 | MEDICATED_PAD | Freq: Two times a day (BID) | CUTANEOUS | Status: DC
  Administered 2020-05-23 – 2020-05-27 (×9): 6 via TOPICAL

## 2020-05-22 NOTE — Progress Notes (Signed)
[facility] Progress Note   Subjective:   Patient seen and examined at bedside.  No complaints this AM. Denies SOB, CP, n/v/d, weakness, dizziness and fatigue.  Tolerated dialysis well.    Objective Vitals:   05/21/20 1733 05/21/20 1806 05/21/20 1933 05/22/20 0413  BP: (!) 159/84 (!) 156/90 (!) 143/72 (!) 154/90  Pulse: 82 79 86 85  Resp: 18 15 16 16   Temp: 98.5 F (36.9 C) 98 F (36.7 C) 98.7 F (37.1 C) 98.8 F (37.1 C)  TempSrc: Oral     SpO2: 97% 100% 98% 100%  Weight: 122.9 kg     Height:       Physical Exam General:obese male in NAD Heart:RRR Lungs:CTAB, nml WOB Abdomen:soft, obese, +BS, NTND Extremities:L BKA - no stump edema, No RLE edema Dialysis Access: L [facility]   Filed Weights   05/19/20 1713 05/21/20 1321 05/21/20 1733  Weight: 122.3 kg 125.2 kg 122.9 kg    Intake/Output Summary (Last 24 hours) at 05/22/2020 0831 Last data filed at 05/22/2020 0700 Gross per 24 hour  Intake 598 ml  Output 2000 ml  Net -1402 ml    Additional Objective Labs: Basic Metabolic Panel: Recent Labs  Lab 05/17/20 1420 05/19/20 1322 05/21/20 1344  NA 129* 131* 133*  K 4.0 3.6 3.9  CL 91* 94* 96*  CO2 24 24 24   GLUCOSE 131* 100* 143*  BUN 44* 32* 31*  CREATININE 11.05* 8.59* 8.54*  CALCIUM 8.2* 7.9* 8.5*  PHOS 5.1* 3.9 4.3   Liver Function Tests: Recent Labs  Lab 05/17/20 1420 05/19/20 1322 05/21/20 1344  ALBUMIN 2.3* 2.3* 2.3*   CBC: Recent Labs  Lab 05/17/20 1421 05/17/20 1421 05/19/20 1322 05/19/20 1322 05/20/20 0610 05/21/20 0512 05/22/20 0506  WBC 10.5   < > 9.7   < > 8.3 7.8 8.5  HGB 6.2*   < > 7.4*   < > 8.2* 7.6* 8.2*  HCT 22.2*   < > 25.9*   < > 28.8* 26.6* 28.8*  MCV 91.7  --  87.5  --  88.6 87.5 87.8  PLT 291   < > 259   < > 293 276 292   < > = values in this interval not displayed.   CBG: Recent Labs  Lab 05/21/20 0557 05/21/20 1121 05/21/20 1814 05/21/20 2103 05/22/20 0600  GLUCAP 95 81 85 129* 84     Medications:  . (feeding supplement) PROSource Plus  30 mL Oral BID BM  . sodium chloride   Intravenous Once  . vitamin C  1,000 mg Oral BID  . aspirin  81 mg Oral QODAY  . atorvastatin  40 mg Oral QHS  . carvedilol  3.125 mg Oral BID WC  . cetaphil  1 application Topical Daily  . Chlorhexidine Gluconate Cloth  6 each Topical Q0600  . Chlorhexidine Gluconate Cloth  6 each Topical Q0600  . cinacalcet  90 mg Oral Q supper  . darbepoetin (ARANESP) injection - DIALYSIS  100 mcg Intravenous Q Thu-HD  . doxercalciferol  6 mcg Intravenous Q T,Th,Sa-HD  . ferrous gluconate  324 mg Oral BID WC  . heparin  5,000 Units Subcutaneous Q8H  . insulin aspart  0-15 Units Subcutaneous TID WC  . insulin glargine  20 Units Subcutaneous QHS  . melatonin  5 mg Oral QHS  . multivitamin  1 tablet Oral QHS  . pantoprazole  40 mg Oral Daily  . polyethylene glycol  17 g Oral BID  . sevelamer  carbonate  4.8 g Oral TID WC  . sorbitol  30 mL Oral Q breakfast  . umeclidinium bromide  1 puff Inhalation Daily    Dialysis Orders: TTS [place] 4.5h 400/800 126kg 2/2.25 bath Hep 12000bolusTDC -Mircera65mcg q 2wks - just reordered/not yet given,lastdose1/26/21 -Hectorol43mcg IV qHD  Assessment/Plan: 1. PAD w/L leg ischemia +gangrene - Xray w/o signs osteomyelitis, s/p Vanc/Zosyn course. VVS consulted, b/l LE arteriogram 8/6 with severe b/l PAD below the knees, L BKA on 8/9 by Dr. Donnetta Hutching.  Now in CIR.  2. ESRD - on HD TTS.  HD yesterday tolerated well.  Last K3.6, use increase K bath. Next HD on 8/31. 3. Anemia of CKD- Hgb^8.2 this AM.  S/p 2 units pRBC 8/25.  Continue aranesp 162mcg qwk.  tsat 6%, ferritin 218 - No IV iron d/t allergy.   On PO iron BID.  4. Secondary hyperparathyroidism - CCa and phos at goal.  Continue VDRA, binders and sensipar.  5. HTN/volume - BP elevated this AM.  Hx chronically low BP.  Coreg reduced, may need to readjust if remains elevated.  UF 2L removed yesterday.  Does  not appear grossly volume overloaded.  UF as tolerated.  EDW will be lower on d/c.  Last post weight ~ 122.9kg 6. Nutrition - Renal diet w/fluid restrictions.  7. DMT2 8. CAD s/p CABG 9. Hx CVA 10. COPD  Jen Mow, PA-C [facility] 05/22/2020,8:31 AM  LOS: 16 days

## 2020-05-22 NOTE — Progress Notes (Signed)
Physical Therapy Session Note  Patient Details  Name: Jonathon Bailey MRN: [id number] Date of Birth: 1956-02-09  Today's Date: 05/22/2020 PT Individual Time: 5379-4327 PT Individual Time Calculation (min): 80 min   Short Term Goals: Week 2:  PT Short Term Goal 1 (Week 2): Pt will transfer bed<>chair with LRAD CGA PT Short Term Goal 1 - Progress (Week 2): Met PT Short Term Goal 2 (Week 2): Pt will transfer sit<>stand with LRAD CGA PT Short Term Goal 2 - Progress (Week 2): Progressing toward goal PT Short Term Goal 3 (Week 2): Pt will initiate gait training PT Short Term Goal 3 - Progress (Week 2): Met Week 3:  PT Short Term Goal 1 (Week 3): Pt will transfer stand<>pivot with LRAD CGA PT Short Term Goal 2 (Week 3): Pt will transfer sit<>stand with LRAD CGA consistantly PT Short Term Goal 3 (Week 3): Pt will ambulate 58f with LRAD min A of 1  Skilled Therapeutic Interventions/Progress Updates:  Pt seated in w/c.  He denied pain.  Pt donned w/c gloves and propelled 150' with supervision.  PT cued him for efficiency, iwht fair carry over. PT educated pt on managing R footrest, brakes and armrest removal for transfer.   Level lateral scoot transfer L><R to mat/wc with supervision.  Pt declined attempting transfer with RW.  Therapeutic exercises performed with LEs to increase strength for functional mobility: seated in w/c- use of Kinetron with RLE against reistance of PT, x 25 cycles x 2.  Pt reported that upright position caused nausea, so trunk flexion was avoided. Seated EOM- 15 x 1 each: L short arc quad knee extension, R long arc quad knee extension with 5# wwt at ankle, bil adductor squeezes, R ankle PF for heel raise, R ankle DF; 10 x 1  R hip flexion with 5# wt. Seated EOM with R foot on floor, use of press-up blocks in sitting, x 5; pt unable to clear hips.     PT adjusted R foot rest for 90/90/90 degree angles at hip/knee/ankle.  Dynamic sitting balance and UE strengthening,  tossing beach ball overhead x 2 min, and bounce /passing playground ball x 20 cycles.  Discussed phantom pain/sensation and PT educated pt on rubbing /tapping distal end of residual limb; pt return -demonstrated this.  Sit<> stand with RW, close supervision, x 3. Gait with RW on level tile x 2' with CGA; pt unable to clear foot, instead sliding it forward in bedroom shoe.   At end of session, pt seated in w/c , using call bell to ask NT to assist him to recliner.  He stated that he would not get up until she was present to assist.  Therapy Documentation Precautions:  Precautions Precautions: Fall Precaution Comments: recent L BKA Restrictions Weight Bearing Restrictions: Yes LLE Weight Bearing: Non weight bearing       Therapy/Group: Individual Therapy  Jaelynne Hockley 05/22/2020, 4:47 PM

## 2020-05-22 NOTE — Progress Notes (Signed)
[place] PHYSICAL MEDICINE & REHABILITATION PROGRESS NOTE   Subjective/Complaints: BP a little high No complaints Discussed his service in the [employer] No constipation  ROS: Denies CP, SOB, N/V/D  Objective:   No results found. Recent Labs    05/21/20 0512 05/22/20 0506  WBC 7.8 8.5  HGB 7.6* 8.2*  HCT 26.6* 28.8*  PLT 276 292   Recent Labs    05/19/20 1322 05/21/20 1344  NA 131* 133*  K 3.6 3.9  CL 94* 96*  CO2 24 24  GLUCOSE 100* 143*  BUN 32* 31*  CREATININE 8.59* 8.54*  CALCIUM 7.9* 8.5*    Intake/Output Summary (Last 24 hours) at 05/22/2020 1128 Last data filed at 05/22/2020 0700 Gross per 24 hour  Intake 358 ml  Output 2000 ml  Net -1642 ml     Physical Exam: Vital Signs Blood pressure (!) 154/90, pulse 85, temperature 98.8 F (37.1 C), resp. rate 16, height 6' (1.829 m), weight 122.9 kg, SpO2 100 %. General: Alert and oriented x 3, No apparent distress HEENT: Head is normocephalic, atraumatic, PERRLA, EOMI, sclera anicteric, oral mucosa pink and moist, dentition intact, ext ear canals clear,  Neck: Supple without JVD or lymphadenopathy Heart: Reg rate and rhythm. No murmurs rubs or gallops Chest: CTA bilaterally without wheezes, rales, or rhonchi; no distress Abdomen: Soft, non-tender, non-distended, bowel sounds positive. Extremities: No clubbing, cyanosis, or edema. Pulses are 2+ Skin: Left BKA with dressing CDI Psych: Normal mood.  Normal behavior. Musc: Left BKA with improving edema and tenderness.  Neuro: Alert Motor: Bilateral upper extremities: 5/5 proximal distal Left lower extremity: 4+/5 proximal distally, unchanged Right lower extremity: Hip flexion 4-4+/5 (pain inhibition)   Assessment/Plan: 1. Functional deficits secondary to left BKA which require 3+ hours per day of interdisciplinary therapy in a comprehensive inpatient rehab setting.  Physiatrist is providing close team supervision and 24 hour management of active medical  problems listed below.  Physiatrist and rehab team continue to assess barriers to discharge/monitor patient progress toward functional and medical goals  Care Tool:  Bathing  Bathing activity did not occur: Refused Body parts bathed by patient: Left arm, Right arm, Chest, Abdomen, Front perineal area, Buttocks, Right upper leg, Left upper leg, Face   Body parts bathed by helper: Right lower leg Body parts n/a: Left lower leg   Bathing assist Assist Level: Minimal Assistance - Patient > 75%     Upper Body Dressing/Undressing Upper body dressing   What is the patient wearing?: Pull over shirt    Upper body assist Assist Level: Set up assist    Lower Body Dressing/Undressing Lower body dressing      What is the patient wearing?: Underwear/pull up, Pants     Lower body assist Assist for lower body dressing: Contact Guard/Touching assist     Toileting Toileting Toileting Activity did not occur (Clothing management and hygiene only): N/A (no void or bm)  Toileting assist Assist for toileting: Minimal Assistance - Patient > 75%     Transfers Chair/bed transfer  Transfers assist     Chair/bed transfer assist level: Supervision/Verbal cueing     Locomotion Ambulation   Ambulation assist   Ambulation activity did not occur: Safety/medical concerns  Assist level: 2 helpers (min A of 1 and +2 w/c follow) Assistive device: Parallel bars Max distance: 78ft   Walk 10 feet activity   Assist  Walk 10 feet activity did not occur: Safety/medical concerns        Walk 50 feet  activity   Assist Walk 50 feet with 2 turns activity did not occur: Safety/medical concerns         Walk 150 feet activity   Assist Walk 150 feet activity did not occur: Safety/medical concerns         Walk 10 feet on uneven surface  activity   Assist Walk 10 feet on uneven surfaces activity did not occur: Safety/medical concerns         Wheelchair     Assist Will  patient use wheelchair at discharge?: Yes Type of Wheelchair: Manual    Wheelchair assist level: Supervision/Verbal cueing, Set up assist Max wheelchair distance: 60    Wheelchair 50 feet with 2 turns activity    Assist        Assist Level: Supervision/Verbal cueing, Set up assist   Wheelchair 150 feet activity     Assist      Assist Level: Moderate Assistance - Patient 50 - 74%   Blood pressure (!) 154/90, pulse 85, temperature 98.8 F (37.1 C), resp. rate 16, height 6' (1.829 m), weight 122.9 kg, SpO2 100 %.    Medical Problem List and Plan: 1.  Limitations with mobility, transfers, endurance, self-care secondary to left BKA.  Continue CIR  Continue stump shrinker  2.  Antithrombotics: -DVT/anticoagulation:  Pharmaceutical: Heparin             -antiplatelet therapy: ASA daily  3. Pain Management: Oxycodone as needed   Controlled 8/28. Last used oxycodone 8/27 twice. Wean to 5mg  q6H prn. Patient prefers to maintain this dose  Monitor with increased exertion. 4. Mood: Team support             -antipsychotic agents: N/a. Positive mood, joking 5. Neuropsych: This patient is capable of making decisions on his own behalf.  Discussed with Neuropsych, appreciate recs 6. Skin/Wound Care: Daily dressing changes. Monitor wound for healing.  Vitamin C for healing.  Prosource added to promote wound healing.  7. Fluids/Electrolytes/Nutrition: Monitor I/O.  Modified/renal diet with 1200 ccFR/day. Labs monitored with HD.  Patient still upset about diet as heart healthy, CM, renal diet recommended.  8.  T2DM with hyperglycemia:  Hgb A1c- 7.9.  Continue Lantus 20 units daily (On levemir 75 mg bid/Novolog 5 untis bid PTA- reports eats better at home). Continue to monitor BS ac/hs and use SSI for elevated blood sugars. CBG (last 3)  Recent Labs    05/21/20 2103 05/22/20 0600 05/22/20 1105  GLUCAP 129* 84 39   Well controlled 9.  ESRD: Schedule hemodialysis at the end of the  day on TTS to help with tolerance of activity.  Metabolic bone disease managed with Hectorol and Renvela. 10.  COPD: Respiratory status stable on Incruse daily. With congestion- CXR shows atelectasis. Encourage use of IS. Breathing comfortably.  11.  Anemia of chronic disease: Serial CBCs with hemodialysis.  On IV Aranesp weekly with Wynonia Lawman twice daily.  Hemoglobin 8.2 on 8/27, Hgb 8.2 on 8/29  Continue to monitor 12.  History of CAD: Carvedilol decreased due to hypotension --continue Coreg 3.125 mg bid with Lipitor and ASA. 13.  Hypertension  See #12   Vitals:   05/21/20 1933 05/22/20 0413  BP: (!) 143/72 (!) 154/90  Pulse: 86 85  Resp: 16 16  Temp: 98.7 F (37.1 C) 98.8 F (37.1 C)  SpO2: 98% 100%   14.  Leukocytosis: Resolved  Afebrile  Continue to monitor 15.  Drug-induced constipation  Abdominal x-ray unremarkable  Bowel meds increased on  8/18  Some improvement  Patient noncompliant with bowel meds 16. GERD PPI 40mg  qd  LOS: 16 days A FACE TO FACE EVALUATION WAS PERFORMED  Jonathon Bailey 05/22/2020, 11:28 AM

## 2020-05-22 NOTE — Progress Notes (Signed)
Occupational Therapy Session Note  Patient Details  Name: Jonathon Bailey MRN: [id number] Date of Birth: 1955-11-24  Today's Date: 05/22/2020 OT Individual Time: 1002-1100 OT Individual Time Calculation (min): 58 min    Short Term Goals: Week 3:  OT Short Term Goal 1 (Week 3): STG = LTGs due to remaining ELOS  Skilled Therapeutic Interventions/Progress Updates:    patient requests to re-schedule due to church service online - 2nd attempt to complete session at 1000.  He is seated in recliner and agrees to participate.   Sit pivot transfer recliner to bed with CS and bracing of recliner to ensure it does not slide.  He completed bathing and dressing seated edge of bed.  UB bathing and dressing with set up, LB bathing set up, dressing min A for right pant leg.  Sit to stand with elevated bed surface and RW CGA - min A to pull pants over hips.  Sit pivot bed to w/c with CS.  He completed grooming tasks w/c level mod I.   He remained seated in w/c at close of session with call bell and tray table in reach.    Therapy Documentation Precautions:  Precautions Precautions: Fall Precaution Comments: recent L BKA Restrictions Weight Bearing Restrictions: Yes LLE Weight Bearing: Non weight bearing   Therapy/Group: Individual Therapy  Carlos Levering 05/22/2020, 7:29 AM

## 2020-05-23 ENCOUNTER — Inpatient Hospital Stay (HOSPITAL_COMMUNITY): Payer: No Typology Code available for payment source | Admitting: Physical Therapy

## 2020-05-23 ENCOUNTER — Inpatient Hospital Stay (HOSPITAL_COMMUNITY): Payer: No Typology Code available for payment source | Admitting: Occupational Therapy

## 2020-05-23 ENCOUNTER — Inpatient Hospital Stay (HOSPITAL_COMMUNITY): Payer: No Typology Code available for payment source

## 2020-05-23 DIAGNOSIS — R140 Abdominal distension (gaseous): Secondary | ICD-10-CM

## 2020-05-23 LAB — GLUCOSE, CAPILLARY
Glucose-Capillary: 87 mg/dL (ref 70–99)
Glucose-Capillary: 121 mg/dL — ABNORMAL HIGH (ref 70–99)
Glucose-Capillary: 144 mg/dL — ABNORMAL HIGH (ref 70–99)
Glucose-Capillary: 108 mg/dL — ABNORMAL HIGH (ref 70–99)

## 2020-05-23 MED ORDER — CARVEDILOL 6.25 MG PO TABS
6.2500 mg | ORAL_TABLET | Freq: Two times a day (BID) | ORAL | Status: DC
  Administered 2020-05-23 – 2020-05-27 (×6): 6.25 mg via ORAL
  Filled 2020-05-23 (×7): qty 1

## 2020-05-23 NOTE — Progress Notes (Signed)
Occupational Therapy Session Note  Patient Details  Name: Jonathon Bailey MRN: [id number] Date of Birth: Apr 02, 1956  Today's Date: 05/23/2020 OT Individual Time: 1660-6301 OT Individual Time Calculation (min): 46 min    Short Term Goals: Week 3:  OT Short Term Goal 1 (Week 3): STG = LTGs due to remaining ELOS  Skilled Therapeutic Interventions/Progress Updates:    Treatment session with focus on functional mobility, endurance, and education on care for residual limb.  Pt received upright in w/c in handoff from PT.  Pt reporting decreased motivation to engage in any functional tasks this session.  Pt reports tired of being in the hospital.  Therapist recommended pt engage in therapy session outside for increased mood.  Pt propelled w/c 150' with increased time due to decreased endurance.  Therapist educated pt on w/c mobility in community environment, on/off elevators, and over uneven surfaces.  Pt propelled w/c through hospital doorways with increased time due to threshold.  Engaged in massage and tapping to residual limb to tolerance while therapist providing education on care for residual limb and alternative methods for pain management, as pt prefers to not take pain meds.  Pt affect improved while being outside.  Returned to room with pt completing 100' w/c mobility and therapist completing remainder due to fatigue and time constraints.  Pt remained upright in w/c with all needs in reach.  Therapy Documentation Precautions:  Precautions Precautions: Fall Precaution Comments: recent L BKA Restrictions Weight Bearing Restrictions: Yes LLE Weight Bearing: Non weight bearing Pain:  Pt reports "It's okay.  I plan to work through it"   Therapy/Group: Individual Therapy  [name] 05/23/2020, 9:41 AM

## 2020-05-23 NOTE — Progress Notes (Signed)
Patient ID: Jonathon Bailey, male   DOB: 10-01-1955, 64 y.o.   MRN: [id number]  Have taken consent form to Medical records dept so [facility] can [facility] request.

## 2020-05-23 NOTE — Progress Notes (Signed)
[place] PHYSICAL MEDICINE & REHABILITATION PROGRESS NOTE   Subjective/Complaints: Up in chair. Anxious to get home. Pain reasonably controlled.   ROS: Patient denies fever, rash, sore throat, blurred vision, nausea, vomiting, diarrhea, cough, shortness of breath or chest pain,  headache, or mood change.    Objective:   No results found. Recent Labs    05/21/20 0512 05/22/20 0506  WBC 7.8 8.5  HGB 7.6* 8.2*  HCT 26.6* 28.8*  PLT 276 292   Recent Labs    05/21/20 1344  NA 133*  K 3.9  CL 96*  CO2 24  GLUCOSE 143*  BUN 31*  CREATININE 8.54*  CALCIUM 8.5*    Intake/Output Summary (Last 24 hours) at 05/23/2020 1237 Last data filed at 05/23/2020 0747 Gross per 24 hour  Intake 417 ml  Output --  Net 417 ml     Physical Exam: Vital Signs Blood pressure (!) 143/101, pulse 80, temperature 98.3 F (36.8 C), resp. rate 17, height 6' (1.829 m), weight 122.9 kg, SpO2 100 %. Constitutional: No distress . Vital signs reviewed. HEENT: EOMI, oral membranes moist Neck: supple Cardiovascular: RRR without murmur. No JVD    Respiratory/Chest: CTA Bilaterally without wheezes or rales. Normal effort    GI/Abdomen: BS +, non-tender, non-distended Ext: no clubbing, cyanosis Psych: pleasant and cooperative Skin: Left BKA with dressing CDI Psych: Normal mood.  Normal behavior. Musc: Left BKA with improving edema and tenderness. Wearing shrinker Neuro: Alert Motor: Bilateral upper extremities: 5/5 proximal distal Left lower extremity: 4+/5 proximal distally, unchanged Right lower extremity: Hip flexion 4-4+/5 (pain inhibition)   Assessment/Plan: 1. Functional deficits secondary to left BKA which require 3+ hours per day of interdisciplinary therapy in a comprehensive inpatient rehab setting.  Physiatrist is providing close team supervision and 24 hour management of active medical problems listed below.  Physiatrist and rehab team continue to assess barriers to  discharge/monitor patient progress toward functional and medical goals  Care Tool:  Bathing  Bathing activity did not occur: Refused Body parts bathed by patient: Left arm, Right arm, Chest, Abdomen, Front perineal area, Buttocks, Right upper leg, Left upper leg, Face   Body parts bathed by helper: Right lower leg Body parts n/a: Left lower leg   Bathing assist Assist Level: Minimal Assistance - Patient > 75%     Upper Body Dressing/Undressing Upper body dressing   What is the patient wearing?: Pull over shirt    Upper body assist Assist Level: Set up assist    Lower Body Dressing/Undressing Lower body dressing      What is the patient wearing?: Underwear/pull up, Pants     Lower body assist Assist for lower body dressing: Contact Guard/Touching assist     Toileting Toileting Toileting Activity did not occur (Clothing management and hygiene only): N/A (no void or bm)  Toileting assist Assist for toileting: Minimal Assistance - Patient > 75%     Transfers Chair/bed transfer  Transfers assist     Chair/bed transfer assist level: Supervision/Verbal cueing     Locomotion Ambulation   Ambulation assist   Ambulation activity did not occur: Safety/medical concerns  Assist level: 2 helpers (min A of 1 and +2 w/c follow) Assistive device: Parallel bars Max distance: 61ft   Walk 10 feet activity   Assist  Walk 10 feet activity did not occur: Safety/medical concerns        Walk 50 feet activity   Assist Walk 50 feet with 2 turns activity did not occur: Safety/medical concerns  Walk 150 feet activity   Assist Walk 150 feet activity did not occur: Safety/medical concerns         Walk 10 feet on uneven surface  activity   Assist Walk 10 feet on uneven surfaces activity did not occur: Safety/medical concerns         Wheelchair     Assist Will patient use wheelchair at discharge?: Yes Type of Wheelchair: Manual    Wheelchair  assist level: Supervision/Verbal cueing, Set up assist Max wheelchair distance: 80    Wheelchair 50 feet with 2 turns activity    Assist        Assist Level: Supervision/Verbal cueing, Set up assist   Wheelchair 150 feet activity     Assist      Assist Level: Moderate Assistance - Patient 50 - 74%   Blood pressure (!) 143/101, pulse 80, temperature 98.3 F (36.8 C), resp. rate 17, height 6' (1.829 m), weight 122.9 kg, SpO2 100 %.    Medical Problem List and Plan: 1.  Limitations with mobility, transfers, endurance, self-care secondary to left BKA.  Continue CIR  Continue stump shrinker  2.  Antithrombotics: -DVT/anticoagulation:  Pharmaceutical: Heparin             -antiplatelet therapy: ASA daily  3. Pain Management: Oxycodone as needed   Controlled 8/30  . 4. Mood: Team support             -antipsychotic agents: N/a. Positive mood, joking 5. Neuropsych: This patient is capable of making decisions on his own behalf.  Discussed with Neuropsych, appreciate recs 6. Skin/Wound Care: Daily dressing changes. Monitor wound for healing.  Vitamin C for healing.  Prosource added to promote wound healing.  7. Fluids/Electrolytes/Nutrition: Monitor I/O.  Modified/renal diet with 1200 ccFR/day. Labs monitored with HD.  Patient still upset about diet as heart healthy, CM, renal diet recommended.  8.  T2DM with hyperglycemia:  Hgb A1c- 7.9.  Continue Lantus 20 units daily (On levemir 75 mg bid/Novolog 5 untis bid PTA- reports eats better at home). Continue to monitor BS ac/hs and use SSI for elevated blood sugars. CBG (last 3)  Recent Labs    05/22/20 2027 05/23/20 0606 05/23/20 1138  GLUCAP 101* 87 121*   Well controlled 8/30 9.  ESRD: Schedule hemodialysis at the end of the day on TTS to help with tolerance of activity.  Metabolic bone disease managed with Hectorol and Renvela. 10.  COPD: Respiratory status stable on Incruse daily. With congestion- CXR shows atelectasis.  Encourage use of IS. Breathing comfortably.  11.  Anemia of chronic disease: Serial CBCs with hemodialysis.  On IV Aranesp weekly with Wynonia Lawman twice daily.  Hemoglobin 8.2 on 8/27, Hgb 8.2 on 8/29  Continue to monitor 12.  History of CAD: Carvedilol decreased due to hypotension --continue Coreg 3.125 mg bid with Lipitor and ASA. 13.  Hypertension  BP still elevated  8/30 increase coreg to 6.25mg  bid   Vitals:   05/22/20 2015 05/23/20 0503  BP: 135/86 (!) 143/101  Pulse: 81 80  Resp: 18 17  Temp: 98.4 F (36.9 C) 98.3 F (36.8 C)  SpO2: 97% 100%   14.  Leukocytosis: Resolved  Afebrile  Continue to monitor 15.  Drug-induced constipation  Abdominal x-ray unremarkable  Bowel meds increased on 8/18  Some improvement  Patient noncompliant with bowel meds 16. GERD PPI 40mg  qd  LOS: 17 days A FACE TO FACE EVALUATION WAS PERFORMED  Meredith Staggers 05/23/2020, 12:37 PM

## 2020-05-23 NOTE — Plan of Care (Signed)
  Problem: RH Tub/Shower Transfers Goal: LTG Patient will perform tub/shower transfers w/assist (OT) Description: LTG: Patient will perform tub/shower transfers with assist, with/without cues using equipment (OT) Outcome: Not Applicable Flowsheets (Taken 05/23/2020 0722) LTG: Pt will perform tub/shower stall transfers with assistance level of: (D/C as not applicable at this time) -- Note: D/c as not applicable at this time

## 2020-05-23 NOTE — Progress Notes (Signed)
Physical Therapy Session Note  Patient Details  Name: Jonathon Bailey MRN: [id number] Date of Birth: Sep 26, 1955  Today's Date: 05/23/2020 PT Individual Time: 0806-0830 PT Individual Time Calculation (min): 24 min   Short Term Goals: Week 2:  PT Short Term Goal 1 (Week 2): Pt will transfer bed<>chair with LRAD CGA PT Short Term Goal 1 - Progress (Week 2): Met PT Short Term Goal 2 (Week 2): Pt will transfer sit<>stand with LRAD CGA PT Short Term Goal 2 - Progress (Week 2): Progressing toward goal PT Short Term Goal 3 (Week 2): Pt will initiate gait training PT Short Term Goal 3 - Progress (Week 2): Met  Skilled Therapeutic Interventions/Progress Updates:  Pt presents siting in recliner receiving meds from nursing.  Pt agreeable to therapy.  Pt performed sit to stand from recliner w/ CGA and verbal cues for initiation.  Pt stepped x 5-6 steps to w/c w/ CGA and RW .  Pt able to negotiate w/c in room and hallways w/ supervision up to 80' before requiring rest break.  Pt negotiated w/c up to mat table w/ min A for brakes, right arm rest and leg rest removal.  Pt educated on placement of w/c to avoid wheel for transfers w/c<> mat table w/ lateral scoot and supervision.  Pt required min A to place all w/c equipment w/ verbal and visual cues.  Pt returned to room and handed off to OT for continued therapy.  Spoke w/ nursing, unsure if finished meds.     Therapy Documentation Precautions:  Precautions Precautions: Fall Precaution Comments: recent L BKA Restrictions Weight Bearing Restrictions: Yes LLE Weight Bearing: Non weight bearing General:   Vital Signs: Therapy Vitals Temp: 98.3 F (36.8 C) Pulse Rate: 80 Resp: 17 BP: (!) 143/101 Patient Position (if appropriate): Lying Oxygen Therapy SpO2: 100 % O2 Device: Room Air Pain: no c/o.         Therapy/Group: Individual Therapy  Ladoris Gene 05/23/2020, 8:32 AM

## 2020-05-23 NOTE — Progress Notes (Signed)
Physical Therapy Session Note  Patient Details  Name: Jonathon Bailey MRN: [id number] Date of Birth: Mar 28, 1956  Today's Date: 05/23/2020 PT Individual Time: 1000-1038 PT Individual Time Calculation (min): 38 min  and Today's Date: 05/23/2020 PT Missed Time: 22 Minutes Missed Time Reason: Patient unwilling to participate  Short Term Goals: Week 2:  PT Short Term Goal 1 (Week 2): Pt will transfer bed<>chair with LRAD CGA PT Short Term Goal 1 - Progress (Week 2): Met PT Short Term Goal 2 (Week 2): Pt will transfer sit<>stand with LRAD CGA PT Short Term Goal 2 - Progress (Week 2): Progressing toward goal PT Short Term Goal 3 (Week 2): Pt will initiate gait training PT Short Term Goal 3 - Progress (Week 2): Met Week 3:  PT Short Term Goal 1 (Week 3): Pt will transfer stand<>pivot with LRAD CGA PT Short Term Goal 2 (Week 3): Pt will transfer sit<>stand with LRAD CGA consistantly PT Short Term Goal 3 (Week 3): Pt will ambulate 5f with LRAD min A of 1  Skilled Therapeutic Interventions/Progress Updates:   Received pt sitting in WC, pt agreeable to therapy, and denied any pain during session. Session with emphasis on functional mobility/transfers, generalized strengthening, dynamic standing balance/coordination, simulated car transfers, and improved activity tolerance. Pt performed WC mobility 530fusing bilateral UEs and supervision and transported remainder of way [facility]. Pt performed simulated car transfer via lateral scoot with supervision with therapist stabilizing WC. Pt able to manage WC parts (legrests and brakes with supervision). [facility] and transferred sit<>stand with RW mod A. Pt required x3 attempts to stand due to fatigue from previous therapies. Worked on dynamic standing balance playing connect 4 x1 trial using R UE with min A for balance. Pt's cousin called and pt requested to end session early and return to room to  fill out paperwork with his cousin. [facility] Concluded session with pt sitting in WC, needs within reach, and seatbelt alarm on. 22 minutes missed of skilled physical therapy due to pt's unwillingness to participate to visit with family.   Therapy Documentation Precautions:  Precautions Precautions: Fall Precaution Comments: recent L BKA Restrictions Weight Bearing Restrictions: Yes LLE Weight Bearing: Non weight bearing  Therapy/Group: Individual Therapy AnAlfonse AlpersT, DPT   05/23/2020, 7:21 AM

## 2020-05-23 NOTE — Progress Notes (Signed)
Subjective: No complaints states he is tolerating dialysis and rehab session today  Objective Vital signs in last 24 hours: Vitals:   05/22/20 0413 05/22/20 1638 05/22/20 2015 05/23/20 0503  BP: (!) 154/90 (!) 156/90 135/86 (!) 143/101  Pulse: 85 84 81 80  Resp: 16 16 18 17   Temp: 98.8 F (37.1 C) 98.6 F (37 C) 98.4 F (36.9 C) 98.3 F (36.8 C)  TempSrc:      SpO2: 100% 99% 97% 100%  Weight:      Height:       Weight change:   Physical Exam: General: Obese male, NAD Heart: RRR, no MR G Lungs: CTA, nonlabored breathing Abdomen: Obese, soft, bowel sounds normoactive, ND, NT Extremities: Left BKA no stump edema right lower extremity trace pedal edema Dialysis Access: Left IJ PermCath dressing dry clear nontender  Dialysis Orders: TTS [facility] 4.5h 400/800 126kg 2/2.25 bath Hep 12000bolusTDC -Mircera74mcg q 2wks - just reordered/not yet given,lastdose1/26/21 -Hectorol30mcg IV qHD  Problem/Plan: 1.PAD w/L leg ischemia +gangrene - , s/p Vanc/Zosyn course. VVS consulted, b/l LE arteriogram 8/6 with severe b/l PAD below the knees, L BKA on 8/9 by Dr. Donnetta Hutching. Now in CIR.  2. ESRD- on HD TTS.Marland Kitchen Last K 3.9,<3.6 use 4K bath K bath. Next HD on 8/31. 3. Anemiaof CKD-Hgb^8.2=8/29. S/p 2 units pRBC 8/25. Continue aranesp 100 mcg qwk. tsat 6%, ferritin 218 - No IV iron d/t allergy. On PO iron BID.  4. Secondary hyperparathyroidism- CCa and phos at goal. Continue VDRA, binders and sensipar.  5.HTN/volume- BP  143/101  this a.m.hx chronically low BP. Coreg reduced, may need to readjust if remains elevated.  UF 2L removed/28 HD.  Does not appear grossly volume overloaded.  UF as tolerated.  EDW will be lower on d/c. Last post weight ~ 122.9kg 6. Nutrition- Renal diet w/fluid restrictions.   On protein supplement with ALB 2.3 7.DMT2 8. CAD s/p CABG 9. Hx CVA 10. COPD   Ernest Haber, PA-C [facility] [phone] 05/23/2020,12:34 PM   LOS: 17 days   Labs: Basic Metabolic Panel: Recent Labs  Lab 05/17/20 1420 05/19/20 1322 05/21/20 1344  NA 129* 131* 133*  K 4.0 3.6 3.9  CL 91* 94* 96*  CO2 24 24 24   GLUCOSE 131* 100* 143*  BUN 44* 32* 31*  CREATININE 11.05* 8.59* 8.54*  CALCIUM 8.2* 7.9* 8.5*  PHOS 5.1* 3.9 4.3   Liver Function Tests: Recent Labs  Lab 05/17/20 1420 05/19/20 1322 05/21/20 1344  ALBUMIN 2.3* 2.3* 2.3*   No results for input(s): LIPASE, AMYLASE in the last 168 hours. No results for input(s): AMMONIA in the last 168 hours. CBC: Recent Labs  Lab 05/17/20 1421 05/17/20 1421 05/19/20 1322 05/19/20 1322 05/20/20 0610 05/21/20 0512 05/22/20 0506  WBC 10.5   < > 9.7   < > 8.3 7.8 8.5  HGB 6.2*   < > 7.4*   < > 8.2* 7.6* 8.2*  HCT 22.2*   < > 25.9*   < > 28.8* 26.6* 28.8*  MCV 91.7  --  87.5  --  88.6 87.5 87.8  PLT 291   < > 259   < > 293 276 292   < > = values in this interval not displayed.   Cardiac Enzymes: No results for input(s): CKTOTAL, CKMB, CKMBINDEX, TROPONINI in the last 168 hours. CBG: Recent Labs  Lab 05/22/20 1105 05/22/20 1631 05/22/20 2027 05/23/20 0606 05/23/20 1138  GLUCAP 88 108* 101* 87 121*    Studies/Results:  No results found. Medications:  . (feeding supplement) PROSource Plus  30 mL Oral BID BM  . sodium chloride   Intravenous Once  . vitamin C  1,000 mg Oral BID  . aspirin  81 mg Oral QODAY  . atorvastatin  40 mg Oral QHS  . carvedilol  3.125 mg Oral BID WC  . cetaphil  1 application Topical Daily  . Chlorhexidine Gluconate Cloth  6 each Topical BID  . cinacalcet  90 mg Oral Q supper  . darbepoetin (ARANESP) injection - DIALYSIS  100 mcg Intravenous Q Thu-HD  . doxercalciferol  6 mcg Intravenous Q T,Th,Sa-HD  . ferrous gluconate  324 mg Oral BID WC  . heparin  5,000 Units Subcutaneous Q8H  . insulin aspart  0-15 Units Subcutaneous TID WC  . insulin glargine  20 Units Subcutaneous QHS  . melatonin  5 mg Oral QHS  . multivitamin  1 tablet  Oral QHS  . pantoprazole  40 mg Oral Daily  . polyethylene glycol  17 g Oral BID  . sevelamer carbonate  4.8 g Oral TID WC  . sorbitol  30 mL Oral Q breakfast

## 2020-05-24 ENCOUNTER — Inpatient Hospital Stay (HOSPITAL_COMMUNITY): Payer: No Typology Code available for payment source | Admitting: Occupational Therapy

## 2020-05-24 ENCOUNTER — Inpatient Hospital Stay (HOSPITAL_COMMUNITY): Payer: No Typology Code available for payment source

## 2020-05-24 LAB — GLUCOSE, CAPILLARY
Glucose-Capillary: 89 mg/dL (ref 70–99)
Glucose-Capillary: 96 mg/dL (ref 70–99)
Glucose-Capillary: 82 mg/dL (ref 70–99)
Glucose-Capillary: 152 mg/dL — ABNORMAL HIGH (ref 70–99)

## 2020-05-24 LAB — RENAL FUNCTION PANEL
Albumin: 2.5 g/dL — ABNORMAL LOW (ref 3.5–5.0)
Anion gap: 13 (ref 5–15)
BUN: 42 mg/dL — ABNORMAL HIGH (ref 8–23)
CO2: 24 mmol/L (ref 22–32)
Calcium: 8.4 mg/dL — ABNORMAL LOW (ref 8.9–10.3)
Chloride: 94 mmol/L — ABNORMAL LOW (ref 98–111)
Creatinine, Ser: 9.74 mg/dL — ABNORMAL HIGH (ref 0.61–1.24)
GFR calc Af Amer: 6 mL/min — ABNORMAL LOW (ref >60)
GFR calc non Af Amer: 5 mL/min — ABNORMAL LOW (ref >60)
Glucose, Bld: 111 mg/dL — ABNORMAL HIGH (ref 70–99)
Phosphorus: 4.7 mg/dL — ABNORMAL HIGH (ref 2.5–4.6)
Potassium: 4.4 mmol/L (ref 3.5–5.1)
Sodium: 131 mmol/L — ABNORMAL LOW (ref 135–145)

## 2020-05-24 LAB — CBC
HCT: 28.5 % — ABNORMAL LOW (ref 39.0–52.0)
Hemoglobin: 8.1 g/dL — ABNORMAL LOW (ref 13.0–17.0)
MCH: 25 pg — ABNORMAL LOW (ref 26.0–34.0)
MCHC: 28.4 g/dL — ABNORMAL LOW (ref 30.0–36.0)
MCV: 88 fL (ref 80.0–100.0)
Platelets: 326 10*3/uL (ref 150–400)
RBC: 3.24 MIL/uL — ABNORMAL LOW (ref 4.22–5.81)
RDW: 18.9 % — ABNORMAL HIGH (ref 11.5–15.5)
WBC: 8.3 10*3/uL (ref 4.0–10.5)
nRBC: 0 % (ref 0.0–0.2)

## 2020-05-24 MED ORDER — HEPARIN SODIUM (PORCINE) 1000 UNIT/ML IJ SOLN
INTRAMUSCULAR | Status: AC
  Administered 2020-05-24: 10000 [IU] via ARTERIOVENOUS_FISTULA
  Filled 2020-05-24: qty 10

## 2020-05-24 MED ORDER — HEPARIN SODIUM (PORCINE) 1000 UNIT/ML IJ SOLN
INTRAMUSCULAR | Status: AC
  Administered 2020-05-24: 1000 [IU]
  Filled 2020-05-24: qty 5

## 2020-05-24 MED ORDER — DOXERCALCIFEROL 4 MCG/2ML IV SOLN
INTRAVENOUS | Status: AC
  Administered 2020-05-24: 6 ug via INTRAVENOUS
  Filled 2020-05-24: qty 4

## 2020-05-24 NOTE — Progress Notes (Signed)
Physical Therapy Discharge Summary  Patient Details  Name: Jonathon Bailey MRN: [id number] Date of Birth: Mar 26, 1956  Patient has met 7 of 7 long term goals due to improved activity tolerance, improved balance, improved postural control, increased strength, decreased pain, improved awareness and improved coordination. Patient to discharge at a wheelchair level Supervision. Patient's son is unavailable to provide the necessary physical assistance at discharge. Therefore, pt discharging to SNF.   All goals met  Recommendation:  Patient will benefit from ongoing skilled PT services in skilled nursing facility setting to continue to advance safe functional mobility, address ongoing impairments in transfers, generalized strengthening, dynamic standing balance/coordination, ambulation, limb loss education, endurance, and to minimize fall risk.  Equipment: No equipment provided  Reasons for discharge: treatment goals met and discharge from hospital  Patient/family agrees with progress made and goals achieved: Yes  PT Discharge Precautions/Restrictions Precautions Precautions: Fall Precaution Comments: recent L BKA Restrictions Weight Bearing Restrictions: Yes LLE Weight Bearing: Non weight bearing Cognition Overall Cognitive Status: Within Functional Limits for tasks assessed Arousal/Alertness: Awake/alert Orientation Level: Oriented X4 Memory: Appears intact Awareness: Appears intact Problem Solving: Appears intact Safety/Judgment: Appears intact Sensation Sensation Light Touch: Impaired by gross assessment Proprioception: Impaired by gross assessment Additional Comments: decreased along incision on L residual limb, decreased along medial/lateral malleoli and great toe on R LE Coordination Gross Motor Movements are Fluid and Coordinated: No Fine Motor Movements are Fluid and Coordinated: Yes Coordination and Movement Description: grossly uncoordinated due to L BKA, decreased  balance/postural control, generalized weakness, and poor endurance. Finger Nose Finger Test: [facility] bilaterally Heel Shin Test: unable to perform on L LE due to BKA; decreased ROM on R LE due to body habitus Motor  Motor Motor: Abnormal postural alignment and control Motor - Skilled Clinical Observations: grossly uncoordinated due to L BKA, decresaed balance/postural control, generalized weakness, and poor endurance.  Mobility Bed Mobility Bed Mobility: Rolling Right;Rolling Left;Sit to Supine;Supine to Sit Rolling Right: Independent with assistive device Rolling Left: Independent with assistive device Supine to Sit: Independent with assistive device Sit to Supine: Independent with assistive device Transfers Transfers: Sit to Stand;Stand to Sit;Stand Pivot Transfers;Lateral/Scoot Transfers Sit to Stand: Supervision/Verbal cueing Stand to Sit: Supervision/Verbal cueing Stand Pivot Transfers: Contact Guard/Touching assist Lateral/Scoot Transfers: Supervision/Verbal cueing Transfer (Assistive device): Rolling walker Locomotion  Gait Ambulation: Yes Gait Assistance: 2 Helpers Gait Distance (Feet): 8 Feet Assistive device: Parallel bars Gait Assistance Details: Verbal cues for technique;Verbal cues for gait pattern Gait Assistance Details: verbal cues for "hopping" technique Gait Gait: Yes Gait Pattern: Impaired Gait Pattern: Decreased step length - right;Poor foot clearance - right;Decreased trunk rotation;Decreased stride length Gait velocity: decreased Wheelchair Mobility Wheelchair Mobility: Yes Wheelchair Assistance: Chartered loss adjuster: Both upper extremities Wheelchair Parts Management: Supervision/cueing Distance: 115f  Trunk/Postural Assessment  Cervical Assessment Cervical Assessment: Within Functional Limits Thoracic Assessment Thoracic Assessment: Within Functional Limits Lumbar Assessment Lumbar Assessment: Exceptions to [facility](posterior  pelvic tilt) Postural Control Postural Control: Deficits on evaluation  Balance Balance Balance Assessed: Yes Static Sitting Balance Static Sitting - Balance Support: Feet supported;Bilateral upper extremity supported Static Sitting - Level of Assistance: 7: Independent Dynamic Sitting Balance Dynamic Sitting - Balance Support: Feet supported;Bilateral upper extremity supported Dynamic Sitting - Level of Assistance: 7: Independent Static Standing Balance Static Standing - Balance Support: Bilateral upper extremity supported (RW) Static Standing - Level of Assistance: 5: Stand by assistance (supervision) Dynamic Standing Balance Dynamic Standing - Balance Support: Bilateral upper extremity supported (RW) Dynamic Standing -  Level of Assistance: 5: Stand by assistance (CGA) Extremity Assessment RLE Assessment RLE Assessment: Exceptions to [facility] General Strength Comments: grossly generalized to 4/5 LLE Assessment LLE Assessment: Exceptions to [facility] General Strength Comments: grossly generalized to 4/5 (hip flexion, knee flexion/extension, abduction/adduction)  Alfonse Alpers PT, DPT  05/24/2020, 7:41 AM

## 2020-05-24 NOTE — Progress Notes (Signed)
Occupational Therapy Session Note  Patient Details  Name: Jonathon Bailey MRN: [id number] Date of Birth: [birth year]  Today's Date: 05/24/2020 OT Individual Time: 0830-0930 OT Individual Time Calculation (min): 60 min    Short Term Goals: Week 3:  OT Short Term Goal 1 (Week 3): STG = LTGs due to remaining ELOS  Skilled Therapeutic Interventions/Progress Updates:    Treatment session with focus on self-care retraining with functional transfers, dynamic sitting balance, and sit > stand during bathing and dressing tasks.  Pt received upright in recliner, initially declining bathing/dressing but ultimately agreeable with encouragement.  Pt completed lateral scoot transfer recliner > EOB with supervision.  Pt engaged in bathing with lateral leans on EOB with setup/supervision.  Pt donned underwear sit > stand with RW with close supervision, however required CGA for dynamic standing balance when pulling pants over hips.  Pt completed lateral scoot transfer bed > w/c with supervision.  Engaged in oral care in sitting at sink Mod I.  Therapist reiterated education on limb inspection routinely to ensure proper healing and progress towards prosthesis.  Pt remained upright in w/c with seat belt alarm on and all needs in reach.  Therapy Documentation Precautions:  Precautions Precautions: Fall Precaution Comments: recent L BKA Restrictions Weight Bearing Restrictions: Yes LLE Weight Bearing: Non weight bearing General:   Vital Signs: Therapy Vitals Temp: 99.6 F (37.6 C) Temp Source: Oral Pulse Rate: 82 Resp: 18 BP: (!) 144/86 Patient Position (if appropriate): Sitting Oxygen Therapy SpO2: 98 % O2 Device: Room Air Pain:   ADL:   Vision   Perception    Praxis   Exercises:   Other Treatments:     Therapy/Group: Individual Therapy  [name] 05/24/2020, 8:48 AM

## 2020-05-24 NOTE — Progress Notes (Signed)
Occupational Therapy Discharge Summary  Patient Details  Name: Jonathon Bailey MRN: [id number] Date of Birth: 05-Feb-1956    Patient has met 7 of 9 long term goals due to improved activity tolerance, improved balance, postural control, ability to compensate for deficits and improved awareness.  Patient to discharge at overall Supervision- CGA level.  Patient's care partner unavailable to provide the necessary physical assistance at discharge.    Reasons goals not met:  Pt continues to require CGA for dynamic standing balance during LB dressing and clothing management post toileting.  Recommendation:  Patient will benefit from ongoing skilled OT services in skilled nursing facility setting to continue to advance functional skills in the area of BADL and Reduce care partner burden.    Equipment: No equipment provided  Reasons for discharge: treatment goals met and discharge from hospital  Patient/family agrees with progress made and goals achieved: Yes  OT Discharge Precautions/Restrictions  Precautions Precautions: Fall Precaution Comments: recent L BKA Restrictions Weight Bearing Restrictions: Yes LLE Weight Bearing: Non weight bearing General   Vital Signs Therapy Vitals Temp: 99.6 F (37.6 C) Temp Source: Oral Pulse Rate: 82 Resp: 18 BP: (!) 144/86 Patient Position (if appropriate): Sitting Oxygen Therapy SpO2: 98 % O2 Device: Room Air Pain Pain Assessment Pain Scale: 0-10 Pain Score: 0-No pain ADL ADL Eating: Independent Grooming: Independent Upper Body Bathing: Setup, Supervision/safety Where Assessed-Upper Body Bathing: Edge of bed Lower Body Bathing: Supervision/safety, Setup Where Assessed-Lower Body Bathing: Edge of bed Upper Body Dressing: Setup Where Assessed-Upper Body Dressing: Edge of bed Lower Body Dressing: Contact guard Where Assessed-Lower Body Dressing: Edge of bed (sit > stand) Toileting: Contact guard (sit > stand) Toilet Transfer:  Close supervision Toilet Transfer Method: Glass blower/designer: Extra wide drop arm bedside commode Vision Baseline Vision/History: Macular Degeneration Patient Visual Report: No change from baseline Vision Assessment?: No apparent visual deficits Perception  Perception: Within Functional Limits Praxis Praxis: Intact Cognition Overall Cognitive Status: Within Functional Limits for tasks assessed Arousal/Alertness: Awake/alert Orientation Level: Oriented X4 Memory: Appears intact Awareness: Appears intact Problem Solving: Appears intact Safety/Judgment: Appears intact Sensation Sensation Light Touch: Appears Intact Proprioception: Appears Intact Coordination Gross Motor Movements are Fluid and Coordinated: Yes (UE) Fine Motor Movements are Fluid and Coordinated: Yes Motor  Motor Motor: Abnormal postural alignment and control Motor - Skilled Clinical Observations: grossly uncoordinated due to L BKA, decresaed balance/postural control, generalized weakness, and poor endurance. Mobility  Bed Mobility Bed Mobility: Rolling Right;Rolling Left;Sit to Supine;Supine to Sit Rolling Right: Independent with assistive device Rolling Left: Independent with assistive device Supine to Sit: Independent with assistive device Sit to Supine: Independent with assistive device Transfers Sit to Stand: Supervision/Verbal cueing Stand to Sit: Supervision/Verbal cueing  Trunk/Postural Assessment  Cervical Assessment Cervical Assessment: Within Functional Limits Thoracic Assessment Thoracic Assessment: Within Functional Limits Lumbar Assessment Lumbar Assessment: Exceptions to [facility] (posterior pelvic tilt) Postural Control Postural Control: Deficits on evaluation  Balance Balance Balance Assessed: Yes Static Sitting Balance Static Sitting - Balance Support: Feet supported;Bilateral upper extremity supported Static Sitting - Level of Assistance: 7: Independent Dynamic Sitting  Balance Dynamic Sitting - Balance Support: Feet supported;Bilateral upper extremity supported Dynamic Sitting - Level of Assistance: 7: Independent Static Standing Balance Static Standing - Balance Support: Bilateral upper extremity supported (RW) Static Standing - Level of Assistance: 5: Stand by assistance (supervision) Dynamic Standing Balance Dynamic Standing - Balance Support: Bilateral upper extremity supported (RW) Dynamic Standing - Level of Assistance: 5: Stand by assistance (CGA) Extremity/Trunk  Assessment RUE Assessment RUE Assessment: Within Functional Limits LUE Assessment LUE Assessment: Within Functional Limits   HOXIE, [place] OTR/L 05/24/2020, 8:51 AM

## 2020-05-24 NOTE — Progress Notes (Signed)
Subjective: No complaints for dialysis today  Objective Vital signs in last 24 hours: Vitals:   05/23/20 0503 05/23/20 1541 05/23/20 1935 05/24/20 0508  BP: (!) 143/101 122/66 137/78 (!) 144/86  Pulse: 80 87 81 82  Resp: 17 15 17 18   Temp: 98.3 F (36.8 C) 98.6 F (37 C) 100.2 F (37.9 C) 99.6 F (37.6 C)  TempSrc:   Oral Oral  SpO2: 100% 94% 97% 98%  Weight:      Height:       Weight change:   Physical Exam: General: Obese male, NAD Heart: RRR, no MR G Lungs: CTA, nonlabored breathing Abdomen: Obese, soft, bowel sounds normoactive, ND, NT Extremities: Left BKA no stump edema right lower extremity trace pedal edema Dialysis Access: Left IJ PermCath dressing dry clear nontender  Dialysis Orders: TTS [facility] 4.5h 400/800 126kg 2/2.25 bath Hep 12000bolusTDC -Mircera80mcg q 2wks - just reordered/not yet given,lastdose1/26/21 -Hectorol56mcg IV qHD  Problem/Plan: 1.PAD w/L leg ischemia +gangrene - , s/p Vanc/Zosyn course. VVS consulted, b/l LE arteriogram 8/6 with severe b/l PAD below the knees, L BKA on 8/9 by Dr. Donnetta Hutching. Now in CIR.  2. ESRD- on HD TTS.Marland KitchenLast K 3.9,<3.6 use 4K bath K bath.Next HD today on schedule. 3. Anemiaof CKD-Hgb^8.2=8/29. S/p 2 units pRBC 8/25. Continue aranesp 100 mcg qwk. tsat 6%, ferritin 218 - No IV iron d/t allergy. On PO iron BID.  4. Secondary hyperparathyroidism- CCa and phos at goal. Continue VDRA, binders and sensipar.  5.HTN/volume- BP144/86 this a.m.hx chronically low BP. Coreg reduced, may need to readjust if remains elevated. UF 2L removed 8/28 HD. Does not appear grossly volume overloaded. UF as tolerated. EDW will be lower on d/c. Last post weight ~ 122.9kg 6. Nutrition- Renal diet w/fluid restrictions.   On protein supplement with ALB 2.3 7.DMT2 8. CAD s/p CABG 9. Hx CVA 10. COPD  Ernest Haber, PA-C [facility] [phone] 05/24/2020,11:53 AM  LOS: 18 days   Labs: Basic  Metabolic Panel: Recent Labs  Lab 05/17/20 1420 05/19/20 1322 05/21/20 1344  NA 129* 131* 133*  K 4.0 3.6 3.9  CL 91* 94* 96*  CO2 24 24 24   GLUCOSE 131* 100* 143*  BUN 44* 32* 31*  CREATININE 11.05* 8.59* 8.54*  CALCIUM 8.2* 7.9* 8.5*  PHOS 5.1* 3.9 4.3   Liver Function Tests: Recent Labs  Lab 05/17/20 1420 05/19/20 1322 05/21/20 1344  ALBUMIN 2.3* 2.3* 2.3*   No results for input(s): LIPASE, AMYLASE in the last 168 hours. No results for input(s): AMMONIA in the last 168 hours. CBC: Recent Labs  Lab 05/17/20 1421 05/17/20 1421 05/19/20 1322 05/19/20 1322 05/20/20 0610 05/21/20 0512 05/22/20 0506  WBC 10.5   < > 9.7   < > 8.3 7.8 8.5  HGB 6.2*   < > 7.4*   < > 8.2* 7.6* 8.2*  HCT 22.2*   < > 25.9*   < > 28.8* 26.6* 28.8*  MCV 91.7  --  87.5  --  88.6 87.5 87.8  PLT 291   < > 259   < > 293 276 292   < > = values in this interval not displayed.   Cardiac Enzymes: No results for input(s): CKTOTAL, CKMB, CKMBINDEX, TROPONINI in the last 168 hours. CBG: Recent Labs  Lab 05/23/20 1138 05/23/20 1650 05/23/20 2036 05/24/20 0514 05/24/20 1122  GLUCAP 121* 144* 108* 89 96    Studies/Results: No results found. Medications:  . (feeding supplement) PROSource Plus  30 mL Oral BID  BM  . sodium chloride   Intravenous Once  . vitamin C  1,000 mg Oral BID  . aspirin  81 mg Oral QODAY  . atorvastatin  40 mg Oral QHS  . carvedilol  6.25 mg Oral BID WC  . cetaphil  1 application Topical Daily  . Chlorhexidine Gluconate Cloth  6 each Topical BID  . cinacalcet  90 mg Oral Q supper  . darbepoetin (ARANESP) injection - DIALYSIS  100 mcg Intravenous Q Thu-HD  . doxercalciferol  6 mcg Intravenous Q T,Th,Sa-HD  . ferrous gluconate  324 mg Oral BID WC  . heparin  5,000 Units Subcutaneous Q8H  . insulin aspart  0-15 Units Subcutaneous TID WC  . insulin glargine  20 Units Subcutaneous QHS  . melatonin  5 mg Oral QHS  . multivitamin  1 tablet Oral QHS  . pantoprazole  40  mg Oral Daily  . polyethylene glycol  17 g Oral BID  . sevelamer carbonate  4.8 g Oral TID WC  . sorbitol  30 mL Oral Q breakfast

## 2020-05-24 NOTE — Progress Notes (Signed)
Jonathon Bailey PHYSICAL MEDICINE & REHABILITATION PROGRESS NOTE   Subjective/Complaints: No complaints Dialysis today Hgb 8.1 Cr 9.74  ROS: Patient denies fever, rash, sore throat, blurred vision, nausea, vomiting, diarrhea, cough, shortness of breath or chest pain,  headache, or mood change.    Objective:   No results found. Recent Labs    05/22/20 0506 05/24/20 1353  WBC 8.5 8.3  HGB 8.2* 8.1*  HCT 28.8* 28.5*  PLT 292 326   Recent Labs    05/24/20 1353  NA 131*  K 4.4  CL 94*  CO2 24  GLUCOSE 111*  BUN 42*  CREATININE 9.74*  CALCIUM 8.4*    Intake/Output Summary (Last 24 hours) at 05/24/2020 1441 Last data filed at 05/23/2020 2201 Gross per 24 hour  Intake 220 ml  Output --  Net 220 ml     Physical Exam: Vital Signs Blood pressure 131/81, pulse 79, temperature 97.8 F (36.6 C), temperature source Oral, resp. rate 18, height 6' (1.829 m), weight 126.3 kg, SpO2 95 %. General: Alert and oriented x 3, No apparent distress HEENT: Head is normocephalic, atraumatic, PERRLA, EOMI, sclera anicteric, oral mucosa pink and moist, dentition intact, ext ear canals clear,  Neck: Supple without JVD or lymphadenopathy Heart: Reg rate and rhythm. No murmurs rubs or gallops Chest: CTA bilaterally without wheezes, rales, or rhonchi; no distress Abdomen: Soft, non-tender, non-distended, bowel sounds positive. Extremities: No clubbing, cyanosis, or edema. Pulses are 2+ Skin: Left BKA with dressing CDI Psych: Normal mood.  Normal behavior. Musc: Left BKA with improving edema and tenderness. Wearing shrinker Neuro: Alert Motor: Bilateral upper extremities: 5/5 proximal distal Left lower extremity: 4+/5 proximal distally, unchanged Right lower extremity: Hip flexion 4-4+/5 (pain inhibition)  Psych: Pt's affect is appropriate. Pt is cooperative   Assessment/Plan: 1. Functional deficits secondary to left BKA which require 3+ hours per day of interdisciplinary therapy in a  comprehensive inpatient rehab setting.  Physiatrist is providing close team supervision and 24 hour management of active medical problems listed below.  Physiatrist and rehab team continue to assess barriers to discharge/monitor patient progress toward functional and medical goals  Care Tool:  Bathing  Bathing activity did not occur: Refused Body parts bathed by patient: Left arm, Right arm, Chest, Abdomen, Front perineal area, Buttocks, Right upper leg, Left upper leg, Face   Body parts bathed by helper: Right lower leg Body parts n/a: Left lower leg   Bathing assist Assist Level: Supervision/Verbal cueing     Upper Body Dressing/Undressing Upper body dressing   What is the patient wearing?: Pull over shirt    Upper body assist Assist Level: Set up assist    Lower Body Dressing/Undressing Lower body dressing      What is the patient wearing?: Underwear/pull up, Pants     Lower body assist Assist for lower body dressing: Contact Guard/Touching assist     Toileting Toileting Toileting Activity did not occur (Clothing management and hygiene only): N/A (no void or bm)  Toileting assist Assist for toileting: Contact Guard/Touching assist     Transfers Chair/bed transfer  Transfers assist     Chair/bed transfer assist level: Supervision/Verbal cueing     Locomotion Ambulation   Ambulation assist   Ambulation activity did not occur: Safety/medical concerns  Assist level: 2 helpers Assistive device: Parallel bars Max distance: 37ft   Walk 10 feet activity   Assist  Walk 10 feet activity did not occur: Safety/medical concerns (L BKA, decreased balance/postural control, generalized weakness)  Walk 50 feet activity   Assist Walk 50 feet with 2 turns activity did not occur: Safety/medical concerns (L BKA, decreased balance/postural control, generalized weakness)         Walk 150 feet activity   Assist Walk 150 feet activity did not occur:  Safety/medical concerns (L BKA, decreased balance/postural control, generalized weakness)         Walk 10 feet on uneven surface  activity   Assist Walk 10 feet on uneven surfaces activity did not occur: Safety/medical concerns (L BKA, decreased balance/postural control, generalized weakness)         Wheelchair     Assist Will patient use wheelchair at discharge?: Yes Type of Wheelchair: Manual    Wheelchair assist level: Supervision/Verbal cueing Max wheelchair distance: 184ft    Wheelchair 50 feet with 2 turns activity    Assist        Assist Level: Supervision/Verbal cueing   Wheelchair 150 feet activity     Assist      Assist Level: Supervision/Verbal cueing   Blood pressure 131/81, pulse 79, temperature 97.8 F (36.6 C), temperature source Oral, resp. rate 18, height 6' (1.829 m), weight 126.3 kg, SpO2 95 %.    Medical Problem List and Plan: 1.  Limitations with mobility, transfers, endurance, self-care secondary to left BKA.  Continue CIR  Continue stump shrinker  2.  Antithrombotics: -DVT/anticoagulation:  Pharmaceutical: Heparin             -antiplatelet therapy: ASA daily  3. Pain Management: Oxycodone as needed   Controlled 8/31  . 4. Mood: Team support             -antipsychotic agents: N/a. Positive mood, joking 5. Neuropsych: This patient is capable of making decisions on his own behalf.  Discussed with Neuropsych, appreciate recs 6. Skin/Wound Care: Daily dressing changes. Monitor wound for healing.  Vitamin C for healing.  Prosource added to promote wound healing.  7. Fluids/Electrolytes/Nutrition: Monitor I/O.  Modified/renal diet with 1200 ccFR/day. Labs monitored with HD.  Patient still upset about diet as heart healthy, CM, renal diet recommended.  8.  T2DM with hyperglycemia:  Hgb A1c- 7.9.  Continue Lantus 20 units daily (On levemir 75 mg bid/Novolog 5 untis bid PTA- reports eats better at home). Continue to monitor BS ac/hs  and use SSI for elevated blood sugars. CBG (last 3)  Recent Labs    05/23/20 2036 05/24/20 0514 05/24/20 1122  GLUCAP 108* 89 96   Well controlled 8/31 9.  ESRD: Schedule hemodialysis at the end of the day on TTS to help with tolerance of activity.  Metabolic bone disease managed with Hectorol and Renvela. 10.  COPD: Respiratory status stable on Incruse daily. With congestion- CXR shows atelectasis. Encourage use of IS. Breathing comfortably.  11.  Anemia of chronic disease: Serial CBCs with hemodialysis.  On IV Aranesp weekly with Wynonia Lawman twice daily.  Hemoglobin 8.2 on 8/27, Hgb 8.2 on 8/29, hgb 8.1 on 8/31  Continue to monitor 12.  History of CAD: Carvedilol decreased due to hypotension --continue Coreg 3.125 mg bid with Lipitor and ASA. 13.  Hypertension  BP still elevated  8/30 increase coreg to 6.25mg  bid   Vitals:   05/24/20 1400 05/24/20 1430  BP: 135/80 131/81  Pulse: 78 79  Resp:    Temp:    SpO2:     14.  Leukocytosis: Resolved  Afebrile  Continue to monitor 15.  Drug-induced constipation  Abdominal x-ray unremarkable  Bowel meds increased  on 8/18  Some improvement  Patient noncompliant with bowel meds 16. GERD PPI 40mg  qd  LOS: 18 days A FACE TO FACE EVALUATION WAS PERFORMED  Clide Deutscher Jonathon Bailey 05/24/2020, 2:41 PM

## 2020-05-24 NOTE — Progress Notes (Addendum)
Spoke with [facility] who reports they will need to verify pt has gone back to medicare coverage. When dis-enrolled him from [employer] they would take three business days which is wed. Will ask [facility] to confirm coverage. Will need to verify Medicare current and hopefully will go on Thursday after HD

## 2020-05-24 NOTE — Progress Notes (Signed)
Physical Therapy Session Note  Patient Details  Name: Jonathon Bailey MRN: [id number] Date of Birth: 08/12/1956  Today's Date: 05/24/2020 PT Individual Time: 1000-1110 PT Individual Time Calculation (min): 70 min   Short Term Goals: Week 2:  PT Short Term Goal 1 (Week 2): Pt will transfer bed<>chair with LRAD CGA PT Short Term Goal 1 - Progress (Week 2): Met PT Short Term Goal 2 (Week 2): Pt will transfer sit<>stand with LRAD CGA PT Short Term Goal 2 - Progress (Week 2): Progressing toward goal PT Short Term Goal 3 (Week 2): Pt will initiate gait training PT Short Term Goal 3 - Progress (Week 2): Met Week 3:  PT Short Term Goal 1 (Week 3): Pt will transfer stand<>pivot with LRAD CGA PT Short Term Goal 2 (Week 3): Pt will transfer sit<>stand with LRAD CGA consistantly PT Short Term Goal 3 (Week 3): Pt will ambulate 26ft with LRAD min A of 1  Skilled Therapeutic Interventions/Progress Updates:    Received pt sitting in WC, pt agreeable to therapy, and denied any pain at start of session but reported increased R shoulder pain with mobility. Session with emphasis on discharge planning, functional mobility/transfers, generalized strengthening, dynamic standing balance/coordination, simulated car transfers, and improved activity tolerance. Pt transported to ortho gym in [facility] total A due to pain in R shoulder. Pt performed simulated car transfer via lateral scoot with supervision. Pt able to manage WC parts with supervision (legrests, armrests, brakes). Pt performed bilateral UE strengthening on UBE at level 3 for 5 minutes forward and 5 minutes backwards with supervision. Pt transported to dayroom in [facility] total A and transferred bed<>mat via lateral scoot with supervision x 2 additional trials. Pt transferred sit<>stand with RW and supervision and performed L hip flexion and L hip abduction 2 x10 with supervision and bilateral UE support on RW. Pt performed the following exercises with supervision and  verbal cues for technique: -tricep extension on yoga blocks 1x10 -hip flexion x12 bilaterally with 3lb ankle weight on R LE -knee extension x12 bilaterally with 3lb ankle weight on R LE -horizontal chest press with 4lb dowel x10 Pt transported back to room in [facility] total A. Concluded session with pt sitting in WC, needs within reach, and seatbelt alarm on.   Therapy Documentation Precautions:  Precautions Precautions: Fall Precaution Comments: recent L BKA Restrictions Weight Bearing Restrictions: Yes LLE Weight Bearing: Non weight bearing  Therapy/Group: Individual Therapy Alfonse Alpers PT, DPT   05/24/2020, 12:20 PM

## 2020-05-25 ENCOUNTER — Inpatient Hospital Stay (HOSPITAL_COMMUNITY): Payer: No Typology Code available for payment source | Admitting: Occupational Therapy

## 2020-05-25 ENCOUNTER — Inpatient Hospital Stay (HOSPITAL_COMMUNITY): Payer: No Typology Code available for payment source

## 2020-05-25 LAB — GLUCOSE, CAPILLARY
Glucose-Capillary: 78 mg/dL (ref 70–99)
Glucose-Capillary: 94 mg/dL (ref 70–99)
Glucose-Capillary: 94 mg/dL (ref 70–99)
Glucose-Capillary: 121 mg/dL — ABNORMAL HIGH (ref 70–99)
Glucose-Capillary: 162 mg/dL — ABNORMAL HIGH (ref 70–99)

## 2020-05-25 MED ORDER — DARBEPOETIN ALFA 200 MCG/0.4ML IJ SOSY
200.0000 ug | PREFILLED_SYRINGE | INTRAMUSCULAR | Status: DC
  Administered 2020-05-26: 200 ug via INTRAVENOUS
  Filled 2020-05-25: qty 0.4

## 2020-05-25 NOTE — Progress Notes (Signed)
Patient ID: Jonathon Bailey, male   DOB: 1956-03-29, 64 y.o.   MRN: [id number] The patient is seen on the rehab unit.  Plan for discharge tomorrow noted.  Left below-knee amputation dressing removed.  He does have superficial skin loss on the tibial prominence and also on the anterior flap at the skin edge.  Posterior flap with no skin loss.  This does not appear to be full-thickness.  Status post left below-knee amputation on 05/02/2020.  Will see him in our office for staple removal on 06/02/2020.  Will watch evolution of epidermolysis on the anterior portion of his flap.

## 2020-05-25 NOTE — Progress Notes (Signed)
Physical Therapy Session Note  Patient Details  Name: Jonathon Bailey MRN: [id number] Date of Birth: 03/13/1956  Today's Date: 05/25/2020 PT Individual Time: 1300-1342 PT Individual Time Calculation (min): 42 min   Short Term Goals: Week 2:  PT Short Term Goal 1 (Week 2): Pt will transfer bed<>chair with LRAD CGA PT Short Term Goal 1 - Progress (Week 2): Met PT Short Term Goal 2 (Week 2): Pt will transfer sit<>stand with LRAD CGA PT Short Term Goal 2 - Progress (Week 2): Progressing toward goal PT Short Term Goal 3 (Week 2): Pt will initiate gait training PT Short Term Goal 3 - Progress (Week 2): Met Week 3:  PT Short Term Goal 1 (Week 3): Pt will transfer stand<>pivot with LRAD CGA PT Short Term Goal 2 (Week 3): Pt will transfer sit<>stand with LRAD CGA consistantly PT Short Term Goal 3 (Week 3): Pt will ambulate 10ft with LRAD min A of 1  Skilled Therapeutic Interventions/Progress Updates:   Received pt sitting in recliner stating "I'm tired I don't want to do anything." Pt agreeable to therapy after convincing and did not state pain level. Session with emphasis on functional mobility/transfers, generalized strengthening, dynamic sitting balance/coordination, and improved activity tolerance. Pt attempted to stand from recliner with RW, however surface too low and pt unable to stand. Pt transferred recliner<>bed<>WC via lateral scoot with supervision and transported to dayroom in WC total A for time management purposes. Worked on dynamic sitting balance, reaching outside BOS, fine motor control, and problem solving/reaction time playing card games war and slap jack. Therapist taught pt new game and pt able to catch on to rules of game quickly and ended up winning game. Pt transported back to room in WC total A and transferred WC<>recliner via lateral scoot with supervision with therapist stabilizing both chairs. Concluded session with pt sitting in recliner, needs within reach, and chair pad  alarm on.   Therapy Documentation Precautions:  Precautions Precautions: Fall Precaution Comments: recent L BKA Restrictions Weight Bearing Restrictions: Yes LLE Weight Bearing: Non weight bearing  Therapy/Group: Individual Therapy Anna M Johnson  Anna Johnson PT, DPT   05/25/2020, 7:38 AM  

## 2020-05-25 NOTE — Progress Notes (Signed)
Occupational Therapy Session Note  Patient Details  Name: Jonathon Bailey MRN: [id number] Date of Birth: 01/11/1956  Today's Date: 05/25/2020 OT Individual Time: 2706-2376 and 1435-1500 OT Individual Time Calculation (min): 45 min (missed 15 min) and 25 min   Short Term Goals: Week 3:  OT Short Term Goal 1 (Week 3): STG = LTGs due to remaining ELOS  Skilled Therapeutic Interventions/Progress Updates:    1) Treatment session with focus on care for residual limb and advocating for self in regards to continued therapy at SNF and preparation for prosthesis.  Pt received upright in recliner reporting not wanting to do anything because he was "too cold".  Provided pt with "First Step" publication and focused on pain management, care for residual limb with limb inspection and massage, and referenced areas that discussed prosthetic options.  Pt asking multiple questions about prosthetic options and fit, referenced section in publication and that prosthetist will work with him to select the best option.  Again encouraged pt to engage in any sit > stand or standing, with pt declining at this time.  Pt missed remaining 15 mins due to refusal.  2) Treatment session with focus on sit > stand and d/c planning.  Pt received upright in recliner with SWK present to discuss update regarding awaiting SNF placement.  Discussed progress towards goals with current need for CGA for dynamic standing balance due to decreased postural stability.  Pt engaged in sit > stand from low recliner with min assist and facilitation for anterior weight shift to come to upright standing.  Engaged in dynamic standing balance incorporating reach with alternating UE to continue to address balance during LB dressing.  Pt returned to sitting in recliner and left upright with all needs in reach.  Therapy Documentation Precautions:  Precautions Precautions: Fall Precaution Comments: recent L BKA Restrictions Weight Bearing  Restrictions: Yes LLE Weight Bearing: Non weight bearing General: General OT Amount of Missed Time: 15 Minutes Pain:  Pt with no c/o pain in either treatment session ADL: ADL Eating: Independent Grooming: Independent Upper Body Bathing: Setup, Supervision/safety Where Assessed-Upper Body Bathing: Edge of bed Lower Body Bathing: Supervision/safety, Setup Where Assessed-Lower Body Bathing: Edge of bed Upper Body Dressing: Setup Where Assessed-Upper Body Dressing: Edge of bed Lower Body Dressing: Contact guard Where Assessed-Lower Body Dressing: Edge of bed (sit > stand) Toileting: Contact guard (sit > stand) Toilet Transfer: Close supervision Toilet Transfer Method: Sit pivot Science writer: Extra wide drop arm bedside commode   Therapy/Group: Individual Therapy  [name] 05/25/2020, 9:33 AM

## 2020-05-25 NOTE — Progress Notes (Signed)
[place] PHYSICAL MEDICINE & REHABILITATION PROGRESS NOTE   Subjective/Complaints: No complaints this morning. Watching TV Discussed SW says d/c to SNF will likely be tomorrow after HD and he is agreeable  ROS: Patient denies fever, rash, sore throat, blurred vision, nausea, vomiting, diarrhea, cough, shortness of breath or chest pain,  headache, or mood change.    Objective:   No results found. Recent Labs    05/24/20 1353  WBC 8.3  HGB 8.1*  HCT 28.5*  PLT 326   Recent Labs    05/24/20 1353  NA 131*  K 4.4  CL 94*  CO2 24  GLUCOSE 111*  BUN 42*  CREATININE 9.74*  CALCIUM 8.4*    Intake/Output Summary (Last 24 hours) at 05/25/2020 0927 Last data filed at 05/25/2020 0743 Gross per 24 hour  Intake 472 ml  Output 2000 ml  Net -1528 ml     Physical Exam: Vital Signs Blood pressure 133/79, pulse 85, temperature 98.2 F (36.8 C), resp. rate 18, height 6' (1.829 m), weight 126.3 kg, SpO2 96 %. General: Alert and oriented x 3, No apparent distress HEENT: Head is normocephalic, atraumatic, PERRLA, EOMI, sclera anicteric, oral mucosa pink and moist, dentition intact, ext ear canals clear,  Neck: Supple without JVD or lymphadenopathy Heart: Reg rate and rhythm. No murmurs rubs or gallops Chest: CTA bilaterally without wheezes, rales, or rhonchi; no distress Abdomen: Soft, non-tender, non-distended, bowel sounds positive. Extremities: No clubbing, cyanosis, or edema. Pulses are 2+ Skin: Left BKA with dressing CDI, IV right arm Psych: Normal mood.  Normal behavior. Musc: Left BKA with improving edema and tenderness. Wearing shrinker Neuro: Alert Motor: Bilateral upper extremities: 5/5 proximal distal Left lower extremity: 4+/5 proximal distally, unchanged Right lower extremity: Hip flexion 4-4+/5 (pain inhibition)  Psych: Pt's affect is appropriate. Pt is cooperative    Assessment/Plan: 1. Functional deficits secondary to left BKA which require 3+ hours per day of  interdisciplinary therapy in a comprehensive inpatient rehab setting.  Physiatrist is providing close team supervision and 24 hour management of active medical problems listed below.  Physiatrist and rehab team continue to assess barriers to discharge/monitor patient progress toward functional and medical goals  Care Tool:  Bathing  Bathing activity did not occur: Refused Body parts bathed by patient: Left arm, Right arm, Chest, Abdomen, Front perineal area, Buttocks, Right upper leg, Left upper leg, Face   Body parts bathed by helper: Right lower leg Body parts n/a: Left lower leg   Bathing assist Assist Level: Supervision/Verbal cueing     Upper Body Dressing/Undressing Upper body dressing   What is the patient wearing?: Pull over shirt    Upper body assist Assist Level: Set up assist    Lower Body Dressing/Undressing Lower body dressing      What is the patient wearing?: Underwear/pull up, Pants     Lower body assist Assist for lower body dressing: Contact Guard/Touching assist     Toileting Toileting Toileting Activity did not occur (Clothing management and hygiene only): N/A (no void or bm)  Toileting assist Assist for toileting: Contact Guard/Touching assist     Transfers Chair/bed transfer  Transfers assist     Chair/bed transfer assist level: Supervision/Verbal cueing     Locomotion Ambulation   Ambulation assist   Ambulation activity did not occur: Safety/medical concerns  Assist level: 2 helpers Assistive device: Parallel bars Max distance: 33ft   Walk 10 feet activity   Assist  Walk 10 feet activity did not occur: Safety/medical  concerns (L BKA, decreased balance/postural control, generalized weakness)        Walk 50 feet activity   Assist Walk 50 feet with 2 turns activity did not occur: Safety/medical concerns (L BKA, decreased balance/postural control, generalized weakness)         Walk 150 feet activity   Assist Walk 150  feet activity did not occur: Safety/medical concerns (L BKA, decreased balance/postural control, generalized weakness)         Walk 10 feet on uneven surface  activity   Assist Walk 10 feet on uneven surfaces activity did not occur: Safety/medical concerns (L BKA, decreased balance/postural control, generalized weakness)         Wheelchair     Assist Will patient use wheelchair at discharge?: Yes Type of Wheelchair: Manual    Wheelchair assist level: Supervision/Verbal cueing Max wheelchair distance: 172ft    Wheelchair 50 feet with 2 turns activity    Assist        Assist Level: Supervision/Verbal cueing   Wheelchair 150 feet activity     Assist      Assist Level: Supervision/Verbal cueing   Blood pressure 133/79, pulse 85, temperature 98.2 F (36.8 C), resp. rate 18, height 6' (1.829 m), weight 126.3 kg, SpO2 96 %.    Medical Problem List and Plan: 1.  Limitations with mobility, transfers, endurance, self-care secondary to left BKA.  Continue CIR  Continue stump shrinker  2.  Antithrombotics: -DVT/anticoagulation:  Pharmaceutical: Heparin             -antiplatelet therapy: ASA daily  3. Pain Management: Oxycodone as needed   Controlled 9/1  . 4. Mood: Team support             -antipsychotic agents: N/a. Positive mood, joking 5. Neuropsych: This patient is capable of making decisions on his own behalf.  Discussed with Neuropsych, appreciate recs 6. Skin/Wound Care: Daily dressing changes. Monitor wound for healing.  Vitamin C for healing.  Prosource added to promote wound healing.  7. Fluids/Electrolytes/Nutrition: Monitor I/O.  Modified/renal diet with 1200 ccFR/day. Labs monitored with HD.  Patient still upset about diet as heart healthy, CM, renal diet recommended.  8.  T2DM with hyperglycemia:  Hgb A1c- 7.9.  Continue Lantus 20 units daily (On levemir 75 mg bid/Novolog 5 untis bid PTA- reports eats better at home). Continue to monitor BS  ac/hs and use SSI for elevated blood sugars. CBG (last 3)  Recent Labs    05/24/20 2106 05/25/20 0523 05/25/20 0630  GLUCAP 152* 78 94   Well controlled 9/1 9.  ESRD: Schedule hemodialysis at the end of the day on TTS to help with tolerance of activity.  Metabolic bone disease managed with Hectorol and Renvela. 10.  COPD: Respiratory status stable on Incruse daily. With congestion- CXR shows atelectasis. Encourage use of IS. Breathing comfortably.  11.  Anemia of chronic disease: Serial CBCs with hemodialysis.  On IV Aranesp weekly with Wynonia Lawman twice daily.  Hemoglobin 8.2 on 8/27, Hgb 8.2 on 8/29, hgb 8.1 on 8/31  Continue to monitor 12.  History of CAD: Carvedilol decreased due to hypotension --continue Coreg 3.125 mg bid with Lipitor and ASA. 13.  Hypertension  BP still elevated  8/30 increase coreg to 6.25mg  bid  9/1: BP well controlled   Vitals:   05/24/20 1929 05/25/20 0517  BP: (!) 144/76 133/79  Pulse: 86 85  Resp: 19 18  Temp: 97.7 F (36.5 C) 98.2 F (36.8 C)  SpO2: 97% 96%  14.  Leukocytosis: Resolved  Afebrile  Continue to monitor 15.  Drug-induced constipation  Abdominal x-ray unremarkable  Bowel meds increased on 8/18  Some improvement  Patient noncompliant with bowel meds 16. GERD PPI 40mg  qd  LOS: 19 days A FACE TO FACE EVALUATION WAS PERFORMED  Jonathon Bailey 05/25/2020, 9:27 AM

## 2020-05-25 NOTE — Progress Notes (Signed)
Subjective: Seen in room, no complaints said tolerated dialysis yesterday discussed need for dialysis will try dialysis in recliner chair tomorrow  Objective Vital signs in last 24 hours: Vitals:   05/24/20 1700 05/24/20 1718 05/24/20 1929 05/25/20 0517  BP: (!) 173/95 (!) 159/93 (!) 144/76 133/79  Pulse: 72 74 86 85  Resp:  18 19 18   Temp:  97.8 F (36.6 C) 97.7 F (36.5 C) 98.2 F (36.8 C)  TempSrc:  Oral Oral   SpO2:  95% 97% 96%  Weight:      Height:       Weight change:   Physical Exam: General:Obese male, NAD Heart:RRR, no MRG Lungs:CTA, nonlabored breathing Abdomen:Obese, soft, bowel sounds normoactive, ND, NT Extremities:Left BKA no stump edema right lower extremity 1+ pedaledema Dialysis Access:Left IJ PermCath dressing dry clear nontender  Dialysis Orders: TTS [facility] 4.5h 400/800 126kg 2/2.25 bath Hep 12000bolusTDC -Mircera56mcg q 2wks - just reordered/not yet given,lastdose1/26/21 -Hectorol82mcg IV qHD  Problem/Plan: 1.PAD w/L leg ischemia +gangrene - , s/p Vanc/Zosyn course. VVS consulted, b/l LE arteriogram 8/6 with severe b/l PAD below the knees, L BKA on 8/9 by Dr. Donnetta Hutching. Now in CIR.  2. ESRD- on HD TTS.Marland KitchenLast K 4.4 use 2K bath tomorrow HD 3. Anemiaof CKD-Hgb 8.2> 8.1 =8/ 31. S/p 2 units pRBC 8/25.  Increase aranesp 100 to 200 mcg q Thursday HD . tsat 6%, ferritin 218 - No IV iron d/t allergy. On PO iron BID.  4. Secondary hyperparathyroidism- CCa and phos at goal. Continue VDRA, binders and sensipar.  5.HTN/volume- BPstable this a.m.hx chronically low BP. Coreg reduced, may need to readjust if remains elevated. UF 2L yesterday HD tolerated, bed WT 126.3 at his EDW, with left BKA pedal edema needs lower dry weight , he states  can stand on scale, PT in room today with practice today , attempt 3 L UF tomorrow HD  6. Nutrition- Renal diet w/fluid restrictions.Onprotein supplement with ALB 2. 5 7.DMT2 8. CAD s/p  CABG 9. Hx CVA 10. COPD  Ernest Haber, PA-C [facility] Beeper [phone] 05/25/2020,10:15 AM  LOS: 19 days   Labs: Basic Metabolic Panel: Recent Labs  Lab 05/19/20 1322 05/21/20 1344 05/24/20 1353  NA 131* 133* 131*  K 3.6 3.9 4.4  CL 94* 96* 94*  CO2 24 24 24   GLUCOSE 100* 143* 111*  BUN 32* 31* 42*  CREATININE 8.59* 8.54* 9.74*  CALCIUM 7.9* 8.5* 8.4*  PHOS 3.9 4.3 4.7*   Liver Function Tests: Recent Labs  Lab 05/19/20 1322 05/21/20 1344 05/24/20 1353  ALBUMIN 2.3* 2.3* 2.5*   No results for input(s): LIPASE, AMYLASE in the last 168 hours. No results for input(s): AMMONIA in the last 168 hours. CBC: Recent Labs  Lab 05/19/20 1322 05/19/20 1322 05/20/20 0610 05/20/20 0610 05/21/20 0512 05/22/20 0506 05/24/20 1353  WBC 9.7   < > 8.3   < > 7.8 8.5 8.3  HGB 7.4*   < > 8.2*   < > 7.6* 8.2* 8.1*  HCT 25.9*   < > 28.8*   < > 26.6* 28.8* 28.5*  MCV 87.5  --  88.6  --  87.5 87.8 88.0  PLT 259   < > 293   < > 276 292 326   < > = values in this interval not displayed.   Cardiac Enzymes: No results for input(s): CKTOTAL, CKMB, CKMBINDEX, TROPONINI in the last 168 hours. CBG: Recent Labs  Lab 05/24/20 1122 05/24/20 1751 05/24/20 2106 05/25/20 0523 05/25/20 0630  GLUCAP 96 82 152* 78 94    Studies/Results: No results found. Medications:  . (feeding supplement) PROSource Plus  30 mL Oral BID BM  . sodium chloride   Intravenous Once  . vitamin C  1,000 mg Oral BID  . aspirin  81 mg Oral QODAY  . atorvastatin  40 mg Oral QHS  . carvedilol  6.25 mg Oral BID WC  . cetaphil  1 application Topical Daily  . Chlorhexidine Gluconate Cloth  6 each Topical BID  . cinacalcet  90 mg Oral Q supper  . darbepoetin (ARANESP) injection - DIALYSIS  100 mcg Intravenous Q Thu-HD  . doxercalciferol  6 mcg Intravenous Q T,Th,Sa-HD  . ferrous gluconate  324 mg Oral BID WC  . heparin  5,000 Units Subcutaneous Q8H  . insulin aspart  0-15 Units Subcutaneous  TID WC  . insulin glargine  20 Units Subcutaneous QHS  . melatonin  5 mg Oral QHS  . multivitamin  1 tablet Oral QHS  . pantoprazole  40 mg Oral Daily  . polyethylene glycol  17 g Oral BID  . sevelamer carbonate  4.8 g Oral TID WC  . sorbitol  30 mL Oral Q breakfast

## 2020-05-25 NOTE — Patient Care Conference (Signed)
Inpatient RehabilitationTeam Conference and Plan of Care Update Date: 05/25/2020   Time: 11:42 AM    Patient Name: Jonathon Bailey      Medical Record Number: [id number]  Date of Birth: 08-Jun-1956 Sex: Male         Room/Bed: 4W04C/4W04C-01 Payor Info: Payor: MEDICARE / Plan: MEDICARE PART A AND B / Product Type: *No Product type* /    Admit Date/Time:  05/06/2020  4:09 PM  Primary Diagnosis:  S/P BKA (below knee amputation) unilateral, left [facility])  Hospital Problems: Principal Problem:   S/P BKA (below knee amputation) unilateral, left (HCC) Active Problems:   Leukocytosis   Essential hypertension   Labile blood pressure   Controlled type 2 diabetes mellitus with hyperglycemia, with long-term current use of insulin (HCC)   Drug induced constipation   Abdominal distension   Constipation   Noncompliance   ESRD on dialysis [facility])    Expected Discharge Date: Expected Discharge Date:  (SNF placement pending insurance clearance)  Team Members Present: Physician leading conference: Dr. Leeroy Cha Care Coodinator Present: Dorien Chihuahua, RN, BSN, CRRN;Other (comment) Jacqlyn Larsen Dupree, SW) Nurse Present: Isla Pence, RN PT Present: Becky Sax, PT OT Present: [name], OT PPS Coordinator present : Gunnar Fusi, Novella Olive, PT     Current Status/Progress Goal Weekly Team Focus  Bowel/Bladder   pt is continnet of bowel, and is oliguric. LBM 05/23/20  to remain cont of bowel and bladder  Assess q shift and prn   Swallow/Nutrition/ Hydration             ADL's   Supervision bathing with lateral leans, Setup UB dressing, Min A - CGA LB dressing at sit > stand, Supervision lateral scoot transfers  Min assist sit > stand, Supervision bathing, dressing, and toilet transfers/toileting  ADL retraining, activity tolerance/endurance, sit > stand, standing balance, transfers, care for residual limb   Mobility   bed mobility mod I/supervision, lateral scoot transfers supervision,  stand<>pivot CGA/min A, sit<>stand supervision, gait 76ft in // bars min A +2, WC mobility 186ft supervision  supervision  functional mobility/transfers, generalized strengthening, dynamic standing balance/coordination, ambulation, endurance, D/C planning, limb loss education   Communication             Safety/Cognition/ Behavioral Observations            Pain   pt has no current complaints of pain  remain pain free  Assess pain q shift and prn   Skin   left BKA  no breakdown or infection at surgical site/  Assess q shift and prn, change dressing when needed     Discharge Planning:  [place] checking to see if pt's Medicare switched back so can transfer to facility tomorrow. Pt aware and agreeable   Team Discussion: Patient changed insurance last week; attempting to correct for coverage of care at discharge. Note some drainage to incision site. Progress impaired by fatigue, lack of motivation, poor endurance.  Patient on target to meet rehab goals: yes  *See Care Plan and progress notes for long and short-term goals.   Revisions to Treatment Plan:    Teaching Needs: Incision care, supervision for care, medications, etc. Self care for residual limb  Current Barriers to Discharge: Decreased caregiver support, Wound care, Insurance for SNF coverage and Behavior  Possible Resolutions to Barriers: SNF placement at discharge pending insurance clearance     Medical Summary Current Status: Pain is well controlled, has some serosanginous drainage from residual limb, ESRD, BP well controlled  Barriers to Discharge: Hemodialysis;Medical stability;Insurance for SNF coverage  Barriers to Discharge Comments: Pain is well controlled, has some serosanginous drainage from residual limb, ESRD, essential HTN Possible Resolutions to [employer] Focus: Dc tomorrow after HD (pending insurance issues), continued oxycodone for residual limb pain, daily wound care, monitor vitals TID,  education regarding limb care   Continued Need for Acute Rehabilitation Level of Care: The patient requires daily medical management by a physician with specialized training in physical medicine and rehabilitation for the following reasons: Direction of a multidisciplinary physical rehabilitation program to maximize functional independence : Yes Medical management of patient stability for increased activity during participation in an intensive rehabilitation regime.: Yes Analysis of laboratory values and/or radiology reports with any subsequent need for medication adjustment and/or medical intervention. : Yes   I attest that I was present, lead the team conference, and concur with the assessment and plan of the team.   Dorien Chihuahua B 05/25/2020, 3:11 PM

## 2020-05-25 NOTE — Progress Notes (Addendum)
Patient ID: Jonathon Bailey, male   DOB: [birth year], 64 y.o.   MRN: [id number]  Met with pt to discuss plan. He will need to have a confirmation letter for [employer] to take due to switch of medicare. Attempted to call [facility] and were told having technical difficulities and would need to call back in 1-2 hours. Continuing to work on getting pt transferred to [employer]. He is aware of his progress toward his goals and is ready to change venues.   3:20 PM have attempted to call [facility] again and again they are having technical difficulity and can not assist pt with his request. He will try again in one hour as instructed.

## 2020-05-26 ENCOUNTER — Inpatient Hospital Stay (HOSPITAL_COMMUNITY): Payer: No Typology Code available for payment source | Admitting: Occupational Therapy

## 2020-05-26 ENCOUNTER — Inpatient Hospital Stay (HOSPITAL_COMMUNITY): Payer: No Typology Code available for payment source

## 2020-05-26 LAB — RENAL FUNCTION PANEL
Albumin: 2.6 g/dL — ABNORMAL LOW (ref 3.5–5.0)
Anion gap: 12 (ref 5–15)
BUN: 38 mg/dL — ABNORMAL HIGH (ref 8–23)
CO2: 24 mmol/L (ref 22–32)
Calcium: 8.5 mg/dL — ABNORMAL LOW (ref 8.9–10.3)
Chloride: 97 mmol/L — ABNORMAL LOW (ref 98–111)
Creatinine, Ser: 9.01 mg/dL — ABNORMAL HIGH (ref 0.61–1.24)
GFR calc Af Amer: 6 mL/min — ABNORMAL LOW (ref >60)
GFR calc non Af Amer: 6 mL/min — ABNORMAL LOW (ref >60)
Glucose, Bld: 116 mg/dL — ABNORMAL HIGH (ref 70–99)
Phosphorus: 4.9 mg/dL — ABNORMAL HIGH (ref 2.5–4.6)
Potassium: 4.2 mmol/L (ref 3.5–5.1)
Sodium: 133 mmol/L — ABNORMAL LOW (ref 135–145)

## 2020-05-26 LAB — CBC
HCT: 29.3 % — ABNORMAL LOW (ref 39.0–52.0)
Hemoglobin: 8.2 g/dL — ABNORMAL LOW (ref 13.0–17.0)
MCH: 24.6 pg — ABNORMAL LOW (ref 26.0–34.0)
MCHC: 28 g/dL — ABNORMAL LOW (ref 30.0–36.0)
MCV: 87.7 fL (ref 80.0–100.0)
Platelets: 290 10*3/uL (ref 150–400)
RBC: 3.34 MIL/uL — ABNORMAL LOW (ref 4.22–5.81)
RDW: 18.9 % — ABNORMAL HIGH (ref 11.5–15.5)
WBC: 8.4 10*3/uL (ref 4.0–10.5)
nRBC: 0 % (ref 0.0–0.2)

## 2020-05-26 LAB — GLUCOSE, CAPILLARY
Glucose-Capillary: 96 mg/dL (ref 70–99)
Glucose-Capillary: 88 mg/dL (ref 70–99)
Glucose-Capillary: 75 mg/dL (ref 70–99)
Glucose-Capillary: 111 mg/dL — ABNORMAL HIGH (ref 70–99)
Glucose-Capillary: 124 mg/dL — ABNORMAL HIGH (ref 70–99)

## 2020-05-26 MED ORDER — UMECLIDINIUM BROMIDE 62.5 MCG/INH IN AEPB
1.0000 | INHALATION_SPRAY | Freq: Every day | RESPIRATORY_TRACT | Status: DC
  Administered 2020-05-26 – 2020-05-27 (×2): 1 via RESPIRATORY_TRACT
  Filled 2020-05-26: qty 7

## 2020-05-26 MED ORDER — DOXERCALCIFEROL 4 MCG/2ML IV SOLN
INTRAVENOUS | Status: AC
  Filled 2020-05-26: qty 4

## 2020-05-26 MED ORDER — HEPARIN SODIUM (PORCINE) 1000 UNIT/ML IJ SOLN
INTRAMUSCULAR | Status: AC
  Administered 2020-05-26: 1000 [IU]
  Filled 2020-05-26: qty 10

## 2020-05-26 MED ORDER — DARBEPOETIN ALFA 200 MCG/0.4ML IJ SOSY
PREFILLED_SYRINGE | INTRAMUSCULAR | Status: AC
  Filled 2020-05-26: qty 0.4

## 2020-05-26 NOTE — Progress Notes (Signed)
Occupational Therapy Note  Patient Details  Name: Jonathon Bailey MRN: [id number] Date of Birth: 06-06-1956   Pt declined participation in OT this session secondary to nausea and not feeling well.  Nursing made aware.  He missed 45 mins of skilled OT.  Malcome Ambrocio OTR/L 05/26/2020, 10:29 AM

## 2020-05-26 NOTE — Progress Notes (Signed)
Subjective: Complains of dry cough, no shortness of breath no fever no chills, no chest pain dialysis today 3 L UF  Objective Vital signs in last 24 hours: Vitals:   05/25/20 0517 05/25/20 1438 05/25/20 2010 05/26/20 0534  BP: 133/79 (!) 143/89 126/76 (!) 149/88  Pulse: 85 80 76 83  Resp: 18 17 16 18   Temp: 98.2 F (36.8 C) 98.1 F (36.7 C) 98.6 F (37 C) 98.3 F (36.8 C)  TempSrc:  Oral Oral Oral  SpO2: 96%  97% 99%  Weight:      Height:       Weight change:   Physical Exam: General:Obese male, NAD Heart:RRR, no MRG Lungs:CTA, nonlabored breathing Abdomen:Obese, soft, bowel sounds normoactive, ND, NT Extremities:Left BKA no stump edema right lower extremity 1+ pedaledema Dialysis Access:Left IJ PermCath dressing dry clear nontender  Dialysis Orders: TTS [place] 4.5h 400/800 126kg 2/2.25 bath Hep 12000bolusTDC -Mircera14mcg q 2wks - just reordered/not yet given,lastdose1/26/21 -Hectorol32mcg IV qHD  Problem/Plan: 1.PAD w/L leg ischemia +gangrene - , s/p Vanc/Zosyn course. VVS consulted, b/l LE arteriogram 8/6 with severe b/l PAD below the knees, L BKA on 8/9 by Dr. Donnetta Hutching. Now in CIR.  2. ESRD- on HD TTS.Marland KitchenLast K 4.4 use 2K bath HD 3. HTN/volume- BPthis a.m. 49/88.hx chronically low BP.  Attempting UF 3 L HD today,Coreg reduced prior low blood pressure may need to readjust if remains elevated. needs lower dry weight at discharge, he states  can stand on scale, PT in room yesterday said practicing standingD 4. Anemiaof CKD-Hgb 8.2> 8.1 =8/ 31. S/p 2 units pRBC 8/25.  Increase aranesp 100 to 200 mcg q Thursday HD . tsat 6%, ferritin 218 - No IV iron d/t allergy. On PO iron BID.  5. Secondary hyperparathyroidism- CCa and phos at goal. Continue VDRA, binders and sensipar.   6. Nutrition-ALB 2.8 renal diet w/fluid restrictions.Onprotein supplement with ALB 2. 5 7.DMT2 8. CAD s/p CABG 9. Hx CVA 10. COPD  Ernest Haber,  PA-C [facility] [phone] 05/26/2020,9:44 AM  LOS: 20 days   Labs: Basic Metabolic Panel: Recent Labs  Lab 05/19/20 1322 05/21/20 1344 05/24/20 1353  NA 131* 133* 131*  K 3.6 3.9 4.4  CL 94* 96* 94*  CO2 24 24 24   GLUCOSE 100* 143* 111*  BUN 32* 31* 42*  CREATININE 8.59* 8.54* 9.74*  CALCIUM 7.9* 8.5* 8.4*  PHOS 3.9 4.3 4.7*   Liver Function Tests: Recent Labs  Lab 05/19/20 1322 05/21/20 1344 05/24/20 1353  ALBUMIN 2.3* 2.3* 2.5*   No results for input(s): LIPASE, AMYLASE in the last 168 hours. No results for input(s): AMMONIA in the last 168 hours. CBC: Recent Labs  Lab 05/19/20 1322 05/19/20 1322 05/20/20 0610 05/20/20 0610 05/21/20 0512 05/22/20 0506 05/24/20 1353  WBC 9.7   < > 8.3   < > 7.8 8.5 8.3  HGB 7.4*   < > 8.2*   < > 7.6* 8.2* 8.1*  HCT 25.9*   < > 28.8*   < > 26.6* 28.8* 28.5*  MCV 87.5  --  88.6  --  87.5 87.8 88.0  PLT 259   < > 293   < > 276 292 326   < > = values in this interval not displayed.   Cardiac Enzymes: No results for input(s): CKTOTAL, CKMB, CKMBINDEX, TROPONINI in the last 168 hours. CBG: Recent Labs  Lab 05/25/20 0630 05/25/20 1129 05/25/20 1656 05/25/20 2101 05/26/20 0606  GLUCAP 94 94 121* 162* 96  Studies/Results: No results found. Medications:  . (feeding supplement) PROSource Plus  30 mL Oral BID BM  . sodium chloride   Intravenous Once  . vitamin C  1,000 mg Oral BID  . aspirin  81 mg Oral QODAY  . atorvastatin  40 mg Oral QHS  . carvedilol  6.25 mg Oral BID WC  . cetaphil  1 application Topical Daily  . Chlorhexidine Gluconate Cloth  6 each Topical BID  . cinacalcet  90 mg Oral Q supper  . darbepoetin (ARANESP) injection - DIALYSIS  200 mcg Intravenous Q Thu-HD  . doxercalciferol  6 mcg Intravenous Q T,Th,Sa-HD  . ferrous gluconate  324 mg Oral BID WC  . heparin  5,000 Units Subcutaneous Q8H  . insulin aspart  0-15 Units Subcutaneous TID WC  . insulin glargine  20 Units  Subcutaneous QHS  . melatonin  5 mg Oral QHS  . multivitamin  1 tablet Oral QHS  . pantoprazole  40 mg Oral Daily  . polyethylene glycol  17 g Oral BID  . sevelamer carbonate  4.8 g Oral TID WC  . sorbitol  30 mL Oral Q breakfast

## 2020-05-26 NOTE — Progress Notes (Signed)
[place] PHYSICAL MEDICINE & REHABILITATION PROGRESS NOTE   Subjective/Complaints: No complaints. In dialysis  ROS: Patient denies fever, rash, sore throat, blurred vision, nausea, vomiting, diarrhea, cough, shortness of breath or chest pain,  headache, or mood change.    Objective:   No results found. Recent Labs    05/24/20 1353 05/26/20 1130  WBC 8.3 8.4  HGB 8.1* 8.2*  HCT 28.5* 29.3*  PLT 326 290   Recent Labs    05/24/20 1353 05/26/20 1130  NA 131* 133*  K 4.4 4.2  CL 94* 97*  CO2 24 24  GLUCOSE 111* 116*  BUN 42* 38*  CREATININE 9.74* 9.01*  CALCIUM 8.4* 8.5*    Intake/Output Summary (Last 24 hours) at 05/26/2020 1501 Last data filed at 05/26/2020 0817 Gross per 24 hour  Intake 538 ml  Output --  Net 538 ml     Physical Exam: Vital Signs Blood pressure 98/71, pulse 75, temperature 98.7 F (37.1 C), temperature source Oral, resp. rate 16, height 6' (1.829 m), weight 126.7 kg, SpO2 99 %. General: Alert and oriented x 3, No apparent distress HEENT: Head is normocephalic, atraumatic, PERRLA, EOMI, sclera anicteric, oral mucosa pink and moist, dentition intact, ext ear canals clear,  Neck: Supple without JVD or lymphadenopathy Heart: Reg rate and rhythm. No murmurs rubs or gallops Chest: CTA bilaterally without wheezes, rales, or rhonchi; no distress Abdomen: Soft, non-tender, non-distended, bowel sounds positive. Extremities: No clubbing, cyanosis, or edema. Pulses are 2+ Skin: Left BKA with dressing CDI, IV right arm Psych: Normal mood.  Normal behavior. Musc: Left BKA with improving edema and tenderness. Wearing shrinker Neuro: Alert Motor: Bilateral upper extremities: 5/5 proximal distal Left lower extremity: 4+/5 proximal distally, unchanged Right lower extremity: Hip flexion 4-4+/5 (pain inhibition)  Psych: Pt's affect is appropriate. Pt is cooperative    Assessment/Plan: 1. Functional deficits secondary to left BKA which require 3+ hours per  day of interdisciplinary therapy in a comprehensive inpatient rehab setting.  Physiatrist is providing close team supervision and 24 hour management of active medical problems listed below.  Physiatrist and rehab team continue to assess barriers to discharge/monitor patient progress toward functional and medical goals  Care Tool:  Bathing  Bathing activity did not occur: Refused Body parts bathed by patient: Left arm, Right arm, Chest, Abdomen, Front perineal area, Buttocks, Right upper leg, Left upper leg, Face   Body parts bathed by helper: Right lower leg Body parts n/a: Left lower leg   Bathing assist Assist Level: Supervision/Verbal cueing     Upper Body Dressing/Undressing Upper body dressing   What is the patient wearing?: Pull over shirt    Upper body assist Assist Level: Set up assist    Lower Body Dressing/Undressing Lower body dressing      What is the patient wearing?: Underwear/pull up, Pants     Lower body assist Assist for lower body dressing: Contact Guard/Touching assist     Toileting Toileting Toileting Activity did not occur (Clothing management and hygiene only): N/A (no void or bm)  Toileting assist Assist for toileting: Contact Guard/Touching assist     Transfers Chair/bed transfer  Transfers assist     Chair/bed transfer assist level: Supervision/Verbal cueing (lateral scoot)     Locomotion Ambulation   Ambulation assist   Ambulation activity did not occur: Safety/medical concerns  Assist level: 2 helpers Assistive device: Parallel bars Max distance: 69ft   Walk 10 feet activity   Assist  Walk 10 feet activity did not  occur: Safety/medical concerns (L BKA, decreased balance/postural control, generalized weakness)        Walk 50 feet activity   Assist Walk 50 feet with 2 turns activity did not occur: Safety/medical concerns (L BKA, decreased balance/postural control, generalized weakness)         Walk 150 feet  activity   Assist Walk 150 feet activity did not occur: Safety/medical concerns (L BKA, decreased balance/postural control, generalized weakness)         Walk 10 feet on uneven surface  activity   Assist Walk 10 feet on uneven surfaces activity did not occur: Safety/medical concerns (L BKA, decreased balance/postural control, generalized weakness)         Wheelchair     Assist Will patient use wheelchair at discharge?: Yes Type of Wheelchair: Manual    Wheelchair assist level: Supervision/Verbal cueing Max wheelchair distance: 119ft    Wheelchair 50 feet with 2 turns activity    Assist        Assist Level: Supervision/Verbal cueing   Wheelchair 150 feet activity     Assist      Assist Level: Supervision/Verbal cueing   Blood pressure 98/71, pulse 75, temperature 98.7 F (37.1 C), temperature source Oral, resp. rate 16, height 6' (1.829 m), weight 126.7 kg, SpO2 99 %.    Medical Problem List and Plan: 1.  Limitations with mobility, transfers, endurance, self-care secondary to left BKA.  Continue CIR  Continue stump shrinker  2.  Antithrombotics: -DVT/anticoagulation:  Pharmaceutical: Heparin             -antiplatelet therapy: ASA daily  3. Pain Management: Oxycodone as needed   Controlled 9/2  . 4. Mood: Team support             -antipsychotic agents: N/a. Positive mood, joking 5. Neuropsych: This patient is capable of making decisions on his own behalf.  Discussed with Neuropsych, appreciate recs 6. Skin/Wound Care: Daily dressing changes. Monitor wound for healing.  Vitamin C for healing.  Prosource added to promote wound healing.  7. Fluids/Electrolytes/Nutrition: Monitor I/O.  Modified/renal diet with 1200 ccFR/day. Labs monitored with HD.  Patient still upset about diet as heart healthy, CM, renal diet recommended.  8.  T2DM with hyperglycemia:  Hgb A1c- 7.9.  Continue Lantus 20 units daily (On levemir 75 mg bid/Novolog 5 untis bid PTA-  reports eats better at home). Continue to monitor BS ac/hs and use SSI for elevated blood sugars. CBG (last 3)  Recent Labs    05/26/20 0606 05/26/20 1136 05/26/20 1252  GLUCAP 96 88 75   Well controlled 9/2 9.  ESRD: Schedule hemodialysis at the end of the day on TTS to help with tolerance of activity.  Metabolic bone disease managed with Hectorol and Renvela. 10.  COPD: Respiratory status stable on Incruse daily. With congestion- CXR shows atelectasis. Encourage use of IS. Breathing comfortably.  11.  Anemia of chronic disease: Serial CBCs with hemodialysis.  On IV Aranesp weekly with Wynonia Lawman twice daily.  Hemoglobin 8.2 on 8/27, Hgb 8.2 on 8/29, hgb 8.1 on 8/31  Continue to monitor 12.  History of CAD: Carvedilol decreased due to hypotension --continue Coreg 3.125 mg bid with Lipitor and ASA. 13.  Hypertension  BP still elevated  8/30 increase coreg to 6.25mg  bid  8/2: soft due to dialysis   Vitals:   05/26/20 1400 05/26/20 1430  BP: 133/71 98/71  Pulse: 83 75  Resp:    Temp:    SpO2:  14.  Leukocytosis: Resolved  Afebrile  Continue to monitor 15.  Drug-induced constipation  Abdominal x-ray unremarkable  Bowel meds increased on 8/18  Some improvement  Patient noncompliant with bowel meds 16. GERD PPI 40mg  qd  LOS: 20 days A FACE TO FACE EVALUATION WAS PERFORMED  Ekam Bonebrake P Kahlan Engebretson 05/26/2020, 3:01 PM

## 2020-05-26 NOTE — Progress Notes (Signed)
Patient ID: Jonathon Bailey, male   DOB: May 12, 1956, 64 y.o.   MRN: [id number]  Have finally gotten pt's insurance straight and he is able to transfer to [employer]. Pam-PA to order COVID test and he will have HD today. Plan to transfer in early am.

## 2020-05-26 NOTE — Progress Notes (Signed)
Physical Therapy Session Note  Patient Details  Name: Jonathon Bailey MRN: [id number] Date of Birth: Nov 16, 1955  Today's Date: 05/26/2020 PT Individual Time: 4098-1191 and 1132-1200  PT Individual Time Calculation (min): 55 min and 28 min  Short Term Goals: Week 2:  PT Short Term Goal 1 (Week 2): Pt will transfer bed<>chair with LRAD CGA PT Short Term Goal 1 - Progress (Week 2): Met PT Short Term Goal 2 (Week 2): Pt will transfer sit<>stand with LRAD CGA PT Short Term Goal 2 - Progress (Week 2): Progressing toward goal PT Short Term Goal 3 (Week 2): Pt will initiate gait training PT Short Term Goal 3 - Progress (Week 2): Met Week 3:  PT Short Term Goal 1 (Week 3): Pt will transfer stand<>pivot with LRAD CGA PT Short Term Goal 2 (Week 3): Pt will transfer sit<>stand with LRAD CGA consistantly PT Short Term Goal 3 (Week 3): Pt will ambulate 53f with LRAD min A of 1  Skilled Therapeutic Interventions/Progress Updates:   Treatment Session 1: [id number] min Received pt sitting in recliner with RN present wrapping residual limb, pt agreeable to therapy, and reported pain in residual limb but stated it was a low number and declined pain interventions. Session with emphasis on functional mobility/transfers, generalized strengthening, dynamic standing balance/coordination, and improved activity tolerance. Pt transferred recliner<>WC via lateral scoot with supervision and therapist stabilizing chairs. Donned WC gloves and performed WC mobility 770fusing bilateral UEs mod I and [facility] due to UE fatigue. Pt transferred sit<>stand in // bars with min A and performed 2x5 "hopping" in place on R LE. Pt with decreased foot clearance and requested to stop due to discomfort on R foot (don shoe next time). WC<>mat via lateral scoot with supervision x 2 trials and pt transferred sit<>stand with RW and supervision x 2 trials and worked on dynamic standing balance  clipping/unclipping clothespins to basketball net with CGA/supervision x 2 trials. Worked on dynamic sitting balance and trunk control batting ball with 3lb dowel 2x20 with supervision. Pt reported increased pain in R UE after activities and required extensive rest break. Pt performed the following exercises sitting on mat with supervision and verbal cues for technique: -hip flexion 2x12 bilaterally with 3lb ankle weight on R LE -knee extension 2x12 bilaterally with 3lb ankle weight on R LE Pt [facility] and transferred WC<>recliner lateral scoot with supervision. Concluded session with pt sitting in recliner, needs within reach, and chair pad alarm on.  Treatment Session 2: 1132-1200 28 min Received pt sitting in recliner with NT present checking glucose levels, pt stated "I'm not doing anything today" and reported feeling ill from the food he ate for breakfast. Pt agreeable to play card games sitting down after convincing from therapist. Engaged in dynamic sitting balance, trunk control, problem solving, sequencing, reaction time, and attention to task playing multiple card games (war, go fish, slap jack). Concluded session with pt sitting in recliner, needs within reach, and chair pad alarm on.  Therapy Documentation Precautions:  Precautions Precautions: Fall Precaution Comments: recent L BKA Restrictions Weight Bearing Restrictions: Yes LLE Weight Bearing: Non weight bearing   Therapy/Group: Individual Therapy AnAlfonse AlpersT, DPT   05/26/2020, 7:30 AM

## 2020-05-27 DIAGNOSIS — E440 Moderate protein-calorie malnutrition: Secondary | ICD-10-CM | POA: Insufficient documentation

## 2020-05-27 LAB — SARS CORONAVIRUS 2 (TAT 6-24 HRS): SARS Coronavirus 2: NEGATIVE

## 2020-05-27 LAB — GLUCOSE, CAPILLARY: Glucose-Capillary: 109 mg/dL — ABNORMAL HIGH (ref 70–99)

## 2020-05-27 MED ORDER — FERROUS GLUCONATE 324 (38 FE) MG PO TABS: 324.0000 mg | ORAL_TABLET | Freq: Two times a day (BID) | ORAL | 3 refills | Status: DC

## 2020-05-27 MED ORDER — OXYCODONE HCL 5 MG PO TABS
5.0000 mg | ORAL_TABLET | Freq: Every day | ORAL | 0 refills | Status: DC | PRN
Start: 2020-05-27 — End: 2021-09-15

## 2020-05-27 MED ORDER — ASCORBIC ACID 1000 MG PO TABS: 1000.0000 mg | ORAL_TABLET | Freq: Two times a day (BID) | ORAL | Status: DC

## 2020-05-27 MED ORDER — SEVELAMER CARBONATE 2.4 G PO PACK: 4.8000 g | PACK | Freq: Three times a day (TID) | ORAL | Status: DC

## 2020-05-27 MED ORDER — CETAPHIL MOISTURIZING EX LOTN: 1.0000 "application " | TOPICAL_LOTION | Freq: Every day | CUTANEOUS | 0 refills | Status: DC

## 2020-05-27 MED ORDER — ALUMINUM HYDROXIDE GEL 320 MG/5ML PO SUSP: 10.0000 mL | ORAL | 0 refills | Status: DC | PRN

## 2020-05-27 MED ORDER — PROSOURCE PLUS PO LIQD: 30.0000 mL | Freq: Two times a day (BID) | ORAL | Status: DC

## 2020-05-27 MED ORDER — DIPHENHYDRAMINE HCL 12.5 MG/5ML PO ELIX
12.5000 mg | ORAL_SOLUTION | Freq: Four times a day (QID) | ORAL | 0 refills | Status: DC | PRN
Start: 2020-05-27 — End: 2021-09-15

## 2020-05-27 MED ORDER — MELATONIN 5 MG PO TABS: 5.0000 mg | ORAL_TABLET | Freq: Every day | ORAL | 0 refills | Status: DC

## 2020-05-27 MED ORDER — UMECLIDINIUM BROMIDE 62.5 MCG/INH IN AEPB: 1.0000 | INHALATION_SPRAY | Freq: Every day | RESPIRATORY_TRACT | Status: DC

## 2020-05-27 MED ORDER — CARVEDILOL 6.25 MG PO TABS: 6.2500 mg | ORAL_TABLET | Freq: Two times a day (BID) | ORAL | Status: DC

## 2020-05-27 NOTE — Progress Notes (Signed)
Inpatient Rehabilitation Care Coordinator  Discharge Note  The overall goal for the admission was met for:   Discharge location: No-[facility]-SNF  Length of Stay: No-21 DAYS  Discharge activity level: Yes-MIN LEVEL  Home/community participation: Yes  Services provided included: MD, RD, PT, OT, RN, CM, Pharmacy, Neuropsych and SW  Financial Services: Medicare and Private Insurance: [facility]   Follow-up services arranged: Other: SNF-[facility]  Comments (or additional information):PT NEEDS MORE REHAB BEFORE GOING BACK HOME WITH SON  Patient/Family verbalized understanding of follow-up arrangements: Yes  Individual responsible for coordination of the follow-up plan: SELF [phone]  Confirmed correct DME delivered: Jonathon Bailey, Jonathon Bailey 05/27/2020    Jonathon Bailey, Jonathon Bailey 

## 2020-05-27 NOTE — Progress Notes (Signed)
Subjective:    For DC today to SNF  HD yesterday: 3L UF  NAE, no c/o this AM  Objective Vital signs in last 24 hours: Vitals:   05/26/20 1736 05/26/20 2115 05/27/20 0501 05/27/20 0824  BP: (!) 156/98 134/82 122/74   Pulse: 88 85 81 68  Resp: 16 16 16 18   Temp: 98.5 F (36.9 C) 98.9 F (37.2 C) 98.8 F (37.1 C)   TempSrc:      SpO2: 96% 98% 99% 92%  Weight:      Height:       Weight change:   Physical Exam: General:Obese male, NAD in chair Heart:RRR, no MRG Lungs:CTA, nonlabored breathing Abdomen:Obese, soft, bowel sounds normoactive, ND, NT Extremities:Left BKA no stump edema right lower extremity 1+ pedaledema Dialysis Access:Left IJ PermCath dressing dry clear nontender  Dialysis Orders: TTS [facility] 4.5h 400/800 126kg 2/2.25 bath Hep 12000bolusTDC -Mircera35mcg q 2wks - just reordered/not yet given,lastdose1/26/21 -Hectorol31mcg IV qHD  Problem/Plan: 1.PAD w/L leg ischemia +gangrene - , s/p Vanc/Zosyn course. [facility] consulted, b/l LE arteriogram 8/6 with severe b/l PAD below the knees, L BKA on 8/9 by Dr. Donnetta Hutching. Now in CIR.  2. ESRD- on HD TTS on schedule.Next HD at HD unit tomorrow. 3. HTN/volume- Stable, tol UF.Coreg reduced prior low blood pressure may need to readjust if remains elevated. needs lower dry weight at discharge,  4. Anemiaof CKD-Hgb stable in 8s. S/p 2 units pRBC 8/25.  Increased aranesp 100 to 200 mcg q Thursday HD . tsat 6%, ferritin 218 - No IV iron d/t allergy. On PO iron BID.  5. Secondary hyperparathyroidism- CCa and phos at goal. Continue VDRA, binders and sensipar.   6. Nutrition-ALB 2.8 renal diet w/fluid restrictions.Onprotein supplement with ALB 2. 5 7.DMT2 8. CAD s/p CABG 9. Hx CVA 10. COPD  [facility] 05/27/2020,9:33 AM  LOS: 21 days   Labs: Basic Metabolic Panel: Recent Labs  Lab 05/21/20 1344 05/24/20 1353 05/26/20 1130  NA 133* 131* 133*  K 3.9  4.4 4.2  CL 96* 94* 97*  CO2 24 24 24   GLUCOSE 143* 111* 116*  BUN 31* 42* 38*  CREATININE 8.54* 9.74* 9.01*  CALCIUM 8.5* 8.4* 8.5*  PHOS 4.3 4.7* 4.9*   Liver Function Tests: Recent Labs  Lab 05/21/20 1344 05/24/20 1353 05/26/20 1130  ALBUMIN 2.3* 2.5* 2.6*   No results for input(s): LIPASE, AMYLASE in the last 168 hours. No results for input(s): AMMONIA in the last 168 hours. CBC: Recent Labs  Lab 05/21/20 0512 05/21/20 0512 05/22/20 0506 05/24/20 1353 05/26/20 1130  WBC 7.8   < > 8.5 8.3 8.4  HGB 7.6*   < > 8.2* 8.1* 8.2*  HCT 26.6*   < > 28.8* 28.5* 29.3*  MCV 87.5  --  87.8 88.0 87.7  PLT 276   < > 292 326 290   < > = values in this interval not displayed.   Cardiac Enzymes: No results for input(s): CKTOTAL, CKMB, CKMBINDEX, TROPONINI in the last 168 hours. CBG: Recent Labs  Lab 05/26/20 1136 05/26/20 1252 05/26/20 1738 05/26/20 2117 05/27/20 0548  GLUCAP 88 75 111* 124* 109*    Studies/Results: No results found. Medications:  . (feeding supplement) PROSource Plus  30 mL Oral BID BM  . sodium chloride   Intravenous Once  . vitamin C  1,000 mg Oral BID  . aspirin  81 mg Oral QODAY  . atorvastatin  40 mg Oral QHS  . carvedilol  6.25 mg Oral BID WC  . cetaphil  1 application Topical Daily  . Chlorhexidine Gluconate Cloth  6 each Topical BID  . cinacalcet  90 mg Oral Q supper  . darbepoetin (ARANESP) injection - DIALYSIS  200 mcg Intravenous Q Thu-HD  . doxercalciferol  6 mcg Intravenous Q T,Th,Sa-HD  . ferrous gluconate  324 mg Oral BID WC  . heparin  5,000 Units Subcutaneous Q8H  . insulin aspart  0-15 Units Subcutaneous TID WC  . insulin glargine  20 Units Subcutaneous QHS  . melatonin  5 mg Oral QHS  . multivitamin  1 tablet Oral QHS  . pantoprazole  40 mg Oral Daily  . polyethylene glycol  17 g Oral BID  . sevelamer carbonate  4.8 g Oral TID WC  . sorbitol  30 mL Oral Q breakfast  . umeclidinium bromide  1 puff Inhalation Daily

## 2020-05-27 NOTE — Progress Notes (Signed)
[facility] PHYSICAL MEDICINE & REHABILITATION PROGRESS NOTE   Subjective/Complaints: No complaints this morning. Left residual limb with bruising more proximally. When asked, patient reports that at times shrinker feels tight. Advised him to let his nurse know so less dressings can be applied underneath to decrease tightness  ROS: Patient denies fever, rash, sore throat, blurred vision, nausea, vomiting, diarrhea, cough, shortness of breath or chest pain,  headache, or mood change.    Objective:   No results found. Recent Labs    05/24/20 1353 05/26/20 1130  WBC 8.3 8.4  HGB 8.1* 8.2*  HCT 28.5* 29.3*  PLT 326 290   Recent Labs    05/24/20 1353 05/26/20 1130  NA 131* 133*  K 4.4 4.2  CL 94* 97*  CO2 24 24  GLUCOSE 111* 116*  BUN 42* 38*  CREATININE 9.74* 9.01*  CALCIUM 8.4* 8.5*    Intake/Output Summary (Last 24 hours) at 05/27/2020 1019 Last data filed at 05/27/2020 0817 Gross per 24 hour  Intake 590 ml  Output 3001 ml  Net -2411 ml     Physical Exam: Vital Signs Blood pressure 122/74, pulse 68, temperature 98.8 F (37.1 C), resp. rate 18, height 6' (1.829 m), weight 123.7 kg, SpO2 92 %. General: Alert and oriented x 3, No apparent distress HEENT: Head is normocephalic, atraumatic, PERRLA, EOMI, sclera anicteric, oral mucosa pink and moist, dentition intact, ext ear canals clear,  Neck: Supple without JVD or lymphadenopathy Heart: Reg rate and rhythm. No murmurs rubs or gallops Chest: CTA bilaterally without wheezes, rales, or rhonchi; no distress Abdomen: Soft, non-tender, non-distended, bowel sounds positive. Extremities: No clubbing, cyanosis, or edema. Pulses are 2+ Skin: Left BKA with dressing CDI, IV right arm Psych: Normal mood.  Normal behavior. Musc: Left BKA with improving edema and tenderness. Wearing shrinker Neuro: Alert Motor: Bilateral upper extremities: 5/5 proximal distal Left lower extremity: 4+/5 proximal distally, unchanged Right lower  extremity: Hip flexion 4-4+/5 (pain inhibition)  Psych: Pt's affect is appropriate. Pt is cooperative     Assessment/Plan: 1. Functional deficits secondary to left BKA which require 3+ hours per day of interdisciplinary therapy in a comprehensive inpatient rehab setting.  Physiatrist is providing close team supervision and 24 hour management of active medical problems listed below.  Physiatrist and rehab team continue to assess barriers to discharge/monitor patient progress toward functional and medical goals  Care Tool:  Bathing  Bathing activity did not occur: Refused Body parts bathed by patient: Left arm, Right arm, Chest, Abdomen, Front perineal area, Buttocks, Right upper leg, Left upper leg, Face   Body parts bathed by helper: Right lower leg Body parts n/a: Left lower leg   Bathing assist Assist Level: Supervision/Verbal cueing     Upper Body Dressing/Undressing Upper body dressing   What is the patient wearing?: Pull over shirt    Upper body assist Assist Level: Set up assist    Lower Body Dressing/Undressing Lower body dressing      What is the patient wearing?: Underwear/pull up, Pants     Lower body assist Assist for lower body dressing: Contact Guard/Touching assist     Toileting Toileting Toileting Activity did not occur (Clothing management and hygiene only): N/A (no void or bm)  Toileting assist Assist for toileting: Contact Guard/Touching assist     Transfers Chair/bed transfer  Transfers assist     Chair/bed transfer assist level: Supervision/Verbal cueing (lateral scoot)     Locomotion Ambulation   Ambulation assist   Ambulation activity  did not occur: Safety/medical concerns  Assist level: 2 helpers Assistive device: Parallel bars Max distance: 86ft   Walk 10 feet activity   Assist  Walk 10 feet activity did not occur: Safety/medical concerns (L BKA, decreased balance/postural control, generalized weakness)        Walk 50  feet activity   Assist Walk 50 feet with 2 turns activity did not occur: Safety/medical concerns (L BKA, decreased balance/postural control, generalized weakness)         Walk 150 feet activity   Assist Walk 150 feet activity did not occur: Safety/medical concerns (L BKA, decreased balance/postural control, generalized weakness)         Walk 10 feet on uneven surface  activity   Assist Walk 10 feet on uneven surfaces activity did not occur: Safety/medical concerns (L BKA, decreased balance/postural control, generalized weakness)         Wheelchair     Assist Will patient use wheelchair at discharge?: Yes Type of Wheelchair: Manual    Wheelchair assist level: Supervision/Verbal cueing Max wheelchair distance: 18ft    Wheelchair 50 feet with 2 turns activity    Assist        Assist Level: Supervision/Verbal cueing   Wheelchair 150 feet activity     Assist      Assist Level: Supervision/Verbal cueing   Blood pressure 122/74, pulse 68, temperature 98.8 F (37.1 C), resp. rate 18, height 6' (1.829 m), weight 123.7 kg, SpO2 92 %.    Medical Problem List and Plan: 1.  Limitations with mobility, transfers, endurance, self-care secondary to left BKA.  DC to SNF  Continue stump shrinker  2.  Antithrombotics: -DVT/anticoagulation:  Pharmaceutical: Heparin             -antiplatelet therapy: ASA daily  3. Pain Management: Oxycodone as needed   Controlled 9/3  . 4. Mood: Team support             -antipsychotic agents: N/a. Positive mood, joking 5. Neuropsych: This patient is capable of making decisions on his own behalf.  Discussed with Neuropsych, appreciate recs 6. Skin/Wound Care: Daily dressing changes. Monitor wound for healing.  Vitamin C for healing.  Prosource added to promote wound healing. Some bruising more proximally. Advised letting nurse know if shrinker is too tight so less dressings can be applied underneath 7.  Fluids/Electrolytes/Nutrition: Monitor I/O.  Modified/renal diet with 1200 ccFR/day. Labs monitored with HD.  Patient still upset about diet as heart healthy, CM, renal diet recommended.  8.  T2DM with hyperglycemia:  Hgb A1c- 7.9.  Continue Lantus 20 units daily (On levemir 75 mg bid/Novolog 5 untis bid PTA- reports eats better at home). Continue to monitor BS ac/hs and use SSI for elevated blood sugars. CBG (last 3)  Recent Labs    05/26/20 1738 05/26/20 2117 05/27/20 0548  GLUCAP 111* 124* 109*   Well controlled 9/3 9.  ESRD: Schedule hemodialysis at the end of the day on TTS to help with tolerance of activity.  Metabolic bone disease managed with Hectorol and Renvela. 10.  COPD: Respiratory status stable on Incruse daily. With congestion- CXR shows atelectasis. Encourage use of IS. Breathing comfortably.  11.  Anemia of chronic disease: Serial CBCs with hemodialysis.  On IV Aranesp weekly with Wynonia Lawman twice daily.  Hemoglobin 8.2 on 8/27, Hgb 8.2 on 8/29, hgb 8.1 on 8/31  Continue to monitor 12.  History of CAD: Carvedilol decreased due to hypotension --continue Coreg 3.125 mg bid with Lipitor and ASA.  13.  Hypertension  BP still elevated  8/30 increase coreg to 6.25mg  bid  8/2: soft due to dialysis   Vitals:   05/27/20 0501 05/27/20 0824  BP: 122/74   Pulse: 81 68  Resp: 16 18  Temp: 98.8 F (37.1 C)   SpO2: 99% 92%   14.  Leukocytosis: Resolved  Afebrile  Continue to monitor 15.  Drug-induced constipation  Abdominal x-ray unremarkable  Bowel meds increased on 8/18  Some improvement  Patient noncompliant with bowel meds 16. GERD PPI 40mg  qd  LOS: 21 days A FACE TO FACE EVALUATION WAS PERFORMED  Martha Clan P Kenadie Royce 05/27/2020, 10:19 AM

## 2020-05-27 NOTE — Progress Notes (Signed)
Pt is awake alert and oriented this morning with no complaints of pain. Thsi nruse performed daily dressing change with Raulkar, MD viewing the moisture related gray/soggy skin around the wound closure. MD voiced she believes it is healing properly and advised the the pt to inform nursing staff at next facility to assure wrap is not too tight. Pt was able to wash up and pack belongings; pt is awaiting transportation. This nurse called report to [facility]

## 2020-05-27 NOTE — Discharge Instructions (Signed)
Inpatient Rehab Discharge Instructions  Jonathon Bailey Discharge date and time:  05/27/20  Activities/Precautions/ Functional Status: Activity: no lifting, driving, or strenuous exercise till cleared by MD Diet: diabetic diet and renal diet--1200 fluids/day Wound Care: keep wound clean and dry. Contact Dr. Donnetta Hutching if you develop any problems with your incision/wound--redness, swelling, increase in pain, drainage or if you develop fever or chills.    Functional status:  ___ No restrictions     ___ Walk up steps independently _X 24/7 supervision/assistance   ___ Walk up steps with assistance ___ Intermittent supervision/assistance  ___ Bathe/dress independently ___ Walk with walker     ___ Bathe/dress with assistance ___ Walk Independently    ___ Shower independently ___ Walk with assistance    ___ Shower with assistance _X__ No alcohol     ___ Return to work/school ________  Special Instructions: 1. Monitor BS before meals and at bedtime. Use SSI per protocol to keep BS <120.   My questions have been answered and I understand these instructions. I will adhere to these goals and the provided educational materials after my discharge from the hospital.  Patient/Caregiver Signature _______________________________ Date __________  Clinician Signature _______________________________________ Date __________  Please bring this form and your medication list with you to all your follow-up doctor's appointments.

## 2020-05-28 ENCOUNTER — Emergency Department (HOSPITAL_COMMUNITY)
Admission: EM | Admit: 2020-05-28 | Discharge: 2020-05-28 | Disposition: A | Discharge status: Home / Self Care | Payer: No Typology Code available for payment source | Attending: Emergency Medicine | Admitting: Emergency Medicine

## 2020-05-28 ENCOUNTER — Other Ambulatory Visit: Payer: Self-pay

## 2020-05-28 ENCOUNTER — Encounter (HOSPITAL_COMMUNITY): Payer: Self-pay | Admitting: Emergency Medicine

## 2020-05-28 DIAGNOSIS — Z751 Person awaiting admission to adequate facility elsewhere: Secondary | ICD-10-CM | POA: Insufficient documentation

## 2020-05-28 DIAGNOSIS — Z79899 Other long term (current) drug therapy: Secondary | ICD-10-CM | POA: Diagnosis not present

## 2020-05-28 DIAGNOSIS — Z7982 Long term (current) use of aspirin: Secondary | ICD-10-CM | POA: Insufficient documentation

## 2020-05-28 DIAGNOSIS — M79605 Pain in left leg: Secondary | ICD-10-CM | POA: Insufficient documentation

## 2020-05-28 DIAGNOSIS — Z87891 Personal history of nicotine dependence: Secondary | ICD-10-CM | POA: Insufficient documentation

## 2020-05-28 DIAGNOSIS — Z955 Presence of coronary angioplasty implant and graft: Secondary | ICD-10-CM | POA: Diagnosis not present

## 2020-05-28 DIAGNOSIS — J45909 Unspecified asthma, uncomplicated: Secondary | ICD-10-CM | POA: Diagnosis not present

## 2020-05-28 DIAGNOSIS — J449 Chronic obstructive pulmonary disease, unspecified: Secondary | ICD-10-CM | POA: Diagnosis not present

## 2020-05-28 DIAGNOSIS — I129 Hypertensive chronic kidney disease with stage 1 through stage 4 chronic kidney disease, or unspecified chronic kidney disease: Secondary | ICD-10-CM | POA: Diagnosis not present

## 2020-05-28 DIAGNOSIS — N184 Chronic kidney disease, stage 4 (severe): Secondary | ICD-10-CM | POA: Diagnosis not present

## 2020-05-28 DIAGNOSIS — Z20822 Contact with and (suspected) exposure to covid-19: Secondary | ICD-10-CM | POA: Insufficient documentation

## 2020-05-28 DIAGNOSIS — E119 Type 2 diabetes mellitus without complications: Secondary | ICD-10-CM | POA: Insufficient documentation

## 2020-05-28 DIAGNOSIS — Z794 Long term (current) use of insulin: Secondary | ICD-10-CM | POA: Insufficient documentation

## 2020-05-28 LAB — SARS CORONAVIRUS 2 BY RT PCR (HOSPITAL ORDER, PERFORMED IN ~~LOC~~ HOSPITAL LAB): SARS Coronavirus 2: NEGATIVE

## 2020-05-28 MED ORDER — HYDROCODONE-ACETAMINOPHEN 5-325 MG PO TABS
1.0000 | ORAL_TABLET | Freq: Once | ORAL | Status: AC
  Administered 2020-05-28: 1 via ORAL
  Filled 2020-05-28: qty 1

## 2020-05-28 NOTE — ED Notes (Signed)
Ptar called by Jaythen Hamme 

## 2020-05-28 NOTE — ED Triage Notes (Signed)
Received call from Dr. Jonnie Finner that pt was recently discharged from hospital to [facility].  Pt was upset there and left AMA.  Went to dialysis this morning and realized he is unable to care for himself.  Dr. Jonnie Finner spoke with [facility] that will take pt back but needs our social worker to call their social worker.  Pt reports pain to left stump since yesterday.  Colletta Maryland, case manager notified to look at note from Dr. Jonnie Finner.

## 2020-05-28 NOTE — ED Provider Notes (Signed)
[facility] EMERGENCY DEPARTMENT Provider Note   CSN: [id number] Arrival date & time: 05/28/20  1217     History Chief Complaint  Patient presents with  . Leg Pain  . SNF placement    Jonathon Bailey is a 64 y.o. male past history of asthma, CAD, CKD, ESRD who presents for evaluation of left lower extremity pain and needing SNF placement.  Patient with recent left BKA.  He was discharged from the hospital to [facility] care.  Patient got upset and left AMA.  He went dialysis this morning and realized that he could not take care of himself and was sent to the ED for SNF placement.  He states he has also been having pain in his left stump area.  He states that he has not followed up with vascular yet.  His next appointment is the ninth.  He states that the pain comes and goes.  He has not had any trauma, injury, fall.  He has not noticed any overlying warmth, erythema.  He denies any fevers, chest pain, difficulty breathing.  The history is provided by the patient.       Past Medical History:  Diagnosis Date  . Anxiety   . Arthritis   . Asthma    "as a child"  . CAD (coronary artery disease)   . Chronic systolic heart failure ([facility]) 09/21/2008   ECHO Feb 2013 showed LVEF low normal at 50-55%, +hypokinetic anterolateral wall and inferolateral wall.    . CKD (chronic kidney disease) stage 4, GFR 15-29 ml/min (HCC) 08/30/2009   Progressive renal failure since 2008, creatinine 1.2 in 2008 up to 3.5 in 2012 and 3.2-5.0 in 2013. All UA's 2011-13 showed >300 protein on dipstick. Work-up in May 2011 showed negative Urine IFE and SPEP, ultrasound showed 12-13 cm kidneys with increased echogenicity and UPC ratio was 1.5 gm proteinuria.  Hgb A1C's from 2011 to 2013 were all between 9-11.  Patient saw Dr. Donnetta Hutching (vasc surgery) for HD access in Aug 2013 > vein mapping was done and Dr. Donnetta Hutching felt the left arm (pt is R handed) was suitable for L arm Cimino radiocephalic fistula.  Patient said he wasn't ready to consider doing dialysis and declined the surgery.     . Depression   . Diabetic retinopathy ([place])   . ESRD (end stage renal disease) on dialysis ([place])   . QIONGEXB(284.1)    "maybe monthly" (03/15/2014)  . Hyperlipidemia   . Hypertension   . Macular edema   . Myocardial infarction [facility])    status post MI x2 and 3 stents placed in 2003  . Obesity   . Sleep apnea    does not use CPAP  . Stroke [facility]) ~ 2007; ~1987   "weak on right side; messed w/right side of brain; cry all the time"  . Type II diabetes mellitus (HCC)    insulin dependent    Patient Active Problem List   Diagnosis Date Noted  . ESRD on dialysis ([place])   . Constipation   . Noncompliance   . Abdominal distension   . Drug induced constipation   . Leukocytosis   . Essential hypertension   . Labile blood pressure   . Controlled type 2 diabetes mellitus with hyperglycemia, with long-term current use of insulin ([place])   . Unilateral complete BKA, left, initial encounter ([place]) 05/06/2020  . Postoperative pain   . Acute on chronic anemia   . Anemia of chronic disease   .  S/P BKA (below knee amputation) unilateral, left ([place])   . Sepsis without septic shock ([place]) 04/28/2020  . Wet gangrene ([place]) 04/28/2020  . Diabetic retinopathy ([place]) 08/21/2016  . Other specified fever   . Generalized abdominal pain   . Acute lower GI bleeding   . ESRD (end stage renal disease) ([place])   . Bacterial peritonitis ([place])   . Blood poisoning   . ESRD on peritoneal dialysis ([place])   . Acute blood loss anemia   . Abdominal pain 09/01/2014  . CAPD (continuous ambulatory peritoneal dialysis) status 03/15/2014  . Morbid obesity ([place]) 02/01/2014  . 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 Preventative health care 12/02/2013  . Other complications due to renal dialysis device, implant, and graft 10/24/2012  . COPD (chronic obstructive pulmonary disease)  ([place]) 10/23/2012  . DIABETIC PERIPHERAL NEUROPATHY 01/09/2010  . ESRD (end stage renal disease) on dialysis ([place]) 08/30/2009  . CEREBROVASCULAR ACCIDENT, HX OF 08/30/2009  . Diabetes mellitus type 2 with complications ([place]) 18/29/9371  . HYPERLIPIDEMIA 09/21/2008  . OBSTRUCTIVE SLEEP APNEA 09/21/2008  . ESSENTIAL HYPERTENSION 09/21/2008  . Coronary atherosclerosis 09/21/2008  . Chronic combined systolic and diastolic heart failure ([place]) 09/21/2008    Past Surgical History:  Procedure Laterality Date  . ABDOMINAL AORTOGRAM W/LOWER EXTREMITY N/A 04/29/2020   Procedure: ABDOMINAL AORTOGRAM W/LOWER EXTREMITY;  Surgeon: Angelia Mould, MD;  Location: [facility];  Service: Cardiovascular;  Laterality: N/A;  . AMPUTATION Left 05/02/2020   Procedure: AMPUTATION BELOW KNEE;  Surgeon: Rosetta Posner, MD;  Location: [facility];  Service: Vascular;  Laterality: Left;  . AV FISTULA PLACEMENT  10/17/2012   Procedure: ARTERIOVENOUS (AV) FISTULA CREATION;  Surgeon: Mal Misty, MD;  Location: [facility];  Service: Vascular;  Laterality: Left;  . BONE EXOSTOSIS EXCISION Right 06/04/2019   Procedure: EXOSTOSIS EXCISION;  Surgeon: Leanora Cover, MD;  Location: [facility];  Service: Orthopedics;  Laterality: Right;  . CAPD INSERTION N/A 03/15/2014   Procedure: LAPAROSCOPIC INSERTION CONTINUOUS AMBULATORY PERITONEAL DIALYSIS CATHETER, LAPARASCOPIC INCISIONAL HERNIA  REPAIR  WITH MESH, OMENTOPEXY AND LYSIS OF ADHESIONS;  Surgeon: [name], MD;  Location: [facility];  Service: General;  Laterality: N/A;  . CORONARY ANGIOPLASTY WITH STENT PLACEMENT  ~ 2002   "3"  . CORONARY ANGIOPLASTY WITH STENT PLACEMENT  01/24/2004   successful PCI/stenting RCA  drug eluting cypher stent  . CORONARY ANGIOPLASTY WITH STENT PLACEMENT  01/28/2004   successful stentin of a large bifurcation marginal branch of the ramus intermediate vessel  . FLEXIBLE SIGMOIDOSCOPY N/A 09/05/2014   Procedure: FLEXIBLE  SIGMOIDOSCOPY;  Surgeon: Cleotis Nipper, MD;  Location: [facility] ENDOSCOPY;  Service: Endoscopy;  Laterality: N/A;  . HERNIA REPAIR    . INCISION AND DRAINAGE ABSCESS N/A 09/06/2014   Procedure: REMOVAL OF PD CATH;  Surgeon: Coralie Keens, MD;  Location: [facility];  Service: General;  Laterality: N/A;  . INCISIONAL HERNIA REPAIR  03/15/2014  . LAPAROSCOPIC LYSIS OF ADHESIONS  03/15/2014  . NM MYOCAR PERF WALL MOTION  02/06/2012   normal perfusion scan  . PERITONEAL CATHETER INSERTION  03/15/2014  . REFRACTIVE SURGERY Bilateral   . SHUNTOGRAM N/A 03/11/2013   Procedure: Fistulogram;  Surgeon: Conrad Ida, MD;  Location: [facility] CATH LAB;  Service: Cardiovascular;  Laterality: N/A;  . TENDON TRANSFER Right 06/04/2019   Procedure: RIGHT HAND EXTENSOR TENDON TRANSFER TO SMALL FINGER;  Surgeon: Leanora Cover, MD;  Location: [facility];  Service: Orthopedics;  Laterality: Right;  . UMBILICAL HERNIA REPAIR         Family History  Problem Relation Age of Onset  . Asthma Mother   . Hyperlipidemia Mother   . Hypertension Mother   . Stroke Father   . Heart attack Father   . Prostate cancer Father   . Deep vein thrombosis Father   . Cancer Father   . Diabetes Father   . Hyperlipidemia Father   . Hypertension Father   . Other Father        varicose veins  . Heart disease Father        before age 81  . Other Sister        varicose veins    Social History   Tobacco Use  . Smoking status: Former Smoker    Packs/day: 1.00    Years: 15.00    Pack years: 15.00    Types: Cigarettes    Quit date: 09/24/1986    Years since quitting: 33.6  . Smokeless tobacco: Never Used  Vaping Use  . Vaping Use: Never used  Substance Use Topics  . Alcohol use: Not Currently    Comment: stopped in 2013  . Drug use: No    Home Medications Prior to Admission medications   Medication Sig Start Date End Date Taking? Authorizing Provider  acetaminophen (TYLENOL) 325 MG tablet Take 650 mg by mouth  every 6 (six) hours as needed for pain. 09/15/19 09/13/20  [provider]  albuterol (PROVENTIL HFA;VENTOLIN HFA) 108 (90 BASE) MCG/ACT inhaler Inhale 2 puffs into the lungs every 4 (four) hours as needed for wheezing. For wheezing    [provider]  aluminum hydroxide (AMPHOJEL/ALTERNAGEL) 320 MG/5ML suspension Take 10 mLs by mouth every 4 (four) hours as needed for indigestion or heartburn. 05/27/20   Love, Ivan Anchors, PA-C  ascorbic acid (VITAMIN C) 1000 MG tablet Take 1 tablet (1,000 mg total) by mouth 2 (two) times daily. 05/27/20   Love, Ivan Anchors, PA-C  aspirin 81 MG chewable tablet Chew 1 tablet (81 mg total) by mouth every other day. 05/07/20   Simmons-Robinson, Makiera, MD  atorvastatin (LIPITOR) 40 MG tablet Take 40 mg by mouth at bedtime.    [provider]  B Complex-C-Folic Acid (DIALYVITE 956) 0.8 MG TABS Take 1 tablet by mouth daily. 12/01/19   [provider]  carvedilol (COREG) 6.25 MG tablet Take 1 tablet (6.25 mg total) by mouth 2 (two) times daily with a meal. 05/27/20   Love, Ivan Anchors, PA-C  cetaphil (CETAPHIL) lotion Apply 1 application topically daily. 05/27/20   Love, Ivan Anchors, PA-C  cinacalcet (SENSIPAR) 30 MG tablet Take 3 tablets (90 mg total) by mouth daily with supper. 05/06/20   Simmons-Robinson, Riki Sheer, MD  diphenhydrAMINE (BENADRYL) 12.5 MG/5ML elixir Take 5-10 mLs (12.5-25 mg total) by mouth every 6 (six) hours as needed for itching. 05/27/20   Love, Ivan Anchors, PA-C  ferrous gluconate (FERGON) 324 MG tablet Take 1 tablet (324 mg total) by mouth 2 (two) times daily with a meal. 05/27/20   Love, Ivan Anchors, PA-C  fluticasone (FLONASE) 50 MCG/ACT nasal spray Place 2 sprays into both nostrils daily.    [provider]  insulin detemir (LEVEMIR) 100 UNIT/ML injection Inject 0.2 mLs (20 Units total) into the skin at bedtime. 05/06/20   Zenia Resides, MD  melatonin 5 MG TABS Take 1 tablet (5 mg total) by mouth at bedtime. 05/27/20  Love, Pamela S,  PA-C  Nutritional Supplements (,FEEDING SUPPLEMENT, PROSOURCE PLUS) liquid Take 30 mLs by mouth 2 (two) times daily between meals. 05/27/20   Love, Ivan Anchors, PA-C  oxyCODONE (OXY IR/ROXICODONE) 5 MG immediate release tablet Take 1 tablet (5 mg total) by mouth daily as needed for severe pain. 05/27/20   Love, Ivan Anchors, PA-C  pantoprazole (PROTONIX) 40 MG tablet Take 1 tablet (40 mg total) by mouth daily. 05/07/20   Simmons-Robinson, Makiera, MD  polyethylene glycol (MIRALAX / GLYCOLAX) packet Take 17 g by mouth daily. 09/08/14   Janora Norlander, DO  sevelamer carbonate (RENVELA) 2.4 g PACK Take 4.8 g by mouth 3 (three) times daily with meals. 05/27/20   Love, Ivan Anchors, PA-C  umeclidinium bromide (INCRUSE ELLIPTA) 62.5 MCG/INH AEPB Inhale 1 puff into the lungs daily. 05/28/20   Love, Ivan Anchors, PA-C    Allergies    Ferumoxytol, Iron, and Iodinated diagnostic agents  Review of Systems   Review of Systems  Constitutional: Negative for fever.  Respiratory: Negative for shortness of breath.   Cardiovascular: Negative for chest pain.  Gastrointestinal: Negative for abdominal pain, nausea and vomiting.  Genitourinary: Negative for dysuria.  Musculoskeletal:       LLE pain  Skin: Negative for color change.  Neurological: Negative for headaches.  All other systems reviewed and are negative.   Physical Exam Updated Vital Signs BP (!) 159/69 (BP Location: Right Arm)   Pulse 86   Temp 99.1 F (37.3 C) (Oral)   Resp 18   SpO2 96%   Physical Exam Vitals and nursing note reviewed.  Constitutional:      Appearance: Normal appearance. He is well-developed.  HENT:     Head: Normocephalic and atraumatic.  Eyes:     General: Lids are normal.     Conjunctiva/sclera: Conjunctivae normal.     Pupils: Pupils are equal, round, and reactive to light.  Cardiovascular:     Rate and Rhythm: Normal rate and regular rhythm.     Pulses: Normal pulses.     Heart sounds: Normal heart sounds. No murmur heard.   No friction rub. No gallop.   Pulmonary:     Effort: Pulmonary effort is normal.     Breath sounds: Normal breath sounds.     Comments: Lungs clear to auscultation bilaterally.  Symmetric chest rise.  No wheezing, rales, rhonchi. Abdominal:     Palpations: Abdomen is soft. Abdomen is not rigid.     Tenderness: There is no abdominal tenderness. There is no guarding.  Musculoskeletal:        General: Normal range of motion.     Cervical back: Full passive range of motion without pain.     Comments: LLE BKA.  Bandage in place.  No bony deformity.  No overlying warmth, erythema.  Skin:    General: Skin is warm and dry.     Capillary Refill: Capillary refill takes less than 2 seconds.     Comments: Left lower extremity with healing incision site noted at distal left lower extremity.  Staples in place.  No surrounding warmth, erythema.  No active drainage.  Neurological:     Mental Status: He is alert and oriented to person, place, and time.  Psychiatric:        Speech: Speech normal.     ED Results / Procedures / Treatments   Labs (all labs ordered are listed, but only abnormal results are displayed) Labs Reviewed  SARS CORONAVIRUS 2 BY RT  PCR (HOSPITAL ORDER, [facility])    EKG None  Radiology No results found.  Procedures Procedures (including critical care time)  Medications Ordered in ED Medications  HYDROcodone-acetaminophen (NORCO/VICODIN) 5-325 MG per tablet 1 tablet (1 tablet Oral Given 05/28/20 1602)    ED Course  I have reviewed the triage vital signs and the nursing notes.  Pertinent labs & imaging results that were available during my care of the patient were reviewed by me and considered in my medical decision making (see chart for details).    MDM Rules/Calculators/A&P                          64 year old male who presents for evaluation of stent placement as well as left lower extremity pain.  Recent BKA.  He was discharged to  [facility] care but left.  Went to dialysis today and realized he was unable to take care of himself.  His nephrologist (Jonathon Bailey) discussed with him for healthcare he would take him back pending Covid test.  Patient reports he has had the left lower extremity pain.  No warmth, erythema, drainage.  He has not seen vascular. Patient is afebrile, non-toxic appearing, sitting comfortably on examination table. Vital signs reviewed and stable.  On exam, left lower extremity is bandaged.  Bandage was removed.  No surrounding warmth, erythema.  There was some small amount of drainage noted on bandage but no active drainage from the site.  Incision site itself was without warmth, erythema, active drainage.  Appear to be well-healing.  The area was redressed with a nonadherent pad as well as Ace bandage.  We will plan for Covid test, social work consult.  Patient is Covid negative.  Discussed with Alma Friendly (Social Work) has been in contact with the caseworker at [facility] care.  Once patient's Covid test is negative, he can return to the SNF facility.  At this time, his leg exhibits no signs of infection.  Bandage was changed.  Patient given dose of pain medication here in the ED.  He is otherwise stable. At this time, patient exhibits no emergent life-threatening condition that require further evaluation in ED. Patient had ample opportunity for questions and discussion. All patient's questions were answered with full understanding. Strict return precautions discussed. Patient expresses understanding and agreement to plan.   Portions of this note were generated with [employer]. Dictation errors may occur despite best attempts at proofreading.   Final Clinical Impression(s) / ED Diagnoses Final diagnoses:  Left leg pain    Rx / DC Orders ED Discharge Orders    None       Volanda Napoleon, PA-C 05/28/20 [place], [place], DO 05/28/20 1913

## 2020-05-28 NOTE — ED Notes (Signed)
Per Education officer, museum pt needs COVID testing and will be able to go back to [facility] after [employer].

## 2020-05-28 NOTE — Discharge Instructions (Signed)
Your COVID test was negative.   Your bandage was changed today. Continue to monitor for any signs of infections, including warmth, redness, drainage, fever.   Follow up with vascular as directed.   Return to the Emergency Dept for any worsening pain, fevers, redness or other concerns.

## 2020-05-28 NOTE — Care Management (Signed)
Spoke to [place] at [facility]. They are willing to take him back, he does need a rapid COVID screen prior to coming back.

## 2020-05-28 NOTE — Progress Notes (Signed)
As per the note by triage RN, Jonathon Bailey left his new SNF AMA yesterday after just some hours being there and went home. He went to his dialysis this morning but now realizes he is unable to care for himself at home.  I spoke w/ [name] , the Director of Nursing at [facility] and she said that they would take Jonathon Bailey back but that first our Case Manager would need to call their Case Manager. Their Case Manager is [name], phone number is [phone]. Jonathon Bailey did get dialysis this am and should be stable from a renal standpoint.   [name], MD 05/28/2020, 1:22 PM

## 2020-06-01 NOTE — Progress Notes (Signed)
POST OPERATIVE OFFICE NOTE    CC:  F/u for surgery  HPI:  This is a 64 y.o. male who is s/p left BKA on 05/02/2020 by Dr. Donnetta Hutching.  He was recently seen in the ER on 05/28/2020 for left leg pain and SNF placement.  He was discharged from the hospital to [facility].  Patient got upset and left AMA.  He went to dialysis that morning and realized he could not take care of himself and was sent to the ED for SNF placement.  He was having pain in the left stump and per their note, the stump was healing nicely.  He had not fallen or had an injury.  Pt's covid test was negative and he returned to [facility]    Pt underwent arteriogrom on 04/29/2020 with the following findings: AORTA/RENALS: There are single renal arteries bilaterally with no significant renal artery stenosis identified.  The infrarenal aorta is widely patent.  INFLOW: The common iliac, external iliac, and hypogastric arteries are patent.  RIGHT LOWER EXTREMITY RUNOFF: On the right side, the common femoral, deep femoral, and superficial femoral artery are patent.  There is moderate disease of the below-knee popliteal artery.  Below that there is severe tibial artery occlusive disease.  The anterior tibial and posterior tibial arteries are occluded.  There is severe disease in the proximal peroneal artery and then the peroneal artery is patent and collateralizes the distal posterior tibial artery.  The dorsalis pedis artery is occluded.  LEFT LOWER EXTREMITY RUNOFF: On the left side, which is the side of concern, the common femoral, deep femoral, and superficial femoral arteries are patent.  The popliteal artery occludes at the level of the knee.  There are extensive collaterals suggesting that this is chronic.  The below-knee popliteal artery, anterior tibial, tibial peroneal trunk, peroneal, and posterior tibial arteries are occluded.  There is reconstitution of the peroneal artery in the proximal calf and then there is  disease in the peroneal distally.  There is collateralization of a small posterior tibial artery distally which has moderate disease.   He has hx of CAD s/p CABG, ESRD on HD with TDC, CHF, HLD, HTN, and DM.   Pt returns today for follow up.  He denies having any pain.  He denies any fevers.    Allergies  Allergen Reactions  . Ferumoxytol Itching       . Iron Itching        . Iodinated Diagnostic Agents Itching    Current Outpatient Medications  Medication Sig Dispense Refill  . acetaminophen (TYLENOL) 325 MG tablet Take 650 mg by mouth every 6 (six) hours as needed for pain.    Marland Kitchen albuterol (PROVENTIL HFA;VENTOLIN HFA) 108 (90 BASE) MCG/ACT inhaler Inhale 2 puffs into the lungs every 4 (four) hours as needed for wheezing. For wheezing    . aluminum hydroxide (AMPHOJEL/ALTERNAGEL) 320 MG/5ML suspension Take 10 mLs by mouth every 4 (four) hours as needed for indigestion or heartburn. 150 mL 0  . ascorbic acid (VITAMIN C) 1000 MG tablet Take 1 tablet (1,000 mg total) by mouth 2 (two) times daily.    Marland Kitchen aspirin 81 MG chewable tablet Chew 1 tablet (81 mg total) by mouth every other day. 30 tablet 0  . atorvastatin (LIPITOR) 40 MG tablet Take 40 mg by mouth at bedtime.    . B Complex-C-Folic Acid (DIALYVITE 035) 0.8 MG TABS Take 1 tablet by mouth daily.    . carvedilol (COREG) 6.25 MG  tablet Take 1 tablet (6.25 mg total) by mouth 2 (two) times daily with a meal.    . cetaphil (CETAPHIL) lotion Apply 1 application topically daily. 236 mL 0  . cinacalcet (SENSIPAR) 30 MG tablet Take 3 tablets (90 mg total) by mouth daily with supper. 30 tablet 0  . diphenhydrAMINE (BENADRYL) 12.5 MG/5ML elixir Take 5-10 mLs (12.5-25 mg total) by mouth every 6 (six) hours as needed for itching. 120 mL 0  . ferrous gluconate (FERGON) 324 MG tablet Take 1 tablet (324 mg total) by mouth 2 (two) times daily with a meal.  3  . fluticasone (FLONASE) 50 MCG/ACT nasal spray Place 2 sprays into both nostrils daily.      . insulin detemir (LEVEMIR) 100 UNIT/ML injection Inject 0.2 mLs (20 Units total) into the skin at bedtime. 10 mL 11  . melatonin 5 MG TABS Take 1 tablet (5 mg total) by mouth at bedtime.  0  . Nutritional Supplements (,FEEDING SUPPLEMENT, PROSOURCE PLUS) liquid Take 30 mLs by mouth 2 (two) times daily between meals.    Marland Kitchen oxyCODONE (OXY IR/ROXICODONE) 5 MG immediate release tablet Take 1 tablet (5 mg total) by mouth daily as needed for severe pain. 5 tablet 0  . pantoprazole (PROTONIX) 40 MG tablet Take 1 tablet (40 mg total) by mouth daily. 30 tablet 0  . polyethylene glycol (MIRALAX / GLYCOLAX) packet Take 17 g by mouth daily. 14 each 0  . sevelamer carbonate (RENVELA) 2.4 g PACK Take 4.8 g by mouth 3 (three) times daily with meals. 90 each   . umeclidinium bromide (INCRUSE ELLIPTA) 62.5 MCG/INH AEPB Inhale 1 puff into the lungs daily.     No current facility-administered medications for this visit.     ROS:  See HPI  Physical Exam:  Today's Vitals   06/03/20 0923  BP: 127/78  Pulse: 84  Resp: 20  Temp: 98.7 F (37.1 C)  TempSrc: Temporal  SpO2: 97%  Height: 6' (1.829 m)   Body mass index is 36.99 kg/m.   Incision:  In tact with staples  Extremities:      The posterior flap is boggy.  There is no evidence of overt infection.      Assessment/Plan:  This is a 64 y.o. male who is s/p: left BKA on 05/02/2020 by Dr. Donnetta Hutching  -the left BKA stump is concerning for healing.  The posterior flap is boggy.  No overt signs of infection but will give Keflex 500mg  daily x 7 days.  Dr. Donzetta Matters reviewed the angiogram and the popliteal artery occludes at the level of the knee.  He discussed with pt that this may not heal and could  may require revision AKA in the future.  Pt states he will die before he gets an above knee amputation.  Dr. Donzetta Matters explained to him if he got septic from his stump, he could die.   -will stop using the shrinkers for now.  Bobby from [facility] brought soft stump sock.   I have given the SNF instructions to gently wash the BKA with soap and water daily then apply stump sock.  Also, avoid pressure on the stump.   -pt will f/u in 2 weeks with Dr. Donzetta Matters -he will need f/u ABI for the right leg in the future as he had severe tibial artery occlusive disease on the right on the arteriogram.  -pt will call us sooner if he has any issues before his next visit.    Leontine Locket, [facility] [phone]  Clinic MD:  Pt seen and examined with Dr. Donzetta Matters

## 2020-06-03 ENCOUNTER — Other Ambulatory Visit: Payer: Self-pay

## 2020-06-03 ENCOUNTER — Ambulatory Visit (INDEPENDENT_AMBULATORY_CARE_PROVIDER_SITE_OTHER): Payer: Self-pay | Admitting: Physician Assistant

## 2020-06-03 VITALS — BP 127/78 | HR 84 | Temp 98.7°F | Resp 20 | Ht 72.0 in

## 2020-06-03 DIAGNOSIS — Z89512 Acquired absence of left leg below knee: Secondary | ICD-10-CM

## 2020-06-09 ENCOUNTER — Encounter: Payer: No Typology Code available for payment source | Admitting: Registered Nurse

## 2020-06-10 ENCOUNTER — Encounter: Payer: No Typology Code available for payment source | Admitting: Physical Medicine & Rehabilitation

## 2020-06-17 ENCOUNTER — Encounter: Payer: Self-pay | Admitting: Vascular Surgery

## 2020-06-17 ENCOUNTER — Ambulatory Visit (INDEPENDENT_AMBULATORY_CARE_PROVIDER_SITE_OTHER): Payer: Self-pay | Admitting: Vascular Surgery

## 2020-06-17 ENCOUNTER — Other Ambulatory Visit: Payer: Self-pay

## 2020-06-17 VITALS — BP 138/89 | HR 97 | Temp 99.5°F | Resp 20 | Ht 72.0 in | Wt 273.0 lb

## 2020-06-17 DIAGNOSIS — I739 Peripheral vascular disease, unspecified: Secondary | ICD-10-CM

## 2020-06-17 DIAGNOSIS — Z89512 Acquired absence of left leg below knee: Secondary | ICD-10-CM

## 2020-06-17 NOTE — Progress Notes (Signed)
    Subjective:     Patient ID: Jonathon Bailey, male   DOB: 1956/01/12, 64 y.o.   MRN: [id number]  HPI 64yo male status post left below-knee amputation.  At last visit he did have some drainage.  He was placed on antibiotic for 7 days.  He has been washing it with soap and water.  He also is wearing a stump sock.  Overall he is happy with the progression.   Review of Systems No complaints today    Objective:   Physical Exam Vitals:   06/17/20 1124  BP: 138/89  Pulse: 97  Resp: 20  Temp: 99.5 F (37.5 C)  SpO2: 95%   Awake alert oriented On the respirations Left lower knee amputation site much less edema healing well Right foot well perfused no tissue loss or ulceration    Assessment:     64 year old male with left lower extremity below-knee amputation now healing well.    Plan:     After the staples were removed today, still too edematous to remove all of them Follow-up in 2 to 3 weeks with ABIs of the right lower extremity and to remove the rest of the staples.  Rozalynn Buege C. Donzetta Matters, MD [facility] Office: [phone] Pager: [phone]

## 2020-06-21 ENCOUNTER — Other Ambulatory Visit: Payer: Self-pay | Admitting: *Deleted

## 2020-06-21 DIAGNOSIS — I739 Peripheral vascular disease, unspecified: Secondary | ICD-10-CM

## 2020-06-30 ENCOUNTER — Telehealth: Payer: Self-pay

## 2020-06-30 NOTE — Telephone Encounter (Signed)
Patient called requesting medication to help him sleep. Advised him that this is not something we prescribe from this office. Instructed him to call his PCP at the [place] and request a script from them. In the meantime, he could go to a pharmacy and ask the pharmacist for OTC recommendations. Patient verbalized understanding.

## 2020-07-01 ENCOUNTER — Ambulatory Visit: Payer: No Typology Code available for payment source

## 2020-07-01 ENCOUNTER — Inpatient Hospital Stay (HOSPITAL_COMMUNITY): Admission: RE | Admit: 2020-07-01 | Payer: No Typology Code available for payment source | Source: Ambulatory Visit

## 2020-07-04 ENCOUNTER — Other Ambulatory Visit: Payer: Self-pay

## 2020-07-04 ENCOUNTER — Encounter: Payer: Self-pay | Admitting: Physical Medicine & Rehabilitation

## 2020-07-04 ENCOUNTER — Encounter: Payer: Medicare Other | Attending: Registered Nurse | Admitting: Physical Medicine & Rehabilitation

## 2020-07-04 VITALS — BP 131/85 | HR 80 | Temp 98.2°F | Ht 72.0 in | Wt 263.0 lb

## 2020-07-04 DIAGNOSIS — Z89512 Acquired absence of left leg below knee: Secondary | ICD-10-CM | POA: Diagnosis present

## 2020-07-04 DIAGNOSIS — G547 Phantom limb syndrome without pain: Secondary | ICD-10-CM

## 2020-07-04 DIAGNOSIS — K5903 Drug induced constipation: Secondary | ICD-10-CM

## 2020-07-04 DIAGNOSIS — R269 Unspecified abnormalities of gait and mobility: Secondary | ICD-10-CM

## 2020-07-04 DIAGNOSIS — I10 Essential (primary) hypertension: Secondary | ICD-10-CM | POA: Diagnosis not present

## 2020-07-04 DIAGNOSIS — E1165 Type 2 diabetes mellitus with hyperglycemia: Secondary | ICD-10-CM

## 2020-07-04 DIAGNOSIS — Z794 Long term (current) use of insulin: Secondary | ICD-10-CM | POA: Insufficient documentation

## 2020-07-04 MED ORDER — GABAPENTIN 100 MG PO CAPS: 100.0000 mg | ORAL_CAPSULE | ORAL | 0 refills | Status: DC | PRN

## 2020-07-04 NOTE — Progress Notes (Signed)
Subjective:    Patient ID: Jonathon Bailey, male    DOB: [birth year], 64 y.o.   MRN: [id number]  HPI Male with history of CAD s/p CABG, ESRD- TTS HD, T2DM presents for hospital follow up for left BKA.    Since discharge, he went to the ED for SNF placement - notes reviewed.  Pt states he went to a SNF, but did not like it, someone picked him up and he went to the ED in hopes of going to another facility, but was sent back to SNF.  He completed 3 week stay there.  He following up with Vasc. He had some of his staples removed. He has intermittent phantom pain. He states CBGs have been "okay". No issues with HD. He is constipated.  Therapies: 2/week DME: Bedside commode Mobility: Wheelchair at all times  Pain Inventory Average Pain 8 Pain Right Now 0 My pain is intermittent and aching  In the last 24 hours, has pain interfered with the following? General activity 7 Relation with others 0 Enjoyment of life 10 What TIME of day is your pain at its worst? varies Sleep (in general) Poor  Pain is worse with: unknown Pain improves with: nothing make it better Relief from Meds: No medication of pain  use a wheelchair transfers alone Do you have any goals in this area?  yes  disabled: date disabled For years I need assistance with the following:  feeding, dressing, bathing, toileting, meal prep, household duties and shopping Do you have any goals in this area?  yes  tingling trouble walking depression  New Patient  New Patient    Family History  Problem Relation Age of Onset  . Asthma Mother   . Hyperlipidemia Mother   . Hypertension Mother   . Stroke Father   . Heart attack Father   . Prostate cancer Father   . Deep vein thrombosis Father   . Cancer Father   . Diabetes Father   . Hyperlipidemia Father   . Hypertension Father   . Other Father        varicose veins  . Heart disease Father        before age 57  . Other Sister        varicose veins   Social  History   Socioeconomic History  . Marital status: Divorced    Spouse name: Not on file  . Number of children: Not on file  . Years of education: Not on file  . Highest education level: Not on file  Occupational History  . Not on file  Tobacco Use  . Smoking status: Former Smoker    Packs/day: 1.00    Years: 15.00    Pack years: 15.00    Types: Cigarettes    Quit date: 09/24/1986    Years since quitting: 33.8  . Smokeless tobacco: Never Used  Vaping Use  . Vaping Use: Never used  Substance and Sexual Activity  . Alcohol use: Not Currently    Comment: stopped in 2013  . Drug use: No  . Sexual activity: Not Currently  Other Topics Concern  . Not on file  Social History Narrative  . Not on file   Social Determinants of Health   Financial Resource Strain:   . Difficulty of Paying Living Expenses: Not on file  Food Insecurity:   . Worried About Charity fundraiser in the Last Year: Not on file  . Ran Out of Food in the Last Year: Not  on file  Transportation Needs:   . Lack of Transportation (Medical): Not on file  . Lack of Transportation (Non-Medical): Not on file  Physical Activity:   . Days of Exercise per Week: Not on file  . Minutes of Exercise per Session: Not on file  Stress:   . Feeling of Stress : Not on file  Social Connections:   . Frequency of Communication with Friends and Family: Not on file  . Frequency of Social Gatherings with Friends and Family: Not on file  . Attends Religious Services: Not on file  . Active Member of Clubs or Organizations: Not on file  . Attends Archivist Meetings: Not on file  . Marital Status: Not on file   Past Surgical History:  Procedure Laterality Date  . ABDOMINAL AORTOGRAM W/LOWER EXTREMITY N/A 04/29/2020   Procedure: ABDOMINAL AORTOGRAM W/LOWER EXTREMITY;  Surgeon: Angelia Mould, MD;  Location: [facility];  Service: Cardiovascular;  Laterality: N/A;  . AMPUTATION Left 05/02/2020   Procedure:  AMPUTATION BELOW KNEE;  Surgeon: Rosetta Posner, MD;  Location: [facility];  Service: Vascular;  Laterality: Left;  . AV FISTULA PLACEMENT  10/17/2012   Procedure: ARTERIOVENOUS (AV) FISTULA CREATION;  Surgeon: Mal Misty, MD;  Location: [facility];  Service: Vascular;  Laterality: Left;  . BONE EXOSTOSIS EXCISION Right 06/04/2019   Procedure: EXOSTOSIS EXCISION;  Surgeon: Leanora Cover, MD;  Location: [facility];  Service: Orthopedics;  Laterality: Right;  . CAPD INSERTION N/A 03/15/2014   Procedure: LAPAROSCOPIC INSERTION CONTINUOUS AMBULATORY PERITONEAL DIALYSIS CATHETER, LAPARASCOPIC INCISIONAL HERNIA  REPAIR  WITH MESH, OMENTOPEXY AND LYSIS OF ADHESIONS;  Surgeon: [name], MD;  Location: [facility];  Service: General;  Laterality: N/A;  . CORONARY ANGIOPLASTY WITH STENT PLACEMENT  ~ 2002   "3"  . CORONARY ANGIOPLASTY WITH STENT PLACEMENT  01/24/2004   successful PCI/stenting RCA  drug eluting cypher stent  . CORONARY ANGIOPLASTY WITH STENT PLACEMENT  01/28/2004   successful stentin of a large bifurcation marginal branch of the ramus intermediate vessel  . FLEXIBLE SIGMOIDOSCOPY N/A 09/05/2014   Procedure: FLEXIBLE SIGMOIDOSCOPY;  Surgeon: Cleotis Nipper, MD;  Location: [facility] ENDOSCOPY;  Service: Endoscopy;  Laterality: N/A;  . HERNIA REPAIR    . INCISION AND DRAINAGE ABSCESS N/A 09/06/2014   Procedure: REMOVAL OF PD CATH;  Surgeon: Coralie Keens, MD;  Location: [facility];  Service: General;  Laterality: N/A;  . INCISIONAL HERNIA REPAIR  03/15/2014  . LAPAROSCOPIC LYSIS OF ADHESIONS  03/15/2014  . NM MYOCAR PERF WALL MOTION  02/06/2012   normal perfusion scan  . PERITONEAL CATHETER INSERTION  03/15/2014  . REFRACTIVE SURGERY Bilateral   . SHUNTOGRAM N/A 03/11/2013   Procedure: Fistulogram;  Surgeon: Conrad Franklinton, MD;  [facility];  Service: Cardiovascular;  Laterality: N/A;  . TENDON TRANSFER Right 06/04/2019   Procedure: RIGHT HAND EXTENSOR TENDON TRANSFER TO SMALL FINGER;   Surgeon: Leanora Cover, MD;  Location: [facility];  Service: Orthopedics;  Laterality: Right;  . UMBILICAL HERNIA REPAIR     Past Medical History:  Diagnosis Date  . Anxiety   . Arthritis   . Asthma    "as a child"  . CAD (coronary artery disease)   . Chronic systolic heart failure ([place]) 09/21/2008   ECHO Feb 2013 showed LVEF low normal at 50-55%, +hypokinetic anterolateral wall and inferolateral wall.    . CKD (chronic kidney disease) stage 4, GFR 15-29 ml/min (HCC) 08/30/2009  Progressive renal failure since 2008, creatinine 1.2 in 2008 up to 3.5 in 2012 and 3.2-5.0 in 2013. All UA's 2011-13 showed >300 protein on dipstick. Work-up in May 2011 showed negative Urine IFE and SPEP, ultrasound showed 12-13 cm kidneys with increased echogenicity and UPC ratio was 1.5 gm proteinuria.  Hgb A1C's from 2011 to 2013 were all between 9-11.  Patient saw Dr. Donnetta Hutching (vasc surgery) for HD access in Aug 2013 > vein mapping was done and Dr. Donnetta Hutching felt the left arm (pt is R handed) was suitable for L arm Cimino radiocephalic fistula. Patient said he wasn't ready to consider doing dialysis and declined the surgery.     . Depression   . Diabetic retinopathy ([place])   . ESRD (end stage renal disease) on dialysis ([place])   . HUDJSHFW(263.7)    "maybe monthly" (03/15/2014)  . Hyperlipidemia   . Hypertension   . Macular edema   . Myocardial infarction [facility])    status post MI x2 and 3 stents placed in 2003  . Obesity   . Sleep apnea    does not use CPAP  . Stroke [facility]) ~ 2007; ~1987   "weak on right side; messed w/right side of brain; cry all the time"  . Type II diabetes mellitus (HCC)    insulin dependent   BP 131/85   Pulse 80   Temp 98.2 F (36.8 C)   Ht 6' (1.829 m)   Wt 263 lb (119.3 kg) Comment: per pt in wheel chair  SpO2 97%   BMI 35.67 kg/m   Opioid Risk Score:   Fall Risk Score:  `1  Depression screen PHQ 2/9  Depression screen [facility] 2/9 07/04/2020 12/02/2013 08/12/2013   Decreased Interest 3 0 0  Down, Depressed, Hopeless 2 0 0  PHQ - 2 Score 5 0 0  Altered sleeping 3 - -  Tired, decreased energy 0 - -  Change in appetite 0 - -  Feeling bad or failure about yourself  3 - -  Trouble concentrating 0 - -  Moving slowly or fidgety/restless 2 - -  Suicidal thoughts 0 - -  PHQ-9 Score 13 - -  Some recent data might be hidden   Review of Systems  Constitutional: Negative.   HENT: Negative.   Eyes: Negative.   Cardiovascular: Negative.   Gastrointestinal: Positive for constipation.  Endocrine: Negative.   Genitourinary: Negative.   Musculoskeletal: Positive for gait problem and myalgias.  Allergic/Immunologic: Negative.   Neurological: Positive for numbness.       Tingling numbness  Psychiatric/Behavioral:       Depression  All other systems reviewed and are negative.      Objective:   Physical Exam  Constitutional: No distress . Vital signs reviewed. HENT: Normocephalic.  Atraumatic. Eyes: EOMI. No discharge. Cardiovascular: No JVD.   Respiratory: Normal effort.  No stridor.   GI: Non-distended.   Skin: Stump with some staples CDI Ulcer to proximal tibia, ?healing Psych: Flat. Irritable. Musc: Edema and tenderness in left stump, improving Neuro: Alert Motor: Bilateral upper extremities: 5/5 proximal distal Left lower extremity: 4+/5 proximal distally, unchanged Right lower extremity: Hip flexion 4-4+/5 (pain inhibition)     Assessment & Plan:  Male with history of CAD s/p CABG, ESRD- TTS HD, T2DM presents for hospital follow up for left BKA.    1.  Limitations with mobility, transfers, endurance, self-care secondary to left BKA.             Continue therapies  Stump shrinker d/ced by Vasc per patient  2. Phantom limb pain  Will order Gabapentin 100 3/week after HD  3. Proximal tibia ulcer  Monitor closely, healing per patient  4. T2DM with hyperglycemia:    Encourage diet compliance, states "some what".  5.   ESRD:   Cont follow up with [facility]  6. BP  Controlled today  Cont meds  7.  Slow transit constipation/Likely diabetic gastroparesis  Does not want meds at present  8. Gait abnormality  Cont wheelchair for safety  Cont therapies

## 2020-07-18 ENCOUNTER — Other Ambulatory Visit: Payer: Self-pay

## 2020-07-18 ENCOUNTER — Ambulatory Visit: Payer: Medicare Other

## 2020-07-18 ENCOUNTER — Ambulatory Visit (HOSPITAL_COMMUNITY)
Admission: RE | Admit: 2020-07-18 | Discharge: 2020-07-18 | Disposition: A | Discharge status: Home / Self Care | Payer: Medicare Other | Source: Ambulatory Visit | Attending: Physician Assistant | Admitting: Physician Assistant

## 2020-07-18 DIAGNOSIS — I739 Peripheral vascular disease, unspecified: Secondary | ICD-10-CM | POA: Diagnosis not present

## 2020-07-25 ENCOUNTER — Encounter: Payer: Medicare Other | Admitting: Physical Medicine & Rehabilitation

## 2020-07-26 ENCOUNTER — Telehealth: Payer: Self-pay

## 2020-07-26 NOTE — Telephone Encounter (Signed)
Jessica a nurse from [facility] called to report a new 2 cm area of dehiscence with thick yellow pus on the L BKA. Denies any odor or s/s of infection. The area of pus started on Saturday. No other changes noted by nurse. Discussed with PA. HH has another appt to see pat on Friday, instructed to call back if there are any more wound changes or s/s of infection. Nurse verbalized understanding.

## 2020-07-29 ENCOUNTER — Telehealth: Payer: Self-pay

## 2020-07-29 NOTE — Telephone Encounter (Signed)
Nurse called back from [facility]. Patient's wound is unchanged. Still a scant amount of yellowish odorless drainage. He is afebrile and denies pain. Instructed to keep clean and dry. HH will see again on Wednesday. Instructed nurse to call back if s/s of infection or unrelieved pain. Patient also knows to call if there are problems in the meantime.

## 2020-08-05 ENCOUNTER — Other Ambulatory Visit: Payer: Self-pay

## 2020-08-05 ENCOUNTER — Encounter: Payer: Self-pay | Admitting: Podiatry

## 2020-08-05 ENCOUNTER — Ambulatory Visit (INDEPENDENT_AMBULATORY_CARE_PROVIDER_SITE_OTHER): Payer: Self-pay | Admitting: Vascular Surgery

## 2020-08-05 ENCOUNTER — Ambulatory Visit (INDEPENDENT_AMBULATORY_CARE_PROVIDER_SITE_OTHER): Payer: Medicare Other | Admitting: Podiatry

## 2020-08-05 ENCOUNTER — Telehealth: Payer: Self-pay

## 2020-08-05 ENCOUNTER — Encounter: Payer: Self-pay | Admitting: Vascular Surgery

## 2020-08-05 VITALS — BP 136/83 | HR 80 | Temp 98.5°F | Resp 20 | Ht 72.0 in | Wt 263.0 lb

## 2020-08-05 DIAGNOSIS — Z89512 Acquired absence of left leg below knee: Secondary | ICD-10-CM

## 2020-08-05 DIAGNOSIS — I739 Peripheral vascular disease, unspecified: Secondary | ICD-10-CM

## 2020-08-05 DIAGNOSIS — B351 Tinea unguium: Secondary | ICD-10-CM

## 2020-08-05 DIAGNOSIS — E1142 Type 2 diabetes mellitus with diabetic polyneuropathy: Secondary | ICD-10-CM

## 2020-08-05 DIAGNOSIS — E119 Type 2 diabetes mellitus without complications: Secondary | ICD-10-CM

## 2020-08-05 DIAGNOSIS — L84 Corns and callosities: Secondary | ICD-10-CM

## 2020-08-05 NOTE — Telephone Encounter (Signed)
Anissa w/ [facility] called to inquire about continued wound care orders. Spoke with Dr. Donzetta Matters, advised that pt does not need wound care at this time and will be referred to biotech for prosthesis in the future. Returned call to [employer] with this information. Stated "OK, I will let them know" and hung up.

## 2020-08-05 NOTE — Progress Notes (Signed)
    Subjective:     Patient ID: Jonathon Bailey, male   DOB: 1955/12/15, 64 y.o.   MRN: [id number]  HPI 64 year old male status post left below-knee amputation.  He follows up for completion of staple removal.  Previously had too much edema and dehiscence of the wound.  States that now the wound has mostly healed.   Review of Systems No complaints today    Objective:   Physical Exam Vitals:   08/05/20 0844  BP: 136/83  Pulse: 80  Resp: 20  Temp: 98.5 F (36.9 C)  SpO2: 93%  Awake alert oriented Left lower knee amputation staples removed is well-healed     Assessment/plan     64 year old male status post left below-knee amputation.  He also has severe tibial disease on the right but no wounds at this time.  He will follow-up in 6 months with repeat ABI right lower extremity.  Will refer to biotech at this time.    Jonathon Bailey C. Donzetta Matters, MD [facility] [place] Office: [phone] Pager: [phone]

## 2020-08-07 NOTE — Progress Notes (Signed)
Subjective: Jonathon Bailey presents today referred by a friend/church member for  for diabetic foot evaluation and at risk foot care. 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   He relates 30+ h/o diabetes. He is on hemodialysis on TTS.   Jonathon Bailey has h/o below knee amputation of the LLE, performed in August of this year.   He relates numbness in right foot.  Past Medical History:  Diagnosis Date   Anxiety    Arthritis    Asthma    "as a child"   CAD (coronary artery disease)    Chronic systolic heart failure ([place]) 09/21/2008   ECHO Feb 2013 showed LVEF low normal at 50-55%, +hypokinetic anterolateral wall and inferolateral wall.     CKD (chronic kidney disease) stage 4, GFR 15-29 ml/min (HCC) 08/30/2009   Progressive renal failure since 2008, creatinine 1.2 in 2008 up to 3.5 in 2012 and 3.2-5.0 in 2013. All UA's 2011-13 showed >300 protein on dipstick. Work-up in May 2011 showed negative Urine IFE and SPEP, ultrasound showed 12-13 cm kidneys with increased echogenicity and UPC ratio was 1.5 gm proteinuria.  Hgb A1C's from 2011 to 2013 were all between 9-11.  Patient saw Dr. Donnetta Hutching (vasc surgery) for HD access in Aug 2013 > vein mapping was done and Dr. Donnetta Hutching felt the left arm (pt is R handed) was suitable for L arm Cimino radiocephalic fistula. Patient said he wasn't ready to consider doing dialysis and declined the surgery.      Depression    Diabetic retinopathy ([place])    ESRD (end stage renal disease) on dialysis ([place])    Headache(784.0)    "maybe monthly" (03/15/2014)   Hyperlipidemia    Hypertension    Macular edema    Myocardial infarction [facility])    status post MI x2 and 3 stents placed in 2003   Obesity    Sleep apnea    does not use CPAP   Stroke ([place]) ~ 2007; ~1987   "weak on right side; messed w/right side of brain; cry all the time"   Type II diabetes mellitus ([place])    insulin dependent     Patient Active Problem  List   Diagnosis Date Noted   Moderate protein-calorie malnutrition ([place]) 05/27/2020   Allergy, unspecified, initial encounter 05/18/2020   Anaphylactic shock, unspecified, initial encounter 05/18/2020   ESRD on dialysis [facility])    Constipation    Noncompliance    Abdominal distension    Drug induced constipation    Leukocytosis    Essential hypertension    Labile blood pressure    Controlled type 2 diabetes mellitus with hyperglycemia, with long-term current use of insulin (HCC)    Unilateral complete BKA, left, initial encounter ([place]) 05/06/2020   Postoperative pain    Acute on chronic anemia    Anemia of chronic disease    S/P BKA (below knee amputation) unilateral, left ([place])    Sepsis without septic shock ([place]) 04/28/2020   Wet gangrene ([place]) 04/28/2020   Fluid overload, unspecified 08/03/2019   Hyperkalemia 10/21/2018   Encounter for removal of sutures 09/22/2018   Morbid obesity with BMI of 40.0-44.9, adult ([place]) 03/05/2018   History of completed stroke 02/14/2018   Unstable angina ([place]) 02/14/2018   Dependence on renal dialysis ([place]) 11/12/2017   Chills (without fever) 01/01/2017   Local infection due to central venous catheter, initial encounter 09/08/2016   Diabetic retinopathy ([place]) 08/21/2016   Hypercalcemia 10/25/2014   Anxiety disorder,  unspecified 09/10/2014   Coagulation defect, unspecified ([place]) 09/10/2014   Iron deficiency anemia, unspecified 09/10/2014   Major depressive disorder, single episode, unspecified 09/10/2014   Pain, unspecified 09/10/2014   Peritonitis, unspecified ([place]) 09/10/2014   Pruritus, unspecified 09/10/2014   Secondary hyperparathyroidism of renal origin ([place]) 09/10/2014   Unspecified osteoarthritis, unspecified site 09/10/2014   Other specified fever    Generalized abdominal pain    Acute lower GI bleeding    ESRD (end stage renal disease) ([place])    Bacterial peritonitis ([place])    Blood  poisoning    ESRD on peritoneal dialysis ([place])    Acute blood loss anemia    Abdominal pain 09/01/2014   CAPD (continuous ambulatory peritoneal dialysis) status 03/15/2014   CAD S/P percutaneous coronary angioplasty 02/03/2014   H/O CHF 02/03/2014   History of cocaine use 02/03/2014   S/P coronary artery stent placement 02/03/2014   Morbid obesity ([place]) 02/01/2014   Recurrent incisional hernia x5 with incarceration s/p lap repair w mesh 03/15/2014 02/01/2014   Preventative health care 47/05/6282   Other complications due to renal dialysis device, implant, and graft 10/24/2012   COPD (chronic obstructive pulmonary disease) ([place]) 10/23/2012   DIABETIC PERIPHERAL NEUROPATHY 01/09/2010   Type 2 diabetes mellitus with diabetic neuropathy, unspecified ([place]) 01/09/2010   ESRD (end stage renal disease) on dialysis ([place]) 08/30/2009   CEREBROVASCULAR ACCIDENT, HX OF 08/30/2009   Cerebral infarction ([place]) 08/30/2009   Diabetes mellitus type 2 with complications ([place]) 66/29/4765   HYPERLIPIDEMIA 09/21/2008   OBSTRUCTIVE SLEEP APNEA 09/21/2008   ESSENTIAL HYPERTENSION 09/21/2008   Coronary atherosclerosis 09/21/2008   Chronic combined systolic and diastolic heart failure ([place]) 09/21/2008     Past Surgical History:  Procedure Laterality Date   ABDOMINAL AORTOGRAM W/LOWER EXTREMITY N/A 04/29/2020   Procedure: ABDOMINAL AORTOGRAM W/LOWER EXTREMITY;  Surgeon: Angelia Mould, MD;  Location: [facility];  Service: Cardiovascular;  Laterality: N/A;   AMPUTATION Left 05/02/2020   Procedure: AMPUTATION BELOW KNEE;  Surgeon: Rosetta Posner, MD;  Location: [facility];  Service: Vascular;  Laterality: Left;   AV FISTULA PLACEMENT  10/17/2012   Procedure: ARTERIOVENOUS (AV) FISTULA CREATION;  Surgeon: Mal Misty, MD;  Location: [facility];  Service: Vascular;  Laterality: Left;   BONE EXOSTOSIS EXCISION Right 06/04/2019   Procedure: EXOSTOSIS EXCISION;  Surgeon: Leanora Cover,  MD;  Location: [facility];  Service: Orthopedics;  Laterality: Right;   CAPD INSERTION N/A 03/15/2014   Procedure: LAPAROSCOPIC INSERTION CONTINUOUS AMBULATORY PERITONEAL DIALYSIS CATHETER, LAPARASCOPIC INCISIONAL HERNIA  REPAIR  WITH MESH, OMENTOPEXY AND LYSIS OF ADHESIONS;  Surgeon: [name], MD;  Location: [facility];  Service: General;  Laterality: N/A;   CORONARY ANGIOPLASTY WITH STENT PLACEMENT  ~ 2002   "3"   CORONARY ANGIOPLASTY WITH STENT PLACEMENT  01/24/2004   successful PCI/stenting RCA  drug eluting cypher stent   CORONARY ANGIOPLASTY WITH STENT PLACEMENT  01/28/2004   successful stentin of a large bifurcation marginal branch of the ramus intermediate vessel   FLEXIBLE SIGMOIDOSCOPY N/A 09/05/2014   Procedure: FLEXIBLE SIGMOIDOSCOPY;  Surgeon: Cleotis Nipper, MD;  Location: [facility];  Service: Endoscopy;  Laterality: N/A;   HERNIA REPAIR     INCISION AND DRAINAGE ABSCESS N/A 09/06/2014   Procedure: REMOVAL OF PD CATH;  Surgeon: Coralie Keens, MD;  Location: [facility];  Service: General;  Laterality: N/A;   [place]  03/15/2014   LAPAROSCOPIC LYSIS OF ADHESIONS  03/15/2014   NM Gwenevere Ghazi  PERF WALL MOTION  02/06/2012   normal perfusion scan   PERITONEAL CATHETER INSERTION  03/15/2014   REFRACTIVE SURGERY Bilateral    SHUNTOGRAM N/A 03/11/2013   Procedure: Fistulogram;  Surgeon: Conrad [place], MD;  Location: [facility] C[facility];  Service: Cardiovascular;  Laterality: N/A;   TENDON TRANSFER Right 06/04/2019   Procedure: RIGHT HAND EXTENSOR TENDON TRANSFER TO SMALL FINGER;  Surgeon: Leanora Cover, MD;  Location: [facility];  Service: Orthopedics;  Laterality: Right;   UMBILICAL HERNIA REPAIR       Current Outpatient Medications on File Prior to Visit  Medication Sig Dispense Refill   acetaminophen (TYLENOL) 325 MG tablet Take 650 mg by mouth every 6 (six) hours as needed for pain.     albuterol (PROVENTIL HFA;VENTOLIN HFA) 108  (90 BASE) MCG/ACT inhaler Inhale 2 puffs into the lungs every 4 (four) hours as needed for wheezing. For wheezing     aluminum hydroxide (AMPHOJEL/ALTERNAGEL) 320 MG/5ML suspension Take 10 mLs by mouth every 4 (four) hours as needed for indigestion or heartburn. 150 mL 0   ascorbic acid (VITAMIN C) 1000 MG tablet Take 1 tablet (1,000 mg total) by mouth 2 (two) times daily.     aspirin 81 MG chewable tablet Chew 1 tablet (81 mg total) by mouth every other day. 30 tablet 0   atorvastatin (LIPITOR) 40 MG tablet Take 40 mg by mouth at bedtime.     B Complex-C-Folic Acid (DIALYVITE 242) 0.8 MG TABS Take 1 tablet by mouth daily.     BAYER ASPIRIN EC LOW DOSE 81 MG EC tablet Take 81 mg by mouth daily.     carvedilol (COREG) 6.25 MG tablet Take 1 tablet (6.25 mg total) by mouth 2 (two) times daily with a meal.     cetaphil (CETAPHIL) lotion Apply 1 application topically daily. 236 mL 0   cetaphil (CETAPHIL) lotion Apply topically.     cinacalcet (SENSIPAR) 30 MG tablet Take 3 tablets (90 mg total) by mouth daily with supper. 30 tablet 0   diphenhydrAMINE (BENADRYL) 12.5 MG/5ML elixir Take 5-10 mLs (12.5-25 mg total) by mouth every 6 (six) hours as needed for itching. 120 mL 0   diphenhydrAMINE (BENADRYL) 12.5 MG/5ML elixir Take by mouth.     ferrous gluconate (FERGON) 324 MG tablet Take 1 tablet (324 mg total) by mouth 2 (two) times daily with a meal.  3   fluticasone (FLONASE) 50 MCG/ACT nasal spray Place 2 sprays into both nostrils daily.     gabapentin (NEURONTIN) 100 MG capsule Take 1 capsule (100 mg total) by mouth every dialysis. 12 capsule 0   insulin detemir (LEVEMIR) 100 UNIT/ML injection Inject 0.2 mLs (20 Units total) into the skin at bedtime. 10 mL 11   melatonin 5 MG TABS Take 1 tablet (5 mg total) by mouth at bedtime.  0   melatonin 5 MG TABS Take by mouth.     Methoxy PEG-Epoetin Beta (MIRCERA IJ) Mircera     Nutritional Supplements (,FEEDING SUPPLEMENT, PROSOURCE PLUS)  liquid Take 30 mLs by mouth 2 (two) times daily between meals.     Nutritional Supplements (,FEEDING SUPPLEMENT, PROSOURCE PLUS) liquid Take by mouth.     oxyCODONE (OXY IR/ROXICODONE) 5 MG immediate release tablet Take 1 tablet (5 mg total) by mouth daily as needed for severe pain. 5 tablet 0   oxyCODONE (OXY IR/ROXICODONE) 5 MG immediate release tablet Take by mouth.     pantoprazole (PROTONIX) 40 MG tablet Take 1 tablet (40  mg total) by mouth daily. 30 tablet 0   pantoprazole (PROTONIX) 40 MG tablet Take by mouth.     polyethylene glycol (MIRALAX / GLYCOLAX) packet Take 17 g by mouth daily. 14 each 0   sevelamer carbonate (RENVELA) 2.4 g PACK Take 4.8 g by mouth 3 (three) times daily with meals. 90 each    sevelamer carbonate (RENVELA) 2.4 g PACK Take by mouth.     umeclidinium bromide (INCRUSE ELLIPTA) 62.5 MCG/INH AEPB Inhale 1 puff into the lungs daily.     umeclidinium bromide (INCRUSE ELLIPTA) 62.5 MCG/INH AEPB Inhale into the lungs.     No current facility-administered medications on file prior to visit.     Allergies  Allergen Reactions   Ferumoxytol Itching        Iron Itching         Iodinated Diagnostic Agents Itching     Social History   Occupational History   Not on file  Tobacco Use   Smoking status: Former Smoker    Packs/day: 1.00    Years: 15.00    Pack years: 15.00    Types: Cigarettes    Quit date: 09/24/1986    Years since quitting: 33.8   Smokeless tobacco: Never Used  Vaping Use   Vaping Use: Never used  Substance and Sexual Activity   Alcohol use: Not Currently    Comment: stopped in 2013   Drug use: No   Sexual activity: Not Currently     Family History  Problem Relation Age of Onset   Asthma Mother    Hyperlipidemia Mother    Hypertension Mother    Stroke Father    Heart attack Father    Prostate cancer Father    Deep vein thrombosis Father    Cancer Father    Diabetes Father    Hyperlipidemia Father     Hypertension Father    Other Father        varicose veins   Heart disease Father        before age 67   Other Sister        varicose veins     Immunization History  Administered Date(s) Administered   Hepatitis B, adult 05/25/2014, 06/24/2014, 08/02/2014, 01/21/2016, 02/18/2016, 03/20/2016, 07/24/2016, 11/23/2017, 10/29/2019, 11/26/2019, 01/28/2020   Hepb-cpg 03/31/2020   Influenza,inj,Quad PF,6+ Mos 12/02/2013, 06/18/2019   Pneumococcal Conjugate-13 12/02/2013   Pneumococcal Polysaccharide-23 06/28/2018     Objective: Jonathon Bailey is a pleasant 64 y.o. male morbidly obese in NAD. AAO x 3.  There were no vitals filed for this visit.  Vascular Examination:  Capillary refill time to digits <4 seconds 1-5 right. Nonpalpable DP pulse(s) right lower extremity. Nonpalpable PT pulse(s) right lower extremity. Pedal hair absent. Lower extremity skin temperature gradient within normal limits. No pain with calf compression b/l. +1 pitting edema right lower extremity. No ischemia or gangrene noted right lower extremity.  Dermatological Examination: Pedal skin with normal turgor, texture and tone bilaterally. No open wounds RLE.  No interdigital macerations RLE. Toenails 1-5  right lower extremity elongated, discolored, dystrophic, thickened, and crumbly with subungual debris and tenderness to dorsal palpation. Hyperkeratotic lesion submet head 5 and submet head 2 right foot with tenderness to palpation. No edema, no erythema, no drainage, no fluctuance.         He has healing scab on dorsal aspect right midfoot. No erythema, no purulence, no fluctuance.  Musculoskeletal: Muscle strength 5/5 to all LE muscle groups of right lower extremity. No gross  bony deformities RLE.  Utilizes wheelchair for mobility assistance.  Neurological: Pt has subjective symptoms of neuropathy. Protective sensation diminished with 10 gram monofilament right lower extremity. Vibratory  sensation decreased right lower extremity.  ADA FOOT RISK CATEGORIZATION High Risk  Patient has one or more of the following: Loss of protective sensation Absent pedal pulses Severe Foot deformity History of foot ulcer  Assessment: 1. Onychomycosis   2. Callus   3. S/P BKA (below knee amputation) unilateral, left ([place])   4. PAD (peripheral artery disease) ([place])   5. Diabetic peripheral neuropathy associated with type 2 diabetes mellitus ([place])   6. Encounter for diabetic foot exam ([place])     Plan: -Examined patient. -Diabetic foot examination performed on today's visit. -Patient to continue soft, supportive shoe gear daily. -He was instructed to apply antibiotic ointment to healing abrasion once daily.  -We did discuss diabetic shoes for him. He will be receiving his prosthesis for the LLE soon, so we will hold off until he receives his prosthesis. -Toenails 1-5 right debrided in length and girth without iatrogenic bleeding with sterile nail nipper and dremel.  -Patient to report any pedal injuries to medical professional immediately. -Patient/POA to call should there be question/concern in the interim.  Return in about 3 months (around 11/05/2020) for diabetic toenails, corn(s)/callus(es).  Marzetta Board, DPM

## 2020-08-08 ENCOUNTER — Telehealth: Payer: Self-pay

## 2020-08-08 NOTE — Telephone Encounter (Signed)
Per MD, [facility] can d/c patient from their service this Friday. Janett Billow [facility] nurse) aware.

## 2020-09-02 ENCOUNTER — Telehealth: Payer: Self-pay | Admitting: *Deleted

## 2020-09-02 NOTE — Telephone Encounter (Signed)
Clarise Cruz, nurse from [facility] called stating patient had two retained staples in incision from left below knee amputation with one being imbedded. States there is drainage present. Spoke to Dr Donzetta Matters and he instructed that patient come in Monday, 09/05/2020 on PA schedule.  Called patient and gave the appointment date and time.

## 2020-09-05 ENCOUNTER — Other Ambulatory Visit: Payer: Self-pay

## 2020-09-05 ENCOUNTER — Encounter: Payer: Self-pay | Admitting: Physician Assistant

## 2020-09-05 ENCOUNTER — Ambulatory Visit (INDEPENDENT_AMBULATORY_CARE_PROVIDER_SITE_OTHER): Payer: Self-pay | Admitting: Physician Assistant

## 2020-09-05 VITALS — BP 145/91 | HR 89 | Temp 98.3°F | Resp 20 | Ht 72.0 in

## 2020-09-05 DIAGNOSIS — I739 Peripheral vascular disease, unspecified: Secondary | ICD-10-CM

## 2020-09-05 DIAGNOSIS — Z89512 Acquired absence of left leg below knee: Secondary | ICD-10-CM

## 2020-09-05 NOTE — Progress Notes (Signed)
POST OPERATIVE OFFICE NOTE    CC:  F/u for surgery  HPI:  This is a 64 y.o. male who is s/p left below knee amputation by Dr. Donnetta Hutching on 05/02/20. Patient had developed gangrene of his left foot which was not amenable to revascularization options. He has been doing well post operatively. He was last seen several weeks ago by Dr. Donzetta Matters for staple removal. Prior to this visit he had too much edema and dehiscence of the wound to remove the staples. Dr. Donzetta Matters removed the staples he could visualize at the time. He felt he was otherwise doing well and gave him a 6 months follow up and referral to biotech.   He presents today after his home health care nurse called about two retained staples. She also reported some drainage present. He states he is not having any pain. He has been dressing wound with silver alginate from the [facility] wound care and home health has been cleaning and changing dressings for him. He reports minimal drainage.  Allergies  Allergen Reactions  . Ferumoxytol Itching       . Iron Itching        . Iodinated Diagnostic Agents Itching    Current Outpatient Medications  Medication Sig Dispense Refill  . acetaminophen (TYLENOL) 325 MG tablet Take 650 mg by mouth every 6 (six) hours as needed for pain.    Marland Kitchen albuterol (PROVENTIL HFA;VENTOLIN HFA) 108 (90 BASE) MCG/ACT inhaler Inhale 2 puffs into the lungs every 4 (four) hours as needed for wheezing. For wheezing    . aluminum hydroxide (AMPHOJEL/ALTERNAGEL) 320 MG/5ML suspension Take 10 mLs by mouth every 4 (four) hours as needed for indigestion or heartburn. 150 mL 0  . ascorbic acid (VITAMIN C) 1000 MG tablet Take 1 tablet (1,000 mg total) by mouth 2 (two) times daily.    Marland Kitchen aspirin 81 MG chewable tablet Chew 1 tablet (81 mg total) by mouth every other day. 30 tablet 0  . atorvastatin (LIPITOR) 40 MG tablet Take 40 mg by mouth at bedtime.    . B Complex-C-Folic Acid (DIALYVITE 419) 0.8 MG TABS Take 1 tablet by mouth daily.    Marland Kitchen BAYER  ASPIRIN EC LOW DOSE 81 MG EC tablet Take 81 mg by mouth daily.    . carvedilol (COREG) 6.25 MG tablet Take 1 tablet (6.25 mg total) by mouth 2 (two) times daily with a meal.    . cetaphil (CETAPHIL) lotion Apply 1 application topically daily. 236 mL 0  . cetaphil (CETAPHIL) lotion Apply topically.    . cinacalcet (SENSIPAR) 30 MG tablet Take 3 tablets (90 mg total) by mouth daily with supper. 30 tablet 0  . diphenhydrAMINE (BENADRYL) 12.5 MG/5ML elixir Take 5-10 mLs (12.5-25 mg total) by mouth every 6 (six) hours as needed for itching. 120 mL 0  . diphenhydrAMINE (BENADRYL) 12.5 MG/5ML elixir Take by mouth.    . ferrous gluconate (FERGON) 324 MG tablet Take 1 tablet (324 mg total) by mouth 2 (two) times daily with a meal.  3  . fluticasone (FLONASE) 50 MCG/ACT nasal spray Place 2 sprays into both nostrils daily.    Marland Kitchen gabapentin (NEURONTIN) 100 MG capsule Take 1 capsule (100 mg total) by mouth every dialysis. 12 capsule 0  . insulin detemir (LEVEMIR) 100 UNIT/ML injection Inject 0.2 mLs (20 Units total) into the skin at bedtime. 10 mL 11  . melatonin 5 MG TABS Take 1 tablet (5 mg total) by mouth at bedtime.  0  . melatonin  5 MG TABS Take by mouth.    . Methoxy PEG-Epoetin Beta (MIRCERA IJ) Mircera    . Nutritional Supplements (,FEEDING SUPPLEMENT, PROSOURCE PLUS) liquid Take 30 mLs by mouth 2 (two) times daily between meals.    . Nutritional Supplements (,FEEDING SUPPLEMENT, PROSOURCE PLUS) liquid Take by mouth.    . oxyCODONE (OXY IR/ROXICODONE) 5 MG immediate release tablet Take 1 tablet (5 mg total) by mouth daily as needed for severe pain. 5 tablet 0  . oxyCODONE (OXY IR/ROXICODONE) 5 MG immediate release tablet Take by mouth.    . pantoprazole (PROTONIX) 40 MG tablet Take 1 tablet (40 mg total) by mouth daily. 30 tablet 0  . pantoprazole (PROTONIX) 40 MG tablet Take by mouth.    . polyethylene glycol (MIRALAX / GLYCOLAX) packet Take 17 g by mouth daily. 14 each 0  . sevelamer carbonate  (RENVELA) 2.4 g PACK Take 4.8 g by mouth 3 (three) times daily with meals. 90 each   . sevelamer carbonate (RENVELA) 2.4 g PACK Take by mouth.    . umeclidinium bromide (INCRUSE ELLIPTA) 62.5 MCG/INH AEPB Inhale 1 puff into the lungs daily.    Marland Kitchen umeclidinium bromide (INCRUSE ELLIPTA) 62.5 MCG/INH AEPB Inhale into the lungs.     No current facility-administered medications for this visit.     ROS:  See HPI  Physical Exam:  Vitals:   09/05/20 1356  BP: (!) 145/91  Pulse: 89  Resp: 20  Temp: 98.3 F (36.8 C)  TempSrc: Temporal  SpO2: 96%  Height: 6' (1.829 m)   General: well developed and well nourished, not in any distress Incision: left BKA site healing well. Two central staples removed. No drainage Extremities: Bilateral lower extremities well perfused and warm Neuro: alert and oriented  Assessment/Plan:  This is a 64 y.o. male who is s/p left below knee amputation. Two retained staples removed. Amputation site is otherwise is healing well. Continue wound care at [facility] and [facility]. Can follow up with biotech for prosthesis management. He will follow up with Dr. Donzetta Matters in 6 months ( May 2022) with ABIs  Karoline Caldwell, PA-C [facility] [phone]  On call MD:  Dr. Oneida Alar

## 2020-09-07 ENCOUNTER — Other Ambulatory Visit: Payer: Self-pay

## 2020-09-07 DIAGNOSIS — I739 Peripheral vascular disease, unspecified: Secondary | ICD-10-CM

## 2020-10-13 ENCOUNTER — Emergency Department (HOSPITAL_COMMUNITY): Payer: No Typology Code available for payment source

## 2020-10-13 ENCOUNTER — Emergency Department (HOSPITAL_COMMUNITY)
Admission: EM | Admit: 2020-10-13 | Discharge: 2020-10-13 | Disposition: A | Discharge status: Home / Self Care | Payer: No Typology Code available for payment source | Attending: Emergency Medicine | Admitting: Emergency Medicine

## 2020-10-13 DIAGNOSIS — J449 Chronic obstructive pulmonary disease, unspecified: Secondary | ICD-10-CM | POA: Diagnosis not present

## 2020-10-13 DIAGNOSIS — N186 End stage renal disease: Secondary | ICD-10-CM | POA: Diagnosis not present

## 2020-10-13 DIAGNOSIS — Z794 Long term (current) use of insulin: Secondary | ICD-10-CM | POA: Insufficient documentation

## 2020-10-13 DIAGNOSIS — Z87891 Personal history of nicotine dependence: Secondary | ICD-10-CM | POA: Insufficient documentation

## 2020-10-13 DIAGNOSIS — I2510 Atherosclerotic heart disease of native coronary artery without angina pectoris: Secondary | ICD-10-CM | POA: Insufficient documentation

## 2020-10-13 DIAGNOSIS — Z7982 Long term (current) use of aspirin: Secondary | ICD-10-CM | POA: Diagnosis not present

## 2020-10-13 DIAGNOSIS — E11649 Type 2 diabetes mellitus with hypoglycemia without coma: Secondary | ICD-10-CM | POA: Diagnosis not present

## 2020-10-13 DIAGNOSIS — E1122 Type 2 diabetes mellitus with diabetic chronic kidney disease: Secondary | ICD-10-CM | POA: Diagnosis not present

## 2020-10-13 DIAGNOSIS — Z20822 Contact with and (suspected) exposure to covid-19: Secondary | ICD-10-CM | POA: Diagnosis not present

## 2020-10-13 DIAGNOSIS — E1140 Type 2 diabetes mellitus with diabetic neuropathy, unspecified: Secondary | ICD-10-CM | POA: Insufficient documentation

## 2020-10-13 DIAGNOSIS — I5042 Chronic combined systolic (congestive) and diastolic (congestive) heart failure: Secondary | ICD-10-CM | POA: Insufficient documentation

## 2020-10-13 DIAGNOSIS — E11319 Type 2 diabetes mellitus with unspecified diabetic retinopathy without macular edema: Secondary | ICD-10-CM | POA: Diagnosis not present

## 2020-10-13 DIAGNOSIS — I132 Hypertensive heart and chronic kidney disease with heart failure and with stage 5 chronic kidney disease, or end stage renal disease: Secondary | ICD-10-CM | POA: Insufficient documentation

## 2020-10-13 DIAGNOSIS — Z992 Dependence on renal dialysis: Secondary | ICD-10-CM | POA: Insufficient documentation

## 2020-10-13 DIAGNOSIS — R531 Weakness: Secondary | ICD-10-CM

## 2020-10-13 DIAGNOSIS — Z955 Presence of coronary angioplasty implant and graft: Secondary | ICD-10-CM | POA: Diagnosis not present

## 2020-10-13 DIAGNOSIS — M791 Myalgia, unspecified site: Secondary | ICD-10-CM | POA: Diagnosis not present

## 2020-10-13 DIAGNOSIS — J45909 Unspecified asthma, uncomplicated: Secondary | ICD-10-CM | POA: Insufficient documentation

## 2020-10-13 DIAGNOSIS — Z79899 Other long term (current) drug therapy: Secondary | ICD-10-CM | POA: Diagnosis not present

## 2020-10-13 DIAGNOSIS — R4182 Altered mental status, unspecified: Secondary | ICD-10-CM | POA: Diagnosis present

## 2020-10-13 DIAGNOSIS — E162 Hypoglycemia, unspecified: Secondary | ICD-10-CM

## 2020-10-13 LAB — CBC WITH DIFFERENTIAL/PLATELET
Abs Immature Granulocytes: 0.02 10*3/uL (ref 0.00–0.07)
Basophils Absolute: 0.1 10*3/uL (ref 0.0–0.1)
Basophils Relative: 1 %
Eosinophils Absolute: 0.1 10*3/uL (ref 0.0–0.5)
Eosinophils Relative: 2 %
HCT: 37 % — ABNORMAL LOW (ref 39.0–52.0)
Hemoglobin: 11.9 g/dL — ABNORMAL LOW (ref 13.0–17.0)
Immature Granulocytes: 0 %
Lymphocytes Relative: 35 %
Lymphs Abs: 2 10*3/uL (ref 0.7–4.0)
MCH: 24.1 pg — ABNORMAL LOW (ref 26.0–34.0)
MCHC: 32.2 g/dL (ref 30.0–36.0)
MCV: 75.1 fL — ABNORMAL LOW (ref 80.0–100.0)
Monocytes Absolute: 0.7 10*3/uL (ref 0.1–1.0)
Monocytes Relative: 13 %
Neutro Abs: 2.8 10*3/uL (ref 1.7–7.7)
Neutrophils Relative %: 49 %
Platelets: 164 10*3/uL (ref 150–400)
RBC: 4.93 MIL/uL (ref 4.22–5.81)
RDW: 21.4 % — ABNORMAL HIGH (ref 11.5–15.5)
WBC: 5.6 10*3/uL (ref 4.0–10.5)
nRBC: 0 % (ref 0.0–0.2)

## 2020-10-13 LAB — COMPREHENSIVE METABOLIC PANEL
ALT: 28 U/L (ref 0–44)
AST: 33 U/L (ref 15–41)
Albumin: 3.5 g/dL (ref 3.5–5.0)
Alkaline Phosphatase: 84 U/L (ref 38–126)
Anion gap: 15 (ref 5–15)
BUN: 34 mg/dL — ABNORMAL HIGH (ref 8–23)
CO2: 26 mmol/L (ref 22–32)
Calcium: 9.1 mg/dL (ref 8.9–10.3)
Chloride: 96 mmol/L — ABNORMAL LOW (ref 98–111)
Creatinine, Ser: 6.45 mg/dL — ABNORMAL HIGH (ref 0.61–1.24)
GFR, Estimated: 9 mL/min — ABNORMAL LOW (ref >60)
Glucose, Bld: 70 mg/dL (ref 70–99)
Potassium: 4.2 mmol/L (ref 3.5–5.1)
Sodium: 137 mmol/L (ref 135–145)
Total Bilirubin: 1.5 mg/dL — ABNORMAL HIGH (ref 0.3–1.2)
Total Protein: 8.2 g/dL — ABNORMAL HIGH (ref 6.5–8.1)

## 2020-10-13 LAB — PROTIME-INR
INR: 1.4 — ABNORMAL HIGH (ref 0.8–1.2)
Prothrombin Time: 16.3 seconds — ABNORMAL HIGH (ref 11.4–15.2)

## 2020-10-13 LAB — CBG MONITORING, ED
Glucose-Capillary: 68 mg/dL — ABNORMAL LOW (ref 70–99)
Glucose-Capillary: 97 mg/dL (ref 70–99)

## 2020-10-13 LAB — SARS CORONAVIRUS 2 (TAT 6-24 HRS): SARS Coronavirus 2: NEGATIVE

## 2020-10-13 LAB — PHOSPHORUS: Phosphorus: 3.8 mg/dL (ref 2.5–4.6)

## 2020-10-13 LAB — MAGNESIUM: Magnesium: 2.2 mg/dL (ref 1.7–2.4)

## 2020-10-13 NOTE — Discharge Instructions (Signed)
Your generalized weakness may be due to low blood sugar.  Please monitor your blood sugar carefully and eat as appropriate.  A COVID-19 test have been obtained during your visit.  You may check on the result through MyChart, link below in the next 24 hours.  If you test positive for COVID-19, follow instruction below.  Recommendations for at home COVID-19 symptoms management:  Please continue isolation at home. Call [phone] to see whether you might be eligible for therapeutic antibody infusions (leave your name and they will call you back).  If have acute worsening of symptoms please go to ER/urgent care for further evaluation. Check pulse oximetry and if below 90-92% please go to ER. The following supplements MAY help:  Vitamin C '500mg'$  twice a day and Quercetin 250-500 mg twice a day Vitamin D3 2000 - 4000 u/day B Complex vitamins Zinc 75-100 mg/day Melatonin 6-10 mg at night (the optimal dose is unknown) Aspirin '81mg'$ /day (if no history of bleeding issues)

## 2020-10-13 NOTE — ED Notes (Signed)
Juice and crackers given to pt

## 2020-10-13 NOTE — Progress Notes (Signed)
Patient dialyzes at [facility]. His wheelchair is confirmed to be at his clinic currently. [place] staff provided a phone number for patient's son Brooke Bonito: [phone]. Per RN, son has been reached and states he will pick up patient's wheelchair and then come get patient.   Alphonzo Cruise, [place] Renal Navigator [phone]

## 2020-10-13 NOTE — Discharge Planning (Signed)
RNCM consulted Renal Navigator to assist with locating additional contacts for pt.

## 2020-10-13 NOTE — ED Triage Notes (Addendum)
Per ems pt has been altered since 545 this morning at dialysis. States he completed 4 hours. Dialysis nurse states pt was more confused has some R leg and R arm weakness and speech is a little abnormal. Not sure when LKW was. cbg 82, 190/118, 98%.

## 2020-10-13 NOTE — ED Notes (Signed)
Family on way to get pt

## 2020-10-13 NOTE — ED Provider Notes (Signed)
Medical screening examination/treatment/procedure(s) were conducted as a shared visit with non-physician practitioner(s) and myself.  I personally evaluated the patient during the encounter.  EKG Interpretation  Date/Time:  Thursday October 13 2020 11:44:30 EST Ventricular Rate:  97 PR Interval:    QRS Duration: 163 QT Interval:  413 QTC Calculation: 525 R Axis:   163 Text Interpretation: Sinus rhythm Right bundle branch block no sig change from old Confirmed by Charlesetta Shanks [phone]) on 10/14/2020 9:22:25 AM  Patient with history of ESRD on dialysis.  He finished dialysis today.  Patient seemed more confused and drowsy after his session.  Continued to be observed but did not seem to be improving.  He is sent to the emergency department for evaluation.  Patient is denying any acute complaints.  He reports that he does sometimes get drowsy and weak after dialysis sessions.  Patient denies any headache or chest pain.  He has eaten since being in the emergency department.  He reports he feels improved.  At the time in the dialysis center, patient had planed of some right arm and leg weakness.  He no longer notes that he has any of those symptoms.  He does not feel like it is any different than usual.  Patient is alert and oriented.  No sign of confusion.  Heart regular.  Lungs grossly clear.  Patient denies any abdominal pain.  He has a left BKA.  Follows commands for performing grip strength.  He is eating with coordinated fashion.  He can independently elevate both lower extremities off the bed.  Patient reports at baseline he is almost always in a wheelchair.  He does not ambulate.  At this time appears most likely symptoms are due to some degree of hypoglycemia, CBG was at 60 upon arrival.  Diagnostic work-up does not identify specific abnormality.  At this time patient appears appropriate for continued outpatient management.  Return precautions reviewed.   Charlesetta Shanks, MD 10/14/20 [phone]

## 2020-10-13 NOTE — ED Provider Notes (Signed)
[facility] EMERGENCY DEPARTMENT Provider Note   CSN: [id number] Arrival date & time: 10/13/20  1102     History Chief Complaint  Patient presents with  . Altered Mental Status    Jonathon Bailey is a 65 y.o. male.  The history is provided by the patient, the EMS personnel and medical records. No language interpreter was used.  Altered Mental Status Extremity Weakness     65 year old male significant history is an end-stage renal disease currently on Tuesday Thursday Saturday dialysis schedule, diabetes, CHF, depression, prior stroke with right-sided deficit brought here via EMS from dialysis center for altered mental status.  Patient was started at the dialysis approximately 6 hours ago.  Nursing staff noticed that patient appears more altered than usual having trouble speaking, and was having some complaint of right arm and leg weakness.  He did finish a complete 4 hours session but his mental status did not improve prompting this ER visit.  At the scene CBG obtained showing 82, his blood pressure initially was elevated at 190/118 and no hypoxia.  Patient does acknowledge noticing increased weakness to his right upper and lower extremity.  He denies having any active pain no headache no neck pain chest pain trouble breathing coughing runny nose sneezing sore throat loss of taste or smell nausea vomiting diarrhea abdominal pain or dysuria.  He does make some urine.  He has been vaccinated for COVID-19.  Past Medical History:  Diagnosis Date  . Anxiety   . Arthritis   . Asthma    "as a child"  . CAD (coronary artery disease)   . Chronic systolic heart failure ([place]) 09/21/2008   ECHO Feb 2013 showed LVEF low normal at 50-55%, +hypokinetic anterolateral wall and inferolateral wall.    . CKD (chronic kidney disease) stage 4, GFR 15-29 ml/min (HCC) 08/30/2009   Progressive renal failure since 2008, creatinine 1.2 in 2008 up to 3.5 in 2012 and 3.2-5.0 in 2013. All  UA's 2011-13 showed >300 protein on dipstick. Work-up in May 2011 showed negative Urine IFE and SPEP, ultrasound showed 12-13 cm kidneys with increased echogenicity and UPC ratio was 1.5 gm proteinuria.  Hgb A1C's from 2011 to 2013 were all between 9-11.  Patient saw Dr. Donnetta Hutching (vasc surgery) for HD access in Aug 2013 > vein mapping was done and Dr. Donnetta Hutching felt the left arm (pt is R handed) was suitable for L arm Cimino radiocephalic fistula. Patient said he wasn't ready to consider doing dialysis and declined the surgery.     . Depression   . Diabetic retinopathy ([place])   . ESRD (end stage renal disease) on dialysis ([place])   . [id number])    "maybe monthly" (03/15/2014)  . Hyperlipidemia   . Hypertension   . Macular edema   . Myocardial infarction [facility])    status post MI x2 and 3 stents placed in 2003  . Obesity   . Sleep apnea    does not use CPAP  . Stroke [facility]) ~ 2007; ~1987   "weak on right side; messed w/right side of brain; cry all the time"  . Type II diabetes mellitus (HCC)    insulin dependent    Patient Active Problem List   Diagnosis Date Noted  . Moderate protein-calorie malnutrition ([place]) 05/27/2020  . Allergy, unspecified, initial encounter 05/18/2020  . Anaphylactic shock, unspecified, initial encounter 05/18/2020  . ESRD on dialysis ([place])   . Constipation   . Noncompliance   . Abdominal distension   .  Drug induced constipation   . Leukocytosis   . Essential hypertension   . Labile blood pressure   . Controlled type 2 diabetes mellitus with hyperglycemia, with long-term current use of insulin ([place])   . Unilateral complete BKA, left, initial encounter ([place]) 05/06/2020  . Postoperative pain   . Acute on chronic anemia   . Anemia of chronic disease   . S/P BKA (below knee amputation) unilateral, left ([place])   . Sepsis without septic shock ([place]) 04/28/2020  . Wet gangrene ([place]) 04/28/2020  . Fluid overload, unspecified 08/03/2019  . Hyperkalemia 10/21/2018  .  Encounter for removal of sutures 09/22/2018  . Morbid obesity with BMI of 40.0-44.9, adult ([place]) 03/05/2018  . History of completed stroke 02/14/2018  . Unstable angina ([place]) 02/14/2018  . Dependence on renal dialysis ([place]) 11/12/2017  . Chills (without fever) 01/01/2017  . Local infection due to central venous catheter, initial encounter 09/08/2016  . Diabetic retinopathy ([place]) 08/21/2016  . Hypercalcemia 10/25/2014  . Anxiety disorder, unspecified 09/10/2014  . Coagulation defect, unspecified ([place]) 09/10/2014  . Iron deficiency anemia, unspecified 09/10/2014  . Major depressive disorder, single episode, unspecified 09/10/2014  . Pain, unspecified 09/10/2014  . Peritonitis, unspecified ([place]) 09/10/2014  . Pruritus, unspecified 09/10/2014  . Secondary hyperparathyroidism of renal origin ([place]) 09/10/2014  . Unspecified osteoarthritis, unspecified site 09/10/2014  . Other specified fever   . Generalized abdominal pain   . Acute lower GI bleeding   . ESRD (end stage renal disease) ([place])   . Bacterial peritonitis ([place])   . Blood poisoning   . ESRD on peritoneal dialysis ([place])   . Acute blood loss anemia   . Abdominal pain 09/01/2014  . CAPD (continuous ambulatory peritoneal dialysis) status 03/15/2014  . CAD S/P percutaneous coronary angioplasty 02/03/2014  . H/O CHF 02/03/2014  . History of cocaine use 02/03/2014  . S/P coronary artery stent placement 02/03/2014  . Morbid obesity ([place]) 02/01/2014  . 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 Preventative health care 12/02/2013  . Other complications due to renal dialysis device, implant, and graft 10/24/2012  . COPD (chronic obstructive pulmonary disease) ([place]) 10/23/2012  . DIABETIC PERIPHERAL NEUROPATHY 01/09/2010  . Type 2 diabetes mellitus with diabetic neuropathy, unspecified ([place]) 01/09/2010  . ESRD (end stage renal disease) on dialysis ([place]) 08/30/2009  . CEREBROVASCULAR  ACCIDENT, HX OF 08/30/2009  . Cerebral infarction ([place]) 08/30/2009  . Diabetes mellitus type 2 with complications ([place]) Q000111Q  . HYPERLIPIDEMIA 09/21/2008  . OBSTRUCTIVE SLEEP APNEA 09/21/2008  . ESSENTIAL HYPERTENSION 09/21/2008  . Coronary atherosclerosis 09/21/2008  . Chronic combined systolic and diastolic heart failure ([place]) 09/21/2008    Past Surgical History:  Procedure Laterality Date  . ABDOMINAL AORTOGRAM W/LOWER EXTREMITY N/A 04/29/2020   Procedure: ABDOMINAL AORTOGRAM W/LOWER EXTREMITY;  Surgeon: Angelia Mould, MD;  Location: [facility];  Service: Cardiovascular;  Laterality: N/A;  . AMPUTATION Left 05/02/2020   Procedure: AMPUTATION BELOW KNEE;  Surgeon: Rosetta Posner, MD;  Location: [facility];  Service: Vascular;  Laterality: Left;  . AV FISTULA PLACEMENT  10/17/2012   Procedure: ARTERIOVENOUS (AV) FISTULA CREATION;  Surgeon: Mal Misty, MD;  Location: [place] [facility];  Service: Vascular;  Laterality: Left;  . BONE EXOSTOSIS EXCISION Right 06/04/2019   Procedure: EXOSTOSIS EXCISION;  Surgeon: Leanora Cover, MD;  Location: [facility];  Service: Orthopedics;  Laterality: Right;  . CAPD INSERTION N/A 03/15/2014   Procedure: LAPAROSCOPIC INSERTION CONTINUOUS  AMBULATORY PERITONEAL DIALYSIS CATHETER, LAPARASCOPIC INCISIONAL HERNIA  REPAIR  WITH MESH, OMENTOPEXY AND LYSIS OF ADHESIONS;  Surgeon: [name], MD;  Location: [facility];  Service: General;  Laterality: N/A;  . CORONARY ANGIOPLASTY WITH STENT PLACEMENT  ~ 2002   "3"  . CORONARY ANGIOPLASTY WITH STENT PLACEMENT  01/24/2004   successful PCI/stenting RCA  drug eluting cypher stent  . CORONARY ANGIOPLASTY WITH STENT PLACEMENT  01/28/2004   successful stentin of a large bifurcation marginal branch of the ramus intermediate vessel  . FLEXIBLE SIGMOIDOSCOPY N/A 09/05/2014   Procedure: FLEXIBLE SIGMOIDOSCOPY;  Surgeon: Cleotis Nipper, MD;  Location: [facility];  Service: Endoscopy;  Laterality: N/A;  .  HERNIA REPAIR    . INCISION AND DRAINAGE ABSCESS N/A 09/06/2014   Procedure: REMOVAL OF PD CATH;  Surgeon: Coralie Keens, MD;  Location: [facility];  Service: General;  Laterality: N/A;  . INCISIONAL HERNIA REPAIR  03/15/2014  . LAPAROSCOPIC LYSIS OF ADHESIONS  03/15/2014  . NM MYOCAR PERF WALL MOTION  02/06/2012   normal perfusion scan  . PERITONEAL CATHETER INSERTION  03/15/2014  . REFRACTIVE SURGERY Bilateral   . SHUNTOGRAM N/A 03/11/2013   Procedure: Fistulogram;  Surgeon: Conrad Alhambra, MD;  Location: [facility] CATH LAB;  Service: Cardiovascular;  Laterality: N/A;  . TENDON TRANSFER Right 06/04/2019   Procedure: RIGHT HAND EXTENSOR TENDON TRANSFER TO SMALL FINGER;  Surgeon: Leanora Cover, MD;  Location: [facility];  Service: Orthopedics;  Laterality: Right;  . UMBILICAL HERNIA REPAIR         Family History  Problem Relation Age of Onset  . Asthma Mother   . Hyperlipidemia Mother   . Hypertension Mother   . Stroke Father   . Heart attack Father   . Prostate cancer Father   . Deep vein thrombosis Father   . Cancer Father   . Diabetes Father   . Hyperlipidemia Father   . Hypertension Father   . Other Father        varicose veins  . Heart disease Father        before age 51  . Other Sister        varicose veins    Social History   Tobacco Use  . Smoking status: Former Smoker    Packs/day: 1.00    Years: 15.00    Pack years: 15.00    Types: Cigarettes    Quit date: 09/24/1986    Years since quitting: 34.0  . Smokeless tobacco: Never Used  Vaping Use  . Vaping Use: Never used  Substance Use Topics  . Alcohol use: Not Currently    Comment: stopped in 2013  . Drug use: No    Home Medications Prior to Admission medications   Medication Sig Start Date End Date Taking? Authorizing Provider  albuterol (PROVENTIL HFA;VENTOLIN HFA) 108 (90 BASE) MCG/ACT inhaler Inhale 2 puffs into the lungs every 4 (four) hours as needed for wheezing. For wheezing    [provider]  aluminum hydroxide (AMPHOJEL/ALTERNAGEL) 320 MG/5ML suspension Take 10 mLs by mouth every 4 (four) hours as needed for indigestion or heartburn. 05/27/20   Love, Ivan Anchors, PA-C  ascorbic acid (VITAMIN C) 1000 MG tablet Take 1 tablet (1,000 mg total) by mouth 2 (two) times daily. 05/27/20   Love, Ivan Anchors, PA-C  aspirin 81 MG chewable tablet Chew 1 tablet (81 mg total) by mouth every other day. 05/07/20   Simmons-Robinson, [place]iera, MD  atorvastatin (LIPITOR) 40 MG tablet  Take 40 mg by mouth at bedtime.    [provider]  B Complex-C-Folic Acid (DIALYVITE Q000111Q) 0.8 MG TABS Take 1 tablet by mouth daily. 12/01/19   [provider]  BAYER ASPIRIN EC LOW DOSE 81 MG EC tablet Take 81 mg by mouth daily. 05/27/20   [provider]  carvedilol (COREG) 6.25 MG tablet Take 1 tablet (6.25 mg total) by mouth 2 (two) times daily with a meal. 05/27/20   Love, Ivan Anchors, PA-C  cetaphil (CETAPHIL) lotion Apply 1 application topically daily. 05/27/20   Love, Ivan Anchors, PA-C  cetaphil (CETAPHIL) lotion Apply topically. 05/27/20   [provider]  cinacalcet (SENSIPAR) 30 MG tablet Take 3 tablets (90 mg total) by mouth daily with supper. 05/06/20   Simmons-Robinson, Riki Sheer, MD  diphenhydrAMINE (BENADRYL) 12.5 MG/5ML elixir Take 5-10 mLs (12.5-25 mg total) by mouth every 6 (six) hours as needed for itching. 05/27/20   Love, Ivan Anchors, PA-C  diphenhydrAMINE (BENADRYL) 12.5 MG/5ML elixir Take by mouth. 05/27/20   [provider]  ferrous gluconate (FERGON) 324 MG tablet Take 1 tablet (324 mg total) by mouth 2 (two) times daily with a meal. 05/27/20   Love, Ivan Anchors, PA-C  fluticasone (FLONASE) 50 MCG/ACT nasal spray Place 2 sprays into both nostrils daily.    [provider]  gabapentin (NEURONTIN) 100 MG capsule Take 1 capsule (100 mg total) by mouth every dialysis. 07/04/20   Jamse Arn, MD  insulin detemir (LEVEMIR) 100 UNIT/ML injection Inject 0.2 mLs (20 Units total)  into the skin at bedtime. 05/06/20   Zenia Resides, MD  melatonin 5 MG TABS Take 1 tablet (5 mg total) by mouth at bedtime. 05/27/20   Love, Ivan Anchors, PA-C  melatonin 5 MG TABS Take by mouth. 05/27/20   [provider]  Methoxy PEG-Epoetin Beta (MIRCERA IJ) Mircera 06/28/20 06/27/21  [provider]  Nutritional Supplements (,FEEDING SUPPLEMENT, PROSOURCE PLUS) liquid Take 30 mLs by mouth 2 (two) times daily between meals. 05/27/20   Love, Ivan Anchors, PA-C  Nutritional Supplements (,FEEDING SUPPLEMENT, PROSOURCE PLUS) liquid Take by mouth. 05/27/20   [provider]  oxyCODONE (OXY IR/ROXICODONE) 5 MG immediate release tablet Take 1 tablet (5 mg total) by mouth daily as needed for severe pain. 05/27/20   Love, Ivan Anchors, PA-C  oxyCODONE (OXY IR/ROXICODONE) 5 MG immediate release tablet Take by mouth. 05/27/20   [provider]  pantoprazole (PROTONIX) 40 MG tablet Take 1 tablet (40 mg total) by mouth daily. 05/07/20   Simmons-Robinson, [place], MD  pantoprazole (PROTONIX) 40 MG tablet Take by mouth. 05/27/20   [provider]  polyethylene glycol (MIRALAX / GLYCOLAX) packet Take 17 g by mouth daily. 09/08/14   Janora Norlander, DO  sevelamer carbonate (RENVELA) 2.4 g PACK Take 4.8 g by mouth 3 (three) times daily with meals. 05/27/20   Love, Ivan Anchors, PA-C  sevelamer carbonate (RENVELA) 2.4 g PACK Take by mouth. 11/25/18   [provider]  umeclidinium bromide (INCRUSE ELLIPTA) 62.5 MCG/INH AEPB Inhale 1 puff into the lungs daily. 05/28/20   Love, Ivan Anchors, PA-C  umeclidinium bromide (INCRUSE ELLIPTA) 62.5 MCG/INH AEPB Inhale into the lungs. 05/27/20   [provider]    Allergies    Ferumoxytol, Iron, and Iodinated diagnostic agents  Review of Systems   Review of Systems  Musculoskeletal: Positive for extremity weakness.  All other systems reviewed and are negative.   Physical Exam Updated Vital Signs BP (!) 153/101  Pulse 95   Temp 98 F (36.7  C) (Oral)   Resp (!) 21   Wt 129.3 kg   SpO2 95%   BMI 38.65 kg/m   Physical Exam Vitals and nursing note reviewed.  Constitutional:      General: He is not in acute distress.    Appearance: He is well-developed and well-nourished.  HENT:     Head: Normocephalic and atraumatic.     Mouth/Throat:     Mouth: Mucous membranes are dry.  Eyes:     General: No visual field deficit.    Extraocular Movements: Extraocular movements intact.     Conjunctiva/sclera: Conjunctivae normal.     Pupils: Pupils are equal, round, and reactive to light.  Cardiovascular:     Rate and Rhythm: Normal rate and regular rhythm.     Pulses: Normal pulses.     Heart sounds: Normal heart sounds.  Pulmonary:     Effort: Pulmonary effort is normal.     Breath sounds: Normal breath sounds.  Abdominal:     Palpations: Abdomen is soft.     Tenderness: There is no abdominal tenderness.  Musculoskeletal:     Cervical back: Neck supple. No rigidity.     Comments: Left BKA with a dressing in place.  Dressing removed no signs of infection.  Skin:    Findings: No rash.  Neurological:     Mental Status: He is alert and oriented to person, place, and time.     GCS: GCS eye subscore is 4. GCS verbal subscore is 4. GCS motor subscore is 6.     Cranial Nerves: Dysarthria present. No facial asymmetry.     Sensory: Sensation is intact.     Motor: Weakness and tremor present. No pronator drift.     Coordination: Finger-Nose-Finger Test abnormal.     Comments: 4 out of 5 strength to right upper extremity, 5 out of 5 strength to left upper extremity 4 out of 5 strength to right lower extremity, 5 out of 5 strength to left lower extremity  Psychiatric:        Mood and Affect: Mood and affect normal.     ED Results / Procedures / Treatments   Labs (all labs ordered are listed, but only abnormal results are displayed) Labs Reviewed  COMPREHENSIVE METABOLIC PANEL - Abnormal; Notable for the following components:       Result Value   Chloride 96 (*)    BUN 34 (*)    Creatinine, Ser 6.45 (*)    Total Protein 8.2 (*)    Total Bilirubin 1.5 (*)    GFR, Estimated 9 (*)    All other components within normal limits  CBC WITH DIFFERENTIAL/PLATELET - Abnormal; Notable for the following components:   Hemoglobin 11.9 (*)    HCT 37.0 (*)    MCV 75.1 (*)    MCH 24.1 (*)    RDW 21.4 (*)    All other components within normal limits  PROTIME-INR - Abnormal; Notable for the following components:   Prothrombin Time 16.3 (*)    INR 1.4 (*)    All other components within normal limits  CBG MONITORING, ED - Abnormal; Notable for the following components:   Glucose-Capillary 68 (*)    All other components within normal limits  SARS CORONAVIRUS 2 (TAT 6-24 HRS)  MAGNESIUM  PHOSPHORUS  ETHANOL  URINALYSIS, ROUTINE W REFLEX MICROSCOPIC  RAPID URINE DRUG SCREEN, HOSP PERFORMED  CBG MONITORING, ED    EKG None  Radiology CT Head Wo Contrast  Result Date: 10/13/2020 CLINICAL DATA:  Altered mental status EXAM: CT HEAD WITHOUT CONTRAST TECHNIQUE: Contiguous axial images were obtained from the base of the skull through the vertex without intravenous contrast. COMPARISON:  Multiple priors including head CT May 01, 2020 and MRI brain May 19, 2007 FINDINGS: Brain: No evidence of acute infarction, hemorrhage, hydrocephalus, extra-axial collection or mass lesion/mass effect. Similar mild global parenchymal volume loss. Ill-defined focus of hypoattenuation within the paramedian right occipital lobe, unchanged from prior and consistent with prior infarction. Scattered subcortical and periventricular hypodensities is nonspecific, but consistent with chronic small vessel ischemic disease and is unchanged. Vascular: No hyperdense vessel or unexpected calcification. Atherosclerotic calcifications. Skull: Normal. Negative for fracture or focal lesion. Sinuses/Orbits: Mucosal thickening of the maxillary sinuses. Mastoid air cells  are clear. Other: None IMPRESSION: 1. No acute intracranial findings. 2. Stable mild chronic small vessel white matter ischemic disease and parenchymal volume loss. 3. Unchanged chronic cortically based right paramedian occipital lobe infarct. Electronically Signed   By: [name] MD   On: 10/13/2020 13:05   DG Chest Portable 1 View  Result Date: 10/13/2020 CLINICAL DATA:  Altered mental status.  Weakness. EXAM: PORTABLE CHEST 1 VIEW COMPARISON:  August 14, 21. FINDINGS: Diffuse interstitial prominence. No confluent consolidation. Similar streaky opacities at the left lung base, likely atelectasis/scar. No visible pleural effusions or pneumothorax. Enlarged cardiac silhouette, which is increased from prior and has a somewhat globular appearance. Left IJ approach central venous catheter with the tip projecting at the right atrium. No acute osseous abnormality. IMPRESSION: 1. Enlargement of the cardiac silhouette, which is increased from prior has a somewhat globular appearance. While this is likely in part secondary to AP portable technique, a pericardial effusion is not excluded. An echocardiogram could further evaluate. 2. Diffuse interstitial prominence, suggestive of mild interstitial edema or atypical infection. Electronically Signed   By: Margaretha Sheffield MD   On: 10/13/2020 14:41    Procedures Procedures (including critical care time)  Medications Ordered in ED Medications - No data to display  ED Course  I have reviewed the triage vital signs and the nursing notes.  Pertinent labs & imaging results that were available during my care of the patient were reviewed by me and considered in my medical decision making (see chart for details).    MDM Rules/Calculators/A&P                          BP (!) 153/101   Pulse 95   Temp 98 F (36.7 C) (Oral)   Resp (!) 21   Wt 129.3 kg   SpO2 95%   BMI 38.65 kg/m   Final Clinical Impression(s) / ED Diagnoses Final diagnoses:  General  weakness  Myalgia  Hypoglycemia    Rx / DC Orders ED Discharge Orders    None     11:30 AM Patient with history of prior stroke with right-sided residual weakness brought to the ER from dialysis center due to altered mental status, unknown last known normal but likely last night.  Primary changes include dysarthria, and worsening right-sided weakness.  No recent sickness, no COVID symptoms, finish a complete dialysis session here today.  Did eat breakfast, initial CBG 82.  Does make some urine denies UTI symptoms.  Repeat CBG is 68, will give juice and crackers, labs are currently pending. Suspect hypoglycemia may contribute to his altered mental status.  2:36 PM After juice and  crackers, CBG improved to 97.  On reexamination patient appears more alert and oriented.  He admits that he is having some generalized weakness and focal weakness.  He does have weakness to his right arm but attributed to a right shoulder injury in the past and denies worsening weakness on the right side.  I suspect his altered mental status likely secondary to hypoglycemia.  I discussed care with Dr. Vallery Ridge.  We will give patient a meal.  3:15 PM Dr. Johnney Killian have evaluated pt and agrees pt can be discharge home.  Due to gen weakness and body aches, covid-19 test ordered.  Result can be f/u through MyChart.  Pt otherwise stable for discharge.  Return precaution given.   Jonathon Bailey was evaluated in Emergency Department on 10/13/2020 for the symptoms described in the history of present illness. He was evaluated in the context of the global COVID-19 pandemic, which necessitated consideration that the patient might be at risk for infection with the SARS-CoV-2 virus that causes COVID-19. Institutional protocols and algorithms that pertain to the evaluation of patients at risk for COVID-19 are in a state of rapid change based on information released by regulatory bodies including the CDC and federal and state  organizations. These policies and algorithms were followed during the patient's care in the ED.    Domenic Moras, PA-C 10/13/20 [place], MD 10/14/20 [phone]

## 2020-11-11 ENCOUNTER — Ambulatory Visit (INDEPENDENT_AMBULATORY_CARE_PROVIDER_SITE_OTHER): Payer: Medicare Other | Admitting: Podiatry

## 2020-11-11 ENCOUNTER — Encounter: Payer: Self-pay | Admitting: Podiatry

## 2020-11-11 ENCOUNTER — Other Ambulatory Visit: Payer: Self-pay

## 2020-11-11 DIAGNOSIS — E1142 Type 2 diabetes mellitus with diabetic polyneuropathy: Secondary | ICD-10-CM

## 2020-11-11 DIAGNOSIS — I739 Peripheral vascular disease, unspecified: Secondary | ICD-10-CM | POA: Diagnosis not present

## 2020-11-11 DIAGNOSIS — B351 Tinea unguium: Secondary | ICD-10-CM | POA: Diagnosis not present

## 2020-11-11 DIAGNOSIS — Z89512 Acquired absence of left leg below knee: Secondary | ICD-10-CM

## 2020-11-17 NOTE — Progress Notes (Signed)
Subjective: Jonathon Bailey presents todayat risk foot care. 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   He is on hemodialysis on TTS.  Mr. Gopalan has h/o below knee amputation of the LLE in August 2021.  He is also followed by [facility] . He states he has his toenails cut there last month.   Allergies  Allergen Reactions  . Ferumoxytol Itching       . Iron Itching        . Iodinated Diagnostic Agents Itching  . Sildenafil     Other reaction(s): Abnormal sexual function     Objective: DASMOND WINROW is a pleasant 65 y.o. male morbidly obese in NAD. AAO x 3.  There were no vitals filed for this visit.  Vascular Examination:  Capillary refill time to digits <4 seconds 1-5 right. Nonpalpable DP pulse(s) right lower extremity. Nonpalpable PT pulse(s) right lower extremity. Pedal hair absent. Lower extremity skin temperature gradient within normal limits. No pain with calf compression b/l. +1 pitting edema right lower extremity. No ischemia or gangrene noted right lower extremity.  Dermatological Examination: Pedal skin with normal turgor, texture and tone bilaterally. No open wounds RLE.  No interdigital macerations RLE. Toenails 1-5  right lower extremity elongated, discolored, dystrophic, thickened, and crumbly with subungual debris and tenderness to dorsal palpation. Hyperkeratotic lesion submet head 5 and submet head 2 right foot with tenderness to palpation. No edema, no erythema, no drainage, no fluctuance.  He has dried heme noted on dorsal aspect of right 2nd, 3rd and 4th digits. No signs of ischemia or gangrene. No signs of infection present.  Musculoskeletal: Muscle strength 5/5 to all LE muscle groups of right lower extremity. No gross bony deformities RLE.  Utilizes wheelchair for mobility assistance.  Neurological: Pt has subjective symptoms of neuropathy. Protective sensation diminished with 10 gram monofilament  right lower extremity. Vibratory sensation decreased right lower extremity.  Assessment: 1. Onychomycosis   2. S/P BKA (below knee amputation) unilateral, left ([place])   3. PAD (peripheral artery disease) ([place])   4. Diabetic peripheral neuropathy associated with type 2 diabetes mellitus ([place])     Plan: -Examined patient. -Patient to continue soft, supportive shoe gear daily. -He was instructed to apply antibiotic ointment to healing abrasion once daily.  -Toenails 1-5 right debrided in length and girth without iatrogenic bleeding with sterile nail nipper and dremel.  -Patient to report any pedal injuries to medical professional immediately. -Patient/POA to call should there be question/concern in the interim.  Return in about 3 months (around 02/08/2021).  Marzetta Board, DPM

## 2020-12-03 ENCOUNTER — Emergency Department (HOSPITAL_COMMUNITY): Payer: No Typology Code available for payment source

## 2020-12-03 ENCOUNTER — Encounter (HOSPITAL_COMMUNITY): Payer: Self-pay | Admitting: Emergency Medicine

## 2020-12-03 ENCOUNTER — Emergency Department (HOSPITAL_COMMUNITY)
Admission: EM | Admit: 2020-12-03 | Discharge: 2020-12-03 | Disposition: A | Discharge status: Home / Self Care | Payer: No Typology Code available for payment source | Attending: Emergency Medicine | Admitting: Emergency Medicine

## 2020-12-03 ENCOUNTER — Other Ambulatory Visit: Payer: Self-pay

## 2020-12-03 DIAGNOSIS — K5900 Constipation, unspecified: Secondary | ICD-10-CM

## 2020-12-03 DIAGNOSIS — Z87891 Personal history of nicotine dependence: Secondary | ICD-10-CM | POA: Diagnosis not present

## 2020-12-03 DIAGNOSIS — N186 End stage renal disease: Secondary | ICD-10-CM | POA: Diagnosis not present

## 2020-12-03 DIAGNOSIS — Z951 Presence of aortocoronary bypass graft: Secondary | ICD-10-CM | POA: Insufficient documentation

## 2020-12-03 DIAGNOSIS — Z7982 Long term (current) use of aspirin: Secondary | ICD-10-CM | POA: Diagnosis not present

## 2020-12-03 DIAGNOSIS — Z992 Dependence on renal dialysis: Secondary | ICD-10-CM | POA: Diagnosis not present

## 2020-12-03 DIAGNOSIS — Z7951 Long term (current) use of inhaled steroids: Secondary | ICD-10-CM | POA: Diagnosis not present

## 2020-12-03 DIAGNOSIS — J45909 Unspecified asthma, uncomplicated: Secondary | ICD-10-CM | POA: Diagnosis not present

## 2020-12-03 DIAGNOSIS — J449 Chronic obstructive pulmonary disease, unspecified: Secondary | ICD-10-CM | POA: Insufficient documentation

## 2020-12-03 DIAGNOSIS — K219 Gastro-esophageal reflux disease without esophagitis: Secondary | ICD-10-CM | POA: Insufficient documentation

## 2020-12-03 DIAGNOSIS — Z79899 Other long term (current) drug therapy: Secondary | ICD-10-CM | POA: Diagnosis not present

## 2020-12-03 DIAGNOSIS — I2510 Atherosclerotic heart disease of native coronary artery without angina pectoris: Secondary | ICD-10-CM | POA: Diagnosis not present

## 2020-12-03 DIAGNOSIS — R1013 Epigastric pain: Secondary | ICD-10-CM

## 2020-12-03 DIAGNOSIS — E1122 Type 2 diabetes mellitus with diabetic chronic kidney disease: Secondary | ICD-10-CM | POA: Diagnosis not present

## 2020-12-03 DIAGNOSIS — Z794 Long term (current) use of insulin: Secondary | ICD-10-CM | POA: Diagnosis not present

## 2020-12-03 DIAGNOSIS — R140 Abdominal distension (gaseous): Secondary | ICD-10-CM

## 2020-12-03 DIAGNOSIS — E1140 Type 2 diabetes mellitus with diabetic neuropathy, unspecified: Secondary | ICD-10-CM | POA: Diagnosis not present

## 2020-12-03 DIAGNOSIS — I132 Hypertensive heart and chronic kidney disease with heart failure and with stage 5 chronic kidney disease, or end stage renal disease: Secondary | ICD-10-CM | POA: Insufficient documentation

## 2020-12-03 DIAGNOSIS — I5042 Chronic combined systolic (congestive) and diastolic (congestive) heart failure: Secondary | ICD-10-CM | POA: Diagnosis not present

## 2020-12-03 LAB — COMPREHENSIVE METABOLIC PANEL
ALT: 11 U/L (ref 0–44)
AST: 16 U/L (ref 15–41)
Albumin: 3.3 g/dL — ABNORMAL LOW (ref 3.5–5.0)
Alkaline Phosphatase: 111 U/L (ref 38–126)
Anion gap: 12 (ref 5–15)
BUN: 18 mg/dL (ref 8–23)
CO2: 27 mmol/L (ref 22–32)
Calcium: 8.9 mg/dL (ref 8.9–10.3)
Chloride: 94 mmol/L — ABNORMAL LOW (ref 98–111)
Creatinine, Ser: 5.93 mg/dL — ABNORMAL HIGH (ref 0.61–1.24)
GFR, Estimated: 10 mL/min — ABNORMAL LOW (ref >60)
Glucose, Bld: 85 mg/dL (ref 70–99)
Potassium: 3.9 mmol/L (ref 3.5–5.1)
Sodium: 133 mmol/L — ABNORMAL LOW (ref 135–145)
Total Bilirubin: 1.1 mg/dL (ref 0.3–1.2)
Total Protein: 7.7 g/dL (ref 6.5–8.1)

## 2020-12-03 LAB — CBC
HCT: 37.5 % — ABNORMAL LOW (ref 39.0–52.0)
Hemoglobin: 12.1 g/dL — ABNORMAL LOW (ref 13.0–17.0)
MCH: 25.7 pg — ABNORMAL LOW (ref 26.0–34.0)
MCHC: 32.3 g/dL (ref 30.0–36.0)
MCV: 79.8 fL — ABNORMAL LOW (ref 80.0–100.0)
Platelets: 135 10*3/uL — ABNORMAL LOW (ref 150–400)
RBC: 4.7 MIL/uL (ref 4.22–5.81)
RDW: 21.7 % — ABNORMAL HIGH (ref 11.5–15.5)
WBC: 4.6 10*3/uL (ref 4.0–10.5)
nRBC: 0 % (ref 0.0–0.2)

## 2020-12-03 LAB — LIPASE, BLOOD: Lipase: 30 U/L (ref 11–51)

## 2020-12-03 LAB — TROPONIN I (HIGH SENSITIVITY)
Troponin I (High Sensitivity): 43 ng/L — ABNORMAL HIGH (ref <18)
Troponin I (High Sensitivity): 44 ng/L — ABNORMAL HIGH (ref <18)

## 2020-12-03 MED ORDER — BARIUM SULFATE 2.1 % PO SUSP
ORAL | Status: AC
  Filled 2020-12-03: qty 2

## 2020-12-03 NOTE — Discharge Instructions (Addendum)
1.  You have been prescribed Protonix previously.  You have a 40 mg dose that you should be taking daily.  Take this medication 30 minutes before you eat in the morning.  Follow the instructions for dietary instructions for gastroesophageal reflux disease.  This should help with your sensation of bloating and food coming back up in your esophagus. 2.  You describe frequent constipation.  Your medications include dulcosate, which you should be taking daily.  You should also be taking MiraLAX daily.  Follow instructions for mixing with water or other liquid.  If you still are significantly constipated, your physician has given you a prescription for lactulose.  This may be taken as per prescription instructions if needed.  This is not meant to be used as a daily medication for you. 3.  Make an appointment to see your doctor for recheck next week.  If you are still having symptoms, you may also need to get scheduled for an endoscopy.  This is where a lighted camera is used to examine your esophagus and stomach. 4.  You were able to eat a meal in the emergency department without vomiting, but then did not complete the CT scan due to feeling symptoms of reflux.  It is important that you see your doctor to reschedule to do a scan.  Be sure to follow the instructions for reflux disease and only eat small amounts at a time.  Eat low-fat snacks.  Take your Protonix every day.

## 2020-12-03 NOTE — ED Provider Notes (Signed)
[facility] Provider Note   CSN: [id number] Arrival date & time: 12/03/20  1021     History Chief Complaint  Patient presents with  . Abdominal Pain  . Chest Pain    Jonathon Bailey is a 65 y.o. male.  HPI Patient reports that he is feeling constipated. He reports that he is having a bowel movement every 2 days but he has to take several medications to have a bowel movement.  We reviewed the medications and he has ducosate prescribed to take daily, MiraLAX for daily use and 2 empty bottles of lactulose for as needed use.  It seems he is primarily taking the lactulose and not regularly taking the ducosate or MiraLAX.  Patient reports he is also getting upper abdominal bloating with eating.  He reports if he even takes a few bites of food, he sometimes feels like his abdomen is really distended and cramping.  He is not having any vomiting.  He reports that he has not eaten this morning.  He ate [place] stew for dinner last night.  He did not have any vomiting.  Patient went to dialysis this morning he reports he completed his session.  He states he still feels slightly short of breath.  He has not had fevers.  He reports he has had basically minor cough for about a month.  No fevers no chills.  No chest pain.    Past Medical History:  Diagnosis Date  . Anxiety   . Arthritis   . Asthma    "as a child"  . CAD (coronary artery disease)   . Chronic systolic heart failure ([place]) 09/21/2008   ECHO Feb 2013 showed LVEF low normal at 50-55%, +hypokinetic anterolateral wall and inferolateral wall.    . CKD (chronic kidney disease) stage 4, GFR 15-29 ml/min (HCC) 08/30/2009   Progressive renal failure since 2008, creatinine 1.2 in 2008 up to 3.5 in 2012 and 3.2-5.0 in 2013. All UA's 2011-13 showed >300 protein on dipstick. Work-up in May 2011 showed negative Urine IFE and SPEP, ultrasound showed 12-13 cm kidneys with increased echogenicity and UPC ratio  was 1.5 gm proteinuria.  Hgb A1C's from 2011 to 2013 were all between 9-11.  Patient saw Dr. Donnetta Hutching (vasc surgery) for HD access in Aug 2013 > vein mapping was done and Dr. Donnetta Hutching felt the left arm (pt is R handed) was suitable for L arm Cimino radiocephalic fistula. Patient said he wasn't ready to consider doing dialysis and declined the surgery.     . Depression   . Diabetic retinopathy ([place])   . ESRD (end stage renal disease) on dialysis ([place])   . [id number])    "maybe monthly" (03/15/2014)  . Hyperlipidemia   . Hypertension   . Macular edema   . Myocardial infarction [facility])    status post MI x2 and 3 stents placed in 2003  . Obesity   . Sleep apnea    does not use CPAP  . Stroke [facility]) ~ 2007; ~1987   "weak on right side; messed w/right side of brain; cry all the time"  . Type II diabetes mellitus (HCC)    insulin dependent    Patient Active Problem List   Diagnosis Date Noted  . Moderate protein-calorie malnutrition ([place]) 05/27/2020  . Allergy, unspecified, initial encounter 05/18/2020  . Anaphylactic shock, unspecified, initial encounter 05/18/2020  . ESRD on dialysis ([place])   . Constipation   . Noncompliance   . Abdominal distension   .  Drug induced constipation   . Leukocytosis   . Essential hypertension   . Labile blood pressure   . Controlled type 2 diabetes mellitus with hyperglycemia, with long-term current use of insulin ([place])   . Unilateral complete BKA, left, initial encounter ([place]) 05/06/2020  . Postoperative pain   . Acute on chronic anemia   . Anemia of chronic disease   . S/P BKA (below knee amputation) unilateral, left ([place])   . Sepsis without septic shock ([place]) 04/28/2020  . Wet gangrene ([place]) 04/28/2020  . Fluid overload, unspecified 08/03/2019  . Hyperkalemia 10/21/2018  . Encounter for removal of sutures 09/22/2018  . Morbid obesity with BMI of 40.0-44.9, adult ([place]) 03/05/2018  . History of completed stroke 02/14/2018  . Unstable angina ([place])  02/14/2018  . Dependence on renal dialysis ([place]) 11/12/2017  . Chills (without fever) 01/01/2017  . Local infection due to central venous catheter, initial encounter 09/08/2016  . Diabetic retinopathy ([place]) 08/21/2016  . Hypercalcemia 10/25/2014  . Anxiety disorder, unspecified 09/10/2014  . Coagulation defect, unspecified ([place]) 09/10/2014  . Iron deficiency anemia, unspecified 09/10/2014  . Major depressive disorder, single episode, unspecified 09/10/2014  . Pain, unspecified 09/10/2014  . Peritonitis, unspecified ([place]) 09/10/2014  . Pruritus, unspecified 09/10/2014  . Secondary hyperparathyroidism of renal origin ([place]) 09/10/2014  . Unspecified osteoarthritis, unspecified site 09/10/2014  . Other specified fever   . Generalized abdominal pain   . Acute lower GI bleeding   . ESRD (end stage renal disease) ([place])   . Bacterial peritonitis ([place])   . Blood poisoning   . ESRD on peritoneal dialysis ([place])   . Acute blood loss anemia   . Abdominal pain 09/01/2014  . CAPD (continuous ambulatory peritoneal dialysis) status 03/15/2014  . CAD S/P percutaneous coronary angioplasty 02/03/2014  . H/O CHF 02/03/2014  . History of cocaine use 02/03/2014  . S/P coronary artery stent placement 02/03/2014  . Morbid obesity ([place]) 02/01/2014  . 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 Preventative health care 12/02/2013  . Other complications due to renal dialysis device, implant, and graft 10/24/2012  . COPD (chronic obstructive pulmonary disease) ([place]) 10/23/2012  . DIABETIC PERIPHERAL NEUROPATHY 01/09/2010  . Type 2 diabetes mellitus with diabetic neuropathy, unspecified ([place]) 01/09/2010  . ESRD (end stage renal disease) on dialysis ([place]) 08/30/2009  . CEREBROVASCULAR ACCIDENT, HX OF 08/30/2009  . Cerebral infarction ([place]) 08/30/2009  . Diabetes mellitus type 2 with complications ([place]) [id number]  . HYPERLIPIDEMIA 09/21/2008  .  OBSTRUCTIVE SLEEP APNEA 09/21/2008  . ESSENTIAL HYPERTENSION 09/21/2008  . Coronary atherosclerosis 09/21/2008  . Chronic combined systolic and diastolic heart failure ([place]) 09/21/2008    Past Surgical History:  Procedure Laterality Date  . ABDOMINAL AORTOGRAM W/LOWER EXTREMITY N/A 04/29/2020   Procedure: ABDOMINAL AORTOGRAM W/LOWER EXTREMITY;  Surgeon: Angelia Mould, MD;  Location: [facility] CV LAB;  Service: Cardiovascular;  Laterality: N/A;  . AMPUTATION Left 05/02/2020   Procedure: AMPUTATION BELOW KNEE;  Surgeon: Rosetta Posner, MD;  Location: [facility];  Service: Vascular;  Laterality: Left;  . AV FISTULA PLACEMENT  10/17/2012   Procedure: ARTERIOVENOUS (AV) FISTULA CREATION;  Surgeon: Mal Misty, MD;  Location: [facility];  Service: Vascular;  Laterality: Left;  . BONE EXOSTOSIS EXCISION Right 06/04/2019   Procedure: EXOSTOSIS EXCISION;  Surgeon: Leanora Cover, MD;  Location: [facility];  Service: Orthopedics;  Laterality: Right;  . CAPD INSERTION N/A 03/15/2014   Procedure: LAPAROSCOPIC INSERTION CONTINUOUS  AMBULATORY PERITONEAL DIALYSIS CATHETER, LAPARASCOPIC INCISIONAL HERNIA  REPAIR  WITH MESH, OMENTOPEXY AND LYSIS OF ADHESIONS;  Surgeon: [name], MD;  Location: [place] [facility];  Service: General;  Laterality: N/A;  . CORONARY ANGIOPLASTY WITH STENT PLACEMENT  ~ 2002   "3"  . CORONARY ANGIOPLASTY WITH STENT PLACEMENT  01/24/2004   successful PCI/stenting RCA  drug eluting cypher stent  . CORONARY ANGIOPLASTY WITH STENT PLACEMENT  01/28/2004   successful stentin of a large bifurcation marginal branch of the ramus intermediate vessel  . FLEXIBLE SIGMOIDOSCOPY N/A 09/05/2014   Procedure: FLEXIBLE SIGMOIDOSCOPY;  Surgeon: Cleotis Nipper, MD;  Location: [facility] ENDOSCOPY;  Service: Endoscopy;  Laterality: N/A;  . HERNIA REPAIR    . INCISION AND DRAINAGE ABSCESS N/A 09/06/2014   Procedure: REMOVAL OF PD CATH;  Surgeon: Coralie Keens, MD;  Location: [facility];  Service: General;   Laterality: N/A;  . INCISIONAL HERNIA REPAIR  03/15/2014  . LAPAROSCOPIC LYSIS OF ADHESIONS  03/15/2014  . NM MYOCAR PERF WALL MOTION  02/06/2012   normal perfusion scan  . PERITONEAL CATHETER INSERTION  03/15/2014  . REFRACTIVE SURGERY Bilateral   . SHUNTOGRAM N/A 03/11/2013   Procedure: Fistulogram;  Surgeon: Conrad , MD;  Location: [facility] CATH LAB;  Service: Cardiovascular;  Laterality: N/A;  . TENDON TRANSFER Right 06/04/2019   Procedure: RIGHT HAND EXTENSOR TENDON TRANSFER TO SMALL FINGER;  Surgeon: Leanora Cover, MD;  Location: [facility];  Service: Orthopedics;  Laterality: Right;  . UMBILICAL HERNIA REPAIR         Family History  Problem Relation Age of Onset  . Asthma Mother   . Hyperlipidemia Mother   . Hypertension Mother   . Stroke Father   . Heart attack Father   . Prostate cancer Father   . Deep vein thrombosis Father   . Cancer Father   . Diabetes Father   . Hyperlipidemia Father   . Hypertension Father   . Other Father        varicose veins  . Heart disease Father        before age 20  . Other Sister        varicose veins    Social History   Tobacco Use  . Smoking status: Former Smoker    Packs/day: 1.00    Years: 15.00    Pack years: 15.00    Types: Cigarettes    Quit date: 09/24/1986    Years since quitting: 34.2  . Smokeless tobacco: Never Used  Vaping Use  . Vaping Use: Never used  Substance Use Topics  . Alcohol use: Not Currently    Comment: stopped in 2013  . Drug use: No    Home Medications Prior to Admission medications   Medication Sig Start Date End Date Taking? Authorizing Provider  albuterol (PROVENTIL HFA;VENTOLIN HFA) 108 (90 BASE) MCG/ACT inhaler Inhale 2 puffs into the lungs every 4 (four) hours as needed for wheezing. For wheezing    [provider]  aluminum hydroxide (AMPHOJEL/ALTERNAGEL) 320 MG/5ML suspension Take 10 mLs by mouth every 4 (four) hours as needed for indigestion or heartburn. 05/27/20   Love,  Ivan Anchors, PA-C  ascorbic acid (VITAMIN C) 1000 MG tablet Take 1 tablet (1,000 mg total) by mouth 2 (two) times daily. 05/27/20   Love, Ivan Anchors, PA-C  aspirin 81 MG chewable tablet Chew 1 tablet (81 mg total) by mouth every other day. 05/07/20   Simmons-Robinson, Makiera, MD  atorvastatin (LIPITOR) 40 MG tablet  Take 40 mg by mouth at bedtime.    [provider]  B Complex-C-Folic Acid (DIALYVITE Q000111Q) 0.8 MG TABS Take 1 tablet by mouth daily. 12/01/19   [provider]  BAYER ASPIRIN EC LOW DOSE 81 MG EC tablet Take 81 mg by mouth daily. 05/27/20   [provider]  carvedilol (COREG) 6.25 MG tablet Take 1 tablet (6.25 mg total) by mouth 2 (two) times daily with a meal. 05/27/20   Love, Ivan Anchors, PA-C  cetaphil (CETAPHIL) lotion Apply 1 application topically daily. 05/27/20   Love, Ivan Anchors, PA-C  cetaphil (CETAPHIL) lotion Apply topically. 05/27/20   [provider]  cinacalcet (SENSIPAR) 30 MG tablet Take 3 tablets (90 mg total) by mouth daily with supper. 05/06/20   Simmons-Robinson, Riki Sheer, MD  diphenhydrAMINE (BENADRYL) 12.5 MG/5ML elixir Take 5-10 mLs (12.5-25 mg total) by mouth every 6 (six) hours as needed for itching. 05/27/20   Love, Ivan Anchors, PA-C  diphenhydrAMINE (BENADRYL) 12.5 MG/5ML elixir Take by mouth. 05/27/20   [provider]  ferrous gluconate (FERGON) 324 MG tablet Take 1 tablet (324 mg total) by mouth 2 (two) times daily with a meal. 05/27/20   Love, Ivan Anchors, PA-C  fluticasone (FLONASE) 50 MCG/ACT nasal spray Place 2 sprays into both nostrils daily.    [provider]  gabapentin (NEURONTIN) 100 MG capsule Take 1 capsule (100 mg total) by mouth every dialysis. 07/04/20   Jamse Arn, MD  insulin detemir (LEVEMIR) 100 UNIT/ML injection Inject 0.2 mLs (20 Units total) into the skin at bedtime. 05/06/20   Zenia Resides, MD  melatonin 5 MG TABS Take 1 tablet (5 mg total) by mouth at bedtime. 05/27/20   Love, Ivan Anchors, PA-C  melatonin 5 MG  TABS Take by mouth. 05/27/20   [provider]  Methoxy PEG-Epoetin Beta (MIRCERA IJ) Mircera 06/28/20 06/27/21  [provider]  Nutritional Supplements (,FEEDING SUPPLEMENT, PROSOURCE PLUS) liquid Take 30 mLs by mouth 2 (two) times daily between meals. 05/27/20   Love, Ivan Anchors, PA-C  Nutritional Supplements (,FEEDING SUPPLEMENT, PROSOURCE PLUS) liquid Take by mouth. 05/27/20   [provider]  oxyCODONE (OXY IR/ROXICODONE) 5 MG immediate release tablet Take 1 tablet (5 mg total) by mouth daily as needed for severe pain. 05/27/20   Love, Ivan Anchors, PA-C  oxyCODONE (OXY IR/ROXICODONE) 5 MG immediate release tablet Take by mouth. 05/27/20   [provider]  pantoprazole (PROTONIX) 40 MG tablet Take 1 tablet (40 mg total) by mouth daily. 05/07/20   Simmons-Robinson, Makiera, MD  pantoprazole (PROTONIX) 40 MG tablet Take by mouth. 05/27/20   [provider]  polyethylene glycol (MIRALAX / GLYCOLAX) packet Take 17 g by mouth daily. 09/08/14   Janora Norlander, DO  sevelamer carbonate (RENVELA) 2.4 g PACK Take 4.8 g by mouth 3 (three) times daily with meals. 05/27/20   Love, Ivan Anchors, PA-C  sevelamer carbonate (RENVELA) 2.4 g PACK Take by mouth. 11/25/18   [provider]  umeclidinium bromide (INCRUSE ELLIPTA) 62.5 MCG/INH AEPB Inhale 1 puff into the lungs daily. 05/28/20   Love, Ivan Anchors, PA-C  umeclidinium bromide (INCRUSE ELLIPTA) 62.5 MCG/INH AEPB Inhale into the lungs. 05/27/20   [provider]    Allergies    Ferumoxytol, Iron, Iodinated diagnostic agents, and Sildenafil  Review of Systems   Review of Systems 10 systems reviewed and negative except as per HPI Physical Exam Updated Vital Signs BP (!) 145/103   Pulse 92  Temp 97.7 F (36.5 C)   Resp 18   Ht 6' (1.829 m)   Wt 129 kg   SpO2 99%   BMI 38.57 kg/m   Physical Exam Constitutional:      Comments: Alert and nontoxic.  Sitting at the edge of the bed.  Significant central obesity.   HENT:     Head: Normocephalic and atraumatic.     Mouth/Throat:     Mouth: Mucous membranes are moist.     Pharynx: Oropharynx is clear.  Eyes:     Extraocular Movements: Extraocular movements intact.  Cardiovascular:     Rate and Rhythm: Normal rate and regular rhythm.  Pulmonary:     Comments: No respiratory distress.  Breath sounds are soft at the bases.  Clear from mid lung fields to the top. Abdominal:     Comments: Abdomen is obese.  Soft and nontender.  No guarding.  No abdominal wall edema.  Musculoskeletal:     Comments: Lower leg amputation on the left.  2+ edema of the right lower extremity.  Neurological:     General: No focal deficit present.     Mental Status: He is oriented to person, place, and time.     Coordination: Coordination normal.     ED Results / Procedures / Treatments   Labs (all labs ordered are listed, but only abnormal results are displayed) Labs Reviewed  COMPREHENSIVE METABOLIC PANEL - Abnormal; Notable for the following components:      Result Value   Sodium 133 (*)    Chloride 94 (*)    Creatinine, Ser 5.93 (*)    Albumin 3.3 (*)    GFR, Estimated 10 (*)    All other components within normal limits  CBC - Abnormal; Notable for the following components:   Hemoglobin 12.1 (*)    HCT 37.5 (*)    MCV 79.8 (*)    MCH 25.7 (*)    RDW 21.7 (*)    Platelets 135 (*)    All other components within normal limits  TROPONIN I (HIGH SENSITIVITY) - Abnormal; Notable for the following components:   Troponin I (High Sensitivity) 43 (*)    All other components within normal limits  TROPONIN I (HIGH SENSITIVITY) - Abnormal; Notable for the following components:   Troponin I (High Sensitivity) 44 (*)    All other components within normal limits  LIPASE, BLOOD  URINALYSIS, ROUTINE W REFLEX MICROSCOPIC    EKG EKG Interpretation  Date/Time:  Saturday December 03 2020 10:29:14 EST Ventricular Rate:  93 PR Interval:  166 QRS Duration: 154 QT  Interval:  450 QTC Calculation: 559 R Axis:   169 Text Interpretation: Sinus rhythm with frequent Premature ventricular complexes Right bundle branch block Abnormal ECG old RBBB. no sig change Confirmed by Charlesetta Shanks [phone]) on 12/03/2020 12:24:12 PM   Radiology DG Chest 2 View  Result Date: 12/03/2020 CLINICAL DATA:  Chest pain and shortness of breath EXAM: CHEST - 2 VIEW COMPARISON:  October 13, 2020 FINDINGS: Central catheter tip is in the right atrium. No pneumothorax. There is a small right pleural effusion with bibasilar atelectasis. There is cardiomegaly with pulmonary venous hypertension. Patient is status post coronary artery bypass grafting. No adenopathy. No bone lesions. IMPRESSION: Central catheter tip in right atrium. No pneumothorax. Cardiomegaly with pulmonary vascular congestion and small right pleural effusion. Question degree of volume overload/congestive heart failure. Bibasilar atelectasis noted. Electronically Signed   By: Lowella Grip III M.D.   On: 12/03/2020  10:59    Procedures Procedures   Medications Ordered in ED Medications  Barium Sulfate 2.1 % SUSP (  Given by Other 12/03/20 1428)    ED Course  I have reviewed the triage vital signs and the nursing notes.  Pertinent labs & imaging results that were available during my care of the patient were reviewed by me and considered in my medical decision making (see chart for details).    MDM Rules/Calculators/A&P                          Patient describes symptoms of reflux and early satiety.  Clinically he is well in appearance.  Abdomen is soft and nontender.  Labs are stable at baseline.  Patient completed his dialysis session today.  Patient was able to eat a fairly large meal in the emergency department without vomiting or significant reflux symptoms however, he was unable to lie flat for his CT scan.  He reports every time he lays flat he can feel it coming back up in his esophagus.  At this time,  there was however no vomiting or choking.  Recommendations are for patient to carefully follow reflux instructions with taking daily Protonix and dietary measures.  I have discussed with the patient he knows he needs to follow-up ASAP with his provider to reschedule a CT scan and likely an upper endoscopy.  Patient voices understanding. Final Clinical Impression(s) / ED Diagnoses Final diagnoses:  Epigastric pain  Gastroesophageal reflux disease, unspecified whether esophagitis present  Constipation, unspecified constipation type  ESRD (end stage renal disease) on dialysis [facility])    Rx / DC Orders ED Discharge Orders    None       Charlesetta Shanks, MD 12/03/20 1610

## 2020-12-03 NOTE — ED Triage Notes (Signed)
Pt reports having a chest cold the last 3 weeks.  States he is having pressure in his abd and after eating he has pressure that moves from abd to chest to throat.  Reports SOB.  Also reports constipation.

## 2020-12-03 NOTE — ED Notes (Signed)
Pt returned from CT. Pt refused CT scan, MD aware.

## 2020-12-03 NOTE — Progress Notes (Signed)
Patient refused CT scan.

## 2021-02-22 ENCOUNTER — Ambulatory Visit: Payer: Medicare Other | Admitting: Podiatry

## 2021-06-23 ENCOUNTER — Other Ambulatory Visit: Payer: Self-pay

## 2021-06-23 ENCOUNTER — Ambulatory Visit (HOSPITAL_COMMUNITY)
Admission: RE | Admit: 2021-06-23 | Discharge: 2021-06-23 | Disposition: A | Discharge status: Home / Self Care | Payer: Medicare Other | Source: Ambulatory Visit | Attending: Physician Assistant | Admitting: Physician Assistant

## 2021-06-23 DIAGNOSIS — I739 Peripheral vascular disease, unspecified: Secondary | ICD-10-CM | POA: Diagnosis not present

## 2021-06-30 ENCOUNTER — Ambulatory Visit: Payer: Medicare Other

## 2021-07-05 ENCOUNTER — Ambulatory Visit: Payer: Medicare Other

## 2021-07-10 ENCOUNTER — Ambulatory Visit: Payer: Medicare Other

## 2021-07-12 ENCOUNTER — Ambulatory Visit: Payer: Medicare Other

## 2021-09-15 ENCOUNTER — Emergency Department (HOSPITAL_COMMUNITY): Payer: No Typology Code available for payment source

## 2021-09-15 ENCOUNTER — Encounter (HOSPITAL_COMMUNITY): Payer: Self-pay

## 2021-09-15 ENCOUNTER — Inpatient Hospital Stay (HOSPITAL_COMMUNITY)
Admission: EM | Admit: 2021-09-15 | Discharge: 2021-09-22 | DRG: 286 | Disposition: A | Discharge status: Skilled Nursing Facility | Payer: No Typology Code available for payment source | Source: Ambulatory Visit | Attending: Internal Medicine | Admitting: Internal Medicine

## 2021-09-15 ENCOUNTER — Other Ambulatory Visit: Payer: Self-pay

## 2021-09-15 DIAGNOSIS — Z8249 Family history of ischemic heart disease and other diseases of the circulatory system: Secondary | ICD-10-CM

## 2021-09-15 DIAGNOSIS — Z6834 Body mass index (BMI) 34.0-34.9, adult: Secondary | ICD-10-CM

## 2021-09-15 DIAGNOSIS — Z20822 Contact with and (suspected) exposure to covid-19: Secondary | ICD-10-CM | POA: Diagnosis present

## 2021-09-15 DIAGNOSIS — E11311 Type 2 diabetes mellitus with unspecified diabetic retinopathy with macular edema: Secondary | ICD-10-CM | POA: Diagnosis present

## 2021-09-15 DIAGNOSIS — Z794 Long term (current) use of insulin: Secondary | ICD-10-CM

## 2021-09-15 DIAGNOSIS — Z79890 Hormone replacement therapy (postmenopausal): Secondary | ICD-10-CM

## 2021-09-15 DIAGNOSIS — Z79899 Other long term (current) drug therapy: Secondary | ICD-10-CM

## 2021-09-15 DIAGNOSIS — M898X9 Other specified disorders of bone, unspecified site: Secondary | ICD-10-CM | POA: Diagnosis present

## 2021-09-15 DIAGNOSIS — E872 Acidosis, unspecified: Secondary | ICD-10-CM | POA: Diagnosis present

## 2021-09-15 DIAGNOSIS — I5023 Acute on chronic systolic (congestive) heart failure: Secondary | ICD-10-CM | POA: Diagnosis present

## 2021-09-15 DIAGNOSIS — D696 Thrombocytopenia, unspecified: Secondary | ICD-10-CM | POA: Diagnosis present

## 2021-09-15 DIAGNOSIS — I252 Old myocardial infarction: Secondary | ICD-10-CM

## 2021-09-15 DIAGNOSIS — I132 Hypertensive heart and chronic kidney disease with heart failure and with stage 5 chronic kidney disease, or end stage renal disease: Secondary | ICD-10-CM | POA: Diagnosis not present

## 2021-09-15 DIAGNOSIS — N186 End stage renal disease: Secondary | ICD-10-CM | POA: Diagnosis present

## 2021-09-15 DIAGNOSIS — E785 Hyperlipidemia, unspecified: Secondary | ICD-10-CM | POA: Diagnosis present

## 2021-09-15 DIAGNOSIS — E1122 Type 2 diabetes mellitus with diabetic chronic kidney disease: Secondary | ICD-10-CM | POA: Diagnosis present

## 2021-09-15 DIAGNOSIS — Z89512 Acquired absence of left leg below knee: Secondary | ICD-10-CM

## 2021-09-15 DIAGNOSIS — R0600 Dyspnea, unspecified: Secondary | ICD-10-CM

## 2021-09-15 DIAGNOSIS — E8779 Other fluid overload: Secondary | ICD-10-CM | POA: Diagnosis not present

## 2021-09-15 DIAGNOSIS — I700 Atherosclerosis of aorta: Secondary | ICD-10-CM | POA: Diagnosis present

## 2021-09-15 DIAGNOSIS — Z9889 Other specified postprocedural states: Secondary | ICD-10-CM

## 2021-09-15 DIAGNOSIS — E118 Type 2 diabetes mellitus with unspecified complications: Secondary | ICD-10-CM | POA: Diagnosis present

## 2021-09-15 DIAGNOSIS — Z993 Dependence on wheelchair: Secondary | ICD-10-CM

## 2021-09-15 DIAGNOSIS — I2582 Chronic total occlusion of coronary artery: Secondary | ICD-10-CM | POA: Diagnosis present

## 2021-09-15 DIAGNOSIS — I428 Other cardiomyopathies: Secondary | ICD-10-CM | POA: Diagnosis present

## 2021-09-15 DIAGNOSIS — Z7982 Long term (current) use of aspirin: Secondary | ICD-10-CM

## 2021-09-15 DIAGNOSIS — Z8042 Family history of malignant neoplasm of prostate: Secondary | ICD-10-CM

## 2021-09-15 DIAGNOSIS — Z87891 Personal history of nicotine dependence: Secondary | ICD-10-CM

## 2021-09-15 DIAGNOSIS — I442 Atrioventricular block, complete: Secondary | ICD-10-CM | POA: Diagnosis present

## 2021-09-15 DIAGNOSIS — T81509A Unspecified complication of foreign body accidentally left in body following unspecified procedure, initial encounter: Secondary | ICD-10-CM

## 2021-09-15 DIAGNOSIS — Z8673 Personal history of transient ischemic attack (TIA), and cerebral infarction without residual deficits: Secondary | ICD-10-CM

## 2021-09-15 DIAGNOSIS — J9601 Acute respiratory failure with hypoxia: Secondary | ICD-10-CM | POA: Diagnosis present

## 2021-09-15 DIAGNOSIS — I2510 Atherosclerotic heart disease of native coronary artery without angina pectoris: Secondary | ICD-10-CM | POA: Diagnosis present

## 2021-09-15 DIAGNOSIS — Z833 Family history of diabetes mellitus: Secondary | ICD-10-CM

## 2021-09-15 DIAGNOSIS — Z950 Presence of cardiac pacemaker: Secondary | ICD-10-CM

## 2021-09-15 DIAGNOSIS — Z91041 Radiographic dye allergy status: Secondary | ICD-10-CM

## 2021-09-15 DIAGNOSIS — Z955 Presence of coronary angioplasty implant and graft: Secondary | ICD-10-CM

## 2021-09-15 DIAGNOSIS — N2581 Secondary hyperparathyroidism of renal origin: Secondary | ICD-10-CM | POA: Diagnosis present

## 2021-09-15 DIAGNOSIS — I5082 Biventricular heart failure: Secondary | ICD-10-CM | POA: Diagnosis present

## 2021-09-15 DIAGNOSIS — Z83438 Family history of other disorder of lipoprotein metabolism and other lipidemia: Secondary | ICD-10-CM

## 2021-09-15 DIAGNOSIS — E669 Obesity, unspecified: Secondary | ICD-10-CM | POA: Diagnosis present

## 2021-09-15 DIAGNOSIS — Z823 Family history of stroke: Secondary | ICD-10-CM

## 2021-09-15 DIAGNOSIS — E1151 Type 2 diabetes mellitus with diabetic peripheral angiopathy without gangrene: Secondary | ICD-10-CM | POA: Diagnosis present

## 2021-09-15 DIAGNOSIS — D631 Anemia in chronic kidney disease: Secondary | ICD-10-CM | POA: Diagnosis present

## 2021-09-15 DIAGNOSIS — Z992 Dependence on renal dialysis: Secondary | ICD-10-CM

## 2021-09-15 DIAGNOSIS — J90 Pleural effusion, not elsewhere classified: Secondary | ICD-10-CM

## 2021-09-15 DIAGNOSIS — E8770 Fluid overload, unspecified: Secondary | ICD-10-CM | POA: Diagnosis present

## 2021-09-15 DIAGNOSIS — I959 Hypotension, unspecified: Secondary | ICD-10-CM | POA: Diagnosis present

## 2021-09-15 DIAGNOSIS — Z825 Family history of asthma and other chronic lower respiratory diseases: Secondary | ICD-10-CM

## 2021-09-15 DIAGNOSIS — Z951 Presence of aortocoronary bypass graft: Secondary | ICD-10-CM

## 2021-09-15 DIAGNOSIS — I5043 Acute on chronic combined systolic (congestive) and diastolic (congestive) heart failure: Secondary | ICD-10-CM | POA: Diagnosis present

## 2021-09-15 DIAGNOSIS — E11649 Type 2 diabetes mellitus with hypoglycemia without coma: Secondary | ICD-10-CM | POA: Diagnosis present

## 2021-09-15 DIAGNOSIS — Z888 Allergy status to other drugs, medicaments and biological substances status: Secondary | ICD-10-CM

## 2021-09-15 LAB — CBC WITH DIFFERENTIAL/PLATELET
Abs Immature Granulocytes: 0.01 10*3/uL (ref 0.00–0.07)
Basophils Absolute: 0 10*3/uL (ref 0.0–0.1)
Basophils Relative: 1 %
Eosinophils Absolute: 0.2 10*3/uL (ref 0.0–0.5)
Eosinophils Relative: 4 %
HCT: 30.6 % — ABNORMAL LOW (ref 39.0–52.0)
Hemoglobin: 9.3 g/dL — ABNORMAL LOW (ref 13.0–17.0)
Immature Granulocytes: 0 %
Lymphocytes Relative: 27 %
Lymphs Abs: 1.3 10*3/uL (ref 0.7–4.0)
MCH: 25.4 pg — ABNORMAL LOW (ref 26.0–34.0)
MCHC: 30.4 g/dL (ref 30.0–36.0)
MCV: 83.6 fL (ref 80.0–100.0)
Monocytes Absolute: 0.7 10*3/uL (ref 0.1–1.0)
Monocytes Relative: 14 %
Neutro Abs: 2.5 10*3/uL (ref 1.7–7.7)
Neutrophils Relative %: 54 %
Platelets: 71 10*3/uL — ABNORMAL LOW (ref 150–400)
RBC: 3.66 MIL/uL — ABNORMAL LOW (ref 4.22–5.81)
RDW: 21.8 % — ABNORMAL HIGH (ref 11.5–15.5)
WBC: 4.7 10*3/uL (ref 4.0–10.5)
nRBC: 0 % (ref 0.0–0.2)

## 2021-09-15 LAB — COMPREHENSIVE METABOLIC PANEL
ALT: 30 U/L (ref 0–44)
AST: 49 U/L — ABNORMAL HIGH (ref 15–41)
Albumin: 2.7 g/dL — ABNORMAL LOW (ref 3.5–5.0)
Alkaline Phosphatase: 147 U/L — ABNORMAL HIGH (ref 38–126)
Anion gap: 13 (ref 5–15)
BUN: 10 mg/dL (ref 8–23)
CO2: 27 mmol/L (ref 22–32)
Calcium: 7.3 mg/dL — ABNORMAL LOW (ref 8.9–10.3)
Chloride: 96 mmol/L — ABNORMAL LOW (ref 98–111)
Creatinine, Ser: 4.44 mg/dL — ABNORMAL HIGH (ref 0.61–1.24)
GFR, Estimated: 14 mL/min — ABNORMAL LOW (ref >60)
Glucose, Bld: 65 mg/dL — ABNORMAL LOW (ref 70–99)
Potassium: 3.8 mmol/L (ref 3.5–5.1)
Sodium: 136 mmol/L (ref 135–145)
Total Bilirubin: 2.1 mg/dL — ABNORMAL HIGH (ref 0.3–1.2)
Total Protein: 7.5 g/dL (ref 6.5–8.1)

## 2021-09-15 LAB — LIPASE, BLOOD: Lipase: 31 U/L (ref 11–51)

## 2021-09-15 LAB — LACTIC ACID, PLASMA: Lactic Acid, Venous: 4 mmol/L (ref 0.5–1.9)

## 2021-09-15 LAB — TROPONIN I (HIGH SENSITIVITY): Troponin I (High Sensitivity): 23 ng/L — ABNORMAL HIGH (ref <18)

## 2021-09-15 MED ORDER — ONDANSETRON HCL 4 MG/2ML IJ SOLN
4.0000 mg | Freq: Four times a day (QID) | INTRAMUSCULAR | Status: DC | PRN
  Administered 2021-09-17 – 2021-09-19 (×2): 4 mg via INTRAVENOUS
  Filled 2021-09-15 (×2): qty 2

## 2021-09-15 MED ORDER — ALBUTEROL SULFATE (2.5 MG/3ML) 0.083% IN NEBU
0.6300 mg | INHALATION_SOLUTION | Freq: Four times a day (QID) | RESPIRATORY_TRACT | Status: DC | PRN
  Administered 2021-09-20: 03:00:00 2.5 mg via RESPIRATORY_TRACT
  Filled 2021-09-15: qty 3

## 2021-09-15 MED ORDER — ATORVASTATIN CALCIUM 80 MG PO TABS
80.0000 mg | ORAL_TABLET | Freq: Every day | ORAL | Status: DC
  Administered 2021-09-16 – 2021-09-22 (×7): 80 mg via ORAL
  Filled 2021-09-15 (×7): qty 1

## 2021-09-15 MED ORDER — MIDODRINE HCL 5 MG PO TABS: 5.0000 mg | ORAL_TABLET | Freq: Three times a day (TID) | ORAL | Status: DC

## 2021-09-15 MED ORDER — GLUCOSE 40 % PO GEL
1.0000 | ORAL | Status: DC | PRN
  Administered 2021-09-16: 13:00:00 31 g via ORAL
  Filled 2021-09-15 (×2): qty 1

## 2021-09-15 MED ORDER — ACETAMINOPHEN 325 MG PO TABS
650.0000 mg | ORAL_TABLET | Freq: Four times a day (QID) | ORAL | Status: DC | PRN
  Administered 2021-09-16 – 2021-09-22 (×5): 650 mg via ORAL
  Filled 2021-09-15 (×5): qty 2

## 2021-09-15 MED ORDER — ONDANSETRON HCL 4 MG PO TABS
4.0000 mg | ORAL_TABLET | Freq: Four times a day (QID) | ORAL | Status: DC | PRN
  Administered 2021-09-22: 17:00:00 4 mg via ORAL
  Filled 2021-09-15: qty 1

## 2021-09-15 MED ORDER — SENNOSIDES-DOCUSATE SODIUM 8.6-50 MG PO TABS: 1.0000 | ORAL_TABLET | Freq: Every evening | ORAL | Status: DC | PRN

## 2021-09-15 MED ORDER — HYDROXYZINE HCL 25 MG PO TABS
25.0000 mg | ORAL_TABLET | Freq: Four times a day (QID) | ORAL | Status: DC | PRN
  Administered 2021-09-16 – 2021-09-22 (×11): 25 mg via ORAL
  Filled 2021-09-15 (×12): qty 1

## 2021-09-15 MED ORDER — SODIUM CHLORIDE 0.9% FLUSH
3.0000 mL | Freq: Two times a day (BID) | INTRAVENOUS | Status: DC
  Administered 2021-09-15 – 2021-09-20 (×5): 3 mL via INTRAVENOUS

## 2021-09-15 MED ORDER — ACETAMINOPHEN 650 MG RE SUPP: 650.0000 mg | Freq: Four times a day (QID) | RECTAL | Status: DC | PRN

## 2021-09-15 MED ORDER — ASPIRIN 325 MG PO TABS
325.0000 mg | ORAL_TABLET | Freq: Every day | ORAL | Status: DC
  Administered 2021-09-16: 09:00:00 325 mg via ORAL
  Filled 2021-09-15: qty 1

## 2021-09-15 MED ORDER — LACTATED RINGERS IV BOLUS
1000.0000 mL | Freq: Once | INTRAVENOUS | Status: AC
  Administered 2021-09-15: 18:00:00 1000 mL via INTRAVENOUS

## 2021-09-15 MED ORDER — PIPERACILLIN-TAZOBACTAM 3.375 G IVPB 30 MIN
3.3750 g | Freq: Once | INTRAVENOUS | Status: AC
  Administered 2021-09-15: 21:00:00 3.375 g via INTRAVENOUS
  Filled 2021-09-15: qty 50

## 2021-09-15 NOTE — H&P (Signed)
History and Physical    Jonathon Bailey JKD:[id number] DOB: 1956/09/03 DOA: 09/15/2021  PCP: [facility]  Patient coming from: Dialysis center  I have personally briefly reviewed patient's old medical records in [place]  Chief Complaint: Shortness of breath  HPI: Jonathon Bailey is a 65 y.o. 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  Patient recently admitted at [facility] 09/04/2021-09/14/2021 for dyspnea due to hypervolemia requiring serial dialysis.  He had been hypotensive and carvedilol was discontinued and he was started on low-dose midodrine.  He was noted to have rectal bleeding and underwent colonoscopy which showed large internal hemorrhoids.  Patient went to his routine dialysis session earlier today (12/23).  He says he did complete his full HD session but afterwards developed shortness of breath similar to his last admission.  He was subsequently sent to the ED.  He has had occasional cough which has been nonproductive.  He denies any chest pain, nausea, vomiting, abdominal pain.  He says he does not really make urine anymore.  He has swelling in his right leg which she says is actually decreased from his usual baseline.  ED Course:  Initial vitals showed BP 82/59, pulse 83, RR 17, temp 97.8 F, SPO2 98% on 2 L O2 via [place].  Labs show WBC 4.7, hemoglobin 9.3, platelets 71,000, sodium 136, potassium 3.8, bicarb 27, BUN 10, creatinine 4.44, serum glucose 65, AST 49, LT 30, alk phos 147, total bilirubin 2.1, troponin 23, lipase 31, lactic acid 4.0.  Blood cultures collected and pending.  Respiratory panel ordered and pending collection.  Portable chest x-ray  shows cardiomegaly with pulmonary vascular congestion and bilateral pleural effusions.  Left axis dialysis catheter seen by distal tip in the right atrium.  CT chest/abdomen/pelvis without contrast shows large bilateral pleural effusions, right greater than left and increased from prior exam from 2 days previous.  Diverticulosis without diverticulitis noted.  Free fluid within the abdomen seen and stable from prior exam.  Anasarca changes in the abdominal wall noted.  Patient was given 1 L LR, IV Zosyn.  EDP discussed with on-call nephrology who will consult in AM.  The hospitalist service was consulted to admit for further evaluation and management.  Review of Systems: All systems reviewed and are negative except as documented in history of present illness above.   Past Medical History:  Diagnosis Date   Anxiety    Arthritis    Asthma    "as a child"   CAD (coronary artery disease)    Chronic systolic heart failure ([place]) 09/21/2008   ECHO Feb 2013 showed LVEF low normal at 50-55%, +hypokinetic anterolateral wall and inferolateral wall.     CKD (chronic kidney disease) stage 4, GFR 15-29 ml/min (HCC) 08/30/2009   Progressive renal failure since 2008, creatinine 1.2 in 2008 up to 3.5 in 2012 and 3.2-5.0 in 2013. All UA's 2011-13 showed >300 protein on dipstick. Work-up in May 2011 showed negative Urine IFE and SPEP, ultrasound showed 12-13 cm kidneys with increased echogenicity and UPC ratio was 1.5 gm proteinuria.  Hgb A1C's from 2011 to 2013 were all between 9-11.  Patient saw Dr. Donnetta Hutching (vasc surgery) for HD access in Aug 2013 >  vein mapping was done and Dr. Donnetta Hutching felt the left arm (pt is R handed) was suitable for L arm Cimino radiocephalic fistula. Patient said he wasn't ready to consider doing dialysis and declined the surgery.      Depression    Diabetic retinopathy ([place])    ESRD (end stage renal disease) on dialysis ([place])    Headache(784.0)    "maybe monthly" (03/15/2014)    Hyperlipidemia    Hypertension    Macular edema    Myocardial infarction [facility])    status post MI x2 and 3 stents placed in 2003   Obesity    Sleep apnea    does not use CPAP   Stroke ([place]) ~ 2007; ~1987   "weak on right side; messed w/right side of brain; cry all the time"   Type II diabetes mellitus ([place])    insulin dependent    Past Surgical History:  Procedure Laterality Date   ABDOMINAL AORTOGRAM W/LOWER EXTREMITY N/A 04/29/2020   Procedure: ABDOMINAL AORTOGRAM W/LOWER EXTREMITY;  Surgeon: Angelia Mould, MD;  Location: [place] [facility];  Service: Cardiovascular;  Laterality: N/A;   AMPUTATION Left 05/02/2020   Procedure: AMPUTATION BELOW KNEE;  Surgeon: Rosetta Posner, MD;  Location: [place];  Service: Vascular;  Laterality: Left;   AV FISTULA PLACEMENT  10/17/2012   Procedure: ARTERIOVENOUS (AV) FISTULA CREATION;  Surgeon: Mal Misty, MD;  Location: [place];  Service: Vascular;  Laterality: Left;   BONE EXOSTOSIS EXCISION Right 06/04/2019   Procedure: EXOSTOSIS EXCISION;  Surgeon: Leanora Cover, MD;  Location: [place];  Service: Orthopedics;  Laterality: Right;   CAPD INSERTION N/A 03/15/2014   Procedure: LAPAROSCOPIC INSERTION CONTINUOUS AMBULATORY PERITONEAL DIALYSIS CATHETER, LAPARASCOPIC INCISIONAL HERNIA  REPAIR  WITH MESH, OMENTOPEXY AND LYSIS OF ADHESIONS;  Surgeon: [name], MD;  Location: [place];  Service: General;  Laterality: N/A;   CORONARY ANGIOPLASTY WITH STENT PLACEMENT  ~ 2002   "3"   CORONARY ANGIOPLASTY WITH STENT PLACEMENT  01/24/2004   successful PCI/stenting RCA  drug eluting cypher stent   CORONARY ANGIOPLASTY WITH STENT PLACEMENT  01/28/2004   successful stentin of a large bifurcation marginal branch of the ramus intermediate vessel   FLEXIBLE SIGMOIDOSCOPY N/A 09/05/2014   Procedure: FLEXIBLE SIGMOIDOSCOPY;  Surgeon: Cleotis Nipper, MD;  Location: [place];  Service: Endoscopy;  Laterality: N/A;   HERNIA REPAIR      INCISION AND DRAINAGE ABSCESS N/A 09/06/2014   Procedure: REMOVAL OF PD CATH;  Surgeon: Coralie Keens, MD;  Location: [place];  Service: General;  Laterality: N/A;   INCISIONAL HERNIA REPAIR  03/15/2014   LAPAROSCOPIC LYSIS OF ADHESIONS  03/15/2014   NM MYOCAR PERF WALL MOTION  02/06/2012   normal perfusion scan   PERITONEAL CATHETER INSERTION  03/15/2014   REFRACTIVE SURGERY Bilateral    SHUNTOGRAM N/A 03/11/2013   Procedure: Fistulogram;  Surgeon: Conrad [place], MD;  Location: [facility] CATH [facility];  Service: Cardiovascular;  Laterality: N/A;   TENDON TRANSFER Right 06/04/2019   Procedure: RIGHT HAND EXTENSOR TENDON TRANSFER TO SMALL FINGER;  Surgeon: Leanora Cover, MD;  Location: [place];  Service: Orthopedics;  Laterality: Right;   UMBILICAL HERNIA REPAIR      Social History:  reports that he quit smoking about 35 years ago. His smoking use included cigarettes. He has a 15.00 pack-year smoking history. He has never used smokeless tobacco. He reports that he does not currently use alcohol. He reports that he does  not use drugs.  Allergies  Allergen Reactions   Ferumoxytol Itching        Iron Itching         Iodinated Contrast Media Itching   Sildenafil Other (See Comments)    Other reaction(s): Abnormal sexual function    Family History  Problem Relation Age of Onset   Asthma Mother    Hyperlipidemia Mother    Hypertension Mother    Stroke Father    Heart attack Father    Prostate cancer Father    Deep vein thrombosis Father    Cancer Father    Diabetes Father    Hyperlipidemia Father    Hypertension Father    Other Father        varicose veins   Heart disease Father        before age 21   Other Sister        varicose veins     Prior to Admission medications   Medication Sig Start Date End Date Taking? Authorizing Provider  albuterol (PROVENTIL HFA;VENTOLIN HFA) 108 (90 BASE) MCG/ACT inhaler Inhale 2 puffs into the lungs every 4 (four) hours as needed for  wheezing. For wheezing    [provider]  aluminum hydroxide (AMPHOJEL/ALTERNAGEL) 320 MG/5ML suspension Take 10 mLs by mouth every 4 (four) hours as needed for indigestion or heartburn. 05/27/20   Love, Ivan Anchors, PA-C  ascorbic acid (VITAMIN C) 1000 MG tablet Take 1 tablet (1,000 mg total) by mouth 2 (two) times daily. 05/27/20   Love, Ivan Anchors, PA-C  aspirin 81 MG chewable tablet Chew 1 tablet (81 mg total) by mouth every other day. 05/07/20   Simmons-Robinson, Makiera, MD  atorvastatin (LIPITOR) 40 MG tablet Take 40 mg by mouth at bedtime.    [provider]  B Complex-C-Folic Acid (DIALYVITE 867) 0.8 MG TABS Take 1 tablet by mouth daily. 12/01/19   [provider]  BAYER ASPIRIN EC LOW DOSE 81 MG EC tablet Take 81 mg by mouth daily. 05/27/20   [provider]  carvedilol (COREG) 6.25 MG tablet Take 1 tablet (6.25 mg total) by mouth 2 (two) times daily with a meal. 05/27/20   Love, Ivan Anchors, PA-C  cetaphil (CETAPHIL) lotion Apply 1 application topically daily. 05/27/20   Love, Ivan Anchors, PA-C  cetaphil (CETAPHIL) lotion Apply topically. 05/27/20   [provider]  cinacalcet (SENSIPAR) 30 MG tablet Take 3 tablets (90 mg total) by mouth daily with supper. 05/06/20   Simmons-Robinson, Riki Sheer, MD  diphenhydrAMINE (BENADRYL) 12.5 MG/5ML elixir Take 5-10 mLs (12.5-25 mg total) by mouth every 6 (six) hours as needed for itching. 05/27/20   Love, Ivan Anchors, PA-C  diphenhydrAMINE (BENADRYL) 12.5 MG/5ML elixir Take by mouth. 05/27/20   [provider]  ferrous gluconate (FERGON) 324 MG tablet Take 1 tablet (324 mg total) by mouth 2 (two) times daily with a meal. 05/27/20   Love, Ivan Anchors, PA-C  fluticasone (FLONASE) 50 MCG/ACT nasal spray Place 2 sprays into both nostrils daily.    [provider]  gabapentin (NEURONTIN) 100 MG capsule Take 1 capsule (100 mg total) by mouth every dialysis. 07/04/20   Jamse Arn, MD  insulin detemir (LEVEMIR) 100 UNIT/ML  injection Inject 0.2 mLs (20 Units total) into the skin at bedtime. 05/06/20   Zenia Resides, MD  melatonin 5 MG TABS Take 1 tablet (5 mg total) by mouth at bedtime. 05/27/20   Love, Ivan Anchors, PA-C  melatonin 5 MG TABS Take  by mouth. 05/27/20   [provider]  Nutritional Supplements (,FEEDING SUPPLEMENT, PROSOURCE PLUS) liquid Take 30 mLs by mouth 2 (two) times daily between meals. 05/27/20   Love, Evlyn Kanner, PA-C  Nutritional Supplements (,FEEDING SUPPLEMENT, PROSOURCE PLUS) liquid Take by mouth. 05/27/20   [provider]  oxyCODONE (OXY IR/ROXICODONE) 5 MG immediate release tablet Take 1 tablet (5 mg total) by mouth daily as needed for severe pain. 05/27/20   Love, Evlyn Kanner, PA-C  oxyCODONE (OXY IR/ROXICODONE) 5 MG immediate release tablet Take by mouth. 05/27/20   [provider]  pantoprazole (PROTONIX) 40 MG tablet Take 1 tablet (40 mg total) by mouth daily. 05/07/20   Simmons-Robinson, Makiera, MD  pantoprazole (PROTONIX) 40 MG tablet Take by mouth. 05/27/20   [provider]  polyethylene glycol (MIRALAX / GLYCOLAX) packet Take 17 g by mouth daily. 09/08/14   Raliegh Ip, DO  sevelamer carbonate (RENVELA) 2.4 g PACK Take 4.8 g by mouth 3 (three) times daily with meals. 05/27/20   Love, Evlyn Kanner, PA-C  sevelamer carbonate (RENVELA) 2.4 g PACK Take by mouth. 11/25/18   [provider]  umeclidinium bromide (INCRUSE ELLIPTA) 62.5 MCG/INH AEPB Inhale 1 puff into the lungs daily. 05/28/20   Love, Evlyn Kanner, PA-C  umeclidinium bromide (INCRUSE ELLIPTA) 62.5 MCG/INH AEPB Inhale into the lungs. 05/27/20   [provider]    Physical Exam: Vitals:   09/15/21 1900 09/15/21 1915 09/15/21 2000 09/15/21 2115  BP: 90/65 (!) 89/73 94/72 100/83  Pulse:   82 85  Resp: (!) 23 13 14  (!) 21  Temp:      TempSrc:      SpO2:   94% 99%  Weight:      Height:       Constitutional: Obese man resting in bed with head elevated, NAD, calm, comfortable Eyes: PERRL,  lids and conjunctivae normal ENMT: Mucous membranes are moist. Posterior pharynx clear of any exudate or lesions.Normal dentition.  Neck: normal, supple, no masses. Respiratory: Diminished breath sounds bilateral lung bases, clear to auscultation upper lung fields. Normal respiratory effort. No accessory muscle use.  Cardiovascular: Regular rate and rhythm, no murmurs / rubs / gallops.  Tight +1 pitting edema RLE. Abdomen: no tenderness, no masses palpated. No hepatosplenomegaly. Bowel sounds positive.  Musculoskeletal: S/p left BKA.  No clubbing / cyanosis. No joint deformity upper and lower extremities. Good ROM, no contractures. Normal muscle tone.  Skin: no rashes, lesions, ulcers. No induration Neurologic: CN 2-12 grossly intact. Sensation intact. Strength 5/5 in all 4.  Psychiatric: Normal judgment and insight. Alert and oriented x 3. Normal mood.   Labs on Admission: I have personally reviewed following labs and imaging studies  CBC: Recent Labs  Lab 09/15/21 1826  WBC 4.7  NEUTROABS 2.5  HGB 9.3*  HCT 30.6*  MCV 83.6  PLT 71*   Basic Metabolic Panel: Recent Labs  Lab 09/15/21 1826  NA 136  K 3.8  CL 96*  CO2 27  GLUCOSE 65*  BUN 10  CREATININE 4.44*  CALCIUM 7.3*   GFR: Estimated Creatinine Clearance: 21.3 mL/min (A) (by C-G formula based on SCr of 4.44 mg/dL (H)). Liver Function Tests: Recent Labs  Lab 09/15/21 1826  AST 49*  ALT 30  ALKPHOS 147*  BILITOT 2.1*  PROT 7.5  ALBUMIN 2.7*   Recent Labs  Lab 09/15/21 1826  LIPASE 31   No results for input(s): AMMONIA in the last 168 hours. Coagulation Profile: No results for input(s):  INR, PROTIME in the last 168 hours. Cardiac Enzymes: No results for input(s): CKTOTAL, CKMB, CKMBINDEX, TROPONINI in the last 168 hours. BNP (last 3 results) No results for input(s): PROBNP in the last 8760 hours. HbA1C: No results for input(s): HGBA1C in the last 72 hours. CBG: No results for input(s): GLUCAP in the  last 168 hours. Lipid Profile: No results for input(s): CHOL, HDL, LDLCALC, TRIG, CHOLHDL, LDLDIRECT in the last 72 hours. Thyroid Function Tests: No results for input(s): TSH, T4TOTAL, FREET4, T3FREE, THYROIDAB in the last 72 hours. Anemia Panel: No results for input(s): VITAMINB12, FOLATE, FERRITIN, TIBC, IRON, RETICCTPCT in the last 72 hours. Urine analysis:    Component Value Date/Time   COLORURINE YELLOW 09/02/2014 0159   APPEARANCEUR CLEAR 09/02/2014 0159   LABSPEC 1.019 09/02/2014 0159   PHURINE 5.5 09/02/2014 0159   GLUCOSEU 500 (A) 09/02/2014 0159   HGBUR MODERATE (A) 09/02/2014 0159   BILIRUBINUR NEGATIVE 09/02/2014 0159   KETONESUR NEGATIVE 09/02/2014 0159   PROTEINUR >300 (A) 09/02/2014 0159   UROBILINOGEN 0.2 09/02/2014 0159   NITRITE NEGATIVE 09/02/2014 0159   LEUKOCYTESUR NEGATIVE 09/02/2014 0159    Radiological Exams on Admission: DG Chest Port 1 View  Result Date: 09/15/2021 CLINICAL DATA:  Shortness of breath EXAM: PORTABLE CHEST 1 VIEW COMPARISON:  Chest radiograph dated December 03, 2020 FINDINGS: The heart is markedly enlarged. Pulmonary vascular congestion. Moderate bilateral pleural effusions. Dialysis catheter with distal tip in the right atrium. IMPRESSION: Cardiomegaly with pulmonary vascular congestion and moderate bilateral pleural effusions, suggesting volume overload/pulmonary edema. Left access dialysis catheter with distal tip in the right atrium. Electronically Signed   By: Keane Police D.O.   On: 09/15/2021 18:00   CT CHEST ABDOMEN PELVIS WO CONTRAST  Result Date: 09/15/2021 CLINICAL DATA:  Possible sepsis, shortness of breath and abdominal pain, initial encounter EXAM: CT CHEST, ABDOMEN AND PELVIS WITHOUT CONTRAST TECHNIQUE: Multidetector CT imaging of the chest, abdomen and pelvis was performed following the standard protocol without IV contrast. COMPARISON:  09/13/2021 FINDINGS: CT CHEST FINDINGS Cardiovascular: Heart is not significantly enlarged in  size. Heavy coronary calcifications are noted. Mild atherosclerotic calcifications of the thoracic aorta are noted. Changes of prior coronary bypass grafting are seen. Left jugular dialysis catheter is noted in satisfactory position. Mediastinum/Nodes: Thoracic inlet is within normal limits. The esophagus is within normal limits. No sizable hilar or mediastinal adenopathy is noted. Lungs/Pleura: Large bilateral pleural effusions are noted right slightly greater than left. These of increased in the interval from the prior exam. Lower lobe consolidation is noted stable from the prior study. No sizable parenchymal nodule is seen. Musculoskeletal: Degenerative changes of the thoracic spine are noted. No acute rib abnormality is seen. No compression deformity is noted. Postsurgical changes in the sternum are seen. CT ABDOMEN PELVIS FINDINGS Hepatobiliary: No focal liver abnormality is seen. Gallbladder is decompressed but stable. Pancreas: Unremarkable. No pancreatic ductal dilatation or surrounding inflammatory changes. Spleen: Normal in size without focal abnormality. Adrenals/Urinary Tract: Adrenal glands are within normal limits. Kidneys are well visualized bilaterally. No renal calculi or obstructive changes are noted. The bladder is decompressed. Stomach/Bowel: Diverticular changes noted without evidence of diverticulitis. Previously seen postsurgical changes in the sigmoid are less well appreciated on today's exam due to contrast material throughout the sigmoid. More proximal colon is decompressed. No obstructive or inflammatory changes are seen. The appendix is within normal limits. Vascular/Lymphatic: Aortic atherosclerosis. No enlarged abdominal or pelvic lymph nodes. Reproductive: Prostate is unremarkable. Other: Mild free fluid is noted  stable in appearance from the prior exam. Considerable changes of anasarca are noted within the abdominal wall slightly increased when compared with the prior exam.  Musculoskeletal: Degenerative changes of lumbar spine are seen. IMPRESSION: CT of the chest: Bilateral pleural effusions right considerably greater than left and increased from the prior examination from 2 days previous. Associated lower lobe consolidation is noted. CT of the abdomen and pelvis: Diverticulosis without diverticulitis. Free fluid within the abdomen stable from the prior exam. Increasing changes of anasarca in the abdominal wall. Aortic Atherosclerosis (ICD10-I70.0). Electronically Signed   By: Inez Catalina M.D.   On: 09/15/2021 20:33    EKG: Personally reviewed. V paced rhythm.  Paced rhythm is new when compared to prior.  Assessment/Plan Principal Problem:   Volume overload Active Problems:   Diabetes mellitus type 2 with complications (HCC)   Coronary atherosclerosis   Anemia in chronic kidney disease, on chronic dialysis ([place])   ESRD on dialysis ([place])   Acute on chronic systolic CHF (congestive heart failure) ([place])   Hypotension   Lactic acidosis   Thrombocytopenia ([place])   Jonathon Bailey is a 65 y.o. 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  Volume overload with large bilateral pleural effusions ESRD on MWF HD Acute on chronic systolic CHF: Volume overload secondary to acute on chronic CHF in the setting of ESRD.  Persistent and enlarging bilateral pleural effusions despite hemodialysis. -Nephrology to see in a.m. -May need continued serial HD for volume removal as BP allows -Consider thoracentesis  Hypotension: Discontinue Coreg.  Continue midodrine.  Avoid further fluid boluses.  Lactic acidosis: Likely secondary to poor perfusion in setting of hypotension.  No  obvious active infection source.  Hold further antibiotics.  Thrombocytopenia: Stable without obvious bleeding.  Continue to monitor.  Anemia of chronic kidney disease: Hemoglobin stable, continue to monitor.  Hypoglycemia in setting of insulin-dependent type 2 diabetes: Serum glucose 65, he is asymptomatic.  Hold insulin, start on hypoglycemia protocol.  CAD s/p CABG: Stable, denies any chest pain.  Continue aspirin and atorvastatin.  High-grade AV block s/p leadless pacemaker on 06/09/2021: V paced rhythm on admission.  DVT prophylaxis: SCD Code Status: Full code, confirmed with patient Family Communication: Discussed with patient, he has discussed with family Disposition Plan: From SNF and likely return to SNF pending clinical progress Consults called: Nephrology to see in a.m. Level of care: Telemetry Cardiac Admission status:  Status is: Observation  The patient remains OBS appropriate and will d/c before 2 midnights.  Zada Finders MD [facility]  If 7PM-7AM, please contact night-coverage [email or web address]  09/15/2021, 11:44 PM

## 2021-09-15 NOTE — ED Triage Notes (Signed)
Pt BIB EMS from dialysis w/ complaints of shortness of breath and abdominal pain. Pt was seen for the same reason at [facility], where he was told he had excess fluid buildup. EMS had pt on 2L [place] on route.   VS were stable with the exception of a BP of 100/66.

## 2021-09-15 NOTE — ED Notes (Signed)
Tomma Rakers (Son) called and is asking for an update. call back is [phone]

## 2021-09-15 NOTE — ED Provider Notes (Signed)
[facility] EMERGENCY DEPARTMENT Provider Note   CSN: [id number] Arrival date & time: 09/15/21  1649     History Chief Complaint  Patient presents with   Shortness of Breath    Jonathon Bailey is a 65 y.o. male.  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  Patient states he finished dialysis and had increased shortness of breath and has to be taken to the hospital.  He also states that he has had persistent chest pain persistent shortness of breath persistent abdominal pain intermittently for the past month.  He was recently discharged just yesterday from prolonged admission at [facility] [place] [facility] [facility].  He states he was discharged to a rehab facility.  He was taken to dialysis today and he finishes dialysis, but complained of shortness of breath and abdominal pain and was sent to the ER here for further evaluation.  Otherwise denies any new cough denies fevers denies vomiting or diarrhea.  He states he had a normal full dialysis session today.      Past Medical History:  Diagnosis Date   Anxiety    Arthritis    Asthma    "as a child"   CAD (coronary artery disease)    Chronic systolic heart failure ([place]) 09/21/2008   ECHO Feb 2013 showed LVEF low normal at 50-55%, +hypokinetic anterolateral wall and inferolateral wall.     CKD (chronic kidney disease) stage 4, GFR 15-29 ml/min (HCC) 08/30/2009   Progressive renal failure since 2008, creatinine 1.2 in 2008 up to 3.5 in 2012 and 3.2-5.0 in 2013. All UA's 2011-13 showed >300 protein on dipstick. Work-up in May 2011 showed negative Urine IFE and SPEP, ultrasound showed 12-13 cm kidneys with increased echogenicity and UPC ratio was 1.5 gm proteinuria.  Hgb A1C's from 2011 to 2013 were all between 9-11.  Patient saw Dr. Donnetta Hutching (vasc surgery) for HD access in Aug 2013 > vein mapping was done and Dr. Donnetta Hutching felt the left arm (pt is R  handed) was suitable for L arm Cimino radiocephalic fistula. Patient said he wasn't ready to consider doing dialysis and declined the surgery.      Depression    Diabetic retinopathy ([place])    ESRD (end stage renal disease) on dialysis ([place])    Headache(784.0)    "maybe monthly" (03/15/2014)   Hyperlipidemia    Hypertension    Macular edema    Myocardial infarction [facility])    status post MI x2 and 3 stents placed in 2003   Obesity    Sleep apnea    does not use CPAP   Stroke ([place]) ~ 2007; ~1987   "weak on right side; messed w/right side of brain; cry all the time"   Type II diabetes mellitus (HCC)    insulin dependent    Patient Active Problem List   Diagnosis Date Noted   Volume overload 09/15/2021   Moderate protein-calorie malnutrition ([place]) 05/27/2020   Allergy, unspecified, initial encounter 05/18/2020   Anaphylactic shock, unspecified, initial encounter 05/18/2020   ESRD on dialysis [facility])    Constipation    Noncompliance    Abdominal distension    Drug induced constipation    Leukocytosis    Essential hypertension    Labile blood pressure    Controlled type 2 diabetes mellitus with hyperglycemia, with long-term current use of insulin (HCC)    Unilateral complete BKA, left, initial encounter ([place]) 05/06/2020   Postoperative pain  Acute on chronic anemia    Anemia of chronic disease    S/P BKA (below knee amputation) unilateral, left (HCC)    Sepsis without septic shock ([place]) 04/28/2020   Wet gangrene ([place]) 04/28/2020   Fluid overload, unspecified 08/03/2019   Hyperkalemia 10/21/2018   Encounter for removal of sutures 09/22/2018   Morbid obesity with BMI of 40.0-44.9, adult ([place]) 03/05/2018   History of completed stroke 02/14/2018   Unstable angina ([place]) 02/14/2018   Dependence on renal dialysis ([place]) 11/12/2017   Chills (without fever) 01/01/2017   Local infection due to central venous catheter, initial encounter 09/08/2016   Diabetic retinopathy ([place]) 08/21/2016    Hypercalcemia 10/25/2014   Anxiety disorder, unspecified 09/10/2014   Coagulation defect, unspecified ([place]) 09/10/2014   Iron deficiency anemia, unspecified 09/10/2014   Major depressive disorder, single episode, unspecified 09/10/2014   Pain, unspecified 09/10/2014   Peritonitis, unspecified ([place]) 09/10/2014   Pruritus, unspecified 09/10/2014   Secondary hyperparathyroidism of renal origin ([place]) 09/10/2014   Unspecified osteoarthritis, unspecified site 09/10/2014   Other specified fever    Generalized abdominal pain    Acute lower GI bleeding    ESRD (end stage renal disease) ([place])    Bacterial peritonitis ([place])    Blood poisoning    ESRD on peritoneal dialysis ([place])    Acute blood loss anemia    Abdominal pain 09/01/2014   CAPD (continuous ambulatory peritoneal dialysis) status 03/15/2014   CAD S/P percutaneous coronary angioplasty 02/03/2014   H/O CHF 02/03/2014   History of cocaine use 02/03/2014   S/P coronary artery stent placement 02/03/2014   Morbid obesity ([place]) 02/01/2014   Recurrent incisional hernia x5 with incarceration s/p lap repair w mesh 03/15/2014 02/01/2014   Preventative health care 94/76/5465   Other complications due to renal dialysis device, implant, and graft 10/24/2012   COPD (chronic obstructive pulmonary disease) ([place]) 10/23/2012   DIABETIC PERIPHERAL NEUROPATHY 01/09/2010   Type 2 diabetes mellitus with diabetic neuropathy, unspecified ([place]) 01/09/2010   ESRD (end stage renal disease) on dialysis ([place]) 08/30/2009   CEREBROVASCULAR ACCIDENT, HX OF 08/30/2009   Cerebral infarction ([place]) 08/30/2009   Diabetes mellitus type 2 with complications ([place]) 03/54/6568   HYPERLIPIDEMIA 09/21/2008   OBSTRUCTIVE SLEEP APNEA 09/21/2008   ESSENTIAL HYPERTENSION 09/21/2008   Coronary atherosclerosis 09/21/2008   Chronic combined systolic and diastolic heart failure ([place]) 09/21/2008    Past Surgical History:  Procedure Laterality Date   ABDOMINAL AORTOGRAM  W/LOWER EXTREMITY N/A 04/29/2020   Procedure: ABDOMINAL AORTOGRAM W/LOWER EXTREMITY;  Surgeon: Angelia Mould, MD;  Location: [place] CV LAB;  Service: Cardiovascular;  Laterality: N/A;   AMPUTATION Left 05/02/2020   Procedure: AMPUTATION BELOW KNEE;  Surgeon: Rosetta Posner, MD;  Location: [place];  Service: Vascular;  Laterality: Left;   AV FISTULA PLACEMENT  10/17/2012   Procedure: ARTERIOVENOUS (AV) FISTULA CREATION;  Surgeon: Mal Misty, MD;  Location: [place];  Service: Vascular;  Laterality: Left;   BONE EXOSTOSIS EXCISION Right 06/04/2019   Procedure: EXOSTOSIS EXCISION;  Surgeon: Leanora Cover, MD;  Location: [facility];  Service: Orthopedics;  Laterality: Right;   CAPD INSERTION N/A 03/15/2014   Procedure: LAPAROSCOPIC INSERTION CONTINUOUS AMBULATORY PERITONEAL DIALYSIS CATHETER, LAPARASCOPIC INCISIONAL HERNIA  REPAIR  WITH MESH, OMENTOPEXY AND LYSIS OF ADHESIONS;  Surgeon: [name], MD;  Location: [place];  Service: General;  Laterality: N/A;   CORONARY ANGIOPLASTY WITH STENT PLACEMENT  ~ 2002   "3"   [place]  01/24/2004   successful PCI/stenting RCA  drug eluting cypher stent   CORONARY ANGIOPLASTY WITH STENT PLACEMENT  01/28/2004   successful stentin of a large bifurcation marginal branch of the ramus intermediate vessel   FLEXIBLE SIGMOIDOSCOPY N/A 09/05/2014   Procedure: FLEXIBLE SIGMOIDOSCOPY;  Surgeon: Cleotis Nipper, MD;  Location: [place];  Service: Endoscopy;  Laterality: N/A;   HERNIA REPAIR     INCISION AND DRAINAGE ABSCESS N/A 09/06/2014   Procedure: REMOVAL OF PD CATH;  Surgeon: Coralie Keens, MD;  Location: [place];  Service: General;  Laterality: N/A;   INCISIONAL HERNIA REPAIR  03/15/2014   LAPAROSCOPIC LYSIS OF ADHESIONS  03/15/2014   NM MYOCAR PERF WALL MOTION  02/06/2012   normal perfusion scan   PERITONEAL CATHETER INSERTION  03/15/2014   REFRACTIVE SURGERY Bilateral    SHUNTOGRAM N/A 03/11/2013    Procedure: Fistulogram;  Surgeon: Conrad Springdale, MD;  Location[facility] CATH LAB;  Service: Cardiovascular;  Laterality: N/A;   TENDON TRANSFER Right 06/04/2019   Procedure: RIGHT HAND EXTENSOR TENDON TRANSFER TO SMALL FINGER;  Surgeon: Leanora Cover, MD;  Location: [place];  Service: Orthopedics;  Laterality: Right;   UMBILICAL HERNIA REPAIR         Family History  Problem Relation Age of Onset   Asthma Mother    Hyperlipidemia Mother    Hypertension Mother    Stroke Father    Heart attack Father    Prostate cancer Father    Deep vein thrombosis Father    Cancer Father    Diabetes Father    Hyperlipidemia Father    Hypertension Father    Other Father        varicose veins   Heart disease Father        before age 7   Other Sister        varicose veins    Social History   Tobacco Use   Smoking status: Former    Packs/day: 1.00    Years: 15.00    Pack years: 15.00    Types: Cigarettes    Quit date: 09/24/1986    Years since quitting: 35.0   Smokeless tobacco: Never  Vaping Use   Vaping Use: Never used  Substance Use Topics   Alcohol use: Not Currently    Comment: stopped in 2013   Drug use: No    Home Medications Prior to Admission medications   Medication Sig Start Date End Date Taking? Authorizing Provider  acetaminophen (TYLENOL) 500 MG tablet Take 1,000 mg by mouth every 6 (six) hours as needed for headache (pain).   Yes [provider]  albuterol (ACCUNEB) 0.63 MG/3ML nebulizer solution Take 1 ampule by nebulization every 6 (six) hours as needed for wheezing.   Yes [provider]  albuterol (PROVENTIL HFA;VENTOLIN HFA) 108 (90 BASE) MCG/ACT inhaler Inhale 2 puffs into the lungs every 6 (six) hours as needed (COPD).   Yes [provider]  aspirin 325 MG tablet Take 325 mg by mouth daily.   Yes [provider]  atorvastatin (LIPITOR) 80 MG tablet Take 80 mg by mouth daily after supper.   Yes [provider]   B Complex Vitamins (VITAMIN B COMPLEX) TABS Take 1 tablet by mouth every Monday, Wednesday, and Friday. Dialysis days   Yes [provider]  carvedilol (COREG) 6.25 MG tablet Take 1 tablet (6.25 mg total) by mouth 2 (two) times daily with a meal. 05/27/20  Yes Love, Ivan Anchors, PA-C  cinacalcet (SENSIPAR) 90 MG tablet Take 90 mg by mouth daily.   Yes [provider]  epoetin alfa (EPOGEN) 10000 UNIT/ML injection Inject 10,000 Units into the skin every 3 (three) days.   Yes [provider]  fluticasone (FLONASE) 50 MCG/ACT nasal spray Place 1 spray into both nostrils daily.   Yes [provider]  folic acid (FOLVITE) 062 MCG tablet Take 400 mcg by mouth every Monday, Wednesday, and Friday. Dialysis days   Yes [provider]  hydrOXYzine (ATARAX) 25 MG tablet Take 25 mg by mouth every 6 (six) hours as needed for itching (allergies).   Yes [provider]  insulin lispro (HUMALOG) 100 UNIT/ML injection Inject 2-12 Units into the skin See admin instructions. Inject 2-12 units subcutaneously three times daily before meals per sliding scale - CBG 201-250 2 units, 251-300 4 units, 301-350 6 units, 351-400 8 units, 401-450 10 units, >450 12 units and call MD   Yes [provider]  lactulose (CHRONULAC) 10 GM/15ML solution Take 20 g by mouth daily as needed (constipation).   Yes [provider]  lanthanum (FOSRENOL) 500 MG chewable tablet Chew 500 mg by mouth See admin instructions. Chew one tablet (500 mg) by mouth with the largest meal of the day   Yes [provider]  loperamide (IMODIUM) 2 MG capsule Take 2 mg by mouth daily as needed for diarrhea or loose stools.   Yes [provider]  metoCLOPramide (REGLAN) 5 MG tablet Take 5 mg by mouth 3 (three) times daily as needed for nausea.   Yes [provider]  midodrine (PROAMATINE) 5 MG tablet Take 5 mg by mouth 3 (three) times daily after meals. Hold for SBP >130    Yes [provider]  nitroGLYCERIN (NITROSTAT) 0.4 MG SL tablet Place 0.4 mg under the tongue every 5 (five) minutes as needed for chest pain.   Yes [provider]  pantoprazole (PROTONIX) 40 MG tablet Take 1 tablet (40 mg total) by mouth daily. Patient taking differently: Take 40 mg by mouth daily at 6 (six) AM. 05/07/20  Yes Simmons-Robinson, Makiera, MD  polyethylene glycol (MIRALAX / GLYCOLAX) packet Take 17 g by mouth daily. Patient taking differently: Take 17 g by mouth daily as needed (constipation). Mix with 6 oz fluid 09/08/14  Yes Gottschalk, Ashly M, DO  tiotropium (SPIRIVA) 18 MCG inhalation capsule Place 18 mcg into inhaler and inhale daily as needed (COPD).   Yes [provider]  vitamin C (ASCORBIC ACID) 500 MG tablet Take 500 mg by mouth daily.   Yes [provider]    Allergies    Ferumoxytol, Iron, Iodinated contrast media, and Sildenafil  Review of Systems   Review of Systems  Constitutional:  Negative for fever.  HENT:  Negative for ear pain and sore throat.   Eyes:  Negative for pain.  Respiratory:  Positive for shortness of breath. Negative for cough.   Cardiovascular:  Positive for chest pain.  Gastrointestinal:  Positive for abdominal pain.  Genitourinary:  Negative for flank pain.  Musculoskeletal:  Negative for back pain.  Skin:  Negative for color change and rash.  Neurological:  Negative for syncope.  All other systems reviewed and are negative.  Physical Exam Updated Vital Signs BP 100/83    Pulse 85    Temp 97.8 F (36.6 C) (Oral)    Resp (!) 21    Ht 5\' 11"  (1.803 m)    Wt 111.1 kg    SpO2  99%    BMI 34.17 kg/m   Physical Exam Constitutional:      Appearance: He is well-developed.  HENT:     Head: Normocephalic.     Nose: Nose normal.  Eyes:     Extraocular Movements: Extraocular movements intact.  Cardiovascular:     Rate and Rhythm: Normal rate.  Pulmonary:     Effort: Pulmonary effort is normal.   Abdominal:     Comments: Obese abdomen, tenderness in the mid abdominal region.  No guarding or rebound.  Skin:    Coloration: Skin is not jaundiced.  Neurological:     General: No focal deficit present.     Mental Status: He is alert and oriented to person, place, and time. Mental status is at baseline.     Cranial Nerves: No cranial nerve deficit.     Motor: No weakness.    ED Results / Procedures / Treatments   Labs (all labs ordered are listed, but only abnormal results are displayed) Labs Reviewed  CBC WITH DIFFERENTIAL/PLATELET - Abnormal; Notable for the following components:      Result Value   RBC 3.66 (*)    Hemoglobin 9.3 (*)    HCT 30.6 (*)    MCH 25.4 (*)    RDW 21.8 (*)    Platelets 71 (*)    All other components within normal limits  COMPREHENSIVE METABOLIC PANEL - Abnormal; Notable for the following components:   Chloride 96 (*)    Glucose, Bld 65 (*)    Creatinine, Ser 4.44 (*)    Calcium 7.3 (*)    Albumin 2.7 (*)    AST 49 (*)    Alkaline Phosphatase 147 (*)    Total Bilirubin 2.1 (*)    GFR, Estimated 14 (*)    All other components within normal limits  LACTIC ACID, PLASMA - Abnormal; Notable for the following components:   Lactic Acid, Venous 4.0 (*)    All other components within normal limits  TROPONIN I (HIGH SENSITIVITY) - Abnormal; Notable for the following components:   Troponin I (High Sensitivity) 23 (*)    All other components within normal limits  CULTURE, BLOOD (ROUTINE X 2)  CULTURE, BLOOD (ROUTINE X 2)  RESP PANEL BY RT-PCR (FLU A&B, COVID) ARPGX2  LIPASE, BLOOD  LACTIC ACID, PLASMA  TROPONIN I (HIGH SENSITIVITY)    EKG EKG Interpretation  Date/Time:  Friday September 15 2021 16:50:14 EST Ventricular Rate:  85 PR Interval:  79 QRS Duration: 200 QT Interval:  536 QTC Calculation: 638 R Axis:   -70 Text Interpretation: Sinus rhythm Short PR interval IVCD, consider atypical LBBB Confirmed by Thamas Jaegers ([id number]) on 09/15/2021  6:19:07 PM  Radiology DG Chest Port 1 View  Result Date: 09/15/2021 CLINICAL DATA:  Shortness of breath EXAM: PORTABLE CHEST 1 VIEW COMPARISON:  Chest radiograph dated December 03, 2020 FINDINGS: The heart is markedly enlarged. Pulmonary vascular congestion. Moderate bilateral pleural effusions. Dialysis catheter with distal tip in the right atrium. IMPRESSION: Cardiomegaly with pulmonary vascular congestion and moderate bilateral pleural effusions, suggesting volume overload/pulmonary edema. Left access dialysis catheter with distal tip in the right atrium. Electronically Signed   By: Keane Police D.O.   On: 09/15/2021 18:00   CT CHEST ABDOMEN PELVIS WO CONTRAST  Result Date: 09/15/2021 CLINICAL DATA:  Possible sepsis, shortness of breath and abdominal pain, initial encounter EXAM: CT CHEST, ABDOMEN AND PELVIS WITHOUT CONTRAST TECHNIQUE: Multidetector CT imaging of the chest, abdomen and pelvis was performed following  the standard protocol without IV contrast. COMPARISON:  09/13/2021 FINDINGS: CT CHEST FINDINGS Cardiovascular: Heart is not significantly enlarged in size. Heavy coronary calcifications are noted. Mild atherosclerotic calcifications of the thoracic aorta are noted. Changes of prior coronary bypass grafting are seen. Left jugular dialysis catheter is noted in satisfactory position. Mediastinum/Nodes: Thoracic inlet is within normal limits. The esophagus is within normal limits. No sizable hilar or mediastinal adenopathy is noted. Lungs/Pleura: Large bilateral pleural effusions are noted right slightly greater than left. These of increased in the interval from the prior exam. Lower lobe consolidation is noted stable from the prior study. No sizable parenchymal nodule is seen. Musculoskeletal: Degenerative changes of the thoracic spine are noted. No acute rib abnormality is seen. No compression deformity is noted. Postsurgical changes in the sternum are seen. CT ABDOMEN PELVIS FINDINGS  Hepatobiliary: No focal liver abnormality is seen. Gallbladder is decompressed but stable. Pancreas: Unremarkable. No pancreatic ductal dilatation or surrounding inflammatory changes. Spleen: Normal in size without focal abnormality. Adrenals/Urinary Tract: Adrenal glands are within normal limits. Kidneys are well visualized bilaterally. No renal calculi or obstructive changes are noted. The bladder is decompressed. Stomach/Bowel: Diverticular changes noted without evidence of diverticulitis. Previously seen postsurgical changes in the sigmoid are less well appreciated on today's exam due to contrast material throughout the sigmoid. More proximal colon is decompressed. No obstructive or inflammatory changes are seen. The appendix is within normal limits. Vascular/Lymphatic: Aortic atherosclerosis. No enlarged abdominal or pelvic lymph nodes. Reproductive: Prostate is unremarkable. Other: Mild free fluid is noted stable in appearance from the prior exam. Considerable changes of anasarca are noted within the abdominal wall slightly increased when compared with the prior exam. Musculoskeletal: Degenerative changes of lumbar spine are seen. IMPRESSION: CT of the chest: Bilateral pleural effusions right considerably greater than left and increased from the prior examination from 2 days previous. Associated lower lobe consolidation is noted. CT of the abdomen and pelvis: Diverticulosis without diverticulitis. Free fluid within the abdomen stable from the prior exam. Increasing changes of anasarca in the abdominal wall. Aortic Atherosclerosis (ICD10-I70.0). Electronically Signed   By: Inez Catalina M.D.   On: 09/15/2021 20:33    Procedures .Critical Care Performed by: Luna Fuse, MD Authorized by: Luna Fuse, MD   Critical care provider statement:    Critical care time (minutes):  40   Critical care time was exclusive of:  Separately billable procedures and treating other patients and teaching time    Critical care was necessary to treat or prevent imminent or life-threatening deterioration of the following conditions:  Shock and sepsis   Medications Ordered in ED Medications  lactated ringers bolus 1,000 mL (0 mLs Intravenous Stopped 09/15/21 2120)  piperacillin-tazobactam (ZOSYN) IVPB 3.375 g (3.375 g Intravenous New Bag/Given 09/15/21 2120)    ED Course  I have reviewed the triage vital signs and the nursing notes.  Pertinent labs & imaging results that were available during my care of the patient were reviewed by me and considered in my medical decision making (see chart for details).    MDM Rules/Calculators/A&P                         Patient arrived hypotensive.  Sepsis work-up initiated IV fluids started.  Labs show normal white count.  Some anemia is present.  Lactic acid have elevated 4.0.  Patient otherwise afebrile.  Blood pressure does remain low.  Given a liter bolus with improvement of blood pressure.  Given lactic acidosis low blood pressure and consolidated and hypoxemia, started on IV antibiotics after blood cultures.  CT abdomen pelvis pursued with possible consolidation and bilateral pleural effusions present.  Anasarca present as well.  Case discussed with hospitalist for admission.  Case also discussed with nephrology to consult on the patient as well.      Final Clinical Impression(s) / ED Diagnoses Final diagnoses:  Dyspnea, unspecified type  Lactic acidosis  Hypotension, unspecified hypotension type    Rx / DC Orders ED Discharge Orders     None        Luna Fuse, MD 09/15/21 2253

## 2021-09-15 NOTE — ED Notes (Signed)
This RN educated pt about the safety of having the side rail of the stretcher up, especially with him being a high fall risk. Pt understood the safety teaching and stated, "I understand, but I don't want the side rail up, I won't fall. I feel trapped with it up."

## 2021-09-16 ENCOUNTER — Observation Stay (HOSPITAL_COMMUNITY): Payer: No Typology Code available for payment source

## 2021-09-16 ENCOUNTER — Encounter (HOSPITAL_COMMUNITY): Payer: Self-pay | Admitting: *Deleted

## 2021-09-16 DIAGNOSIS — Z7982 Long term (current) use of aspirin: Secondary | ICD-10-CM | POA: Diagnosis not present

## 2021-09-16 DIAGNOSIS — D696 Thrombocytopenia, unspecified: Secondary | ICD-10-CM

## 2021-09-16 DIAGNOSIS — I252 Old myocardial infarction: Secondary | ICD-10-CM | POA: Diagnosis not present

## 2021-09-16 DIAGNOSIS — E8770 Fluid overload, unspecified: Secondary | ICD-10-CM | POA: Diagnosis not present

## 2021-09-16 DIAGNOSIS — Z794 Long term (current) use of insulin: Secondary | ICD-10-CM | POA: Diagnosis not present

## 2021-09-16 DIAGNOSIS — E872 Acidosis, unspecified: Secondary | ICD-10-CM

## 2021-09-16 DIAGNOSIS — D631 Anemia in chronic kidney disease: Secondary | ICD-10-CM

## 2021-09-16 DIAGNOSIS — I428 Other cardiomyopathies: Secondary | ICD-10-CM | POA: Diagnosis present

## 2021-09-16 DIAGNOSIS — Z992 Dependence on renal dialysis: Secondary | ICD-10-CM | POA: Diagnosis not present

## 2021-09-16 DIAGNOSIS — E11311 Type 2 diabetes mellitus with unspecified diabetic retinopathy with macular edema: Secondary | ICD-10-CM | POA: Diagnosis present

## 2021-09-16 DIAGNOSIS — M898X9 Other specified disorders of bone, unspecified site: Secondary | ICD-10-CM | POA: Diagnosis present

## 2021-09-16 DIAGNOSIS — I5043 Acute on chronic combined systolic (congestive) and diastolic (congestive) heart failure: Secondary | ICD-10-CM | POA: Diagnosis present

## 2021-09-16 DIAGNOSIS — I959 Hypotension, unspecified: Secondary | ICD-10-CM

## 2021-09-16 DIAGNOSIS — R531 Weakness: Secondary | ICD-10-CM | POA: Diagnosis not present

## 2021-09-16 DIAGNOSIS — Z9889 Other specified postprocedural states: Secondary | ICD-10-CM

## 2021-09-16 DIAGNOSIS — Z20822 Contact with and (suspected) exposure to covid-19: Secondary | ICD-10-CM | POA: Diagnosis present

## 2021-09-16 DIAGNOSIS — I132 Hypertensive heart and chronic kidney disease with heart failure and with stage 5 chronic kidney disease, or end stage renal disease: Secondary | ICD-10-CM | POA: Diagnosis present

## 2021-09-16 DIAGNOSIS — Z89512 Acquired absence of left leg below knee: Secondary | ICD-10-CM

## 2021-09-16 DIAGNOSIS — E118 Type 2 diabetes mellitus with unspecified complications: Secondary | ICD-10-CM | POA: Diagnosis not present

## 2021-09-16 DIAGNOSIS — R0609 Other forms of dyspnea: Secondary | ICD-10-CM | POA: Diagnosis not present

## 2021-09-16 DIAGNOSIS — I2510 Atherosclerotic heart disease of native coronary artery without angina pectoris: Secondary | ICD-10-CM | POA: Diagnosis present

## 2021-09-16 DIAGNOSIS — E8779 Other fluid overload: Secondary | ICD-10-CM | POA: Diagnosis not present

## 2021-09-16 DIAGNOSIS — N2581 Secondary hyperparathyroidism of renal origin: Secondary | ICD-10-CM | POA: Diagnosis present

## 2021-09-16 DIAGNOSIS — Z6834 Body mass index (BMI) 34.0-34.9, adult: Secondary | ICD-10-CM | POA: Diagnosis not present

## 2021-09-16 DIAGNOSIS — R0600 Dyspnea, unspecified: Secondary | ICD-10-CM

## 2021-09-16 DIAGNOSIS — I442 Atrioventricular block, complete: Secondary | ICD-10-CM | POA: Diagnosis present

## 2021-09-16 DIAGNOSIS — Z515 Encounter for palliative care: Secondary | ICD-10-CM | POA: Diagnosis not present

## 2021-09-16 DIAGNOSIS — E669 Obesity, unspecified: Secondary | ICD-10-CM | POA: Diagnosis present

## 2021-09-16 DIAGNOSIS — I700 Atherosclerosis of aorta: Secondary | ICD-10-CM | POA: Diagnosis present

## 2021-09-16 DIAGNOSIS — J9601 Acute respiratory failure with hypoxia: Secondary | ICD-10-CM | POA: Diagnosis present

## 2021-09-16 DIAGNOSIS — Z79899 Other long term (current) drug therapy: Secondary | ICD-10-CM | POA: Diagnosis not present

## 2021-09-16 DIAGNOSIS — I5082 Biventricular heart failure: Secondary | ICD-10-CM | POA: Diagnosis present

## 2021-09-16 DIAGNOSIS — E11649 Type 2 diabetes mellitus with hypoglycemia without coma: Secondary | ICD-10-CM | POA: Diagnosis present

## 2021-09-16 DIAGNOSIS — J90 Pleural effusion, not elsewhere classified: Secondary | ICD-10-CM

## 2021-09-16 DIAGNOSIS — N186 End stage renal disease: Secondary | ICD-10-CM | POA: Diagnosis present

## 2021-09-16 LAB — COMPREHENSIVE METABOLIC PANEL
ALT: 33 U/L (ref 0–44)
AST: 54 U/L — ABNORMAL HIGH (ref 15–41)
Albumin: 2.6 g/dL — ABNORMAL LOW (ref 3.5–5.0)
Alkaline Phosphatase: 142 U/L — ABNORMAL HIGH (ref 38–126)
Anion gap: 13 (ref 5–15)
BUN: 13 mg/dL (ref 8–23)
CO2: 25 mmol/L (ref 22–32)
Calcium: 7.2 mg/dL — ABNORMAL LOW (ref 8.9–10.3)
Chloride: 96 mmol/L — ABNORMAL LOW (ref 98–111)
Creatinine, Ser: 5.01 mg/dL — ABNORMAL HIGH (ref 0.61–1.24)
GFR, Estimated: 12 mL/min — ABNORMAL LOW (ref >60)
Glucose, Bld: 71 mg/dL (ref 70–99)
Potassium: 3.9 mmol/L (ref 3.5–5.1)
Sodium: 134 mmol/L — ABNORMAL LOW (ref 135–145)
Total Bilirubin: 2 mg/dL — ABNORMAL HIGH (ref 0.3–1.2)
Total Protein: 7.4 g/dL (ref 6.5–8.1)

## 2021-09-16 LAB — CBG MONITORING, ED
Glucose-Capillary: 53 mg/dL — ABNORMAL LOW (ref 70–99)
Glucose-Capillary: 63 mg/dL — ABNORMAL LOW (ref 70–99)
Glucose-Capillary: 99 mg/dL (ref 70–99)

## 2021-09-16 LAB — RENAL FUNCTION PANEL
Albumin: 2.4 g/dL — ABNORMAL LOW (ref 3.5–5.0)
Anion gap: 13 (ref 5–15)
BUN: 16 mg/dL (ref 8–23)
CO2: 25 mmol/L (ref 22–32)
Calcium: 7.3 mg/dL — ABNORMAL LOW (ref 8.9–10.3)
Chloride: 97 mmol/L — ABNORMAL LOW (ref 98–111)
Creatinine, Ser: 5.44 mg/dL — ABNORMAL HIGH (ref 0.61–1.24)
GFR, Estimated: 11 mL/min — ABNORMAL LOW
Glucose, Bld: 107 mg/dL — ABNORMAL HIGH (ref 70–99)
Phosphorus: 3.4 mg/dL (ref 2.5–4.6)
Potassium: 4.2 mmol/L (ref 3.5–5.1)
Sodium: 135 mmol/L (ref 135–145)

## 2021-09-16 LAB — HEPATITIS B SURFACE ANTIBODY,QUALITATIVE: Hep B S Ab: REACTIVE — AB

## 2021-09-16 LAB — CBC
HCT: 30.4 % — ABNORMAL LOW (ref 39.0–52.0)
Hemoglobin: 9.6 g/dL — ABNORMAL LOW (ref 13.0–17.0)
MCH: 26.4 pg (ref 26.0–34.0)
MCHC: 31.6 g/dL (ref 30.0–36.0)
MCV: 83.5 fL (ref 80.0–100.0)
Platelets: 73 10*3/uL — ABNORMAL LOW (ref 150–400)
RBC: 3.64 MIL/uL — ABNORMAL LOW (ref 4.22–5.81)
RDW: 22.1 % — ABNORMAL HIGH (ref 11.5–15.5)
WBC: 4.9 10*3/uL (ref 4.0–10.5)
nRBC: 0 % (ref 0.0–0.2)

## 2021-09-16 LAB — HIV ANTIBODY (ROUTINE TESTING W REFLEX): HIV Screen 4th Generation wRfx: NONREACTIVE

## 2021-09-16 LAB — LACTIC ACID, PLASMA: Lactic Acid, Venous: 5.2 mmol/L (ref 0.5–1.9)

## 2021-09-16 LAB — TROPONIN I (HIGH SENSITIVITY): Troponin I (High Sensitivity): 20 ng/L — ABNORMAL HIGH (ref <18)

## 2021-09-16 LAB — HEPATITIS B SURFACE ANTIGEN: Hepatitis B Surface Ag: NONREACTIVE

## 2021-09-16 LAB — RESP PANEL BY RT-PCR (FLU A&B, COVID) ARPGX2
Influenza A by PCR: NEGATIVE
Influenza B by PCR: NEGATIVE
SARS Coronavirus 2 by RT PCR: NEGATIVE

## 2021-09-16 LAB — MAGNESIUM: Magnesium: 2.1 mg/dL (ref 1.7–2.4)

## 2021-09-16 MED ORDER — LIDOCAINE-PRILOCAINE 2.5-2.5 % EX CREA: 1.0000 "application " | TOPICAL_CREAM | CUTANEOUS | Status: DC | PRN

## 2021-09-16 MED ORDER — MIDODRINE HCL 5 MG PO TABS
10.0000 mg | ORAL_TABLET | Freq: Once | ORAL | Status: AC
  Administered 2021-09-16: 09:00:00 10 mg via ORAL
  Filled 2021-09-16: qty 2

## 2021-09-16 MED ORDER — HEPARIN SODIUM (PORCINE) 1000 UNIT/ML DIALYSIS
10000.0000 [IU] | Freq: Once | INTRAMUSCULAR | Status: AC
  Administered 2021-09-17: 10000 [IU] via INTRAVENOUS_CENTRAL
  Filled 2021-09-16: qty 10

## 2021-09-16 MED ORDER — DEXTROSE 10 % IV SOLN: INTRAVENOUS | Status: DC

## 2021-09-16 MED ORDER — ASPIRIN EC 81 MG PO TBEC
81.0000 mg | DELAYED_RELEASE_TABLET | Freq: Every day | ORAL | Status: DC
  Administered 2021-09-17 – 2021-09-18 (×2): 81 mg via ORAL
  Filled 2021-09-16 (×2): qty 1

## 2021-09-16 MED ORDER — SODIUM CHLORIDE 0.9 % IV SOLN: 100.0000 mL | INTRAVENOUS | Status: DC | PRN

## 2021-09-16 MED ORDER — LIDOCAINE HCL (PF) 1 % IJ SOLN: 5.0000 mL | INTRAMUSCULAR | Status: DC | PRN

## 2021-09-16 MED ORDER — DOXERCALCIFEROL 4 MCG/2ML IV SOLN
3.0000 ug | INTRAVENOUS | Status: DC
  Administered 2021-09-20: 3 ug via INTRAVENOUS
  Filled 2021-09-16 (×4): qty 2

## 2021-09-16 MED ORDER — LIDOCAINE HCL (PF) 1 % IJ SOLN
INTRAMUSCULAR | Status: AC
  Filled 2021-09-16: qty 30

## 2021-09-16 MED ORDER — LIDOCAINE-EPINEPHRINE 1 %-1:100000 IJ SOLN
INTRAMUSCULAR | Status: AC
  Filled 2021-09-16: qty 1

## 2021-09-16 MED ORDER — ALTEPLASE 2 MG IJ SOLR
2.0000 mg | Freq: Once | INTRAMUSCULAR | Status: DC | PRN
  Filled 2021-09-16: qty 2

## 2021-09-16 MED ORDER — DARBEPOETIN ALFA 40 MCG/0.4ML IJ SOSY
40.0000 ug | PREFILLED_SYRINGE | Freq: Once | INTRAMUSCULAR | Status: AC
  Administered 2021-09-18: 40 ug via INTRAVENOUS
  Filled 2021-09-16 (×2): qty 0.4

## 2021-09-16 MED ORDER — MIDODRINE HCL 5 MG PO TABS
10.0000 mg | ORAL_TABLET | Freq: Three times a day (TID) | ORAL | Status: DC
  Administered 2021-09-16 – 2021-09-22 (×18): 10 mg via ORAL
  Filled 2021-09-16 (×19): qty 2

## 2021-09-16 MED ORDER — PHENYLEPHRINE HCL-NACL 20-0.9 MG/250ML-% IV SOLN
INTRAVENOUS | Status: AC
  Filled 2021-09-16: qty 250

## 2021-09-16 MED ORDER — CHLORHEXIDINE GLUCONATE CLOTH 2 % EX PADS
6.0000 | MEDICATED_PAD | Freq: Every day | CUTANEOUS | Status: DC
  Administered 2021-09-17 – 2021-09-22 (×5): 6 via TOPICAL

## 2021-09-16 MED ORDER — PENTAFLUOROPROP-TETRAFLUOROETH EX AERO
1.0000 "application " | INHALATION_SPRAY | CUTANEOUS | Status: DC | PRN
  Filled 2021-09-16: qty 116

## 2021-09-16 MED ORDER — HEPARIN SODIUM (PORCINE) 1000 UNIT/ML DIALYSIS
1000.0000 [IU] | INTRAMUSCULAR | Status: DC | PRN
  Filled 2021-09-16: qty 1

## 2021-09-16 NOTE — Consult Note (Addendum)
[facility] Renal Consultation Note    Indication for Consultation:  Management of ESRD/hemodialysis; anemia, hypertension/volume and secondary hyperparathyroidism PCP: Thayer Dallas   HPI: Jonathon Bailey is a 65 y.o. male with ESRD on dialysis at [facility], now on MWF schedule. PMH: ESRD D/T  FSGS, HFrEF, CAD S/P CABG, high grade AVB S/P leadless PPM. AS/severe TR, HTN, obesity, DMT2. Recent Adm to [facility] 12/12-12/22/22 for AoC HF. Was discharged to SNF. Went to OP HD center 09/15/2021. Arrived under EDW, not challenged. Presented to ED post HD with C/O SOB. CXR with moderate bilateral pleural effusions/vascular congestion. He has been admitted as observation patient acute on chronic systolic HF. Volume removal has been impeded by hypotension. Increase midodrine and attempt serial HD to lower volume. May require thoracentesis. Seen in ED. Wearing O2 but O2 sats actually 100% on RA. Does have RLE edema, lungs sound decreased in bases otherwise clear. C/O nausea but no emesis. Says chest hurts when he takes a deep breath, localizing pain to mid chest/sternal area. HD later today.   Past Medical History:  Diagnosis Date   Anxiety    Arthritis    Asthma    "as a child"   CAD (coronary artery disease)    Chronic systolic heart failure ([place]) 09/21/2008   ECHO Feb 2013 showed LVEF low normal at 50-55%, +hypokinetic anterolateral wall and inferolateral wall.     CKD (chronic kidney disease) stage 4, GFR 15-29 ml/min (HCC) 08/30/2009   Progressive renal failure since 2008, creatinine 1.2 in 2008 up to 3.5 in 2012 and 3.2-5.0 in 2013. All UA's 2011-13 showed >300 protein on dipstick. Work-up in May 2011 showed negative Urine IFE and SPEP, ultrasound showed 12-13 cm kidneys with increased echogenicity and UPC ratio was 1.5 gm proteinuria.  Hgb A1C's from 2011 to 2013 were all between 9-11.  Patient saw Dr. Donnetta Hutching (vasc surgery) for HD access in Aug  2013 > vein mapping was done and Dr. Donnetta Hutching felt the left arm (pt is R handed) was suitable for L arm Cimino radiocephalic fistula. Patient said he wasn't ready to consider doing dialysis and declined the surgery.      Depression    Diabetic retinopathy ([place])    ESRD (end stage renal disease) on dialysis ([place])    Headache(784.0)    "maybe monthly" (03/15/2014)   Hyperlipidemia    Hypertension    Macular edema    Myocardial infarction [facility])    status post MI x2 and 3 stents placed in 2003   Obesity    Sleep apnea    does not use CPAP   Stroke ([place]) ~ 2007; ~1987   "weak on right side; messed w/right side of brain; cry all the time"   Type II diabetes mellitus ([place])    insulin dependent   Past Surgical History:  Procedure Laterality Date   ABDOMINAL AORTOGRAM W/LOWER EXTREMITY N/A 04/29/2020   Procedure: ABDOMINAL AORTOGRAM W/LOWER EXTREMITY;  Surgeon: Angelia Mould, MD;  Location: [place] CV LAB;  Service: Cardiovascular;  Laterality: N/A;   AMPUTATION Left 05/02/2020   Procedure: AMPUTATION BELOW KNEE;  Surgeon: Rosetta Posner, MD;  Location: [place];  Service: Vascular;  Laterality: Left;   AV FISTULA PLACEMENT  10/17/2012   Procedure: ARTERIOVENOUS (AV) FISTULA CREATION;  Surgeon: Mal Misty, MD;  Location: [place];  Service: Vascular;  Laterality: Left;   BONE EXOSTOSIS EXCISION Right 06/04/2019   Procedure: EXOSTOSIS EXCISION;  Surgeon: Fredna Dow,  Lennette Bihari, MD;  Location: [place];  Service: Orthopedics;  Laterality: Right;   CAPD INSERTION N/A 03/15/2014   Procedure: LAPAROSCOPIC INSERTION CONTINUOUS AMBULATORY PERITONEAL DIALYSIS CATHETER, LAPARASCOPIC INCISIONAL HERNIA  REPAIR  WITH MESH, OMENTOPEXY AND LYSIS OF ADHESIONS;  Surgeon: [name], MD;  Location: [place];  Service: General;  Laterality: N/A;   CORONARY ANGIOPLASTY WITH STENT PLACEMENT  ~ 2002   "3"   CORONARY ANGIOPLASTY WITH STENT PLACEMENT  01/24/2004   successful PCI/stenting RCA  drug eluting  cypher stent   CORONARY ANGIOPLASTY WITH STENT PLACEMENT  01/28/2004   successful stentin of a large bifurcation marginal branch of the ramus intermediate vessel   FLEXIBLE SIGMOIDOSCOPY N/A 09/05/2014   Procedure: FLEXIBLE SIGMOIDOSCOPY;  Surgeon: Cleotis Nipper, MD;  Location: [place];  Service: Endoscopy;  Laterality: N/A;   HERNIA REPAIR     INCISION AND DRAINAGE ABSCESS N/A 09/06/2014   Procedure: REMOVAL OF PD CATH;  Surgeon: Coralie Keens, MD;  Location: [place];  Service: General;  Laterality: N/A;   INCISIONAL HERNIA REPAIR  03/15/2014   LAPAROSCOPIC LYSIS OF ADHESIONS  03/15/2014   NM MYOCAR PERF WALL MOTION  02/06/2012   normal perfusion scan   PERITONEAL CATHETER INSERTION  03/15/2014   REFRACTIVE SURGERY Bilateral    SHUNTOGRAM N/A 03/11/2013   Procedure: Fistulogram;  Surgeon: Conrad Hanover, MD;  Location: [facility] CATH LAB;  Service: Cardiovascular;  Laterality: N/A;   TENDON TRANSFER Right 06/04/2019   Procedure: RIGHT HAND EXTENSOR TENDON TRANSFER TO SMALL FINGER;  Surgeon: Leanora Cover, MD;  Location: [place];  Service: Orthopedics;  Laterality: Right;   UMBILICAL HERNIA REPAIR     Family History  Problem Relation Age of Onset   Asthma Mother    Hyperlipidemia Mother    Hypertension Mother    Stroke Father    Heart attack Father    Prostate cancer Father    Deep vein thrombosis Father    Cancer Father    Diabetes Father    Hyperlipidemia Father    Hypertension Father    Other Father        varicose veins   Heart disease Father        before age 46   Other Sister        varicose veins   Social History:  reports that he quit smoking about 35 years ago. His smoking use included cigarettes. He has a 15.00 pack-year smoking history. He has never used smokeless tobacco. He reports that he does not currently use alcohol. He reports that he does not use drugs. Allergies  Allergen Reactions   Ferumoxytol Itching        Iron Itching          Iodinated Contrast Media Itching   Sildenafil Other (See Comments)    Other reaction(s): Abnormal sexual function   Prior to Admission medications   Medication Sig Start Date End Date Taking? Authorizing Provider  acetaminophen (TYLENOL) 500 MG tablet Take 1,000 mg by mouth every 6 (six) hours as needed for headache (pain).   Yes [provider]  albuterol (ACCUNEB) 0.63 MG/3ML nebulizer solution Take 1 ampule by nebulization every 6 (six) hours as needed for wheezing.   Yes [provider]  albuterol (PROVENTIL HFA;VENTOLIN HFA) 108 (90 BASE) MCG/ACT inhaler Inhale 2 puffs into the lungs every 6 (six) hours as needed (COPD).   Yes [provider]  aspirin 325 MG tablet Take 325 mg by mouth daily.  Yes [provider]  atorvastatin (LIPITOR) 80 MG tablet Take 80 mg by mouth daily after supper.   Yes [provider]  B Complex Vitamins (VITAMIN B COMPLEX) TABS Take 1 tablet by mouth every Monday, Wednesday, and Friday. Dialysis days   Yes [provider]  cinacalcet (SENSIPAR) 90 MG tablet Take 90 mg by mouth daily.   Yes [provider]  epoetin alfa (EPOGEN) 10000 UNIT/ML injection Inject 10,000 Units into the skin every 3 (three) days.   Yes [provider]  fluticasone (FLONASE) 50 MCG/ACT nasal spray Place 1 spray into both nostrils daily.   Yes [provider]  folic acid (FOLVITE) 361 MCG tablet Take 400 mcg by mouth every Monday, Wednesday, and Friday. Dialysis days   Yes [provider]  hydrOXYzine (ATARAX) 25 MG tablet Take 25 mg by mouth every 6 (six) hours as needed for itching (allergies).   Yes [provider]  insulin lispro (HUMALOG) 100 UNIT/ML injection Inject 2-12 Units into the skin See admin instructions. Inject 2-12 units subcutaneously three times daily before meals per sliding scale - CBG 201-250 2 units, 251-300 4 units, 301-350 6 units, 351-400 8 units, 401-450 10 units, >450  12 units and call MD   Yes [provider]  lactulose (CHRONULAC) 10 GM/15ML solution Take 20 g by mouth daily as needed (constipation).   Yes [provider]  lanthanum (FOSRENOL) 500 MG chewable tablet Chew 500 mg by mouth See admin instructions. Chew one tablet (500 mg) by mouth with the largest meal of the day   Yes [provider]  loperamide (IMODIUM) 2 MG capsule Take 2 mg by mouth daily as needed for diarrhea or loose stools.   Yes [provider]  metoCLOPramide (REGLAN) 5 MG tablet Take 5 mg by mouth 3 (three) times daily as needed for nausea.   Yes [provider]  midodrine (PROAMATINE) 5 MG tablet Take 5 mg by mouth 3 (three) times daily after meals. Hold for SBP >130   Yes [provider]  nitroGLYCERIN (NITROSTAT) 0.4 MG SL tablet Place 0.4 mg under the tongue every 5 (five) minutes as needed for chest pain.   Yes [provider]  pantoprazole (PROTONIX) 40 MG tablet Take 1 tablet (40 mg total) by mouth daily. Patient taking differently: Take 40 mg by mouth daily at 6 (six) AM. 05/07/20  Yes Simmons-Robinson, Makiera, MD  polyethylene glycol (MIRALAX / GLYCOLAX) packet Take 17 g by mouth daily. Patient taking differently: Take 17 g by mouth daily as needed (constipation). Mix with 6 oz fluid 09/08/14  Yes Gottschalk, Ashly M, DO  tiotropium (SPIRIVA) 18 MCG inhalation capsule Place 18 mcg into inhaler and inhale daily as needed (COPD).   Yes [provider]  vitamin C (ASCORBIC ACID) 500 MG tablet Take 500 mg by mouth daily.   Yes [provider]   Current Facility-Administered Medications  Medication Dose Route Frequency Provider Last Rate Last Admin   0.9 %  sodium chloride infusion  100 mL Intravenous PRN Valentina Gu, NP       0.9 %  sodium chloride infusion  100 mL Intravenous PRN Valentina Gu, NP       acetaminophen (TYLENOL) tablet 650 mg  650 mg Oral Q6H PRN Lenore Cordia, MD    650 mg at 09/16/21 0456   Or   acetaminophen (TYLENOL) suppository 650 mg  650 mg Rectal Q6H PRN Lenore Cordia, MD  albuterol (PROVENTIL) (2.5 MG/3ML) 0.083% nebulizer solution 0.63 mg  0.63 mg Nebulization Q6H PRN Zada Finders R, MD       alteplase (CATHFLO ACTIVASE) injection 2 mg  2 mg Intracatheter Once PRN Valentina Gu, NP       aspirin tablet 325 mg  325 mg Oral Daily Zada Finders R, MD   325 mg at 09/16/21 0900   atorvastatin (LIPITOR) tablet 80 mg  80 mg Oral QPC supper Lenore Cordia, MD       Chlorhexidine Gluconate Cloth 2 % PADS 6 each  6 each Topical Q0600 Valentina Gu, NP       Darbepoetin Alfa (ARANESP) injection 40 mcg  40 mcg Intravenous Once Valentina Gu, NP       dextrose (GLUTOSE) 40 % oral gel 31 g  1 Tube Oral PRN Lenore Cordia, MD       doxercalciferol (HECTOROL) injection 3 mcg  3 mcg Intravenous Q T,Th,Sa-HD Valentina Gu, NP       heparin injection 1,000 Units  1,000 Units Dialysis PRN Valentina Gu, NP       heparin injection 10,000 Units  10,000 Units Dialysis Once in dialysis Valentina Gu, NP       hydrOXYzine (ATARAX) tablet 25 mg  25 mg Oral Q6H PRN Lenore Cordia, MD   25 mg at 09/16/21 0335   lidocaine (PF) (XYLOCAINE) 1 % injection 5 mL  5 mL Intradermal PRN Valentina Gu, NP       lidocaine-prilocaine (EMLA) cream 1 application  1 application Topical PRN Valentina Gu, NP       midodrine (PROAMATINE) tablet 10 mg  10 mg Oral TID WC Valentina Gu, NP       ondansetron [facility]) tablet 4 mg  4 mg Oral Q6H PRN Lenore Cordia, MD       Or   ondansetron (ZOFRAN) injection 4 mg  4 mg Intravenous Q6H PRN Lenore Cordia, MD       pentafluoroprop-tetrafluoroeth (GEBAUERS) aerosol 1 application  1 application Topical PRN Valentina Gu, NP       senna-docusate (Senokot-S) tablet 1 tablet  1 tablet Oral QHS PRN Zada Finders R, MD       sodium chloride flush (NS) 0.9 % injection 3 mL   3 mL Intravenous Q12H Lenore Cordia, MD   3 mL at 09/15/21 2357   Current Outpatient Medications  Medication Sig Dispense Refill   acetaminophen (TYLENOL) 500 MG tablet Take 1,000 mg by mouth every 6 (six) hours as needed for headache (pain).     albuterol (ACCUNEB) 0.63 MG/3ML nebulizer solution Take 1 ampule by nebulization every 6 (six) hours as needed for wheezing.     albuterol (PROVENTIL HFA;VENTOLIN HFA) 108 (90 BASE) MCG/ACT inhaler Inhale 2 puffs into the lungs every 6 (six) hours as needed (COPD).     aspirin 325 MG tablet Take 325 mg by mouth daily.     atorvastatin (LIPITOR) 80 MG tablet Take 80 mg by mouth daily after supper.     B Complex Vitamins (VITAMIN B COMPLEX) TABS Take 1 tablet by mouth every Monday, Wednesday, and Friday. Dialysis days     cinacalcet (SENSIPAR) 90 MG tablet Take 90 mg by mouth daily.     epoetin alfa (EPOGEN) 10000 UNIT/ML injection Inject 10,000 Units into the skin every 3 (three) days.     fluticasone (FLONASE) 50 MCG/ACT nasal spray Place 1 spray  into both nostrils daily.     folic acid (FOLVITE) 846 MCG tablet Take 400 mcg by mouth every Monday, Wednesday, and Friday. Dialysis days     hydrOXYzine (ATARAX) 25 MG tablet Take 25 mg by mouth every 6 (six) hours as needed for itching (allergies).     insulin lispro (HUMALOG) 100 UNIT/ML injection Inject 2-12 Units into the skin See admin instructions. Inject 2-12 units subcutaneously three times daily before meals per sliding scale - CBG 201-250 2 units, 251-300 4 units, 301-350 6 units, 351-400 8 units, 401-450 10 units, >450 12 units and call MD     lactulose (CHRONULAC) 10 GM/15ML solution Take 20 g by mouth daily as needed (constipation).     lanthanum (FOSRENOL) 500 MG chewable tablet Chew 500 mg by mouth See admin instructions. Chew one tablet (500 mg) by mouth with the largest meal of the day     loperamide (IMODIUM) 2 MG capsule Take 2 mg by mouth daily as needed for diarrhea or loose stools.      metoCLOPramide (REGLAN) 5 MG tablet Take 5 mg by mouth 3 (three) times daily as needed for nausea.     midodrine (PROAMATINE) 5 MG tablet Take 5 mg by mouth 3 (three) times daily after meals. Hold for SBP >130     nitroGLYCERIN (NITROSTAT) 0.4 MG SL tablet Place 0.4 mg under the tongue every 5 (five) minutes as needed for chest pain.     pantoprazole (PROTONIX) 40 MG tablet Take 1 tablet (40 mg total) by mouth daily. (Patient taking differently: Take 40 mg by mouth daily at 6 (six) AM.) 30 tablet 0   polyethylene glycol (MIRALAX / GLYCOLAX) packet Take 17 g by mouth daily. (Patient taking differently: Take 17 g by mouth daily as needed (constipation). Mix with 6 oz fluid) 14 each 0   tiotropium (SPIRIVA) 18 MCG inhalation capsule Place 18 mcg into inhaler and inhale daily as needed (COPD).     vitamin C (ASCORBIC ACID) 500 MG tablet Take 500 mg by mouth daily.     Labs: Basic Metabolic Panel: Recent Labs  Lab 09/15/21 1826 09/16/21 0427  NA 136 134*  K 3.8 3.9  CL 96* 96*  CO2 27 25  GLUCOSE 65* 71  BUN 10 13  CREATININE 4.44* 5.01*  CALCIUM 7.3* 7.2*   Liver Function Tests: Recent Labs  Lab 09/15/21 1826 09/16/21 0427  AST 49* 54*  ALT 30 33  ALKPHOS 147* 142*  BILITOT 2.1* 2.0*  PROT 7.5 7.4  ALBUMIN 2.7* 2.6*   Recent Labs  Lab 09/15/21 1826  LIPASE 31   No results for input(s): AMMONIA in the last 168 hours. CBC: Recent Labs  Lab 09/15/21 1826 09/16/21 0427  WBC 4.7 4.9  NEUTROABS 2.5  --   HGB 9.3* 9.6*  HCT 30.6* 30.4*  MCV 83.6 83.5  PLT 71* 73*   Cardiac Enzymes: No results for input(s): CKTOTAL, CKMB, CKMBINDEX, TROPONINI in the last 168 hours. CBG: No results for input(s): GLUCAP in the last 168 hours. Iron Studies: No results for input(s): IRON, TIBC, TRANSFERRIN, FERRITIN in the last 72 hours. Studies/Results: DG Chest Port 1 View  Result Date: 09/15/2021 CLINICAL DATA:  Shortness of breath EXAM: PORTABLE CHEST 1 VIEW COMPARISON:  Chest  radiograph dated December 03, 2020 FINDINGS: The heart is markedly enlarged. Pulmonary vascular congestion. Moderate bilateral pleural effusions. Dialysis catheter with distal tip in the right atrium. IMPRESSION: Cardiomegaly with pulmonary vascular congestion and moderate bilateral pleural effusions,  suggesting volume overload/pulmonary edema. Left access dialysis catheter with distal tip in the right atrium. Electronically Signed   By: Keane Police D.O.   On: 09/15/2021 18:00   CT CHEST ABDOMEN PELVIS WO CONTRAST  Result Date: 09/15/2021 CLINICAL DATA:  Possible sepsis, shortness of breath and abdominal pain, initial encounter EXAM: CT CHEST, ABDOMEN AND PELVIS WITHOUT CONTRAST TECHNIQUE: Multidetector CT imaging of the chest, abdomen and pelvis was performed following the standard protocol without IV contrast. COMPARISON:  09/13/2021 FINDINGS: CT CHEST FINDINGS Cardiovascular: Heart is not significantly enlarged in size. Heavy coronary calcifications are noted. Mild atherosclerotic calcifications of the thoracic aorta are noted. Changes of prior coronary bypass grafting are seen. Left jugular dialysis catheter is noted in satisfactory position. Mediastinum/Nodes: Thoracic inlet is within normal limits. The esophagus is within normal limits. No sizable hilar or mediastinal adenopathy is noted. Lungs/Pleura: Large bilateral pleural effusions are noted right slightly greater than left. These of increased in the interval from the prior exam. Lower lobe consolidation is noted stable from the prior study. No sizable parenchymal nodule is seen. Musculoskeletal: Degenerative changes of the thoracic spine are noted. No acute rib abnormality is seen. No compression deformity is noted. Postsurgical changes in the sternum are seen. CT ABDOMEN PELVIS FINDINGS Hepatobiliary: No focal liver abnormality is seen. Gallbladder is decompressed but stable. Pancreas: Unremarkable. No pancreatic ductal dilatation or surrounding  inflammatory changes. Spleen: Normal in size without focal abnormality. Adrenals/Urinary Tract: Adrenal glands are within normal limits. Kidneys are well visualized bilaterally. No renal calculi or obstructive changes are noted. The bladder is decompressed. Stomach/Bowel: Diverticular changes noted without evidence of diverticulitis. Previously seen postsurgical changes in the sigmoid are less well appreciated on today's exam due to contrast material throughout the sigmoid. More proximal colon is decompressed. No obstructive or inflammatory changes are seen. The appendix is within normal limits. Vascular/Lymphatic: Aortic atherosclerosis. No enlarged abdominal or pelvic lymph nodes. Reproductive: Prostate is unremarkable. Other: Mild free fluid is noted stable in appearance from the prior exam. Considerable changes of anasarca are noted within the abdominal wall slightly increased when compared with the prior exam. Musculoskeletal: Degenerative changes of lumbar spine are seen. IMPRESSION: CT of the chest: Bilateral pleural effusions right considerably greater than left and increased from the prior examination from 2 days previous. Associated lower lobe consolidation is noted. CT of the abdomen and pelvis: Diverticulosis without diverticulitis. Free fluid within the abdomen stable from the prior exam. Increasing changes of anasarca in the abdominal wall. Aortic Atherosclerosis (ICD10-I70.0). Electronically Signed   By: Inez Catalina M.D.   On: 09/15/2021 20:33    ROS: As per HPI otherwise negative.   Physical Exam: Vitals:   09/16/21 0040 09/16/21 0330 09/16/21 0515 09/16/21 0800  BP: 91/80 95/67 91/76  93/76  Pulse: 87 82 95 84  Resp: 20 17 15 14   Temp:      TempSrc:      SpO2: 94% 97% 94% 100%  Weight:      Height:         General: Chronically ill appearing male in NAD Head: Normocephalic, atraumatic, sclera non-icteric, mucus membranes are moist Neck: Supple. JVD not elevated. Lungs: Clear  bilaterally to auscultation without wheezes, rales, or rhonchi. Breathing is unlabored. Heart: RRR with S1 S2. No murmurs, rubs, or gallops appreciated. Abdomen: Obese, NABs, tender periumbilical area M-S:  Strength and tone appear normal for age. Lower extremities: L BKA no stump edema. 1+ RLE edema, woody appearance to extremity.  Neuro: Alert and  oriented X 3. Moves all extremities spontaneously. Psych:  Responds to questions appropriately with a normal affect. Dialysis Access: [facility] intact. Refuses permanent access.   Dialysis Orders: Center: [facility] MWF 4.5 hrs 250NRe 400/800 EDW 111 kg 2.0 K/ 2.0 Ca UFP 2 TDC -Heparin 12000 Heparin 12000 units IV TIW -Hectorol 3 mcg IV TIW -Mircera 30 mcg IV q 4 weeks (Last dose11/25/2022)  Assessment/Plan:  Acute on chronic systolic HF: Serial HD for volume removal. Increase midodrine to facilitate volume removal, decrease hypotension. Primary considering thoracentesis. Per PCP.   ESRD -  Recently changed to MWF. Had HD yesterday but no volume removed. HD again today off schedule. 4.0 K bath. Next HD 09/18/2021.   Hypertension/volume  - CXR with bilateral pleural effusions, vascular congestion. See # 1. Increase midodrine dose.   Anemia  - HGB 9.6. ESA ordered for today.   Metabolic bone disease -  Corrected Ca 8.3. PO4 added to labs today. Continue VDRA, sensipar, binders.   Nutrition - Renal/Carb mod diet Albumin 2.6. Add protein supps.  DMT2 per primary  H/O CABG H/O High grade AVG S/P PPM.    Dondre Catalfamo H. Owens Shark, NP-C 09/16/2021, 9:38 AM  [place]

## 2021-09-16 NOTE — Procedures (Signed)
Pre procedural Dx: Symptomatic pleural effusion Post procedural Dx: Same  Successful US guided right sided thoracentesis yielding 1.2 L of serous pleural fluid.    EBL: None Complications: None immediate.  Ronny Bacon, MD Pager #: [phone]

## 2021-09-16 NOTE — ED Notes (Signed)
Pt requesting nasal cannula, states unable to breath

## 2021-09-16 NOTE — Evaluation (Signed)
Occupational Therapy Evaluation Patient Details Name: Jonathon Bailey MRN: [id number] DOB: [birth year] Today's Date: 09/16/2021   History of Present Illness Pt is a 65 y.o. male who presented 09/15/21 with SOB following his HD treatment session. Pt also with R leg edema. Pt with volume overload, acute on chronic systolic CHF with bilateral pleural effusions. 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 He demonstrates the below listed deficits and will benefit from continued OT to maximize safety and independence with BADLs.  Pt presents to OT with generalized weakness, impaired balance, decreased activity tolerance, decreased safety awareness.  He currently requires set up - min A for UB ADLs, and max - total A for LB ADLs.  He is unable to safely perform transfers.  He moved sit to partial stand with max A+ 2.  Pt had been at [facility] for a day after last hospitalization.  Recommend return to SNF for rehab.        Recommendations for follow up therapy are one component of a multi-disciplinary discharge planning process, led by the attending physician.  Recommendations may be updated based on patient status, additional functional criteria and insurance authorization.   Follow Up Recommendations  Long-term institutional care without follow-up therapy    Assistance Recommended at Discharge Frequent or constant Supervision/Assistance  Functional Status Assessment  Patient has had a recent decline in their functional status and demonstrates the ability to make significant improvements in function in a reasonable and predictable amount of time.  Equipment Recommendations  None recommended by OT    Recommendations for Other Services       Precautions / Restrictions Precautions Precautions:  Fall;Other (comment) Precaution Comments: prior L BKA Restrictions Weight Bearing Restrictions: No      Mobility Bed Mobility Overal bed mobility: Needs Assistance Bed Mobility: Supine to Sit;Sit to Supine     Supine to sit: Mod assist;+2 for safety/equipment;HOB elevated Sit to supine: Mod assist;+2 for safety/equipment;HOB elevated   General bed mobility comments: Pt requiring modA to ascend trunk and pivot L hip towards R edge of stretcher to sit up. ModA to manage legs and trunk back to midline supine on stretcher.    Transfers Overall transfer level: Needs assistance Equipment used: Rolling walker (2 wheels) Transfers: Sit to/from Stand Sit to Stand: Max assist;+2 physical assistance;+2 safety/equipment;From elevated surface           General transfer comment: Pt pushing up on RW to come to stand from edge of stretcher, needing maxAx2 to power up to get knee extended but pt unable to extend hips to stand upright despite x2 attempts. Pt able to push up from bed and initiate buttocks clearance slightly, but unable to scoot laterally without maxA.      Balance Overall balance assessment: Needs assistance Sitting-balance support: Single extremity supported;Bilateral upper extremity supported;Feet supported Sitting balance-Leahy Scale: Poor Sitting balance - Comments: Reliant on at least 1 UE support to sit statically EOB.   Standing balance support: Bilateral upper extremity supported Standing balance-Leahy Scale: Zero Standing balance comment: MaxAx2 and bil UE support to stand 2x, but unable to extend hips to stand upright either attempt.  ADL either performed or assessed with clinical judgement   ADL Overall ADL's : Needs assistance/impaired Eating/Feeding: Independent   Grooming: Wash/dry hands;Wash/dry face;Oral care;Brushing hair;Set up;Sitting   Upper Body Bathing: Set up;Supervision/ safety;Sitting   Lower Body Bathing:  Maximal assistance;Bed level   Upper Body Dressing : Minimal assistance;Sitting   Lower Body Dressing: Total assistance;Bed level   Toilet Transfer: Total assistance Toilet Transfer Details (indicate cue type and reason): unable to safely perform Toileting- Clothing Manipulation and Hygiene: Total assistance;Bed level       Functional mobility during ADLs: Total assistance (unable to safely perform transfers)       Vision Patient Visual Report: No change from baseline       Perception     Praxis      Pertinent Vitals/Pain Pain Assessment: Faces Faces Pain Scale: Hurts little more Pain Location: buttocks, posterior thighs at wounds Pain Descriptors / Indicators: Discomfort;Grimacing;Moaning Pain Intervention(s): Monitored during session;Limited activity within patient's tolerance;Repositioned     Hand Dominance Right   Extremity/Trunk Assessment Upper Extremity Assessment Upper Extremity Assessment: Generalized weakness   Lower Extremity Assessment Lower Extremity Assessment: Defer to PT evaluation   Cervical / Trunk Assessment Cervical / Trunk Assessment: Other exceptions Cervical / Trunk Exceptions: increased body habitus   Communication Communication Communication: No difficulties   Cognition Arousal/Alertness: Awake/alert Behavior During Therapy: WFL for tasks assessed/performed Overall Cognitive Status: No family/caregiver present to determine baseline cognitive functioning                                 General Comments: Pt insisting on raising stretcher to make transfers easier this date, even though educated it would be unsafe to do so considering multiple factors, thereby poor safety awareness. However, this is likely his baseline.     General Comments  VSS on RA    Exercises     Shoulder Instructions      Home Living Family/patient expects to be discharged to:: Skilled nursing facility                                  Additional Comments: Pt recently hospitalized in [facility] and went to a SNF for rehab following that hospitalization. Had only been at the SNF a day prior to this admission.      Prior Functioning/Environment Prior Level of Function : Needs assist             Mobility Comments: Prior to hospitalization in [facility], he reports being mod I for squat pivot transfers, but reports often sliding his posterior thighs or scrotum across the metal of the w/c or commode and causing wounds. Reports he has not stood in several months. ADLs Comments: Pt was mod I prior to hospitalization in [facility] for ADLs, but had recently needed assistance for all ADLs.        OT Problem List: Decreased strength;Decreased activity tolerance;Impaired balance (sitting and/or standing);Decreased safety awareness;Obesity      OT Treatment/Interventions: Self-care/ADL training;Therapeutic exercise;Energy conservation;DME and/or AE instruction;Therapeutic activities;Patient/family education    OT Goals(Current goals can be found in the care plan section) Acute Rehab OT Goals Patient Stated Goal: To stand up OT Goal Formulation: With patient Time For Goal Achievement: October 04, 2021 Potential to Achieve Goals: Good ADL Goals Pt Will Perform Lower Body Bathing: with mod assist;sit to/from stand;with adaptive equipment Pt Will Perform Lower  Body Dressing: with mod assist;sit to/from stand;with adaptive equipment Pt Will Transfer to Toilet: with mod assist;squat pivot transfer;stand pivot transfer;bedside commode Pt Will Perform Toileting - Clothing Manipulation and hygiene: with mod assist;sitting/lateral leans;sit to/from stand  OT Frequency: Min 2X/week   Barriers to D/C: Decreased caregiver support  family unable to provide current level of assist       Co-evaluation PT/OT/SLP Co-Evaluation/Treatment: Yes Reason for Co-Treatment: For patient/therapist safety;To address functional/ADL transfers PT goals  addressed during session: Mobility/safety with mobility;Balance;Proper use of DME OT goals addressed during session: ADL's and self-care;Strengthening/ROM      AM-PAC OT "6 Clicks" Daily Activity     Outcome Measure Help from another person eating meals?: None Help from another person taking care of personal grooming?: A Little Help from another person toileting, which includes using toliet, bedpan, or urinal?: Total Help from another person bathing (including washing, rinsing, drying)?: A Lot Help from another person to put on and taking off regular upper body clothing?: A Little Help from another person to put on and taking off regular lower body clothing?: Total 6 Click Score: 14   End of Session Equipment Utilized During Treatment: Gait belt;Rolling walker (2 wheels) Nurse Communication: Mobility status  Activity Tolerance: Patient tolerated treatment well Patient left: in bed;with call bell/phone within reach  OT Visit Diagnosis: Unsteadiness on feet (R26.81)                Time: 4975-3005 OT Time Calculation (min): 19 min Charges:  OT General Charges $OT Visit: 1 Visit OT Evaluation $OT Eval Moderate Complexity: 1 Mod  Sakai Wolford C., OTR/L Acute Rehabilitation Services Pager [phone] Office [phone]   Lucille Passy M 09/16/2021, 12:46 PM

## 2021-09-16 NOTE — ED Notes (Signed)
Pt taken to US at this time

## 2021-09-16 NOTE — Progress Notes (Signed)
PROGRESS NOTE  Jonathon Bailey FAO:[id number] DOB: Sep 12, 1956   PCP: [facility]  Patient is from: Home.  Lives with family.  Uses wheelchair at baseline.  DOA: 09/15/2021 LOS: 0  Chief complaints:  Chief Complaint  Patient presents with   Shortness of Breath     Brief Narrative / Interim history: 65 year old M with PMH of ESRD on HD MWF, CAD s/p remote CABG and stents, systolic CHF, AVB s/p leadless PPM in 05/2021, DM-2, CVA, left BKA, ACD, HTN, HLD and recent hospitalization at [facility] from 12/12-12/22  for volume overload requiring serial HD and rectal bleed felt to be due to internal hemorrhoids after colonoscopy brought to ED from dialysis center with shortness of breath and admitted for acute on chronic systolic CHF and volume overload.  He had full session dialysis but no volume removed.  He was hypotensive to 82/59.  Not tachycardic.  CXR consistent with CHF.  CT chest/abdomen/pelvis without contrast with large bilateral pleural effusion, free fluid within abdomen and anasarca.  Cultures obtained.  Patient was started on IV Zosyn.  Nephrology consulted.     Subjective: Seen and examined earlier this morning.  Continues to endorse dyspnea.  He also reports orthopnea and abdominal pain.  He denies chest pain.  Admits to intermittent dry cough.  Objective: Vitals:   09/16/21 0330 09/16/21 0515 09/16/21 0800 09/16/21 1100  BP: 95/67 91/76 93/76  95/69  Pulse: 82 95 84 87  Resp: 17 15 14 19   Temp:      TempSrc:      SpO2: 97% 94% 100% 98%  Weight:      Height:        Examination:  GENERAL: No apparent distress.  Nontoxic. HEENT: MMM.  Vision and hearing grossly intact.  NECK: Supple.  Notable JVD. RESP: 98% on RA.  No IWOB.  Fair aeration bilaterally. CVS:  RRR. Heart sounds normal.  ABD/GI/GU: BS+. Abd soft.  Mild diffuse tenderness to palpation.  MSK/EXT:  Moves extremities.  Left BKA. SKIN: no apparent skin lesion or wound NEURO:  Awake, alert and oriented appropriately.  No apparent focal neuro deficit. PSYCH: Calm. Normal affect.   Procedures:  None  Microbiology summarized: ONGEX-52 and influenza PCR nonreactive. Blood cultures NGTD.  Assessment & Plan: Acute on chronic combined CHF/volume overload/ESRD on HD MWF-hospitalized for the same at [facility] from 12/12-12/22 requiring serial HD for volume removal.  TTE in 04/2020 with LVEF of 50 to 55% (improved), indeterminate DD but previously G2-DD, mild to moderate AS and RVSP of 36 mmHg.  Imaging with vascular congestion, cardiomegaly, bilateral large pleural effusion, abdominal free fluid and anasarca. -Agree with serial dialysis for volume removal per nephrology -Agree with increasing midodrine for blood pressure support -IR consulted for thoracocentesis for large bilateral pleural effusion -Renal diet    Hypotension: Improved. -Agree with increasing midodrine   Lactic acidosis: Likely from hypoperfusion in the setting of #1 and #2.  Low suspicion for infectious process.  Blood cultures NGTD. -Recheck once volume improve  IDDM-2 with hyperglycemia: A1c 7.9% in 04/2020. Recent Labs  Lab 09/16/21 1230  GLUCAP 53*  -Check hemoglobin A1c -Start D10 at 30 cc an hour  History of CAD s/p remote CABG and stents-stable.  No anginal symptoms -Echocardiogram as above  High-grade AVB s/p leadless PPM in 05/2021 -VP rhythm on admission.   Thrombocytopenia: Stable without obvious bleeding.  Continue to monitor.   Anemia of chronic kidney disease: H&H stable. Recent Labs  10/13/20 1125 12/03/20 1048 09/15/21 1826 09/16/21 0427  HGB 11.9* 12.1* 9.3* 9.6*  -ESA and IV iron per nephrology  Ambulatory dysfunction/left BKA-uses wheelchair at baseline. -PT/OT eval  Class I obesity Body mass index is 34.17 kg/m.         DVT prophylaxis:  SCDs Start: 09/15/21 2344 given thrombocytopenia.  Code Status: Full code Family Communication:  Patient and/or RN. Available if any question.  Level of care: Telemetry Cardiac Status is: Observation  The patient will require care spanning > 2 midnights and should be moved to inpatient because: Due to acute on chronic combined CHF/volume overload requiring serial hemodialysis for volume removal, and hypotension      Consultants:  Nephrology Interventional radiology   Sch Meds:  Scheduled Meds:  aspirin  325 mg Oral Daily   atorvastatin  80 mg Oral QPC supper   Chlorhexidine Gluconate Cloth  6 each Topical Q0600   darbepoetin (ARANESP) injection - DIALYSIS  40 mcg Intravenous Once   doxercalciferol  3 mcg Intravenous Q T,Th,Sa-HD   heparin  10,000 Units Dialysis Once in dialysis   lidocaine-EPINEPHrine       midodrine  10 mg Oral TID WC   sodium chloride flush  3 mL Intravenous Q12H   Continuous Infusions:  sodium chloride     sodium chloride     PRN Meds:.sodium chloride, sodium chloride, acetaminophen **OR** acetaminophen, albuterol, alteplase, dextrose, heparin, hydrOXYzine, lidocaine (PF), lidocaine-prilocaine, ondansetron **OR** ondansetron (ZOFRAN) IV, pentafluoroprop-tetrafluoroeth, senna-docusate  Antimicrobials: Anti-infectives (From admission, onward)    Start     Dose/Rate Route Frequency Ordered Stop   09/15/21 1930  piperacillin-tazobactam (ZOSYN) IVPB 3.375 g        3.375 g 100 mL/hr over 30 Minutes Intravenous  Once 09/15/21 1921 09/15/21 2345        I have personally reviewed the following labs and images: CBC: Recent Labs  Lab 09/15/21 1826 09/16/21 0427  WBC 4.7 4.9  NEUTROABS 2.5  --   HGB 9.3* 9.6*  HCT 30.6* 30.4*  MCV 83.6 83.5  PLT 71* 73*   BMP &GFR Recent Labs  Lab 09/15/21 1826 09/16/21 0427  NA 136 134*  K 3.8 3.9  CL 96* 96*  CO2 27 25  GLUCOSE 65* 71  BUN 10 13  CREATININE 4.44* 5.01*  CALCIUM 7.3* 7.2*  MG  --  2.1   Estimated Creatinine Clearance: 18.9 mL/min (A) (by C-G formula based on SCr of 5.01 mg/dL  (H)). Liver & Pancreas: Recent Labs  Lab 09/15/21 1826 09/16/21 0427  AST 49* 54*  ALT 30 33  ALKPHOS 147* 142*  BILITOT 2.1* 2.0*  PROT 7.5 7.4  ALBUMIN 2.7* 2.6*   Recent Labs  Lab 09/15/21 1826  LIPASE 31   No results for input(s): AMMONIA in the last 168 hours. Diabetic: No results for input(s): HGBA1C in the last 72 hours. No results for input(s): GLUCAP in the last 168 hours. Cardiac Enzymes: No results for input(s): CKTOTAL, CKMB, CKMBINDEX, TROPONINI in the last 168 hours. No results for input(s): PROBNP in the last 8760 hours. Coagulation Profile: No results for input(s): INR, PROTIME in the last 168 hours. Thyroid Function Tests: No results for input(s): TSH, T4TOTAL, FREET4, T3FREE, THYROIDAB in the last 72 hours. Lipid Profile: No results for input(s): CHOL, HDL, LDLCALC, TRIG, CHOLHDL, LDLDIRECT in the last 72 hours. Anemia Panel: No results for input(s): VITAMINB12, FOLATE, FERRITIN, TIBC, IRON, RETICCTPCT in the last 72 hours. Urine analysis:    Component Value Date/Time  COLORURINE YELLOW 09/02/2014 0159   APPEARANCEUR CLEAR 09/02/2014 0159   LABSPEC 1.019 09/02/2014 0159   PHURINE 5.5 09/02/2014 0159   GLUCOSEU 500 (A) 09/02/2014 0159   HGBUR MODERATE (A) 09/02/2014 0159   BILIRUBINUR NEGATIVE 09/02/2014 0159   KETONESUR NEGATIVE 09/02/2014 0159   PROTEINUR >300 (A) 09/02/2014 0159   UROBILINOGEN 0.2 09/02/2014 0159   NITRITE NEGATIVE 09/02/2014 0159   LEUKOCYTESUR NEGATIVE 09/02/2014 0159   Sepsis Labs: Invalid input(s): PROCALCITONIN, [place]  Microbiology: Recent Results (from the past 240 hour(s))  Culture, blood (routine x 2)     Status: None (Preliminary result)   Collection Time: 09/15/21  6:28 PM   Specimen: Site Not Specified; Blood  Result Value Ref Range Status   Specimen Description SITE NOT SPECIFIED  Final   Special Requests   Final    BOTTLES DRAWN AEROBIC AND ANAEROBIC Blood Culture results may not be optimal due to an  excessive volume of blood received in culture bottles   Culture   Final    NO GROWTH < 24 HOURS Performed at [facility] [address] [place], [place] [address]    Report Status PENDING  Incomplete  Culture, blood (routine x 2)     Status: None (Preliminary result)   Collection Time: 09/15/21  6:29 PM   Specimen: Site Not Specified; Blood  Result Value Ref Range Status   Specimen Description SITE NOT SPECIFIED  Final   Special Requests   Final    BOTTLES DRAWN AEROBIC AND ANAEROBIC Blood Culture results may not be optimal due to an excessive volume of blood received in culture bottles   Culture   Final    NO GROWTH < 24 HOURS Performed at [facility] [address], [place], [place] [address]    Report Status PENDING  Incomplete  Resp Panel by RT-PCR (Flu A&B, Covid) Nasopharyngeal Swab     Status: None   Collection Time: 09/16/21  2:55 AM   Specimen: Nasopharyngeal Swab; Nasopharyngeal(NP) swabs in vial transport medium  Result Value Ref Range Status   SARS Coronavirus 2 by RT PCR NEGATIVE NEGATIVE Final    Comment: (NOTE) SARS-CoV-2 target nucleic acids are NOT DETECTED.  The SARS-CoV-2 RNA is generally detectable in upper respiratory specimens during the acute phase of infection. The lowest concentration of SARS-CoV-2 viral copies this assay can detect is 138 copies/mL. A negative result does not preclude SARS-Cov-2 infection and should not be used as the sole basis for treatment or other patient management decisions. A negative result may occur with  improper specimen collection/handling, submission of specimen other than nasopharyngeal swab, presence of viral mutation(s) within the areas targeted by this assay, and inadequate number of viral copies(<138 copies/mL). A negative result must be combined with clinical observations, patient history, and epidemiological information. The expected result is Negative.  Fact Sheet for Patients:   EntrepreneurPulse.com.au  Fact Sheet for Healthcare Providers:  IncredibleEmployment.be  This test is no t yet approved or cleared by the [place] FDA and  has been authorized for detection and/or diagnosis of SARS-CoV-2 by FDA under an Emergency Use Authorization (EUA). This EUA will remain  in effect (meaning this test can be used) for the duration of the COVID-19 declaration under Section 564(b)(1) of the Act, 21 U.S.C.section 360bbb-3(b)(1), unless the authorization is terminated  or revoked sooner.       Influenza A by PCR NEGATIVE NEGATIVE Final   Influenza B by PCR NEGATIVE NEGATIVE Final  Comment: (NOTE) The Xpert Xpress SARS-CoV-2/FLU/RSV plus assay is intended as an aid in the diagnosis of influenza from Nasopharyngeal swab specimens and should not be used as a sole basis for treatment. Nasal washings and aspirates are unacceptable for Xpert Xpress SARS-CoV-2/FLU/RSV testing.  Fact Sheet for Patients: EntrepreneurPulse.com.au  Fact Sheet for Healthcare Providers: IncredibleEmployment.be  This test is not yet approved or cleared by the [place] FDA and has been authorized for detection and/or diagnosis of SARS-CoV-2 by FDA under an Emergency Use Authorization (EUA). This EUA will remain in effect (meaning this test can be used) for the duration of the COVID-19 declaration under Section 564(b)(1) of the Act, 21 U.S.C. section 360bbb-3(b)(1), unless the authorization is terminated or revoked.  Performed at [facility], [place] [address] [place],  [address]     Radiology Studies: DG Chest Port 1 View  Result Date: 09/15/2021 CLINICAL DATA:  Shortness of breath EXAM: PORTABLE CHEST 1 VIEW COMPARISON:  Chest radiograph dated December 03, 2020 FINDINGS: The heart is markedly enlarged. Pulmonary vascular congestion. Moderate bilateral pleural effusions. Dialysis catheter with  distal tip in the right atrium. IMPRESSION: Cardiomegaly with pulmonary vascular congestion and moderate bilateral pleural effusions, suggesting volume overload/pulmonary edema. Left access dialysis catheter with distal tip in the right atrium. Electronically Signed   By: Keane Police D.O.   On: 09/15/2021 18:00   CT CHEST ABDOMEN PELVIS WO CONTRAST  Result Date: 09/15/2021 CLINICAL DATA:  Possible sepsis, shortness of breath and abdominal pain, initial encounter EXAM: CT CHEST, ABDOMEN AND PELVIS WITHOUT CONTRAST TECHNIQUE: Multidetector CT imaging of the chest, abdomen and pelvis was performed following the standard protocol without IV contrast. COMPARISON:  09/13/2021 FINDINGS: CT CHEST FINDINGS Cardiovascular: Heart is not significantly enlarged in size. Heavy coronary calcifications are noted. Mild atherosclerotic calcifications of the thoracic aorta are noted. Changes of prior coronary bypass grafting are seen. Left jugular dialysis catheter is noted in satisfactory position. Mediastinum/Nodes: Thoracic inlet is within normal limits. The esophagus is within normal limits. No sizable hilar or mediastinal adenopathy is noted. Lungs/Pleura: Large bilateral pleural effusions are noted right slightly greater than left. These of increased in the interval from the prior exam. Lower lobe consolidation is noted stable from the prior study. No sizable parenchymal nodule is seen. Musculoskeletal: Degenerative changes of the thoracic spine are noted. No acute rib abnormality is seen. No compression deformity is noted. Postsurgical changes in the sternum are seen. CT ABDOMEN PELVIS FINDINGS Hepatobiliary: No focal liver abnormality is seen. Gallbladder is decompressed but stable. Pancreas: Unremarkable. No pancreatic ductal dilatation or surrounding inflammatory changes. Spleen: Normal in size without focal abnormality. Adrenals/Urinary Tract: Adrenal glands are within normal limits. Kidneys are well visualized  bilaterally. No renal calculi or obstructive changes are noted. The bladder is decompressed. Stomach/Bowel: Diverticular changes noted without evidence of diverticulitis. Previously seen postsurgical changes in the sigmoid are less well appreciated on today's exam due to contrast material throughout the sigmoid. More proximal colon is decompressed. No obstructive or inflammatory changes are seen. The appendix is within normal limits. Vascular/Lymphatic: Aortic atherosclerosis. No enlarged abdominal or pelvic lymph nodes. Reproductive: Prostate is unremarkable. Other: Mild free fluid is noted stable in appearance from the prior exam. Considerable changes of anasarca are noted within the abdominal wall slightly increased when compared with the prior exam. Musculoskeletal: Degenerative changes of lumbar spine are seen. IMPRESSION: CT of the chest: Bilateral pleural effusions right considerably greater than left and increased from the prior examination from 2  days previous. Associated lower lobe consolidation is noted. CT of the abdomen and pelvis: Diverticulosis without diverticulitis. Free fluid within the abdomen stable from the prior exam. Increasing changes of anasarca in the abdominal wall. Aortic Atherosclerosis (ICD10-I70.0). Electronically Signed   By: Inez Catalina M.D.   On: 09/15/2021 20:33       Devonna Oboyle T. [place]  If 7PM-7AM, please contact night-coverage [email or web address] 09/16/2021, 12:19 PM

## 2021-09-16 NOTE — ED Notes (Signed)
Breakfast tray ordered 

## 2021-09-16 NOTE — Evaluation (Signed)
Physical Therapy Evaluation Patient Details Name: Jonathon Bailey MRN: [id number] DOB: 1956-01-12 Today's Date: 09/16/2021  History of Present Illness  Pt is a 65 y.o. male who presented 09/15/21 with SOB following his HD treatment session. Pt also with R leg edema. Pt with volume overload, acute on chronic systolic CHF with bilateral pleural effusions. 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 Most recently, pt had just been transferred to a SNF for short-term rehab after being hospitalized in [facility] Prior to that, he was mod I for squat pivot transfers in his home. Currently, pt displays deficits in lower extremity strength, balance, safety awareness, and activity tolerance. He is at high risk for falls and injuries, requiring modA for bed mobility and maxAx2 to partially stand at edge of stretcher today. Pt would benefit from returning to his SNF for short-term rehab to maximize his safety and independence with all functional mobility. Will continue to follow acutely.       Recommendations for follow up therapy are one component of a multi-disciplinary discharge planning process, led by the attending physician.  Recommendations may be updated based on patient status, additional functional criteria and insurance authorization.  Follow Up Recommendations Skilled nursing-short term rehab (<3 hours/day)    Assistance Recommended at Discharge Frequent or constant Supervision/Assistance  Functional Status Assessment Patient has had a recent decline in their functional status and demonstrates the ability to make significant improvements in function in a reasonable and predictable amount of time.  Equipment Recommendations   Other (comment) (defer to next venue of care)    Recommendations for Other Services       Precautions / Restrictions Precautions Precautions: Fall;Other (comment) Precaution Comments: prior L BKA Restrictions Weight Bearing Restrictions: No      Mobility  Bed Mobility Overal bed mobility: Needs Assistance Bed Mobility: Supine to Sit;Sit to Supine     Supine to sit: Mod assist;+2 for safety/equipment;HOB elevated Sit to supine: Mod assist;+2 for safety/equipment;HOB elevated   General bed mobility comments: Pt requiring modA to ascend trunk and pivot L hip towards R edge of stretcher to sit up. ModA to manage legs and trunk back to midline supine on stretcher.    Transfers Overall transfer level: Needs assistance Equipment used: Rolling walker (2 wheels) Transfers: Sit to/from Stand Sit to Stand: Max assist;+2 physical assistance;+2 safety/equipment;From elevated surface           General transfer comment: Pt pushing up on RW to come to stand from edge of stretcher, needing maxAx2 to power up to get knee extended but pt unable to extend hips to stand upright despite x2 attempts. Pt able to push up from bed and initiate buttocks clearance slightly, but unable to scoot laterally without maxA.    Ambulation/Gait               General Gait Details: Unable  Stairs            Wheelchair Mobility    Modified Rankin (Stroke Patients Only)       Balance Overall balance assessment: Needs assistance Sitting-balance support: Single extremity supported;Bilateral upper extremity supported;Feet supported Sitting balance-Leahy Scale: Poor Sitting balance - Comments: Reliant on at least 1 UE support to sit statically EOB.  Standing balance support: Bilateral upper extremity supported Standing balance-Leahy Scale: Zero Standing balance comment: MaxAx2 and bil UE support to stand 2x, but unable to extend hips to stand upright either attempt.                              Pertinent Vitals/Pain Pain Assessment: Faces Faces Pain Scale: Hurts little more Pain Location: buttocks, posterior thighs at wounds Pain Descriptors / Indicators: Discomfort;Grimacing;Moaning Pain Intervention(s): Limited activity within patient's tolerance;Monitored during session;Repositioned    Home Living Family/patient expects to be discharged to:: Skilled nursing facility                   Additional Comments: Pt recently hospitalized in [facility] and went to a SNF for rehab following that hospitalization. Had only been at the SNF a couple days prior to this admission.    Prior Function Prior Level of Function : Needs assist             Mobility Comments: Prior to hospitalization in [facility], he reports being mod I for squat pivot transfers, but reports often sliding his posterior thighs or scrotum across the metal of the w/c or commode and causing wounds. Reports he has not stood in several months. ADLs Comments: Pt was mod I prior to hospitalization in [facility] for ADLs, but had recently needed assistance for all ADLs.     Hand Dominance        Extremity/Trunk Assessment   Upper Extremity Assessment Upper Extremity Assessment: Defer to OT evaluation    Lower Extremity Assessment Lower Extremity Assessment: Generalized weakness (MMT score of 5 at L quad but functionally weak; denies numbness/tingling in bil legs)    Cervical / Trunk Assessment Cervical / Trunk Assessment: Other exceptions Cervical / Trunk Exceptions: increased body habitus  Communication   Communication: No difficulties  Cognition Arousal/Alertness: Awake/alert Behavior During Therapy: WFL for tasks assessed/performed Overall Cognitive Status: No family/caregiver present to determine baseline cognitive functioning                                 General Comments: Pt insisting on raising stretcher to make transfers easier this date, even though  educated it would be unsafe to do so considering multiple factors, thereby poor safety awareness. However, this is likely his baseline.        General Comments General comments (skin integrity, edema, etc.): VSS on RA    Exercises     Assessment/Plan    PT Assessment Patient needs continued PT services  PT Problem List Decreased strength;Decreased activity tolerance;Decreased balance;Decreased mobility;Decreased safety awareness;Cardiopulmonary status limiting activity;Decreased skin integrity       PT Treatment Interventions DME instruction;Gait training;Functional mobility training;Therapeutic activities;Balance training;Therapeutic exercise;Neuromuscular re-education;Patient/family education;Wheelchair mobility training    PT Goals (Current goals can be found in the Care Plan section)  Acute Rehab PT Goals Patient Stated Goal: to stand PT Goal Formulation: With patient Time For Goal Achievement: 09/30/21 Potential to Achieve Goals: Good    Frequency Min 2X/week   Barriers to discharge        Co-evaluation PT/OT/SLP Co-Evaluation/Treatment: Yes Reason for Co-Treatment: For patient/therapist safety;To address functional/ADL transfers PT goals addressed during session: Mobility/safety with mobility;Balance;Proper use of DME         AM-PAC PT "6 Clicks" Mobility  Outcome Measure Help needed turning from your back to your side  while in a flat bed without using bedrails?: A Little Help needed moving from lying on your back to sitting on the side of a flat bed without using bedrails?: A Lot Help needed moving to and from a bed to a chair (including a wheelchair)?: Total Help needed standing up from a chair using your arms (e.g., wheelchair or bedside chair)?: Total Help needed to walk in hospital room?: Total Help needed climbing 3-5 steps with a railing? : Total 6 Click Score: 9    End of Session Equipment Utilized During Treatment: Gait belt Activity Tolerance:  Patient tolerated treatment well Patient left: in bed;with call bell/phone within reach Nurse Communication: Mobility status;Other (comment) (redness at sacrum noted) PT Visit Diagnosis: Unsteadiness on feet (R26.81);Muscle weakness (generalized) (M62.81);Difficulty in walking, not elsewhere classified (R26.2)    Time: 9432-0037 PT Time Calculation (min) (ACUTE ONLY): 19 min   Charges:   PT Evaluation $PT Eval Moderate Complexity: 1 Mod          Moishe Spice, PT, DPT Acute Rehabilitation Services  Pager: [phone] Office: [phone]   Orvan Falconer 09/16/2021, 12:23 PM

## 2021-09-17 ENCOUNTER — Inpatient Hospital Stay (HOSPITAL_COMMUNITY): Payer: No Typology Code available for payment source

## 2021-09-17 DIAGNOSIS — R0609 Other forms of dyspnea: Secondary | ICD-10-CM

## 2021-09-17 LAB — CBC
HCT: 27.5 % — ABNORMAL LOW (ref 39.0–52.0)
Hemoglobin: 8.9 g/dL — ABNORMAL LOW (ref 13.0–17.0)
MCH: 25.9 pg — ABNORMAL LOW (ref 26.0–34.0)
MCHC: 32.4 g/dL (ref 30.0–36.0)
MCV: 79.9 fL — ABNORMAL LOW (ref 80.0–100.0)
Platelets: 75 10*3/uL — ABNORMAL LOW (ref 150–400)
RBC: 3.44 MIL/uL — ABNORMAL LOW (ref 4.22–5.81)
RDW: 21.3 % — ABNORMAL HIGH (ref 11.5–15.5)
WBC: 5.3 10*3/uL (ref 4.0–10.5)
nRBC: 0 % (ref 0.0–0.2)

## 2021-09-17 LAB — HEMOGLOBIN A1C
Hgb A1c MFr Bld: 5.5 % (ref 4.8–5.6)
Mean Plasma Glucose: 111.15 mg/dL

## 2021-09-17 LAB — ECHOCARDIOGRAM COMPLETE
AR max vel: 0.86 cm2
AV Area VTI: 0.79 cm2
AV Area mean vel: 0.79 cm2
AV Mean grad: 5 mmHg
AV Peak grad: 9.6 mmHg
Ao pk vel: 1.55 m/s
Height: 71 in
S' Lateral: 4.8 cm
Single Plane A4C EF: 10.2 %
Weight: 3954.17 oz

## 2021-09-17 LAB — RENAL FUNCTION PANEL
Albumin: 2.2 g/dL — ABNORMAL LOW (ref 3.5–5.0)
Anion gap: 12 (ref 5–15)
BUN: 17 mg/dL (ref 8–23)
CO2: 25 mmol/L (ref 22–32)
Calcium: 7 mg/dL — ABNORMAL LOW (ref 8.9–10.3)
Chloride: 97 mmol/L — ABNORMAL LOW (ref 98–111)
Creatinine, Ser: 5.83 mg/dL — ABNORMAL HIGH (ref 0.61–1.24)
GFR, Estimated: 10 mL/min — ABNORMAL LOW (ref >60)
Glucose, Bld: 70 mg/dL (ref 70–99)
Phosphorus: 3.4 mg/dL (ref 2.5–4.6)
Potassium: 4.2 mmol/L (ref 3.5–5.1)
Sodium: 134 mmol/L — ABNORMAL LOW (ref 135–145)

## 2021-09-17 LAB — MAGNESIUM: Magnesium: 2.1 mg/dL (ref 1.7–2.4)

## 2021-09-17 LAB — LACTIC ACID, PLASMA: Lactic Acid, Venous: 4.3 mmol/L (ref 0.5–1.9)

## 2021-09-17 NOTE — Progress Notes (Signed)
Pt returned to the unit. No report given prior to patient's return.

## 2021-09-17 NOTE — Progress Notes (Signed)
[facility] Progress Note    Assessment/ Plan:    Acute on chronic systolic HF: Serial HD for volume removal. Increase midodrine to facilitate volume removal, decrease hypotension. S/p thora 12/24 with 1.2L removed. UF as tolerated  ESRD -  Recently changed to MWF. Had HD yesterday but no volume removed. HD again today off schedule. 4.0 K bath. Next HD 09/18/2021.   Hypertension/volume  - CXR with bilateral pleural effusions, vascular congestion. See # 1. Increase midodrine dose.   Anemia  - HGB down to 8.9. ESA ordered for today.   Metabolic bone disease -  Corrected Ca 8.3. PO4 wnl. Continue VDRA, sensipar, binders.   Nutrition - Renal/Carb mod diet Albumin 2.6. Add protein supps.  DMT2 per primary  H/O CABG H/O High grade AVG S/P PPM.  Dialysis access: [facility]. Refuses avf/avg  Subjective:   S/p thora yesterday, 1.2L removed. HD pushed to this AM due to staffing issues (transport is here to take him to HD). Feels better in regards to breathing after thora.   Objective:   BP (!) 86/67 (BP Location: Right Arm)    Pulse 86    Temp 97.9 F (36.6 C) (Oral)    Resp 18    Ht 5\' 11"  (1.803 m)    Wt 108.8 kg    SpO2 98%    BMI 33.45 kg/m   Intake/Output Summary (Last 24 hours) at 09/17/2021 6195 Last data filed at 09/17/2021 0600 Gross per 24 hour  Intake 514.12 ml  Output --  Net 514.12 ml   Weight change: -136.2 kg  Physical Exam: Gen:nad CVS:s1s2, rrr Resp:decreased air entry bibasilar, no w/r/r/c, unlabored, bl chest expansion KDT:OIZTI, soft Ext:1+ rle edema Neuro: awake, alert Dialysis access: [facility]  Imaging: DG Chest 1 View  Result Date: 09/16/2021 CLINICAL DATA:  Post thoracentesis. EXAM: CHEST  1 VIEW COMPARISON:  09/15/2021 (CT and chest x-ray) FINDINGS: Stable cardiac silhouette.  Large 4 central venous line. Interval decrease in RIGHT pleural effusion. Moderate RIGHT pleural effusion remains. No pneumothorax. LEFT effusion also noted unchanged.  IMPRESSION: Mild decrease in RIGHT pleural effusion.  No pneumothorax. Electronically Signed   By: Suzy Bouchard M.D.   On: 09/16/2021 12:43   DG Chest Port 1 View  Result Date: 09/15/2021 CLINICAL DATA:  Shortness of breath EXAM: PORTABLE CHEST 1 VIEW COMPARISON:  Chest radiograph dated December 03, 2020 FINDINGS: The heart is markedly enlarged. Pulmonary vascular congestion. Moderate bilateral pleural effusions. Dialysis catheter with distal tip in the right atrium. IMPRESSION: Cardiomegaly with pulmonary vascular congestion and moderate bilateral pleural effusions, suggesting volume overload/pulmonary edema. Left access dialysis catheter with distal tip in the right atrium. Electronically Signed   By: Keane Police D.O.   On: 09/15/2021 18:00   CT CHEST ABDOMEN PELVIS WO CONTRAST  Result Date: 09/15/2021 CLINICAL DATA:  Possible sepsis, shortness of breath and abdominal pain, initial encounter EXAM: CT CHEST, ABDOMEN AND PELVIS WITHOUT CONTRAST TECHNIQUE: Multidetector CT imaging of the chest, abdomen and pelvis was performed following the standard protocol without IV contrast. COMPARISON:  09/13/2021 FINDINGS: CT CHEST FINDINGS Cardiovascular: Heart is not significantly enlarged in size. Heavy coronary calcifications are noted. Mild atherosclerotic calcifications of the thoracic aorta are noted. Changes of prior coronary bypass grafting are seen. Left jugular dialysis catheter is noted in satisfactory position. Mediastinum/Nodes: Thoracic inlet is within normal limits. The esophagus is within normal limits. No sizable hilar or mediastinal adenopathy is noted. Lungs/Pleura: Large bilateral pleural effusions are noted right slightly  greater than left. These of increased in the interval from the prior exam. Lower lobe consolidation is noted stable from the prior study. No sizable parenchymal nodule is seen. Musculoskeletal: Degenerative changes of the thoracic spine are noted. No acute rib abnormality is  seen. No compression deformity is noted. Postsurgical changes in the sternum are seen. CT ABDOMEN PELVIS FINDINGS Hepatobiliary: No focal liver abnormality is seen. Gallbladder is decompressed but stable. Pancreas: Unremarkable. No pancreatic ductal dilatation or surrounding inflammatory changes. Spleen: Normal in size without focal abnormality. Adrenals/Urinary Tract: Adrenal glands are within normal limits. Kidneys are well visualized bilaterally. No renal calculi or obstructive changes are noted. The bladder is decompressed. Stomach/Bowel: Diverticular changes noted without evidence of diverticulitis. Previously seen postsurgical changes in the sigmoid are less well appreciated on today's exam due to contrast material throughout the sigmoid. More proximal colon is decompressed. No obstructive or inflammatory changes are seen. The appendix is within normal limits. Vascular/Lymphatic: Aortic atherosclerosis. No enlarged abdominal or pelvic lymph nodes. Reproductive: Prostate is unremarkable. Other: Mild free fluid is noted stable in appearance from the prior exam. Considerable changes of anasarca are noted within the abdominal wall slightly increased when compared with the prior exam. Musculoskeletal: Degenerative changes of lumbar spine are seen. IMPRESSION: CT of the chest: Bilateral pleural effusions right considerably greater than left and increased from the prior examination from 2 days previous. Associated lower lobe consolidation is noted. CT of the abdomen and pelvis: Diverticulosis without diverticulitis. Free fluid within the abdomen stable from the prior exam. Increasing changes of anasarca in the abdominal wall. Aortic Atherosclerosis (ICD10-I70.0). Electronically Signed   By: Inez Catalina M.D.   On: 09/15/2021 20:33   US THORACENTESIS ASP PLEURAL SPACE W/IMG GUIDE  Result Date: 09/16/2021 INDICATION: Symptomatic right-sided sided pleural effusion. Please perform ultrasound-guided thoracentesis for  therapeutic purposes. EXAM: US THORACENTESIS ASP PLEURAL SPACE W/IMG GUIDE COMPARISON:  Chest radiograph-09/15/2021; chest CT-09/15/2021 MEDICATIONS: None. COMPLICATIONS: None immediate. TECHNIQUE: Informed written consent was obtained from the patient after a discussion of the risks, benefits and alternatives to treatment. A timeout was performed prior to the initiation of the procedure. Initial ultrasound scanning demonstrates a large anechoic right-sided pleural effusion. Sonographic evaluation of the left chest demonstrated a trace left-sided pleural effusion. As such, decision was made to proceed with right-sided thoracentesis. The inferolateral aspect of the right chest was prepped and draped in the usual sterile fashion. 1% lidocaine was used for local anesthesia. An ultrasound image was saved for documentation purposes. An 8 Fr Safe-T-Centesis catheter was introduced. The thoracentesis was performed. The catheter was removed and a dressing was applied. The patient tolerated the procedure well without immediate post procedural complication. The patient was escorted to have an upright chest radiograph. FINDINGS: A total of approximately 1.2 liters of serous fluid was removed. IMPRESSION: Successful ultrasound-guided right sided thoracentesis yielding 1.2 liters of pleural fluid. Electronically Signed   By: Sandi Mariscal M.D.   On: 09/16/2021 13:42    Labs: BMET Recent Labs  Lab 09/15/21 1826 09/16/21 0427 09/16/21 1507 09/17/21 0442  NA 136 134* 135 134*  K 3.8 3.9 4.2 4.2  CL 96* 96* 97* 97*  CO2 27 25 25 25   GLUCOSE 65* 71 107* 70  BUN 10 13 16 17   CREATININE 4.44* 5.01* 5.44* 5.83*  CALCIUM 7.3* 7.2* 7.3* 7.0*  PHOS  --   --  3.4 3.4   CBC Recent Labs  Lab 09/15/21 1826 09/16/21 0427 09/17/21 0442  WBC 4.7 4.9 5.3  NEUTROABS 2.5  --   --   HGB 9.3* 9.6* 8.9*  HCT 30.6* 30.4* 27.5*  MCV 83.6 83.5 79.9*  PLT 71* 73* 75*    Medications:     aspirin EC  81 mg Oral Daily    atorvastatin  80 mg Oral QPC supper   Chlorhexidine Gluconate Cloth  6 each Topical Q0600   darbepoetin (ARANESP) injection - DIALYSIS  40 mcg Intravenous Once   doxercalciferol  3 mcg Intravenous Q T,Th,Sa-HD   heparin  10,000 Units Dialysis Once in dialysis   midodrine  10 mg Oral TID WC   sodium chloride flush  3 mL Intravenous Q12H      Gean Quint, MD [facility] 09/17/2021, 7:42 AM

## 2021-09-17 NOTE — Progress Notes (Signed)
PROGRESS NOTE    Jonathon Bailey  ZGY:[id number] DOB: August 03, 1956 DOA: 09/15/2021 PCP: [facility]   Chief Complain: Shortness of breath  Brief Narrative: Patient is a 65 year old male with history of ESRD on dialysis Monday Wednesday Friday schedule, coronary artery disease status post CABG, stents, systolic CHF, AV block status post pacemaker placement, diabetes type 2, CVA, left BKA, hypertension, hyperlipidemia who was brought to the emergency department from dialysis center with complaints of shortness of breath.  On presentation he was hyportensive.  Chest x-ray showed features of CHF.  CT imaging showed large bilateral pleural effusions, free fluid in the abdomen.  Nephrology consulted for dialysis.  Underwent thoracentesis  Assessment & Plan:   Principal Problem:   Volume overload Active Problems:   Diabetes mellitus type 2 with complications (HCC)   Coronary atherosclerosis   Anemia in chronic kidney disease, on chronic dialysis (HCC)   ESRD on dialysis ([place])   Acute on chronic systolic CHF (congestive heart failure) (HCC)   Hypotension   Lactic acidosis   Thrombocytopenia (HCC)   Acute on chronic combined systolic and diastolic CHF (congestive heart failure) (HCC)   Volume overload/acute on chronic combined systolic/diastolic congestive heart failure: Recently hospitalized for the same at [facility] dialysis here, nephrology following.  Plan for serial dialysis.Next HD is tomorrow Last echo showed EF of 50 to 55%, indeterminate diastolic dysfunction but previous echo had shown grade 2 diastolic dysfunction. Chest  imaging was suggestive of vascular congestion, bilateral pleural effusions, abdominal free fluid, anasarca. New echo is pending  Bilateral pleural effusion: Underwent right-sided thoracentesis with removal of 1.2 L of fluid on 12/24  Hypertension: Blood pressure still soft.  Continue midodrine  Lactic acidosis: Most likely from  hypoperfusion in the setting of third spacing/volume overload.  Continue monitor.  No indication for antibiotic treatment.  Blood cultures no growth to date.  Insulin-dependent type 2 diabetes: Recent A1c of 7.9%.  Continue monitoring blood sugars.  Currently hypoglycemic, started on D10  History of coronary artery disease: Status post CABG, stents.  No anginal symptoms.  AV block: Status post pacemaker placement on 9/22  Thrombocytopenia: Stable, chronic.  Without any signs of bleeding  Normocytic anemia: Patient with ESRD.  Continue to monitor.  On ESA and IV iron per nephrology  Debility/deconditioning/left BKA: Wheelchair-bound  Obesity: BMI of 33.4         DVT prophylaxis:SCD Code Status: Full Family Communication: None at bedside Patient status:Inpatient  Dispo: The patient is from: Home              Anticipated d/c is to: Home              Anticipated d/c date is: In 1-2 days  Consultants: Nephrology  Procedures: Hemodialysis, thoracentesis  Antimicrobials:  Anti-infectives (From admission, onward)    Start     Dose/Rate Route Frequency Ordered Stop   09/15/21 1930  piperacillin-tazobactam (ZOSYN) IVPB 3.375 g        3.375 g 100 mL/hr over 30 Minutes Intravenous  Once 09/15/21 1921 09/15/21 2345       Subjective: Patient seen and examined at the bedside this morning.  Hemodynamically stable.  On dialysis.  Bp still soft.  Denies new complaints.  Looked comfortable Objective: Vitals:   09/16/21 1510 09/16/21 2138 09/17/21 0042 09/17/21 0528  BP:  (!) 88/73 90/69 (!) 86/67  Pulse:  83 81 86  Resp:  16 18 18   Temp:  (!) 97.4 F (36.3 C) (!)  97.4 F (36.3 C) 97.9 F (36.6 C)  TempSrc:  Oral Oral Oral  SpO2:  98% 97% 98%  Weight: 108.8 kg     Height: 5\' 11"  (1.803 m)       Intake/Output Summary (Last 24 hours) at 09/17/2021 0748 Last data filed at 09/17/2021 0600 Gross per 24 hour  Intake 514.12 ml  Output --  Net 514.12 ml   Filed Weights    09/15/21 1654 09/15/21 1655 09/16/21 1510  Weight: (!) 245 kg 111.1 kg 108.8 kg    Examination:  General exam: Overall comfortable, not in distress, deconditioned, weak HEENT: PERRL Respiratory system:  no wheezes or crackles  Cardiovascular system: S1 & S2 heard, RRR.  Dialysis catheter on the chest Gastrointestinal system: Abdomen is nondistended, soft and nontender. Central nervous system: Alert and oriented Extremities: No edema, no clubbing ,no cyanosis, left BKA Skin: No rashes, no ulcers,no icterus      Data Reviewed: I have personally reviewed following labs and imaging studies  CBC: Recent Labs  Lab 09/15/21 1826 09/16/21 0427 09/17/21 0442  WBC 4.7 4.9 5.3  NEUTROABS 2.5  --   --   HGB 9.3* 9.6* 8.9*  HCT 30.6* 30.4* 27.5*  MCV 83.6 83.5 79.9*  PLT 71* 73* 75*   Basic Metabolic Panel: Recent Labs  Lab 09/15/21 1826 09/16/21 0427 09/16/21 1507 09/17/21 0442  NA 136 134* 135 134*  K 3.8 3.9 4.2 4.2  CL 96* 96* 97* 97*  CO2 27 25 25 25   GLUCOSE 65* 71 107* 70  BUN 10 13 16 17   CREATININE 4.44* 5.01* 5.44* 5.83*  CALCIUM 7.3* 7.2* 7.3* 7.0*  MG  --  2.1  --  2.1  PHOS  --   --  3.4 3.4   GFR: Estimated Creatinine Clearance: 16.1 mL/min (A) (by C-G formula based on SCr of 5.83 mg/dL (H)). Liver Function Tests: Recent Labs  Lab 09/15/21 1826 09/16/21 0427 09/16/21 1507 09/17/21 0442  AST 49* 54*  --   --   ALT 30 33  --   --   ALKPHOS 147* 142*  --   --   BILITOT 2.1* 2.0*  --   --   PROT 7.5 7.4  --   --   ALBUMIN 2.7* 2.6* 2.4* 2.2*   Recent Labs  Lab 09/15/21 1826  LIPASE 31   No results for input(s): AMMONIA in the last 168 hours. Coagulation Profile: No results for input(s): INR, PROTIME in the last 168 hours. Cardiac Enzymes: No results for input(s): CKTOTAL, CKMB, CKMBINDEX, TROPONINI in the last 168 hours. BNP (last 3 results) No results for input(s): PROBNP in the last 8760 hours. HbA1C: Recent Labs    09/17/21 0442   HGBA1C 5.5   CBG: Recent Labs  Lab 09/16/21 1230 09/16/21 1300 09/16/21 1355  GLUCAP 53* 63* 99   Lipid Profile: No results for input(s): CHOL, HDL, LDLCALC, TRIG, CHOLHDL, LDLDIRECT in the last 72 hours. Thyroid Function Tests: No results for input(s): TSH, T4TOTAL, FREET4, T3FREE, THYROIDAB in the last 72 hours. Anemia Panel: No results for input(s): VITAMINB12, FOLATE, FERRITIN, TIBC, IRON, RETICCTPCT in the last 72 hours. Sepsis Labs: Recent Labs  Lab 09/15/21 1827 09/16/21 0427 09/17/21 0442  LATICACIDVEN 4.0* 5.2* 4.3*    Recent Results (from the past 240 hour(s))  Culture, blood (routine x 2)     Status: None (Preliminary result)   Collection Time: 09/15/21  6:28 PM   Specimen: Site Not Specified; Blood  Result Value  Ref Range Status   Specimen Description SITE NOT SPECIFIED  Final   Special Requests   Final    BOTTLES DRAWN AEROBIC AND ANAEROBIC Blood Culture results may not be optimal due to an excessive volume of blood received in culture bottles   Culture   Final    NO GROWTH < 24 HOURS Performed at [facility] [address] [place], [place] [address]    Report Status PENDING  Incomplete  Culture, blood (routine x 2)     Status: None (Preliminary result)   Collection Time: 09/15/21  6:29 PM   Specimen: Site Not Specified; Blood  Result Value Ref Range Status   Specimen Description SITE NOT SPECIFIED  Final   Special Requests   Final    BOTTLES DRAWN AEROBIC AND ANAEROBIC Blood Culture results may not be optimal due to an excessive volume of blood received in culture bottles   Culture   Final    NO GROWTH < 24 HOURS Performed at [facility] [address] [place],  [address]    Report Status PENDING  Incomplete  Resp Panel by RT-PCR (Flu A&B, Covid) Nasopharyngeal Swab     Status: None   Collection Time: 09/16/21  2:55 AM   Specimen: Nasopharyngeal Swab; Nasopharyngeal(NP) swabs in vial transport medium  Result Value Ref Range  Status   SARS Coronavirus 2 by RT PCR NEGATIVE NEGATIVE Final    Comment: (NOTE) SARS-CoV-2 target nucleic acids are NOT DETECTED.  The SARS-CoV-2 RNA is generally detectable in upper respiratory specimens during the acute phase of infection. The lowest concentration of SARS-CoV-2 viral copies this assay can detect is 138 copies/mL. A negative result does not preclude SARS-Cov-2 infection and should not be used as the sole basis for treatment or other patient management decisions. A negative result may occur with  improper specimen collection/handling, submission of specimen other than nasopharyngeal swab, presence of viral mutation(s) within the areas targeted by this assay, and inadequate number of viral copies(<138 copies/mL). A negative result must be combined with clinical observations, patient history, and epidemiological information. The expected result is Negative.  Fact Sheet for Patients:  EntrepreneurPulse.com.au  Fact Sheet for Healthcare Providers:  IncredibleEmployment.be  This test is no t yet approved or cleared by the [place] FDA and  has been authorized for detection and/or diagnosis of SARS-CoV-2 by FDA under an Emergency Use Authorization (EUA). This EUA will remain  in effect (meaning this test can be used) for the duration of the COVID-19 declaration under Section 564(b)(1) of the Act, 21 U.S.C.section 360bbb-3(b)(1), unless the authorization is terminated  or revoked sooner.       Influenza A by PCR NEGATIVE NEGATIVE Final   Influenza B by PCR NEGATIVE NEGATIVE Final    Comment: (NOTE) The Xpert Xpress SARS-CoV-2/FLU/RSV plus assay is intended as an aid in the diagnosis of influenza from Nasopharyngeal swab specimens and should not be used as a sole basis for treatment. Nasal washings and aspirates are unacceptable for Xpert Xpress SARS-CoV-2/FLU/RSV testing.  Fact Sheet for  Patients: EntrepreneurPulse.com.au  Fact Sheet for Healthcare Providers: IncredibleEmployment.be  This test is not yet approved or cleared by the [place] FDA and has been authorized for detection and/or diagnosis of SARS-CoV-2 by FDA under an Emergency Use Authorization (EUA). This EUA will remain in effect (meaning this test can be used) for the duration of the COVID-19 declaration under Section 564(b)(1) of the Act, 21 U.S.C. section 360bbb-3(b)(1), unless the authorization  is terminated or revoked.  Performed at [facility], [place] [address] [place], [place] [address]          Radiology Studies: DG Chest 1 View  Result Date: 09/16/2021 CLINICAL DATA:  Post thoracentesis. EXAM: CHEST  1 VIEW COMPARISON:  09/15/2021 (CT and chest x-ray) FINDINGS: Stable cardiac silhouette.  Large 4 central venous line. Interval decrease in RIGHT pleural effusion. Moderate RIGHT pleural effusion remains. No pneumothorax. LEFT effusion also noted unchanged. IMPRESSION: Mild decrease in RIGHT pleural effusion.  No pneumothorax. Electronically Signed   By: Suzy Bouchard M.D.   On: 09/16/2021 12:43   DG Chest Port 1 View  Result Date: 09/15/2021 CLINICAL DATA:  Shortness of breath EXAM: PORTABLE CHEST 1 VIEW COMPARISON:  Chest radiograph dated December 03, 2020 FINDINGS: The heart is markedly enlarged. Pulmonary vascular congestion. Moderate bilateral pleural effusions. Dialysis catheter with distal tip in the right atrium. IMPRESSION: Cardiomegaly with pulmonary vascular congestion and moderate bilateral pleural effusions, suggesting volume overload/pulmonary edema. Left access dialysis catheter with distal tip in the right atrium. Electronically Signed   By: Keane Police D.O.   On: 09/15/2021 18:00   CT CHEST ABDOMEN PELVIS WO CONTRAST  Result Date: 09/15/2021 CLINICAL DATA:  Possible sepsis, shortness of breath and abdominal pain, initial encounter  EXAM: CT CHEST, ABDOMEN AND PELVIS WITHOUT CONTRAST TECHNIQUE: Multidetector CT imaging of the chest, abdomen and pelvis was performed following the standard protocol without IV contrast. COMPARISON:  09/13/2021 FINDINGS: CT CHEST FINDINGS Cardiovascular: Heart is not significantly enlarged in size. Heavy coronary calcifications are noted. Mild atherosclerotic calcifications of the thoracic aorta are noted. Changes of prior coronary bypass grafting are seen. Left jugular dialysis catheter is noted in satisfactory position. Mediastinum/Nodes: Thoracic inlet is within normal limits. The esophagus is within normal limits. No sizable hilar or mediastinal adenopathy is noted. Lungs/Pleura: Large bilateral pleural effusions are noted right slightly greater than left. These of increased in the interval from the prior exam. Lower lobe consolidation is noted stable from the prior study. No sizable parenchymal nodule is seen. Musculoskeletal: Degenerative changes of the thoracic spine are noted. No acute rib abnormality is seen. No compression deformity is noted. Postsurgical changes in the sternum are seen. CT ABDOMEN PELVIS FINDINGS Hepatobiliary: No focal liver abnormality is seen. Gallbladder is decompressed but stable. Pancreas: Unremarkable. No pancreatic ductal dilatation or surrounding inflammatory changes. Spleen: Normal in size without focal abnormality. Adrenals/Urinary Tract: Adrenal glands are within normal limits. Kidneys are well visualized bilaterally. No renal calculi or obstructive changes are noted. The bladder is decompressed. Stomach/Bowel: Diverticular changes noted without evidence of diverticulitis. Previously seen postsurgical changes in the sigmoid are less well appreciated on today's exam due to contrast material throughout the sigmoid. More proximal colon is decompressed. No obstructive or inflammatory changes are seen. The appendix is within normal limits. Vascular/Lymphatic: Aortic  atherosclerosis. No enlarged abdominal or pelvic lymph nodes. Reproductive: Prostate is unremarkable. Other: Mild free fluid is noted stable in appearance from the prior exam. Considerable changes of anasarca are noted within the abdominal wall slightly increased when compared with the prior exam. Musculoskeletal: Degenerative changes of lumbar spine are seen. IMPRESSION: CT of the chest: Bilateral pleural effusions right considerably greater than left and increased from the prior examination from 2 days previous. Associated lower lobe consolidation is noted. CT of the abdomen and pelvis: Diverticulosis without diverticulitis. Free fluid within the abdomen stable from the prior exam. Increasing changes of anasarca in the abdominal wall. Aortic Atherosclerosis (  ICD10-I70.0). Electronically Signed   By: Inez Catalina M.D.   On: 09/15/2021 20:33   US THORACENTESIS ASP PLEURAL SPACE W/IMG GUIDE  Result Date: 09/16/2021 INDICATION: Symptomatic right-sided sided pleural effusion. Please perform ultrasound-guided thoracentesis for therapeutic purposes. EXAM: US THORACENTESIS ASP PLEURAL SPACE W/IMG GUIDE COMPARISON:  Chest radiograph-09/15/2021; chest CT-09/15/2021 MEDICATIONS: None. COMPLICATIONS: None immediate. TECHNIQUE: Informed written consent was obtained from the patient after a discussion of the risks, benefits and alternatives to treatment. A timeout was performed prior to the initiation of the procedure. Initial ultrasound scanning demonstrates a large anechoic right-sided pleural effusion. Sonographic evaluation of the left chest demonstrated a trace left-sided pleural effusion. As such, decision was made to proceed with right-sided thoracentesis. The inferolateral aspect of the right chest was prepped and draped in the usual sterile fashion. 1% lidocaine was used for local anesthesia. An ultrasound image was saved for documentation purposes. An 8 Fr Safe-T-Centesis catheter was introduced. The  thoracentesis was performed. The catheter was removed and a dressing was applied. The patient tolerated the procedure well without immediate post procedural complication. The patient was escorted to have an upright chest radiograph. FINDINGS: A total of approximately 1.2 liters of serous fluid was removed. IMPRESSION: Successful ultrasound-guided right sided thoracentesis yielding 1.2 liters of pleural fluid. Electronically Signed   By: Sandi Mariscal M.D.   On: 09/16/2021 13:42        Scheduled Meds:  aspirin EC  81 mg Oral Daily   atorvastatin  80 mg Oral QPC supper   Chlorhexidine Gluconate Cloth  6 each Topical Q0600   darbepoetin (ARANESP) injection - DIALYSIS  40 mcg Intravenous Once   doxercalciferol  3 mcg Intravenous Q T,Th,Sa-HD   heparin  10,000 Units Dialysis Once in dialysis   midodrine  10 mg Oral TID WC   sodium chloride flush  3 mL Intravenous Q12H   Continuous Infusions:  sodium chloride     sodium chloride     dextrose 30 mL/hr at 09/16/21 1250     LOS: 1 day    Time spent: 35 mins.More than 50% of that time was spent in counseling and/or coordination of care.      Shelly Coss, MD [facility] P12/25/2022, 7:48 AM

## 2021-09-17 NOTE — Progress Notes (Signed)
Pt taken to dialysis via bed by transport.

## 2021-09-17 NOTE — Progress Notes (Signed)
°  Echocardiogram 2D Echocardiogram has been performed.  Johny Chess 09/17/2021, 5:15 PM

## 2021-09-17 NOTE — Progress Notes (Signed)
Report received from hemodialysis.

## 2021-09-18 LAB — RENAL FUNCTION PANEL
Albumin: 2.3 g/dL — ABNORMAL LOW (ref 3.5–5.0)
Anion gap: 13 (ref 5–15)
BUN: 15 mg/dL (ref 8–23)
CO2: 26 mmol/L (ref 22–32)
Calcium: 6.9 mg/dL — ABNORMAL LOW (ref 8.9–10.3)
Chloride: 96 mmol/L — ABNORMAL LOW (ref 98–111)
Creatinine, Ser: 5.14 mg/dL — ABNORMAL HIGH (ref 0.61–1.24)
GFR, Estimated: 12 mL/min — ABNORMAL LOW (ref >60)
Glucose, Bld: 74 mg/dL (ref 70–99)
Phosphorus: 2.9 mg/dL (ref 2.5–4.6)
Potassium: 4.3 mmol/L (ref 3.5–5.1)
Sodium: 135 mmol/L (ref 135–145)

## 2021-09-18 LAB — CBC WITH DIFFERENTIAL/PLATELET
Abs Immature Granulocytes: 0 10*3/uL (ref 0.00–0.07)
Basophils Absolute: 0.1 10*3/uL (ref 0.0–0.1)
Basophils Relative: 1 %
Eosinophils Absolute: 0.2 10*3/uL (ref 0.0–0.5)
Eosinophils Relative: 3 %
HCT: 29.6 % — ABNORMAL LOW (ref 39.0–52.0)
Hemoglobin: 9.6 g/dL — ABNORMAL LOW (ref 13.0–17.0)
Lymphocytes Relative: 37 %
Lymphs Abs: 2.1 10*3/uL (ref 0.7–4.0)
MCH: 25.9 pg — ABNORMAL LOW (ref 26.0–34.0)
MCHC: 32.4 g/dL (ref 30.0–36.0)
MCV: 80 fL (ref 80.0–100.0)
Monocytes Absolute: 0.4 10*3/uL (ref 0.1–1.0)
Monocytes Relative: 8 %
Neutro Abs: 2.9 10*3/uL (ref 1.7–7.7)
Neutrophils Relative %: 51 %
Platelets: 77 10*3/uL — ABNORMAL LOW (ref 150–400)
RBC: 3.7 MIL/uL — ABNORMAL LOW (ref 4.22–5.81)
RDW: 21.3 % — ABNORMAL HIGH (ref 11.5–15.5)
Smear Review: DECREASED
WBC: 5.6 10*3/uL (ref 4.0–10.5)
nRBC: 0.7 % — ABNORMAL HIGH (ref 0.0–0.2)

## 2021-09-18 LAB — LACTIC ACID, PLASMA: Lactic Acid, Venous: 3.9 mmol/L (ref 0.5–1.9)

## 2021-09-18 LAB — GLUCOSE, CAPILLARY: Glucose-Capillary: 65 mg/dL — ABNORMAL LOW (ref 70–99)

## 2021-09-18 MED ORDER — SODIUM CHLORIDE 0.9 % IV SOLN: INTRAVENOUS | Status: DC

## 2021-09-18 MED ORDER — HEPARIN SODIUM (PORCINE) 1000 UNIT/ML DIALYSIS
12000.0000 [IU] | Freq: Once | INTRAMUSCULAR | Status: AC
  Administered 2021-09-18: 6000 [IU] via INTRAVENOUS_CENTRAL
  Filled 2021-09-18 (×2): qty 12

## 2021-09-18 MED ORDER — ASPIRIN EC 81 MG PO TBEC
81.0000 mg | DELAYED_RELEASE_TABLET | Freq: Every day | ORAL | Status: DC
  Administered 2021-09-20 – 2021-09-22 (×3): 81 mg via ORAL
  Filled 2021-09-18 (×2): qty 1

## 2021-09-18 MED ORDER — SODIUM CHLORIDE 0.9 % IV SOLN: 100.0000 mL | INTRAVENOUS | Status: DC | PRN

## 2021-09-18 MED ORDER — SODIUM CHLORIDE 0.9% FLUSH: 3.0000 mL | INTRAVENOUS | Status: DC | PRN

## 2021-09-18 MED ORDER — SODIUM CHLORIDE 0.9 % IV SOLN: 250.0000 mL | INTRAVENOUS | Status: DC | PRN

## 2021-09-18 MED ORDER — HEPARIN SODIUM (PORCINE) 1000 UNIT/ML DIALYSIS: 1000.0000 [IU] | INTRAMUSCULAR | Status: DC | PRN

## 2021-09-18 MED ORDER — ASPIRIN 81 MG PO CHEW
81.0000 mg | CHEWABLE_TABLET | ORAL | Status: AC
  Administered 2021-09-19: 06:00:00 81 mg via ORAL
  Filled 2021-09-18: qty 1

## 2021-09-18 MED ORDER — SODIUM CHLORIDE 0.9% FLUSH
3.0000 mL | Freq: Two times a day (BID) | INTRAVENOUS | Status: DC
  Administered 2021-09-19: 06:00:00 3 mL via INTRAVENOUS

## 2021-09-18 NOTE — Progress Notes (Signed)
Received HD report from [facility]. Pt has completed HD with 2 liters of fluid removed. VS are TPR 97.4 f, 82, 18 and BP is 65/60 and 98% on room air. Pt is alert and oriented x 4. Physician saw him and told him is EF is 15%. Pt reported to Nurse in HD that his buttocks is sore and hurts while resting on the bed in his room and is requesting a new bed/mattress.

## 2021-09-18 NOTE — H&P (View-Only) (Signed)
CARDIOLOGY CONSULT NOTE  Patient ID: BHARGAV BARBARO MRN: [id number] DOB/AGE: Apr 27, 1956 65 y.o.  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  He sees: Sunday Spillers for his primary cardiac care.  He has been experiencing worsening fatigue, shortness of breath, as well as generalized distention.  At the same time, he reports that he has actually been losing weight has not been eating as much.  He has had chronic pleuritic chest pain for >60-month, but no recent acute chest pain.  He denies having had any pain prior to hospital admission, on ambulation.  On a separate note, he has recently had bright red bleeding per rectus. He underwent colonoscopy and was found to have large internal hemorrhoids.   Past Medical History:  Diagnosis Date   Anxiety    Arthritis    Asthma    "as a child"   CAD (coronary artery disease)    Chronic systolic heart failure ([place]) 09/21/2008   ECHO Feb 2013 showed LVEF low normal at 50-55%, +hypokinetic anterolateral wall and inferolateral wall.     CKD (chronic kidney disease) stage 4, GFR 15-29 ml/min (HCC) 08/30/2009   Progressive renal failure since 2008, creatinine 1.2 in 2008 up to 3.5 in 2012 and 3.2-5.0 in 2013. All UA's 2011-13 showed >300 protein on dipstick. Work-up in May 2011 showed negative Urine IFE and SPEP, ultrasound showed 12-13 cm kidneys with increased echogenicity and UPC ratio was 1.5 gm proteinuria.  Hgb A1C's from 2011 to 2013 were all between 9-11.  Patient saw Dr. Donnetta Hutching (vasc surgery) for HD access in Aug 2013 >  vein mapping was done and Dr. Donnetta Hutching felt the left arm (pt is R handed) was suitable for L arm Cimino radiocephalic fistula. Patient said he wasn't ready to consider doing dialysis and declined the surgery.      Depression    Diabetic retinopathy ([place])    ESRD (end stage renal disease) on dialysis ([place])    Headache(784.0)    "maybe monthly" (03/15/2014)   Hyperlipidemia    Hypertension    Macular edema    Myocardial infarction [facility])    status post MI x2 and 3 stents placed in 2003   Obesity    Sleep apnea    does not use CPAP   Stroke ([place]) ~ 2007; ~1987   "weak on right side; messed w/right side of brain; cry all the time"   Type II diabetes mellitus ([place])    insulin dependent     Past Surgical History:  Procedure Laterality Date   ABDOMINAL AORTOGRAM W/LOWER EXTREMITY N/A 04/29/2020   Procedure: ABDOMINAL AORTOGRAM W/LOWER EXTREMITY;  Surgeon: Angelia Mould, MD;  Location: [place] CV LAB;  Service: Cardiovascular;  Laterality: N/A;   AMPUTATION Left 05/02/2020   Procedure: AMPUTATION BELOW KNEE;  Surgeon: Rosetta Posner, MD;  Location: [place];  Service: Vascular;  Laterality: Left;   AV FISTULA PLACEMENT  10/17/2012   Procedure: ARTERIOVENOUS (AV) FISTULA CREATION;  Surgeon: Mal Misty, MD;  Location: [place];  Service: Vascular;  Laterality: Left;   BONE EXOSTOSIS  EXCISION Right 06/04/2019   Procedure: EXOSTOSIS EXCISION;  Surgeon: Leanora Cover, MD;  Location: [place];  Service: Orthopedics;  Laterality: Right;   CAPD INSERTION N/A 03/15/2014   Procedure: LAPAROSCOPIC INSERTION CONTINUOUS AMBULATORY PERITONEAL DIALYSIS CATHETER, LAPARASCOPIC INCISIONAL HERNIA  REPAIR  WITH MESH, OMENTOPEXY AND LYSIS OF ADHESIONS;  Surgeon: [name], MD;  Location: [place];  Service: General;  Laterality: N/A;   CORONARY ANGIOPLASTY WITH STENT PLACEMENT  ~ 2002   "3"   CORONARY ANGIOPLASTY WITH STENT PLACEMENT  01/24/2004   successful PCI/stenting RCA  drug eluting  cypher stent   CORONARY ANGIOPLASTY WITH STENT PLACEMENT  01/28/2004   successful stentin of a large bifurcation marginal branch of the ramus intermediate vessel   FLEXIBLE SIGMOIDOSCOPY N/A 09/05/2014   Procedure: FLEXIBLE SIGMOIDOSCOPY;  Surgeon: Cleotis Nipper, MD;  Location: [place];  Service: Endoscopy;  Laterality: N/A;   HERNIA REPAIR     INCISION AND DRAINAGE ABSCESS N/A 09/06/2014   Procedure: REMOVAL OF PD CATH;  Surgeon: Coralie Keens, MD;  Location: [place];  Service: General;  Laterality: N/A;   INCISIONAL HERNIA REPAIR  03/15/2014   LAPAROSCOPIC LYSIS OF ADHESIONS  03/15/2014   NM MYOCAR PERF WALL MOTION  02/06/2012   normal perfusion scan   PERITONEAL CATHETER INSERTION  03/15/2014   REFRACTIVE SURGERY Bilateral    SHUNTOGRAM N/A 03/11/2013   Procedure: Fistulogram;  Surgeon: Conrad Craighead, MD;  Location: [facility] CATH LAB;  Service: Cardiovascular;  Laterality: N/A;   TENDON TRANSFER Right 06/04/2019   Procedure: RIGHT HAND EXTENSOR TENDON TRANSFER TO SMALL FINGER;  Surgeon: Leanora Cover, MD;  Location: [place];  Service: Orthopedics;  Laterality: Right;   UMBILICAL HERNIA REPAIR        Family History  Problem Relation Age of Onset   Asthma Mother    Hyperlipidemia Mother    Hypertension Mother    Stroke Father    Heart attack Father    Prostate cancer Father    Deep vein thrombosis Father    Cancer Father    Diabetes Father    Hyperlipidemia Father    Hypertension Father    Other Father        varicose veins   Heart disease Father        before age 74   Other Sister        varicose veins     Social History: Social History   Socioeconomic History   Marital status: Divorced    Spouse name: Not on file   Number of children: Not on file   Years of education: Not on file   Highest education level: Not on file  Occupational History   Not on file  Tobacco Use   Smoking status: Former    Packs/day: 1.00    Years: 15.00    Pack years:  15.00    Types: Cigarettes    Quit date: 09/24/1986    Years since quitting: 35.0   Smokeless tobacco: Never  Vaping Use   Vaping Use: Never used  Substance and Sexual Activity   Alcohol use: Not Currently    Comment: stopped in 2013   Drug use: No   Sexual activity: Not Currently  Other Topics Concern   Not on file  Social History Narrative   Not on file   Social Determinants of Health   Financial Resource Strain: Not on file  Food Insecurity: Not on file  Transportation Needs: Not on file  Physical Activity: Not on file  Stress: Not on file  Social Connections: Not on file  Intimate Partner Violence: Not on file     Medications Prior to Admission  Medication Sig Dispense Refill Last Dose   acetaminophen (TYLENOL) 500 MG tablet Take 1,000 mg by mouth every 6 (six) hours as needed for headache (pain).   unknown   albuterol (ACCUNEB) 0.63 MG/3ML nebulizer solution Take 1 ampule by nebulization every 6 (six) hours as needed for wheezing.   unknown   albuterol (PROVENTIL HFA;VENTOLIN HFA) 108 (90 BASE) MCG/ACT inhaler Inhale 2 puffs into the lungs every 6 (six) hours as needed (COPD).   unknown   aspirin 325 MG tablet Take 325 mg by mouth daily.   unknown   atorvastatin (LIPITOR) 80 MG tablet Take 80 mg by mouth daily after supper.   unknown   B Complex Vitamins (VITAMIN B COMPLEX) TABS Take 1 tablet by mouth every Monday, Wednesday, and Friday. Dialysis days   unknown   cinacalcet (SENSIPAR) 90 MG tablet Take 90 mg by mouth daily.   unknown   epoetin alfa (EPOGEN) 10000 UNIT/ML injection Inject 10,000 Units into the skin every 3 (three) days.   unknown   fluticasone (FLONASE) 50 MCG/ACT nasal spray Place 1 spray into both nostrils daily.   unknown   folic acid (FOLVITE) 948 MCG tablet Take 400 mcg by mouth every Monday, Wednesday, and Friday. Dialysis days   unknown   hydrOXYzine (ATARAX) 25 MG tablet Take 25 mg by mouth every 6 (six) hours as needed for itching (allergies).    unknown   insulin lispro (HUMALOG) 100 UNIT/ML injection Inject 2-12 Units into the skin See admin instructions. Inject 2-12 units subcutaneously three times daily before meals per sliding scale - CBG 201-250 2 units, 251-300 4 units, 301-350 6 units, 351-400 8 units, 401-450 10 units, >450 12 units and call MD   unknown   lactulose (CHRONULAC) 10 GM/15ML solution Take 20 g by mouth daily as needed (constipation).   unknown   lanthanum (FOSRENOL) 500 MG chewable tablet Chew 500 mg by mouth See admin instructions. Chew one tablet (500 mg) by mouth with the largest meal of the day   unknown   loperamide (IMODIUM) 2 MG capsule Take 2 mg by mouth daily as needed for diarrhea or loose stools.   unknown   metoCLOPramide (REGLAN) 5 MG tablet Take 5 mg by mouth 3 (three) times daily as needed for nausea.   unknown   midodrine (PROAMATINE) 5 MG tablet Take 5 mg by mouth 3 (three) times daily after meals. Hold for SBP >130   unknown   nitroGLYCERIN (NITROSTAT) 0.4 MG SL tablet Place 0.4 mg under the tongue every 5 (five) minutes as needed for chest pain.   unknown   pantoprazole (PROTONIX) 40 MG tablet Take 1 tablet (40 mg total) by mouth daily. (Patient taking differently: Take 40 mg by mouth daily at 6 (six) AM.) 30 tablet 0 09/15/2021 at am   polyethylene glycol (MIRALAX / GLYCOLAX) packet Take 17 g by mouth daily. (Patient taking differently: Take 17 g by mouth daily as needed (constipation). Mix with 6 oz fluid) 14 each 0 unknown   tiotropium (SPIRIVA) 18 MCG inhalation capsule Place 18 mcg into inhaler and inhale daily as needed (COPD).   unknown   vitamin C (ASCORBIC ACID) 500 MG tablet Take 500 mg by mouth daily.   unknown    Review of Systems  Constitutional: Positive for decreased appetite and  malaise/fatigue. Negative for weight gain and weight loss.  HENT:  Negative for congestion.   Eyes:  Negative for visual disturbance.  Cardiovascular:  Positive for dyspnea on exertion and leg swelling.  Negative for chest pain, palpitations and syncope.  Respiratory:  Negative for cough.   Endocrine: Negative for cold intolerance.  Hematologic/Lymphatic: Does not bruise/bleed easily.  Skin:  Negative for itching and rash.  Musculoskeletal:  Negative for myalgias.  Gastrointestinal:  Positive for bloating. Negative for abdominal pain, nausea and vomiting.  Genitourinary:  Negative for dysuria.  Neurological:  Negative for dizziness and weakness.  Psychiatric/Behavioral:  The patient is not nervous/anxious.   All other systems reviewed and are negative.    Physical Exam: Physical Exam Vitals and nursing note reviewed.  Constitutional:      General: He is not in acute distress.    Appearance: He is well-developed.  HENT:     Head: Normocephalic and atraumatic.  Eyes:     Conjunctiva/sclera: Conjunctivae normal.     Pupils: Pupils are equal, round, and reactive to light.  Neck:     Vascular: JVD present.  Cardiovascular:     Rate and Rhythm: Normal rate and regular rhythm.     Pulses: Normal pulses and intact distal pulses.     Heart sounds: No murmur heard. Pulmonary:     Effort: Pulmonary effort is normal.     Breath sounds: Decreased breath sounds present. No wheezing or rales.  Abdominal:     General: Bowel sounds are normal.     Palpations: Abdomen is soft.     Tenderness: There is no rebound.     Comments: Abdominal distention  Musculoskeletal:        General: No tenderness. Normal range of motion.     Right lower leg: Edema (2+) present.     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  Lymphadenopathy:     Cervical: No cervical adenopathy.  Skin:    General: Skin is warm and dry.  Neurological:     Mental Status: He is alert and oriented to person, place, and time.     Cranial Nerves: No cranial nerve deficit.     Labs:   Lab Results  Component Value Date   WBC 5.6 09/18/2021   HGB 9.6 (L) 09/18/2021   HCT 29.6 (L) 09/18/2021   MCV 80.0  09/18/2021   PLT 77 (L) 09/18/2021    Recent Labs  Lab 09/16/21 0427 09/16/21 1507 09/18/21 0740  NA 134*   < > 135  K 3.9   < > 4.3  CL 96*   < > 96*  CO2 25   < > 26  BUN 13   < > 15  CREATININE 5.01*   < > 5.14*  CALCIUM 7.2*   < > 6.9*  PROT 7.4  --   --   BILITOT 2.0*  --   --   ALKPHOS 142*  --   --   ALT 33  --   --   AST 54*  --   --   GLUCOSE 71   < > 74   < > = values in this interval not displayed.    Lipid Panel     Component Value Date/Time   CHOL 142 04/29/2020 0428   TRIG 177 (H) 04/29/2020 0428   HDL 28 (L) 04/29/2020 0428   CHOLHDL 5.1 04/29/2020 0428   VLDL 35 04/29/2020 0428   LDLCALC 79 04/29/2020 0428  BNP (last 3 results) No results for input(s): BNP in the last 8760 hours.  HEMOGLOBIN A1C Lab Results  Component Value Date   HGBA1C 5.5 09/17/2021   MPG 111.15 09/17/2021    Cardiac Panel (last 3 results) No results for input(s): CKTOTAL, CKMB, RELINDX in the last 8760 hours.  Invalid input(s): TROPONINHS  Lab Results  Component Value Date   PXTGGYI 948 (H) 11/13/2011   CKMB 8.1 (HH) 11/13/2011     TSH No results for input(s): TSH in the last 8760 hours.    Radiology: DG Chest 1 View  Result Date: 09/16/2021 CLINICAL DATA:  Post thoracentesis. EXAM: CHEST  1 VIEW COMPARISON:  09/15/2021 (CT and chest x-ray) FINDINGS: Stable cardiac silhouette.  Large 4 central venous line. Interval decrease in RIGHT pleural effusion. Moderate RIGHT pleural effusion remains. No pneumothorax. LEFT effusion also noted unchanged. IMPRESSION: Mild decrease in RIGHT pleural effusion.  No pneumothorax. Electronically Signed   By: Suzy Bouchard M.D.   On: 09/16/2021 12:43   ECHOCARDIOGRAM COMPLETE  Result Date: 09/17/2021    ECHOCARDIOGRAM REPORT   Patient Name:   ROSHAWN AYALA Date of Exam: 09/17/2021 Medical Rec #:  [id number]          Height:       71.0 in Accession #:    [id number]         Weight:       247.1 lb Date of Birth:  05-Jul-1956           BSA:          2.307 m Patient Age:    54 years           BP:           86/67 mmHg Patient Gender: M                  HR:           84 bpm. Exam Location:  Inpatient Procedure: 2D Echo Indications:    dyspnea  History:        Patient has prior history of Echocardiogram examinations, most                 recent 05/02/2020. End stage renal disease; Risk Factors:Diabetes,                 Dyslipidemia, Hypertension and Sleep Apnea.  Sonographer:    [place] Referring Phys: [id number] TAYE T GONFA IMPRESSIONS  1. Left ventricular ejection fraction, by estimation, is <20%. The left ventricle has severely decreased function. The left ventricle demonstrates global hypokinesis with significant septal dyskinesis. There is mild left ventricular hypertrophy. Left ventricular diastolic parameters are indeterminate. Elevated left ventricular end-diastolic pressure.  2. Right ventricular systolic function is moderately reduced. The right ventricular size is moderately enlarged. There is mildly elevated pulmonary artery systolic pressure. The estimated right ventricular systolic pressure is 93.8 mmHg.  3. Left atrial size was mildly dilated.  4. There is a trivial pericardial effusion posterior to the left ventricle. Left pleural effusion present.  5. The mitral valve is abnormal. Mild mitral valve regurgitation. Moderate mitral annular calcification.  6. Tricuspid valve regurgitation is severe.  7. The aortic valve is tricuspid. There is severe calcifcation of the aortic valve. Aortic valve regurgitation is not visualized. Possible severe low gradient aortic valve stenosis versus pseudo-stenosis in the setting of markedly reduced LVEF. Aortic valve mean gradient measures 5.0 mmHg. Dimentionless index 0.31.  8. The inferior vena cava  is dilated in size with <50% respiratory variability, suggesting right atrial pressure of 15 mmHg. Comparison(s): Prior images reviewed side by side. LVEF is now markedly reduced with  significant septal dyskinesis. Moderate RV dysfunction. Aortic stenosis possible severe range with low gradient although could be pseudo-stenosis in setting of significantly reduced LVEF. FINDINGS  Left Ventricle: Left ventricular ejection fraction, by estimation, is <20%. The left ventricle has severely decreased function. The left ventricle demonstrates global hypokinesis. The left ventricular internal cavity size was normal in size. There is mild left ventricular hypertrophy. Left ventricular diastolic parameters are indeterminate. Elevated left ventricular end-diastolic pressure. Right Ventricle: The right ventricular size is moderately enlarged. No increase in right ventricular wall thickness. Right ventricular systolic function is moderately reduced. There is mildly elevated pulmonary artery systolic pressure. The tricuspid regurgitant velocity is 2.32 m/s, and with an assumed right atrial pressure of 15 mmHg, the estimated right ventricular systolic pressure is 81.1 mmHg. Left Atrium: Left atrial size was mildly dilated. Right Atrium: Right atrial size was normal in size. Pericardium: Trivial pericardial effusion is present. The pericardial effusion is posterior to the left ventricle. Mitral Valve: The mitral valve is abnormal. There is mild thickening of the mitral valve leaflet(s). There is mild calcification of the mitral valve leaflet(s). Moderate mitral annular calcification. Mild mitral valve regurgitation. Tricuspid Valve: The tricuspid valve is grossly normal. Tricuspid valve regurgitation is severe. Aortic Valve: The aortic valve is tricuspid. There is severe calcifcation of the aortic valve. There is moderate aortic valve annular calcification. Aortic valve regurgitation is not visualized. Severe aortic stenosis is present. Aortic valve mean gradient measures 5.0 mmHg. Aortic valve peak gradient measures 9.6 mmHg. Aortic valve area, by VTI measures 0.79 cm. Pulmonic Valve: The pulmonic valve was  grossly normal. Pulmonic valve regurgitation is trivial. Aorta: The aortic root is normal in size and structure. Venous: The inferior vena cava is dilated in size with less than 50% respiratory variability, suggesting right atrial pressure of 15 mmHg. IAS/Shunts: No atrial level shunt detected by color flow Doppler. Additional Comments: There is pleural effusion in the left lateral region.  LEFT VENTRICLE PLAX 2D LVIDd:         5.20 cm     Diastology LVIDs:         4.80 cm     LV e' medial: 3.59 cm/s LV PW:         1.10 cm LV IVS:        1.10 cm LVOT diam:     1.80 cm LV SV:         22 LV SV Index:   9 LVOT Area:     2.54 cm  LV Volumes (MOD) LV vol d, MOD A4C: 86.3 ml LV vol s, MOD A4C: 77.5 ml LV SV MOD A4C:     86.3 ml RIGHT VENTRICLE            IVC RV S prime:     3.92 cm/s  IVC diam: 3.70 cm LEFT ATRIUM             Index        RIGHT ATRIUM           Index LA diam:        5.10 cm 2.21 cm/m   RA Area:     18.30 cm LA Vol (A2C):   78.0 ml 33.81 ml/m  RA Volume:   52.20 ml  22.63 ml/m LA Vol (A4C):   80.7 ml 34.98  ml/m LA Biplane Vol: 83.3 ml 36.11 ml/m  AORTIC VALVE AV Area (Vmax):    0.86 cm AV Area (Vmean):   0.79 cm AV Area (VTI):     0.79 cm AV Vmax:           155.00 cm/s AV Vmean:          105.000 cm/s AV VTI:            0.271 m AV Peak Grad:      9.6 mmHg AV Mean Grad:      5.0 mmHg LVOT Vmax:         52.30 cm/s LVOT Vmean:        32.600 cm/s LVOT VTI:          0.085 m LVOT/AV VTI ratio: 0.31  AORTA Ao Root diam: 2.80 cm TRICUSPID VALVE TR Peak grad:   21.5 mmHg TR Vmax:        232.00 cm/s  SHUNTS Systemic VTI:  0.08 m Systemic Diam: 1.80 cm Rozann Lesches MD Electronically signed by Rozann Lesches MD Signature Date/Time: 09/17/2021/6:04:34 PM    Final    US THORACENTESIS ASP PLEURAL SPACE W/IMG GUIDE  Result Date: 09/16/2021 INDICATION: Symptomatic right-sided sided pleural effusion. Please perform ultrasound-guided thoracentesis for therapeutic purposes. EXAM: US THORACENTESIS ASP PLEURAL  SPACE W/IMG GUIDE COMPARISON:  Chest radiograph-09/15/2021; chest CT-09/15/2021 MEDICATIONS: None. COMPLICATIONS: None immediate. TECHNIQUE: Informed written consent was obtained from the patient after a discussion of the risks, benefits and alternatives to treatment. A timeout was performed prior to the initiation of the procedure. Initial ultrasound scanning demonstrates a large anechoic right-sided pleural effusion. Sonographic evaluation of the left chest demonstrated a trace left-sided pleural effusion. As such, decision was made to proceed with right-sided thoracentesis. The inferolateral aspect of the right chest was prepped and draped in the usual sterile fashion. 1% lidocaine was used for local anesthesia. An ultrasound image was saved for documentation purposes. An 8 Fr Safe-T-Centesis catheter was introduced. The thoracentesis was performed. The catheter was removed and a dressing was applied. The patient tolerated the procedure well without immediate post procedural complication. The patient was escorted to have an upright chest radiograph. FINDINGS: A total of approximately 1.2 liters of serous fluid was removed. IMPRESSION: Successful ultrasound-guided right sided thoracentesis yielding 1.2 liters of pleural fluid. Electronically Signed   By: Sandi Mariscal M.D.   On: 09/16/2021 13:42    Scheduled Meds:  aspirin EC  81 mg Oral Daily   atorvastatin  80 mg Oral QPC supper   Chlorhexidine Gluconate Cloth  6 each Topical Q0600   darbepoetin (ARANESP) injection - DIALYSIS  40 mcg Intravenous Once   doxercalciferol  3 mcg Intravenous Q T,Th,Sa-HD   heparin  12,000 Units Dialysis Once in dialysis   midodrine  10 mg Oral TID WC   sodium chloride flush  3 mL Intravenous Q12H   Continuous Infusions:  sodium chloride     sodium chloride     dextrose 30 mL/hr at 09/18/21 0810   PRN Meds:.sodium chloride, sodium chloride, acetaminophen **OR** acetaminophen, albuterol, dextrose, heparin, hydrOXYzine,  ondansetron **OR** ondansetron (ZOFRAN) IV, senna-docusate  CARDIAC STUDIES:  EKG 09/15/2021: Sinus rhythm, ventricular pacing.  Echocardiogram 09/17/2021:  1. Left ventricular ejection fraction, by estimation, is <20%. The left  ventricle has severely decreased function. The left ventricle demonstrates  global hypokinesis with significant septal dyskinesis. There is mild left  ventricular hypertrophy. Left ventricular diastolic parameters are indeterminate. Elevated left ventricular end-diastolic pressure.  2. Right ventricular systolic function is moderately reduced. The right  ventricular size is moderately enlarged. There is mildly elevated  pulmonary artery systolic pressure. The estimated right ventricular  systolic pressure is 62.8 mmHg.   3. Left atrial size was mildly dilated.   4. There is a trivial pericardial effusion posterior to the left  ventricle. Left pleural effusion present.   5. The mitral valve is abnormal. Mild mitral valve regurgitation.  Moderate mitral annular calcification.   6. Tricuspid valve regurgitation is severe.   7. The aortic valve is tricuspid. There is severe calcifcation of the  aortic valve. Aortic valve regurgitation is not visualized. Possible  severe low gradient aortic valve stenosis versus pseudo-stenosis in the  setting of markedly reduced LVEF. Aortic valve mean gradient measures 5.0 mmHg. Dimentionless index 0.31.   8. The inferior vena cava is dilated in size with <50% respiratory  variability, suggesting right atrial pressure of 15 mmHg.   Echocardiogram 06/09/2021: The left ventricular size is normal with normal left ventricular wall  thickness.  Left ventricular systolic function is mildly reduced; LVEF = 40-45%.  Abnormal (paradoxical) septal motion consistent with LBBB.  The right ventricle is moderately dilated. The right ventricular  systolic function is mildly reduced.  There is overall probably moderate calcific aortic  stenosis; peak  velocity 2.2 m/s, mean gradient 9 mmHg, calculated AVA ?? Severe functional tricuspid regurgitation due to RV dilation.  Unable to compare to 2015 but by report, the LVEF is lower, the RV is  dilated, and the TR is now severe.   Echocardiogram 04/2020:  1. Left ventricular ejection fraction, by estimation, is 50 to 55%. The  left ventricle has low normal function. The left ventricle has no regional  wall motion abnormalities. Left ventricular diastolic function could not  be evaluated.   2. Right ventricular systolic function is mildly reduced. The right  ventricular size is mildly enlarged. There is normal pulmonary artery  systolic pressure. The estimated right ventricular systolic pressure is  36.6 mmHg.   3. Mild to moderate calcific mitral stenosis is present. The mitral valve  is degenerative. No evidence of mitral valve regurgitation. Mild to  moderate mitral stenosis. The mean mitral valve gradient is 4.5 mmHg with  average heart rate of 72 bpm.   4. Mild to moderate aortic stenosis is present. Vmax 2.3 m/s, MG 12.5  mmHG, AVA 1.06 cm2, DI 0.34. Gradients lower than expected due to low SVi  (21 cc/m2). The aortic valve is tricuspid. Aortic valve regurgitation is  not visualized. Mild to moderate aortic valve stenosis.   5. The inferior vena cava is dilated in size with <50% respiratory  variability, suggesting right atrial pressure of 15 mmHg.   CT abdomen 09/13/2021: Moderate bilateral pleural effusions. Compressive atelectasis in the  lower lobes.   Small to moderate ascites throughout the abdomen and pelvis.   Liver appears small, but no other definite changes of cirrhosis.  Recommend clinical correlation.   Aortic atherosclerosis.   Anasarca like edema throughout the abdominal wall.    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  LVEF was 45-50% in 05/2021 prior to leadless pacemaker placement.  Severe global hypokinesis with septal dyskinesis in the setting of RV pacing through leadless pacemaker. Known CAD s/p CABG, but no acute symptoms concerning for new ischemia. I personally reviewed and compared to previous echocardiogram in 2021, and reviewed report of echocardiogram in 05/2021 at [facility].  While he does have mild to moderate aortic stenosis, it is unlikely that his acute systolic heart failure is secondary to valvular cardiomyopathy.  In that sense, his aortic stenosis present at this time is likely pseudo stenosis in the setting of low ejection fraction.  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 However, given his extensive CAD history, recommend right and left heart catheterization with coronary and bypass graft angiography. If no reversible ischemic etiology identified, will consult EP for further management and consider BiV pacemaker. While he has had elevated lactic acid, it has stayed fairly stable and now improving.  He is comfortable at rest, does not appear to be an extremis.  While blood pressure is low normal, MAP is >70 mmHg.  No acute indication for mechanical circulatory support or inotropic support.  That said, unable to use most guideline directed medical therapy for heart failure due to dialysis patient with low normal blood pressure. Continue dialysis as per nephrology recommendations.  CAD: Continue aspirin, statin.  PAD: Left BKA in 03/2020 with occluded left popliteal artery and dry gangrene. No significant aortoiliac disease seen on angiogram at that time.  Anemia: Mild, stable. Likely combination of blood loss and CKD  Thrombocytopenia: New  but stable without new bleeding. Management as per primary team.  Total time spent with patient was 115 minutes and greater than 50% of that time was spent in counseling and coordination care with the patient regarding complex decision making and discussion as state above. Also, time was spent in reviewing and independently interpreting external records, and tests.     Nigel Mormon, MD Pager: [phone] Office: [phone]

## 2021-09-18 NOTE — [place] FL2 (Signed)
[place] MEDICAID FL2 LEVEL OF CARE SCREENING TOOL     IDENTIFICATION  Patient Name: Jonathon Bailey Birthdate: 03-20-1956 Sex: male Admission Date (Current Location): 09/15/2021  [facility] and [place] Number:  Herbalist and Address:  [facility] [place]. [facility], [place] [address] [place], [place] [address]      Provider Number: [id number]  Attending Physician Name and Address:  Shelly Coss, MD  Relative Name and Phone Number:  Tommy, [place]. Girard Cooter (Son)   [phone] (Mobile)    Current Level of Care: Hospital Recommended Level of Care: [place] Prior Approval Number:    Date Approved/Denied:   PASRR Number: [id number]  Discharge Plan: SNF    Current Diagnoses: Patient Active Problem List   Diagnosis Date Noted   Acute on chronic combined systolic and diastolic CHF (congestive heart failure) ([place]) 09/16/2021   Volume overload 09/15/2021   Acute on chronic systolic CHF (congestive heart failure) ([place]) 09/15/2021   Hypotension 09/15/2021   Lactic acidosis 09/15/2021   Thrombocytopenia ([place]) 09/15/2021   Moderate protein-calorie malnutrition ([place]) 05/27/2020   Allergy, unspecified, initial encounter 05/18/2020   Anaphylactic shock, unspecified, initial encounter 05/18/2020   ESRD on dialysis [facility])    Constipation    Noncompliance    Abdominal distension    Drug induced constipation    Leukocytosis    Essential hypertension    Labile blood pressure    Controlled type 2 diabetes mellitus with hyperglycemia, with long-term current use of insulin (HCC)    Unilateral complete BKA, left, initial encounter ([place]) 05/06/2020   Postoperative pain    Anemia in chronic kidney disease, on chronic dialysis (HCC)    Anemia of chronic disease    S/P BKA (below knee amputation) unilateral, left ([place])    Sepsis without septic shock ([place]) 04/28/2020   Wet gangrene ([place]) 04/28/2020   Fluid overload, unspecified 08/03/2019   Hyperkalemia  10/21/2018   Encounter for removal of sutures 09/22/2018   Morbid obesity with BMI of 40.0-44.9, adult ([place]) 03/05/2018   History of completed stroke 02/14/2018   Unstable angina ([place]) 02/14/2018   Dependence on renal dialysis ([place]) 11/12/2017   Chills (without fever) 01/01/2017   Local infection due to central venous catheter, initial encounter 09/08/2016   Diabetic retinopathy ([place]) 08/21/2016   Hypercalcemia 10/25/2014   Anxiety disorder, unspecified 09/10/2014   Coagulation defect, unspecified ([place]) 09/10/2014   Iron deficiency anemia, unspecified 09/10/2014   Major depressive disorder, single episode, unspecified 09/10/2014   Pain, unspecified 09/10/2014   Peritonitis, unspecified ([place]) 09/10/2014   Pruritus, unspecified 09/10/2014   Secondary hyperparathyroidism of renal origin ([place]) 09/10/2014   Unspecified osteoarthritis, unspecified site 09/10/2014   Other specified fever    Generalized abdominal pain    Acute lower GI bleeding    ESRD (end stage renal disease) ([place])    Bacterial peritonitis ([place])    Blood poisoning    ESRD on peritoneal dialysis ([place])    Acute blood loss anemia    Abdominal pain 09/01/2014   CAPD (continuous ambulatory peritoneal dialysis) status 03/15/2014   CAD S/P percutaneous coronary angioplasty 02/03/2014   H/O CHF 02/03/2014   History of cocaine use 02/03/2014   S/P coronary artery stent placement 02/03/2014   Morbid obesity ([place]) 02/01/2014   Recurrent incisional hernia x5 with incarceration s/p lap repair w mesh 03/15/2014 02/01/2014   Preventative health care 24/82/5003   Other complications due to renal dialysis device, implant, and graft 10/24/2012   COPD (chronic obstructive  pulmonary disease) ([place]) 10/23/2012   DIABETIC PERIPHERAL NEUROPATHY 01/09/2010   Type 2 diabetes mellitus with diabetic neuropathy, unspecified ([place]) 01/09/2010   ESRD (end stage renal disease) on dialysis ([place]) 08/30/2009   CEREBROVASCULAR ACCIDENT, HX OF  08/30/2009   Cerebral infarction ([place]) 08/30/2009   Diabetes mellitus type 2 with complications ([place]) 93/57/0177   HYPERLIPIDEMIA 09/21/2008   OBSTRUCTIVE SLEEP APNEA 09/21/2008   ESSENTIAL HYPERTENSION 09/21/2008   Coronary atherosclerosis 09/21/2008   Chronic combined systolic and diastolic heart failure ([place]) 09/21/2008    Orientation RESPIRATION BLADDER Height & Weight     Self, Time, Situation, Place  Normal Continent Weight: 236 lb 5.3 oz (107.2 kg) Height:  5\' 11"  (180.3 cm)  BEHAVIORAL SYMPTOMS/MOOD NEUROLOGICAL BOWEL NUTRITION STATUS      Continent Diet (see d/c summary)  AMBULATORY STATUS COMMUNICATION OF NEEDS Skin   Extensive Assist Verbally PU Stage and Appropriate Care (Pressure injury right toe)                       Personal Care Assistance Level of Assistance  Bathing, Dressing, Feeding Bathing Assistance: Maximum assistance Feeding assistance: Independent Dressing Assistance: Maximum assistance     Functional Limitations Info  Sight, Speech, Hearing Sight Info: Adequate Hearing Info: Adequate Speech Info: Adequate    SPECIAL CARE FACTORS FREQUENCY  PT (By licensed PT), OT (By licensed OT)     PT Frequency: 5x/week OT Frequency: 5x/week            Contractures Contractures Info: Not present    Additional Factors Info  Code Status, Allergies Code Status Info: Full code Allergies Info: Ferumoxytol, iron, Iodinated Contrast Media, Sildenafil           Current Medications (09/18/2021):  This is the current hospital active medication list Current Facility-Administered Medications  Medication Dose Route Frequency Provider Last Rate Last Admin   acetaminophen (TYLENOL) tablet 650 mg  650 mg Oral Q6H PRN Lenore Cordia, MD   650 mg at 09/16/21 0456   Or   acetaminophen (TYLENOL) suppository 650 mg  650 mg Rectal Q6H PRN Lenore Cordia, MD       albuterol (PROVENTIL) (2.5 MG/3ML) 0.083% nebulizer solution 0.63 mg  0.63 mg Nebulization Q6H  PRN Lenore Cordia, MD       aspirin EC tablet 81 mg  81 mg Oral Daily Gonfa, Taye T, MD   81 mg at 09/18/21 1310   atorvastatin (LIPITOR) tablet 80 mg  80 mg Oral QPC supper Lenore Cordia, MD   80 mg at 09/17/21 1706   Chlorhexidine Gluconate Cloth 2 % PADS 6 each  6 each Topical Q0600 Valentina Gu, NP   6 each at 09/17/21 1239   dextrose (GLUTOSE) 40 % oral gel 31 g  1 Tube Oral PRN Lenore Cordia, MD   31 g at 09/16/21 1238   doxercalciferol (HECTOROL) injection 3 mcg  3 mcg Intravenous Q T,Th,Sa-HD Valentina Gu, NP       hydrOXYzine (ATARAX) tablet 25 mg  25 mg Oral Q6H PRN Zada Finders R, MD   25 mg at 09/18/21 0816   midodrine (PROAMATINE) tablet 10 mg  10 mg Oral TID WC Valentina Gu, NP   10 mg at 09/18/21 1311   ondansetron (ZOFRAN) tablet 4 mg  4 mg Oral Q6H PRN Lenore Cordia, MD       Or   ondansetron (ZOFRAN) injection 4 mg  4 mg Intravenous Q6H  PRN Lenore Cordia, MD   4 mg at 09/17/21 3825   senna-docusate (Senokot-S) tablet 1 tablet  1 tablet Oral QHS PRN Zada Finders R, MD       sodium chloride flush (NS) 0.9 % injection 3 mL  3 mL Intravenous Q12H Lenore Cordia, MD   3 mL at 09/18/21 0539     Discharge Medications: Please see discharge summary for a list of discharge medications.  Relevant Imaging Results:  Relevant Lab Results:   Additional Information SSN: [id number]  Bethann Berkshire, LCSW

## 2021-09-18 NOTE — TOC Initial Note (Signed)
Transition of Care [facility]) - Initial/Assessment Note    Patient Details  Name: Jonathon Bailey MRN: [id number] Date of Birth: 1956-06-06  [facility] CM/SW Contact:    Bethann Berkshire, [place] Phone Number: 09/18/2021, 1:44 PM  Clinical Narrative:                  CSW met with pt to discuss SNF recommendation. Pt reports he came from [facility]; states he went for rehab and was only there for a few hours before having to come back to the hospital. Pt reports prior to that his son was living with him in [place]. TOC will continue to follow for DC back to [place].   Expected Discharge Plan: Skilled Nursing Facility Barriers to Discharge: Continued Medical Work up   Patient Goals and CMS Choice Patient states their goals for this hospitalization and ongoing recovery are:: Return to [place]      Expected Discharge Plan and Services Expected Discharge Plan: [place]       Living arrangements for the past 2 months: [place]                                      Prior Living Arrangements/Services Living arrangements for the past 2 months: [place] Lives with:: Facility Resident                   Activities of Daily Living Home Assistive Devices/Equipment: Wheelchair, Environmental consultant (specify type), Prosthesis ADL Screening (condition at time of admission) Patient's cognitive ability adequate to safely complete daily activities?: Yes Is the patient deaf or have difficulty hearing?: No Does the patient have difficulty seeing, even when wearing glasses/contacts?: No Does the patient have difficulty concentrating, remembering, or making decisions?: No Patient able to express need for assistance with ADLs?: Yes Does the patient have difficulty dressing or bathing?: Yes Independently performs ADLs?: No Communication: Independent Dressing (OT): Needs assistance Is this a change from baseline?: Pre-admission  baseline Grooming: Needs assistance Is this a change from baseline?: Pre-admission baseline Feeding: Independent Bathing: Needs assistance Is this a change from baseline?: Pre-admission baseline Toileting: Needs assistance Is this a change from baseline?: Pre-admission baseline In/Out Bed: Needs assistance Is this a change from baseline?: Pre-admission baseline Walks in Home: Needs assistance Is this a change from baseline?: Pre-admission baseline Does the patient have difficulty walking or climbing stairs?: Yes Weakness of Legs: Left Weakness of Arms/Hands: None  Permission Sought/Granted                  Emotional Assessment     Affect (typically observed): Restless Orientation: : Oriented to  Time, Oriented to Situation, Oriented to Place, Oriented to Self Alcohol / Substance Use: Not Applicable Psych Involvement: No (comment)  Admission diagnosis:  Lactic acidosis [E87.20] Pleural effusion [J90] Status post thoracentesis [Z98.890] Volume overload [E87.70] Acute on chronic combined systolic and diastolic CHF (congestive heart failure) (HCC) [I50.43] Hypotension, unspecified hypotension type [I95.9] Dyspnea, unspecified type [R06.00] Patient Active Problem List   Diagnosis Date Noted   Acute on chronic combined systolic and diastolic CHF (congestive heart failure) ([place]) 09/16/2021   Volume overload 09/15/2021   Acute on chronic systolic CHF (congestive heart failure) ([place]) 09/15/2021   Hypotension 09/15/2021   Lactic acidosis 09/15/2021   Thrombocytopenia ([place]) 09/15/2021   Moderate protein-calorie malnutrition ([place]) 05/27/2020   Allergy, unspecified, initial encounter 05/18/2020  Anaphylactic shock, unspecified, initial encounter 05/18/2020   ESRD on dialysis [facility])    Constipation    Noncompliance    Abdominal distension    Drug induced constipation    Leukocytosis    Essential hypertension    Labile blood pressure    Controlled type 2 diabetes mellitus  with hyperglycemia, with long-term current use of insulin (HCC)    Unilateral complete BKA, left, initial encounter ([place]) 05/06/2020   Postoperative pain    Anemia in chronic kidney disease, on chronic dialysis ([place])    Anemia of chronic disease    S/P BKA (below knee amputation) unilateral, left ([place])    Sepsis without septic shock ([place]) 04/28/2020   Wet gangrene ([place]) 04/28/2020   Fluid overload, unspecified 08/03/2019   Hyperkalemia 10/21/2018   Encounter for removal of sutures 09/22/2018   Morbid obesity with BMI of 40.0-44.9, adult ([place]) 03/05/2018   History of completed stroke 02/14/2018   Unstable angina ([place]) 02/14/2018   Dependence on renal dialysis ([place]) 11/12/2017   Chills (without fever) 01/01/2017   Local infection due to central venous catheter, initial encounter 09/08/2016   Diabetic retinopathy ([place]) 08/21/2016   Hypercalcemia 10/25/2014   Anxiety disorder, unspecified 09/10/2014   Coagulation defect, unspecified ([place]) 09/10/2014   Iron deficiency anemia, unspecified 09/10/2014   Major depressive disorder, single episode, unspecified 09/10/2014   Pain, unspecified 09/10/2014   Peritonitis, unspecified ([place]) 09/10/2014   Pruritus, unspecified 09/10/2014   Secondary hyperparathyroidism of renal origin ([place]) 09/10/2014   Unspecified osteoarthritis, unspecified site 09/10/2014   Other specified fever    Generalized abdominal pain    Acute lower GI bleeding    ESRD (end stage renal disease) ([place])    Bacterial peritonitis ([place])    Blood poisoning    ESRD on peritoneal dialysis ([place])    Acute blood loss anemia    Abdominal pain 09/01/2014   CAPD (continuous ambulatory peritoneal dialysis) status 03/15/2014   CAD S/P percutaneous coronary angioplasty 02/03/2014   H/O CHF 02/03/2014   History of cocaine use 02/03/2014   S/P coronary artery stent placement 02/03/2014   Morbid obesity ([place]) 02/01/2014   Recurrent incisional hernia x5 with incarceration s/p lap repair w  mesh 03/15/2014 02/01/2014   Preventative health care 64/15/8309   Other complications due to renal dialysis device, implant, and graft 10/24/2012   COPD (chronic obstructive pulmonary disease) ([place]) 10/23/2012   DIABETIC PERIPHERAL NEUROPATHY 01/09/2010   Type 2 diabetes mellitus with diabetic neuropathy, unspecified ([place]) 01/09/2010   ESRD (end stage renal disease) on dialysis ([place]) 08/30/2009   CEREBROVASCULAR ACCIDENT, HX OF 08/30/2009   Cerebral infarction ([place]) 08/30/2009   Diabetes mellitus type 2 with complications ([place]) 40/76/8088   HYPERLIPIDEMIA 09/21/2008   OBSTRUCTIVE SLEEP APNEA 09/21/2008   ESSENTIAL HYPERTENSION 09/21/2008   Coronary atherosclerosis 09/21/2008   Chronic combined systolic and diastolic heart failure ([place]) 09/21/2008   PCP:  Clinic, [place]:  No Pharmacies Listed    Social Determinants of Health (SDOH) Interventions    Readmission Risk Interventions Readmission Risk Prevention Plan 05/06/2020  Transportation Screening Complete  PCP or Specialist Appt within 3-5 Days Complete  HRI or [place] Complete  Social Work Consult for [place] Planning/Counseling Complete  Palliative Care Screening Not Applicable  Medication Review Press photographer) Complete  Some recent data might be hidden

## 2021-09-18 NOTE — Consult Note (Addendum)
CARDIOLOGY CONSULT NOTE  Patient ID: Jonathon Bailey MRN: [id number] DOB/AGE: 06/22/1956 65 y.o.  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  He sees: Sunday Spillers for his primary cardiac care.  He has been experiencing worsening fatigue, shortness of breath, as well as generalized distention.  At the same time, he reports that he has actually been losing weight has not been eating as much.  He has had chronic pleuritic chest pain for >40-month, but no recent acute chest pain.  He denies having had any pain prior to hospital admission, on ambulation.  On a separate note, he has recently had bright red bleeding per rectus. He underwent colonoscopy and was found to have large internal hemorrhoids.   Past Medical History:  Diagnosis Date   Anxiety    Arthritis    Asthma    "as a child"   CAD (coronary artery disease)    Chronic systolic heart failure ([place]) 09/21/2008   ECHO Feb 2013 showed LVEF low normal at 50-55%, +hypokinetic anterolateral wall and inferolateral wall.     CKD (chronic kidney disease) stage 4, GFR 15-29 ml/min (HCC) 08/30/2009   Progressive renal failure since 2008, creatinine 1.2 in 2008 up to 3.5 in 2012 and 3.2-5.0 in 2013. All UA's 2011-13 showed >300 protein on dipstick. Work-up in May 2011 showed negative Urine IFE and SPEP, ultrasound showed 12-13 cm kidneys with increased echogenicity and UPC ratio was 1.5 gm proteinuria.  Hgb A1C's from 2011 to 2013 were all between 9-11.  Patient saw Dr. Donnetta Hutching (vasc surgery) for HD access in Aug 2013 >  vein mapping was done and Dr. Donnetta Hutching felt the left arm (pt is R handed) was suitable for L arm Cimino radiocephalic fistula. Patient said he wasn't ready to consider doing dialysis and declined the surgery.      Depression    Diabetic retinopathy ([place])    ESRD (end stage renal disease) on dialysis ([place])    Headache(784.0)    "maybe monthly" (03/15/2014)   Hyperlipidemia    Hypertension    Macular edema    Myocardial infarction [facility])    status post MI x2 and 3 stents placed in 2003   Obesity    Sleep apnea    does not use CPAP   Stroke ([place]) ~ 2007; ~1987   "weak on right side; messed w/right side of brain; cry all the time"   Type II diabetes mellitus ([place])    insulin dependent     Past Surgical History:  Procedure Laterality Date   ABDOMINAL AORTOGRAM W/LOWER EXTREMITY N/A 04/29/2020   Procedure: ABDOMINAL AORTOGRAM W/LOWER EXTREMITY;  Surgeon: Angelia Mould, MD;  Location: [place] CV LAB;  Service: Cardiovascular;  Laterality: N/A;   AMPUTATION Left 05/02/2020   Procedure: AMPUTATION BELOW KNEE;  Surgeon: Rosetta Posner, MD;  Location: [place];  Service: Vascular;  Laterality: Left;   AV FISTULA PLACEMENT  10/17/2012   Procedure: ARTERIOVENOUS (AV) FISTULA CREATION;  Surgeon: Mal Misty, MD;  Location: [place];  Service: Vascular;  Laterality: Left;   BONE EXOSTOSIS  EXCISION Right 06/04/2019   Procedure: EXOSTOSIS EXCISION;  Surgeon: Leanora Cover, MD;  Location: [place];  Service: Orthopedics;  Laterality: Right;   CAPD INSERTION N/A 03/15/2014   Procedure: LAPAROSCOPIC INSERTION CONTINUOUS AMBULATORY PERITONEAL DIALYSIS CATHETER, LAPARASCOPIC INCISIONAL HERNIA  REPAIR  WITH MESH, OMENTOPEXY AND LYSIS OF ADHESIONS;  Surgeon: [name], MD;  Location: [place];  Service: General;  Laterality: N/A;   CORONARY ANGIOPLASTY WITH STENT PLACEMENT  ~ 2002   "3"   CORONARY ANGIOPLASTY WITH STENT PLACEMENT  01/24/2004   successful PCI/stenting RCA  drug eluting  cypher stent   CORONARY ANGIOPLASTY WITH STENT PLACEMENT  01/28/2004   successful stentin of a large bifurcation marginal branch of the ramus intermediate vessel   FLEXIBLE SIGMOIDOSCOPY N/A 09/05/2014   Procedure: FLEXIBLE SIGMOIDOSCOPY;  Surgeon: Cleotis Nipper, MD;  Location: [place];  Service: Endoscopy;  Laterality: N/A;   HERNIA REPAIR     INCISION AND DRAINAGE ABSCESS N/A 09/06/2014   Procedure: REMOVAL OF PD CATH;  Surgeon: Coralie Keens, MD;  Location: [place];  Service: General;  Laterality: N/A;   INCISIONAL HERNIA REPAIR  03/15/2014   LAPAROSCOPIC LYSIS OF ADHESIONS  03/15/2014   NM MYOCAR PERF WALL MOTION  02/06/2012   normal perfusion scan   PERITONEAL CATHETER INSERTION  03/15/2014   REFRACTIVE SURGERY Bilateral    SHUNTOGRAM N/A 03/11/2013   Procedure: Fistulogram;  Surgeon: Conrad Plaquemine, MD;  Location[facility] C[facility] LAB;  Service: Cardiovascular;  Laterality: N/A;   TENDON TRANSFER Right 06/04/2019   Procedure: RIGHT HAND EXTENSOR TENDON TRANSFER TO SMALL FINGER;  Surgeon: Leanora Cover, MD;  Location: [place];  Service: Orthopedics;  Laterality: Right;   UMBILICAL HERNIA REPAIR        Family History  Problem Relation Age of Onset   Asthma Mother    Hyperlipidemia Mother    Hypertension Mother    Stroke Father    Heart attack Father    Prostate cancer Father    Deep vein thrombosis Father    Cancer Father    Diabetes Father    Hyperlipidemia Father    Hypertension Father    Other Father        varicose veins   Heart disease Father        before age 64   Other Sister        varicose veins     Social History: Social History   Socioeconomic History   Marital status: Divorced    Spouse name: Not on file   Number of children: Not on file   Years of education: Not on file   Highest education level: Not on file  Occupational History   Not on file  Tobacco Use   Smoking status: Former    Packs/day: 1.00    Years: 15.00    Pack years:  15.00    Types: Cigarettes    Quit date: 09/24/1986    Years since quitting: 35.0   Smokeless tobacco: Never  Vaping Use   Vaping Use: Never used  Substance and Sexual Activity   Alcohol use: Not Currently    Comment: stopped in 2013   Drug use: No   Sexual activity: Not Currently  Other Topics Concern   Not on file  Social History Narrative   Not on file   Social Determinants of Health   Financial Resource Strain: Not on file  Food Insecurity: Not on file  Transportation Needs: Not on file  Physical Activity: Not on file  Stress: Not on file  Social Connections: Not on file  Intimate Partner Violence: Not on file     Medications Prior to Admission  Medication Sig Dispense Refill Last Dose   acetaminophen (TYLENOL) 500 MG tablet Take 1,000 mg by mouth every 6 (six) hours as needed for headache (pain).   unknown   albuterol (ACCUNEB) 0.63 MG/3ML nebulizer solution Take 1 ampule by nebulization every 6 (six) hours as needed for wheezing.   unknown   albuterol (PROVENTIL HFA;VENTOLIN HFA) 108 (90 BASE) MCG/ACT inhaler Inhale 2 puffs into the lungs every 6 (six) hours as needed (COPD).   unknown   aspirin 325 MG tablet Take 325 mg by mouth daily.   unknown   atorvastatin (LIPITOR) 80 MG tablet Take 80 mg by mouth daily after supper.   unknown   B Complex Vitamins (VITAMIN B COMPLEX) TABS Take 1 tablet by mouth every Monday, Wednesday, and Friday. Dialysis days   unknown   cinacalcet (SENSIPAR) 90 MG tablet Take 90 mg by mouth daily.   unknown   epoetin alfa (EPOGEN) 10000 UNIT/ML injection Inject 10,000 Units into the skin every 3 (three) days.   unknown   fluticasone (FLONASE) 50 MCG/ACT nasal spray Place 1 spray into both nostrils daily.   unknown   folic acid (FOLVITE) 563 MCG tablet Take 400 mcg by mouth every Monday, Wednesday, and Friday. Dialysis days   unknown   hydrOXYzine (ATARAX) 25 MG tablet Take 25 mg by mouth every 6 (six) hours as needed for itching (allergies).    unknown   insulin lispro (HUMALOG) 100 UNIT/ML injection Inject 2-12 Units into the skin See admin instructions. Inject 2-12 units subcutaneously three times daily before meals per sliding scale - CBG 201-250 2 units, 251-300 4 units, 301-350 6 units, 351-400 8 units, 401-450 10 units, >450 12 units and call MD   unknown   lactulose (CHRONULAC) 10 GM/15ML solution Take 20 g by mouth daily as needed (constipation).   unknown   lanthanum (FOSRENOL) 500 MG chewable tablet Chew 500 mg by mouth See admin instructions. Chew one tablet (500 mg) by mouth with the largest meal of the day   unknown   loperamide (IMODIUM) 2 MG capsule Take 2 mg by mouth daily as needed for diarrhea or loose stools.   unknown   metoCLOPramide (REGLAN) 5 MG tablet Take 5 mg by mouth 3 (three) times daily as needed for nausea.   unknown   midodrine (PROAMATINE) 5 MG tablet Take 5 mg by mouth 3 (three) times daily after meals. Hold for SBP >130   unknown   nitroGLYCERIN (NITROSTAT) 0.4 MG SL tablet Place 0.4 mg under the tongue every 5 (five) minutes as needed for chest pain.   unknown   pantoprazole (PROTONIX) 40 MG tablet Take 1 tablet (40 mg total) by mouth daily. (Patient taking differently: Take 40 mg by mouth daily at 6 (six) AM.) 30 tablet 0 09/15/2021 at am   polyethylene glycol (MIRALAX / GLYCOLAX) packet Take 17 g by mouth daily. (Patient taking differently: Take 17 g by mouth daily as needed (constipation). Mix with 6 oz fluid) 14 each 0 unknown   tiotropium (SPIRIVA) 18 MCG inhalation capsule Place 18 mcg into inhaler and inhale daily as needed (COPD).   unknown   vitamin C (ASCORBIC ACID) 500 MG tablet Take 500 mg by mouth daily.   unknown    Review of Systems  Constitutional: Positive for decreased appetite and  malaise/fatigue. Negative for weight gain and weight loss.  HENT:  Negative for congestion.   Eyes:  Negative for visual disturbance.  Cardiovascular:  Positive for dyspnea on exertion and leg swelling.  Negative for chest pain, palpitations and syncope.  Respiratory:  Negative for cough.   Endocrine: Negative for cold intolerance.  Hematologic/Lymphatic: Does not bruise/bleed easily.  Skin:  Negative for itching and rash.  Musculoskeletal:  Negative for myalgias.  Gastrointestinal:  Positive for bloating. Negative for abdominal pain, nausea and vomiting.  Genitourinary:  Negative for dysuria.  Neurological:  Negative for dizziness and weakness.  Psychiatric/Behavioral:  The patient is not nervous/anxious.   All other systems reviewed and are negative.    Physical Exam: Physical Exam Vitals and nursing note reviewed.  Constitutional:      General: He is not in acute distress.    Appearance: He is well-developed.  HENT:     Head: Normocephalic and atraumatic.  Eyes:     Conjunctiva/sclera: Conjunctivae normal.     Pupils: Pupils are equal, round, and reactive to light.  Neck:     Vascular: JVD present.  Cardiovascular:     Rate and Rhythm: Normal rate and regular rhythm.     Pulses: Normal pulses and intact distal pulses.     Heart sounds: No murmur heard. Pulmonary:     Effort: Pulmonary effort is normal.     Breath sounds: Decreased breath sounds present. No wheezing or rales.  Abdominal:     General: Bowel sounds are normal.     Palpations: Abdomen is soft.     Tenderness: There is no rebound.     Comments: Abdominal distention  Musculoskeletal:        General: No tenderness. Normal range of motion.     Right lower leg: Edema (2+) present.     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  Lymphadenopathy:     Cervical: No cervical adenopathy.  Skin:    General: Skin is warm and dry.  Neurological:     Mental Status: He is alert and oriented to person, place, and time.     Cranial Nerves: No cranial nerve deficit.     Labs:   Lab Results  Component Value Date   WBC 5.6 09/18/2021   HGB 9.6 (L) 09/18/2021   HCT 29.6 (L) 09/18/2021   MCV 80.0  09/18/2021   PLT 77 (L) 09/18/2021    Recent Labs  Lab 09/16/21 0427 09/16/21 1507 09/18/21 0740  NA 134*   < > 135  K 3.9   < > 4.3  CL 96*   < > 96*  CO2 25   < > 26  BUN 13   < > 15  CREATININE 5.01*   < > 5.14*  CALCIUM 7.2*   < > 6.9*  PROT 7.4  --   --   BILITOT 2.0*  --   --   ALKPHOS 142*  --   --   ALT 33  --   --   AST 54*  --   --   GLUCOSE 71   < > 74   < > = values in this interval not displayed.    Lipid Panel     Component Value Date/Time   CHOL 142 04/29/2020 0428   TRIG 177 (H) 04/29/2020 0428   HDL 28 (L) 04/29/2020 0428   CHOLHDL 5.1 04/29/2020 0428   VLDL 35 04/29/2020 0428   LDLCALC 79 04/29/2020 0428  BNP (last 3 results) No results for input(s): BNP in the last 8760 hours.  HEMOGLOBIN A1C Lab Results  Component Value Date   HGBA1C 5.5 09/17/2021   MPG 111.15 09/17/2021    Cardiac Panel (last 3 results) No results for input(s): CKTOTAL, CKMB, RELINDX in the last 8760 hours.  Invalid input(s): TROPONINHS  Lab Results  Component Value Date   EPPIRJJ 884 (H) 11/13/2011   CKMB 8.1 (HH) 11/13/2011     TSH No results for input(s): TSH in the last 8760 hours.    Radiology: DG Chest 1 View  Result Date: 09/16/2021 CLINICAL DATA:  Post thoracentesis. EXAM: CHEST  1 VIEW COMPARISON:  09/15/2021 (CT and chest x-ray) FINDINGS: Stable cardiac silhouette.  Large 4 central venous line. Interval decrease in RIGHT pleural effusion. Moderate RIGHT pleural effusion remains. No pneumothorax. LEFT effusion also noted unchanged. IMPRESSION: Mild decrease in RIGHT pleural effusion.  No pneumothorax. Electronically Signed   By: Suzy Bouchard M.D.   On: 09/16/2021 12:43   ECHOCARDIOGRAM COMPLETE  Result Date: 09/17/2021    ECHOCARDIOGRAM REPORT   Patient Name:   Jonathon Bailey Date of Exam: 09/17/2021 Medical Rec #:  [id number]          Height:       71.0 in Accession #:    [id number]         Weight:       247.1 lb Date of Birth:  [birth year]           BSA:          2.307 m Patient Age:    53 years           BP:           86/67 mmHg Patient Gender: M                  HR:           84 bpm. Exam Location:  Inpatient Procedure: 2D Echo Indications:    dyspnea  History:        Patient has prior history of Echocardiogram examinations, most                 recent 05/02/2020. End stage renal disease; Risk Factors:Diabetes,                 Dyslipidemia, Hypertension and Sleep Apnea.  Sonographer:    [place] Referring Phys: [id number] TAYE T GONFA IMPRESSIONS  1. Left ventricular ejection fraction, by estimation, is <20%. The left ventricle has severely decreased function. The left ventricle demonstrates global hypokinesis with significant septal dyskinesis. There is mild left ventricular hypertrophy. Left ventricular diastolic parameters are indeterminate. Elevated left ventricular end-diastolic pressure.  2. Right ventricular systolic function is moderately reduced. The right ventricular size is moderately enlarged. There is mildly elevated pulmonary artery systolic pressure. The estimated right ventricular systolic pressure is 27.0 mmHg.  3. Left atrial size was mildly dilated.  4. There is a trivial pericardial effusion posterior to the left ventricle. Left pleural effusion present.  5. The mitral valve is abnormal. Mild mitral valve regurgitation. Moderate mitral annular calcification.  6. Tricuspid valve regurgitation is severe.  7. The aortic valve is tricuspid. There is severe calcifcation of the aortic valve. Aortic valve regurgitation is not visualized. Possible severe low gradient aortic valve stenosis versus pseudo-stenosis in the setting of markedly reduced LVEF. Aortic valve mean gradient measures 5.0 mmHg. Dimentionless index 0.31.  8. The inferior vena cava  is dilated in size with <50% respiratory variability, suggesting right atrial pressure of 15 mmHg. Comparison(s): Prior images reviewed side by side. LVEF is now markedly reduced with  significant septal dyskinesis. Moderate RV dysfunction. Aortic stenosis possible severe range with low gradient although could be pseudo-stenosis in setting of significantly reduced LVEF. FINDINGS  Left Ventricle: Left ventricular ejection fraction, by estimation, is <20%. The left ventricle has severely decreased function. The left ventricle demonstrates global hypokinesis. The left ventricular internal cavity size was normal in size. There is mild left ventricular hypertrophy. Left ventricular diastolic parameters are indeterminate. Elevated left ventricular end-diastolic pressure. Right Ventricle: The right ventricular size is moderately enlarged. No increase in right ventricular wall thickness. Right ventricular systolic function is moderately reduced. There is mildly elevated pulmonary artery systolic pressure. The tricuspid regurgitant velocity is 2.32 m/s, and with an assumed right atrial pressure of 15 mmHg, the estimated right ventricular systolic pressure is 21.3 mmHg. Left Atrium: Left atrial size was mildly dilated. Right Atrium: Right atrial size was normal in size. Pericardium: Trivial pericardial effusion is present. The pericardial effusion is posterior to the left ventricle. Mitral Valve: The mitral valve is abnormal. There is mild thickening of the mitral valve leaflet(s). There is mild calcification of the mitral valve leaflet(s). Moderate mitral annular calcification. Mild mitral valve regurgitation. Tricuspid Valve: The tricuspid valve is grossly normal. Tricuspid valve regurgitation is severe. Aortic Valve: The aortic valve is tricuspid. There is severe calcifcation of the aortic valve. There is moderate aortic valve annular calcification. Aortic valve regurgitation is not visualized. Severe aortic stenosis is present. Aortic valve mean gradient measures 5.0 mmHg. Aortic valve peak gradient measures 9.6 mmHg. Aortic valve area, by VTI measures 0.79 cm. Pulmonic Valve: The pulmonic valve was  grossly normal. Pulmonic valve regurgitation is trivial. Aorta: The aortic root is normal in size and structure. Venous: The inferior vena cava is dilated in size with less than 50% respiratory variability, suggesting right atrial pressure of 15 mmHg. IAS/Shunts: No atrial level shunt detected by color flow Doppler. Additional Comments: There is pleural effusion in the left lateral region.  LEFT VENTRICLE PLAX 2D LVIDd:         5.20 cm     Diastology LVIDs:         4.80 cm     LV e' medial: 3.59 cm/s LV PW:         1.10 cm LV IVS:        1.10 cm LVOT diam:     1.80 cm LV SV:         22 LV SV Index:   9 LVOT Area:     2.54 cm  LV Volumes (MOD) LV vol d, MOD A4C: 86.3 ml LV vol s, MOD A4C: 77.5 ml LV SV MOD A4C:     86.3 ml RIGHT VENTRICLE            IVC RV S prime:     3.92 cm/s  IVC diam: 3.70 cm LEFT ATRIUM             Index        RIGHT ATRIUM           Index LA diam:        5.10 cm 2.21 cm/m   RA Area:     18.30 cm LA Vol (A2C):   78.0 ml 33.81 ml/m  RA Volume:   52.20 ml  22.63 ml/m LA Vol (A4C):   80.7 ml 34.98  ml/m LA Biplane Vol: 83.3 ml 36.11 ml/m  AORTIC VALVE AV Area (Vmax):    0.86 cm AV Area (Vmean):   0.79 cm AV Area (VTI):     0.79 cm AV Vmax:           155.00 cm/s AV Vmean:          105.000 cm/s AV VTI:            0.271 m AV Peak Grad:      9.6 mmHg AV Mean Grad:      5.0 mmHg LVOT Vmax:         52.30 cm/s LVOT Vmean:        32.600 cm/s LVOT VTI:          0.085 m LVOT/AV VTI ratio: 0.31  AORTA Ao Root diam: 2.80 cm TRICUSPID VALVE TR Peak grad:   21.5 mmHg TR Vmax:        232.00 cm/s  SHUNTS Systemic VTI:  0.08 m Systemic Diam: 1.80 cm Rozann Lesches MD Electronically signed by Rozann Lesches MD Signature Date/Time: 09/17/2021/6:04:34 PM    Final    US THORACENTESIS ASP PLEURAL SPACE W/IMG GUIDE  Result Date: 09/16/2021 INDICATION: Symptomatic right-sided sided pleural effusion. Please perform ultrasound-guided thoracentesis for therapeutic purposes. EXAM: US THORACENTESIS ASP PLEURAL  SPACE W/IMG GUIDE COMPARISON:  Chest radiograph-09/15/2021; chest CT-09/15/2021 MEDICATIONS: None. COMPLICATIONS: None immediate. TECHNIQUE: Informed written consent was obtained from the patient after a discussion of the risks, benefits and alternatives to treatment. A timeout was performed prior to the initiation of the procedure. Initial ultrasound scanning demonstrates a large anechoic right-sided pleural effusion. Sonographic evaluation of the left chest demonstrated a trace left-sided pleural effusion. As such, decision was made to proceed with right-sided thoracentesis. The inferolateral aspect of the right chest was prepped and draped in the usual sterile fashion. 1% lidocaine was used for local anesthesia. An ultrasound image was saved for documentation purposes. An 8 Fr Safe-T-Centesis catheter was introduced. The thoracentesis was performed. The catheter was removed and a dressing was applied. The patient tolerated the procedure well without immediate post procedural complication. The patient was escorted to have an upright chest radiograph. FINDINGS: A total of approximately 1.2 liters of serous fluid was removed. IMPRESSION: Successful ultrasound-guided right sided thoracentesis yielding 1.2 liters of pleural fluid. Electronically Signed   By: Sandi Mariscal M.D.   On: 09/16/2021 13:42    Scheduled Meds:  aspirin EC  81 mg Oral Daily   atorvastatin  80 mg Oral QPC supper   Chlorhexidine Gluconate Cloth  6 each Topical Q0600   darbepoetin (ARANESP) injection - DIALYSIS  40 mcg Intravenous Once   doxercalciferol  3 mcg Intravenous Q T,Th,Sa-HD   heparin  12,000 Units Dialysis Once in dialysis   midodrine  10 mg Oral TID WC   sodium chloride flush  3 mL Intravenous Q12H   Continuous Infusions:  sodium chloride     sodium chloride     dextrose 30 mL/hr at 09/18/21 0810   PRN Meds:.sodium chloride, sodium chloride, acetaminophen **OR** acetaminophen, albuterol, dextrose, heparin, hydrOXYzine,  ondansetron **OR** ondansetron (ZOFRAN) IV, senna-docusate  CARDIAC STUDIES:  EKG 09/15/2021: Sinus rhythm, ventricular pacing.  Echocardiogram 09/17/2021:  1. Left ventricular ejection fraction, by estimation, is <20%. The left  ventricle has severely decreased function. The left ventricle demonstrates  global hypokinesis with significant septal dyskinesis. There is mild left  ventricular hypertrophy. Left ventricular diastolic parameters are indeterminate. Elevated left ventricular end-diastolic pressure.  2. Right ventricular systolic function is moderately reduced. The right  ventricular size is moderately enlarged. There is mildly elevated  pulmonary artery systolic pressure. The estimated right ventricular  systolic pressure is 85.0 mmHg.   3. Left atrial size was mildly dilated.   4. There is a trivial pericardial effusion posterior to the left  ventricle. Left pleural effusion present.   5. The mitral valve is abnormal. Mild mitral valve regurgitation.  Moderate mitral annular calcification.   6. Tricuspid valve regurgitation is severe.   7. The aortic valve is tricuspid. There is severe calcifcation of the  aortic valve. Aortic valve regurgitation is not visualized. Possible  severe low gradient aortic valve stenosis versus pseudo-stenosis in the  setting of markedly reduced LVEF. Aortic valve mean gradient measures 5.0 mmHg. Dimentionless index 0.31.   8. The inferior vena cava is dilated in size with <50% respiratory  variability, suggesting right atrial pressure of 15 mmHg.   Echocardiogram 06/09/2021: The left ventricular size is normal with normal left ventricular wall  thickness.  Left ventricular systolic function is mildly reduced; LVEF = 40-45%.  Abnormal (paradoxical) septal motion consistent with LBBB.  The right ventricle is moderately dilated. The right ventricular  systolic function is mildly reduced.  There is overall probably moderate calcific aortic  stenosis; peak  velocity 2.2 m/s, mean gradient 9 mmHg, calculated AVA ?? Severe functional tricuspid regurgitation due to RV dilation.  Unable to compare to 2015 but by report, the LVEF is lower, the RV is  dilated, and the TR is now severe.   Echocardiogram 04/2020:  1. Left ventricular ejection fraction, by estimation, is 50 to 55%. The  left ventricle has low normal function. The left ventricle has no regional  wall motion abnormalities. Left ventricular diastolic function could not  be evaluated.   2. Right ventricular systolic function is mildly reduced. The right  ventricular size is mildly enlarged. There is normal pulmonary artery  systolic pressure. The estimated right ventricular systolic pressure is  27.7 mmHg.   3. Mild to moderate calcific mitral stenosis is present. The mitral valve  is degenerative. No evidence of mitral valve regurgitation. Mild to  moderate mitral stenosis. The mean mitral valve gradient is 4.5 mmHg with  average heart rate of 72 bpm.   4. Mild to moderate aortic stenosis is present. Vmax 2.3 m/s, MG 12.5  mmHG, AVA 1.06 cm2, DI 0.34. Gradients lower than expected due to low SVi  (21 cc/m2). The aortic valve is tricuspid. Aortic valve regurgitation is  not visualized. Mild to moderate aortic valve stenosis.   5. The inferior vena cava is dilated in size with <50% respiratory  variability, suggesting right atrial pressure of 15 mmHg.   CT abdomen 09/13/2021: Moderate bilateral pleural effusions. Compressive atelectasis in the  lower lobes.   Small to moderate ascites throughout the abdomen and pelvis.   Liver appears small, but no other definite changes of cirrhosis.  Recommend clinical correlation.   Aortic atherosclerosis.   Anasarca like edema throughout the abdominal wall.    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  LVEF was 45-50% in 05/2021 prior to leadless pacemaker placement.  Severe global hypokinesis with septal dyskinesis in the setting of RV pacing through leadless pacemaker. Known CAD s/p CABG, but no acute symptoms concerning for new ischemia. I personally reviewed and compared to previous echocardiogram in 2021, and reviewed report of echocardiogram in 05/2021 at [facility].  While he does have mild to moderate aortic stenosis, it is unlikely that his acute systolic heart failure is secondary to valvular cardiomyopathy.  In that sense, his aortic stenosis present at this time is likely pseudo stenosis in the setting of low ejection fraction.  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 However, given his extensive CAD history, recommend right and left heart catheterization with coronary and bypass graft angiography. If no reversible ischemic etiology identified, will consult EP for further management and consider BiV pacemaker. While he has had elevated lactic acid, it has stayed fairly stable and now improving.  He is comfortable at rest, does not appear to be an extremis.  While blood pressure is low normal, MAP is >70 mmHg.  No acute indication for mechanical circulatory support or inotropic support.  That said, unable to use most guideline directed medical therapy for heart failure due to dialysis patient with low normal blood pressure. Continue dialysis as per nephrology recommendations.  CAD: Continue aspirin, statin.  PAD: Left BKA in 03/2020 with occluded left popliteal artery and dry gangrene. No significant aortoiliac disease seen on angiogram at that time.  Anemia: Mild, stable. Likely combination of blood loss and CKD  Thrombocytopenia: New  but stable without new bleeding. Management as per primary team.  Total time spent with patient was 115 minutes and greater than 50% of that time was spent in counseling and coordination care with the patient regarding complex decision making and discussion as state above. Also, time was spent in reviewing and independently interpreting external records, and tests.     Nigel Mormon, MD Pager: [phone] Office: [phone]

## 2021-09-18 NOTE — Progress Notes (Signed)
PROGRESS NOTE    Jonathon Bailey  RDE:[id number] DOB: 09-Feb-1956 DOA: 09/15/2021 PCP: [facility]   Chief Complain: Shortness of breath  Brief Narrative: Patient is a 65 year old male with history of ESRD on dialysis Monday Wednesday Friday schedule, coronary artery disease status post CABG, stents, systolic CHF, AV block status post pacemaker placement, diabetes type 2, CVA, left BKA, hypertension, hyperlipidemia who was brought to the emergency department from dialysis center with complaints of shortness of breath.  On presentation he was hyportensive.  Chest x-ray showed features of CHF.  CT imaging showed large bilateral pleural effusions, free fluid in the abdomen.  Nephrology consulted for dialysis.  Underwent thoracentesis.  Echo done here showed decreased ejection fraction from prior, cardiology consulted today  Assessment & Plan:   Principal Problem:   Volume overload Active Problems:   Diabetes mellitus type 2 with complications (HCC)   Coronary atherosclerosis   Anemia in chronic kidney disease, on chronic dialysis (HCC)   ESRD on dialysis ([place])   Acute on chronic systolic CHF (congestive heart failure) (HCC)   Hypotension   Lactic acidosis   Thrombocytopenia (HCC)   Acute on chronic combined systolic and diastolic CHF (congestive heart failure) (HCC)   Volume overload/acute on chronic combined systolic/diastolic congestive heart failure: Recently hospitalized for the same at [facility] dialysis here, nephrology following.  Plan for serial dialysis.Next HD is today Last echo done on 05/2021 showed EF of 50 to 55%, indeterminate diastolic dysfunction but previous echo had shown grade 2 diastolic dysfunction. Chest  imaging was suggestive of vascular congestion, bilateral pleural effusions, abdominal free fluid, anasarca. New echo done here shows EF of less than 20%, global hypokinesis with significant septal dyskinesis, indeterminate left  ventricular diastolic parameters.  We consulted cardiology  Bilateral pleural effusion: Underwent right-sided thoracentesis with removal of 1.2 L of fluid on 12/24  Hypertension: Blood pressure still soft.  On midodrine  Lactic acidosis: Most likely from hypoperfusion in the setting of hypotension,third spacing/volume overload.  Continue monitor.  No indication for antibiotic treatment.  Blood cultures no growth to date.  Insulin-dependent type 2 diabetes: Recent A1c of 7.9%.  Continue monitoring blood sugars. He was hypoglycemic and was started on   D10,now stopped  History of coronary artery disease: Status post CABG, stents.  No anginal symptoms.Echo as above.Cardiology will be consulted  AV block: Status post pacemaker placement on 9/22  Thrombocytopenia: Stable, chronic.  Without any signs of bleeding  Normocytic anemia: Patient with ESRD.  Continue to monitor.  On ESA and IV iron per nephrology  Debility/deconditioning/left BKA: Wheelchair-bound  Obesity: BMI of 33.4         DVT prophylaxis:SCD Code Status: Full Family Communication: None at bedside Patient status:Inpatient  Dispo: The patient is from: Home              Anticipated d/c is to: Home              Anticipated d/c date is: Unsure at this point  Consultants: Nephrology  Procedures: Hemodialysis, thoracentesis  Antimicrobials:  Anti-infectives (From admission, onward)    Start     Dose/Rate Route Frequency Ordered Stop   09/15/21 1930  piperacillin-tazobactam (ZOSYN) IVPB 3.375 g        3.375 g 100 mL/hr over 30 Minutes Intravenous  Once 09/15/21 1921 09/15/21 2345       Subjective:  Patient seen and examined at the bedside this morning.  On dialysis suite.  Blood pressure slightly better  than yesterday.  Denies any shortness of breath or cough.  Complains of some chest discomfort.  Overall looks comfortable.   Objective: Vitals:   09/18/21 0006 09/18/21 0451 09/18/21 0625 09/18/21 0724  BP:  94/74 (!) 78/59 94/66 103/71  Pulse: 85 79 86 85  Resp: 16 18 19 17   Temp:  97.7 F (36.5 C)  97.9 F (36.6 C)  TempSrc:  Oral  Oral  SpO2: 94% 91% 97% 100%  Weight:  108.8 kg    Height:        Intake/Output Summary (Last 24 hours) at 09/18/2021 0729 Last data filed at 09/18/2021 0600 Gross per 24 hour  Intake 839.17 ml  Output 0 ml  Net 839.17 ml   Filed Weights   09/16/21 1510 09/17/21 0834 09/18/21 0451  Weight: 108.8 kg 112.1 kg 108.8 kg    Examination:   General exam: Chronically ill looking, deconditioned, weak HEENT: PERRL Respiratory system:  no wheezes or crackles  Cardiovascular system: S1 & S2 heard, RRR.  Dialysis catheter in the chest Gastrointestinal system: Abdomen is nondistended, soft and nontender. Central nervous system: Alert and oriented Extremities: No edema, no clubbing ,no cyanosis, left BKA Skin: No rashes, no ulcers,no icterus     Data Reviewed: I have personally reviewed following labs and imaging studies  CBC: Recent Labs  Lab 09/15/21 1826 09/16/21 0427 09/17/21 0442  WBC 4.7 4.9 5.3  NEUTROABS 2.5  --   --   HGB 9.3* 9.6* 8.9*  HCT 30.6* 30.4* 27.5*  MCV 83.6 83.5 79.9*  PLT 71* 73* 75*   Basic Metabolic Panel: Recent Labs  Lab 09/15/21 1826 09/16/21 0427 09/16/21 1507 09/17/21 0442  NA 136 134* 135 134*  K 3.8 3.9 4.2 4.2  CL 96* 96* 97* 97*  CO2 27 25 25 25   GLUCOSE 65* 71 107* 70  BUN 10 13 16 17   CREATININE 4.44* 5.01* 5.44* 5.83*  CALCIUM 7.3* 7.2* 7.3* 7.0*  MG  --  2.1  --  2.1  PHOS  --   --  3.4 3.4   GFR: Estimated Creatinine Clearance: 16.1 mL/min (A) (by C-G formula based on SCr of 5.83 mg/dL (H)). Liver Function Tests: Recent Labs  Lab 09/15/21 1826 09/16/21 0427 09/16/21 1507 09/17/21 0442  AST 49* 54*  --   --   ALT 30 33  --   --   ALKPHOS 147* 142*  --   --   BILITOT 2.1* 2.0*  --   --   PROT 7.5 7.4  --   --   ALBUMIN 2.7* 2.6* 2.4* 2.2*   Recent Labs  Lab 09/15/21 1826  LIPASE 31    No results for input(s): AMMONIA in the last 168 hours. Coagulation Profile: No results for input(s): INR, PROTIME in the last 168 hours. Cardiac Enzymes: No results for input(s): CKTOTAL, CKMB, CKMBINDEX, TROPONINI in the last 168 hours. BNP (last 3 results) No results for input(s): PROBNP in the last 8760 hours. HbA1C: Recent Labs    09/17/21 0442  HGBA1C 5.5   CBG: Recent Labs  Lab 09/16/21 1230 09/16/21 1300 09/16/21 1355  GLUCAP 53* 63* 99   Lipid Profile: No results for input(s): CHOL, HDL, LDLCALC, TRIG, CHOLHDL, LDLDIRECT in the last 72 hours. Thyroid Function Tests: No results for input(s): TSH, T4TOTAL, FREET4, T3FREE, THYROIDAB in the last 72 hours. Anemia Panel: No results for input(s): VITAMINB12, FOLATE, FERRITIN, TIBC, IRON, RETICCTPCT in the last 72 hours. Sepsis Labs: Recent Labs  Lab 09/15/21  1827 09/16/21 0427 09/17/21 0442  LATICACIDVEN 4.0* 5.2* 4.3*    Recent Results (from the past 240 hour(s))  Culture, blood (routine x 2)     Status: None (Preliminary result)   Collection Time: 09/15/21  6:28 PM   Specimen: Site Not Specified; Blood  Result Value Ref Range Status   Specimen Description SITE NOT SPECIFIED  Final   Special Requests   Final    BOTTLES DRAWN AEROBIC AND ANAEROBIC Blood Culture results may not be optimal due to an excessive volume of blood received in culture bottles   Culture   Final    NO GROWTH 3 DAYS Performed at [facility], [place] [address], [place], [place] [address]    Report Status PENDING  Incomplete  Culture, blood (routine x 2)     Status: None (Preliminary result)   Collection Time: 09/15/21  6:29 PM   Specimen: Site Not Specified; Blood  Result Value Ref Range Status   Specimen Description SITE NOT SPECIFIED  Final   Special Requests   Final    BOTTLES DRAWN AEROBIC AND ANAEROBIC Blood Culture results may not be optimal due to an excessive volume of blood received in culture bottles   Culture   Final     NO GROWTH 3 DAYS Performed at [facility], [place] [address], [place], [place] [address]    Report Status PENDING  Incomplete  Resp Panel by RT-PCR (Flu A&B, Covid) Nasopharyngeal Swab     Status: None   Collection Time: 09/16/21  2:55 AM   Specimen: Nasopharyngeal Swab; Nasopharyngeal(NP) swabs in vial transport medium  Result Value Ref Range Status   SARS Coronavirus 2 by RT PCR NEGATIVE NEGATIVE Final    Comment: (NOTE) SARS-CoV-2 target nucleic acids are NOT DETECTED.  The SARS-CoV-2 RNA is generally detectable in upper respiratory specimens during the acute phase of infection. The lowest concentration of SARS-CoV-2 viral copies this assay can detect is 138 copies/mL. A negative result does not preclude SARS-Cov-2 infection and should not be used as the sole basis for treatment or other patient management decisions. A negative result may occur with  improper specimen collection/handling, submission of specimen other than nasopharyngeal swab, presence of viral mutation(s) within the areas targeted by this assay, and inadequate number of viral copies(<138 copies/mL). A negative result must be combined with clinical observations, patient history, and epidemiological information. The expected result is Negative.  Fact Sheet for Patients:  EntrepreneurPulse.com.au  Fact Sheet for Healthcare Providers:  IncredibleEmployment.be  This test is no t yet approved or cleared by the [place] FDA and  has been authorized for detection and/or diagnosis of SARS-CoV-2 by FDA under an Emergency Use Authorization (EUA). This EUA will remain  in effect (meaning this test can be used) for the duration of the COVID-19 declaration under Section 564(b)(1) of the Act, 21 U.S.C.section 360bbb-3(b)(1), unless the authorization is terminated  or revoked sooner.       Influenza A by PCR NEGATIVE NEGATIVE Final   Influenza B by PCR NEGATIVE NEGATIVE Final     Comment: (NOTE) The Xpert Xpress SARS-CoV-2/FLU/RSV plus assay is intended as an aid in the diagnosis of influenza from Nasopharyngeal swab specimens and should not be used as a sole basis for treatment. Nasal washings and aspirates are unacceptable for Xpert Xpress SARS-CoV-2/FLU/RSV testing.  Fact Sheet for Patients: EntrepreneurPulse.com.au  Fact Sheet for Healthcare Providers: IncredibleEmployment.be  This test is not yet approved or cleared by the [place] FDA  and has been authorized for detection and/or diagnosis of SARS-CoV-2 by FDA under an Emergency Use Authorization (EUA). This EUA will remain in effect (meaning this test can be used) for the duration of the COVID-19 declaration under Section 564(b)(1) of the Act, 21 U.S.C. section 360bbb-3(b)(1), unless the authorization is terminated or revoked.  Performed at [facility], [place] [address], [place], [place] [address]          Radiology Studies: DG Chest 1 View  Result Date: 09/16/2021 CLINICAL DATA:  Post thoracentesis. EXAM: CHEST  1 VIEW COMPARISON:  09/15/2021 (CT and chest x-ray) FINDINGS: Stable cardiac silhouette.  Large 4 central venous line. Interval decrease in RIGHT pleural effusion. Moderate RIGHT pleural effusion remains. No pneumothorax. LEFT effusion also noted unchanged. IMPRESSION: Mild decrease in RIGHT pleural effusion.  No pneumothorax. Electronically Signed   By: Suzy Bouchard M.D.   On: 09/16/2021 12:43   ECHOCARDIOGRAM COMPLETE  Result Date: 09/17/2021    ECHOCARDIOGRAM REPORT   Patient Name:   Jonathon Bailey Date of Exam: 09/17/2021 Medical Rec #:  [id number]          Height:       71.0 in Accession #:    [id number]         Weight:       247.1 lb Date of Birth:  01-19-1956          BSA:          2.307 m Patient Age:    21 years           BP:           86/67 mmHg Patient Gender: M                  HR:           84 bpm. Exam Location:   Inpatient Procedure: 2D Echo Indications:    dyspnea  History:        Patient has prior history of Echocardiogram examinations, most                 recent 05/02/2020. End stage renal disease; Risk Factors:Diabetes,                 Dyslipidemia, Hypertension and Sleep Apnea.  Sonographer:    [place] Referring Phys: [id number] TAYE T GONFA IMPRESSIONS  1. Left ventricular ejection fraction, by estimation, is <20%. The left ventricle has severely decreased function. The left ventricle demonstrates global hypokinesis with significant septal dyskinesis. There is mild left ventricular hypertrophy. Left ventricular diastolic parameters are indeterminate. Elevated left ventricular end-diastolic pressure.  2. Right ventricular systolic function is moderately reduced. The right ventricular size is moderately enlarged. There is mildly elevated pulmonary artery systolic pressure. The estimated right ventricular systolic pressure is 62.8 mmHg.  3. Left atrial size was mildly dilated.  4. There is a trivial pericardial effusion posterior to the left ventricle. Left pleural effusion present.  5. The mitral valve is abnormal. Mild mitral valve regurgitation. Moderate mitral annular calcification.  6. Tricuspid valve regurgitation is severe.  7. The aortic valve is tricuspid. There is severe calcifcation of the aortic valve. Aortic valve regurgitation is not visualized. Possible severe low gradient aortic valve stenosis versus pseudo-stenosis in the setting of markedly reduced LVEF. Aortic valve mean gradient measures 5.0 mmHg. Dimentionless index 0.31.  8. The inferior vena cava is dilated in size with <50% respiratory variability, suggesting right atrial pressure of 15 mmHg. Comparison(s):  Prior images reviewed side by side. LVEF is now markedly reduced with significant septal dyskinesis. Moderate RV dysfunction. Aortic stenosis possible severe range with low gradient although could be pseudo-stenosis in setting of  significantly reduced LVEF. FINDINGS  Left Ventricle: Left ventricular ejection fraction, by estimation, is <20%. The left ventricle has severely decreased function. The left ventricle demonstrates global hypokinesis. The left ventricular internal cavity size was normal in size. There is mild left ventricular hypertrophy. Left ventricular diastolic parameters are indeterminate. Elevated left ventricular end-diastolic pressure. Right Ventricle: The right ventricular size is moderately enlarged. No increase in right ventricular wall thickness. Right ventricular systolic function is moderately reduced. There is mildly elevated pulmonary artery systolic pressure. The tricuspid regurgitant velocity is 2.32 m/s, and with an assumed right atrial pressure of 15 mmHg, the estimated right ventricular systolic pressure is 09.7 mmHg. Left Atrium: Left atrial size was mildly dilated. Right Atrium: Right atrial size was normal in size. Pericardium: Trivial pericardial effusion is present. The pericardial effusion is posterior to the left ventricle. Mitral Valve: The mitral valve is abnormal. There is mild thickening of the mitral valve leaflet(s). There is mild calcification of the mitral valve leaflet(s). Moderate mitral annular calcification. Mild mitral valve regurgitation. Tricuspid Valve: The tricuspid valve is grossly normal. Tricuspid valve regurgitation is severe. Aortic Valve: The aortic valve is tricuspid. There is severe calcifcation of the aortic valve. There is moderate aortic valve annular calcification. Aortic valve regurgitation is not visualized. Severe aortic stenosis is present. Aortic valve mean gradient measures 5.0 mmHg. Aortic valve peak gradient measures 9.6 mmHg. Aortic valve area, by VTI measures 0.79 cm. Pulmonic Valve: The pulmonic valve was grossly normal. Pulmonic valve regurgitation is trivial. Aorta: The aortic root is normal in size and structure. Venous: The inferior vena cava is dilated in size  with less than 50% respiratory variability, suggesting right atrial pressure of 15 mmHg. IAS/Shunts: No atrial level shunt detected by color flow Doppler. Additional Comments: There is pleural effusion in the left lateral region.  LEFT VENTRICLE PLAX 2D LVIDd:         5.20 cm     Diastology LVIDs:         4.80 cm     LV e' medial: 3.59 cm/s LV PW:         1.10 cm LV IVS:        1.10 cm LVOT diam:     1.80 cm LV SV:         22 LV SV Index:   9 LVOT Area:     2.54 cm  LV Volumes (MOD) LV vol d, MOD A4C: 86.3 ml LV vol s, MOD A4C: 77.5 ml LV SV MOD A4C:     86.3 ml RIGHT VENTRICLE            IVC RV S prime:     3.92 cm/s  IVC diam: 3.70 cm LEFT ATRIUM             Index        RIGHT ATRIUM           Index LA diam:        5.10 cm 2.21 cm/m   RA Area:     18.30 cm LA Vol (A2C):   78.0 ml 33.81 ml/m  RA Volume:   52.20 ml  22.63 ml/m LA Vol (A4C):   80.7 ml 34.98 ml/m LA Biplane Vol: 83.3 ml 36.11 ml/m  AORTIC VALVE AV Area (Vmax):  0.86 cm AV Area (Vmean):   0.79 cm AV Area (VTI):     0.79 cm AV Vmax:           155.00 cm/s AV Vmean:          105.000 cm/s AV VTI:            0.271 m AV Peak Grad:      9.6 mmHg AV Mean Grad:      5.0 mmHg LVOT Vmax:         52.30 cm/s LVOT Vmean:        32.600 cm/s LVOT VTI:          0.085 m LVOT/AV VTI ratio: 0.31  AORTA Ao Root diam: 2.80 cm TRICUSPID VALVE TR Peak grad:   21.5 mmHg TR Vmax:        232.00 cm/s  SHUNTS Systemic VTI:  0.08 m Systemic Diam: 1.80 cm Rozann Lesches MD Electronically signed by Rozann Lesches MD Signature Date/Time: 09/17/2021/6:04:34 PM    Final    US THORACENTESIS ASP PLEURAL SPACE W/IMG GUIDE  Result Date: 09/16/2021 INDICATION: Symptomatic right-sided sided pleural effusion. Please perform ultrasound-guided thoracentesis for therapeutic purposes. EXAM: US THORACENTESIS ASP PLEURAL SPACE W/IMG GUIDE COMPARISON:  Chest radiograph-09/15/2021; chest CT-09/15/2021 MEDICATIONS: None. COMPLICATIONS: None immediate. TECHNIQUE: Informed written  consent was obtained from the patient after a discussion of the risks, benefits and alternatives to treatment. A timeout was performed prior to the initiation of the procedure. Initial ultrasound scanning demonstrates a large anechoic right-sided pleural effusion. Sonographic evaluation of the left chest demonstrated a trace left-sided pleural effusion. As such, decision was made to proceed with right-sided thoracentesis. The inferolateral aspect of the right chest was prepped and draped in the usual sterile fashion. 1% lidocaine was used for local anesthesia. An ultrasound image was saved for documentation purposes. An 8 Fr Safe-T-Centesis catheter was introduced. The thoracentesis was performed. The catheter was removed and a dressing was applied. The patient tolerated the procedure well without immediate post procedural complication. The patient was escorted to have an upright chest radiograph. FINDINGS: A total of approximately 1.2 liters of serous fluid was removed. IMPRESSION: Successful ultrasound-guided right sided thoracentesis yielding 1.2 liters of pleural fluid. Electronically Signed   By: Sandi Mariscal M.D.   On: 09/16/2021 13:42        Scheduled Meds:  aspirin EC  81 mg Oral Daily   atorvastatin  80 mg Oral QPC supper   Chlorhexidine Gluconate Cloth  6 each Topical Q0600   darbepoetin (ARANESP) injection - DIALYSIS  40 mcg Intravenous Once   doxercalciferol  3 mcg Intravenous Q T,Th,Sa-HD   heparin  12,000 Units Dialysis Once in dialysis   midodrine  10 mg Oral TID WC   sodium chloride flush  3 mL Intravenous Q12H   Continuous Infusions:  sodium chloride     sodium chloride     dextrose 30 mL/hr at 09/17/21 2033     LOS: 2 days    Time spent: 25 mins.More than 50% of that time was spent in counseling and/or coordination of care.      Shelly Coss, MD [facility] P12/26/2022, 7:29 AM

## 2021-09-18 NOTE — Progress Notes (Signed)
°  [facility] Progress Note    Assessment/ Plan:    Acute on chronic systolic HF: Serial HD for volume removal. Increase midodrine to facilitate volume removal. S/p thora 12/24 with 1.2L removed. UF as tolerated  ESRD -  Recently changed to MWF. HD today.   Hypertension/volume  - CXR 12/24 w/ bilat pleural effusions, vascular congestion. See # 1. Increased midodrine dose.   Anemia  - HGB down to 8.9. ESA ordered for today.   Metabolic bone disease -  Corrected Ca 8.3. PO4 wnl. Continue VDRA, sensipar, binders.   Nutrition - Renal/Carb mod diet Albumin 2.6. Add protein supps.  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  Dialysis access: [facility]. Refuses avf/avg Debility/ deconditioning/ L BKA - pt is WC bound GOC - will consult palliative for GOC     [name], MD 09/18/2021, 2:49 PM      Subjective:   Seen in HD, BP's marginal, new echo showing very low LVEF under 15%   Objective:   BP 95/60    Pulse 81    Temp (!) 97.4 F (36.3 C)    Resp 18    Ht 5\' 11"  (1.803 m)    Wt 107.2 kg    SpO2 98%    BMI 32.96 kg/m   Intake/Output Summary (Last 24 hours) at 09/18/2021 1439 Last data filed at 09/18/2021 1300 Gross per 24 hour  Intake 842.17 ml  Output 2001 ml  Net -1158.83 ml    Weight change: 3.3 kg  Physical Exam: Gen:nad CVS:s1s2, rrr Resp:decreased air entry bibasilar, no w/r/r/c, unlabored, bl chest expansion QJJ:HERDE, soft Ext:1+ rle edema Neuro: awake, alert Dialysis access: [facility]   Labs: BMET Recent Labs  Lab 09/15/21 1826 09/16/21 0427 09/16/21 1507 09/17/21 0442 09/18/21 0740  NA 136 134* 135 134* 135  K 3.8 3.9 4.2 4.2 4.3  CL 96* 96* 97* 97* 96*  CO2 27 25 25 25 26   GLUCOSE 65* 71 107* 70 74  BUN 10 13 16 17 15   CREATININE 4.44* 5.01* 5.44* 5.83* 5.14*  CALCIUM 7.3* 7.2* 7.3* 7.0* 6.9*  PHOS  --   --  3.4 3.4 2.9    CBC Recent Labs  Lab 09/15/21 1826 09/16/21 0427 09/17/21 0442 09/18/21 0740   WBC 4.7 4.9 5.3 5.6  NEUTROABS 2.5  --   --  2.9  HGB 9.3* 9.6* 8.9* 9.6*  HCT 30.6* 30.4* 27.5* 29.6*  MCV 83.6 83.5 79.9* 80.0  PLT 71* 73* 75* 77*     Medications:     aspirin EC  81 mg Oral Daily   atorvastatin  80 mg Oral QPC supper   Chlorhexidine Gluconate Cloth  6 each Topical Q0600   doxercalciferol  3 mcg Intravenous Q T,Th,Sa-HD   midodrine  10 mg Oral TID WC   sodium chloride flush  3 mL Intravenous Q12H

## 2021-09-19 ENCOUNTER — Encounter (HOSPITAL_COMMUNITY): Payer: Self-pay | Admitting: *Deleted

## 2021-09-19 ENCOUNTER — Encounter (HOSPITAL_COMMUNITY)
Admission: EM | Disposition: A | Discharge status: Skilled Nursing Facility | Payer: Self-pay | Source: Ambulatory Visit | Attending: Internal Medicine

## 2021-09-19 ENCOUNTER — Encounter (HOSPITAL_COMMUNITY): Payer: Self-pay | Admitting: Cardiology

## 2021-09-19 DIAGNOSIS — E118 Type 2 diabetes mellitus with unspecified complications: Secondary | ICD-10-CM

## 2021-09-19 DIAGNOSIS — E8779 Other fluid overload: Secondary | ICD-10-CM

## 2021-09-19 DIAGNOSIS — T81509A Unspecified complication of foreign body accidentally left in body following unspecified procedure, initial encounter: Secondary | ICD-10-CM

## 2021-09-19 DIAGNOSIS — I5043 Acute on chronic combined systolic (congestive) and diastolic (congestive) heart failure: Secondary | ICD-10-CM

## 2021-09-19 DIAGNOSIS — Z992 Dependence on renal dialysis: Secondary | ICD-10-CM

## 2021-09-19 DIAGNOSIS — N186 End stage renal disease: Secondary | ICD-10-CM

## 2021-09-19 HISTORY — PX: RIGHT/LEFT HEART CATH AND CORONARY/GRAFT ANGIOGRAPHY: CATH118267

## 2021-09-19 HISTORY — PX: FOREIGN BODY RETRIEVAL: CATH118241

## 2021-09-19 LAB — POCT I-STAT EG7
Acid-Base Excess: 0 mmol/L (ref 0.0–2.0)
Acid-Base Excess: 0 mmol/L (ref 0.0–2.0)
Acid-Base Excess: 1 mmol/L (ref 0.0–2.0)
Bicarbonate: 25.6 mmol/L (ref 20.0–28.0)
Bicarbonate: 25.8 mmol/L (ref 20.0–28.0)
Bicarbonate: 26.4 mmol/L (ref 20.0–28.0)
Calcium, Ion: 0.85 mmol/L — CL (ref 1.15–1.40)
Calcium, Ion: 0.85 mmol/L — CL (ref 1.15–1.40)
Calcium, Ion: 0.87 mmol/L — CL (ref 1.15–1.40)
HCT: 34 % — ABNORMAL LOW (ref 39.0–52.0)
HCT: 34 % — ABNORMAL LOW (ref 39.0–52.0)
HCT: 34 % — ABNORMAL LOW (ref 39.0–52.0)
Hemoglobin: 11.6 g/dL — ABNORMAL LOW (ref 13.0–17.0)
Hemoglobin: 11.6 g/dL — ABNORMAL LOW (ref 13.0–17.0)
Hemoglobin: 11.6 g/dL — ABNORMAL LOW (ref 13.0–17.0)
O2 Saturation: 55 %
O2 Saturation: 56 %
O2 Saturation: 63 %
Potassium: 4.5 mmol/L (ref 3.5–5.1)
Potassium: 4.5 mmol/L (ref 3.5–5.1)
Potassium: 4.5 mmol/L (ref 3.5–5.1)
Sodium: 136 mmol/L (ref 135–145)
Sodium: 137 mmol/L (ref 135–145)
Sodium: 137 mmol/L (ref 135–145)
TCO2: 27 mmol/L (ref 22–32)
TCO2: 27 mmol/L (ref 22–32)
TCO2: 28 mmol/L (ref 22–32)
pCO2, Ven: 45.2 mmHg (ref 44.0–60.0)
pCO2, Ven: 45.5 mmHg (ref 44.0–60.0)
pCO2, Ven: 46.9 mmHg (ref 44.0–60.0)
pH, Ven: 7.359 (ref 7.250–7.430)
pH, Ven: 7.361 (ref 7.250–7.430)
pH, Ven: 7.362 (ref 7.250–7.430)
pO2, Ven: 30 mmHg — CL (ref 32.0–45.0)
pO2, Ven: 31 mmHg — CL (ref 32.0–45.0)
pO2, Ven: 35 mmHg (ref 32.0–45.0)

## 2021-09-19 LAB — CBC
HCT: 28.2 % — ABNORMAL LOW (ref 39.0–52.0)
HCT: 30.7 % — ABNORMAL LOW (ref 39.0–52.0)
Hemoglobin: 9.2 g/dL — ABNORMAL LOW (ref 13.0–17.0)
Hemoglobin: 9.8 g/dL — ABNORMAL LOW (ref 13.0–17.0)
MCH: 26.3 pg (ref 26.0–34.0)
MCH: 26.1 pg (ref 26.0–34.0)
MCHC: 32.6 g/dL (ref 30.0–36.0)
MCHC: 31.9 g/dL (ref 30.0–36.0)
MCV: 80.6 fL (ref 80.0–100.0)
MCV: 81.9 fL (ref 80.0–100.0)
Platelets: 67 10*3/uL — ABNORMAL LOW (ref 150–400)
Platelets: 75 10*3/uL — ABNORMAL LOW (ref 150–400)
RBC: 3.5 MIL/uL — ABNORMAL LOW (ref 4.22–5.81)
RBC: 3.75 MIL/uL — ABNORMAL LOW (ref 4.22–5.81)
RDW: 21.6 % — ABNORMAL HIGH (ref 11.5–15.5)
RDW: 22 % — ABNORMAL HIGH (ref 11.5–15.5)
WBC: 6 10*3/uL (ref 4.0–10.5)
WBC: 5 10*3/uL (ref 4.0–10.5)
nRBC: 0.5 % — ABNORMAL HIGH (ref 0.0–0.2)
nRBC: 0.4 % — ABNORMAL HIGH (ref 0.0–0.2)

## 2021-09-19 LAB — GLUCOSE, CAPILLARY
Glucose-Capillary: 76 mg/dL (ref 70–99)
Glucose-Capillary: 80 mg/dL (ref 70–99)
Glucose-Capillary: 72 mg/dL (ref 70–99)
Glucose-Capillary: 75 mg/dL (ref 70–99)
Glucose-Capillary: 101 mg/dL — ABNORMAL HIGH (ref 70–99)
Glucose-Capillary: 133 mg/dL — ABNORMAL HIGH (ref 70–99)

## 2021-09-19 LAB — BASIC METABOLIC PANEL
Anion gap: 12 (ref 5–15)
BUN: 11 mg/dL (ref 8–23)
CO2: 25 mmol/L (ref 22–32)
Calcium: 7.1 mg/dL — ABNORMAL LOW (ref 8.9–10.3)
Chloride: 98 mmol/L (ref 98–111)
Creatinine, Ser: 4.29 mg/dL — ABNORMAL HIGH (ref 0.61–1.24)
GFR, Estimated: 15 mL/min — ABNORMAL LOW (ref >60)
Glucose, Bld: 77 mg/dL (ref 70–99)
Potassium: 4.2 mmol/L (ref 3.5–5.1)
Sodium: 135 mmol/L (ref 135–145)

## 2021-09-19 LAB — LACTIC ACID, PLASMA: Lactic Acid, Venous: 3.6 mmol/L (ref 0.5–1.9)

## 2021-09-19 LAB — CREATININE, SERUM
Creatinine, Ser: 4.68 mg/dL — ABNORMAL HIGH (ref 0.61–1.24)
GFR, Estimated: 13 mL/min — ABNORMAL LOW (ref >60)

## 2021-09-19 SURGERY — RIGHT/LEFT HEART CATH AND CORONARY/GRAFT ANGIOGRAPHY
Anesthesia: LOCAL

## 2021-09-19 MED ORDER — METHYLPREDNISOLONE SODIUM SUCC 125 MG IJ SOLR
125.0000 mg | Freq: Once | INTRAMUSCULAR | Status: AC
  Administered 2021-09-19: 08:00:00 125 mg via INTRAVENOUS
  Filled 2021-09-19: qty 2

## 2021-09-19 MED ORDER — LIDOCAINE HCL (PF) 1 % IJ SOLN
INTRAMUSCULAR | Status: DC | PRN
  Administered 2021-09-19: 2 mL
  Administered 2021-09-19: 10 mL

## 2021-09-19 MED ORDER — IOHEXOL 350 MG/ML SOLN
INTRAVENOUS | Status: DC | PRN
  Administered 2021-09-19: 12:00:00 40 mL

## 2021-09-19 MED ORDER — FENTANYL CITRATE (PF) 100 MCG/2ML IJ SOLN
INTRAMUSCULAR | Status: AC
  Filled 2021-09-19: qty 2

## 2021-09-19 MED ORDER — HEPARIN (PORCINE) IN NACL 1000-0.9 UT/500ML-% IV SOLN
INTRAVENOUS | Status: AC
  Filled 2021-09-19: qty 1000

## 2021-09-19 MED ORDER — LIDOCAINE HCL (PF) 1 % IJ SOLN
INTRAMUSCULAR | Status: AC
  Filled 2021-09-19: qty 30

## 2021-09-19 MED ORDER — FENTANYL CITRATE (PF) 100 MCG/2ML IJ SOLN
INTRAMUSCULAR | Status: DC | PRN
  Administered 2021-09-19: 50 ug via INTRAVENOUS
  Administered 2021-09-19 (×3): 25 ug via INTRAVENOUS

## 2021-09-19 MED ORDER — DIPHENHYDRAMINE HCL 50 MG/ML IJ SOLN
25.0000 mg | Freq: Once | INTRAMUSCULAR | Status: AC
  Administered 2021-09-19: 08:00:00 25 mg via INTRAVENOUS
  Filled 2021-09-19: qty 1

## 2021-09-19 MED ORDER — HEPARIN (PORCINE) IN NACL 1000-0.9 UT/500ML-% IV SOLN
INTRAVENOUS | Status: DC | PRN
  Administered 2021-09-19: 1000 mL

## 2021-09-19 MED ORDER — SODIUM CHLORIDE 0.9% FLUSH: 3.0000 mL | INTRAVENOUS | Status: DC | PRN

## 2021-09-19 MED ORDER — DIPHENHYDRAMINE HCL 50 MG/ML IJ SOLN
INTRAMUSCULAR | Status: AC
  Filled 2021-09-19: qty 1

## 2021-09-19 MED ORDER — CEFAZOLIN SODIUM-DEXTROSE 2-4 GM/100ML-% IV SOLN
INTRAVENOUS | Status: AC
  Filled 2021-09-19: qty 100

## 2021-09-19 MED ORDER — CEFAZOLIN SODIUM-DEXTROSE 2-3 GM-%(50ML) IV SOLR
INTRAVENOUS | Status: AC | PRN
  Administered 2021-09-19: 2 g via INTRAVENOUS

## 2021-09-19 MED ORDER — SODIUM CHLORIDE 0.9 % IV SOLN: 250.0000 mL | INTRAVENOUS | Status: DC | PRN

## 2021-09-19 MED ORDER — HEPARIN SODIUM (PORCINE) 1000 UNIT/ML DIALYSIS
6000.0000 [IU] | INTRAMUSCULAR | Status: DC | PRN
  Filled 2021-09-19: qty 6

## 2021-09-19 MED ORDER — HEPARIN SODIUM (PORCINE) 5000 UNIT/ML IJ SOLN
5000.0000 [IU] | Freq: Three times a day (TID) | INTRAMUSCULAR | Status: DC
  Administered 2021-09-20 – 2021-09-22 (×9): 5000 [IU] via SUBCUTANEOUS
  Filled 2021-09-19 (×9): qty 1

## 2021-09-19 MED ORDER — SODIUM CHLORIDE 0.9% FLUSH
3.0000 mL | Freq: Two times a day (BID) | INTRAVENOUS | Status: DC
  Administered 2021-09-20 – 2021-09-21 (×4): 3 mL via INTRAVENOUS

## 2021-09-19 SURGICAL SUPPLY — 16 items
CATH BALLN WEDGE 5F 110CM (CATHETERS) ×1 IMPLANT
CATH INFINITI 5FR JL4 (CATHETERS) ×1 IMPLANT
CATH INFINITI JR4 5F (CATHETERS) ×1 IMPLANT
CLOSURE PERCLOSE PROSTYLE (VASCULAR PRODUCTS) ×2 IMPLANT
DEVICE EN SNARE MINI 2-4MM (VASCULAR PRODUCTS) ×1 IMPLANT
DEVICE ONE SNARE 10MM (MISCELLANEOUS) ×1 IMPLANT
KIT ESSENTIALS PG (KITS) ×1 IMPLANT
KIT HEART LEFT (KITS) ×2 IMPLANT
PACK CARDIAC CATHETERIZATION (CUSTOM PROCEDURE TRAY) ×2 IMPLANT
SHEATH GLIDE SLENDER 4/5FR (SHEATH) ×1 IMPLANT
SHEATH PINNACLE 6F 10CM (SHEATH) ×1 IMPLANT
TRANSDUCER W/STOPCOCK (MISCELLANEOUS) ×2 IMPLANT
TUBING CIL FLEX 10 FLL-RA (TUBING) ×2 IMPLANT
WIRE EMERALD 3MM-J .035X150CM (WIRE) ×1 IMPLANT
WIRE MICRO SET SILHO 5FR 7 (SHEATH) ×1 IMPLANT
WIRE MICROINTRODUCER 60CM (WIRE) ×1 IMPLANT

## 2021-09-19 NOTE — Progress Notes (Signed)
[facility] Progress Note    Assessment/ Plan:    Acute on chronic systolic HF: Serial HD for volume removal. Increased midodrine to facilitate volume removal. S/p thora 12/24 with 1.2L removed. UF was 2L yesterday w/ HD.   ESRD -  Recently changed to MWF.  Next HD tomorrow  Hypotension/volume  - CXR 12/24 w/ bilat pleural effusions, vascular congestion. Increased midodrine dose. Repeat CXR w/o edema 12/24 post thoracentesis. 5kg under dry wt, losing body wt. Doubt volume overload. Small UF next HD.   Anemia  - HGB down to 8.9. ESA ordered for today.   Metabolic bone disease -  Corrected Ca 8.3. PO4 wnl. Continue VDRA, sensipar, binders.   Nutrition - Renal/Carb mod diet Albumin 2.6. Add protein supps.  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  Dialysis access: [facility]. Refuses avf/avg Debility/ deconditioning/ L BKA - pt is WC bound GOC - will consult palliative for GOC     [name], MD 09/19/2021, 1:03 PM    OP HD: [facility] MWF   4.5h  F250  400/800   111kg  2/2 bath  P2  TDC RIJ   Heparin 12K -Hectorol 3 mcg IV TIW -Mircera 30 mcg IV q 4 weeks (Last dose11/25/2022)    Subjective:   Seen in room, O x3, no new c/o's   Objective:   BP 120/76    Pulse (!) 0    Temp (!) 97.5 F (36.4 C) (Oral)    Resp (!) 0    Ht 5\' 11"  (1.803 m)    Wt 105.7 kg    SpO2 (!) 0%    BMI 32.50 kg/m   Intake/Output Summary (Last 24 hours) at 09/19/2021 1303 Last data filed at 09/19/2021 0600 Gross per 24 hour  Intake 340.18 ml  Output 0 ml  Net 340.18 ml    Weight change: -4.1 kg  Physical Exam: Gen:nad, very weak and somewhat debilitated CVS:s1s2, rrr Resp:decreased air entry bibasilar, no w/r/r/c, unlabored, bl chest expansion QVZ:DGLOV, soft Ext:1+ RLE edema, L BKA Neuro: awake, alert Dialysis access: [facility]   Labs: BMET Recent Labs  Lab 09/15/21 1826 09/16/21 0427 09/16/21 1507 09/17/21 0442 09/18/21 0740 09/19/21 0519   NA 136 134* 135 134* 135 135  K 3.8 3.9 4.2 4.2 4.3 4.2  CL 96* 96* 97* 97* 96* 98  CO2 27 25 25 25 26 25   GLUCOSE 65* 71 107* 70 74 77  BUN 10 13 16 17 15 11   CREATININE 4.44* 5.01* 5.44* 5.83* 5.14* 4.29*  CALCIUM 7.3* 7.2* 7.3* 7.0* 6.9* 7.1*  PHOS  --   --  3.4 3.4 2.9  --     CBC Recent Labs  Lab 09/15/21 1826 09/16/21 0427 09/17/21 0442 09/18/21 0740 09/19/21 0519  WBC 4.7 4.9 5.3 5.6 6.0  NEUTROABS 2.5  --   --  2.9  --   HGB 9.3* 9.6* 8.9* 9.6* 9.2*  HCT 30.6* 30.4* 27.5* 29.6* 28.2*  MCV 83.6 83.5 79.9* 80.0 80.6  PLT 71* 73* 75* 77* 67*     Medications:     [START ON 09/20/2021] aspirin EC  81 mg Oral Daily   atorvastatin  80 mg Oral QPC supper   Chlorhexidine Gluconate Cloth  6 each Topical Q0600   doxercalciferol  3 mcg Intravenous Q T,Th,Sa-HD   midodrine  10 mg Oral TID WC   sodium chloride flush  3 mL Intravenous Q12H

## 2021-09-19 NOTE — Progress Notes (Signed)
Pt BS is 66. He does have NPO order after midnight for procedure- no schedule yet for Cath. Pt insisted to eat his food tray as he didn't eat the whole day as he claims and argued with this nurse. Pt agreed not to eat after he finished his tray.

## 2021-09-19 NOTE — Consult Note (Signed)
ELECTROPHYSIOLOGY CONSULT NOTE    Patient ID: MILAS SCHAPPELL MRN: [id number], DOB/AGE: [birth year] 65 y.o.  Admit date: 09/15/2021 Date of Consult: 09/19/2021  Primary Physician: [facility], [facility] Primary Cardiologist: None  Electrophysiologist:  New  Referring Provider: Dr. Virgina Jock  Patient Profile: Jonathon Bailey is a 65 y.o. 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 Virgina Jock.  HPI:  Jonathon BRUMETT is a 65 y.o. male with medical history as above.   Admitted with complaints of worsening fatigue, SOB, and generalized distention  Found to have new systolic CHF with EF <56% this admission down from EF 40-45% [facility].  Of note, during admission 05/2021 pt had Leadless PPM placed in setting of CHB and limited vascular access in setting of ESRD with failed L fistula and L perm cath.  L/RHC today shows known severe native disease with patent grafts.  LM: Occluded. LAD: Supplied by LIMA to LAD which is widely patent. CX: Supplied by SVG to OM1 which is widely patent. RCA: Occluded in the proximal segment.  Supplied by SVG to RCA.  RCA small. Preserved cardiac output at 7.52, CI 3.32  He is awake and responds to questioning, but drifts off to sleep and seems to have some mild confusion. Hasn't fell well the past several months but overall is not a great historian otherwise.  Past Medical History:  Diagnosis Date   Anxiety    Arthritis    Asthma    "as a child"   CAD (coronary artery disease)    Chronic systolic heart failure ([place]) 09/21/2008   ECHO Feb 2013 showed LVEF low normal at 50-55%, +hypokinetic anterolateral wall and inferolateral wall.     CKD  (chronic kidney disease) stage 4, GFR 15-29 ml/min (HCC) 08/30/2009   Progressive renal failure since 2008, creatinine 1.2 in 2008 up to 3.5 in 2012 and 3.2-5.0 in 2013. All UA's 2011-13 showed >300 protein on dipstick. Work-up in May 2011 showed negative Urine IFE and SPEP, ultrasound showed 12-13 cm kidneys with increased echogenicity and UPC ratio was 1.5 gm proteinuria.  Hgb A1C's from 2011 to 2013 were all between 9-11.  Patient saw Dr. Donnetta Hutching (vasc surgery) for HD access in Aug 2013 > vein mapping was done and Dr. Donnetta Hutching felt the left arm (pt is R handed) was suitable for L arm Cimino radiocephalic fistula. Patient said he wasn't ready to consider doing dialysis and declined the surgery.      Depression    Diabetic retinopathy ([place])    ESRD (end stage renal disease) on dialysis ([place])    Headache(784.0)    "maybe monthly" (03/15/2014)   Hyperlipidemia    Hypertension    Macular edema    Myocardial infarction [facility])    status post MI x2 and 3 stents placed in 2003   Obesity    Sleep apnea    does not use CPAP   Stroke ([place]) ~ 2007; ~1987   "weak on right side; messed w/right side of brain; cry all the time"   Type II diabetes mellitus (HCC)    insulin dependent     Surgical History:  Past Surgical History:  Procedure Laterality Date  ABDOMINAL AORTOGRAM W/LOWER EXTREMITY N/A 04/29/2020   Procedure: ABDOMINAL AORTOGRAM W/LOWER EXTREMITY;  Surgeon: Angelia Mould, MD;  Location: [place] [facility];  Service: Cardiovascular;  Laterality: N/A;   AMPUTATION Left 05/02/2020   Procedure: AMPUTATION BELOW KNEE;  Surgeon: Rosetta Posner, MD;  Location: [place];  Service: Vascular;  Laterality: Left;   AV FISTULA PLACEMENT  10/17/2012   Procedure: ARTERIOVENOUS (AV) FISTULA CREATION;  Surgeon: Mal Misty, MD;  Location: [place];  Service: Vascular;  Laterality: Left;   BONE EXOSTOSIS EXCISION Right 06/04/2019   Procedure: EXOSTOSIS EXCISION;  Surgeon: Leanora Cover, MD;  Location: [place];  Service: Orthopedics;  Laterality: Right;   CAPD INSERTION N/A 03/15/2014   Procedure: LAPAROSCOPIC INSERTION CONTINUOUS AMBULATORY PERITONEAL DIALYSIS CATHETER, LAPARASCOPIC INCISIONAL HERNIA  REPAIR  WITH MESH, OMENTOPEXY AND LYSIS OF ADHESIONS;  Surgeon: [name], MD;  Location: [place];  Service: General;  Laterality: N/A;   CORONARY ANGIOPLASTY WITH STENT PLACEMENT  ~ 2002   "3"   CORONARY ANGIOPLASTY WITH STENT PLACEMENT  01/24/2004   successful PCI/stenting RCA  drug eluting cypher stent   CORONARY ANGIOPLASTY WITH STENT PLACEMENT  01/28/2004   successful stentin of a large bifurcation marginal branch of the ramus intermediate vessel   FLEXIBLE SIGMOIDOSCOPY N/A 09/05/2014   Procedure: FLEXIBLE SIGMOIDOSCOPY;  Surgeon: Cleotis Nipper, MD;  Location: [place];  Service: Endoscopy;  Laterality: N/A;   FOREIGN BODY RETRIEVAL N/A 09/19/2021   Procedure: FOREIGN BODY RETRIEVAL;  Surgeon: Adrian Prows, MD;  Location: [place] [facility];  Service: Cardiovascular;  Laterality: N/A;   HERNIA REPAIR     INCISION AND DRAINAGE ABSCESS N/A 09/06/2014   Procedure: REMOVAL OF PD CATH;  Surgeon: Coralie Keens, MD;  Location: [place];  Service: General;  Laterality: N/A;   [place]  03/15/2014   LAPAROSCOPIC LYSIS OF ADHESIONS  03/15/2014   NM MYOCAR PERF WALL MOTION  02/06/2012   normal perfusion scan   PERITONEAL CATHETER INSERTION  03/15/2014   REFRACTIVE SURGERY Bilateral    RIGHT/LEFT HEART CATH AND CORONARY/GRAFT ANGIOGRAPHY N/A 09/19/2021   Procedure: RIGHT/LEFT HEART CATH AND CORONARY/GRAFT ANGIOGRAPHY;  Surgeon: Adrian Prows, MD;  Location: [place] [facility];  Service: Cardiovascular;  Laterality: N/A;   SHUNTOGRAM N/A 03/11/2013   Procedure: Fistulogram;  Surgeon: Conrad Westmont, MD;  Location: [facility] C[facility];  Service: Cardiovascular;  Laterality: N/A;   TENDON TRANSFER Right 06/04/2019   Procedure: RIGHT HAND EXTENSOR TENDON TRANSFER TO SMALL FINGER;   Surgeon: Leanora Cover, MD;  Location: [place];  Service: Orthopedics;  Laterality: Right;   UMBILICAL HERNIA REPAIR       Medications Prior to Admission  Medication Sig Dispense Refill Last Dose   acetaminophen (TYLENOL) 500 MG tablet Take 1,000 mg by mouth every 6 (six) hours as needed for headache (pain).   unknown   albuterol (ACCUNEB) 0.63 MG/3ML nebulizer solution Take 1 ampule by nebulization every 6 (six) hours as needed for wheezing.   unknown   albuterol (PROVENTIL HFA;VENTOLIN HFA) 108 (90 BASE) MCG/ACT inhaler Inhale 2 puffs into the lungs every 6 (six) hours as needed (COPD).   unknown   aspirin 325 MG tablet Take 325 mg by mouth daily.   unknown   atorvastatin (LIPITOR) 80 MG tablet Take 80 mg by mouth daily after supper.   unknown   B Complex Vitamins (VITAMIN B COMPLEX) TABS Take 1 tablet by mouth every Monday, Wednesday, and Friday.  Dialysis days   unknown   cinacalcet (SENSIPAR) 90 MG tablet Take 90 mg by mouth daily.   unknown   epoetin alfa (EPOGEN) 10000 UNIT/ML injection Inject 10,000 Units into the skin every 3 (three) days.   unknown   fluticasone (FLONASE) 50 MCG/ACT nasal spray Place 1 spray into both nostrils daily.   unknown   folic acid (FOLVITE) 540 MCG tablet Take 400 mcg by mouth every Monday, Wednesday, and Friday. Dialysis days   unknown   hydrOXYzine (ATARAX) 25 MG tablet Take 25 mg by mouth every 6 (six) hours as needed for itching (allergies).   unknown   insulin lispro (HUMALOG) 100 UNIT/ML injection Inject 2-12 Units into the skin See admin instructions. Inject 2-12 units subcutaneously three times daily before meals per sliding scale - CBG 201-250 2 units, 251-300 4 units, 301-350 6 units, 351-400 8 units, 401-450 10 units, >450 12 units and call MD   unknown   lactulose (CHRONULAC) 10 GM/15ML solution Take 20 g by mouth daily as needed (constipation).   unknown   lanthanum (FOSRENOL) 500 MG chewable tablet Chew 500 mg by mouth See admin  instructions. Chew one tablet (500 mg) by mouth with the largest meal of the day   unknown   loperamide (IMODIUM) 2 MG capsule Take 2 mg by mouth daily as needed for diarrhea or loose stools.   unknown   metoCLOPramide (REGLAN) 5 MG tablet Take 5 mg by mouth 3 (three) times daily as needed for nausea.   unknown   midodrine (PROAMATINE) 5 MG tablet Take 5 mg by mouth 3 (three) times daily after meals. Hold for SBP >130   unknown   nitroGLYCERIN (NITROSTAT) 0.4 MG SL tablet Place 0.4 mg under the tongue every 5 (five) minutes as needed for chest pain.   unknown   pantoprazole (PROTONIX) 40 MG tablet Take 1 tablet (40 mg total) by mouth daily. (Patient taking differently: Take 40 mg by mouth daily at 6 (six) AM.) 30 tablet 0 09/15/2021 at am   polyethylene glycol (MIRALAX / GLYCOLAX) packet Take 17 g by mouth daily. (Patient taking differently: Take 17 g by mouth daily as needed (constipation). Mix with 6 oz fluid) 14 each 0 unknown   tiotropium (SPIRIVA) 18 MCG inhalation capsule Place 18 mcg into inhaler and inhale daily as needed (COPD).   unknown   vitamin C (ASCORBIC ACID) 500 MG tablet Take 500 mg by mouth daily.   unknown    Inpatient Medications:   [START ON 09/20/2021] aspirin EC  81 mg Oral Daily   atorvastatin  80 mg Oral QPC supper   Chlorhexidine Gluconate Cloth  6 each Topical Q0600   doxercalciferol  3 mcg Intravenous Q T,Th,Sa-HD   midodrine  10 mg Oral TID WC   sodium chloride flush  3 mL Intravenous Q12H    Allergies:  Allergies  Allergen Reactions   Ferumoxytol Itching        Iron Itching         Iodinated Contrast Media Itching   Sildenafil Other (See Comments)    Other reaction(s): Abnormal sexual function    Social History   Socioeconomic History   Marital status: Divorced    Spouse name: Not on file   Number of children: Not on file   Years of education: Not on file   Highest education level: Not on file  Occupational History   Not on file  Tobacco Use    Smoking status: Former  Packs/day: 1.00    Years: 15.00    Pack years: 15.00    Types: Cigarettes    Quit date: 09/24/1986    Years since quitting: 35.0   Smokeless tobacco: Never  Vaping Use   Vaping Use: Never used  Substance and Sexual Activity   Alcohol use: Not Currently    Comment: stopped in 2013   Drug use: No   Sexual activity: Not Currently  Other Topics Concern   Not on file  Social History Narrative   Not on file   Social Determinants of Health   Financial Resource Strain: Not on file  Food Insecurity: Not on file  Transportation Needs: Not on file  Physical Activity: Not on file  Stress: Not on file  Social Connections: Not on file  Intimate Partner Violence: Not on file     Family History  Problem Relation Age of Onset   Asthma Mother    Hyperlipidemia Mother    Hypertension Mother    Stroke Father    Heart attack Father    Prostate cancer Father    Deep vein thrombosis Father    Cancer Father    Diabetes Father    Hyperlipidemia Father    Hypertension Father    Other Father        varicose veins   Heart disease Father        before age 83   Other Sister        varicose veins     Review of Systems: All other systems reviewed and are otherwise negative except as noted above.  Physical Exam: Vitals:   09/19/21 1130 09/19/21 1135 09/19/21 1140 09/19/21 1144  BP: 109/77 120/76    Pulse: (!) 197 (!) 137 (!) 0 (!) 0  Resp: (!) 6 16 (!) 0 (!) 0  Temp:      TempSrc:      SpO2: 92% 98% (!) 0% (!) 0%  Weight:      Height:        GEN- The patient is well appearing, alert and oriented x 3 today.   HEENT: normocephalic, atraumatic; sclera clear, conjunctiva pink; hearing intact; oropharynx clear; neck supple Lungs- Clear to ausculation bilaterally, normal work of breathing.  No wheezes, rales, rhonchi Heart- Regular rate and rhythm, no murmurs, rubs or gallops GI- soft, non-tender, non-distended, bowel sounds present Extremities- no  clubbing, cyanosis, or edema; DP/PT/radial pulses 2+ bilaterally MS- no significant deformity or atrophy Skin- warm and dry, no rash or lesion Psych- euthymic mood, full affect Neuro- strength and sensation are intact  Labs:   Lab Results  Component Value Date   WBC 6.0 09/19/2021   HGB 9.2 (L) 09/19/2021   HCT 28.2 (L) 09/19/2021   MCV 80.6 09/19/2021   PLT 67 (L) 09/19/2021    Recent Labs  Lab 09/16/21 0427 09/16/21 1507 09/19/21 0519  NA 134*   < > 135  K 3.9   < > 4.2  CL 96*   < > 98  CO2 25   < > 25  BUN 13   < > 11  CREATININE 5.01*   < > 4.29*  CALCIUM 7.2*   < > 7.1*  PROT 7.4  --   --   BILITOT 2.0*  --   --   ALKPHOS 142*  --   --   ALT 33  --   --   AST 54*  --   --   GLUCOSE 71   < >  77   < > = values in this interval not displayed.      Radiology/Studies: DG Chest 1 View  Result Date: 09/16/2021 CLINICAL DATA:  Post thoracentesis. EXAM: CHEST  1 VIEW COMPARISON:  09/15/2021 (CT and chest x-ray) FINDINGS: Stable cardiac silhouette.  Large 4 central venous line. Interval decrease in RIGHT pleural effusion. Moderate RIGHT pleural effusion remains. No pneumothorax. LEFT effusion also noted unchanged. IMPRESSION: Mild decrease in RIGHT pleural effusion.  No pneumothorax. Electronically Signed   By: Suzy Bouchard M.D.   On: 09/16/2021 12:43   CARDIAC CATHETERIZATION  Result Date: 09/19/2021   Mid LM to Ost LAD lesion is 100% stenosed.   Ost RCA to Prox RCA lesion is 100% stenosed.   and is small.   LV end diastolic pressure is severely elevated.   Hemodynamic findings consistent with moderate pulmonary hypertension. Procedures performed 01/18/2021: Right and left heart catheterization, graft angiography. Foreign body retrieval, microwire retrieval from the right common femoral artery access. Access closure with Perclose of the right common femoral artery. Please see procedure note regarding loss of microwire used for micropuncture access, there was double entry  through the skin to get arterial access due to large body habitus and large pannus, while I was trying to retrieve the microwire by gently pushing it to the site of entry to the artery, loss of wire position and also wire into the subcutaneous tissue.  Successfully snared with En-Snare system. Hemodynamics: RA 25/34, mean 27 mmHg.  RA saturation 63%. RV 41/15, EDP 25 mmHg. PA 45/28, mean 36 mmHg.  PA saturation 56%.  Aortic saturation 95%. CO 7.52, CI 3.32.  SVR elevated at 1179. LV 100/19, EDP 29 mmHg.  Ao 89/63, mean 76 mmHg.  No pressure gradient across the aortic valve. Angiographic data: LM: Occluded. LAD: Supplied by LIMA to LAD which is widely patent. CX: Supplied by SVG to OM1 which is widely patent. RCA: Occluded in the proximal segment.  Supplied by SVG to RCA.  RCA small. Recommendation: Patient has nonischemic cardiomyopathy.  Could be induced by asynchronous AV pacing and hence may need leadless pacemaker explantation and BiV ICD placement consideration.   PERIPHERAL VASCULAR CATHETERIZATION  Result Date: 09/19/2021   Mid LM to Ost LAD lesion is 100% stenosed.   Ost RCA to Prox RCA lesion is 100% stenosed.   and is small.   LV end diastolic pressure is severely elevated.   Hemodynamic findings consistent with moderate pulmonary hypertension. Procedures performed 01/18/2021: Right and left heart catheterization, graft angiography. Foreign body retrieval, microwire retrieval from the right common femoral artery access. Access closure with Perclose of the right common femoral artery. Please see procedure note regarding loss of microwire used for micropuncture access, there was double entry through the skin to get arterial access due to large body habitus and large pannus, while I was trying to retrieve the microwire by gently pushing it to the site of entry to the artery, loss of wire position and also wire into the subcutaneous tissue.  Successfully snared with En-Snare system. Hemodynamics: RA 25/34,  mean 27 mmHg.  RA saturation 63%. RV 41/15, EDP 25 mmHg. PA 45/28, mean 36 mmHg.  PA saturation 56%.  Aortic saturation 95%. CO 7.52, CI 3.32.  SVR elevated at 1179. LV 100/19, EDP 29 mmHg.  Ao 89/63, mean 76 mmHg.  No pressure gradient across the aortic valve. Angiographic data: LM: Occluded. LAD: Supplied by LIMA to LAD which is widely patent. CX: Supplied by SVG to OM1  which is widely patent. RCA: Occluded in the proximal segment.  Supplied by SVG to RCA.  RCA small. Recommendation: Patient has nonischemic cardiomyopathy.  Could be induced by asynchronous AV pacing and hence may need leadless pacemaker explantation and BiV ICD placement consideration.   DG Chest Port 1 View  Result Date: 09/15/2021 CLINICAL DATA:  Shortness of breath EXAM: PORTABLE CHEST 1 VIEW COMPARISON:  Chest radiograph dated December 03, 2020 FINDINGS: The heart is markedly enlarged. Pulmonary vascular congestion. Moderate bilateral pleural effusions. Dialysis catheter with distal tip in the right atrium. IMPRESSION: Cardiomegaly with pulmonary vascular congestion and moderate bilateral pleural effusions, suggesting volume overload/pulmonary edema. Left access dialysis catheter with distal tip in the right atrium. Electronically Signed   By: Keane Police D.O.   On: 09/15/2021 18:00   ECHOCARDIOGRAM COMPLETE  Result Date: 09/17/2021    ECHOCARDIOGRAM REPORT   Patient Name:   KENNIS WISSMANN Date of Exam: 09/17/2021 Medical Rec #:  [id number]          Height:       71.0 in Accession #:    [id number]         Weight:       247.1 lb Date of Birth:  1956-06-03          BSA:          2.307 m Patient Age:    33 years           BP:           86/67 mmHg Patient Gender: M                  HR:           84 bpm. Exam Location:  Inpatient Procedure: 2D Echo Indications:    dyspnea  History:        Patient has prior history of Echocardiogram examinations, most                 recent 05/02/2020. End stage renal disease; Risk Factors:Diabetes,                  Dyslipidemia, Hypertension and Sleep Apnea.  Sonographer:    [place] Referring Phys: [id number] TAYE T GONFA IMPRESSIONS  1. Left ventricular ejection fraction, by estimation, is <20%. The left ventricle has severely decreased function. The left ventricle demonstrates global hypokinesis with significant septal dyskinesis. There is mild left ventricular hypertrophy. Left ventricular diastolic parameters are indeterminate. Elevated left ventricular end-diastolic pressure.  2. Right ventricular systolic function is moderately reduced. The right ventricular size is moderately enlarged. There is mildly elevated pulmonary artery systolic pressure. The estimated right ventricular systolic pressure is 30.8 mmHg.  3. Left atrial size was mildly dilated.  4. There is a trivial pericardial effusion posterior to the left ventricle. Left pleural effusion present.  5. The mitral valve is abnormal. Mild mitral valve regurgitation. Moderate mitral annular calcification.  6. Tricuspid valve regurgitation is severe.  7. The aortic valve is tricuspid. There is severe calcifcation of the aortic valve. Aortic valve regurgitation is not visualized. Possible severe low gradient aortic valve stenosis versus pseudo-stenosis in the setting of markedly reduced LVEF. Aortic valve mean gradient measures 5.0 mmHg. Dimentionless index 0.31.  8. The inferior vena cava is dilated in size with <50% respiratory variability, suggesting right atrial pressure of 15 mmHg. Comparison(s): Prior images reviewed side by side. LVEF is now markedly reduced with significant septal dyskinesis. Moderate RV dysfunction. Aortic stenosis  possible severe range with low gradient although could be pseudo-stenosis in setting of significantly reduced LVEF. FINDINGS  Left Ventricle: Left ventricular ejection fraction, by estimation, is <20%. The left ventricle has severely decreased function. The left ventricle demonstrates global hypokinesis. The left  ventricular internal cavity size was normal in size. There is mild left ventricular hypertrophy. Left ventricular diastolic parameters are indeterminate. Elevated left ventricular end-diastolic pressure. Right Ventricle: The right ventricular size is moderately enlarged. No increase in right ventricular wall thickness. Right ventricular systolic function is moderately reduced. There is mildly elevated pulmonary artery systolic pressure. The tricuspid regurgitant velocity is 2.32 m/s, and with an assumed right atrial pressure of 15 mmHg, the estimated right ventricular systolic pressure is 16.0 mmHg. Left Atrium: Left atrial size was mildly dilated. Right Atrium: Right atrial size was normal in size. Pericardium: Trivial pericardial effusion is present. The pericardial effusion is posterior to the left ventricle. Mitral Valve: The mitral valve is abnormal. There is mild thickening of the mitral valve leaflet(s). There is mild calcification of the mitral valve leaflet(s). Moderate mitral annular calcification. Mild mitral valve regurgitation. Tricuspid Valve: The tricuspid valve is grossly normal. Tricuspid valve regurgitation is severe. Aortic Valve: The aortic valve is tricuspid. There is severe calcifcation of the aortic valve. There is moderate aortic valve annular calcification. Aortic valve regurgitation is not visualized. Severe aortic stenosis is present. Aortic valve mean gradient measures 5.0 mmHg. Aortic valve peak gradient measures 9.6 mmHg. Aortic valve area, by VTI measures 0.79 cm. Pulmonic Valve: The pulmonic valve was grossly normal. Pulmonic valve regurgitation is trivial. Aorta: The aortic root is normal in size and structure. Venous: The inferior vena cava is dilated in size with less than 50% respiratory variability, suggesting right atrial pressure of 15 mmHg. IAS/Shunts: No atrial level shunt detected by color flow Doppler. Additional Comments: There is pleural effusion in the left lateral  region.  LEFT VENTRICLE PLAX 2D LVIDd:         5.20 cm     Diastology LVIDs:         4.80 cm     LV e' medial: 3.59 cm/s LV PW:         1.10 cm LV IVS:        1.10 cm LVOT diam:     1.80 cm LV SV:         22 LV SV Index:   9 LVOT Area:     2.54 cm  LV Volumes (MOD) LV vol d, MOD A4C: 86.3 ml LV vol s, MOD A4C: 77.5 ml LV SV MOD A4C:     86.3 ml RIGHT VENTRICLE            IVC RV S prime:     3.92 cm/s  IVC diam: 3.70 cm LEFT ATRIUM             Index        RIGHT ATRIUM           Index LA diam:        5.10 cm 2.21 cm/m   RA Area:     18.30 cm LA Vol (A2C):   78.0 ml 33.81 ml/m  RA Volume:   52.20 ml  22.63 ml/m LA Vol (A4C):   80.7 ml 34.98 ml/m LA Biplane Vol: 83.3 ml 36.11 ml/m  AORTIC VALVE AV Area (Vmax):    0.86 cm AV Area (Vmean):   0.79 cm AV Area (VTI):     0.79 cm AV  Vmax:           155.00 cm/s AV Vmean:          105.000 cm/s AV VTI:            0.271 m AV Peak Grad:      9.6 mmHg AV Mean Grad:      5.0 mmHg LVOT Vmax:         52.30 cm/s LVOT Vmean:        32.600 cm/s LVOT VTI:          0.085 m LVOT/AV VTI ratio: 0.31  AORTA Ao Root diam: 2.80 cm TRICUSPID VALVE TR Peak grad:   21.5 mmHg TR Vmax:        232.00 cm/s  SHUNTS Systemic VTI:  0.08 m Systemic Diam: 1.80 cm Rozann Lesches MD Electronically signed by Rozann Lesches MD Signature Date/Time: 09/17/2021/6:04:34 PM    Final    CT CHEST ABDOMEN PELVIS WO CONTRAST  Result Date: 09/15/2021 CLINICAL DATA:  Possible sepsis, shortness of breath and abdominal pain, initial encounter EXAM: CT CHEST, ABDOMEN AND PELVIS WITHOUT CONTRAST TECHNIQUE: Multidetector CT imaging of the chest, abdomen and pelvis was performed following the standard protocol without IV contrast. COMPARISON:  09/13/2021 FINDINGS: CT CHEST FINDINGS Cardiovascular: Heart is not significantly enlarged in size. Heavy coronary calcifications are noted. Mild atherosclerotic calcifications of the thoracic aorta are noted. Changes of prior coronary bypass grafting are seen. Left  jugular dialysis catheter is noted in satisfactory position. Mediastinum/Nodes: Thoracic inlet is within normal limits. The esophagus is within normal limits. No sizable hilar or mediastinal adenopathy is noted. Lungs/Pleura: Large bilateral pleural effusions are noted right slightly greater than left. These of increased in the interval from the prior exam. Lower lobe consolidation is noted stable from the prior study. No sizable parenchymal nodule is seen. Musculoskeletal: Degenerative changes of the thoracic spine are noted. No acute rib abnormality is seen. No compression deformity is noted. Postsurgical changes in the sternum are seen. CT ABDOMEN PELVIS FINDINGS Hepatobiliary: No focal liver abnormality is seen. Gallbladder is decompressed but stable. Pancreas: Unremarkable. No pancreatic ductal dilatation or surrounding inflammatory changes. Spleen: Normal in size without focal abnormality. Adrenals/Urinary Tract: Adrenal glands are within normal limits. Kidneys are well visualized bilaterally. No renal calculi or obstructive changes are noted. The bladder is decompressed. Stomach/Bowel: Diverticular changes noted without evidence of diverticulitis. Previously seen postsurgical changes in the sigmoid are less well appreciated on today's exam due to contrast material throughout the sigmoid. More proximal colon is decompressed. No obstructive or inflammatory changes are seen. The appendix is within normal limits. Vascular/Lymphatic: Aortic atherosclerosis. No enlarged abdominal or pelvic lymph nodes. Reproductive: Prostate is unremarkable. Other: Mild free fluid is noted stable in appearance from the prior exam. Considerable changes of anasarca are noted within the abdominal wall slightly increased when compared with the prior exam. Musculoskeletal: Degenerative changes of lumbar spine are seen. IMPRESSION: CT of the chest: Bilateral pleural effusions right considerably greater than left and increased from the  prior examination from 2 days previous. Associated lower lobe consolidation is noted. CT of the abdomen and pelvis: Diverticulosis without diverticulitis. Free fluid within the abdomen stable from the prior exam. Increasing changes of anasarca in the abdominal wall. Aortic Atherosclerosis (ICD10-I70.0). Electronically Signed   By: Inez Catalina M.D.   On: 09/15/2021 20:33   US THORACENTESIS ASP PLEURAL SPACE W/IMG GUIDE  Result Date: 09/16/2021 INDICATION: Symptomatic right-sided sided pleural effusion. Please perform ultrasound-guided thoracentesis for therapeutic purposes.  EXAM: US THORACENTESIS ASP PLEURAL SPACE W/IMG GUIDE COMPARISON:  Chest radiograph-09/15/2021; chest CT-09/15/2021 MEDICATIONS: None. COMPLICATIONS: None immediate. TECHNIQUE: Informed written consent was obtained from the patient after a discussion of the risks, benefits and alternatives to treatment. A timeout was performed prior to the initiation of the procedure. Initial ultrasound scanning demonstrates a large anechoic right-sided pleural effusion. Sonographic evaluation of the left chest demonstrated a trace left-sided pleural effusion. As such, decision was made to proceed with right-sided thoracentesis. The inferolateral aspect of the right chest was prepped and draped in the usual sterile fashion. 1% lidocaine was used for local anesthesia. An ultrasound image was saved for documentation purposes. An 8 Fr Safe-T-Centesis catheter was introduced. The thoracentesis was performed. The catheter was removed and a dressing was applied. The patient tolerated the procedure well without immediate post procedural complication. The patient was escorted to have an upright chest radiograph. FINDINGS: A total of approximately 1.2 liters of serous fluid was removed. IMPRESSION: Successful ultrasound-guided right sided thoracentesis yielding 1.2 liters of pleural fluid. Electronically Signed   By: Sandi Mariscal M.D.   On: 09/16/2021 13:42     VZD:GLOVF V pacing at 52 with LBBB at 200 ms(personally reviewed)  TELEMETRY: V pacing 80s (personally reviewed)  DEVICE HISTORY: MDT Leadless PPM implanted at [facility] 05/2021  Assessment/Plan: 1.  CHB S/p Leadless PPM at [facility] 05/2021 Now with newly reduced EF in setting of RV pacing  2. Acute systolic CHF Patent grafts -> likely pacing induced Adjust GDMT as tolerated, may be limited by BP Would likely benefit from CRT, though concern he will not have adequate vascular access. With left perm cath, risk of infection is significantly elevated, and potentially prohibitive.  Will need long shared decision making process with patient to determine candidacy for transvenous system.   Dr. Quentin Ore to see. Will then plan follow up as outpatient. With acute CHF would not plan device this admission.  He is a poor candidate overall for device upgrade, and may ultimately be prohibitive.   For questions or updates, please contact [place] Please consult [email or web address] for contact info under Cardiology/STEMI.  Jacalyn Lefevre, PA-C  09/19/2021 12:25 PM

## 2021-09-19 NOTE — Interval H&P Note (Signed)
History and Physical Interval Note:  09/19/2021 10:18 AM  Jonathon Bailey  has presented today for surgery, with the diagnosis of unstable angina.  The various methods of treatment have been discussed with the patient and family. After consideration of risks, benefits and other options for treatment, the patient has consented to  Procedure(s): RIGHT/LEFT HEART CATH AND CORONARY/GRAFT ANGIOGRAPHY (N/A) and possible angioplasty as a surgical intervention.  The patient's history has been reviewed, patient examined, no change in status, stable for surgery.  I have reviewed the patient's chart and labs.  Questions were answered to the patient's satisfaction.   Cath Lab Visit (complete for each Cath Lab visit)  Clinical Evaluation Leading to the Procedure:   ACS: Yes.    Non-ACS:    Anginal Classification: CCS IV  Anti-ischemic medical therapy: Minimal Therapy (1 class of medications)  Non-Invasive Test Results: No non-invasive testing performed  Prior CABG: Previous CABG        Adrian Prows

## 2021-09-19 NOTE — Progress Notes (Signed)
PROGRESS NOTE    Jonathon Bailey  UQJ:[id number] DOB: 1956/09/12 DOA: 09/15/2021 PCP: [facility]   Chief Complain: Shortness of breath  Brief Narrative: Patient is a 65 year old male with history of ESRD on dialysis Monday Wednesday Friday schedule, coronary artery disease status post CABG, stents, systolic CHF, AV block status post pacemaker placement, diabetes type 2, CVA, left BKA, hypertension, hyperlipidemia who was brought to the emergency department from dialysis center with complaints of shortness of breath.  On presentation he was hyportensive.  Chest x-ray showed features of CHF.  CT imaging showed large bilateral pleural effusions, free fluid in the abdomen.  Nephrology consulted for dialysis.  Underwent thoracentesis.  Echo done here showed decreased ejection fraction from prior, cardiology consulted Plan  for right and left heart cath.  Assessment & Plan:   Principal Problem:   Volume overload Active Problems:   Diabetes mellitus type 2 with complications (HCC)   Coronary atherosclerosis   Anemia in chronic kidney disease, on chronic dialysis (HCC)   ESRD on dialysis ([place])   Acute on chronic systolic CHF (congestive heart failure) (HCC)   Hypotension   Lactic acidosis   Thrombocytopenia (HCC)   Acute on chronic combined systolic and diastolic CHF (congestive heart failure) (HCC)   Volume overload/acute on chronic combined systolic/diastolic congestive heart failure: Recently hospitalized for the same at [facility] dialysis here, nephrology following.  Last echo done on 05/2021 showed EF of 50 to 55%, indeterminate diastolic dysfunction but previous echo had shown grade 2 diastolic dysfunction. Chest  imaging was suggestive of vascular congestion, bilateral pleural effusions, abdominal free fluid, anasarca. New echo done here shows EF of less than 20%, global hypokinesis with significant septal dyskinesis, indeterminate left ventricular  diastolic parameters.  We consulted cardiology,plan is for right and left heart cath.  Bilateral pleural effusion: Underwent right-sided thoracentesis with removal of 1.2 L of fluid on 12/24.  Currently on room air  Hypertension: Blood pressure still soft.  On midodrine  Lactic acidosis: Most likely from hypoperfusion in the setting of hypotension,third spacing/volume overload.  Continue monitor.  No indication for antibiotic treatment.  Blood cultures no growth to date.  Insulin-dependent type 2 diabetes: Recent A1c of 7.9%.  Continue monitoring blood sugars. He was hypoglycemic and was started on   D10,now stopped.  Blood sugar on the lower side . Will encourage oral intake  History of coronary artery disease: Status post CABG, stents.  No anginal symptoms.Echo as above.Cardiology consulted  AV block: Status post pacemaker placement on 9/22  Thrombocytopenia: Stable, chronic.  Without any signs of bleeding  Normocytic anemia: Patient with ESRD.  Continue to monitor.  On ESA and IV iron per nephrology  Debility/deconditioning/left BKA: Wheelchair-bound.Pt/OT recommending SNF on dc  Obesity: BMI of 33.4         DVT prophylaxis:SCD Code Status: Full Family Communication:: Discussed with daughter on the phone on 12/27 Patient status:Inpatient  Dispo: The patient is from: Home              Anticipated d/c is to: SNF              Anticipated d/c date is: After cardiology clearance  Consultants: Nephrology  Procedures: Hemodialysis, thoracentesis  Antimicrobials:  Anti-infectives (From admission, onward)    Start     Dose/Rate Route Frequency Ordered Stop   09/15/21 1930  piperacillin-tazobactam (ZOSYN) IVPB 3.375 g        3.375 g 100 mL/hr over 30 Minutes Intravenous  Once  09/15/21 1921 09/15/21 2345       Subjective:  Patient seen and examined the bedside this morning.  Hemodynamically stable, blood pressure was soft but improved from yesterday.  Complains of  weakness.  Complains of shortness of breath while lying down  Objective: Vitals:   09/18/21 2029 09/19/21 0006 09/19/21 0425 09/19/21 0558  BP: 91/73 91/69 (!) 80/58   Pulse: 84  84   Resp:      Temp: (!) 97.5 F (36.4 C)  98 F (36.7 C)   TempSrc:   Axillary   SpO2: 98%  91%   Weight:    105.7 kg  Height:        Intake/Output Summary (Last 24 hours) at 09/19/2021 0727 Last data filed at 09/19/2021 0600 Gross per 24 hour  Intake 343.18 ml  Output 2001 ml  Net -1657.82 ml   Filed Weights   09/18/21 0852 09/18/21 1230 09/19/21 0558  Weight: 108 kg 107.2 kg 105.7 kg    Examination:   General exam: Chronically ill looking, deconditioned, weak,obese HEENT: PERRL Respiratory system:  no wheezes or crackles,diminished air sounds on bases  Cardiovascular system: S1 & S2 heard, RRR.  Dialysis catheter in the chest Gastrointestinal system: Abdomen is nondistended, soft and nontender. Central nervous system: Alert and oriented Extremities: trace lower extremity edema, no clubbing ,no cyanosis, left BKA Skin: No rashes, no ulcers,no icterus      Data Reviewed: I have personally reviewed following labs and imaging studies  CBC: Recent Labs  Lab 09/15/21 1826 09/16/21 0427 09/17/21 0442 09/18/21 0740 09/19/21 0519  WBC 4.7 4.9 5.3 5.6 6.0  NEUTROABS 2.5  --   --  2.9  --   HGB 9.3* 9.6* 8.9* 9.6* 9.2*  HCT 30.6* 30.4* 27.5* 29.6* 28.2*  MCV 83.6 83.5 79.9* 80.0 80.6  PLT 71* 73* 75* 77* 67*   Basic Metabolic Panel: Recent Labs  Lab 09/16/21 0427 09/16/21 1507 09/17/21 0442 09/18/21 0740 09/19/21 0519  NA 134* 135 134* 135 135  K 3.9 4.2 4.2 4.3 4.2  CL 96* 97* 97* 96* 98  CO2 25 25 25 26 25   GLUCOSE 71 107* 70 74 77  BUN 13 16 17 15 11   CREATININE 5.01* 5.44* 5.83* 5.14* 4.29*  CALCIUM 7.2* 7.3* 7.0* 6.9* 7.1*  MG 2.1  --  2.1  --   --   PHOS  --  3.4 3.4 2.9  --    GFR: Estimated Creatinine Clearance: 21.5 mL/min (A) (by C-G formula based on SCr of  4.29 mg/dL (H)). Liver Function Tests: Recent Labs  Lab 09/15/21 1826 09/16/21 0427 09/16/21 1507 09/17/21 0442 09/18/21 0740  AST 49* 54*  --   --   --   ALT 30 33  --   --   --   ALKPHOS 147* 142*  --   --   --   BILITOT 2.1* 2.0*  --   --   --   PROT 7.5 7.4  --   --   --   ALBUMIN 2.7* 2.6* 2.4* 2.2* 2.3*   Recent Labs  Lab 09/15/21 1826  LIPASE 31   No results for input(s): AMMONIA in the last 168 hours. Coagulation Profile: No results for input(s): INR, PROTIME in the last 168 hours. Cardiac Enzymes: No results for input(s): CKTOTAL, CKMB, CKMBINDEX, TROPONINI in the last 168 hours. BNP (last 3 results) No results for input(s): PROBNP in the last 8760 hours. HbA1C: Recent Labs    09/17/21 0442  HGBA1C 5.5   CBG: Recent Labs  Lab 09/16/21 1355 09/18/21 2348 09/19/21 0134 09/19/21 0410 09/19/21 0726  GLUCAP 99 65* 76 80 72   Lipid Profile: No results for input(s): CHOL, HDL, LDLCALC, TRIG, CHOLHDL, LDLDIRECT in the last 72 hours. Thyroid Function Tests: No results for input(s): TSH, T4TOTAL, FREET4, T3FREE, THYROIDAB in the last 72 hours. Anemia Panel: No results for input(s): VITAMINB12, FOLATE, FERRITIN, TIBC, IRON, RETICCTPCT in the last 72 hours. Sepsis Labs: Recent Labs  Lab 09/16/21 0427 09/17/21 0442 09/18/21 0740 09/19/21 0519  LATICACIDVEN 5.2* 4.3* 3.9* 3.6*    Recent Results (from the past 240 hour(s))  Culture, blood (routine x 2)     Status: None (Preliminary result)   Collection Time: 09/15/21  6:28 PM   Specimen: Site Not Specified; Blood  Result Value Ref Range Status   Specimen Description SITE NOT SPECIFIED  Final   Special Requests   Final    BOTTLES DRAWN AEROBIC AND ANAEROBIC Blood Culture results may not be optimal due to an excessive volume of blood received in culture bottles   Culture   Final    NO GROWTH 4 DAYS Performed at [facility], [place] [address] [place], [place] [address]    Report Status PENDING   Incomplete  Culture, blood (routine x 2)     Status: None (Preliminary result)   Collection Time: 09/15/21  6:29 PM   Specimen: Site Not Specified; Blood  Result Value Ref Range Status   Specimen Description SITE NOT SPECIFIED  Final   Special Requests   Final    BOTTLES DRAWN AEROBIC AND ANAEROBIC Blood Culture results may not be optimal due to an excessive volume of blood received in culture bottles   Culture   Final    NO GROWTH 4 DAYS Performed at [facility], [place] [address] [place], [place] [address]    Report Status PENDING  Incomplete  Resp Panel by RT-PCR (Flu A&B, Covid) Nasopharyngeal Swab     Status: None   Collection Time: 09/16/21  2:55 AM   Specimen: Nasopharyngeal Swab; Nasopharyngeal(NP) swabs in vial transport medium  Result Value Ref Range Status   SARS Coronavirus 2 by RT PCR NEGATIVE NEGATIVE Final    Comment: (NOTE) SARS-CoV-2 target nucleic acids are NOT DETECTED.  The SARS-CoV-2 RNA is generally detectable in upper respiratory specimens during the acute phase of infection. The lowest concentration of SARS-CoV-2 viral copies this assay can detect is 138 copies/mL. A negative result does not preclude SARS-Cov-2 infection and should not be used as the sole basis for treatment or other patient management decisions. A negative result may occur with  improper specimen collection/handling, submission of specimen other than nasopharyngeal swab, presence of viral mutation(s) within the areas targeted by this assay, and inadequate number of viral copies(<138 copies/mL). A negative result must be combined with clinical observations, patient history, and epidemiological information. The expected result is Negative.  Fact Sheet for Patients:  EntrepreneurPulse.com.au  Fact Sheet for Healthcare Providers:  IncredibleEmployment.be  This test is no t yet approved or cleared by the [place] FDA and  has been authorized for  detection and/or diagnosis of SARS-CoV-2 by FDA under an Emergency Use Authorization (EUA). This EUA will remain  in effect (meaning this test can be used) for the duration of the COVID-19 declaration under Section 564(b)(1) of the Act, 21 U.S.C.section 360bbb-3(b)(1), unless the authorization is terminated  or revoked sooner.       Influenza  A by PCR NEGATIVE NEGATIVE Final   Influenza B by PCR NEGATIVE NEGATIVE Final    Comment: (NOTE) The Xpert Xpress SARS-CoV-2/FLU/RSV plus assay is intended as an aid in the diagnosis of influenza from Nasopharyngeal swab specimens and should not be used as a sole basis for treatment. Nasal washings and aspirates are unacceptable for Xpert Xpress SARS-CoV-2/FLU/RSV testing.  Fact Sheet for Patients: EntrepreneurPulse.com.au  Fact Sheet for Healthcare Providers: IncredibleEmployment.be  This test is not yet approved or cleared by the [place] FDA and has been authorized for detection and/or diagnosis of SARS-CoV-2 by FDA under an Emergency Use Authorization (EUA). This EUA will remain in effect (meaning this test can be used) for the duration of the COVID-19 declaration under Section 564(b)(1) of the Act, 21 U.S.C. section 360bbb-3(b)(1), unless the authorization is terminated or revoked.  Performed at [facility], [place] [address], [place], [place] [address]          Radiology Studies: ECHOCARDIOGRAM COMPLETE  Result Date: 09/17/2021    ECHOCARDIOGRAM REPORT   Patient Name:   Jonathon Bailey Date of Exam: 09/17/2021 Medical Rec #:  [id number]          Height:       71.0 in Accession #:    [id number]         Weight:       247.1 lb Date of Birth:  [birth year]          BSA:          2.307 m Patient Age:    44 years           BP:           86/67 mmHg Patient Gender: M                  HR:           84 bpm. Exam Location:  Inpatient Procedure: 2D Echo Indications:    dyspnea  History:         Patient has prior history of Echocardiogram examinations, most                 recent 05/02/2020. End stage renal disease; Risk Factors:Diabetes,                 Dyslipidemia, Hypertension and Sleep Apnea.  Sonographer:    [place] Referring Phys: [id number] TAYE T GONFA IMPRESSIONS  1. Left ventricular ejection fraction, by estimation, is <20%. The left ventricle has severely decreased function. The left ventricle demonstrates global hypokinesis with significant septal dyskinesis. There is mild left ventricular hypertrophy. Left ventricular diastolic parameters are indeterminate. Elevated left ventricular end-diastolic pressure.  2. Right ventricular systolic function is moderately reduced. The right ventricular size is moderately enlarged. There is mildly elevated pulmonary artery systolic pressure. The estimated right ventricular systolic pressure is 19.4 mmHg.  3. Left atrial size was mildly dilated.  4. There is a trivial pericardial effusion posterior to the left ventricle. Left pleural effusion present.  5. The mitral valve is abnormal. Mild mitral valve regurgitation. Moderate mitral annular calcification.  6. Tricuspid valve regurgitation is severe.  7. The aortic valve is tricuspid. There is severe calcifcation of the aortic valve. Aortic valve regurgitation is not visualized. Possible severe low gradient aortic valve stenosis versus pseudo-stenosis in the setting of markedly reduced LVEF. Aortic valve mean gradient measures 5.0 mmHg. Dimentionless index 0.31.  8. The inferior vena cava is dilated in size with <50%  respiratory variability, suggesting right atrial pressure of 15 mmHg. Comparison(s): Prior images reviewed side by side. LVEF is now markedly reduced with significant septal dyskinesis. Moderate RV dysfunction. Aortic stenosis possible severe range with low gradient although could be pseudo-stenosis in setting of significantly reduced LVEF. FINDINGS  Left Ventricle: Left ventricular  ejection fraction, by estimation, is <20%. The left ventricle has severely decreased function. The left ventricle demonstrates global hypokinesis. The left ventricular internal cavity size was normal in size. There is mild left ventricular hypertrophy. Left ventricular diastolic parameters are indeterminate. Elevated left ventricular end-diastolic pressure. Right Ventricle: The right ventricular size is moderately enlarged. No increase in right ventricular wall thickness. Right ventricular systolic function is moderately reduced. There is mildly elevated pulmonary artery systolic pressure. The tricuspid regurgitant velocity is 2.32 m/s, and with an assumed right atrial pressure of 15 mmHg, the estimated right ventricular systolic pressure is 06.2 mmHg. Left Atrium: Left atrial size was mildly dilated. Right Atrium: Right atrial size was normal in size. Pericardium: Trivial pericardial effusion is present. The pericardial effusion is posterior to the left ventricle. Mitral Valve: The mitral valve is abnormal. There is mild thickening of the mitral valve leaflet(s). There is mild calcification of the mitral valve leaflet(s). Moderate mitral annular calcification. Mild mitral valve regurgitation. Tricuspid Valve: The tricuspid valve is grossly normal. Tricuspid valve regurgitation is severe. Aortic Valve: The aortic valve is tricuspid. There is severe calcifcation of the aortic valve. There is moderate aortic valve annular calcification. Aortic valve regurgitation is not visualized. Severe aortic stenosis is present. Aortic valve mean gradient measures 5.0 mmHg. Aortic valve peak gradient measures 9.6 mmHg. Aortic valve area, by VTI measures 0.79 cm. Pulmonic Valve: The pulmonic valve was grossly normal. Pulmonic valve regurgitation is trivial. Aorta: The aortic root is normal in size and structure. Venous: The inferior vena cava is dilated in size with less than 50% respiratory variability, suggesting right atrial  pressure of 15 mmHg. IAS/Shunts: No atrial level shunt detected by color flow Doppler. Additional Comments: There is pleural effusion in the left lateral region.  LEFT VENTRICLE PLAX 2D LVIDd:         5.20 cm     Diastology LVIDs:         4.80 cm     LV e' medial: 3.59 cm/s LV PW:         1.10 cm LV IVS:        1.10 cm LVOT diam:     1.80 cm LV SV:         22 LV SV Index:   9 LVOT Area:     2.54 cm  LV Volumes (MOD) LV vol d, MOD A4C: 86.3 ml LV vol s, MOD A4C: 77.5 ml LV SV MOD A4C:     86.3 ml RIGHT VENTRICLE            IVC RV S prime:     3.92 cm/s  IVC diam: 3.70 cm LEFT ATRIUM             Index        RIGHT ATRIUM           Index LA diam:        5.10 cm 2.21 cm/m   RA Area:     18.30 cm LA Vol (A2C):   78.0 ml 33.81 ml/m  RA Volume:   52.20 ml  22.63 ml/m LA Vol (A4C):   80.7 ml 34.98 ml/m LA Biplane Vol: 83.3 ml  36.11 ml/m  AORTIC VALVE AV Area (Vmax):    0.86 cm AV Area (Vmean):   0.79 cm AV Area (VTI):     0.79 cm AV Vmax:           155.00 cm/s AV Vmean:          105.000 cm/s AV VTI:            0.271 m AV Peak Grad:      9.6 mmHg AV Mean Grad:      5.0 mmHg LVOT Vmax:         52.30 cm/s LVOT Vmean:        32.600 cm/s LVOT VTI:          0.085 m LVOT/AV VTI ratio: 0.31  AORTA Ao Root diam: 2.80 cm TRICUSPID VALVE TR Peak grad:   21.5 mmHg TR Vmax:        232.00 cm/s  SHUNTS Systemic VTI:  0.08 m Systemic Diam: 1.80 cm Rozann Lesches MD Electronically signed by Rozann Lesches MD Signature Date/Time: 09/17/2021/6:04:34 PM    Final         Scheduled Meds:  Derrill Memo ON 09/20/2021] aspirin EC  81 mg Oral Daily   atorvastatin  80 mg Oral QPC supper   Chlorhexidine Gluconate Cloth  6 each Topical Q0600   diphenhydrAMINE  25 mg Intravenous Once   doxercalciferol  3 mcg Intravenous Q T,Th,Sa-HD   methylPREDNISolone sodium succinate  125 mg Intravenous Once   midodrine  10 mg Oral TID WC   sodium chloride flush  3 mL Intravenous Q12H   sodium chloride flush  3 mL Intravenous Q12H   Continuous  Infusions:  sodium chloride     sodium chloride 10 mL/hr at 09/19/21 0556     LOS: 3 days    Time spent: 25 mins.More than 50% of that time was spent in counseling and/or coordination of care.      Shelly Coss, MD [facility] P12/27/2022, 7:27 AM

## 2021-09-20 ENCOUNTER — Encounter (HOSPITAL_COMMUNITY): Payer: Self-pay | Admitting: Cardiology

## 2021-09-20 LAB — CBC WITH DIFFERENTIAL/PLATELET
Abs Immature Granulocytes: 0.02 10*3/uL (ref 0.00–0.07)
Basophils Absolute: 0 10*3/uL (ref 0.0–0.1)
Basophils Relative: 0 %
Eosinophils Absolute: 0 10*3/uL (ref 0.0–0.5)
Eosinophils Relative: 0 %
HCT: 29.6 % — ABNORMAL LOW (ref 39.0–52.0)
Hemoglobin: 9.2 g/dL — ABNORMAL LOW (ref 13.0–17.0)
Immature Granulocytes: 0 %
Lymphocytes Relative: 20 %
Lymphs Abs: 1.1 10*3/uL (ref 0.7–4.0)
MCH: 25.8 pg — ABNORMAL LOW (ref 26.0–34.0)
MCHC: 31.1 g/dL (ref 30.0–36.0)
MCV: 83.1 fL (ref 80.0–100.0)
Monocytes Absolute: 0.2 10*3/uL (ref 0.1–1.0)
Monocytes Relative: 4 %
Neutro Abs: 4.3 10*3/uL (ref 1.7–7.7)
Neutrophils Relative %: 76 %
Platelets: 73 10*3/uL — ABNORMAL LOW (ref 150–400)
RBC: 3.56 MIL/uL — ABNORMAL LOW (ref 4.22–5.81)
RDW: 22 % — ABNORMAL HIGH (ref 11.5–15.5)
WBC: 5.6 10*3/uL (ref 4.0–10.5)
nRBC: 0.4 % — ABNORMAL HIGH (ref 0.0–0.2)

## 2021-09-20 LAB — BASIC METABOLIC PANEL
Anion gap: 20 — ABNORMAL HIGH (ref 5–15)
BUN: 18 mg/dL (ref 8–23)
CO2: 16 mmol/L — ABNORMAL LOW (ref 22–32)
Calcium: 7 mg/dL — ABNORMAL LOW (ref 8.9–10.3)
Chloride: 98 mmol/L (ref 98–111)
Creatinine, Ser: 5.39 mg/dL — ABNORMAL HIGH (ref 0.61–1.24)
GFR, Estimated: 11 mL/min — ABNORMAL LOW (ref >60)
Glucose, Bld: 158 mg/dL — ABNORMAL HIGH (ref 70–99)
Potassium: 5 mmol/L (ref 3.5–5.1)
Sodium: 134 mmol/L — ABNORMAL LOW (ref 135–145)

## 2021-09-20 LAB — GLUCOSE, CAPILLARY
Glucose-Capillary: 154 mg/dL — ABNORMAL HIGH (ref 70–99)
Glucose-Capillary: 149 mg/dL — ABNORMAL HIGH (ref 70–99)
Glucose-Capillary: 136 mg/dL — ABNORMAL HIGH (ref 70–99)
Glucose-Capillary: 150 mg/dL — ABNORMAL HIGH (ref 70–99)
Glucose-Capillary: 126 mg/dL — ABNORMAL HIGH (ref 70–99)
Glucose-Capillary: 129 mg/dL — ABNORMAL HIGH (ref 70–99)

## 2021-09-20 LAB — CULTURE, BLOOD (ROUTINE X 2)
Culture: NO GROWTH
Culture: NO GROWTH

## 2021-09-20 MED ORDER — HEPARIN SODIUM (PORCINE) 1000 UNIT/ML IJ SOLN
INTRAMUSCULAR | Status: AC
  Administered 2021-09-20: 1000 [IU]
  Filled 2021-09-20: qty 5

## 2021-09-20 MED ORDER — BACLOFEN 10 MG PO TABS
10.0000 mg | ORAL_TABLET | Freq: Once | ORAL | Status: DC
  Filled 2021-09-20: qty 1

## 2021-09-20 NOTE — Progress Notes (Signed)
Jonathon Bailey Progress Note    Assessment/ Plan:   Hypotension/volume  - CXR 12/24 w/ bilat pleural effusions, vascular congestion. Increased midodrine dose. Repeat CXR w/o edema 12/24 post thoracentesis. 4kg under dry wt, losing body wt. Doubt sig volume overload as this point. Small UF w/ HD today.  HFrEF - echo here showed LVEF < 20%. Per cardiology.  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 HD today.   Anemia  - HGB down to 8.9. ESA ordered for today.   Metabolic bone disease -  Corrected Ca 8.3. PO4 wnl. Continue VDRA, sensipar, binders.   Nutrition - Renal/Carb mod diet Albumin 2.6. Add protein supps.  DMT2 per primary  H/O CABG PAD sp L BKA Dialysis access: [facility] TDC. Refuses avf/avg Debility/ deconditioning/ L BKA - pt is WC bound   [name], MD 09/20/2021, 12:53 PM    OP HD: [facility] MWF   4.5h  F250  400/800   111kg  2/2 bath  P2  TDC RIJ   Heparin 12K -Hectorol 3 mcg IV TIW -Mircera 30 mcg IV q 4 weeks (Last dose11/25/2022)    Subjective:   Seen in room, O x3, no new c/o's   Objective:   BP 94/65 (BP Location: Left Arm)    Pulse 82    Temp 97.9 F (36.6 C) (Oral)    Resp 18    Ht 5\' 11"  (1.803 m)    Wt 107 kg Comment: we got the bed woring again   SpO2 100%    BMI 32.92 kg/m   Intake/Output Summary (Last 24 hours) at 09/20/2021 1253 Last data filed at 09/20/2021 0954 Gross per 24 hour  Intake 457 ml  Output 0 ml  Net 457 ml    Weight change: -0.951 kg  Physical Exam: Gen:nad, very weak and somewhat debilitated CVS:s1s2, rrr Resp:decreased air entry bibasilar, no w/r/r/c, unlabored, bl chest expansion RCV:ELFYB, soft OFB:PZWCH+ RLE edema, L BKA Neuro: awake, alert Dialysis access: [facility]   Labs: BMET Recent Labs  Lab 09/15/21 1826 09/16/21 0427 09/16/21 1507 09/17/21 0442 09/18/21 0740 09/19/21 0519 09/19/21 1026 09/19/21 1029 09/19/21 1902 09/20/21 0316  NA 136 134* 135 134*  135 135 137   137 136  --  134*  K 3.8 3.9 4.2 4.2 4.3 4.2 4.5   4.5 4.5  --  5.0  CL 96* 96* 97* 97* 96* 98  --   --   --  98  CO2 27 25 25 25 26 25   --   --   --  16*  GLUCOSE 65* 71 107* 70 74 77  --   --   --  158*  BUN 10 13 16 17 15 11   --   --   --  18  CREATININE 4.44* 5.01* 5.44* 5.83* 5.14* 4.29*  --   --  4.68* 5.39*  CALCIUM 7.3* 7.2* 7.3* 7.0* 6.9* 7.1*  --   --   --  7.0*  PHOS  --   --  3.4 3.4 2.9  --   --   --   --   --     CBC Recent Labs  Lab 09/15/21 1826 09/16/21 0427 09/18/21 0740 09/19/21 0519 09/19/21 1026 09/19/21 1029 09/19/21 1902 09/20/21 0316  WBC 4.7   < > 5.6 6.0  --   --  5.0 5.6  NEUTROABS 2.5  --  2.9  --   --   --   --  4.3  HGB 9.3*   < > 9.6* 9.2* 11.6*   11.6* 11.6* 9.8* 9.2*  HCT 30.6*   < > 29.6* 28.2* 34.0*   34.0* 34.0* 30.7* 29.6*  MCV 83.6   < > 80.0 80.6  --   --  81.9 83.1  PLT 71*   < > 77* 67*  --   --  75* 73*   < > = values in this interval not displayed.     Medications:     aspirin EC  81 mg Oral Daily   atorvastatin  80 mg Oral QPC supper   Chlorhexidine Gluconate Cloth  6 each Topical Q0600   doxercalciferol  3 mcg Intravenous Q T,Th,Sa-HD   heparin  5,000 Units Subcutaneous Q8H   midodrine  10 mg Oral TID WC   sodium chloride flush  3 mL Intravenous Q12H   sodium chloride flush  3 mL Intravenous Q12H

## 2021-09-20 NOTE — TOC Progression Note (Signed)
Transition of Care [facility]) - Progression Note    Patient Details  Name: Jonathon Bailey MRN: [id number] Date of Birth: Sep 22, 1956  [facility] CM/SW Contact  Reece Agar, [place] Phone Number: 09/20/2021, 12:26 PM  Clinical Narrative:    CSW spoke with Lorenza Chick at [place] who stated that she can take pt back tomorrow if medically ready for DC. Pt would just need a n updated covid test less than 48 hours.   Expected Discharge Plan: Skilled Nursing Facility Barriers to Discharge: Continued Medical Work up  Expected Discharge Plan and Services Expected Discharge Plan: [place] arrangements for the past 2 months: [place]                                       Social Determinants of Health (SDOH) Interventions    Readmission Risk Interventions Readmission Risk Prevention Plan 05/06/2020  Transportation Screening Complete  PCP or Specialist Appt within 3-5 Days Complete  HRI or [place] Complete  Social Work Consult for [place] Planning/Counseling Complete  Palliative Care Screening Not Applicable  Medication Review [employer]) Complete  Some recent data might be hidden

## 2021-09-20 NOTE — Progress Notes (Signed)
Subjective:  Still short of breath Has difficulty laying flat  Objective:  Vital Signs in the last 24 hours: Temp:  [97.4 F (36.3 C)-97.9 F (36.6 C)] 97.9 F (36.6 C) (12/28 1200) Pulse Rate:  [65-90] 82 (12/28 1200) Resp:  [18-20] 18 (12/28 1200) BP: (80-107)/(49-94) 94/65 (12/28 1200) SpO2:  [96 %-100 %] 100 % (12/28 1200) Weight:  [107 kg] 107 kg (12/28 0500)  Intake/Output from previous day: No intake/output data recorded.  Physical Exam Vitals and nursing note reviewed.  Constitutional:      General: He is not in acute distress.    Appearance: He is well-developed. He is ill-appearing.  HENT:     Head: Normocephalic and atraumatic.  Eyes:     Conjunctiva/sclera: Conjunctivae normal.     Pupils: Pupils are equal, round, and reactive to light.  Neck:     Vascular: No JVD.  Cardiovascular:     Rate and Rhythm: Normal rate and regular rhythm.     Pulses: Normal pulses and intact distal pulses.     Heart sounds: Murmur heard.  High-pitched blowing holosystolic murmur is present with a grade of 2/6 at the lower right sternal border and apex.  Pulmonary:     Effort: Pulmonary effort is normal.     Breath sounds: Normal breath sounds. No wheezing or rales.  Abdominal:     General: Bowel sounds are normal.     Palpations: Abdomen is soft.     Tenderness: There is no rebound.  Musculoskeletal:        General: No tenderness. Normal range of motion.     Right lower leg: Edema present.     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  Lymphadenopathy:     Cervical: No cervical adenopathy.  Skin:    General: Skin is warm and dry.  Neurological:     Mental Status: He is alert and oriented to person, place, and time.     Cranial Nerves: No cranial nerve deficit.     Lab Results: BMP Recent Labs    09/18/21 0740 09/19/21 0519 09/19/21 1026 09/19/21 1029 09/19/21 1902 09/20/21 0316  NA 135 135 137   137 136  --  134*  K 4.3 4.2 4.5   4.5 4.5  --   5.0  CL 96* 98  --   --   --  98  CO2 26 25  --   --   --  16*  GLUCOSE 74 77  --   --   --  158*  BUN 15 11  --   --   --  18  CREATININE 5.14* 4.29*  --   --  4.68* 5.39*  CALCIUM 6.9* 7.1*  --   --   --  7.0*  GFRNONAA 12* 15*  --   --  13* 11*    CBC Recent Labs  Lab 09/20/21 0316  WBC 5.6  RBC 3.56*  HGB 9.2*  HCT 29.6*  PLT 73*  MCV 83.1  MCH 25.8*  MCHC 31.1  RDW 22.0*  LYMPHSABS 1.1  MONOABS 0.2  EOSABS 0.0  BASOSABS 0.0    HEMOGLOBIN A1C Lab Results  Component Value Date   HGBA1C 5.5 09/17/2021   MPG 111.15 09/17/2021    Cardiac Panel (last 3 results) No results for input(s): CKTOTAL, CKMB, TROPONINI, RELINDX in the last 8760 hours.  BNP (last 3 results) No results for input(s): BNP in the last 8760 hours.  TSH No results for  input(s): TSH in the last 8760 hours.  Lipid Panel     Component Value Date/Time   CHOL 142 04/29/2020 0428   TRIG 177 (H) 04/29/2020 0428   HDL 28 (L) 04/29/2020 0428   CHOLHDL 5.1 04/29/2020 0428   VLDL 35 04/29/2020 0428   LDLCALC 79 04/29/2020 0428   LDLDIRECT 115 (H) 05/23/2012 1419     Hepatic Function Panel Recent Labs    12/03/20 1048 09/15/21 1826 09/16/21 0427 09/16/21 1507 09/17/21 0442 09/18/21 0740  PROT 7.7 7.5 7.4  --   --   --   ALBUMIN 3.3* 2.7* 2.6* 2.4* 2.2* 2.3*  AST 16 49* 54*  --   --   --   ALT 11 30 33  --   --   --   ALKPHOS 111 147* 142*  --   --   --   BILITOT 1.1 2.1* 2.0*  --   --   --     Cardiac Studies:  RHC/LHC 09/19/2021: Right and left heart catheterization, graft angiography. Foreign body retrieval, microwire retrieval from the right common femoral artery access. Access closure with Perclose of the right common femoral artery.   Please see procedure note regarding loss of microwire used for micropuncture access, there was double entry through the skin to get arterial access due to large body habitus and large pannus, while I was trying to retrieve the microwire by  gently pushing it to the site of entry to the artery, loss of wire position and also wire into the subcutaneous tissue.  Successfully snared with En-Snare system.   Hemodynamics: RA 25/34, mean 27 mmHg.  RA saturation 63%. RV 41/15, EDP 25 mmHg. PA 45/28, mean 36 mmHg.  PA saturation 56%.  Aortic saturation 95%. CO 7.52, CI 3.32.  SVR elevated at 1179. LV 100/19, EDP 29 mmHg.  Ao 89/63, mean 76 mmHg.  No pressure gradient across the aortic valve.   Angiographic data: LM: Occluded. LAD: Supplied by LIMA to LAD which is widely patent. CX: Supplied by SVG to OM1 which is widely patent. RCA: Occluded in the proximal segment.  Supplied by SVG to RCA.  RCA small.  Recommendation: Patient has nonischemic cardiomyopathy.  Could be induced by asynchronous AV pacing and hence may need leadless pacemaker explantation and BiV ICD placement consideration.     EKG 09/15/2021: Sinus rhythm, ventricular pacing.   Echocardiogram 09/17/2021:  1. Left ventricular ejection fraction, by estimation, is <20%. The left  ventricle has severely decreased function. The left ventricle demonstrates  global hypokinesis with significant septal dyskinesis. There is mild left  ventricular hypertrophy. Left ventricular diastolic parameters are indeterminate. Elevated left ventricular end-diastolic pressure.   2. Right ventricular systolic function is moderately reduced. The right  ventricular size is moderately enlarged. There is mildly elevated  pulmonary artery systolic pressure. The estimated right ventricular  systolic pressure is 13.2 mmHg.   3. Left atrial size was mildly dilated.   4. There is a trivial pericardial effusion posterior to the left  ventricle. Left pleural effusion present.   5. The mitral valve is abnormal. Mild mitral valve regurgitation.  Moderate mitral annular calcification.   6. Tricuspid valve regurgitation is severe.   7. The aortic valve is tricuspid. There is severe calcifcation of  the  aortic valve. Aortic valve regurgitation is not visualized. Possible  severe low gradient aortic valve stenosis versus pseudo-stenosis in the  setting of markedly reduced LVEF. Aortic valve mean gradient measures 5.0 mmHg. Dimentionless index 0.31.  8. The inferior vena cava is dilated in size with <50% respiratory  variability, suggesting right atrial pressure of 15 mmHg.    Echocardiogram 06/09/2021: The left ventricular size is normal with normal left ventricular wall  thickness.  Left ventricular systolic function is mildly reduced; LVEF = 40-45%.  Abnormal (paradoxical) septal motion consistent with LBBB.  The right ventricle is moderately dilated. The right ventricular  systolic function is mildly reduced.  There is overall probably moderate calcific aortic stenosis; peak  velocity 2.2 m/s, mean gradient 9 mmHg, calculated AVA ?? Severe functional tricuspid regurgitation due to RV dilation.  Unable to compare to 2015 but by report, the LVEF is lower, the RV is  dilated, and the TR is now severe.    Echocardiogram 04/2020:  1. Left ventricular ejection fraction, by estimation, is 50 to 55%. The  left ventricle has low normal function. The left ventricle has no regional  wall motion abnormalities. Left ventricular diastolic function could not  be evaluated.   2. Right ventricular systolic function is mildly reduced. The right  ventricular size is mildly enlarged. There is normal pulmonary artery  systolic pressure. The estimated right ventricular systolic pressure is  31.5 mmHg.   3. Mild to moderate calcific mitral stenosis is present. The mitral valve  is degenerative. No evidence of mitral valve regurgitation. Mild to  moderate mitral stenosis. The mean mitral valve gradient is 4.5 mmHg with  average heart rate of 72 bpm.   4. Mild to moderate aortic stenosis is present. Vmax 2.3 m/s, MG 12.5  mmHG, AVA 1.06 cm2, DI 0.34. Gradients lower than expected due to low SVi  (21  cc/m2). The aortic valve is tricuspid. Aortic valve regurgitation is  not visualized. Mild to moderate aortic valve stenosis.   5. The inferior vena cava is dilated in size with <50% respiratory  variability, suggesting right atrial pressure of 15 mmHg.    CT abdomen 09/13/2021: Moderate bilateral pleural effusions. Compressive atelectasis in the  lower lobes.   Small to moderate ascites throughout the abdomen and pelvis.   Liver appears small, but no other definite changes of cirrhosis.  Recommend clinical correlation.   Aortic atherosclerosis.   Anasarca like edema throughout the abdominal wall.      Assessment & Recommendations:   65 y/o [place] male with prior hypertension, hyperlipidemia, type 2 DM,  ESRD on HD, CAD s/p prior PCI, MIX3, CABGX3, s/p leadless PPM for high grade AV block (05/2021) at [facility] PAD s/p lt BKA, (04/2020), h/o stroke, anemia, admitted with new acute systolic heart failure   Acute systolic heart failure: Biventricular failure, LVEF <20%.  LVEF was 45-50% in 05/2021 prior to leadless pacemaker placement. Severe global hypokinesis with septal dyskinesis in the setting of RV pacing through leadless pacemaker. Known CAD s/p CABG, no acute symptoms concerning for new ischemia, no new reversible ischemic etiology. 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 Ideally, would benefit from CRT-P. However, thrombocytopenia, challenges with IV access may limit his ability to do this. Also, in acute heart failure. EP will see him for f/u  He remains tenuous with his volume status, ability to remove volume limited due to low blood pressure. Overall, his prognosis remains guarded. Agree with palliative care consult.  On discharge, he can follow up with his primary cardiologist, but can still see EP for subspecialty care for consideration for CRT-P   CAD: Continue aspirin, statin.   PAD: Left BKA in 03/2020 with occluded  left  popliteal artery and dry gangrene. No significant aortoiliac disease seen on angiogram at that time.   Anemia: Mild, stable. Likely combination of blood loss and CKD   Thrombocytopenia: New but stable without new bleeding. Management as per primary team.      Nigel Mormon, MD Pager: [phone] Office: [phone]

## 2021-09-20 NOTE — Plan of Care (Signed)
°  Problem: Education: Goal: Knowledge of General Education information will improve Description: Including pain rating scale, medication(s)/side effects and non-pharmacologic comfort measures Outcome: Progressing   Problem: Health Behavior/Discharge Planning: Goal: Ability to manage health-related needs will improve Outcome: Progressing   Problem: Clinical Measurements: Goal: Respiratory complications will improve Outcome: Not Progressing  Pt continues to be SOB with exertion and often at rest.

## 2021-09-20 NOTE — Progress Notes (Addendum)
PROGRESS NOTE    Jonathon Bailey  WPV:[id number] DOB: 1956/09/12 DOA: 09/15/2021 PCP: [facility]   Chief Complain: Shortness of breath  Brief Narrative: Patient is a 65 year old male with history of ESRD on dialysis Monday Wednesday Friday schedule, coronary artery disease status post CABG, stents, systolic CHF, AV block status post pacemaker placement, diabetes type 2, CVA, left BKA, hypertension, hyperlipidemia who was brought to the emergency department from dialysis center with complaints of shortness of breath.  On presentation he was hyportensive.  Chest x-ray showed features of CHF.  CT imaging showed large bilateral pleural effusions, free fluid in the abdomen.  Nephrology consulted for dialysis.  Underwent thoracentesis.  Echo done here showed decreased ejection fraction from prior, cardiology consulted .  Underwent cardiac cath, found to have nonischemic cardiomyopathy, EP consulted  for biventricular ICD placement which is being planned to be done as an outpatient.PT/OT recommending SNF on discharge  Assessment & Plan:   Principal Problem:   Volume overload Active Problems:   Diabetes mellitus type 2 with complications (HCC)   Coronary atherosclerosis   Anemia in chronic kidney disease, on chronic dialysis (HCC)   ESRD on dialysis ([place])   Acute on chronic systolic CHF (congestive heart failure) (HCC)   Hypotension   Lactic acidosis   Thrombocytopenia (HCC)   Acute on chronic combined systolic and diastolic CHF (congestive heart failure) ([place])   Foreign body accidentally left during a procedure   Volume overload/acute on chronic combined systolic/diastolic congestive heart failure: Recently hospitalized for the same at [facility] dialysis here, nephrology following.  Last echo done on 05/2021 showed EF of 50 to 55%, indeterminate diastolic dysfunction but previous echo had shown grade 2 diastolic dysfunction. Chest  imaging was suggestive of  vascular congestion, bilateral pleural effusions, abdominal free fluid, anasarca. New echo done here shows EF of less than 20%, global hypokinesis with significant septal dyskinesis, indeterminate left ventricular diastolic parameters.  We consulted cardiology,underwent cardiac cath, found to have nonischemic cardiomyopathy, EP consulted  for consideration of  biventricular ICD placement which is being planned to be done as an outpatient  ESRD: Nephrology following for dialysis.  Bilateral pleural effusion: Underwent right-sided thoracentesis with removal of 1.2 L of fluid on 12/24.  Currently on room air  Hypertension: Blood pressure still soft.  On midodrine  Lactic acidosis: Most likely from hypoperfusion in the setting of hypotension,third spacing/volume overload.  Continue monitoring.  No indication for antibiotic treatment.  Blood cultures no growth to date.  Insulin-dependent type 2 diabetes: Recent A1c of 7.9%.  Continue monitoring blood sugars. He was hypoglycemic and was started on   D10,now stopped.  Blood sugar on the lower side . Encouraged  oral intake  History of coronary artery disease: Status post CABG, stents.  No anginal symptoms.Echo as above.Cardiology consulted and following.  Continue aspirin, lipitor  AV block: Status post pacemaker placement on 9/22  Thrombocytopenia: Stable, chronic.  Without any signs of bleeding  Normocytic anemia: Patient with ESRD.  Continue to monitor.  On ESA and IV iron per nephrology  Debility/deconditioning/left BKA: Wheelchair-bound.Pt/OT recommending SNF on dc  Obesity: BMI of 33.4         DVT prophylaxis:SCD Code Status: Full Family Communication:: Discussed with daughter on the phone on 12/27 Patient status:Inpatient  Dispo: The patient is from: Home              Anticipated d/c is to: SNF  Anticipated d/c date is: After cardiology clearance  Consultants: Nephrology  Procedures: Hemodialysis,  thoracentesis  Antimicrobials:  Anti-infectives (From admission, onward)    Start     Dose/Rate Route Frequency Ordered Stop   09/19/21 1122  ceFAZolin (ANCEF) IVPB 2 g/50 mL premix        over 30 Minutes  Continuous PRN 09/19/21 1122 09/19/21 1122   09/15/21 1930  piperacillin-tazobactam (ZOSYN) IVPB 3.375 g        3.375 g 100 mL/hr over 30 Minutes Intravenous  Once 09/15/21 1921 09/15/21 2345       Subjective:  Patient seen and examined at bedside this morning.  Hemodynamically stable.  Blood pressure has slightly improved from yesterday.  He continues to complain of dyspnea while lying on bed.  Not in apparent distress.  Objective: Vitals:   09/20/21 0000 09/20/21 0319 09/20/21 0400 09/20/21 0500  BP: 96/76 102/78 (!) 105/94   Pulse:  90 89   Resp:   20   Temp:      TempSrc:      SpO2:  100% 100%   Weight:    107 kg  Height:        Intake/Output Summary (Last 24 hours) at 09/20/2021 0727 Last data filed at 09/20/2021 0022 Gross per 24 hour  Intake --  Output 0 ml  Net 0 ml   Filed Weights   09/18/21 1230 09/19/21 0558 09/20/21 0500  Weight: 107.2 kg 105.7 kg 107 kg    Examination:  General exam: Chronically ill looking, deconditioned, obese Respiratory system: Diminished air sounds on the right side, no wheezes or crackles  Cardiovascular system: S1 & S2 heard, RRR.  Dialysis catheter on the chest Gastrointestinal system: Abdomen is nondistended, soft and nontender. Central nervous system: Alert and oriented Extremities: trace lower extremity edema, no clubbing ,no cyanosis Skin: No rashes, no ulcers,no icterus     Data Reviewed: I have personally reviewed following labs and imaging studies  CBC: Recent Labs  Lab 09/15/21 1826 09/16/21 0427 09/17/21 0442 09/18/21 0740 09/19/21 0519 09/19/21 1026 09/19/21 1029 09/19/21 1902 09/20/21 0316  WBC 4.7   < > 5.3 5.6 6.0  --   --  5.0 5.6  NEUTROABS 2.5  --   --  2.9  --   --   --   --  4.3  HGB 9.3*    < > 8.9* 9.6* 9.2* 11.6*   11.6* 11.6* 9.8* 9.2*  HCT 30.6*   < > 27.5* 29.6* 28.2* 34.0*   34.0* 34.0* 30.7* 29.6*  MCV 83.6   < > 79.9* 80.0 80.6  --   --  81.9 83.1  PLT 71*   < > 75* 77* 67*  --   --  75* 73*   < > = values in this interval not displayed.   Basic Metabolic Panel: Recent Labs  Lab 09/16/21 0427 09/16/21 1507 09/17/21 0442 09/18/21 0740 09/19/21 0519 09/19/21 1026 09/19/21 1029 09/19/21 1902 09/20/21 0316  NA 134* 135 134* 135 135 137   137 136  --  134*  K 3.9 4.2 4.2 4.3 4.2 4.5   4.5 4.5  --  5.0  CL 96* 97* 97* 96* 98  --   --   --  98  CO2 25 25 25 26 25   --   --   --  16*  GLUCOSE 71 107* 70 74 77  --   --   --  158*  BUN 13 16 17 15 11   --   --   --  18  CREATININE 5.01* 5.44* 5.83* 5.14* 4.29*  --   --  4.68* 5.39*  CALCIUM 7.2* 7.3* 7.0* 6.9* 7.1*  --   --   --  7.0*  MG 2.1  --  2.1  --   --   --   --   --   --   PHOS  --  3.4 3.4 2.9  --   --   --   --   --    GFR: Estimated Creatinine Clearance: 17.2 mL/min (A) (by C-G formula based on SCr of 5.39 mg/dL (H)). Liver Function Tests: Recent Labs  Lab 09/15/21 1826 09/16/21 0427 09/16/21 1507 09/17/21 0442 09/18/21 0740  AST 49* 54*  --   --   --   ALT 30 33  --   --   --   ALKPHOS 147* 142*  --   --   --   BILITOT 2.1* 2.0*  --   --   --   PROT 7.5 7.4  --   --   --   ALBUMIN 2.7* 2.6* 2.4* 2.2* 2.3*   Recent Labs  Lab 09/15/21 1826  LIPASE 31   No results for input(s): AMMONIA in the last 168 hours. Coagulation Profile: No results for input(s): INR, PROTIME in the last 168 hours. Cardiac Enzymes: No results for input(s): CKTOTAL, CKMB, CKMBINDEX, TROPONINI in the last 168 hours. BNP (last 3 results) No results for input(s): PROBNP in the last 8760 hours. HbA1C: No results for input(s): HGBA1C in the last 72 hours.  CBG: Recent Labs  Lab 09/19/21 1557 09/19/21 2005 09/19/21 2357 09/20/21 0410 09/20/21 0723  GLUCAP 101* 133* 154* 149* 136*   Lipid Profile: No results  for input(s): CHOL, HDL, LDLCALC, TRIG, CHOLHDL, LDLDIRECT in the last 72 hours. Thyroid Function Tests: No results for input(s): TSH, T4TOTAL, FREET4, T3FREE, THYROIDAB in the last 72 hours. Anemia Panel: No results for input(s): VITAMINB12, FOLATE, FERRITIN, TIBC, IRON, RETICCTPCT in the last 72 hours. Sepsis Labs: Recent Labs  Lab 09/16/21 0427 09/17/21 0442 09/18/21 0740 09/19/21 0519  LATICACIDVEN 5.2* 4.3* 3.9* 3.6*    Recent Results (from the past 240 hour(s))  Culture, blood (routine x 2)     Status: None   Collection Time: 09/15/21  6:28 PM   Specimen: Site Not Specified; Blood  Result Value Ref Range Status   Specimen Description SITE NOT SPECIFIED  Final   Special Requests   Final    BOTTLES DRAWN AEROBIC AND ANAEROBIC Blood Culture results may not be optimal due to an excessive volume of blood received in culture bottles   Culture   Final    NO GROWTH 5 DAYS Performed at [facility], [place] [address], [place], [place] [address]    Report Status 09/20/2021 FINAL  Final  Culture, blood (routine x 2)     Status: None   Collection Time: 09/15/21  6:29 PM   Specimen: Site Not Specified; Blood  Result Value Ref Range Status   Specimen Description SITE NOT SPECIFIED  Final   Special Requests   Final    BOTTLES DRAWN AEROBIC AND ANAEROBIC Blood Culture results may not be optimal due to an excessive volume of blood received in culture bottles   Culture   Final    NO GROWTH 5 DAYS Performed at [facility], [place] [address], [place], [place] [address]    Report Status 09/20/2021 FINAL  Final  Resp Panel by RT-PCR (Flu A&B, Covid)  Nasopharyngeal Swab     Status: None   Collection Time: 09/16/21  2:55 AM   Specimen: Nasopharyngeal Swab; Nasopharyngeal(NP) swabs in vial transport medium  Result Value Ref Range Status   SARS Coronavirus 2 by RT PCR NEGATIVE NEGATIVE Final    Comment: (NOTE) SARS-CoV-2 target nucleic acids are NOT DETECTED.  The SARS-CoV-2 RNA is  generally detectable in upper respiratory specimens during the acute phase of infection. The lowest concentration of SARS-CoV-2 viral copies this assay can detect is 138 copies/mL. A negative result does not preclude SARS-Cov-2 infection and should not be used as the sole basis for treatment or other patient management decisions. A negative result may occur with  improper specimen collection/handling, submission of specimen other than nasopharyngeal swab, presence of viral mutation(s) within the areas targeted by this assay, and inadequate number of viral copies(<138 copies/mL). A negative result must be combined with clinical observations, patient history, and epidemiological information. The expected result is Negative.  Fact Sheet for Patients:  EntrepreneurPulse.com.au  Fact Sheet for Healthcare Providers:  IncredibleEmployment.be  This test is no t yet approved or cleared by the [place] FDA and  has been authorized for detection and/or diagnosis of SARS-CoV-2 by FDA under an Emergency Use Authorization (EUA). This EUA will remain  in effect (meaning this test can be used) for the duration of the COVID-19 declaration under Section 564(b)(1) of the Act, 21 U.S.C.section 360bbb-3(b)(1), unless the authorization is terminated  or revoked sooner.       Influenza A by PCR NEGATIVE NEGATIVE Final   Influenza B by PCR NEGATIVE NEGATIVE Final    Comment: (NOTE) The Xpert Xpress SARS-CoV-2/FLU/RSV plus assay is intended as an aid in the diagnosis of influenza from Nasopharyngeal swab specimens and should not be used as a sole basis for treatment. Nasal washings and aspirates are unacceptable for Xpert Xpress SARS-CoV-2/FLU/RSV testing.  Fact Sheet for Patients: EntrepreneurPulse.com.au  Fact Sheet for Healthcare Providers: IncredibleEmployment.be  This test is not yet approved or cleared by the [place] FDA and has been authorized for detection and/or diagnosis of SARS-CoV-2 by FDA under an Emergency Use Authorization (EUA). This EUA will remain in effect (meaning this test can be used) for the duration of the COVID-19 declaration under Section 564(b)(1) of the Act, 21 U.S.C. section 360bbb-3(b)(1), unless the authorization is terminated or revoked.  Performed at [facility], [place] [address], [place],  [address]          Radiology Studies: CARDIAC CATHETERIZATION  Result Date: 09/19/2021   Mid LM to [facility] LAD lesion is 100% stenosed.   Ost RCA to Prox RCA lesion is 100% stenosed.   and is small.   LV end diastolic pressure is severely elevated.   Hemodynamic findings consistent with moderate pulmonary hypertension. Procedures performed 01/18/2021: Right and left heart catheterization, graft angiography. Foreign body retrieval, microwire retrieval from the right common femoral artery access. Access closure with Perclose of the right common femoral artery. Please see procedure note regarding loss of microwire used for micropuncture access, there was double entry through the skin to get arterial access due to large body habitus and large pannus, while I was trying to retrieve the microwire by gently pushing it to the site of entry to the artery, loss of wire position and also wire into the subcutaneous tissue.  Successfully snared with En-Snare system. Hemodynamics: RA 25/34, mean 27 mmHg.  RA saturation 63%. RV 41/15, EDP 25 mmHg. PA 45/28, mean 36 mmHg.  PA saturation 56%.  Aortic saturation 95%. CO 7.52, CI 3.32.  SVR elevated at 1179. LV 100/19, EDP 29 mmHg.  Ao 89/63, mean 76 mmHg.  No pressure gradient across the aortic valve. Angiographic data: LM: Occluded. LAD: Supplied by LIMA to LAD which is widely patent. CX: Supplied by SVG to OM1 which is widely patent. RCA: Occluded in the proximal segment.  Supplied by SVG to RCA.  RCA small. Recommendation: Patient has nonischemic  cardiomyopathy.  Could be induced by asynchronous AV pacing and hence may need leadless pacemaker explantation and BiV ICD placement consideration.   PERIPHERAL VASCULAR CATHETERIZATION  Result Date: 09/19/2021   Mid LM to Ost LAD lesion is 100% stenosed.   Ost RCA to Prox RCA lesion is 100% stenosed.   and is small.   LV end diastolic pressure is severely elevated.   Hemodynamic findings consistent with moderate pulmonary hypertension. Procedures performed 01/18/2021: Right and left heart catheterization, graft angiography. Foreign body retrieval, microwire retrieval from the right common femoral artery access. Access closure with Perclose of the right common femoral artery. Please see procedure note regarding loss of microwire used for micropuncture access, there was double entry through the skin to get arterial access due to large body habitus and large pannus, while I was trying to retrieve the microwire by gently pushing it to the site of entry to the artery, loss of wire position and also wire into the subcutaneous tissue.  Successfully snared with En-Snare system. Hemodynamics: RA 25/34, mean 27 mmHg.  RA saturation 63%. RV 41/15, EDP 25 mmHg. PA 45/28, mean 36 mmHg.  PA saturation 56%.  Aortic saturation 95%. CO 7.52, CI 3.32.  SVR elevated at 1179. LV 100/19, EDP 29 mmHg.  Ao 89/63, mean 76 mmHg.  No pressure gradient across the aortic valve. Angiographic data: LM: Occluded. LAD: Supplied by LIMA to LAD which is widely patent. CX: Supplied by SVG to OM1 which is widely patent. RCA: Occluded in the proximal segment.  Supplied by SVG to RCA.  RCA small. Recommendation: Patient has nonischemic cardiomyopathy.  Could be induced by asynchronous AV pacing and hence may need leadless pacemaker explantation and BiV ICD placement consideration.        Scheduled Meds:  aspirin EC  81 mg Oral Daily   atorvastatin  80 mg Oral QPC supper   Chlorhexidine Gluconate Cloth  6 each Topical Q0600    doxercalciferol  3 mcg Intravenous Q T,Th,Sa-HD   heparin  5,000 Units Subcutaneous Q8H   midodrine  10 mg Oral TID WC   sodium chloride flush  3 mL Intravenous Q12H   sodium chloride flush  3 mL Intravenous Q12H   Continuous Infusions:  sodium chloride       LOS: 4 days    Time spent: 25 mins.More than 50% of that time was spent in counseling and/or coordination of care.      Shelly Coss, MD [facility] P12/28/2022, 7:27 AM

## 2021-09-20 NOTE — Progress Notes (Signed)
Physical Therapy Treatment Patient Details Name: Jonathon Bailey MRN: [id number] DOB: December 11, 1955 Today's Date: 09/20/2021   History of Present Illness Pt is a 65 y.o. male who presented 09/15/21 with SOB following his HD treatment session. Pt also with R leg edema. Pt with volume overload, acute on chronic systolic CHF with bilateral pleural effusions. PMH: ESRD on MWF HD, CAD s/p CABG, chronic systolic CHF (EF 22-63% 3/35/4562), high-grade AV block s/p leadless PPM 06/09/2021, T2DM, HTN, HLD, history of CVA, s/p left BKA, anemia of chronic kidney disease    PT Comments    Pt continues to display significant gross weakness, limiting him from being able to come to stand despite multiple attempts even relying on his upper extremities to pull him to stand in stedy from elevated EOB x4 attempts with maxAx2. Pt also with poor safety awareness, wanting to sit on EOB at end of session even though he reported almost sliding out of bed earlier. Pt educated it would be unsafe to leave him sitting EOB thus returned him to supine. Will plan for lift out of bed to chair in future sessions. Will continue to follow acutely. Current recommendations remain appropriate.    Recommendations for follow up therapy are one component of a multi-disciplinary discharge planning process, led by the attending physician.  Recommendations may be updated based on patient status, additional functional criteria and insurance authorization.  Follow Up Recommendations  Skilled nursing-short term rehab (<3 hours/day)     Assistance Recommended at Discharge Frequent or constant Supervision/Assistance  Equipment Recommendations  Other (comment) (defer to next venue of care)    Recommendations for Other Services       Precautions / Restrictions Precautions Precautions: Fall;Other (comment) Precaution Comments: prior L BKA Restrictions Weight Bearing Restrictions: No     Mobility  Bed Mobility Overal bed mobility:  Needs Assistance Bed Mobility: Supine to Sit;Sit to Supine     Supine to sit: Mod assist;+2 for safety/equipment;HOB elevated Sit to supine: Mod assist;+2 for safety/equipment;HOB elevated   General bed mobility comments: Pt requiring modA to ascend trunk and pivot hips to EOB, needing extra time to rearrange scrotum to reduce pain. ModA to manage legs and trunk back to midline supine in bed.    Transfers Overall transfer level: Needs assistance Equipment used: Ambulation equipment used Transfers: Sit to/from Stand Sit to Stand: Max assist;+2 physical assistance;+2 safety/equipment;From elevated surface           General transfer comment: Attempted to come to stand from elevated EOB to stedy 4x with maxAx2, using bed pad to facilitate hip extension, but pt unable to clear buttocks more than a couple inches, maintaining a hip/trunk flexion posture despite cues.    Ambulation/Gait               General Gait Details: Unable   Stairs             Wheelchair Mobility    Modified Rankin (Stroke Patients Only)       Balance Overall balance assessment: Needs assistance Sitting-balance support: Single extremity supported;Bilateral upper extremity supported;Feet supported Sitting balance-Leahy Scale: Poor Sitting balance - Comments: Reliant on at least 1 UE support to sit statically EOB.   Standing balance support: Bilateral upper extremity supported Standing balance-Leahy Scale: Zero Standing balance comment: MaxAx2 and bil UE support to stand 4x, but unable to extend hips to stand upright either attempt.  Cognition Arousal/Alertness: Awake/alert Behavior During Therapy: WFL for tasks assessed/performed Overall Cognitive Status: No family/caregiver present to determine baseline cognitive functioning                                 General Comments: Pt with poor safety awareness, requesting to remain seated on  EOB even though he reported he almost slid out of bed before. Educated pt it would be unsafe to leave him sitting on EOB due to risk for falls, thus left pt supine in bed.        Exercises      General Comments        Pertinent Vitals/Pain Pain Assessment: Faces Faces Pain Scale: Hurts little more Pain Location: buttocks, posterior thighs at wounds, scrotum Pain Descriptors / Indicators: Discomfort;Grimacing;Moaning;Guarding Pain Intervention(s): Limited activity within patient's tolerance;Monitored during session;Repositioned (placed towel under scrotum to assist with keeping it from getting stuck under thighs with mobility)    Home Living                          Prior Function            PT Goals (current goals can now be found in the care plan section) Acute Rehab PT Goals Patient Stated Goal: to get better PT Goal Formulation: With patient Time For Goal Achievement: 09/30/21 Potential to Achieve Goals: Good Progress towards PT goals: Progressing toward goals    Frequency    Min 2X/week      PT Plan Current plan remains appropriate    Co-evaluation              AM-PAC PT "6 Clicks" Mobility   Outcome Measure  Help needed turning from your back to your side while in a flat bed without using bedrails?: A Little Help needed moving from lying on your back to sitting on the side of a flat bed without using bedrails?: A Lot Help needed moving to and from a bed to a chair (including a wheelchair)?: Total Help needed standing up from a chair using your arms (e.g., wheelchair or bedside chair)?: Total Help needed to walk in hospital room?: Total Help needed climbing 3-5 steps with a railing? : Total 6 Click Score: 9    End of Session Equipment Utilized During Treatment: Gait belt Activity Tolerance: Patient tolerated treatment well Patient left: in bed;with call bell/phone within reach Nurse Communication: Mobility status PT Visit Diagnosis:  Unsteadiness on feet (R26.81);Muscle weakness (generalized) (M62.81);Difficulty in walking, not elsewhere classified (R26.2)     Time: 8466-5993 PT Time Calculation (min) (ACUTE ONLY): 29 min  Charges:  $Therapeutic Activity: 23-37 mins                     Moishe Spice, PT, DPT Acute Rehabilitation Services  Pager: [phone] Office: [place] 09/20/2021, 10:12 AM

## 2021-09-21 DIAGNOSIS — Z7189 Other specified counseling: Secondary | ICD-10-CM

## 2021-09-21 DIAGNOSIS — Z515 Encounter for palliative care: Secondary | ICD-10-CM

## 2021-09-21 DIAGNOSIS — R531 Weakness: Secondary | ICD-10-CM

## 2021-09-21 LAB — GLUCOSE, CAPILLARY
Glucose-Capillary: 128 mg/dL — ABNORMAL HIGH (ref 70–99)
Glucose-Capillary: 133 mg/dL — ABNORMAL HIGH (ref 70–99)
Glucose-Capillary: 133 mg/dL — ABNORMAL HIGH (ref 70–99)
Glucose-Capillary: 149 mg/dL — ABNORMAL HIGH (ref 70–99)
Glucose-Capillary: 159 mg/dL — ABNORMAL HIGH (ref 70–99)
Glucose-Capillary: 169 mg/dL — ABNORMAL HIGH (ref 70–99)

## 2021-09-21 LAB — CBC
HCT: 26.8 % — ABNORMAL LOW (ref 39.0–52.0)
Hemoglobin: 8.7 g/dL — ABNORMAL LOW (ref 13.0–17.0)
MCH: 26.4 pg (ref 26.0–34.0)
MCHC: 32.5 g/dL (ref 30.0–36.0)
MCV: 81.2 fL (ref 80.0–100.0)
Platelets: 70 10*3/uL — ABNORMAL LOW (ref 150–400)
RBC: 3.3 MIL/uL — ABNORMAL LOW (ref 4.22–5.81)
RDW: 21.5 % — ABNORMAL HIGH (ref 11.5–15.5)
WBC: 9.4 10*3/uL (ref 4.0–10.5)
nRBC: 0.6 % — ABNORMAL HIGH (ref 0.0–0.2)

## 2021-09-21 LAB — BASIC METABOLIC PANEL
Anion gap: 17 — ABNORMAL HIGH (ref 5–15)
BUN: 18 mg/dL (ref 8–23)
CO2: 20 mmol/L — ABNORMAL LOW (ref 22–32)
Calcium: 6.9 mg/dL — ABNORMAL LOW (ref 8.9–10.3)
Chloride: 93 mmol/L — ABNORMAL LOW (ref 98–111)
Creatinine, Ser: 4.22 mg/dL — ABNORMAL HIGH (ref 0.61–1.24)
GFR, Estimated: 15 mL/min — ABNORMAL LOW (ref >60)
Glucose, Bld: 126 mg/dL — ABNORMAL HIGH (ref 70–99)
Potassium: 4.4 mmol/L (ref 3.5–5.1)
Sodium: 130 mmol/L — ABNORMAL LOW (ref 135–145)

## 2021-09-21 LAB — LACTIC ACID, PLASMA: Lactic Acid, Venous: 7.5 mmol/L (ref 0.5–1.9)

## 2021-09-21 MED ORDER — DOXERCALCIFEROL 4 MCG/2ML IV SOLN
3.0000 ug | INTRAVENOUS | Status: DC
  Administered 2021-09-22: 3 ug via INTRAVENOUS
  Filled 2021-09-21 (×2): qty 2

## 2021-09-21 MED ORDER — INSULIN ASPART 100 UNIT/ML IJ SOLN
0.0000 [IU] | Freq: Three times a day (TID) | INTRAMUSCULAR | Status: DC
  Administered 2021-09-21: 17:00:00 1 [IU] via SUBCUTANEOUS

## 2021-09-21 NOTE — Plan of Care (Signed)
  Problem: Clinical Measurements: Goal: Diagnostic test results will improve Outcome: Progressing Goal: Respiratory complications will improve Outcome: Progressing   Problem: Activity: Goal: Risk for activity intolerance will decrease Outcome: Progressing   

## 2021-09-21 NOTE — Progress Notes (Signed)
Occupational Therapy Treatment Patient Details Name: Jonathon Bailey MRN: [id number] DOB: 07-31-1956 Today's Date: 09/21/2021   History of present illness Pt is a 65 y.o. male who presented 09/15/21 with SOB following his HD treatment session. Pt also with R leg edema. Pt with volume overload, acute on chronic systolic CHF with bilateral pleural effusions. 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 Patient progressed from bed level to recliner via maximove. Able to roll R<>L in supine with Min-Mod A and increased time/effort. Vitals WFL throughout on 2L O2 via [place]. Patient continues to be limited by deficits listed below and would benefit from continued acute OT services in prep for safe d/c to next level of care.    Recommendations for follow up therapy are one component of a multi-disciplinary discharge planning process, led by the attending physician.  Recommendations may be updated based on patient status, additional functional criteria and insurance authorization.    Follow Up Recommendations  Long-term institutional care without follow-up therapy    Assistance Recommended at Discharge Frequent or constant Supervision/Assistance  Equipment Recommendations  None recommended by OT    Recommendations for Other Services      Precautions / Restrictions Precautions Precautions: Fall;Other (comment) Precaution Comments: prior L BKA Restrictions Weight Bearing Restrictions: No       Mobility Bed Mobility Overal bed mobility: Needs Assistance Bed Mobility: Supine to Sit;Sit to Supine       Sit to supine: Mod assist   General bed mobility comments: Able to control descent of trunk but required  assist to advance RLE from EOB to bed surface. Assist to bring trunk midline once in supine.    Transfers Overall transfer level: Needs assistance Equipment used: Ambulation equipment used               General transfer comment: Maximove to recliner     Balance Overall balance assessment: Needs assistance Sitting-balance support: Single extremity supported;Bilateral upper extremity supported;Feet supported Sitting balance-Leahy Scale: Fair Sitting balance - Comments: Reliant on at least 1 UE support to sit statically EOB during breakfast meal.                                   ADL either performed or assessed with clinical judgement   ADL Overall ADL's : Needs assistance/impaired Eating/Feeding: Independent   Grooming: Wash/dry hands;Wash/dry face;Oral care;Brushing hair;Set up;Sitting                   Toilet Transfer: Total assistance Toilet Transfer Details (indicate cue type and reason): Total A to position bedpan in supine; unable to complete BM.                Extremity/Trunk Assessment              Vision       Perception     Praxis      Cognition Arousal/Alertness: Awake/alert Behavior During Therapy: WFL for tasks assessed/performed Overall Cognitive Status: No family/caregiver present to determine baseline cognitive functioning  General Comments: Patient follows 1-step verbal commands with good accuracy; restless          Exercises     Shoulder Instructions       General Comments SpO2 99% on 2L O2 via [place]. HR 50-70's throughout.    Pertinent Vitals/ Pain       Pain Assessment: Faces Faces Pain Scale: Hurts little more Pain Location: buttocks, posterior thighs at wounds, scrotum Pain Descriptors / Indicators: Discomfort;Grimacing;Moaning;Guarding Pain Intervention(s): Limited activity within patient's tolerance;Monitored during session;Repositioned  Home Living                                           Prior Functioning/Environment              Frequency  Min 2X/week        Progress Toward Goals  OT Goals(current goals can now be found in the care plan section)  Progress towards OT goals: Progressing toward goals  Acute Rehab OT Goals Patient Stated Goal: No goals stated. OT Goal Formulation: With patient Time For Goal Achievement: 10-21-21 Potential to Achieve Goals: Good ADL Goals Pt Will Perform Lower Body Bathing: with mod assist;sit to/from stand;with adaptive equipment Pt Will Perform Lower Body Dressing: with mod assist;sit to/from stand;with adaptive equipment Pt Will Transfer to Toilet: with mod assist;squat pivot transfer;stand pivot transfer;bedside commode Pt Will Perform Toileting - Clothing Manipulation and hygiene: with mod assist;sitting/lateral leans;sit to/from stand  Plan Discharge plan remains appropriate;Frequency remains appropriate    Co-evaluation                 AM-PAC OT "6 Clicks" Daily Activity     Outcome Measure   Help from another person eating meals?: None Help from another person taking care of personal grooming?: A Little Help from another person toileting, which includes using toliet, bedpan, or urinal?: Total Help from another person bathing (including washing, rinsing, drying)?: A Lot Help from another person to put on and taking off regular upper body clothing?: A Little Help from another person to put on and taking off regular lower body clothing?: Total 6 Click Score: 14    End of Session Equipment Utilized During Treatment: Other (comment) (Maximove)  OT Visit Diagnosis: Unsteadiness on feet (R26.81)   Activity Tolerance Patient tolerated treatment well   Patient Left in chair;with call bell/phone within reach;with chair alarm set   Nurse Communication Mobility status        Time: 0071-2197 OT Time Calculation (min): 38 min  Charges: OT General  Charges $OT Visit: 1 Visit OT Treatments $Self Care/Home Management : 8-22 mins $Therapeutic Activity: 23-37 mins  Bana Borgmeyer H. OTR/L Supplemental OT, Department of rehab services [phone]  Donnis Phaneuf R H. 09/21/2021, 8:54 AM

## 2021-09-21 NOTE — Progress Notes (Signed)
Pts. Lactic acid level 7.5. On call for [facility] paged to make aware.

## 2021-09-21 NOTE — Progress Notes (Signed)
PROGRESS NOTE    Jonathon Bailey  GMW:[id number] DOB: Sep 28, 1955 DOA: 09/15/2021 PCP: [facility]   Chief Complain: Shortness of breath  Brief Narrative: Patient is a 65 year old male with history of ESRD on dialysis Monday Wednesday Friday schedule, coronary artery disease status post CABG, stents, systolic CHF, AV block status post pacemaker placement, diabetes type 2, CVA, left BKA, hypertension, hyperlipidemia who was brought to the emergency department from dialysis center with complaints of shortness of breath.  On presentation he was hyportensive.  Chest x-ray showed features of CHF.  CT imaging showed large bilateral pleural effusions, free fluid in the abdomen.  Nephrology consulted for dialysis.  Underwent thoracentesis.  Echo done here showed decreased ejection fraction from prior, cardiology consulted .  Underwent cardiac cath, found to have nonischemic cardiomyopathy, EP consulted  for biventricular ICD placement which is being planned to be done as an outpatient.PT/OT recommending SNF on discharge  Assessment & Plan:   Principal Problem:   Volume overload Active Problems:   Diabetes mellitus type 2 with complications (HCC)   Coronary atherosclerosis   Anemia in chronic kidney disease, on chronic dialysis (HCC)   ESRD on dialysis ([place])   Acute on chronic systolic CHF (congestive heart failure) (HCC)   Hypotension   Lactic acidosis   Thrombocytopenia (HCC)   Acute on chronic combined systolic and diastolic CHF (congestive heart failure) ([place])   Foreign body accidentally left during a procedure   Volume overload/acute  systolic congestive heart failure: Recently hospitalized for the same at [facility] dialysis here, nephrology following.  Chest  imaging was suggestive of vascular congestion, bilateral pleural effusions, abdominal free fluid, anasarca. New echo done here showed EF of less than 20%, global hypokinesis with significant septal  dyskinesis, indeterminate left ventricular diastolic parameters.  We consulted cardiology,underwent cardiac cath, found to have nonischemic cardiomyopathy, EP consulted  for consideration of  biventricular ICD placement which is being planned to be done as an outpatient because of his poor functional status. Cardiomyopathy is thought to be mediated by 100%  RV pacing .  He had a pacemaker placed on 9/22. He will follow-up with EP/cardiology as an outpatient.  ESRD: Nephrology following for dialysis.  He is still not able to sit on the chair during dialysis.  Nephrology planning for further sessions of dialysis while inpatient and check if he tolerates dialysis on sitting position  Acute hypoxic respiratory failure/bilateral pleural effusion: Underwent right-sided thoracentesis with removal of 1.2 L of fluid on 12/24.  Currently on 2 to 3 L of oxygen per minute.  Not on oxygen previously.  We will try to taper the oxygen,he might oxygen on DC  Hypertension: Blood pressure still soft.  On midodrine  Lactic acidosis: Most likely from hypoperfusion in the setting of hypotension, cardiogenic shock/third spacing/volume overload.  Continue monitoring.  No indication for antibiotic treatment.  Blood cultures no growth to date,afebrile.  Insulin-dependent type 2 diabetes: Recent A1c of 7.9%.  Continue sliding scale insulin  History of coronary artery disease: Status post CABG, stents.  No anginal symptoms.Echo as above.Cardiology was consulted and following.  Continue aspirin, lipitor  AV block: H/O  post pacemaker placement on 9/22  Thrombocytopenia: Stable, chronic.  Without any signs of bleeding  Normocytic anemia: Patient with ESRD.  Continue to monitor.  On ESA and IV iron per nephrology  Debility/deconditioning/left BKA: Wheelchair-bound.Pt/OT recommending discharge back to SNF on dc  Obesity: BMI of 33.4  Goals of care: Very poor prognosis due to  severe systolic congestive heart failure,  patient has significantly declined.  Minimal ambulation.  He lives in a skilled nursing facility.  We have thoroughly care for goals of care, currently full code         DVT prophylaxis:Heparin [place] Code Status: Full Family Communication:: Discussed with daughter  Dorian Furnace on the phone on 12/29 Patient status:Inpatient  Dispo: The patient is from: Home              Anticipated d/c is to: SNF              Anticipated d/c date is: In next 2 to 3 days.  Nephrology planning for another dialysis session tomorrow.  Discussed with nephrology today, nephology thinks that he is not ready for discharge.  They are trying to check if he can tolerate dialysis while sitting on the chair which he has not been yet.  Consultants: Nephrology  Procedures: Hemodialysis, thoracentesis  Antimicrobials:  Anti-infectives (From admission, onward)    Start     Dose/Rate Route Frequency Ordered Stop   09/19/21 1122  ceFAZolin (ANCEF) IVPB 2 g/50 mL premix        over 30 Minutes  Continuous PRN 09/19/21 1122 09/19/21 1122   09/15/21 1930  piperacillin-tazobactam (ZOSYN) IVPB 3.375 g        3.375 g 100 mL/hr over 30 Minutes Intravenous  Once 09/15/21 1921 09/15/21 2345       Subjective:  Patient seen and examined at the bedside this morning.  Blood pressure was soft but stable.  He was sitting on the chair when I evaluated him.  Looks very weak, deconditioned.  Denies any chest pain or worsening shortness of breath  Objective: Vitals:   09/20/21 1904 09/20/21 2008 09/20/21 2009 09/21/21 0439  BP:   (!) 101/58 94/67  Pulse: 88 69 60 82  Resp: 15  17 18   Temp:   97.7 F (36.5 C) 97.7 F (36.5 C)  TempSrc:   Oral Oral  SpO2: 100% 100% 100% 100%  Weight:    112.9 kg  Height:        Intake/Output Summary (Last 24 hours) at 09/21/2021 1114 Last data filed at 09/20/2021 2010 Gross per 24 hour  Intake --  Output 0 ml  Net 0 ml   Filed Weights   09/20/21 1246 09/20/21 1629 09/21/21 0439   Weight: 116.6 kg 115.1 kg 112.9 kg    Examination:   General exam: Very deconditioned, chronically ill looking, obese HEENT: PERRL Respiratory system: Diminished air sounds on the right side, no wheezing or crackles Cardiovascular system: S1 & S2 heard, RRR.  Dialysis catheter on the chest Gastrointestinal system: Abdomen is nondistended, soft and nontender. Central nervous system: Alert and oriented Extremities: Trace lower extremity edema, left BKA Skin: No rashes, no ulcers,no icterus     Data Reviewed: I have personally reviewed following labs and imaging studies  CBC: Recent Labs  Lab 09/15/21 1826 09/16/21 0427 09/18/21 0740 09/19/21 0519 09/19/21 1026 09/19/21 1029 09/19/21 1902 09/20/21 0316 09/21/21 0707  WBC 4.7   < > 5.6 6.0  --   --  5.0 5.6 9.4  NEUTROABS 2.5  --  2.9  --   --   --   --  4.3  --   HGB 9.3*   < > 9.6* 9.2* 11.6*   11.6* 11.6* 9.8* 9.2* 8.7*  HCT 30.6*   < > 29.6* 28.2* 34.0*   34.0* 34.0* 30.7* 29.6* 26.8*  MCV 83.6   < >  80.0 80.6  --   --  81.9 83.1 81.2  PLT 71*   < > 77* 67*  --   --  75* 73* 70*   < > = values in this interval not displayed.   Basic Metabolic Panel: Recent Labs  Lab 09/16/21 0427 09/16/21 1507 09/17/21 0442 09/18/21 0740 09/19/21 0519 09/19/21 1026 09/19/21 1029 09/19/21 1902 09/20/21 0316 09/21/21 0707  NA 134* 135 134* 135 135 137   137 136  --  134* 130*  K 3.9 4.2 4.2 4.3 4.2 4.5   4.5 4.5  --  5.0 4.4  CL 96* 97* 97* 96* 98  --   --   --  98 93*  CO2 25 25 25 26 25   --   --   --  16* 20*  GLUCOSE 71 107* 70 74 77  --   --   --  158* 126*  BUN 13 16 17 15 11   --   --   --  18 18  CREATININE 5.01* 5.44* 5.83* 5.14* 4.29*  --   --  4.68* 5.39* 4.22*  CALCIUM 7.2* 7.3* 7.0* 6.9* 7.1*  --   --   --  7.0* 6.9*  MG 2.1  --  2.1  --   --   --   --   --   --   --   PHOS  --  3.4 3.4 2.9  --   --   --   --   --   --    GFR: Estimated Creatinine Clearance: 22.6 mL/min (A) (by C-G formula based on SCr of 4.22  mg/dL (H)). Liver Function Tests: Recent Labs  Lab 09/15/21 1826 09/16/21 0427 09/16/21 1507 09/17/21 0442 09/18/21 0740  AST 49* 54*  --   --   --   ALT 30 33  --   --   --   ALKPHOS 147* 142*  --   --   --   BILITOT 2.1* 2.0*  --   --   --   PROT 7.5 7.4  --   --   --   ALBUMIN 2.7* 2.6* 2.4* 2.2* 2.3*   Recent Labs  Lab 09/15/21 1826  LIPASE 31   No results for input(s): AMMONIA in the last 168 hours. Coagulation Profile: No results for input(s): INR, PROTIME in the last 168 hours. Cardiac Enzymes: No results for input(s): CKTOTAL, CKMB, CKMBINDEX, TROPONINI in the last 168 hours. BNP (last 3 results) No results for input(s): PROBNP in the last 8760 hours. HbA1C: No results for input(s): HGBA1C in the last 72 hours.  CBG: Recent Labs  Lab 09/20/21 1713 09/20/21 2022 09/21/21 0011 09/21/21 0414 09/21/21 0717  GLUCAP 126* 129* 133* 159* 128*   Lipid Profile: No results for input(s): CHOL, HDL, LDLCALC, TRIG, CHOLHDL, LDLDIRECT in the last 72 hours. Thyroid Function Tests: No results for input(s): TSH, T4TOTAL, FREET4, T3FREE, THYROIDAB in the last 72 hours. Anemia Panel: No results for input(s): VITAMINB12, FOLATE, FERRITIN, TIBC, IRON, RETICCTPCT in the last 72 hours. Sepsis Labs: Recent Labs  Lab 09/17/21 0442 09/18/21 0740 09/19/21 0519 09/21/21 0406  LATICACIDVEN 4.3* 3.9* 3.6* 7.5*    Recent Results (from the past 240 hour(s))  Culture, blood (routine x 2)     Status: None   Collection Time: 09/15/21  6:28 PM   Specimen: Site Not Specified; Blood  Result Value Ref Range Status   Specimen Description SITE NOT SPECIFIED  Final   Special Requests  Final    BOTTLES DRAWN AEROBIC AND ANAEROBIC Blood Culture results may not be optimal due to an excessive volume of blood received in culture bottles   Culture   Final    NO GROWTH 5 DAYS Performed at [facility] [address], [place], [place] [address]    Report Status 09/20/2021 FINAL   Final  Culture, blood (routine x 2)     Status: None   Collection Time: 09/15/21  6:29 PM   Specimen: Site Not Specified; Blood  Result Value Ref Range Status   Specimen Description SITE NOT SPECIFIED  Final   Special Requests   Final    BOTTLES DRAWN AEROBIC AND ANAEROBIC Blood Culture results may not be optimal due to an excessive volume of blood received in culture bottles   Culture   Final    NO GROWTH 5 DAYS Performed at [facility], [place] [address] [place], [place] [address]    Report Status 09/20/2021 FINAL  Final  Resp Panel by RT-PCR (Flu A&B, Covid) Nasopharyngeal Swab     Status: None   Collection Time: 09/16/21  2:55 AM   Specimen: Nasopharyngeal Swab; Nasopharyngeal(NP) swabs in vial transport medium  Result Value Ref Range Status   SARS Coronavirus 2 by RT PCR NEGATIVE NEGATIVE Final    Comment: (NOTE) SARS-CoV-2 target nucleic acids are NOT DETECTED.  The SARS-CoV-2 RNA is generally detectable in upper respiratory specimens during the acute phase of infection. The lowest concentration of SARS-CoV-2 viral copies this assay can detect is 138 copies/mL. A negative result does not preclude SARS-Cov-2 infection and should not be used as the sole basis for treatment or other patient management decisions. A negative result may occur with  improper specimen collection/handling, submission of specimen other than nasopharyngeal swab, presence of viral mutation(s) within the areas targeted by this assay, and inadequate number of viral copies(<138 copies/mL). A negative result must be combined with clinical observations, patient history, and epidemiological information. The expected result is Negative.  Fact Sheet for Patients:  EntrepreneurPulse.com.au  Fact Sheet for Healthcare Providers:  IncredibleEmployment.be  This test is no t yet approved or cleared by the [place] FDA and  has been authorized for detection and/or  diagnosis of SARS-CoV-2 by FDA under an Emergency Use Authorization (EUA). This EUA will remain  in effect (meaning this test can be used) for the duration of the COVID-19 declaration under Section 564(b)(1) of the Act, 21 U.S.C.section 360bbb-3(b)(1), unless the authorization is terminated  or revoked sooner.       Influenza A by PCR NEGATIVE NEGATIVE Final   Influenza B by PCR NEGATIVE NEGATIVE Final    Comment: (NOTE) The Xpert Xpress SARS-CoV-2/FLU/RSV plus assay is intended as an aid in the diagnosis of influenza from Nasopharyngeal swab specimens and should not be used as a sole basis for treatment. Nasal washings and aspirates are unacceptable for Xpert Xpress SARS-CoV-2/FLU/RSV testing.  Fact Sheet for Patients: EntrepreneurPulse.com.au  Fact Sheet for Healthcare Providers: IncredibleEmployment.be  This test is not yet approved or cleared by the [place] FDA and has been authorized for detection and/or diagnosis of SARS-CoV-2 by FDA under an Emergency Use Authorization (EUA). This EUA will remain in effect (meaning this test can be used) for the duration of the COVID-19 declaration under Section 564(b)(1) of the Act, 21 U.S.C. section 360bbb-3(b)(1), unless the authorization is terminated or revoked.  Performed at [facility], [place] [address] [place], [place] [address]  Radiology Studies: No results found.      Scheduled Meds:  aspirin EC  81 mg Oral Daily   atorvastatin  80 mg Oral QPC supper   baclofen  10 mg Oral Once   Chlorhexidine Gluconate Cloth  6 each Topical Q0600   [START ON 09/22/2021] doxercalciferol  3 mcg Intravenous Q M,W,F-HD   heparin  5,000 Units Subcutaneous Q8H   midodrine  10 mg Oral TID WC   sodium chloride flush  3 mL Intravenous Q12H   sodium chloride flush  3 mL Intravenous Q12H   Continuous Infusions:  sodium chloride       LOS: 5 days    Time spent: 25 mins.More  than 50% of that time was spent in counseling and/or coordination of care.      Shelly Coss, MD [facility] P12/29/2022, 11:14 AM

## 2021-09-21 NOTE — Progress Notes (Signed)
Mobility Specialist Progress Note:   09/21/21 1100  Mobility  Activity Transferred:  Chair to bed  Level of Assistance Total care  Assistive Device MaxiMove  Mobility Response Tolerated well  Mobility performed by Mobility specialist;Nurse tech  $Mobility charge 1 Mobility   Transfer back to bed.   [place] Public librarian Phone [phone] Secondary Phone [phone]

## 2021-09-21 NOTE — Consult Note (Signed)
Consultation Note Date: 09/21/2021   Patient Name: Jonathon Bailey  DOB: 1955-10-12  MRN: [id number]  Age / Sex: 65 y.o., male  PCP: [facility] Referring Physician: Shelly Coss, MD  Reason for Consultation: Establishing goals of care  HPI/Patient Profile: 65 y.o. male   admitted on 09/15/2021 with  history of ESRD on dialysis Monday Wednesday Friday schedule, coronary artery disease status post CABG, stents, systolic CHF, AV block status post pacemaker placement, diabetes type 2, CVA, left BKA, hypertension, hyperlipidemia who was brought to the emergency department from dialysis center with complaints of shortness of breath.      Chest x-ray showed features of CHF.  CT imaging showed large bilateral pleural effusions, free fluid in the abdomen.    Nephrology consulted for dialysis.  Underwent thoracentesis.  Echo significant for decreased ejection fraction from prior, cardiology consulted .    Underwent cardiac cath, found to have nonischemic cardiomyopathy, EP consulted  for biventricular ICD placement which may be considered  as an outpatient.  Today is day 5 of this hospital stay.  He is not improving as hoped within the context of full medical support     He is high risk for decompensation.  Patient faces treatment option decisions, advanced directive decisions and anticipatory care needs.     Clinical Assessment and Goals of Care:  This NP Jonathon Bailey reviewed medical records, received report from team, assessed the patient and then meet at the patient's bedside  to discuss diagnosis, prognosis, GOC, EOL wishes disposition and options.  Patient declines meeting to include family.  He has one son and a daughter.   Concept of Palliative Care was introduced as specialized medical care for people and their families living with serious illness.  If focuses on providing relief from  the symptoms and stress of a serious illness.  The goal is to improve quality of life for both the patient and the family.  Values and goals of care important to patient and family were attempted to be elicited.  Education offered regarding the seriousness of his current medical situation specifically regarding his worsening heart failure and ESRD.  Jonathon Jonathon Bailey understanding of the seriousness of his medical situation.  Education offered regarding the limitation of viable medical interventions to prolong quality of life.  He tells me "I really don't want to hear it, do everything, resuscitate me until you are tired".  Therapeutic listening.  Patient declined spiritual care.      A  discussion was had today regarding advanced directives.  Concepts specific to code status, artifical feeding and hydration, continued IV antibiotics and rehospitalization was had.    The difference between a aggressive medical intervention path  and a palliative comfort care path for this patient at this time was had.        Questions and concerns addressed.  Patient  encouraged to call with questions or concerns.     PMT will continue to support holistically, I will f/u with Jonathon Bailey on Saturday  No documented healthcare power of attorney or advanced care planning documents.  Education offered on the importance of documentation of these documents.  Offered support from spiritual care department and assisting him in completing these documents, declined at this time. Education and introduction of MOST form      SUMMARY OF RECOMMENDATIONS    Code Status/Advance Care Planning: Full code Educated patient to consider DNR/DNI status understanding evidenced based poor outcomes in similar hospitalized patient, as the cause of arrest is likely associated with advanced chronic illness rather than an easily reversible acute cardio-pulmonary event.     Palliative Prophylaxis:  Delirium  Protocol, Frequent Pain Assessment, and Oral Care  Additional Recommendations (Limitations, Scope, Preferences): Full Scope Treatment  Psycho-social/Spiritual:  Desire for further Chaplaincy support:no-declined, he tells me that he has his own pastor   Prognosis:  Unable to determine-long term prognosis is poor  Discharge Planning: To Be Determined      Primary Diagnoses: Present on Admission:  Volume overload  Diabetes mellitus type 2 with complications (HCC)  Coronary atherosclerosis  Acute on chronic systolic CHF (congestive heart failure) (HCC)  Hypotension  Lactic acidosis  Thrombocytopenia (HCC)  Acute on chronic combined systolic and diastolic CHF (congestive heart failure) ([place])   I have reviewed the medical record, interviewed the patient and family, and examined the patient. The following aspects are pertinent.  Past Medical History:  Diagnosis Date   Anxiety    Arthritis    Asthma    "as a child"   CAD (coronary artery disease)    Chronic systolic heart failure ([place]) 09/21/2008   ECHO Feb 2013 showed LVEF low normal at 50-55%, +hypokinetic anterolateral wall and inferolateral wall.     CKD (chronic kidney disease) stage 4, GFR 15-29 ml/min (HCC) 08/30/2009   Progressive renal failure since 2008, creatinine 1.2 in 2008 up to 3.5 in 2012 and 3.2-5.0 in 2013. All UA's 2011-13 showed >300 protein on dipstick. Work-up in May 2011 showed negative Urine IFE and SPEP, ultrasound showed 12-13 cm kidneys with increased echogenicity and UPC ratio was 1.5 gm proteinuria.  Hgb A1C's from 2011 to 2013 were all between 9-11.  Patient saw Dr. Donnetta Hutching (vasc surgery) for HD access in Aug 2013 > vein mapping was done and Dr. Donnetta Hutching felt the left arm (pt is R handed) was suitable for L arm Cimino radiocephalic fistula. Patient said he wasn't ready to consider doing dialysis and declined the surgery.      Depression    Diabetic retinopathy ([place])    ESRD (end stage renal disease) on  dialysis ([place])    Headache(784.0)    "maybe monthly" (03/15/2014)   Hyperlipidemia    Hypertension    Macular edema    Myocardial infarction [facility])    status post MI x2 and 3 stents placed in 2003   Obesity    Sleep apnea    does not use CPAP   Stroke ([place]) ~ 2007; ~1987   "weak on right side; messed w/right side of brain; cry all the time"   Type II diabetes mellitus (HCC)    insulin dependent   Social History   Socioeconomic History   Marital status: Divorced    Spouse name: Not on file   Number of children: Not on file   Years of education: Not on file   Highest education level: Not on file  Occupational History   Not on file  Tobacco Use   Smoking status: Former    Packs/day: 1.00  Years: 15.00    Pack years: 15.00    Types: Cigarettes    Quit date: 09/24/1986    Years since quitting: 35.0   Smokeless tobacco: Never  Vaping Use   Vaping Use: Never used  Substance and Sexual Activity   Alcohol use: Not Currently    Comment: stopped in 2013   Drug use: No   Sexual activity: Not Currently  Other Topics Concern   Not on file  Social History Narrative   Not on file   Social Determinants of Health   Financial Resource Strain: Not on file  Food Insecurity: Not on file  Transportation Needs: Not on file  Physical Activity: Not on file  Stress: Not on file  Social Connections: Not on file   Family History  Problem Relation Age of Onset   Asthma Mother    Hyperlipidemia Mother    Hypertension Mother    Stroke Father    Heart attack Father    Prostate cancer Father    Deep vein thrombosis Father    Cancer Father    Diabetes Father    Hyperlipidemia Father    Hypertension Father    Other Father        varicose veins   Heart disease Father        before age 13   Other Sister        varicose veins   Scheduled Meds:  aspirin EC  81 mg Oral Daily   atorvastatin  80 mg Oral QPC supper   baclofen  10 mg Oral Once   Chlorhexidine Gluconate Cloth  6 each  Topical Q0600   [START ON 09/22/2021] doxercalciferol  3 mcg Intravenous Q M,W,F-HD   heparin  5,000 Units Subcutaneous Q8H   midodrine  10 mg Oral TID WC   sodium chloride flush  3 mL Intravenous Q12H   sodium chloride flush  3 mL Intravenous Q12H   Continuous Infusions:  sodium chloride     PRN Meds:.sodium chloride, acetaminophen **OR** acetaminophen, albuterol, dextrose, hydrOXYzine, ondansetron **OR** ondansetron (ZOFRAN) IV, senna-docusate, sodium chloride flush Medications Prior to Admission:  Prior to Admission medications   Medication Sig Start Date End Date Taking? Authorizing Provider  acetaminophen (TYLENOL) 500 MG tablet Take 1,000 mg by mouth every 6 (six) hours as needed for headache (pain).   Yes [provider]  albuterol (ACCUNEB) 0.63 MG/3ML nebulizer solution Take 1 ampule by nebulization every 6 (six) hours as needed for wheezing.   Yes [provider]  albuterol (PROVENTIL HFA;VENTOLIN HFA) 108 (90 BASE) MCG/ACT inhaler Inhale 2 puffs into the lungs every 6 (six) hours as needed (COPD).   Yes [provider]  aspirin 325 MG tablet Take 325 mg by mouth daily.   Yes [provider]  atorvastatin (LIPITOR) 80 MG tablet Take 80 mg by mouth daily after supper.   Yes [provider]  B Complex Vitamins (VITAMIN B COMPLEX) TABS Take 1 tablet by mouth every Monday, Wednesday, and Friday. Dialysis days   Yes [provider]  cinacalcet (SENSIPAR) 90 MG tablet Take 90 mg by mouth daily.   Yes [provider]  epoetin alfa (EPOGEN) 10000 UNIT/ML injection Inject 10,000 Units into the skin every 3 (three) days.   Yes [provider]  fluticasone (FLONASE) 50 MCG/ACT nasal spray Place 1 spray into both nostrils daily.   Yes [provider]  folic acid (FOLVITE) 494 MCG tablet Take 400 mcg by mouth every Monday, Wednesday, and Friday.  Dialysis days   Yes [provider]  hydrOXYzine (ATARAX) 25  MG tablet Take 25 mg by mouth every 6 (six) hours as needed for itching (allergies).   Yes [provider]  insulin lispro (HUMALOG) 100 UNIT/ML injection Inject 2-12 Units into the skin See admin instructions. Inject 2-12 units subcutaneously three times daily before meals per sliding scale - CBG 201-250 2 units, 251-300 4 units, 301-350 6 units, 351-400 8 units, 401-450 10 units, >450 12 units and call MD   Yes [provider]  lactulose (CHRONULAC) 10 GM/15ML solution Take 20 g by mouth daily as needed (constipation).   Yes [provider]  lanthanum (FOSRENOL) 500 MG chewable tablet Chew 500 mg by mouth See admin instructions. Chew one tablet (500 mg) by mouth with the largest meal of the day   Yes [provider]  loperamide (IMODIUM) 2 MG capsule Take 2 mg by mouth daily as needed for diarrhea or loose stools.   Yes [provider]  metoCLOPramide (REGLAN) 5 MG tablet Take 5 mg by mouth 3 (three) times daily as needed for nausea.   Yes [provider]  midodrine (PROAMATINE) 5 MG tablet Take 5 mg by mouth 3 (three) times daily after meals. Hold for SBP >130   Yes [provider]  nitroGLYCERIN (NITROSTAT) 0.4 MG SL tablet Place 0.4 mg under the tongue every 5 (five) minutes as needed for chest pain.   Yes [provider]  pantoprazole (PROTONIX) 40 MG tablet Take 1 tablet (40 mg total) by mouth daily. Patient taking differently: Take 40 mg by mouth daily at 6 (six) AM. 05/07/20  Yes Simmons-Robinson, Makiera, MD  polyethylene glycol (MIRALAX / GLYCOLAX) packet Take 17 g by mouth daily. Patient taking differently: Take 17 g by mouth daily as needed (constipation). Mix with 6 oz fluid 09/08/14  Yes Gottschalk, Ashly M, DO  tiotropium (SPIRIVA) 18 MCG inhalation capsule Place 18 mcg into inhaler and inhale daily as needed (COPD).   Yes [provider]  vitamin C (ASCORBIC ACID) 500 MG tablet Take 500 mg by mouth daily.    Yes [provider]   Allergies  Allergen Reactions   Ferumoxytol Itching        Iron Itching         Iodinated Contrast Media Itching   Sildenafil Other (See Comments)    Other reaction(s): Abnormal sexual function   Review of Systems  Constitutional:  Positive for fatigue.  Neurological:  Positive for weakness.   Physical Exam Constitutional:      Appearance: He is cachectic. He is ill-appearing.     Interventions: Nasal cannula in place.  Cardiovascular:     Rate and Rhythm: Normal rate.  Pulmonary:     Effort: Pulmonary effort is normal.  Skin:    General: Skin is warm and dry.  Neurological:     Mental Status: He is alert.    Vital Signs: BP 94/67 (BP Location: Left Arm)    Pulse 82    Temp 97.7 F (36.5 C) (Oral)    Resp 18    Ht 5\' 11"  (1.803 m)    Wt 112.9 kg    SpO2 100%    BMI 34.73 kg/m  Pain Scale: 0-10 POSS *See Group Information*: 1-Acceptable,Awake and alert Pain Score: 0-No pain   SpO2: SpO2: 100 % O2 Device:SpO2: 100 % O2 Flow Rate: .O2 Flow Rate (L/min): 2.5 L/min  IO: Intake/output summary:  Intake/Output Summary (Last 24 hours)  at 09/21/2021 1980 Last data filed at 09/20/2021 2010 Gross per 24 hour  Intake 237 ml  Output 0 ml  Net 237 ml    LBM: Last BM Date: 09/19/21 Baseline Weight: Weight: (!) 245 kg Most recent weight: Weight: 112.9 kg     Palliative Assessment/Data: 30%   Discussed with Dr Tawanna Solo and [facility] team   Time In: 0830 Time Out: 0946 Time Total: 75 minutes Greater than 50%  of this time was spent counseling and coordinating care related to the above assessment and plan.  Signed by: Jonathon Lessen, NP   Please contact Palliative Medicine Team phone at [phone] for questions and concerns.  For individual provider: See Shea Evans

## 2021-09-21 NOTE — Progress Notes (Signed)
° °                                   CROSS COVER NOTE  NAME: Jonathon Bailey MRN: [id number] DOB : 1956-01-29   Page received from RN notifying of critical lactic acid level of 7.5. On chart review it appears that patient has known lactic acidosis that the day team has attributed to hypoperfusion.  Patient with an EF less than 20% and unable to provide fluid bolus at this time.  Patient is mentating well, no clinical change reported.  No leukocytosis shown on CBC yesterday.  No CBC ordered this morning, order placed for CBC.  We will continue to follow patient closely.   Neomia Glass MHA, MSN, FNP-BC Nurse Practitioner [facility]

## 2021-09-21 NOTE — Progress Notes (Signed)
Pt receives out-pt HD at [facility] on MWF while at snf. Pt arrives at 10:30 for 10:45 chair time. If/when pt returns home, pt will resume TTS 5:30 arrival/5:45 chair. Will provide update to clinic regarding pt's likely return to snf at d/c. Will assist as needed.    Melven Sartorius Renal Navigator [phone]

## 2021-09-21 NOTE — Progress Notes (Signed)
[facility] Progress Note    Assessment/ Plan:   Hypotension/volume ^  - CXR 12/24 w/ bilat pleural effusions, vascular congestion. Increased midodrine dose. Repeat CXR w/o edema 12/24 post thoracentesis. Had 2 L off w/ HD here then 2nd HD did not pull any fluid due to low BP's. 2kg up. Doubt sig volume overload at this point.  HFrEF - echo here showed LVEF < 20%. Per cardiology.  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 Need to document at least 1 HD in the chair prior to dc home. HD in am tomorrow.   Anemia  - HGB down to 8.9. ESA ordered.   Metabolic bone disease -  Corrected Ca 8.3. PO4 wnl. Continue VDRA, sensipar, binders.   Nutrition - Renal/Carb mod diet Albumin 2.6. Add protein supps.  DMT2 per primary  H/O CABG PAD sp L BKA Dialysis access: [facility] TDC. Refuses avf/avg Debility/ deconditioning/ L BKA - pt is WC bound   [name], MD 09/21/2021, 1:55 PM    OP HD: [facility] MWF   4.5h  F250  400/800   111kg  2/2 bath  P2  TDC RIJ   Heparin 12K -Hectorol 3 mcg IV TIW -Mircera 30 mcg IV q 4 weeks (Last dose11/25/2022)    Subjective:   Seen in room, O x3, no new c/o's   Objective:   BP (!) 86/57 (BP Location: Left Arm)    Pulse 86    Temp 97.9 F (36.6 C) (Oral)    Resp 18    Ht 5\' 11"  (1.803 m)    Wt 112.9 kg    SpO2 100%    BMI 34.73 kg/m   Intake/Output Summary (Last 24 hours) at 09/21/2021 1355 Last data filed at 09/20/2021 2010 Gross per 24 hour  Intake --  Output 0 ml  Net 0 ml    Weight change: 9.551 kg  Physical Exam: Gen:nad, chronically ill appearing, gets up to side of bed w/o assistance CVS:s1s2, rrr Resp:decreased air entry bibasilar, no w/r/r/c, unlabored, bl chest expansion BHA:LPFXT, soft KWI:OXBDZ+ 1 RLE edema, L BKA Neuro: awake, alert Dialysis access: [facility]   Labs: BMET Recent Labs  Lab 09/16/21 0427 09/16/21 1507 09/17/21 0442 09/18/21 0740 09/19/21 0519 09/19/21 1026  09/19/21 1029 09/19/21 1902 09/20/21 0316 09/21/21 0707  NA 134* 135 134* 135 135 137   137 136  --  134* 130*  K 3.9 4.2 4.2 4.3 4.2 4.5   4.5 4.5  --  5.0 4.4  CL 96* 97* 97* 96* 98  --   --   --  98 93*  CO2 25 25 25 26 25   --   --   --  16* 20*  GLUCOSE 71 107* 70 74 77  --   --   --  158* 126*  BUN 13 16 17 15 11   --   --   --  18 18  CREATININE 5.01* 5.44* 5.83* 5.14* 4.29*  --   --  4.68* 5.39* 4.22*  CALCIUM 7.2* 7.3* 7.0* 6.9* 7.1*  --   --   --  7.0* 6.9*  PHOS  --  3.4 3.4 2.9  --   --   --   --   --   --     CBC Recent Labs  Lab 09/15/21 1826 09/16/21 0427 09/18/21 0740 09/19/21 0519 09/19/21 1026 09/19/21 1029 09/19/21 1902 09/20/21 0316 09/21/21 0707  WBC  4.7   < > 5.6 6.0  --   --  5.0 5.6 9.4  NEUTROABS 2.5  --  2.9  --   --   --   --  4.3  --   HGB 9.3*   < > 9.6* 9.2*   < > 11.6* 9.8* 9.2* 8.7*  HCT 30.6*   < > 29.6* 28.2*   < > 34.0* 30.7* 29.6* 26.8*  MCV 83.6   < > 80.0 80.6  --   --  81.9 83.1 81.2  PLT 71*   < > 77* 67*  --   --  75* 73* 70*   < > = values in this interval not displayed.     Medications:     aspirin EC  81 mg Oral Daily   atorvastatin  80 mg Oral QPC supper   baclofen  10 mg Oral Once   Chlorhexidine Gluconate Cloth  6 each Topical Q0600   [START ON 09/22/2021] doxercalciferol  3 mcg Intravenous Q M,W,F-HD   heparin  5,000 Units Subcutaneous Q8H   insulin aspart  0-9 Units Subcutaneous TID WC   midodrine  10 mg Oral TID WC   sodium chloride flush  3 mL Intravenous Q12H   sodium chloride flush  3 mL Intravenous Q12H

## 2021-09-22 LAB — RESP PANEL BY RT-PCR (FLU A&B, COVID) ARPGX2
Influenza A by PCR: NEGATIVE
Influenza B by PCR: NEGATIVE
SARS Coronavirus 2 by RT PCR: NEGATIVE

## 2021-09-22 LAB — BASIC METABOLIC PANEL
Anion gap: 12 (ref 5–15)
BUN: 26 mg/dL — ABNORMAL HIGH (ref 8–23)
CO2: 22 mmol/L (ref 22–32)
Calcium: 6.9 mg/dL — ABNORMAL LOW (ref 8.9–10.3)
Chloride: 98 mmol/L (ref 98–111)
Creatinine, Ser: 5.06 mg/dL — ABNORMAL HIGH (ref 0.61–1.24)
GFR, Estimated: 12 mL/min — ABNORMAL LOW (ref >60)
Glucose, Bld: 122 mg/dL — ABNORMAL HIGH (ref 70–99)
Potassium: 4.4 mmol/L (ref 3.5–5.1)
Sodium: 132 mmol/L — ABNORMAL LOW (ref 135–145)

## 2021-09-22 LAB — GLUCOSE, CAPILLARY
Glucose-Capillary: 105 mg/dL — ABNORMAL HIGH (ref 70–99)
Glucose-Capillary: 115 mg/dL — ABNORMAL HIGH (ref 70–99)
Glucose-Capillary: 122 mg/dL — ABNORMAL HIGH (ref 70–99)
Glucose-Capillary: 145 mg/dL — ABNORMAL HIGH (ref 70–99)
Glucose-Capillary: 99 mg/dL (ref 70–99)

## 2021-09-22 LAB — CBC WITH DIFFERENTIAL/PLATELET
Abs Immature Granulocytes: 0.05 10*3/uL (ref 0.00–0.07)
Basophils Absolute: 0 10*3/uL (ref 0.0–0.1)
Basophils Relative: 0 %
Eosinophils Absolute: 0 10*3/uL (ref 0.0–0.5)
Eosinophils Relative: 0 %
HCT: 24.9 % — ABNORMAL LOW (ref 39.0–52.0)
Hemoglobin: 8.3 g/dL — ABNORMAL LOW (ref 13.0–17.0)
Immature Granulocytes: 1 %
Lymphocytes Relative: 8 %
Lymphs Abs: 0.7 10*3/uL (ref 0.7–4.0)
MCH: 26.6 pg (ref 26.0–34.0)
MCHC: 33.3 g/dL (ref 30.0–36.0)
MCV: 79.8 fL — ABNORMAL LOW (ref 80.0–100.0)
Monocytes Absolute: 1 10*3/uL (ref 0.1–1.0)
Monocytes Relative: 11 %
Neutro Abs: 7.2 10*3/uL (ref 1.7–7.7)
Neutrophils Relative %: 80 %
Platelets: 69 10*3/uL — ABNORMAL LOW (ref 150–400)
RBC: 3.12 MIL/uL — ABNORMAL LOW (ref 4.22–5.81)
RDW: 21.2 % — ABNORMAL HIGH (ref 11.5–15.5)
WBC: 9 10*3/uL (ref 4.0–10.5)
nRBC: 0.9 % — ABNORMAL HIGH (ref 0.0–0.2)

## 2021-09-22 LAB — HEPATITIS B SURFACE ANTIBODY, QUANTITATIVE: Hep B S AB Quant (Post): 114.4 m[IU]/mL (ref >9.9)

## 2021-09-22 LAB — LACTIC ACID, PLASMA: Lactic Acid, Venous: 4.9 mmol/L (ref 0.5–1.9)

## 2021-09-22 MED ORDER — ALBUMIN HUMAN 25 % IV SOLN: 25.0000 g | Freq: Once | INTRAVENOUS | Status: AC

## 2021-09-22 MED ORDER — DOXERCALCIFEROL 4 MCG/2ML IV SOLN: 3.0000 ug | INTRAVENOUS | Status: AC

## 2021-09-22 MED ORDER — ALBUMIN HUMAN 25 % IV SOLN
INTRAVENOUS | Status: AC
  Administered 2021-09-22: 25 g via INTRAVENOUS
  Filled 2021-09-22: qty 100

## 2021-09-22 MED ORDER — ASPIRIN 81 MG PO TBEC: 81.0000 mg | DELAYED_RELEASE_TABLET | Freq: Every day | ORAL | 11 refills | Status: AC

## 2021-09-22 MED ORDER — GLUCOSE 40 % PO GEL: 1.0000 | ORAL | Status: AC | PRN

## 2021-09-22 MED ORDER — HEPARIN SODIUM (PORCINE) 1000 UNIT/ML DIALYSIS
6000.0000 [IU] | INTRAMUSCULAR | Status: DC | PRN
  Filled 2021-09-22: qty 6

## 2021-09-22 MED ORDER — MIDODRINE HCL 10 MG PO TABS: 10.0000 mg | ORAL_TABLET | Freq: Three times a day (TID) | ORAL | Status: AC

## 2021-09-22 NOTE — Progress Notes (Signed)
Physical Therapy Treatment Patient Details Name: Jonathon Bailey MRN: [id number] DOB: October 19, 1955 Today's Date: 09/22/2021   History of Present Illness Pt is a 65 y.o. male who presented 09/15/21 with SOB following his HD treatment session. Pt also with R leg edema. Pt with volume overload, acute on chronic systolic CHF with bilateral pleural effusions. PMH: ESRD on MWF HD, CAD s/p CABG, chronic systolic CHF (EF 63-87% 5/64/3329), high-grade AV block s/p leadless PPM 06/09/2021, T2DM, HTN, HLD, history of CVA, s/p left BKA, anemia of chronic kidney disease    PT Comments    Pt needed to transfer into HD recliner for HD. Recliner without drop arms so lateral transfer  not possible. Used maximove for bed to HD recliner. Pt tolerated transfer well.   Recommendations for follow up therapy are one component of a multi-disciplinary discharge planning process, led by the attending physician.  Recommendations may be updated based on patient status, additional functional criteria and insurance authorization.  Follow Up Recommendations  Skilled nursing-short term rehab (<3 hours/day)     Assistance Recommended at Discharge Frequent or constant Supervision/Assistance  Equipment Recommendations  None recommended by PT    Recommendations for Other Services       Precautions / Restrictions Precautions Precautions: Fall;Other (comment) Precaution Comments: prior L BKA     Mobility  Bed Mobility           General bed mobility comments: Pt sitting EOB    Transfers Overall transfer level: Needs assistance   Transfers: Bed to chair/wheelchair/BSC             General transfer comment: Used maximove to go from sitting EOB to HD recliner. Had pt lean side to side on edge of bed to place lift pad for lift.    Ambulation/Gait                   Stairs             Wheelchair Mobility    Modified Rankin (Stroke Patients Only)       Balance Overall balance  assessment: Needs assistance Sitting-balance support: No upper extremity supported;Feet supported Sitting balance-Leahy Scale: Fair                                      Cognition Arousal/Alertness: Awake/alert Behavior During Therapy: WFL for tasks assessed/performed   Area of Impairment: Safety/judgement                         Safety/Judgement: Decreased awareness of safety              Exercises     General Comments        Pertinent Vitals/Pain Pain Assessment: Faces Faces Pain Scale: Hurts little more Pain Location: buttocks, posterior thighs at wounds Pain Descriptors / Indicators: Discomfort;Grimacing Pain Intervention(s): Repositioned;Limited activity within patient's tolerance    Home Living                          Prior Function            PT Goals (current goals can now be found in the care plan section) Acute Rehab PT Goals Patient Stated Goal: to get better Progress towards PT goals: Progressing toward goals    Frequency    Min 2X/week      PT Plan  Current plan remains appropriate    Co-evaluation              AM-PAC PT "6 Clicks" Mobility   Outcome Measure  Help needed turning from your back to your side while in a flat bed without using bedrails?: A Little Help needed moving from lying on your back to sitting on the side of a flat bed without using bedrails?: A Little Help needed moving to and from a bed to a chair (including a wheelchair)?: Total Help needed standing up from a chair using your arms (e.g., wheelchair or bedside chair)?: Total Help needed to walk in hospital room?: Total Help needed climbing 3-5 steps with a railing? : Total 6 Click Score: 10    End of Session   Activity Tolerance: Patient tolerated treatment well Patient left: in chair;Other (comment) (Pt being taken to HD) Nurse Communication: Mobility status;Need for lift equipment (Nurse assisted with transfer) PT  Visit Diagnosis: Other abnormalities of gait and mobility (R26.89);Muscle weakness (generalized) (M62.81)     Time: 7628-3151 PT Time Calculation (min) (ACUTE ONLY): 14 min  Charges:  $Therapeutic Activity: 8-22 mins                     [place] Pager [phone] Office [place] 09/22/2021, 2:15 PM

## 2021-09-22 NOTE — Progress Notes (Signed)
[facility] Progress Note    Assessment/ Plan:   Hypotension/volume ^  - CXR 12/24 w/ bilat pleural effusions, vascular congestion. Increased midodrine dose. Repeat CXR w/o edema 12/24 post thoracentesis. 2kg up. Doubt sig volume overload at this point.  HFrEF - echo here showed LVEF < 20%. Per cardiology.  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 Need to document at least 1 HD in the chair prior to dc home. HD today. HD time will be shortened due to high census/ staffing issues.   Anemia  - HGB down to 8.9. ESA ordered.   Metabolic bone disease -  Corrected Ca 8.3. PO4 wnl. Continue VDRA, sensipar, binders.   Nutrition - Renal/Carb mod diet Albumin 2.6. Add protein supps.  DMT2 per primary  H/O CABG PAD sp L BKA Dialysis access: [facility] TDC. Refuses avf/avg Debility/ deconditioning/ L BKA - pt is WC bound Dispo - stable for d/c from renal standpoint after HD today    [name], MD 09/22/2021, 11:31 AM    OP HD: [facility] MWF   4.5h  F250  400/800   111kg  2/2 bath  P2  TDC RIJ   Heparin 12K -Hectorol 3 mcg IV TIW -Mircera 30 mcg IV q 4 weeks (Last dose11/25/2022)    Subjective:   Seen in room, refuses full HD today , will agree to 2 hrs HD   Objective:   BP (!) 86/63    Pulse 84    Temp (!) 97.5 F (36.4 C) (Oral)    Resp 13    Ht 5\' 11"  (1.803 m)    Wt 112.9 kg    SpO2 100%    BMI 34.73 kg/m   Intake/Output Summary (Last 24 hours) at 09/22/2021 1131 Last data filed at 09/21/2021 2137 Gross per 24 hour  Intake 460 ml  Output --  Net 460 ml    Weight change:   Physical Exam: Gen:nad, chronically ill appearing, gets up to side of bed w/o assistance CVS:s1s2, rrr Resp:decreased air entry bibasilar, no w/r/r/c, unlabored, bl chest expansion ZOX:WRUEA, soft VWU:JWJXB+ 1 RLE edema, L BKA Neuro: awake, alert Dialysis access: [facility]   Labs: BMET Recent Labs  Lab 09/16/21 1507 09/17/21 0442  09/18/21 0740 09/19/21 0519 09/19/21 1026 09/19/21 1029 09/19/21 1902 09/20/21 0316 09/21/21 0707 09/22/21 0316  NA 135 134* 135 135 137   137 136  --  134* 130* 132*  K 4.2 4.2 4.3 4.2 4.5   4.5 4.5  --  5.0 4.4 4.4  CL 97* 97* 96* 98  --   --   --  98 93* 98  CO2 25 25 26 25   --   --   --  16* 20* 22  GLUCOSE 107* 70 74 77  --   --   --  158* 126* 122*  BUN 16 17 15 11   --   --   --  18 18 26*  CREATININE 5.44* 5.83* 5.14* 4.29*  --   --  4.68* 5.39* 4.22* 5.06*  CALCIUM 7.3* 7.0* 6.9* 7.1*  --   --   --  7.0* 6.9* 6.9*  PHOS 3.4 3.4 2.9  --   --   --   --   --   --   --     CBC Recent Labs  Lab 09/15/21 1826 09/16/21 0427 09/18/21 0740 09/19/21 0519 09/19/21 1902 09/20/21 0316 09/21/21 0707 09/22/21 0316  WBC 4.7   < > 5.6   < > 5.0 5.6 9.4 9.0  NEUTROABS 2.5  --  2.9  --   --  4.3  --  7.2  HGB 9.3*   < > 9.6*   < > 9.8* 9.2* 8.7* 8.3*  HCT 30.6*   < > 29.6*   < > 30.7* 29.6* 26.8* 24.9*  MCV 83.6   < > 80.0   < > 81.9 83.1 81.2 79.8*  PLT 71*   < > 77*   < > 75* 73* 70* 69*   < > = values in this interval not displayed.     Medications:     aspirin EC  81 mg Oral Daily   atorvastatin  80 mg Oral QPC supper   Chlorhexidine Gluconate Cloth  6 each Topical Q0600   doxercalciferol  3 mcg Intravenous Q M,W,F-HD   heparin  5,000 Units Subcutaneous Q8H   insulin aspart  0-9 Units Subcutaneous TID WC   midodrine  10 mg Oral TID WC   sodium chloride flush  3 mL Intravenous Q12H   sodium chloride flush  3 mL Intravenous Q12H

## 2021-09-22 NOTE — Progress Notes (Signed)
Contacted [facility] to make clinic aware pt to d/c to snf today and will need to resume on Monday.   Melven Sartorius Renal Navigator [phone]

## 2021-09-22 NOTE — Progress Notes (Addendum)
Physical Therapy Treatment Patient Details Name: Jonathon Bailey MRN: [id number] DOB: 05-29-1956 Today's Date: 09/22/2021   History of Present Illness Pt is a 65 y.o. male who presented 09/15/21 with SOB following his HD treatment session. Pt also with R leg edema. Pt with volume overload, acute on chronic systolic CHF with bilateral pleural effusions. PMH: ESRD on MWF HD, CAD s/p CABG, chronic systolic CHF (EF 31-51% 7/61/6073), high-grade AV block s/p leadless PPM 06/09/2021, T2DM, HTN, HLD, history of CVA, s/p left BKA, anemia of chronic kidney disease    PT Comments    Pt with good participation and working to regain strength and function. Worked on laterally scooting on edge of bed in preparation for eventual lateral transfer to chair. Asked pt about his prosthetic for LLE. Pt states it had not been fitting due to edema in LLE but now it might due to reduction of that edema. Placed shrinker on lt residual limb. Pt's prosthesis is not here but hopefully when he returns to rehab he can work on getting prosthesis back on to improve mobility and function.    Recommendations for follow up therapy are one component of a multi-disciplinary discharge planning process, led by the attending physician.  Recommendations may be updated based on patient status, additional functional criteria and insurance authorization.  Follow Up Recommendations  Skilled nursing-short term rehab (<3 hours/day)     Assistance Recommended at Discharge Frequent or constant Supervision/Assistance  Equipment Recommendations  None recommended by PT    Recommendations for Other Services       Precautions / Restrictions Precautions Precautions: Fall;Other (comment) Precaution Comments: prior L BKA     Mobility  Bed Mobility Overal bed mobility: Needs Assistance Bed Mobility: Supine to Sit;Sit to Supine     Supine to sit: Min assist Sit to supine: Min assist   General bed mobility comments: Pt returning to  supine lying back laterally across bed with foot still on floor. Min assist for pt to pull up on hand to bring trunk back up into sitting    Transfers Overall transfer level: Needs assistance                 General transfer comment: Performed lateral scoot on bed 6 reps of 2' with min assist with +2 for safety    Ambulation/Gait                   Stairs             Wheelchair Mobility    Modified Rankin (Stroke Patients Only)       Balance Overall balance assessment: Needs assistance Sitting-balance support: No upper extremity supported;Feet supported Sitting balance-Leahy Scale: Fair                                      Cognition Arousal/Alertness: Awake/alert Behavior During Therapy: WFL for tasks assessed/performed   Area of Impairment: Safety/judgement                         Safety/Judgement: Decreased awareness of safety              Exercises Other Exercises Other Exercises: elbow extension with blue theraband 2 x 10 reps Other Exercises: elbow flexion with blue theraband 2 x 8 reps Other Exercises: Horizontal shoulder adduction x 5    General Comments  Pertinent Vitals/Pain Pain Assessment: Faces Faces Pain Scale: Hurts little more Pain Location: buttocks, posterior thighs at wounds Pain Descriptors / Indicators: Discomfort;Grimacing Pain Intervention(s): Limited activity within patient's tolerance;Repositioned    Home Living                          Prior Function            PT Goals (current goals can now be found in the care plan section) Acute Rehab PT Goals Patient Stated Goal: to get better Progress towards PT goals: Progressing toward goals    Frequency    Min 2X/week      PT Plan Current plan remains appropriate    Co-evaluation              AM-PAC PT "6 Clicks" Mobility   Outcome Measure  Help needed turning from your back to your side while in a  flat bed without using bedrails?: A Little Help needed moving from lying on your back to sitting on the side of a flat bed without using bedrails?: A Little Help needed moving to and from a bed to a chair (including a wheelchair)?: Total Help needed standing up from a chair using your arms (e.g., wheelchair or bedside chair)?: Total Help needed to walk in hospital room?: Total Help needed climbing 3-5 steps with a railing? : Total 6 Click Score: 10    End of Session   Activity Tolerance: Patient tolerated treatment well Patient left: in bed;with call bell/phone within reach;with family/visitor present (pt sitting EOB) Nurse Communication: Mobility status PT Visit Diagnosis: Other abnormalities of gait and mobility (R26.89);Muscle weakness (generalized) (M62.81)     Time: 5465-6812 PT Time Calculation (min) (ACUTE ONLY): 22 min  Charges:  $Therapeutic Activity: 8-22 mins                     [place] Pager [phone] Office [place] 09/22/2021, 2:08 PM

## 2021-09-22 NOTE — TOC Transition Note (Addendum)
Transition of Care [facility]) - CM/SW Discharge Note   Patient Details  Name: Jonathon Bailey MRN: [id number] Date of Birth: September 28, 1955  [facility] of [facility]) CM/SW Contact:  Bethann Berks[place], [place] Phone Number: 09/22/2021, 2:24 PM   Clinical Narrative:     Confirmed with [facility] pt can return today.   Patient will DC to: [place] date:09/22/21 Transport by: Corey Harold   Per MD patient ready for DC to [facility]. RN, patient, and facility notified of DC. Discharge Summary and FL2 sent to facility. RN to call report prior to discharge [phone]). DC packet on chart. Ambulance transport requested for patient.   CSW will sign off for now as social work intervention is no longer needed. Please consult [place] again if new needs arise.   Final next level of care: Skilled Nursing Facility Barriers to Discharge: No Barriers Identified   Patient Goals and CMS Choice Patient states their goals for this hospitalization and ongoing recovery are:: Return to [facility]      Discharge Placement              Patient chooses bed at: [facility] Patient to be transferred to facility by: [place] Name of family member notified: Pt did not want CSW to update family Patient and family notified of of transfer: 09/22/21  Discharge Plan and Services                                     Social Determinants of Health (SDOH) Interventions     Readmission Risk Interventions Readmission Risk Prevention Plan 05/06/2020  Transportation Screening Complete  PCP or Specialist Appt within 3-5 Days Complete  HRI or Home Care Consult Complete  Social Work Consult for [place] Planning/Counseling Complete  Palliative Care Screening Not Applicable  Medication Review Press photographer) Complete  Some recent data might be hidden

## 2021-09-22 NOTE — Discharge Summary (Signed)
Physician Discharge Summary  Jonathon Bailey RKY:[id number] DOB: 01/30/1956 DOA: 09/15/2021  PCP: [facility]  Admit date: 09/15/2021 Discharge date: 09/22/2021  Admitted From: SNF Discharge disposition: SNF   Recommendations for Outpatient Follow-Up:   Continue palliative care talks with patient O2 PRN- wean as able   Discharge Diagnosis:   Principal Problem:   Volume overload Active Problems:   Diabetes mellitus type 2 with complications ([place])   Coronary atherosclerosis   Anemia in chronic kidney disease, on chronic dialysis ([place])   ESRD on dialysis ([place])   Acute on chronic systolic CHF (congestive heart failure) (HCC)   Hypotension   Lactic acidosis   Thrombocytopenia (HCC)   Acute on chronic combined systolic and diastolic CHF (congestive heart failure) ([place])   Foreign body accidentally left during a procedure    Discharge Condition: Improved.  Diet recommendation: renal/carb mod  Code status: Full.   History of Present Illness:   Jonathon Bailey is a 65 y.o. 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  Patient recently admitted at [facility] 09/04/2021-09/14/2021 for dyspnea due to hypervolemia requiring serial dialysis.  He had been hypotensive and carvedilol was discontinued and he was started on low-dose midodrine.  He was noted to have rectal bleeding and underwent colonoscopy which showed large internal hemorrhoids.  Patient went to his routine dialysis session earlier today (12/23).  He says he did complete his full HD session but afterwards developed shortness of breath similar to his last admission.  He was subsequently sent to the ED.   He has had occasional cough which has been nonproductive.  He denies any chest pain, nausea, vomiting, abdominal pain.  He says he does not really make urine anymore.  He has swelling in his right leg which she says is actually decreased from his usual baseline.     Hospital Course by Problem:   Volume overload/acute  systolic congestive heart failure: Recently hospitalized for the same at [facility] dialysis here, nephrology following.  Chest  imaging was suggestive of vascular congestion, bilateral pleural effusions, abdominal free fluid, anasarca. New echo done here showed EF of less than 20%, global hypokinesis with significant septal dyskinesis, indeterminate left ventricular diastolic parameters.  We consulted cardiology,underwent cardiac cath, found to have nonischemic cardiomyopathy, EP consulted  for consideration of  biventricular ICD placement which is being planned to be done as an outpatient because of his poor functional status. Cardiomyopathy is thought to be mediated by 100%  RV pacing .  He had a pacemaker placed on 9/22. He will follow-up with EP/cardiology as an outpatient.   ESRD:  -getting HD 12/30 prior to d/c in the chair   Acute hypoxic respiratory failure/bilateral pleural effusion: Underwent right-sided thoracentesis with removal of 1.2 L of fluid on 12/24.   -wean as able to RA   Hypertension: Blood pressure still soft.  On midodrine   Lactic acidosis: Most likely from hypoperfusion in the setting of hypotension, cardiogenic shock/third spacing/volume overload.  Continue monitoring.  No indication for antibiotic treatment.  Blood cultures no growth to date,afebrile.   Insulin-dependent type 2 diabetes: Recent A1c of 7.9%.  Continue sliding scale insulin   History of  coronary artery disease: Status post CABG, stents.  No anginal symptoms.Echo as above.Cardiology was consulted and following.  Continue aspirin, lipitor   AV block: H/O  post  pacemaker placement on 9/22   Thrombocytopenia: Stable, chronic.  Without any signs of bleeding   Normocytic anemia: Patient with ESRD.  Continue to monitor.  On ESA and IV iron per nephrology   Obesity:  Estimated body mass index is 34.73 kg/m as calculated from the following:   Height as of this encounter: 5\' 11"  (1.803 m).   Weight as of this encounter: 112.9 kg.    Goals of care: Very poor prognosis due to severe systolic congestive heart failure, patient has significantly declined.  Minimal ambulation.  He lives in a skilled nursing facility.  spoke with palliative care and will need continued palliative care conversations  Dialysis access: [facility]. Refuses avf/avg  Debility/ deconditioning/ L BKA  - pt is WC bound    Medical Consultants:   Cards EP Renal Palliative care   Discharge Exam:   Vitals:   09/22/21 1056 09/22/21 1121  BP: 104/64 (!) 86/63  Pulse: 90 84  Resp: 13   Temp: (!) 97.5 F (36.4 C)   SpO2: 100%    Vitals:   09/21/21 2023 09/22/21 0340 09/22/21 1056 09/22/21 1121  BP: 94/67 (!) 86/66 104/64 (!) 86/63  Pulse: 86 90 90 84  Resp: 18 17 13    Temp: (!) 97.4 F (36.3 C)  (!) 97.5 F (36.4 C)   TempSrc: Oral  Oral   SpO2: 100% 100% 100%   Weight:      Height:        General exam: Appears calm and comfortable.   The results of significant diagnostics from this hospitalization (including imaging, microbiology, ancillary and laboratory) are listed below for reference.     Procedures and Diagnostic Studies:   DG Chest 1 View  Result Date: 09/16/2021 CLINICAL DATA:  Post thoracentesis. EXAM: CHEST  1 VIEW COMPARISON:  09/15/2021 (CT and chest x-ray) FINDINGS: Stable cardiac silhouette.  Large 4 central venous line. Interval decrease in RIGHT pleural effusion. Moderate RIGHT pleural effusion remains. No pneumothorax. LEFT effusion also noted unchanged. IMPRESSION: Mild decrease in RIGHT pleural effusion.  No pneumothorax. Electronically  Signed   By: Suzy Bouchard M.D.   On: 09/16/2021 12:43   DG Chest Port 1 View  Result Date: 09/15/2021 CLINICAL DATA:  Shortness of breath EXAM: PORTABLE CHEST 1 VIEW COMPARISON:  Chest radiograph dated December 03, 2020 FINDINGS: The heart is markedly enlarged. Pulmonary vascular congestion. Moderate bilateral pleural effusions. Dialysis catheter with distal tip in the right atrium. IMPRESSION: Cardiomegaly with pulmonary vascular congestion and moderate bilateral pleural effusions, suggesting volume overload/pulmonary edema. Left access dialysis catheter with distal tip in the right atrium. Electronically Signed   By: Keane Police D.O.   On: 09/15/2021 18:00   CT CHEST ABDOMEN PELVIS WO CONTRAST  Result Date: 09/15/2021 CLINICAL DATA:  Possible sepsis, shortness of breath and abdominal pain, initial encounter EXAM: CT CHEST, ABDOMEN AND PELVIS WITHOUT CONTRAST TECHNIQUE: Multidetector CT imaging of the chest, abdomen and pelvis was performed following the standard protocol without IV contrast. COMPARISON:  09/13/2021 FINDINGS: CT CHEST FINDINGS Cardiovascular: Heart is not significantly enlarged in size. Heavy coronary calcifications are noted. Mild atherosclerotic calcifications of the thoracic aorta are noted. Changes of prior coronary bypass grafting are seen. Left jugular dialysis catheter is noted in satisfactory position. Mediastinum/Nodes: Thoracic inlet is within normal limits. The esophagus is within  normal limits. No sizable hilar or mediastinal adenopathy is noted. Lungs/Pleura: Large bilateral pleural effusions are noted right slightly greater than left. These of increased in the interval from the prior exam. Lower lobe consolidation is noted stable from the prior study. No sizable parenchymal nodule is seen. Musculoskeletal: Degenerative changes of the thoracic spine are noted. No acute rib abnormality is seen. No compression deformity is noted. Postsurgical changes in the sternum are seen.  CT ABDOMEN PELVIS FINDINGS Hepatobiliary: No focal liver abnormality is seen. Gallbladder is decompressed but stable. Pancreas: Unremarkable. No pancreatic ductal dilatation or surrounding inflammatory changes. Spleen: Normal in size without focal abnormality. Adrenals/Urinary Tract: Adrenal glands are within normal limits. Kidneys are well visualized bilaterally. No renal calculi or obstructive changes are noted. The bladder is decompressed. Stomach/Bowel: Diverticular changes noted without evidence of diverticulitis. Previously seen postsurgical changes in the sigmoid are less well appreciated on today's exam due to contrast material throughout the sigmoid. More proximal colon is decompressed. No obstructive or inflammatory changes are seen. The appendix is within normal limits. Vascular/Lymphatic: Aortic atherosclerosis. No enlarged abdominal or pelvic lymph nodes. Reproductive: Prostate is unremarkable. Other: Mild free fluid is noted stable in appearance from the prior exam. Considerable changes of anasarca are noted within the abdominal wall slightly increased when compared with the prior exam. Musculoskeletal: Degenerative changes of lumbar spine are seen. IMPRESSION: CT of the chest: Bilateral pleural effusions right considerably greater than left and increased from the prior examination from 2 days previous. Associated lower lobe consolidation is noted. CT of the abdomen and pelvis: Diverticulosis without diverticulitis. Free fluid within the abdomen stable from the prior exam. Increasing changes of anasarca in the abdominal wall. Aortic Atherosclerosis (ICD10-I70.0). Electronically Signed   By: Inez Catalina M.D.   On: 09/15/2021 20:33   US THORACENTESIS ASP PLEURAL SPACE W/IMG GUIDE  Result Date: 09/16/2021 INDICATION: Symptomatic right-sided sided pleural effusion. Please perform ultrasound-guided thoracentesis for therapeutic purposes. EXAM: US THORACENTESIS ASP PLEURAL SPACE W/IMG GUIDE COMPARISON:   Chest radiograph-09/15/2021; chest CT-09/15/2021 MEDICATIONS: None. COMPLICATIONS: None immediate. TECHNIQUE: Informed written consent was obtained from the patient after a discussion of the risks, benefits and alternatives to treatment. A timeout was performed prior to the initiation of the procedure. Initial ultrasound scanning demonstrates a large anechoic right-sided pleural effusion. Sonographic evaluation of the left chest demonstrated a trace left-sided pleural effusion. As such, decision was made to proceed with right-sided thoracentesis. The inferolateral aspect of the right chest was prepped and draped in the usual sterile fashion. 1% lidocaine was used for local anesthesia. An ultrasound image was saved for documentation purposes. An 8 Fr Safe-T-Centesis catheter was introduced. The thoracentesis was performed. The catheter was removed and a dressing was applied. The patient tolerated the procedure well without immediate post procedural complication. The patient was escorted to have an upright chest radiograph. FINDINGS: A total of approximately 1.2 liters of serous fluid was removed. IMPRESSION: Successful ultrasound-guided right sided thoracentesis yielding 1.2 liters of pleural fluid. Electronically Signed   By: Sandi Mariscal M.D.   On: 09/16/2021 13:42     Labs:   Basic Metabolic Panel: Recent Labs  Lab 09/16/21 0427 09/16/21 1507 09/17/21 0442 09/18/21 0740 09/19/21 0519 09/19/21 1026 09/19/21 1029 09/19/21 1902 09/20/21 0316 09/21/21 0707 09/22/21 0316  NA 134* 135 134* 135 135 137   137 136  --  134* 130* 132*  K 3.9 4.2 4.2 4.3 4.2 4.5   4.5 4.5  --  5.0 4.4 4.4  CL  96* 97* 97* 96* 98  --   --   --  98 93* 98  CO2 25 25 25 26 25   --   --   --  16* 20* 22  GLUCOSE 71 107* 70 74 77  --   --   --  158* 126* 122*  BUN 13 16 17 15 11   --   --   --  18 18 26*  CREATININE 5.01* 5.44* 5.83* 5.14* 4.29*  --   --  4.68* 5.39* 4.22* 5.06*  CALCIUM 7.2* 7.3* 7.0* 6.9* 7.1*  --   --    --  7.0* 6.9* 6.9*  MG 2.1  --  2.1  --   --   --   --   --   --   --   --   PHOS  --  3.4 3.4 2.9  --   --   --   --   --   --   --    GFR Estimated Creatinine Clearance: 18.8 mL/min (A) (by C-G formula based on SCr of 5.06 mg/dL (H)). Liver Function Tests: Recent Labs  Lab 09/15/21 1826 09/16/21 0427 09/16/21 1507 09/17/21 0442 09/18/21 0740  AST 49* 54*  --   --   --   ALT 30 33  --   --   --   ALKPHOS 147* 142*  --   --   --   BILITOT 2.1* 2.0*  --   --   --   PROT 7.5 7.4  --   --   --   ALBUMIN 2.7* 2.6* 2.4* 2.2* 2.3*   Recent Labs  Lab 09/15/21 1826  LIPASE 31   No results for input(s): AMMONIA in the last 168 hours. Coagulation profile No results for input(s): INR, PROTIME in the last 168 hours.  CBC: Recent Labs  Lab 09/15/21 1826 09/16/21 0427 09/18/21 0740 09/19/21 0519 09/19/21 1026 09/19/21 1029 09/19/21 1902 09/20/21 0316 09/21/21 0707 09/22/21 0316  WBC 4.7   < > 5.6 6.0  --   --  5.0 5.6 9.4 9.0  NEUTROABS 2.5  --  2.9  --   --   --   --  4.3  --  7.2  HGB 9.3*   < > 9.6* 9.2*   < > 11.6* 9.8* 9.2* 8.7* 8.3*  HCT 30.6*   < > 29.6* 28.2*   < > 34.0* 30.7* 29.6* 26.8* 24.9*  MCV 83.6   < > 80.0 80.6  --   --  81.9 83.1 81.2 79.8*  PLT 71*   < > 77* 67*  --   --  75* 73* 70* 69*   < > = values in this interval not displayed.   Cardiac Enzymes: No results for input(s): CKTOTAL, CKMB, CKMBINDEX, TROPONINI in the last 168 hours. BNP: Invalid input(s): POCBNP CBG: Recent Labs  Lab 09/21/21 1614 09/21/21 2020 09/22/21 0055 09/22/21 0336 09/22/21 0742  GLUCAP 133* 169* 145* 122* 115*   D-Dimer No results for input(s): DDIMER in the last 72 hours. Hgb A1c No results for input(s): HGBA1C in the last 72 hours. Lipid Profile No results for input(s): CHOL, HDL, LDLCALC, TRIG, CHOLHDL, LDLDIRECT in the last 72 hours. Thyroid function studies No results for input(s): TSH, T4TOTAL, T3FREE, THYROIDAB in the last 72 hours.  Invalid input(s):  FREET3 Anemia work up No results for input(s): VITAMINB12, FOLATE, FERRITIN, TIBC, IRON, RETICCTPCT in the last 72 hours. Microbiology Recent Results (from the past 240  hour(s))  Culture, blood (routine x 2)     Status: None   Collection Time: 09/15/21  6:28 PM   Specimen: Site Not Specified; Blood  Result Value Ref Range Status   Specimen Description SITE NOT SPECIFIED  Final   Special Requests   Final    BOTTLES DRAWN AEROBIC AND ANAEROBIC Blood Culture results may not be optimal due to an excessive volume of blood received in culture bottles   Culture   Final    NO GROWTH 5 DAYS Performed at [facility], [place] [address], [place], [place] [address]    Report Status 09/20/2021 FINAL  Final  Culture, blood (routine x 2)     Status: None   Collection Time: 09/15/21  6:29 PM   Specimen: Site Not Specified; Blood  Result Value Ref Range Status   Specimen Description SITE NOT SPECIFIED  Final   Special Requests   Final    BOTTLES DRAWN AEROBIC AND ANAEROBIC Blood Culture results may not be optimal due to an excessive volume of blood received in culture bottles   Culture   Final    NO GROWTH 5 DAYS Performed at [facility], [place] [address], [place],  [address]    Report Status 09/20/2021 FINAL  Final  Resp Panel by RT-PCR (Flu A&B, Covid) Nasopharyngeal Swab     Status: None   Collection Time: 09/16/21  2:55 AM   Specimen: Nasopharyngeal Swab; Nasopharyngeal(NP) swabs in vial transport medium  Result Value Ref Range Status   SARS Coronavirus 2 by RT PCR NEGATIVE NEGATIVE Final    Comment: (NOTE) SARS-CoV-2 target nucleic acids are NOT DETECTED.  The SARS-CoV-2 RNA is generally detectable in upper respiratory specimens during the acute phase of infection. The lowest concentration of SARS-CoV-2 viral copies this assay can detect is 138 copies/mL. A negative result does not preclude SARS-Cov-2 infection and should not be used as the sole basis for treatment  or other patient management decisions. A negative result may occur with  improper specimen collection/handling, submission of specimen other than nasopharyngeal swab, presence of viral mutation(s) within the areas targeted by this assay, and inadequate number of viral copies(<138 copies/mL). A negative result must be combined with clinical observations, patient history, and epidemiological information. The expected result is Negative.  Fact Sheet for Patients:  EntrepreneurPulse.com.au  Fact Sheet for Healthcare Providers:  IncredibleEmployment.be  This test is no t yet approved or cleared by the [place] FDA and  has been authorized for detection and/or diagnosis of SARS-CoV-2 by FDA under an Emergency Use Authorization (EUA). This EUA will remain  in effect (meaning this test can be used) for the duration of the COVID-19 declaration under Section 564(b)(1) of the Act, 21 U.S.C.section 360bbb-3(b)(1), unless the authorization is terminated  or revoked sooner.       Influenza A by PCR NEGATIVE NEGATIVE Final   Influenza B by PCR NEGATIVE NEGATIVE Final    Comment: (NOTE) The Xpert Xpress SARS-CoV-2/FLU/RSV plus assay is intended as an aid in the diagnosis of influenza from Nasopharyngeal swab specimens and should not be used as a sole basis for treatment. Nasal washings and aspirates are unacceptable for Xpert Xpress SARS-CoV-2/FLU/RSV testing.  Fact Sheet for Patients: EntrepreneurPulse.com.au  Fact Sheet for Healthcare Providers: IncredibleEmployment.be  This test is not yet approved or cleared by the [place] FDA and has been authorized for detection and/or diagnosis of SARS-CoV-2 by FDA under an Emergency Use Authorization (EUA). This EUA will remain  in effect (meaning this test can be used) for the duration of the COVID-19 declaration under Section 564(b)(1) of the Act, 21 U.S.C. section  360bbb-3(b)(1), unless the authorization is terminated or revoked.  Performed at [facility], [place] [address] [place], [place] [address]      Discharge Instructions:   Discharge Instructions     Diet - low sodium heart healthy   Complete by: As directed    Discharge instructions   Complete by: As directed    Renal/carb mod   Increase activity slowly   Complete by: As directed    No wound care   Complete by: As directed       Allergies as of 09/22/2021       Reactions   Ferumoxytol Itching      Iron Itching       Iodinated Contrast Media Itching   Sildenafil Other (See Comments)   Other reaction(s): Abnormal sexual function        Medication List     STOP taking these medications    aspirin 325 MG tablet Replaced by: aspirin 81 MG EC tablet   cinacalcet 90 MG tablet Commonly known as: SENSIPAR       TAKE these medications    acetaminophen 500 MG tablet Commonly known as: TYLENOL Take 1,000 mg by mouth every 6 (six) hours as needed for headache (pain).   albuterol 0.63 MG/3ML nebulizer solution Commonly known as: ACCUNEB Take 1 ampule by nebulization every 6 (six) hours as needed for wheezing.   albuterol 108 (90 Base) MCG/ACT inhaler Commonly known as: VENTOLIN HFA Inhale 2 puffs into the lungs every 6 (six) hours as needed (COPD).   aspirin 81 MG EC tablet Take 1 tablet (81 mg total) by mouth daily. Swallow whole. Start taking on: September 23, 2021 Replaces: aspirin 325 MG tablet   atorvastatin 80 MG tablet Commonly known as: LIPITOR Take 80 mg by mouth daily after supper.   dextrose 40 % Gel Commonly known as: GLUTOSE Take 31 g by mouth as needed for low blood sugar.   doxercalciferol 4 MCG/2ML injection Commonly known as: HECTOROL Inject 1.5 mLs (3 mcg total) into the vein every Monday, Wednesday, and Friday with hemodialysis.   epoetin alfa 10000 UNIT/ML injection Commonly known as: EPOGEN Inject 10,000 Units into the  skin every 3 (three) days.   fluticasone 50 MCG/ACT nasal spray Commonly known as: FLONASE Place 1 spray into both nostrils daily.   folic acid 604 MCG tablet Commonly known as: FOLVITE Take 400 mcg by mouth every Monday, Wednesday, and Friday. Dialysis days   hydrOXYzine 25 MG tablet Commonly known as: ATARAX Take 25 mg by mouth every 6 (six) hours as needed for itching (allergies).   insulin lispro 100 UNIT/ML injection Commonly known as: HUMALOG Inject 2-12 Units into the skin See admin instructions. Inject 2-12 units subcutaneously three times daily before meals per sliding scale - CBG 201-250 2 units, 251-300 4 units, 301-350 6 units, 351-400 8 units, 401-450 10 units, >450 12 units and call MD   lactulose 10 GM/15ML solution Commonly known as: CHRONULAC Take 20 g by mouth daily as needed (constipation).   lanthanum 500 MG chewable tablet Commonly known as: FOSRENOL Chew 500 mg by mouth See admin instructions. Chew one tablet (500 mg) by mouth with the largest meal of the day   loperamide 2 MG capsule Commonly known as: IMODIUM Take 2 mg by mouth daily as needed for diarrhea or loose stools.  metoCLOPramide 5 MG tablet Commonly known as: REGLAN Take 5 mg by mouth 3 (three) times daily as needed for nausea.   midodrine 10 MG tablet Commonly known as: PROAMATINE Take 1 tablet (10 mg total) by mouth 3 (three) times daily with meals. What changed:  medication strength how much to take when to take this additional instructions   nitroGLYCERIN 0.4 MG SL tablet Commonly known as: NITROSTAT Place 0.4 mg under the tongue every 5 (five) minutes as needed for chest pain.   pantoprazole 40 MG tablet Commonly known as: PROTONIX Take 1 tablet (40 mg total) by mouth daily. What changed: when to take this   polyethylene glycol 17 g packet Commonly known as: MIRALAX / GLYCOLAX Take 17 g by mouth daily. What changed:  when to take this reasons to take this additional  instructions   tiotropium 18 MCG inhalation capsule Commonly known as: SPIRIVA Place 18 mcg into inhaler and inhale daily as needed (COPD).   Vitamin B Complex Tabs Take 1 tablet by mouth every Monday, Wednesday, and Friday. Dialysis days   vitamin C 500 MG tablet Commonly known as: ASCORBIC ACID Take 500 mg by mouth daily.        Follow-up Information     Vickie Epley, MD Follow up.   Specialties: Cardiology, Radiology Why: on 11/08/21 at 330 for post hospital pacemaker check Contact information: [place] [address] [phone]         Clinic, [place] Follow up in 1 week(s).   Contact information: [place] [address] [phone]                  Time coordinating discharge: 35 min  Signed:  Geradine Girt DO  [facility] 09/22/2021, 12:00 PM

## 2021-09-22 NOTE — Progress Notes (Signed)
Report given to Quillian Quince at [facility]

## 2021-09-22 NOTE — Progress Notes (Signed)
Mobility Specialist Progress Note:   09/22/21 1412  Mobility  Activity Transferred:  Chair to bed  Level of Assistance Total care  Assistive Device MaxiMove  Mobility Response Tolerated well  Mobility performed by Mobility specialist;Nurse tech  $Mobility charge 1 Mobility   Pt needing to get back to bed.   [facility] Public librarian Phone [phone] Secondary Phone [phone]

## 2021-09-28 ENCOUNTER — Other Ambulatory Visit: Payer: Self-pay

## 2021-09-28 ENCOUNTER — Emergency Department (HOSPITAL_COMMUNITY): Payer: No Typology Code available for payment source

## 2021-09-28 ENCOUNTER — Encounter (HOSPITAL_COMMUNITY): Payer: Self-pay | Admitting: Pulmonary Disease

## 2021-09-28 ENCOUNTER — Inpatient Hospital Stay (HOSPITAL_COMMUNITY)
Admission: EM | Admit: 2021-09-28 | Discharge: 2021-10-25 | DRG: 291 | Disposition: E | Payer: No Typology Code available for payment source | Source: Skilled Nursing Facility | Attending: Internal Medicine | Admitting: Internal Medicine

## 2021-09-28 ENCOUNTER — Encounter (HOSPITAL_COMMUNITY): Payer: Self-pay | Admitting: *Deleted

## 2021-09-28 DIAGNOSIS — I69351 Hemiplegia and hemiparesis following cerebral infarction affecting right dominant side: Secondary | ICD-10-CM | POA: Diagnosis not present

## 2021-09-28 DIAGNOSIS — R0600 Dyspnea, unspecified: Secondary | ICD-10-CM

## 2021-09-28 DIAGNOSIS — Z951 Presence of aortocoronary bypass graft: Secondary | ICD-10-CM

## 2021-09-28 DIAGNOSIS — R230 Cyanosis: Secondary | ICD-10-CM | POA: Diagnosis present

## 2021-09-28 DIAGNOSIS — Z8042 Family history of malignant neoplasm of prostate: Secondary | ICD-10-CM

## 2021-09-28 DIAGNOSIS — Z91041 Radiographic dye allergy status: Secondary | ICD-10-CM

## 2021-09-28 DIAGNOSIS — D631 Anemia in chronic kidney disease: Secondary | ICD-10-CM | POA: Diagnosis present

## 2021-09-28 DIAGNOSIS — R570 Cardiogenic shock: Secondary | ICD-10-CM | POA: Diagnosis present

## 2021-09-28 DIAGNOSIS — Z955 Presence of coronary angioplasty implant and graft: Secondary | ICD-10-CM

## 2021-09-28 DIAGNOSIS — R112 Nausea with vomiting, unspecified: Secondary | ICD-10-CM | POA: Diagnosis present

## 2021-09-28 DIAGNOSIS — Z87891 Personal history of nicotine dependence: Secondary | ICD-10-CM

## 2021-09-28 DIAGNOSIS — D61818 Other pancytopenia: Secondary | ICD-10-CM | POA: Diagnosis present

## 2021-09-28 DIAGNOSIS — L299 Pruritus, unspecified: Secondary | ICD-10-CM | POA: Diagnosis not present

## 2021-09-28 DIAGNOSIS — E8770 Fluid overload, unspecified: Secondary | ICD-10-CM | POA: Diagnosis present

## 2021-09-28 DIAGNOSIS — I5023 Acute on chronic systolic (congestive) heart failure: Secondary | ICD-10-CM | POA: Diagnosis present

## 2021-09-28 DIAGNOSIS — Z515 Encounter for palliative care: Secondary | ICD-10-CM

## 2021-09-28 DIAGNOSIS — N2581 Secondary hyperparathyroidism of renal origin: Secondary | ICD-10-CM | POA: Diagnosis present

## 2021-09-28 DIAGNOSIS — F419 Anxiety disorder, unspecified: Secondary | ICD-10-CM | POA: Diagnosis present

## 2021-09-28 DIAGNOSIS — Z6832 Body mass index (BMI) 32.0-32.9, adult: Secondary | ICD-10-CM

## 2021-09-28 DIAGNOSIS — Z833 Family history of diabetes mellitus: Secondary | ICD-10-CM

## 2021-09-28 DIAGNOSIS — R579 Shock, unspecified: Secondary | ICD-10-CM | POA: Diagnosis present

## 2021-09-28 DIAGNOSIS — I5041 Acute combined systolic (congestive) and diastolic (congestive) heart failure: Secondary | ICD-10-CM | POA: Diagnosis not present

## 2021-09-28 DIAGNOSIS — R4020 Unspecified coma: Secondary | ICD-10-CM | POA: Diagnosis not present

## 2021-09-28 DIAGNOSIS — N179 Acute kidney failure, unspecified: Secondary | ICD-10-CM | POA: Diagnosis not present

## 2021-09-28 DIAGNOSIS — I442 Atrioventricular block, complete: Secondary | ICD-10-CM | POA: Diagnosis present

## 2021-09-28 DIAGNOSIS — Z7189 Other specified counseling: Secondary | ICD-10-CM | POA: Diagnosis not present

## 2021-09-28 DIAGNOSIS — K7200 Acute and subacute hepatic failure without coma: Secondary | ICD-10-CM | POA: Diagnosis present

## 2021-09-28 DIAGNOSIS — E1151 Type 2 diabetes mellitus with diabetic peripheral angiopathy without gangrene: Secondary | ICD-10-CM | POA: Diagnosis present

## 2021-09-28 DIAGNOSIS — R197 Diarrhea, unspecified: Secondary | ICD-10-CM | POA: Diagnosis present

## 2021-09-28 DIAGNOSIS — Z823 Family history of stroke: Secondary | ICD-10-CM

## 2021-09-28 DIAGNOSIS — F32A Depression, unspecified: Secondary | ICD-10-CM | POA: Diagnosis present

## 2021-09-28 DIAGNOSIS — Z79899 Other long term (current) drug therapy: Secondary | ICD-10-CM

## 2021-09-28 DIAGNOSIS — N186 End stage renal disease: Secondary | ICD-10-CM | POA: Diagnosis present

## 2021-09-28 DIAGNOSIS — E669 Obesity, unspecified: Secondary | ICD-10-CM | POA: Diagnosis present

## 2021-09-28 DIAGNOSIS — I5082 Biventricular heart failure: Secondary | ICD-10-CM | POA: Diagnosis present

## 2021-09-28 DIAGNOSIS — M1990 Unspecified osteoarthritis, unspecified site: Secondary | ICD-10-CM | POA: Diagnosis present

## 2021-09-28 DIAGNOSIS — R519 Headache, unspecified: Secondary | ICD-10-CM | POA: Diagnosis present

## 2021-09-28 DIAGNOSIS — Z66 Do not resuscitate: Secondary | ICD-10-CM | POA: Diagnosis not present

## 2021-09-28 DIAGNOSIS — E872 Acidosis, unspecified: Secondary | ICD-10-CM | POA: Diagnosis present

## 2021-09-28 DIAGNOSIS — Z825 Family history of asthma and other chronic lower respiratory diseases: Secondary | ICD-10-CM

## 2021-09-28 DIAGNOSIS — Z20822 Contact with and (suspected) exposure to covid-19: Secondary | ICD-10-CM | POA: Diagnosis present

## 2021-09-28 DIAGNOSIS — I5084 End stage heart failure: Secondary | ICD-10-CM | POA: Diagnosis present

## 2021-09-28 DIAGNOSIS — E1165 Type 2 diabetes mellitus with hyperglycemia: Secondary | ICD-10-CM | POA: Diagnosis present

## 2021-09-28 DIAGNOSIS — I3139 Other pericardial effusion (noninflammatory): Secondary | ICD-10-CM | POA: Diagnosis present

## 2021-09-28 DIAGNOSIS — I252 Old myocardial infarction: Secondary | ICD-10-CM

## 2021-09-28 DIAGNOSIS — J9601 Acute respiratory failure with hypoxia: Secondary | ICD-10-CM | POA: Diagnosis present

## 2021-09-28 DIAGNOSIS — I447 Left bundle-branch block, unspecified: Secondary | ICD-10-CM | POA: Diagnosis present

## 2021-09-28 DIAGNOSIS — Z992 Dependence on renal dialysis: Secondary | ICD-10-CM

## 2021-09-28 DIAGNOSIS — I428 Other cardiomyopathies: Secondary | ICD-10-CM | POA: Diagnosis present

## 2021-09-28 DIAGNOSIS — Z7982 Long term (current) use of aspirin: Secondary | ICD-10-CM

## 2021-09-28 DIAGNOSIS — N19 Unspecified kidney failure: Secondary | ICD-10-CM | POA: Diagnosis present

## 2021-09-28 DIAGNOSIS — E1122 Type 2 diabetes mellitus with diabetic chronic kidney disease: Secondary | ICD-10-CM | POA: Diagnosis present

## 2021-09-28 DIAGNOSIS — E875 Hyperkalemia: Secondary | ICD-10-CM | POA: Diagnosis not present

## 2021-09-28 DIAGNOSIS — I132 Hypertensive heart and chronic kidney disease with heart failure and with stage 5 chronic kidney disease, or end stage renal disease: Secondary | ICD-10-CM | POA: Diagnosis present

## 2021-09-28 DIAGNOSIS — Z9115 Patient's noncompliance with renal dialysis: Secondary | ICD-10-CM

## 2021-09-28 DIAGNOSIS — T450X5A Adverse effect of antiallergic and antiemetic drugs, initial encounter: Secondary | ICD-10-CM | POA: Diagnosis not present

## 2021-09-28 DIAGNOSIS — I2510 Atherosclerotic heart disease of native coronary artery without angina pectoris: Secondary | ICD-10-CM | POA: Diagnosis present

## 2021-09-28 DIAGNOSIS — Z89512 Acquired absence of left leg below knee: Secondary | ICD-10-CM

## 2021-09-28 DIAGNOSIS — R627 Adult failure to thrive: Secondary | ICD-10-CM | POA: Diagnosis present

## 2021-09-28 DIAGNOSIS — Z950 Presence of cardiac pacemaker: Secondary | ICD-10-CM

## 2021-09-28 DIAGNOSIS — Z8249 Family history of ischemic heart disease and other diseases of the circulatory system: Secondary | ICD-10-CM

## 2021-09-28 DIAGNOSIS — G4730 Sleep apnea, unspecified: Secondary | ICD-10-CM | POA: Diagnosis present

## 2021-09-28 DIAGNOSIS — I130 Hypertensive heart and chronic kidney disease with heart failure and stage 1 through stage 4 chronic kidney disease, or unspecified chronic kidney disease: Secondary | ICD-10-CM | POA: Diagnosis not present

## 2021-09-28 DIAGNOSIS — E11311 Type 2 diabetes mellitus with unspecified diabetic retinopathy with macular edema: Secondary | ICD-10-CM | POA: Diagnosis present

## 2021-09-28 DIAGNOSIS — Z888 Allergy status to other drugs, medicaments and biological substances status: Secondary | ICD-10-CM

## 2021-09-28 DIAGNOSIS — Z794 Long term (current) use of insulin: Secondary | ICD-10-CM

## 2021-09-28 DIAGNOSIS — E785 Hyperlipidemia, unspecified: Secondary | ICD-10-CM | POA: Diagnosis present

## 2021-09-28 DIAGNOSIS — Z9109 Other allergy status, other than to drugs and biological substances: Secondary | ICD-10-CM

## 2021-09-28 DIAGNOSIS — Z83438 Family history of other disorder of lipoprotein metabolism and other lipidemia: Secondary | ICD-10-CM

## 2021-09-28 LAB — GLUCOSE, CAPILLARY
Glucose-Capillary: 197 mg/dL — ABNORMAL HIGH (ref 70–99)
Glucose-Capillary: 204 mg/dL — ABNORMAL HIGH (ref 70–99)
Glucose-Capillary: 144 mg/dL — ABNORMAL HIGH (ref 70–99)

## 2021-09-28 LAB — POCT I-STAT 7, (LYTES, BLD GAS, ICA,H+H)
Acid-base deficit: 2 mmol/L (ref 0.0–2.0)
Bicarbonate: 22.1 mmol/L (ref 20.0–28.0)
Calcium, Ion: 0.81 mmol/L — CL (ref 1.15–1.40)
HCT: 31 % — ABNORMAL LOW (ref 39.0–52.0)
Hemoglobin: 10.5 g/dL — ABNORMAL LOW (ref 13.0–17.0)
O2 Saturation: 100 %
Potassium: 4.2 mmol/L (ref 3.5–5.1)
Sodium: 134 mmol/L — ABNORMAL LOW (ref 135–145)
TCO2: 23 mmol/L (ref 22–32)
pCO2 arterial: 32.7 mmHg (ref 32.0–48.0)
pH, Arterial: 7.437 (ref 7.350–7.450)
pO2, Arterial: 351 mmHg — ABNORMAL HIGH (ref 83.0–108.0)

## 2021-09-28 LAB — CBC WITH DIFFERENTIAL/PLATELET
Abs Immature Granulocytes: 0.04 10*3/uL (ref 0.00–0.07)
Basophils Absolute: 0 10*3/uL (ref 0.0–0.1)
Basophils Relative: 0 %
Eosinophils Absolute: 0.1 10*3/uL (ref 0.0–0.5)
Eosinophils Relative: 1 %
HCT: 25.6 % — ABNORMAL LOW (ref 39.0–52.0)
Hemoglobin: 8.1 g/dL — ABNORMAL LOW (ref 13.0–17.0)
Immature Granulocytes: 1 %
Lymphocytes Relative: 17 %
Lymphs Abs: 1.5 10*3/uL (ref 0.7–4.0)
MCH: 24.6 pg — ABNORMAL LOW (ref 26.0–34.0)
MCHC: 31.6 g/dL (ref 30.0–36.0)
MCV: 77.8 fL — ABNORMAL LOW (ref 80.0–100.0)
Monocytes Absolute: 1.4 10*3/uL — ABNORMAL HIGH (ref 0.1–1.0)
Monocytes Relative: 17 %
Neutro Abs: 5.5 10*3/uL (ref 1.7–7.7)
Neutrophils Relative %: 64 %
Platelets: 81 10*3/uL — ABNORMAL LOW (ref 150–400)
RBC: 3.29 MIL/uL — ABNORMAL LOW (ref 4.22–5.81)
RDW: 22.6 % — ABNORMAL HIGH (ref 11.5–15.5)
Smear Review: DECREASED
WBC: 8.5 10*3/uL (ref 4.0–10.5)
nRBC: 1.4 % — ABNORMAL HIGH (ref 0.0–0.2)

## 2021-09-28 LAB — I-STAT ARTERIAL BLOOD GAS, ED
Acid-base deficit: 6 mmol/L — ABNORMAL HIGH (ref 0.0–2.0)
Bicarbonate: 18.3 mmol/L — ABNORMAL LOW (ref 20.0–28.0)
Calcium, Ion: 0.83 mmol/L — CL (ref 1.15–1.40)
HCT: 30 % — ABNORMAL LOW (ref 39.0–52.0)
Hemoglobin: 10.2 g/dL — ABNORMAL LOW (ref 13.0–17.0)
O2 Saturation: 100 %
Potassium: 3.8 mmol/L (ref 3.5–5.1)
Sodium: 134 mmol/L — ABNORMAL LOW (ref 135–145)
TCO2: 19 mmol/L — ABNORMAL LOW (ref 22–32)
pCO2 arterial: 32.3 mmHg (ref 32.0–48.0)
pH, Arterial: 7.361 (ref 7.350–7.450)
pO2, Arterial: 230 mmHg — ABNORMAL HIGH (ref 83.0–108.0)

## 2021-09-28 LAB — CORTISOL: Cortisol, Plasma: 20.3 ug/dL

## 2021-09-28 LAB — I-STAT CHEM 8, ED
BUN: 74 mg/dL — ABNORMAL HIGH (ref 8–23)
Calcium, Ion: 0.71 mmol/L — CL (ref 1.15–1.40)
Chloride: 101 mmol/L (ref 98–111)
Creatinine, Ser: 8.7 mg/dL — ABNORMAL HIGH (ref 0.61–1.24)
Glucose, Bld: 128 mg/dL — ABNORMAL HIGH (ref 70–99)
HCT: 33 % — ABNORMAL LOW (ref 39.0–52.0)
Hemoglobin: 11.2 g/dL — ABNORMAL LOW (ref 13.0–17.0)
Potassium: 6.5 mmol/L (ref 3.5–5.1)
Sodium: 131 mmol/L — ABNORMAL LOW (ref 135–145)
TCO2: 17 mmol/L — ABNORMAL LOW (ref 22–32)

## 2021-09-28 LAB — COMPREHENSIVE METABOLIC PANEL
ALT: 65 U/L — ABNORMAL HIGH (ref 0–44)
AST: 299 U/L — ABNORMAL HIGH (ref 15–41)
Albumin: 2.5 g/dL — ABNORMAL LOW (ref 3.5–5.0)
Alkaline Phosphatase: 231 U/L — ABNORMAL HIGH (ref 38–126)
Anion gap: 20 — ABNORMAL HIGH (ref 5–15)
BUN: 48 mg/dL — ABNORMAL HIGH (ref 8–23)
CO2: 14 mmol/L — ABNORMAL LOW (ref 22–32)
Calcium: 6.9 mg/dL — ABNORMAL LOW (ref 8.9–10.3)
Chloride: 96 mmol/L — ABNORMAL LOW (ref 98–111)
Creatinine, Ser: 8.57 mg/dL — ABNORMAL HIGH (ref 0.61–1.24)
GFR, Estimated: 6 mL/min — ABNORMAL LOW (ref >60)
Glucose, Bld: 134 mg/dL — ABNORMAL HIGH (ref 70–99)
Potassium: 6.6 mmol/L (ref 3.5–5.1)
Sodium: 130 mmol/L — ABNORMAL LOW (ref 135–145)
Total Bilirubin: 3.1 mg/dL — ABNORMAL HIGH (ref 0.3–1.2)
Total Protein: 7 g/dL (ref 6.5–8.1)

## 2021-09-28 LAB — RENAL FUNCTION PANEL
Albumin: 2.5 g/dL — ABNORMAL LOW (ref 3.5–5.0)
Anion gap: 21 — ABNORMAL HIGH (ref 5–15)
BUN: 52 mg/dL — ABNORMAL HIGH (ref 8–23)
CO2: 18 mmol/L — ABNORMAL LOW (ref 22–32)
Calcium: 6.8 mg/dL — ABNORMAL LOW (ref 8.9–10.3)
Chloride: 95 mmol/L — ABNORMAL LOW (ref 98–111)
Creatinine, Ser: 8.79 mg/dL — ABNORMAL HIGH (ref 0.61–1.24)
GFR, Estimated: 6 mL/min — ABNORMAL LOW (ref >60)
Glucose, Bld: 198 mg/dL — ABNORMAL HIGH (ref 70–99)
Phosphorus: 3.5 mg/dL (ref 2.5–4.6)
Potassium: 4.1 mmol/L (ref 3.5–5.1)
Sodium: 134 mmol/L — ABNORMAL LOW (ref 135–145)

## 2021-09-28 LAB — I-STAT VENOUS BLOOD GAS, ED
Acid-base deficit: 9 mmol/L — ABNORMAL HIGH (ref 0.0–2.0)
Bicarbonate: 16.9 mmol/L — ABNORMAL LOW (ref 20.0–28.0)
Calcium, Ion: 0.72 mmol/L — CL (ref 1.15–1.40)
HCT: 33 % — ABNORMAL LOW (ref 39.0–52.0)
Hemoglobin: 11.2 g/dL — ABNORMAL LOW (ref 13.0–17.0)
O2 Saturation: 98 %
Potassium: 6.7 mmol/L (ref 3.5–5.1)
Sodium: 131 mmol/L — ABNORMAL LOW (ref 135–145)
TCO2: 18 mmol/L — ABNORMAL LOW (ref 22–32)
pCO2, Ven: 34.1 mmHg — ABNORMAL LOW (ref 44.0–60.0)
pH, Ven: 7.304 (ref 7.250–7.430)
pO2, Ven: 114 mmHg — ABNORMAL HIGH (ref 32.0–45.0)

## 2021-09-28 LAB — RESP PANEL BY RT-PCR (FLU A&B, COVID) ARPGX2
Influenza A by PCR: NEGATIVE
Influenza B by PCR: NEGATIVE
SARS Coronavirus 2 by RT PCR: NEGATIVE

## 2021-09-28 LAB — BASIC METABOLIC PANEL
Anion gap: 19 — ABNORMAL HIGH (ref 5–15)
BUN: 52 mg/dL — ABNORMAL HIGH (ref 8–23)
CO2: 18 mmol/L — ABNORMAL LOW (ref 22–32)
Calcium: 6.9 mg/dL — ABNORMAL LOW (ref 8.9–10.3)
Chloride: 95 mmol/L — ABNORMAL LOW (ref 98–111)
Creatinine, Ser: 8.78 mg/dL — ABNORMAL HIGH (ref 0.61–1.24)
GFR, Estimated: 6 mL/min — ABNORMAL LOW (ref >60)
Glucose, Bld: 198 mg/dL — ABNORMAL HIGH (ref 70–99)
Potassium: 4.2 mmol/L (ref 3.5–5.1)
Sodium: 132 mmol/L — ABNORMAL LOW (ref 135–145)

## 2021-09-28 LAB — CBC
HCT: 23.9 % — ABNORMAL LOW (ref 39.0–52.0)
Hemoglobin: 7.8 g/dL — ABNORMAL LOW (ref 13.0–17.0)
MCH: 25.6 pg — ABNORMAL LOW (ref 26.0–34.0)
MCHC: 32.6 g/dL (ref 30.0–36.0)
MCV: 78.4 fL — ABNORMAL LOW (ref 80.0–100.0)
Platelets: 70 10*3/uL — ABNORMAL LOW (ref 150–400)
RBC: 3.05 MIL/uL — ABNORMAL LOW (ref 4.22–5.81)
RDW: 21.2 % — ABNORMAL HIGH (ref 11.5–15.5)
WBC: 7.7 10*3/uL (ref 4.0–10.5)
nRBC: 1.4 % — ABNORMAL HIGH (ref 0.0–0.2)

## 2021-09-28 LAB — LACTIC ACID, PLASMA
Lactic Acid, Venous: 8.6 mmol/L (ref 0.5–1.9)
Lactic Acid, Venous: 6.6 mmol/L (ref 0.5–1.9)

## 2021-09-28 LAB — PHOSPHORUS: Phosphorus: 3.6 mg/dL (ref 2.5–4.6)

## 2021-09-28 LAB — LIPASE, BLOOD: Lipase: 113 U/L — ABNORMAL HIGH (ref 11–51)

## 2021-09-28 LAB — APTT: aPTT: 46 seconds — ABNORMAL HIGH (ref 24–36)

## 2021-09-28 LAB — PROCALCITONIN: Procalcitonin: 2.21 ng/mL

## 2021-09-28 LAB — PROTIME-INR
INR: 1.7 — ABNORMAL HIGH (ref 0.8–1.2)
Prothrombin Time: 19.5 seconds — ABNORMAL HIGH (ref 11.4–15.2)

## 2021-09-28 LAB — MAGNESIUM: Magnesium: 2.3 mg/dL (ref 1.7–2.4)

## 2021-09-28 MED ORDER — PANTOPRAZOLE SODIUM 40 MG IV SOLR
40.0000 mg | Freq: Every day | INTRAVENOUS | Status: DC
  Administered 2021-09-28: 40 mg via INTRAVENOUS
  Filled 2021-09-28: qty 40

## 2021-09-28 MED ORDER — DOBUTAMINE IN D5W 4-5 MG/ML-% IV SOLN
2.5000 ug/kg/min | INTRAVENOUS | Status: DC
  Administered 2021-09-28: 5 ug/kg/min via INTRAVENOUS
  Administered 2021-09-29: 10 ug/kg/min via INTRAVENOUS
  Administered 2021-09-29: 20 ug/kg/min via INTRAVENOUS
  Filled 2021-09-28 (×6): qty 250

## 2021-09-28 MED ORDER — PRISMASOL BGK 4/2.5 32-4-2.5 MEQ/L EC SOLN
Status: DC
  Filled 2021-09-28 (×17): qty 5000

## 2021-09-28 MED ORDER — NOREPINEPHRINE 16 MG/250ML-% IV SOLN
0.0000 ug/min | INTRAVENOUS | Status: DC
  Administered 2021-09-28: 30 ug/min via INTRAVENOUS
  Administered 2021-09-29: 100 ug/min via INTRAVENOUS
  Administered 2021-09-29: 34 ug/min via INTRAVENOUS
  Administered 2021-09-29 (×3): 100 ug/min via INTRAVENOUS
  Filled 2021-09-28 (×2): qty 250
  Filled 2021-09-28: qty 500
  Filled 2021-09-28: qty 250

## 2021-09-28 MED ORDER — DEXTROSE 50 % IV SOLN
1.0000 | Freq: Once | INTRAVENOUS | Status: AC
  Administered 2021-09-28: 50 mL via INTRAVENOUS
  Filled 2021-09-28: qty 50

## 2021-09-28 MED ORDER — PRISMASOL BGK 4/2.5 32-4-2.5 MEQ/L REPLACEMENT SOLN
Status: DC
  Filled 2021-09-28 (×7): qty 5000

## 2021-09-28 MED ORDER — SODIUM BICARBONATE 8.4 % IV SOLN
100.0000 meq | Freq: Once | INTRAVENOUS | Status: AC
  Administered 2021-09-28: 100 meq via INTRAVENOUS

## 2021-09-28 MED ORDER — SODIUM CHLORIDE 0.9 % IV SOLN: INTRAVENOUS | Status: DC | PRN

## 2021-09-28 MED ORDER — INSULIN ASPART 100 UNIT/ML IV SOLN
5.0000 [IU] | Freq: Once | INTRAVENOUS | Status: AC
  Administered 2021-09-28: 5 [IU] via INTRAVENOUS

## 2021-09-28 MED ORDER — DARBEPOETIN ALFA 200 MCG/0.4ML IJ SOSY
200.0000 ug | PREFILLED_SYRINGE | INTRAMUSCULAR | Status: DC
  Filled 2021-09-28: qty 0.4

## 2021-09-28 MED ORDER — POLYETHYLENE GLYCOL 3350 17 G PO PACK: 17.0000 g | PACK | Freq: Every day | ORAL | Status: DC | PRN

## 2021-09-28 MED ORDER — NOREPINEPHRINE 4 MG/250ML-% IV SOLN
0.0000 ug/min | INTRAVENOUS | Status: DC
  Administered 2021-09-28: 30 ug/min via INTRAVENOUS
  Administered 2021-09-28: 10 ug/min via INTRAVENOUS
  Administered 2021-09-28: 20 ug/min via INTRAVENOUS
  Filled 2021-09-28 (×5): qty 250

## 2021-09-28 MED ORDER — CHLORHEXIDINE GLUCONATE CLOTH 2 % EX PADS
6.0000 | MEDICATED_PAD | Freq: Every day | CUTANEOUS | Status: DC
  Administered 2021-09-28: 6 via TOPICAL

## 2021-09-28 MED ORDER — DIPHENHYDRAMINE HCL 50 MG/ML IJ SOLN: 12.5000 mg | Freq: Four times a day (QID) | INTRAMUSCULAR | Status: DC | PRN

## 2021-09-28 MED ORDER — CAMPHOR-MENTHOL 0.5-0.5 % EX LOTN
TOPICAL_LOTION | CUTANEOUS | Status: DC | PRN
  Filled 2021-09-28: qty 222

## 2021-09-28 MED ORDER — DOCUSATE SODIUM 100 MG PO CAPS: 100.0000 mg | ORAL_CAPSULE | Freq: Two times a day (BID) | ORAL | Status: DC | PRN

## 2021-09-28 MED ORDER — SODIUM CHLORIDE 0.9 % IV SOLN: INTRAVENOUS | Status: DC

## 2021-09-28 MED ORDER — DIPHENHYDRAMINE HCL 50 MG/ML IJ SOLN
12.5000 mg | Freq: Once | INTRAMUSCULAR | Status: AC
  Administered 2021-09-28: 12.5 mg via INTRAVENOUS
  Filled 2021-09-28: qty 1

## 2021-09-28 MED ORDER — DIPHENHYDRAMINE HCL 50 MG/ML IJ SOLN
12.5000 mg | Freq: Four times a day (QID) | INTRAMUSCULAR | Status: DC | PRN
  Administered 2021-09-28: 12.5 mg via INTRAVENOUS
  Filled 2021-09-28: qty 1

## 2021-09-28 MED ORDER — HEPARIN SODIUM (PORCINE) 1000 UNIT/ML DIALYSIS
1000.0000 [IU] | INTRAMUSCULAR | Status: DC | PRN
  Administered 2021-09-29: 4000 [IU] via INTRAVENOUS_CENTRAL
  Filled 2021-09-28: qty 2
  Filled 2021-09-28 (×2): qty 6
  Filled 2021-09-28: qty 3

## 2021-09-28 MED ORDER — CALCIUM GLUCONATE-NACL 1-0.675 GM/50ML-% IV SOLN
1.0000 g | Freq: Once | INTRAVENOUS | Status: AC
  Administered 2021-09-28: 1000 mg via INTRAVENOUS
  Filled 2021-09-28: qty 50

## 2021-09-28 MED ORDER — SODIUM CHLORIDE 0.9 % IV SOLN
8.0000 mg | Freq: Once | INTRAVENOUS | Status: AC
  Administered 2021-09-28: 8 mg via INTRAVENOUS

## 2021-09-28 MED ORDER — PRISMASOL BGK 4/2.5 32-4-2.5 MEQ/L REPLACEMENT SOLN
Status: DC
  Filled 2021-09-28 (×6): qty 5000

## 2021-09-28 MED ORDER — INSULIN ASPART 100 UNIT/ML IJ SOLN
0.0000 [IU] | INTRAMUSCULAR | Status: DC
  Administered 2021-09-28: 2 [IU] via SUBCUTANEOUS
  Administered 2021-09-28: 1 [IU] via SUBCUTANEOUS

## 2021-09-28 NOTE — Consult Note (Signed)
CARDIOLOGY CONSULT NOTE  Patient ID: Jonathon Bailey MRN: [id number] DOB/AGE: 65-Feb-1957 65 y.o.  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  Work-up during that admission showed EF <20%, which was sharp drop from EF 45-50% in 05/2021.  It was felt that his cardiomyopathy was likely due to RV pacing after insertion of leadless pacemaker for complete heart block in 05/2021 at [place] were consulted at that time.  Given access issues and acute heart failure, he was recommended outpatient follow-up for consideration for CRT-P.  It appears the patient missed dialysis for last 6 days after his discharge to nursing home, as he states he felt unwell and had diarrhea.  Today, patient presented to [facility], ER with shortness of breath.  He was found to be hypotensive with systolic blood pressure in 60 mmHg.  His potassium was elevated at 6.7.   I discussed the patient with ER physician Dr. Matilde Sprang and recommended starting norepinephrine and dobutamine. On my arrival, patient was already on norepinephrine 40 mics, and dobutamine 5 mics.  He was on nonrebreather mask, still in respiratory distress.  Blood pressure improved to 120/69 mmHg on norepinephrine and dobutamine. Past Medical History:  Diagnosis Date   Anxiety    Arthritis    Asthma    "as a child"   CAD (coronary artery disease)    Chronic systolic heart failure ([place]) 09/21/2008   ECHO Feb 2013 showed LVEF low normal at 50-55%,  +hypokinetic anterolateral wall and inferolateral wall.     CKD (chronic kidney disease) stage 4, GFR 15-29 ml/min (HCC) 08/30/2009   Progressive renal failure since 2008, creatinine 1.2 in 2008 up to 3.5 in 2012 and 3.2-5.0 in 2013. All UA's 2011-13 showed >300 protein on dipstick. Work-up in May 2011 showed negative Urine IFE and SPEP, ultrasound showed 12-13 cm kidneys with increased echogenicity and UPC ratio was 1.5 gm proteinuria.  Hgb A1C's from 2011 to 2013 were all between 9-11.  Patient saw Dr. Donnetta Hutching (vasc surgery) for HD access in Aug 2013 > vein mapping was done and Dr. Donnetta Hutching felt the left arm (pt is R handed) was suitable for L arm Cimino radiocephalic fistula. Patient said he wasn't ready to consider doing dialysis and declined the surgery.      Depression    Diabetic retinopathy ([place])    ESRD (end stage renal disease) on dialysis ([place])    Headache(784.0)    "maybe monthly" (03/15/2014)   Hyperlipidemia    Hypertension    Macular edema    Myocardial infarction [facility])    status post MI x2 and 3 stents placed in 2003   Obesity    Sleep apnea    does not use CPAP   Stroke ([place]) ~ 2007; ~1987   "weak on right side; messed w/right side of brain; cry all the time"   Type II diabetes mellitus (HCC)    insulin dependent     Past Surgical History:  Procedure Laterality Date   ABDOMINAL AORTOGRAM W/LOWER EXTREMITY N/A 04/29/2020  Procedure: ABDOMINAL AORTOGRAM W/LOWER EXTREMITY;  Surgeon: Angelia Mould, MD;  Location: [place] CV LAB;  Service: Cardiovascular;  Laterality: N/A;   AMPUTATION Left 05/02/2020   Procedure: AMPUTATION BELOW KNEE;  Surgeon: Rosetta Posner, MD;  Location: [place];  Service: Vascular;  Laterality: Left;   AV FISTULA PLACEMENT  10/17/2012   Procedure: ARTERIOVENOUS (AV) FISTULA CREATION;  Surgeon: Mal Misty, MD;  Location: [place];  Service: Vascular;  Laterality: Left;   BONE EXOSTOSIS EXCISION Right 06/04/2019   Procedure: EXOSTOSIS EXCISION;   Surgeon: Leanora Cover, MD;  Location: [place];  Service: Orthopedics;  Laterality: Right;   CAPD INSERTION N/A 03/15/2014   Procedure: LAPAROSCOPIC INSERTION CONTINUOUS AMBULATORY PERITONEAL DIALYSIS CATHETER, LAPARASCOPIC INCISIONAL HERNIA  REPAIR  WITH MESH, OMENTOPEXY AND LYSIS OF ADHESIONS;  Surgeon: [name], MD;  Location: [place];  Service: General;  Laterality: N/A;   CORONARY ANGIOPLASTY WITH STENT PLACEMENT  ~ 2002   "3"   CORONARY ANGIOPLASTY WITH STENT PLACEMENT  01/24/2004   successful PCI/stenting RCA  drug eluting cypher stent   CORONARY ANGIOPLASTY WITH STENT PLACEMENT  01/28/2004   successful stentin of a large bifurcation marginal branch of the ramus intermediate vessel   FLEXIBLE SIGMOIDOSCOPY N/A 09/05/2014   Procedure: FLEXIBLE SIGMOIDOSCOPY;  Surgeon: Cleotis Nipper, MD;  Location: [place];  Service: Endoscopy;  Laterality: N/A;   FOREIGN BODY RETRIEVAL N/A 09/19/2021   Procedure: FOREIGN BODY RETRIEVAL;  Surgeon: Adrian Prows, MD;  Location: [place] CV [facility];  Service: Cardiovascular;  Laterality: N/A;   HERNIA REPAIR     INCISION AND DRAINAGE ABSCESS N/A 09/06/2014   Procedure: REMOVAL OF PD CATH;  Surgeon: Coralie Keens, MD;  Location: [place];  Service: General;  Laterality: N/A;   [place]  03/15/2014   LAPAROSCOPIC LYSIS OF ADHESIONS  03/15/2014   NM MYOCAR PERF WALL MOTION  02/06/2012   normal perfusion scan   PERITONEAL CATHETER INSERTION  03/15/2014   REFRACTIVE SURGERY Bilateral    RIGHT/LEFT HEART CATH AND CORONARY/GRAFT ANGIOGRAPHY N/A 09/19/2021   Procedure: RIGHT/LEFT HEART CATH AND CORONARY/GRAFT ANGIOGRAPHY;  Surgeon: Adrian Prows, MD;  Location: [place] [facility];  Service: Cardiovascular;  Laterality: N/A;   SHUNTOGRAM N/A 03/11/2013   Procedure: Fistulogram;  Surgeon: Conrad Raubsville, MD;  Location: [facility] CATH LAB;  Service: Cardiovascular;  Laterality: N/A;   TENDON TRANSFER Right 06/04/2019   Procedure: RIGHT HAND  EXTENSOR TENDON TRANSFER TO SMALL FINGER;  Surgeon: Leanora Cover, MD;  Location: [place];  Service: Orthopedics;  Laterality: Right;   UMBILICAL HERNIA REPAIR        Family History  Problem Relation Age of Onset   Asthma Mother    Hyperlipidemia Mother    Hypertension Mother    Stroke Father    Heart attack Father    Prostate cancer Father    Deep vein thrombosis Father    Cancer Father    Diabetes Father    Hyperlipidemia Father    Hypertension Father    Other Father        varicose veins   Heart disease Father        before age 41   Other Sister        varicose veins     Social History: Social History   Socioeconomic History   Marital status: Divorced    Spouse name: Not on file   Number of children: Not on file   Years of  education: Not on file   Highest education level: Not on file  Occupational History   Not on file  Tobacco Use   Smoking status: Former    Packs/day: 1.00    Years: 15.00    Pack years: 15.00    Types: Cigarettes    Quit date: 09/24/1986    Years since quitting: 35.0   Smokeless tobacco: Never  Vaping Use   Vaping Use: Never used  Substance and Sexual Activity   Alcohol use: Not Currently    Comment: stopped in 2013   Drug use: No   Sexual activity: Not Currently  Other Topics Concern   Not on file  Social History Narrative   Not on file   Social Determinants of Health   Financial Resource Strain: Not on file  Food Insecurity: Not on file  Transportation Needs: Not on file  Physical Activity: Not on file  Stress: Not on file  Social Connections: Not on file  Intimate Partner Violence: Not on file     (Not in a hospital admission)   Review of Systems  Constitutional: Positive for malaise/fatigue. Negative for decreased appetite, weight gain and weight loss.  HENT:  Negative for congestion.   Eyes:  Negative for visual disturbance.  Cardiovascular:  Positive for dyspnea on exertion. Negative for chest pain,  leg swelling, palpitations and syncope.  Respiratory:  Positive for shortness of breath. Negative for cough.   Endocrine: Negative for cold intolerance.  Hematologic/Lymphatic: Does not bruise/bleed easily.  Skin:  Negative for itching and rash.  Musculoskeletal:  Negative for myalgias.  Gastrointestinal:  Negative for abdominal pain, nausea and vomiting.  Genitourinary:  Negative for dysuria.  Neurological:  Negative for dizziness and weakness.  Psychiatric/Behavioral:  The patient is not nervous/anxious.   All other systems reviewed and are negative.    Physical Exam: Physical Exam Vitals and nursing note reviewed.  Constitutional:      General: He is not in acute distress.    Appearance: He is well-developed.  HENT:     Head: Normocephalic and atraumatic.  Eyes:     Conjunctiva/sclera: Conjunctivae normal.     Pupils: Pupils are equal, round, and reactive to light.  Neck:     Vascular: No JVD.  Cardiovascular:     Rate and Rhythm: Regular rhythm. Tachycardia present.     Pulses: Intact distal pulses.     Heart sounds: Murmur heard.  Pulmonary:     Effort: Respiratory distress present.     Breath sounds: Normal breath sounds. Decreased air movement present. No wheezing or rales.  Abdominal:     General: Bowel sounds are normal.     Palpations: Abdomen is soft.     Tenderness: There is no rebound.  Musculoskeletal:        General: No tenderness. Normal range of motion.     Right lower leg: Edema (2+) present.     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  Lymphadenopathy:     Cervical: No cervical adenopathy.  Skin:    General: Skin is warm and dry.  Neurological:     Mental Status: He is alert and oriented to person, place, and time.     Cranial Nerves: No cranial nerve deficit.     Labs:   Lab Results  Component Value Date   WBC 9.0 09/22/2021   HGB 10.2 (L) 09/24/2021   HCT 30.0 (L) 10/14/2021   MCV 79.8 (L) 09/22/2021   PLT 69 (L)  09/22/2021    Recent Labs  Lab 10/15/2021 0644 10/16/2021 0651 09/27/2021 0658 10/23/2021 0831  NA 130* 131*   < > 134*  K 6.6* 6.5*   < > 3.8  CL 96* 101  --   --   CO2 14*  --   --   --   BUN 48* 74*  --   --   CREATININE 8.57* 8.70*  --   --   CALCIUM 6.9*  --   --   --   PROT 7.0  --   --   --   BILITOT 3.1*  --   --   --   ALKPHOS 231*  --   --   --   ALT 65*  --   --   --   AST 299*  --   --   --   GLUCOSE 134* 128*  --   --    < > = values in this interval not displayed.    Lipid Panel     Component Value Date/Time   CHOL 142 04/29/2020 0428   TRIG 177 (H) 04/29/2020 0428   HDL 28 (L) 04/29/2020 0428   CHOLHDL 5.1 04/29/2020 0428   VLDL 35 04/29/2020 0428   LDLCALC 79 04/29/2020 0428    BNP (last 3 results) No results for input(s): BNP in the last 8760 hours.  HEMOGLOBIN A1C Lab Results  Component Value Date   HGBA1C 5.5 09/17/2021   MPG 111.15 09/17/2021    Cardiac Panel (last 3 results) No results for input(s): CKTOTAL, CKMB, RELINDX in the last 8760 hours.  Invalid input(s): TROPONINHS  Lab Results  Component Value Date   DQQIWLN 989 (H) 11/13/2011   CKMB 8.1 (HH) 11/13/2011     TSH No results for input(s): TSH in the last 8760 hours.    Radiology: DG Chest Portable 1 View  Result Date: 10/24/2021 CLINICAL DATA:  Shortness of breath, unresponsive, apnea and hypotension. Status post right thoracentesis on 09/16/2021 EXAM: PORTABLE CHEST 1 VIEW COMPARISON:  09/16/2021 FINDINGS: Stable cardiac enlargement and positioning left jugular tunneled dialysis catheter. Since the post thoracentesis x-ray there has been some reaccumulation of right-sided pleural fluid with a moderate pleural effusion present. No significant left-sided pleural fluid. Probable mild underlying chronic pulmonary interstitial edema. No significant focal airspace consolidation. IMPRESSION: Reaccumulation of right-sided pleural fluid since recent thoracentesis with moderate pleural  effusion present. Probable mild underlying chronic pulmonary interstitial edema. Electronically Signed   By: Aletta Edouard M.D.   On: 09/24/2021 07:47    Scheduled Meds:  pantoprazole (PROTONIX) IV  40 mg Intravenous QHS   Continuous Infusions:  sodium chloride     DOBUTamine 5 mcg/kg/min (10/19/2021 0741)   norepinephrine (LEVOPHED) Adult infusion 20 mcg/min (10/12/2021 0705)   PRN Meds:.docusate sodium, polyethylene glycol  CARDIAC STUDIES:  EKG 10/21/2021: Ventricular paced rhythm 95 bpm  RHC/LHC 09/19/2021: Hemodynamics: RA 25/34, mean 27 mmHg.  RA saturation 63%. RV 41/15, EDP 25 mmHg. PA 45/28, mean 36 mmHg.  PA saturation 56%.  Aortic saturation 95%. CO 7.52, CI 3.32.  SVR elevated at 1179. LV 100/19, EDP 29 mmHg.  Ao 89/63, mean 76 mmHg.  No pressure gradient across the aortic valve.   Angiographic data: LM: Occluded. LAD: Supplied by LIMA to LAD which is widely patent. CX: Supplied by SVG to OM1 which is widely patent. RCA: Occluded in the proximal segment.  Supplied by SVG to RCA.  RCA small.  Recommendation: Patient has nonischemic cardiomyopathy.  Could  be induced by asynchronous AV pacing and hence may need leadless pacemaker explantation and BiV ICD placement consideration.  Echocardiogram 09/17/2021:  1. Left ventricular ejection fraction, by estimation, is <20%. The left  ventricle has severely decreased function. The left ventricle demonstrates  global hypokinesis with significant septal dyskinesis. There is mild left  ventricular hypertrophy. Left ventricular diastolic parameters are indeterminate. Elevated left ventricular end-diastolic pressure.   2. Right ventricular systolic function is moderately reduced. The right  ventricular size is moderately enlarged. There is mildly elevated  pulmonary artery systolic pressure. The estimated right ventricular  systolic pressure is 67.1 mmHg.   3. Left atrial size was mildly dilated.   4. There is a trivial  pericardial effusion posterior to the left  ventricle. Left pleural effusion present.   5. The mitral valve is abnormal. Mild mitral valve regurgitation.  Moderate mitral annular calcification.   6. Tricuspid valve regurgitation is severe.   7. The aortic valve is tricuspid. There is severe calcifcation of the  aortic valve. Aortic valve regurgitation is not visualized. Possible  severe low gradient aortic valve stenosis versus pseudo-stenosis in the  setting of markedly reduced LVEF. Aortic valve mean gradient measures 5.0 mmHg. Dimentionless index 0.31.   8. The inferior vena cava is dilated in size with <50% respiratory  variability, suggesting right atrial pressure of 15 mmHg.    Echocardiogram 06/09/2021: The left ventricular size is normal with normal left ventricular wall  thickness.  Left ventricular systolic function is mildly reduced; LVEF = 40-45%.  Abnormal (paradoxical) septal motion consistent with LBBB.  The right ventricle is moderately dilated. The right ventricular  systolic function is mildly reduced.  There is overall probably moderate calcific aortic stenosis; peak  velocity 2.2 m/s, mean gradient 9 mmHg, calculated AVA ?? Severe functional tricuspid regurgitation due to RV dilation.  Unable to compare to 2015 but by report, the LVEF is lower, the RV is  dilated, and the TR is now severe.    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 Medical treatment at baseline limited due to hypotension and  ESRD. Currently, encouraging shock with lactic acid elevation, and hypotension requiring norepinephrine and dobutamine. Unfortunately, he is not a good candidate for any advanced heart failure treatment options. Overall prognosis is guarded. Continue dobutamine and norepinephrine, as tolerated.  Hyperkalemia, acute hypoxic respiratory failure: Likely due to missed dialysis, in addition to acute systolic heart failure. CRRT as per nephrology recommendations, as tolerated Management of respiratory failure as per CCM. Patient was on nonrebreather mask this morning, now on BiPAP.  CAD: S/p CABG. Patent grafts on cath 09/19/2021.  CRITICAL CARE Performed by: Vernell Leep   Total critical care time: 40 minutes   Critical care time was exclusive of separately billable procedures and treating other patients.   Critical care was necessary to treat or prevent imminent or life-threatening deterioration.   Critical care was time spent personally by me on the following activities: development of treatment plan with patient and/or surrogate as well as nursing, discussions with consultants, evaluation of patient's response to treatment, examination of patient,  obtaining history from patient or surrogate, ordering and performing treatments and interventions, ordering and review of laboratory studies, ordering and review of radiographic studies, pulse oximetry and re-evaluation of patient's condition.       Nigel Mormon, MD Pager: [phone] Office: [phone]

## 2021-09-28 NOTE — Consult Note (Addendum)
[facility] Renal Consultation Note    Indication for Consultation:  Management of ESRD/hemodialysis; anemia, hypertension/volume and secondary hyperparathyroidism PCP: [place] Nephrologist: Dr. Rodman Key  HPI: HPI: Jonathon Bailey is a 66 y.o. male with ESRD on dialysis at [facility], now on MWF schedule. PMH: ESRD D/T  FSGS, HFrEF, CAD S/P CABG, high grade AVB S/P leadless PPM. AS/severe TR, HTN, obesity, DMT2. Recent Admission/discharge from [facility] 12/23-12/30/2022 with volume overload D/T noncompliance with dialysis, EF <20% nonischemic cardiomyopathy mediated by 100% RV pacing. Plans were made to follow up EP for consideration of BiV ICD placement. He was discharged to SNF, never returned to HD unit for dialysis.   Patient returned to ED this AM with SOB. Was very lethargic upon arrival with brief interval of being unresponsive and apneic.  Noted to be hypotensive,minimally responsive. K+ 6.6 hemolyzed. Na 130 CO2 14 SCr 8.57 BUN 48. Initial AGAS pH 7.30 PCO2 34 Po2 114/ He was started on BIPAP BP 85/45. Levophed started,currently at 20 mcg/min. Dobutamine at 5 mcg/kg/min. He has been seen by PCM. Plans to admit to ICU for multifactorial shock. We will start CRRT upon arrival to ICU.   Seen in ED. Minimally responsive. Mumbles in response to questions. Unable to obtain HPI from patient. HPI gathered from EMR and ED staff. He has generalized anasarca present, LE, hips, abdomen, sides of chest. Paced rhythm on monitor.    Past Medical History:  Diagnosis Date   Anxiety    Arthritis    Asthma    "as a child"   CAD (coronary artery disease)    Chronic systolic heart failure ([place]) 09/21/2008   ECHO Feb 2013 showed LVEF low normal at 50-55%, +hypokinetic anterolateral wall and inferolateral wall.     CKD (chronic kidney disease) stage 4, GFR 15-29 ml/min (HCC) 08/30/2009   Progressive renal failure since 2008, creatinine 1.2 in 2008 up to 3.5 in 2012  and 3.2-5.0 in 2013. All UA's 2011-13 showed >300 protein on dipstick. Work-up in May 2011 showed negative Urine IFE and SPEP, ultrasound showed 12-13 cm kidneys with increased echogenicity and UPC ratio was 1.5 gm proteinuria.  Hgb A1C's from 2011 to 2013 were all between 9-11.  Patient saw Dr. Donnetta Hutching (vasc surgery) for HD access in Aug 2013 > vein mapping was done and Dr. Donnetta Hutching felt the left arm (pt is R handed) was suitable for L arm Cimino radiocephalic fistula. Patient said he wasn't ready to consider doing dialysis and declined the surgery.      Depression    Diabetic retinopathy ([place])    ESRD (end stage renal disease) on dialysis ([place])    Headache(784.0)    "maybe monthly" (03/15/2014)   Hyperlipidemia    Hypertension    Macular edema    Myocardial infarction [facility])    status post MI x2 and 3 stents placed in 2003   Obesity    Sleep apnea    does not use CPAP   Stroke ([place]) ~ 2007; ~1987   "weak on right side; messed w/right side of brain; cry all the time"   Type II diabetes mellitus ([place])    insulin dependent   Past Surgical History:  Procedure Laterality Date   ABDOMINAL AORTOGRAM W/LOWER EXTREMITY N/A 04/29/2020   Procedure: ABDOMINAL AORTOGRAM W/LOWER EXTREMITY;  Surgeon: Angelia Mould, MD;  Location: [place] CV LAB;  Service: Cardiovascular;  Laterality: N/A;   AMPUTATION Left 05/02/2020   Procedure: AMPUTATION BELOW KNEE;  Surgeon:  Rosetta Posner, MD;  Location: [facility] OR;  Service: Vascular;  Laterality: Left;   AV FISTULA PLACEMENT  10/17/2012   Procedure: ARTERIOVENOUS (AV) FISTULA CREATION;  Surgeon: Mal Misty, MD;  Location: [place];  Service: Vascular;  Laterality: Left;   BONE EXOSTOSIS EXCISION Right 06/04/2019   Procedure: EXOSTOSIS EXCISION;  Surgeon: Leanora Cover, MD;  Location: [place];  Service: Orthopedics;  Laterality: Right;   CAPD INSERTION N/A 03/15/2014   Procedure: LAPAROSCOPIC INSERTION CONTINUOUS AMBULATORY PERITONEAL DIALYSIS  CATHETER, LAPARASCOPIC INCISIONAL HERNIA  REPAIR  WITH MESH, OMENTOPEXY AND LYSIS OF ADHESIONS;  Surgeon: [name], MD;  Location: [place];  Service: General;  Laterality: N/A;   CORONARY ANGIOPLASTY WITH STENT PLACEMENT  ~ 2002   "3"   CORONARY ANGIOPLASTY WITH STENT PLACEMENT  01/24/2004   successful PCI/stenting RCA  drug eluting cypher stent   CORONARY ANGIOPLASTY WITH STENT PLACEMENT  01/28/2004   successful stentin of a large bifurcation marginal branch of the ramus intermediate vessel   FLEXIBLE SIGMOIDOSCOPY N/A 09/05/2014   Procedure: FLEXIBLE SIGMOIDOSCOPY;  Surgeon: Cleotis Nipper, MD;  Location: [place];  Service: Endoscopy;  Laterality: N/A;   FOREIGN BODY RETRIEVAL N/A 09/19/2021   Procedure: FOREIGN BODY RETRIEVAL;  Surgeon: Adrian Prows, MD;  Location: [place] CV LAB;  Service: Cardiovascular;  Laterality: N/A;   HERNIA REPAIR     INCISION AND DRAINAGE ABSCESS N/A 09/06/2014   Procedure: REMOVAL OF PD CATH;  Surgeon: Coralie Keens, MD;  Location: [place];  Service: General;  Laterality: N/A;   [place]  03/15/2014   LAPAROSCOPIC LYSIS OF ADHESIONS  03/15/2014   NM MYOCAR PERF WALL MOTION  02/06/2012   normal perfusion scan   PERITONEAL CATHETER INSERTION  03/15/2014   REFRACTIVE SURGERY Bilateral    RIGHT/LEFT HEART CATH AND CORONARY/GRAFT ANGIOGRAPHY N/A 09/19/2021   Procedure: RIGHT/LEFT HEART CATH AND CORONARY/GRAFT ANGIOGRAPHY;  Surgeon: Adrian Prows, MD;  Location: [place] CV LAB;  Service: Cardiovascular;  Laterality: N/A;   SHUNTOGRAM N/A 03/11/2013   Procedure: Fistulogram;  Surgeon: Conrad [place], MD;  Location: [facility] CATH LAB;  Service: Cardiovascular;  Laterality: N/A;   TENDON TRANSFER Right 06/04/2019   Procedure: RIGHT HAND EXTENSOR TENDON TRANSFER TO SMALL FINGER;  Surgeon: Leanora Cover, MD;  Location: [place];  Service: Orthopedics;  Laterality: Right;   UMBILICAL HERNIA REPAIR     Family History  Problem Relation  Age of Onset   Asthma Mother    Hyperlipidemia Mother    Hypertension Mother    Stroke Father    Heart attack Father    Prostate cancer Father    Deep vein thrombosis Father    Cancer Father    Diabetes Father    Hyperlipidemia Father    Hypertension Father    Other Father        varicose veins   Heart disease Father        before age 56   Other Sister        varicose veins   Social History:  reports that he quit smoking about 35 years ago. His smoking use included cigarettes. He has a 15.00 pack-year smoking history. He has never used smokeless tobacco. He reports that he does not currently use alcohol. He reports that he does not use drugs. Allergies  Allergen Reactions   Ferumoxytol Itching        Iron Itching         Iodinated Contrast  Media Itching   Sildenafil Other (See Comments)    Other reaction(s): Abnormal sexual function   Prior to Admission medications   Medication Sig Start Date End Date Taking? Authorizing Provider  acetaminophen (TYLENOL) 500 MG tablet Take 1,000 mg by mouth every 6 (six) hours as needed for mild pain.   Yes [provider]  albuterol (ACCUNEB) 0.63 MG/3ML nebulizer solution Take 1 ampule by nebulization every 6 (six) hours as needed for wheezing.   Yes [provider]  albuterol (PROVENTIL HFA;VENTOLIN HFA) 108 (90 BASE) MCG/ACT inhaler Inhale 2 puffs into the lungs every 6 (six) hours as needed (COPD).   Yes [provider]  aspirin EC 81 MG EC tablet Take 1 tablet (81 mg total) by mouth daily. Swallow whole. 09/23/21  Yes Vann, Jessica U, DO  atorvastatin (LIPITOR) 80 MG tablet Take 80 mg by mouth at bedtime.   Yes [provider]  B Complex Vitamins (VITAMIN B COMPLEX) TABS Take 1 tablet by mouth every Monday, Wednesday, and Friday. Dialysis days   Yes [provider]  dextrose (GLUTOSE) 40 % GEL Take 31 g by mouth as needed for low blood sugar. 09/22/21  Yes Eulogio Bear U, DO  epoetin alfa  (EPOGEN) 10000 UNIT/ML injection Inject 10,000 Units into the skin every 3 (three) days.   Yes [provider]  fluticasone (FLONASE) 50 MCG/ACT nasal spray Place 1 spray into both nostrils daily as needed for allergies.   Yes [provider]  folic acid (FOLVITE) 818 MCG tablet Take 800 mcg by mouth every Monday, Wednesday, and Friday. Dialysis days   Yes [provider]  hydrOXYzine (VISTARIL) 25 MG capsule Take 25 mg by mouth every 8 (eight) hours as needed for itching.   Yes [provider]  insulin lispro (HUMALOG) 100 UNIT/ML injection Inject 2-12 Units into the skin daily at 12 noon. Per sliding scale  Less than 70 = call NP 70-200 = 0 units 201-250 = 2 units 251-300 = 4 units 301-350 = 6 units 351-400 = 8 units 401-450 = 10 units 451-600 = 12 units   Yes [provider]  Lactobacillus Probiotic TABS Take 1 capsule by mouth every other day.   Yes [provider]  lactulose (CHRONULAC) 10 GM/15ML solution Take 20 g by mouth daily as needed for mild constipation.   Yes [provider]  lanthanum (FOSRENOL) 500 MG chewable tablet Chew 500 mg by mouth See admin instructions. Chew one tablet (500 mg) by mouth with the largest meal of the day   Yes [provider]  loperamide (IMODIUM A-D) 2 MG tablet Take 2 mg by mouth daily as needed for diarrhea or loose stools.   Yes [provider]  metoCLOPramide (REGLAN) 5 MG tablet Take 5 mg by mouth 3 (three) times daily as needed for nausea.   Yes [provider]  midodrine (PROAMATINE) 5 MG tablet Take 5 mg by mouth 3 (three) times daily with meals. Hold if SBP >130   Yes [provider]  nitroGLYCERIN (NITROSTAT) 0.4 MG SL tablet Place 0.4 mg under the tongue every 5 (five) minutes as needed for chest pain.   Yes [provider]  OXYGEN Inhale 1-3 L into the lungs as needed (to keep sats >90 %).   Yes [provider]  pantoprazole  (PROTONIX) 40 MG tablet Take 1 tablet (40 mg total) by mouth daily. Patient taking differently: Take 40 mg by mouth daily at 6 (six) AM. 05/07/20  Yes Simmons-Robinson, [place], MD  vitamin C (ASCORBIC ACID) 500 MG tablet Take 500 mg by mouth daily.   Yes [provider]  doxercalciferol (HECTOROL) 4 MCG/2ML injection Inject 1.5 mLs (3 mcg total) into the vein every Monday, Wednesday, and Friday with hemodialysis. 09/22/21   Geradine Girt, DO  midodrine (PROAMATINE) 10 MG tablet Take 1 tablet (10 mg total) by mouth 3 (three) times daily with meals. Patient not taking: Reported on 10/05/2021 09/22/21   Geradine Girt, DO  polyethylene glycol (MIRALAX / GLYCOLAX) packet Take 17 g by mouth daily. Patient not taking: Reported on 10/21/2021 09/08/14   Janora Norlander, DO   Current Facility-Administered Medications  Medication Dose Route Frequency Provider Last Rate Last Admin    prismasol BGK 4/2.5 infusion   CRRT Continuous Claudia Desanctis, MD        prismasol BGK 4/2.5 infusion   CRRT Continuous Claudia Desanctis, MD       0.9 %  sodium chloride infusion   Intravenous Continuous Minor, Grace Bushy, NP       0.9 %  sodium chloride infusion   Intra-arterial PRN Minor, Grace Bushy, NP       DOBUTamine (DOBUTREX) infusion 4000 mcg/mL  2.5-20 mcg/kg/min Intravenous Titrated Kommor, Madison, MD 8.47 mL/hr at 10/03/2021 0741 5 mcg/kg/min at 10/16/2021 0741   docusate sodium (COLACE) capsule 100 mg  100 mg Oral BID PRN Minor, Grace Bushy, NP       heparin injection 1,000-6,000 Units  1,000-6,000 Units CRRT PRN Claudia Desanctis, MD       insulin aspart (novoLOG) injection 0-6 Units  0-6 Units Subcutaneous Q4H Minor, Grace Bushy, NP       norepinephrine (LEVOPHED) 4mg  in 263mL (0.016 mg/mL) premix infusion  0-40 mcg/min Intravenous Continuous Kommor, Madison, MD 75 mL/hr at 10/21/2021 0705 20 mcg/min at 10/12/2021 0705   pantoprazole (PROTONIX) injection 40 mg  40 mg Intravenous QHS Minor, Grace Bushy, NP        polyethylene glycol (MIRALAX / GLYCOLAX) packet 17 g  17 g Oral Daily PRN Minor, Grace Bushy, NP       prismasol BGK 4/2.5 infusion   CRRT Continuous Claudia Desanctis, MD       Current Outpatient Medications  Medication Sig Dispense Refill   acetaminophen (TYLENOL) 500 MG tablet Take 1,000 mg by mouth every 6 (six) hours as needed for mild pain.     albuterol (ACCUNEB) 0.63 MG/3ML nebulizer solution Take 1 ampule by nebulization every 6 (six) hours as needed for wheezing.     albuterol (PROVENTIL HFA;VENTOLIN HFA) 108 (90 BASE) MCG/ACT inhaler Inhale 2 puffs into the lungs every 6 (six) hours as needed (COPD).     aspirin EC 81 MG EC tablet Take 1 tablet (81 mg total) by mouth daily. Swallow whole. 30 tablet 11   atorvastatin (LIPITOR) 80 MG tablet Take 80 mg by mouth at bedtime.     B Complex Vitamins (VITAMIN B COMPLEX) TABS Take 1 tablet by mouth every Monday, Wednesday, and Friday. Dialysis days     dextrose (GLUTOSE) 40 % GEL Take 31 g by mouth as needed for low blood sugar.     epoetin alfa (EPOGEN) 10000 UNIT/ML injection Inject 10,000 Units into the skin every 3 (three) days.     fluticasone (FLONASE) 50 MCG/ACT nasal spray Place 1 spray into both nostrils daily as needed for allergies.     folic acid (FOLVITE) 542 MCG tablet Take 800 mcg by  mouth every Monday, Wednesday, and Friday. Dialysis days     hydrOXYzine (VISTARIL) 25 MG capsule Take 25 mg by mouth every 8 (eight) hours as needed for itching.     insulin lispro (HUMALOG) 100 UNIT/ML injection Inject 2-12 Units into the skin daily at 12 noon. Per sliding scale  Less than 70 = call NP 70-200 = 0 units 201-250 = 2 units 251-300 = 4 units 301-350 = 6 units 351-400 = 8 units 401-450 = 10 units 451-600 = 12 units     Lactobacillus Probiotic TABS Take 1 capsule by mouth every other day.     lactulose (CHRONULAC) 10 GM/15ML solution Take 20 g by mouth daily as needed for mild constipation.     lanthanum (FOSRENOL) 500 MG chewable  tablet Chew 500 mg by mouth See admin instructions. Chew one tablet (500 mg) by mouth with the largest meal of the day     loperamide (IMODIUM A-D) 2 MG tablet Take 2 mg by mouth daily as needed for diarrhea or loose stools.     metoCLOPramide (REGLAN) 5 MG tablet Take 5 mg by mouth 3 (three) times daily as needed for nausea.     midodrine (PROAMATINE) 5 MG tablet Take 5 mg by mouth 3 (three) times daily with meals. Hold if SBP >130     nitroGLYCERIN (NITROSTAT) 0.4 MG SL tablet Place 0.4 mg under the tongue every 5 (five) minutes as needed for chest pain.     OXYGEN Inhale 1-3 L into the lungs as needed (to keep sats >90 %).     pantoprazole (PROTONIX) 40 MG tablet Take 1 tablet (40 mg total) by mouth daily. (Patient taking differently: Take 40 mg by mouth daily at 6 (six) AM.) 30 tablet 0   vitamin C (ASCORBIC ACID) 500 MG tablet Take 500 mg by mouth daily.     doxercalciferol (HECTOROL) 4 MCG/2ML injection Inject 1.5 mLs (3 mcg total) into the vein every Monday, Wednesday, and Friday with hemodialysis. 2 mL    midodrine (PROAMATINE) 10 MG tablet Take 1 tablet (10 mg total) by mouth 3 (three) times daily with meals. (Patient not taking: Reported on 09/30/2021)     polyethylene glycol (MIRALAX / GLYCOLAX) packet Take 17 g by mouth daily. (Patient not taking: Reported on 09/30/2021) 14 each 0   Labs: Basic Metabolic Panel: Recent Labs  Lab 09/22/21 0316 10/12/2021 0644 10/10/2021 0651 10/02/2021 0658 10/03/2021 0831 10/21/2021 0843  NA 132* 130* 131* 131* 134*  --   K 4.4 6.6* 6.5* 6.7* 3.8  --   CL 98 96* 101  --   --   --   CO2 22 14*  --   --   --   --   GLUCOSE 122* 134* 128*  --   --   --   BUN 26* 48* 74*  --   --   --   CREATININE 5.06* 8.57* 8.70*  --   --   --   CALCIUM 6.9* 6.9*  --   --   --   --   PHOS  --   --   --   --   --  3.6   Liver Function Tests: Recent Labs  Lab 10/15/2021 0644  AST 299*  ALT 65*  ALKPHOS 231*  BILITOT 3.1*  PROT 7.0  ALBUMIN 2.5*   Recent Labs  Lab  10/08/2021 0644  LIPASE 113*   No results for input(s): AMMONIA in the last 168 hours. CBC: Recent  Labs  Lab 09/22/21 0316 10/06/2021 0644 10/06/2021 0651 10/09/2021 0658 10/14/2021 0831 09/27/2021 0843  WBC 9.0 8.5  --   --   --  7.7  NEUTROABS 7.2 5.5  --   --   --   --   HGB 8.3* 8.1*   < > 11.2* 10.2* 7.8*  HCT 24.9* 25.6*   < > 33.0* 30.0* 23.9*  MCV 79.8* 77.8*  --   --   --  78.4*  PLT 69* 81*  --   --   --  70*   < > = values in this interval not displayed.   Cardiac Enzymes: No results for input(s): CKTOTAL, CKMB, CKMBINDEX, TROPONINI in the last 168 hours. CBG: Recent Labs  Lab 09/22/21 0055 09/22/21 0336 09/22/21 0742 09/22/21 1625 09/22/21 2111  GLUCAP 145* 122* 115* 105* 99   Iron Studies: No results for input(s): IRON, TIBC, TRANSFERRIN, FERRITIN in the last 72 hours. Studies/Results: DG Chest Portable 1 View  Result Date: 10/04/2021 CLINICAL DATA:  Shortness of breath, unresponsive, apnea and hypotension. Status post right thoracentesis on 09/16/2021 EXAM: PORTABLE CHEST 1 VIEW COMPARISON:  09/16/2021 FINDINGS: Stable cardiac enlargement and positioning left jugular tunneled dialysis catheter. Since the post thoracentesis x-ray there has been some reaccumulation of right-sided pleural fluid with a moderate pleural effusion present. No significant left-sided pleural fluid. Probable mild underlying chronic pulmonary interstitial edema. No significant focal airspace consolidation. IMPRESSION: Reaccumulation of right-sided pleural fluid since recent thoracentesis with moderate pleural effusion present. Probable mild underlying chronic pulmonary interstitial edema. Electronically Signed   By: Aletta Edouard M.D.   On: 10/08/2021 07:47    ROS: As per HPI otherwise negative.  Physical Exam: Vitals:   10/09/2021 0846 10/08/2021 0854 10/16/2021 0855 09/27/2021 0900  BP: 111/75   105/72  Pulse:      Resp: 13   14  SpO2:   95% 100%  Weight:  113.4 kg    Height:  5\' 11"  (1.803 m)        General: Critically ill appearing older male in BIPAP.  Head: Normocephalic, atraumatic, sclera non-icteric, mucus membranes are moist Neck: Supple. JVD not elevated. Lungs: Bilateral breath sounds decreased in bases posteriorly, coarse breath sound anteriorly. On BIPAP.  Heart: Paced rhythm. No M/R/G.  Abdomen: NABS. Obese, NT Lower extremities: L BKA with no stump edema but edema L thigh and hip. 1-2+ pitting edema RLE . Edema in hips, lower abdomen, sides of abdomen, chest.  Neuro: Unable to assess D/T BIPAP. Not following commands Dialysis Access: LIJ TDC Drsg intact.   Dialysis Orders: Center: [facility] MWF 4.5 hrs 250NRe 400/800 EDW 112 kg 2.0 K/ 2.0 Ca UFP 2 TDC -Heparin 12000 Heparin 12000 units IV TIW -Hectorol 3 mcg IV TIW -Mircera 30 mcg IV q 2 weeks (no recent doses at OP center)  Assessment/Plan:  Multifactorial Shock: Started on Levophed/Dobutamine. BP remains soft. Seen by PCCM. Being admitted to ICU. PCCM following. Remains full code.  HFrEF/Volume overload: Last EF < 20%. No HD since last HD in hospital 09/22/2021. Volume removal via CRRT as tolerated.  Nonischemic Cardiomyopathy: Mediated by RV pacing. Was to F/U with EP as OP for BiV ICD. Per primary. Hyperkalemia: Has been received NAHCO3/Calcium Gluconate however evidence of hemolysis on AM labs. Repeating labs.  Recurrent R Pleural Effusion: Per primary  ESRD - Will start CRRT when he arrives in ICU. Rechecking RFP. K+ hemolyzed.   Hypertension/volume  - Currently on pressors. See #1, #2  Anemia  -  HGB 8.1 on arrival. No recent ESA. Give ESA today after CRRT initiated.   Metabolic bone disease -  C Ca 8.1. NPO at present. Resume binders, VDRAW when eating.   Nutrition - NPO 11. DMT2-per primary  Jimmye Norman. Owens Shark, NP-C 10/24/2021, 10:41 AM  [employer] [phone]   Seen and examined independently.  Agree with note and exam as documented above by physician extender and as noted here.  Mr.  Chamberlain Bailey is a 66 year old male with a history of ESRD on HD MWF at [facility], nonischemic cardiomyopathy, HTN, type 2 DM, and high grade AV block s/p pacemaker who presented to the hospital with shortness of breath. He has missed several HD treatments and note was recently admitted.  He was also found to be confused/lethargic earlier and had a brief interval of being unresponsive and apneic per charting.  He was hypotensive and was initiated on levophed which was running at 20 mcg/min on my arrival; he was also initiated on dobutamine.  He has been on BIPAP.  Initial labs demonstrated hyperkalemia though these did have hemolysis.  Repeat labs quite different with K 3.8.  repeat BMP was requested to verify but not yet available.  His son is at the bedside.  Nephrology is consulted for dialysis needs which will be CRRT in this gentleman with acute resp failure on pressors   General adult male in bed on BIPAP  HEENT normocephalic atraumatic extraocular movements intact sclera anicteric Neck supple trachea midline Lungs obscured by BIPAP; increased work of breathing with exertion Heart S1S2 no rub Abdomen soft nontender pitting edema of abdominal wall  Extremities pitting edema residual limb/lower extremity Neuro - anxious but more awake then per previous charting.  Access: left IJ tunn catheter   Shock - multifactorial including cardiogenic  - on pressors per pulm   Heart failure reduced EF - on dobutamine; optimize volume with CRRT   Hyperkalemia - await repeat BMP and for CRRT with close monitoring  ESRD - for CRRT; repeat BMP to guide K content. Goal net negative 50 -100 ml/hr as tolerated  Acute hypoxic resp failure - optimize volume with CRRT.  Per pulm. He is on BIPAP currently   Anemia CKD - question accuracy of istat Hb. Will need ESA. Transfusion per primary team   Metabolic bone disease - no binders while on CRRT  Claudia Desanctis, MD 10/07/2021 2:00 PM

## 2021-09-28 NOTE — ED Notes (Signed)
Denies nausea at this time. Labored breathing with accessory muscle use noted on NRB mask. Switched to Bipap by R.T.

## 2021-09-28 NOTE — H&P (Signed)
NAME:  Jonathon Bailey, MRN:  [id number], DOB:  [birth year], LOS: 0 ADMISSION DATE:  10/11/2021, CONSULTATION DATE: 10/06/2021 REFERRING MD: Emergency department physician, CHIEF COMPLAINT: Shock  History of Present Illness:  Jonathon Bailey is a 66 year old male with a plethora of health issues with well-documented below and who presents with shock, hyperkalemia nausea vomiting diarrhea and has missed hemodialysis for period of 6 days.  His potassium is greater than 6 is been treated with bicarbonate, calcium insulin and D50.  He is currently on dobutamine per cardiology and Levophed per emergency department physician.  Intubation has been avoided due to the fact that due to his tenuous status will most likely code during intubation.  Therefore our goal is to get him to the intensive care unit soon as possible and initiate CRRT to correct his electrolyte disturbances.  We will check a procalcitonin to rule out infection with no antimicrobial therapy has been started at this time.  Pertinent  Medical History   Past Medical History:  Diagnosis Date   Anxiety    Arthritis    Asthma    "as a child"   CAD (coronary artery disease)    Chronic systolic heart failure ([place]) 09/21/2008   ECHO Feb 2013 showed LVEF low normal at 50-55%, +hypokinetic anterolateral wall and inferolateral wall.     CKD (chronic kidney disease) stage 4, GFR 15-29 ml/min (HCC) 08/30/2009   Progressive renal failure since 2008, creatinine 1.2 in 2008 up to 3.5 in 2012 and 3.2-5.0 in 2013. All UA's 2011-13 showed >300 protein on dipstick. Work-up in May 2011 showed negative Urine IFE and SPEP, ultrasound showed 12-13 cm kidneys with increased echogenicity and UPC ratio was 1.5 gm proteinuria.  Hgb A1C's from 2011 to 2013 were all between 9-11.  Patient saw Jonathon Bailey (vasc surgery) for HD access in Aug 2013 > vein mapping was done and Jonathon Bailey felt the left arm (pt is R handed) was suitable for L arm Cimino radiocephalic fistula. Patient  said he wasn't ready to consider doing dialysis and declined the surgery.      Depression    Diabetic retinopathy ([place])    ESRD (end stage renal disease) on dialysis ([place])    Headache(784.0)    "maybe monthly" (03/15/2014)   Hyperlipidemia    Hypertension    Macular edema    Myocardial infarction [facility])    status post MI x2 and 3 stents placed in 2003   Obesity    Sleep apnea    does not use CPAP   Stroke ([place]) ~ 2007; ~1987   "weak on right side; messed w/right side of brain; cry all the time"   Type II diabetes mellitus ([place])    insulin dependent     Significant Hospital Events: Including procedures, antibiotic start and stop dates in addition to other pertinent events   10/20/2021 right femoral CVL placed 10/18/2021 right radial A-line Left internal jugular Vas-Cath is in place  Interim History / Subjective:  66 year old male with a plethora of health issues known EF less than 20% presents not having had dialysis in the previous 6 days.  He has hypotensive and hyperkalemic.  Objective   Blood pressure 120/69, pulse 90, resp. rate 17.       No intake or output data in the 24 hours ending 10/08/2021 0814 There were no vitals filed for this visit.  Examination: General: Elderly male who appears much older than stated age is awake and follows some commands HENT: No JVD  or lymphadenopathy is appreciated Lungs: Decreased breath sounds bilateral) Cardiovascular: Currently in a paced rhythm heart sounds are distant Abdomen: Obese tender faint bowel sounds Extremities: Left BKA is noted in right lower extremity venous stasis poor blood flow multiple areas of the skin are noted Neuro: Does not follow commands GU: End-stage renal disease  Resolved Hospital Problem list     Assessment & Plan:  Shock multifactorial in the setting of known EF of less than 20%, pacemaker in place, has not had dialysis in 6 days due to nausea vomiting and feeling poorly.  Currently on dobutamine and  Levophed with a systolic pressure of 676 Admit to the intensive care unit Levophed and dobutamine to keep systolic blood pressure greater than 90 mean  Questional permanent pacemaker arterial pressure greater than 60 Place arterial line if possible Patient is possibly needs hemodialysis or CRRT  Electrolyte disturbance secondary to having missed dialysis for a period of 6 days. Recent Labs  Lab 10/23/2021 0644 10/12/2021 0651 10/03/2021 0658  K 6.6* 6.5* 6.7*  Symptomatic treatment of hyperkalemia Needs hemodialysis Nephrology consult Monitor electrolytes Correct electrolyte disturbances along with acidosis utilizing bicarb  Vasculopath status post left BKA currently with left IJ Vas-Cath in place for dialysis Left femoral CVL L placed Right radial arterial line complaint  Chronic failure to thrive currently is a full code He needs palliative care consult  Diabetes mellitus  CBG (last 3)  No results for input(s): GLUCAP in the last 72 hours.     Best Practice (right click and "Reselect all SmartList Selections" daily)   Diet/type: NPO DVT prophylaxis:  GI prophylaxis: PPI Lines: Central line Foley:  Yes, and it is still needed Code Status:  full code Last date of multidisciplinary goals of care discussion [tbd]  Labs   CBC: Recent Labs  Lab 09/22/21 0316 09/26/2021 0651 10/24/2021 0658  WBC 9.0  --   --   NEUTROABS 7.2  --   --   HGB 8.3* 11.2* 11.2*  HCT 24.9* 33.0* 33.0*  MCV 79.8*  --   --   PLT 69*  --   --     Basic Metabolic Panel: Recent Labs  Lab 09/22/21 0316 10/11/2021 0651 09/27/2021 0658  NA 132* 131* 131*  K 4.4 6.5* 6.7*  CL 98 101  --   CO2 22  --   --   GLUCOSE 122* 128*  --   BUN 26* 74*  --   CREATININE 5.06* 8.70*  --   CALCIUM 6.9*  --   --    GFR: Estimated Creatinine Clearance: 11 mL/min (A) (by C-G formula based on SCr of 8.7 mg/dL (H)). Recent Labs  Lab 09/22/21 0316  WBC 9.0  LATICACIDVEN 4.9*    Liver Function Tests: No  results for input(s): AST, ALT, ALKPHOS, BILITOT, PROT, ALBUMIN in the last 168 hours. No results for input(s): LIPASE, AMYLASE in the last 168 hours. No results for input(s): AMMONIA in the last 168 hours.  ABG    Component Value Date/Time   HCO3 16.9 (L) 10/22/2021 0658   TCO2 18 (L) 10/10/2021 0658   ACIDBASEDEF 9.0 (H) 10/04/2021 0658   O2SAT 98.0 10/22/2021 0658     Coagulation Profile: No results for input(s): INR, PROTIME in the last 168 hours.  Cardiac Enzymes: No results for input(s): CKTOTAL, CKMB, CKMBINDEX, TROPONINI in the last 168 hours.  HbA1C: Hgb A1c MFr Bld  Date/Time Value Ref Range Status  09/17/2021 04:42 AM 5.5 4.8 - 5.6 %  Final    Comment:    (NOTE) Pre diabetes:          5.7%-6.4%  Diabetes:              >6.4%  Glycemic control for   <7.0% adults with diabetes   04/29/2020 04:28 AM 7.9 (H) 4.8 - 5.6 % Final    Comment:    (NOTE) Pre diabetes:          5.7%-6.4%  Diabetes:              >6.4%  Glycemic control for   <7.0% adults with diabetes     CBG: Recent Labs  Lab 09/22/21 0055 09/22/21 0336 09/22/21 0742 09/22/21 1625 09/22/21 2111  GLUCAP 145* 122* 115* 105* 99    Review of Systems:   na  Past Medical History:  He,  has a past medical history of Anxiety, Arthritis, Asthma, CAD (coronary artery disease), Chronic systolic heart failure ([place]) (09/21/2008), CKD (chronic kidney disease) stage 4, GFR 15-29 ml/min (HCC) (08/30/2009), Depression, Diabetic retinopathy ([place]), ESRD (end stage renal disease) on dialysis ([place]), Headache(784.0), Hyperlipidemia, Hypertension, Macular edema, Myocardial infarction [facility]), Obesity, Sleep apnea, Stroke ([place]) (~ 2007; ~1987), and Type II diabetes mellitus ([place]).   Surgical History:   Past Surgical History:  Procedure Laterality Date   ABDOMINAL AORTOGRAM W/LOWER EXTREMITY N/A 04/29/2020   Procedure: ABDOMINAL AORTOGRAM W/LOWER EXTREMITY;  Surgeon: Angelia Mould, MD;  Location: [place]  CV [facility];  Service: Cardiovascular;  Laterality: N/A;   AMPUTATION Left 05/02/2020   Procedure: AMPUTATION BELOW KNEE;  Surgeon: Rosetta Posner, MD;  Location: [place];  Service: Vascular;  Laterality: Left;   AV FISTULA PLACEMENT  10/17/2012   Procedure: ARTERIOVENOUS (AV) FISTULA CREATION;  Surgeon: Mal Misty, MD;  Location: [place];  Service: Vascular;  Laterality: Left;   BONE EXOSTOSIS EXCISION Right 06/04/2019   Procedure: EXOSTOSIS EXCISION;  Surgeon: Leanora Cover, MD;  Location: [place];  Service: Orthopedics;  Laterality: Right;   CAPD INSERTION N/A 03/15/2014   Procedure: LAPAROSCOPIC INSERTION CONTINUOUS AMBULATORY PERITONEAL DIALYSIS CATHETER, LAPARASCOPIC INCISIONAL HERNIA  REPAIR  WITH MESH, OMENTOPEXY AND LYSIS OF ADHESIONS;  Surgeon: [name], MD;  Location: [place];  Service: General;  Laterality: N/A;   CORONARY ANGIOPLASTY WITH STENT PLACEMENT  ~ 2002   "3"   CORONARY ANGIOPLASTY WITH STENT PLACEMENT  01/24/2004   successful PCI/stenting RCA  drug eluting cypher stent   CORONARY ANGIOPLASTY WITH STENT PLACEMENT  01/28/2004   successful stentin of a large bifurcation marginal branch of the ramus intermediate vessel   FLEXIBLE SIGMOIDOSCOPY N/A 09/05/2014   Procedure: FLEXIBLE SIGMOIDOSCOPY;  Surgeon: Cleotis Nipper, MD;  Location: [place];  Service: Endoscopy;  Laterality: N/A;   FOREIGN BODY RETRIEVAL N/A 09/19/2021   Procedure: FOREIGN BODY RETRIEVAL;  Surgeon: Adrian Prows, MD;  Location: [place] [facility];  Service: Cardiovascular;  Laterality: N/A;   HERNIA REPAIR     INCISION AND DRAINAGE ABSCESS N/A 09/06/2014   Procedure: REMOVAL OF PD CATH;  Surgeon: Coralie Keens, MD;  Location: [place];  Service: General;  Laterality: N/A;   INCISIONAL HERNIA REPAIR  03/15/2014   LAPAROSCOPIC LYSIS OF ADHESIONS  03/15/2014   NM MYOCAR PERF WALL MOTION  02/06/2012   normal perfusion scan   PERITONEAL CATHETER INSERTION  03/15/2014   REFRACTIVE SURGERY  Bilateral    RIGHT/LEFT HEART CATH AND CORONARY/GRAFT ANGIOGRAPHY N/A 09/19/2021   Procedure: RIGHT/LEFT HEART CATH AND CORONARY/GRAFT ANGIOGRAPHY;  Surgeon: Adrian Prows, MD;  Location: [place] [facility];  Service: Cardiovascular;  Laterality: N/A;   SHUNTOGRAM N/A 03/11/2013   Procedure: Fistulogram;  Surgeon: Conrad Lynchburg, MD;  Location: [facility] CATH LAB;  Service: Cardiovascular;  Laterality: N/A;   TENDON TRANSFER Right 06/04/2019   Procedure: RIGHT HAND EXTENSOR TENDON TRANSFER TO SMALL FINGER;  Surgeon: Leanora Cover, MD;  Location: [place];  Service: Orthopedics;  Laterality: Right;   UMBILICAL HERNIA REPAIR       Social History:   reports that he quit smoking about 35 years ago. His smoking use included cigarettes. He has a 15.00 pack-year smoking history. He has never used smokeless tobacco. He reports that he does not currently use alcohol. He reports that he does not use drugs.   Family History:  His family history includes Asthma in his mother; Cancer in his father; Deep vein thrombosis in his father; Diabetes in his father; Heart attack in his father; Heart disease in his father; Hyperlipidemia in his father and mother; Hypertension in his father and mother; Other in his father and sister; Prostate cancer in his father; Stroke in his father.   Allergies Allergies  Allergen Reactions   Ferumoxytol Itching        Iron Itching         Iodinated Contrast Media Itching   Sildenafil Other (See Comments)    Other reaction(s): Abnormal sexual function     Home Medications  Prior to Admission medications   Medication Sig Start Date End Date Taking? Authorizing Provider  acetaminophen (TYLENOL) 500 MG tablet Take 1,000 mg by mouth every 6 (six) hours as needed for headache (pain).   Yes [provider]  albuterol (ACCUNEB) 0.63 MG/3ML nebulizer solution Take 1 ampule by nebulization every 6 (six) hours as needed for wheezing.   Yes [provider]   albuterol (PROVENTIL HFA;VENTOLIN HFA) 108 (90 BASE) MCG/ACT inhaler Inhale 2 puffs into the lungs every 6 (six) hours as needed (COPD).   Yes [provider]  aspirin EC 81 MG EC tablet Take 1 tablet (81 mg total) by mouth daily. Swallow whole. 09/23/21  Yes Vann, Jessica U, DO  atorvastatin (LIPITOR) 80 MG tablet Take 80 mg by mouth at bedtime.   Yes [provider]  B Complex Vitamins (VITAMIN B COMPLEX) TABS Take 1 tablet by mouth every Monday, Wednesday, and Friday. Dialysis days   Yes [provider]  dextrose (GLUTOSE) 40 % GEL Take 31 g by mouth as needed for low blood sugar. 09/22/21  Yes Eulogio Bear U, DO  epoetin alfa (EPOGEN) 10000 UNIT/ML injection Inject 10,000 Units into the skin every 3 (three) days.   Yes [provider]  fluticasone (FLONASE) 50 MCG/ACT nasal spray Place 1 spray into both nostrils daily as needed for allergies.   Yes [provider]  folic acid (FOLVITE) 712 MCG tablet Take 400 mcg by mouth every Monday, Wednesday, and Friday. Dialysis days   Yes [provider]  hydrOXYzine (VISTARIL) 25 MG capsule Take 25 mg by mouth every 8 (eight) hours.   Yes [provider]  insulin lispro (HUMALOG) 100 UNIT/ML injection Inject 2-12 Units into the skin See admin instructions. Per sliding scale qd Less than 70 = call NP 70-200 = 0 units 201-250 = 2 units 251-300 = 4 units 301-350 = 6 units 351-400 = 8 units 401-450 = 10 units 451-600 = 12 units   Yes [provider]  Lactobacillus Probiotic TABS  Take 1 capsule by mouth every other day.   Yes [provider]  lactulose (CHRONULAC) 10 GM/15ML solution Take 20 g by mouth daily as needed (constipation).   Yes [provider]  lanthanum (FOSRENOL) 500 MG chewable tablet Chew 500 mg by mouth See admin instructions. Chew one tablet (500 mg) by mouth with the largest meal of the day   Yes [provider]  loperamide (IMODIUM A-D)  2 MG tablet Take 2 mg by mouth daily as needed for diarrhea or loose stools.   Yes [provider]  loperamide (IMODIUM) 2 MG capsule Take 2 mg by mouth at bedtime.   Yes [provider]  metoCLOPramide (REGLAN) 5 MG tablet Take 5 mg by mouth 3 (three) times daily as needed for nausea.   Yes [provider]  midodrine (PROAMATINE) 10 MG tablet Take 1 tablet (10 mg total) by mouth 3 (three) times daily with meals. Patient taking differently: Take 10 mg by mouth 3 (three) times daily with meals. Hold for SBP >130 09/22/21  Yes Vann, Jessica U, DO  nitroGLYCERIN (NITROSTAT) 0.4 MG SL tablet Place 0.4 mg under the tongue every 5 (five) minutes as needed for chest pain.   Yes [provider]  OXYGEN Inhale 1-3 L into the lungs 2 (two) times daily as needed (to keep sats >90 %).   Yes [provider]  pantoprazole (PROTONIX) 40 MG tablet Take 1 tablet (40 mg total) by mouth daily. Patient taking differently: Take 40 mg by mouth daily at 6 (six) AM. 05/07/20  Yes Simmons-Robinson, Mak[place], MD  vitamin C (ASCORBIC ACID) 500 MG tablet Take 500 mg by mouth daily.   Yes [provider]  doxercalciferol (HECTOROL) 4 MCG/2ML injection Inject 1.5 mLs (3 mcg total) into the vein every Monday, Wednesday, and Friday with hemodialysis. 09/22/21   Geradine Girt, DO  polyethylene glycol (MIRALAX / GLYCOLAX) packet Take 17 g by mouth daily. Patient taking differently: Take 17 g by mouth daily as needed (constipation). Mix with 6 oz fluid 09/08/14   Ronnie Doss M, DO  tiotropium (SPIRIVA) 18 MCG inhalation capsule Place 18 mcg into inhaler and inhale daily as needed (COPD).    [provider]     Critical care time: 107 min    [name] Kilynn Fitzsimmons ACNP Acute Care Nurse Practitioner [place] Please consult Amion 10/05/2021, 8:14 AM

## 2021-09-28 NOTE — ED Notes (Signed)
Tolerating BiPap well. Resting with eyes closed. O2 sats 100%. BP 105/72. Levo gtt continues at 39mcg/min. Dobutamine gtt continues at 5mg /kg/min through central line.

## 2021-09-28 NOTE — ED Notes (Signed)
Son and daughter at bedside. Patient remains A&O x 4. Tolerating Bipap. Repositioned for comfort.

## 2021-09-28 NOTE — Progress Notes (Signed)
Pulse-ox with good pleth but reading in the 70's despite repostioning/changing of probe. RT obtained abg at this time. PaO2  351 with a SaO2 of 100%. FiO2 on Bipap weaned at this time from 100% to 50%. iCA 0.81, RN notified. RT will continue to monitor and be available as needed.

## 2021-09-28 NOTE — ED Notes (Signed)
Pt states he missed dialysis for 6 days

## 2021-09-28 NOTE — ED Provider Notes (Signed)
[facility] EMERGENCY DEPARTMENT Provider Note  CSN: [id number] Arrival date & time: 10/14/2021 6237  Chief Complaint(s) Shortness of Breath  HPI Jonathon Bailey is a 66 y.o. 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  Patient states that he is only missed 1 dialysis session due to diarrhea.  He initially called EMS for shortness of breath and while EMS was evaluating patient, patient went somnolent and was found to have pressures as low as systolics of 62G.  Patient arrives on a nonrebreather and is intermittently awake and responsive to commands but is somnolent.  Unable to provide additional history as the patient is in respiratory distress.   Shortness of Breath  Past Medical History Past Medical History:  Diagnosis Date   Anxiety    Arthritis    Asthma    "as a child"   CAD (coronary artery disease)    Chronic systolic heart failure ([place]) 09/21/2008   ECHO Feb 2013 showed LVEF low normal at 50-55%, +hypokinetic anterolateral wall and inferolateral wall.     CKD (chronic kidney disease) stage 4, GFR 15-29 ml/min (HCC) 08/30/2009   Progressive renal failure since 2008, creatinine 1.2 in 2008 up to 3.5 in 2012 and 3.2-5.0 in 2013. All UA's 2011-13 showed >300 protein on dipstick. Work-up in May 2011 showed negative Urine IFE and SPEP, ultrasound showed 12-13 cm kidneys with increased echogenicity and UPC ratio was 1.5 gm proteinuria.  Hgb A1C's from 2011 to 2013 were all between 9-11.  Patient saw Dr. Donnetta Hutching (vasc surgery) for HD access in Aug 2013 > vein mapping was done and Dr. Donnetta Hutching felt the left arm (pt is R handed) was suitable for L arm Cimino radiocephalic fistula. Patient said he wasn't ready to consider doing dialysis and  declined the surgery.      Depression    Diabetic retinopathy ([place])    ESRD (end stage renal disease) on dialysis ([place])    Headache(784.0)    "maybe monthly" (03/15/2014)   Hyperlipidemia    Hypertension    Macular edema    Myocardial infarction [facility])    status post MI x2 and 3 stents placed in 2003   Obesity    Sleep apnea    does not use CPAP   Stroke ([place]) ~ 2007; ~1987   "weak on right side; messed w/right side of brain; cry all the time"   Type II diabetes mellitus (HCC)    insulin dependent   Patient Active Problem List   Diagnosis Date Noted   Foreign body accidentally left during a procedure    Acute on chronic combined systolic and diastolic CHF (congestive heart failure) ([place]) 09/16/2021   Volume overload 09/15/2021   Acute on chronic systolic CHF (congestive heart failure) ([place]) 09/15/2021   Hypotension 09/15/2021   Lactic acidosis 09/15/2021   Thrombocytopenia ([place]) 09/15/2021   Moderate protein-calorie malnutrition ([place]) 05/27/2020   Allergy, unspecified, initial encounter 05/18/2020   Anaphylactic shock, unspecified, initial encounter 05/18/2020   ESRD on dialysis [facility])    Constipation    Noncompliance    Abdominal distension    Drug induced constipation    Leukocytosis    Essential hypertension    Labile blood pressure    Controlled type 2 diabetes mellitus with hyperglycemia, with long-term current  use of insulin (HCC)    Unilateral complete BKA, left, initial encounter ([place]) 05/06/2020   Postoperative pain    Anemia in chronic kidney disease, on chronic dialysis ([place])    Anemia of chronic disease    S/P BKA (below knee amputation) unilateral, left ([place])    Sepsis without septic shock ([place]) 04/28/2020   Wet gangrene ([place]) 04/28/2020   Fluid overload, unspecified 08/03/2019   Hyperkalemia 10/21/2018   Encounter for removal of sutures 09/22/2018   Morbid obesity with BMI of 40.0-44.9, adult ([place]) 03/05/2018   History of completed stroke 02/14/2018    Unstable angina ([place]) 02/14/2018   Dependence on renal dialysis ([place]) 11/12/2017   Chills (without fever) 01/01/2017   Local infection due to central venous catheter, initial encounter 09/08/2016   Diabetic retinopathy ([place]) 08/21/2016   Hypercalcemia 10/25/2014   Anxiety disorder, unspecified 09/10/2014   Coagulation defect, unspecified ([place]) 09/10/2014   Iron deficiency anemia, unspecified 09/10/2014   Major depressive disorder, single episode, unspecified 09/10/2014   Pain, unspecified 09/10/2014   Peritonitis, unspecified ([place]) 09/10/2014   Pruritus, unspecified 09/10/2014   Secondary hyperparathyroidism of renal origin ([place]) 09/10/2014   Unspecified osteoarthritis, unspecified site 09/10/2014   Other specified fever    Generalized abdominal pain    Acute lower GI bleeding    ESRD (end stage renal disease) ([place])    Bacterial peritonitis ([place])    Blood poisoning    ESRD on peritoneal dialysis ([place])    Acute blood loss anemia    Abdominal pain 09/01/2014   CAPD (continuous ambulatory peritoneal dialysis) status 03/15/2014   CAD S/P percutaneous coronary angioplasty 02/03/2014   H/O CHF 02/03/2014   History of cocaine use 02/03/2014   S/P coronary artery stent placement 02/03/2014   Morbid obesity ([place]) 02/01/2014   Recurrent incisional hernia x5 with incarceration s/p lap repair w mesh 03/15/2014 02/01/2014   Preventative health care 81/19/1478   Other complications due to renal dialysis device, implant, and graft 10/24/2012   COPD (chronic obstructive pulmonary disease) ([place]) 10/23/2012   DIABETIC PERIPHERAL NEUROPATHY 01/09/2010   Type 2 diabetes mellitus with diabetic neuropathy, unspecified ([place]) 01/09/2010   ESRD (end stage renal disease) on dialysis ([place]) 08/30/2009   CEREBROVASCULAR ACCIDENT, HX OF 08/30/2009   Cerebral infarction ([place]) 08/30/2009   Diabetes mellitus type 2 with complications ([place]) 29/56/2130   HYPERLIPIDEMIA 09/21/2008   OBSTRUCTIVE SLEEP APNEA  09/21/2008   ESSENTIAL HYPERTENSION 09/21/2008   Coronary atherosclerosis 09/21/2008   Chronic combined systolic and diastolic heart failure ([place]) 09/21/2008   Home Medication(s) Prior to Admission medications   Medication Sig Start Date End Date Taking? Authorizing Provider  acetaminophen (TYLENOL) 500 MG tablet Take 1,000 mg by mouth every 6 (six) hours as needed for headache (pain).    [provider]  albuterol (ACCUNEB) 0.63 MG/3ML nebulizer solution Take 1 ampule by nebulization every 6 (six) hours as needed for wheezing.    [provider]  albuterol (PROVENTIL HFA;VENTOLIN HFA) 108 (90 BASE) MCG/ACT inhaler Inhale 2 puffs into the lungs every 6 (six) hours as needed (COPD).    [provider]  aspirin EC 81 MG EC tablet Take 1 tablet (81 mg total) by mouth daily. Swallow whole. 09/23/21   Geradine Girt, DO  atorvastatin (LIPITOR) 80 MG tablet Take 80 mg by mouth daily after supper.    [provider]  B Complex Vitamins (VITAMIN B COMPLEX) TABS Take 1 tablet by mouth every Monday, Wednesday, and Friday. Dialysis days  [provider]  dextrose (GLUTOSE) 40 % GEL Take 31 g by mouth as needed for low blood sugar. 09/22/21   Geradine Girt, DO  doxercalciferol (HECTOROL) 4 MCG/2ML injection Inject 1.5 mLs (3 mcg total) into the vein every Monday, Wednesday, and Friday with hemodialysis. 09/22/21   Geradine Girt, DO  epoetin alfa (EPOGEN) 10000 UNIT/ML injection Inject 10,000 Units into the skin every 3 (three) days.    [provider]  fluticasone (FLONASE) 50 MCG/ACT nasal spray Place 1 spray into both nostrils daily.    [provider]  folic acid (FOLVITE) 355 MCG tablet Take 400 mcg by mouth every Monday, Wednesday, and Friday. Dialysis days    [provider]  hydrOXYzine (ATARAX) 25 MG tablet Take 25 mg by mouth every 6 (six) hours as needed for itching (allergies).    [provider]  insulin  lispro (HUMALOG) 100 UNIT/ML injection Inject 2-12 Units into the skin See admin instructions. Inject 2-12 units subcutaneously three times daily before meals per sliding scale - CBG 201-250 2 units, 251-300 4 units, 301-350 6 units, 351-400 8 units, 401-450 10 units, >450 12 units and call MD    [provider]  lactulose (CHRONULAC) 10 GM/15ML solution Take 20 g by mouth daily as needed (constipation).    [provider]  lanthanum (FOSRENOL) 500 MG chewable tablet Chew 500 mg by mouth See admin instructions. Chew one tablet (500 mg) by mouth with the largest meal of the day    [provider]  loperamide (IMODIUM) 2 MG capsule Take 2 mg by mouth daily as needed for diarrhea or loose stools.    [provider]  metoCLOPramide (REGLAN) 5 MG tablet Take 5 mg by mouth 3 (three) times daily as needed for nausea.    [provider]  midodrine (PROAMATINE) 10 MG tablet Take 1 tablet (10 mg total) by mouth 3 (three) times daily with meals. 09/22/21   Geradine Girt, DO  nitroGLYCERIN (NITROSTAT) 0.4 MG SL tablet Place 0.4 mg under the tongue every 5 (five) minutes as needed for chest pain.    [provider]  pantoprazole (PROTONIX) 40 MG tablet Take 1 tablet (40 mg total) by mouth daily. Patient taking differently: Take 40 mg by mouth daily at 6 (six) AM. 05/07/20   Simmons-Robinson, Riki Sheer, MD  polyethylene glycol (MIRALAX / GLYCOLAX) packet Take 17 g by mouth daily. Patient taking differently: Take 17 g by mouth daily as needed (constipation). Mix with 6 oz fluid 09/08/14   Ronnie Doss M, DO  tiotropium (SPIRIVA) 18 MCG inhalation capsule Place 18 mcg into inhaler and inhale daily as needed (COPD).    [provider]  vitamin C (ASCORBIC ACID) 500 MG tablet Take 500 mg by mouth daily.    [provider]  Past Surgical History Past Surgical History:  Procedure Laterality Date   ABDOMINAL AORTOGRAM W/LOWER EXTREMITY N/A 04/29/2020   Procedure: ABDOMINAL AORTOGRAM W/LOWER EXTREMITY;  Surgeon: Angelia Mould, MD;  Location: [place] CV LAB;  Service: Cardiovascular;  Laterality: N/A;   AMPUTATION Left 05/02/2020   Procedure: AMPUTATION BELOW KNEE;  Surgeon: Rosetta Posner, MD;  Location: [place];  Service: Vascular;  Laterality: Left;   AV FISTULA PLACEMENT  10/17/2012   Procedure: ARTERIOVENOUS (AV) FISTULA CREATION;  Surgeon: Mal Misty, MD;  Location: [place];  Service: Vascular;  Laterality: Left;   BONE EXOSTOSIS EXCISION Right 06/04/2019   Procedure: EXOSTOSIS EXCISION;  Surgeon: Leanora Cover, MD;  Location: [place];  Service: Orthopedics;  Laterality: Right;   CAPD INSERTION N/A 03/15/2014   Procedure: LAPAROSCOPIC INSERTION CONTINUOUS AMBULATORY PERITONEAL DIALYSIS CATHETER, LAPARASCOPIC INCISIONAL HERNIA  REPAIR  WITH MESH, OMENTOPEXY AND LYSIS OF ADHESIONS;  Surgeon: [name], MD;  Location: [place];  Service: General;  Laterality: N/A;   CORONARY ANGIOPLASTY WITH STENT PLACEMENT  ~ 2002   "3"   CORONARY ANGIOPLASTY WITH STENT PLACEMENT  01/24/2004   successful PCI/stenting RCA  drug eluting cypher stent   CORONARY ANGIOPLASTY WITH STENT PLACEMENT  01/28/2004   successful stentin of a large bifurcation marginal branch of the ramus intermediate vessel   FLEXIBLE SIGMOIDOSCOPY N/A 09/05/2014   Procedure: FLEXIBLE SIGMOIDOSCOPY;  Surgeon: Cleotis Nipper, MD;  Location: [place];  Service: Endoscopy;  Laterality: N/A;   FOREIGN BODY RETRIEVAL N/A 09/19/2021   Procedure: FOREIGN BODY RETRIEVAL;  Surgeon: Adrian Prows, MD;  Location: [place] CV [facility];  Service: Cardiovascular;  Laterality: N/A;   HERNIA REPAIR     INCISION AND DRAINAGE ABSCESS N/A 09/06/2014   Procedure: REMOVAL OF PD CATH;  Surgeon: Coralie Keens, MD;  Location: [place];  Service:  General;  Laterality: N/A;   [place]  03/15/2014   LAPAROSCOPIC LYSIS OF ADHESIONS  03/15/2014   NM MYOCAR PERF WALL MOTION  02/06/2012   normal perfusion scan   PERITONEAL CATHETER INSERTION  03/15/2014   REFRACTIVE SURGERY Bilateral    RIGHT/LEFT HEART CATH AND CORONARY/GRAFT ANGIOGRAPHY N/A 09/19/2021   Procedure: RIGHT/LEFT HEART CATH AND CORONARY/GRAFT ANGIOGRAPHY;  Surgeon: Adrian Prows, MD;  Location: [place] CV LAB;  Service: Cardiovascular;  Laterality: N/A;   SHUNTOGRAM N/A 03/11/2013   Procedure: Fistulogram;  Surgeon: Conrad Titus, MD;  Location: [facility] C[facility] LAB;  Service: Cardiovascular;  Laterality: N/A;   TENDON TRANSFER Right 06/04/2019   Procedure: RIGHT HAND EXTENSOR TENDON TRANSFER TO SMALL FINGER;  Surgeon: Leanora Cover, MD;  Location: [place];  Service: Orthopedics;  Laterality: Right;   UMBILICAL HERNIA REPAIR     Family History Family History  Problem Relation Age of Onset   Asthma Mother    Hyperlipidemia Mother    Hypertension Mother    Stroke Father    Heart attack Father    Prostate cancer Father    Deep vein thrombosis Father    Cancer Father    Diabetes Father    Hyperlipidemia Father    Hypertension Father    Other Father        varicose veins   Heart disease Father        before age 28   Other Sister        varicose veins    Social History Social History   Tobacco Use   Smoking status: Former  Packs/day: 1.00    Years: 15.00    Pack years: 15.00    Types: Cigarettes    Quit date: 09/24/1986    Years since quitting: 35.0   Smokeless tobacco: Never  Vaping Use   Vaping Use: Never used  Substance Use Topics   Alcohol use: Not Currently    Comment: stopped in 2013   Drug use: No   Allergies Ferumoxytol, Iron, Iodinated contrast media, and Sildenafil  Review of Systems Review of Systems  Respiratory:  Positive for shortness of breath.   Gastrointestinal:  Positive for diarrhea.   Physical Exam Vital  Signs  I have reviewed the triage vital signs There were no vitals taken for this visit.  Physical Exam Vitals and nursing note reviewed.  Constitutional:      General: He is not in acute distress.    Appearance: He is well-developed. He is ill-appearing.  HENT:     Head: Normocephalic and atraumatic.  Eyes:     Conjunctiva/sclera: Conjunctivae normal.  Cardiovascular:     Rate and Rhythm: Normal rate and regular rhythm.     Heart sounds: No murmur heard. Pulmonary:     Effort: Tachypnea and respiratory distress present.     Breath sounds: Rales present.  Abdominal:     Palpations: Abdomen is soft.     Tenderness: There is no abdominal tenderness.  Musculoskeletal:        General: No swelling.     Cervical back: Neck supple.  Skin:    General: Skin is warm and dry.     Capillary Refill: Capillary refill takes less than 2 seconds.  Neurological:     Mental Status: He is alert.  Psychiatric:        Mood and Affect: Mood normal.    ED Results and Treatments Labs (all labs ordered are listed, but only abnormal results are displayed) Labs Reviewed  COMPREHENSIVE METABOLIC PANEL  CBC WITH DIFFERENTIAL/PLATELET  LIPASE, BLOOD  I-STAT VENOUS BLOOD GAS, ED  I-STAT CHEM 8, ED                                                                                                                         EKG  EKG Interpretation  Date/Time:  Thursday September 28 2021 06:38:00 EST Ventricular Rate:  95 PR Interval:  135 QRS Duration: 188 QT Interval:  472 QTC Calculation: 594 R Axis:   -70 Text Interpretation: Ventricular-paced rhythm No further analysis attempted due to paced rhythm Confirmed by Bridget Westbrooks ([id number]) on 09/30/2021 7:34:24 AM       Radiology No results found.  Pertinent labs & imaging results that were available during my care of the patient were reviewed by me and considered in my medical decision making (see MDM for details).  Medications Ordered in  ED Medications - No data to display  Procedures .Critical Care Performed by: Teressa Lower, MD Authorized by: Teressa Lower, MD   Critical care provider statement:    Critical care time (minutes):  60   Critical care was necessary to treat or prevent imminent or life-threatening deterioration of the following conditions:  Respiratory failure and circulatory failure   Critical care was time spent personally by me on the following activities:  Development of treatment plan with patient or surrogate, discussions with consultants, evaluation of patient's response to treatment, examination of patient, ordering and review of laboratory studies, ordering and review of radiographic studies, ordering and performing treatments and interventions, pulse oximetry, re-evaluation of patient's condition and review of old charts .Central Line  Date/Time: 10/11/2021 7:28 AM Performed by: Teressa Lower, MD Authorized by: Teressa Lower, MD   Consent:    Consent obtained:  Emergent situation   Consent given by:  Patient   Risks discussed:  Arterial puncture, incorrect placement, nerve damage, bleeding, infection and pneumothorax Sedation:    Sedation type:  None Anesthesia:    Anesthesia method:  Local infiltration   Local anesthetic:  Lidocaine 1% w/o epi Procedure details:    Location:  L femoral   Number of attempts:  1   Successful placement: yes   Post-procedure details:    Procedure completion:  Tolerated well, no immediate complications  (including critical care time)  Medical Decision Making / ED Course  Patient seen emergency department for evaluation of shortness of breath.  Physical exam reveals a critically ill appearing patient with dyspnea and active vomiting.  Patient arrives hypotensive in the 70s and a bedside ultrasound was obtained that shows a  severely reduced EF consistent with his history.  Patient is almost certainly not fluid responsive and may not tolerate this fluid well and thus we initiated Levophed early.  I spoke with cardiology directly and confirmed that second pressor of choice will be dobutamine.  Initial i-STAT with pH of 7.3, PCO2 34.1, potassium is 6.5, creatinine 8.7, glucose 128, ionized calcium 0.71.  Initial ECG with a paced rhythm but no bradycardia.  Peripheral access difficult and thus an emergent left femoral IJ was placed.  We initially tried to place the patient on BiPAP but he had persistent vomiting thus posing a contraindication to BiPAP and he was placed back on nonrebreather.  Patient is at extremely high risk for circulatory decompensation during rapid sequence intubation and thus we will try to avoid this if at all possible.  I spoke with nephrology and they stated they will help the ICU team initiate likely CRRT.  Patient will require admission to the ICU for persistent hypotension and electrolyte abnormalities.          Final Clinical Impression(s) / ED Diagnoses Final diagnoses:  None     @PCDICTATION @    Teressa Lower, MD 10/20/2021 [phone]

## 2021-09-28 NOTE — Progress Notes (Addendum)
[facility] Progress Note Patient Name: Jonathon Bailey DOB: February 25, 1956 MRN: [id number]   Date of Service  10/02/2021  HPI/Events of Note  Itching persists post Benadryl dose.   eICU Interventions  Plan: Benadryl 12.5 mg IV now (extra dose). Increase Benadryl dose to 12.5-25 mg IV Q 6 hours PRN itching.  Sarna Lotion PRN.     Intervention Category Major Interventions: Other:  Lysle Dingwall 10/22/2021, 9:55 PM

## 2021-09-28 NOTE — Procedures (Signed)
Arterial Catheter Insertion Procedure Note  Jonathon Bailey  [id number]  1956/09/05  Date:10/15/2021  Time:8:47 AM    Provider Performing: Esperanza Sheets T    Procedure: Insertion of Arterial Line [phone]) without US guidance  Indication(s) Blood pressure monitoring and/or need for frequent ABGs  Consent Unable to obtain consent due to emergent nature of procedure.  Anesthesia None   Time Out Verified patient identification, verified procedure, site/side was marked, verified correct patient position, special equipment/implants available, medications/allergies/relevant history reviewed, required imaging and test results available.   Sterile Technique Maximal sterile technique including full sterile barrier drape, hand hygiene, sterile gown, sterile gloves, mask, hair covering, sterile ultrasound probe cover (if used).   Procedure Description Area of catheter insertion was cleaned with chlorhexidine and draped in sterile fashion. Without real-time ultrasound guidance an arterial catheter was placed into the right radial artery.  Appropriate arterial tracings confirmed on monitor.     Complications/Tolerance None; patient tolerated the procedure well.   EBL Minimal   Specimen(s) None

## 2021-09-28 NOTE — ED Triage Notes (Addendum)
Pt arrived via [facility] from [facility] for cc of shortness of breath.Pt found sitting in bed on 3l [place] baseline, reporting shortness of breath. Pt reported to EMS increased sob x2 weeks, diarrhea x2 days, missed dialysis yesterday due to illness.   During assessment pt went unresponsive, apneic for approx 45 seconds. Pt regained consciousness. Appearing drowsy, requiring stimulation to remain conscious. Hypotensive. Pt placed on NR.    BP 94/56 HR 86 Spo2 94% 3LNC  RR 20 Etco2 18

## 2021-09-28 NOTE — Progress Notes (Signed)
eLink Physician-Brief Progress Note Patient Name: Jonathon Bailey DOB: September 05, 1956 MRN: [id number]   Date of Service  10/24/2021  HPI/Events of Note  Patient c/o itching - Patient NPO. Atarax on available IM.  eICU Interventions  Plan: Benadryl 12.5 mg IV Q 6 hours PRN itching.     Intervention Category Major Interventions: Other:  Lysle Dingwall 10/19/2021, 8:06 PM

## 2021-09-29 DIAGNOSIS — Z7189 Other specified counseling: Secondary | ICD-10-CM | POA: Diagnosis not present

## 2021-09-29 DIAGNOSIS — Z515 Encounter for palliative care: Secondary | ICD-10-CM

## 2021-09-29 DIAGNOSIS — R579 Shock, unspecified: Secondary | ICD-10-CM | POA: Diagnosis not present

## 2021-09-29 DIAGNOSIS — R0600 Dyspnea, unspecified: Secondary | ICD-10-CM

## 2021-09-29 DIAGNOSIS — E875 Hyperkalemia: Secondary | ICD-10-CM

## 2021-09-29 DIAGNOSIS — R570 Cardiogenic shock: Secondary | ICD-10-CM

## 2021-09-29 DIAGNOSIS — N179 Acute kidney failure, unspecified: Secondary | ICD-10-CM

## 2021-09-29 DIAGNOSIS — I5041 Acute combined systolic (congestive) and diastolic (congestive) heart failure: Secondary | ICD-10-CM

## 2021-09-29 DIAGNOSIS — I130 Hypertensive heart and chronic kidney disease with heart failure and stage 1 through stage 4 chronic kidney disease, or unspecified chronic kidney disease: Secondary | ICD-10-CM

## 2021-09-29 LAB — RENAL FUNCTION PANEL
Albumin: 2.4 g/dL — ABNORMAL LOW (ref 3.5–5.0)
Anion gap: 15 (ref 5–15)
BUN: 34 mg/dL — ABNORMAL HIGH (ref 8–23)
CO2: 19 mmol/L — ABNORMAL LOW (ref 22–32)
Calcium: 7 mg/dL — ABNORMAL LOW (ref 8.9–10.3)
Chloride: 99 mmol/L (ref 98–111)
Creatinine, Ser: 6.01 mg/dL — ABNORMAL HIGH (ref 0.61–1.24)
GFR, Estimated: 10 mL/min — ABNORMAL LOW (ref >60)
Glucose, Bld: 145 mg/dL — ABNORMAL HIGH (ref 70–99)
Phosphorus: 2.5 mg/dL (ref 2.5–4.6)
Potassium: 4.1 mmol/L (ref 3.5–5.1)
Sodium: 133 mmol/L — ABNORMAL LOW (ref 135–145)

## 2021-09-29 LAB — POCT I-STAT 7, (LYTES, BLD GAS, ICA,H+H)
Acid-base deficit: 1 mmol/L (ref 0.0–2.0)
Bicarbonate: 21.9 mmol/L (ref 20.0–28.0)
Calcium, Ion: 0.85 mmol/L — CL (ref 1.15–1.40)
HCT: 28 % — ABNORMAL LOW (ref 39.0–52.0)
Hemoglobin: 9.5 g/dL — ABNORMAL LOW (ref 13.0–17.0)
O2 Saturation: 99 %
Patient temperature: 97.5
Potassium: 4 mmol/L (ref 3.5–5.1)
Sodium: 136 mmol/L (ref 135–145)
TCO2: 23 mmol/L (ref 22–32)
pCO2 arterial: 29.1 mmHg — ABNORMAL LOW (ref 32.0–48.0)
pH, Arterial: 7.482 — ABNORMAL HIGH (ref 7.350–7.450)
pO2, Arterial: 143 mmHg — ABNORMAL HIGH (ref 83.0–108.0)

## 2021-09-29 LAB — GLUCOSE, CAPILLARY
Glucose-Capillary: 137 mg/dL — ABNORMAL HIGH (ref 70–99)
Glucose-Capillary: 124 mg/dL — ABNORMAL HIGH (ref 70–99)
Glucose-Capillary: 88 mg/dL (ref 70–99)
Glucose-Capillary: 89 mg/dL (ref 70–99)

## 2021-09-29 LAB — MAGNESIUM: Magnesium: 2.4 mg/dL (ref 1.7–2.4)

## 2021-09-29 LAB — CBC
HCT: 23.9 % — ABNORMAL LOW (ref 39.0–52.0)
Hemoglobin: 8.2 g/dL — ABNORMAL LOW (ref 13.0–17.0)
MCH: 25.7 pg — ABNORMAL LOW (ref 26.0–34.0)
MCHC: 34.3 g/dL (ref 30.0–36.0)
MCV: 74.9 fL — ABNORMAL LOW (ref 80.0–100.0)
Platelets: 69 10*3/uL — ABNORMAL LOW (ref 150–400)
RBC: 3.19 MIL/uL — ABNORMAL LOW (ref 4.22–5.81)
RDW: 21.4 % — ABNORMAL HIGH (ref 11.5–15.5)
WBC: 7.4 10*3/uL (ref 4.0–10.5)
nRBC: 0.5 % — ABNORMAL HIGH (ref 0.0–0.2)

## 2021-09-29 MED ORDER — SODIUM CHLORIDE 0.9 % IV SOLN
0.5000 ug/min | INTRAVENOUS | Status: DC
  Administered 2021-09-29 (×2): 5 ug/min via INTRAVENOUS
  Filled 2021-09-29: qty 10

## 2021-09-29 MED ORDER — MORPHINE SULFATE (PF) 2 MG/ML IV SOLN
INTRAVENOUS | Status: AC
  Administered 2021-09-29: 4 mg via INTRAVENOUS
  Filled 2021-09-29: qty 2

## 2021-09-29 MED ORDER — DEXTROSE 5 % IV SOLN: INTRAVENOUS | Status: DC

## 2021-09-29 MED ORDER — EPINEPHRINE HCL 5 MG/250ML IV SOLN IN NS
0.5000 ug/min | INTRAVENOUS | Status: DC
  Administered 2021-09-29: 1 ug/min via INTRAVENOUS
  Filled 2021-09-29: qty 250

## 2021-09-29 MED ORDER — MORPHINE BOLUS VIA INFUSION
5.0000 mg | INTRAVENOUS | Status: DC | PRN
  Filled 2021-09-29: qty 5

## 2021-09-29 MED ORDER — ACETAMINOPHEN 325 MG PO TABS: 650.0000 mg | ORAL_TABLET | Freq: Four times a day (QID) | ORAL | Status: DC | PRN

## 2021-09-29 MED ORDER — HEPARIN SODIUM (PORCINE) 5000 UNIT/ML IJ SOLN: 5000.0000 [IU] | Freq: Two times a day (BID) | INTRAMUSCULAR | Status: DC

## 2021-09-29 MED ORDER — GLYCOPYRROLATE 0.2 MG/ML IJ SOLN: 0.2000 mg | INTRAMUSCULAR | Status: DC | PRN

## 2021-09-29 MED ORDER — CALCIUM GLUCONATE-NACL 2-0.675 GM/100ML-% IV SOLN
2.0000 g | Freq: Once | INTRAVENOUS | Status: AC
  Administered 2021-09-29: 2000 mg via INTRAVENOUS
  Filled 2021-09-29 (×2): qty 100

## 2021-09-29 MED ORDER — ORAL CARE MOUTH RINSE
15.0000 mL | Freq: Two times a day (BID) | OROMUCOSAL | Status: DC
  Administered 2021-09-29 (×3): 15 mL via OROMUCOSAL

## 2021-09-29 MED ORDER — GLYCOPYRROLATE 1 MG PO TABS: 1.0000 mg | ORAL_TABLET | ORAL | Status: DC | PRN

## 2021-09-29 MED ORDER — MORPHINE SULFATE (PF) 2 MG/ML IV SOLN
INTRAVENOUS | Status: AC
  Filled 2021-09-29: qty 2

## 2021-09-29 MED ORDER — MORPHINE SULFATE (PF) 2 MG/ML IV SOLN
2.0000 mg | INTRAVENOUS | Status: DC | PRN
  Administered 2021-09-29: 4 mg via INTRAVENOUS

## 2021-09-29 MED ORDER — SODIUM PHOSPHATES 45 MMOLE/15ML IV SOLN
15.0000 mmol | Freq: Once | INTRAVENOUS | Status: AC
  Administered 2021-09-29: 15 mmol via INTRAVENOUS
  Filled 2021-09-29: qty 5

## 2021-09-29 MED ORDER — POLYVINYL ALCOHOL 1.4 % OP SOLN: 1.0000 [drp] | Freq: Four times a day (QID) | OPHTHALMIC | Status: DC | PRN

## 2021-09-29 MED ORDER — DIPHENHYDRAMINE HCL 50 MG/ML IJ SOLN: 25.0000 mg | INTRAMUSCULAR | Status: DC | PRN

## 2021-09-29 MED ORDER — CHLORHEXIDINE GLUCONATE 0.12 % MT SOLN
15.0000 mL | Freq: Two times a day (BID) | OROMUCOSAL | Status: DC
  Administered 2021-09-29 (×2): 15 mL via OROMUCOSAL

## 2021-09-29 MED ORDER — ALBUMIN HUMAN 25 % IV SOLN
25.0000 g | Freq: Once | INTRAVENOUS | Status: AC
  Administered 2021-09-29: 25 g via INTRAVENOUS
  Filled 2021-09-29: qty 100

## 2021-09-29 MED ORDER — DARBEPOETIN ALFA 100 MCG/0.5ML IJ SOSY
100.0000 ug | PREFILLED_SYRINGE | INTRAMUSCULAR | Status: DC
  Filled 2021-09-29: qty 0.5

## 2021-09-29 MED ORDER — MORPHINE 100MG IN NS 100ML (1MG/ML) PREMIX INFUSION
0.0000 mg/h | INTRAVENOUS | Status: DC
  Administered 2021-09-29: 5 mg/h via INTRAVENOUS
  Filled 2021-09-29: qty 100

## 2021-09-29 MED ORDER — VASOPRESSIN 20 UNITS/100 ML INFUSION FOR SHOCK
0.0400 [IU]/min | INTRAVENOUS | Status: DC
  Administered 2021-09-29 (×2): 0.04 [IU]/min via INTRAVENOUS
  Filled 2021-09-29: qty 100
  Filled 2021-09-29: qty 200

## 2021-09-29 MED ORDER — SODIUM CHLORIDE 0.9% FLUSH: 10.0000 mL | INTRAVENOUS | Status: DC | PRN

## 2021-09-29 MED ORDER — SODIUM CHLORIDE 0.9% FLUSH: 10.0000 mL | Freq: Two times a day (BID) | INTRAVENOUS | Status: DC

## 2021-09-29 MED ORDER — ACETAMINOPHEN 650 MG RE SUPP: 650.0000 mg | Freq: Four times a day (QID) | RECTAL | Status: DC | PRN

## 2021-10-03 LAB — CULTURE, BLOOD (ROUTINE X 2): Culture: NO GROWTH

## 2021-10-25 ENCOUNTER — Encounter: Payer: Medicare Other | Admitting: Cardiology

## 2021-10-25 NOTE — Progress Notes (Addendum)
[place] Progress Note Patient Name: Jonathon Bailey DOB: February 26, 1956 MRN: [id number]   Date of Service  10-17-21  HPI/Events of Note  Hypotension - BP = 93/59 with MAP = 67 on a Norepinephrine IV infusion at 40 mcg/min. Hgb = 8.2. Last pH = 7.482. Ionized Ca++ = 0.85.   eICU Interventions  Plan: Monitor CVP now and Q 4 hours. Increase ceiling on Norepinephrine IV infusion to 60 mcg/min. Will replace Ca++.     Intervention Category Major Interventions: Hypertension - evaluation and management  [name] Eugene 10/17/2021, 6:34 AM

## 2021-10-25 NOTE — Progress Notes (Signed)
This chaplain responded to unit page for Pt. and family spiritual care as the Pt. health declines.   The chaplain was updated by the Pt. RN-Don before the visit. The Pt. mother and sister are at the bedside. The Pt. anxiety declines as the RN places the bipap back on the Pt.   The family accepted the chaplain's invitation for prayer after the Pt. became more comfortable. The chaplain understands the family is depending on their faith for guidance.  Chaplain Sallyanne Kuster [phone]

## 2021-10-25 NOTE — Progress Notes (Signed)
Chaplains Emogene Morgan and Wynetta Emery responded to page requesting support of pt's family at time of his imminent death.  Chaplains offered ministry of care and consolation as family came to grips with the seriousness of their loved one's condition.  Chaplains continued ministry with various members of the family gathered as they transitioned from shock to understanding to acceptance,  then to a place of effectively ministering to one another at the time of death. Chaplain available for additional family support if needed.   [place]

## 2021-10-25 NOTE — Progress Notes (Signed)
Pt placed back on bipap at this time for pt comfort. SpO2 not picking up at this time, pt with increased work of breathing. RT will continue to monitor and be available as needed.

## 2021-10-25 NOTE — Progress Notes (Signed)
NAME:  Jonathon Bailey, MRN:  [id number], DOB:  12/21/1955, LOS: 1 ADMISSION DATE:  10/12/2021, CONSULTATION DATE: 10/09/2021 REFERRING MD: Emergency department physician, CHIEF COMPLAINT: Shock  History of Present Illness:  Mr. Stiehl is a 66 year old male with a plethora of health issues with well-documented below and who presents with shock, hyperkalemia nausea vomiting diarrhea and has missed hemodialysis for period of 6 days.  His potassium is greater than 6 is been treated with bicarbonate, calcium insulin and D50.  He is currently on dobutamine per cardiology and Levophed per emergency department physician.  Intubation has been avoided due to the fact that due to his tenuous status will most likely code during intubation.  Therefore our goal is to get him to the intensive care unit soon as possible and initiate CRRT to correct his electrolyte disturbances.  We will check a procalcitonin to rule out infection with no antimicrobial therapy has been started at this time.  Pertinent  Medical History   Past Medical History:  Diagnosis Date   Anxiety    Arthritis    Asthma    "as a child"   CAD (coronary artery disease)    Chronic systolic heart failure ([place]) 09/21/2008   ECHO Feb 2013 showed LVEF low normal at 50-55%, +hypokinetic anterolateral wall and inferolateral wall.     CKD (chronic kidney disease) stage 4, GFR 15-29 ml/min (HCC) 08/30/2009   Progressive renal failure since 2008, creatinine 1.2 in 2008 up to 3.5 in 2012 and 3.2-5.0 in 2013. All UA's 2011-13 showed >300 protein on dipstick. Work-up in May 2011 showed negative Urine IFE and SPEP, ultrasound showed 12-13 cm kidneys with increased echogenicity and UPC ratio was 1.5 gm proteinuria.  Hgb A1C's from 2011 to 2013 were all between 9-11.  Patient saw Dr. Donnetta Hutching (vasc surgery) for HD access in Aug 2013 > vein mapping was done and Dr. Donnetta Hutching felt the left arm (pt is R handed) was suitable for L arm Cimino radiocephalic fistula. Patient  said he wasn't ready to consider doing dialysis and declined the surgery.      Depression    Diabetic retinopathy ([place])    ESRD (end stage renal disease) on dialysis ([place])    Headache(784.0)    "maybe monthly" (03/15/2014)   Hyperlipidemia    Hypertension    Macular edema    Myocardial infarction [facility])    status post MI x2 and 3 stents placed in 2003   Obesity    Sleep apnea    does not use CPAP   Stroke ([place]) ~ 2007; ~1987   "weak on right side; messed w/right side of brain; cry all the time"   Type II diabetes mellitus ([place])    insulin dependent     Significant Hospital Events: Including procedures, antibiotic start and stop dates in addition to other pertinent events   10/10/2021 right femoral CVL placed 10/22/2021 right radial A-line Left internal jugular Vas-Cath is in place  Interim History / Subjective:  Worsening pressor requirements.  Objective   Blood pressure (!) 73/47, pulse 99, temperature 97.9 F (36.6 C), temperature source Axillary, resp. rate (!) 24, height 5\' 11"  (1.803 m), weight 104.9 kg, SpO2 99 %. 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 extremity venous stasis poor blood flow multiple areas of the skin are noted Neuro: Does not follow commands GU: End-stage renal disease  Ill appearing man in mild resp distress Diminished breath sounds to auscultation Heart sounds paced, ext  lukewarm L BKA, RLE PVD changes Answering questions today  Resolved Hospital Problem list     Assessment & Plan:  Worsening multiorgan failure and profound shock in context of 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 options per cardiology ESRD on CRRT Hypocalcemia DM2 with hyperglycemia Pacemaker dependence Chronic pancytopenia Failure to thrive Prior CVA  - RN to call family in - Patient currently not in pain, low threshold to initiate opiates PRN pain - He would not do well on life support, will discuss GOC with him and family when they come in - CRRT had to be stopped due to profound hypotension - Replete Ca, titrate pressors/inotropes to MAP 65 - SSI   Best Practice (right click and "Reselect all SmartList Selections" daily)   Diet/type: NPO DVT prophylaxis:  GI prophylaxis: PPI Lines: Central line Foley:  Yes, and it is still needed Code Status:  full code Last date of multidisciplinary goals of care discussion [today]   Patient critically ill due to shock Interventions to address this today pressor titration Risk of deterioration without these interventions is high  I personally spent 36 minutes providing critical care not including any separately billable procedures  Erskine Emery MD [facility]  Prefer epic messenger for cross cover needs If after hours, please call E-link

## 2021-10-25 NOTE — Progress Notes (Signed)
Met with son and daughter given worsening mental status of patient.  Will continue current level of support but if he deteriorates allow to pass in peace.  Erskine Emery MD PCCM

## 2021-10-25 NOTE — Progress Notes (Signed)
[facility] Progress Note  Name: Jonathon Bailey MRN: [id number] DOB: May 29, 1956  Chief Complaint:  Shortness of breath and altered mental status  Subjective:  he was brought up to the ICU and initiated on CRRT.  He pulled net neg 50 ml/hr as tolerated initially but then just recently his pressures dropped and levo has been titrated to 60 mcg/min and dobutamine has been at 20 mcg/min.  He had 1.2 liters UF over 1/5 with CRRT.  93/57 on my exam.  He has been on BIPAP.  He has been more alert per nursing  Review of systems:  Limited by bipap/resp status  Still short of breath  ------------ Background on consult:   Jonathon Bailey is a 66 y.o. male with ESRD on dialysis at [facility], now on MWF schedule. PMH: ESRD D/T  FSGS, HFrEF, CAD S/P CABG, high grade AVB S/P leadless PPM. AS/severe TR, HTN, obesity, DMT2. Recent Admission/discharge from [facility] 12/23-12/30/2022 with volume overload D/T noncompliance with dialysis, EF <20% nonischemic cardiomyopathy mediated by 100% RV pacing. Plans were made to follow up EP for consideration of BiV ICD placement. He was discharged to SNF, never returned to HD unit for dialysis.  Patient returned to ED this AM with SOB. Was very lethargic upon arrival with brief interval of being unresponsive and apneic.  Noted to be hypotensive,minimally responsive. K+ 6.6 hemolyzed. Na 130 CO2 14 SCr 8.57 BUN 48. Initial AGAS pH 7.30 PCO2 34 Po2 114/ He was started on BIPAP.  BP 85/45. Levophed started,currently at 20 mcg/min. Dobutamine at 5 mcg/kg/min. He has been seen by PCM. Plans to admit to ICU for multifactorial shock. We will start CRRT upon arrival to ICU.  Seen in ED. Minimally responsive. Mumbles in response to questions. Unable to obtain HPI from patient. HPI gathered from EMR and ED staff. He has generalized anasarca present, LE, hips, abdomen, sides of chest. Paced rhythm on monitor.   Intake/Output Summary (Last 24 hours) at  10-13-21 0651 Last data filed at 10/13/2021 0600 Gross per 24 hour  Intake 1840.02 ml  Output 1213 ml  Net 627.02 ml    Vitals:  Vitals:   10/13/2021 0530 October 13, 2021 0545 10/13/21 0600 10/13/2021 0615  BP:  (!) 73/47    Pulse:      Resp: 19 (!) 22 (!) 31 (!) 24  Temp:      TempSrc:      SpO2: 94% 100% 90% 99%  Weight:      Height:         Physical Exam:  General adult male in bed on BIPAP critically ill  HEENT normocephalic atraumatic extraocular movements intact sclera anicteric Neck supple trachea midline Lungs obscured by BIPAP; increased work of breathing with exertion   Heart S1S2 no rub Abdomen soft nontender pitting edema of abdominal wall  Extremities pitting edema residual limb/lower extremity   Neuro - awake and oriented to person and year, basics of situation   Access: left IJ tunn catheter  Medications reviewed   Labs:  BMP Latest Ref Rng & Units 10-13-2021 10/13/2021 10/18/2021  Glucose 70 - 99 mg/dL 145(H) - -  BUN 8 - 23 mg/dL 34(H) - -  Creatinine 0.61 - 1.24 mg/dL 6.01(H) - -  Sodium 135 - 145 mmol/L 133(L) 136 134(L)  Potassium 3.5 - 5.1 mmol/L 4.1 4.0 4.2  Chloride 98 - 111 mmol/L 99 - -  CO2 22 - 32 mmol/L 19(L) - -  Calcium 8.9 - 10.3 mg/dL  7.0(L) - -   Dialysis Orders: Center: [facility] MWF 4.5 hrs 250NRe 400/800 EDW 112 kg 2.0 K/ 2.0 Ca UFP 2 TDC -Heparin 12000 Heparin 12000 units IV TIW -Hectorol 3 mcg IV TIW -Mircera 30 mcg IV q 2 weeks (no recent doses at OP center)  Assessment/Plan:   Shock - multifactorial including cardiogenic  - on pressors per pulm    Heart failure reduced EF  - last EF < 20 %  - Nonischemic Cardiomyopathy: Mediated by RV pacing. Was to F/U with EP as OP for BiV ICD.  - on dobutamine; optimize volume with CRRT    Hyperkalemia - hemolyzed initially; resolved   ESRD -  - Continue CRRT.  - 4K bath. Goal net negative 50 ml/hr as tolerated  - albumin once now   Acute hypoxic resp failure  - optimize volume with CRRT.  Per  pulm. He is on BIPAP currently    Anemia CKD  - Start aranesp 100 mcg weekly on fridays.  Transfusion per primary team    Metabolic bone disease - no binders while on CRRT. Low dose phos now. Critical care ordered calcium   Dispo - critically ill in ICU  Claudia Desanctis, MD 10-15-2021 7:20 AM

## 2021-10-25 NOTE — Consult Note (Signed)
Palliative Care Consult Note                                  Date: 10/26/21   Patient Name: Jonathon Bailey  DOB: 10-08-1955  MRN: [id number]  Age / Sex: 66 y.o., male  PCP: [facility] Referring Physician: Candee Furbish, MD  Reason for Consultation: Establishing goals of care  HPI/Patient Profile: 66 y.o. male  with past medical history of anxiety, CAD, chronic systolic heart failure, ESRD on HD, diabetes, sleep apnea, CVA with residual right-sided weakness, and others admitted on 10/19/2021 with shock, hyperkalemia, nausea, vomiting, diarrhea status post missing hemodialysis for 6 days.  On admission his potassium was greater than 6.  Have avoided intubation due to tenuous status and would most likely code during intubation.  He was transferred to the ICU and is on multiple pressors, BiPAP.  CRRT was started this morning to correct potassium but has since been stopped due to blood pressure.  Previously PMT met with the patient on 09/21/2021 at which point he stated that he wanted "everything done" including full code.  He seems quite adamant about this.  PMT has been consulted for goals of care conversations.  Past Medical History:  Diagnosis Date   Anxiety    Arthritis    Asthma    "as a child"   CAD (coronary artery disease)    Chronic systolic heart failure ([place]) 09/21/2008   ECHO Feb 2013 showed LVEF low normal at 50-55%, +hypokinetic anterolateral wall and inferolateral wall.     CKD (chronic kidney disease) stage 4, GFR 15-29 ml/min (HCC) 08/30/2009   Progressive renal failure since 2008, creatinine 1.2 in 2008 up to 3.5 in 2012 and 3.2-5.0 in 2013. All UA's 2011-13 showed >300 protein on dipstick. Work-up in May 2011 showed negative Urine IFE and SPEP, ultrasound showed 12-13 cm kidneys with increased echogenicity and UPC ratio was 1.5 gm proteinuria.  Hgb A1C's from 2011 to 2013 were all between 9-11.  Patient  saw Dr. Donnetta Hutching (vasc surgery) for HD access in Aug 2013 > vein mapping was done and Dr. Donnetta Hutching felt the left arm (pt is R handed) was suitable for L arm Cimino radiocephalic fistula. Patient said he wasn't ready to consider doing dialysis and declined the surgery.      Depression    Diabetic retinopathy ([place])    ESRD (end stage renal disease) on dialysis ([place])    Headache(784.0)    "maybe monthly" (03/15/2014)   Hyperlipidemia    Hypertension    Macular edema    Myocardial infarction [facility])    status post MI x2 and 3 stents placed in 2003   Obesity    Sleep apnea    does not use CPAP   Stroke ([place]) ~ 2007; ~1987   "weak on right side; messed w/right side of brain; cry all the time"   Type II diabetes mellitus (HCC)    insulin dependent    Subjective:   This NP Walden Field reviewed medical records, received report from team, assessed the patient and then meet at the patient's bedside to discuss diagnosis, prognosis, GOC,  in the day and they had not returned.  Nursing has my phone number to call when they return. °  °Concept of Palliative Care was introduced as specialized medical care for people and their families living with serious illness.  If focuses on providing relief from the symptoms and stress of a serious illness.  The goal is to improve quality of life for both the patient and the family. Values and goals of care important to patient and family were attempted to be elicited. ° °Created space and opportunity for patient  and family to explore thoughts and feelings regarding current medical situation °  °Natural trajectory and current clinical status were discussed. Questions and concerns addressed. Patient  encouraged to call  with questions or concerns.   ° °Patient/Family Understanding of Illness: °Deferred for now ° °Life Review: °Deferred for now ° °Patient Values: °Deferred for now ° °Goals: °Survival (per the patient's verbalizations) ° °Today's Discussion: °Conversation with the patient was limited due to BiPAP and attempts to prevent agitation and worsening acute situation.  He did confirm that he wants full code, full scope.  This includes CPR, intubation, defibrillation.  He understands that he is very sick.  These goals are consistent with previous discussions with palliative medicine at the end of December of last year. ° °I discussed with nursing his current status.  They are also quite concerned about his clinical situation.  They emphasized his multiple pressors, as described above.  They have made family aware of the tenuous situation. ° °ROS limited due to acuity of illness °Review of Systems  °Constitutional:   °     Denies pain in general, denies dyspnea in general  ° °Objective:  ° °Primary Diagnoses: °Present on Admission: °• Shock (HCC) ° ° °Physical Exam °Vitals and nursing note reviewed.  °Constitutional:   °   Appearance: He is ill-appearing and toxic-appearing.  °HENT:  °   Head: Normocephalic and atraumatic.  °Cardiovascular:  °   Rate and Rhythm: Tachycardia present. Rhythm irregular.  °Pulmonary:  °   Effort: Pulmonary effort is normal. No respiratory distress.  °   Comments: On BiPap °Abdominal:  °   General: Abdomen is flat.  °   Palpations: Abdomen is soft.  °   Tenderness: There is no abdominal tenderness.  °Skin: °   Coloration: Skin is cyanotic.  °   Comments: LLE w/ amputation. RLE with cold/blue toes/feet. Noted closed ulcer/wounds on toes. Hand also cool/cold. Bear hugger in place  °Neurological:  °   Mental Status: He is alert.  °Psychiatric:     °   Mood and Affect: Mood is anxious.  ° ° °Vital Signs:  °BP (!) 73/53    Pulse 100    Temp 97.8 °F (36.6 °C) (Axillary)    Resp (!) 30    Ht 5' 11" (1.803  m)    Wt 104.9 kg    SpO2 (!) 67%    BMI 32.25 kg/m²  ° °Palliative Assessment/Data: 10-20% ° ° ° °Advanced Care Planning:  ° °Primary Decision Maker: °PATIENT ° °Code Status/Advance Care Planning: °Full code ° °A discussion was had today regarding advanced directives. Concepts specific to code status, artifical feeding and hydration, continued IV antibiotics and rehospitalization was had.  The difference between a aggressive medical intervention path and a palliative comfort care path for this patient at this time was had.  ° °Decisions/Changes to ACP: °No changes today °Remain full code, full scope per patient expression of wishes ° °  continued IV antibiotics and rehospitalization was had.  The difference between a aggressive medical intervention path and a palliative comfort care path for this patient at this time was had.   Decisions/Changes to ACP: No changes today Remain full code, full scope per patient expression of wishes  Assessment & Plan:   Impression: Critically ill 66 year old male admitted with acute on chronic heart failure, ESRD not tolerating dialysis due to pressures.  He is adamant about remaining a full code, full scope.  Currently on multiple pressors including Levophed 100 mics per minute, vasopressin 0.04 units/min, epinephrine 5 mcg/min, dobutamine 20 mcg/min.  As described above his distal extremities are cool and cyanotic likely from high-dose pressors.  Patient is intermittently agitated.  However, he is awake and alert and is able to verbalize his decisions.  Overall short-term and long-term prognosis is very poor.  Family is aware of the severity of his illness.  SUMMARY OF RECOMMENDATIONS   Continue full scope, full code per patient wishes Continue emotional and spiritual support of patient and family PMT will continue to follow  Symptom Management:  Per primary team PMT is available to assist as needed  Prognosis:  Unable to determine  Discharge Planning:  To Be Determined   Discussed with: Medical team, nursing team, patient, family   Thank you for allowing [place] to participate in the care of MARRION ACCOMANDO PMT will continue to support holistically.  MDM: The patient has an acute illness/exacerbation that poses a threat to life, decision after  discussion with the patient to continue aggressive care/escalation of care, discussed current management plan with medical team, nursing team.  Signed by: Walden Field, NP Palliative Medicine Team  Team Phone # [phone] (Nights/Weekends)  10/02/2021, 2:16 PM

## 2021-10-25 NOTE — Progress Notes (Signed)
Subjective:  Somnolent this morning  Objective:  Vital Signs in the last 24 hours: Temp:  [96.2 F (35.7 C)-97.9 F (36.6 C)] 97.9 F (36.6 C) (01/06 0725) Pulse Rate:  [58-99] 99 (01/05 1800) Resp:  [11-31] 24 (01/06 0615) BP: (73-111)/(47-83) 73/47 (01/06 0545) SpO2:  [78 %-100 %] 99 % (01/06 0615) Arterial Line BP: (69-117)/(45-79) 86/51 (01/06 0615) Weight:  [104.9 kg] 104.9 kg (01/06 0500)  Intake/Output from previous day: 01/05 0701 - 01/06 0700 In: 1919 [I.V.:1828.3; IV Piggyback:90.7] Out: 1273   Physical Exam Vitals and nursing note reviewed.  Constitutional:      General: He is not in acute distress.    Appearance: He is well-developed. He is ill-appearing.     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  Eyes:     Conjunctiva/sclera: Conjunctivae normal.     Pupils: Pupils are equal, round, and reactive to light.  Neck:     Vascular: No JVD.  Cardiovascular:     Rate and Rhythm: Normal rate and regular rhythm.     Pulses: Intact distal pulses.     Heart sounds: No murmur heard. Pulmonary:     Effort: Respiratory distress (Mild. On BiPAP) present.     Breath sounds: Decreased air movement present. No wheezing or rales.  Abdominal:     General: Bowel sounds are normal.     Palpations: Abdomen is soft.     Tenderness: There is no rebound.  Musculoskeletal:        General: No tenderness. Normal range of motion.     Right lower leg: Right lower leg edema: 1+.     Left lower leg: No edema.     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  Lymphadenopathy:     Cervical: No cervical adenopathy.  Skin:    General: Skin is warm and dry.  Neurological:     Mental Status: He is alert.     Comments: Difficult to arouse     Lab Results: BMP Recent Labs    10/10/2021 0644 10/06/2021 0651 09/25/2021 0658 10/14/2021 1334 10/19/2021 1654 10-13-21 0429 October 13, 2021 0433  NA 130* 131*   < > 134*   132* 134* 136 133*  K 6.6*  6.5*   < > 4.1   4.2 4.2 4.0 4.1  CL 96* 101  --  95*   95*  --   --  99  CO2 14*  --   --  18*   18*  --   --  19*  GLUCOSE 134* 128*  --  198*   198*  --   --  145*  BUN 48* 74*  --  52*   52*  --   --  34*  CREATININE 8.57* 8.70*  --  8.79*   8.78*  --   --  6.01*  CALCIUM 6.9*  --   --  6.8*   6.9*  --   --  7.0*  GFRNONAA 6*  --   --  6*   6*  --   --  10*   < > = values in this interval not displayed.    CBC Recent Labs  Lab 10/11/2021 0644 10/10/2021 0651 10/13/21 0433  WBC 8.5   < > 7.4  RBC 3.29*   < > 3.19*  HGB 8.1*   < > 8.2*  HCT 25.6*   < > 23.9*  PLT 81*   < >  69*  MCV 77.8*   < > 74.9*  MCH 24.6*   < > 25.7*  MCHC 31.6   < > 34.3  RDW 22.6*   < > 21.4*  LYMPHSABS 1.5  --   --   MONOABS 1.4*  --   --   EOSABS 0.1  --   --   BASOSABS 0.0  --   --    < > = values in this interval not displayed.    HEMOGLOBIN A1C Lab Results  Component Value Date   HGBA1C 5.5 09/17/2021   MPG 111.15 09/17/2021    Cardiac Panel (last 3 results) No results for input(s): CKTOTAL, CKMB, TROPONINI, RELINDX in the last 8760 hours.  BNP (last 3 results) No results for input(s): BNP in the last 8760 hours.  TSH No results for input(s): TSH in the last 8760 hours.  Lipid Panel     Component Value Date/Time   CHOL 142 04/29/2020 0428   TRIG 177 (H) 04/29/2020 0428   HDL 28 (L) 04/29/2020 0428   CHOLHDL 5.1 04/29/2020 0428   VLDL 35 04/29/2020 0428   LDLCALC 79 04/29/2020 0428   LDLDIRECT 115 (H) 05/23/2012 1419     Hepatic Function Panel Recent Labs    09/15/21 1826 09/16/21 0427 09/16/21 1507 09/26/2021 0644 10/04/2021 1334 10-13-2021 0433  PROT 7.5 7.4  --  7.0  --   --   ALBUMIN 2.7* 2.6*   < > 2.5* 2.5* 2.4*  AST 49* 54*  --  299*  --   --   ALT 30 33  --  65*  --   --   ALKPHOS 147* 142*  --  231*  --   --   BILITOT 2.1* 2.0*  --  3.1*  --   --    < > = values in this interval not displayed.    Imaging: CXR 09/30/2021: Reaccumulation of right-sided pleural  fluid since recent thoracentesis with moderate pleural effusion present. Probable mild underlying chronic pulmonary interstitial edema.    Cardiac Studies:  EKG 09/24/2021: Ventricular paced rhythm 95 bpm   RHC/LHC 09/19/2021: Hemodynamics: RA 25/34, mean 27 mmHg.  RA saturation 63%. RV 41/15, EDP 25 mmHg. PA 45/28, mean 36 mmHg.  PA saturation 56%.  Aortic saturation 95%. CO 7.52, CI 3.32.  SVR elevated at 1179. LV 100/19, EDP 29 mmHg.  Ao 89/63, mean 76 mmHg.  No pressure gradient across the aortic valve.   Angiographic data: LM: Occluded. LAD: Supplied by LIMA to LAD which is widely patent. CX: Supplied by SVG to OM1 which is widely patent. RCA: Occluded in the proximal segment.  Supplied by SVG to RCA.  RCA small.  Recommendation: Patient has nonischemic cardiomyopathy.  Could be induced by asynchronous AV pacing and hence may need leadless pacemaker explantation and BiV ICD placement consideration.   Echocardiogram 09/17/2021:  1. Left ventricular ejection fraction, by estimation, is <20%. The left  ventricle has severely decreased function. The left ventricle demonstrates  global hypokinesis with significant septal dyskinesis. There is mild left  ventricular hypertrophy. Left ventricular diastolic parameters are indeterminate. Elevated left ventricular end-diastolic pressure.   2. Right ventricular systolic function is moderately reduced. The right  ventricular size is moderately enlarged. There is mildly elevated  pulmonary artery systolic pressure. The estimated right ventricular  systolic pressure is 26.7 mmHg.   3. Left atrial size was mildly dilated.   4. There is a trivial pericardial effusion posterior to the left  ventricle. Left  pleural effusion present.   5. The mitral valve is abnormal. Mild mitral valve regurgitation.  Moderate mitral annular calcification.   6. Tricuspid valve regurgitation is severe.   7. The aortic valve is tricuspid. There is severe  calcifcation of the  aortic valve. Aortic valve regurgitation is not visualized. Possible  severe low gradient aortic valve stenosis versus pseudo-stenosis in the  setting of markedly reduced LVEF. Aortic valve mean gradient measures 5.0 mmHg. Dimentionless index 0.31.   8. The inferior vena cava is dilated in size with <50% respiratory  variability, suggesting right atrial pressure of 15 mmHg.    Echocardiogram 06/09/2021: The left ventricular size is normal with normal left ventricular wall  thickness.  Left ventricular systolic function is mildly reduced; LVEF = 40-45%.  Abnormal (paradoxical) septal motion consistent with LBBB.  The right ventricle is moderately dilated. The right ventricular  systolic function is mildly reduced.  There is overall probably moderate calcific aortic stenosis; peak  velocity 2.2 m/s, mean gradient 9 mmHg, calculated AVA ?? Severe functional tricuspid regurgitation due to RV dilation.  Unable to compare to 2015 but by report, the LVEF is lower, the RV is  dilated, and the TR is now severe.      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 Medical treatment at baseline limited due to hypotension and ESRD. Remains in cardiogenic shock on high doses of requiring norepinephrine and dobutamine. Unfortunately, he is not a good candidate for any advanced heart failure treatment options. Overall prognosis is guarded. Continue dobutamine and norepinephrine, as tolerated.   Acute hypoxic  respiratory failure: Likely due to missed dialysis, in addition to acute systolic heart failure, as well as pleural effusion CRRT as per nephrology recommendations, as tolerated Management of respiratory failure as per CCM. Remains on BiPAP   CAD: S/p CABG. Patent grafts on cath 09/19/2021.    Nigel Mormon, MD Pager: [phone] Office: [phone]

## 2021-10-25 NOTE — Progress Notes (Signed)
Now comatose, agonal breathing.  Bps in 40s on 100 levophed, 10 epinephrine, 0.04 vasopressin, 20 dobutamine.  Received compressions, family distraught.  Daughter at bedside, wishes him to be allowed to pass peacefully.  DC pressors. Morphine push and drip titrated to RR < 16  Erskine Emery MD PCCM

## 2021-10-25 NOTE — Death Summary Note (Signed)
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  Brief Hospital Course (including significant findings, care, treatment, and services provided and events leading to death)   Presented with worsening SOB, somnolence found to be in decompensated heart and renal failure.    Started on CRRT within a few hours of arrival but went into such profound shock that it could not be continued.  Family arrived and after several discussions including brief round    Pertinent Labs and Studies  Significant Diagnostic Studies DG Chest 1 View  Result Date: 09/16/2021 CLINICAL DATA:  Post thoracentesis. EXAM: CHEST  1 VIEW COMPARISON:  09/15/2021 (CT and chest x-ray) FINDINGS: Stable cardiac silhouette.  Large 4 central venous line. Interval decrease in RIGHT pleural effusion. Moderate RIGHT pleural effusion remains. No pneumothorax. LEFT effusion also noted unchanged. IMPRESSION: Mild decrease in RIGHT pleural effusion.  No pneumothorax. Electronically Signed   By: Suzy Bouchard M.D.   On: 09/16/2021 12:43   CARDIAC CATHETERIZATION  Addendum Date: 09/20/2021     Mid LM to Ost LAD lesion is 100% stenosed.   Ost RCA to Prox RCA lesion is 100% stenosed.   and is small.   LV end diastolic pressure is severely elevated.   Hemodynamic findings consistent with  moderate pulmonary hypertension. Procedures performed 09/19/2021: Right and left heart catheterization, graft angiography. Foreign body retrieval, microwire retrieval from the right common femoral artery access. Access closure with Perclose of the right common femoral artery. Please see procedure note regarding loss of microwire used for micropuncture access, there was double entry through the skin to get arterial access due to large body habitus and large pannus, while I was trying to retrieve the microwire by gently pushing it to the site of entry to the artery, loss of wire position and also wire into the subcutaneous tissue.  Successfully snared with En-Snare system. Hemodynamics: RA 25/34, mean 27 mmHg.  RA saturation 63%. RV 41/15, EDP 25 mmHg. PA 45/28, mean 36 mmHg.  PA saturation 56%.  Aortic saturation 95%. CO 7.52, CI 3.32.  SVR elevated at 1179. LV 100/19, EDP 29 mmHg.  Ao 89/63, mean 76 mmHg.  No pressure gradient across the aortic valve. Angiographic data: LM: Occluded. LAD: Supplied by LIMA to LAD which is widely patent. CX: Supplied by SVG to OM1 which is widely patent. RCA: Occluded in the proximal segment.  Supplied by SVG to RCA.  RCA small. Recommendation: Patient has nonischemic cardiomyopathy.  Could be induced by asynchronous AV pacing and hence may need leadless pacemaker explantation and BiV ICD placement consideration.  Result Date: 09/20/2021   Mid LM to Ost LAD lesion is 100% stenosed.   Ost RCA to Prox RCA lesion is 100% stenosed.   and is small.   LV end diastolic pressure is severely elevated.   Hemodynamic findings consistent with moderate pulmonary hypertension. Procedures performed 01/18/2021: Right  and left heart catheterization, graft angiography. Foreign body retrieval, microwire retrieval from the right common femoral artery access. Access closure with Perclose of the right common femoral artery. Please see procedure note regarding loss of microwire used for micropuncture  access, there was double entry through the skin to get arterial access due to large body habitus and large pannus, while I was trying to retrieve the microwire by gently pushing it to the site of entry to the artery, loss of wire position and also wire into the subcutaneous tissue.  Successfully snared with En-Snare system. Hemodynamics: RA 25/34, mean 27 mmHg.  RA saturation 63%. RV 41/15, EDP 25 mmHg. PA 45/28, mean 36 mmHg.  PA saturation 56%.  Aortic saturation 95%. CO 7.52, CI 3.32.  SVR elevated at 1179. LV 100/19, EDP 29 mmHg.  Ao 89/63, mean 76 mmHg.  No pressure gradient across the aortic valve. Angiographic data: LM: Occluded. LAD: Supplied by LIMA to LAD which is widely patent. CX: Supplied by SVG to OM1 which is widely patent. RCA: Occluded in the proximal segment.  Supplied by SVG to RCA.  RCA small. Recommendation: Patient has nonischemic cardiomyopathy.  Could be induced by asynchronous AV pacing and hence may need leadless pacemaker explantation and BiV ICD placement consideration.   PERIPHERAL VASCULAR CATHETERIZATION  Addendum Date: 09/20/2021     Mid LM to Ost LAD lesion is 100% stenosed.   Ost RCA to Prox RCA lesion is 100% stenosed.   and is small.   LV end diastolic pressure is severely elevated.   Hemodynamic findings consistent with moderate pulmonary hypertension. Procedures performed 09/19/2021: Right and left heart catheterization, graft angiography. Foreign body retrieval, microwire retrieval from the right common femoral artery access. Access closure with Perclose of the right common femoral artery. Please see procedure note regarding loss of microwire used for micropuncture access, there was double entry through the skin to get arterial access due to large body habitus and large pannus, while I was trying to retrieve the microwire by gently pushing it to the site of entry to the artery, loss of wire position and also wire into the subcutaneous tissue.  Successfully snared with  En-Snare system. Hemodynamics: RA 25/34, mean 27 mmHg.  RA saturation 63%. RV 41/15, EDP 25 mmHg. PA 45/28, mean 36 mmHg.  PA saturation 56%.  Aortic saturation 95%. CO 7.52, CI 3.32.  SVR elevated at 1179. LV 100/19, EDP 29 mmHg.  Ao 89/63, mean 76 mmHg.  No pressure gradient across the aortic valve. Angiographic data: LM: Occluded. LAD: Supplied by LIMA to LAD which is widely patent. CX: Supplied by SVG to OM1 which is widely patent. RCA: Occluded in the proximal segment.  Supplied by SVG to RCA.  RCA small. Recommendation: Patient has nonischemic cardiomyopathy.  Could be induced by asynchronous AV pacing and hence may need leadless pacemaker explantation and BiV ICD placement consideration.  Result Date: 09/20/2021   Mid LM to Ost LAD lesion is 100% stenosed.   Ost RCA to Prox RCA lesion is 100% stenosed.   and is small.   LV end diastolic pressure is severely elevated.   Hemodynamic findings consistent with moderate pulmonary hypertension. Procedures performed 01/18/2021: Right and left heart catheterization, graft angiography. Foreign body retrieval, microwire retrieval from the right common femoral artery access. Access closure with Perclose of the right common femoral artery. Please see procedure note regarding loss of microwire used for micropuncture access, there was double entry through the skin to get arterial access due to  large body habitus and large pannus, while I was trying to retrieve the microwire by gently pushing it to the site of entry to the artery, loss of wire position and also wire into the subcutaneous tissue.  Successfully snared with En-Snare system. Hemodynamics: RA 25/34, mean 27 mmHg.  RA saturation 63%. RV 41/15, EDP 25 mmHg. PA 45/28, mean 36 mmHg.  PA saturation 56%.  Aortic saturation 95%. CO 7.52, CI 3.32.  SVR elevated at 1179. LV 100/19, EDP 29 mmHg.  Ao 89/63, mean 76 mmHg.  No pressure gradient across the aortic valve. Angiographic data: LM: Occluded. LAD: Supplied by LIMA  to LAD which is widely patent. CX: Supplied by SVG to OM1 which is widely patent. RCA: Occluded in the proximal segment.  Supplied by SVG to RCA.  RCA small. Recommendation: Patient has nonischemic cardiomyopathy.  Could be induced by asynchronous AV pacing and hence may need leadless pacemaker explantation and BiV ICD placement consideration.   DG Chest Portable 1 View  Result Date: 10/14/2021 CLINICAL DATA:  Shortness of breath, unresponsive, apnea and hypotension. Status post right thoracentesis on 09/16/2021 EXAM: PORTABLE CHEST 1 VIEW COMPARISON:  09/16/2021 FINDINGS: Stable cardiac enlargement and positioning left jugular tunneled dialysis catheter. Since the post thoracentesis x-ray there has been some reaccumulation of right-sided pleural fluid with a moderate pleural effusion present. No significant left-sided pleural fluid. Probable mild underlying chronic pulmonary interstitial edema. No significant focal airspace consolidation. IMPRESSION: Reaccumulation of right-sided pleural fluid since recent thoracentesis with moderate pleural effusion present. Probable mild underlying chronic pulmonary interstitial edema. Electronically Signed   By: Aletta Edouard M.D.   On: 10/16/2021 07:47   DG Chest Port 1 View  Result Date: 09/15/2021 CLINICAL DATA:  Shortness of breath EXAM: PORTABLE CHEST 1 VIEW COMPARISON:  Chest radiograph dated December 03, 2020 FINDINGS: The heart is markedly enlarged. Pulmonary vascular congestion. Moderate bilateral pleural effusions. Dialysis catheter with distal tip in the right atrium. IMPRESSION: Cardiomegaly with pulmonary vascular congestion and moderate bilateral pleural effusions, suggesting volume overload/pulmonary edema. Left access dialysis catheter with distal tip in the right atrium. Electronically Signed   By: Keane Police D.O.   On: 09/15/2021 18:00   ECHOCARDIOGRAM COMPLETE  Result Date: 09/17/2021    ECHOCARDIOGRAM REPORT   Patient Name:   ANTRONE WALLA  Date of Exam: 09/17/2021 Medical Rec #:  [id number]          Height:       71.0 in Accession #:    [id number]         Weight:       247.1 lb Date of Birth:  [birth year]          BSA:          2.307 m Patient Age:    41 years           BP:           86/67 mmHg Patient Gender: M                  HR:           84 bpm. Exam Location:  Inpatient Procedure: 2D Echo Indications:    dyspnea  History:        Patient has prior history of Echocardiogram examinations, most                 recent 05/02/2020. End stage renal disease; Risk Factors:Diabetes,  Dyslipidemia, Hypertension and Sleep Apnea.  Sonographer:    Tyler Run Referring Phys: [id number] TAYE T GONFA IMPRESSIONS  1. Left ventricular ejection fraction, by estimation, is <20%. The left ventricle has severely decreased function. The left ventricle demonstrates global hypokinesis with significant septal dyskinesis. There is mild left ventricular hypertrophy. Left ventricular diastolic parameters are indeterminate. Elevated left ventricular end-diastolic pressure.  2. Right ventricular systolic function is moderately reduced. The right ventricular size is moderately enlarged. There is mildly elevated pulmonary artery systolic pressure. The estimated right ventricular systolic pressure is 98.1 mmHg.  3. Left atrial size was mildly dilated.  4. There is a trivial pericardial effusion posterior to the left ventricle. Left pleural effusion present.  5. The mitral valve is abnormal. Mild mitral valve regurgitation. Moderate mitral annular calcification.  6. Tricuspid valve regurgitation is severe.  7. The aortic valve is tricuspid. There is severe calcifcation of the aortic valve. Aortic valve regurgitation is not visualized. Possible severe low gradient aortic valve stenosis versus pseudo-stenosis in the setting of markedly reduced LVEF. Aortic valve mean gradient measures 5.0 mmHg. Dimentionless index 0.31.  8. The inferior vena cava is dilated in  size with <50% respiratory variability, suggesting right atrial pressure of 15 mmHg. Comparison(s): Prior images reviewed side by side. LVEF is now markedly reduced with significant septal dyskinesis. Moderate RV dysfunction. Aortic stenosis possible severe range with low gradient although could be pseudo-stenosis in setting of significantly reduced LVEF. FINDINGS  Left Ventricle: Left ventricular ejection fraction, by estimation, is <20%. The left ventricle has severely decreased function. The left ventricle demonstrates global hypokinesis. The left ventricular internal cavity size was normal in size. There is mild left ventricular hypertrophy. Left ventricular diastolic parameters are indeterminate. Elevated left ventricular end-diastolic pressure. Right Ventricle: The right ventricular size is moderately enlarged. No increase in right ventricular wall thickness. Right ventricular systolic function is moderately reduced. There is mildly elevated pulmonary artery systolic pressure. The tricuspid regurgitant velocity is 2.32 m/s, and with an assumed right atrial pressure of 15 mmHg, the estimated right ventricular systolic pressure is 19.1 mmHg. Left Atrium: Left atrial size was mildly dilated. Right Atrium: Right atrial size was normal in size. Pericardium: Trivial pericardial effusion is present. The pericardial effusion is posterior to the left ventricle. Mitral Valve: The mitral valve is abnormal. There is mild thickening of the mitral valve leaflet(s). There is mild calcification of the mitral valve leaflet(s). Moderate mitral annular calcification. Mild mitral valve regurgitation. Tricuspid Valve: The tricuspid valve is grossly normal. Tricuspid valve regurgitation is severe. Aortic Valve: The aortic valve is tricuspid. There is severe calcifcation of the aortic valve. There is moderate aortic valve annular calcification. Aortic valve regurgitation is not visualized. Severe aortic stenosis is present. Aortic  valve mean gradient measures 5.0 mmHg. Aortic valve peak gradient measures 9.6 mmHg. Aortic valve area, by VTI measures 0.79 cm. Pulmonic Valve: The pulmonic valve was grossly normal. Pulmonic valve regurgitation is trivial. Aorta: The aortic root is normal in size and structure. Venous: The inferior vena cava is dilated in size with less than 50% respiratory variability, suggesting right atrial pressure of 15 mmHg. IAS/Shunts: No atrial level shunt detected by color flow Doppler. Additional Comments: There is pleural effusion in the left lateral region.  LEFT VENTRICLE PLAX 2D LVIDd:         5.20 cm     Diastology LVIDs:         4.80 cm     LV e' medial: 3.59 cm/s  LV PW:         1.10 cm LV IVS:        1.10 cm LVOT diam:     1.80 cm LV SV:         22 LV SV Index:   9 LVOT Area:     2.54 cm  LV Volumes (MOD) LV vol d, MOD A4C: 86.3 ml LV vol s, MOD A4C: 77.5 ml LV SV MOD A4C:     86.3 ml RIGHT VENTRICLE            IVC RV S prime:     3.92 cm/s  IVC diam: 3.70 cm LEFT ATRIUM             Index        RIGHT ATRIUM           Index LA diam:        5.10 cm 2.21 cm/m   RA Area:     18.30 cm LA Vol (A2C):   78.0 ml 33.81 ml/m  RA Volume:   52.20 ml  22.63 ml/m LA Vol (A4C):   80.7 ml 34.98 ml/m LA Biplane Vol: 83.3 ml 36.11 ml/m  AORTIC VALVE AV Area (Vmax):    0.86 cm AV Area (Vmean):   0.79 cm AV Area (VTI):     0.79 cm AV Vmax:           155.00 cm/s AV Vmean:          105.000 cm/s AV VTI:            0.271 m AV Peak Grad:      9.6 mmHg AV Mean Grad:      5.0 mmHg LVOT Vmax:         52.30 cm/s LVOT Vmean:        32.600 cm/s LVOT VTI:          0.085 m LVOT/AV VTI ratio: 0.31  AORTA Ao Root diam: 2.80 cm TRICUSPID VALVE TR Peak grad:   21.5 mmHg TR Vmax:        232.00 cm/s  SHUNTS Systemic VTI:  0.08 m Systemic Diam: 1.80 cm Rozann Lesches MD Electronically signed by Rozann Lesches MD Signature Date/Time: 09/17/2021/6:04:34 PM    Final    CT CHEST ABDOMEN PELVIS WO CONTRAST  Result Date: 09/15/2021 CLINICAL  DATA:  Possible sepsis, shortness of breath and abdominal pain, initial encounter EXAM: CT CHEST, ABDOMEN AND PELVIS WITHOUT CONTRAST TECHNIQUE: Multidetector CT imaging of the chest, abdomen and pelvis was performed following the standard protocol without IV contrast. COMPARISON:  09/13/2021 FINDINGS: CT CHEST FINDINGS Cardiovascular: Heart is not significantly enlarged in size. Heavy coronary calcifications are noted. Mild atherosclerotic calcifications of the thoracic aorta are noted. Changes of prior coronary bypass grafting are seen. Left jugular dialysis catheter is noted in satisfactory position. Mediastinum/Nodes: Thoracic inlet is within normal limits. The esophagus is within normal limits. No sizable hilar or mediastinal adenopathy is noted. Lungs/Pleura: Large bilateral pleural effusions are noted right slightly greater than left. These of increased in the interval from the prior exam. Lower lobe consolidation is noted stable from the prior study. No sizable parenchymal nodule is seen. Musculoskeletal: Degenerative changes of the thoracic spine are noted. No acute rib abnormality is seen. No compression deformity is noted. Postsurgical changes in the sternum are seen. CT ABDOMEN PELVIS FINDINGS Hepatobiliary: No focal liver abnormality is seen. Gallbladder is decompressed but stable. Pancreas: Unremarkable. No pancreatic ductal dilatation or surrounding inflammatory  changes. Spleen: Normal in size without focal abnormality. Adrenals/Urinary Tract: Adrenal glands are within normal limits. Kidneys are well visualized bilaterally. No renal calculi or obstructive changes are noted. The bladder is decompressed. Stomach/Bowel: Diverticular changes noted without evidence of diverticulitis. Previously seen postsurgical changes in the sigmoid are less well appreciated on today's exam due to contrast material throughout the sigmoid. More proximal colon is decompressed. No obstructive or inflammatory changes are  seen. The appendix is within normal limits. Vascular/Lymphatic: Aortic atherosclerosis. No enlarged abdominal or pelvic lymph nodes. Reproductive: Prostate is unremarkable. Other: Mild free fluid is noted stable in appearance from the prior exam. Considerable changes of anasarca are noted within the abdominal wall slightly increased when compared with the prior exam. Musculoskeletal: Degenerative changes of lumbar spine are seen. IMPRESSION: CT of the chest: Bilateral pleural effusions right considerably greater than left and increased from the prior examination from 2 days previous. Associated lower lobe consolidation is noted. CT of the abdomen and pelvis: Diverticulosis without diverticulitis. Free fluid within the abdomen stable from the prior exam. Increasing changes of anasarca in the abdominal wall. Aortic Atherosclerosis (ICD10-I70.0). Electronically Signed   By: Inez Catalina M.D.   On: 09/15/2021 20:33   US THORACENTESIS ASP PLEURAL SPACE W/IMG GUIDE  Result Date: 09/16/2021 INDICATION: Symptomatic right-sided sided pleural effusion. Please perform ultrasound-guided thoracentesis for therapeutic purposes. EXAM: US THORACENTESIS ASP PLEURAL SPACE W/IMG GUIDE COMPARISON:  Chest radiograph-09/15/2021; chest CT-09/15/2021 MEDICATIONS: None. COMPLICATIONS: None immediate. TECHNIQUE: Informed written consent was obtained from the patient after a discussion of the risks, benefits and alternatives to treatment. A timeout was performed prior to the initiation of the procedure. Initial ultrasound scanning demonstrates a large anechoic right-sided pleural effusion. Sonographic evaluation of the left chest demonstrated a trace left-sided pleural effusion. As such, decision was made to proceed with right-sided thoracentesis. The inferolateral aspect of the right chest was prepped and draped in the usual sterile fashion. 1% lidocaine was used for local anesthesia. An ultrasound image was saved for documentation  purposes. An 8 Fr Safe-T-Centesis catheter was introduced. The thoracentesis was performed. The catheter was removed and a dressing was applied. The patient tolerated the procedure well without immediate post procedural complication. The patient was escorted to have an upright chest radiograph. FINDINGS: A total of approximately 1.2 liters of serous fluid was removed. IMPRESSION: Successful ultrasound-guided right sided thoracentesis yielding 1.2 liters of pleural fluid. Electronically Signed   By: Sandi Mariscal M.D.   On: 09/16/2021 13:42    Microbiology No results found for this or any previous visit (from the past 240 hour(s)).  Lab Basic Metabolic Panel: No results for input(s): NA, K, CL, CO2, GLUCOSE, BUN, CREATININE, CALCIUM, MG, PHOS in the last 168 hours. Liver Function Tests: No results for input(s): AST, ALT, ALKPHOS, BILITOT, PROT, ALBUMIN in the last 168 hours. No results for input(s): LIPASE, AMYLASE in the last 168 hours. No results for input(s): AMMONIA in the last 168 hours. CBC: No results for input(s): WBC, NEUTROABS, HGB, HCT, MCV, PLT in the last 168 hours. Cardiac Enzymes: No results for input(s): CKTOTAL, CKMB, CKMBINDEX, TROPONINI in the last 168 hours. Sepsis Labs: No results for input(s): PROCALCITON, WBC, LATICACIDVEN in the last 168 hours.    Candee Furbish 10/09/2021, 3:50 PM

## 2021-10-25 NOTE — Progress Notes (Signed)
Pt currently comfort care. Pt removed from Bipap at this time as an adequate seal is no longer able to be achieved. Pt placed on 5L nasal cannula for pt comfort. Family remains at bedside at this time. RT will continue to be available as needed.

## 2021-10-25 NOTE — Progress Notes (Signed)
Patient now more awake, wants everything done.  Full code  Erskine Emery MD PCCM

## 2021-10-25 DEATH — deceased

## 2021-11-08 ENCOUNTER — Encounter: Payer: Medicare Other | Admitting: Cardiology
